# Patient Record
Sex: Female | Born: 1935 | Race: White | Hispanic: No | Smoking: Former smoker
Health system: Southern US, Community
[De-identification: ages and names within clinical notes are randomized; demographics above are authoritative.]

## PROBLEM LIST (undated history)

## (undated) DIAGNOSIS — H905 Unspecified sensorineural hearing loss: Secondary | ICD-10-CM

## (undated) DIAGNOSIS — N183 Chronic kidney disease, stage 3 (moderate): Secondary | ICD-10-CM

## (undated) DIAGNOSIS — Z96641 Presence of right artificial hip joint: Secondary | ICD-10-CM

## (undated) DIAGNOSIS — R634 Abnormal weight loss: Secondary | ICD-10-CM

## (undated) DIAGNOSIS — Z765 Malingerer [conscious simulation]: Secondary | ICD-10-CM

## (undated) DIAGNOSIS — K219 Gastro-esophageal reflux disease without esophagitis: Secondary | ICD-10-CM

## (undated) DIAGNOSIS — E43 Unspecified severe protein-calorie malnutrition: Secondary | ICD-10-CM

## (undated) DIAGNOSIS — M419 Scoliosis, unspecified: Secondary | ICD-10-CM

## (undated) DIAGNOSIS — J449 Chronic obstructive pulmonary disease, unspecified: Secondary | ICD-10-CM

## (undated) DIAGNOSIS — F99 Mental disorder, not otherwise specified: Secondary | ICD-10-CM

## (undated) DIAGNOSIS — Z9181 History of falling: Secondary | ICD-10-CM

## (undated) DIAGNOSIS — J45909 Unspecified asthma, uncomplicated: Secondary | ICD-10-CM

## (undated) DIAGNOSIS — S72001D Fracture of unspecified part of neck of right femur, subsequent encounter for closed fracture with routine healing: Secondary | ICD-10-CM

## (undated) DIAGNOSIS — F32A Depression, unspecified: Secondary | ICD-10-CM

## (undated) DIAGNOSIS — F329 Major depressive disorder, single episode, unspecified: Secondary | ICD-10-CM

## (undated) DIAGNOSIS — R1310 Dysphagia, unspecified: Secondary | ICD-10-CM

## (undated) DIAGNOSIS — H3530 Unspecified macular degeneration: Secondary | ICD-10-CM

## (undated) DIAGNOSIS — N184 Chronic kidney disease, stage 4 (severe): Secondary | ICD-10-CM

## (undated) DIAGNOSIS — E559 Vitamin D deficiency, unspecified: Secondary | ICD-10-CM

## (undated) DIAGNOSIS — Z9889 Other specified postprocedural states: Secondary | ICD-10-CM

## (undated) DIAGNOSIS — R42 Dizziness and giddiness: Secondary | ICD-10-CM

## (undated) DIAGNOSIS — H409 Unspecified glaucoma: Secondary | ICD-10-CM

## (undated) DIAGNOSIS — E539 Vitamin B deficiency, unspecified: Secondary | ICD-10-CM

## (undated) DIAGNOSIS — G4700 Insomnia, unspecified: Secondary | ICD-10-CM

## (undated) DIAGNOSIS — S022XXA Fracture of nasal bones, initial encounter for closed fracture: Secondary | ICD-10-CM

## (undated) DIAGNOSIS — F419 Anxiety disorder, unspecified: Secondary | ICD-10-CM

## (undated) DIAGNOSIS — G43909 Migraine, unspecified, not intractable, without status migrainosus: Secondary | ICD-10-CM

## (undated) DIAGNOSIS — K6289 Other specified diseases of anus and rectum: Secondary | ICD-10-CM

## (undated) DIAGNOSIS — M810 Age-related osteoporosis without current pathological fracture: Secondary | ICD-10-CM

## (undated) DIAGNOSIS — K529 Noninfective gastroenteritis and colitis, unspecified: Secondary | ICD-10-CM

## (undated) DIAGNOSIS — F1910 Other psychoactive substance abuse, uncomplicated: Secondary | ICD-10-CM

## (undated) DIAGNOSIS — Z8781 Personal history of (healed) traumatic fracture: Secondary | ICD-10-CM

## (undated) DIAGNOSIS — K222 Esophageal obstruction: Secondary | ICD-10-CM

## (undated) DIAGNOSIS — I771 Stricture of artery: Secondary | ICD-10-CM

## (undated) HISTORY — DX: Unspecified severe protein-calorie malnutrition: E43

## (undated) HISTORY — DX: Anxiety disorder, unspecified: F41.9

## (undated) HISTORY — DX: Chronic kidney disease, stage 3 (moderate): N18.3

## (undated) HISTORY — DX: Vitamin D deficiency, unspecified: E55.9

## (undated) HISTORY — DX: Age-related osteoporosis without current pathological fracture: M81.0

## (undated) HISTORY — DX: Mental disorder, not otherwise specified: F99

## (undated) HISTORY — DX: Gastro-esophageal reflux disease without esophagitis: K21.9

## (undated) HISTORY — DX: Unspecified glaucoma: H40.9

## (undated) HISTORY — DX: Insomnia, unspecified: G47.00

## (undated) HISTORY — DX: Other specified diseases of anus and rectum: K62.89

## (undated) HISTORY — DX: Depression, unspecified: F32.A

## (undated) HISTORY — DX: History of falling: Z91.81

## (undated) HISTORY — DX: Unspecified sensorineural hearing loss: H90.5

## (undated) HISTORY — DX: Scoliosis, unspecified: M41.9

## (undated) HISTORY — DX: Abnormal weight loss: R63.4

## (undated) HISTORY — DX: Migraine, unspecified, not intractable, without status migrainosus: G43.909

## (undated) HISTORY — DX: Fracture of unspecified part of neck of right femur, subsequent encounter for closed fracture with routine healing: S72.001D

## (undated) HISTORY — PX: CATARACT EXTRACTION: SUR2

## (undated) HISTORY — DX: Vitamin B deficiency, unspecified: E53.9

## (undated) HISTORY — DX: Personal history of (healed) traumatic fracture: Z87.81

## (undated) HISTORY — DX: Malingerer (conscious simulation): Z76.5

## (undated) HISTORY — DX: Other specified postprocedural states: Z98.890

## (undated) HISTORY — PX: ABDOMINAL HYSTERECTOMY: SHX81

## (undated) HISTORY — DX: Unspecified macular degeneration: H35.30

## (undated) HISTORY — DX: Other psychoactive substance abuse, uncomplicated: F19.10

## (undated) HISTORY — DX: Major depressive disorder, single episode, unspecified: F32.9

## (undated) HISTORY — PX: CHOLECYSTECTOMY: SHX55

## (undated) HISTORY — DX: Presence of right artificial hip joint: Z96.641

## (undated) HISTORY — PX: PARTIAL HYSTERECTOMY: SHX80

## (undated) HISTORY — DX: Chronic obstructive pulmonary disease, unspecified: J44.9

---

## 1997-06-11 ENCOUNTER — Other Ambulatory Visit: Admission: RE | Admit: 1997-06-11 | Discharge: 1997-06-11 | Payer: Self-pay | Admitting: Family Medicine

## 1997-06-21 ENCOUNTER — Other Ambulatory Visit: Admission: RE | Admit: 1997-06-21 | Discharge: 1997-06-21 | Payer: Self-pay | Admitting: Family Medicine

## 1998-07-26 ENCOUNTER — Other Ambulatory Visit: Admission: RE | Admit: 1998-07-26 | Discharge: 1998-07-26 | Payer: Self-pay | Admitting: Family Medicine

## 2000-05-03 ENCOUNTER — Encounter: Admission: RE | Admit: 2000-05-03 | Discharge: 2000-05-03 | Payer: Self-pay | Admitting: Family Medicine

## 2000-05-03 ENCOUNTER — Encounter: Payer: Self-pay | Admitting: Family Medicine

## 2000-05-21 ENCOUNTER — Other Ambulatory Visit: Admission: RE | Admit: 2000-05-21 | Discharge: 2000-05-21 | Payer: Self-pay | Admitting: *Deleted

## 2001-09-05 ENCOUNTER — Encounter: Admission: RE | Admit: 2001-09-05 | Discharge: 2001-09-05 | Payer: Self-pay | Admitting: Endocrinology

## 2001-09-05 ENCOUNTER — Encounter: Payer: Self-pay | Admitting: Endocrinology

## 2002-03-25 ENCOUNTER — Encounter: Admission: RE | Admit: 2002-03-25 | Discharge: 2002-06-23 | Payer: Self-pay | Admitting: Dermatology

## 2002-06-09 ENCOUNTER — Encounter: Admission: RE | Admit: 2002-06-09 | Discharge: 2002-06-09 | Payer: Self-pay | Admitting: Sports Medicine

## 2002-06-09 ENCOUNTER — Encounter: Payer: Self-pay | Admitting: Sports Medicine

## 2002-06-23 ENCOUNTER — Encounter: Admission: RE | Admit: 2002-06-23 | Discharge: 2002-06-23 | Payer: Self-pay | Admitting: Sports Medicine

## 2002-06-23 ENCOUNTER — Encounter: Payer: Self-pay | Admitting: Sports Medicine

## 2002-07-29 ENCOUNTER — Encounter: Admission: RE | Admit: 2002-07-29 | Discharge: 2002-07-29 | Payer: Self-pay | Admitting: Sports Medicine

## 2002-07-29 ENCOUNTER — Encounter: Payer: Self-pay | Admitting: Sports Medicine

## 2003-02-28 ENCOUNTER — Emergency Department (HOSPITAL_COMMUNITY): Admission: EM | Admit: 2003-02-28 | Discharge: 2003-02-28 | Payer: Self-pay | Admitting: Emergency Medicine

## 2003-08-04 ENCOUNTER — Encounter: Admission: RE | Admit: 2003-08-04 | Discharge: 2003-08-04 | Payer: Self-pay | Admitting: Internal Medicine

## 2005-03-04 ENCOUNTER — Emergency Department (HOSPITAL_COMMUNITY): Admission: EM | Admit: 2005-03-04 | Discharge: 2005-03-04 | Payer: Self-pay | Admitting: Emergency Medicine

## 2006-04-29 ENCOUNTER — Emergency Department (HOSPITAL_COMMUNITY): Admission: EM | Admit: 2006-04-29 | Discharge: 2006-04-29 | Payer: Self-pay | Admitting: Emergency Medicine

## 2006-11-18 ENCOUNTER — Inpatient Hospital Stay (HOSPITAL_COMMUNITY): Admission: EM | Admit: 2006-11-18 | Discharge: 2006-11-20 | Payer: Self-pay | Admitting: Emergency Medicine

## 2006-11-30 ENCOUNTER — Observation Stay (HOSPITAL_COMMUNITY): Admission: EM | Admit: 2006-11-30 | Discharge: 2006-12-01 | Payer: Self-pay | Admitting: Emergency Medicine

## 2007-03-27 ENCOUNTER — Encounter: Admission: RE | Admit: 2007-03-27 | Discharge: 2007-03-27 | Payer: Self-pay | Admitting: Internal Medicine

## 2007-04-05 ENCOUNTER — Emergency Department (HOSPITAL_COMMUNITY): Admission: EM | Admit: 2007-04-05 | Discharge: 2007-04-05 | Payer: Self-pay | Admitting: Emergency Medicine

## 2008-04-09 ENCOUNTER — Emergency Department (HOSPITAL_COMMUNITY): Admission: EM | Admit: 2008-04-09 | Discharge: 2008-04-09 | Payer: Self-pay | Admitting: Emergency Medicine

## 2009-02-27 ENCOUNTER — Emergency Department (HOSPITAL_COMMUNITY): Admission: EM | Admit: 2009-02-27 | Discharge: 2009-02-27 | Payer: Self-pay | Admitting: Emergency Medicine

## 2009-03-08 ENCOUNTER — Encounter: Admission: RE | Admit: 2009-03-08 | Discharge: 2009-03-08 | Payer: Self-pay | Admitting: Emergency Medicine

## 2009-04-03 ENCOUNTER — Emergency Department (HOSPITAL_COMMUNITY): Admission: EM | Admit: 2009-04-03 | Discharge: 2009-04-03 | Payer: Self-pay | Admitting: Emergency Medicine

## 2009-04-05 ENCOUNTER — Emergency Department (HOSPITAL_COMMUNITY): Admission: EM | Admit: 2009-04-05 | Discharge: 2009-04-05 | Payer: Self-pay | Admitting: Emergency Medicine

## 2009-06-10 ENCOUNTER — Emergency Department (HOSPITAL_COMMUNITY): Admission: EM | Admit: 2009-06-10 | Discharge: 2009-06-10 | Payer: Self-pay | Admitting: Family Medicine

## 2009-06-21 ENCOUNTER — Emergency Department (HOSPITAL_COMMUNITY): Admission: EM | Admit: 2009-06-21 | Discharge: 2009-06-21 | Payer: Self-pay | Admitting: Emergency Medicine

## 2010-03-05 ENCOUNTER — Encounter: Payer: Self-pay | Admitting: Internal Medicine

## 2010-04-30 LAB — URINALYSIS, ROUTINE W REFLEX MICROSCOPIC
Glucose, UA: NEGATIVE mg/dL
Hgb urine dipstick: NEGATIVE
Ketones, ur: NEGATIVE mg/dL
Nitrite: NEGATIVE
Protein, ur: NEGATIVE mg/dL
pH: 6.5 (ref 5.0–8.0)

## 2010-04-30 LAB — DIFFERENTIAL
Basophils Absolute: 0 10*3/uL (ref 0.0–0.1)
Eosinophils Absolute: 0.1 10*3/uL (ref 0.0–0.7)
Eosinophils Relative: 2 % (ref 0–5)
Lymphocytes Relative: 12 % (ref 12–46)
Lymphs Abs: 0.9 10*3/uL (ref 0.7–4.0)
Monocytes Relative: 9 % (ref 3–12)
Neutro Abs: 5.7 10*3/uL (ref 1.7–7.7)
Neutrophils Relative %: 77 % (ref 43–77)

## 2010-04-30 LAB — CBC
Hemoglobin: 14.8 g/dL (ref 12.0–15.0)
RBC: 4.68 MIL/uL (ref 3.87–5.11)
WBC: 7.4 10*3/uL (ref 4.0–10.5)

## 2010-04-30 LAB — BASIC METABOLIC PANEL
GFR calc non Af Amer: >60 mL/min (ref >60)
Glucose, Bld: 92 mg/dL (ref 70–99)
Potassium: 3.9 mEq/L (ref 3.5–5.1)
Sodium: 141 mEq/L (ref 135–145)

## 2010-05-03 LAB — POCT I-STAT, CHEM 8
BUN: 22 mg/dL (ref 6–23)
Creatinine, Ser: 0.8 mg/dL (ref 0.4–1.2)
HCT: 43 % (ref 36.0–46.0)
Hemoglobin: 14.6 g/dL (ref 12.0–15.0)
Potassium: 4.1 mEq/L (ref 3.5–5.1)
Sodium: 139 mEq/L (ref 135–145)

## 2010-05-30 LAB — POCT CARDIAC MARKERS
CKMB, poc: <1 ng/mL — ABNORMAL LOW (ref 1.0–8.0)
Myoglobin, poc: 45.3 ng/mL (ref 12–200)

## 2010-05-30 LAB — DIFFERENTIAL
Basophils Absolute: 0 10*3/uL (ref 0.0–0.1)
Eosinophils Absolute: 0.1 10*3/uL (ref 0.0–0.7)
Eosinophils Relative: 1 % (ref 0–5)
Neutro Abs: 6.9 10*3/uL (ref 1.7–7.7)
Neutrophils Relative %: 75 % (ref 43–77)

## 2010-05-30 LAB — CBC
HCT: 39.5 % (ref 36.0–46.0)
Platelets: 137 10*3/uL — ABNORMAL LOW (ref 150–400)
RDW: 13.8 % (ref 11.5–15.5)

## 2010-05-30 LAB — POCT I-STAT, CHEM 8
BUN: 28 mg/dL — ABNORMAL HIGH (ref 6–23)
Calcium, Ion: 1.08 mmol/L — ABNORMAL LOW (ref 1.12–1.32)
TCO2: 27 mmol/L (ref 0–100)

## 2010-06-27 NOTE — Discharge Summary (Signed)
[name], [name]:  [id number]   MEDICAL RECORD NO.:  [id number]          PATIENT TYPE:  INP   LOCATION:  [id number]                         FACILITY:  [facility]   PHYSICIAN:  Rexene Alberts, M.D.    DATE OF BIRTH:  [birth year]   DATE OF ADMISSION:  11/18/2006  DATE OF DISCHARGE:  11/20/2006                               DISCHARGE SUMMARY   DISCHARGE DIAGNOSES:  1. Acute gastroenteritis.  2. Hypovolemic shock.  3. Fever, resolved during the hospitalization.  4. Reported history of episodic rectal bleeding although mild.  5. Acute renal insufficiency.  6. Polypharmacy.   DISCHARGE MEDICATIONS:  1. Cipro 500 mg b.i.d. for 3 days.  2. Vicoprofen 7.5/200 mg b.i.d. as needed (do not take if you are      taking tramadol and Ascomp with codeine).  3. Tranxene 7.5 mg t.i.d. p.r.n.  4. Lidoderm patch 5% add to affected skin for 12 hours and then off      for 12 hours.  5. Tramadol 50 mg every 6 hours as needed (do not take if you are      Vicoprofen and Ascomp with codeine).  6. Combivent MDI 2 puffs q.i.d. p.r.n.  7. Nitroglycerin 0.4 mg sublingual p.r.n.  8. Lasix 20 mg p.r.n.  9. Triazolam 0.25 mg p.r.n. (do not take if your taking Remeron and      Surmontil).  10.Zovirax 5% topical to the skin as needed.  11.Depo-Estradiol 5 mg monthly injection.  12.Surmontil 50 mg once daily or b.i.d. (decreased from t.i.d.).  13.Allegra 60 mg daily as needed.  14.Flovent inhaler 2 puffs as needed.  15.Remeron 45 mg daily.  16.Zantac 150 mg b.i.d.  17.Ascomp with codeine as needed (do not take if you are taking      Vicoprofen and tramadol).  18.Wellbutrin 150 mg daily.  19.Phenergan 25 mg every 4-6 hours as needed.  20.Vigamox eyedrops 0.5% 1 drop in the left eye t.i.d.  21.Omnipred 0.1% eye 1 drop in the left eye t.i.d.  22.Xibrom eyedrops 0.09% 1 drop in the left eye t.i.d.   DISCHARGE DISPOSITION:  The patient was discharged to home in improved  and stable condition  on November 20, 2006.  She was advised to follow up  with her primary care physician, Dr. Mellody Drown, in 5-7 days.   CONSULTATIONS:  None.   PROCEDURE PERFORMED:  1. Acute abdominal series on November 18, 2006.  The results revealed a      nonspecific bowel gas pattern without evidence for obstruction.      Air-filled loops of small bowel may reflect enteritis.  2. Chest x-ray:  The results revealed no evidence for pneumonia.      COPD/emphysema.   HISTORY OF PRESENT ILLNESS:  The patient is a 77 year old woman with a  past medical history significant for COPD, chronic pain, depression, and  osteoporosis.  She presented to the emergency department on November 18, 2006 with a chief complaint of nausea, vomiting, fever, chills, and  generalized malaise.  She noted that she had eaten at [facility] earlier in the day.  She vomited a large amount of emesis in a  trash can at home.  When the patient arrived to the emergency  department, she was noted to be febrile with a temperature of 102.8.  The patient was therefore admitted for further evaluation and  management.   For additional details please see the dictated history and physical.   HOSPITAL COURSE:  #1.  ACUTE GASTROENTERITIS/HYPOVOLEMIC SHOCK.  At the  time of the initial hospital assessment, the patient's blood pressure  was 100/64.  However, her blood pressure fell into the 123XX123 systolically  overnight.  She was transferred to the ICU and started on dopamine.  Blood cultures were ordered and she was started on Flagyl and Cipro  empirically. In addition, an urinalysis and subsequently an urine  culture were both ordered.  The urinalysis revealed no evidence of  infection.  The urine culture has been negative thus far.  As previously  stated, blood cultures were ordered and as of today, on hospital day #3,  they have been negative.   After approximately 24 hours on dopamine, the patient's blood pressure  stabilized into the  low 100s.  The dopamine was titrated off and the  patient was continued on IV fluids for blood pressure support.  It is  also important to note, that the patient had been started on Flagyl and  Rocephin.  However within 24 hours of starting these antibiotics, they  were discontinued. As stated above, Cipro and then vancomycin were  initiated instead.  Given the patient's hypotension, her TSH, and a  random blood cortisol were both assessed.  Her TSH was within normal  limits at 0.391. Her free T4 was 0.88. Her blood cortisol was 5.5 in the  afternoon repeated later at 16.0, both within normal limits.   On hospital day #2 and #3, the patient felt symptomatically better and  she appeared to be clinically improved.  A clear liquid diet was started  followed by a heart healthy diet. The patient not only tolerated the  clear liquid diet but she tolerated the heart healthy diet as well.  She  requests to go home today.  Currently her blood pressure is stable,  ranging from the upper 90s to low 100s.  She ambulated with the nursing  staff without any complaints of dizziness.  She has had no further  nausea or vomiting during the hospital course, particularly following  intake of solids.  The patient will remain on empiric antibiotic  treatment for an additional 3 days namely Cipro 500 mg b.i.d. In  addition, the patient was advised to take Phenergan 25 mg as needed  every 6 hours.   #2.  POLYPHARMACY. The patient takes many medications, although she  states that most of them she takes as needed.  There is certainly a risk  for drug drug interaction with the number of medications she takes.  For  instance, she takes a medication with codeine as needed as well as  tramadol, and as needed by Vicoprofen. The patient was advised not to  take all of these medications simultaneously or even in the same day.  She was also advised to decrease the Surmontil to daily or b.i.d.  instead of t.i.d.  This  recommendation was given because the patient is  also  currently taking Wellbutrin and Remeron.  She was also advised to  discontinue Triazolam as she is also taking Tranxene.  The patient was  strongly advised to discuss these multiple medications and potential  drug drug interactions with her primary care physician, Dr. Mellody Drown. She  voiced understanding.      Rexene Alberts, M.D.  Electronically Signed     DF/MEDQ  D:  11/20/2006  T:  11/21/2006  Job:  [id number]   cc:   Jilda Panda, M.D.  Fax: [phone]

## 2010-06-27 NOTE — H&P (Signed)
Taylor Roth, [name]:  [id number]   MEDICAL RECORD NO.:  [id number]          PATIENT TYPE:  OBV   LOCATION:  1534                         FACILITY:  [facility]   PHYSICIAN:  Leana Gamer, MDDATE OF BIRTH:  [birth year]   DATE OF ADMISSION:  11/30/2006  DATE OF DISCHARGE:  12/01/2006                              HISTORY & PHYSICAL   ,   CHIEF COMPLAINT:  Nightly sweats and feeling poorly.   HISTORY OF PRESENT ILLNESS:  This is a 77 year old lady who presented to  the emergency room with complaints of sweats at night which precipitated  her coming to the emergency room.  The patient states she has no  dizziness, no fever, no nausea, vomiting or diarrhea.  The patient was  evaluated by the emergency room physician, Dr. Tomi Bamberger, whois  assymptomatic but was found to be orthostatic and advised the patient  that she needed to be admitted.  I am here to see the patient.  The  patient has several medications that can cause orthostasis, which is  likely the cause and probably does not require admission to the  hospital, however, in terms of the patient's sweats, she states that she  has been seen by her primary care physician, Dr. Mellody Drown, who advised  her that it is likely due to the fact that she is due for her Depo-  Estradiol.  However, the patient and her daughter have been advised by  the emergency room physician that she requires admission and the  patient's daughter is insisting that she be admitted, thus per patient's  insistence and family's refusal to take the patient home, she is being  admission on an observation basis.   PAST MEDICAL HISTORY:  Significant for osteoporosis, depression, chronic  obstructive pulmonary disease, chronic pain and allergic rhinitis.   SOCIAL HISTORY:  The patient lives alone.  She denies any tobacco,  alcohol or drug use.   CURRENT MEDICATIONS:  Her current medications include the following:  1. Surmontil 50 mg p.o.  t.i.d.  2. Depo-Estradiol 5 mg as scheduled by primary care physician.  3. Remeron 45 mg p.o. q.h.s.  4. Zantac 150 mg p.o. b.i.d.  5. Ascomp with Codeine p.r.n. for migraines.  6. Lactulose 10 mg p.o. daily.  7. Wellbutrin 150 mg p.o. daily.  8. Vigamox 0.5% one drop to left eye t.i.d.  9. Omnipred 0.1% drops, one drop to each eye t.i.d.  10.Xibrom 0.09% one drop to left eye t.i.d.  11.Vicoprofen 7.5/200 p.o. b.i.d.  12.Tranxene T 7.5 mg p.o. q.i.d.  13.Combivent two puffs daily p.r.n.  14.Nitroglycerin 0.4 mg sublingual q.5 minutes x3 p.r.n.  15.Lasix 20 mg daily p.r.n.  16.Halcion 0.25 mg p.o. q.h.s.  17.Allegra 60 mg p.o. t.i.d.   ALLERGIES:  OXYCODONE, PENICILLIN, SULFONAMIDES.   PRIMARY CARE PHYSICIAN:  Lauraine Rinne, M.D.   REVIEW OF SYSTEMS:  Fourteen systems review:  All systems are negative  except as noted in the HPI.   PHYSICAL EXAMINATION:  GENERAL APPEARANCE:  The patient is lying in bed.  She appears in no acute distress and actually well-appearing.  The  patient has no complaints at this time.  VITAL SIGNS:  Orthostatic's:  In a supine position blood pressure is  104/61, heart rate 67;  in a sitting position the blood pressure is  100/57, heart rate 68;  in standing position the blood pressure is 99/60  and heart rate is 72.  HEENT:  Patient is normocephalic, atraumatic.  Pupils equal, round,  reactive to light and accommodation.  Extraocular movements are intact.  Tympanic membranes are translucent bilaterally with good landmarks.  Oropharynx is moist and no exudate, erythema or lesions are noted.  NECK:  Trachea is midline.  No masses.  No thyromegaly.  No jugular  venous distention.  No carotid bruits.  RESPIRATORY EXAM:  The patient has a normal respiratory effort; equal  excursion bilaterally.  She has no wheezing or rhonchi noted.  CARDIOVASCULAR:  She has normal S1 and S2. No murmurs, rubs or gallops  noted.  PMI is nondisplaced.  No heaves or  thrills on palpation.  ABDOMEN:  On exam the abdomen is obese, soft, nontender,  nondistended.  No masses, no hepatosplenomegaly noted.  LYMPH NODES SURVEY:  She has no cervical, axillary, inguinal  lymphadenopathy noted.  NEUROLOGICAL:  Patient has  no focal neurological deficits.  Cranial  nerves II-XII are grossly intact.  Strength is symmetrical, 4/5 in  bilateral upper and lower extremities.  Deep tendon reflexes are  symmetrical, 2+ bilaterally in upper and lower extremities.  Sensation  is intact to light touch, pinprick and proprioception.  MUSCULOSKELETAL:  She has no warmness, swelling or erythema around the  joints.  No spinal tenderness noted.  PSYCHIATRIC:  She is alert and oriented x3.  Good insight and cognition.  Good recent and remote recall.   ASSESSMENT/PLAN:  This patient presents with orthostasis, likely  medication effect.  It appears that the orthostasis does not effect the  patient functionally.  However, the patient's family is insistent, at  the advice of the emergency room physician, that she be admitted to the  hospital, thus the patient is being brought in on an outpatient basis.  For this patient, at this time, I am going to hold the medications that  predispose the patient to orthostatic hypotension, which include  Tranxene, Surmontil and Remeron.  She will be continued on her usual  medications.      Leana Gamer, MD  Electronically Signed     MAM/MEDQ  D:  11/30/2006  T:  12/02/2006  Job:  [id number]   cc:   Lauraine Rinne, MD

## 2010-06-27 NOTE — H&P (Signed)
Taylor Roth, SHUFFORD NO.:  [id number]   MEDICAL RECORD NO.:  [id number]          PATIENT TYPE:  INP   LOCATION:  1226                         FACILITY:  [facility]   PHYSICIAN:  Jana Hakim, M.D. DATE OF BIRTH:  1928/09/12   DATE OF ADMISSION:  11/18/2006  DATE OF DISCHARGE:                              HISTORY & PHYSICAL   PRIMARY CARE PHYSICIAN:  Dr. Jilda Panda.   CHIEF COMPLAINT:  Fever, nausea, vomiting.   HISTORY OF PRESENT ILLNESS:  This is a 77 year old female with a history  of COPD/emphysema presenting to the emergency department with complaints  of nausea, vomiting and fevers and chills which started at approximately  10:00 p.m. prior to admission.  She reports having an elevated fever and  was found at the time arrival in the emergency department to have a  temperature 102.8.  She denies having any abdominal pain and diarrhea.  Her daughter reports that patient had large volume vomiting into a trash  can.  She reports this material was food like.  The patient reports her  last meal was at noon, and this consisted of [employer],  slaw and mashed potatoes.  She also reports drinking a can of Ensure  Plus later in the afternoon.  The patient reports having an episode of  rectal bleeding in the a.m. and reports this occurs off and on but not  consistently.   PAST MEDICAL HISTORY:  Significant for:  1. Emphysema.  2. Osteoporosis.  3. Peptic ulcer disease.  4. Hiatal hernia.   PAST SURGICAL HISTORY:  1. History of cholecystectomy.  2. Total abdominal hysterectomy.  3. Cataract surgery of the left eye.   MEDICATIONS:  Include:  1. Vicoprofen 1 p.o. q.6h. p.r.n.  2. Tranxene 7.5 mg 1 p.o. q.i.d.  3. Tramadol 1 p.o. t.i.d. p.r.n.  4. Triazolam 0.25 mg 1 p.o. every night.  5. Combivent inhaler b.i.d. p.r.n.  6. Surmontil 50 mg 1 p.o. t.i.d.  7. Allegra 60 mg 1 p.o. t.i.d.  8. Flovent inhaler 110 mcg 1 inhalation b.i.d.  9. Remeron  45 mg 1 p.o. every night.  10.Zantac 150 mg 1 p.o. q.a.m.  11.Ascomp with codeine 1 p.o. q.i.d. p.r.n.  12.Lactulose 15 mL p.o. every night.  13.Wellbutrin 150 mg 1 p.o. daily.  14.Xopenex inhaler 2 puffs q.i.d. p.r.n.  15.Zovirax 5% ointments p.r.n.  16.Nitroglycerin sublingual 0.4 mg every 5 minutes x3 p.r.n. chest      pain.  17.Lidoderm patch 5% p.r.n. pain.  18.B12 shots monthly.  19.Depo-Estradiol injections 5 mg monthly.   ALLERGIES:  TO PENICILLIN AND SULFA WHICH BOTH CAUSE RASHES.   SOCIAL HISTORY:  The patient lives alone.  Daughter is nearby.  Positive  tobacco, smoked 1 pack per day for 40 years.  No history of alcohol.   FAMILY HISTORY:  Is noncontributory.   REVIEW OF SYSTEMS:  Pertinents are mentioned above.  The patient denies  having any melena, any syncope.  She does report having weakness.  Denies dizziness.   PHYSICAL EXAMINATION FINDINGS:  This is an ill, elderly-appearing, 54-  year-old, thin female in  discomfort but no acute distress.  Temperature  is 102.8, blood pressure 100/64; however, this did decrease to systolic  of 123456, heart rate 103 initially, but this ranged from 68-103,  respirations 28-24, O2 saturations 96-98%.  HEENT EXAMINATION:  Normocephalic, atraumatic.  There is no scleral  icterus.  Pupils are equally round, reactive to light.  Extraocular  muscles are intact.  Funduscopic benign.  Oropharynx clear.  NECK:  Is supple.  Full range of motion.  No thyromegaly, adenopathy,  jugular venous distention.  CARDIOVASCULAR:  Regular rate and rhythm.  No murmurs, gallops or rubs.  LUNGS:  Clear to auscultation bilaterally.  ABDOMEN:  Positive bowel sounds, soft, nontender, nondistended.  EXTREMITIES:  Without cyanosis, clubbing or edema.  NEUROLOGIC EXAMINATION:  Mild generalized weakness.  The patient is  alert and oriented x3.  There are no focal deficits.   LABORATORY STUDIES:  White blood cell count 12.4, hemoglobin 14.6,  hematocrit  43.1, MCV 93.5, platelets 189, neutrophils 82%, lymphocytes  11%.  Sodium 140, potassium 3.3, chloride 103, carbon dioxide 29, BUN  24, creatinine 0.81, glucose 97, calcium level 8.4.  Chest x-ray reveals  COPD changes.  Urinalysis negative.   ASSESSMENT:  A 77 year old female being admitted with:  1. Acute gastroenteritis.  2. Hypotension.  3. Dehydration.  4. Febrile illness.  5. Mild hypokalemia.  6. Episodic rectal bleeding.   PLAN:  The patient will be admitted to a telemetry area for cardiac  monitoring.  She will be placed on IV fluids for fluid resuscitation and  rehydration.  Blood cultures have been sent, and antiemetics have been  administered.  They will also be ordered p.r.n.  Potassium will be  replaced.  The patient had been administered Rocephin 1 gram IV in the  emergency department as empiric treatment.  Ciprofloxacin and Flagyl  have been ordered instead.  The patient will have abdominal x-rays  performed, a 3-way abdomen, to look for evidence of an ileus or signs of  a obstruction.  GI prophylaxis has been ordered, and TED hose has been ordered for DVT  prophylaxis.  The patient's hemoglobin and hematocrit levels will be  monitored, and a GI consultation will be considered whether to be  performed as an inpatient or as an outpatient.      Jana Hakim, M.D.  Electronically Signed     HJ/MEDQ  D:  11/18/2006  T:  11/19/2006  Job:  [id number]   cc:   Jilda Panda, M.D.  Fax: [phone]

## 2010-08-10 ENCOUNTER — Other Ambulatory Visit: Payer: Self-pay | Admitting: Emergency Medicine

## 2010-08-24 ENCOUNTER — Other Ambulatory Visit: Payer: Self-pay

## 2010-11-22 LAB — COMPREHENSIVE METABOLIC PANEL
ALT: 11
AST: 24
Alkaline Phosphatase: 58
CO2: 27
Chloride: 104
GFR calc Af Amer: >60
Potassium: 4
Sodium: 140
Total Bilirubin: 0.4

## 2010-11-22 LAB — URINALYSIS, ROUTINE W REFLEX MICROSCOPIC
Bilirubin Urine: NEGATIVE
Glucose, UA: NEGATIVE
Hgb urine dipstick: NEGATIVE
Ketones, ur: NEGATIVE
Nitrite: NEGATIVE

## 2010-11-22 LAB — POCT CARDIAC MARKERS: CKMB, poc: <1 — ABNORMAL LOW

## 2010-11-22 LAB — DIFFERENTIAL
Basophils Absolute: 0.1
Basophils Relative: 2 — ABNORMAL HIGH
Eosinophils Absolute: 0.1
Eosinophils Relative: 2
Lymphocytes Relative: 32

## 2010-11-22 LAB — CBC
HCT: 38.1
Platelets: 247
RBC: 4.1
WBC: 5.5

## 2010-11-22 LAB — URINE MICROSCOPIC-ADD ON

## 2010-11-23 LAB — DIFFERENTIAL
Basophils Absolute: 0
Basophils Absolute: 0
Basophils Absolute: 0.1
Basophils Relative: 0
Basophils Relative: 0
Eosinophils Absolute: 0.1
Eosinophils Absolute: 0.1
Eosinophils Absolute: 0.1
Eosinophils Relative: 1
Eosinophils Relative: 1
Lymphocytes Relative: 11 — ABNORMAL LOW
Lymphocytes Relative: 22
Lymphs Abs: 1.4
Monocytes Absolute: 0.7
Monocytes Absolute: 0.8 — ABNORMAL HIGH
Neutro Abs: 10.2 — ABNORMAL HIGH
Neutrophils Relative %: 73

## 2010-11-23 LAB — BASIC METABOLIC PANEL
CO2: 22
CO2: 29
Calcium: 7.8 — ABNORMAL LOW
Calcium: 8.4
Chloride: 103
Chloride: 115 — ABNORMAL HIGH
Creatinine, Ser: 0.81
GFR calc Af Amer: >60
Glucose, Bld: 97
Potassium: 3.5
Sodium: 140
Sodium: 142

## 2010-11-23 LAB — CBC
HCT: 33.6 — ABNORMAL LOW
HCT: 35.7 — ABNORMAL LOW
HCT: 35.9 — ABNORMAL LOW
Hemoglobin: 11.7 — ABNORMAL LOW
Hemoglobin: 12.2
Hemoglobin: 14.6
MCHC: 33.8
MCHC: 34
MCHC: 34.8
MCV: 92.8
MCV: 93.5
MCV: 93.5
MCV: 93.8
Platelets: 180
Platelets: 193
RBC: 3.59 — ABNORMAL LOW
RBC: 4.61
RDW: 14.7 — ABNORMAL HIGH
RDW: 15.4 — ABNORMAL HIGH
RDW: 15.5 — ABNORMAL HIGH

## 2010-11-23 LAB — HEMOGLOBIN AND HEMATOCRIT, BLOOD
HCT: 32.8 — ABNORMAL LOW
Hemoglobin: 11.3 — ABNORMAL LOW
Hemoglobin: 12.3

## 2010-11-23 LAB — COMPREHENSIVE METABOLIC PANEL
ALT: 16
Alkaline Phosphatase: 53
BUN: 5 — ABNORMAL LOW
CO2: 25
Calcium: 7.4 — ABNORMAL LOW
GFR calc non Af Amer: >60
Glucose, Bld: 127 — ABNORMAL HIGH
Sodium: 140

## 2010-11-23 LAB — CULTURE, BLOOD (ROUTINE X 2): Culture: NO GROWTH

## 2010-11-23 LAB — URINE CULTURE
Colony Count: NO GROWTH
Special Requests: NEGATIVE

## 2010-11-23 LAB — CORTISOL: Cortisol, Plasma: 4.4

## 2010-11-23 LAB — URINALYSIS, ROUTINE W REFLEX MICROSCOPIC
Nitrite: NEGATIVE
Specific Gravity, Urine: 1.019
Urobilinogen, UA: 0.2
pH: 6

## 2010-11-23 LAB — CARDIAC PANEL(CRET KIN+CKTOT+MB+TROPI)
Relative Index: INVALID
Troponin I: 0.03

## 2010-11-23 LAB — MAGNESIUM: Magnesium: 1.7

## 2010-11-23 LAB — T4, FREE: Free T4: 0.88 — ABNORMAL LOW

## 2010-11-23 LAB — TSH: TSH: 0.391

## 2011-02-20 ENCOUNTER — Ambulatory Visit (INDEPENDENT_AMBULATORY_CARE_PROVIDER_SITE_OTHER): Payer: Medicare Other | Admitting: Emergency Medicine

## 2011-02-20 DIAGNOSIS — D234 Other benign neoplasm of skin of scalp and neck: Secondary | ICD-10-CM

## 2011-02-20 DIAGNOSIS — D518 Other vitamin B12 deficiency anemias: Secondary | ICD-10-CM

## 2011-02-26 ENCOUNTER — Encounter: Payer: Self-pay | Admitting: *Deleted

## 2011-02-26 DIAGNOSIS — J449 Chronic obstructive pulmonary disease, unspecified: Secondary | ICD-10-CM

## 2011-02-26 DIAGNOSIS — G43909 Migraine, unspecified, not intractable, without status migrainosus: Secondary | ICD-10-CM

## 2011-02-26 DIAGNOSIS — M810 Age-related osteoporosis without current pathological fracture: Secondary | ICD-10-CM

## 2011-02-26 DIAGNOSIS — F329 Major depressive disorder, single episode, unspecified: Secondary | ICD-10-CM | POA: Insufficient documentation

## 2011-02-26 DIAGNOSIS — Z765 Malingerer [conscious simulation]: Secondary | ICD-10-CM

## 2011-02-26 DIAGNOSIS — F32A Depression, unspecified: Secondary | ICD-10-CM

## 2011-02-26 DIAGNOSIS — F419 Anxiety disorder, unspecified: Secondary | ICD-10-CM | POA: Insufficient documentation

## 2011-02-26 DIAGNOSIS — G4700 Insomnia, unspecified: Secondary | ICD-10-CM

## 2011-02-26 DIAGNOSIS — K219 Gastro-esophageal reflux disease without esophagitis: Secondary | ICD-10-CM

## 2011-02-26 HISTORY — DX: Malingerer (conscious simulation): Z76.5

## 2011-02-26 HISTORY — DX: Age-related osteoporosis without current pathological fracture: M81.0

## 2011-03-20 ENCOUNTER — Ambulatory Visit (INDEPENDENT_AMBULATORY_CARE_PROVIDER_SITE_OTHER): Payer: Medicare Other | Admitting: Emergency Medicine

## 2011-03-20 ENCOUNTER — Encounter: Payer: Self-pay | Admitting: Emergency Medicine

## 2011-03-20 VITALS — BP 130/67 | HR 71 | Temp 98.0°F | Resp 16 | Ht <= 58 in | Wt 80.6 lb

## 2011-03-20 DIAGNOSIS — D518 Other vitamin B12 deficiency anemias: Secondary | ICD-10-CM

## 2011-03-20 DIAGNOSIS — E538 Deficiency of other specified B group vitamins: Secondary | ICD-10-CM

## 2011-03-20 DIAGNOSIS — H5440 Blindness, one eye, unspecified eye: Secondary | ICD-10-CM

## 2011-03-20 DIAGNOSIS — F411 Generalized anxiety disorder: Secondary | ICD-10-CM

## 2011-03-20 DIAGNOSIS — R634 Abnormal weight loss: Secondary | ICD-10-CM

## 2011-03-20 DIAGNOSIS — R5381 Other malaise: Secondary | ICD-10-CM

## 2011-03-20 MED ORDER — CYANOCOBALAMIN 1000 MCG/ML IJ SOLN
1000.0000 ug | Freq: Once | INTRAMUSCULAR | Status: AC
  Administered 2011-03-20: 1000 ug via INTRAMUSCULAR

## 2011-03-21 NOTE — Progress Notes (Signed)
  Subjective:    Patient ID: Taylor Roth, female    DOB: 1928/05/04, 78 y.o.   MRN: [id number]  HPI patient comes in at my request to see about getting a letter regarding her need for extra Services at home she is blind in one eye with decreased visual acuity and the other. She is very high risk to fall. She needs help with essentially all her ADLs except for eating.    Review of Systems no change in her sent     Objective:   Physical Exam  physical exam reveals a debilitated female in no acute distress. Her lungs are clear heart is a regular rate and rhythm she has a severe scoliotic curve and walks with a limp.        Assessment & Plan:  Assessment is that of an extremely debilitated somewhat cachectic white female who is in need of extra services from the Program. [facility] write a letter on her behalf to see she can qualify for extra Services.Marland Kitchen

## 2011-03-22 ENCOUNTER — Telehealth: Payer: Self-pay

## 2011-03-22 NOTE — Telephone Encounter (Signed)
.  [facility] DOROTHY (THE CNA FOR [place]) STATES WE NEED TO CALL [employer] TO ORDER A FORM TO BE FILLED OUT IN ORDER FOR PT TO RECEIVE HER MEDICINE. PLEASE CALL HER AT [phone]  THE NUMBER TO CALL  [employer] IS [phone] HER REF # IS [id number]

## 2011-03-22 NOTE — Telephone Encounter (Signed)
Notified pt that forms have been filled out and faxed back for prior auth on triazolam and tranxene.

## 2011-03-22 NOTE — Telephone Encounter (Signed)
Ins faxing forms over for meds. Chart is at nurses station

## 2011-03-22 NOTE — Telephone Encounter (Signed)
Please pull chart and give to [facility] or TL.  Thanks!

## 2011-03-22 NOTE — Telephone Encounter (Signed)
SPOKE WITH DOROTHY SHE NEEDS PRIOR AUTHORIZATION FOR HER MEDICINE. THIS IS A NEW COMPANY AND THIS HAS TO BE DONE FOR THE TRIAZOLAM FOR INSURANCE TO CONTINUE TO PAY FOR THIS MED

## 2011-03-30 ENCOUNTER — Telehealth: Payer: Self-pay

## 2011-03-30 NOTE — Telephone Encounter (Signed)
Pt states we need to call [employer] to confirm that she needs brand name only for her tranzene (this may be the generic name but pt definitely does not want generic). [phone]

## 2011-04-02 ENCOUNTER — Telehealth: Payer: Self-pay

## 2011-04-02 NOTE — Telephone Encounter (Signed)
Please get a prescription form he blacked and right before [employer] of what she needs and then we will fax it to the pharmaceutical company she needs it fax 2.

## 2011-04-02 NOTE — Telephone Encounter (Signed)
Pt states she is receipt of a letter from dr daub. Pt requested the letter, but states it has incorrect information. Pt is currently receiving home health care from [facility], but would like to try and get care from [facility].  The letter states she is currently getting care from [facility], but she needs for the letter to state currently getting care form [facility]. Please call pt at [phone]

## 2011-04-03 NOTE — Telephone Encounter (Signed)
Asked [facility] to pull pts chart and put in Dr Perfecto Kingdom box

## 2011-04-04 NOTE — Telephone Encounter (Signed)
Called pt to tell her corrected letter is ready. Mailed her letter per pt request.

## 2011-04-04 NOTE — Telephone Encounter (Signed)
Taylor Roth please call patient in n happy to change the forms. If she will bring him back and I will make the correction. Please have a right on the front of the papers exactly what she needs me to change.

## 2011-04-09 ENCOUNTER — Telehealth: Payer: Self-pay

## 2011-04-09 NOTE — Telephone Encounter (Signed)
Pt states she received #270 of generic Tranxene from mail order. She has to have name brand only and didn't want any to be ordered by mail order. She was just checking on the prior auth that we were doing for the Tranxene to be name brand. Told pt I would check w/[employer] to see if prior Josem Kaufmann was approved and ask whether generic can be returned. Called [employer] and was told prior auths look like they were approved, but local pharm will have to call the retail helpdesk for an override at [phone]. Reported that pt can not return generic, but only cost pt $1.15. Called [facility] and  Gave them info to override so that pt can get name brand now bc she can not use the #270 generic they just sent, and aked pharmacist to make sure that all RFs on the Tranxene are name brand only. Pharmacy agreed. Called pt to explain status. Pt verbalized understanding.

## 2011-04-09 NOTE — Telephone Encounter (Signed)
Per patient, she has requested a refill on rx through mail order and states she recd the wrong rx. Per mail order comp, they sent what dr ordered. Patient would like to speak with someone regarding this problem.

## 2011-04-09 NOTE — Telephone Encounter (Signed)
Tried to call pt. Phone rang, no answer or VM, message came on that said "system on enter access code" and then got a fax beep.

## 2011-04-10 NOTE — Telephone Encounter (Signed)
This message was taken care of in 04/09/11 phone message

## 2011-04-17 ENCOUNTER — Ambulatory Visit (INDEPENDENT_AMBULATORY_CARE_PROVIDER_SITE_OTHER): Payer: Medicare Other | Admitting: Emergency Medicine

## 2011-04-17 DIAGNOSIS — D638 Anemia in other chronic diseases classified elsewhere: Secondary | ICD-10-CM | POA: Insufficient documentation

## 2011-04-17 DIAGNOSIS — D649 Anemia, unspecified: Secondary | ICD-10-CM

## 2011-04-17 MED ORDER — CYANOCOBALAMIN 1000 MCG/ML IJ SOLN: 1000.0000 ug | Freq: Once | INTRAMUSCULAR | Status: DC

## 2011-04-17 MED ORDER — CYANOCOBALAMIN 1000 MCG/ML IJ SOLN
1000.0000 ug | Freq: Once | INTRAMUSCULAR | Status: AC
  Administered 2011-04-17: 1000 ug via INTRAMUSCULAR

## 2011-04-17 NOTE — Progress Notes (Signed)
  Subjective:    Patient ID: Taylor Roth, female    DOB: [birth year], 78 y.o.   MRN: [id number]  HPI Patient here for a B12 shot only she was not examined.   Review of Systems     Objective:   Physical Exam        Assessment & Plan:

## 2011-05-07 ENCOUNTER — Telehealth: Payer: Self-pay

## 2011-05-07 NOTE — Telephone Encounter (Signed)
I refaxed PA forms and Rx that had originally been sent, with confirmation, on 03/22/11 and received confirmation that it had gone through again. Called pt to let her know we had gotten confirmation bf, but I have resent forms to [employer].

## 2011-05-07 NOTE — Telephone Encounter (Signed)
Pt called stating that her ins co says we need to send in form for her Tranzene 7.5 to be covered, but the pharmacy filled on 04/09/11 without any trouble. Told pt that we had sent in PA form before that and I would check to see what the problem is and let her know.

## 2011-05-08 ENCOUNTER — Telehealth: Payer: Self-pay

## 2011-05-08 NOTE — Telephone Encounter (Signed)
Pt states she spoke to [place] yesterday about sending a prior auth form to insurance co. States Pamala Hurry told her it was sent. States she was told it was accepted and when she went to fill her rx today, they wouldn't do it stating they didn't have a prior auth.  Please call pt  bf

## 2011-05-10 NOTE — Telephone Encounter (Signed)
Pt states she received her form. Everything is taken care of.

## 2011-05-15 ENCOUNTER — Encounter: Payer: Self-pay | Admitting: Emergency Medicine

## 2011-05-15 ENCOUNTER — Ambulatory Visit (INDEPENDENT_AMBULATORY_CARE_PROVIDER_SITE_OTHER): Payer: Medicare Other | Admitting: Emergency Medicine

## 2011-05-15 VITALS — BP 102/62 | HR 75 | Temp 98.0°F | Resp 16 | Ht 58.5 in | Wt 86.2 lb

## 2011-05-15 DIAGNOSIS — E538 Deficiency of other specified B group vitamins: Secondary | ICD-10-CM

## 2011-05-15 DIAGNOSIS — D649 Anemia, unspecified: Secondary | ICD-10-CM

## 2011-05-15 DIAGNOSIS — L299 Pruritus, unspecified: Secondary | ICD-10-CM

## 2011-05-15 MED ORDER — CYANOCOBALAMIN 1000 MCG/ML IJ SOLN
1000.0000 ug | Freq: Once | INTRAMUSCULAR | Status: AC
  Administered 2011-05-15: 1000 ug via INTRAMUSCULAR

## 2011-05-15 MED ORDER — HYDROXYZINE HCL 25 MG PO TABS: 25.0000 mg | ORAL_TABLET | Freq: Three times a day (TID) | ORAL | Status: DC | PRN

## 2011-05-15 NOTE — Progress Notes (Signed)
  Subjective:    Patient ID: Taylor Roth, female    DOB: Mar 04, 1928, 78 y.o.   MRN: [id number]  HPI patient enters to get her B12 shot. She has not followed up with the dermatologist however the biopsy done of her scalp was benign. The area has  cleared up. she is complaining of some rash on her left arm and complaining of sores on the inside of her mouth and in her nose. She has no cough or cold symptoms.    Review of Systems mainly as related to the present complaints     Objective:   Physical Exam weight is up from her last visit. She had a weight recorded of 86 which is up approximately 4 pounds from previous her chest is clear the abdomen is soft there's some minimal tenderness right upper ab        Assessment & Plan:  Patient stable on current medications. We'll go ahead and refill her Atarax. Go ahead and give B12 1 cc IM.

## 2011-05-16 ENCOUNTER — Telehealth: Payer: Self-pay

## 2011-05-16 NOTE — Telephone Encounter (Signed)
Spoke with Dr. Everlene Farrier, he states that patient had no sign of a cold or sinus infection yesterday.  She is suffering from allergies and just needs to stay indoors and continue using her allergy meds (Allegra).  Patient notified this and is not happy that we cannot rx abx.  She states she will just call elsewhere and try to get someone to rx her something.  MD notified.

## 2011-05-16 NOTE — Telephone Encounter (Signed)
PT STATES SHE HAD SEEN DR DAUB YESTERDAY AND HAD TOLD HIM ABOUT HER SINUSES. HE DIDN'T GIVE HER ANYTHING FOR IT AND SHE HAVE AN APPT TOMORROW AT [facility] BUT ISN'T ABLE TO GO UNTIL SHE CAN GET AN ANTIBIOTIC CALLED IN. PLEASE CALL PT AT [phone]   [address]

## 2011-05-26 ENCOUNTER — Telehealth: Payer: Self-pay

## 2011-05-26 DIAGNOSIS — R05 Cough: Secondary | ICD-10-CM

## 2011-05-26 NOTE — Telephone Encounter (Signed)
Patient is out of her medication and needs refills on avelox and benzonatate 100mg  capsules. Called her pharmacy and states they sent over requests through surescripts yesterday and this morning. Please call patient back at [phone].

## 2011-05-26 NOTE — Telephone Encounter (Signed)
Advised pt to RTC. She states that she cannot.  She says she is coughing and has chest congestion.

## 2011-05-27 NOTE — Telephone Encounter (Signed)
Please call patient we cannot refill her Avelox but if she wants we can refill her Tessalon Perles.. Otherwise she is to return to clinic

## 2011-05-28 NOTE — Telephone Encounter (Signed)
Spoke to patient for 15 minutes about previous message. At end of conversation, patient still wanted benzonatate capsules refilled. Patient saw eye doctor today.Marland Kitchen

## 2011-05-29 MED ORDER — BENZONATATE 100 MG PO CAPS: 100.0000 mg | ORAL_CAPSULE | Freq: Three times a day (TID) | ORAL | Status: AC | PRN

## 2011-05-29 NOTE — Telephone Encounter (Signed)
Done and sent in.  Taylor Roth

## 2011-06-07 ENCOUNTER — Other Ambulatory Visit: Payer: Self-pay | Admitting: Physician Assistant

## 2011-06-20 ENCOUNTER — Other Ambulatory Visit: Payer: Self-pay | Admitting: Physician Assistant

## 2011-06-21 DIAGNOSIS — H31019 Macula scars of posterior pole (postinflammatory) (post-traumatic), unspecified eye: Secondary | ICD-10-CM | POA: Insufficient documentation

## 2011-06-21 DIAGNOSIS — H353190 Nonexudative age-related macular degeneration, unspecified eye, stage unspecified: Secondary | ICD-10-CM | POA: Insufficient documentation

## 2011-06-21 DIAGNOSIS — Z961 Presence of intraocular lens: Secondary | ICD-10-CM | POA: Insufficient documentation

## 2011-06-26 ENCOUNTER — Other Ambulatory Visit: Payer: Self-pay | Admitting: *Deleted

## 2011-06-26 ENCOUNTER — Encounter: Payer: Self-pay | Admitting: Emergency Medicine

## 2011-06-26 ENCOUNTER — Other Ambulatory Visit: Payer: Self-pay | Admitting: Emergency Medicine

## 2011-06-26 ENCOUNTER — Ambulatory Visit (INDEPENDENT_AMBULATORY_CARE_PROVIDER_SITE_OTHER): Payer: Medicare Other | Admitting: Emergency Medicine

## 2011-06-26 ENCOUNTER — Other Ambulatory Visit: Payer: Self-pay | Admitting: Family Medicine

## 2011-06-26 VITALS — BP 112/68 | HR 81 | Temp 98.8°F | Resp 16 | Ht 59.0 in | Wt 81.2 lb

## 2011-06-26 DIAGNOSIS — H409 Unspecified glaucoma: Secondary | ICD-10-CM | POA: Insufficient documentation

## 2011-06-26 DIAGNOSIS — M549 Dorsalgia, unspecified: Secondary | ICD-10-CM

## 2011-06-26 DIAGNOSIS — H3530 Unspecified macular degeneration: Secondary | ICD-10-CM

## 2011-06-26 DIAGNOSIS — H539 Unspecified visual disturbance: Secondary | ICD-10-CM | POA: Insufficient documentation

## 2011-06-26 DIAGNOSIS — R634 Abnormal weight loss: Secondary | ICD-10-CM

## 2011-06-26 DIAGNOSIS — E538 Deficiency of other specified B group vitamins: Secondary | ICD-10-CM

## 2011-06-26 DIAGNOSIS — G7109 Other specified muscular dystrophies: Secondary | ICD-10-CM

## 2011-06-26 DIAGNOSIS — D649 Anemia, unspecified: Secondary | ICD-10-CM

## 2011-06-26 DIAGNOSIS — H3589 Other specified retinal disorders: Secondary | ICD-10-CM | POA: Insufficient documentation

## 2011-06-26 DIAGNOSIS — R51 Headache: Secondary | ICD-10-CM

## 2011-06-26 HISTORY — DX: Unspecified macular degeneration: H35.30

## 2011-06-26 HISTORY — DX: Unspecified glaucoma: H40.9

## 2011-06-26 LAB — CBC WITH DIFFERENTIAL/PLATELET
Basophils Relative: 1 % (ref 0–1)
Eosinophils Absolute: 0.2 10*3/uL (ref 0.0–0.7)
Lymphs Abs: 1.6 10*3/uL (ref 0.7–4.0)
MCHC: 32.7 g/dL (ref 30.0–36.0)
Monocytes Relative: 11 % (ref 3–12)
Neutro Abs: 2.9 10*3/uL (ref 1.7–7.7)
Neutrophils Relative %: 55 % (ref 43–77)
Platelets: 184 10*3/uL (ref 150–400)
WBC: 5.3 10*3/uL (ref 4.0–10.5)

## 2011-06-26 LAB — COMPREHENSIVE METABOLIC PANEL
ALT: 15 U/L (ref 0–35)
AST: 22 U/L (ref 0–37)
CO2: 28 mEq/L (ref 19–32)
Creat: 0.75 mg/dL (ref 0.50–1.10)
Sodium: 143 mEq/L (ref 135–145)
Total Bilirubin: 0.3 mg/dL (ref 0.3–1.2)
Total Protein: 5.6 g/dL — ABNORMAL LOW (ref 6.0–8.3)

## 2011-06-26 LAB — POCT SEDIMENTATION RATE: POCT SED RATE: 9 mm/hr (ref 0–22)

## 2011-06-26 LAB — TSH: TSH: 0.509 u[IU]/mL (ref 0.350–4.500)

## 2011-06-26 MED ORDER — CYANOCOBALAMIN 1000 MCG/ML IJ SOLN
1000.0000 ug | Freq: Once | INTRAMUSCULAR | Status: AC
  Administered 2011-06-26: 1000 ug via INTRAMUSCULAR

## 2011-06-26 MED ORDER — CYANOCOBALAMIN 1000 MCG/ML IJ SOLN
1000.0000 ug | INTRAMUSCULAR | Status: DC
  Administered 2011-08-21 – 2011-09-20 (×2): 1000 ug via INTRAMUSCULAR

## 2011-06-26 NOTE — Progress Notes (Signed)
  Subjective:    Patient ID: Taylor Roth, female    DOB: May 09, 1928, 78 y.o.   MRN: [id number]  HPI    Review of Systems     Objective:   Physical Exam        Assessment & Plan:

## 2011-06-26 NOTE — Progress Notes (Signed)
  Subjective:    Patient ID: Taylor Roth, female    DOB: December 03, 1928, 78 y.o.   MRN: [id number]  HPI    Review of Systems     Objective:   Physical Exam        Assessment & Plan:

## 2011-06-26 NOTE — Progress Notes (Signed)
  Subjective:    Patient ID: Taylor Roth, female    DOB: Nov 27, 1928, 78 y.o.   MRN: [id number]  HPI    Review of Systems     Objective:   Physical Exam        Assessment & Plan:  I called back to talk to talk to Taylor Roth and her daughter. Apparently she has been very depressed recently. The daughter is concerned that she may do something to herself or take an overdose or trying to herself. She states she can get very angry and belligerent in time and difficult to handle. She states she has refused to come live with her. I advised her to get her medications put out until containers and tell her that she needed to lay out her medications because she usually is unable to do that. I agree with her she needs 24 7 care and she should continue to try and do that for her. I told her she chould call next Tuesday when Taylor Roth was not there and I be happy to talk further to her about Taylor Roth.

## 2011-06-26 NOTE — Progress Notes (Signed)
Addended by: Arlyss Queen A on: 06/26/2011 05:29 PM   Modules accepted: Orders

## 2011-06-26 NOTE — Progress Notes (Signed)
  Subjective:    Patient ID: Taylor Roth, female    DOB: 1928-04-14, 78 y.o.   MRN: [id number]  HPI    Review of Systems     Objective:   Physical Exam        Assessment & Plan:

## 2011-06-26 NOTE — Progress Notes (Signed)
  Subjective:    Patient ID: Taylor Roth, female    DOB: [birth year], 78 y.o.   MRN: [id number]  HPI patient enters for followup. The last time she presented here she was ill advised due to 102. Apparently when she got there she was not seen. She states she she was treated the rudely at 102 and went to another facility to be checked. She was seen at that time at the urgent care center [facility]. In the interim she has been seen by her eye doctor. 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 This is secondary to macular degeneration and glaucoma. She continues to live at home. She does not wear her a ADT bracelet. She refuses to use her walker.    Review of Systems patient is a long-standing patient of Dr. Minna Merritts and is on hydrocodone 4 times a day. She also has been to see Dr. Huey Bienenstock and received a prescription for Fiorinal     Objective:   Physical Exam physical exam reveals a debilitated elderly y lady in no acute distress. Her neck is supple. Her chest is clear to auscultation and percussion. Cardiac exam is unremarkable. She has lost weight since her last visit. She has lost 5 pounds since her visit 6 weeks ago.       Assessment & Plan:  Patient still very upset about her last encounter here. She presented here and wanted to be worked in. She was advised to go to 102 evaluation at that facility. She states that he can't staff treated her poorly when she arrived there and  she left. Since that time she has spoken with Dr. Huey Bienenstock and received a prescription for Fiorinal #3. I plan her sheets and she receives Fiorinal #3 from Dr. she also receives Vicoprofen 200/7.5 #120 every 4 months from Dr. Alfonso Ramus. I told the Ms. Gaylean I could not write her any pain medications from office. I advised the daughter that if she had issues with a front staff at the other facility she should take down the names and send her experience to our Engineer, building services. I told Ms. Gaylean she would have to  wear her monitor. I told her she should use a walker at home. I told her she should let go in with her daughter who is agreeable to have her live there. She is becoming blind in both eyes secondary to glaucoma and macular degeneration and will need 24 7 assistance. I refused to write for pain medications this visit.  her B12 was administered. She will followup in 3 months. Advised her to continue with Ensure Plus or muscle Beltran put on some weight. I did go ahead and refer her to pain management.

## 2011-06-28 ENCOUNTER — Telehealth: Payer: Self-pay

## 2011-06-28 NOTE — Telephone Encounter (Signed)
Spoke with daughter 06/27/11 for Dr Everlene Farrier. Requested that she come in and talk with Dr Everlene Farrier either 5/16 or 5/17. Daughter had requested a verbal consult at last appointment on Monday 06/25/2011. She will come in Thursday 06/28/2011 to [facility] walk-in center. Daughter had registered a complaint with Dr Everlene Farrier concerning a visit at the 102 front desk. She stated that two front desk staff had held a chart in front of their face and made comments about her mother and then laughed. She left with her mother that day and did not let her be seen in the office. She did tell the staff members what she thought before she left. On the day in question one of the staff members came to me and explained what had happened. The staff member stated that she and the other employee did not make any comments or laugh. To the patients daughter I apologized if there was a misunderstanding or in anyway that her mother was felt laughed at or degraded.

## 2011-06-29 ENCOUNTER — Telehealth: Payer: Self-pay

## 2011-06-29 NOTE — Telephone Encounter (Signed)
Pt is having an reaction to some eye drops, [name] her daughter is asking for pt that Dr call in a rx of pretnizone she is having a burning sensation in her legs.

## 2011-06-30 NOTE — Telephone Encounter (Signed)
LMOM to CB.  Will likely need OV for eye drop reaction, and eval before Prednisone can be rx'd.

## 2011-06-30 NOTE — Telephone Encounter (Signed)
Spoke with patients daughter [name], who states that patient was having a reaction to her eye drops--burning and tearing up.  Pharmacist told them that her beta blocker could be intensifying pain...  Was advised by her Eye MD Dr. Anastasia Pall to d/c these for now.  Patient is also having burning sensation in her legs-- is taking her "little white pill"--?Hydroxyzine?  And is doing better.  Advised that if symptoms worsen, to RTC for eval.  Daughter understood.  Chart to MD--FYI.

## 2011-07-23 ENCOUNTER — Other Ambulatory Visit: Payer: Self-pay | Admitting: Physician Assistant

## 2011-07-28 ENCOUNTER — Other Ambulatory Visit: Payer: Self-pay | Admitting: Physician Assistant

## 2011-07-31 ENCOUNTER — Ambulatory Visit: Payer: Medicare Other | Admitting: Emergency Medicine

## 2011-08-14 ENCOUNTER — Ambulatory Visit: Payer: Medicare Other | Admitting: Emergency Medicine

## 2011-08-21 ENCOUNTER — Ambulatory Visit (INDEPENDENT_AMBULATORY_CARE_PROVIDER_SITE_OTHER): Payer: Medicare Other | Admitting: Emergency Medicine

## 2011-08-21 ENCOUNTER — Encounter: Payer: Self-pay | Admitting: Emergency Medicine

## 2011-08-21 VITALS — BP 133/74 | HR 69 | Temp 98.1°F | Resp 16 | Ht <= 58 in | Wt 88.0 lb

## 2011-08-21 DIAGNOSIS — D518 Other vitamin B12 deficiency anemias: Secondary | ICD-10-CM

## 2011-08-21 DIAGNOSIS — F411 Generalized anxiety disorder: Secondary | ICD-10-CM

## 2011-08-21 DIAGNOSIS — D649 Anemia, unspecified: Secondary | ICD-10-CM

## 2011-08-21 DIAGNOSIS — H5710 Ocular pain, unspecified eye: Secondary | ICD-10-CM

## 2011-08-21 DIAGNOSIS — H53139 Sudden visual loss, unspecified eye: Secondary | ICD-10-CM

## 2011-08-21 DIAGNOSIS — R51 Headache: Secondary | ICD-10-CM

## 2011-08-21 DIAGNOSIS — R634 Abnormal weight loss: Secondary | ICD-10-CM

## 2011-08-21 MED ORDER — CYANOCOBALAMIN 1000 MCG/ML IJ SOLN: 1000.0000 ug | INTRAMUSCULAR | Status: DC

## 2011-08-21 MED ORDER — KETOROLAC TROMETHAMINE 0.5 % OP SOLN: OPHTHALMIC | Status: DC

## 2011-08-21 NOTE — Addendum Note (Signed)
Addended by: Tommas Olp B on: 08/21/2011 12:33 PM   Modules accepted: Orders

## 2011-08-21 NOTE — Progress Notes (Signed)
  Subjective:    Patient ID: Taylor Roth, female    DOB: 1928/06/17, 78 y.o.   MRN: [id number]  HPI patient enters for followup of her B12 deficiency she's been trying to not put on weight because she says her back hurts more when she gets heavier. She is still seen the eye doctors from [facility] a regular basis because of her vision loss. They have recommended injections of the left eye however she has declined to have that done. She is requesting a prescription for Accular drops which she uses once a day in her eyes.    Review of Systems     Objective:   Physical Exam  Constitutional: She is oriented to person, place, and time.  HENT:       She has very limited vision in both  Neck: No thyromegaly present.  Cardiovascular: Normal rate and regular rhythm.   Pulmonary/Chest: Effort normal and breath sounds normal.  Abdominal: Soft. There is no tenderness.  Neurological: She is alert and oriented to person, place, and time. No cranial nerve deficit. Coordination normal.          Assessment & Plan:  I did give her prescription for Acular drops to use until she can get in to see the ophthalmologist from [facility]. I made a referral to the headache Center to help with her headaches. I refuse to write for the Fiorinal which she has taken before because there is some concern over other people having access to where she keeps her medication. She was given a shot of B12 today

## 2011-08-31 ENCOUNTER — Telehealth: Payer: Self-pay

## 2011-08-31 NOTE — Telephone Encounter (Signed)
The patient is requesting return call from Dr. Everlene Farrier or his nurse Jacquenette Shone.  Please call the patient at [phone].

## 2011-09-01 NOTE — Telephone Encounter (Signed)
Spoke with patient-- she wants MD to know that she is not going to [place] for appt.  She does not believe it is necessary, and feels that you are punishing her because of her daughter.  I let her know that she would require referral for this, since we will not rx med, but patient did not want to go to that facility because they said she would have to wait 3 hours and could not tell her over the phone what pain meds they could give her.  To MD-FYI.

## 2011-09-02 ENCOUNTER — Telehealth: Payer: Self-pay | Admitting: Family Medicine

## 2011-09-02 NOTE — Telephone Encounter (Signed)
Called patient, no answer 

## 2011-09-02 NOTE — Telephone Encounter (Signed)
Patient notified message, and recommended she contact [facility] to reschedule appt.  Patient refused-- stated that MD does not trust her with 30 pills of her medicine and that she has been treated badly here.  Then patient hung up.

## 2011-09-02 NOTE — Telephone Encounter (Signed)
Please call patient. I do not want to prescribe more medicine because of all the meds she is already on.

## 2011-09-02 NOTE — Telephone Encounter (Signed)
If she feels we do not treat her appropriately then she needs to make an appt at 104 to decide if she wants urgent care to continue to be her provider

## 2011-09-02 NOTE — Telephone Encounter (Signed)
Patient states that Taylor Roth told her to return to 104 for her B12 shot and that she will talk to someone then patient also states that she didn't hang up on Taylor Roth that shes 78 yrs old and dont have the time to keep running around to different places i advised her to make an aptt to discuss this matter of a primary doctor

## 2011-09-03 ENCOUNTER — Other Ambulatory Visit: Payer: Self-pay | Admitting: Family Medicine

## 2011-09-10 ENCOUNTER — Telehealth: Payer: Self-pay

## 2011-09-10 NOTE — Telephone Encounter (Signed)
Pt states Taylor Roth had called and left a message for her to come in on Friday 09/21/11 for an injection. Pt states cant come in on that day and wants to know another date and time. Please call pt to advise

## 2011-09-10 NOTE — Telephone Encounter (Signed)
Forwarded to scheduling pool in error, not an appointment

## 2011-09-11 ENCOUNTER — Other Ambulatory Visit: Payer: Self-pay | Admitting: Emergency Medicine

## 2011-09-11 ENCOUNTER — Telehealth: Payer: Self-pay

## 2011-09-11 DIAGNOSIS — D649 Anemia, unspecified: Secondary | ICD-10-CM

## 2011-09-11 MED ORDER — CYANOCOBALAMIN 1000 MCG/ML IJ SOLN
1000.0000 ug | INTRAMUSCULAR | Status: DC
  Administered 2011-10-16: 1000 ug via INTRAMUSCULAR

## 2011-09-11 NOTE — Telephone Encounter (Signed)
Taylor Roth will call patient to make appt for injection that suits her better.

## 2011-09-11 NOTE — Telephone Encounter (Signed)
Spoke with Jacquenette Shone, she will call pt back to reschedule injection

## 2011-09-11 NOTE — Telephone Encounter (Signed)
Pt states she had a message from [place] regarding coming in on 09/21/11 for an injection. Pt cant come in at that time and wants to know another time

## 2011-09-12 ENCOUNTER — Telehealth: Payer: Self-pay | Admitting: Family Medicine

## 2011-09-12 NOTE — Telephone Encounter (Signed)
Spoke with Ms. Broomell about her scheduled B12 injection. Advised her that she could come in around the scheduled date (09/21/11). She was confused and thought she HAD to be here on the exact date.  Pt. informed me that she was in pain from a fall this morning. I advised her to come in and be seen to make sure there were no broken bones. She understood.

## 2011-09-12 NOTE — Telephone Encounter (Signed)
If she fell today and she was injured she needs to return to clinic for evaluation at [facility] or at the hospital.

## 2011-09-20 ENCOUNTER — Ambulatory Visit (INDEPENDENT_AMBULATORY_CARE_PROVIDER_SITE_OTHER): Payer: Medicare Other | Admitting: Family Medicine

## 2011-09-20 ENCOUNTER — Other Ambulatory Visit: Payer: Self-pay | Admitting: Physician Assistant

## 2011-09-20 DIAGNOSIS — D649 Anemia, unspecified: Secondary | ICD-10-CM

## 2011-09-20 NOTE — Telephone Encounter (Signed)
Ok x 2  

## 2011-09-23 ENCOUNTER — Encounter: Payer: Self-pay | Admitting: Emergency Medicine

## 2011-09-23 ENCOUNTER — Ambulatory Visit (INDEPENDENT_AMBULATORY_CARE_PROVIDER_SITE_OTHER): Payer: Medicare Other | Admitting: Emergency Medicine

## 2011-09-23 ENCOUNTER — Ambulatory Visit: Payer: Medicare Other

## 2011-09-23 VITALS — BP 130/82 | HR 84 | Temp 98.2°F | Resp 16 | Ht <= 58 in | Wt 86.2 lb

## 2011-09-23 DIAGNOSIS — M545 Low back pain: Secondary | ICD-10-CM

## 2011-09-23 DIAGNOSIS — M549 Dorsalgia, unspecified: Secondary | ICD-10-CM

## 2011-09-23 DIAGNOSIS — R350 Frequency of micturition: Secondary | ICD-10-CM

## 2011-09-23 DIAGNOSIS — N390 Urinary tract infection, site not specified: Secondary | ICD-10-CM

## 2011-09-23 LAB — POCT URINALYSIS DIPSTICK
Bilirubin, UA: NEGATIVE
Glucose, UA: NEGATIVE
Ketones, UA: NEGATIVE
pH, UA: 6

## 2011-09-23 LAB — POCT UA - MICROSCOPIC ONLY
Casts, Ur, LPF, POC: NEGATIVE
Crystals, Ur, HPF, POC: NEGATIVE

## 2011-09-23 MED ORDER — NITROFURANTOIN MONOHYD MACRO 100 MG PO CAPS: 100.0000 mg | ORAL_CAPSULE | Freq: Two times a day (BID) | ORAL | Status: AC

## 2011-09-23 NOTE — Progress Notes (Signed)
  Subjective:    Patient ID: Taylor Roth, female    DOB: 04-02-1928, 78 y.o.   MRN: [id number]  HPI patient was done well until Wednesday night when she fell in her regimen fell back onto her bed. She is having discomfort in her mid back. She also has had some urinary symptoms with urgency she has a long history of back related problems and has a severe kyphoscoliosis.    Review of Systems patient has severe eye disease. She is under the care of Dr. Gershon Crane patient is nearly blind at the present time. Apparently there is no specific therapy they can do to help with her eye problem     Objective:   Physical Exam patient is alert and cooperative today. She's not in any distress. The neck is supple. There are multiple excoriated areas over the back.  [facility] reading (PRIMARY) by  Dr.Don Giarrusso L3 has a compression deformity but on comparison to old films it appears to be present on films from 2011 she suffers from a severe kyphoscoliosis involves the entire thoracic and lumbar spine  Results for orders placed in visit on 09/23/11  POCT URINALYSIS DIPSTICK      Component Value Range   Color, UA yellow     Clarity, UA cloudy     Glucose, UA neg     Bilirubin, UA neg     Ketones, UA neg     Spec Grav, UA 1.025     Blood, UA mod     pH, UA 6.0     Protein, UA 30     Urobilinogen, UA 0.2     Nitrite, UA neg     Leukocytes, UA large (3+)    POCT UA - MICROSCOPIC ONLY      Component Value Range   WBC, Ur, HPF, POC TNTC     RBC, urine, microscopic 5-8     Bacteria, U Microscopic 3+     Mucus, UA trace     Epithelial cells, urine per micros 2-3     Crystals, Ur, HPF, POC neg     Casts, Ur, LPF, POC neg     Yeast, UA neg         Assessment & Plan:  Patient does appear to have a urinary tract infection. She has multiple drug allergies so we'll use Macrobid 100 twice a day for 3 days. I did not change her pain medications I did not change her psych medicines. I told her I would be happy to  refer her to a second psychiatrist to give an opinion and they did change her medications. They're major issues at home regarding her daughter who has taken her medication in the past. There is another daughter come in today from [place] and I told her she needed to work with her daughter about finding a facility where she can have 36 7 care because of her developing blindness.

## 2011-09-23 NOTE — Patient Instructions (Addendum)

## 2011-09-25 ENCOUNTER — Other Ambulatory Visit: Payer: Self-pay | Admitting: Family Medicine

## 2011-09-25 ENCOUNTER — Other Ambulatory Visit: Payer: Self-pay | Admitting: Emergency Medicine

## 2011-09-25 ENCOUNTER — Telehealth: Payer: Self-pay | Admitting: Emergency Medicine

## 2011-09-25 NOTE — Telephone Encounter (Signed)
Has been taking taking prednisone Dr Alfonso Ramus gave it to her, she states she is not allergic to it, he gave it to her so she will not take so many pain pills. She needs Betamethazone cream and she also needs the Anusol. She said if she is not better with this by Thursday she is going to go see Dr Alfonso Ramus.

## 2011-09-25 NOTE — Telephone Encounter (Signed)
PT STATES THE NEW MEDICINE(SHE DID NOT KNOW NAME OF DRUG) IS NOT WORKING FOR HER,STATES SHE IS ALLERGIC TO IT?   BEST PHONE [phone]

## 2011-09-25 NOTE — Telephone Encounter (Signed)
I have spoken to Dr Everlene Farrier again about this, to advise of conversation with patient, she is not taking atarax, she is taking benadryl 50 mg, she is taking prednisone that was left over from Dr Alfonso Ramus. Dr Everlene Farrier does not feel more prednisone is appropriate for her, I have called her to let her know to stop prednisone and to take the Benadryl. She is instructed to come in if she gets worse.

## 2011-09-25 NOTE — Telephone Encounter (Signed)
Deny both, correct?  Shouldn't take benadryl daily.Marland KitchenMarland Kitchen

## 2011-09-25 NOTE — Telephone Encounter (Signed)
Please call reason let her know to stop her Macrobid since she feels like she is having a reaction. I will call her tomorrow when I get the sensitivities back on her urine culture and we can decide what medicine to use at that time. Ask her the specifics of what type of allergic reaction she feels she is having.

## 2011-09-25 NOTE — Telephone Encounter (Signed)
I have called her back she has taken 3 doses, after her 2nd dose yesterday she has been burning all over. She has only 1 day left on her prednisone 10mg  she wants this sent to [address].

## 2011-09-26 LAB — URINE CULTURE

## 2011-10-04 ENCOUNTER — Other Ambulatory Visit: Payer: Self-pay | Admitting: Family Medicine

## 2011-10-04 MED ORDER — CLORAZEPATE DIPOTASSIUM 7.5 MG PO TABS: 7.5000 mg | ORAL_TABLET | Freq: Two times a day (BID) | ORAL | Status: DC

## 2011-10-16 ENCOUNTER — Encounter: Payer: Self-pay | Admitting: Emergency Medicine

## 2011-10-16 ENCOUNTER — Ambulatory Visit: Payer: Medicare Other | Admitting: Emergency Medicine

## 2011-10-16 ENCOUNTER — Ambulatory Visit (INDEPENDENT_AMBULATORY_CARE_PROVIDER_SITE_OTHER): Payer: Medicaid Other | Admitting: Emergency Medicine

## 2011-10-16 VITALS — BP 106/58 | HR 75 | Temp 98.0°F | Resp 16 | Ht 58.5 in | Wt 84.8 lb

## 2011-10-16 DIAGNOSIS — F419 Anxiety disorder, unspecified: Secondary | ICD-10-CM

## 2011-10-16 DIAGNOSIS — F411 Generalized anxiety disorder: Secondary | ICD-10-CM

## 2011-10-16 MED ORDER — CLORAZEPATE DIPOTASSIUM 7.5 MG PO TABS: 7.5000 mg | ORAL_TABLET | Freq: Two times a day (BID) | ORAL | Status: DC

## 2011-10-16 MED ORDER — CLORAZEPATE DIPOTASSIUM 7.5 MG PO TABS: 7.5000 mg | ORAL_TABLET | Freq: Three times a day (TID) | ORAL | Status: DC

## 2011-10-16 NOTE — Progress Notes (Signed)
  Subjective:    Patient ID: Taylor Roth, female    DOB: May 27, 1928, 78 y.o.   MRN: [id number]  HPI patient enters for her B12 injection. She continues to be bothered with headaches and is requesting Fioricet. She states her Tranxene prescription was written for 60 instead of 90 pills because she normally takes that medication 3 times a day.    Review of Systems     Objective:   Physical Exam patient is alert and cooperative. Her neck is supple. Chest is clear. Cardiac exam is unremarkable. She has a significant kyphoscoliosis .        Assessment & Plan:  I did correct her prescription for Tranxene. I did not write her for any headache medications. I told her all pain medications would have to come through Dr. Lytle Michaels office.

## 2011-10-18 ENCOUNTER — Other Ambulatory Visit: Payer: Self-pay | Admitting: Family Medicine

## 2011-10-18 MED ORDER — ZANTAC 150 MG PO TABS: 150.0000 mg | ORAL_TABLET | Freq: Two times a day (BID) | ORAL | Status: DC

## 2011-10-30 ENCOUNTER — Ambulatory Visit: Payer: Medicare Other | Admitting: Emergency Medicine

## 2011-11-03 ENCOUNTER — Telehealth: Payer: Self-pay

## 2011-11-03 NOTE — Telephone Encounter (Signed)
Pt wanted Dr. Everlene Farrier to know that medicaid has increased her hours from 17 to 37 ( I don't know what this means but Lashyra says this is good news). Also, Medicaid has advised that they would cover her ensure + if we write a RX for it, that way she would not have to use her food stamps to pay for it. [phone]

## 2011-11-05 ENCOUNTER — Telehealth: Payer: Self-pay

## 2011-11-05 MED ORDER — ENSURE COMPLETE SHAKE PO LIQD: 1.0000 | Freq: Two times a day (BID) | ORAL | Status: DC

## 2011-11-05 NOTE — Telephone Encounter (Signed)
Please call Taylor Roth. Ask her how many ensure she needs per day and I will write a prescription.

## 2011-11-05 NOTE — Telephone Encounter (Signed)
I spoke to patient and she drinks 2/day I told her I will get her the Rx so she can get this. I printed the Rx and will have Dr Everlene Farrier sign it.

## 2011-11-05 NOTE — Telephone Encounter (Signed)
Printed Dr Everlene Farrier needs to sign

## 2011-11-05 NOTE — Telephone Encounter (Signed)
Patients agency at [place] called. phone [phone])  Fax [phone]). Needs Rx for Ensure 11 refills. Needs the prescription faxed to health department to [facility] Fax number is [phone]). Call [facility] for questions at [phone]). Thank you!

## 2011-11-05 NOTE — Telephone Encounter (Signed)
Dr Everlene Farrier to sign, then I can fax to # given

## 2011-11-06 ENCOUNTER — Telehealth: Payer: Self-pay

## 2011-11-06 NOTE — Telephone Encounter (Signed)
Pt called and asked [place] to rescind the [facility] yesterday.  Please rewrite the ensure script and fax to [phone]

## 2011-11-06 NOTE — Telephone Encounter (Addendum)
done

## 2011-11-06 NOTE — Telephone Encounter (Signed)
Please rescind the [facility] yesterday with 11 refills for ensures.  FAX to [id number]  Per Aurea Graff.

## 2011-11-06 NOTE — Telephone Encounter (Signed)
Ok done

## 2011-11-06 NOTE — Telephone Encounter (Signed)
done

## 2011-11-07 NOTE — Telephone Encounter (Signed)
See notes under other phone message

## 2011-11-09 ENCOUNTER — Telehealth: Payer: Self-pay | Admitting: Radiology

## 2011-11-09 DIAGNOSIS — H5710 Ocular pain, unspecified eye: Secondary | ICD-10-CM

## 2011-11-09 MED ORDER — KETOROLAC TROMETHAMINE 0.5 % OP SOLN: 1.0000 [drp] | Freq: Every day | OPHTHALMIC | Status: DC

## 2011-11-09 NOTE — Telephone Encounter (Signed)
Pharmacy called, Dr Everlene Farrier wrote the eye drop for one drop eyes, pharmacy needed specific instructions to put the drops in both Eyes, I have corrected this.

## 2011-11-13 ENCOUNTER — Ambulatory Visit (INDEPENDENT_AMBULATORY_CARE_PROVIDER_SITE_OTHER): Payer: Medicare Other | Admitting: Emergency Medicine

## 2011-11-13 DIAGNOSIS — Z23 Encounter for immunization: Secondary | ICD-10-CM

## 2011-11-13 DIAGNOSIS — D649 Anemia, unspecified: Secondary | ICD-10-CM

## 2011-11-13 DIAGNOSIS — D518 Other vitamin B12 deficiency anemias: Secondary | ICD-10-CM

## 2011-11-13 MED ORDER — CYANOCOBALAMIN 1000 MCG/ML IJ SOLN
1000.0000 ug | Freq: Once | INTRAMUSCULAR | Status: AC
  Administered 2011-11-13: 1000 ug via INTRAMUSCULAR

## 2011-11-13 NOTE — Progress Notes (Signed)
  Subjective:    Patient ID: Taylor Roth, female    DOB: [birth year], 78 y.o.   MRN: [id number]  HPI patient here for initiation of B12 and flu shot only    Review of Systems     Objective:   Physical Exam not examined.        Assessment & Plan:  B12 and flu shot given.

## 2011-12-14 ENCOUNTER — Other Ambulatory Visit: Payer: Self-pay | Admitting: Physician Assistant

## 2011-12-16 ENCOUNTER — Other Ambulatory Visit: Payer: Self-pay | Admitting: Physician Assistant

## 2011-12-17 ENCOUNTER — Telehealth: Payer: Self-pay

## 2011-12-17 NOTE — Telephone Encounter (Addendum)
PT WANT [place] TO [facility] [address] AND GET HER REMERON SOLTABS REFILLED FOR HER. STATES IT ISN'T TIME, BUT THEY HAVE TO ORDER PLEASE CALL [phone]   [facility] [address]

## 2011-12-17 NOTE — Telephone Encounter (Signed)
This was done yesterday, called patient to advise.

## 2011-12-25 ENCOUNTER — Encounter: Payer: Self-pay | Admitting: Emergency Medicine

## 2011-12-25 ENCOUNTER — Ambulatory Visit (INDEPENDENT_AMBULATORY_CARE_PROVIDER_SITE_OTHER): Payer: Medicare Other | Admitting: Emergency Medicine

## 2011-12-25 ENCOUNTER — Telehealth: Payer: Self-pay | Admitting: Emergency Medicine

## 2011-12-25 VITALS — BP 116/69 | HR 79 | Temp 97.7°F | Resp 16 | Ht 59.0 in | Wt 86.0 lb

## 2011-12-25 DIAGNOSIS — R21 Rash and other nonspecific skin eruption: Secondary | ICD-10-CM

## 2011-12-25 DIAGNOSIS — R634 Abnormal weight loss: Secondary | ICD-10-CM

## 2011-12-25 DIAGNOSIS — R51 Headache: Secondary | ICD-10-CM

## 2011-12-25 DIAGNOSIS — N390 Urinary tract infection, site not specified: Secondary | ICD-10-CM

## 2011-12-25 DIAGNOSIS — D518 Other vitamin B12 deficiency anemias: Secondary | ICD-10-CM

## 2011-12-25 DIAGNOSIS — E538 Deficiency of other specified B group vitamins: Secondary | ICD-10-CM

## 2011-12-25 LAB — CBC WITH DIFFERENTIAL/PLATELET
Basophils Absolute: 0 K/uL (ref 0.0–0.1)
Basophils Relative: 0 % (ref 0–1)
Eosinophils Absolute: 0.2 K/uL (ref 0.0–0.7)
Eosinophils Relative: 3 % (ref 0–5)
HCT: 39.2 % (ref 36.0–46.0)
Hemoglobin: 12.9 g/dL (ref 12.0–15.0)
Lymphocytes Relative: 33 % (ref 12–46)
Lymphs Abs: 2.6 K/uL (ref 0.7–4.0)
MCH: 29.3 pg (ref 26.0–34.0)
MCHC: 32.9 g/dL (ref 30.0–36.0)
MCV: 88.9 fL (ref 78.0–100.0)
Monocytes Absolute: 0.6 K/uL (ref 0.1–1.0)
Monocytes Relative: 8 % (ref 3–12)
Neutro Abs: 4.3 K/uL (ref 1.7–7.7)
Neutrophils Relative %: 56 % (ref 43–77)
Platelets: 234 K/uL (ref 150–400)
RBC: 4.41 MIL/uL (ref 3.87–5.11)
RDW: 13.3 % (ref 11.5–15.5)
WBC: 7.7 K/uL (ref 4.0–10.5)

## 2011-12-25 LAB — POCT UA - MICROSCOPIC ONLY: Casts, Ur, LPF, POC: NEGATIVE

## 2011-12-25 LAB — POCT URINALYSIS DIPSTICK
Bilirubin, UA: NEGATIVE
Glucose, UA: NEGATIVE
Ketones, UA: NEGATIVE
Nitrite, UA: POSITIVE
pH, UA: 5.5

## 2011-12-25 MED ORDER — BETAMETHASONE DIPROPIONATE AUG 0.05 % EX CREA: TOPICAL_CREAM | Freq: Two times a day (BID) | CUTANEOUS | Status: DC

## 2011-12-25 MED ORDER — CYANOCOBALAMIN 1000 MCG/ML IJ SOLN
1000.0000 ug | Freq: Once | INTRAMUSCULAR | Status: AC
  Administered 2011-12-25: 1000 ug via INTRAMUSCULAR

## 2011-12-25 NOTE — Progress Notes (Signed)
  Subjective:    Patient ID: Taylor Roth, female    DOB: July 19, 1928, 78 y.o.   MRN: [id number]  HPI patient here for her B12 injection. She's also requesting medications for her headaches which she has chronically. She takes Vicoprofen for chronic back pain. She recently had an injection in her left shoulder now has a rash in the left upper shoulder area. She is known to have a urinary tract infection which is sensitive to all antibiotics however she is allergic to all common oral antibiotics.    Review of Systems     Objective:   Physical Exam patient is alert and cooperative in no distress. Her neck is supple. Her chest is clear. She has a significant kyphoscoliosis. She has very limited vision.       Assessment & Plan:  Urine culture was repeated today she was referred to urologist to help with her urinary tract infection since she is not able to take any oral antibiotics. I declined to refill her Fiorinal which she has taken in the past for headaches. She was given a B12 shot

## 2011-12-25 NOTE — Telephone Encounter (Signed)
Pharmacy called and the patient did have her prescription filled this month she will continue to get her prescriptions filled on a monthly basis as previously written.

## 2011-12-26 LAB — COMPREHENSIVE METABOLIC PANEL
ALT: 17 U/L (ref 0–35)
AST: 24 U/L (ref 0–37)
Albumin: 4.5 g/dL (ref 3.5–5.2)
Alkaline Phosphatase: 94 U/L (ref 39–117)
Calcium: 9.4 mg/dL (ref 8.4–10.5)
Chloride: 104 mEq/L (ref 96–112)
Creat: 0.76 mg/dL (ref 0.50–1.10)
Potassium: 4.3 mEq/L (ref 3.5–5.3)

## 2011-12-27 ENCOUNTER — Other Ambulatory Visit: Payer: Self-pay | Admitting: *Deleted

## 2011-12-27 ENCOUNTER — Telehealth: Payer: Self-pay | Admitting: *Deleted

## 2011-12-27 ENCOUNTER — Encounter: Payer: Self-pay | Admitting: *Deleted

## 2011-12-27 MED ORDER — IPRATROPIUM-ALBUTEROL 20-100 MCG/ACT IN AERS: 1.0000 | INHALATION_SPRAY | Freq: Three times a day (TID) | RESPIRATORY_TRACT | Status: DC

## 2011-12-27 MED ORDER — LEVALBUTEROL TARTRATE 45 MCG/ACT IN AERO: INHALATION_SPRAY | RESPIRATORY_TRACT | Status: DC

## 2011-12-27 MED ORDER — FLUTICASONE PROPIONATE HFA 110 MCG/ACT IN AERO: 1.0000 | INHALATION_SPRAY | Freq: Two times a day (BID) | RESPIRATORY_TRACT | Status: DC

## 2011-12-27 NOTE — Telephone Encounter (Signed)
Spoke with Dr. Everlene Farrier and he states that pt will have to get generic since the Brand name Tranxene is hard to get.  She got 50 on 10/19 at [facility]. Advised her we will fill her inhalers but will check with Dr. Everlene Farrier on Tranxene. I tried calling her back to advise her that Dr. Everlene Farrier would like for her to try the generic brand since its available. Please try calling again later  Notes Recorded by Darlyne Russian, MD on 12/26/2011 at 2:45 PM [facility] [address]. She can have refills on her nerve medications as long as she is getting them on schedule as they have been prescribed previously. She apparently took one of the prescriptions to another pharmacy but it was not filled there. She is on Tranxene and that can be refilled as long as it is the correct time. She can aso have 5 refills on her inhalers. Notes Recorded by Ginger Freida Busman, CMA on 12/26/2011 at 10:54 AM Patient states she needs her 3 inhalers refilled and her "nerve pills" Refilled. [facility]

## 2011-12-28 NOTE — Telephone Encounter (Signed)
Left message for her, to call back about this.

## 2011-12-31 MED ORDER — CLORAZEPATE DIPOTASSIUM 7.5 MG PO TABS: 7.5000 mg | ORAL_TABLET | Freq: Three times a day (TID) | ORAL | Status: DC

## 2011-12-31 NOTE — Telephone Encounter (Signed)
Spoke w/pt and she stated she is willing to try the generic Tranxene since she has been unable to get the brand name. [facility] Rx [facility] 3 RFs to cover her through date Dr Everlene Farrier had originally written for her Tranxene.

## 2012-01-15 ENCOUNTER — Telehealth: Payer: Self-pay

## 2012-01-15 NOTE — Telephone Encounter (Signed)
Please call the urology office and see if we can get her worked in today or tomorrow. Be sure the urologist has a copy of her urine culture and urine for when he sees her. Also ask Luis E. she is having any symptoms of fever or chills or vomiting.

## 2012-01-15 NOTE — Telephone Encounter (Signed)
Dr. Everlene Farrier let me know what you would like for me to do regarding this.
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
## 2012-01-15 NOTE — Telephone Encounter (Signed)
Patient would like Dr. Everlene Farrier to call her back regarding a possible stay in the hospital. I'm not sure I understood her correctly, but I think she wants to go to the hospital to take care of a bladder infection? She has an appointment with a urologist on the 10th but she says she thinks the hospital would take care of it before then. I urged her to wait until she saw the urologist or she could come here but she would like Dr. Everlene Farrier to call her back.  Best [phone]

## 2012-01-17 ENCOUNTER — Telehealth: Payer: Self-pay

## 2012-01-17 NOTE — Telephone Encounter (Signed)
Katie from [facility] called and reported that she was out to see pt today w/pt's CAPS worker and stated that pt is totally unable to handle managing her Rxs as is. She stated that pt was dropping them and can't see well. The CAPS worker said that the only way pt can retain her services is if she starts to get her medications delivered to her in bubble packs. Joellen Jersey stated that [facility] will package them this way. I called [facility] to ask how the bubble packs can be organized, and was told that they can package them by day, or by how many times per day she takes her medication. [facility] can also call pt's current pharmacy and transfer [place]. Katie frm [facility] wanted to check w/Dr Everlene Farrier as to how he thinks the Rxs need to be organized. Does anything differently need to be done for the pain meds that are taken prn?

## 2012-01-18 ENCOUNTER — Other Ambulatory Visit: Payer: Self-pay | Admitting: Physician Assistant

## 2012-01-18 NOTE — Telephone Encounter (Signed)
Pt called this morning and said she was worried about getting her Rxs in bubble packs because her mother had done that in the past and she had trouble getting her medications on time. I D/W her that [facility] delivers and that I will speak to the pharmacist when I call him back and tell him that she wants to make sure she gets her medication before she runs out. Pt stated that she guesses that she will have to try getting her medications in bubble packs if her CAPS will be stopped if she does not, and I advised her that I will call [facility] and have him call [facility] [place] and [place] locations to coordinate transferring her medications.  I called and spoke w/pharmacist, Sam, at [facility] and he stated that he will call both [facility] locations and get the Rxs transferred and will CB if any Rxs need to be RFd. I explained that her pain meds are Rxd by Dr Alfonso Ramus and that some of her meds are prn. He stated that he normally leaves prn medications in bottles so that pt's can take them only when they need them. I provided pt's address, phone # and allergies we have in her chart.

## 2012-01-18 NOTE — Telephone Encounter (Signed)
Please call the nurse [facility] and let her know that Dr. Alfonso Ramus is in charge of all of her pain meds. I am not familiar with using bubble packs with medications but if the nurse feels that would be safer forLouise I am in favor of that. It sounds as though the family needs to be making arrangements for patient to go into a care facility which she can have her medications administered appropriately and on schedule. The administration times and her medications is incredibly complicated and any help we can get through the pharmacy will be greatly appreciated

## 2012-01-21 ENCOUNTER — Telehealth: Payer: Self-pay

## 2012-01-21 NOTE — Telephone Encounter (Signed)
PT WOULD LIKE A CALL BACK REGARDING HER MEDICATIONS THAT SHE REQUESTED EARLIER TODAY, INFORMED PT THAT LINE WAS BUSY WHEN SHE WAS CALLED TODAY. BEST# [phone]

## 2012-01-21 NOTE — Telephone Encounter (Signed)
Line busy/ according to chart, she is to get her meds from Dr Alfonso Ramus.

## 2012-01-21 NOTE — Telephone Encounter (Signed)
PT STATES SHE ISN'T FEELING WELL, AND WE WERE SUPPOSE TO CALL HER IN SOME MEDICINE AND THEY DO NOT HAVE IT YET PLEASE CALL [phone]

## 2012-01-22 NOTE — Telephone Encounter (Signed)
I had received a VM from Katie to check status of getting bubble packs for pt. I called her back and D/W her what I had learned from Sam at [facility] and his plan to contact both [facility] locations and how he plans to package meds. Joellen Jersey stated that the CAP worker wants everything in bubble packs bc all of pt's meds had been spilled out of the bottles. Joellen Jersey stated she will call Sam to have him package prn meds in their separate bubble packs. Katie reported that pt has an appt w/her urologist today and also a mental health eval at [facility]. She stated she will keep [place] updated w/pt's status and CB if she needs anything else. Pt's daughter is here from out of stated and they are working on getting a POA set up.

## 2012-01-22 NOTE — Telephone Encounter (Signed)
Tried to call pt. No ans/no VM, recording said "system on enter access code".

## 2012-01-22 NOTE — Telephone Encounter (Signed)
Spoke w/pt and she stated that she did get the Halcion that we sent in on 12/6, and [name] has called [facility] to let them know what she needs. I advised pt that the pharmacist at [facility] had told me he would transfer all of her Rxs and contact us if he needs any Rxs sent to him. Pt voiced understanding.

## 2012-01-24 ENCOUNTER — Telehealth: Payer: Self-pay

## 2012-01-24 DIAGNOSIS — R21 Rash and other nonspecific skin eruption: Secondary | ICD-10-CM

## 2012-01-24 NOTE — Telephone Encounter (Signed)
Sam at [facility] is calling because he has gotten all of pt's Rxs transferred but there were no RFs on some of them and pt is asking for them. He needs new Rxs for her Zantac, Xopenex inhaler, Benedryl and Halcion which are all on her med list. She also is requesting her betamethasone DP 0.05% cream which I don't see on her list. Dr Everlene Farrier, I wasn't sure how many RFs you wanted to give pt on these esp since she has been in several times recently but for more acute issues. Please advise, or send Rxs to [place] if you agree to them.

## 2012-01-24 NOTE — Telephone Encounter (Signed)
She can have 5 refills on all of her medicine. It is also OK to call in the betamethasone cream .

## 2012-01-25 ENCOUNTER — Telehealth: Payer: Self-pay | Admitting: *Deleted

## 2012-01-25 DIAGNOSIS — L299 Pruritus, unspecified: Secondary | ICD-10-CM

## 2012-01-25 NOTE — Telephone Encounter (Signed)
Pharmacy requesting refills on hydroxyzine 25mg ,      remeron 45mg  soltab,      travatan z 0.004% eye drops      [facility] [address]

## 2012-01-25 NOTE — Telephone Encounter (Signed)
She should already have refills of the hydroxyzine and remeron. I called her to advise did not get answer.

## 2012-01-25 NOTE — Telephone Encounter (Signed)
She can have refills on the hydroxyzine and her Remeron however the Travatan needs to be prescribed by her eye specialist

## 2012-02-02 ENCOUNTER — Other Ambulatory Visit: Payer: Self-pay | Admitting: *Deleted

## 2012-02-02 DIAGNOSIS — L299 Pruritus, unspecified: Secondary | ICD-10-CM

## 2012-02-04 MED ORDER — BETAMETHASONE DIPROPIONATE AUG 0.05 % EX CREA: TOPICAL_CREAM | Freq: Two times a day (BID) | CUTANEOUS | Status: DC

## 2012-02-04 MED ORDER — TRIAZOLAM 0.25 MG PO TABS: ORAL_TABLET | ORAL | Status: DC

## 2012-02-04 MED ORDER — DIPHENHYDRAMINE HCL 50 MG PO CAPS: ORAL_CAPSULE | ORAL | Status: DC

## 2012-02-04 MED ORDER — LEVALBUTEROL TARTRATE 45 MCG/ACT IN AERO: INHALATION_SPRAY | RESPIRATORY_TRACT | Status: DC

## 2012-02-04 MED ORDER — ZANTAC 150 MG PO TABS: 150.0000 mg | ORAL_TABLET | Freq: Two times a day (BID) | ORAL | Status: DC

## 2012-02-04 NOTE — Telephone Encounter (Signed)
Sent in the 4 Rxs that could be done electronically and called and gave pharmacist Rx for Halcion. Pharmacist stated that he got the other 4.

## 2012-02-08 ENCOUNTER — Telehealth: Payer: Self-pay

## 2012-02-08 NOTE — Telephone Encounter (Signed)
Called pt and explained that the correct way to take the Benedryl is one tab TID and that Dr Everlene Farrier does not want her taking all of the Benedryl at the same time Qhs , esp w/the other sleeping medication that she is taking. Advised her that we have to leave the directions on the [place] as it is currently written. Pt voiced understanding.

## 2012-02-08 NOTE — Telephone Encounter (Signed)
We cannot change how the prescription for Benadryl is written. She already has a sleeping pill she also takes at night

## 2012-02-08 NOTE — Telephone Encounter (Signed)
Pt had called and LM on nurse VM stating that since the pharmacy is putting her Rxs in bubble packs, they are filling things she doesn't need. I called pt back and explained that while the Rxs are being transitioned over to bubble packs, she may get some Rxs in the packs that she still has in bottles left from previous months, but that is probably necessary until everything gets on the same schedule so that nothing is left out of the pack for each AM or PM doses. Pt stated that the pharmacy filled her inhaler and cream that she already had and I advised pt to call the pharmacist to discuss, or to not pick up the Rxs (that are not in the bubble packs) if she doesn't need them yet. Pt voiced understanding.  Dr Everlene Farrier, pt stated that she takes all three of her Benedryl at bedtime so that she can sleep, NOT 1 tab TID as Rx reads and they are in the bubble packs TID. Can we change the Rx to read "Take 3 tabs Qhs at bedtime" so they can be packaged this way?
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
## 2012-02-11 ENCOUNTER — Telehealth: Payer: Self-pay

## 2012-02-11 NOTE — Telephone Encounter (Signed)
Pt called and stated that she can't see some of her medications well enough in her bubble packs to know if she is taking the right thing and wants her hydroxyzine, triazolam and zantac put in bottles instead. She has called the pharmacy and they stated that her MD office would have to call them and ask them to put these in bottles. I explained that her CAPS worker wants all of her medications in bubble packs in order for her to retain services and that the purpose of bubble packs is so that she doesn't have to be able to see her medications that well in order to make sure she is taking her medications correctly. Pt was adamant that she needs to be able to see that she is taking the right medications.  I called Tammi, pt's CAPS case worker, and [facility] for her to CB to speak w/her about problems w/medications/packaging and see what they require.

## 2012-02-15 ENCOUNTER — Telehealth: Payer: Self-pay | Admitting: Physician Assistant

## 2012-02-15 DIAGNOSIS — H5710 Ocular pain, unspecified eye: Secondary | ICD-10-CM

## 2012-02-15 MED ORDER — KETOROLAC TROMETHAMINE 0.5 % OP SOLN: 1.0000 [drp] | Freq: Every day | OPHTHALMIC | Status: DC

## 2012-02-15 NOTE — Telephone Encounter (Signed)
LMOM for Tammi to CB to discuss call from pt and problems w/her medication.

## 2012-02-15 NOTE — Telephone Encounter (Signed)
Call from pharmacy requesting a refill of Ketorolac eye drops.    I asked that future requests be sent to use electronically.  Meds ordered this encounter  Medications  . ketorolac (ACULAR) 0.5 % ophthalmic solution    Sig: Place 1 drop into both eyes daily. One drop in her eyes once a day    Dispense:  5 mL    Refill:  1    Order Specific Question:  Supervising Provider    Answer:  DOOLITTLE, ROBERT P [id number]

## 2012-02-18 ENCOUNTER — Telehealth: Payer: Self-pay

## 2012-02-18 DIAGNOSIS — H5710 Ocular pain, unspecified eye: Secondary | ICD-10-CM

## 2012-02-18 NOTE — Telephone Encounter (Signed)
Helene Kelp from [facility] [employer] states that they sent over a electronic request for pts eye drops (Ketorlac) but has not received a response from [place] as of yet.  It looks as if [facility] drops [facility] [address] [facility] which a pharmacy that she is no longer using. Best# [id number] Fax: [phone]

## 2012-02-19 MED ORDER — KETOROLAC TROMETHAMINE 0.5 % OP SOLN: 1.0000 [drp] | Freq: Every day | OPHTHALMIC | Status: DC

## 2012-02-19 NOTE — Telephone Encounter (Signed)
Rx changed to [facility] and sent electronically.

## 2012-02-22 NOTE — Telephone Encounter (Signed)
Dr Everlene Farrier,  It doesn't appear that I am going to hear back from pt's CAPS worker to discuss their position on pt's medications having to be in bubble pack. What I have been told by Joellen Jersey from [place] is that CAPS is requiring ALL medications be in bubble packs, even those that are prn (they are put in separate bubble pack for that med only). The pt has not called me back concerning this. I'm sure that it is best for pt to retain her CAPS services, but ultimately I guess it is up to the pt to decide. Do you want me to wait to see if pt calls back and insists that the medications be put in bottles, or go ahead and call pharmacy and have the medications pt requested changed to bottles?

## 2012-02-22 NOTE — Telephone Encounter (Signed)
That she see if she would be agreeable to have a bubble packs. Let's just wait to hear from her.

## 2012-03-11 ENCOUNTER — Ambulatory Visit: Payer: Medicare Other | Admitting: Emergency Medicine

## 2012-03-11 ENCOUNTER — Telehealth: Payer: Self-pay

## 2012-03-11 NOTE — Telephone Encounter (Signed)
Patient needs to have [facility] [address] transfer from previous pharmacy, called patient to advise. No answer.

## 2012-03-11 NOTE — Telephone Encounter (Signed)
Wants dr. Everlene Farrier to transfer all rx medication to be sent to the [facility] [address]. If this is possible please call back.

## 2012-03-13 NOTE — Telephone Encounter (Signed)
Patient advised, she wants to know if she can transfer the cap service. I advised her she will have to do this, we can not.

## 2012-03-19 ENCOUNTER — Telehealth: Payer: Self-pay

## 2012-03-19 NOTE — Telephone Encounter (Signed)
Pt states that she has no more refills on her rx  Remeron. Pt would like for someone to call [facility] to make sure she receives a refill. Best# [phone]

## 2012-03-19 NOTE — Telephone Encounter (Signed)
[facility] also requesting refills on Remeron 45mg  odt.  Last fill 02/20/12

## 2012-03-20 MED ORDER — REMERON SOLTAB 45 MG PO TBDP: ORAL_TABLET | ORAL | Status: DC

## 2012-03-20 NOTE — Telephone Encounter (Signed)
Patient needs to reschedule her appointment at 104. Remeron she is on can be refilled until she didn't get an appointment. Be sure she stays at the same dose she has been on.

## 2012-03-20 NOTE — Telephone Encounter (Signed)
Sending in 1 mos RF of Remeron. Spoke w/pt who reported that she already has an appt w/Dr Everlene Farrier on 04/15/12, but may come to see him this weekend d/t an earache.

## 2012-03-23 ENCOUNTER — Ambulatory Visit (INDEPENDENT_AMBULATORY_CARE_PROVIDER_SITE_OTHER): Payer: Medicare Other | Admitting: Emergency Medicine

## 2012-03-23 ENCOUNTER — Ambulatory Visit: Payer: Medicare Other

## 2012-03-23 VITALS — BP 126/70 | HR 68 | Temp 98.1°F | Resp 18 | Ht <= 58 in | Wt 84.6 lb

## 2012-03-23 DIAGNOSIS — J3489 Other specified disorders of nose and nasal sinuses: Secondary | ICD-10-CM

## 2012-03-23 DIAGNOSIS — R634 Abnormal weight loss: Secondary | ICD-10-CM

## 2012-03-23 DIAGNOSIS — E538 Deficiency of other specified B group vitamins: Secondary | ICD-10-CM

## 2012-03-23 DIAGNOSIS — H9209 Otalgia, unspecified ear: Secondary | ICD-10-CM

## 2012-03-23 DIAGNOSIS — R51 Headache: Secondary | ICD-10-CM

## 2012-03-23 MED ORDER — CYANOCOBALAMIN 1000 MCG/ML IJ SOLN
1000.0000 ug | Freq: Once | INTRAMUSCULAR | Status: AC
Start: 2012-03-23 — End: 2012-03-23
  Administered 2012-03-23: 1000 ug via INTRAMUSCULAR

## 2012-03-23 NOTE — Progress Notes (Signed)
  Subjective:    Patient ID: Taylor Roth, female    DOB: [birth year], 79 y.o.   MRN: [id number]  HPI Patient presents with left ear pain.  Patient saw urologist recently and her bladder infection cleared up. Patient is also having a headache patient also has a nonhealing sore inside of her left nares. She's been using Bactroban cream but it does not help.    Review of Systems     Objective:   Physical Exam the left external auditory canal is filled with wax the right TM and canal are normal. Examination the nose revealed a thickened cracked area lateral portion of the nares. There is no true purulent drainage. Neck is supple chest is clear  [facility] reading (PRIMARY) by  Dr. Everlene Farrier sinus films are normal  .       Assessment & Plan:  We'll irrigate the left ear. Ahead and check films of the sinus. And go ahead and make an appointment to ENT to check the area inside her nose to see if the area should be biopsied. I told the patient she gives her pain medicine from Dr. Alfonso Ramus. I did not feel comfortable writing her for more pain medication for her headaches as he writes her pain medicines. We have had this discussion before.

## 2012-03-24 ENCOUNTER — Telehealth: Payer: Self-pay

## 2012-03-24 NOTE — Telephone Encounter (Signed)
SOMEONE CALLED HER AND SHE MISSED THE CALL... DOES NOT KNOW WHAT IT WAS ABOUT

## 2012-03-25 NOTE — Telephone Encounter (Signed)
Was referrals. I called patient to advise on this, her caretaker was given the number to call for directions and to reschedule if there is bad weather.

## 2012-04-11 ENCOUNTER — Telehealth: Payer: Self-pay

## 2012-04-11 MED ORDER — LEVALBUTEROL TARTRATE 45 MCG/ACT IN AERO: INHALATION_SPRAY | RESPIRATORY_TRACT | Status: DC

## 2012-04-11 NOTE — Telephone Encounter (Signed)
What meds does she need? She is asking for the Xopenex inhaler to [facility] pended for her.

## 2012-04-11 NOTE — Telephone Encounter (Signed)
PT STATES SHE DOESN'T HAVE HOME HEALTH CARE ANYMORE SO SHE NEED HER MEDS TO GO TO A PHARMACY CLOSER TO HER SO HER NEIGHBORS CAN P/U PLEASE CALL [phone] AND SHE IS OUT    [facility] [address]

## 2012-04-11 NOTE — Telephone Encounter (Signed)
Sent to pharmacy 

## 2012-04-15 ENCOUNTER — Encounter: Payer: Self-pay | Admitting: Emergency Medicine

## 2012-04-15 ENCOUNTER — Telehealth: Payer: Self-pay

## 2012-04-15 ENCOUNTER — Ambulatory Visit (INDEPENDENT_AMBULATORY_CARE_PROVIDER_SITE_OTHER): Payer: Medicare Other | Admitting: Emergency Medicine

## 2012-04-15 VITALS — BP 110/55 | HR 81 | Temp 97.8°F | Resp 18 | Ht <= 58 in | Wt 86.2 lb

## 2012-04-15 DIAGNOSIS — E538 Deficiency of other specified B group vitamins: Secondary | ICD-10-CM

## 2012-04-15 DIAGNOSIS — H547 Unspecified visual loss: Secondary | ICD-10-CM

## 2012-04-15 DIAGNOSIS — Z9119 Patient's noncompliance with other medical treatment and regimen: Secondary | ICD-10-CM

## 2012-04-15 DIAGNOSIS — Z91148 Patient's other noncompliance with medication regimen for other reason: Secondary | ICD-10-CM

## 2012-04-15 DIAGNOSIS — Z9114 Patient's other noncompliance with medication regimen: Secondary | ICD-10-CM

## 2012-04-15 DIAGNOSIS — R634 Abnormal weight loss: Secondary | ICD-10-CM

## 2012-04-15 MED ORDER — CYANOCOBALAMIN 1000 MCG/ML IJ SOLN
1000.0000 ug | Freq: Once | INTRAMUSCULAR | Status: AC
  Administered 2012-04-15: 1000 ug via INTRAMUSCULAR

## 2012-04-15 NOTE — Telephone Encounter (Signed)
Call daughter and let her know Dakira needs 24/7 care. Is she willing to have her come live with her ?? If so let me know and I will call and talk to her.
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
## 2012-04-15 NOTE — Telephone Encounter (Signed)
PT WAS SEEN BY DR. DAUB TODAY.  SHE TOLD HER DAUGHTER THAT DR. DAUB WANTED TO SPEAK TO HER.  [name] (THE DAUGHTER) IS GOING TO TRY AND BRING HER MOTHER BACK TO THE WALK IN TO SEE HIM.  SHE SAYS IF DAUB NEEDS TO SPEAK TO HER RIGHT AWAY, PLEASE CALL HER AT [phone]

## 2012-04-16 ENCOUNTER — Telehealth: Payer: Self-pay | Admitting: Emergency Medicine

## 2012-04-16 NOTE — Telephone Encounter (Signed)
Spoke to [place] and she is in [place] currently and can not come in to the office to meet with you she she is available March 22nd and would be available the beginning of the week. Daughter needs to know also what meds she needs. She is not taking the meds that have been prescribed.

## 2012-04-16 NOTE — Telephone Encounter (Signed)
I discussed the situation with the wheeze with her daughter [name] in [place]. I told the daughter I was very concerned that medications were not being taken appropriately. I told her that her situation at the house was acceptable and she needed 24 7 care either in a care facility here in [place] where if she chose to take her back in [place] that would be acceptable . The daughter was upset that she's being prescribed medications and states that there are medications all over the house that she has been hoarding.. I told her that an assessment of the home situation was very difficult and that she needed 24 7 care so we can monitor her medications and hopefully take her off some of the medications she is being prescribed.

## 2012-04-17 ENCOUNTER — Telehealth: Payer: Self-pay

## 2012-04-17 NOTE — Telephone Encounter (Signed)
Pt daughter [name] would like dr Everlene Farrier to call her regarding her mother is possible at [phone]

## 2012-04-17 NOTE — Telephone Encounter (Signed)
I have been on the phone and discussed the situation with the daughter who lives in [place]. I told the daughter Taylor Roth needs 24 7 care. This needs to be a care facility where she wanted to move with her daughter in [place] that would be fine. It is okay to refill the Remeron. Her eye doctor needs to be in charge of her eye drops. If the family wants to come in with [name]  and discuss her situation I will be happy to do so.

## 2012-04-17 NOTE — Telephone Encounter (Signed)
(  see below)Per grandson, he and his wife live with [place] full time. Roselyn Reef works but wife is home all day and an aide comes in daily from [employer]. He is not sure if Brina is getting confused.  She is almost out of her remeron Medical laboratory scientific officer) Would like to talk to Dr. Everlene Farrier if possible to reassure him about her care [phone].

## 2012-04-17 NOTE — Telephone Encounter (Signed)
Pt would like for dr Everlene Farrier to call her regarding one of her medications she has a nurse staying with her and would like to get this done while the nurse is there [phone]

## 2012-04-17 NOTE — Telephone Encounter (Signed)
[facility] sent over 2 med requests please advise. ketoraloc eye drop and remeron

## 2012-04-17 NOTE — Telephone Encounter (Signed)
I spoke with pt grandson Roselyn Reef cell [phone]) today. Glenn has informed him that Dr. Everlene Farrier will not fill any more of her meds and has talked to her about needing to go to a nursing home because she does not have any assistance at home. Per Roselyn Reef, he and his wife li

## 2012-04-21 ENCOUNTER — Other Ambulatory Visit: Payer: Self-pay

## 2012-04-21 MED ORDER — REMERON SOLTAB 45 MG PO TBDP: ORAL_TABLET | ORAL | Status: DC

## 2012-04-23 ENCOUNTER — Telehealth: Payer: Self-pay
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
## 2012-04-23 NOTE — Telephone Encounter (Signed)
Pamala Hurry   Please call patient [phone]

## 2012-04-23 NOTE — Telephone Encounter (Signed)
Pt called back again and I spoke with her. She wants clarification as to whether Dr Everlene Farrier wants her to find a new PCP. She stated that Dr Everlene Farrier said he didn't want to Rx her medications anymore and she thinks that he wants her to find a new doctor. Dr Everlene Farrier, please advise.

## 2012-04-23 NOTE — Telephone Encounter (Signed)
I told Taylor Roth I would continued to be her PCP but they needed to be 24 7 care for her in a nursing facility where her medications can be administered correctly. I also told her if she chose to move up [place] to be with her daughter I would be happy to work with them for that transition. She does need to be on 24 7 care.

## 2012-04-29 ENCOUNTER — Ambulatory Visit: Payer: Medicare Other | Admitting: Emergency Medicine

## 2012-05-08 ENCOUNTER — Ambulatory Visit (INDEPENDENT_AMBULATORY_CARE_PROVIDER_SITE_OTHER): Payer: Medicare Other | Admitting: Emergency Medicine

## 2012-05-08 VITALS — BP 98/62 | HR 58 | Temp 97.8°F | Resp 16 | Ht 59.0 in | Wt 83.0 lb

## 2012-05-08 DIAGNOSIS — Z79899 Other long term (current) drug therapy: Secondary | ICD-10-CM

## 2012-05-08 DIAGNOSIS — F341 Dysthymic disorder: Secondary | ICD-10-CM

## 2012-05-08 DIAGNOSIS — F411 Generalized anxiety disorder: Secondary | ICD-10-CM

## 2012-05-08 NOTE — Progress Notes (Signed)
  Subjective:    Patient ID: Taylor Roth, female    DOB: 12/07/1928, 79 y.o.   MRN: [id number]  HPI patient presents with her daughter to see about her medications. There've been issues at home regarding incorrect medication administration. We tried to have her use bubble packs and this was aided with [facility] hand however there was a disagreement between the 2 parties and angel hands now refuses to go back out. On her last visit the patient stated that she would have her daughter come down from [place] to get a plan to followup or she would move to [place]. She now presents with her daughter for    Review of Systems     Objective:   Physical Exam No examination was performed today       Assessment & Plan:  A final decision after long discussion was for the patient to have her medications laid out by her next-door neighbor on a daily basis. The neighbor would keep the medications locked up. Zyrihanna would have no access to these medications. And they could only be administered one day at a time. I called the pharmacist where she gets her medications and gave him this information that the medications could be released to her daughter from [place] and they would be administered by the next-door neighbor when the medications would be locked up.

## 2012-05-09 ENCOUNTER — Telehealth: Payer: Self-pay

## 2012-05-09 NOTE — Telephone Encounter (Signed)
Please call the caps Education officer, museum. I spoke with the daughter from [place] who was with Barbaraann Share yesterday. The agreement is that the next-door neighbor keep Shalunda's  medication locked up. Sabrea  would not have any access to the medication. The medication will be put out on a daily basis with only one day of medicine available. The only person to have access to the medication in a locked container will be the neighbor. the daughter has a good relationship with  this neighbor and the caps social worker should feel free to go and talk with her and be sure they also are comfortable with this plan. I think this would be a good way to keep the [place] out of a facility. I hope they would be able to continue services.

## 2012-05-09 NOTE — Telephone Encounter (Signed)
Taylor Roth, pt's CAPS SW called to check latest status of pt and her medications. She has a meeting to discuss pt's case today.  Advised her that pt was in yesterday w/her daughter and new plan was made for neighbor to lock up pt's meds and give pt meds needed for each day only. Taylor Roth asked which daughter came with pt to OV and if we have name or more info on which neighbor. CAPS may agree to continue her services temporarily to give plan trial period. Has neighbor agreed and trustworthy? Dr Everlene Farrier, can you provide this information? I'll be glad to call Taylor Roth back, if so.

## 2012-05-09 NOTE — Telephone Encounter (Signed)
LMOM on Tammy's VM w/details from Dr Everlene Farrier. Asked for CB w/further ?s/concerns.

## 2012-05-16 ENCOUNTER — Other Ambulatory Visit: Payer: Self-pay | Admitting: Physician Assistant

## 2012-05-20 ENCOUNTER — Telehealth: Payer: Self-pay

## 2012-05-20 NOTE — Telephone Encounter (Signed)
Taylor Roth called to report that they had a 2 hr mediation yesterday w/pt and family. Conclusion was that in order to keep CAPS services neighbor must actually administer medications to pt on schedule as prescribed and pt will have no access to medications and will not self-administer. Neighbor will have to keep a medication administration record which CAPS will check. Grandson's girlfriend Taylor Roth is supposed to be backup to neighbor for med administration. Taylor's understanding is that neighbor and Taylor Roth are coming to pt's next appt w/Dr Everlene Farrier. Taylor is not sure that neighbor is aware of the extent of what she will be responsible for and that she will be in agreement. Taylor reported that she is retiring on 06/02/12 and that new contact person at [facility] [place] [facility] will be her [place] [phone].

## 2012-06-03 ENCOUNTER — Ambulatory Visit (INDEPENDENT_AMBULATORY_CARE_PROVIDER_SITE_OTHER): Payer: Medicare Other | Admitting: Emergency Medicine

## 2012-06-03 ENCOUNTER — Encounter: Payer: Self-pay | Admitting: Emergency Medicine

## 2012-06-03 VITALS — BP 136/66 | HR 63 | Temp 98.1°F | Resp 16 | Ht 59.0 in | Wt 81.4 lb

## 2012-06-03 DIAGNOSIS — K649 Unspecified hemorrhoids: Secondary | ICD-10-CM

## 2012-06-03 DIAGNOSIS — H539 Unspecified visual disturbance: Secondary | ICD-10-CM

## 2012-06-03 DIAGNOSIS — F489 Nonpsychotic mental disorder, unspecified: Secondary | ICD-10-CM

## 2012-06-03 DIAGNOSIS — F99 Mental disorder, not otherwise specified: Secondary | ICD-10-CM

## 2012-06-03 DIAGNOSIS — F329 Major depressive disorder, single episode, unspecified: Secondary | ICD-10-CM

## 2012-06-03 DIAGNOSIS — E538 Deficiency of other specified B group vitamins: Secondary | ICD-10-CM

## 2012-06-03 MED ORDER — CYANOCOBALAMIN 1000 MCG/ML IJ SOLN
1000.0000 ug | Freq: Once | INTRAMUSCULAR | Status: AC
  Administered 2012-06-03: 1000 ug via INTRAMUSCULAR

## 2012-06-03 MED ORDER — HYDROCORTISONE 2.5 % RE CREA: TOPICAL_CREAM | RECTAL | Status: DC

## 2012-06-03 NOTE — Progress Notes (Signed)
  Subjective:    Patient ID: [name], female    DOB: 1928/05/01, 79 y.o.   MRN: [id number]  HPI 79 yo female with extensive medical history, seen today, accompanied by her neighbor, Carlyon Shadow, who has assumed responsibility for administering her medications. States that her medications are not dispensed correctly by her pharmacist. Wants refill for hemorrhoid medication.     Review of Systems     Objective:   Physical Exam patient is alert cooperative. Chest is clear. Heart regular rate no murmurs       Assessment & Plan:  Spoke with patient at length about plan for administration of medications. Plan is for neighbor to lay out medications one day at a time. Stressed that patient may not ask for any other medications. Informed that if she does not abide by plan, adult protective services will be called. Patient is not happy with plan, stating she can handle her own medications. Spoke with patient's daughter previously who helped devise this plan. Neighbor and daughter agree with plan. Patient then agreed to plan. The daughter who lives in [place] is also aware of this plan her name is [name]. B12 shot given today.

## 2012-06-04 ENCOUNTER — Other Ambulatory Visit: Payer: Self-pay | Admitting: Physician Assistant

## 2012-06-04 ENCOUNTER — Telehealth: Payer: Self-pay

## 2012-06-04 NOTE — Telephone Encounter (Signed)
Patient would like a refill on Clorazepate 7.5.  Please advise

## 2012-06-05 ENCOUNTER — Telehealth: Payer: Self-pay

## 2012-06-05 MED ORDER — CLORAZEPATE DIPOTASSIUM 7.5 MG PO TABS: 7.5000 mg | ORAL_TABLET | Freq: Three times a day (TID) | ORAL | Status: DC

## 2012-06-05 NOTE — Telephone Encounter (Signed)
Okay to refill the clorazepate. That prescription is to be picked up by her next-door neighbor [name] and this is the only person that can be given the prescription. She can have 5 refills. She can get the medication once a month.

## 2012-06-05 NOTE — Telephone Encounter (Signed)
She can have a refill. on her clorazepate if it is time. She gets her clorazepate every month. If her prescription has run out and she is on schedule for it to be refilled she can have a total of 5 refills.

## 2012-06-05 NOTE — Telephone Encounter (Signed)
She has refills on this until Nov. Called pharmacy. [facility] need to know when last filled. Can cancel refills then send to [facility]. [facility] for [facility] to [facility] me back

## 2012-06-05 NOTE — Telephone Encounter (Signed)
[facility] called to request RF of pt's Colonazepam. Dr Everlene Farrier, do you want to RF this?

## 2012-06-05 NOTE — Telephone Encounter (Signed)
November was last Rx. Refills cancelled, reprinted, sign so I can fax to her new pharmacy at [facility], has been faxed. Patients neighbor Carlyon Shadow will pick this up.

## 2012-06-10 NOTE — Telephone Encounter (Signed)
I spoke w/pharmacist because Rx was already sent in. He stated that pt's daughter Taylor Roth p/up the Rx. He was aware that the neighbor is administering medication, but is he only supposed to allow neighbor to p/up? He did make [name] count the medication in his presence so that she can not claim it was short. Dr Everlene Farrier, please advise if only neighbor can p/up so that I can call pharmacist back.

## 2012-06-10 NOTE — Telephone Encounter (Signed)
Called pharmacist back and gave him neighbor Taylor Roth' name and advised that she is the only one that should p/up medication. Then called Taylor Roth, per Dr Perfecto Kingdom request, to let her know that the clorazepate was p/up by Taylor Roth and counted at pharmacy by her. Taylor Roth stated she has not brought it to her yet, but she will check w/Taylor Roth to see where this medication is. Taylor Roth is also going to call [facility] and give them her information and may set up for medication to be delivered to her.

## 2012-06-10 NOTE — Telephone Encounter (Signed)
We'll call the pharmacy and let them know the only person that can be given Taylor Roth's medicine is her neighbor Taylor Roth. The medicine as not to be dispensed directly to Taylor Roth or to her daughter.

## 2012-06-16 ENCOUNTER — Other Ambulatory Visit: Payer: Self-pay

## 2012-06-16 DIAGNOSIS — L299 Pruritus, unspecified: Secondary | ICD-10-CM

## 2012-06-16 MED ORDER — HYDROXYZINE HCL 25 MG PO TABS: 25.0000 mg | ORAL_TABLET | Freq: Three times a day (TID) | ORAL | Status: DC | PRN

## 2012-06-17 ENCOUNTER — Other Ambulatory Visit: Payer: Self-pay | Admitting: Emergency Medicine

## 2012-06-17 NOTE — Telephone Encounter (Signed)
Please check with the pharmacist at [facility] and be sure it is time for the patient to get these prescriptions filled. These  prescriptions can only be picked up by her next-door neighbor [name]. They cannot be picked up by the patient or her daughter.

## 2012-06-18 ENCOUNTER — Telehealth: Payer: Self-pay | Admitting: Radiology

## 2012-06-18 NOTE — Telephone Encounter (Signed)
You can and given okay for refills but the refills are on schedule as the pharmacist already knows.

## 2012-06-18 NOTE — Telephone Encounter (Signed)
I have spoken with the pharmacist at [facility]. I had the neighbor Darleen come in with Leany and explained that one days worth of medication would be put out each day. I told the Keeya that she would not have access to her medication nor would any family member have access to her medication. The pharmacist knows not to fill any prescriptions early . It is my understanding this is the treatment plan we had agreed upon. The daughter who lives up [place] is also aware of this plan.

## 2012-06-18 NOTE — Telephone Encounter (Signed)
Taylor Roth # [phone] [id number].

## 2012-06-18 NOTE — Telephone Encounter (Signed)
Left message for Tammy to call me back she called earlier from health dept regarding patient.

## 2012-06-18 NOTE — Telephone Encounter (Signed)
During mediation the neighbor was to come in to the office visit with the neighbor. Grandsons girlfriend is trying to help with medications this is not working. Patient has very poor vision, and is taking her meds improperly still, at times she takes all 3 of her benadryl at the same time. Tammy states patient not safe to administer medications, patient being discharged from the med cap program. The neighbor not helpful with situation either. If patient not able to find someone who can help with her medications. Tammy needs copies of your notes sent to her with her plan sent [phone]. If there are any other concerns regarding patient she would like to know, do you have anything to add,other than what is in your notes?

## 2012-06-18 NOTE — Telephone Encounter (Signed)
Spoke w/Sam, pharmacist, and he stated that both Rxs were last filled on 06/04/12 and would not be filled until due. He was just sending RF requests to have on hold because there are no more RFs. He will send requests again when they due. Sam reported that when any Rxs are ready for pt, he will call neighbor to see if she wants to p/up or to be delivered only to her. He is being very careful to not fill Rxs even one day early and is having them counted upon receipt because pt continues to call and complain that she is short, etc (but only on the controlled Rxs). Dr Everlene Farrier, Juluis Rainier

## 2012-06-19 NOTE — Telephone Encounter (Signed)
Spoke w/pharmacist and gave him OK to put the Halcion and Benedryl  RFs on file to fill when due. Pharmacist agreed.

## 2012-06-19 NOTE — Telephone Encounter (Signed)
Please have the nurse talk with Taylor Roth and explain to her again that if she does not follow the agreed upon arrangement with administration of medicine from her next-door neighbor and then we will have to call Adult Protective Services as we have already told her

## 2012-06-19 NOTE — Telephone Encounter (Signed)
The nurse Tammy reports the neighbor is not doing her part in this. Patients grand sons girlfriend is trying to help, but unfortunately the nurse reports this is not the agreed upon plan.

## 2012-06-20 NOTE — Telephone Encounter (Signed)
Called pharmacy and asked pharmacy tech to give Sam the message of thanks from Dr Everlene Farrier.

## 2012-06-20 NOTE — Telephone Encounter (Signed)
Thanks the nurse has done this, have faxed your note to her

## 2012-06-26 ENCOUNTER — Telehealth: Payer: Self-pay

## 2012-06-26 NOTE — Telephone Encounter (Signed)
[name] is calling about county services for [employer]. [phone]

## 2012-06-26 NOTE — Telephone Encounter (Signed)
Fenton Malling is calling about services from the county for [employer]. [phone].

## 2012-06-26 NOTE — Telephone Encounter (Signed)
Taylor Roth is calling about services from the county for [employer]. [phone]

## 2012-06-26 NOTE — Telephone Encounter (Signed)
Taylor Roth is calling about services from the county for [employer]. [phone].

## 2012-06-27 NOTE — Telephone Encounter (Signed)
Daughter also states she will sign mom up for meals on wheels. Advise on message about services. The county is recommending 6 hrs/ daughter asking if you will approve her to go back to 3 hrs of care daily.

## 2012-06-27 NOTE — Telephone Encounter (Signed)
I am in support of what is recommended by the home health agency that saw her. We must continue to recommended   number of hours they feel are indicated

## 2012-06-27 NOTE — Telephone Encounter (Signed)
Left messages for both of them to call me back.

## 2012-06-27 NOTE — Telephone Encounter (Signed)
Spoke to Taylor Roth, patients daughter. They had hearing yesterday and have 2 weeks to resolve issues with her care. Daughter is asking for paperwork to be filled out request for her to go back to her old services, for care 3 hours a day. Daughter does not feel like she needs a sitter 6 hours a day, she states 3 hrs a day is "plenty" do you agree? Will you fill out the forms?

## 2012-06-30 ENCOUNTER — Telehealth: Payer: Self-pay

## 2012-06-30 NOTE — Telephone Encounter (Signed)
Amy, do we need to do anything else regarding this encounter?

## 2012-06-30 NOTE — Telephone Encounter (Signed)
Spoke to her daughter about this, unsure what forms they are sending/ she needs more than 3 hours of care daily.

## 2012-06-30 NOTE — Telephone Encounter (Signed)
No this is not done, I need to call both daughters. Will call this afternoon, then close.

## 2012-06-30 NOTE — Telephone Encounter (Signed)
Called Taylor Roth left message for her to call back. Called Taylor Roth (DE) and advised that the county has advised she needs more care than 3 hours. Daughter Taylor Roth advised county nurse is going to call us, have you talked to her?

## 2012-06-30 NOTE — Telephone Encounter (Signed)
If this is done, you can just close encounter.

## 2012-06-30 NOTE — Telephone Encounter (Signed)
PT WOULD LIKE TO KNOW IF DR DAUB IS GOING TO FILL OUT SOME PAPERS FOR HER AND AMY KNOWS ABOUT IT PLEASE CALL [phone]

## 2012-07-01 NOTE — Telephone Encounter (Signed)
I have not talked to the [place] but I would be happy to do so. I will be working at 104 all day if we want to contact her

## 2012-07-01 NOTE — Telephone Encounter (Signed)
Have we gotten any papers on this patient?

## 2012-07-02 NOTE — Telephone Encounter (Signed)
Im not sure. Exactly where are we expecting paperwork from? I can call and check the status on it.

## 2012-07-02 NOTE — Telephone Encounter (Signed)
Papers would be from [facility]. You do not have to call, but I expect they should have sent them by now.

## 2012-07-08 ENCOUNTER — Encounter: Payer: Self-pay | Admitting: Radiology

## 2012-07-08 ENCOUNTER — Telehealth: Payer: Self-pay | Admitting: Radiology

## 2012-07-08 ENCOUNTER — Telehealth: Payer: Self-pay

## 2012-07-08 DIAGNOSIS — M4120 Other idiopathic scoliosis, site unspecified: Secondary | ICD-10-CM | POA: Insufficient documentation

## 2012-07-08 NOTE — Telephone Encounter (Signed)
Pt daughter [name] called back and stated that Pamala Hurry had form and walked it to Dr. Everlene Farrier.  Im thinking they would like to know the status of the form because they do not want pt to have to go without care for to long.  Caps will be terminated at the end of the month. Pamala Hurry can you call [name] about this.  [phone]. You can disregard last message about calling Mrs. Larence Penning back tom since you had form already.

## 2012-07-08 NOTE — Telephone Encounter (Signed)
Mrs. Taylor Roth called from [employer] about receiving care.  They said that she can get about 80 hours a month through them.  She will be terminated from the caps program due to non-compliance of medications. She stated that she will resend a form over but form never came.  If we can call her in the morning (05/28) to get her to refax.  Her number is [phone].  I also spoke with pt daughter [name] and advised her that we were waiting on fax.

## 2012-07-08 NOTE — Telephone Encounter (Signed)
Pt is wanting someone to call her daughter about hours?? im not really sure what that means but that's all she kept saying. Her daughters name is Hassell Done phone is [phone] House [phone]

## 2012-07-08 NOTE — Telephone Encounter (Signed)
[name], patients daughter called again. I have advised her we have not gotten the paperwork. She states she will check on this. She states Caps service ends on 07/11/12. To you FYI

## 2012-07-09 NOTE — Telephone Encounter (Signed)
Called  and [facility] and also spoke w/[name] and reported to both that I gave the form to Amy who had been working on this w/Dr Everlene Farrier, and they completed it and faxed it in yesterday afternoon.

## 2012-07-10 ENCOUNTER — Ambulatory Visit: Payer: Medicare Other

## 2012-07-14 ENCOUNTER — Other Ambulatory Visit: Payer: Self-pay | Admitting: Emergency Medicine

## 2012-07-15 ENCOUNTER — Telehealth: Payer: Self-pay | Admitting: Radiology

## 2012-07-15 NOTE — Telephone Encounter (Signed)
Ilda Mori from [facility] called to check on fax of form to [facility]. Amy advised that she had faxed it twice on 07/08/12 and did not receive a confirmation of either success or failure of fax either time. Tammy asked that I fax form to her at [phone] and she can get it to [facility] if they need it. Faxed to Tammy w/confirmation.

## 2012-07-15 NOTE — Telephone Encounter (Signed)
The patient is nearly blind due to her macular degeneration. She is not capable of self administration of her medications. I do feel she should qualify for 24/ 7 care. She has been released by the caps program because they did not feel they could meet her needs

## 2012-07-15 NOTE — Telephone Encounter (Signed)
Thanks I have advised her

## 2012-07-15 NOTE — Telephone Encounter (Signed)
Is patient considered medically stable?  This means she does not need 24 hour care. I indicated to nurse Judeth Porch at home care agency 24 hour care has been recommended for the patient. Judeth Porch call back # is [phone]

## 2012-07-16 NOTE — Telephone Encounter (Signed)
This PPW was received, completed and faxed.

## 2012-07-23 ENCOUNTER — Telehealth: Payer: Self-pay

## 2012-07-23 NOTE — Telephone Encounter (Signed)
Dr Everlene Farrier, Amy is not here and I dont know anything about this. I know she needs 24 hour care but Im not sure what we would have to do to get her this help. Do you know anything about this? Please advise.

## 2012-07-23 NOTE — Telephone Encounter (Signed)
Patient wants to know why she is not able to get home healthcare through [facility]. [id number]

## 2012-07-24 NOTE — Telephone Encounter (Signed)
She was under the caps program. They felt like if they did not follow instructions she will be turned over to protective services. The case should have been turned over to protective services. The patient requires 24 7 care. Call the caps program and be sure that they notify protective services so that protective services is involved so she can get 24 7 care

## 2012-07-30 ENCOUNTER — Other Ambulatory Visit: Payer: Self-pay | Admitting: Emergency Medicine

## 2012-07-30 NOTE — Telephone Encounter (Signed)
The pharmacist has her on a monthly schedule. If she is on schedule she can have refills for 6 months.Her neighbor picks up her meds

## 2012-08-05 ENCOUNTER — Encounter: Payer: Self-pay | Admitting: Emergency Medicine

## 2012-08-05 ENCOUNTER — Ambulatory Visit (INDEPENDENT_AMBULATORY_CARE_PROVIDER_SITE_OTHER): Payer: Medicare Other | Admitting: Emergency Medicine

## 2012-08-05 VITALS — BP 107/59 | HR 61 | Temp 98.4°F | Resp 20 | Ht <= 58 in | Wt 78.8 lb

## 2012-08-05 DIAGNOSIS — E538 Deficiency of other specified B group vitamins: Secondary | ICD-10-CM

## 2012-08-05 DIAGNOSIS — M542 Cervicalgia: Secondary | ICD-10-CM

## 2012-08-05 DIAGNOSIS — K118 Other diseases of salivary glands: Secondary | ICD-10-CM

## 2012-08-05 DIAGNOSIS — F339 Major depressive disorder, recurrent, unspecified: Secondary | ICD-10-CM

## 2012-08-05 DIAGNOSIS — K649 Unspecified hemorrhoids: Secondary | ICD-10-CM

## 2012-08-05 DIAGNOSIS — Z79899 Other long term (current) drug therapy: Secondary | ICD-10-CM

## 2012-08-05 LAB — POCT CBC
Granulocyte percent: 67.3 %G (ref 37–80)
MCHC: 31.3 g/dL — AB (ref 31.8–35.4)
MID (cbc): 0.4 (ref 0–0.9)
POC Granulocyte: 4.6 (ref 2–6.9)
POC LYMPH PERCENT: 27.1 %L (ref 10–50)
POC MID %: 5.6 %M (ref 0–12)
Platelet Count, POC: 182 10*3/uL (ref 142–424)
RDW, POC: 15.1 %

## 2012-08-05 LAB — COMPREHENSIVE METABOLIC PANEL
AST: 25 U/L (ref 0–37)
Albumin: 4 g/dL (ref 3.5–5.2)
Alkaline Phosphatase: 77 U/L (ref 39–117)
Potassium: 4.4 mEq/L (ref 3.5–5.3)
Sodium: 145 mEq/L (ref 135–145)
Total Bilirubin: 0.3 mg/dL (ref 0.3–1.2)
Total Protein: 6 g/dL (ref 6.0–8.3)

## 2012-08-05 MED ORDER — HYDROCORTISONE 2.5 % RE CREA: TOPICAL_CREAM | RECTAL | Status: DC

## 2012-08-05 MED ORDER — CYANOCOBALAMIN 1000 MCG/ML IJ SOLN
1000.0000 ug | Freq: Once | INTRAMUSCULAR | Status: AC
  Administered 2012-08-05: 1000 ug via INTRAMUSCULAR

## 2012-08-05 NOTE — Progress Notes (Signed)
  Subjective:    Patient ID: Taylor Roth, female    DOB: 1928-05-20, 79 y.o.   MRN: [id number]  HPI patient enters for followup of a problem she has had recently where she all of a sudden gets a feeling in her mouth of fluid. She is not having pain she has not had any difficulty swallowing. She denies a sore throat or cough    Review of Systems     Objective:   Physical Exam patient has a severe kyphoscoliosis. Her TMs are clear her nose is normal the throat is clear chest is clear to auscultation. The salivary glands felt normal to me . She currently gets about 3 hours a day from [place]:  Her symptoms sounded most like salivary gland obstruction with spontaneous drainage today. He did not show any signs of infection. I set her up for ENT for them to evaluate her . She was given a B12 shot

## 2012-08-05 NOTE — Addendum Note (Signed)
Addended by: Yvette Rack on: 08/05/2012 02:40 PM   Modules accepted: Orders

## 2012-08-06 ENCOUNTER — Telehealth: Payer: Self-pay

## 2012-08-06 ENCOUNTER — Other Ambulatory Visit: Payer: Self-pay | Admitting: Emergency Medicine

## 2012-08-06 DIAGNOSIS — R1310 Dysphagia, unspecified: Secondary | ICD-10-CM

## 2012-08-06 NOTE — Telephone Encounter (Signed)
Please make an appointment for patient to see Dr. Earlean Shawl for evaluation of the difficulty she is having with swallowing please put in the order for an evaluation and let the patient know we are  making her an appointment to see Dr. Earlean Shawl. I will go ahead and put in the referral

## 2012-08-06 NOTE — Telephone Encounter (Signed)
Patient would like for Dr. Earlean Shawl to look at the lump on her throat.   [phone]

## 2012-08-07 NOTE — Telephone Encounter (Signed)
Thank you I have called her left message with her care taker to call me back.

## 2012-08-07 NOTE — Telephone Encounter (Signed)
Patients referral made.

## 2012-08-08 NOTE — Telephone Encounter (Signed)
Dr Everlene Farrier, I called and left message for former CAPS SW to call me back and then I was out of the office for over a week. I do not see that she called back and spoke to anyone else.   I called Tammy at [facility] again and was able to speak w/her. She reported that Adult Prot Services had been out to see the pt while CAPS was still involved. Tammy reported that APS did feel that pt could benefit from 24 hr care, the pt appeared competent and she refused 24 hr care, so there was nothing more APS could do. Also reported that pt called the police on Adult Protective Services.

## 2012-08-25 ENCOUNTER — Other Ambulatory Visit: Payer: Self-pay | Admitting: Emergency Medicine

## 2012-08-26 ENCOUNTER — Telehealth: Payer: Self-pay | Admitting: *Deleted

## 2012-08-26 ENCOUNTER — Other Ambulatory Visit: Payer: Self-pay

## 2012-08-26 MED ORDER — LEVALBUTEROL TARTRATE 45 MCG/ACT IN AERO: INHALATION_SPRAY | RESPIRATORY_TRACT | Status: DC

## 2012-08-26 NOTE — Telephone Encounter (Signed)
Faxed Rx (2) to [facility] @ 8:54 a.m.

## 2012-08-26 NOTE — Telephone Encounter (Signed)
She can have refills on all his medications to be refilled on a monthly basis x5. These prescriptions can only be picked up by her neighbor Carlyon Shadow will be in charge of dispensing the medications to [place]. The pharmacist at [facility] is well aware of how these medicines are to be dispensed.

## 2012-09-08 ENCOUNTER — Telehealth: Payer: Self-pay

## 2012-09-08 NOTE — Telephone Encounter (Signed)
Pt's daughter [name] called and wanted to talk with dr Everlene Farrier and also patient neighbor darlene hayes at [phone] because pt has called the police on ms hayes stating she took patient medication and both neighbor and daughter wants to know What to do next should the neighbor stop helping with the medication or what Daughter is about to go out of town but can be reached at [phone].

## 2012-09-09 NOTE — Telephone Encounter (Signed)
Please see the note I wrote regarding Ms. Gaylean.

## 2012-09-09 NOTE — Telephone Encounter (Signed)
Thanks. I have called daughter. [name] will not administer the medications any more, since patient called the police. Neighbor can not continue to take care of her any longer. Do you want me to call protective services?

## 2012-09-09 NOTE — Telephone Encounter (Signed)
Next door neighbor has not taken any of Taylor Roth,s medicines. We need to continue with the same program. Taylor Roth needs to be in charge of her medications. She is the only one that can administer the medications. If that plan is to be changed daily and her sister from [place] need to come into the office and discuss it with me. At the present time the same program will continue.

## 2012-09-09 NOTE — Telephone Encounter (Signed)
Please call protective services. Other than this is an emergency. The patient is not capable of administration of her home medications .

## 2012-09-10 NOTE — Telephone Encounter (Signed)
Taylor Roth indicates police did come to her home and questioned her about why she had patients medications there is apparently no further investigation associated with this. The police are aware patient is confused. I have advised patient if there is any further questions regarding this to have the police call me or Dr Everlene Farrier and we can explain why she has the patients medications. She indicates the police have assured her they do not think she stole them, they are aware she administers them for patient.

## 2012-09-10 NOTE — Telephone Encounter (Signed)
Called Taylor Roth Direct with Adult protective services and placed report for Community visit/ safety eval. Called neighbor Taylor Roth left message for her to call me back.  [phone] (H) [phone] (M)

## 2012-09-16 ENCOUNTER — Ambulatory Visit (INDEPENDENT_AMBULATORY_CARE_PROVIDER_SITE_OTHER): Payer: Medicare Other | Admitting: Emergency Medicine

## 2012-09-16 ENCOUNTER — Other Ambulatory Visit: Payer: Self-pay | Admitting: Emergency Medicine

## 2012-09-16 ENCOUNTER — Encounter: Payer: Self-pay | Admitting: Emergency Medicine

## 2012-09-16 VITALS — BP 120/68 | HR 65 | Temp 97.4°F | Resp 16 | Ht 58.5 in | Wt 78.9 lb

## 2012-09-16 DIAGNOSIS — E538 Deficiency of other specified B group vitamins: Secondary | ICD-10-CM

## 2012-09-16 DIAGNOSIS — F489 Nonpsychotic mental disorder, unspecified: Secondary | ICD-10-CM

## 2012-09-16 DIAGNOSIS — R634 Abnormal weight loss: Secondary | ICD-10-CM

## 2012-09-16 MED ORDER — CYANOCOBALAMIN 1000 MCG/ML IJ SOLN
1000.0000 ug | Freq: Once | INTRAMUSCULAR | Status: AC
  Administered 2012-09-16: 1000 ug via INTRAMUSCULAR

## 2012-09-16 NOTE — Progress Notes (Signed)
  Subjective:    Patient ID: Taylor Roth, female    DOB: [birth year], 79 y.o.   MRN: [id number]  HPI patient here for recheck today. She sees the eye specialist from [facility] in [place] on a regular basis because of her vision loss. She does not feel comfortable at home and does not allow her daughter Taylor Roth in the home. She also now feels that her grandson and his girlfriend also stealing from her. Currently medications are administered by the neighbor next door is also a patient of mine. Police became upset recently about missing money and so called the police. The police went to her neighbor's home and question her also . She states the home health agency people who come out to her home do not help at all .    Review of Systems she is currently seeing Taylor Roth because of problems passing large amounts of mucus out of her rectum. He plans an upcoming endoscopy of her colon to check this out to     Objective:   Physical Exam patient is alert and cooperative today she has a severe kyphoscoliosis. Her chest is clear her heart is regular and without murmurs abdomen is soft nontender        Assessment & Plan:  We'll contact [employer] to see if they can help with her situation. She is going to see Taylor Roth and have further evaluation of the mucus she passes per rectum . At the present time her next-door neighbor will continue to administer her medications. She is going to meet with her daughter from [place] and make a final decision as to whether she would be been better off in a care facility according to records she did have a colonoscopy in 2011

## 2012-09-17 ENCOUNTER — Telehealth: Payer: Self-pay | Admitting: Family Medicine

## 2012-09-17 ENCOUNTER — Encounter: Payer: Self-pay | Admitting: Family Medicine

## 2012-09-17 NOTE — Telephone Encounter (Signed)
Spoke with pharmacist at [facility] and patient requesting triazolam, clorazepate, halcion, and proctosol (4 tubes) she is not due fro refills until 8/17. He will hold until 8/14 then fill.

## 2012-09-17 NOTE — Telephone Encounter (Signed)
There are 2 prescriptions for the same drug on this request she can have a refill on Benadryl a refill on Halcion and a refill on Tranxene. She gets her medications once a month. She can have a total of 5 refills. But readjusts her request because they Halcion is requested twice

## 2012-09-17 NOTE — Telephone Encounter (Signed)
Patient calling to request refills on triazolam, clorazepate, and diphenhydramine. Chauncey please advise

## 2012-09-18 ENCOUNTER — Telehealth: Payer: Self-pay | Admitting: Radiology
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
## 2012-09-19 ENCOUNTER — Telehealth: Payer: Self-pay | Admitting: Radiology

## 2012-09-19 NOTE — Telephone Encounter (Signed)
Taylor Roth is called again/ patient is making multiple calls to the pharmacy again. Patient is asking for another neighbor to take over her meds. The phone number for this neighbor is [phone] his name is Al Klinard. This is not a good solution.

## 2012-09-19 NOTE — Telephone Encounter (Signed)
Dr Everlene Farrier has spoken to daughter [name].

## 2012-09-19 NOTE — Telephone Encounter (Signed)
Please call protective services. Please call her daughter in [place] and advise her of  the situation. Have the daughter call me and I will be happy to discuss with he. I cannot prescribe any medications and have  [place] self administer them . Call the pharmacist [place] and be sure these drugs are not released to Ms. Giorgio.

## 2012-09-19 NOTE — Telephone Encounter (Signed)
Please call this gentleman.  Please thank him for his caring and generosity, and his willingness to help Ms. Henegar in this situation.  However, we advise against his helping with her medications at this time.

## 2012-09-19 NOTE — Telephone Encounter (Signed)
Taylor Roth has called stating that the patient has called them to ask about putting her meds together and he is wanting to know what to do since we was told that dr Everlene Farrier has said this is not a good idea .  Best number [phone]

## 2012-09-19 NOTE — Telephone Encounter (Signed)
Dr Everlene Farrier has spoken to pharmacy regarding the patient. We can not dispense any medications to patient. All her Rx's have been cancelled. She is not able to get any medications at all from Dr. Everlene Farrier. I have advised patient.

## 2012-09-19 NOTE — Telephone Encounter (Signed)
Adult protective services have been advised.

## 2012-09-19 NOTE — Telephone Encounter (Signed)
Patients nurse called for patient she comes out daily. Nurse states she can not dispense any medications for this patient, per her nursing supervisor. The nurse I spoke to is Cyndia Bent. To you FYI Also have gotten call for [facility], orders being faxed to [place] for signature, April Todd will contact the patient within 10 days from [facility] will contact daughter [name] for additional help with this as well.

## 2012-09-19 NOTE — Telephone Encounter (Signed)
Patient called, wants to know if someone from social services can help fix her medication. [phone]

## 2012-09-20 ENCOUNTER — Telehealth: Payer: Self-pay | Admitting: Family Medicine

## 2012-09-20 ENCOUNTER — Telehealth: Payer: Self-pay | Admitting: Emergency Medicine

## 2012-09-20 NOTE — Telephone Encounter (Signed)
Patients daughter came in to speak to Dr. Everlene Farrier.  Spoke with patients daughter and she is concerned about her mother.  Been over there for four days from 8 am to 7pm.  She called the law on her daughter.  Mother is very mean.  Daughter and sister cannot agree on what to do with there mom.

## 2012-09-20 NOTE — Telephone Encounter (Signed)
Spoke to Al- advised we would not be able to discuss Taylor Roth medications with him at this time. He understands.

## 2012-09-20 NOTE — Telephone Encounter (Signed)
I discussed the case with Taylor Roth. I told her she could speak with the magistrate about commitment since she has POA.. I also agreed to call protective services to come back on Monday to do a reassessment.
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
## 2012-09-21 ENCOUNTER — Telehealth: Payer: Self-pay

## 2012-09-21 NOTE — Telephone Encounter (Signed)
Pt daughter [name] needs a call back from dr daub when it is best for him   Best number [phone]

## 2012-09-21 NOTE — Telephone Encounter (Signed)
Pt daughter spoke with Dr. Everlene Farrier.

## 2012-09-22 ENCOUNTER — Telehealth: Payer: Self-pay

## 2012-09-22 NOTE — Telephone Encounter (Signed)
PT WOULD LIKE TO SPEAK WITH SOMEONE REGARDING HER MEDS. PLEASE CALL [phone]

## 2012-09-22 NOTE — Telephone Encounter (Signed)
She can not have any meds until she finds a nurse to dispense them for her. Her neighbor can not do this for her.

## 2012-09-23 NOTE — Telephone Encounter (Signed)
Nelma Rothman called back she can not help patient. Nelma Rothman states she can put meds in the box but can not administer the meds. Advised her nurse needed to administer meds for patient.

## 2012-09-23 NOTE — Telephone Encounter (Signed)
Patient indicates social services in [place] is going to give her the medications. Taylor Roth is her name. Patient does not know her phone number, but will have [name] call me back.

## 2012-09-23 NOTE — Telephone Encounter (Signed)
Dr Everlene Farrier is advised, I must speak to [name].

## 2012-09-30 NOTE — Telephone Encounter (Signed)
Pt is calling again regarding  her medications that she requested that are listed below. She states that she has been out for about 2 weeks now and she talked with a lawyer who stated that we discontinue medications without a 30 day notice. Please advise.  Best# [phone]

## 2012-09-30 NOTE — Telephone Encounter (Signed)
Please advise 

## 2012-10-01 ENCOUNTER — Telehealth: Payer: Self-pay

## 2012-10-01 NOTE — Telephone Encounter (Signed)
PT STATES SHE HAVE BEEN OUT OF HER MEDICINE FOR 2 WEEKS NOW AND REALLY NEED HER MEDS. SHE WAS OUT TO SEE [place] A COUPLE OF WEEKS AGO AND THOUGHT FOR SURE SHE WOULD HAVE BEEN ABLE TO GET IT THEN. PLEASE CALL PT AT [phone]

## 2012-10-01 NOTE — Telephone Encounter (Signed)
Patient is calling again about her medication.

## 2012-10-01 NOTE — Telephone Encounter (Addendum)
Dr. Everlene Farrier will allow her medications once there is a nurse who can administer them to her.

## 2012-10-01 NOTE — Telephone Encounter (Signed)
Dr. Everlene Farrier has advised this patient that he will prescribe her medications as soon as she has a nurse who can administer them.

## 2012-10-02 NOTE — Telephone Encounter (Signed)
Pt wanted to let us know that she cannot afford a nurse to come out.  Best# [phone]

## 2012-10-03 NOTE — Telephone Encounter (Signed)
Pt CB and explained again that we can not Rx her medications unless/until she has a nurse that can administer them because it is not safe otherwise. Pt argued that she wants a 30 day notice that meds are going to be stopped. I advised that we have been working on a plan for her med administration for several months. Pt stated her daughter will be in town tomorrow and will expect her to have her meds. I advised pt that maybe her daughter can arrange for a nurse to administer the meds for her.

## 2012-10-03 NOTE — Telephone Encounter (Signed)
I have spoken to pt. See other phone message.

## 2012-10-06 ENCOUNTER — Telehealth: Payer: Self-pay | Admitting: Radiology

## 2012-10-06 NOTE — Telephone Encounter (Signed)
Noted. Will hold for Dr. Everlene Farrier as Juluis Rainier.
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
## 2012-10-08 ENCOUNTER — Telehealth: Payer: Self-pay | Admitting: Radiology

## 2012-10-08 NOTE — Telephone Encounter (Signed)
Done faxed through epic last 3 years

## 2012-10-08 NOTE — Telephone Encounter (Signed)
To you FYI

## 2012-10-08 NOTE — Telephone Encounter (Signed)
Patient wants her medical records sent to [facility], I advised her she will need to sign a release

## 2012-10-08 NOTE — Telephone Encounter (Signed)
Another phone call from patient. She indicated it is illegal for Dr Everlene Farrier to discontinue her medications, he should have given her a 30 day supply until she can find another Dr. I advised her Dr Everlene Farrier has not indicated he can not see her, he has advised it is not safe for [place] to renew her medication, she can certainly come in to see him. Patient states we are not trying hard enough to find a nurse for her, I have advised her I have called every agency I know, some multiple times, and none of them can help her. She indicates someone from Medicaid is coming out to see her this week. I advised I hope they can find someone to help her, to have them call me if they need anything. To you FYI

## 2012-10-08 NOTE — Telephone Encounter (Signed)
Thank you, I will look out for it and do it as fast as I can.

## 2012-10-10 NOTE — Telephone Encounter (Signed)
Please call the daughter and let her no I would agree that the patient should be seen at [facility] behavioral health . Now would be the best time because she is off of her medications as she can be reevaluated to see what are the appropriate medications for her to be on. She does have a thorough psychiatric evaluation to see what is the most appropriate and safest medications for her to be on. I will not be prescribing any medications until she has this evaluation .

## 2012-10-11 ENCOUNTER — Telehealth: Payer: Self-pay

## 2012-10-11 NOTE — Telephone Encounter (Signed)
PT WANTS TO KNOW WHY HER MEDICATION WAS "STOPPED"

## 2012-10-13 NOTE — Telephone Encounter (Signed)
Spoke to Taylor Roth, she had convinced her mother to go to [place] for psychiatric consult, then she changed her mind when they were on the way out the door for [facility]. [name] indicates her sister does not want to have the magistrate to declare her incompetent. [name] wants to know if you can call patient and let her know this is what you want her to do. I will be out of the office on Tuesday. Pamala Hurry knows situation, and can help

## 2012-10-13 NOTE — Telephone Encounter (Signed)
See previous. I will call her this afternoon

## 2012-10-13 NOTE — Telephone Encounter (Signed)
Thanks. I have called [name] left message for her to call me back.

## 2012-10-14 ENCOUNTER — Telehealth: Payer: Self-pay | Admitting: *Deleted

## 2012-10-14 ENCOUNTER — Telehealth: Payer: Self-pay | Admitting: Emergency Medicine

## 2012-10-14 NOTE — Telephone Encounter (Signed)
I will call Taylor Roth  and see if we can convince the Taylor Roth  To go to the hospital  for an emergent psychiatric evaluation and decision regarding  the appropriate medications for her to take after she has her evaluation. I would not be writing for any medications for Taylor Roth to

## 2012-10-14 NOTE — Telephone Encounter (Signed)
Patient called for Dr. Everlene Farrier regarding her medical records from [facility]. She stated that she spoke with Mariann Laster at [facility] and was told by her that she could not give medical records to her, that she could have Dr. Everlene Farrier request them. Patient also stated that she has not been getting the services from them needed. She stated services were  from  Oct. 1, 2013 to Jul 11, 2012. Patient advised  per Dr. Everlene Farrier to have her new doctor where she will be getting her medicines from to request her records. Patient stated ok.

## 2012-10-14 NOTE — Telephone Encounter (Signed)
I called and spoke with patient directly. I had already spoken to her daughter [name] and her daughter [name]. I advised her to go to call behavioral health and have an independent psychiatric evaluation to see which medications were appropriate for her. She said she was not going to do that. She said she would continue under a different primary care provider. I encouraged her to seek psychiatric help to get an adjustment on her medication. Patient refuses to have this done. I told her I would not be able to right for any medications for her because I did not feel it was a safe home situation for her to be in charge of her medications.

## 2012-10-14 NOTE — Telephone Encounter (Signed)
Dr Everlene Farrier called pt. See notes under his phone message to her.

## 2012-10-24 ENCOUNTER — Telehealth: Payer: Self-pay

## 2012-10-24 NOTE — Telephone Encounter (Signed)
I called Taylor Roth and discuss the situation with her. I told Taylor Roth that [place] would not receive any medications through our office. I advised her again to contact protective services or to consider talking with the magistrate about involuntary commitment for evaluation

## 2012-10-24 NOTE — Telephone Encounter (Signed)
Pt's daughter [name] is needing to talk with dr Everlene Farrier would not go into details  Best number [phone]

## 2012-10-27 ENCOUNTER — Other Ambulatory Visit: Payer: Self-pay | Admitting: Emergency Medicine

## 2012-10-27 ENCOUNTER — Telehealth: Payer: Self-pay

## 2012-10-27 MED ORDER — ENSURE COMPLETE SHAKE PO LIQD: 1.0000 | Freq: Two times a day (BID) | ORAL | Status: DC

## 2012-10-27 NOTE — Telephone Encounter (Signed)
It is okay to fill her Ensure Plus

## 2012-10-27 NOTE — Telephone Encounter (Signed)
PT IS TRYING TO GET HER ENSURE PLUS AND THEY WON'T FILL IT UNTIL THE DR WRITE IT OUT [place] [phone]

## 2012-10-27 NOTE — Telephone Encounter (Signed)
Sent in for her

## 2012-10-27 NOTE — Telephone Encounter (Signed)
Pended Rx for her Ensure. Please advise.

## 2012-10-29 ENCOUNTER — Telehealth: Payer: Self-pay

## 2012-10-29 NOTE — Telephone Encounter (Signed)
Pt requesting call back from Dr Wyonia Hough to know why he wont give her meds???    Best phone [phone]

## 2012-10-30 NOTE — Telephone Encounter (Signed)
Spoke with patient and advised she will need to be evaluated by behavioral health. She asked me to call [name] (daughter) and explain to her. I asked if [name] were to take her if she would go. She said yes. spoke with [name] and explained. She voiced understanding and will take her to [facility] for behavioral eval.

## 2012-10-31 ENCOUNTER — Telehealth: Payer: Self-pay

## 2012-10-31 MED ORDER — ENSURE COMPLETE SHAKE PO LIQD: 1.0000 | Freq: Two times a day (BID) | ORAL | Status: DC

## 2012-10-31 MED ORDER — SLIP-ON UNDERGARMENTS MISC: Status: DC

## 2012-10-31 NOTE — Telephone Encounter (Signed)
Pt is calling because someone called her and said that she would need a prescription to get her pull  Ups and her ensure she would like for Amy to call her back Call back number is [phone]

## 2012-10-31 NOTE — Telephone Encounter (Signed)
I sent in her ensure last week and have now sent in the under garments. She wants the undergarments to [facility] and the Ensure to [facility].   Pt notified by Guerry Bruin

## 2012-11-04 NOTE — Telephone Encounter (Signed)
Refaxed, my other fax did not go through

## 2012-11-04 NOTE — Telephone Encounter (Signed)
Please call the underwear garments RX into [place]  Fax  [phone] Phone  [phone]

## 2012-11-11 DIAGNOSIS — H401133 Primary open-angle glaucoma, bilateral, severe stage: Secondary | ICD-10-CM | POA: Insufficient documentation

## 2012-11-14 ENCOUNTER — Telehealth: Payer: Self-pay

## 2012-11-14 NOTE — Telephone Encounter (Signed)
Once again, she should go to [facility] ER for treatment, and evaluation for placement in behavioral health AND placement in long term care facility. Left message for her to call me back, so we can advise again, I don't know why the family does not understand this. To you FYI

## 2012-11-14 NOTE — Telephone Encounter (Signed)
Pt's daughter [name] [place] would like to have some recommendations for pts care. Best# [phone]

## 2012-11-14 NOTE — Telephone Encounter (Signed)
I did call the daughter and advised she had 2 options 1 to have her sister take out commitment papers for evaluation or to call adult protective services and have them do a reevaluation. I did advise her that she was no longer my patient and I did not write for any medications.

## 2012-11-21 ENCOUNTER — Encounter: Payer: Self-pay | Admitting: Radiology

## 2012-11-21 DIAGNOSIS — K6289 Other specified diseases of anus and rectum: Secondary | ICD-10-CM | POA: Insufficient documentation

## 2012-11-21 HISTORY — DX: Other specified diseases of anus and rectum: K62.89

## 2012-12-04 ENCOUNTER — Telehealth: Payer: Self-pay

## 2012-12-04 NOTE — Telephone Encounter (Signed)
Thank you Dr Everlene Farrier advised.

## 2012-12-04 NOTE — Telephone Encounter (Signed)
Pt called and I couldn't tell if she was upset or what but she wanted Dr Everlene Farrier to know she appreciated him dropping her meds, I'm not really sure what that was suppose to mean she wouldn't say much more Call back number is [phone]

## 2012-12-09 ENCOUNTER — Emergency Department (HOSPITAL_COMMUNITY): Payer: Medicare Other

## 2012-12-09 ENCOUNTER — Telehealth: Payer: Self-pay

## 2012-12-09 ENCOUNTER — Inpatient Hospital Stay (HOSPITAL_COMMUNITY)
Admission: EM | Admit: 2012-12-09 | Discharge: 2012-12-14 | DRG: 391 | Disposition: A | Discharge status: Home Health | Payer: Medicare Other | Attending: Internal Medicine | Admitting: Internal Medicine

## 2012-12-09 ENCOUNTER — Encounter (HOSPITAL_COMMUNITY): Payer: Self-pay | Admitting: Emergency Medicine

## 2012-12-09 DIAGNOSIS — N184 Chronic kidney disease, stage 4 (severe): Secondary | ICD-10-CM | POA: Diagnosis present

## 2012-12-09 DIAGNOSIS — R1032 Left lower quadrant pain: Secondary | ICD-10-CM | POA: Diagnosis not present

## 2012-12-09 DIAGNOSIS — F319 Bipolar disorder, unspecified: Secondary | ICD-10-CM | POA: Diagnosis present

## 2012-12-09 DIAGNOSIS — Q254 Congenital malformation of aorta unspecified: Secondary | ICD-10-CM

## 2012-12-09 DIAGNOSIS — H3530 Unspecified macular degeneration: Secondary | ICD-10-CM | POA: Diagnosis present

## 2012-12-09 DIAGNOSIS — E43 Unspecified severe protein-calorie malnutrition: Secondary | ICD-10-CM | POA: Diagnosis present

## 2012-12-09 DIAGNOSIS — F329 Major depressive disorder, single episode, unspecified: Secondary | ICD-10-CM | POA: Diagnosis present

## 2012-12-09 DIAGNOSIS — Z91199 Patient's noncompliance with other medical treatment and regimen due to unspecified reason: Secondary | ICD-10-CM

## 2012-12-09 DIAGNOSIS — IMO0002 Reserved for concepts with insufficient information to code with codable children: Secondary | ICD-10-CM

## 2012-12-09 DIAGNOSIS — I951 Orthostatic hypotension: Secondary | ICD-10-CM | POA: Diagnosis present

## 2012-12-09 DIAGNOSIS — I771 Stricture of artery: Secondary | ICD-10-CM

## 2012-12-09 DIAGNOSIS — F22 Delusional disorders: Secondary | ICD-10-CM | POA: Diagnosis present

## 2012-12-09 DIAGNOSIS — R7989 Other specified abnormal findings of blood chemistry: Secondary | ICD-10-CM | POA: Diagnosis present

## 2012-12-09 DIAGNOSIS — Z681 Body mass index (BMI) 19 or less, adult: Secondary | ICD-10-CM

## 2012-12-09 DIAGNOSIS — A088 Other specified intestinal infections: Principal | ICD-10-CM | POA: Diagnosis present

## 2012-12-09 DIAGNOSIS — H3589 Other specified retinal disorders: Secondary | ICD-10-CM | POA: Diagnosis present

## 2012-12-09 DIAGNOSIS — I739 Peripheral vascular disease, unspecified: Secondary | ICD-10-CM | POA: Diagnosis present

## 2012-12-09 DIAGNOSIS — Z66 Do not resuscitate: Secondary | ICD-10-CM | POA: Diagnosis present

## 2012-12-09 DIAGNOSIS — Z9089 Acquired absence of other organs: Secondary | ICD-10-CM

## 2012-12-09 DIAGNOSIS — R634 Abnormal weight loss: Secondary | ICD-10-CM | POA: Diagnosis present

## 2012-12-09 DIAGNOSIS — E539 Vitamin B deficiency, unspecified: Secondary | ICD-10-CM | POA: Diagnosis present

## 2012-12-09 DIAGNOSIS — R627 Adult failure to thrive: Secondary | ICD-10-CM | POA: Diagnosis present

## 2012-12-09 DIAGNOSIS — M810 Age-related osteoporosis without current pathological fracture: Secondary | ICD-10-CM | POA: Diagnosis present

## 2012-12-09 DIAGNOSIS — J449 Chronic obstructive pulmonary disease, unspecified: Secondary | ICD-10-CM | POA: Diagnosis present

## 2012-12-09 DIAGNOSIS — J4489 Other specified chronic obstructive pulmonary disease: Secondary | ICD-10-CM | POA: Diagnosis present

## 2012-12-09 DIAGNOSIS — E860 Dehydration: Secondary | ICD-10-CM | POA: Diagnosis present

## 2012-12-09 DIAGNOSIS — E876 Hypokalemia: Secondary | ICD-10-CM | POA: Diagnosis present

## 2012-12-09 DIAGNOSIS — F17200 Nicotine dependence, unspecified, uncomplicated: Secondary | ICD-10-CM | POA: Diagnosis present

## 2012-12-09 DIAGNOSIS — R636 Underweight: Secondary | ICD-10-CM | POA: Diagnosis present

## 2012-12-09 DIAGNOSIS — F32A Depression, unspecified: Secondary | ICD-10-CM | POA: Diagnosis present

## 2012-12-09 DIAGNOSIS — F411 Generalized anxiety disorder: Secondary | ICD-10-CM | POA: Diagnosis present

## 2012-12-09 DIAGNOSIS — Z9119 Patient's noncompliance with other medical treatment and regimen: Secondary | ICD-10-CM

## 2012-12-09 DIAGNOSIS — K5900 Constipation, unspecified: Secondary | ICD-10-CM | POA: Diagnosis not present

## 2012-12-09 DIAGNOSIS — N3289 Other specified disorders of bladder: Secondary | ICD-10-CM | POA: Diagnosis present

## 2012-12-09 DIAGNOSIS — F419 Anxiety disorder, unspecified: Secondary | ICD-10-CM | POA: Diagnosis present

## 2012-12-09 DIAGNOSIS — R112 Nausea with vomiting, unspecified: Secondary | ICD-10-CM | POA: Diagnosis present

## 2012-12-09 DIAGNOSIS — K219 Gastro-esophageal reflux disease without esophagitis: Secondary | ICD-10-CM | POA: Diagnosis present

## 2012-12-09 DIAGNOSIS — Z79899 Other long term (current) drug therapy: Secondary | ICD-10-CM

## 2012-12-09 DIAGNOSIS — I679 Cerebrovascular disease, unspecified: Secondary | ICD-10-CM | POA: Diagnosis present

## 2012-12-09 HISTORY — DX: Chronic kidney disease, stage 4 (severe): N18.4

## 2012-12-09 HISTORY — DX: Stricture of artery: I77.1

## 2012-12-09 LAB — URINALYSIS, ROUTINE W REFLEX MICROSCOPIC
Hgb urine dipstick: NEGATIVE
Ketones, ur: NEGATIVE mg/dL
Leukocytes, UA: NEGATIVE
Nitrite: NEGATIVE
Protein, ur: NEGATIVE mg/dL
Specific Gravity, Urine: 1.021 (ref 1.005–1.030)
Urobilinogen, UA: 0.2 mg/dL (ref 0.0–1.0)
pH: 7 (ref 5.0–8.0)

## 2012-12-09 LAB — TROPONIN I: Troponin I: <0.3 ng/mL (ref <0.30)

## 2012-12-09 LAB — COMPREHENSIVE METABOLIC PANEL
ALT: 23 U/L (ref 0–35)
AST: 29 U/L (ref 0–37)
Alkaline Phosphatase: 76 U/L (ref 39–117)
BUN: 26 mg/dL — ABNORMAL HIGH (ref 6–23)
CO2: 24 mEq/L (ref 19–32)
GFR calc Af Amer: 89 mL/min — ABNORMAL LOW (ref >90)
GFR calc non Af Amer: 77 mL/min — ABNORMAL LOW (ref >90)
Glucose, Bld: 114 mg/dL — ABNORMAL HIGH (ref 70–99)
Potassium: 3.3 mEq/L — ABNORMAL LOW (ref 3.5–5.1)
Sodium: 144 mEq/L (ref 135–145)
Total Bilirubin: 0.3 mg/dL (ref 0.3–1.2)
Total Protein: 5.8 g/dL — ABNORMAL LOW (ref 6.0–8.3)

## 2012-12-09 LAB — CBC WITH DIFFERENTIAL/PLATELET
Eosinophils Absolute: 0 10*3/uL (ref 0.0–0.7)
HCT: 36.8 % (ref 36.0–46.0)
Hemoglobin: 12.4 g/dL (ref 12.0–15.0)
Lymphs Abs: 1.2 10*3/uL (ref 0.7–4.0)
MCH: 30.1 pg (ref 26.0–34.0)
MCHC: 33.7 g/dL (ref 30.0–36.0)
MCV: 89.3 fL (ref 78.0–100.0)
Monocytes Relative: 7 % (ref 3–12)
Neutrophils Relative %: 77 % (ref 43–77)
RBC: 4.12 MIL/uL (ref 3.87–5.11)

## 2012-12-09 MED ORDER — CLORAZEPATE DIPOTASSIUM 3.75 MG PO TABS
7.5000 mg | ORAL_TABLET | Freq: Two times a day (BID) | ORAL | Status: DC
  Administered 2012-12-09 – 2012-12-14 (×9): 7.5 mg via ORAL
  Filled 2012-12-09 (×9): qty 2

## 2012-12-09 MED ORDER — IBUPROFEN 600 MG PO TABS
600.0000 mg | ORAL_TABLET | Freq: Four times a day (QID) | ORAL | Status: DC | PRN
  Filled 2012-12-09 (×2): qty 1

## 2012-12-09 MED ORDER — POTASSIUM CHLORIDE 20 MEQ/15ML (10%) PO LIQD
40.0000 meq | Freq: Once | ORAL | Status: AC
  Administered 2012-12-09: 40 meq via ORAL
  Filled 2012-12-09: qty 30

## 2012-12-09 MED ORDER — ONDANSETRON HCL 4 MG PO TABS: 4.0000 mg | ORAL_TABLET | Freq: Four times a day (QID) | ORAL | Status: DC | PRN

## 2012-12-09 MED ORDER — DEXTROSE-NACL 5-0.45 % IV SOLN
INTRAVENOUS | Status: DC
  Administered 2012-12-09: 17:00:00 via INTRAVENOUS

## 2012-12-09 MED ORDER — TRIAZOLAM 0.125 MG PO TABS
0.1250 mg | ORAL_TABLET | Freq: Every evening | ORAL | Status: DC | PRN
  Administered 2012-12-09 – 2012-12-13 (×4): 0.125 mg via ORAL
  Filled 2012-12-09 (×5): qty 1

## 2012-12-09 MED ORDER — TRAVOPROST (BAK FREE) 0.004 % OP SOLN
1.0000 [drp] | Freq: Every day | OPHTHALMIC | Status: DC
  Administered 2012-12-09 – 2012-12-13 (×5): 1 [drp] via OPHTHALMIC
  Filled 2012-12-09 (×2): qty 2.5

## 2012-12-09 MED ORDER — ENOXAPARIN SODIUM 40 MG/0.4ML ~~LOC~~ SOLN
40.0000 mg | SUBCUTANEOUS | Status: DC
  Administered 2012-12-09: 40 mg via SUBCUTANEOUS
  Filled 2012-12-09 (×2): qty 0.4

## 2012-12-09 MED ORDER — HYDROXYZINE HCL 25 MG PO TABS
25.0000 mg | ORAL_TABLET | Freq: Three times a day (TID) | ORAL | Status: DC | PRN
  Filled 2012-12-09: qty 1

## 2012-12-09 MED ORDER — ONDANSETRON HCL 4 MG/2ML IJ SOLN: 4.0000 mg | Freq: Four times a day (QID) | INTRAMUSCULAR | Status: DC | PRN

## 2012-12-09 MED ORDER — HYDROCODONE-IBUPROFEN 7.5-200 MG PO TABS: 1.0000 | ORAL_TABLET | Freq: Four times a day (QID) | ORAL | Status: DC | PRN

## 2012-12-09 MED ORDER — FAMOTIDINE 20 MG PO TABS
20.0000 mg | ORAL_TABLET | Freq: Two times a day (BID) | ORAL | Status: DC
  Administered 2012-12-10 – 2012-12-11 (×3): 20 mg via ORAL
  Filled 2012-12-09 (×6): qty 1

## 2012-12-09 MED ORDER — IOHEXOL 300 MG/ML  SOLN
50.0000 mL | Freq: Once | INTRAMUSCULAR | Status: AC | PRN
  Administered 2012-12-09: 50 mL via ORAL

## 2012-12-09 MED ORDER — SODIUM CHLORIDE 0.9 % IV BOLUS (SEPSIS)
500.0000 mL | Freq: Once | INTRAVENOUS | Status: AC
  Administered 2012-12-09: 500 mL via INTRAVENOUS

## 2012-12-09 MED ORDER — IPRATROPIUM-ALBUTEROL 20-100 MCG/ACT IN AERS
1.0000 | INHALATION_SPRAY | Freq: Three times a day (TID) | RESPIRATORY_TRACT | Status: DC
  Administered 2012-12-09: 1 via RESPIRATORY_TRACT
  Filled 2012-12-09: qty 4

## 2012-12-09 MED ORDER — ENSURE COMPLETE SHAKE PO LIQD: 1.0000 | Freq: Two times a day (BID) | ORAL | Status: DC

## 2012-12-09 MED ORDER — ENSURE COMPLETE PO LIQD
237.0000 mL | Freq: Two times a day (BID) | ORAL | Status: DC
  Administered 2012-12-09: 237 mL via ORAL

## 2012-12-09 MED ORDER — HYDROCORTISONE 2.5 % RE CREA
TOPICAL_CREAM | Freq: Two times a day (BID) | RECTAL | Status: DC
  Administered 2012-12-10 – 2012-12-11 (×3): via RECTAL
  Filled 2012-12-09 (×2): qty 28.35

## 2012-12-09 MED ORDER — MIRTAZAPINE 30 MG PO TBDP
45.0000 mg | ORAL_TABLET | Freq: Every day | ORAL | Status: DC
  Administered 2012-12-09 – 2012-12-13 (×4): 45 mg via ORAL
  Filled 2012-12-09 (×6): qty 1

## 2012-12-09 MED ORDER — FLUTICASONE PROPIONATE HFA 110 MCG/ACT IN AERO
1.0000 | INHALATION_SPRAY | Freq: Two times a day (BID) | RESPIRATORY_TRACT | Status: DC
  Filled 2012-12-09: qty 12

## 2012-12-09 MED ORDER — IOHEXOL 300 MG/ML  SOLN
80.0000 mL | Freq: Once | INTRAMUSCULAR | Status: AC | PRN
  Administered 2012-12-09: 60 mL via INTRAVENOUS

## 2012-12-09 MED ORDER — LEVALBUTEROL TARTRATE 45 MCG/ACT IN AERO
1.0000 | INHALATION_SPRAY | Freq: Four times a day (QID) | RESPIRATORY_TRACT | Status: DC | PRN
  Filled 2012-12-09: qty 15

## 2012-12-09 MED ORDER — ONDANSETRON HCL 4 MG/2ML IJ SOLN
4.0000 mg | Freq: Once | INTRAMUSCULAR | Status: AC
  Administered 2012-12-09: 4 mg via INTRAVENOUS
  Filled 2012-12-09: qty 2

## 2012-12-09 MED ORDER — TIMOLOL MALEATE 0.5 % OP SOLN
1.0000 [drp] | Freq: Every day | OPHTHALMIC | Status: DC
  Administered 2012-12-09: 1 [drp] via OPHTHALMIC
  Filled 2012-12-09: qty 5

## 2012-12-09 NOTE — Progress Notes (Signed)
   CARE MANAGEMENT ED NOTE 12/09/2012  Patient:  MACALA, PROKOSCH   Account Number:  [id number]  Date Initiated:  12/09/2012  Documentation initiated by:  Jackelyn Poling  Subjective/Objective Assessment:   79 yr old female medicare/medicaid  pt from home with n/v/d CM consult for need for w/c admission to [place] as observation for nausea     Subjective/Objective Assessment Detail:     Action/Plan:   CM spoke with pt and dtr Mrs Ebony Hail at bedside Pt has home services and choice is for [facility]) to provide DME to include w/c and bedside commode CM left a voice message for Jeanella [facility] DME staff at [phone]   Action/Plan Detail:   UR completed   Anticipated DC Date:  12/10/2012     Status Recommendation to Physician:   Result of Recommendation:    Other ED Services  Consult Working [place]  Other  Outpatient Services - Pt will follow up   [place]  Resumption Of Svcs/PTA Provider   Choice offered to / List presented to:    DME arranged  [place]     DME agency  [place].        Status of service:  Completed, signed off  ED Comments:   ED Comments Detail:

## 2012-12-09 NOTE — Telephone Encounter (Signed)
PT'S DAUGHTER [name] WOULD LIKE TO SPEAK WITH DR.DAUB REGARDING HER MOM GETTING A MENTAL HEALTH ASSESMENT. BEST# [phone]

## 2012-12-09 NOTE — Telephone Encounter (Signed)
Called to advise this is the behavioral health clinic. To you FYI she is at [facility].

## 2012-12-09 NOTE — Progress Notes (Signed)
UR completed 

## 2012-12-09 NOTE — ED Notes (Signed)
Bed: WA20 Expected date:  Expected time:  Means of arrival:  Comments: NVD

## 2012-12-09 NOTE — Telephone Encounter (Signed)
[name] STATES WE HAD REFERRED HER MOM TO SOMEONE AT [facility] AND SHE REFUSED, SHE IS NOW AT [place] AND THEY AREN'T SURE HOW LONG SHE IS GOING TO BE THERE BUT SHE WOULD LIKE TO KNOW THE NAME OF THE PERSON WE HAD REFERRED HER SO SHE CAN GET IN TOUCH WITH THEM. PLEASE CALL [phone]

## 2012-12-09 NOTE — ED Notes (Signed)
Per EMS-pt from home with c/o of failure to thrive, nausea, vomiting x2 days. Hx of GERD. CBG 136. Denies fever.

## 2012-12-09 NOTE — ED Provider Notes (Signed)
CSN: [id number]     Arrival date & time 12/09/12  0946 History   First MD Initiated Contact with Patient 12/09/12 [phone]     Chief Complaint  Patient presents with  . Failure To Thrive  . Nausea  . Emesis   (Consider location/radiation/quality/duration/timing/severity/associated sxs/prior Treatment) Patient is a 79 y.o. female presenting with weakness. The history is provided by the patient (pt has been nauseated and weak with vomiitng for 3 days.  she was unable to walk today).  Weakness This is a new problem. The current episode started more than 2 days ago. The problem occurs constantly. The problem has not changed since onset.Pertinent negatives include no chest pain, no abdominal pain and no headaches. Nothing aggravates the symptoms. Nothing relieves the symptoms.    Past Medical History  Diagnosis Date  . Osteoporosis   . Depression   . Anxiety   . COPD (chronic obstructive pulmonary disease)   . GERD (gastroesophageal reflux disease)   . Abuse     benzos/narcotics  . Migraines   . Scoliosis   . Insomnia, unspecified   . Vitamin B deficiency   . Weight loss   . Chronic mental illness    Past Surgical History  Procedure Laterality Date  . Cholecystectomy    . Partial hysterectomy    . Abdominal hysterectomy     Family History  Problem Relation Age of Onset  . Stroke Mother   . Diabetes Mother   . Cancer Father 55    esophageal   History  Substance Use Topics  . Smoking status: Current Every Day Smoker -- 0.50 packs/day    Types: Cigarettes  . Smokeless tobacco: Never Used  . Alcohol Use: No   OB History   Grav Para Term Preterm Abortions TAB SAB Ect Mult Living                 Review of Systems  Constitutional: Negative for appetite change and fatigue.  HENT: Negative for congestion, ear discharge and sinus pressure.   Eyes: Negative for discharge.  Respiratory: Negative for cough.   Cardiovascular: Negative for chest pain.  Gastrointestinal:  Positive for nausea and vomiting. Negative for abdominal pain and diarrhea.  Genitourinary: Negative for frequency and hematuria.  Musculoskeletal: Negative for back pain.  Skin: Negative for rash.  Neurological: Positive for weakness. Negative for seizures and headaches.  Psychiatric/Behavioral: Negative for hallucinations.    Allergies  Macrobid; Azithromycin; Doxycycline; Escitalopram oxalate; Fioricet; Latex; Levofloxacin; Oxycodone; Restasis; Sulfa antibiotics; Tylenol; and Biaxin  Home Medications   Current Outpatient Rx  Name  Route  Sig  Dispense  Refill  . augmented betamethasone dipropionate (DIPROLENE-AF) 0.05 % cream   Topical   Apply topically 2 (two) times daily.   50 g   4   . clorazepate (TRANXENE) 7.5 MG tablet      TAKE 1 TABLET BY MOUTH THREE   TIMES DAILY   90 tablet   0   . COMBIVENT 18-103 MCG/ACT inhaler      inhale 1-2 puffs four times a day (MAXIMUM OF 12 PUFFS PER DAY)   1 Inhaler   2   . diclofenac (FLECTOR) 1.3 % PTCH   Transdermal   Place 1 patch onto the skin 2 (two) times daily.         . diphenhydrAMINE (BENADRYL) 50 MG capsule      TAKE 1 CAPSULE BY MOUTH THREE TIMES DAILY   90 capsule   0   . fexofenadine (  ALLEGRA) 60 MG tablet   Oral   Take 60 mg by mouth daily.         . fluconazole (DIFLUCAN) 150 MG tablet      take 1 tablet by mouth every week if needed for YEAST INFECTION   4 tablet   5   . fluticasone (FLOVENT HFA) 110 MCG/ACT inhaler   Inhalation   Inhale 1 puff into the lungs 2 (two) times daily.   1 Inhaler   5   . Hydrocodone-Ibuprofen (VICOPROFEN PO)   Oral   Take 7.5-200 mg by mouth every 8 (eight) hours as needed.          . hydrocortisone (PROCTOSOL HC) 2.5 % rectal cream      apply twice a day to affected area   56.7 g   5   . hydrOXYzine (ATARAX/VISTARIL) 25 MG tablet   Oral   Take 1 tablet (25 mg total) by mouth 3 (three) times daily as needed.   90 tablet   10   . Incontinence Supply  Disposable (SLIP-ON UNDERGARMENTS) MISC      Use as needed daily   22 each   12   . Ipratropium-Albuterol (COMBIVENT) 20-100 MCG/ACT AERS respimat   Inhalation   Inhale 1 puff into the lungs 3 (three) times daily.   1 Inhaler   5   . ketorolac (ACULAR) 0.5 % ophthalmic solution   Both Eyes   Place 1 drop into both eyes daily. One drop in her eyes once a day   5 mL   1   . levalbuterol (XOPENEX HFA) 45 MCG/ACT inhaler      Inhale 2 puffs every 4-6 hours as needed   1 Inhaler   1   . nitroGLYCERIN (NITROSTAT) 0.4 MG SL tablet   Sublingual   Place 0.4 mg under the tongue every 5 (five) minutes as needed.         . Nutritional Supplements (ENSURE COMPLETE SHAKE) LIQD   Oral   Take 1 Bottle by mouth 2 (two) times daily.   60 Bottle   11   . REMERON SOLTAB 45 MG disintegrating tablet      Dissolve 1 tablet on tongue with or without water at bedtime.   30 tablet   5     Dispense as written.   . timolol (TIMOPTIC) 0.5 % ophthalmic solution   Left Eye   Place 1 drop into the left eye daily.         . travoprost, benzalkonium, (TRAVATAN) 0.004 % ophthalmic solution   Both Eyes   Place 1 drop into both eyes at bedtime.         . triazolam (HALCION) 0.25 MG tablet      TAKE TWO   TABLETS BY MOUTH DAILY AT BEDTIME   60 tablet   0   . valACYclovir (VALTREX) 500 MG tablet      take 1 tablet by mouth once daily   30 tablet   11   . ZANTAC 150 MG tablet      TAKE 1 TABLET (150 MG TOTAL) BY MOUTH TWO   (TWO) TIMES DAILY.   60 tablet   3     Dispense as written.    BP 129/84  Pulse 74  Temp(Src) 97.8 F (36.6 C) (Oral)  Resp 17  SpO2 100% Physical Exam  Constitutional: She is oriented to person, place, and time. She appears well-developed.  HENT:  Head: Normocephalic.  Mucous membranes  dry  Eyes: Conjunctivae and EOM are normal. No scleral icterus.  Neck: Neck supple. No thyromegaly present.  Cardiovascular: Normal rate and regular rhythm.  Exam  reveals no gallop and no friction rub.   No murmur heard. Pulmonary/Chest: No stridor. She has no wheezes. She has no rales. She exhibits no tenderness.  Abdominal: She exhibits no distension. There is no tenderness. There is no rebound.  Musculoskeletal: Normal range of motion. She exhibits no edema.  Moderate weakness to all extremities  Lymphadenopathy:    She has no cervical adenopathy.  Neurological: She is oriented to person, place, and time. She exhibits normal muscle tone. Coordination normal.  Skin: No rash noted. No erythema.  Psychiatric: She has a normal mood and affect. Her behavior is normal.    ED Course  Procedures (including critical care time) Labs Review Labs Reviewed  COMPREHENSIVE METABOLIC PANEL - Abnormal; Notable for the following:    Potassium 3.3 (*)    Glucose, Bld 114 (*)    BUN 26 (*)    Total Protein 5.8 (*)    GFR calc non Af Amer 77 (*)    GFR calc Af Amer 89 (*)    All other components within normal limits  URINALYSIS, ROUTINE W REFLEX MICROSCOPIC - Abnormal; Notable for the following:    APPearance CLOUDY (*)    All other components within normal limits  CBC WITH DIFFERENTIAL  LIPASE, BLOOD  TROPONIN I   Imaging Review Dg Chest 2 View  12/09/2012   CLINICAL DATA:  FAILURE TO THRIVE NAUSEA EMESIS, COPD, smoker  EXAM: CHEST  2 VIEW  COMPARISON:  09/23/2011  FINDINGS: Lungs are hyperinflated. Sigmoid scoliosis noted. Normal cardiac silhouette. No effusion, infiltrate, or pneumothorax.  IMPRESSION: Hyperinflated lungs without acute findings.   Electronically Signed   By: Suzy Bouchard M.D.   On: 12/09/2012 10:36   Ct Head Wo Contrast  12/09/2012   CLINICAL DATA:  Nausea, vomiting, failure to thrive.  EXAM: CT HEAD WITHOUT CONTRAST  TECHNIQUE: Contiguous axial images were obtained from the base of the skull through the vertex without intravenous contrast.  COMPARISON:  MRI 03/08/2009  FINDINGS: There is atrophy and chronic small vessel disease  changes. No acute intracranial abnormality. Specifically, no hemorrhage, hydrocephalus, mass lesion, acute infarction, or significant intracranial injury. No acute calvarial abnormality. Visualized paranasal sinuses and mastoids clear. Orbital soft tissues unremarkable.  IMPRESSION: No acute intracranial abnormality.  Atrophy, chronic microvascular disease.   Electronically Signed   By: Rolm Baptise M.D.   On: 12/09/2012 10:51   Ct Abdomen Pelvis W Contrast  12/09/2012   CLINICAL DATA:  Two days of nausea and vomiting and dysphagia. There is history of COPD and reflux disease.  EXAM: CT ABDOMEN AND PELVIS WITH CONTRAST  TECHNIQUE: Multidetector CT imaging of the abdomen and pelvis was performed using the standard protocol following bolus administration of intravenous contrast. The patient also received oral contrast material.  CONTRAST:  48mL OMNIPAQUE IOHEXOL 300 MG/ML SOLN and oral contrast material  COMPARISON:  None.  FINDINGS: The liver exhibits no focal mass. There is mild intrahepatic ductal dilation consistent with previous cholecystectomy. Surgical clips are present in the gallbladder fossa. The common bile duct and pancreas exhibit no acute abnormalities. The spleen is not enlarged. The stomach is moderately distended with gas and fluid. The duodenum is not abnormally distended. The partially contrast filled loops of small bowel appear normal. There is some gas within proximal small bowel loops. There is fluid and gas  within the colon. There does not appear to be colonic obstruction or acute inflammation.  There are no adrenal masses. The kidneys enhance well with no evidence of stones or obstruction. The caliber of the abdominal aorta is normal but its course is tortuous. There is no evidence of a psoas abscess. The urinary bladder is quite distended despite the presence of the Foley catheter. The rectosigmoid colon appears normal. There is no free fluid in the pelvis. The uterus is surgically absent.  There are no adnexal masses. On delayed images contrast within the renal collecting systems is normal.  There is generalized wasting of the subcutaneous fat consistent with the patient's failure to thrive.  At lung window settings there is basilar atelectasis posteriorly on the left. The patient has known lumbar levoscoliosis with significant disc space narrowing at multiple levels.  IMPRESSION: 1. There is mild intrahepatic ductal dilation consistent with previous cholecystectomy. There is no hepatosplenomegaly nor evidence of masses within these organs. The pancreas is atrophic without evidence of suspicious masses.  2. There is no evidence of the small or large bowel obstruction. There is a moderate amount of gas within the transverse and descending portions of the colon without evidence of obstruction.  3. There is no evidence of urinary tract obstruction. There is distention of the urinary bladder however. Correlation as to the functioning of the foley catheter is needed.   Electronically Signed   By: David  Martinique   On: 12/09/2012 14:09    EKG Interpretation     Ventricular Rate:  77 PR Interval:  124 QRS Duration: 97 QT Interval:  446 QTC Calculation: 505 R Axis:   -64 Text Interpretation:  Sinus rhythm LAD, consider left anterior fascicular block Abnormal R-wave progression, early transition Prolonged QT interval            MDM   1. Dehydration        Maudry Diego, MD 12/09/12 1449

## 2012-12-09 NOTE — Telephone Encounter (Signed)
Please call [facility]. The patient had been dismissed from our practice. She should not have received any medications from our office. Be sure they no longer give any medications to the patient from our officep

## 2012-12-09 NOTE — H&P (Signed)
[name] History and Physical  [name] [id number] DOB: [birth year] DOA: 12/09/2012  Referring physician: Dr. Roderic Palau PCP: Jenny Reichmann, MD  Specialists: none  Chief Complaint: weakness  HPI: Taylor Roth is a 79 y.o. female has a past medical history significant for COPD, Depression, anxiety, prior substance abuse, presents to the ED with a chief complaint of weakness. She has been having severe nausea for the past 3 days and was unable to eat much (per daughter she only had 2 cups of yoghurt in 2 days) and was unable to drink enough water. She has no appetite due to her nausea. She denies any problems swallowing her food and denies pain with eating. She has no chest pain or abdominal pain. She has no diarrhea. Denies fever or chills. She denies dysuria. Today she felt too weak to even walk and was unable to answer the door to her house. Daughter also endorses weight loss, she was 78 lbs in August, 77 in September and just couple of weeks ago she was down to 73. She has a history of tobacco abuse and is still smoking. Patient states that she "quit 3 days ago" however daughter states that this is due to the fact that she is too weak to get to her "smoking room" in her house. In the ED labs remarkable for mild dehydration.  Review of Systems: as per HPI otherwise negative.  Past Medical History  Diagnosis Date  . Osteoporosis   . Depression   . Anxiety   . COPD (chronic obstructive pulmonary disease)   . GERD (gastroesophageal reflux disease)   . Abuse     benzos/narcotics  . Migraines   . Scoliosis   . Insomnia, unspecified   . Vitamin B deficiency   . Weight loss   . Chronic mental illness    Past Surgical History  Procedure Laterality Date  . Cholecystectomy    . Partial hysterectomy    . Abdominal hysterectomy     Social History:  reports that she has been smoking Cigarettes.  She has been smoking about 0.50 packs per day. She has never used smokeless  tobacco. She reports that she does not drink alcohol or use illicit drugs.  Allergies  Allergen Reactions  . Macrobid [Nitrofurantoin Macrocrystal]     Patient developed a rash on the medication  . Azithromycin   . Doxycycline   . Escitalopram Oxalate   . Fioricet [Butalbital-Apap-Caffeine]     Drunk.  . Latex   . Levofloxacin   . Oxycodone     reaction to synthetic codeine  . Restasis [Cyclosporine]   . Sulfa Antibiotics   . Tylenol [Acetaminophen]     jaundice  . Biaxin [Clarithromycin] Rash    With burning sensation    Family History  Problem Relation Age of Onset  . Stroke Mother   . Diabetes Mother   . Cancer Father 68    esophageal   Prior to Admission medications   Medication Sig Start Date End Date Taking? Authorizing Provider  augmented betamethasone dipropionate (DIPROLENE-AF) 0.05 % cream Apply topically 2 (two) times daily. 02/04/12   Darlyne Russian, MD  clorazepate (TRANXENE) 7.5 MG tablet TAKE 1 TABLET BY MOUTH THREE   TIMES DAILY 08/25/12   Darlyne Russian, MD  COMBIVENT 18-103 MCG/ACT inhaler inhale 1-2 puffs four times a day (MAXIMUM OF 12 PUFFS PER DAY) 12/14/11   Ryan Lyn Hollingshead, PA-C  diclofenac (FLECTOR) 1.3 % PTCH Place 1 patch onto the  skin 2 (two) times daily.    Historical Provider, MD  diphenhydrAMINE (BENADRYL) 50 MG capsule TAKE 1 CAPSULE BY MOUTH THREE TIMES DAILY 08/25/12   Darlyne Russian, MD  fexofenadine (ALLEGRA) 60 MG tablet Take 60 mg by mouth daily.    Historical Provider, MD  fluconazole (DIFLUCAN) 150 MG tablet take 1 tablet by mouth every week if needed for YEAST INFECTION 06/20/11   Areta Haber Dunn, PA-C  fluticasone (FLOVENT HFA) 110 MCG/ACT inhaler Inhale 1 puff into the lungs 2 (two) times daily. 12/27/11   Darlyne Russian, MD  Hydrocodone-Ibuprofen (VICOPROFEN PO) Take 7.5-200 mg by mouth every 8 (eight) hours as needed.     Historical Provider, MD  hydrocortisone (PROCTOSOL HC) 2.5 % rectal cream apply twice a day to affected area 08/05/12   Darlyne Russian, MD  hydrOXYzine (ATARAX/VISTARIL) 25 MG tablet Take 1 tablet (25 mg total) by mouth 3 (three) times daily as needed. 06/16/12   Darlyne Russian, MD  Incontinence Supply Disposable (SLIP-ON UNDERGARMENTS) MISC Use as needed daily 10/31/12   Darlyne Russian, MD  Ipratropium-Albuterol (COMBIVENT) 20-100 MCG/ACT AERS respimat Inhale 1 puff into the lungs 3 (three) times daily. 12/27/11   Darlyne Russian, MD  ketorolac (ACULAR) 0.5 % ophthalmic solution Place 1 drop into both eyes daily. One drop in her eyes once a day 02/19/12   Fara Chute, PA-C  levalbuterol [facility] HFA) 45 MCG/ACT inhaler Inhale 2 puffs every 4-6 hours as needed 08/26/12   Darlyne Russian, MD  nitroGLYCERIN (NITROSTAT) 0.4 MG SL tablet Place 0.4 mg under the tongue every 5 (five) minutes as needed.    Historical Provider, MD  Nutritional Supplements (ENSURE COMPLETE SHAKE) LIQD Take 1 Bottle by mouth 2 (two) times daily. 10/31/12   Darlyne Russian, MD  REMERON SOLTAB 45 MG disintegrating tablet Dissolve 1 tablet on tongue with or without water at bedtime. 04/21/12   Darlyne Russian, MD  timolol (TIMOPTIC) 0.5 % ophthalmic solution Place 1 drop into the left eye daily.    Historical Provider, MD  travoprost, benzalkonium, (TRAVATAN) 0.004 % ophthalmic solution Place 1 drop into both eyes at bedtime.    Historical Provider, MD  triazolam (HALCION) 0.25 MG tablet TAKE TWO   TABLETS BY MOUTH DAILY AT BEDTIME 08/25/12   Darlyne Russian, MD  valACYclovir (VALTREX) 500 MG tablet take 1 tablet by mouth once daily 09/25/11   Darlyne Russian, MD  ZANTAC 150 MG tablet TAKE 1 TABLET (150 MG TOTAL) BY MOUTH TWO   (TWO) TIMES DAILY. 07/14/12   Darlyne Russian, MD   Physical Exam: Filed Vitals:   12/09/12 1027 12/09/12 1115 12/09/12 1259 12/09/12 1515  BP: 151/73 180/83 129/84 130/87  Pulse: 71 75 74 86  Temp:      TempSrc:      Resp: 14 12 17 20   SpO2: 100% 100% 100% 100%     General:  No apparent distress, very cachectic and frail CF  Eyes:  no scleral  icterus  ENT: dry oropharynx; edentulous   Neck: supple, no JVD  Cardiovascular: regular rate without MRG; 2+ peripheral pulses  Respiratory: CTA biL, good air movement without wheezing, rhonchi or crackled  Abdomen: soft, non tender to palpation, positive bowel sounds, no guarding, no rebound  Skin: no rashes  Musculoskeletal: no peripheral edema  Psychiatric: normal mood and affect  Neurologic: non focal  Labs on Admission:  Basic Metabolic Panel:  Recent Labs Lab 12/09/12  1105  NA 144  K 3.3*  CL 110  CO2 24  GLUCOSE 114*  BUN 26*  CREATININE 0.74  CALCIUM 8.9   Liver Function Tests:  Recent Labs Lab 12/09/12 1105  AST 29  ALT 23  ALKPHOS 76  BILITOT 0.3  PROT 5.8*  ALBUMIN 3.7    Recent Labs Lab 12/09/12 1105  LIPASE 28   CBC:  Recent Labs Lab 12/09/12 1105  WBC 7.4  NEUTROABS 5.7  HGB 12.4  HCT 36.8  MCV 89.3  PLT 177   Cardiac Enzymes:  Recent Labs Lab 12/09/12 1105  TROPONINI <0.30   Radiological Exams on Admission: Dg Chest 2 View  12/09/2012   CLINICAL DATA:  FAILURE TO THRIVE NAUSEA EMESIS, COPD, smoker  EXAM: CHEST  2 VIEW  COMPARISON:  09/23/2011  FINDINGS: Lungs are hyperinflated. Sigmoid scoliosis noted. Normal cardiac silhouette. No effusion, infiltrate, or pneumothorax.  IMPRESSION: Hyperinflated lungs without acute findings.   Electronically Signed   By: Suzy Bouchard M.D.   On: 12/09/2012 10:36   Ct Head Wo Contrast  12/09/2012   CLINICAL DATA:  Nausea, vomiting, failure to thrive.  EXAM: CT HEAD WITHOUT CONTRAST  TECHNIQUE: Contiguous axial images were obtained from the base of the skull through the vertex without intravenous contrast.  COMPARISON:  MRI 03/08/2009  FINDINGS: There is atrophy and chronic small vessel disease changes. No acute intracranial abnormality. Specifically, no hemorrhage, hydrocephalus, mass lesion, acute infarction, or significant intracranial injury. No acute calvarial abnormality.  Visualized paranasal sinuses and mastoids clear. Orbital soft tissues unremarkable.  IMPRESSION: No acute intracranial abnormality.  Atrophy, chronic microvascular disease.   Electronically Signed   By: Rolm Baptise M.D.   On: 12/09/2012 10:51   Ct Abdomen Pelvis W Contrast  12/09/2012   CLINICAL DATA:  Two days of nausea and vomiting and dysphagia. There is history of COPD and reflux disease.  EXAM: CT ABDOMEN AND PELVIS WITH CONTRAST  TECHNIQUE: Multidetector CT imaging of the abdomen and pelvis was performed using the standard protocol following bolus administration of intravenous contrast. The patient also received oral contrast material.  CONTRAST:  25mL OMNIPAQUE IOHEXOL 300 MG/ML SOLN and oral contrast material  COMPARISON:  None.  FINDINGS: The liver exhibits no focal mass. There is mild intrahepatic ductal dilation consistent with previous cholecystectomy. Surgical clips are present in the gallbladder fossa. The common bile duct and pancreas exhibit no acute abnormalities. The spleen is not enlarged. The stomach is moderately distended with gas and fluid. The duodenum is not abnormally distended. The partially contrast filled loops of small bowel appear normal. There is some gas within proximal small bowel loops. There is fluid and gas within the colon. There does not appear to be colonic obstruction or acute inflammation.  There are no adrenal masses. The kidneys enhance well with no evidence of stones or obstruction. The caliber of the abdominal aorta is normal but its course is tortuous. There is no evidence of a psoas abscess. The urinary bladder is quite distended despite the presence of the Foley catheter. The rectosigmoid colon appears normal. There is no free fluid in the pelvis. The uterus is surgically absent. There are no adnexal masses. On delayed images contrast within the renal collecting systems is normal.  There is generalized wasting of the subcutaneous fat consistent with the patient's  failure to thrive.  At lung window settings there is basilar atelectasis posteriorly on the left. The patient has known lumbar levoscoliosis with significant disc space narrowing at  multiple levels.  IMPRESSION: 1. There is mild intrahepatic ductal dilation consistent with previous cholecystectomy. There is no hepatosplenomegaly nor evidence of masses within these organs. The pancreas is atrophic without evidence of suspicious masses.  2. There is no evidence of the small or large bowel obstruction. There is a moderate amount of gas within the transverse and descending portions of the colon without evidence of obstruction.  3. There is no evidence of urinary tract obstruction. There is distention of the urinary bladder however. Correlation as to the functioning of the foley catheter is needed.   Electronically Signed   By: David  Martinique   On: 12/09/2012 14:09    EKG: Independently reviewed.  Assessment/Plan Principal Problem:   Intractable nausea and vomiting Active Problems:   Depression   Anxiety   COPD (chronic obstructive pulmonary disease)   GERD (gastroesophageal reflux disease)   Macular degeneration   Failure to thrive   Nausea/vomiting  - unclear etiology, ?gastroenteritis. No abdominal pain, no diarrhea.  - supportive tx with antiemetics, IVF Weakness - due to #1, FTT, weight loss and looks like progressive decline.  - PT consult COPD  - continue inhalers. Anxiety  - continue home meds, lower dose Hypokalemia - due to #1, replete and recheck in am Tobacco abuse  - patient states that she "aready quit"   Diet: full liquid, pending SLP Fluids: NS DVT Prophylaxis: Lovenox  Code Status: DNR  Family Communication: daughter in the room  Disposition Plan: observation  Time spent: 74  Costin M. Cruzita Lederer, MD [name] Pager [phone]  If 7PM-7AM, please contact night-coverage [email or web address] Password [facility] 12/09/2012, 3:19 PM

## 2012-12-10 DIAGNOSIS — E43 Unspecified severe protein-calorie malnutrition: Secondary | ICD-10-CM

## 2012-12-10 DIAGNOSIS — J449 Chronic obstructive pulmonary disease, unspecified: Secondary | ICD-10-CM

## 2012-12-10 DIAGNOSIS — E860 Dehydration: Secondary | ICD-10-CM

## 2012-12-10 DIAGNOSIS — I951 Orthostatic hypotension: Secondary | ICD-10-CM

## 2012-12-10 DIAGNOSIS — E876 Hypokalemia: Secondary | ICD-10-CM

## 2012-12-10 HISTORY — DX: Unspecified severe protein-calorie malnutrition: E43

## 2012-12-10 LAB — COMPREHENSIVE METABOLIC PANEL
Albumin: 3.4 g/dL — ABNORMAL LOW (ref 3.5–5.2)
Alkaline Phosphatase: 74 U/L (ref 39–117)
BUN: 16 mg/dL (ref 6–23)
Chloride: 104 mEq/L (ref 96–112)
GFR calc Af Amer: >90 mL/min (ref >90)
Glucose, Bld: 111 mg/dL — ABNORMAL HIGH (ref 70–99)
Potassium: 2.7 mEq/L — CL (ref 3.5–5.1)
Total Bilirubin: 0.4 mg/dL (ref 0.3–1.2)

## 2012-12-10 LAB — CBC
MCH: 30.6 pg (ref 26.0–34.0)
MCHC: 34.5 g/dL (ref 30.0–36.0)
MCV: 88.8 fL (ref 78.0–100.0)
Platelets: 169 10*3/uL (ref 150–400)
RDW: 13.1 % (ref 11.5–15.5)
WBC: 9.1 10*3/uL (ref 4.0–10.5)

## 2012-12-10 MED ORDER — ENOXAPARIN SODIUM 30 MG/0.3ML ~~LOC~~ SOLN
30.0000 mg | SUBCUTANEOUS | Status: DC
  Administered 2012-12-10 – 2012-12-12 (×3): 30 mg via SUBCUTANEOUS
  Filled 2012-12-10 (×5): qty 0.3

## 2012-12-10 MED ORDER — BOOST PLUS PO LIQD
237.0000 mL | Freq: Three times a day (TID) | ORAL | Status: DC
  Administered 2012-12-10: 237 mL via ORAL
  Filled 2012-12-10 (×2): qty 237

## 2012-12-10 MED ORDER — POTASSIUM CHLORIDE IN NACL 20-0.9 MEQ/L-% IV SOLN
INTRAVENOUS | Status: DC
  Administered 2012-12-10 – 2012-12-12 (×3): via INTRAVENOUS
  Filled 2012-12-10 (×5): qty 1000

## 2012-12-10 MED ORDER — POTASSIUM CHLORIDE 10 MEQ/100ML IV SOLN
10.0000 meq | INTRAVENOUS | Status: AC
  Administered 2012-12-10 (×4): 10 meq via INTRAVENOUS
  Filled 2012-12-10 (×4): qty 100

## 2012-12-10 MED ORDER — POLYETHYL GLYCOL-PROPYL GLYCOL 0.4-0.3 % OP SOLN: 1.0000 [drp] | OPHTHALMIC | Status: DC | PRN

## 2012-12-10 MED ORDER — DORZOLAMIDE HCL-TIMOLOL MAL 2-0.5 % OP SOLN
1.0000 [drp] | Freq: Two times a day (BID) | OPHTHALMIC | Status: DC
  Administered 2012-12-10 – 2012-12-14 (×9): 1 [drp] via OPHTHALMIC
  Filled 2012-12-10 (×2): qty 10

## 2012-12-10 NOTE — Telephone Encounter (Signed)
All her prescriptions were cancelled at the pharmacy. Called pharmacy to verify. They open at 9am, left message for call back

## 2012-12-10 NOTE — Care Management Note (Signed)
Per assessment of CM in ED pt has home services provided by [facility] 2 hours daily Mon-Sat. Pt and daughter requesting DME. Awaiting MD orders for wheelchair and bedside commode. No other needs assessed at this time. Awaiting PT eval for further dc planning.   [name],RN,MSN [phone]

## 2012-12-10 NOTE — Progress Notes (Signed)
[name] PROGRESS NOTE  Taylor Roth [id number] DOB: 10-27-1928 DOA: 12/09/2012 PCP: Jenny Reichmann, MD  Assessment/Plan: 1. Failure to thrive/functional decline. Likely secondary to profound dehydration. She is orthostatic, will continue IV fluid hydration with normal saline at 75 ML's per hour. Plan to advance her diet today. Consult physical therapy, change her to inpatient status, as I anticipate discharge to skilled nursing facility for rehabilitation. 2. Nausea/vomiting. Likely secondary to viral gastroenteritis. Reports feeling better in this regard, diet was advanced. 3. Dehydration. As I mentioned above, patient orthostatic with a 20 point drop in her systolic blood pressure from 142 laying to 123 on standing. Continue IV fluid hydration.  4. Protein calorie malnutrition. Consult nutrition, start patient on protein boost by mouth 3 times a day. 5. Hypokalemia. Replaced this morning. Followup on a potassium level. I suspect secondary to GI loss from history of nausea vomiting. 6. Chronic obstructive pulmonary disease. Stable from a respiratory standpoint. Continue as needed inhalers. 7. DVT prophylaxis. Lovenox subcutaneous  Code Status: DO NOT RESUSCITATE Family Communication: I have dated patient's daughter who was present at bedside Disposition Plan: Physical therapy consultation, social work consult for her skilled nursing facility placement. Patient is deconditioned, resides alone and would be unsafe at home.   Consultants:  Physical therapy   HPI/Subjective: Patient is a pleasant 79 year old female with a history of COPD, who was admitted to the medicine service on 12/09/2012, presenting with functional decline, generalized weakness, minimal by mouth intake. She was started on normal saline at 75 ML's per hour, with no acute events overnight. This mornings lab work showed a potassium of 2.7 which was replaced. Patient remained orthostatic this morning with a 20  point drop in systolic blood pressure going from laying to standing. Her diet was advanced, as if she was tolerating clears for me.  Objective: Filed Vitals:   12/10/12 0550  BP: 134/63  Pulse: 58  Temp: 98 F (36.7 C)  Resp: 20    Intake/Output Summary (Last 24 hours) at 12/10/12 1351 Last data filed at 12/10/12 0010  Gross per 24 hour  Intake      0 ml  Output    950 ml  Net   -950 ml   Filed Weights   12/09/12 1547  Weight: 33.113 kg (73 lb)    Exam:   General:  Patient is in no acute distress, she is tolerating clears. Denies further episodes of nausea vomiting  Cardiovascular: Regular rate and rhythm normal S1-S2  Respiratory: Diminished breath sounds bilaterally, few scattered expiratory wheezes  Abdomen: Soft nontender nondistended positive bowel sounds  Musculoskeletal: She has significant muscle atrophy, no edema  Data Reviewed: Basic Metabolic Panel:  Recent Labs Lab 12/09/12 1105 12/10/12 0330  NA 144 139  K 3.3* 2.7*  CL 110 104  CO2 24 25  GLUCOSE 114* 111*  BUN 26* 16  CREATININE 0.74 0.62  CALCIUM 8.9 8.8   Liver Function Tests:  Recent Labs Lab 12/09/12 1105 12/10/12 0330  AST 29 34  ALT 23 27  ALKPHOS 76 74  BILITOT 0.3 0.4  PROT 5.8* 5.5*  ALBUMIN 3.7 3.4*    Recent Labs Lab 12/09/12 1105  LIPASE 28   No results found for this basename: AMMONIA,  in the last 168 hours CBC:  Recent Labs Lab 12/09/12 1105 12/10/12 0330  WBC 7.4 9.1  NEUTROABS 5.7  --   HGB 12.4 12.9  HCT 36.8 37.4  MCV 89.3 88.8  PLT 177 169  Cardiac Enzymes:  Recent Labs Lab 12/09/12 1105  TROPONINI <0.30   BNP (last 3 results) No results found for this basename: PROBNP,  in the last 8760 hours CBG: No results found for this basename: GLUCAP,  in the last 168 hours  No results found for this or any previous visit (from the past 240 hour(s)).   Studies: Dg Chest 2 View  12/09/2012   CLINICAL DATA:  FAILURE TO THRIVE NAUSEA EMESIS,  COPD, smoker  EXAM: CHEST  2 VIEW  COMPARISON:  09/23/2011  FINDINGS: Lungs are hyperinflated. Sigmoid scoliosis noted. Normal cardiac silhouette. No effusion, infiltrate, or pneumothorax.  IMPRESSION: Hyperinflated lungs without acute findings.   Electronically Signed   By: Suzy Bouchard M.D.   On: 12/09/2012 10:36   Ct Head Wo Contrast  12/09/2012   CLINICAL DATA:  Nausea, vomiting, failure to thrive.  EXAM: CT HEAD WITHOUT CONTRAST  TECHNIQUE: Contiguous axial images were obtained from the base of the skull through the vertex without intravenous contrast.  COMPARISON:  MRI 03/08/2009  FINDINGS: There is atrophy and chronic small vessel disease changes. No acute intracranial abnormality. Specifically, no hemorrhage, hydrocephalus, mass lesion, acute infarction, or significant intracranial injury. No acute calvarial abnormality. Visualized paranasal sinuses and mastoids clear. Orbital soft tissues unremarkable.  IMPRESSION: No acute intracranial abnormality.  Atrophy, chronic microvascular disease.   Electronically Signed   By: Rolm Baptise M.D.   On: 12/09/2012 10:51   Ct Abdomen Pelvis W Contrast  12/09/2012   CLINICAL DATA:  Two days of nausea and vomiting and dysphagia. There is history of COPD and reflux disease.  EXAM: CT ABDOMEN AND PELVIS WITH CONTRAST  TECHNIQUE: Multidetector CT imaging of the abdomen and pelvis was performed using the standard protocol following bolus administration of intravenous contrast. The patient also received oral contrast material.  CONTRAST:  35mL OMNIPAQUE IOHEXOL 300 MG/ML SOLN and oral contrast material  COMPARISON:  None.  FINDINGS: The liver exhibits no focal mass. There is mild intrahepatic ductal dilation consistent with previous cholecystectomy. Surgical clips are present in the gallbladder fossa. The common bile duct and pancreas exhibit no acute abnormalities. The spleen is not enlarged. The stomach is moderately distended with gas and fluid. The duodenum is  not abnormally distended. The partially contrast filled loops of small bowel appear normal. There is some gas within proximal small bowel loops. There is fluid and gas within the colon. There does not appear to be colonic obstruction or acute inflammation.  There are no adrenal masses. The kidneys enhance well with no evidence of stones or obstruction. The caliber of the abdominal aorta is normal but its course is tortuous. There is no evidence of a psoas abscess. The urinary bladder is quite distended despite the presence of the Foley catheter. The rectosigmoid colon appears normal. There is no free fluid in the pelvis. The uterus is surgically absent. There are no adnexal masses. On delayed images contrast within the renal collecting systems is normal.  There is generalized wasting of the subcutaneous fat consistent with the patient's failure to thrive.  At lung window settings there is basilar atelectasis posteriorly on the left. The patient has known lumbar levoscoliosis with significant disc space narrowing at multiple levels.  IMPRESSION: 1. There is mild intrahepatic ductal dilation consistent with previous cholecystectomy. There is no hepatosplenomegaly nor evidence of masses within these organs. The pancreas is atrophic without evidence of suspicious masses.  2. There is no evidence of the small or large bowel obstruction. There  is a moderate amount of gas within the transverse and descending portions of the colon without evidence of obstruction.  3. There is no evidence of urinary tract obstruction. There is distention of the urinary bladder however. Correlation as to the functioning of the foley catheter is needed.   Electronically Signed   By: David  Martinique   On: 12/09/2012 14:09    Scheduled Meds: . clorazepate  7.5 mg Oral BID  . dorzolamide-timolol  1 drop Both Eyes BID  . enoxaparin (LOVENOX) injection  30 mg Subcutaneous Q24H  . famotidine  20 mg Oral BID  . fluticasone  1 puff Inhalation  BID  . hydrocortisone   Rectal BID  . Ipratropium-Albuterol  1 puff Inhalation TID  . lactose free nutrition  237 mL Oral TID WC  . mirtazapine  45 mg Oral QHS  . Travoprost (BAK Free)  1 drop Both Eyes QHS   Continuous Infusions: . dextrose 5 % and 0.45% NaCl 75 mL/hr at 12/09/12 1700    Principal Problem:   Intractable nausea and vomiting Active Problems:   Depression   Anxiety   COPD (chronic obstructive pulmonary disease)   GERD (gastroesophageal reflux disease)   Macular degeneration   Failure to thrive    Time spent: 35 minutes    Kelvin Cellar  [name] Pager [phone]. If 7PM-7AM, please contact night-coverage at [email or web address], password [facility] 12/10/2012, 1:51 PM  LOS: 1 day

## 2012-12-10 NOTE — Evaluation (Signed)
Clinical/Bedside Swallow Evaluation Patient Details  Name: KWAME CARRO MRN: [id number] Date of Birth: 01/08/1929  Today's Date: 12/10/2012 Time: [id number] SLP Time Calculation (min): 23 min  Past Medical History:  Past Medical History  Diagnosis Date  . Osteoporosis   . Depression   . Anxiety   . COPD (chronic obstructive pulmonary disease)   . GERD (gastroesophageal reflux disease)   . Abuse     benzos/narcotics  . Migraines   . Scoliosis   . Insomnia, unspecified   . Vitamin B deficiency   . Weight loss   . Chronic mental illness    Past Surgical History:  Past Surgical History  Procedure Laterality Date  . Cholecystectomy    . Partial hysterectomy    . Abdominal hysterectomy     HPI:  79 y.o. female has a past medical history significant for COPD, Depression, anxiety, GERD,chronic mental illness, prior substance abuse, presents to the ED with a chief complaint of weakness. She has been having severe nausea for the past 3 days and was unable to eat much (per daughter she only had 2 cups of yoghurt in 2 days) and was unable to drink enough water. She has no appetite due to her nausea. She denies any problems swallowing her food and denies pain with eating.  Daughter also endorses weight loss, she was 78 lbs in August, 77 in September and just couple of weeks ago she was down to 73. She has a history of tobacco abuse and is still smoking. Patient states that she "quit 3 days ago" however daughter states that this is due to the fact that she is too weak to get to her "smoking room" in her house.  CXR 10/28 revealed Hyperinflated lungs without acute findings.  Pt. and daughter report dentures are ill fitting and she cannot wear them ( x 1 month).  She diet includes soft biscuits and gravy, yogurt and Ensure. plus   Assessment / Plan / Recommendation Clinical Impression  Swallow assessment completed on this significantly underweight patient with daughter at bedside.  Overall  pharyngeal swallow ability appears functional except for an audible swallow that can sometimes represent structural abnormalities leading to incoordinated motor movements (pt. describes a recent cyst-like structure on right neck that pt. reported "has drained").  No s/s aspiration exhibited; chest xray negative for acute findings.  Pt. denies any pharyngeal or esophageal globus sensation, or expectoration of food.  History of GERD present, however pt. denies frequent burning or reflux sensation. Pt. has not worn dentures in one month due to ill fitting and pt. reports she used them prior to eat.  Daughter stated she will bring dentures to hospital to try fitting again now that "cyst-like" structure is decreased.  Discussed results with Dr. Coralyn Pear and SLP recommends Dys 1 diet, thin liquids, pills with water and a consult for dietitian to assess pt.  Brielfy educated pt. and daughter on process of pureeing food at home.  SLP will briefly follow for continued education regarding puree foods, reassess texture if dentures brought to hospital.  She may benefit from further evaluation of esophageal/GI function.     Aspiration Risk  Moderate    Diet Recommendation Dysphagia 1 (Puree);Thin liquid   Liquid Administration via: Cup;Straw Medication Administration: Whole meds with liquid Supervision: Patient able to self feed;Intermittent supervision to cue for compensatory strategies Compensations: Small sips/bites Postural Changes and/or Swallow Maneuvers: Seated upright 90 degrees;Upright 30-60 min after meal    Other  Recommendations Oral Care Recommendations:  Oral care BID   Follow Up Recommendations  Skilled Nursing facility    Frequency and Duration min 1 x/week  2 weeks   Pertinent Vitals/Pain No pain         Swallow Study Prior Functional Status  Type of Home: House Available Help at Discharge: Family;Available PRN/intermittently       Oral/Motor/Sensory Function Overall Oral  Motor/Sensory Function: Appears within functional limits for tasks assessed   Ice Chips Ice chips: Not tested   Thin Liquid Thin Liquid: Impaired Presentation: Cup;Straw Pharyngeal  Phase Impairments:  (audible swallow)    Nectar Thick Nectar Thick Liquid: Not tested   Honey Thick Honey Thick Liquid: Not tested   Puree Puree: Impaired Pharyngeal Phase Impairments:  (audible swallow)   Solid   GO Functional Assessment Tool Used: clinical judgement Functional Limitations: Swallowing Swallow Current Status [id number]): At least 1 percent but less than 20 percent impaired, limited or restricted Swallow Goal Status [id number]): 0 percent impaired, limited or restricted  Solid: Not tested (pt. unable)       Cranford Mon[employer] [phone]  12/10/2012

## 2012-12-10 NOTE — Progress Notes (Signed)
G-Codes for PT evaluation   12/10/12 1221  PT Time Calculation  PT Start Time 1152  PT Stop Time 1206  PT Time Calculation (min) 14 min  PT G-Codes **NOT FOR INPATIENT CLASS**  Functional Assessment Tool Used clinical judgement  Functional Limitation Mobility: Walking and moving around  Mobility: Walking and Moving Around Current Status [id number]) CJ  Mobility: Walking and Moving Around Goal Status [id number]) CI  PT General Charges  $$ ACUTE PT VISIT 1 Procedure  PT Evaluation  $Initial PT Evaluation Tier I 1 Procedure  PT Treatments  $Therapeutic Activity 8-22 mins   Carmelia Bake, PT, DPT 12/10/2012 Pager: [phone]

## 2012-12-10 NOTE — Telephone Encounter (Signed)
Patient has not gotten any further meds from the pharmacy, which were prescribed by you. Pharmacist states he did give her a refill on her hemorrhoid cream, but this is all he has dispensed.

## 2012-12-10 NOTE — Progress Notes (Signed)
INITIAL NUTRITION ASSESSMENT  Pt meets criteria for severe MALNUTRITION in the context of chronic illness as evidenced by <75% estimated energy intake with 6.4% weight loss in the past 2 months in addition to pt with severe muscle wasting and subcutaneous fat loss in clavicles, upper arms, and hands.  DOCUMENTATION CODES Per approved criteria  -Severe malnutrition in the context of chronic illness -Underweight   INTERVENTION: - D/C Boost Plus as pt only prefers Ensure Plus from home - Encouraged increased meal intake, offered to order pt snacks between meals however pt declined - Recommend MD consider appetite stimulant  - Will continue to monitor   NUTRITION DIAGNOSIS: Increased nutrient needs related to underweight as evidenced by BMI of 14.74 kg/(m^2).Marland Kitchen   Goal: Pt to consume >90% of meals/supplements  Monitor:  Weights, labs, intake  Reason for Assessment: Nutrition risk, consult  79 y.o. female  Admitting Dx: Intractable nausea and vomiting  ASSESSMENT: Admitted with failure to thrive, nausea, and vomiting x 2 days. Hx of GERD, COPD, depression, anxiety, and prior substance abuse. Per daughter, she has had only 2 cups of yogurt in the past 2 days. Pt's weight down 5 pounds in the past 2 months. Pt and daughter report pt's dentures don't fit well and she hasn't been able to wear them in the past month. Her diet at home consists of soft biscuits and gravy, yogurt, and Ensure Plus. Had bedside swallow evaluation today and SLP noted pt at moderate aspiration risk and recommended dysphagia 1, thin liquid diet.   Met with daughter and pt who report pt has been eating only 1 meal/day for at least the past 2 months. Pt drinks Ensure Plus at home. Denies any vomiting today but c/o some nausea. Only ate a few bites of puree diet. Does not like Ensure Complete, states it tastes like water, daughter brought in Ensure Plus from home that pt prefers. Daughter states family members have been  trying to get pt to eat more and come over and cook for her, but pt still only wants to eat 1 meal/day. Pt with severe muscle wasting and subcutaneous fat loss in orbital/temporal region, clavicles, upper arms, and hands.   Potassium low, getting oral and IV replacement.   Height: Ht Readings from Last 1 Encounters:  12/09/12 4\' 11"  (1.499 m)    Weight: Wt Readings from Last 1 Encounters:  12/09/12 73 lb (33.113 kg)    Ideal Body Weight: 98 lb   % Ideal Body Weight: 74%  Wt Readings from Last 10 Encounters:  12/09/12 73 lb (33.113 kg)  09/16/12 78 lb 14.4 oz (35.789 kg)  08/05/12 78 lb 12.8 oz (35.743 kg)  06/03/12 81 lb 6.4 oz (36.923 kg)  05/08/12 83 lb (37.649 kg)  04/15/12 86 lb 3.2 oz (39.1 kg)  03/23/12 84 lb 9.6 oz (38.374 kg)  12/25/11 86 lb (39.009 kg)  10/16/11 84 lb 12.8 oz (38.465 kg)  09/23/11 86 lb 3.2 oz (39.1 kg)    Usual Body Weight: 78 lb 2 months ago  % Usual Body Weight: 93%  BMI:  Body mass index is 14.74 kg/(m^2). Underweight  Estimated Nutritional Needs: Kcal: 1150-1350 Protein: 50-60g Fluid: 1.1-1.3L/day  Skin: Intact   Diet Order: Dysphagia 1, thin  EDUCATION NEEDS: -No education needs identified at this time   Intake/Output Summary (Last 24 hours) at 12/10/12 1324 Last data filed at 12/10/12 0010  Gross per 24 hour  Intake      0 ml  Output  950 ml  Net   -950 ml    Last BM: PTA  Labs:   Recent Labs Lab 12/09/12 1105 12/10/12 0330  NA 144 139  K 3.3* 2.7*  CL 110 104  CO2 24 25  BUN 26* 16  CREATININE 0.74 0.62  CALCIUM 8.9 8.8  GLUCOSE 114* 111*    CBG (last 3)  No results found for this basename: GLUCAP,  in the last 72 hours  Scheduled Meds: . clorazepate  7.5 mg Oral BID  . dorzolamide-timolol  1 drop Both Eyes BID  . enoxaparin (LOVENOX) injection  30 mg Subcutaneous Q24H  . famotidine  20 mg Oral BID  . fluticasone  1 puff Inhalation BID  . hydrocortisone   Rectal BID  . Ipratropium-Albuterol  1  puff Inhalation TID  . lactose free nutrition  237 mL Oral TID WC  . mirtazapine  45 mg Oral QHS  . Travoprost (BAK Free)  1 drop Both Eyes QHS    Continuous Infusions: . dextrose 5 % and 0.45% NaCl 75 mL/hr at 12/09/12 1700    Past Medical History  Diagnosis Date  . Osteoporosis   . Depression   . Anxiety   . COPD (chronic obstructive pulmonary disease)   . GERD (gastroesophageal reflux disease)   . Abuse     benzos/narcotics  . Migraines   . Scoliosis   . Insomnia, unspecified   . Vitamin B deficiency   . Weight loss   . Chronic mental illness     Past Surgical History  Procedure Laterality Date  . Cholecystectomy    . Partial hysterectomy    . Abdominal hysterectomy      Mikey College MS, [place], [place] Pager [phone] After Hours Pager

## 2012-12-10 NOTE — Progress Notes (Signed)
12/10/12 1430  Patient refuse to drink ensure complete and Boost Plus due to flavoring. Patient daughter brought Vanilla flavored Ensure Plus from home.  Spoke with Dr Coralyn Pear okay to use home Ensure Plus Vanilla.  Patient has six Ensure Plus in the refrigerator at nurses station .

## 2012-12-10 NOTE — Evaluation (Signed)
Physical Therapy Evaluation Patient Details Name: Taylor Roth MRN: [id number] DOB: 10-24-1928 Today's Date: 12/10/2012 Time: 1152-1206 PT Time Calculation (min): 14 min  PT Assessment / Plan / Recommendation History of Present Illness  Pt is a 79 y.o. female has a past medical history significant for COPD, Depression, anxiety, prior substance abuse, presents to the ED with a chief complaint of weakness. She has been having severe nausea for the past 3 days and was unable to eat much (per daughter she only had 2 cups of yoghurt in 2 days) and was unable to drink enough water. She has no appetite due to her nausea. She denies any problems swallowing her food and denies pain with eating. She has no chest pain or abdominal pain. She has no diarrhea. Denies fever or chills. She denies dysuria. Today she felt too weak to even walk and was unable to answer the door to her house. Daughter also endorses weight loss, she was 78 lbs in August, 77 in September and just couple of weeks ago she was down to 73. She has a history of tobacco abuse and is still smoking. Patient states that she "quit 3 days ago" however daughter states that this is due to the fact that she is too weak to get to her "smoking room" in her house. In the ED labs remarkable for mild dehydration.  Clinical Impression  Pt admitted with the above. Pt currently presenting with functional limitations due to deficits listed below (see PT Problem List). Pt would benefit from skilled PT in order to increase independence and safety during mobility and to allow d/c to venue listed below. Pt and pt's daughter reports pt has an aide that comes to the house 6x/week for 2 hours to assist in cleaning and cooking but pt lives alone.  Pt's participation limited by lightheadedness today, pt able to transfer from bed to chair with +2 assist for safety.  PT recommends ST-SNF to improve strength, endurance and safety.  PT Assessment  Patient needs continued  PT services    Follow Up Recommendations  Supervision for mobility/OOB;SNF    Does the patient have the potential to tolerate intense rehabilitation      Barriers to Discharge Decreased caregiver support      Equipment Recommendations  None recommended by PT    Recommendations for Other Services     Frequency Min 3X/week    Precautions / Restrictions Precautions Precautions: Fall Restrictions Weight Bearing Restrictions: No   Pertinent Vitals/Pain No reports of pain during session. BP in sitting: 149/73 with c/o lightheadedness which remained the same during session, regardless of position. Pt positioned to comfort in chair at end of session.      Mobility  Bed Mobility Bed Mobility: Supine to Sit;Sitting - Scoot to Edge of Bed Supine to Sit: 4: Min assist Sitting - Scoot to [employer] of Bed: 4: Min guard Details for Bed Mobility Assistance: assist to guide trunk into sitting position and to scoot towards EOB. Pt reports feeling lightheaded upon sitting and had to lie back in supine, pt able to sit EOB during second attempt, BP assessed: 149/73 in sitting.  Transfers Transfers: Sit to Stand;Stand to Sit;Stand Pivot Transfers Sit to Stand: 1: +2 Total assist;From bed;With upper extremity assist (+2 for safety, pt required mod A to stand.) Sit to Stand: Patient Percentage: 70% Stand to Sit: 4: Min assist;To chair/3-in-1;With armrests Stand Pivot Transfers: 4: Min assist Details for Transfer Assistance: assist provided due to weakness and pt reporting  feeling lightheaded in sitting position. Ambulation/Gait Ambulation/Gait Assistance: Not tested (comment) Ambulation/Gait Assistance Details: due to weakness and lightheadedness    Exercises     PT Diagnosis: Difficulty walking;Generalized weakness  PT Problem List: Decreased strength;Decreased activity tolerance;Decreased balance;Decreased mobility;Decreased knowledge of use of DME PT Treatment Interventions: DME instruction;Gait  training;Stair training;Functional mobility training;Therapeutic activities;Therapeutic exercise;Balance training;Neuromuscular re-education;Patient/family education     PT Goals(Current goals can be found in the care plan section) Acute Rehab PT Goals Patient Stated Goal: none specified PT Goal Formulation: With patient Time For Goal Achievement: 12/24/12 Potential to Achieve Goals: Good  Visit Information  Last PT Received On: 12/10/12 Assistance Needed: +1 History of Present Illness: Pt is a 79 y.o. female has a past medical history significant for COPD, Depression, anxiety, prior substance abuse, presents to the ED with a chief complaint of weakness. She has been having severe nausea for the past 3 days and was unable to eat much (per daughter she only had 2 cups of yoghurt in 2 days) and was unable to drink enough water. She has no appetite due to her nausea. She denies any problems swallowing her food and denies pain with eating. She has no chest pain or abdominal pain. She has no diarrhea. Denies fever or chills. She denies dysuria. Today she felt too weak to even walk and was unable to answer the door to her house. Daughter also endorses weight loss, she was 78 lbs in August, 77 in September and just couple of weeks ago she was down to 73. She has a history of tobacco abuse and is still smoking. Patient states that she "quit 3 days ago" however daughter states that this is due to the fact that she is too weak to get to her "smoking room" in her house. In the ED labs remarkable for mild dehydration.       Prior [place] expects to be discharged to:: Private residence Living Arrangements: Alone Available Help at Discharge: Family;Available PRN/intermittently Type of Home: House Home Access: Stairs to enter [employer] of Steps: 2 Entrance Stairs-Rails: None Home Layout: One level Home Equipment: Walker - 2 wheels;Cane - single point Prior  Function Level of Independence: Independent Comments: pt reports she did not use AD to ambulate prior to hospitalization Communication Communication: Other (comment) (slightly difficuly to understand due to pt did not have dentures in mouth during session.)    Cognition  Cognition Arousal/Alertness: Awake/alert Behavior During Therapy: WFL for tasks assessed/performed Overall Cognitive Status: Within Functional Limits for tasks assessed    Extremity/Trunk Assessment Lower Extremity Assessment Lower Extremity Assessment: Generalized weakness   Balance Balance Balance Assessed: Yes Static Sitting Balance Static Sitting - Balance Support: Bilateral upper extremity supported;Feet supported Static Sitting - Level of Assistance: 5: Stand by assistance Static Sitting - Comment/# of Minutes: pt able to sit EOB with min guard-supervision during bil LE MMT and to assess BP prior to standing for approx. 5 minutes.  End of Session PT - End of Session Activity Tolerance: Patient limited by fatigue Patient left: in chair;with family/visitor present;with call bell/phone within reach  GP     Audie Clear 12/10/2012, 1:24 PM

## 2012-12-10 NOTE — Evaluation (Signed)
I have reviewed this note and agree with all findings. Kati Eris Breck, PT, DPT Pager: [phone]   

## 2012-12-10 NOTE — Progress Notes (Signed)
CRITICAL VALUE ALERT  Critical value received:  K  2.7  Date of notification:  10/29  Time of notification:  0515  Critical value read back:YES  Nurse who received alert:Emmelyn Schmale ,Amorina Doerr  MD notified (1st page):L. HARDUK    Time of first page:  0515  MD notified (2nd page):  Time of second page:  Responding MD:L. Lancaster  Time MD responded:  [id number]

## 2012-12-11 ENCOUNTER — Encounter (HOSPITAL_COMMUNITY): Payer: Self-pay | Admitting: Internal Medicine

## 2012-12-11 DIAGNOSIS — N184 Chronic kidney disease, stage 4 (severe): Secondary | ICD-10-CM

## 2012-12-11 DIAGNOSIS — E876 Hypokalemia: Secondary | ICD-10-CM | POA: Diagnosis present

## 2012-12-11 DIAGNOSIS — I771 Stricture of artery: Secondary | ICD-10-CM

## 2012-12-11 DIAGNOSIS — I951 Orthostatic hypotension: Secondary | ICD-10-CM | POA: Diagnosis present

## 2012-12-11 HISTORY — DX: Chronic kidney disease, stage 4 (severe): N18.4

## 2012-12-11 HISTORY — DX: Stricture of artery: I77.1

## 2012-12-11 LAB — CBC
Hemoglobin: 12.4 g/dL (ref 12.0–15.0)
MCHC: 33.6 g/dL (ref 30.0–36.0)
Platelets: 173 10*3/uL (ref 150–400)
RDW: 13.2 % (ref 11.5–15.5)
WBC: 7.7 10*3/uL (ref 4.0–10.5)

## 2012-12-11 LAB — BASIC METABOLIC PANEL
GFR calc Af Amer: >90 mL/min (ref >90)
GFR calc non Af Amer: 79 mL/min — ABNORMAL LOW (ref >90)
Potassium: 4.3 mEq/L (ref 3.5–5.1)
Sodium: 142 mEq/L (ref 135–145)

## 2012-12-11 MED ORDER — TRAMADOL HCL 50 MG PO TABS: 50.0000 mg | ORAL_TABLET | Freq: Four times a day (QID) | ORAL | Status: DC | PRN

## 2012-12-11 MED ORDER — FAMOTIDINE 20 MG PO TABS
20.0000 mg | ORAL_TABLET | Freq: Every day | ORAL | Status: DC
  Administered 2012-12-12 – 2012-12-14 (×3): 20 mg via ORAL
  Filled 2012-12-11 (×3): qty 1

## 2012-12-11 MED ORDER — ASPIRIN 81 MG PO CHEW
81.0000 mg | CHEWABLE_TABLET | Freq: Every day | ORAL | Status: DC
  Administered 2012-12-11 – 2012-12-14 (×4): 81 mg via ORAL
  Filled 2012-12-11 (×4): qty 1

## 2012-12-11 NOTE — Progress Notes (Signed)
Clinical Social Work Department CLINICAL SOCIAL WORK PLACEMENT NOTE 12/11/2012  Patient:  Taylor Roth, Taylor Roth  Account Number:  [id number] [place] date:  12/09/2012  Clinical Social Worker:  Renold Genta  Date/time:  12/11/2012 03:27 PM  Clinical Social Work is seeking post-discharge placement for this patient at the following level of care:   SKILLED NURSING   (*CSW will update this form in Epic as items are completed)   12/11/2012  Patient/family provided with [facility] Department of Clinical Social Work's list of facilities offering this level of care within the geographic area requested by the patient (or if unable, by the patient's family).  12/11/2012  Patient/family informed of their freedom to choose among providers that offer the needed level of care, that participate in Medicare, Medicaid or managed care program needed by the patient, have an available bed and are willing to accept the patient.  12/11/2012  Patient/family informed of [facility]' ownership interest in [facility], as well as of the fact that they are under no obligation to receive care at this facility.  PASARR submitted to EDS on 12/11/2012 PASARR number received from EDS on 12/11/2012  FL2 transmitted to all facilities in geographic area requested by pt/family on  12/11/2012 FL2 transmitted to all facilities within larger geographic area on   Patient informed that his/her managed care company has contracts with or will negotiate with  certain facilities, including the following:     Patient/family informed of bed offers received:   Patient chooses bed at  Physician recommends and patient chooses bed at    Patient to be transferred to  on   Patient to be transferred to facility by   The following physician request were entered in Epic:   Additional Comments:   Winfred Leeds, [place] Worker cell #: [phone]

## 2012-12-11 NOTE — Progress Notes (Signed)
Patient's 1800 meds changed to 2000 per patient's request. Larkin Ina in pharmacy aware about the changes.Sandie Ano RN

## 2012-12-11 NOTE — Care Management Note (Signed)
CM spoke with patient at the bedside concerning dc planning. PT recommends supervision with mobility vs SNf. Per pt feels unable to dc home at discharge. Pt provided CM permission to contact CSW concerning SNF placement.   Venita Lick Tod Abrahamsen,RN,MSN [phone]

## 2012-12-11 NOTE — Progress Notes (Signed)
Clinical Social Work Department BRIEF PSYCHOSOCIAL ASSESSMENT 12/11/2012  Patient:  Taylor Roth, Taylor Roth     Account Number:  [id number]     [place] date:  12/09/2012  Clinical Social Worker:  Renold Genta  Date/Time:  12/11/2012 03:22 PM  Referred by:  Physician  Date Referred:  12/11/2012 Referred for  SNF Placement   Other Referral:   Interview type:  Patient Other interview type:   and daughter, [name] at bedside    PSYCHOSOCIAL DATA Living Status:  ALONE Admitted from facility:   Level of care:   Primary support name:  [name] (daughter) h#: [phone] c#: [id number] Primary support relationship to patient:  CHILD, ADULT Degree of support available:   good    CURRENT CONCERNS Current Concerns  Post-Acute Placement   Other Concerns:    SOCIAL WORK ASSESSMENT / PLAN CSW received consult that patient needs SNF at discharge - per PT recommendation.   Assessment/plan status:  Information/Referral to [employer] Other assessment/ plan:   Information/referral to community resources:   CSW completed FL2 and faxed information out to [facility] - provided list of facilities and will follow-up with bed offers when available.    PATIENT'S/FAMILY'S RESPONSE TO PLAN OF CARE: Patient and daughter are agreeable with plan for SNF at discharge. Daughter is requesting [facility] awaiting call back re: bed availability. Anticipating discharge tomorrow.       Winfred Leeds, [facility] Clinical Social Worker cell #: [phone]

## 2012-12-11 NOTE — Progress Notes (Signed)
[name] PROGRESS NOTE  Taylor Roth [id number] DOB: May 08, 1928 DOA: 12/09/2012 PCP: Jenny Reichmann, MD  Brief narrative: Taylor Roth is an 79 y.o. female with a PMH of COPD, major depression, anxiety, prior substance abuse, tobacco abuse who was admitted on 12/09/2012 with intractable nausea and vomiting with weakness and failure to thrive.  Assessment/Plan: Principal Problem:   Probable gastroenteritis intractable nausea and vomiting / dehydration / orthostatic hypotension -Elevated BUN-creatinine ratio noted on admission, improved with IV fluids. -Continue supportive care including IV fluids and anti-emetics. -Lipase not elevated. No elevation of LFTs. No complaints of diarrhea. Active Problems:   Arterial tortuosity of aorta / cerebrovascular disease -Aortic tortuosity noted on admission CT. Head CT reveals small vessel disease. -Troponin negative on admission. -Start aspirin therapy.   Stage IV CKD -GFR 28.3.  Discontinue ibuprofen and avoid nephrotoxins.   Hypokalemia -Likely from GI losses. Supplemented.   History of major depression/anxiety -Continue Remeron and Tranxene.   COPD (chronic obstructive pulmonary disease) -Continue Combivent.   GERD (gastroesophageal reflux disease) -Continue Pepcid.   Macular degeneration -Continue Travatan and Acular.   Failure to thrive -Physical therapy evaluation.   Protein-calorie malnutrition, severe, underweight -Continue Ensure supplements.   Code Status: DNR Family Communication: No family at bedside. [name] [phone]) updated by telephone (daugther). Disposition Plan: SNF.   Medical Consultants:  None.  Other Consultants:  Physical therapy.  Anti-infectives:  None.  HPI/Subjective: Taylor Roth is weak and has vague complaints but nothing specific. Says she "couldn't walk" before she came in. No active nausea, vomiting, or diarrhea. Appetite is poor.  Objective: Filed Vitals:    12/11/12 0605 12/11/12 0725 12/11/12 0728 12/11/12 0730  BP: 128/69 155/59 151/68 136/75  Pulse: 64 59 64 72  Temp: 97.8 F (36.6 C)     TempSrc: Oral     Resp: 20     Height:      Weight:      SpO2: 98%       Intake/Output Summary (Last 24 hours) at 12/11/12 0810 Last data filed at 12/11/12 0700  Gross per 24 hour  Intake   1025 ml  Output    750 ml  Net    275 ml    Exam: Gen:  Frail, cachectic-appearing Cardiovascular:  RRR, No M/R/G Respiratory:  Lungs diminished Gastrointestinal:  Abdomen soft, NT/ND, + BS Extremities:  No C/E/C  Data Reviewed: Basic Metabolic Panel:  Recent Labs Lab 12/09/12 1105 12/10/12 0330 12/11/12 0349  NA 144 139 142  K 3.3* 2.7* 4.3  CL 110 104 111  CO2 24 25 25   GLUCOSE 114* 111* 104*  BUN 26* 16 11  CREATININE 0.74 0.62 0.68  CALCIUM 8.9 8.8 9.0   GFR Estimated Creatinine Clearance: 28.3 ml/min (by C-G formula based on Cr of 0.68). Liver Function Tests:  Recent Labs Lab 12/09/12 1105 12/10/12 0330  AST 29 34  ALT 23 27  ALKPHOS 76 74  BILITOT 0.3 0.4  PROT 5.8* 5.5*  ALBUMIN 3.7 3.4*    Recent Labs Lab 12/09/12 1105  LIPASE 28   CBC:  Recent Labs Lab 12/09/12 1105 12/10/12 0330 12/11/12 0349  WBC 7.4 9.1 7.7  NEUTROABS 5.7  --   --   HGB 12.4 12.9 12.4  HCT 36.8 37.4 36.9  MCV 89.3 88.8 88.5  PLT 177 169 173   Cardiac Enzymes:  Recent Labs Lab 12/09/12 1105  TROPONINI <0.30    Procedures and Diagnostic Studies: Dg Chest 2 View  12/09/2012   CLINICAL DATA:  FAILURE TO THRIVE NAUSEA EMESIS, COPD, smoker  EXAM: CHEST  2 VIEW  COMPARISON:  09/23/2011  FINDINGS: Lungs are hyperinflated. Sigmoid scoliosis noted. Normal cardiac silhouette. No effusion, infiltrate, or pneumothorax.  IMPRESSION: Hyperinflated lungs without acute findings.   Electronically Signed   By: Suzy Bouchard M.D.   On: 12/09/2012 10:36   Ct Head Wo Contrast  12/09/2012   CLINICAL DATA:  Nausea, vomiting, failure to thrive.   EXAM: CT HEAD WITHOUT CONTRAST  TECHNIQUE: Contiguous axial images were obtained from the base of the skull through the vertex without intravenous contrast.  COMPARISON:  MRI 03/08/2009  FINDINGS: There is atrophy and chronic small vessel disease changes. No acute intracranial abnormality. Specifically, no hemorrhage, hydrocephalus, mass lesion, acute infarction, or significant intracranial injury. No acute calvarial abnormality. Visualized paranasal sinuses and mastoids clear. Orbital soft tissues unremarkable.  IMPRESSION: No acute intracranial abnormality.  Atrophy, chronic microvascular disease.   Electronically Signed   By: Rolm Baptise M.D.   On: 12/09/2012 10:51   Ct Abdomen Pelvis W Contrast  12/09/2012   CLINICAL DATA:  Two days of nausea and vomiting and dysphagia. There is history of COPD and reflux disease.  EXAM: CT ABDOMEN AND PELVIS WITH CONTRAST  TECHNIQUE: Multidetector CT imaging of the abdomen and pelvis was performed using the standard protocol following bolus administration of intravenous contrast. The patient also received oral contrast material.  CONTRAST:  40mL OMNIPAQUE IOHEXOL 300 MG/ML SOLN and oral contrast material  COMPARISON:  None.  FINDINGS: The liver exhibits no focal mass. There is mild intrahepatic ductal dilation consistent with previous cholecystectomy. Surgical clips are present in the gallbladder fossa. The common bile duct and pancreas exhibit no acute abnormalities. The spleen is not enlarged. The stomach is moderately distended with gas and fluid. The duodenum is not abnormally distended. The partially contrast filled loops of small bowel appear normal. There is some gas within proximal small bowel loops. There is fluid and gas within the colon. There does not appear to be colonic obstruction or acute inflammation.  There are no adrenal masses. The kidneys enhance well with no evidence of stones or obstruction. The caliber of the abdominal aorta is normal but its course  is tortuous. There is no evidence of a psoas abscess. The urinary bladder is quite distended despite the presence of the Foley catheter. The rectosigmoid colon appears normal. There is no free fluid in the pelvis. The uterus is surgically absent. There are no adnexal masses. On delayed images contrast within the renal collecting systems is normal.  There is generalized wasting of the subcutaneous fat consistent with the patient's failure to thrive.  At lung window settings there is basilar atelectasis posteriorly on the left. The patient has known lumbar levoscoliosis with significant disc space narrowing at multiple levels.  IMPRESSION: 1. There is mild intrahepatic ductal dilation consistent with previous cholecystectomy. There is no hepatosplenomegaly nor evidence of masses within these organs. The pancreas is atrophic without evidence of suspicious masses.  2. There is no evidence of the small or large bowel obstruction. There is a moderate amount of gas within the transverse and descending portions of the colon without evidence of obstruction.  3. There is no evidence of urinary tract obstruction. There is distention of the urinary bladder however. Correlation as to the functioning of the foley catheter is needed.   Electronically Signed   By: David  Martinique   On: 12/09/2012 14:09    Scheduled  Meds: . clorazepate  7.5 mg Oral BID  . dorzolamide-timolol  1 drop Both Eyes BID  . enoxaparin (LOVENOX) injection  30 mg Subcutaneous Q24H  . famotidine  20 mg Oral BID  . fluticasone  1 puff Inhalation BID  . hydrocortisone   Rectal BID  . Ipratropium-Albuterol  1 puff Inhalation TID  . mirtazapine  45 mg Oral QHS  . Travoprost (BAK Free)  1 drop Both Eyes QHS   Continuous Infusions: . 0.9 % NaCl with KCl 20 mEq / L 75 mL/hr at 12/11/12 0655  . dextrose 5 % and 0.45% NaCl 75 mL/hr at 12/10/12 0700    Time spent: 25 minutes.   LOS: 2 days   RAMA,CHRISTINA  [name] Pager [phone].    *Please note that the hospitalists switch teams on Wednesdays. Please call the flow manager at [phone] if you are having difficulty reaching the hospitalist taking care of this patient as she can update you and provide the most up-to-date pager number of provider caring for the patient. If 8PM-8AM, please contact night-coverage at [email or web address], password [facility]  12/11/2012, 8:10 AM

## 2012-12-12 DIAGNOSIS — K5900 Constipation, unspecified: Secondary | ICD-10-CM | POA: Diagnosis not present

## 2012-12-12 MED ORDER — HYDROCODONE-ACETAMINOPHEN 5-325 MG PO TABS
1.0000 | ORAL_TABLET | ORAL | Status: DC | PRN
  Administered 2012-12-12 – 2012-12-13 (×2): 1 via ORAL
  Administered 2012-12-13: 2 via ORAL
  Administered 2012-12-13: 1 via ORAL
  Administered 2012-12-14 (×3): 2 via ORAL
  Filled 2012-12-12: qty 2
  Filled 2012-12-12: qty 1
  Filled 2012-12-12 (×3): qty 2
  Filled 2012-12-12 (×3): qty 1

## 2012-12-12 MED ORDER — BISACODYL 5 MG PO TBEC
5.0000 mg | DELAYED_RELEASE_TABLET | Freq: Every day | ORAL | Status: DC
  Administered 2012-12-13: 5 mg via ORAL
  Filled 2012-12-12 (×2): qty 1

## 2012-12-12 MED ORDER — ASPIRIN 81 MG PO CHEW: 81.0000 mg | CHEWABLE_TABLET | Freq: Every day | ORAL | Status: DC

## 2012-12-12 MED ORDER — TRAMADOL HCL 50 MG PO TABS: 50.0000 mg | ORAL_TABLET | Freq: Four times a day (QID) | ORAL | Status: DC | PRN

## 2012-12-12 MED ORDER — BISACODYL 5 MG PO TBEC: 5.0000 mg | DELAYED_RELEASE_TABLET | Freq: Every day | ORAL | Status: DC

## 2012-12-12 MED ORDER — HYDROCODONE-ACETAMINOPHEN 5-325 MG PO TABS: 1.0000 | ORAL_TABLET | ORAL | Status: DC | PRN

## 2012-12-12 MED ORDER — FLEET ENEMA 7-19 GM/118ML RE ENEM
1.0000 | ENEMA | Freq: Every day | RECTAL | Status: DC | PRN
  Administered 2012-12-12: 15:00:00 via RECTAL
  Administered 2012-12-13: 1 via RECTAL
  Filled 2012-12-12 (×3): qty 1

## 2012-12-12 MED ORDER — ONDANSETRON HCL 4 MG PO TABS: 4.0000 mg | ORAL_TABLET | Freq: Four times a day (QID) | ORAL | Status: DC | PRN

## 2012-12-12 MED ORDER — TRIAZOLAM 0.25 MG PO TABS: ORAL_TABLET | ORAL | Status: DC

## 2012-12-12 NOTE — Progress Notes (Signed)
I have reviewed this note and agree with all findings. Kati Sydnie Sigmund, PT, DPT Pager: [phone]   

## 2012-12-12 NOTE — Progress Notes (Signed)
Speech Language Pathology Treatment: Dysphagia  Patient Details Name: Taylor Roth MRN: [id number] DOB: 1929-01-08 Today's Date: 12/12/2012 Time: 1330-1340 SLP Time Calculation (min): 10 min  Assessment / Plan / Recommendation Clinical Impression  Pt tolerating upgraded diet well -makes adaptations to accommodate ill-fitting dentures, but is not demonstrating s/s of compromised airway protection.  She reports being hungry today, but then declines offers of food beyond limited amounts.  No SLP f/u is warranted - will sign-off.    HPI HPI: 79 y.o. female has a past medical history significant for COPD, Depression, anxiety, GERD,chronic mental illness, prior substance abuse, presents to the ED with a chief complaint of weakness. She has been having severe nausea for the past 3 days and was unable to eat much (per daughter she only had 2 cups of yoghurt in 2 days) and was unable to drink enough water. She has no appetite due to her nausea. She denies any problems swallowing her food and denies pain with eating.  Daughter also endorses weight loss, she was 78 lbs in August, 77 in September and just couple of weeks ago she was down to 73. She has a history of tobacco abuse and is still smoking. Patient states that she "quit 3 days ago" however daughter states that this is due to the fact that she is too weak to get to her "smoking room" in her house.  CXR 10/28 revealed Hyperinflated lungs without acute findings.  Pt. and daughter report dentures are ill fitting and she cannot wear them ( x 1 month).  She diet includes soft biscuits and gravy, yogurt and Ensure. plus      SLP Plan  All goals met    Recommendations Diet recommendations: Regular;Thin liquid Liquids provided via: Cup Medication Administration: Whole meds with liquid Supervision: Patient able to self feed;Intermittent supervision to cue for compensatory strategies Compensations: Small sips/bites              Oral Care  Recommendations: Oral care BID Follow up Recommendations: Skilled Nursing facility Plan: All goals met    GO     Taylor Roth 12/12/2012, 1:40 PM

## 2012-12-12 NOTE — Progress Notes (Addendum)
[name] PROGRESS NOTE  Taylor Roth [id number] DOB: 1929/01/08 DOA: 12/09/2012 PCP: Jenny Reichmann, MD  Brief narrative: Taylor Roth is an 79 y.o. female with a PMH of COPD, major depression, anxiety, prior substance abuse, tobacco abuse who was admitted on 12/09/2012 with intractable nausea and vomiting with weakness and failure to thrive.  Assessment/Plan: Principal Problem:   Probable gastroenteritis intractable nausea and vomiting / dehydration / orthostatic hypotension -Elevated BUN-creatinine ratio noted on admission, improved with IV fluids. -Continue supportive care including IV fluids and anti-emetics. -Lipase not elevated. No elevation of LFTs.  Active Problems:   Constipation -Will give Fleet enema today and start on Dulcolax Q HS.   Arterial tortuosity of aorta / cerebrovascular disease -Aortic tortuosity noted on admission CT. Head CT reveals small vessel disease. -Troponin negative on admission. -Continue aspirin therapy.   Stage IV CKD -GFR 28.3.  Avoid NSAIDs and nephrotoxins.   Hypokalemia -Likely from GI losses. Supplemented.   History of major depression/anxiety -Continue Remeron and Tranxene.   COPD (chronic obstructive pulmonary disease) -Has been refusing Combivent and Flovent. Continue Xopenex as needed.   GERD (gastroesophageal reflux disease) -Continue Pepcid.   Macular degeneration -Continue Travatan and Acular.   Failure to thrive -Physical therapy following.   Protein-calorie malnutrition, severe, underweight -Seen by dietitian 12/10/2012. Continue Ensure supplements.   Code Status: DNR Family Communication: No family at bedside. [name] [phone]) updated by telephone (daugther). Disposition Plan: SNF.   Medical Consultants:  None.  Other Consultants:  Physical therapy.  Anti-infectives:  None.  HPI/Subjective: Taylor Roth complains of abdominal pain, left lower quadrant today. Says her belly feels  hard. Complains of some constipation and tells me that at times, she has to use a Fleet enema at home to evacuate her bowels. No nausea or vomiting. Appetite improving.  Objective: Filed Vitals:   12/12/12 0510 12/12/12 0835 12/12/12 0837 12/12/12 0839  BP: 139/75 143/73 146/74 149/82  Pulse: 72 55 57 59  Temp: 97.6 F (36.4 C)     TempSrc: Oral     Resp: 20     Height:      Weight:      SpO2: 100% 100% 100% 100%    Intake/Output Summary (Last 24 hours) at 12/12/12 1017 Last data filed at 12/12/12 0900  Gross per 24 hour  Intake   1080 ml  Output    700 ml  Net    380 ml    Exam: Gen:  Frail, cachectic-appearing Cardiovascular:  RRR, No M/R/G Respiratory:  Lungs diminished Gastrointestinal:  Abdomen soft, tender left lower quadrant, + BS Extremities:  No C/E/C  Data Reviewed: Basic Metabolic Panel:  Recent Labs Lab 12/09/12 1105 12/10/12 0330 12/11/12 0349  NA 144 139 142  K 3.3* 2.7* 4.3  CL 110 104 111  CO2 24 25 25   GLUCOSE 114* 111* 104*  BUN 26* 16 11  CREATININE 0.74 0.62 0.68  CALCIUM 8.9 8.8 9.0   GFR Estimated Creatinine Clearance: 28.3 ml/min (by C-G formula based on Cr of 0.68). Liver Function Tests:  Recent Labs Lab 12/09/12 1105 12/10/12 0330  AST 29 34  ALT 23 27  ALKPHOS 76 74  BILITOT 0.3 0.4  PROT 5.8* 5.5*  ALBUMIN 3.7 3.4*    Recent Labs Lab 12/09/12 1105  LIPASE 28   CBC:  Recent Labs Lab 12/09/12 1105 12/10/12 0330 12/11/12 0349  WBC 7.4 9.1 7.7  NEUTROABS 5.7  --   --   HGB 12.4  12.9 12.4  HCT 36.8 37.4 36.9  MCV 89.3 88.8 88.5  PLT 177 169 173   Cardiac Enzymes:  Recent Labs Lab 12/09/12 1105  TROPONINI <0.30    Procedures and Diagnostic Studies: Dg Chest 2 View  12/09/2012   CLINICAL DATA:  FAILURE TO THRIVE NAUSEA EMESIS, COPD, smoker  EXAM: CHEST  2 VIEW  COMPARISON:  09/23/2011  FINDINGS: Lungs are hyperinflated. Sigmoid scoliosis noted. Normal cardiac silhouette. No effusion, infiltrate, or  pneumothorax.  IMPRESSION: Hyperinflated lungs without acute findings.   Electronically Signed   By: Suzy Bouchard M.D.   On: 12/09/2012 10:36   Ct Head Wo Contrast  12/09/2012   CLINICAL DATA:  Nausea, vomiting, failure to thrive.  EXAM: CT HEAD WITHOUT CONTRAST  TECHNIQUE: Contiguous axial images were obtained from the base of the skull through the vertex without intravenous contrast.  COMPARISON:  MRI 03/08/2009  FINDINGS: There is atrophy and chronic small vessel disease changes. No acute intracranial abnormality. Specifically, no hemorrhage, hydrocephalus, mass lesion, acute infarction, or significant intracranial injury. No acute calvarial abnormality. Visualized paranasal sinuses and mastoids clear. Orbital soft tissues unremarkable.  IMPRESSION: No acute intracranial abnormality.  Atrophy, chronic microvascular disease.   Electronically Signed   By: Rolm Baptise M.D.   On: 12/09/2012 10:51   Ct Abdomen Pelvis W Contrast  12/09/2012   CLINICAL DATA:  Two days of nausea and vomiting and dysphagia. There is history of COPD and reflux disease.  EXAM: CT ABDOMEN AND PELVIS WITH CONTRAST  TECHNIQUE: Multidetector CT imaging of the abdomen and pelvis was performed using the standard protocol following bolus administration of intravenous contrast. The patient also received oral contrast material.  CONTRAST:  21mL OMNIPAQUE IOHEXOL 300 MG/ML SOLN and oral contrast material  COMPARISON:  None.  FINDINGS: The liver exhibits no focal mass. There is mild intrahepatic ductal dilation consistent with previous cholecystectomy. Surgical clips are present in the gallbladder fossa. The common bile duct and pancreas exhibit no acute abnormalities. The spleen is not enlarged. The stomach is moderately distended with gas and fluid. The duodenum is not abnormally distended. The partially contrast filled loops of small bowel appear normal. There is some gas within proximal small bowel loops. There is fluid and gas within  the colon. There does not appear to be colonic obstruction or acute inflammation.  There are no adrenal masses. The kidneys enhance well with no evidence of stones or obstruction. The caliber of the abdominal aorta is normal but its course is tortuous. There is no evidence of a psoas abscess. The urinary bladder is quite distended despite the presence of the Foley catheter. The rectosigmoid colon appears normal. There is no free fluid in the pelvis. The uterus is surgically absent. There are no adnexal masses. On delayed images contrast within the renal collecting systems is normal.  There is generalized wasting of the subcutaneous fat consistent with the patient's failure to thrive.  At lung window settings there is basilar atelectasis posteriorly on the left. The patient has known lumbar levoscoliosis with significant disc space narrowing at multiple levels.  IMPRESSION: 1. There is mild intrahepatic ductal dilation consistent with previous cholecystectomy. There is no hepatosplenomegaly nor evidence of masses within these organs. The pancreas is atrophic without evidence of suspicious masses.  2. There is no evidence of the small or large bowel obstruction. There is a moderate amount of gas within the transverse and descending portions of the colon without evidence of obstruction.  3. There is no evidence  of urinary tract obstruction. There is distention of the urinary bladder however. Correlation as to the functioning of the foley catheter is needed.   Electronically Signed   By: David  Martinique   On: 12/09/2012 14:09    Scheduled Meds: . aspirin  81 mg Oral Daily  . clorazepate  7.5 mg Oral BID  . dorzolamide-timolol  1 drop Both Eyes BID  . enoxaparin (LOVENOX) injection  30 mg Subcutaneous Q24H  . famotidine  20 mg Oral Daily  . fluticasone  1 puff Inhalation BID  . hydrocortisone   Rectal BID  . Ipratropium-Albuterol  1 puff Inhalation TID  . mirtazapine  45 mg Oral QHS  . Travoprost (BAK Free)  1  drop Both Eyes QHS   Continuous Infusions: . 0.9 % NaCl with KCl 20 mEq / L 75 mL/hr at 12/11/12 0655  . dextrose 5 % and 0.45% NaCl 75 mL/hr at 12/10/12 0700    Time spent: 25 minutes.   LOS: 3 days   [facility] Pager [phone].   *Please note that the hospitalists switch teams on Wednesdays. Please call the flow manager at [phone] if you are having difficulty reaching the hospitalist taking care of this patient as she can update you and provide the most up-to-date pager number of provider caring for the patient. If 8PM-8AM, please contact night-coverage at [email or web address], password [facility]  12/12/2012, 10:17 AM

## 2012-12-12 NOTE — Discharge Summary (Deleted)
Physician Discharge Summary  Taylor Roth [id number] DOB: 02/21/1928 DOA: 12/09/2012  PCP: Jenny Reichmann, MD  Admit date: 12/09/2012 Discharge date: 12/12/2012  Recommendations for Outpatient Follow-up:  1. The patient is being discharged to a SNF. 2. Avoid NSAIDS and nephrotoxins, renally adjust medications given stage IV CKD.  Discharge Diagnoses:  Principal Problem:    Probable gastroenteritis with intractable nausea and vomiting Active Problems:    Depression    Anxiety    COPD (chronic obstructive pulmonary disease)    GERD (gastroesophageal reflux disease)    Macular degeneration    Failure to thrive    Protein-calorie malnutrition, severe    Hypokalemia    Orthostatic hypotension    Chronic kidney disease, stage IV (severe)    Arterial tortuosity (aorta)    Unspecified constipation  Discharge Condition: Improved.  Diet recommendation: Dysphagia III, low sodium, heart healthy.  History of present illness:  Taylor Roth is an 79 y.o. female with a PMH of COPD, major depression, anxiety, prior substance abuse, tobacco abuse who was admitted on 12/09/2012 with intractable nausea and vomiting with weakness and failure to thrive.  Hospital Course by problem:  Principal Problem:  Probable gastroenteritis intractable nausea and vomiting / dehydration / orthostatic hypotension  -Elevated BUN-creatinine ratio noted on admission, improved with IV fluids.  -Improved with supportive care including IV fluids and anti-emetics.  -Lipase not elevated. No elevation of LFTs.  Active Problems:  Constipation  -Continue bowel regimen with Dulcolax Q HS.  Arterial tortuosity of aorta / cerebrovascular disease  -Aortic tortuosity noted on admission CT. Head CT reveals small vessel disease.  -Troponin negative on admission.  -Continue aspirin therapy.  Stage IV CKD  -GFR 28.3. Avoid NSAIDs and nephrotoxins.  Hypokalemia  -Likely from GI losses.  Supplemented.  History of major depression/anxiety  -Continue Remeron and Tranxene.  COPD (chronic obstructive pulmonary disease)  -Has been refusing Combivent and Flovent. Continue Xopenex as needed.  GERD (gastroesophageal reflux disease)  -Continue Pepcid.  Macular degeneration  -Continue Travatan and Acular.  Failure to thrive  -Physical therapy following.  Protein-calorie malnutrition, severe, underweight  -Seen by dietitian 12/10/2012. Continue Ensure supplements.  Procedures:  None.  Consultations:  None.  Discharge Exam: Filed Vitals:   12/12/12 0839  BP: 149/82  Pulse: 59  Temp:   Resp:    Filed Vitals:   12/12/12 0510 12/12/12 0835 12/12/12 0837 12/12/12 0839  BP: 139/75 143/73 146/74 149/82  Pulse: 72 55 57 59  Temp: 97.6 F (36.4 C)     TempSrc: Oral     Resp: 20     Height:      Weight:      SpO2: 100% 100% 100% 100%   Gen: Frail, cachectic-appearing  Cardiovascular: RRR, No M/R/G  Respiratory: Lungs diminished  Gastrointestinal: Abdomen soft, tender left lower quadrant, + BS  Extremities: No C/E/C   Discharge Instructions      Discharge Orders   Future Appointments Provider Department Dept Phone   12/16/2012 9:15 AM Darlyne Russian, MD [facility] [phone]   Future Orders Complete By Expires   Call MD for:  persistant nausea and vomiting  As directed    Call MD for:  severe uncontrolled pain  As directed    Call MD for:  temperature >100.4  As directed    Diet - low sodium heart healthy  As directed    Scheduling Instructions:     Dysphagia III   Discharge instructions  As  directed    Comments:     You were cared for by Dr. Jacquelynn Cree  (a hospitalist) during your hospital stay. If you have any questions about your discharge medications or the care you received while you were in the hospital after you are discharged, you can call the unit and ask to speak with the hospitalist on call if the hospitalist that took care of  you is not available. Once you are discharged, your primary care physician will handle any further medical issues. Please note that NO REFILLS for any discharge medications will be authorized once you are discharged, as it is imperative that you return to your primary care physician (or establish a relationship with a primary care physician if you do not have one) for your aftercare needs so that they can reassess your need for medications and monitor your lab values.  Any outstanding tests can be reviewed by your PCP at your follow up visit.  It is also important to review any medicine changes with your PCP.  Please bring these d/c instructions with you to your next visit so your physician can review these changes with you.  If you do not have a primary care physician, you can call [phone] for a physician referral.  It is highly recommended that you obtain a PCP for hospital follow up.   Increase activity slowly  As directed    Walk with assistance  As directed    Walker   As directed        Medication List    STOP taking these medications       timolol 0.5 % ophthalmic solution  Commonly known as:  TIMOPTIC     VICOPROFEN PO      TAKE these medications       aspirin 81 MG chewable tablet  Chew 1 tablet (81 mg total) by mouth daily.     augmented betamethasone dipropionate 0.05 % cream  Commonly known as:  DIPROLENE-AF  Apply topically 2 (two) times daily.     bisacodyl 5 MG EC tablet  Commonly known as:  DULCOLAX  Take 1 tablet (5 mg total) by mouth at bedtime.     clorazepate 7.5 MG tablet  Commonly known as:  TRANXENE  TAKE 1 TABLET BY MOUTH THREE   TIMES DAILY     diphenhydrAMINE 50 MG capsule  Commonly known as:  BENADRYL  TAKE 1 CAPSULE BY MOUTH THREE TIMES DAILY     dorzolamide-timolol 22.3-6.8 MG/ML ophthalmic solution  Commonly known as:  COSOPT  Place 1 drop into both eyes 2 (two) times daily.     ENSURE COMPLETE SHAKE Liqd  Take 1 Bottle by mouth 2 (two)  times daily.     fluticasone 110 MCG/ACT inhaler  Commonly known as:  FLOVENT HFA  Inhale 1 puff into the lungs 2 (two) times daily.     HYDROcodone-acetaminophen 5-325 MG per tablet  Commonly known as:  NORCO/VICODIN  Take 1 tablet by mouth every 4 (four) hours as needed for pain.     hydrocortisone 2.5 % rectal cream  Commonly known as:  PROCTOSOL HC  apply twice a day to affected area     hydrOXYzine 25 MG tablet  Commonly known as:  ATARAX/VISTARIL  Take 1 tablet (25 mg total) by mouth 3 (three) times daily as needed.     Ipratropium-Albuterol 20-100 MCG/ACT Aers respimat  Commonly known as:  COMBIVENT  Inhale 1 puff into the lungs 3 (three) times daily.  ketorolac 0.5 % ophthalmic solution  Commonly known as:  ACULAR  Place 1 drop into both eyes daily. One drop in her eyes once a day     levalbuterol 45 MCG/ACT inhaler  Commonly known as:  XOPENEX HFA  Inhale 2 puffs every 4-6 hours as needed     nitroGLYCERIN 0.4 MG SL tablet  Commonly known as:  NITROSTAT  Place 0.4 mg under the tongue every 5 (five) minutes as needed.     ondansetron 4 MG tablet  Commonly known as:  ZOFRAN  Take 1 tablet (4 mg total) by mouth every 6 (six) hours as needed for nausea.     REMERON SOLTAB 45 MG disintegrating tablet  Generic drug:  mirtazapine  Dissolve 1 tablet on tongue with or without water at bedtime.     SYSTANE OP  Apply 1 drop to eye as needed (for dry eyes).     travoprost (benzalkonium) 0.004 % ophthalmic solution  Commonly known as:  TRAVATAN  Place 1 drop into both eyes at bedtime.     triazolam 0.25 MG tablet  Commonly known as:  HALCION  TAKE TWO   TABLETS BY MOUTH DAILY AT BEDTIME     ZANTAC 150 MG tablet  Generic drug:  ranitidine  TAKE 1 TABLET (150 MG TOTAL) BY MOUTH TWO   (TWO) TIMES DAILY.       Follow-up Information   Follow up with DAUB, STEVE A, MD. (At your appt time noted below.)    Specialty:  Family Medicine   Contact information:   [place] [id number] [phone]        The results of significant diagnostics from this hospitalization (including imaging, microbiology, ancillary and laboratory) are listed below for reference.    Significant Diagnostic Studies: Dg Chest 2 View  12/09/2012   CLINICAL DATA:  FAILURE TO THRIVE NAUSEA EMESIS, COPD, smoker  EXAM: CHEST  2 VIEW  COMPARISON:  09/23/2011  FINDINGS: Lungs are hyperinflated. Sigmoid scoliosis noted. Normal cardiac silhouette. No effusion, infiltrate, or pneumothorax.  IMPRESSION: Hyperinflated lungs without acute findings.   Electronically Signed   By: Suzy Bouchard M.D.   On: 12/09/2012 10:36   Ct Head Wo Contrast  12/09/2012   CLINICAL DATA:  Nausea, vomiting, failure to thrive.  EXAM: CT HEAD WITHOUT CONTRAST  TECHNIQUE: Contiguous axial images were obtained from the base of the skull through the vertex without intravenous contrast.  COMPARISON:  MRI 03/08/2009  FINDINGS: There is atrophy and chronic small vessel disease changes. No acute intracranial abnormality. Specifically, no hemorrhage, hydrocephalus, mass lesion, acute infarction, or significant intracranial injury. No acute calvarial abnormality. Visualized paranasal sinuses and mastoids clear. Orbital soft tissues unremarkable.  IMPRESSION: No acute intracranial abnormality.  Atrophy, chronic microvascular disease.   Electronically Signed   By: Rolm Baptise M.D.   On: 12/09/2012 10:51   Ct Abdomen Pelvis W Contrast  12/09/2012   CLINICAL DATA:  Two days of nausea and vomiting and dysphagia. There is history of COPD and reflux disease.  EXAM: CT ABDOMEN AND PELVIS WITH CONTRAST  TECHNIQUE: Multidetector CT imaging of the abdomen and pelvis was performed using the standard protocol following bolus administration of intravenous contrast. The patient also received oral contrast material.  CONTRAST:  67mL OMNIPAQUE IOHEXOL 300 MG/ML SOLN and oral contrast material  COMPARISON:  None.  FINDINGS:  The liver exhibits no focal mass. There is mild intrahepatic ductal dilation consistent with previous cholecystectomy. Surgical clips are present in the  gallbladder fossa. The common bile duct and pancreas exhibit no acute abnormalities. The spleen is not enlarged. The stomach is moderately distended with gas and fluid. The duodenum is not abnormally distended. The partially contrast filled loops of small bowel appear normal. There is some gas within proximal small bowel loops. There is fluid and gas within the colon. There does not appear to be colonic obstruction or acute inflammation.  There are no adrenal masses. The kidneys enhance well with no evidence of stones or obstruction. The caliber of the abdominal aorta is normal but its course is tortuous. There is no evidence of a psoas abscess. The urinary bladder is quite distended despite the presence of the Foley catheter. The rectosigmoid colon appears normal. There is no free fluid in the pelvis. The uterus is surgically absent. There are no adnexal masses. On delayed images contrast within the renal collecting systems is normal.  There is generalized wasting of the subcutaneous fat consistent with the patient's failure to thrive.  At lung window settings there is basilar atelectasis posteriorly on the left. The patient has known lumbar levoscoliosis with significant disc space narrowing at multiple levels.  IMPRESSION: 1. There is mild intrahepatic ductal dilation consistent with previous cholecystectomy. There is no hepatosplenomegaly nor evidence of masses within these organs. The pancreas is atrophic without evidence of suspicious masses.  2. There is no evidence of the small or large bowel obstruction. There is a moderate amount of gas within the transverse and descending portions of the colon without evidence of obstruction.  3. There is no evidence of urinary tract obstruction. There is distention of the urinary bladder however. Correlation as to the  functioning of the foley catheter is needed.   Electronically Signed   By: David  Martinique   On: 12/09/2012 14:09    Labs:  Basic Metabolic Panel:  Recent Labs Lab 12/09/12 1105 12/10/12 0330 12/11/12 0349  NA 144 139 142  K 3.3* 2.7* 4.3  CL 110 104 111  CO2 24 25 25   GLUCOSE 114* 111* 104*  BUN 26* 16 11  CREATININE 0.74 0.62 0.68  CALCIUM 8.9 8.8 9.0   GFR Estimated Creatinine Clearance: 28.3 ml/min (by C-G formula based on Cr of 0.68). Liver Function Tests:  Recent Labs Lab 12/09/12 1105 12/10/12 0330  AST 29 34  ALT 23 27  ALKPHOS 76 74  BILITOT 0.3 0.4  PROT 5.8* 5.5*  ALBUMIN 3.7 3.4*    Recent Labs Lab 12/09/12 1105  LIPASE 28   CBC:  Recent Labs Lab 12/09/12 1105 12/10/12 0330 12/11/12 0349  WBC 7.4 9.1 7.7  NEUTROABS 5.7  --   --   HGB 12.4 12.9 12.4  HCT 36.8 37.4 36.9  MCV 89.3 88.8 88.5  PLT 177 169 173   Cardiac Enzymes:  Recent Labs Lab 12/09/12 1105  TROPONINI <0.30    Time coordinating discharge: 35 minutes.  Signed:  Tristian Bouska  Pager [phone] [name] 12/12/2012, 2:54 PM

## 2012-12-12 NOTE — Progress Notes (Signed)
Physical Therapy Treatment Patient Details Name: Taylor Roth MRN: [id number] DOB: [birth year] Today's Date: 12/12/2012 Time: [id number] PT Time Calculation (min): 28 min  PT Assessment / Plan / Recommendation  History of Present Illness Pt is a 79 y.o. female has a past medical history significant for COPD, Depression, anxiety, prior substance abuse, presents to the ED with a chief complaint of weakness. She has been having severe nausea for the past 3 days and was unable to eat much (per daughter she only had 2 cups of yoghurt in 2 days) and was unable to drink enough water. She has no appetite due to her nausea. She denies any problems swallowing her food and denies pain with eating. She has no chest pain or abdominal pain. She has no diarrhea. Denies fever or chills. She denies dysuria. Today she felt too weak to even walk and was unable to answer the door to her house. Daughter also endorses weight loss, she was 78 lbs in August, 77 in September and just couple of weeks ago she was down to 73. She has a history of tobacco abuse and is still smoking. Patient states that she "quit 3 days ago" however daughter states that this is due to the fact that she is too weak to get to her "smoking room" in her house. In the ED labs remarkable for mild dehydration.   PT Comments   Pt demonstrating progress as she was able to ambulate 15' with RW and min assist and perform LE strengthening exercises in supine today. Pt continues to be limited by pain and fatigue. Pt would continue to benefit from skilled PT to improve functional mobility and safety.  Follow Up Recommendations  Supervision for mobility/OOB;SNF     Does the patient have the potential to tolerate intense rehabilitation     Barriers to Discharge        Equipment Recommendations  None recommended by PT    Recommendations for Other Services    Frequency Min 3X/week   Progress towards PT Goals Progress towards PT goals: Progressing  toward goals  Plan Current plan remains appropriate    Precautions / Restrictions Precautions Precautions: Fall Restrictions Weight Bearing Restrictions: No   Pertinent Vitals/Pain No c/o pain at rest with increase in abdominal pain during exercises, unable to rate. Pt took frequent rest breaks during exercises to decrease pain and fatigue. Pt positioned to comfort at end of session.    Mobility  Bed Mobility Bed Mobility: Supine to Sit;Sitting - Scoot to Edge of Bed Supine to Sit: 5: Supervision Sitting - Scoot to Edge of Bed: 5: Supervision Details for Bed Mobility Assistance: supervision to ensure safety.  Transfers Transfers: Sit to Stand;Stand to Sit Sit to Stand: 4: Min guard;From bed;With upper extremity assist;From toilet Stand to Sit: 4: Min guard;With upper extremity assist;With armrests;To chair/3-in-1;To toilet Details for Transfer Assistance: min guard to ensure safety and VC's for hand placement.  Ambulation/Gait Ambulation/Gait Assistance: 4: Min assist Ambulation Distance (Feet): 15 Feet Assistive device: Rolling walker Ambulation/Gait Assistance Details: pt ambulated from bed to bathroom and back to chair with min A to negotiate RW around obstacles and due to weakness. VC's for gait sequence and to negotiate objects. Gait Pattern: Step-through pattern;Decreased stride length;Narrow base of support;Trunk flexed Gait velocity: decreased Stairs: No    Exercises General Exercises - Lower Extremity Ankle Circles/Pumps: AROM;20 reps;Supine;Both (pt required rest breaks during all LE strengthening exercises due to fatigue) Quad Sets: AROM;Supine;Both;10 reps Gluteal Sets: AROM;Supine;Both;20 reps Short Arc  Quad: AROM;Supine;Both;10 reps Heel Slides: AAROM;Supine;5 reps;Both (ceased due to abdominal pain) Hip ABduction/ADduction: AAROM;Supine;Both;10 reps   PT Diagnosis:    PT Problem List:   PT Treatment Interventions:     PT Goals (current goals can now be found  in the care plan section)    Visit Information  Last PT Received On: 12/12/12 Assistance Needed: +1 History of Present Illness: Pt is a 79 y.o. female has a past medical history significant for COPD, Depression, anxiety, prior substance abuse, presents to the ED with a chief complaint of weakness. She has been having severe nausea for the past 3 days and was unable to eat much (per daughter she only had 2 cups of yoghurt in 2 days) and was unable to drink enough water. She has no appetite due to her nausea. She denies any problems swallowing her food and denies pain with eating. She has no chest pain or abdominal pain. She has no diarrhea. Denies fever or chills. She denies dysuria. Today she felt too weak to even walk and was unable to answer the door to her house. Daughter also endorses weight loss, she was 78 lbs in August, 77 in September and just couple of weeks ago she was down to 73. She has a history of tobacco abuse and is still smoking. Patient states that she "quit 3 days ago" however daughter states that this is due to the fact that she is too weak to get to her "smoking room" in her house. In the ED labs remarkable for mild dehydration.    Subjective Data      Cognition  Cognition Arousal/Alertness: Awake/alert Behavior During Therapy: WFL for tasks assessed/performed Overall Cognitive Status: Within Functional Limits for tasks assessed    Balance  Balance Balance Assessed: Yes Dynamic Standing Balance Dynamic Standing - Balance Support: No upper extremity supported;During functional activity Dynamic Standing - Level of Assistance: 5: Stand by assistance Dynamic Standing - Balance Activities: Other (comment) (performing bathroom and hand hygiene.) Dynamic Standing - Comments: pt able to stand for 1 minute with min guard and no UE support while performing hand/bathroom hygiene.  End of Session PT - End of Session Equipment Utilized During Treatment: Gait belt Activity  Tolerance: Patient limited by fatigue;Patient limited by pain Patient left: in chair;with call bell/phone within reach;Other (comment) (social worker who reports she sees pt outside of hospital) Nurse Communication: Other (comment) (pt tried to have BM on toilet but was not able to and reports abdominal pain from not having BM.)   GP     Audie Clear 12/12/2012, 12:32 PM

## 2012-12-12 NOTE — Progress Notes (Signed)
CSW assisting with d/c planning. Pt's daughter is leaving hospital to [place] . She will call pt's nsg to have him make arrangements for P-TAR trans. to facility. [facility] prefers not to accept weekend d/c but is ok with late afternoon / evening d/c today.  Werner Lean LCSW [phone]

## 2012-12-12 NOTE — Progress Notes (Signed)
The patient's daughter refuses transfer to [facility].  She wants her mother evaluated by a psychiatrist and wants to look at other facilities.  D/C cancelled.  Psychiatric consultation requested, per daughter's wishes, to evaluate history of bipolar disorder, non-compliant with medication.  RAMA,CHRISTINA 12/12/2012 5:38 PM

## 2012-12-13 ENCOUNTER — Telehealth: Payer: Self-pay

## 2012-12-13 DIAGNOSIS — F411 Generalized anxiety disorder: Secondary | ICD-10-CM

## 2012-12-13 DIAGNOSIS — F329 Major depressive disorder, single episode, unspecified: Secondary | ICD-10-CM

## 2012-12-13 NOTE — Telephone Encounter (Signed)
Daub - Pt's daughter called wanting to talk to Dr. Everlene Farrier asap.  Her mother is in the hospital and she wants to talk to him about this. [phone]

## 2012-12-13 NOTE — Telephone Encounter (Signed)
I called and spoke with the daughter [name]. I told her I could not order a psychiatric evaluation with the patient in the hospital under the care of Dr. Darrick Meigs. I told her that she would have to get that requested by Dr. Darrick Meigs. I told her I would not be willing to write for any pain medication or mood altering medications. I told her she should be in a care facility and should have a evaluation of her mental health and decision made as to what drugs are appropriate for treatment. I told her it U. she was in a skilled nursing facility I would talk to the manager there and see if a psychiatric evaluation could be obtained.

## 2012-12-13 NOTE — Progress Notes (Signed)
[name] PROGRESS NOTE  Taylor Roth [id number] DOB: [birth year] DOA: 12/09/2012 PCP: Jenny Reichmann, MD  Brief narrative: Taylor Roth is an 79 y.o. female with a PMH of COPD, major depression, anxiety, prior substance abuse, tobacco abuse who was admitted on 12/09/2012 with intractable nausea and vomiting with weakness and failure to thrive.  D/C'd to [facility] 12/12/12 but daughter refused D/C after touring facility.  Assessment/Plan: Principal Problem:   Probable gastroenteritis intractable nausea and vomiting / dehydration / orthostatic hypotension The patient was noted to have an elevated BUN-creatinine ratio noted on admission, secondary to dehydration in the setting of intractable nausea and vomiting. She was also found to be orthostatic. Her symptoms and laboratory values improved with IV fluids, and she is now stable for discharge.  Active Problems:   Constipation Patient complains of chronic constipation for which she normally takes Dulcolax. She was given a Fleet enema 10/31 and put on Dulcolax Q HS. Her bowels moved 12/12/2012.   Arterial tortuosity of aorta / cerebrovascular disease Aortic tortuosity noted on admission CT. Head CT revealed small vessel disease. Troponin negative on admission. Patient was placed on aspirin therapy.   Stage IV CKD The patient was noted to have significant reduction in her GFR. Neither she or her daughter were aware of her chronic kidney disease. It was fully explained to her that in order to preserve her remaining kidney function, she needed to avoid NSAIDs and other nephrotoxins. Vicoprofen was subsequently discontinued after ascertaining that her allergy to Vicodin was related to overuse with subsequent development of jaundice.   Hypokalemia Felt to be from GI losses. Her potassium corrected with supplementation.   History of major depression/anxiety Patient daughter reports that she has a history of bipolar disorder and has been  noncompliant with medication to treat this. She requests a formal psychiatry evaluation.   COPD (chronic obstructive pulmonary disease) The patient has been refusing Combivent and Flovent. Continue Xopenex as needed.   GERD (gastroesophageal reflux disease) Reflux symptoms controlled on Pepcid.   Macular degeneration Continue Travatan and Acular.   Failure to thrive Physical therapy following with plans to discharge to a rehabilitation facility.   Protein-calorie malnutrition, severe, underweight Seen by dietitian 12/10/2012. Appears frail and is underweight. Continue Ensure supplements.   Code Status: DNR Family Communication: Daughter updated at bedside 12/12/12.  [name] [phone]). Disposition Plan: SNF.   Medical Consultants:  Psychiatry  Other Consultants:  Physical therapy.  Anti-infectives:  None.  HPI/Subjective: Taylor Roth says her left lower quadrant abdominal pain has improved since her bowels moved yesterday. She has eaten all of her breakfast. No complaints of dyspnea or cough.  Objective: Filed Vitals:   12/12/12 0835 12/12/12 0837 12/12/12 0839 12/12/12 2203  BP: 143/73 146/74 149/82 131/60  Pulse: 55 57 59 74  Temp:    98.6 F (37 C)  TempSrc:    Oral  Resp:    16  Height:      Weight:      SpO2: 100% 100% 100% 98%    Intake/Output Summary (Last 24 hours) at 12/13/12 [id number] Last data filed at 12/12/12 2204  Gross per 24 hour  Intake    240 ml  Output    100 ml  Net    140 ml    Exam: Gen:  Frail, cachectic-appearing Cardiovascular:  RRR, No M/R/G Respiratory:  Lungs diminished Gastrointestinal:  Abdomen soft, tender left lower quadrant, + BS Extremities:  No C/E/C  Data Reviewed: Basic Metabolic  Panel:  Recent Labs Lab 12/09/12 1105 12/10/12 0330 12/11/12 0349  NA 144 139 142  K 3.3* 2.7* 4.3  CL 110 104 111  CO2 24 25 25   GLUCOSE 114* 111* 104*  BUN 26* 16 11  CREATININE 0.74 0.62 0.68  CALCIUM 8.9 8.8 9.0    GFR Estimated Creatinine Clearance: 28.3 ml/min (by C-G formula based on Cr of 0.68). Liver Function Tests:  Recent Labs Lab 12/09/12 1105 12/10/12 0330  AST 29 34  ALT 23 27  ALKPHOS 76 74  BILITOT 0.3 0.4  PROT 5.8* 5.5*  ALBUMIN 3.7 3.4*    Recent Labs Lab 12/09/12 1105  LIPASE 28   CBC:  Recent Labs Lab 12/09/12 1105 12/10/12 0330 12/11/12 0349  WBC 7.4 9.1 7.7  NEUTROABS 5.7  --   --   HGB 12.4 12.9 12.4  HCT 36.8 37.4 36.9  MCV 89.3 88.8 88.5  PLT 177 169 173   Cardiac Enzymes:  Recent Labs Lab 12/09/12 1105  TROPONINI <0.30    Procedures and Diagnostic Studies: Dg Chest 2 View  12/09/2012   CLINICAL DATA:  FAILURE TO THRIVE NAUSEA EMESIS, COPD, smoker  EXAM: CHEST  2 VIEW  COMPARISON:  09/23/2011  FINDINGS: Lungs are hyperinflated. Sigmoid scoliosis noted. Normal cardiac silhouette. No effusion, infiltrate, or pneumothorax.  IMPRESSION: Hyperinflated lungs without acute findings.   Electronically Signed   By: Suzy Bouchard M.D.   On: 12/09/2012 10:36   Ct Head Wo Contrast  12/09/2012   CLINICAL DATA:  Nausea, vomiting, failure to thrive.  EXAM: CT HEAD WITHOUT CONTRAST  TECHNIQUE: Contiguous axial images were obtained from the base of the skull through the vertex without intravenous contrast.  COMPARISON:  MRI 03/08/2009  FINDINGS: There is atrophy and chronic small vessel disease changes. No acute intracranial abnormality. Specifically, no hemorrhage, hydrocephalus, mass lesion, acute infarction, or significant intracranial injury. No acute calvarial abnormality. Visualized paranasal sinuses and mastoids clear. Orbital soft tissues unremarkable.  IMPRESSION: No acute intracranial abnormality.  Atrophy, chronic microvascular disease.   Electronically Signed   By: Rolm Baptise M.D.   On: 12/09/2012 10:51   Ct Abdomen Pelvis W Contrast  12/09/2012   CLINICAL DATA:  Two days of nausea and vomiting and dysphagia. There is history of COPD and reflux  disease.  EXAM: CT ABDOMEN AND PELVIS WITH CONTRAST  TECHNIQUE: Multidetector CT imaging of the abdomen and pelvis was performed using the standard protocol following bolus administration of intravenous contrast. The patient also received oral contrast material.  CONTRAST:  55mL OMNIPAQUE IOHEXOL 300 MG/ML SOLN and oral contrast material  COMPARISON:  None.  FINDINGS: The liver exhibits no focal mass. There is mild intrahepatic ductal dilation consistent with previous cholecystectomy. Surgical clips are present in the gallbladder fossa. The common bile duct and pancreas exhibit no acute abnormalities. The spleen is not enlarged. The stomach is moderately distended with gas and fluid. The duodenum is not abnormally distended. The partially contrast filled loops of small bowel appear normal. There is some gas within proximal small bowel loops. There is fluid and gas within the colon. There does not appear to be colonic obstruction or acute inflammation.  There are no adrenal masses. The kidneys enhance well with no evidence of stones or obstruction. The caliber of the abdominal aorta is normal but its course is tortuous. There is no evidence of a psoas abscess. The urinary bladder is quite distended despite the presence of the Foley catheter. The rectosigmoid colon appears normal. There  is no free fluid in the pelvis. The uterus is surgically absent. There are no adnexal masses. On delayed images contrast within the renal collecting systems is normal.  There is generalized wasting of the subcutaneous fat consistent with the patient's failure to thrive.  At lung window settings there is basilar atelectasis posteriorly on the left. The patient has known lumbar levoscoliosis with significant disc space narrowing at multiple levels.  IMPRESSION: 1. There is mild intrahepatic ductal dilation consistent with previous cholecystectomy. There is no hepatosplenomegaly nor evidence of masses within these organs. The pancreas is  atrophic without evidence of suspicious masses.  2. There is no evidence of the small or large bowel obstruction. There is a moderate amount of gas within the transverse and descending portions of the colon without evidence of obstruction.  3. There is no evidence of urinary tract obstruction. There is distention of the urinary bladder however. Correlation as to the functioning of the foley catheter is needed.   Electronically Signed   By: David  Martinique   On: 12/09/2012 14:09    Scheduled Meds: . aspirin  81 mg Oral Daily  . bisacodyl  5 mg Oral QHS  . clorazepate  7.5 mg Oral BID  . dorzolamide-timolol  1 drop Both Eyes BID  . enoxaparin (LOVENOX) injection  30 mg Subcutaneous Q24H  . famotidine  20 mg Oral Daily  . hydrocortisone   Rectal BID  . mirtazapine  45 mg Oral QHS  . Travoprost (BAK Free)  1 drop Both Eyes QHS   Continuous Infusions:    Time spent: 25 minutes.   LOS: 4 days   [facility] Pager [phone].   *Please note that the hospitalists switch teams on Wednesdays. Please call the flow manager at [phone] if you are having difficulty reaching the hospitalist taking care of this patient as she can update you and provide the most up-to-date pager number of provider caring for the patient. If 8PM-8AM, please contact night-coverage at [email or web address], password [facility]  12/13/2012, 7:27 AM

## 2012-12-13 NOTE — Progress Notes (Signed)
Spoke with Pt's daughter, Mrs. Ebony Hail, via phone re: Pt's d/c.   Pt's spoke about several different issues surrounding her mother's d/c, none of which involved placement.  Pt's daughter wanting a psych eval for Pt and suggested that CSW contact "Ebony Hail" for further details.  When CSW asked about Ebony Hail, Pt's daughter stated that she didn't know where she worked or how to get up with her but that Ebony Hail instructed her to take Pt to [facility] for a 72 hr eval.  CSW attempted to re-direct Mrs. Ebony Hail and focus on Pt's d/c and placement at [facility].  Mrs. Ebony Hail stated that she has been looking at facilities today and that none of them can take Pt over the weekend.  CSW informed Mrs. Ebony Hail that [facility] has Pt's bed offers and suggested that she focus her efforts on the facilities that have offered Pt a bed and that can accept Pt over the weekend.  Mrs. Ebony Hail agreed to meet with CSW at 1500 in Pt's room.  Update:  Pt's daughter had not arrived by 1530.  LM for Pt's daughter.  Per RN, Pt's other daughter called stating her concerns re: Pt leaving prior to Pt's psych eval.    Updated MD re: situation.  Spoke with psych MD who stated that he'd be by within 30 mins to evaluate Pt.  Bernita Raisin, [place] [employer] [phone]
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
## 2012-12-13 NOTE — Progress Notes (Signed)
Met with Pt, Pt's daughter and psychiatrist.  Psychiatrist feels that Pt needs f/u outpt tx.  Pt's daughter insistent on a 36 hour evaluation.  CSW nor psych MD aware of what this is; CSW discussed 72-hour hold, as it relates to IVC.  All agreed that this wasn't appropriate for Pt.  Pt's daughter reviewing bed offers but upset at the situation.  Pt's daughter feels that she wasn't given adequate opportunity to make SNF decision.  Pt and daughter to discuss situation.  CSW to meet with them again shortly.  Update:  Met with Pt and daughter again.  Both are still in disagreement in d/c plan.    Pt's daughter wanting to take Pt home; Pt not sure what to do.  Pt doesn't want to go to SNF today, if she decides that this is her d/c plan.  CSW to meet with Pt tomorrow, if Pt is still here, to assist with SNF placement.  Bernita Raisin, [place] [employer] [phone]

## 2012-12-14 MED ORDER — HYDROCODONE-ACETAMINOPHEN 5-325 MG PO TABS: 1.0000 | ORAL_TABLET | ORAL | Status: DC | PRN

## 2012-12-14 NOTE — Progress Notes (Signed)
Per Tammie, [facility] liaison, facility can accept Pt today.  Per MD, Pt ready for d/c.  Notified RN, Pt, family (both daughters) and facility.  Sent d/c summary.  Confirmed receipt of d/c summary.  Facility ready to receive Pt.  Arranged for transportation.  Bernita Raisin, [place] [employer] [phone]

## 2012-12-14 NOTE — Progress Notes (Signed)
Report called to Environmental consultant at [facility]. Awaiting transportation.

## 2012-12-14 NOTE — Progress Notes (Signed)
Met with Pt to discuss the rehab facilities that are able to accept Pts over the weekend.  Pt stated that her daughter visited some of the facilities yesterday and ruled out many of them.  CSW explained to Pt that, per MD, Pt is medically ready to go and that the recommendation, again, is for SNF.  However, CSW explained to Pt that if she doesn't want to go to SNF, she can d/c home with 24 hr care.  Pt asked CSW to contact her daughter to discuss the situation with her.  LM for Pt's daughter, Mrs. Ebony Hail.  Bernita Raisin, [place] [employer] [phone]

## 2012-12-14 NOTE — Progress Notes (Signed)
The patient was discharged on 12/12/2012, but her daughter refused transfer after touring the facility. She was given alternative bed offers on 12/13/2012, however the family insisted on a psychiatric evaluation prior to discharge. She was seen by the psychiatrist who recommended outpatient psychiatry followup. Given that the evaluation was performed late in the day, her discharge was held until today. Plans were to discharge her to [facility], but when the transporter arrived, she refused to go. She was made aware of her right to have Medicare evaluate her medical records if she disagreed with the discharge. Family subsequently elected to take her home. Home health physical therapy and a social worker was ordered on discharge.  Jalan Bodi 12/14/2012 6:09 PM

## 2012-12-14 NOTE — Progress Notes (Signed)
Spoke with Pt and let her know that I've not been able to reach her daughter.  Pt gave CSW permission to contact her other daughter, Mrs. Savage.  Spoke with Mrs. Savage and updated her on the situation.  Mrs. Kendall Flack, after speaking with Dr. Rockne Menghini, gave CSW permission to contact facilities that could potentially accept Pt today.  CSW thanked Mrs. Savage for her time.  Spoke with Pt's other daughter, Mrs. Ebony Hail.    Updated Mrs. Ebony Hail on the situation.  Mrs. Ebony Hail stated that she'd like for Pt to go to [facility], if possible.  CSW thanked Mrs. Ebony Hail for her time.  Contact made with [facility].  CSW to continue to follow.  Bernita Raisin, [place] [employer] [phone]

## 2012-12-14 NOTE — Discharge Summary (Signed)
Physician Discharge Summary  Taylor Roth [id number] DOB: Dec 27, 1928 DOA: 12/09/2012  PCP: Jenny Reichmann, MD  Admit date: 12/09/2012 Discharge date: 12/14/2012  Recommendations for Outpatient Follow-up:  1. The patient is being discharged to a SNF. 2. Avoid NSAIDS and nephrotoxins, renally adjust medications given stage IV CKD. 3. Followup at [facility] for further psychiatric medication adjustments at scheduled appointment 12/29/2012.   Discharge Diagnoses:  Principal Problem:    Probable gastroenteritis with intractable nausea and vomiting Active Problems:    Depression    Anxiety    COPD (chronic obstructive pulmonary disease)    GERD (gastroesophageal reflux disease)    Macular degeneration    Failure to thrive    Protein-calorie malnutrition, severe    Hypokalemia    Orthostatic hypotension    Chronic kidney disease, stage IV (severe)    Arterial tortuosity (aorta)    Unspecified constipation   Discharge Condition: Improved.   Diet recommendation: Dysphagia III, low sodium, heart healthy.   History of present illness:  Taylor Roth is an 79 y.o. female with a PMH of COPD, major depression, anxiety, prior substance abuse, tobacco abuse who was admitted on 12/09/2012 with intractable nausea and vomiting with weakness and failure to thrive.  Hospital Course by problem:  Principal Problem:  Probable gastroenteritis intractable nausea and vomiting / dehydration / orthostatic hypotension  The patient was noted to have an elevated BUN-creatinine ratio noted on admission, secondary to dehydration in the setting of intractable nausea and vomiting. She was also found to be orthostatic. Her symptoms and laboratory values improved with IV fluids, and she remained stable for discharge, pending selection of a SNF bed.  Active Problems:  Constipation  Patient complains of chronic constipation for which she normally takes Dulcolax. She was given a Fleet enema  10/31 and put on Dulcolax Q HS. Her bowels moved 12/12/2012.  Arterial tortuosity of aorta / cerebrovascular disease  Aortic tortuosity noted on admission CT. Head CT revealed small vessel disease. Troponin negative on admission.  Patient was placed on aspirin therapy.  Stage IV CKD  The patient was noted to have significant reduction in her GFR. Neither she or her daughter were aware of her chronic kidney disease. It was fully explained to her that in order to preserve her remaining kidney function, she needed to avoid NSAIDs and other nephrotoxins. Vicoprofen was subsequently discontinued after ascertaining that her allergy to Vicodin was related to overuse with subsequent development of jaundice.  Hypokalemia  Felt to be from GI losses. Her potassium corrected with supplementation.  History of major depression/anxiety  Patient daughter reports that she has a history of bipolar disorder and has been noncompliant with medication to treat this. She requested a formal psychiatry evaluation. Dr. Aneta Mins saw her 12/13/12 and recommended outpatient F/U with a psychiatrist for medication management. No medication changes advised. Has scheduled F/U at [facility] on 12/29/12.  COPD (chronic obstructive pulmonary disease)  The patient has been refusing Combivent and Flovent. Continue Xopenex as needed.  GERD (gastroesophageal reflux disease)  Reflux symptoms controlled on Pepcid.  Macular degeneration  Continue Travatan and Acular.  Failure to thrive  Physical therapy following with plans to discharge to a rehabilitation facility.  Protein-calorie malnutrition, severe, underweight  Seen by dietitian 12/10/2012. Appears frail and is underweight. Continue Ensure supplements.   Procedures:  None  Consultations:  Dr. Dereck Leep, Psychiatry.  Discharge Exam: Filed Vitals:   12/14/12 0510  BP: 114/58  Pulse: 62  Temp: 98 F (36.7 C)  Resp: 16   Filed Vitals:   12/13/12 0700 12/13/12 1415  12/13/12 2117 12/14/12 0510  BP: 122/57 126/67 109/83 114/58  Pulse: 66 75 70 62  Temp: 98.2 F (36.8 C) 98 F (36.7 C) 97.5 F (36.4 C) 98 F (36.7 C)  TempSrc: Oral Oral Oral Oral  Resp: 16 16 16 16   Height:      Weight:      SpO2: 97% 97% 100% 100%    Gen:  NAD, frail, cachectic appearing Cardiovascular:  RRR, No M/R/G Respiratory: Lungs CTAB Gastrointestinal: Abdomen soft, NT/ND with normal active bowel sounds. Extremities: No C/E/C   Discharge Instructions  Discharge Orders   Future Appointments Provider Department Dept Phone   12/16/2012 9:15 AM Darlyne Russian, MD [facility] [phone]   Future Orders Complete By Expires   Call MD for:  persistant nausea and vomiting  As directed    Call MD for:  severe uncontrolled pain  As directed    Call MD for:  temperature >100.4  As directed    Diet - low sodium heart healthy  As directed    Scheduling Instructions:     Dysphagia III   Discharge instructions  As directed    Comments:     You were cared for by Dr. Jacquelynn Cree  (a hospitalist) during your hospital stay. If you have any questions about your discharge medications or the care you received while you were in the hospital after you are discharged, you can call the unit and ask to speak with the hospitalist on call if the hospitalist that took care of you is not available. Once you are discharged, your primary care physician will handle any further medical issues. Please note that NO REFILLS for any discharge medications will be authorized once you are discharged, as it is imperative that you return to your primary care physician (or establish a relationship with a primary care physician if you do not have one) for your aftercare needs so that they can reassess your need for medications and monitor your lab values.  Any outstanding tests can be reviewed by your PCP at your follow up visit.  It is also important to review any medicine changes with your PCP.   Please bring these d/c instructions with you to your next visit so your physician can review these changes with you.  If you do not have a primary care physician, you can call [phone] for a physician referral.  It is highly recommended that you obtain a PCP for hospital follow up.   Increase activity slowly  As directed    Increase activity slowly  As directed    Walk with assistance  As directed    Walker   As directed        Medication List    STOP taking these medications       timolol 0.5 % ophthalmic solution  Commonly known as:  TIMOPTIC     VICOPROFEN PO      TAKE these medications       aspirin 81 MG chewable tablet  Chew 1 tablet (81 mg total) by mouth daily.     augmented betamethasone dipropionate 0.05 % cream  Commonly known as:  DIPROLENE-AF  Apply topically 2 (two) times daily.     bisacodyl 5 MG EC tablet  Commonly known as:  DULCOLAX  Take 1 tablet (5 mg total) by mouth at bedtime.     clorazepate 7.5 MG tablet  Commonly  known as:  TRANXENE  TAKE 1 TABLET BY MOUTH THREE   TIMES DAILY     diphenhydrAMINE 50 MG capsule  Commonly known as:  BENADRYL  TAKE 1 CAPSULE BY MOUTH THREE TIMES DAILY     dorzolamide-timolol 22.3-6.8 MG/ML ophthalmic solution  Commonly known as:  COSOPT  Place 1 drop into both eyes 2 (two) times daily.     ENSURE COMPLETE SHAKE Liqd  Take 1 Bottle by mouth 2 (two) times daily.     fluticasone 110 MCG/ACT inhaler  Commonly known as:  FLOVENT HFA  Inhale 1 puff into the lungs 2 (two) times daily.     HYDROcodone-acetaminophen 5-325 MG per tablet  Commonly known as:  NORCO/VICODIN  Take 1 tablet by mouth every 4 (four) hours as needed for pain.     HYDROcodone-acetaminophen 5-325 MG per tablet  Commonly known as:  NORCO/VICODIN  Take 1-2 tablets by mouth every 4 (four) hours as needed.     hydrocortisone 2.5 % rectal cream  Commonly known as:  PROCTOSOL HC  apply twice a day to affected area     hydrOXYzine 25 MG  tablet  Commonly known as:  ATARAX/VISTARIL  Take 1 tablet (25 mg total) by mouth 3 (three) times daily as needed.     Ipratropium-Albuterol 20-100 MCG/ACT Aers respimat  Commonly known as:  COMBIVENT  Inhale 1 puff into the lungs 3 (three) times daily.     ketorolac 0.5 % ophthalmic solution  Commonly known as:  ACULAR  Place 1 drop into both eyes daily. One drop in her eyes once a day     levalbuterol 45 MCG/ACT inhaler  Commonly known as:  XOPENEX HFA  Inhale 2 puffs every 4-6 hours as needed     nitroGLYCERIN 0.4 MG SL tablet  Commonly known as:  NITROSTAT  Place 0.4 mg under the tongue every 5 (five) minutes as needed.     ondansetron 4 MG tablet  Commonly known as:  ZOFRAN  Take 1 tablet (4 mg total) by mouth every 6 (six) hours as needed for nausea.     REMERON SOLTAB 45 MG disintegrating tablet  Generic drug:  mirtazapine  Dissolve 1 tablet on tongue with or without water at bedtime.     SYSTANE OP  Apply 1 drop to eye as needed (for dry eyes).     travoprost (benzalkonium) 0.004 % ophthalmic solution  Commonly known as:  TRAVATAN  Place 1 drop into both eyes at bedtime.     triazolam 0.25 MG tablet  Commonly known as:  HALCION  TAKE TWO   TABLETS BY MOUTH DAILY AT BEDTIME     ZANTAC 150 MG tablet  Generic drug:  ranitidine  TAKE 1 TABLET (150 MG TOTAL) BY MOUTH TWO   (TWO) TIMES DAILY.           Follow-up Information   Follow up with DAUB, STEVE A, MD. (At your appt time noted below.)    Specialty:  Family Medicine   Contact information:   [place] [id number] [phone]        The results of significant diagnostics from this hospitalization (including imaging, microbiology, ancillary and laboratory) are listed below for reference.    Significant Diagnostic Studies: Dg Chest 2 View  12/09/2012   CLINICAL DATA:  FAILURE TO THRIVE NAUSEA EMESIS, COPD, smoker  EXAM: CHEST  2 VIEW  COMPARISON:  09/23/2011  FINDINGS: Lungs are  hyperinflated. Sigmoid scoliosis noted. Normal cardiac silhouette. No  effusion, infiltrate, or pneumothorax.  IMPRESSION: Hyperinflated lungs without acute findings.   Electronically Signed   By: Suzy Bouchard M.D.   On: 12/09/2012 10:36   Ct Head Wo Contrast  12/09/2012   CLINICAL DATA:  Nausea, vomiting, failure to thrive.  EXAM: CT HEAD WITHOUT CONTRAST  TECHNIQUE: Contiguous axial images were obtained from the base of the skull through the vertex without intravenous contrast.  COMPARISON:  MRI 03/08/2009  FINDINGS: There is atrophy and chronic small vessel disease changes. No acute intracranial abnormality. Specifically, no hemorrhage, hydrocephalus, mass lesion, acute infarction, or significant intracranial injury. No acute calvarial abnormality. Visualized paranasal sinuses and mastoids clear. Orbital soft tissues unremarkable.  IMPRESSION: No acute intracranial abnormality.  Atrophy, chronic microvascular disease.   Electronically Signed   By: Rolm Baptise M.D.   On: 12/09/2012 10:51   Ct Abdomen Pelvis W Contrast  12/09/2012   CLINICAL DATA:  Two days of nausea and vomiting and dysphagia. There is history of COPD and reflux disease.  EXAM: CT ABDOMEN AND PELVIS WITH CONTRAST  TECHNIQUE: Multidetector CT imaging of the abdomen and pelvis was performed using the standard protocol following bolus administration of intravenous contrast. The patient also received oral contrast material.  CONTRAST:  20mL OMNIPAQUE IOHEXOL 300 MG/ML SOLN and oral contrast material  COMPARISON:  None.  FINDINGS: The liver exhibits no focal mass. There is mild intrahepatic ductal dilation consistent with previous cholecystectomy. Surgical clips are present in the gallbladder fossa. The common bile duct and pancreas exhibit no acute abnormalities. The spleen is not enlarged. The stomach is moderately distended with gas and fluid. The duodenum is not abnormally distended. The partially contrast filled loops of small bowel  appear normal. There is some gas within proximal small bowel loops. There is fluid and gas within the colon. There does not appear to be colonic obstruction or acute inflammation.  There are no adrenal masses. The kidneys enhance well with no evidence of stones or obstruction. The caliber of the abdominal aorta is normal but its course is tortuous. There is no evidence of a psoas abscess. The urinary bladder is quite distended despite the presence of the Foley catheter. The rectosigmoid colon appears normal. There is no free fluid in the pelvis. The uterus is surgically absent. There are no adnexal masses. On delayed images contrast within the renal collecting systems is normal.  There is generalized wasting of the subcutaneous fat consistent with the patient's failure to thrive.  At lung window settings there is basilar atelectasis posteriorly on the left. The patient has known lumbar levoscoliosis with significant disc space narrowing at multiple levels.  IMPRESSION: 1. There is mild intrahepatic ductal dilation consistent with previous cholecystectomy. There is no hepatosplenomegaly nor evidence of masses within these organs. The pancreas is atrophic without evidence of suspicious masses.  2. There is no evidence of the small or large bowel obstruction. There is a moderate amount of gas within the transverse and descending portions of the colon without evidence of obstruction.  3. There is no evidence of urinary tract obstruction. There is distention of the urinary bladder however. Correlation as to the functioning of the foley catheter is needed.   Electronically Signed   By: David  Martinique   On: 12/09/2012 14:09    Labs:  Basic Metabolic Panel:  Recent Labs Lab 12/09/12 1105 12/10/12 0330 12/11/12 0349  NA 144 139 142  K 3.3* 2.7* 4.3  CL 110 104 111  CO2 24 25 25   GLUCOSE  114* 111* 104*  BUN 26* 16 11  CREATININE 0.74 0.62 0.68  CALCIUM 8.9 8.8 9.0   GFR Estimated Creatinine Clearance:  28.3 ml/min (by C-G formula based on Cr of 0.68). Liver Function Tests:  Recent Labs Lab 12/09/12 1105 12/10/12 0330  AST 29 34  ALT 23 27  ALKPHOS 76 74  BILITOT 0.3 0.4  PROT 5.8* 5.5*  ALBUMIN 3.7 3.4*    Recent Labs Lab 12/09/12 1105  LIPASE 28   CBC:  Recent Labs Lab 12/09/12 1105 12/10/12 0330 12/11/12 0349  WBC 7.4 9.1 7.7  NEUTROABS 5.7  --   --   HGB 12.4 12.9 12.4  HCT 36.8 37.4 36.9  MCV 89.3 88.8 88.5  PLT 177 169 173   Cardiac Enzymes:  Recent Labs Lab 12/09/12 1105  TROPONINI <0.30    Time coordinating discharge: 35 minutes.  Signed:  RAMA,CHRISTINA  Pager [phone] [name] 12/14/2012, 11:30 AM

## 2012-12-14 NOTE — Progress Notes (Signed)
NCM met with pt and pt's dtr, [name]. Pt gave permission to speak to [place]. [name] states her choice was for [facility]. Pt refused SNF-[facility]. Plan to dc home to follow. Dtr states she will contact [facility] in am to arrange placement. Dtr refused [facility] at this time. States she feels HH will not be adequate for her mother needs. Jonnie Finner RN CCM Case Mgmt phone [phone]

## 2012-12-14 NOTE — Progress Notes (Signed)
Per RN, upon PTAR's arrival, Pt refused SNF and wants to go home.  Notified facility.  Bernita Raisin, [facility] [phone]

## 2012-12-14 NOTE — Progress Notes (Signed)
Transportation arrived to take pt. To [facility]. Pt. and daughter refused. Dr. Rockne Menghini notified and SW notified of refusal.

## 2012-12-14 NOTE — Progress Notes (Signed)
Discharge teaching completed with pt. and daughter. Prescription for vicodin given. Pt. Daughter refused [facility].  Pt. Daughter refused [facility] to take pt. Down in wheelchair. Daughter took downstairs herself. Left via WC with Daughter, no respiratory distress noted.

## 2012-12-14 NOTE — Progress Notes (Signed)
[name] PROGRESS NOTE  Taylor Roth [id number] DOB: 1928/02/22 DOA: 12/09/2012 PCP: Taylor Reichmann, MD  Brief narrative: Taylor Roth is an 79 y.o. female with a PMH of COPD, major depression, anxiety, prior substance abuse, tobacco abuse who was admitted on 12/09/2012 with intractable nausea and vomiting with weakness and failure to thrive.  D/C'd to [facility] 12/12/12 but daughter refused D/C after touring facility.  Assessment/Plan: Principal Problem:   Probable gastroenteritis intractable nausea and vomiting / dehydration / orthostatic hypotension The patient was noted to have an elevated BUN-creatinine ratio noted on admission, secondary to dehydration in the setting of intractable nausea and vomiting. She was also found to be orthostatic. Her symptoms and laboratory values improved with IV fluids, and she is now stable for discharge, but her family has not decided on a SNF.  Active Problems:   Constipation Patient complains of chronic constipation for which she normally takes Dulcolax. She was given a Fleet enema 10/31 and put on Dulcolax Q HS. Her bowels moved 12/12/2012.   Arterial tortuosity of aorta / cerebrovascular disease Aortic tortuosity noted on admission CT. Head CT revealed small vessel disease. Troponin negative on admission. Patient was placed on aspirin therapy.   Stage IV CKD The patient was noted to have significant reduction in her GFR. Neither she or her daughter were aware of her chronic kidney disease. It was fully explained to her that in order to preserve her remaining kidney function, she needed to avoid NSAIDs and other nephrotoxins. Vicoprofen was subsequently discontinued after ascertaining that her allergy to Vicodin was related to overuse with subsequent development of jaundice.   Hypokalemia Felt to be from GI losses. Her potassium corrected with supplementation.   History of major depression/anxiety Patient daughter reports that she has a  history of bipolar disorder and has been noncompliant with medication to treat this. She requested a formal psychiatry evaluation.  Dr. Aneta Roth saw her 12/13/12 and recommended outpatient F/U with a psychiatrist for medication management.  No medication changes advised.  Has scheduled F/U at [facility] on 12/29/12.   COPD (chronic obstructive pulmonary disease) The patient has been refusing Combivent and Flovent. Continue Xopenex as needed.   GERD (gastroesophageal reflux disease) Reflux symptoms controlled on Pepcid.   Macular degeneration Continue Travatan and Acular.   Failure to thrive Physical therapy following with plans to discharge to a rehabilitation facility.   Protein-calorie malnutrition, severe, underweight Seen by dietitian 12/10/2012. Appears frail and is underweight. Continue Ensure supplements.  Code Status: DNR Family Communication: Daughter updated at bedside 12/12/12.  Taylor Roth [id number]). Attempted to call second daughter 12/13/2012, but no answer at number provided. Left message with instructions on how to reach me. Disposition Plan: SNF.  Medical Consultants:  Dr. Drue Roth, Psychiatry  Other Consultants:  Physical therapy.  Anti-infectives:  None.  HPI/Subjective: Taylor Roth feels weak this morning. No complaints of nausea or vomiting. Her appetite remains pretty good. Back pain currently being managed well with Vicodin.  Objective: Filed Vitals:   12/13/12 0700 12/13/12 1415 12/13/12 2117 12/14/12 0510  BP: 122/57 126/67 109/83 114/58  Pulse: 66 75 70 62  Temp: 98.2 F (36.8 C) 98 F (36.7 C) 97.5 F (36.4 C) 98 F (36.7 C)  TempSrc: Oral Oral Oral Oral  Resp: 16 16 16 16   Height:      Weight:      SpO2: 97% 97% 100% 100%    Intake/Output Summary (Last 24 hours) at 12/14/12 [id number] Last data filed  at 12/13/12 1500  Gross per 24 hour  Intake    480 ml  Output      0 ml  Net    480 ml    Exam: Gen:  Frail,  cachectic-appearing Cardiovascular:  RRR, No M/R/G Respiratory:  Lungs diminished Gastrointestinal:  Abdomen soft, tender left lower quadrant, + BS Extremities:  No C/E/C  Data Reviewed: Basic Metabolic Panel:  Recent Labs Lab 12/09/12 1105 12/10/12 0330 12/11/12 0349  NA 144 139 142  K 3.3* 2.7* 4.3  CL 110 104 111  CO2 24 25 25   GLUCOSE 114* 111* 104*  BUN 26* 16 11  CREATININE 0.74 0.62 0.68  CALCIUM 8.9 8.8 9.0   GFR Estimated Creatinine Clearance: 28.3 ml/min (by C-G formula based on Cr of 0.68). Liver Function Tests:  Recent Labs Lab 12/09/12 1105 12/10/12 0330  AST 29 34  ALT 23 27  ALKPHOS 76 74  BILITOT 0.3 0.4  PROT 5.8* 5.5*  ALBUMIN 3.7 3.4*    Recent Labs Lab 12/09/12 1105  LIPASE 28   CBC:  Recent Labs Lab 12/09/12 1105 12/10/12 0330 12/11/12 0349  WBC 7.4 9.1 7.7  NEUTROABS 5.7  --   --   HGB 12.4 12.9 12.4  HCT 36.8 37.4 36.9  MCV 89.3 88.8 88.5  PLT 177 169 173   Cardiac Enzymes:  Recent Labs Lab 12/09/12 1105  TROPONINI <0.30    Procedures and Diagnostic Studies: Dg Chest 2 View  12/09/2012   CLINICAL DATA:  FAILURE TO THRIVE NAUSEA EMESIS, COPD, smoker  EXAM: CHEST  2 VIEW  COMPARISON:  09/23/2011  FINDINGS: Lungs are hyperinflated. Sigmoid scoliosis noted. Normal cardiac silhouette. No effusion, infiltrate, or pneumothorax.  IMPRESSION: Hyperinflated lungs without acute findings.   Electronically Signed   By: Suzy Bouchard M.D.   On: 12/09/2012 10:36   Ct Head Wo Contrast  12/09/2012   CLINICAL DATA:  Nausea, vomiting, failure to thrive.  EXAM: CT HEAD WITHOUT CONTRAST  TECHNIQUE: Contiguous axial images were obtained from the base of the skull through the vertex without intravenous contrast.  COMPARISON:  MRI 03/08/2009  FINDINGS: There is atrophy and chronic small vessel disease changes. No acute intracranial abnormality. Specifically, no hemorrhage, hydrocephalus, mass lesion, acute infarction, or significant  intracranial injury. No acute calvarial abnormality. Visualized paranasal sinuses and mastoids clear. Orbital soft tissues unremarkable.  IMPRESSION: No acute intracranial abnormality.  Atrophy, chronic microvascular disease.   Electronically Signed   By: Rolm Baptise M.D.   On: 12/09/2012 10:51   Ct Abdomen Pelvis W Contrast  12/09/2012   CLINICAL DATA:  Two days of nausea and vomiting and dysphagia. There is history of COPD and reflux disease.  EXAM: CT ABDOMEN AND PELVIS WITH CONTRAST  TECHNIQUE: Multidetector CT imaging of the abdomen and pelvis was performed using the standard protocol following bolus administration of intravenous contrast. The patient also received oral contrast material.  CONTRAST:  66mL OMNIPAQUE IOHEXOL 300 MG/ML SOLN and oral contrast material  COMPARISON:  None.  FINDINGS: The liver exhibits no focal mass. There is mild intrahepatic ductal dilation consistent with previous cholecystectomy. Surgical clips are present in the gallbladder fossa. The common bile duct and pancreas exhibit no acute abnormalities. The spleen is not enlarged. The stomach is moderately distended with gas and fluid. The duodenum is not abnormally distended. The partially contrast filled loops of small bowel appear normal. There is some gas within proximal small bowel loops. There is fluid and gas within the colon. There  does not appear to be colonic obstruction or acute inflammation.  There are no adrenal masses. The kidneys enhance well with no evidence of stones or obstruction. The caliber of the abdominal aorta is normal but its course is tortuous. There is no evidence of a psoas abscess. The urinary bladder is quite distended despite the presence of the Foley catheter. The rectosigmoid colon appears normal. There is no free fluid in the pelvis. The uterus is surgically absent. There are no adnexal masses. On delayed images contrast within the renal collecting systems is normal.  There is generalized wasting  of the subcutaneous fat consistent with the patient's failure to thrive.  At lung window settings there is basilar atelectasis posteriorly on the left. The patient has known lumbar levoscoliosis with significant disc space narrowing at multiple levels.  IMPRESSION: 1. There is mild intrahepatic ductal dilation consistent with previous cholecystectomy. There is no hepatosplenomegaly nor evidence of masses within these organs. The pancreas is atrophic without evidence of suspicious masses.  2. There is no evidence of the small or large bowel obstruction. There is a moderate amount of gas within the transverse and descending portions of the colon without evidence of obstruction.  3. There is no evidence of urinary tract obstruction. There is distention of the urinary bladder however. Correlation as to the functioning of the foley catheter is needed.   Electronically Signed   By: David  Martinique   On: 12/09/2012 14:09    Scheduled Meds: . aspirin  81 mg Oral Daily  . bisacodyl  5 mg Oral QHS  . clorazepate  7.5 mg Oral BID  . dorzolamide-timolol  1 drop Both Eyes BID  . enoxaparin (LOVENOX) injection  30 mg Subcutaneous Q24H  . famotidine  20 mg Oral Daily  . hydrocortisone   Rectal BID  . mirtazapine  45 mg Oral QHS  . Travoprost (BAK Free)  1 drop Both Eyes QHS   Continuous Infusions:    Time spent: 25 minutes.   LOS: 5 days   [facility] Pager [phone].   *Please note that the hospitalists switch teams on Wednesdays. Please call the flow manager at [phone] if you are having difficulty reaching the hospitalist taking care of this patient as she can update you and provide the most up-to-date pager number of provider caring for the patient. If 8PM-8AM, please contact night-coverage at [email or web address], password [email or web address]  12/14/2012, 7:22 AM

## 2012-12-15 ENCOUNTER — Other Ambulatory Visit: Payer: Self-pay | Admitting: *Deleted

## 2012-12-15 MED ORDER — CLORAZEPATE DIPOTASSIUM 7.5 MG PO TABS: ORAL_TABLET | ORAL | Status: DC

## 2012-12-15 MED ORDER — HYDROCODONE-ACETAMINOPHEN 5-325 MG PO TABS: 1.0000 | ORAL_TABLET | ORAL | Status: DC | PRN

## 2012-12-15 NOTE — Telephone Encounter (Signed)
Patient and let her now I did advise her daughter that if she was admitted to a care facility I would sign her FiO2 forms. I cannot be her doctor at the facility but I could sign the forms to get her admitted.. I will not be writing any pain medication prescriptions or nerve medications for her.

## 2012-12-15 NOTE — Telephone Encounter (Signed)
Pt called to talk to Dr Everlene Farrier. She reported that she was [facility] from [facility] last night but is trying to get into [facility]. I asked her if this is a rehab/nursing facility and she verified this. I asked her if [name] is trying to get her in there and she stated that she thinks that [name] is. Advised pt that I don't believe Dr Everlene Farrier can do anything to facilitate this, that I think that daughter would have to arrange it and pt voiced understanding. I advised her that I will let Dr Everlene Farrier know her status. She stated that she is home if Dr Everlene Farrier wants to call her.

## 2012-12-16 ENCOUNTER — Ambulatory Visit: Payer: Medicare Other | Admitting: Emergency Medicine

## 2012-12-16 NOTE — Telephone Encounter (Signed)
Tried to call pt, no ans, no VM.

## 2012-12-18 ENCOUNTER — Other Ambulatory Visit: Payer: Self-pay

## 2012-12-19 NOTE — Telephone Encounter (Signed)
Tried to call pt again and no ans/noVM. Since pt's daughter already has information and pt is not expecting a call back unless Dr Everlene Farrier had any new advice/instr's, I will close encounter and let pt CB if she needs anything further.

## 2012-12-23 ENCOUNTER — Other Ambulatory Visit: Payer: Self-pay | Admitting: *Deleted

## 2012-12-23 ENCOUNTER — Encounter: Payer: Self-pay | Admitting: Internal Medicine

## 2012-12-23 ENCOUNTER — Non-Acute Institutional Stay (SKILLED_NURSING_FACILITY): Payer: Medicare Other | Admitting: Internal Medicine

## 2012-12-23 DIAGNOSIS — K219 Gastro-esophageal reflux disease without esophagitis: Secondary | ICD-10-CM

## 2012-12-23 DIAGNOSIS — E43 Unspecified severe protein-calorie malnutrition: Secondary | ICD-10-CM

## 2012-12-23 DIAGNOSIS — R1115 Cyclical vomiting syndrome unrelated to migraine: Secondary | ICD-10-CM

## 2012-12-23 DIAGNOSIS — F329 Major depressive disorder, single episode, unspecified: Secondary | ICD-10-CM

## 2012-12-23 DIAGNOSIS — H3530 Unspecified macular degeneration: Secondary | ICD-10-CM

## 2012-12-23 DIAGNOSIS — G4700 Insomnia, unspecified: Secondary | ICD-10-CM

## 2012-12-23 DIAGNOSIS — R112 Nausea with vomiting, unspecified: Secondary | ICD-10-CM

## 2012-12-23 DIAGNOSIS — N184 Chronic kidney disease, stage 4 (severe): Secondary | ICD-10-CM

## 2012-12-23 DIAGNOSIS — Z765 Malingerer [conscious simulation]: Secondary | ICD-10-CM

## 2012-12-23 DIAGNOSIS — I951 Orthostatic hypotension: Secondary | ICD-10-CM

## 2012-12-23 DIAGNOSIS — J449 Chronic obstructive pulmonary disease, unspecified: Secondary | ICD-10-CM

## 2012-12-23 DIAGNOSIS — F1910 Other psychoactive substance abuse, uncomplicated: Secondary | ICD-10-CM

## 2012-12-23 DIAGNOSIS — R627 Adult failure to thrive: Secondary | ICD-10-CM

## 2012-12-23 DIAGNOSIS — IMO0002 Reserved for concepts with insufficient information to code with codable children: Secondary | ICD-10-CM

## 2012-12-23 MED ORDER — HYDROCODONE-ACETAMINOPHEN 5-325 MG PO TABS: 1.0000 | ORAL_TABLET | ORAL | Status: DC | PRN

## 2012-12-23 MED ORDER — TRIAZOLAM 0.25 MG PO TABS: ORAL_TABLET | ORAL | Status: DC

## 2012-12-23 MED ORDER — CLORAZEPATE DIPOTASSIUM 7.5 MG PO TABS: ORAL_TABLET | ORAL | Status: DC

## 2012-12-23 NOTE — Assessment & Plan Note (Signed)
Pt asking about pain meds and anxiety meds on initial interview; she already has both from hospital d/c;maybe when Bipolar gets controlled this behavoir will change

## 2012-12-23 NOTE — Assessment & Plan Note (Signed)
Pt has been controlled on non PPI;pt on zantac

## 2012-12-23 NOTE — Assessment & Plan Note (Signed)
New diagnosis, unknown to pt and family; avoid NSAIDS and other nephrotoxins Creatinine clearance 28.3 - even though BUN 11/Cr 0.68

## 2012-12-23 NOTE — Assessment & Plan Note (Signed)
Was felt at hospital to be from vomiting but pt's BP here is running low and she has fallen twice-not seen, no injuries-; I don't see pt being able to be compliant with more conservative measures;will start fludrocortisone 0.1 mg qam and 1 gm salt pill with each meal

## 2012-12-23 NOTE — Assessment & Plan Note (Signed)
Pt likes ensure over ice in am and qHS and will offer snacks q 2 hours but pt is picky

## 2012-12-23 NOTE — Assessment & Plan Note (Signed)
One reason pt in SNF and PT/OT

## 2012-12-23 NOTE — Assessment & Plan Note (Signed)
Will continue Halcion

## 2012-12-23 NOTE — Assessment & Plan Note (Signed)
Was admitted and improved;BUN/Cr took a hit but that improved as well prior to d/c

## 2012-12-23 NOTE — Progress Notes (Signed)
MRN: [id number] Name: TAMIR SUSKI  Sex: female Age: 79 y.o. DOB: 1928-07-09  [facility] #: [facility] Facility/Room: 218 Level Of Care: SNF Provider: Inocencio Homes D Emergency Contacts: Extended Emergency Contact Information Primary Emergency Contact: [name],[name] Address: [address] [place], [place] Phone: [phone] Work Phone: [phone] Relation: Daughter Secondary Emergency Contact: [place] of [place] Phone: [phone] Mobile Phone: [phone] Relation: Daughter  Code Status:   Allergies: Macrobid; Azithromycin; Doxycycline; Escitalopram oxalate; Fioricet; Latex; Levofloxacin; Oxycodone; Restasis; Sulfa antibiotics; Tylenol; and Biaxin  Chief Complaint  Patient presents with  . nursing home admission    HPI: Patient is 79 y.o. female who is very frail, malnurished, deconditioned and has untreated Bipolar dx.  Past Medical History  Diagnosis Date  . Osteoporosis   . Depression   . Anxiety   . COPD (chronic obstructive pulmonary disease)   . GERD (gastroesophageal reflux disease)   . Abuse     benzos/narcotics  . Migraines   . Scoliosis   . Insomnia, unspecified   . Vitamin B deficiency   . Weight loss   . Chronic mental illness   . Arterial tortuosity (aorta) 12/11/2012  . Chronic kidney disease, stage IV (severe) 12/11/2012    Past Surgical History  Procedure Laterality Date  . Cholecystectomy    . Partial hysterectomy    . Abdominal hysterectomy        Medication List       This list is accurate as of: 12/23/12  9:56 PM.  Always use your most recent med list.               aspirin 81 MG chewable tablet  Chew 1 tablet (81 mg total) by mouth daily.     augmented betamethasone dipropionate 0.05 % cream  Commonly known as:  DIPROLENE-AF  Apply topically 2 (two) times daily.     bisacodyl 5 MG EC tablet  Commonly known as:  DULCOLAX  Take 1 tablet (5 mg  total) by mouth at bedtime.     clorazepate 7.5 MG tablet  Commonly known as:  TRANXENE  TAKE 1 TABLET BY MOUTH THREE   TIMES DAILY     diphenhydrAMINE 50 MG capsule  Commonly known as:  BENADRYL  TAKE 1 CAPSULE BY MOUTH THREE TIMES DAILY     dorzolamide-timolol 22.3-6.8 MG/ML ophthalmic solution  Commonly known as:  COSOPT  Place 1 drop into both eyes 2 (two) times daily.     ENSURE COMPLETE SHAKE Liqd  Take 1 Bottle by mouth 2 (two) times daily.     fluticasone 110 MCG/ACT inhaler  Commonly known as:  FLOVENT HFA  Inhale 1 puff into the lungs 2 (two) times daily.     HYDROcodone-acetaminophen 5-325 MG per tablet  Commonly known as:  NORCO/VICODIN  Take 1-2 tablets by mouth every 4 (four) hours as needed.     HYDROcodone-acetaminophen 5-325 MG per tablet  Commonly known as:  NORCO/VICODIN  Take 1 tablet by mouth every 4 (four) hours as needed.     hydrocortisone 2.5 % rectal cream  Commonly known as:  PROCTOSOL HC  apply twice a day to affected area     hydrOXYzine 25 MG tablet  Commonly known as:  ATARAX/VISTARIL  Take 1 tablet (25 mg total) by mouth 3 (three) times daily as needed.     Ipratropium-Albuterol 20-100 MCG/ACT Aers respimat  Commonly known as:  COMBIVENT  Inhale 1 puff into the lungs 3 (three) times daily.     ketorolac 0.5 % ophthalmic solution  Commonly known as:  ACULAR  Place 1 drop into both eyes daily. One drop in her eyes once a day     levalbuterol 45 MCG/ACT inhaler  Commonly known as:  XOPENEX HFA  Inhale 2 puffs every 4-6 hours as needed     nitroGLYCERIN 0.4 MG SL tablet  Commonly known as:  NITROSTAT  Place 0.4 mg under the tongue every 5 (five) minutes as needed.     ondansetron 4 MG tablet  Commonly known as:  ZOFRAN  Take 1 tablet (4 mg total) by mouth every 6 (six) hours as needed for nausea.     REMERON SOLTAB 45 MG disintegrating tablet  Generic drug:  mirtazapine  Dissolve 1 tablet on tongue with or without water at  bedtime.     SYSTANE OP  Apply 1 drop to eye as needed (for dry eyes).     travoprost (benzalkonium) 0.004 % ophthalmic solution  Commonly known as:  TRAVATAN  Place 1 drop into both eyes at bedtime.     triazolam 0.25 MG tablet  Commonly known as:  HALCION  TAKE TWO   TABLETS BY MOUTH DAILY AT BEDTIME     ZANTAC 150 MG tablet  Generic drug:  ranitidine  TAKE 1 TABLET (150 MG TOTAL) BY MOUTH TWO   (TWO) TIMES DAILY.        No orders of the defined types were placed in this encounter.    Immunization History  Administered Date(s) Administered  . Influenza Split 11/13/2011  . Influenza-Generic 02/13/2008  . Pneumococcal-Generic 02/13/2008  . Td 02/12/2009    History  Substance Use Topics  . Smoking status: Current Every Day Smoker -- 0.50 packs/day    Types: Cigarettes  . Smokeless tobacco: Never Used  . Alcohol Use: No    Family history is noncontributory    Review of Systems  DATA OBTAINED: from patient, family member GENERAL: Feels well no fevers, fatigue, poor appetite, picky SKIN: No itching, rash or wounds EYES: No eye pain, redness, discharge EARS: No earache, tinnitus, change in hearing NOSE: No congestion, drainage or bleeding  MOUTH/THROAT: No mouth or tooth pain, No sore throat, No difficulty chewing or swallowing  RESPIRATORY: No cough, wheezing, SOB CARDIAC: No chest pain, palpitations, lower extremity edema  GI: No abdominal pain, No N/V/D or constipation, No heartburn or reflux  GU: No dysuria, frequency or urgency, or incontinence  MUSCULOSKELETAL: No unrelieved bone/joint pain NEUROLOGIC: No headache, dizziness or focal weakness PSYCHIATRIC: concerned about what she has to take for anxiety and pain  Filed Vitals:   12/23/12 1233  BP: 89/56  Pulse: 63  Temp: 98.4 F (36.9 C)  Resp: 18    Physical Exam  GENERAL APPEARANCE: Alert, conversant. Appropriately groomed. No acute distress.Very thin  SKIN: No diaphoresis rash, or  wounds HEAD: Normocephalic, atraumatic  EYES: Conjunctiva/lids clear. Pupils round, reactive. EOMs intact.  EARS: External exam WNL, canals clear. Hearing grossly normal.  NOSE: No deformity or discharge.  MOUTH/THROAT: Lips w/o lesions.  RESPIRATORY: Breathing is even, unlabored. Lung sounds are clear   CARDIOVASCULAR: Heart RRR no murmurs, rubs or gallops. No peripheral edema  VENOUS: No varicosities. No venous stasis skin changes  GASTROINTESTINAL: Abdomen is soft, non-tender, not distended w/ normal bowel sounds. No mass, ventral or inguinal hernia. No organomegally GENITOURINARY: Bladder non tender, not distended  MUSCULOSKELETAL: No abnormal joints or musculature  NEUROLOGIC: Oriented X3. Cranial nerves 2-12 grossly intact. Moves all extremities no tremor. PSYCHIATRIC: slt odd affect, no behavioral issues  Patient Active Problem List   Diagnosis Date Noted  . Unspecified constipation 12/12/2012  . Hypokalemia 12/11/2012  . Orthostatic hypotension 12/11/2012  . Chronic kidney disease, stage IV (severe) 12/11/2012  . Arterial tortuosity (aorta) 12/11/2012  . Protein-calorie malnutrition, severe 12/10/2012  . Failure to thrive 12/09/2012  . Probable gastroenteritis with intractable nausea and vomiting 12/09/2012  . Decreased rectal sphincter tone 11/21/2012  . Idiopathic scoliosis 07/08/2012  . Macular degeneration 06/26/2011  . Vision disturbance 06/26/2011  . Glaucoma 06/26/2011  . Anemia 04/17/2011  . Drug-seeking behavior 02/26/2011  . Osteoporosis 02/26/2011  . Depression   . Anxiety   . COPD (chronic obstructive pulmonary disease)   . GERD (gastroesophageal reflux disease)   . Migraines   . Insomnia, unspecified     CBC    Component Value Date/Time   WBC 7.7 12/11/2012 0349   WBC 6.9 08/05/2012 1413   RBC 4.17 12/11/2012 0349   RBC 4.20 08/05/2012 1413   HGB 12.4 12/11/2012 0349   HGB 12.3 08/05/2012 1413   HCT 36.9 12/11/2012 0349   HCT 39.3 08/05/2012 1413    PLT 173 12/11/2012 0349   MCV 88.5 12/11/2012 0349   MCV 93.6 08/05/2012 1413   LYMPHSABS 1.2 12/09/2012 1105   MONOABS 0.5 12/09/2012 1105   EOSABS 0.0 12/09/2012 1105   BASOSABS 0.0 12/09/2012 1105    CMP     Component Value Date/Time   NA 142 12/11/2012 0349   K 4.3 12/11/2012 0349   CL 111 12/11/2012 0349   CO2 25 12/11/2012 0349   GLUCOSE 104* 12/11/2012 0349   BUN 11 12/11/2012 0349   CREATININE 0.68 12/11/2012 0349   CREATININE 0.96 08/05/2012 1403   CALCIUM 9.0 12/11/2012 0349   PROT 5.5* 12/10/2012 0330   ALBUMIN 3.4* 12/10/2012 0330   AST 34 12/10/2012 0330   ALT 27 12/10/2012 0330   ALKPHOS 74 12/10/2012 0330   BILITOT 0.4 12/10/2012 0330   GFRNONAA 79* 12/11/2012 0349   GFRAA >90 12/11/2012 0349    Assessment and Plan  Probable gastroenteritis with intractable nausea and vomiting Was admitted and improved;BUN/Cr took a hit but that improved as well prior to d/c  Chronic kidney disease, stage IV (severe) New diagnosis, unknown to pt and family; avoid NSAIDS and other nephrotoxins Creatinine clearance 28.3 - even though BUN 11/Cr 0.68  Depression H/O Bipolar dx and non compliance with meds;daughter requested Psych eval in hosp;pt has appt Monday 11/17 with [facility] for medication management; no med changes advised pending Psych f/u  COPD (chronic obstructive pulmonary disease) Pt has h/o not using combivent or flovent but will use Xopenex;pt seems to be stable at this time  GERD (gastroesophageal reflux disease) Pt has been controlled on non PPI;pt on zantac  Macular degeneration On 2 drops   Failure to thrive One reason pt in SNF and PT/OT  Protein-calorie malnutrition, severe Pt likes ensure over ice in am and qHS and will offer snacks q 2 hours but pt is picky  Insomnia, unspecified Will continue Halcion  Drug-seeking behavior Pt asking about pain meds and anxiety meds on initial interview; she already has both from hospital d/c;maybe when  Bipolar gets controlled this behavoir will change  Orthostatic hypotension Was felt at hospital to be from vomiting but pt's BP here is running low and she has fallen twice-not seen, no injuries-; I don't see  pt being able to be compliant with more conservative measures;will start fludrocortisone 0.1 mg qam and 1 gm salt pill with each meal    [name], MD

## 2012-12-23 NOTE — Assessment & Plan Note (Signed)
H/O Bipolar dx and non compliance with meds;daughter requested Psych eval in hosp;pt has appt Monday 11/17 with [facility] for medication management; no med changes advised pending Psych f/u

## 2012-12-23 NOTE — Assessment & Plan Note (Signed)
On 2 drops

## 2012-12-23 NOTE — Assessment & Plan Note (Signed)
Pt has h/o not using combivent or flovent but will use Xopenex;pt seems to be stable at this time

## 2013-01-09 ENCOUNTER — Non-Acute Institutional Stay (SKILLED_NURSING_FACILITY): Payer: Medicare Other | Admitting: Internal Medicine

## 2013-01-09 DIAGNOSIS — R21 Rash and other nonspecific skin eruption: Secondary | ICD-10-CM

## 2013-01-09 DIAGNOSIS — I951 Orthostatic hypotension: Secondary | ICD-10-CM

## 2013-01-10 ENCOUNTER — Encounter: Payer: Self-pay | Admitting: Internal Medicine

## 2013-01-10 NOTE — Assessment & Plan Note (Signed)
Follow up to 11/11 - pt's BP is improved, no falls have been brought to my attention; pt is on sodium pills;will check BMP

## 2013-01-10 NOTE — Progress Notes (Signed)
MRN: [id number] Name: Taylor Roth  Sex: female Age: 79 y.o. DOB: 1928-04-07  [place] #: [facility] Facility/Room: 218 Level Of Care: SNF Provider: Inocencio Homes D Emergency Contacts: Extended Emergency Contact Information Primary Emergency Contact: [name],[name] Address: [place], [place] Phone: [phone] Work Phone: [phone] Mobile Phone: [phone] Relation: Daughter Secondary Emergency Contact: [name] Phone: [phone] Mobile Phone: [phone] Relation: Daughter  Code Status:   Allergies: Macrobid; Azithromycin; Doxycycline; Escitalopram oxalate; Fioricet; Latex; Levofloxacin; Oxycodone; Restasis; Sulfa antibiotics; Tylenol; and Biaxin  Chief Complaint  Patient presents with  . Acute Visit    HPI: Patient is 79 y.o. female who the nurses have asked me to see because she is c/o rash and swelling of legs for several days.  Past Medical History  Diagnosis Date  . Osteoporosis   . Depression   . Anxiety   . COPD (chronic obstructive pulmonary disease)   . GERD (gastroesophageal reflux disease)   . Abuse     benzos/narcotics  . Migraines   . Scoliosis   . Insomnia, unspecified   . Vitamin B deficiency   . Weight loss   . Chronic mental illness   . Arterial tortuosity (aorta) 12/11/2012  . Chronic kidney disease, stage IV (severe) 12/11/2012    Past Surgical History  Procedure Laterality Date  . Cholecystectomy    . Partial hysterectomy    . Abdominal hysterectomy        Medication List       This list is accurate as of: 01/09/13 11:59 PM.  Always use your most recent med list.               aspirin 81 MG chewable tablet  Chew 1 tablet (81 mg total) by mouth daily.     augmented betamethasone dipropionate 0.05 % cream  Commonly known as:  DIPROLENE-AF  Apply topically 2 (two) times daily.     bisacodyl 5 MG EC tablet  Commonly known  as:  DULCOLAX  Take 1 tablet (5 mg total) by mouth at bedtime.     clorazepate 7.5 MG tablet  Commonly known as:  TRANXENE  TAKE 1 TABLET BY MOUTH THREE   TIMES DAILY     diphenhydrAMINE 50 MG capsule  Commonly known as:  BENADRYL  TAKE 1 CAPSULE BY MOUTH THREE TIMES DAILY     dorzolamide-timolol 22.3-6.8 MG/ML ophthalmic solution  Commonly known as:  COSOPT  Place 1 drop into both eyes 2 (two) times daily.     ENSURE COMPLETE SHAKE Liqd  Take 1 Bottle by mouth 2 (two) times daily.     fluticasone 110 MCG/ACT inhaler  Commonly known as:  FLOVENT HFA  Inhale 1 puff into the lungs 2 (two) times daily.     HYDROcodone-acetaminophen 5-325 MG per tablet  Commonly known as:  NORCO/VICODIN  Take 1-2 tablets by mouth every 4 (four) hours as needed.     HYDROcodone-acetaminophen 5-325 MG per tablet  Commonly known as:  NORCO/VICODIN  Take 1 tablet by mouth every 4 (four) hours as needed.     hydrocortisone 2.5 % rectal cream  Commonly known as:  PROCTOSOL HC  apply twice a day to affected area     hydrOXYzine 25 MG tablet  Commonly known as:  ATARAX/VISTARIL  Take 1 tablet (25 mg total) by mouth 3 (three) times daily as needed.     Ipratropium-Albuterol  20-100 MCG/ACT Aers respimat  Commonly known as:  COMBIVENT  Inhale 1 puff into the lungs 3 (three) times daily.     ketorolac 0.5 % ophthalmic solution  Commonly known as:  ACULAR  Place 1 drop into both eyes daily. One drop in her eyes once a day     levalbuterol 45 MCG/ACT inhaler  Commonly known as:  XOPENEX HFA  Inhale 2 puffs every 4-6 hours as needed     nitroGLYCERIN 0.4 MG SL tablet  Commonly known as:  NITROSTAT  Place 0.4 mg under the tongue every 5 (five) minutes as needed.     ondansetron 4 MG tablet  Commonly known as:  ZOFRAN  Take 1 tablet (4 mg total) by mouth every 6 (six) hours as needed for nausea.     REMERON SOLTAB 45 MG disintegrating tablet  Generic drug:  mirtazapine  Dissolve 1 tablet on  tongue with or without water at bedtime.     SYSTANE OP  Apply 1 drop to eye as needed (for dry eyes).     travoprost (benzalkonium) 0.004 % ophthalmic solution  Commonly known as:  TRAVATAN  Place 1 drop into both eyes at bedtime.     triazolam 0.25 MG tablet  Commonly known as:  HALCION  TAKE TWO   TABLETS BY MOUTH DAILY AT BEDTIME     ZANTAC 150 MG tablet  Generic drug:  ranitidine  TAKE 1 TABLET (150 MG TOTAL) BY MOUTH TWO   (TWO) TIMES DAILY.        No orders of the defined types were placed in this encounter.    Immunization History  Administered Date(s) Administered  . Influenza Split 11/13/2011  . Influenza-Unspecified 02/13/2008  . Pneumococcal-Unspecified 02/13/2008  . Td 02/12/2009    History  Substance Use Topics  . Smoking status: Current Every Day Smoker -- 0.50 packs/day    Types: Cigarettes  . Smokeless tobacco: Never Used  . Alcohol Use: No    Review of Systems  DATA OBTAINED: from patient, nurse GENERAL: Feels well no fevers, fatigue, appetite changes SKIN: rash on both legs that is itchy;no new exposures HEENT: No complaint RESPIRATORY: No cough, wheezing, SOB CARDIAC: No chest pain, palpitations, lower extremity edema is better today GI: No abdominal pain, No N/V/D or constipation, No heartburn or reflux  GU: No dysuria, frequency or urgency, or incontinence  MUSCULOSKELETAL: No unrelieved bone/joint pain NEUROLOGIC: No headache, dizziness or focal weakness PSYCHIATRIC: No overt anxiety or sadness. Sleeps well.   Filed Vitals:   01/06/13 2116  BP: 111/71  Pulse: 59  Temp: 97.1 F (36.2 C)  Resp: 18    Physical Exam  GENERAL APPEARANCE: Alert, conversant. Appropriately groomed. No acute distress  SKIN: few tiny pink papules scattered over legs; no particular pattern HEENT: Unremarkable RESPIRATORY: Breathing is even, unlabored. Lung sounds are clear   CARDIOVASCULAR: Heart RRR no murmurs, rubs or gallops. No leg edema;trace  pittingedema to feet GASTROINTESTINAL: Abdomen is soft, non-tender, not distended w/ normal bowel sounds.  GENITOURINARY: Bladder non tender, not distended  MUSCULOSKELETAL: No abnormal joints or musculature NEUROLOGIC: Cranial nerves 2-12 grossly intact. Moves all extremities no tremor. PSYCHIATRIC: Mood and affect appropriate to situation, no behavioral issues  Patient Active Problem List   Diagnosis Date Noted  . Unspecified constipation 12/12/2012  . Hypokalemia 12/11/2012  . Orthostatic hypotension 12/11/2012  . Chronic kidney disease, stage IV (severe) 12/11/2012  . Arterial tortuosity (aorta) 12/11/2012  . Protein-calorie malnutrition, severe 12/10/2012  . Failure  to thrive 12/09/2012  . Probable gastroenteritis with intractable nausea and vomiting 12/09/2012  . Decreased rectal sphincter tone 11/21/2012  . Idiopathic scoliosis 07/08/2012  . Macular degeneration 06/26/2011  . Vision disturbance 06/26/2011  . Glaucoma 06/26/2011  . Anemia 04/17/2011  . Drug-seeking behavior 02/26/2011  . Osteoporosis 02/26/2011  . Depression   . Anxiety   . COPD (chronic obstructive pulmonary disease)   . GERD (gastroesophageal reflux disease)   . Migraines   . Insomnia, unspecified     CBC    Component Value Date/Time   WBC 7.7 12/11/2012 0349   WBC 6.9 08/05/2012 1413   RBC 4.17 12/11/2012 0349   RBC 4.20 08/05/2012 1413   HGB 12.4 12/11/2012 0349   HGB 12.3 08/05/2012 1413   HCT 36.9 12/11/2012 0349   HCT 39.3 08/05/2012 1413   PLT 173 12/11/2012 0349   MCV 88.5 12/11/2012 0349   MCV 93.6 08/05/2012 1413   LYMPHSABS 1.2 12/09/2012 1105   MONOABS 0.5 12/09/2012 1105   EOSABS 0.0 12/09/2012 1105   BASOSABS 0.0 12/09/2012 1105    CMP     Component Value Date/Time   NA 142 12/11/2012 0349   K 4.3 12/11/2012 0349   CL 111 12/11/2012 0349   CO2 25 12/11/2012 0349   GLUCOSE 104* 12/11/2012 0349   BUN 11 12/11/2012 0349   CREATININE 0.68 12/11/2012 0349   CREATININE 0.96  08/05/2012 1403   CALCIUM 9.0 12/11/2012 0349   PROT 5.5* 12/10/2012 0330   ALBUMIN 3.4* 12/10/2012 0330   AST 34 12/10/2012 0330   ALT 27 12/10/2012 0330   ALKPHOS 74 12/10/2012 0330   BILITOT 0.4 12/10/2012 0330   GFRNONAA 79* 12/11/2012 0349   GFRAA >90 12/11/2012 0349    Assessment and Plan RASH ON LEGS- non specific without cellulitis or other definative process- Eucerin lotion BID to legs  Orthostatic hypotension Follow up to 11/11 - pt's BP is improved, no falls have been brought to my attention; pt is on sodium pills;will check BMP    [name], MD

## 2013-01-14 ENCOUNTER — Non-Acute Institutional Stay (SKILLED_NURSING_FACILITY): Payer: Medicare Other | Admitting: Internal Medicine

## 2013-01-14 DIAGNOSIS — I951 Orthostatic hypotension: Secondary | ICD-10-CM

## 2013-01-14 DIAGNOSIS — R21 Rash and other nonspecific skin eruption: Secondary | ICD-10-CM

## 2013-01-14 DIAGNOSIS — R112 Nausea with vomiting, unspecified: Secondary | ICD-10-CM

## 2013-01-14 DIAGNOSIS — N184 Chronic kidney disease, stage 4 (severe): Secondary | ICD-10-CM

## 2013-01-14 DIAGNOSIS — R1115 Cyclical vomiting syndrome unrelated to migraine: Secondary | ICD-10-CM

## 2013-01-14 NOTE — Progress Notes (Signed)
Patient ID: Taylor Roth, female   DOB: 04-24-1928, 79 y.o.   MRN: [id number]  Location:  [facility] L. Mariea Clonts, D.O., C.M.D.  PCP: Jenny Reichmann, MD  Code Status: DNR  Allergies  Allergen Reactions  . Macrobid [Nitrofurantoin Macrocrystal]     Patient developed a rash on the medication  . Azithromycin     unknown  . Doxycycline     unknown  . Escitalopram Oxalate     unknown  . Fioricet [Butalbital-Apap-Caffeine]     Drunk.  . Latex     unknown  . Levofloxacin     unknown  . Oxycodone     reaction to synthetic codeine  . Restasis [Cyclosporine]     unknown  . Sulfa Antibiotics   . Tylenol [Acetaminophen]     jaundice  . Biaxin [Clarithromycin] Rash    With burning sensation    Chief Complaint  Patient presents with  . Discharge Note    d/c to Assisted living    HPI:  79 yo female was here for short term rehab s/p hospitalization for intractable nausea and vomiting felt to be due to gastroenteritis.  She also had CKD IV which was new.  She has completed her PT, OT, and will now go to AL for further care.  She has low vision, is fall risk.  She needs setup for grooming.  She can only microwave her meals.  She can get up and down independently.  She also needs setup to bathe and dresses herself slowly.  She eats well.  She communicates easily. She hears well.    Review of Systems:  Review of Systems  Constitutional: Positive for malaise/fatigue. Negative for fever and chills.  HENT: Negative for hearing loss.   Eyes: Positive for blurred vision.       Low vision  Respiratory: Negative for shortness of breath.   Cardiovascular: Negative for chest pain.  Gastrointestinal: Negative for abdominal pain.  Genitourinary: Negative for dysuria.  Musculoskeletal: Negative for falls.  Skin: Negative for rash.  Neurological: Positive for weakness. Negative for dizziness.  Psychiatric/Behavioral: Negative for depression.     Past Medical History   Diagnosis Date  . Osteoporosis   . Depression   . Anxiety   . COPD (chronic obstructive pulmonary disease)   . GERD (gastroesophageal reflux disease)   . Abuse     benzos/narcotics  . Migraines   . Scoliosis   . Insomnia, unspecified   . Vitamin B deficiency   . Weight loss   . Chronic mental illness   . Arterial tortuosity (aorta) 12/11/2012  . Chronic kidney disease, stage IV (severe) 12/11/2012    Past Surgical History  Procedure Laterality Date  . Cholecystectomy    . Partial hysterectomy    . Abdominal hysterectomy      Social History:   reports that she has been smoking Cigarettes.  She has been smoking about 0.50 packs per day. She has never used smokeless tobacco. She reports that she does not drink alcohol or use illicit drugs.  Family History  Problem Relation Age of Onset  . Stroke Mother   . Diabetes Mother   . Cancer Father 72    esophageal    Medications: Patient's Medications  New Prescriptions   No medications on file  Previous Medications   ASPIRIN 81 MG CHEWABLE TABLET    Chew 1 tablet (81 mg total) by mouth daily.   AUGMENTED BETAMETHASONE DIPROPIONATE (DIPROLENE-AF) 0.05 % CREAM  Apply topically 2 (two) times daily.   BISACODYL (DULCOLAX) 5 MG EC TABLET    Take 1 tablet (5 mg total) by mouth at bedtime.   CLORAZEPATE (TRANXENE) 7.5 MG TABLET    TAKE 1 TABLET BY MOUTH THREE   TIMES DAILY   DIPHENHYDRAMINE (BENADRYL) 50 MG CAPSULE    TAKE 1 CAPSULE BY MOUTH THREE TIMES DAILY   DORZOLAMIDE-TIMOLOL (COSOPT) 22.3-6.8 MG/ML OPHTHALMIC SOLUTION    Place 1 drop into both eyes 2 (two) times daily.   FLUTICASONE (FLOVENT HFA) 110 MCG/ACT INHALER    Inhale 1 puff into the lungs 2 (two) times daily.   HYDROCODONE-ACETAMINOPHEN (NORCO/VICODIN) 5-325 MG PER TABLET    Take 1-2 tablets by mouth every 4 (four) hours as needed.   HYDROCODONE-ACETAMINOPHEN (NORCO/VICODIN) 5-325 MG PER TABLET    Take 1 tablet by mouth every 4 (four) hours as needed.    HYDROCORTISONE (PROCTOSOL HC) 2.5 % RECTAL CREAM    apply twice a day to affected area   HYDROXYZINE (ATARAX/VISTARIL) 25 MG TABLET    Take 1 tablet (25 mg total) by mouth 3 (three) times daily as needed.   IPRATROPIUM-ALBUTEROL (COMBIVENT) 20-100 MCG/ACT AERS RESPIMAT    Inhale 1 puff into the lungs 3 (three) times daily.   KETOROLAC (ACULAR) 0.5 % OPHTHALMIC SOLUTION    Place 1 drop into both eyes daily. One drop in her eyes once a day   LEVALBUTEROL (XOPENEX HFA) 45 MCG/ACT INHALER    Inhale 2 puffs every 4-6 hours as needed   NITROGLYCERIN (NITROSTAT) 0.4 MG SL TABLET    Place 0.4 mg under the tongue every 5 (five) minutes as needed.   NUTRITIONAL SUPPLEMENTS (ENSURE COMPLETE SHAKE) LIQD    Take 1 Bottle by mouth 2 (two) times daily.   ONDANSETRON (ZOFRAN) 4 MG TABLET    Take 1 tablet (4 mg total) by mouth every 6 (six) hours as needed for nausea.   POLYETHYL GLYCOL-PROPYL GLYCOL (SYSTANE OP)    Apply 1 drop to eye as needed (for dry eyes).   REMERON SOLTAB 45 MG DISINTEGRATING TABLET    Dissolve 1 tablet on tongue with or without water at bedtime.   TRAVOPROST, BENZALKONIUM, (TRAVATAN) 0.004 % OPHTHALMIC SOLUTION    Place 1 drop into both eyes at bedtime.   TRIAZOLAM (HALCION) 0.25 MG TABLET    TAKE TWO   TABLETS BY MOUTH DAILY AT BEDTIME   ZANTAC 150 MG TABLET    TAKE 1 TABLET (150 MG TOTAL) BY MOUTH TWO   (TWO) TIMES DAILY.  Modified Medications   No medications on file  Discontinued Medications   No medications on file    Physical Exam:  Physical Exam  Constitutional: She is oriented to person, place, and time. She appears well-developed and well-nourished. No distress.  HENT:  Head: Normocephalic and atraumatic.  Eyes:  Low vision  Cardiovascular: Normal rate, regular rhythm, normal heart sounds and intact distal pulses.   Pulmonary/Chest: Effort normal and breath sounds normal. No respiratory distress.  Abdominal: Soft. Bowel sounds are normal. She exhibits no distension and no  mass. There is no tenderness.  Musculoskeletal: Normal range of motion. She exhibits no edema and no tenderness.  Neurological: She is alert and oriented to person, place, and time.  Skin: Skin is warm and dry.  Psychiatric: She has a normal mood and affect.    Labs reviewed: Basic Metabolic Panel:  Recent Labs  12/09/12 1105 12/10/12 0330 12/11/12 0349  NA 144 139 142  K  3.3* 2.7* 4.3  CL 110 104 111  CO2 24 25 25   GLUCOSE 114* 111* 104*  BUN 26* 16 11  CREATININE 0.74 0.62 0.68  CALCIUM 8.9 8.8 9.0   Liver Function Tests:  Recent Labs  08/05/12 1403 12/09/12 1105 12/10/12 0330  AST 25 29 34  ALT 15 23 27   ALKPHOS 77 76 74  BILITOT 0.3 0.3 0.4  PROT 6.0 5.8* 5.5*  ALBUMIN 4.0 3.7 3.4*    Recent Labs  12/09/12 1105  LIPASE 28  CBC:  Recent Labs  12/09/12 1105 12/10/12 0330 12/11/12 0349  WBC 7.4 9.1 7.7  NEUTROABS 5.7  --   --   HGB 12.4 12.9 12.4  HCT 36.8 37.4 36.9  MCV 89.3 88.8 88.5  PLT 177 169 173   Cardiac Enzymes:  Recent Labs  12/09/12 1105  TROPONINI <0.30   Assessment/Plan:   1. Intractable nausea and vomiting -resolved with fluids and resolution of gastroenteritis  2. Chronic kidney disease, stage IV (severe) -had acute on chronic due to #1 -new diagnosis, needs renal f/u outpatient  3. Orthostatic hypotension -during snf stay--bp hold parameters added  4. Rash and nonspecific skin eruption -helped with betamethasone cream   Patient is being discharged with home health services:  None, going to assisted living  Patient is being discharged with the following durable medical equipment:  None, going to AL  Patient has been advised to f/u with their PCP in 1-2 weeks to bring them up to date on their rehab stay.  They were provided with a 30 day supply of scripts for prescription medications and refills must be obtained from their PCP.

## 2013-01-20 ENCOUNTER — Other Ambulatory Visit: Payer: Self-pay | Admitting: *Deleted

## 2013-01-20 MED ORDER — TRIAZOLAM 0.25 MG PO TABS: ORAL_TABLET | ORAL | Status: DC

## 2013-01-24 ENCOUNTER — Telehealth (HOSPITAL_COMMUNITY): Payer: Self-pay | Admitting: *Deleted

## 2013-01-24 NOTE — ED Notes (Signed)
Pt calling to see if I can tell the nursing home tech to give her more pain medication per her prescription.  I told her to get her daughter involved to help her get that straight at the nursing facility.

## 2013-04-10 ENCOUNTER — Encounter: Payer: Self-pay | Admitting: Internal Medicine

## 2013-06-20 ENCOUNTER — Ambulatory Visit (INDEPENDENT_AMBULATORY_CARE_PROVIDER_SITE_OTHER): Payer: Medicare Other | Admitting: Family Medicine

## 2013-06-20 VITALS — BP 140/80 | HR 67 | Temp 98.4°F | Ht <= 58 in | Wt 96.0 lb

## 2013-06-20 DIAGNOSIS — H5710 Ocular pain, unspecified eye: Secondary | ICD-10-CM

## 2013-06-20 DIAGNOSIS — R300 Dysuria: Secondary | ICD-10-CM

## 2013-06-20 DIAGNOSIS — N390 Urinary tract infection, site not specified: Secondary | ICD-10-CM

## 2013-06-20 DIAGNOSIS — N184 Chronic kidney disease, stage 4 (severe): Secondary | ICD-10-CM

## 2013-06-20 LAB — POCT CBC
Granulocyte percent: 53.2 %G (ref 37–80)
HCT, POC: 36 % — AB (ref 37.7–47.9)
Hemoglobin: 11.3 g/dL — AB (ref 12.2–16.2)
Lymph, poc: 2.3 (ref 0.6–3.4)
MCH, POC: 28.9 pg (ref 27–31.2)
MCHC: 31.4 g/dL — AB (ref 31.8–35.4)
MCV: 92.1 fL (ref 80–97)
MID (cbc): 0.6 (ref 0–0.9)
MPV: 9.8 fL (ref 0–99.8)
POC GRANULOCYTE: 3.3 (ref 2–6.9)
POC LYMPH PERCENT: 37.9 %L (ref 10–50)
POC MID %: 8.9 %M (ref 0–12)
Platelet Count, POC: 173 10*3/uL (ref 142–424)
RBC: 3.91 M/uL — AB (ref 4.04–5.48)
RDW, POC: 15.9 %
WBC: 6.2 10*3/uL (ref 4.6–10.2)

## 2013-06-20 LAB — POCT UA - MICROSCOPIC ONLY
Casts, Ur, LPF, POC: NEGATIVE
Crystals, Ur, HPF, POC: NEGATIVE
Mucus, UA: NEGATIVE
Yeast, UA: NEGATIVE

## 2013-06-20 LAB — POCT URINALYSIS DIPSTICK
Bilirubin, UA: NEGATIVE
Blood, UA: NEGATIVE
Glucose, UA: NEGATIVE
Ketones, UA: NEGATIVE
Nitrite, UA: NEGATIVE
Protein, UA: NEGATIVE
Spec Grav, UA: 1.02
Urobilinogen, UA: 0.2
pH, UA: 6.5

## 2013-06-20 MED ORDER — DIPHENHYDRAMINE HCL 50 MG PO CAPS: ORAL_CAPSULE | ORAL | Status: DC

## 2013-06-20 MED ORDER — CEFPODOXIME PROXETIL 100 MG PO TABS: 100.0000 mg | ORAL_TABLET | Freq: Two times a day (BID) | ORAL | Status: DC

## 2013-06-20 MED ORDER — KETOROLAC TROMETHAMINE 0.5 % OP SOLN: 1.0000 [drp] | Freq: Every day | OPHTHALMIC | Status: DC

## 2013-06-20 NOTE — Progress Notes (Addendum)
Subjective:   This chart was scribed for [name], MD, by Neta Ehlers, ED Scribe. This patient was started at 6:29 PM.   Patient ID: Taylor Roth, female    DOB: September 02, 1928, 80 y.o.   MRN: [id number]  No chief complaint on file.   HPI  Taylor Roth is a 80 y.o. female who presents to [facility] complaining of a possible UTI which has worsened in the past three days. She reports symptoms of urinary urgency, frequency, hesitancy, dysuria, and lower pelvic pain. The pt states the urine has also been malodorous and darker in color.   The pt reports chronic back pain due to scoliosis, and she states she is unable to discern increased back pain related to a UTI.   She denies abdominal pain, nausea, or emesis. She also denies appetite changes.   The pt reports this is her third UTI within twelve months with the most recent occurring in December. Her daughter reports the pt may have been prescribed cefpodoximetroxetil 100 mg by a urologist for her previous UTI and tolerated the medication without an allergic reaction.   Past Medical History  Diagnosis Date   Osteoporosis    Depression    Anxiety    COPD (chronic obstructive pulmonary disease)    GERD (gastroesophageal reflux disease)    Abuse     benzos/narcotics   Migraines    Scoliosis    Insomnia, unspecified    Vitamin B deficiency    Weight loss    Chronic mental illness    Arterial tortuosity (aorta) 12/11/2012   Chronic kidney disease, stage IV (severe) 12/11/2012    Current Outpatient Prescriptions on File Prior to Visit  Medication Sig Dispense Refill   aspirin 81 MG chewable tablet Chew 1 tablet (81 mg total) by mouth daily.       bisacodyl (DULCOLAX) 5 MG EC tablet Take 1 tablet (5 mg total) by mouth at bedtime.  30 tablet  0   clorazepate (TRANXENE) 7.5 MG tablet TAKE 1 TABLET BY MOUTH THREE   TIMES DAILY  90 tablet  0   diphenhydrAMINE (BENADRYL) 50 MG capsule TAKE 1 CAPSULE BY MOUTH THREE  TIMES DAILY  90 capsule  0   dorzolamide-timolol (COSOPT) 22.3-6.8 MG/ML ophthalmic solution Place 1 drop into both eyes 2 (two) times daily.       fluticasone (FLOVENT HFA) 110 MCG/ACT inhaler Inhale 1 puff into the lungs 2 (two) times daily.  1 Inhaler  5   hydrocortisone (PROCTOSOL HC) 2.5 % rectal cream apply twice a day to affected area  56.7 g  5   hydrOXYzine (ATARAX/VISTARIL) 25 MG tablet Take 1 tablet (25 mg total) by mouth 3 (three) times daily as needed.  90 tablet  10   Ipratropium-Albuterol (COMBIVENT) 20-100 MCG/ACT AERS respimat Inhale 1 puff into the lungs 3 (three) times daily.  1 Inhaler  5   ketorolac (ACULAR) 0.5 % ophthalmic solution Place 1 drop into both eyes daily. One drop in her eyes once a day  5 mL  1   levalbuterol (XOPENEX HFA) 45 MCG/ACT inhaler Inhale 2 puffs every 4-6 hours as needed  1 Inhaler  1   nitroGLYCERIN (NITROSTAT) 0.4 MG SL tablet Place 0.4 mg under the tongue every 5 (five) minutes as needed.       Nutritional Supplements (ENSURE COMPLETE SHAKE) LIQD Take 1 Bottle by mouth 2 (two) times daily.  60 Bottle  11   ondansetron (ZOFRAN) 4 MG tablet Take  1 tablet (4 mg total) by mouth every 6 (six) hours as needed for nausea.  20 tablet  0   Polyethyl Glycol-Propyl Glycol (SYSTANE OP) Apply 1 drop to eye as needed (for dry eyes).       REMERON SOLTAB 45 MG disintegrating tablet Dissolve 1 tablet on tongue with or without water at bedtime.  30 tablet  5   travoprost, benzalkonium, (TRAVATAN) 0.004 % ophthalmic solution Place 1 drop into both eyes at bedtime.       triazolam (HALCION) 0.25 MG tablet Take two tablets by mouth at bedtime to help rest  60 tablet  5   ZANTAC 150 MG tablet TAKE 1 TABLET (150 MG TOTAL) BY MOUTH TWO   (TWO) TIMES DAILY.  60 tablet  3   No current facility-administered medications on file prior to visit.    Allergies  Allergen Reactions   Macrobid [Nitrofurantoin Macrocrystal]     Patient developed a rash on the  medication   Azithromycin     unknown   Doxycycline     unknown   Escitalopram Oxalate     unknown   Fioricet [Butalbital-Apap-Caffeine]     Drunk.   Latex     unknown   Levofloxacin     unknown   Oxycodone     reaction to synthetic codeine   Restasis [Cyclosporine]     unknown   Sulfa Antibiotics    Tylenol [Acetaminophen]     jaundice   Biaxin [Clarithromycin] Rash    With burning sensation   Review of Systems  Constitutional: Positive for appetite change. Negative for fever.  Gastrointestinal: Negative for nausea, vomiting and abdominal pain.  Genitourinary: Positive for dysuria, urgency, frequency and pelvic pain.  Musculoskeletal: Positive for back pain.    Vitals: BP 140/80   Pulse 67   Temp(Src) 98.4 F (36.9 C) (Oral)   Ht 4\' 10"  (1.473 m)   Wt 96 lb (43.545 kg)   BMI 20.07 kg/m2   SpO2 95%     Objective:   Physical Exam  Nursing note and vitals reviewed. Constitutional: She is oriented to person, place, and time. She appears well-developed and well-nourished. No distress.  HENT:  Head: Normocephalic and atraumatic.  Eyes: EOM are normal.  Neck: Neck supple. No tracheal deviation present.  Cardiovascular: Normal rate.   Pulmonary/Chest: Effort normal. No respiratory distress.  Musculoskeletal: Normal range of motion.  Neurological: She is alert and oriented to person, place, and time.  Skin: Skin is warm and dry.  Psychiatric: She has a normal mood and affect. Her behavior is normal.    Results for orders placed in visit on 06/20/13  POCT URINALYSIS DIPSTICK      Result Value Ref Range   Color, UA yellow     Clarity, UA cloudy     Glucose, UA neg     Bilirubin, UA neg     Ketones, UA neg     Spec Grav, UA 1.020     Blood, UA neg     pH, UA 6.5     Protein, UA neg     Urobilinogen, UA 0.2     Nitrite, UA neg     Leukocytes, UA moderate (2+)    POCT UA - MICROSCOPIC ONLY      Result Value Ref Range   WBC, Ur, HPF, POC 30-35     RBC,  urine, microscopic 1-3     Bacteria, U Microscopic 4+     Mucus, UA neg  Epithelial cells, urine per micros 2-4     Crystals, Ur, HPF, POC neg     Casts, Ur, LPF, POC neg     Yeast, UA neg    POCT CBC      Result Value Ref Range   WBC 6.2  4.6 - 10.2 K/uL   Lymph, poc 2.3  0.6 - 3.4   POC LYMPH PERCENT 37.9  10 - 50 %L   MID (cbc) 0.6  0 - 0.9   POC MID % 8.9  0 - 12 %M   POC Granulocyte 3.3  2 - 6.9   Granulocyte percent 53.2  37 - 80 %G   RBC 3.91 (*) 4.04 - 5.48 M/uL   Hemoglobin 11.3 (*) 12.2 - 16.2 g/dL   HCT, POC 36.0 (*) 37.7 - 47.9 %   MCV 92.1  80 - 97 fL   MCH, POC 28.9  27 - 31.2 pg   MCHC 31.4 (*) 31.8 - 35.4 g/dL   RDW, POC 15.9     Platelet Count, POC 173  142 - 424 K/uL   MPV 9.8  0 - 99.8 fL      Assessment & Plan:   Will prescribe pt cefpodoximetroxetil. Informed pt to return to [facility] or visit the ED if symptoms worsen. Also renew pt's prescriptions for diphenhydramine and Acular.  Dysuria - Plan: POCT urinalysis dipstick, POCT UA - Microscopic Only, Urine culture  Chronic kidney disease, stage IV (severe) - Plan: Basic metabolic panel  UTI (urinary tract infection) - Plan: Urine culture, POCT CBC, Basic metabolic panel  Eye pain - Plan: ketorolac (ACULAR) 0.5 % ophthalmic solution, DISCONTINUED: ketorolac (ACULAR) 0.5 % ophthalmic solution  Meds ordered this encounter  Medications   HYDROcodone-acetaminophen (NORCO) 10-325 MG per tablet    Sig: Take 1 tablet by mouth every 6 (six) hours as needed.   DISCONTD: cefpodoxime (VANTIN) 100 MG tablet    Sig: Take 1 tablet (100 mg total) by mouth 2 (two) times daily.    Dispense:  14 tablet    Refill:  0   DISCONTD: diphenhydrAMINE (BENADRYL) 50 MG capsule    Sig: TAKE 1 CAPSULE BY MOUTH THREE TIMES DAILY    Dispense:  90 capsule    Refill:  0   DISCONTD: ketorolac (ACULAR) 0.5 % ophthalmic solution    Sig: Place 1 drop into both eyes daily. One drop in her eyes once a day    Dispense:  5 mL     Refill:  1    Order Specific Question:  Supervising Provider    Answer:  DOOLITTLE, ROBERT P [[id number]]   cefpodoxime (VANTIN) 100 MG tablet    Sig: Take 1 tablet (100 mg total) by mouth 2 (two) times daily.    Dispense:  14 tablet    Refill:  0   diphenhydrAMINE (BENADRYL) 50 MG capsule    Sig: TAKE 1 CAPSULE BY MOUTH THREE TIMES DAILY    Dispense:  90 capsule    Refill:  0   ketorolac (ACULAR) 0.5 % ophthalmic solution    Sig: Place 1 drop into both eyes daily. One drop in her eyes once a day    Dispense:  5 mL    Refill:  1    Order Specific Question:  Supervising Provider    Answer:  DOOLITTLE, ROBERT P [id number]    I personally performed the services described in this documentation, which was scribed in my presence. The recorded information has been reviewed and  considered, and addended by me as needed.  Delman Cheadle, MD MPH

## 2013-06-21 LAB — BASIC METABOLIC PANEL
BUN: 23 mg/dL (ref 6–23)
CO2: 29 meq/L (ref 19–32)
Calcium: 9 mg/dL (ref 8.4–10.5)
Chloride: 103 mEq/L (ref 96–112)
Creat: 0.8 mg/dL (ref 0.50–1.10)
GLUCOSE: 87 mg/dL (ref 70–99)
Potassium: 4.5 mEq/L (ref 3.5–5.3)
SODIUM: 139 meq/L (ref 135–145)

## 2013-06-22 NOTE — Progress Notes (Signed)
Subjective:   This chart was scribed for [name], MD, by Neta Ehlers, ED Scribe. This patient was started at 6:29 PM.   Patient ID: Taylor Roth, female    DOB: 07-02-1928, 80 y.o.   MRN: [id number]  No chief complaint on file.   HPI  Taylor Roth is a 80 y.o. female who presents to [facility] complaining of a possible UTI which has worsened in the past three days. She reports symptoms of urinary urgency, frequency, hesitancy, dysuria, and lower pelvic pain. The pt states the urine has also been malodorous and darker in color.   The pt reports chronic back pain due to scoliosis, and she states she is unable to discern increased back pain related to a UTI.   She denies abdominal pain, nausea, or emesis. She also denies appetite changes.   The pt reports this is her third UTI within twelve months with the most recent occurring in December. Her daughter reports the pt may have been prescribed cefpodoxime troxetil 100 mg by a urologist for her previous UTI and tolerated the medication without an allergic reaction. She has numerous antibiotic allergies inc penicillin which she reports are all severe  Past Medical History  Diagnosis Date  . Osteoporosis   . Depression   . Anxiety   . COPD (chronic obstructive pulmonary disease)   . GERD (gastroesophageal reflux disease)   . Abuse     benzos/narcotics  . Migraines   . Scoliosis   . Insomnia, unspecified   . Vitamin B deficiency   . Weight loss   . Chronic mental illness   . Arterial tortuosity (aorta) 12/11/2012  . Chronic kidney disease, stage IV (severe) 12/11/2012    Current Outpatient Prescriptions on File Prior to Visit  Medication Sig Dispense Refill  . aspirin 81 MG chewable tablet Chew 1 tablet (81 mg total) by mouth daily.      . bisacodyl (DULCOLAX) 5 MG EC tablet Take 1 tablet (5 mg total) by mouth at bedtime.  30 tablet  0  . clorazepate (TRANXENE) 7.5 MG tablet TAKE 1 TABLET BY MOUTH THREE   TIMES DAILY  90  tablet  0  . diphenhydrAMINE (BENADRYL) 50 MG capsule TAKE 1 CAPSULE BY MOUTH THREE TIMES DAILY  90 capsule  0  . dorzolamide-timolol (COSOPT) 22.3-6.8 MG/ML ophthalmic solution Place 1 drop into both eyes 2 (two) times daily.      . fluticasone (FLOVENT HFA) 110 MCG/ACT inhaler Inhale 1 puff into the lungs 2 (two) times daily.  1 Inhaler  5  . hydrocortisone (PROCTOSOL HC) 2.5 % rectal cream apply twice a day to affected area  56.7 g  5  . hydrOXYzine (ATARAX/VISTARIL) 25 MG tablet Take 1 tablet (25 mg total) by mouth 3 (three) times daily as needed.  90 tablet  10  . Ipratropium-Albuterol (COMBIVENT) 20-100 MCG/ACT AERS respimat Inhale 1 puff into the lungs 3 (three) times daily.  1 Inhaler  5  . ketorolac (ACULAR) 0.5 % ophthalmic solution Place 1 drop into both eyes daily. One drop in her eyes once a day  5 mL  1  . levalbuterol (XOPENEX HFA) 45 MCG/ACT inhaler Inhale 2 puffs every 4-6 hours as needed  1 Inhaler  1  . nitroGLYCERIN (NITROSTAT) 0.4 MG SL tablet Place 0.4 mg under the tongue every 5 (five) minutes as needed.      . Nutritional Supplements (ENSURE COMPLETE SHAKE) LIQD Take 1 Bottle by mouth 2 (two) times daily.  60 Bottle  11  . ondansetron (ZOFRAN) 4 MG tablet Take 1 tablet (4 mg total) by mouth every 6 (six) hours as needed for nausea.  20 tablet  0  . Polyethyl Glycol-Propyl Glycol (SYSTANE OP) Apply 1 drop to eye as needed (for dry eyes).      . REMERON SOLTAB 45 MG disintegrating tablet Dissolve 1 tablet on tongue with or without water at bedtime.  30 tablet  5  . travoprost, benzalkonium, (TRAVATAN) 0.004 % ophthalmic solution Place 1 drop into both eyes at bedtime.      . triazolam (HALCION) 0.25 MG tablet Take two tablets by mouth at bedtime to help rest  60 tablet  5  . ZANTAC 150 MG tablet TAKE 1 TABLET (150 MG TOTAL) BY MOUTH TWO   (TWO) TIMES DAILY.  60 tablet  3   No current facility-administered medications on file prior to visit.    Allergies  Allergen Reactions   . Macrobid [Nitrofurantoin Macrocrystal]     Patient developed a rash on the medication  . Azithromycin     unknown  . Doxycycline     unknown  . Escitalopram Oxalate     unknown  . Fioricet [Butalbital-Apap-Caffeine]     Drunk.  . Latex     unknown  . Levofloxacin     unknown  . Oxycodone     reaction to synthetic codeine  . Restasis [Cyclosporine]     unknown  . Sulfa Antibiotics   . Tylenol [Acetaminophen]     jaundice  . Biaxin [Clarithromycin] Rash    With burning sensation   Review of Systems  Constitutional: Positive for appetite change. Negative for fever, chills, activity change and unexpected weight change.  Gastrointestinal: Negative for nausea, vomiting, abdominal pain and abdominal distention.  Genitourinary: Positive for dysuria, urgency, frequency, flank pain and pelvic pain. Negative for hematuria and difficulty urinating.  Musculoskeletal: Positive for back pain.    Vitals: BP 140/80  Pulse 67  Temp(Src) 98.4 F (36.9 C) (Oral)  Ht 4\' 10"  (1.473 m)  Wt 96 lb (43.545 kg)  BMI 20.07 kg/m2  SpO2 95% Objective:   Physical Exam  Nursing note and vitals reviewed. Constitutional: She is oriented to person, place, and time. She appears well-developed and well-nourished. No distress.  HENT:  Head: Normocephalic and atraumatic.  Eyes: EOM are normal.  Neck: Neck supple. No tracheal deviation present.  Cardiovascular: Normal rate.   Pulmonary/Chest: Effort normal. No respiratory distress.  Musculoskeletal: Normal range of motion.  Neurological: She is alert and oriented to person, place, and time.  Skin: Skin is warm and dry.  Psychiatric: She has a normal mood and affect. Her behavior is normal.    Results for orders placed in visit on 06/20/13  URINE CULTURE      Result Value Ref Range   Colony Count >=100,000 COLONIES/ML     Preliminary Report ESCHERICHIA COLI    BASIC METABOLIC PANEL      Result Value Ref Range   Sodium 139  135 - 145 mEq/L    Potassium 4.5  3.5 - 5.3 mEq/L   Chloride 103  96 - 112 mEq/L   CO2 29  19 - 32 mEq/L   Glucose, Bld 87  70 - 99 mg/dL   BUN 23  6 - 23 mg/dL   Creat 0.80  0.50 - 1.10 mg/dL   Calcium 9.0  8.4 - 10.5 mg/dL  POCT URINALYSIS DIPSTICK      Result Value  Ref Range   Color, UA yellow     Clarity, UA cloudy     Glucose, UA neg     Bilirubin, UA neg     Ketones, UA neg     Spec Grav, UA 1.020     Blood, UA neg     pH, UA 6.5     Protein, UA neg     Urobilinogen, UA 0.2     Nitrite, UA neg     Leukocytes, UA moderate (2+)    POCT UA - MICROSCOPIC ONLY      Result Value Ref Range   WBC, Ur, HPF, POC 30-35     RBC, urine, microscopic 1-3     Bacteria, U Microscopic 4+     Mucus, UA neg     Epithelial cells, urine per micros 2-4     Crystals, Ur, HPF, POC neg     Casts, Ur, LPF, POC neg     Yeast, UA neg    POCT CBC      Result Value Ref Range   WBC 6.2  4.6 - 10.2 K/uL   Lymph, poc 2.3  0.6 - 3.4   POC LYMPH PERCENT 37.9  10 - 50 %L   MID (cbc) 0.6  0 - 0.9   POC MID % 8.9  0 - 12 %M   POC Granulocyte 3.3  2 - 6.9   Granulocyte percent 53.2  37 - 80 %G   RBC 3.91 (*) 4.04 - 5.48 M/uL   Hemoglobin 11.3 (*) 12.2 - 16.2 g/dL   HCT, POC 36.0 (*) 37.7 - 47.9 %   MCV 92.1  80 - 97 fL   MCH, POC 28.9  27 - 31.2 pg   MCHC 31.4 (*) 31.8 - 35.4 g/dL   RDW, POC 15.9     Platelet Count, POC 173  142 - 424 K/uL   MPV 9.8  0 - 99.8 fL      Assessment & Plan:  Dysuria - Plan: POCT urinalysis dipstick, POCT UA - Microscopic Only, Urine culture  Chronic kidney disease, stage IV (severe) - Plan: Basic metabolic panel  UTI (urinary tract infection) - Plan: Urine culture, POCT CBC, Basic metabolic panel - Pt w/ mult antibiotic allergies but reports she was rx'ed cefpodoxime w/ last UTI and responded well so will try this abx again while Clx P.  Informed pt to return to [facility] or visit the ED if symptoms worsen.  Eye pain - Plan: ketorolac (ACULAR) 0.5 % ophthalmic solution, DISCONTINUED:  ketorolac (ACULAR) 0.5 % ophthalmic solution  Refilled pt's prescriptions for diphenhydramine and Acular at her daughter's req.  Meds ordered this encounter  Medications  . HYDROcodone-acetaminophen (NORCO) 10-325 MG per tablet    Sig: Take 1 tablet by mouth every 6 (six) hours as needed.  Marland Kitchen DISCONTD: cefpodoxime (VANTIN) 100 MG tablet    Sig: Take 1 tablet (100 mg total) by mouth 2 (two) times daily.    Dispense:  14 tablet    Refill:  0  . DISCONTD: diphenhydrAMINE (BENADRYL) 50 MG capsule    Sig: TAKE 1 CAPSULE BY MOUTH THREE TIMES DAILY    Dispense:  90 capsule    Refill:  0  . DISCONTD: ketorolac (ACULAR) 0.5 % ophthalmic solution    Sig: Place 1 drop into both eyes daily. One drop in her eyes once a day    Dispense:  5 mL    Refill:  1    Order Specific Question:  Supervising Provider    Answer:  Tami Lin P [id number]  . cefpodoxime (VANTIN) 100 MG tablet    Sig: Take 1 tablet (100 mg total) by mouth 2 (two) times daily.    Dispense:  14 tablet    Refill:  0  . diphenhydrAMINE (BENADRYL) 50 MG capsule    Sig: TAKE 1 CAPSULE BY MOUTH THREE TIMES DAILY    Dispense:  90 capsule    Refill:  0  . ketorolac (ACULAR) 0.5 % ophthalmic solution    Sig: Place 1 drop into both eyes daily. One drop in her eyes once a day    Dispense:  5 mL    Refill:  1    Order Specific Question:  Supervising Provider    Answer:  DOOLITTLE, ROBERT P [id number]    I personally performed the services described in this documentation, which was scribed in my presence. The recorded information has been reviewed and considered, and addended by me as needed.  Delman Cheadle, MD MPH

## 2013-06-23 LAB — URINE CULTURE: Colony Count: >=100000

## 2013-06-25 ENCOUNTER — Telehealth: Payer: Self-pay

## 2013-06-25 NOTE — Telephone Encounter (Signed)
Pt called and said she thinks her UTI medication is effecting her eye sight, please call [phone] or [phone]

## 2013-06-26 NOTE — Telephone Encounter (Signed)
LM for rtn call- need more information to the reaction she is having. This was a medication prescribed in the past without complications per OV

## 2013-06-27 NOTE — Telephone Encounter (Signed)
LMOM of info

## 2013-06-27 NOTE — Telephone Encounter (Signed)
Seems highly unlikely that her antibiotic is causing blurred vision or leg pain and this is certainly not being caused by her UTI. Ok to continue w/ benadryl but if she is still having the extremity and vision sxs, needs to be seen in office or by PCP for eval asap.

## 2013-06-27 NOTE — Telephone Encounter (Signed)
Pt says that her "legs are burning and her eye sight is blurry". She said that the nurse at the home said that she should continue taking them. She says that she takes a benadryl with it and it helps. She wanted me to send a message to you Dr. Brigitte Pulse and get your opinion.

## 2013-10-25 ENCOUNTER — Emergency Department (HOSPITAL_COMMUNITY)
Admission: EM | Admit: 2013-10-25 | Discharge: 2013-10-25 | Disposition: A | Discharge status: Home / Self Care | Payer: Medicare Other | Attending: Emergency Medicine | Admitting: Emergency Medicine

## 2013-10-25 ENCOUNTER — Encounter (HOSPITAL_COMMUNITY): Payer: Self-pay | Admitting: Emergency Medicine

## 2013-10-25 DIAGNOSIS — IMO0002 Reserved for concepts with insufficient information to code with codable children: Secondary | ICD-10-CM | POA: Diagnosis not present

## 2013-10-25 DIAGNOSIS — J449 Chronic obstructive pulmonary disease, unspecified: Secondary | ICD-10-CM | POA: Insufficient documentation

## 2013-10-25 DIAGNOSIS — Z7982 Long term (current) use of aspirin: Secondary | ICD-10-CM | POA: Insufficient documentation

## 2013-10-25 DIAGNOSIS — R300 Dysuria: Secondary | ICD-10-CM | POA: Diagnosis present

## 2013-10-25 DIAGNOSIS — F17200 Nicotine dependence, unspecified, uncomplicated: Secondary | ICD-10-CM | POA: Insufficient documentation

## 2013-10-25 DIAGNOSIS — F3289 Other specified depressive episodes: Secondary | ICD-10-CM | POA: Diagnosis not present

## 2013-10-25 DIAGNOSIS — G4700 Insomnia, unspecified: Secondary | ICD-10-CM | POA: Diagnosis not present

## 2013-10-25 DIAGNOSIS — Z8639 Personal history of other endocrine, nutritional and metabolic disease: Secondary | ICD-10-CM | POA: Insufficient documentation

## 2013-10-25 DIAGNOSIS — G43909 Migraine, unspecified, not intractable, without status migrainosus: Secondary | ICD-10-CM | POA: Insufficient documentation

## 2013-10-25 DIAGNOSIS — N184 Chronic kidney disease, stage 4 (severe): Secondary | ICD-10-CM | POA: Insufficient documentation

## 2013-10-25 DIAGNOSIS — Z90710 Acquired absence of both cervix and uterus: Secondary | ICD-10-CM | POA: Diagnosis not present

## 2013-10-25 DIAGNOSIS — Z9089 Acquired absence of other organs: Secondary | ICD-10-CM | POA: Diagnosis not present

## 2013-10-25 DIAGNOSIS — F329 Major depressive disorder, single episode, unspecified: Secondary | ICD-10-CM | POA: Insufficient documentation

## 2013-10-25 DIAGNOSIS — M4120 Other idiopathic scoliosis, site unspecified: Secondary | ICD-10-CM | POA: Insufficient documentation

## 2013-10-25 DIAGNOSIS — Z91040 Latex allergy status: Secondary | ICD-10-CM | POA: Insufficient documentation

## 2013-10-25 DIAGNOSIS — Z79899 Other long term (current) drug therapy: Secondary | ICD-10-CM | POA: Diagnosis not present

## 2013-10-25 DIAGNOSIS — M810 Age-related osteoporosis without current pathological fracture: Secondary | ICD-10-CM | POA: Diagnosis not present

## 2013-10-25 DIAGNOSIS — T7421XA Adult sexual abuse, confirmed, initial encounter: Secondary | ICD-10-CM | POA: Diagnosis not present

## 2013-10-25 DIAGNOSIS — K219 Gastro-esophageal reflux disease without esophagitis: Secondary | ICD-10-CM | POA: Insufficient documentation

## 2013-10-25 DIAGNOSIS — F411 Generalized anxiety disorder: Secondary | ICD-10-CM | POA: Insufficient documentation

## 2013-10-25 DIAGNOSIS — R1032 Left lower quadrant pain: Secondary | ICD-10-CM | POA: Diagnosis not present

## 2013-10-25 DIAGNOSIS — I120 Hypertensive chronic kidney disease with stage 5 chronic kidney disease or end stage renal disease: Secondary | ICD-10-CM | POA: Insufficient documentation

## 2013-10-25 DIAGNOSIS — Z9189 Other specified personal risk factors, not elsewhere classified: Secondary | ICD-10-CM

## 2013-10-25 DIAGNOSIS — J4489 Other specified chronic obstructive pulmonary disease: Secondary | ICD-10-CM | POA: Insufficient documentation
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
## 2013-10-25 NOTE — ED Notes (Addendum)
Pt refused to wait for d/c papers or instructions home.  Pt left with daughter. Unable to get vitals.

## 2013-11-09 ENCOUNTER — Ambulatory Visit (INDEPENDENT_AMBULATORY_CARE_PROVIDER_SITE_OTHER): Payer: Medicare Other | Admitting: Family Medicine

## 2013-11-09 VITALS — BP 110/62 | HR 87 | Temp 97.9°F | Resp 16 | Ht 59.0 in | Wt 101.0 lb

## 2013-11-09 DIAGNOSIS — H8110 Benign paroxysmal vertigo, unspecified ear: Secondary | ICD-10-CM

## 2013-11-09 DIAGNOSIS — D649 Anemia, unspecified: Secondary | ICD-10-CM

## 2013-11-09 DIAGNOSIS — R42 Dizziness and giddiness: Secondary | ICD-10-CM

## 2013-11-09 LAB — POCT CBC
Granulocyte percent: 66 % (ref 37–80)
HCT, POC: 42.3 % (ref 37.7–47.9)
Hemoglobin: 13.6 g/dL (ref 12.2–16.2)
Lymph, poc: 1.6 (ref 0.6–3.4)
MCH, POC: 29.3 pg (ref 27–31.2)
MCHC: 32.2 g/dL (ref 31.8–35.4)
MCV: 91.1 fL (ref 80–97)
MID (cbc): 0.5 (ref 0–0.9)
MPV: 7.8 fL (ref 0–99.8)
POC Granulocyte: 4.2 (ref 2–6.9)
POC LYMPH PERCENT: 25.7 % (ref 10–50)
POC MID %: 8.3 % (ref 0–12)
Platelet Count, POC: 242 K/uL (ref 142–424)
RBC: 4.64 M/uL (ref 4.04–5.48)
RDW, POC: 14.9 %
WBC: 6.4 K/uL (ref 4.6–10.2)

## 2013-11-09 LAB — COMPLETE METABOLIC PANEL WITHOUT GFR
ALT: 12 U/L (ref 0–35)
AST: 20 U/L (ref 0–37)
Albumin: 4.3 g/dL (ref 3.5–5.2)
Alkaline Phosphatase: 116 U/L (ref 39–117)
BUN: 20 mg/dL (ref 6–23)
CO2: 28 meq/L (ref 19–32)
Calcium: 9.6 mg/dL (ref 8.4–10.5)
Chloride: 102 meq/L (ref 96–112)
Creat: 0.85 mg/dL (ref 0.50–1.10)
GFR, Est African American: 74 mL/min
GFR, Est Non African American: 64 mL/min
Glucose, Bld: 94 mg/dL (ref 70–99)
Potassium: 4.4 meq/L (ref 3.5–5.3)
Sodium: 140 meq/L (ref 135–145)
Total Bilirubin: 0.3 mg/dL (ref 0.2–1.2)
Total Protein: 6.8 g/dL (ref 6.0–8.3)

## 2013-11-09 NOTE — Patient Instructions (Signed)
Vertigo  Vertigo means you feel like you or your surroundings are moving when they are not. Vertigo can be dangerous if it occurs when you are at work, driving, or performing difficult activities.   CAUSES   Vertigo occurs when there is a conflict of signals sent to your brain from the visual and sensory systems in your body. There are many different causes of vertigo, including:   Infections, especially in the inner ear.   A bad reaction to a drug or misuse of alcohol and medicines.   Withdrawal from drugs or alcohol.   Rapidly changing positions, such as lying down or rolling over in bed.   A migraine headache.   Decreased blood flow to the brain.   Increased pressure in the brain from a head injury, infection, tumor, or bleeding.  SYMPTOMS   You may feel as though the world is spinning around or you are falling to the ground. Because your balance is upset, vertigo can cause nausea and vomiting. You may have involuntary eye movements (nystagmus).  DIAGNOSIS   Vertigo is usually diagnosed by physical exam. If the cause of your vertigo is unknown, your caregiver may perform imaging tests, such as an MRI scan (magnetic resonance imaging).  TREATMENT   Most cases of vertigo resolve on their own, without treatment. Depending on the cause, your caregiver may prescribe certain medicines. If your vertigo is related to body position issues, your caregiver may recommend movements or procedures to correct the problem. In rare cases, if your vertigo is caused by certain inner ear problems, you may need surgery.  HOME CARE INSTRUCTIONS    Follow your caregiver's instructions.   Avoid driving.   Avoid operating heavy machinery.   Avoid performing any tasks that would be dangerous to you or others during a vertigo episode.   Tell your caregiver if you notice that certain medicines seem to be causing your vertigo. Some of the medicines used to treat vertigo episodes can actually make them worse in some people.  SEEK  IMMEDIATE MEDICAL CARE IF:    Your medicines do not relieve your vertigo or are making it worse.   You develop problems with talking, walking, weakness, or using your arms, hands, or legs.   You develop severe headaches.   Your nausea or vomiting continues or gets worse.   You develop visual changes.   A family member notices behavioral changes.   Your condition gets worse.  MAKE SURE YOU:   Understand these instructions.   Will watch your condition.   Will get help right away if you are not doing well or get worse.  Document Released: 11/08/2004 Document Revised: 04/23/2011 Document Reviewed: 08/17/2010  ExitCare Patient Information 2015 [employer]. This information is not intended to replace advice given to you by your health care provider. Make sure you discuss any questions you have with your health care provider.

## 2013-11-09 NOTE — Progress Notes (Signed)
 Chief Complaint:  Chief Complaint  Patient presents with  . Dizziness    2 weeks    HPI: Taylor Roth is a 80 y.o. female who is here for  2 week history of dizziness with movement of her head, she actually has taken meclizine in the past for similar sxs but she was told that they could not do that at the [place] [facility] where she is at, lives at [facility].  She is doing well otherwise, no CP, no SOB. She is here with her daughter. She denies any urinary sxs, she is not dizzy with sitting or standing , just with head movement. NO URI ssx no ear pain.  No fvers or chills nause avomiting abd pain  Past Medical History  Diagnosis Date  . Osteoporosis   . Depression   . Anxiety   . COPD (chronic obstructive pulmonary disease)   . GERD (gastroesophageal reflux disease)   . Abuse     benzos/narcotics  . Migraines   . Scoliosis   . Insomnia, unspecified   . Vitamin B deficiency   . Weight loss   . Chronic mental illness   . Arterial tortuosity (aorta) 12/11/2012  . Chronic kidney disease, stage IV (severe) 12/11/2012  . Substance abuse     Benzos and Narcotics, she does not get any of those medicines from [facility], we have empahtically told her that.    Past Surgical History  Procedure Laterality Date  . Cholecystectomy    . Partial hysterectomy    . Abdominal hysterectomy     History   Social History  . Marital Status: Divorced    Spouse Name: N/A    Number of Children: N/A  . Years of Education: N/A   Social History Main Topics  . Smoking status: Former Smoker -- 0.50 packs/day    Types: Cigarettes  . Smokeless tobacco: Never Used  . Alcohol Use: No  . Drug Use: No  . Sexual Activity: No   Other Topics Concern  . None   Social History Narrative  . None   Family History  Problem Relation Age of Onset  . Stroke Mother   . Diabetes Mother   . Cancer Father 61    esophageal   Allergies  Allergen Reactions  . Macrobid [Nitrofurantoin Macrocrystal]      Patient developed a rash on the medication  . Azithromycin     unknown  . Doxycycline     unknown  . Escitalopram Oxalate     unknown  . Fioricet [Butalbital-Apap-Caffeine]     Drunk.  . Latex     unknown  . Levofloxacin     unknown  . Oxycodone     reaction to synthetic codeine  . Restasis [Cyclosporine]     unknown  . Sulfa Antibiotics   . Tylenol [Acetaminophen]     jaundice  . Biaxin [Clarithromycin] Rash    With burning sensation   Prior to Admission medications   Medication Sig Start Date End Date Taking? Authorizing Provider  aspirin 81 MG chewable tablet Chew 1 tablet (81 mg total) by mouth daily. 12/12/12  Yes Christina P Rama, MD  bisacodyl (DULCOLAX) 5 MG EC tablet Take 1 tablet (5 mg total) by mouth at bedtime. 12/12/12  Yes [name], MD  cefpodoxime (VANTIN) 100 MG tablet Take 1 tablet (100 mg total) by mouth 2 (two) times daily. 06/20/13  Yes [name], MD  clorazepate (TRANXENE) 7.5 MG tablet  TAKE 1 TABLET BY MOUTH THREE   TIMES DAILY 12/23/12  Yes Estill Dooms, MD  diphenhydrAMINE (BENADRYL) 50 MG capsule TAKE 1 CAPSULE BY MOUTH THREE TIMES DAILY 06/20/13  Yes [name], MD  dorzolamide-timolol (COSOPT) 22.3-6.8 MG/ML ophthalmic solution Place 1 drop into both eyes 2 (two) times daily.   Yes Historical Provider, MD  fluticasone (FLOVENT HFA) 110 MCG/ACT inhaler Inhale 1 puff into the lungs 2 (two) times daily. 12/27/11  Yes Darlyne Russian, MD  HYDROcodone-acetaminophen (NORCO) 10-325 MG per tablet Take 1 tablet by mouth every 6 (six) hours as needed.   Yes Historical Provider, MD  hydrocortisone (PROCTOSOL HC) 2.5 % rectal cream apply twice a day to affected area 08/05/12  Yes Darlyne Russian, MD  hydrOXYzine (ATARAX/VISTARIL) 25 MG tablet Take 1 tablet (25 mg total) by mouth 3 (three) times daily as needed. 06/16/12  Yes Darlyne Russian, MD  Ipratropium-Albuterol (COMBIVENT) 20-100 MCG/ACT AERS respimat Inhale 1 puff into the lungs 3 (three) times daily.  12/27/11  Yes Darlyne Russian, MD  ketorolac (ACULAR) 0.5 % ophthalmic solution Place 1 drop into both eyes daily. One drop in her eyes once a day 06/20/13  Yes [name], MD  levalbuterol [facility] HFA) 45 MCG/ACT inhaler Inhale 2 puffs every 4-6 hours as needed 08/26/12  Yes Darlyne Russian, MD  nitroGLYCERIN (NITROSTAT) 0.4 MG SL tablet Place 0.4 mg under the tongue every 5 (five) minutes as needed.   Yes Historical Provider, MD  Nutritional Supplements (ENSURE COMPLETE SHAKE) LIQD Take 1 Bottle by mouth 2 (two) times daily. 10/31/12  Yes Darlyne Russian, MD  ondansetron (ZOFRAN) 4 MG tablet Take 1 tablet (4 mg total) by mouth every 6 (six) hours as needed for nausea. 12/12/12  Yes [name], MD  Polyethyl Glycol-Propyl Glycol (SYSTANE OP) Apply 1 drop to eye as needed (for dry eyes).   Yes Historical Provider, MD  REMERON SOLTAB 45 MG disintegrating tablet Dissolve 1 tablet on tongue with or without water at bedtime. 04/21/12  Yes Darlyne Russian, MD  travoprost, benzalkonium, (TRAVATAN) 0.004 % ophthalmic solution Place 1 drop into both eyes at bedtime.   Yes Historical Provider, MD  triazolam (HALCION) 0.25 MG tablet Take two tablets by mouth at bedtime to help rest 01/20/13  Yes Estill Dooms, MD  ZANTAC 150 MG tablet TAKE 1 TABLET (150 MG TOTAL) BY MOUTH TWO   (TWO) TIMES DAILY. 07/14/12  Yes Darlyne Russian, MD     ROS: The patient denies fevers, chills, night sweats, unintentional weight loss, chest pain, palpitations, wheezing, dyspnea on exertion, nausea, vomiting, abdominal pain, dysuria, hematuria, melena, numbness, weakness, or tingling.   All other systems have been reviewed and were otherwise negative with the exception of those mentioned in the HPI and as above.    PHYSICAL EXAM: Filed Vitals:   11/09/13 1044  BP: 110/62  Pulse: 87  Temp: 97.9 F (36.6 C)  Resp: 16   Filed Vitals:   11/09/13 1044  Height: 4\' 11"  (1.499 m)  Weight: 101 lb (45.813 kg)   Body mass index is 20.39  kg/(m^2).  General: Alert, no acute distress HEENT:  Normocephalic, atraumatic, oropharynx patent. EOMI, PERRLA, fundo exam grossly nl, tm nl, no exudates, non tender sinuses Cardiovascular:  Regular rate and rhythm, no rubs murmurs or gallops.  No Carotid bruits, radial pulse intact. No pedal edema.  Respiratory: Clear to auscultation bilaterally.  No wheezes, rales, or rhonchi.  No cyanosis, no use of accessory musculature GI: No organomegaly, abdomen is soft and non-tender, positive bowel sounds.  No masses. Skin: No rashes. Neurologic: Facial musculature symmetric.Cn 2-12 grossly nl, she has dizziness with head movement, dix hallpike neg, no nystagmus.   Psychiatric: Patient is appropriate throughout our interaction. Lymphatic: No cervical lymphadenopathy Musculoskeletal: Gait intact.    LABS: Results for orders placed in visit on 11/09/13  COMPTE METABOLIC PANEL WITH GFR      Result Value Ref Range   Sodium 140  135 - 145 mEq/L   Potassium 4.4  3.5 - 5.3 mEq/L   Chloride 102  96 - 112 mEq/L   CO2 28  19 - 32 mEq/L   Glucose, Bld 94  70 - 99 mg/dL   BUN 20  6 - 23 mg/dL   Creat 0.85  0.50 - 1.10 mg/dL   Total Bilirubin 0.3  0.2 - 1.2 mg/dL   Alkaline Phosphatase 116  39 - 117 U/L   AST 20  0 - 37 U/L   ALT 12  0 - 35 U/L   Total Protein 6.8  6.0 - 8.3 g/dL   Albumin 4.3  3.5 - 5.2 g/dL   Calcium 9.6  8.4 - 10.5 mg/dL   GFR, Est African American 74     GFR, Est Non African American 64    POCT CBC      Result Value Ref Range   WBC 6.4  4.6 - 10.2 K/uL   Lymph, poc 1.6  0.6 - 3.4   POC LYMPH PERCENT 25.7  10 - 50 %L   MID (cbc) 0.5  0 - 0.9   POC MID % 8.3  0 - 12 %M   POC Granulocyte 4.2  2 - 6.9   Granulocyte percent 66.0  37 - 80 %G   RBC 4.64  4.04 - 5.48 M/uL   Hemoglobin 13.6  12.2 - 16.2 g/dL   HCT, POC 42.3  37.7 - 47.9 %   MCV 91.1  80 - 97 fL   MCH, POC 29.3  27 - 31.2 pg   MCHC 32.2  31.8 - 35.4 g/dL   RDW, POC 14.9     Platelet Count, POC 242  142 -  424 K/uL   MPV 7.8  0 - 99.8 fL     EKG/XRAY:   Primary read interpreted by Dr. Marin Comment at [facility].   ASSESSMENT/PLAN: Encounter Diagnoses  Name Primary?  . Dizziness and giddiness Yes  . Anemia, unspecified anemia type   . Benign paroxysmal positional vertigo, unspecified laterality    Neuro exam and orthostatics nl otc Meclizine. Forms filled out with specific instructions , not to take with vistrail closely if she is planning on taking it , both antihistamines Unable to give urine specimen, will drop off or get rechecked at nursing home F/u prn   Gross sideeffects, risk and benefits, and alternatives of medications d/w patient. Patient is aware that all medications have potential sideeffects and we are unable to predict every sideeffect or drug-drug interaction that may occur.  , [place], DO 11/12/2013 11:18 AM

## 2013-11-11 ENCOUNTER — Telehealth: Payer: Self-pay

## 2013-11-11 NOTE — Telephone Encounter (Signed)
Pt states she was feeling so much better and then came in here, someone gave her some kinda disease and she now feels awful Please call [phone]

## 2013-11-12 ENCOUNTER — Emergency Department (HOSPITAL_COMMUNITY)
Admission: EM | Admit: 2013-11-12 | Discharge: 2013-11-12 | Disposition: A | Discharge status: Home / Self Care | Payer: Medicare Other | Attending: Emergency Medicine | Admitting: Emergency Medicine

## 2013-11-12 ENCOUNTER — Encounter: Payer: Self-pay | Admitting: Family Medicine

## 2013-11-12 ENCOUNTER — Encounter (HOSPITAL_COMMUNITY): Payer: Self-pay | Admitting: Emergency Medicine

## 2013-11-12 ENCOUNTER — Emergency Department (HOSPITAL_COMMUNITY): Payer: Medicare Other

## 2013-11-12 ENCOUNTER — Telehealth: Payer: Self-pay | Admitting: Family Medicine

## 2013-11-12 DIAGNOSIS — Z87891 Personal history of nicotine dependence: Secondary | ICD-10-CM | POA: Insufficient documentation

## 2013-11-12 DIAGNOSIS — Z90710 Acquired absence of both cervix and uterus: Secondary | ICD-10-CM | POA: Insufficient documentation

## 2013-11-12 DIAGNOSIS — R42 Dizziness and giddiness: Secondary | ICD-10-CM

## 2013-11-12 DIAGNOSIS — Z8679 Personal history of other diseases of the circulatory system: Secondary | ICD-10-CM | POA: Diagnosis not present

## 2013-11-12 DIAGNOSIS — Z9889 Other specified postprocedural states: Secondary | ICD-10-CM | POA: Diagnosis not present

## 2013-11-12 DIAGNOSIS — M549 Dorsalgia, unspecified: Secondary | ICD-10-CM | POA: Diagnosis not present

## 2013-11-12 DIAGNOSIS — Z9089 Acquired absence of other organs: Secondary | ICD-10-CM | POA: Diagnosis not present

## 2013-11-12 DIAGNOSIS — M419 Scoliosis, unspecified: Secondary | ICD-10-CM | POA: Diagnosis not present

## 2013-11-12 DIAGNOSIS — R509 Fever, unspecified: Secondary | ICD-10-CM | POA: Diagnosis present

## 2013-11-12 DIAGNOSIS — Z792 Long term (current) use of antibiotics: Secondary | ICD-10-CM | POA: Diagnosis not present

## 2013-11-12 DIAGNOSIS — Z91040 Latex allergy status: Secondary | ICD-10-CM | POA: Diagnosis not present

## 2013-11-12 DIAGNOSIS — Z8639 Personal history of other endocrine, nutritional and metabolic disease: Secondary | ICD-10-CM | POA: Insufficient documentation

## 2013-11-12 DIAGNOSIS — Z7952 Long term (current) use of systemic steroids: Secondary | ICD-10-CM | POA: Diagnosis not present

## 2013-11-12 DIAGNOSIS — G4700 Insomnia, unspecified: Secondary | ICD-10-CM | POA: Diagnosis not present

## 2013-11-12 DIAGNOSIS — Z791 Long term (current) use of non-steroidal anti-inflammatories (NSAID): Secondary | ICD-10-CM | POA: Insufficient documentation

## 2013-11-12 DIAGNOSIS — G43909 Migraine, unspecified, not intractable, without status migrainosus: Secondary | ICD-10-CM | POA: Insufficient documentation

## 2013-11-12 DIAGNOSIS — J449 Chronic obstructive pulmonary disease, unspecified: Secondary | ICD-10-CM | POA: Diagnosis not present

## 2013-11-12 DIAGNOSIS — M810 Age-related osteoporosis without current pathological fracture: Secondary | ICD-10-CM | POA: Diagnosis not present

## 2013-11-12 DIAGNOSIS — K219 Gastro-esophageal reflux disease without esophagitis: Secondary | ICD-10-CM | POA: Diagnosis not present

## 2013-11-12 DIAGNOSIS — F329 Major depressive disorder, single episode, unspecified: Secondary | ICD-10-CM | POA: Insufficient documentation

## 2013-11-12 DIAGNOSIS — N390 Urinary tract infection, site not specified: Secondary | ICD-10-CM | POA: Insufficient documentation

## 2013-11-12 DIAGNOSIS — N184 Chronic kidney disease, stage 4 (severe): Secondary | ICD-10-CM | POA: Insufficient documentation

## 2013-11-12 DIAGNOSIS — F419 Anxiety disorder, unspecified: Secondary | ICD-10-CM | POA: Insufficient documentation

## 2013-11-12 DIAGNOSIS — Z79899 Other long term (current) drug therapy: Secondary | ICD-10-CM | POA: Diagnosis not present

## 2013-11-12 DIAGNOSIS — Z7982 Long term (current) use of aspirin: Secondary | ICD-10-CM | POA: Diagnosis not present

## 2013-11-12 LAB — URINE MICROSCOPIC-ADD ON

## 2013-11-12 LAB — URINALYSIS, ROUTINE W REFLEX MICROSCOPIC
Bilirubin Urine: NEGATIVE
Glucose, UA: NEGATIVE mg/dL
Hgb urine dipstick: NEGATIVE
Ketones, ur: NEGATIVE mg/dL
Nitrite: NEGATIVE
Protein, ur: NEGATIVE mg/dL
Specific Gravity, Urine: 1.018 (ref 1.005–1.030)
Urobilinogen, UA: 0.2 mg/dL (ref 0.0–1.0)
pH: 6.5 (ref 5.0–8.0)

## 2013-11-12 LAB — BASIC METABOLIC PANEL
Anion gap: 11 (ref 5–15)
BUN: 17 mg/dL (ref 6–23)
CO2: 27 mEq/L (ref 19–32)
Calcium: 8.8 mg/dL (ref 8.4–10.5)
Chloride: 98 mEq/L (ref 96–112)
Creatinine, Ser: 0.74 mg/dL (ref 0.50–1.10)
GFR calc Af Amer: 89 mL/min — ABNORMAL LOW (ref >90)
GFR calc non Af Amer: 77 mL/min — ABNORMAL LOW (ref >90)
Glucose, Bld: 98 mg/dL (ref 70–99)
Potassium: 3.8 mEq/L (ref 3.7–5.3)
Sodium: 136 mEq/L — ABNORMAL LOW (ref 137–147)

## 2013-11-12 LAB — CBC
HCT: 36.9 % (ref 36.0–46.0)
Hemoglobin: 11.9 g/dL — ABNORMAL LOW (ref 12.0–15.0)
MCH: 29.2 pg (ref 26.0–34.0)
MCHC: 32.2 g/dL (ref 30.0–36.0)
MCV: 90.4 fL (ref 78.0–100.0)
Platelets: 205 10*3/uL (ref 150–400)
RBC: 4.08 MIL/uL (ref 3.87–5.11)
RDW: 13.4 % (ref 11.5–15.5)
WBC: 4 10*3/uL (ref 4.0–10.5)

## 2013-11-12 MED ORDER — MECLIZINE HCL 25 MG PO TABS: 25.0000 mg | ORAL_TABLET | Freq: Three times a day (TID) | ORAL | Status: DC | PRN

## 2013-11-12 MED ORDER — MECLIZINE HCL 25 MG PO TABS: 25.0000 mg | ORAL_TABLET | Freq: Once | ORAL | Status: DC

## 2013-11-12 MED ORDER — CEPHALEXIN 500 MG PO CAPS: 500.0000 mg | ORAL_CAPSULE | Freq: Two times a day (BID) | ORAL | Status: DC

## 2013-11-12 MED ORDER — ACETAMINOPHEN 325 MG PO TABS
650.0000 mg | ORAL_TABLET | Freq: Once | ORAL | Status: AC
  Administered 2013-11-12: 650 mg via ORAL
  Filled 2013-11-12: qty 2

## 2013-11-12 MED ORDER — DEXTROSE 5 % IV SOLN
1.0000 g | Freq: Once | INTRAVENOUS | Status: AC
  Administered 2013-11-12: 1 g via INTRAVENOUS
  Filled 2013-11-12: qty 10

## 2013-11-12 MED ORDER — ACETAMINOPHEN 325 MG PO TABS: 650.0000 mg | ORAL_TABLET | Freq: Once | ORAL | Status: DC

## 2013-11-12 NOTE — ED Provider Notes (Signed)
CSN: [id number]     Arrival date & time 11/12/13  1457 History   First MD Initiated Contact with Patient 11/12/13 1505     Chief Complaint  Patient presents with  . Fever     (Consider location/radiation/quality/duration/timing/severity/associated sxs/prior Treatment) HPI Pt is an 80yo female presenting to ED from [facility] with c/o fever since yesterday, Tmax 100.6, no acetaminophen or ibuprofen given PTA.  Pt also states she has had intermittent vertigo with "room spinning" since this weekend, associated with intermittent nausea. Pt originally stated she was not given her meclizine and was unsure why, however, when offered meclizine in ED, pt stated she was given meclizine "first thing this morning."   Pt states acetaminophen is "bad for my liver" but her PCP prefers she be given acetaminophen over ibuprofen as pt has stage IV CKD.  Pt is not on dialysis.  Pt states she "just doesn't feel well" and c/o mild, dull, gradual in onset frontal headache that started after arriving in the ED.  Denies fever, chills, cough, congestion, chest pain, SOB, urinary or vaginal symptoms. Denies neck pain, tremors, seizures, or falls.    Past Medical History  Diagnosis Date  . Osteoporosis   . Depression   . Anxiety   . COPD (chronic obstructive pulmonary disease)   . GERD (gastroesophageal reflux disease)   . Abuse     benzos/narcotics  . Migraines   . Scoliosis   . Insomnia, unspecified   . Vitamin B deficiency   . Weight loss   . Chronic mental illness   . Arterial tortuosity (aorta) 12/11/2012  . Chronic kidney disease, stage IV (severe) 12/11/2012  . Substance abuse     Benzos and Narcotics, she does not get any of those medicines from [facility], we have empahtically told her that.    Past Surgical History  Procedure Laterality Date  . Cholecystectomy    . Partial hysterectomy    . Abdominal hysterectomy     Family History  Problem Relation Age of Onset  . Stroke Mother    . Diabetes Mother   . Cancer Father 11    esophageal   History  Substance Use Topics  . Smoking status: Former Smoker -- 0.50 packs/day    Types: Cigarettes  . Smokeless tobacco: Never Used  . Alcohol Use: No   OB History   Grav Para Term Preterm Abortions TAB SAB Ect Mult Living                 Review of Systems  Constitutional: Positive for fever. Negative for chills.  HENT: Negative for congestion, sore throat and voice change.   Respiratory: Negative for cough and shortness of breath.   Cardiovascular: Negative for chest pain and palpitations.  Gastrointestinal: Positive for nausea. Negative for vomiting and abdominal pain.  Genitourinary: Negative for dysuria, urgency, frequency, hematuria, flank pain, decreased urine volume, vaginal discharge, vaginal pain and pelvic pain.  Musculoskeletal: Positive for back pain ( chronic). Negative for myalgias, neck pain and neck stiffness.  Neurological: Positive for dizziness and headaches ( frontal, dull). Negative for tremors, seizures, syncope, weakness, light-headedness and numbness.  All other systems reviewed and are negative.     Allergies  Macrobid; Azithromycin; Doxycycline; Escitalopram oxalate; Fioricet; Latex; Levofloxacin; Nsaids; Oxycodone; Restasis; Sulfa antibiotics; Tylenol; and Biaxin  Home Medications   Prior to Admission medications   Medication Sig Start Date End Date Taking? Authorizing Provider  aspirin 81 MG chewable tablet Chew 1 tablet (81 mg  total) by mouth daily. 12/12/12  Yes [name], MD  Cholecalciferol (VITAMIN D) 1000 UNITS capsule Take 1,000 Units by mouth daily.   Yes Historical Provider, MD  clorazepate (TRANXENE) 7.5 MG tablet 7.5 mg 3 (three) times daily. 12/23/12  Yes Estill Dooms, MD  diclofenac (FLECTOR) 1.3 % PTCH Place 1 patch onto the skin every 12 (twelve) hours.   Yes Historical Provider, MD  divalproex (DEPAKOTE ER) 250 MG 24 hr tablet Take 250 mg by mouth daily. for mood  disorder   Yes Historical Provider, MD  docusate sodium (COLACE) 100 MG capsule Take 100 mg by mouth 2 (two) times daily.   Yes Historical Provider, MD  dorzolamide-timolol (COSOPT) 22.3-6.8 MG/ML ophthalmic solution Place 1 drop into both eyes 2 (two) times daily.   Yes Historical Provider, MD  fludrocortisone (FLORINEF) 0.1 MG tablet Take 0.1 mg by mouth every morning.   Yes Historical Provider, MD  Ginkgo Biloba 40 MG TABS Take 120 mg by mouth 2 (two) times daily.   Yes Historical Provider, MD  guaiFENesin (ROBITUSSIN) 100 MG/5ML liquid Take 10 mLs by mouth every 6 (six) hours as needed for cough.   Yes Historical Provider, MD  HYDROcodone-acetaminophen (NORCO) 10-325 MG per tablet Take 1 tablet by mouth every 6 (six) hours as needed.   Yes Historical Provider, MD  hydrocortisone (ANUSOL-HC) 2.5 % rectal cream apply twice a day to affected area for hemorrhoids. 08/05/12  Yes Darlyne Russian, MD  hydrOXYzine (ATARAX/VISTARIL) 25 MG tablet Take 25 mg by mouth every 6 (six) hours as needed for anxiety. 06/16/12  Yes Darlyne Russian, MD  levalbuterol [facility] HFA) 45 MCG/ACT inhaler Inhale 2 puffs into the lungs every 4 (four) hours as needed.  08/26/12  Yes Darlyne Russian, MD  meclizine (ANTIVERT) 25 MG tablet Take 25 mg by mouth 3 (three) times daily as needed for dizziness. Do not combine with Hydroxyzine or any other antihistamine. 11/12/13  Yes Thao P Le, DO  Melatonin 1 MG TABS Take 1 tablet by mouth at bedtime.   Yes Historical Provider, MD  nitroGLYCERIN (NITROSTAT) 0.4 MG SL tablet Place 0.4 mg under the tongue every 5 (five) minutes as needed for chest pain. May use 3 times   Yes Historical Provider, MD  ondansetron (ZOFRAN) 4 MG tablet Take 4 mg by mouth every 4 (four) hours as needed for nausea. 12/12/12  Yes [name], MD  Polyethyl Glycol-Propyl Glycol (SYSTANE OP) Apply 1 drop to eye as needed (for dry eyes).   Yes Historical Provider, MD  polyethylene glycol (MIRALAX / GLYCOLAX) packet  Take 17 g by mouth daily.   Yes Historical Provider, MD  Pseudoephedrine-Acetaminophen (CEPACOL SORE THROAT PO) Take 1 tablet by mouth every 2 (two) hours as needed (sore throat).   Yes Historical Provider, MD  QUEtiapine (SEROQUEL XR) 50 MG TB24 24 hr tablet Take 100 mg by mouth daily. Take at 8pm, hold for sedation.   Yes Historical Provider, MD  ranitidine (ZANTAC) 150 MG tablet Take 150 mg by mouth 2 (two) times daily.   Yes Historical Provider, MD  Sennosides (SENNA LAX PO) Take 2 tablets by mouth 2 (two) times daily. Take 2 tablets in the morning and take 2 tablets at bedtime.   Yes Historical Provider, MD  travoprost, benzalkonium, (TRAVATAN) 0.004 % ophthalmic solution Place 1 drop into both eyes at bedtime.   Yes Historical Provider, MD  triazolam (HALCION) 0.25 MG tablet Take two tablets by mouth at bedtime  to help rest 01/20/13  Yes Estill Dooms, MD  cephALEXin (KEFLEX) 500 MG capsule Take 1 capsule (500 mg total) by mouth 2 (two) times daily. 11/12/13   Noland Fordyce, PA-C   BP 137/80  Pulse 83  Temp(Src) 99.4 F (37.4 C) (Rectal)  Resp 16  SpO2 96% Physical Exam  Nursing note and vitals reviewed. Constitutional: She is oriented to person, place, and time. No distress.  Thin appearing elderly female lying in exam bed, NAD  HENT:  Head: Normocephalic and atraumatic.  Eyes: Conjunctivae and EOM are normal. Pupils are equal, round, and reactive to light. No scleral icterus.  Neck: Normal range of motion. Neck supple.  No nuchal rigidity or meningeal signs.  Cardiovascular: Normal rate, regular rhythm and normal heart sounds.   Regular rate and rhythm  Pulmonary/Chest: Effort normal and breath sounds normal. No respiratory distress. She has no wheezes. She has no rales. She exhibits no tenderness.  No respiratory distress, able to speak in full sentences w/o difficulty. Lungs: CTAB  Abdominal: Soft. Bowel sounds are normal. She exhibits no distension and no mass. There is no  tenderness. There is no rebound and no guarding.  Soft, non-distended, non-tender.   Musculoskeletal: Normal range of motion.  Neurological: She is alert and oriented to person, place, and time.  Alert and oriented to person, place and time. FROM all extremities. 5/5 in upper and lower extremities bilaterally. Speech is fluent.  Skin: Skin is warm and dry. She is not diaphoretic.    ED Course  Procedures (including critical care time) Labs Review Labs Reviewed  CBC - Abnormal; Notable for the following:    Hemoglobin 11.9 (*)    All other components within normal limits  BASIC METABOLIC PANEL - Abnormal; Notable for the following:    Sodium 136 (*)    GFR calc non Af Amer 77 (*)    GFR calc Af Amer 89 (*)    All other components within normal limits  URINALYSIS, ROUTINE W REFLEX MICROSCOPIC - Abnormal; Notable for the following:    APPearance CLOUDY (*)    Leukocytes, UA SMALL (*)    All other components within normal limits  URINE MICROSCOPIC-ADD ON - Abnormal; Notable for the following:    Squamous Epithelial / LPF FEW (*)    Bacteria, UA MANY (*)    All other components within normal limits  URINE CULTURE    Imaging Review Dg Chest 2 View  11/12/2013   CLINICAL DATA:  Fever and nausea.  EXAM: CHEST  2 VIEW  COMPARISON:  PA and lateral chest 12/09/2012 and 04/09/2008.  FINDINGS: The chest is hyperexpanded but the lungs are clear. Heart size is normal. No pneumothorax or pleural effusion. Scoliosis is noted.  IMPRESSION: No acute finding.  Hyperexpansion compatible with emphysema.   Electronically Signed   By: Inge Rise M.D.   On: 11/12/2013 16:11     EKG Interpretation None      MDM   Final diagnoses:  UTI (lower urinary tract infection)  Other specified fever  Vertigo    Pt is an 80yo female sent from assisted living facility for further evaluation and treatment of fever, Tmax 100.6 since yesterday.  In ED, oral temp-99.3, rectal-100.7.  Pt states she can  have acetaminophen for fever. Pt received 25mg  meclizine around 12:30PM today, PTA.  Pt is a thin elderly female but neurologically in tact, no focal neuro deficits. Lungs: CTAB. Will get UA, CBC, BMP and CXR.   UA  concerning for UTI with many bacteria but only few squamous cells. Urine culture sent.  Discussed pt with Dr. Zenia Resides who also examined pt.  Pt given IV rocephin and discharged back to assisted living with keflex.  Advised to f/u with PCP in 2-3 days for recheck of symptoms. Return precautions provided. Pt verbalized understanding and agreement with tx plan.    Noland Fordyce, PA-C 11/13/13 [phone]

## 2013-11-12 NOTE — Telephone Encounter (Signed)
Pt daughter states that pt was running a fever and had body aches. Today she is better. If symptoms return or get worse pt needs to RTC- daughter understands.

## 2013-11-12 NOTE — ED Notes (Signed)
Bed: WA09 Expected date:  Expected time:  Means of arrival:  Comments: Ems, 89 fever

## 2013-11-12 NOTE — ED Notes (Signed)
Per EMS pt coming from [facility] assisted [facility] with c/o fever since yesterday, per EMS staff reports pt had temp of 100.6, she wasn't given any tylenol or motrin, denies pain.

## 2013-11-12 NOTE — Telephone Encounter (Signed)
LM that if she feels terrible, fevers and msk aches then needs to return to office or at least be seen by NP who goes to the nursing home, she also needs to get her urine checked out for UTI. She has a hsitory of it.

## 2013-11-12 NOTE — ED Provider Notes (Signed)
Medical screening examination/treatment/procedure(s) were conducted as a shared visit with non-physician practitioner(s) and myself.  I personally evaluated the patient during the encounter.   EKG Interpretation None     Pt here with fever from NH--no reported uri/urinary sx--has chronic pain unchanged from prior,physical exam without findings, awaiting w/u  Leota Jacobsen, MD 11/12/13 [phone]

## 2013-11-14 LAB — URINE CULTURE: Colony Count: >=100000

## 2013-11-16 NOTE — ED Provider Notes (Signed)
Medical screening examination/treatment/procedure(s) were conducted as a shared visit with non-physician practitioner(s) and myself.  I personally evaluated the patient during the encounter.   EKG Interpretation None       Leota Jacobsen, MD 11/16/13 [phone]

## 2013-11-23 ENCOUNTER — Encounter (HOSPITAL_COMMUNITY): Payer: Self-pay | Admitting: Emergency Medicine

## 2013-11-23 ENCOUNTER — Emergency Department (HOSPITAL_COMMUNITY)
Admission: EM | Admit: 2013-11-23 | Discharge: 2013-11-23 | Discharge status: Against Medical Advice | Payer: Medicare Other | Attending: Emergency Medicine | Admitting: Emergency Medicine

## 2013-11-23 ENCOUNTER — Emergency Department (HOSPITAL_COMMUNITY): Payer: Medicare Other

## 2013-11-23 ENCOUNTER — Other Ambulatory Visit (HOSPITAL_COMMUNITY): Payer: Self-pay

## 2013-11-23 DIAGNOSIS — Z8739 Personal history of other diseases of the musculoskeletal system and connective tissue: Secondary | ICD-10-CM | POA: Diagnosis not present

## 2013-11-23 DIAGNOSIS — Z87891 Personal history of nicotine dependence: Secondary | ICD-10-CM | POA: Diagnosis not present

## 2013-11-23 DIAGNOSIS — K219 Gastro-esophageal reflux disease without esophagitis: Secondary | ICD-10-CM | POA: Insufficient documentation

## 2013-11-23 DIAGNOSIS — Z7952 Long term (current) use of systemic steroids: Secondary | ICD-10-CM | POA: Diagnosis not present

## 2013-11-23 DIAGNOSIS — J449 Chronic obstructive pulmonary disease, unspecified: Secondary | ICD-10-CM | POA: Insufficient documentation

## 2013-11-23 DIAGNOSIS — Z79899 Other long term (current) drug therapy: Secondary | ICD-10-CM | POA: Insufficient documentation

## 2013-11-23 DIAGNOSIS — R42 Dizziness and giddiness: Secondary | ICD-10-CM | POA: Diagnosis not present

## 2013-11-23 DIAGNOSIS — Z7982 Long term (current) use of aspirin: Secondary | ICD-10-CM | POA: Diagnosis not present

## 2013-11-23 DIAGNOSIS — G43909 Migraine, unspecified, not intractable, without status migrainosus: Secondary | ICD-10-CM | POA: Diagnosis not present

## 2013-11-23 DIAGNOSIS — N189 Chronic kidney disease, unspecified: Secondary | ICD-10-CM | POA: Insufficient documentation

## 2013-11-23 LAB — CBC WITH DIFFERENTIAL/PLATELET
BASOS PCT: 0 % (ref 0–1)
Basophils Absolute: 0 10*3/uL (ref 0.0–0.1)
Eosinophils Absolute: 0.5 10*3/uL (ref 0.0–0.7)
Eosinophils Relative: 10 % — ABNORMAL HIGH (ref 0–5)
HEMATOCRIT: 37 % (ref 36.0–46.0)
HEMOGLOBIN: 11.8 g/dL — AB (ref 12.0–15.0)
Lymphocytes Relative: 28 % (ref 12–46)
Lymphs Abs: 1.3 10*3/uL (ref 0.7–4.0)
MCH: 28.4 pg (ref 26.0–34.0)
MCHC: 31.9 g/dL (ref 30.0–36.0)
MCV: 89.2 fL (ref 78.0–100.0)
MONO ABS: 0.6 10*3/uL (ref 0.1–1.0)
MONOS PCT: 14 % — AB (ref 3–12)
NEUTROS ABS: 2.2 10*3/uL (ref 1.7–7.7)
NEUTROS PCT: 48 % (ref 43–77)
Platelets: 292 10*3/uL (ref 150–400)
RBC: 4.15 MIL/uL (ref 3.87–5.11)
RDW: 13.3 % (ref 11.5–15.5)
WBC: 4.6 10*3/uL (ref 4.0–10.5)

## 2013-11-23 LAB — URINALYSIS, ROUTINE W REFLEX MICROSCOPIC
Bilirubin Urine: NEGATIVE
GLUCOSE, UA: NEGATIVE mg/dL
HGB URINE DIPSTICK: NEGATIVE
Ketones, ur: NEGATIVE mg/dL
Leukocytes, UA: NEGATIVE
Nitrite: NEGATIVE
PH: 7 (ref 5.0–8.0)
Protein, ur: NEGATIVE mg/dL
Specific Gravity, Urine: 1.011 (ref 1.005–1.030)
Urobilinogen, UA: 0.2 mg/dL (ref 0.0–1.0)

## 2013-11-23 LAB — COMPREHENSIVE METABOLIC PANEL
ALT: 11 U/L (ref 0–35)
ANION GAP: 12 (ref 5–15)
AST: 20 U/L (ref 0–37)
Albumin: 3.2 g/dL — ABNORMAL LOW (ref 3.5–5.2)
Alkaline Phosphatase: 86 U/L (ref 39–117)
BUN: 17 mg/dL (ref 6–23)
CALCIUM: 9.2 mg/dL (ref 8.4–10.5)
CO2: 26 mEq/L (ref 19–32)
CREATININE: 0.77 mg/dL (ref 0.50–1.10)
Chloride: 102 mEq/L (ref 96–112)
GFR calc non Af Amer: 76 mL/min — ABNORMAL LOW (ref >90)
GFR, EST AFRICAN AMERICAN: 88 mL/min — AB (ref >90)
GLUCOSE: 97 mg/dL (ref 70–99)
Potassium: 4.3 mEq/L (ref 3.7–5.3)
SODIUM: 140 meq/L (ref 137–147)
TOTAL PROTEIN: 6.7 g/dL (ref 6.0–8.3)
Total Bilirubin: <0.2 mg/dL — ABNORMAL LOW (ref 0.3–1.2)

## 2013-11-23 LAB — I-STAT TROPONIN, ED: TROPONIN I, POC: 0 ng/mL (ref 0.00–0.08)

## 2013-11-23 MED ORDER — MECLIZINE HCL 25 MG PO TABS
25.0000 mg | ORAL_TABLET | Freq: Once | ORAL | Status: AC
  Administered 2013-11-23: 25 mg via ORAL
  Filled 2013-11-23: qty 1

## 2013-11-23 MED ORDER — HYDROCODONE-ACETAMINOPHEN 5-325 MG PO TABS
1.0000 | ORAL_TABLET | Freq: Once | ORAL | Status: AC
  Administered 2013-11-23: 1 via ORAL
  Filled 2013-11-23: qty 1

## 2013-11-23 NOTE — ED Notes (Signed)
Patient has dressed herself and is ambulating in the hall.  Staff is unable to reason with her.  She is paranoid and reports that she needs to get out of here to be tested to see what medicines we gave her.

## 2013-11-23 NOTE — ED Notes (Signed)
[facility] called for transport.  

## 2013-11-23 NOTE — Discharge Instructions (Signed)
Today you were evaluated for your dizziness. He did not want to get an MRI of her brain. Unfortunately, this is the only way to determine if her dizziness is due to a stroke. By leaving the ER you are putting herself at risk for permanent disability and death. Please followup with your primary care physician for further evaluation of your dizziness.  Dizziness Dizziness is a common problem. It is a feeling of unsteadiness or light-headedness. You may feel like you are about to faint. Dizziness can lead to injury if you stumble or fall. A person of any age group can suffer from dizziness, but dizziness is more common in older adults. CAUSES  Dizziness can be caused by many different things, including:  Middle ear problems.  Standing for too long.  Infections.  An allergic reaction.  Aging.  An emotional response to something, such as the sight of blood.  Side effects of medicines.  Tiredness.  Problems with circulation or blood pressure.  Excessive use of alcohol or medicines, or illegal drug use.  Breathing too fast (hyperventilation).  An irregular heart rhythm (arrhythmia).  A low red blood cell count (anemia).  Pregnancy.  Vomiting, diarrhea, fever, or other illnesses that cause body fluid loss (dehydration).  Diseases or conditions such as Parkinson's disease, high blood pressure (hypertension), diabetes, and thyroid problems.  Exposure to extreme heat. DIAGNOSIS  Your health care provider will ask about your symptoms, perform a physical exam, and perform an electrocardiogram (ECG) to record the electrical activity of your heart. Your health care provider may also perform other heart or blood tests to determine the cause of your dizziness. These may include:  Transthoracic echocardiogram (TTE). During echocardiography, sound waves are used to evaluate how blood flows through your heart.  Transesophageal echocardiogram (TEE).  Cardiac monitoring. This allows your  health care provider to monitor your heart rate and rhythm in real time.  Holter monitor. This is a portable device that records your heartbeat and can help diagnose heart arrhythmias. It allows your health care provider to track your heart activity for several days if needed.  Stress tests by exercise or by giving medicine that makes the heart beat faster. TREATMENT  Treatment of dizziness depends on the cause of your symptoms and can vary greatly. HOME CARE INSTRUCTIONS   Drink enough fluids to keep your urine clear or pale yellow. This is especially important in very hot weather. In older adults, it is also important in cold weather.  Take your medicine exactly as directed if your dizziness is caused by medicines. When taking blood pressure medicines, it is especially important to get up slowly.  Rise slowly from chairs and steady yourself until you feel okay.  In the morning, first sit up on the side of the bed. When you feel okay, stand slowly while holding onto something until you know your balance is fine.  Move your legs often if you need to stand in one place for a long time. Tighten and relax your muscles in your legs while standing.  Have someone stay with you for 1-2 days if dizziness continues to be a problem. Do this until you feel you are well enough to stay alone. Have the person call your health care provider if he or she notices changes in you that are concerning.  Do not drive or use heavy machinery if you feel dizzy.  Do not drink alcohol. SEEK IMMEDIATE MEDICAL CARE IF:   Your dizziness or light-headedness gets worse.  You  feel nauseous or vomit.  You have problems talking, walking, or using your arms, hands, or legs.  You feel weak.  You are not thinking clearly or you have trouble forming sentences. It may take a friend or family member to notice this.  You have chest pain, abdominal pain, shortness of breath, or sweating.  Your vision changes.  You  notice any bleeding.  You have side effects from medicine that seems to be getting worse rather than better. MAKE SURE YOU:   Understand these instructions.  Will watch your condition.  Will get help right away if you are not doing well or get worse. Document Released: 07/25/2000 Document Revised: 02/03/2013 Document Reviewed: 08/18/2010 [facility] Patient Information 2015 [place], [place]. This information is not intended to replace advice given to you by your health care provider. Make sure you discuss any questions you have with your health care provider.

## 2013-11-23 NOTE — ED Provider Notes (Signed)
Patient presented to the ER with dizziness. Patient reports dizziness with movement over the last 5-6 weeks. She was seen at urgent care and told she had vertigo. She reports that she did not improve with the medication (meclizine). Patient reports that she feels fine and she is lying still, is very dizzy when she moves around.  Face to face Exam: HEENT - PERRLA Lungs - CTAB Heart - RRR, no M/R/G Abd - S/NT/ND Neuro - alert, oriented x3  Plan: Workup for stroke. If negative, will need continued outpatient workup (peripheral vertigo versus vertebral basilar insufficiency).   Orpah Greek, MD 11/23/13 [phone]

## 2013-11-23 NOTE — ED Provider Notes (Signed)
CSN: [id number]     Arrival date & time 11/23/13  1540 History   First MD Initiated Contact with Patient 11/23/13 1546     Chief Complaint  Patient presents with  . Dizziness     (Consider location/radiation/quality/duration/timing/severity/associated sxs/prior Treatment) HPI Comments: Patient is an 80 year old female with history of osteoporosis, depression, anxiety, COPD, GERD, migraines, scoliosis, vitamin B deficiency, arterial tortuosity, CKD who presents to the ED today for evaluation of dizziness. Patient reports her symptoms have been ongoing for the past 6 weeks, but have been gradually worsening. Her dizziness is worse when she moves, but does not prevent her from her daily acitivities. She was seen at [facility] for these symptoms and was given meclizine which has not relieved her symptoms.  Patient denies any falls related to this dizziness. No syncope, chest pain, shortness of breath, weakness, numbness, nausea, vomiting, headache.   The history is provided by the patient. No language interpreter was used.    Past Medical History  Diagnosis Date  . Osteoporosis   . Depression   . Anxiety   . COPD (chronic obstructive pulmonary disease)   . GERD (gastroesophageal reflux disease)   . Abuse     benzos/narcotics  . Migraines   . Scoliosis   . Insomnia, unspecified   . Vitamin B deficiency   . Weight loss   . Chronic mental illness   . Arterial tortuosity (aorta) 12/11/2012  . Chronic kidney disease, stage IV (severe) 12/11/2012  . Substance abuse     Benzos and Narcotics, she does not get any of those medicines from [facility], we have empahtically told her that.    Past Surgical History  Procedure Laterality Date  . Cholecystectomy    . Partial hysterectomy    . Abdominal hysterectomy     Family History  Problem Relation Age of Onset  . Stroke Mother   . Diabetes Mother   . Cancer Father 30    esophageal   History  Substance Use Topics  . Smoking status:  Former Smoker -- 0.50 packs/day    Types: Cigarettes  . Smokeless tobacco: Never Used  . Alcohol Use: No   OB History   Grav Para Term Preterm Abortions TAB SAB Ect Mult Living                 Review of Systems  Constitutional: Negative for fever and chills.  Respiratory: Negative for shortness of breath.   Cardiovascular: Negative for chest pain.  Gastrointestinal: Negative for nausea and vomiting.  Neurological: Positive for dizziness. Negative for weakness, numbness and headaches.  All other systems reviewed and are negative.     Allergies  Macrobid; Azithromycin; Doxycycline; Escitalopram oxalate; Fioricet; Latex; Levofloxacin; Nsaids; Oxycodone; Restasis; Sulfa antibiotics; Tylenol; and Biaxin  Home Medications   Prior to Admission medications   Medication Sig Start Date End Date Taking? Authorizing Provider  aspirin 81 MG chewable tablet Chew 1 tablet (81 mg total) by mouth daily. 12/12/12  Yes [name], MD  Cholecalciferol (VITAMIN D) 1000 UNITS capsule Take 1,000 Units by mouth daily.   Yes Historical Provider, MD  clorazepate (TRANXENE) 7.5 MG tablet Take 7.5 mg by mouth 2 (two) times daily.  12/23/12  Yes Estill Dooms, MD  diclofenac (FLECTOR) 1.3 % PTCH Place 1 patch onto the skin every 12 (twelve) hours as needed (pain).    Yes Historical Provider, MD  divalproex (DEPAKOTE) 250 MG DR tablet Take 250 mg by mouth daily.  Yes Historical Provider, MD  docusate sodium (COLACE) 100 MG capsule Take 100 mg by mouth 2 (two) times daily.   Yes Historical Provider, MD  dorzolamide-timolol (COSOPT) 22.3-6.8 MG/ML ophthalmic solution Place 1 drop into both eyes 2 (two) times daily.   Yes Historical Provider, MD  fludrocortisone (FLORINEF) 0.1 MG tablet Take 0.1 mg by mouth every morning.   Yes Historical Provider, MD  Ginkgo Biloba 120 MG CAPS Take 1 capsule by mouth 2 (two) times daily.   Yes Historical Provider, MD  HYDROcodone-acetaminophen (NORCO) 10-325 MG per  tablet Take 1 tablet by mouth every 6 (six) hours as needed (pain).    Yes Historical Provider, MD  hydrocortisone (ANUSOL-HC) 2.5 % rectal cream Place 1 application rectally 2 (two) times daily. For Hemorrhoids   Yes Historical Provider, MD  hydrOXYzine (ATARAX/VISTARIL) 10 MG tablet Take 10 mg by mouth every 6 (six) hours as needed for anxiety (anxiety).   Yes Historical Provider, MD  meclizine (ANTIVERT) 25 MG tablet Take 25 mg by mouth 3 (three) times daily as needed for dizziness (dizziness). Do not combine with Hydroxyzine or any other antihistamine. 11/12/13  Yes Thao P Le, DO  Melatonin 1 MG TABS Take 1 tablet by mouth at bedtime.   Yes Historical Provider, MD  mirtazapine (REMERON) 45 MG tablet Take 45 mg by mouth at bedtime.   Yes Historical Provider, MD  polyethylene glycol (MIRALAX / GLYCOLAX) packet Take 17 g by mouth daily.   Yes Historical Provider, MD  QUEtiapine (SEROQUEL XR) 50 MG TB24 24 hr tablet Take 100 mg by mouth daily. Take at 8pm, hold for sedation.   Yes Historical Provider, MD  ranitidine (ZANTAC) 150 MG tablet Take 150 mg by mouth 2 (two) times daily.   Yes Historical Provider, MD  Sennosides (SENNA LAX PO) Take 2 tablets by mouth 2 (two) times daily. Take 2 tablets in the morning and take 2 tablets at bedtime.   Yes Historical Provider, MD  travoprost, benzalkonium, (TRAVATAN) 0.004 % ophthalmic solution Place 1 drop into both eyes at bedtime.   Yes Historical Provider, MD  triazolam (HALCION) 0.25 MG tablet Take 0.25 mg by mouth at bedtime as needed for sleep (insomnia).   Yes Historical Provider, MD  guaiFENesin (ROBITUSSIN) 100 MG/5ML liquid Take 10 mLs by mouth every 6 (six) hours as needed for cough.    Historical Provider, MD  levalbuterol [facility] HFA) 45 MCG/ACT inhaler Inhale 2 puffs into the lungs every 4 (four) hours as needed for wheezing or shortness of breath.  08/26/12   Darlyne Russian, MD  nitroGLYCERIN (NITROSTAT) 0.4 MG SL tablet Place 0.4 mg under the  tongue every 5 (five) minutes as needed for chest pain. May use 3 times    Historical Provider, MD  ondansetron (ZOFRAN) 4 MG tablet Take 4 mg by mouth every 4 (four) hours as needed for nausea. 12/12/12   [name], MD  Polyethyl Glycol-Propyl Glycol (SYSTANE OP) Apply 1 drop to eye as needed (for dry eyes).    Historical Provider, MD  Pseudoephedrine-Acetaminophen (CEPACOL SORE THROAT PO) Take 1 tablet by mouth every 2 (two) hours as needed (sore throat).    Historical Provider, MD   BP 152/84  Pulse 72  Temp(Src) 98.4 F (36.9 C) (Oral)  Resp 16  SpO2 96% Physical Exam  Nursing note and vitals reviewed. Constitutional: She is oriented to person, place, and time. She appears well-developed and well-nourished. No distress.  Patient appears comfortable laying in bed. Easily  moves herself around in bed.   HENT:  Head: Normocephalic and atraumatic.  Right Ear: Tympanic membrane, external ear and ear canal normal.  Left Ear: Tympanic membrane, external ear and ear canal normal.  Nose: Nose normal.  Mouth/Throat: Oropharynx is clear and moist.  edentulous   Eyes: Conjunctivae and EOM are normal. Pupils are equal, round, and reactive to light.  Neck: Normal range of motion.  Cardiovascular: Normal rate, regular rhythm, normal heart sounds, intact distal pulses and normal pulses.   Pulses:      Radial pulses are 2+ on the right side, and 2+ on the left side.       Posterior tibial pulses are 2+ on the right side, and 2+ on the left side.  Pulmonary/Chest: Effort normal and breath sounds normal. No stridor. No respiratory distress. She has no wheezes. She has no rales.  Abdominal: Soft. She exhibits no distension. There is no tenderness.  Musculoskeletal: Normal range of motion.  Moves all extremities without guarding or ataxia.  Neurological: She is alert and oriented to person, place, and time. She has normal strength. No sensory deficit. Coordination and gait normal. GCS eye  subscore is 4. GCS verbal subscore is 5. GCS motor subscore is 6.  Finger nose finger normal.  Grip strength 5/5 bilaterally.  Strength 5/5 in all extremities. Oriented x 4.   Skin: Skin is warm and dry. She is not diaphoretic. No erythema.  Psychiatric: She has a normal mood and affect. Her behavior is normal.    ED Course  Procedures (including critical care time) Labs Review Labs Reviewed  CBC WITH DIFFERENTIAL - Abnormal; Notable for the following:    Hemoglobin 11.8 (*)    Monocytes Relative 14 (*)    Eosinophils Relative 10 (*)    All other components within normal limits  COMPREHENSIVE METABOLIC PANEL - Abnormal; Notable for the following:    Albumin 3.2 (*)    Total Bilirubin <0.2 (*)    GFR calc non Af Amer 76 (*)    GFR calc Af Amer 88 (*)    All other components within normal limits  URINE CULTURE  URINALYSIS, ROUTINE W REFLEX MICROSCOPIC  I-STAT TROPOININ, ED    Imaging Review Ct Head Wo Contrast  11/23/2013   CLINICAL DATA:  Dizziness for 6 weeks worsening over the last 2 days. Headache.  EXAM: CT HEAD WITHOUT CONTRAST  TECHNIQUE: Contiguous axial images were obtained from the base of the skull through the vertex without intravenous contrast.  COMPARISON:  12/09/2012  FINDINGS: Remote small lacunar infarcts in the right cerebellum, image 10 series 2. Otherwise, the brainstem, cerebellum, cerebral peduncles, thalamus, basal ganglia, basilar cisterns, and ventricular system appear within normal limits. Periventricular white matter and corona radiata hypodensities favor chronic ischemic microvascular white matter disease. No intracranial hemorrhage, mass lesion, or acute CVA.  IMPRESSION: 1. No acute intracranial findings. 2. Periventricular white matter and corona radiata hypodensities favor chronic ischemic microvascular white matter disease.   Electronically Signed   By: Sherryl Barters M.D.   On: 11/23/2013 17:21     EKG Interpretation   Date/Time:  Monday November 23 2013 17:47:59 EDT Ventricular Rate:  75 PR Interval:  132 QRS Duration: 94 QT Interval:  387 QTC Calculation: 432 R Axis:   -33 Text Interpretation:  Sinus rhythm Left axis deviation Abnormal R-wave  progression, early transition RSR' or QR pattern in V1 suggests right  ventricular conduction delay Compared to previous tracing RSR prime NOW  PRESENT  Confirmed by [facility]  MD, Jenny Reichmann ([id number]) on 11/23/2013 6:08:23 PM      MDM   Final diagnoses:  Dizziness   Patient presents to ED for evaluation of dizziness x 6 weeks. She has taken meclizine without relief of her symptoms. Labs and imaging unremarkable. Patient refused MRI. Discussed with patient MRI was necessary to rule out stroke as the cause of her dizziness. Patient adamant that she will follow up with her PCP and get MRI as an outpatient. Patient does not want to stay. Discussed risks of leaving against medical advice including permanent disability and death. Patient voices understanding. She is competent to make her own decisions. Encouraged patient to return to ED if she changes her mind. Dr. Betsey Holiday evaluated patient and agrees with plan.   Elwyn Lade, PA-C 11/24/13 1309

## 2013-11-23 NOTE — ED Notes (Signed)
Bed: WA21 Expected date:  Expected time:  Means of arrival:  Comments: EMS  

## 2013-11-23 NOTE — ED Notes (Signed)
Patient transported to CT 

## 2013-11-23 NOTE — ED Notes (Signed)
Per [facility]- Pt is a FULL CODE- Pt resides at [facility]. Pt presents with NAD. Pt c/o of dizziness for 6 weeks however states increased over the last 2 days. Denies CP and SOB. Denies N/V/D and fever. Denies any falls. Upon arrival to room pt was walking in room packing her suitcase. HX vertigo. Just treated for it at [facility] urgent care 3 weeks ago and medication did not help.

## 2013-11-23 NOTE — ED Notes (Signed)
Attempted to get urine. Pt unable to go. Pt states may have to have in and out

## 2013-11-24 LAB — URINE CULTURE
Colony Count: NO GROWTH
Culture: NO GROWTH

## 2013-11-26 NOTE — ED Provider Notes (Signed)
Medical screening examination/treatment/procedure(s) were conducted as a shared visit with non-physician practitioner(s) and myself.  I personally evaluated the patient during the encounter.  Please see separate associated note for evaluation and plan.    EKG Interpretation   Date/Time:  Monday November 23 2013 17:47:59 EDT Ventricular Rate:  75 PR Interval:  132 QRS Duration: 94 QT Interval:  387 QTC Calculation: 432 R Axis:   -33 Text Interpretation:  Sinus rhythm Left axis deviation Abnormal R-wave  progression, early transition RSR' or QR pattern in V1 suggests right  ventricular conduction delay Compared to previous tracing RSR prime NOW  PRESENT Confirmed by [facility]  MD, Jenny Reichmann ([id number]) on 11/23/2013 6:08:23 PM  Also confirmed by [facility]  MD, Jenny Reichmann ([id number]), editor [place]  CCT,  BEVERLY ([id number])  on 11/24/2013 8:00:49 AM       Orpah Greek, MD 11/26/13 [phone]

## 2014-03-04 ENCOUNTER — Encounter: Payer: Self-pay | Admitting: Family Medicine

## 2014-03-04 DIAGNOSIS — H905 Unspecified sensorineural hearing loss: Secondary | ICD-10-CM | POA: Insufficient documentation

## 2014-03-04 DIAGNOSIS — J3489 Other specified disorders of nose and nasal sinuses: Secondary | ICD-10-CM | POA: Insufficient documentation

## 2014-03-04 HISTORY — DX: Unspecified sensorineural hearing loss: H90.5

## 2014-03-06 ENCOUNTER — Emergency Department (HOSPITAL_COMMUNITY)
Admission: EM | Admit: 2014-03-06 | Discharge: 2014-03-07 | Disposition: A | Discharge status: Home / Self Care | Payer: Medicare Other | Attending: Emergency Medicine | Admitting: Emergency Medicine

## 2014-03-06 ENCOUNTER — Emergency Department (HOSPITAL_COMMUNITY): Payer: Medicare Other

## 2014-03-06 ENCOUNTER — Encounter (HOSPITAL_COMMUNITY): Payer: Self-pay | Admitting: Emergency Medicine

## 2014-03-06 DIAGNOSIS — Y92009 Unspecified place in unspecified non-institutional (private) residence as the place of occurrence of the external cause: Secondary | ICD-10-CM | POA: Insufficient documentation

## 2014-03-06 DIAGNOSIS — M419 Scoliosis, unspecified: Secondary | ICD-10-CM | POA: Diagnosis not present

## 2014-03-06 DIAGNOSIS — Z7952 Long term (current) use of systemic steroids: Secondary | ICD-10-CM | POA: Diagnosis not present

## 2014-03-06 DIAGNOSIS — J449 Chronic obstructive pulmonary disease, unspecified: Secondary | ICD-10-CM | POA: Diagnosis not present

## 2014-03-06 DIAGNOSIS — Y998 Other external cause status: Secondary | ICD-10-CM | POA: Insufficient documentation

## 2014-03-06 DIAGNOSIS — Z8679 Personal history of other diseases of the circulatory system: Secondary | ICD-10-CM | POA: Diagnosis not present

## 2014-03-06 DIAGNOSIS — N184 Chronic kidney disease, stage 4 (severe): Secondary | ICD-10-CM | POA: Insufficient documentation

## 2014-03-06 DIAGNOSIS — W1839XA Other fall on same level, initial encounter: Secondary | ICD-10-CM | POA: Diagnosis not present

## 2014-03-06 DIAGNOSIS — F419 Anxiety disorder, unspecified: Secondary | ICD-10-CM | POA: Insufficient documentation

## 2014-03-06 DIAGNOSIS — G43909 Migraine, unspecified, not intractable, without status migrainosus: Secondary | ICD-10-CM | POA: Insufficient documentation

## 2014-03-06 DIAGNOSIS — K219 Gastro-esophageal reflux disease without esophagitis: Secondary | ICD-10-CM | POA: Insufficient documentation

## 2014-03-06 DIAGNOSIS — G4700 Insomnia, unspecified: Secondary | ICD-10-CM | POA: Insufficient documentation

## 2014-03-06 DIAGNOSIS — Z7982 Long term (current) use of aspirin: Secondary | ICD-10-CM | POA: Diagnosis not present

## 2014-03-06 DIAGNOSIS — S3992XA Unspecified injury of lower back, initial encounter: Secondary | ICD-10-CM | POA: Diagnosis not present

## 2014-03-06 DIAGNOSIS — G8929 Other chronic pain: Secondary | ICD-10-CM | POA: Diagnosis not present

## 2014-03-06 DIAGNOSIS — Z8639 Personal history of other endocrine, nutritional and metabolic disease: Secondary | ICD-10-CM | POA: Diagnosis not present

## 2014-03-06 DIAGNOSIS — Y9389 Activity, other specified: Secondary | ICD-10-CM | POA: Insufficient documentation

## 2014-03-06 DIAGNOSIS — S3991XA Unspecified injury of abdomen, initial encounter: Secondary | ICD-10-CM | POA: Diagnosis not present

## 2014-03-06 DIAGNOSIS — Z87891 Personal history of nicotine dependence: Secondary | ICD-10-CM | POA: Diagnosis not present

## 2014-03-06 DIAGNOSIS — Z79899 Other long term (current) drug therapy: Secondary | ICD-10-CM | POA: Insufficient documentation

## 2014-03-06 DIAGNOSIS — Z91040 Latex allergy status: Secondary | ICD-10-CM | POA: Insufficient documentation

## 2014-03-06 DIAGNOSIS — F329 Major depressive disorder, single episode, unspecified: Secondary | ICD-10-CM | POA: Insufficient documentation

## 2014-03-06 DIAGNOSIS — W19XXXA Unspecified fall, initial encounter: Secondary | ICD-10-CM

## 2014-03-06 LAB — CBC WITH DIFFERENTIAL/PLATELET
Basophils Absolute: 0 10*3/uL (ref 0.0–0.1)
Basophils Relative: 1 % (ref 0–1)
Eosinophils Absolute: 0.2 10*3/uL (ref 0.0–0.7)
Eosinophils Relative: 5 % (ref 0–5)
HCT: 38 % (ref 36.0–46.0)
Hemoglobin: 12 g/dL (ref 12.0–15.0)
Lymphocytes Relative: 40 % (ref 12–46)
Lymphs Abs: 1.7 10*3/uL (ref 0.7–4.0)
MCH: 28.6 pg (ref 26.0–34.0)
MCHC: 31.6 g/dL (ref 30.0–36.0)
MCV: 90.5 fL (ref 78.0–100.0)
Monocytes Absolute: 0.5 10*3/uL (ref 0.1–1.0)
Monocytes Relative: 12 % (ref 3–12)
Neutro Abs: 1.8 10*3/uL (ref 1.7–7.7)
Neutrophils Relative %: 42 % — ABNORMAL LOW (ref 43–77)
Platelets: 152 10*3/uL (ref 150–400)
RBC: 4.2 MIL/uL (ref 3.87–5.11)
RDW: 15.1 % (ref 11.5–15.5)
WBC: 4.2 10*3/uL (ref 4.0–10.5)

## 2014-03-06 NOTE — ED Notes (Addendum)
Pt arrived via EMS with report of pt ambulating to bathroom and slipped and fell in urine. Reported hitting head but unknown to what she hit it on.  C/O lt hip, lt shoulder and lt flank which are chronic pain. No hematoma/bruising noted to head, lt shoulder and lt flank area. Denies spinal pain. Pt reported she was able to bear weight to lt hip. No inner/outer rotation or leg length discrepancies noted. (+)PMS and CRT brisk.

## 2014-03-06 NOTE — ED Provider Notes (Signed)
CSN: [id number]     Arrival date & time 03/06/14  2300 History   First MD Initiated Contact with Patient 03/06/14 2309     Chief Complaint  Patient presents with  . Fall     (Consider location/radiation/quality/duration/timing/severity/associated sxs/prior Treatment) HPI 81 year old female presents to emergency department via EMS after a fall at home.  Patient reports that she had urgent need to use the bathroom, and did not make it in time and in removing her pull-ups splashed urine on the floor.  She then slipped in the urine, landing on her side.  Patient reports she hit her head but did not pass out.  Patient complaining of back pain which she reports is chronic.  Patient was able to stand after the injury.  She is not on any blood thinners.  Patient denies any urinary symptoms, no nausea, vomiting, chest pain or shortness of breath.  She reports she has not been eating or drinking well and last few days, but she is unclear as to why this is. Past Medical History  Diagnosis Date  . Osteoporosis   . Depression   . Anxiety   . COPD (chronic obstructive pulmonary disease)   . GERD (gastroesophageal reflux disease)   . Abuse     benzos/narcotics  . Migraines   . Scoliosis   . Insomnia, unspecified   . Vitamin B deficiency   . Weight loss   . Chronic mental illness   . Arterial tortuosity (aorta) 12/11/2012  . Chronic kidney disease, stage IV (severe) 12/11/2012  . Substance abuse     Benzos and Narcotics, she does not get any of those medicines from [facility], we have empahtically told her that.    Past Surgical History  Procedure Laterality Date  . Cholecystectomy    . Partial hysterectomy    . Abdominal hysterectomy    . Cataract extraction     Family History  Problem Relation Age of Onset  . Stroke Mother   . Diabetes Mother   . Cancer Father 52    esophageal  . Heart disease Mother   . Hearing loss Mother    History  Substance Use Topics  . Smoking status: Former  Smoker -- 0.50 packs/day    Types: Cigarettes  . Smokeless tobacco: Never Used  . Alcohol Use: No   OB History    No data available     Review of Systems   See History of Present Illness; otherwise all other systems are reviewed and negative  Allergies  Macrobid; Azithromycin; Doxycycline; Escitalopram oxalate; Fioricet; Latex; Levofloxacin; Nsaids; Oxycodone; Restasis; Sulfa antibiotics; Tylenol; and Biaxin  Home Medications   Prior to Admission medications   Medication Sig Start Date End Date Taking? Authorizing Provider  aspirin 81 MG chewable tablet Chew 1 tablet (81 mg total) by mouth daily. 12/12/12   [name], MD  Cholecalciferol (VITAMIN D) 1000 UNITS capsule Take 1,000 Units by mouth daily.    Historical Provider, MD  clorazepate (TRANXENE) 7.5 MG tablet Take 7.5 mg by mouth 2 (two) times daily.  12/23/12   Estill Dooms, MD  diclofenac (FLECTOR) 1.3 % PTCH Place 1 patch onto the skin every 12 (twelve) hours as needed (pain).     Historical Provider, MD  divalproex (DEPAKOTE) 250 MG DR tablet Take 250 mg by mouth daily.    Historical Provider, MD  docusate sodium (COLACE) 100 MG capsule Take 100 mg by mouth 2 (two) times daily.    Historical Provider, MD  dorzolamide-timolol (COSOPT) 22.3-6.8 MG/ML ophthalmic solution Place 1 drop into both eyes 2 (two) times daily.    Historical Provider, MD  fludrocortisone (FLORINEF) 0.1 MG tablet Take 0.1 mg by mouth every morning.    Historical Provider, MD  Ginkgo Biloba 120 MG CAPS Take 1 capsule by mouth 2 (two) times daily.    Historical Provider, MD  guaiFENesin (ROBITUSSIN) 100 MG/5ML liquid Take 10 mLs by mouth every 6 (six) hours as needed for cough.    Historical Provider, MD  HYDROcodone-acetaminophen (NORCO) 10-325 MG per tablet Take 1 tablet by mouth every 6 (six) hours as needed (pain).     Historical Provider, MD  hydrocortisone (ANUSOL-HC) 2.5 % rectal cream Place 1 application rectally 2 (two) times daily. For  Hemorrhoids    Historical Provider, MD  hydrOXYzine (ATARAX/VISTARIL) 10 MG tablet Take 10 mg by mouth every 6 (six) hours as needed for anxiety (anxiety).    Historical Provider, MD  levalbuterol [facility] HFA) 45 MCG/ACT inhaler Inhale 2 puffs into the lungs every 4 (four) hours as needed for wheezing or shortness of breath.  08/26/12   Darlyne Russian, MD  meclizine (ANTIVERT) 25 MG tablet Take 25 mg by mouth 3 (three) times daily as needed for dizziness (dizziness). Do not combine with Hydroxyzine or any other antihistamine. 11/12/13   Thao P Le, DO  Melatonin 1 MG TABS Take 1 tablet by mouth at bedtime.    Historical Provider, MD  mirtazapine (REMERON) 45 MG tablet Take 45 mg by mouth at bedtime.    Historical Provider, MD  nitroGLYCERIN (NITROSTAT) 0.4 MG SL tablet Place 0.4 mg under the tongue every 5 (five) minutes as needed for chest pain. May use 3 times    Historical Provider, MD  ondansetron (ZOFRAN) 4 MG tablet Take 4 mg by mouth every 4 (four) hours as needed for nausea. 12/12/12   [name], MD  Polyethyl Glycol-Propyl Glycol (SYSTANE OP) Apply 1 drop to eye as needed (for dry eyes).    Historical Provider, MD  polyethylene glycol (MIRALAX / GLYCOLAX) packet Take 17 g by mouth daily.    Historical Provider, MD  Pseudoephedrine-Acetaminophen (CEPACOL SORE THROAT PO) Take 1 tablet by mouth every 2 (two) hours as needed (sore throat).    Historical Provider, MD  QUEtiapine (SEROQUEL XR) 50 MG TB24 24 hr tablet Take 100 mg by mouth daily. Take at 8pm, hold for sedation.    Historical Provider, MD  ranitidine (ZANTAC) 150 MG tablet Take 150 mg by mouth 2 (two) times daily.    Historical Provider, MD  Sennosides (SENNA LAX PO) Take 2 tablets by mouth 2 (two) times daily. Take 2 tablets in the morning and take 2 tablets at bedtime.    Historical Provider, MD  travoprost, benzalkonium, (TRAVATAN) 0.004 % ophthalmic solution Place 1 drop into both eyes at bedtime.    Historical Provider, MD   triazolam (HALCION) 0.25 MG tablet Take 0.25 mg by mouth at bedtime as needed for sleep (insomnia).    Historical Provider, MD   BP 152/88 mmHg  Pulse 60  Temp(Src) 97.7 F (36.5 C) (Oral)  Resp 18  Ht 5' (1.524 m)  Wt 111 lb (50.349 kg)  BMI 21.68 kg/m2  SpO2 98% Physical Exam  Constitutional: She is oriented to person, place, and time. She appears well-developed and well-nourished.  Frail, elderly female, no acute distress.  Flat affect  HENT:  Head: Normocephalic and atraumatic.  Right Ear: External ear normal.  Left Ear:  External ear normal.  Nose: Nose normal.  Mouth/Throat: Oropharynx is clear and moist. No oropharyngeal exudate.  Eyes: Conjunctivae and EOM are normal. Pupils are equal, round, and reactive to light.  Neck: Normal range of motion. Neck supple. No JVD present. No tracheal deviation present. No thyromegaly present.  Cardiovascular: Normal rate, regular rhythm, normal heart sounds and intact distal pulses.  Exam reveals no gallop and no friction rub.   No murmur heard. Pulmonary/Chest: Effort normal and breath sounds normal. No stridor. No respiratory distress. She has no wheezes. She has no rales. She exhibits no tenderness.  Abdominal: Soft. Bowel sounds are normal. She exhibits no distension and no mass. There is tenderness (mild diffuse tenderness). There is no rebound and no guarding.  Musculoskeletal: Normal range of motion. She exhibits no edema or tenderness.  Lymphadenopathy:    She has no cervical adenopathy.  Neurological: She is alert and oriented to person, place, and time. She displays normal reflexes. No cranial nerve deficit. She exhibits normal muscle tone. Coordination normal.  Skin: Skin is warm and dry. No rash noted. No erythema. No pallor.  Psychiatric: She has a normal mood and affect. Her behavior is normal. Judgment and thought content normal.  Nursing note and vitals reviewed.   ED Course  Procedures (including critical care  time) Labs Review Labs Reviewed  CBC WITH DIFFERENTIAL/PLATELET - Abnormal; Notable for the following:    Neutrophils Relative % 42 (*)    All other components within normal limits  BASIC METABOLIC PANEL - Abnormal; Notable for the following:    Glucose, Bld 106 (*)    GFR calc non Af Amer 79 (*)    All other components within normal limits  URINE CULTURE  URINALYSIS, ROUTINE W REFLEX MICROSCOPIC    Imaging Review Ct Head Wo Contrast  03/07/2014   CLINICAL DATA:  Fall at home.  Head injury.  Initial encounter.  EXAM: CT HEAD WITHOUT CONTRAST  CT CERVICAL SPINE WITHOUT CONTRAST  TECHNIQUE: Multidetector CT imaging of the head and cervical spine was performed following the standard protocol without intravenous contrast. Multiplanar CT image reconstructions of the cervical spine were also generated.  COMPARISON:  11/23/2013 head CT  FINDINGS: CT HEAD FINDINGS  Skull and Sinuses:Deformity of the scalp at the vertex could be folds, scarring, or less likely acute laceration. No underlying fracture.  Orbits: Bilateral cataract resection.  No traumatic findings.  Brain: No evidence of acute infarction, hemorrhage, hydrocephalus, or mass lesion/mass effect. There is patchy bilateral cerebral white matter low density consistent with chronic small vessel disease. The pattern is stable from 2015. 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  CT CERVICAL SPINE FINDINGS  Status post C3-4, C5-6, and C6-7 discectomy and fusion. Bony fusion is complete. There is 3 mm anterolisthesis at C4-5 which is chronic based on brain MRI imaging from 2011 which shows slip, although less extensive. This has been accelerated by adjacent level effusions and advanced facet osteoarthritis. No gross cervical canal hematoma or prevertebral edema. No acute fracture. Extensive ligament ossification around the dens, without erosion.  IMPRESSION: 1. No acute intracranial injury or cervical spine fracture. 2. Multilevel spinal  fusion with advanced anterolisthesis and facet arthritis at C4-5.   Electronically Signed   By: Jorje Guild M.D.   On: 03/07/2014 00:03   Ct Cervical Spine Wo Contrast  03/07/2014   CLINICAL DATA:  Fall at home.  Head injury.  Initial encounter.  EXAM: CT HEAD WITHOUT CONTRAST  CT CERVICAL SPINE  WITHOUT CONTRAST  TECHNIQUE: Multidetector CT imaging of the head and cervical spine was performed following the standard protocol without intravenous contrast. Multiplanar CT image reconstructions of the cervical spine were also generated.  COMPARISON:  11/23/2013 head CT  FINDINGS: CT HEAD FINDINGS  Skull and Sinuses:Deformity of the scalp at the vertex could be folds, scarring, or less likely acute laceration. No underlying fracture.  Orbits: Bilateral cataract resection.  No traumatic findings.  Brain: No evidence of acute infarction, hemorrhage, hydrocephalus, or mass lesion/mass effect. There is patchy bilateral cerebral white matter low density consistent with chronic small vessel disease. The pattern is stable from 2015. 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  CT CERVICAL SPINE FINDINGS  Status post C3-4, C5-6, and C6-7 discectomy and fusion. Bony fusion is complete. There is 3 mm anterolisthesis at C4-5 which is chronic based on brain MRI imaging from 2011 which shows slip, although less extensive. This has been accelerated by adjacent level effusions and advanced facet osteoarthritis. No gross cervical canal hematoma or prevertebral edema. No acute fracture. Extensive ligament ossification around the dens, without erosion.  IMPRESSION: 1. No acute intracranial injury or cervical spine fracture. 2. Multilevel spinal fusion with advanced anterolisthesis and facet arthritis at C4-5.   Electronically Signed   By: Jorje Guild M.D.   On: 03/07/2014 00:03     EKG Interpretation None      MDM   Final diagnoses:  Fall at home, initial encounter   81 year old female with what appears to  be a mechanical fall.  We'll check some baseline labs in urine.  Given that she had acute urgency to go.  Will CT head and C-spine given fall.  Patient was normal range of motion of extremities, chronic back pain complaints.    Kalman Drape, MD 03/07/14 [phone]

## 2014-03-06 NOTE — ED Notes (Signed)
Bed: WA03 Expected date:  Expected time:  Means of arrival:  Comments: 66F fall slipped in urine

## 2014-03-07 LAB — BASIC METABOLIC PANEL
Anion gap: 5 (ref 5–15)
BUN: 19 mg/dL (ref 6–23)
CO2: 29 mmol/L (ref 19–32)
Calcium: 8.9 mg/dL (ref 8.4–10.5)
Chloride: 107 mmol/L (ref 96–112)
Creatinine, Ser: 0.68 mg/dL (ref 0.50–1.10)
GFR calc Af Amer: >90 mL/min (ref >90)
GFR calc non Af Amer: 79 mL/min — ABNORMAL LOW (ref >90)
GLUCOSE: 106 mg/dL — AB (ref 70–99)
POTASSIUM: 3.8 mmol/L (ref 3.5–5.1)
Sodium: 141 mmol/L (ref 135–145)

## 2014-03-07 LAB — URINALYSIS, ROUTINE W REFLEX MICROSCOPIC
BILIRUBIN URINE: NEGATIVE
GLUCOSE, UA: NEGATIVE mg/dL
Hgb urine dipstick: NEGATIVE
Ketones, ur: NEGATIVE mg/dL
Leukocytes, UA: NEGATIVE
Nitrite: NEGATIVE
Protein, ur: NEGATIVE mg/dL
SPECIFIC GRAVITY, URINE: 1.009 (ref 1.005–1.030)
UROBILINOGEN UA: 0.2 mg/dL (ref 0.0–1.0)
pH: 7 (ref 5.0–8.0)

## 2014-03-07 NOTE — ED Notes (Signed)
Called communication for [facility] transport back to [facility]F.

## 2014-03-07 NOTE — ED Notes (Signed)
Report called to [place] at [facility]

## 2014-03-07 NOTE — ED Notes (Signed)
Pt ambulated to BR with limited assist. Pt has steady gait. Denies dizziness/lightheadedness while walking.

## 2014-03-07 NOTE — ED Notes (Signed)
Resting quietly with eye closed. Easily arousable. Verbally responsive. Resp even and unlabored. ABC's intact. NAD noted.  

## 2014-03-07 NOTE — ED Notes (Signed)
Awake. Verbally responsive. A/O x4. Resp even and unlabored. No audible adventitious breath sounds noted. ABC's intact.  

## 2014-03-07 NOTE — Discharge Instructions (Signed)
Your evaluation in the ER today after your fall did not show any broken bones or serious injury.    Follow up with your doctor for recheck in 2-3 days.  Return to the ER for worsening condition or new concerning symptoms.  FALL PREVENTION (EDU)  FALL PREVENTION (EDU): You have requested information on Fall Prevention.  According to a 2003 study from the Journal of the [employer], more than 1.8 million adults, aged 79 and older, were treated in emergency departments for fall-related injuries. More than 421,000 were hospitalized. The most common injuries from a fall are head injuries that in turn cause a brain injury, and fractures (broken bones). Of all types of broken bones that happen from falls, hip fractures are the most serious and lead to the greatest number of health problems and deaths.  To make your living area safer, older adults should consider the following:      Improve lighting throughout the home. Use night-lights to help you see at night.     Have handrails installed on both sides of stairways.     Have grab bars placed next to the toilet and in the shower. Also consider an elevated toilet seat and a shower chair.     Use non-slip bath mats in the tub or shower.     Remove "throw rugs" to prevent tripping.     Avoid the use of long robes to prevent tripping.     Wear well-fitted shoes or slippers. Wearing loose footwear can cause you to shuffle, and make you more likely to trip and fall. Inexpensive anti-slip socks can also be purchased.     Be sure to keep all electrical cords and small objects out of the pathway.     If a cane, walker or any other assistive device is used, be sure to have them inspected regularly. The devices must be used correctly to prevent injuries.     Remember to move about at a pace that is comfortable for your ability. For example, do not rush to answer the doorbell or the phone. Take your time. In recent studies, a number of  risk factors have been identified that make older adults more likely to have falls. It has also been shown that when these risk factors are modified, it will help to prevent falls.      Exercise: Regular physical activity or exercise increases body strength and improves balance.     Medication Review: Follow up with your doctor and pharmacist as needed to review your medications and any recent changes that may have been made. They can tell you if there are drug interactions and side-effects. If you are taking any sedatives or sleeping pills, it might be possible to have the dosage decreased, or have the number of medications reduced. These kinds of medications can cause drowsiness and dizziness, thus posing a risk of falling.     Vision Checks: Follow up with an eye doctor at least once a year to have your vision checked.  If you develop symptoms of Shortness of Breath, Chest Pain, Swelling of lips, mouth or tongue or if your condition becomes worse with any new symptoms, see your doctor or return to the Emergency Department for immediate care. Emergency services are not intended to be a substitute for comprehensive medical attention.  Please contact your doctor for follow up if not improving as expected.   Call your doctor in 5-7 days or as directed if there is no improvement.  Community Resources: *IF YOU ARE IN IMMEDIATE DANGER CALL 911!  Abuse/Neglect:  Family Services Crisis Hotline [facility]): 463 115 8914 [facility]): (913)549-3919  After hours, holidays and weekends: 623-240-7948 National Domestic Violence Hotline: [place]: [place]: Dannielle Karvonen: (567)372-3693  Health Clinics:  Urgent [place] ([place]): [phone] Monday - Friday 8 AM - 9 PM, Saturday and Sunday 10 AM - 9 PM  [facility] [place]: [phone] Monday - Friday 8 AM - 5 PM  [facility]    [address]: [phone] Monday- Friday 8:30 AM - 5:30 PM, Sat 9 AM - 1 PM  24 HR [facility] [address]: [phone] [facility] [address]: [phone] [facility] [address]: [phone]  24 [facility]: [address] [phone]  Cultures: If culture results are positive, we will notify you if a change in treatment is necessary.  LABORATORY TESTS:         If you had any labs drawn in the ED that have not resulted by the time you are discharged home, we will review these lab results and the treatment given to you.  If there is any further treatment or notification needed, we will contact you by phone, or letter.  "PLEASE ENSURE THAT YOU HAVE GIVEN US YOUR CURRENT WORKING PHONE NUMBER AND YOUR CURRENT ADDRESS, so that we can contact you if needed."  RADIOLOGY TESTS:  If the referred physician wants todays x-rays, please call the hospitals Radiology Department the day before your doctors appointment. [place]     [phone]    [phone] [place]     [phone]  Our doctors and staff appreciate your choosing [place] for your emergency medical care needs. We are here to serve you

## 2014-03-07 NOTE — ED Notes (Signed)
Pt ambulated to bathroom to void. Gait steady. Denies pain.

## 2014-03-08 LAB — URINE CULTURE
COLONY COUNT: NO GROWTH
Culture: NO GROWTH

## 2014-03-29 ENCOUNTER — Inpatient Hospital Stay (HOSPITAL_COMMUNITY)
Admission: EM | Admit: 2014-03-29 | Discharge: 2014-04-03 | DRG: 154 | Disposition: A | Discharge status: Home Health | Payer: Medicare Other | Attending: Internal Medicine | Admitting: Internal Medicine

## 2014-03-29 ENCOUNTER — Encounter (HOSPITAL_COMMUNITY): Payer: Self-pay | Admitting: Emergency Medicine

## 2014-03-29 DIAGNOSIS — Z79891 Long term (current) use of opiate analgesic: Secondary | ICD-10-CM

## 2014-03-29 DIAGNOSIS — I129 Hypertensive chronic kidney disease with stage 1 through stage 4 chronic kidney disease, or unspecified chronic kidney disease: Secondary | ICD-10-CM | POA: Diagnosis present

## 2014-03-29 DIAGNOSIS — S5012XA Contusion of left forearm, initial encounter: Secondary | ICD-10-CM | POA: Diagnosis present

## 2014-03-29 DIAGNOSIS — F419 Anxiety disorder, unspecified: Secondary | ICD-10-CM | POA: Diagnosis present

## 2014-03-29 DIAGNOSIS — R42 Dizziness and giddiness: Secondary | ICD-10-CM | POA: Diagnosis not present

## 2014-03-29 DIAGNOSIS — M810 Age-related osteoporosis without current pathological fracture: Secondary | ICD-10-CM | POA: Diagnosis present

## 2014-03-29 DIAGNOSIS — Z800 Family history of malignant neoplasm of digestive organs: Secondary | ICD-10-CM

## 2014-03-29 DIAGNOSIS — W19XXXA Unspecified fall, initial encounter: Secondary | ICD-10-CM

## 2014-03-29 DIAGNOSIS — Z87891 Personal history of nicotine dependence: Secondary | ICD-10-CM

## 2014-03-29 DIAGNOSIS — Z681 Body mass index (BMI) 19 or less, adult: Secondary | ICD-10-CM

## 2014-03-29 DIAGNOSIS — S022XXA Fracture of nasal bones, initial encounter for closed fracture: Secondary | ICD-10-CM | POA: Diagnosis not present

## 2014-03-29 DIAGNOSIS — Z881 Allergy status to other antibiotic agents status: Secondary | ICD-10-CM

## 2014-03-29 DIAGNOSIS — M419 Scoliosis, unspecified: Secondary | ICD-10-CM | POA: Diagnosis present

## 2014-03-29 DIAGNOSIS — H3530 Unspecified macular degeneration: Secondary | ICD-10-CM | POA: Diagnosis present

## 2014-03-29 DIAGNOSIS — Z822 Family history of deafness and hearing loss: Secondary | ICD-10-CM

## 2014-03-29 DIAGNOSIS — I161 Hypertensive emergency: Secondary | ICD-10-CM | POA: Diagnosis present

## 2014-03-29 DIAGNOSIS — N184 Chronic kidney disease, stage 4 (severe): Secondary | ICD-10-CM | POA: Diagnosis present

## 2014-03-29 DIAGNOSIS — E539 Vitamin B deficiency, unspecified: Secondary | ICD-10-CM | POA: Diagnosis present

## 2014-03-29 DIAGNOSIS — Z91040 Latex allergy status: Secondary | ICD-10-CM

## 2014-03-29 DIAGNOSIS — E43 Unspecified severe protein-calorie malnutrition: Secondary | ICD-10-CM | POA: Diagnosis present

## 2014-03-29 DIAGNOSIS — W1839XA Other fall on same level, initial encounter: Secondary | ICD-10-CM | POA: Diagnosis present

## 2014-03-29 DIAGNOSIS — Z79899 Other long term (current) drug therapy: Secondary | ICD-10-CM

## 2014-03-29 DIAGNOSIS — Y92129 Unspecified place in nursing home as the place of occurrence of the external cause: Secondary | ICD-10-CM

## 2014-03-29 DIAGNOSIS — Z833 Family history of diabetes mellitus: Secondary | ICD-10-CM

## 2014-03-29 DIAGNOSIS — K219 Gastro-esophageal reflux disease without esophagitis: Secondary | ICD-10-CM | POA: Diagnosis present

## 2014-03-29 DIAGNOSIS — Z8249 Family history of ischemic heart disease and other diseases of the circulatory system: Secondary | ICD-10-CM

## 2014-03-29 DIAGNOSIS — J449 Chronic obstructive pulmonary disease, unspecified: Secondary | ICD-10-CM | POA: Diagnosis present

## 2014-03-29 DIAGNOSIS — H547 Unspecified visual loss: Secondary | ICD-10-CM | POA: Diagnosis present

## 2014-03-29 DIAGNOSIS — N390 Urinary tract infection, site not specified: Secondary | ICD-10-CM | POA: Diagnosis present

## 2014-03-29 DIAGNOSIS — Z888 Allergy status to other drugs, medicaments and biological substances status: Secondary | ICD-10-CM

## 2014-03-29 DIAGNOSIS — Z7982 Long term (current) use of aspirin: Secondary | ICD-10-CM

## 2014-03-29 DIAGNOSIS — I951 Orthostatic hypotension: Secondary | ICD-10-CM | POA: Diagnosis present

## 2014-03-29 DIAGNOSIS — F319 Bipolar disorder, unspecified: Secondary | ICD-10-CM | POA: Diagnosis present

## 2014-03-29 DIAGNOSIS — Z823 Family history of stroke: Secondary | ICD-10-CM

## 2014-03-29 HISTORY — DX: Noninfective gastroenteritis and colitis, unspecified: K52.9

## 2014-03-29 HISTORY — DX: Dizziness and giddiness: R42

## 2014-03-29 NOTE — ED Notes (Addendum)
Pt arrived via EMS with report of injury to nose s/p to falling from dizziness while ambulating short distances. Face hit night stand with nose swelling/bruising and dried blood. Denies LOC, visual disturbances and neck pain. Noted hematoma to lt lateral forearm. Noted epitaxis prior to EMS arrival that stopped. No lacerations noted. Pt coming from [facility].

## 2014-03-29 NOTE — ED Notes (Signed)
Bed: [id number] Expected date: 03/29/14 Expected time: 11:39 PM Means of arrival: Ambulance Comments: Fall, face injury

## 2014-03-30 ENCOUNTER — Ambulatory Visit (HOSPITAL_COMMUNITY): Payer: Medicare Other

## 2014-03-30 ENCOUNTER — Encounter (HOSPITAL_COMMUNITY): Payer: Self-pay | Admitting: Internal Medicine

## 2014-03-30 ENCOUNTER — Emergency Department (HOSPITAL_COMMUNITY): Payer: Medicare Other

## 2014-03-30 DIAGNOSIS — Z79891 Long term (current) use of opiate analgesic: Secondary | ICD-10-CM | POA: Diagnosis not present

## 2014-03-30 DIAGNOSIS — I951 Orthostatic hypotension: Secondary | ICD-10-CM | POA: Diagnosis present

## 2014-03-30 DIAGNOSIS — Z91040 Latex allergy status: Secondary | ICD-10-CM | POA: Diagnosis not present

## 2014-03-30 DIAGNOSIS — Z888 Allergy status to other drugs, medicaments and biological substances status: Secondary | ICD-10-CM | POA: Diagnosis not present

## 2014-03-30 DIAGNOSIS — Z881 Allergy status to other antibiotic agents status: Secondary | ICD-10-CM | POA: Diagnosis not present

## 2014-03-30 DIAGNOSIS — N184 Chronic kidney disease, stage 4 (severe): Secondary | ICD-10-CM | POA: Diagnosis present

## 2014-03-30 DIAGNOSIS — Z8249 Family history of ischemic heart disease and other diseases of the circulatory system: Secondary | ICD-10-CM | POA: Diagnosis not present

## 2014-03-30 DIAGNOSIS — M810 Age-related osteoporosis without current pathological fracture: Secondary | ICD-10-CM | POA: Diagnosis present

## 2014-03-30 DIAGNOSIS — E539 Vitamin B deficiency, unspecified: Secondary | ICD-10-CM | POA: Diagnosis present

## 2014-03-30 DIAGNOSIS — J449 Chronic obstructive pulmonary disease, unspecified: Secondary | ICD-10-CM | POA: Diagnosis present

## 2014-03-30 DIAGNOSIS — Z800 Family history of malignant neoplasm of digestive organs: Secondary | ICD-10-CM | POA: Diagnosis not present

## 2014-03-30 DIAGNOSIS — Y92129 Unspecified place in nursing home as the place of occurrence of the external cause: Secondary | ICD-10-CM | POA: Diagnosis not present

## 2014-03-30 DIAGNOSIS — S022XXA Fracture of nasal bones, initial encounter for closed fracture: Secondary | ICD-10-CM

## 2014-03-30 DIAGNOSIS — Z823 Family history of stroke: Secondary | ICD-10-CM | POA: Diagnosis not present

## 2014-03-30 DIAGNOSIS — I10 Essential (primary) hypertension: Secondary | ICD-10-CM

## 2014-03-30 DIAGNOSIS — E43 Unspecified severe protein-calorie malnutrition: Secondary | ICD-10-CM | POA: Diagnosis present

## 2014-03-30 DIAGNOSIS — F419 Anxiety disorder, unspecified: Secondary | ICD-10-CM | POA: Diagnosis present

## 2014-03-30 DIAGNOSIS — H547 Unspecified visual loss: Secondary | ICD-10-CM | POA: Diagnosis present

## 2014-03-30 DIAGNOSIS — R42 Dizziness and giddiness: Secondary | ICD-10-CM | POA: Diagnosis present

## 2014-03-30 DIAGNOSIS — I161 Hypertensive emergency: Secondary | ICD-10-CM | POA: Diagnosis present

## 2014-03-30 DIAGNOSIS — K219 Gastro-esophageal reflux disease without esophagitis: Secondary | ICD-10-CM | POA: Diagnosis present

## 2014-03-30 DIAGNOSIS — Z833 Family history of diabetes mellitus: Secondary | ICD-10-CM | POA: Diagnosis not present

## 2014-03-30 DIAGNOSIS — F319 Bipolar disorder, unspecified: Secondary | ICD-10-CM | POA: Diagnosis present

## 2014-03-30 DIAGNOSIS — Z822 Family history of deafness and hearing loss: Secondary | ICD-10-CM | POA: Diagnosis not present

## 2014-03-30 DIAGNOSIS — S5012XA Contusion of left forearm, initial encounter: Secondary | ICD-10-CM | POA: Diagnosis present

## 2014-03-30 DIAGNOSIS — I129 Hypertensive chronic kidney disease with stage 1 through stage 4 chronic kidney disease, or unspecified chronic kidney disease: Secondary | ICD-10-CM | POA: Diagnosis present

## 2014-03-30 DIAGNOSIS — Z7982 Long term (current) use of aspirin: Secondary | ICD-10-CM | POA: Diagnosis not present

## 2014-03-30 DIAGNOSIS — Z87891 Personal history of nicotine dependence: Secondary | ICD-10-CM | POA: Diagnosis not present

## 2014-03-30 DIAGNOSIS — H3530 Unspecified macular degeneration: Secondary | ICD-10-CM | POA: Diagnosis present

## 2014-03-30 DIAGNOSIS — Z681 Body mass index (BMI) 19 or less, adult: Secondary | ICD-10-CM | POA: Diagnosis not present

## 2014-03-30 DIAGNOSIS — W1839XA Other fall on same level, initial encounter: Secondary | ICD-10-CM | POA: Diagnosis present

## 2014-03-30 DIAGNOSIS — Z79899 Other long term (current) drug therapy: Secondary | ICD-10-CM | POA: Diagnosis not present

## 2014-03-30 DIAGNOSIS — N390 Urinary tract infection, site not specified: Secondary | ICD-10-CM | POA: Diagnosis present

## 2014-03-30 DIAGNOSIS — M419 Scoliosis, unspecified: Secondary | ICD-10-CM | POA: Diagnosis present

## 2014-03-30 HISTORY — DX: Fracture of nasal bones, initial encounter for closed fracture: S02.2XXA

## 2014-03-30 LAB — CBC WITH DIFFERENTIAL/PLATELET
Basophils Absolute: 0 10*3/uL (ref 0.0–0.1)
Basophils Absolute: 0 10*3/uL (ref 0.0–0.1)
Basophils Relative: 0 % (ref 0–1)
Basophils Relative: 0 % (ref 0–1)
Eosinophils Absolute: 0.1 10*3/uL (ref 0.0–0.7)
Eosinophils Absolute: 0.2 10*3/uL (ref 0.0–0.7)
Eosinophils Relative: 2 % (ref 0–5)
Eosinophils Relative: 5 % (ref 0–5)
HCT: 38 % (ref 36.0–46.0)
HEMATOCRIT: 41.4 % (ref 36.0–46.0)
Hemoglobin: 12 g/dL (ref 12.0–15.0)
Hemoglobin: 13.3 g/dL (ref 12.0–15.0)
LYMPHS ABS: 1.4 10*3/uL (ref 0.7–4.0)
Lymphocytes Relative: 18 % (ref 12–46)
Lymphocytes Relative: 37 % (ref 12–46)
Lymphs Abs: 1.8 10*3/uL (ref 0.7–4.0)
MCH: 28.8 pg (ref 26.0–34.0)
MCH: 29 pg (ref 26.0–34.0)
MCHC: 31.6 g/dL (ref 30.0–36.0)
MCHC: 32.1 g/dL (ref 30.0–36.0)
MCV: 89.6 fL (ref 78.0–100.0)
MCV: 91.8 fL (ref 78.0–100.0)
Monocytes Absolute: 0.6 10*3/uL (ref 0.1–1.0)
Monocytes Absolute: 0.6 10*3/uL (ref 0.1–1.0)
Monocytes Relative: 13 % — ABNORMAL HIGH (ref 3–12)
Monocytes Relative: 9 % (ref 3–12)
Neutro Abs: 2.2 10*3/uL (ref 1.7–7.7)
Neutro Abs: 5.3 10*3/uL (ref 1.7–7.7)
Neutrophils Relative %: 45 % (ref 43–77)
Neutrophils Relative %: 71 % (ref 43–77)
PLATELETS: 186 10*3/uL (ref 150–400)
Platelets: 183 10*3/uL (ref 150–400)
RBC: 4.14 MIL/uL (ref 3.87–5.11)
RBC: 4.62 MIL/uL (ref 3.87–5.11)
RDW: 14.5 % (ref 11.5–15.5)
RDW: 14.6 % (ref 11.5–15.5)
WBC: 4.8 10*3/uL (ref 4.0–10.5)
WBC: 7.4 10*3/uL (ref 4.0–10.5)

## 2014-03-30 LAB — COMPREHENSIVE METABOLIC PANEL
ALBUMIN: 4.1 g/dL (ref 3.5–5.2)
ALK PHOS: 88 U/L (ref 39–117)
ALT: 14 U/L (ref 0–35)
AST: 25 U/L (ref 0–37)
Anion gap: 9 (ref 5–15)
BILIRUBIN TOTAL: 0.3 mg/dL (ref 0.3–1.2)
BUN: 15 mg/dL (ref 6–23)
CO2: 27 mmol/L (ref 19–32)
Calcium: 9.3 mg/dL (ref 8.4–10.5)
Chloride: 108 mmol/L (ref 96–112)
Creatinine, Ser: 0.63 mg/dL (ref 0.50–1.10)
GFR calc non Af Amer: 81 mL/min — ABNORMAL LOW (ref >90)
Glucose, Bld: 109 mg/dL — ABNORMAL HIGH (ref 70–99)
POTASSIUM: 3.6 mmol/L (ref 3.5–5.1)
SODIUM: 144 mmol/L (ref 135–145)
Total Protein: 7.1 g/dL (ref 6.0–8.3)

## 2014-03-30 LAB — RAPID URINE DRUG SCREEN, HOSP PERFORMED
Amphetamines: NOT DETECTED
Barbiturates: NOT DETECTED
Benzodiazepines: POSITIVE — AB
Cocaine: NOT DETECTED
Opiates: POSITIVE — AB
Tetrahydrocannabinol: NOT DETECTED

## 2014-03-30 LAB — URINALYSIS, ROUTINE W REFLEX MICROSCOPIC
Bilirubin Urine: NEGATIVE
Glucose, UA: NEGATIVE mg/dL
Hgb urine dipstick: NEGATIVE
Ketones, ur: NEGATIVE mg/dL
Nitrite: POSITIVE — AB
Protein, ur: NEGATIVE mg/dL
Specific Gravity, Urine: 1.01 (ref 1.005–1.030)
Urobilinogen, UA: 0.2 mg/dL (ref 0.0–1.0)
pH: 7 (ref 5.0–8.0)

## 2014-03-30 LAB — I-STAT CHEM 8, ED
BUN: 20 mg/dL (ref 6–23)
CALCIUM ION: 1.17 mmol/L (ref 1.13–1.30)
Chloride: 105 mmol/L (ref 96–112)
Creatinine, Ser: 0.8 mg/dL (ref 0.50–1.10)
Glucose, Bld: 97 mg/dL (ref 70–99)
HEMATOCRIT: 39 % (ref 36.0–46.0)
Hemoglobin: 13.3 g/dL (ref 12.0–15.0)
Potassium: 3.6 mmol/L (ref 3.5–5.1)
Sodium: 143 mmol/L (ref 135–145)
TCO2: 24 mmol/L (ref 0–100)

## 2014-03-30 LAB — TROPONIN I: Troponin I: 0.03 ng/mL (ref <0.031)

## 2014-03-30 LAB — VALPROIC ACID LEVEL: Valproic Acid Lvl: 13.9 ug/mL — ABNORMAL LOW (ref 50.0–100.0)

## 2014-03-30 LAB — I-STAT TROPONIN, ED: TROPONIN I, POC: 0 ng/mL (ref 0.00–0.08)

## 2014-03-30 LAB — PROTIME-INR
INR: 1.01 (ref 0.00–1.49)
PROTHROMBIN TIME: 13.4 s (ref 11.6–15.2)

## 2014-03-30 LAB — MRSA PCR SCREENING: MRSA by PCR: NEGATIVE

## 2014-03-30 LAB — ETHANOL: Alcohol, Ethyl (B): <5 mg/dL (ref 0–9)

## 2014-03-30 LAB — URINE MICROSCOPIC-ADD ON

## 2014-03-30 MED ORDER — DIVALPROEX SODIUM 250 MG PO DR TAB
250.0000 mg | DELAYED_RELEASE_TABLET | Freq: Every day | ORAL | Status: DC
Start: 2014-03-30 — End: 2014-04-03
  Administered 2014-03-30 – 2014-04-03 (×5): 250 mg via ORAL
  Filled 2014-03-30 (×5): qty 1

## 2014-03-30 MED ORDER — QUETIAPINE FUMARATE ER 50 MG PO TB24
150.0000 mg | ORAL_TABLET | Freq: Every day | ORAL | Status: DC
  Administered 2014-03-30 – 2014-04-02 (×5): 150 mg via ORAL
  Filled 2014-03-30 (×5): qty 3

## 2014-03-30 MED ORDER — LEVOCETIRIZINE DIHYDROCHLORIDE 5 MG PO TABS: 5.0000 mg | ORAL_TABLET | Freq: Every day | ORAL | Status: DC | PRN

## 2014-03-30 MED ORDER — HYDROCODONE-ACETAMINOPHEN 10-325 MG PO TABS
1.0000 | ORAL_TABLET | Freq: Four times a day (QID) | ORAL | Status: DC | PRN
  Administered 2014-03-30 – 2014-04-03 (×12): 1 via ORAL
  Filled 2014-03-30 (×12): qty 1

## 2014-03-30 MED ORDER — DICLOFENAC EPOLAMINE 1.3 % TD PTCH
1.0000 | MEDICATED_PATCH | Freq: Two times a day (BID) | TRANSDERMAL | Status: DC | PRN
  Administered 2014-03-30 – 2014-04-03 (×3): 1 via TRANSDERMAL
  Filled 2014-03-30 (×6): qty 1

## 2014-03-30 MED ORDER — LATANOPROST 0.005 % OP SOLN
1.0000 [drp] | Freq: Every day | OPHTHALMIC | Status: DC
  Administered 2014-04-01 – 2014-04-02 (×2): 1 [drp] via OPHTHALMIC
  Filled 2014-03-30: qty 2.5

## 2014-03-30 MED ORDER — ALBUTEROL SULFATE (2.5 MG/3ML) 0.083% IN NEBU: 3.0000 mL | INHALATION_SOLUTION | RESPIRATORY_TRACT | Status: DC | PRN

## 2014-03-30 MED ORDER — HYDRALAZINE HCL 20 MG/ML IJ SOLN: 2.0000 mg | INTRAMUSCULAR | Status: DC | PRN

## 2014-03-30 MED ORDER — HYDROCORTISONE 2.5 % RE CREA
1.0000 "application " | TOPICAL_CREAM | Freq: Two times a day (BID) | RECTAL | Status: DC
  Administered 2014-04-02: 1 via RECTAL
  Filled 2014-03-30: qty 28.35

## 2014-03-30 MED ORDER — NITROGLYCERIN 0.4 MG SL SUBL: 0.4000 mg | SUBLINGUAL_TABLET | SUBLINGUAL | Status: DC | PRN

## 2014-03-30 MED ORDER — VITAMIN D 1000 UNITS PO TABS
1000.0000 [IU] | ORAL_TABLET | Freq: Every day | ORAL | Status: DC
  Administered 2014-03-30 – 2014-04-03 (×5): 1000 [IU] via ORAL
  Filled 2014-03-30 (×5): qty 1

## 2014-03-30 MED ORDER — SODIUM CHLORIDE 0.9 % IV BOLUS (SEPSIS)
1000.0000 mL | Freq: Once | INTRAVENOUS | Status: AC
  Administered 2014-03-30: 1000 mL via INTRAVENOUS

## 2014-03-30 MED ORDER — HYDRALAZINE HCL 20 MG/ML IJ SOLN
2.0000 mg | Freq: Once | INTRAMUSCULAR | Status: AC
  Administered 2014-03-30: 2 mg via INTRAVENOUS
  Filled 2014-03-30: qty 1

## 2014-03-30 MED ORDER — POLYETHYLENE GLYCOL 3350 17 G PO PACK
17.0000 g | PACK | Freq: Every day | ORAL | Status: DC
  Administered 2014-03-30 – 2014-04-03 (×5): 17 g via ORAL
  Filled 2014-03-30 (×5): qty 1

## 2014-03-30 MED ORDER — DORZOLAMIDE HCL-TIMOLOL MAL 2-0.5 % OP SOLN
1.0000 [drp] | Freq: Two times a day (BID) | OPHTHALMIC | Status: DC
  Administered 2014-03-30 – 2014-04-03 (×9): 1 [drp] via OPHTHALMIC
  Filled 2014-03-30: qty 10

## 2014-03-30 MED ORDER — DOCUSATE SODIUM 100 MG PO CAPS
100.0000 mg | ORAL_CAPSULE | Freq: Two times a day (BID) | ORAL | Status: DC
  Administered 2014-03-30 – 2014-04-03 (×9): 100 mg via ORAL
  Filled 2014-03-30 (×9): qty 1

## 2014-03-30 MED ORDER — CEFTRIAXONE SODIUM IN DEXTROSE 20 MG/ML IV SOLN
1.0000 g | Freq: Every day | INTRAVENOUS | Status: DC
  Administered 2014-03-30 – 2014-04-02 (×4): 1 g via INTRAVENOUS
  Filled 2014-03-30 (×4): qty 50

## 2014-03-30 MED ORDER — ACETAMINOPHEN 325 MG PO TABS
650.0000 mg | ORAL_TABLET | Freq: Four times a day (QID) | ORAL | Status: DC | PRN
  Administered 2014-03-31: 650 mg via ORAL
  Filled 2014-03-30: qty 2

## 2014-03-30 MED ORDER — SODIUM CHLORIDE 0.9 % IV SOLN
INTRAVENOUS | Status: DC
  Administered 2014-03-30: 05:00:00 via INTRAVENOUS

## 2014-03-30 MED ORDER — ONDANSETRON HCL 4 MG PO TABS: 4.0000 mg | ORAL_TABLET | Freq: Four times a day (QID) | ORAL | Status: DC | PRN

## 2014-03-30 MED ORDER — TRAVOPROST 0.004 % OP SOLN: 1.0000 [drp] | Freq: Every day | OPHTHALMIC | Status: DC

## 2014-03-30 MED ORDER — LORATADINE 10 MG PO TABS
10.0000 mg | ORAL_TABLET | Freq: Every day | ORAL | Status: DC | PRN
  Filled 2014-03-30: qty 1

## 2014-03-30 MED ORDER — CLORAZEPATE DIPOTASSIUM 7.5 MG PO TABS
7.5000 mg | ORAL_TABLET | Freq: Two times a day (BID) | ORAL | Status: DC
  Administered 2014-03-30 – 2014-04-01 (×5): 7.5 mg via ORAL
  Filled 2014-03-30 (×5): qty 1

## 2014-03-30 MED ORDER — SODIUM CHLORIDE 0.9 % IJ SOLN
3.0000 mL | Freq: Two times a day (BID) | INTRAMUSCULAR | Status: DC
  Administered 2014-03-31 – 2014-04-03 (×6): 3 mL via INTRAVENOUS

## 2014-03-30 MED ORDER — TRIAZOLAM 0.125 MG PO TABS
0.5000 mg | ORAL_TABLET | Freq: Every day | ORAL | Status: DC
  Administered 2014-03-30 – 2014-03-31 (×2): 0.5 mg via ORAL
  Filled 2014-03-30 (×2): qty 4

## 2014-03-30 MED ORDER — MECLIZINE HCL 25 MG PO TABS
25.0000 mg | ORAL_TABLET | Freq: Three times a day (TID) | ORAL | Status: DC | PRN
  Filled 2014-03-30: qty 1

## 2014-03-30 MED ORDER — FLUDROCORTISONE ACETATE 0.1 MG PO TABS
0.1000 mg | ORAL_TABLET | Freq: Every morning | ORAL | Status: DC
  Administered 2014-03-30 – 2014-04-03 (×5): 0.1 mg via ORAL
  Filled 2014-03-30 (×5): qty 1

## 2014-03-30 MED ORDER — MIRTAZAPINE 45 MG PO TABS
45.0000 mg | ORAL_TABLET | Freq: Every day | ORAL | Status: DC
  Administered 2014-03-30: 45 mg via ORAL
  Filled 2014-03-30: qty 1

## 2014-03-30 MED ORDER — ACETAMINOPHEN 650 MG RE SUPP: 650.0000 mg | Freq: Four times a day (QID) | RECTAL | Status: DC | PRN

## 2014-03-30 MED ORDER — ONDANSETRON HCL 4 MG/2ML IJ SOLN: 4.0000 mg | Freq: Four times a day (QID) | INTRAMUSCULAR | Status: DC | PRN

## 2014-03-30 MED ORDER — FAMOTIDINE 20 MG PO TABS
20.0000 mg | ORAL_TABLET | Freq: Two times a day (BID) | ORAL | Status: DC
  Administered 2014-03-30 – 2014-04-03 (×9): 20 mg via ORAL
  Filled 2014-03-30 (×9): qty 1

## 2014-03-30 MED ORDER — DEXTROSE 5 % IV SOLN
1.0000 g | Freq: Once | INTRAVENOUS | Status: AC
  Administered 2014-03-30: 1 g via INTRAVENOUS
  Filled 2014-03-30: qty 10

## 2014-03-30 NOTE — Progress Notes (Signed)
ANTIBIOTIC CONSULT NOTE - INITIAL  Pharmacy Consult for Rocephin Indication: UTI  Allergies  Allergen Reactions  . Macrobid [Nitrofurantoin Macrocrystal] Rash  . Azithromycin Other (See Comments)    unknown  . Doxycycline Other (See Comments)    unknown  . Escitalopram Oxalate Other (See Comments)    unknown  . Fioricet [Butalbital-Apap-Caffeine] Other (See Comments)    Drunk.  . Latex Other (See Comments)    unknown  . Levofloxacin Other (See Comments)    unknown  . Nsaids Other (See Comments)    Allergic reaction not listed on MAR.  Marland Kitchen Oxycodone Other (See Comments)    reaction to synthetic codeine  . Restasis [Cyclosporine] Other (See Comments)    unknown  . Sulfa Antibiotics Other (See Comments)    Reaction unknown  . Tylenol [Acetaminophen] Other (See Comments)    jaundice  . Biaxin [Clarithromycin] Rash    With burning sensation    Patient Measurements: Height: 5\' 2"  (157.5 cm) Weight: 105 lb 13.1 oz (48 kg) IBW/kg (Calculated) : 50.1   Vital Signs: Temp: 97.2 F (36.2 C) (02/16 0428) Temp Source: Oral (02/16 0428) BP: 148/102 mmHg (02/16 0428) Pulse Rate: 74 (02/16 0428) Intake/Output from previous day:   Intake/Output from this shift:    Labs:  Recent Labs  03/30/14 0019 03/30/14 0029  WBC 4.8  --   HGB 12.0 13.3  PLT 183  --   CREATININE  --  0.80   Estimated Creatinine Clearance: 40.4 mL/min (by C-G formula based on Cr of 0.8). No results for input(s): VANCOTROUGH, VANCOPEAK, VANCORANDOM, GENTTROUGH, GENTPEAK, GENTRANDOM, TOBRATROUGH, TOBRAPEAK, TOBRARND, AMIKACINPEAK, AMIKACINTROU, AMIKACIN in the last 72 hours.   Microbiology: Recent Results (from the past 720 hour(s))  Urine culture     Status: None   Collection Time: 03/06/14 11:40 PM  Result Value Ref Range Status   Specimen Description URINE, CLEAN CATCH  Final   Special Requests NONE  Final   Colony Count NO GROWTH Performed at [facility]   Final   Culture NO  GROWTH Performed at [facility]   Final   Report Status 03/08/2014 FINAL  Final    Medical History: Past Medical History  Diagnosis Date  . Osteoporosis   . Depression   . Anxiety   . COPD (chronic obstructive pulmonary disease)   . GERD (gastroesophageal reflux disease)   . Abuse     benzos/narcotics  . Migraines   . Scoliosis   . Insomnia, unspecified   . Vitamin B deficiency   . Weight loss   . Chronic mental illness   . Arterial tortuosity (aorta) 12/11/2012  . Chronic kidney disease, stage IV (severe) 12/11/2012  . Substance abuse     Benzos and Narcotics, she does not get any of those medicines from [facility], we have empahtically told her that.   . Vertigo   . Colitis     Medications:  Scheduled:  . cefTRIAXone (ROCEPHIN)  IV  1 g Intravenous QHS  . cholecalciferol  1,000 Units Oral Daily  . clorazepate  7.5 mg Oral BID  . divalproex  250 mg Oral Daily  . docusate sodium  100 mg Oral BID  . dorzolamide-timolol  1 drop Both Eyes BID  . famotidine  20 mg Oral BID  . fludrocortisone  0.1 mg Oral q morning - 10a  . hydrocortisone  1 application Rectal BID  . latanoprost  1 drop Both Eyes QHS  . mirtazapine  45 mg Oral QHS  .  polyethylene glycol  17 g Oral Daily  . QUEtiapine Fumarate  150 mg Oral Q2000  . sodium chloride  3 mL Intravenous Q12H  . triazolam  0.5 mg Oral QHS   Infusions:  . sodium chloride     Assessment: 56 yoF admitted with UTI. Rocephin per Rx.   Goal of Therapy:  Treat UTI.  Plan:   Rocephin 1gm IV q24h  F/u cultures as needed  Lawana Pai R 03/30/2014,5:00 AM

## 2014-03-30 NOTE — H&P (Addendum)
[name] History and Physical  Taylor Roth [id number] DOB: 06-Jan-1929 DOA: 03/29/2014  Referring physician: ER physician. PCP: Hollace Kinnier, DO   Chief Complaint: Fall and dizziness.  HPI: Taylor Roth is a 81 y.o. female with history of chronic orthostatic hypotension on fludrocortisone, COPD, bipolar disorder, protein calorie malnutrition was brought to the ER after patient had a fall in the nursing home. Patient states that she has been feeling dizzy off and on and yesterday while trying to walk shortly she fell onto her face. Did not lose consciousness. He had a nasal injury. In the ER CT head and maxillofacial done shows nondisplaced nasal bone fracture. In the ER patient was found to have very high blood pressure and patient does not have any history of hypertension. Patient was given 1 dose of hydralazine 2 mg IV for which patient blood pressure improved. On my exam patient states he does not have any headache or any focal deficits. Patient will be admitted for further observation. Patient denies any nausea vomiting abdominal pain diarrhea chest pain palpitations shortness of breath. Patient has poor vision from macular degeneration.   Review of Systems: As presented in the history of presenting illness, rest negative.  Past Medical History  Diagnosis Date  . Osteoporosis   . Depression   . Anxiety   . COPD (chronic obstructive pulmonary disease)   . GERD (gastroesophageal reflux disease)   . Abuse     benzos/narcotics  . Migraines   . Scoliosis   . Insomnia, unspecified   . Vitamin B deficiency   . Weight loss   . Chronic mental illness   . Arterial tortuosity (aorta) 12/11/2012  . Chronic kidney disease, stage IV (severe) 12/11/2012  . Substance abuse     Benzos and Narcotics, she does not get any of those medicines from [facility], we have empahtically told her that.   . Vertigo   . Colitis    Past Surgical History  Procedure Laterality Date  .  Cholecystectomy    . Partial hysterectomy    . Abdominal hysterectomy    . Cataract extraction     Social History:  reports that she has quit smoking. Her smoking use included Cigarettes. She smoked 0.50 packs per day. She has never used smokeless tobacco. She reports that she does not drink alcohol or use illicit drugs. Where does patient live nursing home. Can patient participate in ADLs? Not sure.  Allergies  Allergen Reactions  . Macrobid [Nitrofurantoin Macrocrystal] Rash  . Azithromycin Other (See Comments)    unknown  . Doxycycline Other (See Comments)    unknown  . Escitalopram Oxalate Other (See Comments)    unknown  . Fioricet [Butalbital-Apap-Caffeine] Other (See Comments)    Drunk.  . Latex Other (See Comments)    unknown  . Levofloxacin Other (See Comments)    unknown  . Nsaids Other (See Comments)    Allergic reaction not listed on MAR.  Marland Kitchen Oxycodone Other (See Comments)    reaction to synthetic codeine  . Restasis [Cyclosporine] Other (See Comments)    unknown  . Sulfa Antibiotics Other (See Comments)    Reaction unknown  . Tylenol [Acetaminophen] Other (See Comments)    jaundice  . Biaxin [Clarithromycin] Rash    With burning sensation    Family History:  Family History  Problem Relation Age of Onset  . Stroke Mother   . Diabetes Mother   . Cancer Father 62    esophageal  . Heart disease  Mother   . Hearing loss Mother       Prior to Admission medications   Medication Sig Start Date End Date Taking? Authorizing Provider  acetaminophen (TYLENOL) 325 MG tablet Take 650 mg by mouth every 6 (six) hours as needed for headache.   Yes Historical Provider, MD  aspirin EC 81 MG tablet Take 81 mg by mouth daily.   Yes Historical Provider, MD  Cholecalciferol (VITAMIN D) 1000 UNITS capsule Take 1,000 Units by mouth daily.   Yes Historical Provider, MD  clorazepate (TRANXENE) 7.5 MG tablet Take 7.5 mg by mouth 2 (two) times daily.  12/23/12  Yes Estill Dooms,  MD  diclofenac (FLECTOR) 1.3 % PTCH Place 1 patch onto the skin every 12 (twelve) hours as needed (For localized pain.).    Yes Historical Provider, MD  divalproex (DEPAKOTE) 250 MG DR tablet Take 250 mg by mouth daily.   Yes Historical Provider, MD  docusate sodium (COLACE) 100 MG capsule Take 100 mg by mouth 2 (two) times daily.   Yes Historical Provider, MD  dorzolamide-timolol (COSOPT) 22.3-6.8 MG/ML ophthalmic solution Place 1 drop into both eyes 2 (two) times daily.   Yes Historical Provider, MD  fludrocortisone (FLORINEF) 0.1 MG tablet Take 0.1 mg by mouth every morning.   Yes Historical Provider, MD  Ginkgo Biloba 120 MG CAPS Take 120 mg by mouth 2 (two) times daily.    Yes Historical Provider, MD  guaiFENesin (ROBITUSSIN) 100 MG/5ML liquid Take 200 mg by mouth every 6 (six) hours as needed for cough.   Yes Historical Provider, MD  HYDROcodone-acetaminophen (NORCO) 10-325 MG per tablet Take 1 tablet by mouth every 6 (six) hours as needed (pain).    Yes Historical Provider, MD  hydrocortisone (ANUSOL-HC) 2.5 % rectal cream Place 1 application rectally 2 (two) times daily.   Yes Historical Provider, MD  levalbuterol [facility] HFA) 45 MCG/ACT inhaler Inhale 2 puffs into the lungs every 4 (four) hours as needed for wheezing or shortness of breath.  08/26/12  Yes Darlyne Russian, MD  levocetirizine (XYZAL) 5 MG tablet Take 5 mg by mouth daily as needed for allergies.   Yes Historical Provider, MD  meclizine (ANTIVERT) 25 MG tablet Take 25 mg by mouth 3 (three) times daily as needed for dizziness. Do not combine with Hydroxyzine or any other antihistamine. 11/12/13  Yes Thao P Le, DO  Melatonin 1 MG TABS Take 1 mg by mouth at bedtime.    Yes Historical Provider, MD  menthol-cetylpyridinium (CEPACOL) 3 MG lozenge Take 1 lozenge by mouth every 2 (two) hours as needed for sore throat.   Yes Historical Provider, MD  mirtazapine (REMERON) 45 MG tablet Take 45 mg by mouth at bedtime.   Yes Historical Provider,  MD  nitroGLYCERIN (NITROSTAT) 0.4 MG SL tablet Place 0.4 mg under the tongue every 5 (five) minutes as needed for chest pain. May use 3 times   Yes Historical Provider, MD  ondansetron (ZOFRAN) 4 MG tablet Take 4 mg by mouth every 4 (four) hours as needed for nausea. 12/12/12  Yes [name], MD  Polyethyl Glycol-Propyl Glycol (SYSTANE OP) Apply 1 drop to eye every 12 (twelve) hours as needed (for dry eyes).    Yes Historical Provider, MD  polyethylene glycol (MIRALAX / GLYCOLAX) packet Take 17 g by mouth daily.   Yes Historical Provider, MD  PRESCRIPTION MEDICATION Take 1 Container by mouth 2 (two) times daily between meals.   Yes Historical Provider, MD  QUEtiapine  Fumarate (SEROQUEL XR) 150 MG 24 hr tablet Take 150 mg by mouth daily at 8 pm.   Yes Historical Provider, MD  ranitidine (ZANTAC) 150 MG tablet Take 150 mg by mouth 2 (two) times daily.   Yes Historical Provider, MD  Sennosides (SENNA LAX PO) Take 2 tablets by mouth 2 (two) times daily. Take 2 tablets in the morning and take 2 tablets at bedtime.   Yes Historical Provider, MD  sodium chloride (OCEAN) 0.65 % SOLN nasal spray Place 2 sprays into both nostrils every 4 (four) hours as needed (For nasal congestion.).   Yes Historical Provider, MD  travoprost, benzalkonium, (TRAVATAN) 0.004 % ophthalmic solution Place 1 drop into both eyes at bedtime.   Yes Historical Provider, MD  triazolam (HALCION) 0.25 MG tablet Take 0.5 mg by mouth at bedtime.    Yes Historical Provider, MD  aspirin 81 MG chewable tablet Chew 1 tablet (81 mg total) by mouth daily. Patient not taking: Reported on 03/30/2014 12/12/12   [name], MD  Pseudoephedrine-Acetaminophen (CEPACOL SORE THROAT PO) Take 1 tablet by mouth every 2 (two) hours as needed (sore throat).    Historical Provider, MD    Physical Exam: Filed Vitals:   03/30/14 0230 03/30/14 0231 03/30/14 0300 03/30/14 0330  BP: 187/96 187/96 135/72 159/86  Pulse: 69  80 75  Temp:       TempSrc:      Resp: 11  18 18   SpO2: 99%  100% 100%     General:  Poorly built and nourished.  Eyes: Anicteric no pallor.  ENT: Nasal area is swollen from the fracture.  Neck: No neck rigidity.  Cardiovascular: S1 and S2 heard.  Respiratory: No rhonchi or crepitations.  Abdomen: Soft nontender bowel sounds present.  Skin: Skin around the nose is erythematous from the hematoma.  Musculoskeletal: No edema.  Psychiatric: No suicidal thoughts.  Neurologic: Alert awake oriented to time place and person and moves all extremities 5 x 5.  Labs on Admission:  Basic Metabolic Panel:  Recent Labs Lab 03/30/14 0029  NA 143  K 3.6  CL 105  GLUCOSE 97  BUN 20  CREATININE 0.80   Liver Function Tests: No results for input(s): AST, ALT, ALKPHOS, BILITOT, PROT, ALBUMIN in the last 168 hours. No results for input(s): LIPASE, AMYLASE in the last 168 hours. No results for input(s): AMMONIA in the last 168 hours. CBC:  Recent Labs Lab 03/30/14 0019 03/30/14 0029  WBC 4.8  --   NEUTROABS 2.2  --   HGB 12.0 13.3  HCT 38.0 39.0  MCV 91.8  --   PLT 183  --    Cardiac Enzymes: No results for input(s): CKTOTAL, CKMB, CKMBINDEX, TROPONINI in the last 168 hours.  BNP (last 3 results) No results for input(s): BNP in the last 8760 hours.  ProBNP (last 3 results) No results for input(s): PROBNP in the last 8760 hours.  CBG: No results for input(s): GLUCAP in the last 168 hours.  Radiological Exams on Admission: Dg Chest 2 View  03/30/2014   CLINICAL DATA:  Weakness and confusion.  Status post fall today.  EXAM: CHEST  2 VIEW  COMPARISON:  PA and lateral chest 11/12/2013.  FINDINGS: Lungs are clear. Heart size is normal. No pneumothorax or pleural effusion. No acute bony abnormality is identified with scoliosis is again seen.  IMPRESSION: No acute disease.   Electronically Signed   By: Inge Rise M.D.   On: 03/30/2014 00:46   Ct  Head Wo Contrast  03/30/2014   CLINICAL  DATA:  Lightheadedness.  Fell with injury to nose.  EXAM: CT HEAD WITHOUT CONTRAST  CT MAXILLOFACIAL WITHOUT CONTRAST  TECHNIQUE: Multidetector CT imaging of the head and maxillofacial structures were performed using the standard protocol without intravenous contrast. Multiplanar CT image reconstructions of the maxillofacial structures were also generated.  COMPARISON:  None.  03/06/2014  FINDINGS: CT HEAD FINDINGS  There is no intracranial hemorrhage or extra-axial fluid collection. There is moderate generalized atrophy and chronic microvascular disease, unchanged from 03/06/2014. The calvarium and skullbase are intact.  CT MAXILLOFACIAL FINDINGS  There are nondisplaced nasal fractures. No other fractures are evident. Orbital floors are intact. Zygomatic arches and pterygoid plates are intact. Mandible is intact.  IMPRESSION: No evidence of acute intracranial traumatic injury. Atrophy and chronic microvascular ischemic disease again evident. Nondisplaced nasal bone fractures.   Electronically Signed   By: Andreas Newport M.D.   On: 03/30/2014 00:44   Ct Maxillofacial Wo Cm  03/30/2014   CLINICAL DATA:  Lightheadedness.  Fell with injury to nose.  EXAM: CT HEAD WITHOUT CONTRAST  CT MAXILLOFACIAL WITHOUT CONTRAST  TECHNIQUE: Multidetector CT imaging of the head and maxillofacial structures were performed using the standard protocol without intravenous contrast. Multiplanar CT image reconstructions of the maxillofacial structures were also generated.  COMPARISON:  None.  03/06/2014  FINDINGS: CT HEAD FINDINGS  There is no intracranial hemorrhage or extra-axial fluid collection. There is moderate generalized atrophy and chronic microvascular disease, unchanged from 03/06/2014. The calvarium and skullbase are intact.  CT MAXILLOFACIAL FINDINGS  There are nondisplaced nasal fractures. No other fractures are evident. Orbital floors are intact. Zygomatic arches and pterygoid plates are intact. Mandible is intact.   IMPRESSION: No evidence of acute intracranial traumatic injury. Atrophy and chronic microvascular ischemic disease again evident. Nondisplaced nasal bone fractures.   Electronically Signed   By: Andreas Newport M.D.   On: 03/30/2014 00:44    EKG: Independently reviewed. Normal sinus rhythm with QTC of 424 ms.  Assessment/Plan Active Problems:   Hypertensive emergency   Dizziness   Nasal bone fracture   UTI (lower urinary tract infection)   1. Dizziness with history of chronic postnasal hypotension - patient did receive 1 L normal saline bolus in the ER and we will continue with fludrocortisone. Monitor in telemetry. Check orthostatics. Physical therapy consult. Patient is on multiple antidepressant medications and at this time QTC is within acceptable limit. Rule out any arrhythmias. 2. Hypertensive urgency - patient's blood pressure was found to be significantly elevated probably secondary to stress. Patient does not have a history of hypertension. Patient was given 1 dose of hydralazine 2 mg IV in the ER. I have Patient on when necessary IV hydralazine 2 mg if blood pressure is more than 160. Closely follow blood pressure trends. 3. Nasal bone fracture nondisplaced - may discuss with ENT in a.m. 4. Bipolar disorder - continue present medications. 5. COPD - presently not wheezing. 6. UTI - patient is on ceftriaxone for her urine cultures. 7. Protein calorie malnutrition - dietary consult.  CT C-spine is pending.   DVT Prophylaxis SCDs.  Code Status: Full code.  Family Communication: None.  Disposition Plan: Admit for observation.    Melford Tullier N. [name] Pager [phone].  If 7PM-7AM, please contact night-coverage [email or web address] Password [facility] 03/30/2014, 3:44 AM

## 2014-03-30 NOTE — Progress Notes (Signed)
Patient was admitted earlier today. H&P has been reviewed. Patient seen and examined.  Patient complains of dizziness. Complains of a burning sensation with urination  Vital signs reviewed.  Bruising noted on her nose Lungs are clear to auscultation bilaterally S1, S2 is normal, regular. No S3, S4. No rubs, murmurs, or bruit Abdomen is mildly distended without any tenderness. No masses or organomegaly. Bowel sounds are present No pedal edema. Alert and oriented x3. No focal neurological deficits are noted  Patient presented from SNF after a fall due to dizziness. Likely a near syncopal event. She has a long-standing history of postural hypotension. She is on fludrocortisone for same. UTI could've also contributed. She has a nondisplaced nasal bone fracture. Pain control will be provided. Blood pressure control. Continue with antibiotics for UTI. Follow culture reports. PT and OT to see.  Agree with other plan as per Dr. Hal Hope.  Taylor Roth 03/30/2014 2:37 PM

## 2014-03-30 NOTE — ED Notes (Signed)
Pt yelling out d/t pain with voiding. Pt had incontinent episodes of urine. Noted foul odor urine smell.

## 2014-03-30 NOTE — ED Provider Notes (Signed)
CSN: [id number]     Arrival date & time 03/29/14  2350 History   First MD Initiated Contact with Patient 03/30/14 0000     Chief Complaint  Patient presents with  . Facial Injury  . Fall     (Consider location/radiation/quality/duration/timing/severity/associated sxs/prior Treatment) HPI  Taylor Roth is a 81 y.o. female withpast medical history of vertigo presenting today after a fall. Patient states she has been dizzy whenever she looks up for several months now. EMS patient had dizziness and fell and hit her nightstand. She has swelling to her nose along with epistaxis which has resolved. She denies any LOC or neck pain.  She also has a hematoma to her left forearm. She is currently only admitting to pain along her nasal bridge. She denies any fevers or recent infections. She denies any coughing or dysuria. Patient has no further complaints.  10 Systems reviewed and are negative for acute change except as noted in the HPI.    Past Medical History  Diagnosis Date  . Osteoporosis   . Depression   . Anxiety   . COPD (chronic obstructive pulmonary disease)   . GERD (gastroesophageal reflux disease)   . Abuse     benzos/narcotics  . Migraines   . Scoliosis   . Insomnia, unspecified   . Vitamin B deficiency   . Weight loss   . Chronic mental illness   . Arterial tortuosity (aorta) 12/11/2012  . Chronic kidney disease, stage IV (severe) 12/11/2012  . Substance abuse     Benzos and Narcotics, she does not get any of those medicines from [facility], we have empahtically told her that.   . Vertigo   . Colitis    Past Surgical History  Procedure Laterality Date  . Cholecystectomy    . Partial hysterectomy    . Abdominal hysterectomy    . Cataract extraction     Family History  Problem Relation Age of Onset  . Stroke Mother   . Diabetes Mother   . Cancer Father 21    esophageal  . Heart disease Mother   . Hearing loss Mother    History  Substance Use Topics  . Smoking  status: Former Smoker -- 0.50 packs/day    Types: Cigarettes  . Smokeless tobacco: Never Used  . Alcohol Use: No   OB History    No data available     Review of Systems    Allergies  Macrobid; Azithromycin; Doxycycline; Escitalopram oxalate; Fioricet; Latex; Levofloxacin; Nsaids; Oxycodone; Restasis; Sulfa antibiotics; Tylenol; and Biaxin  Home Medications   Prior to Admission medications   Medication Sig Start Date End Date Taking? Authorizing Provider  aspirin 81 MG chewable tablet Chew 1 tablet (81 mg total) by mouth daily. 12/12/12   [name], MD  Cholecalciferol (VITAMIN D) 1000 UNITS capsule Take 1,000 Units by mouth daily.    Historical Provider, MD  clorazepate (TRANXENE) 7.5 MG tablet Take 7.5 mg by mouth 2 (two) times daily.  12/23/12   Estill Dooms, MD  diclofenac (FLECTOR) 1.3 % PTCH Place 1 patch onto the skin every 12 (twelve) hours as needed (pain).     Historical Provider, MD  divalproex (DEPAKOTE) 250 MG DR tablet Take 250 mg by mouth daily.    Historical Provider, MD  docusate sodium (COLACE) 100 MG capsule Take 100 mg by mouth 2 (two) times daily.    Historical Provider, MD  dorzolamide-timolol (COSOPT) 22.3-6.8 MG/ML ophthalmic solution Place 1 drop into both eyes  2 (two) times daily.    Historical Provider, MD  fludrocortisone (FLORINEF) 0.1 MG tablet Take 0.1 mg by mouth every morning.    Historical Provider, MD  Ginkgo Biloba 120 MG CAPS Take 120 mg by mouth 2 (two) times daily.     Historical Provider, MD  guaiFENesin (ROBITUSSIN) 100 MG/5ML liquid Take 10 mLs by mouth every 6 (six) hours as needed for cough.    Historical Provider, MD  HYDROcodone-acetaminophen (NORCO) 10-325 MG per tablet Take 1 tablet by mouth every 6 (six) hours as needed (pain).     Historical Provider, MD  hydrocortisone (ANUSOL-HC) 2.5 % rectal cream Place 1 application rectally 2 (two) times daily.    Historical Provider, MD  hydrOXYzine (ATARAX/VISTARIL) 10 MG tablet Take 10  mg by mouth every 6 (six) hours as needed for anxiety (anxiety).    Historical Provider, MD  levalbuterol [facility] HFA) 45 MCG/ACT inhaler Inhale 2 puffs into the lungs every 4 (four) hours as needed for wheezing or shortness of breath.  08/26/12   Darlyne Russian, MD  levocetirizine (XYZAL) 5 MG tablet Take 5 mg by mouth daily as needed for allergies.    Historical Provider, MD  meclizine (ANTIVERT) 25 MG tablet Take 25 mg by mouth 3 (three) times daily as needed for dizziness (dizziness). Do not combine with Hydroxyzine or any other antihistamine. 11/12/13   Thao P Le, DO  Melatonin 1 MG TABS Take 1 mg by mouth at bedtime.     Historical Provider, MD  mirtazapine (REMERON) 45 MG tablet Take 45 mg by mouth at bedtime.    Historical Provider, MD  nitroGLYCERIN (NITROSTAT) 0.4 MG SL tablet Place 0.4 mg under the tongue every 5 (five) minutes as needed for chest pain. May use 3 times    Historical Provider, MD  Nutritional Supplements (NUTRITIONAL DRINK PO) Take 237 mLs by mouth 2 (two) times daily between meals. [employer].    Historical Provider, MD  ondansetron (ZOFRAN) 4 MG tablet Take 4 mg by mouth every 4 (four) hours as needed for nausea. 12/12/12   [name], MD  Polyethyl Glycol-Propyl Glycol (SYSTANE OP) Apply 1 drop to eye as needed (for dry eyes).    Historical Provider, MD  polyethylene glycol (MIRALAX / GLYCOLAX) packet Take 17 g by mouth daily.    Historical Provider, MD  Pseudoephedrine-Acetaminophen (CEPACOL SORE THROAT PO) Take 1 tablet by mouth every 2 (two) hours as needed (sore throat).    Historical Provider, MD  QUEtiapine Fumarate (SEROQUEL XR) 150 MG 24 hr tablet Take 150 mg by mouth daily at 8 pm.    Historical Provider, MD  ranitidine (ZANTAC) 150 MG tablet Take 150 mg by mouth 2 (two) times daily.    Historical Provider, MD  Sennosides (SENNA LAX PO) Take 2 tablets by mouth 2 (two) times daily. Take 2 tablets in the morning and take 2 tablets at bedtime.    Historical  Provider, MD  travoprost, benzalkonium, (TRAVATAN) 0.004 % ophthalmic solution Place 1 drop into both eyes at bedtime.    Historical Provider, MD  triazolam (HALCION) 0.25 MG tablet Take 0.5 mg by mouth at bedtime.     Historical Provider, MD   There were no vitals taken for this visit. Physical Exam  Constitutional: She is oriented to person, place, and time. She appears well-developed and well-nourished. No distress.  HENT:  Head: Normocephalic.  Nose: Nose normal.  Mouth/Throat: Oropharynx is clear and moist. No oropharyngeal exudate.  Obvious swelling  to the nasal bridge. There is dried blood seen in bilateral nares, no active hemorrhage. There is tenderness to palpation over the nasal bridge.  Eyes: Conjunctivae and EOM are normal. Pupils are equal, round, and reactive to light. No scleral icterus.  Neck: Normal range of motion. Neck supple. No JVD present. No tracheal deviation present. No thyromegaly present.  Cardiovascular: Normal rate, regular rhythm and normal heart sounds.  Exam reveals no gallop and no friction rub.   No murmur heard. Pulmonary/Chest: Effort normal and breath sounds normal. No respiratory distress. She has no wheezes. She exhibits no tenderness.  Abdominal: Soft. Bowel sounds are normal. She exhibits no distension and no mass. There is no tenderness. There is no rebound and no guarding.  Musculoskeletal: Normal range of motion. She exhibits no edema or tenderness.  Soft tissue hematoma seen in the left lateral forearm. There is no bony step-offs, there is no tenderness to palpation of that area.  Lymphadenopathy:    She has no cervical adenopathy.  Neurological: She is alert and oriented to person, place, and time. No cranial nerve deficit. She exhibits normal muscle tone.  Skin: Skin is warm and dry. No rash noted. She is not diaphoretic. No erythema. No pallor.  Nursing note and vitals reviewed.   ED Course  Procedures (including critical care time) Labs  Review Labs Reviewed  CBC WITH DIFFERENTIAL/PLATELET - Abnormal; Notable for the following:    Monocytes Relative 13 (*)    All other components within normal limits  URINALYSIS, ROUTINE W REFLEX MICROSCOPIC - Abnormal; Notable for the following:    APPearance CLOUDY (*)    Nitrite POSITIVE (*)    Leukocytes, UA MODERATE (*)    All other components within normal limits  URINE MICROSCOPIC-ADD ON - Abnormal; Notable for the following:    Bacteria, UA MANY (*)    All other components within normal limits  URINE RAPID DRUG SCREEN (HOSP PERFORMED) - Abnormal; Notable for the following:    Opiates POSITIVE (*)    Benzodiazepines POSITIVE (*)    All other components within normal limits  VALPROIC ACID LEVEL - Abnormal; Notable for the following:    Valproic Acid Lvl 13.9 (*)    All other components within normal limits  COMPREHENSIVE METABOLIC PANEL - Abnormal; Notable for the following:    Glucose, Bld 109 (*)    GFR calc non Af Amer 81 (*)    All other components within normal limits  MRSA PCR SCREENING  URINE CULTURE  PROTIME-INR  ETHANOL  CBC WITH DIFFERENTIAL/PLATELET  TROPONIN I  I-STAT CHEM 8, ED  I-STAT TROPOININ, ED    Imaging Review Dg Chest 2 View  03/30/2014   CLINICAL DATA:  Weakness and confusion.  Status post fall today.  EXAM: CHEST  2 VIEW  COMPARISON:  PA and lateral chest 11/12/2013.  FINDINGS: Lungs are clear. Heart size is normal. No pneumothorax or pleural effusion. No acute bony abnormality is identified with scoliosis is again seen.  IMPRESSION: No acute disease.   Electronically Signed   By: Inge Rise M.D.   On: 03/30/2014 00:46   Ct Head Wo Contrast  03/30/2014   CLINICAL DATA:  Lightheadedness.  Fell with injury to nose.  EXAM: CT HEAD WITHOUT CONTRAST  CT MAXILLOFACIAL WITHOUT CONTRAST  TECHNIQUE: Multidetector CT imaging of the head and maxillofacial structures were performed using the standard protocol without intravenous contrast. Multiplanar CT  image reconstructions of the maxillofacial structures were also generated.  COMPARISON:  None.  03/06/2014  FINDINGS: CT HEAD FINDINGS  There is no intracranial hemorrhage or extra-axial fluid collection. There is moderate generalized atrophy and chronic microvascular disease, unchanged from 03/06/2014. The calvarium and skullbase are intact.  CT MAXILLOFACIAL FINDINGS  There are nondisplaced nasal fractures. No other fractures are evident. Orbital floors are intact. Zygomatic arches and pterygoid plates are intact. Mandible is intact.  IMPRESSION: No evidence of acute intracranial traumatic injury. Atrophy and chronic microvascular ischemic disease again evident. Nondisplaced nasal bone fractures.   Electronically Signed   By: Andreas Newport M.D.   On: 03/30/2014 00:44   Ct Cervical Spine Wo Contrast  03/30/2014   CLINICAL DATA:  Fall  EXAM: CT CERVICAL SPINE WITHOUT CONTRAST  TECHNIQUE: Multidetector CT imaging of the cervical spine was performed without intravenous contrast. Multiplanar CT image reconstructions were also generated.  COMPARISON:  03/06/2014  FINDINGS: C1 through the cervicothoracic junction is visualized in its entirety. Again noted is apparent fusion of C3-C4 and C5-C6. 3 mm anterolisthesis of C4 on C5 reidentified. Facet osteoarthritic change noted, most prominent at C4-C5. No new fracture or dislocation is identified. No precervical soft tissue widening. No malalignment otherwise. Biapical pleural thickening noted.  IMPRESSION: Stable findings as above without acute osseous abnormality of the cervical spine.   Electronically Signed   By: Conchita Paris M.D.   On: 03/30/2014 15:01   Ct Maxillofacial Wo Cm  03/30/2014   CLINICAL DATA:  Lightheadedness.  Fell with injury to nose.  EXAM: CT HEAD WITHOUT CONTRAST  CT MAXILLOFACIAL WITHOUT CONTRAST  TECHNIQUE: Multidetector CT imaging of the head and maxillofacial structures were performed using the standard protocol without intravenous  contrast. Multiplanar CT image reconstructions of the maxillofacial structures were also generated.  COMPARISON:  None.  03/06/2014  FINDINGS: CT HEAD FINDINGS  There is no intracranial hemorrhage or extra-axial fluid collection. There is moderate generalized atrophy and chronic microvascular disease, unchanged from 03/06/2014. The calvarium and skullbase are intact.  CT MAXILLOFACIAL FINDINGS  There are nondisplaced nasal fractures. No other fractures are evident. Orbital floors are intact. Zygomatic arches and pterygoid plates are intact. Mandible is intact.  IMPRESSION: No evidence of acute intracranial traumatic injury. Atrophy and chronic microvascular ischemic disease again evident. Nondisplaced nasal bone fractures.   Electronically Signed   By: Andreas Newport M.D.   On: 03/30/2014 00:44     EKG Interpretation   Date/Time:  Tuesday March 30 2014 00:47:31 EST Ventricular Rate:  72 PR Interval:  141 QRS Duration: 97 QT Interval:  424 QTC Calculation: 464 R Axis:   -36 Text Interpretation:  Sinus rhythm borderline LAD RSR' in V1 or V2, right  VCD or RVH No significant change since last tracing Confirmed by Glynn Octave [phone]) on 03/30/2014 4:04:08 PM      MDM   Final diagnoses:  Fall    Patient presents emergency department for dizziness and falling. Patient has multiple visits of the same documented in her chart. She also has a history of vertigo. Her neurological exam is currently normal. Will assess with CT head and face.    Workup reveal UTI, nondisplaced nasal bone fracture.  She was given ceftriaxone.  BP is high here compared to previous visits, [id number] systolic.  Along with dizziness, patient will require admission for monitoring.  Discussed cased with hospitalist who recs to give hydralazine 2mg  and admit to tele.    Everlene Balls, MD 03/30/14 1606

## 2014-03-30 NOTE — Care Management Note (Addendum)
    Page 1 of 1   04/02/2014     11:38:12 AM CARE MANAGEMENT NOTE 04/02/2014  Patient:  Taylor Roth, Taylor Roth   Account Number:  [id number]  Date Initiated:  03/30/2014  Documentation initiated by:  Dessa Phi  Subjective/Objective Assessment:   81 y/o f admitted w/fall,htn emergency,uti.     Action/Plan:   From ALF-[facility]   Anticipated DC Date:  04/02/2014   Anticipated DC Plan:  ASSISTED LIVING / [place]  CM consult      Choice offered to / List presented to:  C-1 Patient        [place] arranged  [facility]      [place]   Status of service:  Completed, signed off Medicare Important Message given?  YES (If response is "NO", the following Medicare IM given date fields will be blank) Date Medicare IM given:  04/02/2014 Medicare IM given by:  [facility] Date Additional Medicare IM given:   Additional Medicare IM given by:    Discharge Disposition:  [facility]  Per UR Regulation:  Reviewed for med. necessity/level of care/duration of stay  If discussed at [place] of Stay Meetings, dates discussed:    Comments:  04/02/14 Dessa Phi RN BSN NCM [phone] PT-HHPT.patient declines snf, wants to return to [facility], [place] aware & following.[facility] facility uses,  rep [facility] aware & following for HHPT order,& face to face.  03/30/14 Dessa Phi RN BSN NCM [phone] PT ordered. Await recommendations.

## 2014-03-30 NOTE — ED Notes (Signed)
IV infused ABT without difficulty. Pt had no adverse effects noted.

## 2014-03-30 NOTE — Progress Notes (Signed)
Clinical Social Work Department BRIEF PSYCHOSOCIAL ASSESSMENT 03/30/2014  Patient:  Taylor Roth, Taylor Roth     Account Number:  [id number]     Admit date:  03/29/2014  Clinical Social Worker:  Glorious Peach, CLINICAL SOCIAL WORKER  Date/Time:  03/30/2014 10:41 AM  Referred by:  Physician  Date Referred:  03/30/2014 Referred for  SNF Placement   Other Referral:   Interview type:  Patient Other interview type:   and RN at ALF.    PSYCHOSOCIAL DATA Living Status:  FACILITY Admitted from facility:  [facility] Level of care:  Assisted Living Primary support name:  Taylor Roth Primary support relationship to patient:  CHILD, ADULT Degree of support available:   Adequate    CURRENT CONCERNS Current Concerns  Post-Acute Placement   Other Concerns:    SOCIAL WORK ASSESSMENT / PLAN CSW spoke with patient regarding placement after DC, patient returning to ALF versus SNF.   Assessment/plan status:  Information/Referral to [employer] Other assessment/ plan:   Information/referral to community resources:   CSW to contact ALF to make sure patient will be able to return after DC.    PATIENT'S/FAMILY'S RESPONSE TO PLAN OF CARE: CSW spoke with RN at [facility] via phone and patient at bedside. CSW introduced self and explained role. CSW inquired about patient's baseline at ALF. RN stated that patient was completely independent, got around by foot, fed herself, as well as dressed herself. RN stated that they will accept patient back when medically stable and ready for DC. CSW spoke with patient at bedside, assessing patient's needs. Patient states that she is fine and is ready to go back to ALF. Patient states that she will need more assistance when she returns to ALF, after her injury and will let them know once she arrives. CSW encouraged patient to take it easy when she returns to ALF, and to ask for help when she needs it, so that she will not experience any other injuries.  CSW will continue to follow for DC plans, as we await PT.       Glorious Peach BSW Intern

## 2014-03-30 NOTE — Evaluation (Signed)
Physical Therapy Evaluation Patient Details Name: Taylor Roth MRN: [id number] DOB: 09-20-1928 Today's Date: 03/30/2014   History of Present Illness  81 yo female admitted with hypertensive emergency, fall, nasal bone fx. Hx of chronic orthostatic hypotension, COPD, bipolar, osteoporosis. Pt is from ALF  Clinical Impression  On eval, pt required Min assist for mobility-able to take a few steps in room without walker; walked ~50 feet with RW. Assist to steady/stabilize. Mod encouragement for participation. Discussed d/c plan-recommend ST rehab at SNF if pt will agree. If pt returns to ALF, recommend supervision for OOB/mobility and HHPT. Encouraged pt and daughter to have discussion with facility reps to determine level of care they can provide for pt.     Follow Up Recommendations SNF(if pt will agree).If not agreeable, then HHPT and supervision for OOB/mobility at ALF    Equipment Recommendations  None recommended by PT (pt already has DME per daughter (pt chooses not to use))    Recommendations for Other Services       Precautions / Restrictions Precautions Precautions: Fall Restrictions Weight Bearing Restrictions: No      Mobility  Bed Mobility Overal bed mobility: Needs Assistance Bed Mobility: Supine to Sit;Sit to Supine     Supine to sit: Supervision Sit to supine: Supervision   General bed mobility comments: supervision for lines, safety  Transfers Overall transfer level: Needs assistance   Transfers: Sit to/from Stand Sit to Stand: Min assist         General transfer comment: Assist to rise, stabilize, control descent. VCs for pt to sit EOB for a short while before standing.   Ambulation/Gait Ambulation/Gait assistance: Min assist Ambulation Distance (Feet): 50 Feet Assistive device: Rolling walker (2 wheeled) Gait Pattern/deviations: Trunk flexed;Decreased stride length     General Gait Details: Assist to stabilize and maneuver with RW. VCs safe  use of RW.   Stairs            Wheelchair Mobility    Modified Rankin (Stroke Patients Only)       Balance Overall balance assessment: Needs assistance;History of Falls Sitting-balance support: Bilateral upper extremity supported;Feet supported Sitting balance-Leahy Scale: Good     Standing balance support: No upper extremity supported;During functional activity Standing balance-Leahy Scale: Fair                               Pertinent Vitals/Pain Pain Assessment: Faces Faces Pain Scale: Hurts even more Pain Location: back Pain Descriptors / Indicators: Aching Pain Intervention(s): Monitored during session;Repositioned    Home Living Family/patient expects to be discharged to:: Private residence     Type of Home: Assisted living         Home Equipment: Gilford Rile - 2 wheels;Cane - single point      Prior Function Level of Independence: Independent         Comments: pt has DME but does not use     Hand Dominance        Extremity/Trunk Assessment   Upper Extremity Assessment: Defer to OT evaluation           Lower Extremity Assessment: Generalized weakness      Cervical / Trunk Assessment: Kyphotic  Communication   Communication: No difficulties  Cognition Arousal/Alertness: Awake/alert Behavior During Therapy: WFL for tasks assessed/performed Overall Cognitive Status: Within Functional Limits for tasks assessed  General Comments      Exercises        Assessment/Plan    PT Assessment Patient needs continued PT services  PT Diagnosis Difficulty walking;Generalized weakness   PT Problem List Decreased strength;Decreased activity tolerance;Decreased balance;Decreased mobility;Decreased knowledge of use of DME;Pain  PT Treatment Interventions DME instruction;Gait training;Functional mobility training;Therapeutic activities;Therapeutic exercise;Patient/family education;Balance training   PT  Goals (Current goals can be found in the Care Plan section) Acute Rehab PT Goals Patient Stated Goal: none stated PT Goal Formulation: With patient/family Time For Goal Achievement: 04/13/14 Potential to Achieve Goals: Good    Frequency Min 3X/week   Barriers to discharge        Co-evaluation               End of Session Equipment Utilized During Treatment: Gait belt Activity Tolerance: Patient limited by fatigue Patient left: in bed;with call bell/phone within reach;with family/visitor present;with bed alarm set           Time: 1455-1525 PT Time Calculation (min) (ACUTE ONLY): 30 min   Charges:   PT Evaluation $Initial PT Evaluation Tier I: 1 Procedure PT Treatments $Gait Training: 8-22 mins   PT G Codes:        [place] Anna, MPT Pager: [phone]

## 2014-03-30 NOTE — ED Notes (Signed)
Patient transported to CT/Xray. 

## 2014-03-30 NOTE — ED Notes (Signed)
Admitting MD at bedside.

## 2014-03-30 NOTE — ED Notes (Signed)
In and out not done d/t pt continent of urine and was able to void in bedpan.

## 2014-03-30 NOTE — ED Notes (Signed)
Patient returned from CT/X-ray without distress noted.

## 2014-03-31 DIAGNOSIS — R42 Dizziness and giddiness: Secondary | ICD-10-CM

## 2014-03-31 LAB — CBC
HCT: 39.3 % (ref 36.0–46.0)
Hemoglobin: 12.4 g/dL (ref 12.0–15.0)
MCH: 28.7 pg (ref 26.0–34.0)
MCHC: 31.6 g/dL (ref 30.0–36.0)
MCV: 91 fL (ref 78.0–100.0)
Platelets: 180 10*3/uL (ref 150–400)
RBC: 4.32 MIL/uL (ref 3.87–5.11)
RDW: 14.8 % (ref 11.5–15.5)
WBC: 5.3 10*3/uL (ref 4.0–10.5)

## 2014-03-31 LAB — BASIC METABOLIC PANEL
Anion gap: 9 (ref 5–15)
BUN: 15 mg/dL (ref 6–23)
CALCIUM: 8.8 mg/dL (ref 8.4–10.5)
CO2: 27 mmol/L (ref 19–32)
Chloride: 108 mmol/L (ref 96–112)
Creatinine, Ser: 0.7 mg/dL (ref 0.50–1.10)
GFR calc Af Amer: >90 mL/min (ref >90)
GFR calc non Af Amer: 78 mL/min — ABNORMAL LOW (ref >90)
GLUCOSE: 103 mg/dL — AB (ref 70–99)
Potassium: 3.6 mmol/L (ref 3.5–5.1)
SODIUM: 144 mmol/L (ref 135–145)

## 2014-03-31 MED ORDER — MIRTAZAPINE 15 MG PO TABS
15.0000 mg | ORAL_TABLET | Freq: Every day | ORAL | Status: DC
  Administered 2014-03-31: 15 mg via ORAL
  Filled 2014-03-31 (×2): qty 1

## 2014-03-31 NOTE — Evaluation (Signed)
Occupational Therapy Evaluation Patient Details Name: Taylor Roth MRN: [id number] DOB: Apr 29, 1928 Today's Date: 03/31/2014    History of Present Illness 81 yo female admitted with hypertensive emergency, fall, nasal bone fx. Hx of chronic orthostatic hypotension, COPD, bipolar, osteoporosis. Pt is from ALF   Clinical Impression   Pt admitted with HTN. Pt currently with functional limitations due to the deficits listed below (see OT Problem List).  Pt will benefit from skilled OT to increase their safety and independence with ADL and functional mobility for ADL to facilitate discharge to venue listed below.      Follow Up Recommendations  Home health OT;Other (comment);SNF (at ALF)    Equipment Recommendations  None recommended by OT    Recommendations for Other Services       Precautions / Restrictions Restrictions Weight Bearing Restrictions: No      Mobility Bed Mobility Overal bed mobility: Needs Assistance Bed Mobility: Supine to Sit;Sit to Supine     Supine to sit: Supervision Sit to supine: Supervision   General bed mobility comments: supervision for lines, safety  Transfers Overall transfer level: Needs assistance     Sit to Stand: Min assist         General transfer comment: Assist to rise, stabilize, control descent. VCs for pt to sit EOB for a short while before standing.          ADL Overall ADL's : Needs assistance/impaired Eating/Feeding: Set up;Sitting   Grooming: Set up;Sitting   Upper Body Bathing: Set up;Sitting   Lower Body Bathing: Minimal assistance;Sit to/from stand   Upper Body Dressing : Set up;Sitting   Lower Body Dressing: Minimal assistance;Sit to/from stand   Toilet Transfer: Minimal assistance   Toileting- Clothing Manipulation and Hygiene: Sit to/from stand       Functional mobility during ADLs: Minimal assistance General ADL Comments: overall min A.  STNF or ALF depending on what pts wants to do                   Hand Dominance     Extremity/Trunk Assessment Upper Extremity Assessment Upper Extremity Assessment: Generalized weakness           Communication Communication Communication: No difficulties   Cognition Arousal/Alertness: Awake/alert Behavior During Therapy: WFL for tasks assessed/performed Overall Cognitive Status: Within Functional Limits for tasks assessed                     General Comments       Exercises       Shoulder Instructions      Home Living Family/patient expects to be discharged to:: Private residence     Type of Home: Assisted living                       Home Equipment: Gilford Rile - 2 wheels;Cane - single point          Prior Functioning/Environment Level of Independence: Independent        Comments: pt has DME but does not use    OT Diagnosis: Generalized weakness   OT Problem List: Decreased strength;Decreased activity tolerance   OT Treatment/Interventions: Self-care/ADL training    OT Goals(Current goals can be found in the care plan section) Acute Rehab OT Goals Patient Stated Goal: back to ALF OT Goal Formulation: With patient Time For Goal Achievement: 04/14/14 Potential to Achieve Goals: Good ADL Goals Pt Will Perform Grooming: with supervision;standing Pt Will Perform Lower Body Dressing: with  supervision;sit to/from stand Pt Will Transfer to Toilet: with supervision;ambulating;regular height toilet Pt Will Perform Toileting - Clothing Manipulation and hygiene: with supervision;sit to/from stand  OT Frequency: Min 2X/week   Barriers to D/C:            Co-evaluation              End of Session Nurse Communication: Mobility status  Activity Tolerance: Patient tolerated treatment well Patient left: in bed   Time: 0920-0945 OT Time Calculation (min): 25 min Charges:  OT General Charges $OT Visit: 1 Procedure OT Evaluation $Initial OT Evaluation Tier I: 1 Procedure OT  Treatments $Self Care/Home Management : 8-22 mins G-Codes:    Payton Mccallum D April 05, 2014, 9:46 AM

## 2014-03-31 NOTE — Progress Notes (Addendum)
Patient ID: Taylor Roth, female   DOB: 10-Jan-1929, 81 y.o.   MRN: [id number]  [name] PROGRESS NOTE  Taylor Roth [id number] DOB: 08-Feb-1929 DOA: 03/29/2014 PCP: No primary care provider on file.   Brief narrative:    81 y.o. female with history of chronic orthostatic hypotension on fludrocortisone, COPD, bipolar disorder, protein calorie malnutrition was brought to the ER after patient had a fall in the nursing home. Patient states that she has been feeling dizzy off and on and one day PTA she fell down on her face while walking.   In the ER, pt was hemodynamically stable but with SBP > 160's (responded well to Hydralazine 2 mg IV given in ED), CT head and maxillofacial done showed nondisplaced nasal bone fracture.  Assessment/Plan:    Dizziness with history of orthostatic hypotension - pt reports feeling better this AM, has not gotten up since admission - she is at home on significant meds: Seroquel 150 mg QD, mirtazapine QHS 45 mg, triazolam QHS, clorazepate, divalproex - will lower the dose of QHS med Mirtazapine to 15 mm  - will recheck orthostatic today and will ask PT to evaluate pt - continue IVF, fludrocortisone for now - pt denies chest pain or shortness of breath this AM UTI - placed on Rocephin IV for now - follow up on urine cultures  Hypertensive urgency - given one dose of Hydralazine IV in ED and responded well - SBP is in 140's this AM - continue to monitor  Nasal bone fracture nondisplaced  - no indication for intervention per ENT recommendations - continue conservative management - apply ice as needed  Bipolar disorder  - continue present medications. COPD - stable respiratory status  Severe PCM - nutritionist consulted Underweight  - Body mass index is 19 DVT prophylaxis - SCD's  Code Status: Full.  Family Communication:  plan of care discussed with the patient Disposition Plan: not ready for d/c at this time   IV access:   Peripheral IV  Procedures and diagnostic studies:    Dg Chest 2 View  03/30/2014  No acute disease.    Ct Head Wo Contrast 03/30/2014  No evidence of acute intracranial traumatic injury. Atrophy and chronic microvascular ischemic disease. Nondisplaced nasal bone fractures.     Ct Head Wo Contrast  03/07/2014  No acute intracranial injury or cervical spine fracture. 2. Multilevel spinal fusion with advanced anterolisthesis and facet arthritis at C4-5.     Ct Cervical Spine Wo Contrast  03/30/2014  Stable findings as above without acute osseous abnormality of the cervical spine.     Ct Cervical Spine Wo Contrast  03/07/2014    No acute intracranial injury or cervical spine fracture. Multilevel spinal fusion with advanced anterolisthesis and facet arthritis at C4-5.    Ct Maxillofacial Wo Cm  03/30/2014  No evidence of acute intracranial traumatic injury. Atrophy and chronic microvascular ischemic disease. Nondisplaced nasal bone fractures.     Medical Consultants:  None  Other Consultants:  None   IAnti-Infectives:   Rocephin 2/16 -->  Faye Ramsay, MD  [facility] Pager [phone]  If 7PM-7AM, please contact night-coverage [email or web address] Password [email or web address] 03/31/2014, 3:07 PM   LOS: 1 day   HPI/Subjective: No events overnight.   Objective: Filed Vitals:   03/30/14 1430 03/30/14 2042 03/31/14 0447 03/31/14 1354  BP: 147/81 155/73 125/73 145/92  Pulse: 77 73 78 68  Temp: 98.8 F (37.1 C) 97.7 F (36.5 C) 97.9 F (36.6 C) 98.2 F (36.8 C)  TempSrc: Oral Oral Oral Oral  Resp: 16 16 18 18   Height:      Weight:      SpO2: 98% 99% 97% 99%    Intake/Output Summary (Last 24 hours) at 03/31/14 1507 Last data filed at 03/31/14 1000  Gross per 24 hour  Intake 1664.17 ml  Output    900 ml  Net 764.17 ml    Exam:   General:  Pt is alert, follows commands appropriately, not in acute distress, periorbital ecchymoses, nasal bruising   Cardiovascular: Regular rate and rhythm, no  rubs, no gallops  Respiratory: Clear to auscultation bilaterally, no wheezing, no crackles, no rhonchi  Abdomen: Soft, non tender, non distended, bowel sounds present, no guarding  Extremities:  pulses DP and PT palpable bilaterally  Neuro: Grossly nonfocal  Data Reviewed: Basic Metabolic Panel:  Recent Labs Lab 03/30/14 0029 03/30/14 0600 03/31/14 0544  NA 143 144 144  K 3.6 3.6 3.6  CL 105 108 108  CO2  --  27 27  GLUCOSE 97 109* 103*  BUN 20 15 15   CREATININE 0.80 0.63 0.70  CALCIUM  --  9.3 8.8   Liver Function Tests:  Recent Labs Lab 03/30/14 0600  AST 25  ALT 14  ALKPHOS 88  BILITOT 0.3  PROT 7.1  ALBUMIN 4.1   CBC:  Recent Labs Lab 03/30/14 0019 03/30/14 0029 03/30/14 0600 03/31/14 0544  WBC 4.8  --  7.4 5.3  NEUTROABS 2.2  --  5.3  --   HGB 12.0 13.3 13.3 12.4  HCT 38.0 39.0 41.4 39.3  MCV 91.8  --  89.6 91.0  PLT 183  --  186 180   Cardiac Enzymes:  Recent Labs Lab 03/30/14 0600  TROPONINI 0.03   Recent Results (from the past 240 hour(s))  MRSA PCR Screening     Status: None   Collection Time: 03/30/14  5:25 AM  Result Value Ref Range Status   MRSA by PCR NEGATIVE NEGATIVE Final    Scheduled Meds: . cefTRIAXone  IV  1 g Intravenous QHS  . clorazepate  7.5 mg Oral BID  . divalproex  250 mg Oral Daily  . docusate sodium  100 mg Oral BID  . dorzolamide-timolol  1 drop Both Eyes BID  . famotidine  20 mg Oral BID  . fludrocortisone  0.1 mg Oral q morning - 10a  . hydrocortisone  1 application Rectal BID  . latanoprost  1 drop Both Eyes QHS  . mirtazapine  45 mg Oral QHS  . polyethylene glycol  17 g Oral Daily  . QUEtiapine Fumarate  150 mg Oral Q2000  . triazolam  0.5 mg Oral QHS   Continuous Infusions:

## 2014-04-01 LAB — URINE CULTURE
Colony Count: NO GROWTH
Culture: NO GROWTH

## 2014-04-01 LAB — BASIC METABOLIC PANEL
Anion gap: 8 (ref 5–15)
BUN: 14 mg/dL (ref 6–23)
CALCIUM: 8.7 mg/dL (ref 8.4–10.5)
CHLORIDE: 105 mmol/L (ref 96–112)
CO2: 29 mmol/L (ref 19–32)
CREATININE: 0.67 mg/dL (ref 0.50–1.10)
GFR calc non Af Amer: 79 mL/min — ABNORMAL LOW (ref >90)
Glucose, Bld: 100 mg/dL — ABNORMAL HIGH (ref 70–99)
Potassium: 3.9 mmol/L (ref 3.5–5.1)
Sodium: 142 mmol/L (ref 135–145)

## 2014-04-01 LAB — CBC
HCT: 37.8 % (ref 36.0–46.0)
Hemoglobin: 11.9 g/dL — ABNORMAL LOW (ref 12.0–15.0)
MCH: 28.5 pg (ref 26.0–34.0)
MCHC: 31.5 g/dL (ref 30.0–36.0)
MCV: 90.4 fL (ref 78.0–100.0)
Platelets: 154 10*3/uL (ref 150–400)
RBC: 4.18 MIL/uL (ref 3.87–5.11)
RDW: 14.5 % (ref 11.5–15.5)
WBC: 5.3 10*3/uL (ref 4.0–10.5)

## 2014-04-01 MED ORDER — MIRTAZAPINE 15 MG PO TABS
15.0000 mg | ORAL_TABLET | Freq: Every day | ORAL | Status: DC
  Administered 2014-04-01 – 2014-04-02 (×2): 15 mg via ORAL
  Filled 2014-04-01 (×3): qty 1

## 2014-04-01 MED ORDER — CLORAZEPATE DIPOTASSIUM 7.5 MG PO TABS: 7.5000 mg | ORAL_TABLET | Freq: Two times a day (BID) | ORAL | Status: DC

## 2014-04-01 MED ORDER — CLORAZEPATE DIPOTASSIUM 7.5 MG PO TABS
7.5000 mg | ORAL_TABLET | Freq: Two times a day (BID) | ORAL | Status: DC
Start: 2014-04-01 — End: 2014-04-03
  Administered 2014-04-01 – 2014-04-03 (×4): 7.5 mg via ORAL
  Filled 2014-04-01 (×4): qty 1

## 2014-04-01 MED ORDER — LOPERAMIDE HCL 2 MG PO CAPS
2.0000 mg | ORAL_CAPSULE | ORAL | Status: DC | PRN
  Filled 2014-04-01: qty 1

## 2014-04-01 MED ORDER — TRIAZOLAM 0.125 MG PO TABS
0.5000 mg | ORAL_TABLET | Freq: Every day | ORAL | Status: DC
  Administered 2014-04-01 – 2014-04-02 (×2): 0.5 mg via ORAL
  Filled 2014-04-01 (×2): qty 4

## 2014-04-01 NOTE — Progress Notes (Signed)
Physical Therapy Treatment Patient Details Name: MAEBEL TEGETHOFF MRN: [id number] DOB: November 26, 1928 Today's Date: 04/01/2014    History of Present Illness 81 yo female admitted with hypertensive emergency, fall, nasal bone fx. Hx of chronic orthostatic hypotension, COPD, bipolar, osteoporosis. Pt is from ALF    PT Comments    Progressing well with mobility. Pt walked ~175 feet with RW and ~15' to and from bathroom without RW. Discussed d/c plan-pt is adamant about not going to SNF. Discussed pt using 4 wheeled walker and having HHPT come out to ALF to work with her-pt somewhat agreeable. Continue to recommend for increased supervision when mobilizing, especially when bathing/showering.   Follow Up Recommendations  Home health PT at ALF (Pt refusing placement); Supervision for OOB/mobility     Equipment Recommendations   (pt agreeable to use 4 wheeled rollator)    Recommendations for Other Services       Precautions / Restrictions Precautions Precautions: Fall Restrictions Weight Bearing Restrictions: No    Mobility  Bed Mobility Overal bed mobility: Needs Assistance Bed Mobility: Supine to Sit;Sit to Supine     Supine to sit: Modified independent (Device/Increase time) Sit to supine: Modified independent (Device/Increase time)      Transfers Overall transfer level: Needs assistance Equipment used: None;Rolling walker (2 wheeled) Transfers: Sit to/from Stand           General transfer comment: close guard for safety.   Ambulation/Gait Ambulation/Gait assistance: Min guard Ambulation Distance (Feet): 175 Feet Assistive device: Rolling walker (2 wheeled) Gait Pattern/deviations: Step-through pattern     General Gait Details: close guard for safety. No LOB with use of walker. Pt denied dizziness. Tolerated activity well.    Stairs            Wheelchair Mobility    Modified Rankin (Stroke Patients Only)       Balance                                     Cognition Arousal/Alertness: Awake/alert Behavior During Therapy: WFL for tasks assessed/performed Overall Cognitive Status: Within Functional Limits for tasks assessed                      Exercises      General Comments        Pertinent Vitals/Pain Pain Assessment: Faces Faces Pain Scale: Hurts little more Pain Location: L knee Pain Descriptors / Indicators: Aching;Sore Pain Intervention(s): Monitored during session;Repositioned    Home Living                      Prior Function            PT Goals (current goals can now be found in the care plan section) Progress towards PT goals: Progressing toward goals    Frequency  Min 3X/week    PT Plan Current plan remains appropriate    Co-evaluation             End of Session Equipment Utilized During Treatment: Gait belt Activity Tolerance: Patient tolerated treatment well Patient left: in bed;with call bell/phone within reach;with bed alarm set     Time: [id number] PT Time Calculation (min) (ACUTE ONLY): 24 min  Charges:  $Gait Training: 8-22 mins                    G Codes:  Weston Anna, MPT Pager: [phone]
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
## 2014-04-01 NOTE — Progress Notes (Addendum)
Patient ID: Taylor Roth, female   DOB: August 11, 1928, 81 y.o.   MRN: [id number]  [name] PROGRESS NOTE  Taylor Roth [id number] DOB: 12/02/1928 DOA: 03/29/2014 PCP: No primary care provider on file.   Brief narrative:    81 y.o. female with history of chronic orthostatic hypotension on fludrocortisone, COPD, bipolar disorder, protein calorie malnutrition was brought to the ER after patient had a fall in the nursing home. Patient states that she has been feeling dizzy off and on and one day PTA she fell down on her face while walking.   In the ER, pt was hemodynamically stable but with SBP > 160's (responded well to Hydralazine 2 mg IV given in ED), CT head and maxillofacial done showed nondisplaced nasal bone fracture.  Assessment/Plan:    Dizziness with history of orthostatic hypotension - pt reports feeling better this AM, says she walked yesterday with assistance in the hallway  - she is at home on significant meds: Seroquel 150 mg QD, mirtazapine QHS 45 mg, triazolam QHS, clorazepate, divalproex - lowered the dose of QHS med Mirtazapine to 15 mm  - continue IVF, fludrocortisone for now - pt denies chest pain or shortness of breath this AM UTI - placed on Rocephin IV for now - follow up on urine cultures  Hypertensive urgency - given one dose of Hydralazine IV in ED and responded well - SBP is in 140's this AM - continue to monitor  Nasal bone fracture nondisplaced  - no indication for intervention per ENT recommendations - continue conservative management - apply ice as needed  Bipolar disorder  - continue present medications. COPD - stable respiratory status  Severe PCM - nutritionist consulted Underweight  - Body mass index is 19 DVT prophylaxis - SCD's  Code Status: Full.  Family Communication: plan of care discussed with the patient, called daughter over the phone  Disposition Plan: not ready for d/c at this time    IV access:   Peripheral IV  Procedures and diagnostic studies:    Dg Chest 2 View 03/30/2014 No acute disease.   Ct Head Wo Contrast 03/30/2014 No evidence of acute intracranial traumatic injury. Atrophy and chronic microvascular ischemic disease. Nondisplaced nasal bone fractures.   Ct Head Wo Contrast 03/07/2014 No acute intracranial injury or cervical spine fracture. 2. Multilevel spinal fusion with advanced anterolisthesis and facet arthritis at C4-5.   Ct Cervical Spine Wo Contrast 03/30/2014 Stable findings as above without acute osseous abnormality of the cervical spine.   Ct Cervical Spine Wo Contrast 03/07/2014 No acute intracranial injury or cervical spine fracture. Multilevel spinal fusion with advanced anterolisthesis and facet arthritis at C4-5.   Ct Maxillofacial Wo Cm 03/30/2014 No evidence of acute intracranial traumatic injury. Atrophy and chronic microvascular ischemic disease. Nondisplaced nasal bone fractures.   Medical Consultants:  None  Other Consultants:  None   IAnti-Infectives:   Rocephin 2/16 -->  Faye Ramsay, MD  [facility] Pager [phone]  If 7PM-7AM, please contact night-coverage [email or web address] Password [facility] 04/01/2014, 4:19 PM   LOS: 2 days   HPI/Subjective: No events overnight.   Objective: Filed Vitals:   03/31/14 2108 03/31/14 2111 04/01/14 0535 04/01/14 1339  BP: 145/78 153/88 153/87 142/83  Pulse: 76 82 73 68  Temp:   98 F (36.7 C) 97.4 F (36.3 C)  TempSrc:   Oral Oral  Resp: 18 18 18 18   Height:      Weight:      SpO2: 99% 99% 98% 98%  Intake/Output Summary (Last 24 hours) at 04/01/14 1619 Last data filed at 04/01/14 1339  Gross per 24 hour  Intake    770 ml  Output   1200 ml  Net   -430 ml    Exam:   General:  Pt is alert, follows commands appropriately, not in acute distress  Cardiovascular: Regular rate and rhythm,  no rubs, no gallops  Respiratory: Clear to auscultation bilaterally, no wheezing, no  crackles, no rhonchi  Abdomen: Soft, non tender, non distended, bowel sounds present, no guarding  Extremities:  pulses DP and PT palpable bilaterally  Neuro: Grossly nonfocal  Data Reviewed: Basic Metabolic Panel:  Recent Labs Lab 03/30/14 0029 03/30/14 0600 03/31/14 0544 04/01/14 0520  NA 143 144 144 142  K 3.6 3.6 3.6 3.9  CL 105 108 108 105  CO2  --  27 27 29   GLUCOSE 97 109* 103* 100*  BUN 20 15 15 14   CREATININE 0.80 0.63 0.70 0.67  CALCIUM  --  9.3 8.8 8.7   Liver Function Tests:  Recent Labs Lab 03/30/14 0600  AST 25  ALT 14  ALKPHOS 88  BILITOT 0.3  PROT 7.1  ALBUMIN 4.1   CBC:  Recent Labs Lab 03/30/14 0019 03/30/14 0029 03/30/14 0600 03/31/14 0544 04/01/14 0520  WBC 4.8  --  7.4 5.3 5.3  NEUTROABS 2.2  --  5.3  --   --   HGB 12.0 13.3 13.3 12.4 11.9*  HCT 38.0 39.0 41.4 39.3 37.8  MCV 91.8  --  89.6 91.0 90.4  PLT 183  --  186 180 154   Cardiac Enzymes:  Recent Labs Lab 03/30/14 0600  TROPONINI 0.03    Recent Results (from the past 240 hour(s))  MRSA PCR Screening     Status: None   Collection Time: 03/30/14  5:25 AM  Result Value Ref Range Status   MRSA by PCR NEGATIVE NEGATIVE Final    Comment:        The GeneXpert MRSA Assay (FDA approved for NASAL specimens only), is one component of a comprehensive MRSA colonization surveillance program. It is not intended to diagnose MRSA infection nor to guide or monitor treatment for MRSA infections.   Culture, Urine     Status: None   Collection Time: 03/30/14  2:51 PM  Result Value Ref Range Status   Specimen Description URINE, CLEAN CATCH  Final   Special Requests NONE  Final   Colony Count NO GROWTH Performed at [facility]   Final   Culture NO GROWTH Performed at [facility]   Final   Report Status 04/01/2014 FINAL  Final     Scheduled Meds: . cefTRIAXone (ROCEPHIN)  IV  1 g Intravenous QHS  . cholecalciferol  1,000 Units Oral Daily  . clorazepate   7.5 mg Oral BID  . divalproex  250 mg Oral Daily  . docusate sodium  100 mg Oral BID  . dorzolamide-timolol  1 drop Both Eyes BID  . famotidine  20 mg Oral BID  . fludrocortisone  0.1 mg Oral q morning - 10a  . hydrocortisone  1 application Rectal BID  . latanoprost  1 drop Both Eyes QHS  . mirtazapine  15 mg Oral QHS  . polyethylene glycol  17 g Oral Daily  . QUEtiapine Fumarate  150 mg Oral Q2000  . sodium chloride  3 mL Intravenous Q12H  . triazolam  0.5 mg Oral QHS   Continuous Infusions:

## 2014-04-02 LAB — BASIC METABOLIC PANEL
Anion gap: 6 (ref 5–15)
BUN: 18 mg/dL (ref 6–23)
CO2: 29 mmol/L (ref 19–32)
CREATININE: 0.68 mg/dL (ref 0.50–1.10)
Calcium: 8.9 mg/dL (ref 8.4–10.5)
Chloride: 108 mmol/L (ref 96–112)
GFR, EST NON AFRICAN AMERICAN: 79 mL/min — AB (ref >90)
Glucose, Bld: 98 mg/dL (ref 70–99)
Potassium: 4.2 mmol/L (ref 3.5–5.1)
SODIUM: 143 mmol/L (ref 135–145)

## 2014-04-02 LAB — CBC
HCT: 36.6 % (ref 36.0–46.0)
HEMOGLOBIN: 11.7 g/dL — AB (ref 12.0–15.0)
MCH: 28.7 pg (ref 26.0–34.0)
MCHC: 32 g/dL (ref 30.0–36.0)
MCV: 89.9 fL (ref 78.0–100.0)
Platelets: 145 10*3/uL — ABNORMAL LOW (ref 150–400)
RBC: 4.07 MIL/uL (ref 3.87–5.11)
RDW: 14.3 % (ref 11.5–15.5)
WBC: 5.4 10*3/uL (ref 4.0–10.5)

## 2014-04-02 MED ORDER — HYDROCODONE-ACETAMINOPHEN 10-325 MG PO TABS: 1.0000 | ORAL_TABLET | Freq: Four times a day (QID) | ORAL | Status: DC | PRN

## 2014-04-02 NOTE — Progress Notes (Signed)
Occupational Therapy Treatment Patient Details Name: Taylor Roth MRN: [id number] DOB: 1928-11-08 Today's Date: 04/02/2014    History of present illness 81 yo female admitted with hypertensive emergency, fall, nasal bone fx. Hx of chronic orthostatic hypotension, COPD, bipolar, osteoporosis. Pt is from ALF   OT comments  Pt wants to go back to ALF  Follow Up Recommendations  Home health OT;Supervision - Intermittent    Equipment Recommendations  None recommended by OT       Precautions / Restrictions Precautions Precautions: Fall Restrictions Weight Bearing Restrictions: No       Mobility Bed Mobility               General bed mobility comments: pt in chair  Transfers Overall transfer level: Needs assistance Equipment used: Rolling walker (2 wheeled) Transfers: Sit to/from Stand Sit to Stand: Supervision              Balance                                   ADL                           Toilet Transfer: Min guard;RW   Toileting- Clothing Manipulation and Hygiene: Sit to/from stand;Min guard         General ADL Comments: overall S- min guard.  Pt and daughter prefer going back to ALF and having HH therapy there                Cognition   Behavior During Therapy: WFL for tasks assessed/performed Overall Cognitive Status: Within Functional Limits for tasks assessed                               General Comments      Pertinent Vitals/ Pain       Pain Assessment: No/denies pain         Frequency       Progress Toward Goals  OT Goals(current goals can now be found in the care plan section)  Progress towards OT goals: Progressing toward goals     Plan Discharge plan needs to be updated       End of Session     Activity Tolerance Patient tolerated treatment well   Patient Left in chair;with call bell/phone within reach;with chair alarm set;with family/visitor present   Nurse  Communication          Time: 1130-1144 OT Time Calculation (min): 14 min  Charges: OT General Charges $OT Visit: 1 Procedure OT Treatments $Self Care/Home Management : 8-22 mins  Albin Duckett D 04/02/2014, 11:50 AM

## 2014-04-02 NOTE — Progress Notes (Signed)
[place] for Rocephin Indication: UTI  Allergies  Allergen Reactions  . Macrobid [Nitrofurantoin Macrocrystal] Rash  . Azithromycin Other (See Comments)    unknown  . Doxycycline Other (See Comments)    unknown  . Escitalopram Oxalate Other (See Comments)    unknown  . Fioricet [Butalbital-Apap-Caffeine] Other (See Comments)    Drunk.  . Latex Other (See Comments)    unknown  . Levofloxacin Other (See Comments)    unknown  . Nsaids Other (See Comments)    Allergic reaction not listed on MAR.  Marland Kitchen Oxycodone Other (See Comments)    reaction to synthetic codeine  . Restasis [Cyclosporine] Other (See Comments)    unknown  . Sulfa Antibiotics Other (See Comments)    Reaction unknown  . Tylenol [Acetaminophen] Other (See Comments)    jaundice  . Biaxin [Clarithromycin] Rash    With burning sensation    Patient Measurements: Height: 5\' 2"  (157.5 cm) Weight: 105 lb 13.1 oz (48 kg) IBW/kg (Calculated) : 50.1   Vital Signs: Temp: 97.9 F (36.6 C) (02/19 0609) Temp Source: Oral (02/19 0609) BP: 163/83 mmHg (02/19 0609) Pulse Rate: 69 (02/19 0609) Intake/Output from previous day: 02/18 0701 - 02/19 0700 In: 650 [P.O.:600; IV Piggyback:50] Out: [id number] [Urine:1850] Intake/Output from this shift:    Labs:  Recent Labs  03/31/14 0544 04/01/14 0520 04/02/14 0354  WBC 5.3 5.3 5.4  HGB 12.4 11.9* 11.7*  PLT 180 154 145*  CREATININE 0.70 0.67 0.68   Estimated Creatinine Clearance: 40.4 mL/min (by C-G formula based on Cr of 0.68). No results for input(s): VANCOTROUGH, VANCOPEAK, VANCORANDOM, GENTTROUGH, GENTPEAK, GENTRANDOM, TOBRATROUGH, TOBRAPEAK, TOBRARND, AMIKACINPEAK, AMIKACINTROU, AMIKACIN in the last 72 hours.   Microbiology: Recent Results (from the past 720 hour(s))  Urine culture     Status: None   Collection Time: 03/06/14 11:40 PM  Result Value Ref Range Status   Specimen Description URINE, CLEAN CATCH  Final   Special  Requests NONE  Final   Colony Count NO GROWTH Performed at [facility]   Final   Culture NO GROWTH Performed at [facility]   Final   Report Status 03/08/2014 FINAL  Final  MRSA PCR Screening     Status: None   Collection Time: 03/30/14  5:25 AM  Result Value Ref Range Status   MRSA by PCR NEGATIVE NEGATIVE Final    Comment:        The GeneXpert MRSA Assay (FDA approved for NASAL specimens only), is one component of a comprehensive MRSA colonization surveillance program. It is not intended to diagnose MRSA infection nor to guide or monitor treatment for MRSA infections.   Culture, Urine     Status: None   Collection Time: 03/30/14  2:51 PM  Result Value Ref Range Status   Specimen Description URINE, CLEAN CATCH  Final   Special Requests NONE  Final   Colony Count NO GROWTH Performed at [facility]   Final   Culture NO GROWTH Performed at [facility]   Final   Report Status 04/01/2014 FINAL  Final   Assessment: 57 YOF presented to the [facility] on 03/29/14 for ALF and was admitted with UTI (dysuria and foul smelling urine). S/p fall dt dizziness- noted hematoma on forearm & epitaxis prior to EMS arrival. Hx of chronic orthostatic hypotension on fludrocortisone.  2/16 >>rocephin>>  Tmax: Afebrile WBCs: decreased to WNL  Renal: SCr WNL  2/16 Urine: NG MRSA PCR>>neg  Goal  of Therapy:  Treat UTI.  Plan:  Day #4 ceftriaxone  Continue Rocephin 1gm IV q24h, consider change to PO antibiotic, such as ceftin 500mg  PO BID when appropriate  No further adjustments needed  Doreene Eland, PharmD, BCPS.   Pager: [phone] 04/02/2014,8:47 AM

## 2014-04-02 NOTE — Progress Notes (Signed)
Physical Therapy Treatment Patient Details Name: Taylor Roth MRN: [id number] DOB: Feb 03, 1929 Today's Date: Apr 19, 2014    History of Present Illness 81 yo female admitted with hypertensive emergency, fall, nasal bone fx. Hx of chronic orthostatic hypotension, COPD, bipolar, osteoporosis. Pt is from ALF    PT Comments    Pt progressing well with mobility, used rollator today however if to use in future, would need more training for safety (using brakes).  Follow Up Recommendations  Home health PT (HHPT at ALF as pt refuses SNF)     Equipment Recommendations  Other (comment) (daughter and pt considering acquiring rollator)    Recommendations for Other Services       Precautions / Restrictions Precautions Precautions: Fall Restrictions Weight Bearing Restrictions: No    Mobility  Bed Mobility Overal bed mobility: Needs Assistance Bed Mobility: Sit to Supine       Sit to supine: Supervision   General bed mobility comments: attempted to crawl over bed rail, cue to allow PT to drop bed rail for easier access  Transfers Overall transfer level: Needs assistance Equipment used: Rolling walker (2 wheeled) Transfers: Sit to/from Stand Sit to Stand: Supervision         General transfer comment: close guard for safety.  Ambulation/Gait Ambulation/Gait assistance: Min guard Ambulation Distance (Feet): 260 Feet Assistive device: 4-wheeled walker (rollator) Gait Pattern/deviations: Step-through pattern     General Gait Details: close guard for safety. No LOB with use rollator.  Showed pt how to use brakes however pt did not practice. Pt reports slight dizziness. Tolerated activity well.    Stairs            Wheelchair Mobility    Modified Rankin (Stroke Patients Only)       Balance                                    Cognition Arousal/Alertness: Awake/alert Behavior During Therapy: WFL for tasks assessed/performed Overall Cognitive  Status: Within Functional Limits for tasks assessed                      Exercises      General Comments        Pertinent Vitals/Pain Pain Assessment: No/denies pain    Home Living                      Prior Function            PT Goals (current goals can now be found in the care plan section) Progress towards PT goals: Progressing toward goals    Frequency  Min 3X/week    PT Plan Current plan remains appropriate    Co-evaluation             End of Session   Activity Tolerance: Patient tolerated treatment well Patient left: in bed;with call bell/phone within reach;with bed alarm set     Time: 1404-1420 PT Time Calculation (min) (ACUTE ONLY): 16 min  Charges:  $Gait Training: 8-22 mins                    G Codes:      [name],KATHrine E April 19, 2014, 2:30 PM Carmelia Bake, PT, DPT Apr 19, 2014 Pager: [phone]

## 2014-04-02 NOTE — Discharge Summary (Signed)
Physician Discharge Summary  Taylor Roth [id number] DOB: [birth year] DOA: 03/29/2014  PCP: No primary care provider on file.  Admit date: 03/29/2014 Discharge date: 04/03/2014  Recommendations for Outpatient Follow-up:  1. Pt will need to follow up with PCP in 2-3 weeks post discharge 2. Please obtain BMP to evaluate electrolytes and kidney function 3. Please also check CBC to evaluate Hg and Hct levels 4. Please note that pt has completed ABX regimen for UTI while hospitalized   Discharge Diagnoses:  Active Problems:   Hypertensive emergency   Dizziness   Nasal bone fracture   UTI (lower urinary tract infection)  Discharge Condition: Stable  Diet recommendation: Heart healthy diet discussed in details   History of present illness:  81 y.o. female with history of chronic orthostatic hypotension on fludrocortisone, COPD, bipolar disorder, protein calorie malnutrition was brought to the ER after patient had a fall in the nursing home. Patient states that she has been feeling dizzy off and on and one day PTA she fell down on her face while walking.   In the ER, pt was hemodynamically stable but with SBP > 160's (responded well to Hydralazine 2 mg IV given in ED), CT head and maxillofacial done showed nondisplaced nasal bone fracture.  Hospital Course:  Dizziness with history of orthostatic hypotension - pt reports feeling better this AM, says she walks with assistance in the hallway  - she is at home on significant meds: Seroquel 150 mg QD, mirtazapine QHS 45 mg, triazolam QHS, clorazepate, divalproex - continued IVF, fludrocortisone for now - pt denies chest pain or shortness of breath this AM UTI - placed on Rocephin IV, today is day #4/5 Hypertensive urgency - given one dose of Hydralazine IV in ED and responded well - SBP is in 140's this AM - continue to monitor  Nasal bone fracture nondisplaced  - no indication for intervention per ENT recommendations - continue  conservative management - apply ice as needed  Bipolar disorder  - continue present medications. COPD - stable respiratory status  Severe PCM - nutritionist consulted Underweight  - Body mass index is 19  Code Status: Full.  Family Communication: plan of care discussed with the patient, called daughter over the phone  Disposition Plan: SNF in AM  Procedures/Studies: Dg Chest 2 View 03/30/2014 No acute disease.   Ct Head Wo Contrast 03/30/2014 No evidence of acute intracranial traumatic injury. Atrophy and chronic microvascular ischemic disease. Nondisplaced nasal bone fractures.   Ct Head Wo Contrast 03/07/2014 No acute intracranial injury or cervical spine fracture. 2. Multilevel spinal fusion with advanced anterolisthesis and facet arthritis at C4-5.   Ct Cervical Spine Wo Contrast 03/30/2014 Stable findings as above without acute osseous abnormality of the cervical spine.   Ct Cervical Spine Wo Contrast 03/07/2014 No acute intracranial injury or cervical spine fracture. Multilevel spinal fusion with advanced anterolisthesis and facet arthritis at C4-5.   Ct Maxillofacial Wo Cm 03/30/2014 No evidence of acute intracranial traumatic injury. Atrophy and chronic microvascular ischemic disease. Nondisplaced nasal bone fractures.   Consultations:  None  Antibiotics:  Rocephin 2/16 --> 2/20  Discharge Exam: Filed Vitals:   04/02/14 0609  BP: 163/83  Pulse: 69  Temp: 97.9 F (36.6 C)  Resp: 18   Filed Vitals:   04/01/14 0535 04/01/14 1339 04/01/14 2027 04/02/14 0609  BP: 153/87 142/83 167/79 163/83  Pulse: 73 68 66 69  Temp: 98 F (36.7 C) 97.4 F (36.3 C) 97.6 F (36.4 C) 97.9 F (36.6  C)  TempSrc: Oral Oral Oral Oral  Resp: 18 18 18 18   Height:      Weight:      SpO2: 98% 98% 100% 99%    General: Pt is alert, follows commands appropriately, not in acute distress Cardiovascular: Regular rate and rhythm, S1/S2 +, no murmurs, no rubs, no  gallops Respiratory: Clear to auscultation bilaterally, no wheezing, no crackles, no rhonchi Abdominal: Soft, non tender, non distended, bowel sounds +, no guarding Extremities: no edema, no cyanosis, pulses palpable bilaterally DP and PT Neuro: Grossly nonfocal  Discharge Instructions  Discharge Instructions    Diet - low sodium heart healthy    Complete by:  As directed      Increase activity slowly    Complete by:  As directed             Medication List    TAKE these medications        acetaminophen 325 MG tablet  Commonly known as:  TYLENOL  Take 650 mg by mouth every 6 (six) hours as needed for headache.     aspirin 81 MG chewable tablet  Chew 1 tablet (81 mg total) by mouth daily.     CEPACOL SORE THROAT PO  Take 1 tablet by mouth every 2 (two) hours as needed (sore throat).     clorazepate 7.5 MG tablet  Commonly known as:  TRANXENE  Take 7.5 mg by mouth 2 (two) times daily.     diclofenac 1.3 % Ptch  Commonly known as:  FLECTOR  Place 1 patch onto the skin every 12 (twelve) hours as needed (For localized pain.).     divalproex 250 MG DR tablet  Commonly known as:  DEPAKOTE  Take 250 mg by mouth daily.     docusate sodium 100 MG capsule  Commonly known as:  COLACE  Take 100 mg by mouth 2 (two) times daily.     dorzolamide-timolol 22.3-6.8 MG/ML ophthalmic solution  Commonly known as:  COSOPT  Place 1 drop into both eyes 2 (two) times daily.     fludrocortisone 0.1 MG tablet  Commonly known as:  FLORINEF  Take 0.1 mg by mouth every morning.     Ginkgo Biloba 120 MG Caps  Take 120 mg by mouth 2 (two) times daily.     guaiFENesin 100 MG/5ML liquid  Commonly known as:  ROBITUSSIN  Take 200 mg by mouth every 6 (six) hours as needed for cough.     HYDROcodone-acetaminophen 10-325 MG per tablet  Commonly known as:  NORCO  Take 1 tablet by mouth every 6 (six) hours as needed (pain).     hydrocortisone 2.5 % rectal cream  Commonly known as:  ANUSOL-HC   Place 1 application rectally 2 (two) times daily.     levalbuterol 45 MCG/ACT inhaler  Commonly known as:  XOPENEX HFA  Inhale 2 puffs into the lungs every 4 (four) hours as needed for wheezing or shortness of breath.     levocetirizine 5 MG tablet  Commonly known as:  XYZAL  Take 5 mg by mouth daily as needed for allergies.     meclizine 25 MG tablet  Commonly known as:  ANTIVERT  Take 25 mg by mouth 3 (three) times daily as needed for dizziness. Do not combine with Hydroxyzine or any other antihistamine.     Melatonin 1 MG Tabs  Take 1 mg by mouth at bedtime.     menthol-cetylpyridinium 3 MG lozenge  Commonly known as:  CEPACOL  Take 1 lozenge by mouth every 2 (two) hours as needed for sore throat.     mirtazapine 45 MG tablet  Commonly known as:  REMERON  Take 45 mg by mouth at bedtime.     nitroGLYCERIN 0.4 MG SL tablet  Commonly known as:  NITROSTAT  Place 0.4 mg under the tongue every 5 (five) minutes as needed for chest pain. May use 3 times     ondansetron 4 MG tablet  Commonly known as:  ZOFRAN  Take 4 mg by mouth every 4 (four) hours as needed for nausea.     polyethylene glycol packet  Commonly known as:  MIRALAX / GLYCOLAX  Take 17 g by mouth daily.     PRESCRIPTION MEDICATION  Take 1 Container by mouth 2 (two) times daily between meals.     QUEtiapine Fumarate 150 MG 24 hr tablet  Commonly known as:  SEROQUEL XR  Take 150 mg by mouth daily at 8 pm.     ranitidine 150 MG tablet  Commonly known as:  ZANTAC  Take 150 mg by mouth 2 (two) times daily.     SENNA LAX PO  Take 2 tablets by mouth 2 (two) times daily. Take 2 tablets in the morning and take 2 tablets at bedtime.     sodium chloride 0.65 % Soln nasal spray  Commonly known as:  OCEAN  Place 2 sprays into both nostrils every 4 (four) hours as needed (For nasal congestion.).     SYSTANE OP  Apply 1 drop to eye every 12 (twelve) hours as needed (for dry eyes).     travoprost (benzalkonium)  0.004 % ophthalmic solution  Commonly known as:  TRAVATAN  Place 1 drop into both eyes at bedtime.     triazolam 0.25 MG tablet  Commonly known as:  HALCION  Take 0.5 mg by mouth at bedtime.     Vitamin D 1000 UNITS capsule  Take 1,000 Units by mouth daily.           Follow-up Information    Follow up with Faye Ramsay, MD.   Specialty:  Internal Medicine   Why:  As needed   Contact information:   [address] [place] [address] [phone]        The results of significant diagnostics from this hospitalization (including imaging, microbiology, ancillary and laboratory) are listed below for reference.     Microbiology: Recent Results (from the past 240 hour(s))  MRSA PCR Screening     Status: None   Collection Time: 03/30/14  5:25 AM  Result Value Ref Range Status   MRSA by PCR NEGATIVE NEGATIVE Final    Comment:        The GeneXpert MRSA Assay (FDA approved for NASAL specimens only), is one component of a comprehensive MRSA colonization surveillance program. It is not intended to diagnose MRSA infection nor to guide or monitor treatment for MRSA infections.   Culture, Urine     Status: None   Collection Time: 03/30/14  2:51 PM  Result Value Ref Range Status   Specimen Description URINE, CLEAN CATCH  Final   Special Requests NONE  Final   Colony Count NO GROWTH Performed at [facility]   Final   Culture NO GROWTH Performed at [facility]   Final   Report Status 04/01/2014 FINAL  Final     Labs: Basic Metabolic Panel:  Recent Labs Lab 03/30/14 0029 03/30/14 0600 03/31/14 0544  04/01/14 0520 04/02/14 0354  NA 143 144 144 142 143  K 3.6 3.6 3.6 3.9 4.2  CL 105 108 108 105 108  CO2  --  27 27 29 29   GLUCOSE 97 109* 103* 100* 98  BUN 20 15 15 14 18   CREATININE 0.80 0.63 0.70 0.67 0.68  CALCIUM  --  9.3 8.8 8.7 8.9   Liver Function Tests:  Recent Labs Lab 03/30/14 0600  AST 25  ALT 14   ALKPHOS 88  BILITOT 0.3  PROT 7.1  ALBUMIN 4.1   CBC:  Recent Labs Lab 03/30/14 0019 03/30/14 0029 03/30/14 0600 03/31/14 0544 04/01/14 0520 04/02/14 0354  WBC 4.8  --  7.4 5.3 5.3 5.4  NEUTROABS 2.2  --  5.3  --   --   --   HGB 12.0 13.3 13.3 12.4 11.9* 11.7*  HCT 38.0 39.0 41.4 39.3 37.8 36.6  MCV 91.8  --  89.6 91.0 90.4 89.9  PLT 183  --  186 180 154 145*   Cardiac Enzymes:  Recent Labs Lab 03/30/14 0600  TROPONINI 0.03    SIGNED: Time coordinating discharge: Over 30 minutes  Faye Ramsay, MD  [name] 04/02/2014, 11:33 AM Pager [phone]  If 7PM-7AM, please contact night-coverage [email or web address] Password [email or web address]

## 2014-04-02 NOTE — Discharge Instructions (Signed)

## 2014-04-03 NOTE — Progress Notes (Signed)
Pt seen and examined at bedside, denies concerns, no chest pain or shortness of breath. Stable for d/c today. Please see d/c summary from 04/02/2014.  Faye Ramsay, MD  [name] Pager [phone]  If 7PM-7AM, please contact night-coverage [email or web address] Password [email or web address]

## 2014-04-03 NOTE — Clinical Social Work Note (Signed)
CSW received a call from RN stating that pt was ready for discharge  CSW contacted [facility] (pt's [facility])  to advise of pt discharge back to facility  CSW faxed discharge summary, prepared discharge packet and provided to RN  CSW called and spoke with pt's daughter [name] who stated that she would transport pt back to facility  No further CSW needs  CSW signing off  .Dede Query, LCSW [facility] Clinical Social Worker - Weekend Coverage cell #: [phone]

## 2014-04-03 NOTE — Progress Notes (Signed)
Discharge to [facility], daughter transporting patient. Report given to [facility] PIV removed no s/s of infiltration or swelling noted.

## 2014-05-15 ENCOUNTER — Emergency Department (HOSPITAL_COMMUNITY)
Admission: EM | Admit: 2014-05-15 | Discharge: 2014-05-15 | Disposition: A | Discharge status: Home / Self Care | Payer: Medicare Other | Attending: Emergency Medicine | Admitting: Emergency Medicine

## 2014-05-15 ENCOUNTER — Encounter (HOSPITAL_COMMUNITY): Payer: Self-pay | Admitting: Emergency Medicine

## 2014-05-15 DIAGNOSIS — N184 Chronic kidney disease, stage 4 (severe): Secondary | ICD-10-CM | POA: Diagnosis not present

## 2014-05-15 DIAGNOSIS — F419 Anxiety disorder, unspecified: Secondary | ICD-10-CM | POA: Insufficient documentation

## 2014-05-15 DIAGNOSIS — Z91040 Latex allergy status: Secondary | ICD-10-CM | POA: Insufficient documentation

## 2014-05-15 DIAGNOSIS — M810 Age-related osteoporosis without current pathological fracture: Secondary | ICD-10-CM | POA: Diagnosis not present

## 2014-05-15 DIAGNOSIS — G43909 Migraine, unspecified, not intractable, without status migrainosus: Secondary | ICD-10-CM | POA: Diagnosis not present

## 2014-05-15 DIAGNOSIS — F329 Major depressive disorder, single episode, unspecified: Secondary | ICD-10-CM | POA: Diagnosis not present

## 2014-05-15 DIAGNOSIS — Z791 Long term (current) use of non-steroidal anti-inflammatories (NSAID): Secondary | ICD-10-CM | POA: Diagnosis not present

## 2014-05-15 DIAGNOSIS — J449 Chronic obstructive pulmonary disease, unspecified: Secondary | ICD-10-CM | POA: Insufficient documentation

## 2014-05-15 DIAGNOSIS — Z7982 Long term (current) use of aspirin: Secondary | ICD-10-CM | POA: Diagnosis not present

## 2014-05-15 DIAGNOSIS — N390 Urinary tract infection, site not specified: Secondary | ICD-10-CM | POA: Diagnosis not present

## 2014-05-15 DIAGNOSIS — Z79899 Other long term (current) drug therapy: Secondary | ICD-10-CM | POA: Insufficient documentation

## 2014-05-15 DIAGNOSIS — K219 Gastro-esophageal reflux disease without esophagitis: Secondary | ICD-10-CM | POA: Diagnosis not present

## 2014-05-15 DIAGNOSIS — Z7952 Long term (current) use of systemic steroids: Secondary | ICD-10-CM | POA: Insufficient documentation

## 2014-05-15 DIAGNOSIS — Z87891 Personal history of nicotine dependence: Secondary | ICD-10-CM | POA: Insufficient documentation

## 2014-05-15 DIAGNOSIS — Z8639 Personal history of other endocrine, nutritional and metabolic disease: Secondary | ICD-10-CM | POA: Diagnosis not present

## 2014-05-15 DIAGNOSIS — G4700 Insomnia, unspecified: Secondary | ICD-10-CM

## 2014-05-15 LAB — BASIC METABOLIC PANEL
Anion gap: 9 (ref 5–15)
BUN: 24 mg/dL — ABNORMAL HIGH (ref 6–23)
CALCIUM: 8.9 mg/dL (ref 8.4–10.5)
CO2: 29 mmol/L (ref 19–32)
Chloride: 103 mmol/L (ref 96–112)
Creatinine, Ser: 1 mg/dL (ref 0.50–1.10)
GFR calc Af Amer: 59 mL/min — ABNORMAL LOW (ref >90)
GFR calc non Af Amer: 51 mL/min — ABNORMAL LOW (ref >90)
Glucose, Bld: 116 mg/dL — ABNORMAL HIGH (ref 70–99)
POTASSIUM: 4.1 mmol/L (ref 3.5–5.1)
Sodium: 141 mmol/L (ref 135–145)

## 2014-05-15 LAB — CBC WITH DIFFERENTIAL/PLATELET
Basophils Absolute: 0 10*3/uL (ref 0.0–0.1)
Basophils Relative: 0 % (ref 0–1)
EOS PCT: 3 % (ref 0–5)
Eosinophils Absolute: 0.2 10*3/uL (ref 0.0–0.7)
HCT: 40.3 % (ref 36.0–46.0)
Hemoglobin: 12.6 g/dL (ref 12.0–15.0)
LYMPHS ABS: 2.2 10*3/uL (ref 0.7–4.0)
LYMPHS PCT: 36 % (ref 12–46)
MCH: 29 pg (ref 26.0–34.0)
MCHC: 31.3 g/dL (ref 30.0–36.0)
MCV: 92.9 fL (ref 78.0–100.0)
MONO ABS: 0.5 10*3/uL (ref 0.1–1.0)
Monocytes Relative: 8 % (ref 3–12)
NEUTROS PCT: 53 % (ref 43–77)
Neutro Abs: 3.1 10*3/uL (ref 1.7–7.7)
Platelets: 227 10*3/uL (ref 150–400)
RBC: 4.34 MIL/uL (ref 3.87–5.11)
RDW: 13.7 % (ref 11.5–15.5)
WBC: 6 10*3/uL (ref 4.0–10.5)

## 2014-05-15 LAB — URINE MICROSCOPIC-ADD ON

## 2014-05-15 LAB — URINALYSIS, ROUTINE W REFLEX MICROSCOPIC
Bilirubin Urine: NEGATIVE
Glucose, UA: NEGATIVE mg/dL
Hgb urine dipstick: NEGATIVE
Ketones, ur: NEGATIVE mg/dL
Nitrite: NEGATIVE
Protein, ur: NEGATIVE mg/dL
SPECIFIC GRAVITY, URINE: 1.017 (ref 1.005–1.030)
UROBILINOGEN UA: 0.2 mg/dL (ref 0.0–1.0)
pH: 6.5 (ref 5.0–8.0)

## 2014-05-15 MED ORDER — TRIAZOLAM 0.25 MG PO TABS: 0.2500 mg | ORAL_TABLET | Freq: Every day | ORAL | Status: DC

## 2014-05-15 MED ORDER — DEXTROSE 5 % IV SOLN
1.0000 g | Freq: Once | INTRAVENOUS | Status: AC
  Administered 2014-05-15: 1 g via INTRAVENOUS
  Filled 2014-05-15: qty 10

## 2014-05-15 NOTE — ED Provider Notes (Signed)
CSN: [id number]     Arrival date & time 05/15/14  1450 History   First MD Initiated Contact with Patient 05/15/14 1503     Chief Complaint  Patient presents with  . Urinary Tract Infection      HPI Patient comes from nursing home with burning with urination urinary frequency.  No fever chills.  No flank pain.  No nausea vomiting.  Patient also complained of insomnia and is out of her sleeping medicine.  Asked for refill. Past Medical History  Diagnosis Date  . Osteoporosis   . Depression   . Anxiety   . COPD (chronic obstructive pulmonary disease)   . GERD (gastroesophageal reflux disease)   . Abuse     benzos/narcotics  . Migraines   . Scoliosis   . Insomnia, unspecified   . Vitamin B deficiency   . Weight loss   . Chronic mental illness   . Arterial tortuosity (aorta) 12/11/2012  . Chronic kidney disease, stage IV (severe) 12/11/2012  . Substance abuse     Benzos and Narcotics, she does not get any of those medicines from [facility], we have empahtically told her that.   . Vertigo   . Colitis    Past Surgical History  Procedure Laterality Date  . Cholecystectomy    . Partial hysterectomy    . Abdominal hysterectomy    . Cataract extraction     Family History  Problem Relation Age of Onset  . Stroke Mother   . Diabetes Mother   . Cancer Father 15    esophageal  . Heart disease Mother   . Hearing loss Mother    History  Substance Use Topics  . Smoking status: Former Smoker -- 0.50 packs/day    Types: Cigarettes  . Smokeless tobacco: Never Used  . Alcohol Use: No   OB History    No data available     Review of Systems  All other systems reviewed and are negative  Allergies  Macrobid; Azithromycin; Doxycycline; Escitalopram oxalate; Fioricet; Latex; Levofloxacin; Nsaids; Oxycodone; Restasis; Sulfa antibiotics; Tylenol; and Biaxin  Home Medications   Prior to Admission medications   Medication Sig Start Date End Date Taking? Authorizing Provider   acetaminophen (TYLENOL) 325 MG tablet Take 650 mg by mouth every 6 (six) hours as needed for headache.   Yes Historical Provider, MD  aspirin 81 MG chewable tablet Chew 1 tablet (81 mg total) by mouth daily. 12/12/12  Yes [name], MD  Cholecalciferol (VITAMIN D) 1000 UNITS capsule Take 1,000 Units by mouth daily.   Yes Historical Provider, MD  clorazepate (TRANXENE) 7.5 MG tablet Take 7.5 mg by mouth 2 (two) times daily.  12/23/12  Yes Estill Dooms, MD  Cranberry 425 MG CAPS Take 1 capsule by mouth 2 (two) times daily.   Yes Historical Provider, MD  diclofenac (FLECTOR) 1.3 % PTCH Place 1 patch onto the skin every 12 (twelve) hours as needed (For localized pain.).    Yes Historical Provider, MD  divalproex (DEPAKOTE) 250 MG DR tablet Take 250 mg by mouth daily.   Yes Historical Provider, MD  docusate sodium (COLACE) 100 MG capsule Take 100 mg by mouth 2 (two) times daily.   Yes Historical Provider, MD  dorzolamide-timolol (COSOPT) 22.3-6.8 MG/ML ophthalmic solution Place 1 drop into both eyes 2 (two) times daily.   Yes Historical Provider, MD  fludrocortisone (FLORINEF) 0.1 MG tablet Take 0.1 mg by mouth every morning.   Yes Historical Provider, MD  Ginkgo Biloba  120 MG CAPS Take 120 mg by mouth 2 (two) times daily.    Yes Historical Provider, MD  guaiFENesin (ROBITUSSIN) 100 MG/5ML liquid Take 200 mg by mouth every 6 (six) hours as needed for cough.   Yes Historical Provider, MD  HYDROcodone-acetaminophen (NORCO) 10-325 MG per tablet Take 1 tablet by mouth every 6 (six) hours as needed (pain). Patient taking differently: Take 1 tablet by mouth 3 (three) times daily. Scheduled at 6am, 12 noon, and 6pm. And may take 1 tablet at 12 midnight as needed for pain 04/02/14  Yes Theodis Blaze, MD  hydrocortisone (ANUSOL-HC) 2.5 % rectal cream Place 1 application rectally 2 (two) times daily as needed for hemorrhoids or itching.   Yes Historical Provider, MD  levocetirizine (XYZAL) 5 MG tablet Take  5 mg by mouth daily as needed for allergies.   Yes Historical Provider, MD  LORazepam (ATIVAN) 0.5 MG tablet Take 0.25 mg by mouth at bedtime.   Yes Historical Provider, MD  meclizine (ANTIVERT) 25 MG tablet Take 25 mg by mouth 3 (three) times daily as needed for dizziness. Do not combine with Hydroxyzine or any other antihistamine. 11/12/13  Yes Thao P Le, DO  Melatonin 1 MG TABS Take 1 mg by mouth at bedtime.    Yes Historical Provider, MD  menthol-cetylpyridinium (CEPACOL) 3 MG lozenge Take 1 lozenge by mouth every 2 (two) hours as needed for sore throat.   Yes Historical Provider, MD  mirtazapine (REMERON) 45 MG tablet Take 45 mg by mouth at bedtime.   Yes Historical Provider, MD  nitroGLYCERIN (NITROSTAT) 0.4 MG SL tablet Place 0.4 mg under the tongue every 5 (five) minutes as needed for chest pain. May use 3 times   Yes Historical Provider, MD  ondansetron (ZOFRAN) 4 MG tablet Take 4 mg by mouth every 4 (four) hours as needed for nausea. 12/12/12  Yes [name], MD  OVER THE COUNTER MEDICATION Apply 1 scoop topically once a week. Vinegar Wash: provide and mix 1 tabletspoonful white distilled vinegar to 2 cups of warm water to [employer] weekly to prevent UTI   Yes Historical Provider, MD  Polyethyl Glycol-Propyl Glycol (SYSTANE OP) Apply 1 drop to eye every 12 (twelve) hours as needed (for dry eyes).    Yes Historical Provider, MD  polyethylene glycol (MIRALAX / GLYCOLAX) packet Take 17 g by mouth daily.   Yes Historical Provider, MD  PRESCRIPTION MEDICATION Take 1 Container by mouth 2 (two) times daily between meals. Med Pass   Yes Historical Provider, MD  Pseudoephedrine-Acetaminophen (CEPACOL SORE THROAT PO) Take 1 tablet by mouth every 2 (two) hours as needed (sore throat).   Yes Historical Provider, MD  QUEtiapine Fumarate (SEROQUEL XR) 150 MG 24 hr tablet Take 150 mg by mouth daily at 8 pm.   Yes Historical Provider, MD  ranitidine (ZANTAC) 150 MG tablet Take 150 mg by mouth 2 (two)  times daily.   Yes Historical Provider, MD  Sennosides (SENNA LAX PO) Take 2 tablets by mouth 2 (two) times daily. Take 2 tablets in the morning and take 2 tablets at bedtime.   Yes Historical Provider, MD  sodium chloride (OCEAN) 0.65 % SOLN nasal spray Place 2 sprays into both nostrils every 4 (four) hours as needed (For nasal congestion.).   Yes Historical Provider, MD  travoprost, benzalkonium, (TRAVATAN) 0.004 % ophthalmic solution Place 1 drop into both eyes at bedtime.   Yes Historical Provider, MD  triazolam (HALCION) 0.25 MG tablet Take 1  tablet (0.25 mg total) by mouth at bedtime. 05/15/14   Leonard Schwartz, MD   BP 148/92 mmHg  Pulse 76  Temp(Src) 98.2 F (36.8 C) (Oral)  Resp 16  SpO2 99% Physical Exam Physical Exam  Nursing note and vitals reviewed. Constitutional: She is oriented to person, place, and time. She appears well-developed and well-nourished. No distress.  HENT:  Head: Normocephalic and atraumatic.  Eyes: Pupils are equal, round, and reactive to light.  Neck: Normal range of motion.  Cardiovascular: Normal rate and intact distal pulses.   Pulmonary/Chest: No respiratory distress.  Abdominal: Normal appearance. She exhibits no distension.  Musculoskeletal: Normal range of motion.  Neurological: She is alert and oriented to person, place, and time. No cranial nerve deficit.  Skin: Skin is warm and dry. No rash noted.  Psychiatric: She has a normal mood and affect. Her behavior is normal.   ED Course  Procedures (including critical care time) Medications  cefTRIAXone (ROCEPHIN) 1 g in dextrose 5 % 50 mL IVPB (not administered)    Labs Review Labs Reviewed  BASIC METABOLIC PANEL - Abnormal; Notable for the following:    Glucose, Bld 116 (*)    BUN 24 (*)    GFR calc non Af Amer 51 (*)    GFR calc Af Amer 59 (*)    All other components within normal limits  URINALYSIS, ROUTINE W REFLEX MICROSCOPIC - Abnormal; Notable for the following:    APPearance CLOUDY  (*)    Leukocytes, UA MODERATE (*)    All other components within normal limits  URINE MICROSCOPIC-ADD ON - Abnormal; Notable for the following:    Bacteria, UA MANY (*)    All other components within normal limits  URINE CULTURE  CBC WITH DIFFERENTIAL/PLATELET       MDM   Final diagnoses:  UTI (lower urinary tract infection)  Insomnia        Leonard Schwartz, MD 05/15/14 [id number]

## 2014-05-15 NOTE — ED Notes (Signed)
Daughter notified of patient's discharge, will arrive approximately 17:30 to transport patient.

## 2014-05-15 NOTE — Discharge Instructions (Signed)
Urinary Tract Infection A urinary tract infection (UTI) can occur any place along the urinary tract. The tract includes the kidneys, ureters, bladder, and urethra. A type of germ called bacteria often causes a UTI. UTIs are often helped with antibiotic medicine.  HOME CARE   If given, take antibiotics as told by your doctor. Finish them even if you start to feel better.  Drink enough fluids to keep your pee (urine) clear or pale yellow.  Avoid tea, drinks with caffeine, and bubbly (carbonated) drinks.  Pee often. Avoid holding your pee in for a long time.  Pee before and after having sex (intercourse).  Wipe from front to back after you poop (bowel movement) if you are a woman. Use each tissue only once. GET HELP RIGHT AWAY IF:   You have back pain.  You have lower belly (abdominal) pain.  You have chills.  You feel sick to your stomach (nauseous).  You throw up (vomit).  Your burning or discomfort with peeing does not go away.  You have a fever.  Your symptoms are not better in 3 days. MAKE SURE YOU:   Understand these instructions.  Will watch your condition.  Will get help right away if you are not doing well or get worse. Document Released: 07/18/2007 Document Revised: 10/24/2011 Document Reviewed: 08/30/2011 [facility] Patient Information 2015 [place], [place]. This information is not intended to replace advice given to you by your health care provider. Make sure you discuss any questions you have with your health care provider.  Insomnia Insomnia is frequent trouble falling and/or staying asleep. Insomnia can be a long term problem or a short term problem. Both are common. Insomnia can be a short term problem when the wakefulness is related to a certain stress or worry. Long term insomnia is often related to ongoing stress during waking hours and/or poor sleeping habits. Overtime, sleep deprivation itself can make the problem worse. Every little thing feels more severe  because you are overtired and your ability to cope is decreased. CAUSES   Stress, anxiety, and depression.  Poor sleeping habits.  Distractions such as TV in the bedroom.  Naps close to bedtime.  Engaging in emotionally charged conversations before bed.  Technical reading before sleep.  Alcohol and other sedatives. They may make the problem worse. They can hurt normal sleep patterns and normal dream activity.  Stimulants such as caffeine for several hours prior to bedtime.  Pain syndromes and shortness of breath can cause insomnia.  Exercise late at night.  Changing time zones may cause sleeping problems (jet lag). It is sometimes helpful to have someone observe your sleeping patterns. They should look for periods of not breathing during the night (sleep apnea). They should also look to see how long those periods last. If you live alone or observers are uncertain, you can also be observed at a sleep clinic where your sleep patterns will be professionally monitored. Sleep apnea requires a checkup and treatment. Give your caregivers your medical history. Give your caregivers observations your family has made about your sleep.  SYMPTOMS   Not feeling rested in the morning.  Anxiety and restlessness at bedtime.  Difficulty falling and staying asleep. TREATMENT   Your caregiver may prescribe treatment for an underlying medical disorders. Your caregiver can give advice or help if you are using alcohol or other drugs for self-medication. Treatment of underlying problems will usually eliminate insomnia problems.  Medications can be prescribed for short time use. They are generally not recommended  for lengthy use.  Over-the-counter sleep medicines are not recommended for lengthy use. They can be habit forming.  You can promote easier sleeping by making lifestyle changes such as:  Using relaxation techniques that help with breathing and reduce muscle tension.  Exercising earlier in  the day.  Changing your diet and the time of your last meal. No night time snacks.  Establish a regular time to go to bed.  Counseling can help with stressful problems and worry.  Soothing music and white noise may be helpful if there are background noises you cannot remove.  Stop tedious detailed work at least one hour before bedtime. HOME CARE INSTRUCTIONS   Keep a diary. Inform your caregiver about your progress. This includes any medication side effects. See your caregiver regularly. Take note of:  Times when you are asleep.  Times when you are awake during the night.  The quality of your sleep.  How you feel the next day. This information will help your caregiver care for you.  Get out of bed if you are still awake after 15 minutes. Read or do some quiet activity. Keep the lights down. Wait until you feel sleepy and go back to bed.  Keep regular sleeping and waking hours. Avoid naps.  Exercise regularly.  Avoid distractions at bedtime. Distractions include watching television or engaging in any intense or detailed activity like attempting to balance the household checkbook.  Develop a bedtime ritual. Keep a familiar routine of bathing, brushing your teeth, climbing into bed at the same time each night, listening to soothing music. Routines increase the success of falling to sleep faster.  Use relaxation techniques. This can be using breathing and muscle tension release routines. It can also include visualizing peaceful scenes. You can also help control troubling or intruding thoughts by keeping your mind occupied with boring or repetitive thoughts like the old concept of counting sheep. You can make it more creative like imagining planting one beautiful flower after another in your backyard garden.  During your day, work to eliminate stress. When this is not possible use some of the previous suggestions to help reduce the anxiety that accompanies stressful situations. MAKE  SURE YOU:   Understand these instructions.  Will watch your condition.  Will get help right away if you are not doing well or get worse. Document Released: 01/27/2000 Document Revised: 04/23/2011 Document Reviewed: 02/26/2007 [facility] Patient Information 2015 [place], [place]. This information is not intended to replace advice given to you by your health care provider. Make sure you discuss any questions you have with your health care provider.

## 2014-05-15 NOTE — ED Notes (Signed)
PER PTAR- Pt picked up from [facility] nursing facility c/o burning while urinating.

## 2014-05-15 NOTE — ED Notes (Signed)
Bed: WA07 Expected date:  Expected time:  Means of arrival:  Comments: UTI

## 2014-05-21 LAB — URINE CULTURE
Colony Count: >100000
Special Requests: NORMAL

## 2014-05-22 NOTE — Progress Notes (Signed)
ED Antimicrobial Stewardship Positive Culture Follow Up   Taylor Roth is an 81 y.o. female who presented to [facility] on 05/15/2014 with a chief complaint of  Chief Complaint  Patient presents with  . Urinary Tract Infection    Recent Results (from the past 720 hour(s))  Urine culture     Status: None   Collection Time: 05/15/14  3:01 PM  Result Value Ref Range Status   Specimen Description URINE, CATHETERIZED  Final   Special Requests Normal  Final   Colony Count >100000 COL/ML  Final   Culture   Final    VIRIDANS STREPTOCOCCUS SUSCEPTIBILITY NOT NORMALLY PERFORMED ON THIS ORGANISM Performed at [name]   Report Status 05/21/2014 FINAL  Final    [x]  Patient discharged originally without antimicrobial agent and treatment is now indicated  New antibiotic prescription: Keflex 500 mg po bid x 7 days  ED Provider: Waynetta Pean, PA-C   Lawson Radar 05/22/2014, 5:54 PM Infectious Diseases Pharmacist Phone# [phone]

## 2014-05-23 ENCOUNTER — Telehealth: Payer: Self-pay | Admitting: Emergency Medicine

## 2014-05-23 NOTE — Telephone Encounter (Signed)
Post ED Visit - Positive Culture Follow-up: Successful Patient Follow-Up  Culture assessed and recommendations reviewed by: []  Wes [place], Pharm.D., BCPS []  Heide Guile, Pharm.D., BCPS [x]  Alycia Rossetti, Pharm.D., BCPS []  [place], Pharm.D., BCPS, AAHIVP []  Legrand Como, Pharm.D., BCPS, AAHIVP []  Hassie Bruce, Pharm.D. []  Milus Glazier, Pharm.D.  Positive Urine culture  [x]  Patient discharged without antimicrobial prescription and treatment is now indicated []  Organism is resistant to prescribed ED discharge antimicrobial []  Patient with positive blood cultures  Changes discussed with ED provider: Will Dansie PA New antibiotic prescription Keflex 500 mg PO BID x seven days Result with med order faxed to [place]  pt resident at [facility]. Called facility and notified patient's nurse Vicky. Result with antibiotic order faxed to [facility] [phone].   Ernesta Amble 05/23/2014, 9:16 AM

## 2014-05-25 ENCOUNTER — Telehealth: Payer: Self-pay

## 2014-05-25 NOTE — Telephone Encounter (Signed)
Pt recently in ED. She was dx with UTI, keflex called in. Pt is concerned about her herpes gene. I asked her if she had any outbreaks or sores that have popped up--pt denies this. I told her that all it looked like she had in ED was UTI,  But pt continues to worry about the herpes. Please advise.

## 2014-05-25 NOTE — Telephone Encounter (Signed)
Spoke with pt---she was recently in ED

## 2014-05-25 NOTE — Telephone Encounter (Signed)
PATIENT WOULD LIKE DR. DAUB TO CALL HER BACK TODAY. SHE WOULD NOT SAY WHAT IT WAS REGARDING. BEST PHONE [phone]  [place]

## 2014-05-25 NOTE — Telephone Encounter (Signed)
Call patient and tell her we would be happy to see her and check her genital area to be sure she is not having a genital herpes recurrence.

## 2014-05-26 NOTE — Telephone Encounter (Signed)
Spoke with pt's daughter, she thinks she has a yeast infection. I called pt, LMOM to CB.

## 2014-07-04 ENCOUNTER — Encounter (HOSPITAL_COMMUNITY): Payer: Self-pay | Admitting: Emergency Medicine

## 2014-07-04 ENCOUNTER — Emergency Department (HOSPITAL_COMMUNITY)
Admission: EM | Admit: 2014-07-04 | Discharge: 2014-07-04 | Discharge status: Against Medical Advice | Payer: Medicare Other | Attending: Emergency Medicine | Admitting: Emergency Medicine

## 2014-07-04 DIAGNOSIS — N184 Chronic kidney disease, stage 4 (severe): Secondary | ICD-10-CM | POA: Diagnosis not present

## 2014-07-04 DIAGNOSIS — M419 Scoliosis, unspecified: Secondary | ICD-10-CM | POA: Insufficient documentation

## 2014-07-04 DIAGNOSIS — R093 Abnormal sputum: Secondary | ICD-10-CM | POA: Diagnosis present

## 2014-07-04 DIAGNOSIS — J449 Chronic obstructive pulmonary disease, unspecified: Secondary | ICD-10-CM | POA: Diagnosis not present

## 2014-07-04 NOTE — ED Notes (Signed)
Pt's daughter arrived to pick pt up.  Explained to her that pt refused to be seen when there was a room ready.

## 2014-07-04 NOTE — ED Notes (Addendum)
Asked pt if she still does not want to be seen by a doctor because there is a room ready for her.  Pt declined and states that her daughter is on her way to pick her up.  Pt states " I didn't wanna come to begin with but they kept forcing me to."

## 2014-07-04 NOTE — ED Notes (Signed)
Pt called her daughter to pick her up.  She is now refusing to be seen

## 2014-07-04 NOTE — ED Notes (Signed)
Per EMS-states she has been spitting up phlegm since lunch

## 2014-09-09 ENCOUNTER — Emergency Department (HOSPITAL_COMMUNITY): Payer: Medicare Other

## 2014-09-09 ENCOUNTER — Encounter (HOSPITAL_COMMUNITY): Payer: Self-pay | Admitting: Emergency Medicine

## 2014-09-09 ENCOUNTER — Emergency Department (HOSPITAL_COMMUNITY)
Admission: EM | Admit: 2014-09-09 | Discharge: 2014-09-09 | Disposition: A | Discharge status: Home / Self Care | Payer: Medicare Other | Attending: Emergency Medicine | Admitting: Emergency Medicine

## 2014-09-09 DIAGNOSIS — Y9389 Activity, other specified: Secondary | ICD-10-CM | POA: Insufficient documentation

## 2014-09-09 DIAGNOSIS — S29092A Other injury of muscle and tendon of back wall of thorax, initial encounter: Secondary | ICD-10-CM | POA: Insufficient documentation

## 2014-09-09 DIAGNOSIS — W1839XA Other fall on same level, initial encounter: Secondary | ICD-10-CM | POA: Diagnosis not present

## 2014-09-09 DIAGNOSIS — G4700 Insomnia, unspecified: Secondary | ICD-10-CM | POA: Diagnosis not present

## 2014-09-09 DIAGNOSIS — Z91040 Latex allergy status: Secondary | ICD-10-CM | POA: Insufficient documentation

## 2014-09-09 DIAGNOSIS — S199XXA Unspecified injury of neck, initial encounter: Secondary | ICD-10-CM | POA: Insufficient documentation

## 2014-09-09 DIAGNOSIS — R52 Pain, unspecified: Secondary | ICD-10-CM

## 2014-09-09 DIAGNOSIS — K219 Gastro-esophageal reflux disease without esophagitis: Secondary | ICD-10-CM | POA: Insufficient documentation

## 2014-09-09 DIAGNOSIS — F329 Major depressive disorder, single episode, unspecified: Secondary | ICD-10-CM | POA: Diagnosis not present

## 2014-09-09 DIAGNOSIS — S0990XA Unspecified injury of head, initial encounter: Secondary | ICD-10-CM | POA: Insufficient documentation

## 2014-09-09 DIAGNOSIS — M419 Scoliosis, unspecified: Secondary | ICD-10-CM | POA: Diagnosis not present

## 2014-09-09 DIAGNOSIS — F99 Mental disorder, not otherwise specified: Secondary | ICD-10-CM | POA: Diagnosis not present

## 2014-09-09 DIAGNOSIS — F419 Anxiety disorder, unspecified: Secondary | ICD-10-CM | POA: Insufficient documentation

## 2014-09-09 DIAGNOSIS — W19XXXA Unspecified fall, initial encounter: Secondary | ICD-10-CM

## 2014-09-09 DIAGNOSIS — S4991XA Unspecified injury of right shoulder and upper arm, initial encounter: Secondary | ICD-10-CM | POA: Diagnosis not present

## 2014-09-09 DIAGNOSIS — M810 Age-related osteoporosis without current pathological fracture: Secondary | ICD-10-CM | POA: Insufficient documentation

## 2014-09-09 DIAGNOSIS — J449 Chronic obstructive pulmonary disease, unspecified: Secondary | ICD-10-CM | POA: Insufficient documentation

## 2014-09-09 DIAGNOSIS — N184 Chronic kidney disease, stage 4 (severe): Secondary | ICD-10-CM | POA: Insufficient documentation

## 2014-09-09 DIAGNOSIS — Y998 Other external cause status: Secondary | ICD-10-CM | POA: Insufficient documentation

## 2014-09-09 DIAGNOSIS — Z87891 Personal history of nicotine dependence: Secondary | ICD-10-CM | POA: Insufficient documentation

## 2014-09-09 DIAGNOSIS — Z79899 Other long term (current) drug therapy: Secondary | ICD-10-CM | POA: Diagnosis not present

## 2014-09-09 DIAGNOSIS — Y9289 Other specified places as the place of occurrence of the external cause: Secondary | ICD-10-CM | POA: Insufficient documentation

## 2014-09-09 DIAGNOSIS — Z7982 Long term (current) use of aspirin: Secondary | ICD-10-CM | POA: Diagnosis not present

## 2014-09-09 NOTE — ED Notes (Signed)
Pt complaint of generalized back pain and neck pain post fall in unmarked wet cement. Pt has "ccollar" made of towels placed by EMS.

## 2014-09-09 NOTE — ED Notes (Signed)
All cement removed via wet water on rag; pt denies burning, irritation, or pain a wet cement sites with removal. No burn, redness, or irration noted to affected areas.

## 2014-09-09 NOTE — ED Notes (Signed)
Bed: [facility] Expected date:  Expected time:  Means of arrival:  Comments: fall

## 2014-09-09 NOTE — ED Notes (Signed)
Pt placed in paper scrubs and belongings/clothes with wet cement placed in pt belongings bag and given to daughter.

## 2014-09-09 NOTE — ED Notes (Signed)
Pt fell into wet cement at [facility] place. Complaining of right sided shoulder/neck pain. EMS attempted to place a collar but pt would not tolerate so they splinted area as best as they could.

## 2014-09-09 NOTE — Discharge Instructions (Signed)

## 2014-09-09 NOTE — ED Provider Notes (Signed)
CSN: [id number]     Arrival date & time 09/09/14  1534 History   First MD Initiated Contact with Patient 09/09/14 1549     Chief Complaint  Patient presents with  . Fall  . Shoulder Pain  . Neck Injury     (Consider location/radiation/quality/duration/timing/severity/associated sxs/prior Treatment) HPI Comments: Patient resides in [facility].  Patient's daughter reports they were doing some work to the tile in the hallway and had put wet floor leveler on the floor, but did not have it marked patient was walking down the hall and fell onto the wet floor leveler.  She reports hitting the right side of her head but does not think she got knocked out.  She is complaining of mild headache as well as right-sided and posterior neck pain and mid thoracic/lumbar back pain.   Patient is a 81 y.o. female presenting with fall. The history is provided by the patient. No language interpreter was used.  Fall This is a new problem. The current episode started 1 to 2 hours ago. The problem occurs rarely. The problem has not changed since onset.Associated symptoms include headaches. Pertinent negatives include no chest pain, no abdominal pain and no shortness of breath. Associated symptoms comments: Neck and mid back pain. Nothing aggravates the symptoms. Nothing relieves the symptoms. She has tried nothing for the symptoms. The treatment provided no relief.    Past Medical History  Diagnosis Date  . Osteoporosis   . Depression   . Anxiety   . COPD (chronic obstructive pulmonary disease)   . GERD (gastroesophageal reflux disease)   . Abuse     benzos/narcotics  . Migraines   . Scoliosis   . Insomnia, unspecified   . Vitamin B deficiency   . Weight loss   . Chronic mental illness   . Arterial tortuosity (aorta) 12/11/2012  . Chronic kidney disease, stage IV (severe) 12/11/2012  . Substance abuse     Benzos and Narcotics, she does not get any of those medicines from [facility], we have empahtically  told her that.   . Vertigo   . Colitis    Past Surgical History  Procedure Laterality Date  . Cholecystectomy    . Partial hysterectomy    . Abdominal hysterectomy    . Cataract extraction     Family History  Problem Relation Age of Onset  . Stroke Mother   . Diabetes Mother   . Cancer Father 95    esophageal  . Heart disease Mother   . Hearing loss Mother    History  Substance Use Topics  . Smoking status: Former Smoker -- 0.50 packs/day    Types: Cigarettes  . Smokeless tobacco: Never Used  . Alcohol Use: No   OB History    No data available     Review of Systems  Constitutional: Negative for fever, chills, diaphoresis, activity change, appetite change and fatigue.  HENT: Negative for congestion, facial swelling, rhinorrhea and sore throat.   Eyes: Negative for photophobia and discharge.  Respiratory: Negative for cough, chest tightness and shortness of breath.   Cardiovascular: Negative for chest pain, palpitations and leg swelling.  Gastrointestinal: Negative for nausea, vomiting, abdominal pain and diarrhea.  Endocrine: Negative for polydipsia and polyuria.  Genitourinary: Negative for dysuria, frequency, difficulty urinating and pelvic pain.  Musculoskeletal: Negative for back pain, arthralgias, neck pain and neck stiffness.  Skin: Negative for color change and wound.  Allergic/Immunologic: Negative for immunocompromised state.  Neurological: Positive for headaches. Negative for facial  asymmetry, weakness and numbness.  Hematological: Does not bruise/bleed easily.  Psychiatric/Behavioral: Negative for confusion and agitation.      Allergies  Macrobid; Azithromycin; Doxycycline; Escitalopram oxalate; Fioricet; Latex; Levofloxacin; Nsaids; Oxycodone; Restasis; Sulfa antibiotics; Tylenol; and Biaxin  Home Medications   Prior to Admission medications   Medication Sig Start Date End Date Taking? Authorizing Provider  Sennosides (SENNA LAX PO) Take 2 tablets  by mouth 2 (two) times daily. Take 2 tablets in the morning and take 2 tablets at bedtime.   Yes Historical Provider, MD  acetaminophen (TYLENOL) 325 MG tablet Take 650 mg by mouth every 6 (six) hours as needed for headache.    Historical Provider, MD  aspirin 81 MG chewable tablet Chew 1 tablet (81 mg total) by mouth daily. 12/12/12   [name], MD  Cholecalciferol (VITAMIN D) 1000 UNITS capsule Take 1,000 Units by mouth daily.    Historical Provider, MD  clorazepate (TRANXENE) 7.5 MG tablet Take 7.5 mg by mouth 2 (two) times daily.  12/23/12   Estill Dooms, MD  Cranberry 425 MG CAPS Take 1 capsule by mouth 2 (two) times daily.    Historical Provider, MD  diclofenac (FLECTOR) 1.3 % PTCH Place 1 patch onto the skin every 12 (twelve) hours as needed (For localized pain.).     Historical Provider, MD  divalproex (DEPAKOTE) 250 MG DR tablet Take 250 mg by mouth daily.    Historical Provider, MD  docusate sodium (COLACE) 100 MG capsule Take 100 mg by mouth 2 (two) times daily.    Historical Provider, MD  dorzolamide-timolol (COSOPT) 22.3-6.8 MG/ML ophthalmic solution Place 1 drop into both eyes 2 (two) times daily.    Historical Provider, MD  fludrocortisone (FLORINEF) 0.1 MG tablet Take 0.1 mg by mouth every morning.    Historical Provider, MD  Ginkgo Biloba 120 MG CAPS Take 120 mg by mouth 2 (two) times daily.     Historical Provider, MD  guaiFENesin (ROBITUSSIN) 100 MG/5ML liquid Take 200 mg by mouth every 6 (six) hours as needed for cough.    Historical Provider, MD  HYDROcodone-acetaminophen (NORCO) 10-325 MG per tablet Take 1 tablet by mouth every 6 (six) hours as needed (pain). Patient taking differently: Take 1 tablet by mouth 3 (three) times daily. Scheduled at 6am, 12 noon, and 6pm. And may take 1 tablet at 12 midnight as needed for pain 04/02/14   Theodis Blaze, MD  hydrocortisone (ANUSOL-HC) 2.5 % rectal cream Place 1 application rectally 2 (two) times daily as needed for hemorrhoids  or itching.    Historical Provider, MD  levocetirizine (XYZAL) 5 MG tablet Take 5 mg by mouth daily as needed for allergies.    Historical Provider, MD  LORazepam (ATIVAN) 0.5 MG tablet Take 0.25 mg by mouth at bedtime.    Historical Provider, MD  meclizine (ANTIVERT) 25 MG tablet Take 25 mg by mouth 3 (three) times daily as needed for dizziness. Do not combine with Hydroxyzine or any other antihistamine. 11/12/13   Thao P Le, DO  Melatonin 1 MG TABS Take 1 mg by mouth at bedtime.     Historical Provider, MD  menthol-cetylpyridinium (CEPACOL) 3 MG lozenge Take 1 lozenge by mouth every 2 (two) hours as needed for sore throat.    Historical Provider, MD  mirtazapine (REMERON) 45 MG tablet Take 45 mg by mouth at bedtime.    Historical Provider, MD  nitroGLYCERIN (NITROSTAT) 0.4 MG SL tablet Place 0.4 mg under the tongue every 5 (  five) minutes as needed for chest pain. May use 3 times    Historical Provider, MD  ondansetron (ZOFRAN) 4 MG tablet Take 4 mg by mouth every 4 (four) hours as needed for nausea. 12/12/12   [name], MD  OVER THE COUNTER MEDICATION Apply 1 scoop topically once a week. Vinegar Wash: provide and mix 1 tabletspoonful white distilled vinegar to 2 cups of warm water to [employer] weekly to prevent UTI    Historical Provider, MD  Polyethyl Glycol-Propyl Glycol (SYSTANE OP) Apply 1 drop to eye every 12 (twelve) hours as needed (for dry eyes).     Historical Provider, MD  polyethylene glycol (MIRALAX / GLYCOLAX) packet Take 17 g by mouth daily.    Historical Provider, MD  PRESCRIPTION MEDICATION Take 1 Container by mouth 2 (two) times daily between meals. Med Pass    Historical Provider, MD  Pseudoephedrine-Acetaminophen (CEPACOL SORE THROAT PO) Take 1 tablet by mouth every 2 (two) hours as needed (sore throat).    Historical Provider, MD  QUEtiapine Fumarate (SEROQUEL XR) 150 MG 24 hr tablet Take 150 mg by mouth daily at 8 pm.    Historical Provider, MD  ranitidine (ZANTAC) 150 MG  tablet Take 150 mg by mouth 2 (two) times daily.    Historical Provider, MD  sodium chloride (OCEAN) 0.65 % SOLN nasal spray Place 2 sprays into both nostrils every 4 (four) hours as needed (For nasal congestion.).    Historical Provider, MD  travoprost, benzalkonium, (TRAVATAN) 0.004 % ophthalmic solution Place 1 drop into both eyes at bedtime.    Historical Provider, MD  triazolam (HALCION) 0.25 MG tablet Take 1 tablet (0.25 mg total) by mouth at bedtime. 05/15/14   Leonard Schwartz, MD   BP 180/82 mmHg  Pulse 77  Temp(Src) 97.9 F (36.6 C) (Oral)  Resp 20  SpO2 99% Physical Exam  Constitutional: She is oriented to person, place, and time. She appears well-developed and well-nourished. No distress.  HENT:  Head: Normocephalic and atraumatic.    Mouth/Throat: No oropharyngeal exudate.  Eyes: Pupils are equal, round, and reactive to light.  Neck: Normal range of motion. Neck supple.    Cardiovascular: Normal rate, regular rhythm and normal heart sounds.  Exam reveals no gallop and no friction rub.   No murmur heard. Pulmonary/Chest: Effort normal and breath sounds normal. No respiratory distress. She has no wheezes. She has no rales.  Abdominal: Soft. Bowel sounds are normal. She exhibits no distension and no mass. There is no tenderness. There is no rebound and no guarding.  Musculoskeletal: Normal range of motion. She exhibits no edema or tenderness.       Arms: Neurological: She is alert and oriented to person, place, and time.  Skin: Skin is warm and dry.  Psychiatric: She has a normal mood and affect.    ED Course  Procedures (including critical care time) Labs Review Labs Reviewed - No data to display  Imaging Review Dg Thoracic Spine 2 View  09/09/2014   CLINICAL DATA:  [place] in what cement. Thoracic back pain. Initial encounter.  EXAM: THORACIC SPINE 2 VIEWS  COMPARISON:  09/23/2011  FINDINGS: There is no evidence of acute thoracic spine fracture. Alignment is normal. No  other significant bone abnormalities are identified.  Mild to moderate degenerative disc disease is seen throughout the thoracic spine. No focal lytic or sclerotic bone lesions identified. Mild to moderate S-shaped thoracolumbar scoliosis again demonstrated. Lumbar spine degenerative changes again noted.  IMPRESSION: No acute  findings.  Thoracolumbar spondylosis and S-shaped scoliosis, without significant change.   Electronically Signed   By: Earle Gell M.D.   On: 09/09/2014 17:04   Dg Lumbar Spine Complete  09/09/2014   CLINICAL DATA:  [place] into wet cement  EXAM: LUMBAR SPINE - COMPLETE 4+ VIEW  COMPARISON:  09/23/2011  FINDINGS: There is marked left convex scoliosis at L3. The lumbar vertebrae are normal in height except for mild chronic compression at L3. No bone lesion or bony destruction is evident. There are severe degenerative disc changes from L2 through L5. There is no evidence of acute fracture. No interval change is evident from 09/23/2011.  IMPRESSION: Severe curvature and lumbar degenerative disc disease. No evidence of superimposed acute fracture.   Electronically Signed   By: Andreas Newport M.D.   On: 09/09/2014 17:03   Ct Head Wo Contrast  09/09/2014   CLINICAL DATA:  Right shoulder and neck pain after falling on wet cement at a nursing facility.  EXAM: CT HEAD WITHOUT CONTRAST  CT CERVICAL SPINE WITHOUT CONTRAST  TECHNIQUE: Multidetector CT imaging of the head and cervical spine was performed following the standard protocol without intravenous contrast. Multiplanar CT image reconstructions of the cervical spine were also generated.  COMPARISON:  Previous examinations, the most recent dated 03/30/2014.  FINDINGS: CT HEAD FINDINGS  Diffusely enlarged ventricles and subarachnoid spaces. Patchy white matter low density in both cerebral hemispheres. No skull fracture, intracranial hemorrhage or paranasal sinus air-fluid levels. Midline and left frontal scalp lacerations are noted.  CT CERVICAL  SPINE FINDINGS  Stable solid anterior bone fusion at the C3-4 level and at the C5 through C7 levels. No significant change in anterolisthesis at the C4-5 level, measuring 4 mm by my measurements today and previously. Associated facet degenerative changes at that level and other levels. No prevertebral soft tissue swelling, acute fractures or acute subluxations. Old, corticated fractures of the tips of the right inferior C4 facets are stable. Biapical pleural and parenchymal scarring and minimal bullous changes are again demonstrated. Calcified pannus posterior to the dens is also unchanged.  IMPRESSION: 1. Frontal scalp lacerations without skull fracture or intracranial hemorrhage. 2. No cervical spine fracture or acute subluxation. 3. Stable atrophy and chronic small vessel white matter ischemic changes. 4. Stable cervical spine postsurgical, degenerative and old posttraumatic changes.   Electronically Signed   By: Claudie Revering M.D.   On: 09/09/2014 17:16   Ct Cervical Spine Wo Contrast  09/09/2014   CLINICAL DATA:  Right shoulder and neck pain after falling on wet cement at a nursing facility.  EXAM: CT HEAD WITHOUT CONTRAST  CT CERVICAL SPINE WITHOUT CONTRAST  TECHNIQUE: Multidetector CT imaging of the head and cervical spine was performed following the standard protocol without intravenous contrast. Multiplanar CT image reconstructions of the cervical spine were also generated.  COMPARISON:  Previous examinations, the most recent dated 03/30/2014.  FINDINGS: CT HEAD FINDINGS  Diffusely enlarged ventricles and subarachnoid spaces. Patchy white matter low density in both cerebral hemispheres. No skull fracture, intracranial hemorrhage or paranasal sinus air-fluid levels. Midline and left frontal scalp lacerations are noted.  CT CERVICAL SPINE FINDINGS  Stable solid anterior bone fusion at the C3-4 level and at the C5 through C7 levels. No significant change in anterolisthesis at the C4-5 level, measuring 4 mm  by my measurements today and previously. Associated facet degenerative changes at that level and other levels. No prevertebral soft tissue swelling, acute fractures or acute subluxations. Old, corticated fractures of the  tips of the right inferior C4 facets are stable. Biapical pleural and parenchymal scarring and minimal bullous changes are again demonstrated. Calcified pannus posterior to the dens is also unchanged.  IMPRESSION: 1. Frontal scalp lacerations without skull fracture or intracranial hemorrhage. 2. No cervical spine fracture or acute subluxation. 3. Stable atrophy and chronic small vessel white matter ischemic changes. 4. Stable cervical spine postsurgical, degenerative and old posttraumatic changes.   Electronically Signed   By: Claudie Revering M.D.   On: 09/09/2014 17:16     EKG Interpretation None      MDM   Final diagnoses:  Fall    Pt is a 81 y.o. female with Pmhx as above who presents with generalized back pain, neck pain, R shoulder pain after she fell into unmarked wet floor leveler at [facility] today.  She states she hit the right side of her head but did not lose consciousness.  She is also complaining of neck and back pain.  She is on no anticoagulants.  She is a baseline mental status.  The wet cement material has been removed from skin.  She has no underlying burns.  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  We'll get CT head, C-spine and x-rays of T and L-spine.   No acute imaging findings. Of note, frontal laceration seen on CT is clinically NOT present.   [name] evaluation in the Emergency Department is complete. It has been determined that no acute conditions requiring further emergency intervention are present at this time. The patient/guardian have been advised of the diagnosis and plan. We have discussed signs and symptoms that warrant return to the ED, such as changes or worsening in symptoms, worsening  pain, numbness, weakness, confusion.       Ernestina Patches, MD 09/09/14 [phone]

## 2014-09-09 NOTE — Progress Notes (Signed)
CSW attempted to meet with patient at bedside, However, she was transported to x-ray. Daughter was present.   Daughter confirms that patient presents to [facility] due to fall. She states that the patient fell today due to unmarked concrete. According to daughter, the facility is currently doing Architect. She states that the patient does not have good eyesight.   Daughter states that the patient does not fall often and that she receives assistance with completing ADL's.   Daughter states that the patient does have a good support system. She states that she lives in [place], but the patient has another daughter who lives in [place] and visits Weekly.  Daughter states that she feels the patient is at the proper level of care.  CSW gave daughter ombudsman information.  Mayra Neer Daughter [facility]) [phone] [name] [name]/ Daughter [facility]) [phone]  Willette Brace [id number] ED CSW 09/09/2014 7:14 PM

## 2014-09-15 NOTE — Progress Notes (Signed)
Orthopedic Tech Progress Note Patient Details:  Taylor Roth February 03, 1929 [id number]  Ortho Devices Type of Ortho Device: Soft collar   Irish Elders 09/15/2014, 6:07 PM

## 2014-09-19 ENCOUNTER — Other Ambulatory Visit: Payer: Self-pay | Admitting: Family Medicine

## 2014-12-25 ENCOUNTER — Encounter (HOSPITAL_COMMUNITY): Payer: Self-pay | Admitting: *Deleted

## 2014-12-25 ENCOUNTER — Emergency Department (HOSPITAL_COMMUNITY): Payer: Medicare Other

## 2014-12-25 ENCOUNTER — Emergency Department (HOSPITAL_COMMUNITY)
Admission: EM | Admit: 2014-12-25 | Discharge: 2014-12-25 | Disposition: A | Discharge status: Home / Self Care | Payer: Medicare Other | Attending: Emergency Medicine | Admitting: Emergency Medicine

## 2014-12-25 DIAGNOSIS — F419 Anxiety disorder, unspecified: Secondary | ICD-10-CM | POA: Diagnosis not present

## 2014-12-25 DIAGNOSIS — F329 Major depressive disorder, single episode, unspecified: Secondary | ICD-10-CM | POA: Diagnosis not present

## 2014-12-25 DIAGNOSIS — Z91040 Latex allergy status: Secondary | ICD-10-CM | POA: Insufficient documentation

## 2014-12-25 DIAGNOSIS — F99 Mental disorder, not otherwise specified: Secondary | ICD-10-CM | POA: Diagnosis not present

## 2014-12-25 DIAGNOSIS — Z7982 Long term (current) use of aspirin: Secondary | ICD-10-CM | POA: Diagnosis not present

## 2014-12-25 DIAGNOSIS — Z8639 Personal history of other endocrine, nutritional and metabolic disease: Secondary | ICD-10-CM | POA: Insufficient documentation

## 2014-12-25 DIAGNOSIS — N390 Urinary tract infection, site not specified: Secondary | ICD-10-CM | POA: Diagnosis not present

## 2014-12-25 DIAGNOSIS — J449 Chronic obstructive pulmonary disease, unspecified: Secondary | ICD-10-CM | POA: Diagnosis not present

## 2014-12-25 DIAGNOSIS — G4700 Insomnia, unspecified: Secondary | ICD-10-CM | POA: Insufficient documentation

## 2014-12-25 DIAGNOSIS — Z8739 Personal history of other diseases of the musculoskeletal system and connective tissue: Secondary | ICD-10-CM | POA: Diagnosis not present

## 2014-12-25 DIAGNOSIS — G43909 Migraine, unspecified, not intractable, without status migrainosus: Secondary | ICD-10-CM | POA: Insufficient documentation

## 2014-12-25 DIAGNOSIS — R112 Nausea with vomiting, unspecified: Secondary | ICD-10-CM | POA: Diagnosis present

## 2014-12-25 DIAGNOSIS — Z79899 Other long term (current) drug therapy: Secondary | ICD-10-CM | POA: Diagnosis not present

## 2014-12-25 DIAGNOSIS — K219 Gastro-esophageal reflux disease without esophagitis: Secondary | ICD-10-CM | POA: Insufficient documentation

## 2014-12-25 DIAGNOSIS — N184 Chronic kidney disease, stage 4 (severe): Secondary | ICD-10-CM | POA: Insufficient documentation

## 2014-12-25 DIAGNOSIS — Z87891 Personal history of nicotine dependence: Secondary | ICD-10-CM | POA: Diagnosis not present

## 2014-12-25 DIAGNOSIS — Z8679 Personal history of other diseases of the circulatory system: Secondary | ICD-10-CM | POA: Diagnosis not present

## 2014-12-25 LAB — URINALYSIS, ROUTINE W REFLEX MICROSCOPIC
BILIRUBIN URINE: NEGATIVE
GLUCOSE, UA: NEGATIVE mg/dL
Hgb urine dipstick: NEGATIVE
KETONES UR: NEGATIVE mg/dL
Nitrite: POSITIVE — AB
PH: 7 (ref 5.0–8.0)
Protein, ur: NEGATIVE mg/dL
SPECIFIC GRAVITY, URINE: 1.021 (ref 1.005–1.030)
Urobilinogen, UA: 0.2 mg/dL (ref 0.0–1.0)

## 2014-12-25 LAB — COMPREHENSIVE METABOLIC PANEL
ALT: 22 U/L (ref 14–54)
AST: 25 U/L (ref 15–41)
Albumin: 4.1 g/dL (ref 3.5–5.0)
Alkaline Phosphatase: 111 U/L (ref 38–126)
Anion gap: 7 (ref 5–15)
BUN: 24 mg/dL — ABNORMAL HIGH (ref 6–20)
CO2: 29 mmol/L (ref 22–32)
Calcium: 8.9 mg/dL (ref 8.9–10.3)
Chloride: 105 mmol/L (ref 101–111)
Creatinine, Ser: 0.84 mg/dL (ref 0.44–1.00)
Glucose, Bld: 103 mg/dL — ABNORMAL HIGH (ref 65–99)
POTASSIUM: 4.1 mmol/L (ref 3.5–5.1)
SODIUM: 141 mmol/L (ref 135–145)
Total Bilirubin: 0.4 mg/dL (ref 0.3–1.2)
Total Protein: 6.9 g/dL (ref 6.5–8.1)

## 2014-12-25 LAB — CBC
HCT: 40.7 % (ref 36.0–46.0)
Hemoglobin: 12.7 g/dL (ref 12.0–15.0)
MCH: 29.5 pg (ref 26.0–34.0)
MCHC: 31.2 g/dL (ref 30.0–36.0)
MCV: 94.4 fL (ref 78.0–100.0)
Platelets: 200 10*3/uL (ref 150–400)
RBC: 4.31 MIL/uL (ref 3.87–5.11)
RDW: 14.2 % (ref 11.5–15.5)
WBC: 6 10*3/uL (ref 4.0–10.5)

## 2014-12-25 LAB — URINE MICROSCOPIC-ADD ON

## 2014-12-25 LAB — LIPASE, BLOOD: LIPASE: 31 U/L (ref 11–51)

## 2014-12-25 MED ORDER — DEXTROSE 5 % IV SOLN
1.0000 g | Freq: Once | INTRAVENOUS | Status: AC
  Administered 2014-12-25: 1 g via INTRAVENOUS
  Filled 2014-12-25: qty 10

## 2014-12-25 MED ORDER — CEPHALEXIN 500 MG PO CAPS: 500.0000 mg | ORAL_CAPSULE | Freq: Four times a day (QID) | ORAL | Status: DC

## 2014-12-25 MED ORDER — ONDANSETRON HCL 4 MG/2ML IJ SOLN
4.0000 mg | Freq: Once | INTRAMUSCULAR | Status: AC | PRN
  Administered 2014-12-25: 4 mg via INTRAVENOUS
  Filled 2014-12-25: qty 2

## 2014-12-25 MED ORDER — ONDANSETRON HCL 4 MG PO TABS: 4.0000 mg | ORAL_TABLET | Freq: Four times a day (QID) | ORAL | Status: DC

## 2014-12-25 MED ORDER — ONDANSETRON HCL 4 MG/2ML IJ SOLN
4.0000 mg | Freq: Once | INTRAMUSCULAR | Status: AC
  Administered 2014-12-25: 4 mg via INTRAVENOUS
  Filled 2014-12-25: qty 2

## 2014-12-25 MED ORDER — SODIUM CHLORIDE 0.9 % IV BOLUS (SEPSIS)
1000.0000 mL | Freq: Once | INTRAVENOUS | Status: AC
  Administered 2014-12-25: 1000 mL via INTRAVENOUS

## 2014-12-25 NOTE — ED Notes (Signed)
Awake. Verbally responsive. A/O x4. Resp even and unlabored. No audible adventitious breath sounds noted. ABC's intact. IV infusing NS at 999ml/hr without difficulty. 

## 2014-12-25 NOTE — ED Notes (Signed)
Awake. Verbally responsive. A/O x4. Resp even and unlabored. No audible adventitious breath sounds noted. ABC's intact.  

## 2014-12-25 NOTE — ED Notes (Addendum)
Pt refused xray 

## 2014-12-25 NOTE — Discharge Instructions (Signed)
Nausea and Vomiting Stay well hydrated.  Take zofran for nausea.  Follow up with a primary care physician or use the resource guide below to find one. Nausea is a sick feeling that often comes before throwing up (vomiting). Vomiting is a reflex where stomach contents come out of your mouth. Vomiting can cause severe loss of body fluids (dehydration). Children and elderly adults can become dehydrated quickly, especially if they also have diarrhea. Nausea and vomiting are symptoms of a condition or disease. It is important to find the cause of your symptoms. CAUSES   Direct irritation of the stomach lining. This irritation can result from increased acid production (gastroesophageal reflux disease), infection, food poisoning, taking certain medicines (such as nonsteroidal anti-inflammatory drugs), alcohol use, or tobacco use.  Signals from the brain.These signals could be caused by a headache, heat exposure, an inner ear disturbance, increased pressure in the brain from injury, infection, a tumor, or a concussion, pain, emotional stimulus, or metabolic problems.  An obstruction in the gastrointestinal tract (bowel obstruction).  Illnesses such as diabetes, hepatitis, gallbladder problems, appendicitis, kidney problems, cancer, sepsis, atypical symptoms of a heart attack, or eating disorders.  Medical treatments such as chemotherapy and radiation.  Receiving medicine that makes you sleep (general anesthetic) during surgery. DIAGNOSIS Your caregiver may ask for tests to be done if the problems do not improve after a few days. Tests may also be done if symptoms are severe or if the reason for the nausea and vomiting is not clear. Tests may include:  Urine tests.  Blood tests.  Stool tests.  Cultures (to look for evidence of infection).  X-rays or other imaging studies. Test results can help your caregiver make decisions about treatment or the need for additional tests. TREATMENT You need to  stay well hydrated. Drink frequently but in small amounts.You may wish to drink water, sports drinks, clear broth, or eat frozen ice pops or gelatin dessert to help stay hydrated.When you eat, eating slowly may help prevent nausea.There are also some antinausea medicines that may help prevent nausea. HOME CARE INSTRUCTIONS   Take all medicine as directed by your caregiver.  If you do not have an appetite, do not force yourself to eat. However, you must continue to drink fluids.  If you have an appetite, eat a normal diet unless your caregiver tells you differently.  Eat a variety of complex carbohydrates (rice, wheat, potatoes, bread), lean meats, yogurt, fruits, and vegetables.  Avoid high-fat foods because they are more difficult to digest.  Drink enough water and fluids to keep your urine clear or pale yellow.  If you are dehydrated, ask your caregiver for specific rehydration instructions. Signs of dehydration may include:  Severe thirst.  Dry lips and mouth.  Dizziness.  Dark urine.  Decreasing urine frequency and amount.  Confusion.  Rapid breathing or pulse. SEEK IMMEDIATE MEDICAL CARE IF:   You have blood or brown flecks (like coffee grounds) in your vomit.  You have black or bloody stools.  You have a severe headache or stiff neck.  You are confused.  You have severe abdominal pain.  You have chest pain or trouble breathing.  You do not urinate at least once every 8 hours.  You develop cold or clammy skin.  You continue to vomit for longer than 24 to 48 hours.  You have a fever. MAKE SURE YOU:   Understand these instructions.  Will watch your condition.  Will get help right away if you are not  doing well or get worse.   This information is not intended to replace advice given to you by your health care provider. Make sure you discuss any questions you have with your health care provider.   Document Released: 01/29/2005 Document Revised:  04/23/2011 Document Reviewed: 06/28/2010 Elsevier Interactive Patient Education 2016 [employer].  Emergency Department Resource Guide 1) Find a Doctor and Pay Out of Pocket Although you won't have to find out who is covered by your insurance plan, it is a good idea to ask around and get recommendations. You will then need to call the office and see if the doctor you have chosen will accept you as a new patient and what types of options they offer for patients who are self-pay. Some doctors offer discounts or will set up payment plans for their patients who do not have insurance, but you will need to ask so you aren't surprised when you get to your appointment.  2) Contact Your Local Health Department Not all health departments have doctors that can see patients for sick visits, but many do, so it is worth a call to see if yours does. If you don't know where your local health department is, you can check in your phone book. The CDC also has a tool to help you locate your state's health department, and many state websites also have listings of all of their local health departments.  3) Find a [facility] If your illness is not likely to be very severe or complicated, you may want to try a walk in clinic. These are popping up all over the country in pharmacies, drugstores, and shopping centers. They're usually staffed by nurse practitioners or physician assistants that have been trained to treat common illnesses and complaints. They're usually fairly quick and inexpensive. However, if you have serious medical issues or chronic medical problems, these are probably not your best option.  No Primary Care Doctor: - Call Health Connect at  [phone] - they can help you locate a primary care doctor that  accepts your insurance, provides certain services, etc. - Physician Referral Service- [phone]  Chronic Pain Problems: Organization         Address  Phone   Notes  [facility]  [phone] Patients need to be referred by their primary care doctor.   Medication Assistance: Organization         Address  Phone   Notes  [facility] Medication [facility] [address] [place], [place] [address] [phone] --Must be a resident of [facility] -- Must have NO insurance coverage whatsoever (no Medicaid/ Medicare, etc.) -- The pt. MUST have a primary care doctor that directs their care regularly and follows them in the community   Med[facility]ist  [phone]   [employer]  [phone]    Agencies that provide inexpensive medical care: Organization         Address  Phone   Notes  [place]  [phone]   [facility]    [phone]   [facility] [place], [place] [address] [phone]   [place] [address] [place] [phone]   Planned Parenthood    [phone]   [facility]    [phone]   [place], [place] Phone:  [phone], Fax:  [phone] Hours  of Operation:  9 am - 6 pm, M-F.  Also accepts Medicaid/Medicare and self-pay.  [facility] for [place], [address], [place] Phone: [phone], Fax: [phone]. Hours of Operation:  8:30 am - 5:30 pm, M-F.  Also accepts Medicaid and self-pay.  [facility] [address], [place] Phone: [phone]   [place], [place], [place] [phone], Ext. [phone] Mondays & Thursdays: 7-9 AM.  First 15 patients are seen on a first come, first serve basis.    [place] Providers:  Organization         Address  Phone   Notes  [facility] [address], [place] [phone] Also accepts self-pay patients.  [facility] [id number] [place], [place]  [phone]   [place], [address], [place] [phone]   [facility] [address], [place] [phone]   Lucianne Lei [address], [place]   [phone] Only accepts [place] Access [place] patients after they have their name applied to their card.   Self-Pay (no insurance) in [facility]:  Organization         Address  Phone   Notes  Sickle Cell Patients, [facility] [place] [phone]   [facility] [place] [phone]   [facility] [place]  [place], [place], [place] [phone]   [facility]/Dr. Osei-Bonsu  [address], [place] or [address], [place] [phone] Phone number for both [place] and [place] locations is the same.  [facility] [address], [employer] [phone]   [facility] [address] [place] or [address] [phone]   [facility] [address] [place] [phone], phone; [phone], fax Sees patients 1st and 3rd Saturday of every month.  Must not qualify for public or private insurance (i.e. Medicaid, Medicare, [place] Health Choice, Veterans' Benefits)  Household income should be no more than 200% of the poverty level The clinic cannot treat you if you are pregnant or think you are pregnant  Sexually transmitted diseases are not treated at the clinic.    Dental Care: Organization         Address  Phone  Notes  [facility] Department of [facility] [phone] Accepts children up to age 8 who are enrolled in [place] or [place]; pregnant women with a Medicaid card; and children who have applied for Medicaid or [place] Health  Choice, but were declined, whose parents can pay a reduced fee at time of service.  [facility] Department of [facility]  [address], [place] [phone] Accepts children up to age 51 who are enrolled in [place] or [place]; pregnant women with a Medicaid card; and children who have applied for Medicaid or [place] Health Choice, but were declined, whose parents can pay a reduced fee at time of service.  [facility] Adult Dental Access PROGRAM  [place] [phone] Patients are seen by appointment only. Walk-ins are not accepted. [place] will see  patients 43 years of age and older. Monday - Tuesday (8am-5pm) Most Wednesdays (8:30-5pm) $30 per visit, cash only   [facility] Adult Dental Access PROGRAM  [address], [facility] [phone] Patients are seen by appointment only. Walk-ins are not accepted. [place] will see patients 22 years of age and older. One Wednesday Evening (Monthly: Volunteer Based).  $30 per visit, cash only  [place]  [phone] for adults; Children under age 53, call Graduate Pediatric Dentistry at [phone]. Children aged 36-14, please call [phone] to request a pediatric application.  Dental services are provided in all areas of dental care including fillings, crowns and bridges, complete and partial dentures, implants, gum treatment, root canals, and extractions. Preventive care is also provided. Treatment is provided to both adults and children. Patients are selected via a lottery and there is often a waiting list.   [facility] [address] [place]  [phone] [email or web address]   [facility] [address] [place], [place] [phone], Ext. [phone] Second and Fourth Thursday of each month, opens at 6:30 AM; Clinic ends at 9 AM.  Patients are seen on a first-come first-served basis, and a limited number are seen during each  clinic.   [facility]  [address] [place], [place] [phone]   Eligibility Requirements You must have lived in [place], [place], or [place] counties for at least the last three months.   You cannot be eligible for state or federal sponsored [employer], including [employer], [place], or [employer].   You generally cannot be eligible for healthcare insurance through your employer.    How to apply: Eligibility screenings are held every Tuesday and Wednesday afternoon from 1:00 pm until 4:00 pm. You do not need an appointment for the interview!  [facility] [address] [place], [place]   [place]  [place] Department  [place]  [phone]    Behavioral Health Resources in the Community: Intensive Outpatient Programs Organization         Address  Phone  Notes  Narberth Bolivar. [address] [place], [place] [phone]   [facility]patient [address], [place], [place]   ADS: Alcohol & Drug Svcs [address], [place], [place]   [place] [address]  [place], [place] or [phone]   Substance Abuse Resources Organization         Address  Phone  Notes  [facility] Drug Services  [phone]   [place]  [phone]   [facility] [place]   Chinita Pester  [phone]   Residential & Outpatient Substance Abuse Program  [phone]   Psychological Services Organization         Address  Phone  Notes  [facility]  [place]  [phone]   [place] [address] [place] or [phone]    Mobile Crisis Teams Organization         Address  Phone  Notes  [facility]rapeutic Alternatives, Mobile  Crisis Care Unit  [phone]   [facility]ive Psychotherapeutic Services  [address] [place], [place]   [facility] [address] [place] [phone]    Self-Help/Support Groups Organization  Address  Phone             Notes  [place]. of [place] - variety of support groups  [place] Call for more information  Narcotics Anonymous (NA), [facility] [address], [employer] [place]  2 meetings at this location   Special educational needs teacher         Address  Phone  Notes  ASAP Residential Treatment [place],    [place]  [phone]   [facility]  [address] [place] [phone], [place], [place]   [place], [place] [phone] Admissions: 8am-3pm M-F  Incentives Substance [place] [address],    [place], [place] [phone]   [facility] [address] [place], [place], [place]   [facility] [address]  [place], [place]   [facility]ight Programs - Intensive Outpatient [address] Kristeen [address], [place], [place]   [facility] ([place].) [place].,  [place], [place] [phone] or [phone]   [facility] Services (RTS) [address] [place], [place] Accepts Medicaid  [facility] [place] [address]  [place] [phone] Substance Abuse/Addiction Treatment   [facility] Organization         Address  Phone  Notes  [facility]  [phone]   Domenic Schwab, PhD [address] [place], [place]   [phone] or [phone]   [place], [place] [phone]   [facility] [address], [place], [place] [phone] Insurance/Medicaid/sponsorship through [facility] and Families [address], [address]                                    [place], [place] [phone] [place] [address], [place] [phone]    Dr. Adele Schilder  [phone]   [facility] Dept. 1) [address], [place] 2) [place] 3)  [place] [address], [place] [phone]  [phone]   [place] [phone] or [phone] (After Hours)

## 2014-12-25 NOTE — ED Notes (Signed)
Called communications to arrange for transportation back to facility.

## 2014-12-25 NOTE — ED Notes (Signed)
Awake. Verbally responsive. A/O x4. Resp even and unlabored. No audible adventitious breath sounds noted. ABC's intact. IV saline lock patent and intact. 

## 2014-12-25 NOTE — ED Notes (Signed)
EMS reports pt states she has been vomiting for th 2 hours without reprieve. Witnessed emesis is more of phlegm and salvia. No other reported symptoms

## 2014-12-25 NOTE — ED Notes (Signed)
Pt reported sudden onset of nausea/emesis x1. Denies diarrhea and abd pain. Abd soft/nondistended and nontender to palpate. LBM was yesterday. Pt reported having trouble swallowing.

## 2014-12-25 NOTE — ED Provider Notes (Signed)
CSN: [id number]     Arrival date & time 12/25/14  1336 History   First MD Initiated Contact with Patient 12/25/14 1458     Chief Complaint  Patient presents with  . Emesis     (Consider location/radiation/quality/duration/timing/severity/associated sxs/prior Treatment) Patient is a 81 y.o. female presenting with vomiting. The history is provided by the patient. No language interpreter was used.  Emesis Associated symptoms: no abdominal pain, no chills and no diarrhea   Ms. Forest Gleason is an [name] with a history of COPD, CKD (stage IV), substance abuse, and colitis who presents from a nursing home for sudden onset of nausea and one episode of vomiting without blood that began 3 hours ago. She states she had a few bites of her lunch before feeling nauseated. She has been dry heaving since and spitting up phlegm and saliva. She states this is happened to her several times in the past without a known cause. She denies any fever, chills, chest pain, cough, shortness of breath, abdominal pain, diarrhea, constipation, dysuria, or hematuria. Her last bowel movement was yesterday.   Past Medical History  Diagnosis Date  . Osteoporosis   . Depression   . Anxiety   . COPD (chronic obstructive pulmonary disease) ([place])   . GERD (gastroesophageal reflux disease)   . Abuse     benzos/narcotics  . Migraines   . Scoliosis   . Insomnia, unspecified   . Vitamin B deficiency   . Weight loss   . Chronic mental illness   . Arterial tortuosity (aorta) 12/11/2012  . Chronic kidney disease, stage IV (severe) ([place]) 12/11/2012  . Substance abuse     Benzos and Narcotics, she does not get any of those medicines from [facility], we have empahtically told her that.   . Vertigo   . Colitis    Past Surgical History  Procedure Laterality Date  . Cholecystectomy    . Partial hysterectomy    . Abdominal hysterectomy    . Cataract extraction     Family History  Problem Relation Age of Onset  . Stroke  Mother   . Diabetes Mother   . Cancer Father 54    esophageal  . Heart disease Mother   . Hearing loss Mother    Social History  Substance Use Topics  . Smoking status: Former Smoker -- 0.50 packs/day    Types: Cigarettes  . Smokeless tobacco: Never Used  . Alcohol Use: No   OB History    No data available     Review of Systems  Constitutional: Negative for fever and chills.  Respiratory: Negative for cough and shortness of breath.   Cardiovascular: Negative for chest pain.  Gastrointestinal: Positive for nausea and vomiting. Negative for abdominal pain, diarrhea, constipation and blood in stool.  Genitourinary: Negative for dysuria.  All other systems reviewed and are negative.     Allergies  Macrobid; Azithromycin; Doxycycline; Escitalopram oxalate; Fioricet; Latex; Levofloxacin; Nsaids; Oxycodone; Restasis; Sulfa antibiotics; Tylenol; and Biaxin  Home Medications   Prior to Admission medications   Medication Sig Start Date End Date Taking? Authorizing Provider  acetaminophen (TYLENOL) 325 MG tablet Take 650 mg by mouth every 6 (six) hours as needed for headache.   Yes Historical Provider, MD  Acetic Acid (DOUCHE VINEGAR/WATER VA) Place 1 each around the anus once a week. To prevent UTI   Yes Historical Provider, MD  aspirin 81 MG chewable tablet Chew 1 tablet (81 mg total) by mouth daily. 12/12/12  Yes Christina  P Rama, MD  Cholecalciferol (VITAMIN D) 1000 UNITS capsule Take 1,000 Units by mouth daily.   Yes Historical Provider, MD  clorazepate (TRANXENE) 7.5 MG tablet Take 7.5 mg by mouth 2 (two) times daily.  12/23/12  Yes Estill Dooms, MD  Cranberry 425 MG CAPS Take 1 capsule by mouth 2 (two) times daily.   Yes Historical Provider, MD  diclofenac (FLECTOR) 1.3 % PTCH Place 1 patch onto the skin every 12 (twelve) hours as needed (For localized pain.).    Yes Historical Provider, MD  divalproex (DEPAKOTE) 250 MG DR tablet Take 250 mg by mouth daily.   Yes Historical  Provider, MD  docusate sodium (COLACE) 100 MG capsule Take 100 mg by mouth 2 (two) times daily.   Yes Historical Provider, MD  dorzolamide-timolol (COSOPT) 22.3-6.8 MG/ML ophthalmic solution Place 1 drop into both eyes 2 (two) times daily.   Yes Historical Provider, MD  DULoxetine (CYMBALTA) 30 MG capsule Take 30 mg by mouth daily.   Yes Historical Provider, MD  fludrocortisone (FLORINEF) 0.1 MG tablet Take 0.1 mg by mouth every morning.   Yes Historical Provider, MD  fluticasone (FLOVENT HFA) 110 MCG/ACT inhaler Inhale 1 puff into the lungs 2 (two) times daily as needed (shortness of breath.).   Yes Historical Provider, MD  Ginkgo Biloba 120 MG CAPS Take 120 mg by mouth 2 (two) times daily.    Yes Historical Provider, MD  HYDROcodone-acetaminophen (NORCO) 10-325 MG per tablet Take 1 tablet by mouth every 6 (six) hours as needed (pain). Patient taking differently: Take 1 tablet by mouth every 6 (six) hours as needed. Scheduled at 6am, 12 noon, and 6pm. And may take 1 tablet at 12 midnight as needed for pain 04/02/14  Yes Theodis Blaze, MD  LORazepam (ATIVAN) 0.5 MG tablet Take 0.25-0.5 mg by mouth at bedtime. Take half a tablet nightly at bedtime. Take a full tablet twice daily as needed for anxiety   Yes Historical Provider, MD  meclizine (ANTIVERT) 25 MG tablet Take 25 mg by mouth 3 (three) times daily as needed for dizziness. Do not combine with Hydroxyzine or any other antihistamine. 11/12/13  Yes Thao P Le, DO  Melatonin 1 MG TABS Take 1 mg by mouth at bedtime.    Yes Historical Provider, MD  mineral oil-hydrophilic petrolatum (AQUAPHOR) ointment Apply 1 application topically daily.   Yes Historical Provider, MD  mirtazapine (REMERON) 45 MG tablet Take 45 mg by mouth at bedtime.   Yes Historical Provider, MD  nitroGLYCERIN (NITROSTAT) 0.4 MG SL tablet Place 0.4 mg under the tongue every 5 (five) minutes as needed for chest pain. May use 3 times   Yes Historical Provider, MD  nystatin (MYCOSTATIN)  100000 UNIT/ML suspension Take 5 mLs by mouth 3 (three) times daily as needed.   Yes Historical Provider, MD  OVER THE COUNTER MEDICATION Apply 1 scoop topically once a week. Vinegar Wash: provide and mix 1 tabletspoonful white distilled vinegar to 2 cups of warm water to Peri Area weekly to prevent UTI   Yes Historical Provider, MD  polyethylene glycol (MIRALAX / GLYCOLAX) packet Take 17 g by mouth daily.   Yes Historical Provider, MD  PRESCRIPTION MEDICATION Take 1 Container by mouth 2 (two) times daily between meals. Med Pass   Yes Historical Provider, MD  QUEtiapine Fumarate (SEROQUEL XR) 150 MG 24 hr tablet Take 150 mg by mouth daily at 8 pm.   Yes Historical Provider, MD  ranitidine (ZANTAC) 150 MG tablet Take  150 mg by mouth 2 (two) times daily as needed for heartburn.   Yes Historical Provider, MD  Sennosides (SENNA LAX PO) Take 2 tablets by mouth 2 (two) times daily. Take 2 tablets in the morning and take 2 tablets at bedtime.   Yes Historical Provider, MD  travoprost, benzalkonium, (TRAVATAN) 0.004 % ophthalmic solution Place 1 drop into both eyes at bedtime.   Yes Historical Provider, MD  triamcinolone (NASACORT ALLERGY 24HR) 55 MCG/ACT AERO nasal inhaler Place 2 sprays into the nose daily as needed (allergies).   Yes Historical Provider, MD  triamcinolone cream (KENALOG) 0.1 % Apply 1 application topically 3 (three) times daily as needed (worsening symtoms then resume as needed).   Yes Historical Provider, MD  triazolam (HALCION) 0.125 MG tablet Take 0.125 mg by mouth at bedtime.   Yes Historical Provider, MD  cephALEXin (KEFLEX) 500 MG capsule Take 1 capsule (500 mg total) by mouth 4 (four) times daily. 12/25/14   Lashonda Sonneborn Patel-Mills, PA-C  FLUZONE HIGH-DOSE 0.5 ML SUSY ADM 0.5ML IM UTD 11/15/14   Historical Provider, MD  ondansetron (ZOFRAN) 4 MG tablet Take 1 tablet (4 mg total) by mouth every 6 (six) hours. 12/25/14   Soundra Lampley Patel-Mills, PA-C   BP 174/83 mmHg  Pulse 75  Temp(Src) 98.3 F  (36.8 C) (Oral)  Resp 18  SpO2 96% Physical Exam  Constitutional: She is oriented to person, place, and time. She appears well-developed and well-nourished. No distress.  Dry heaving at bedside.  HENT:  Head: Normocephalic and atraumatic.  Eyes: Conjunctivae are normal.  Neck: Normal range of motion. Neck supple.  Cardiovascular: Normal rate and regular rhythm.   Pulmonary/Chest: Effort normal and breath sounds normal. No respiratory distress. She has no wheezes. She has no rales.  Lungs are clear to auscultation bilaterally. No wheezing or decreased breath sounds.  Abdominal: Soft. She exhibits no distension. There is no tenderness. There is no rebound and no guarding.  No abdominal distention. Abdomen is soft and nontender. No guarding or rebound.  Musculoskeletal: Normal range of motion.  Neurological: She is alert and oriented to person, place, and time.  Skin: Skin is warm and dry.  Nursing note and vitals reviewed.   ED Course  Procedures (including critical care time) Labs Review Labs Reviewed  COMPREHENSIVE METABOLIC PANEL - Abnormal; Notable for the following:    Glucose, Bld 103 (*)    BUN 24 (*)    All other components within normal limits  URINALYSIS, ROUTINE W REFLEX MICROSCOPIC (NOT AT [facility]) - Abnormal; Notable for the following:    APPearance CLOUDY (*)    Nitrite POSITIVE (*)    Leukocytes, UA MODERATE (*)    All other components within normal limits  URINE MICROSCOPIC-ADD ON - Abnormal; Notable for the following:    Bacteria, UA MANY (*)    All other components within normal limits  URINE CULTURE  LIPASE, BLOOD  CBC    Imaging Review No results found. I have personally reviewed and evaluated these lab results as part of my medical decision-making.   EKG Interpretation None      MDM   Final diagnoses:  Non-intractable vomiting with nausea, vomiting of unspecified type  Urinary tract infection, acute  Patient presents for sudden onset nausea and  vomiting that began 3 hours ago. She denies any fever, abdominal pain, diarrhea, or constipation. She states this is happened to her several times in the past and that she has a known hiatal hernia that she was told,  causes her symptoms. Vital signs are stable and she is well appearing after Zofran. She does have a UTI but otherwise her labs are not concerning.   Medications  ondansetron (ZOFRAN) injection 4 mg (4 mg Intravenous Given 12/25/14 1510)  sodium chloride 0.9 % bolus 1,000 mL (0 mLs Intravenous Stopped 12/25/14 1730)  ondansetron (ZOFRAN) injection 4 mg (4 mg Intravenous Given 12/25/14 1731)  cefTRIAXone (ROCEPHIN) 1 g in dextrose 5 % 50 mL IVPB (0 g Intravenous Stopped 12/25/14 1800)  Patient was given Rx: Keflex for UTI.  Urine culture pending Rx: Zofran for nausea Recheck: Patient is doing much better and has not had any additional vomiting. I spoke to Dr. Tyrone Nine who has seen and evaluated the patient himself and agrees with the plan to send the patient back to the nursing facility. I discussed follow-up with the patient as well as return precautions and she is in agreement with the plan.   Ottie Glazier, PA-C 12/25/14 2040  Deno Etienne, DO 12/25/14 2306

## 2014-12-27 LAB — URINE CULTURE: SPECIAL REQUESTS: NORMAL

## 2015-01-11 ENCOUNTER — Emergency Department (HOSPITAL_COMMUNITY): Payer: Medicare Other

## 2015-01-11 ENCOUNTER — Encounter (HOSPITAL_COMMUNITY): Payer: Self-pay

## 2015-01-11 ENCOUNTER — Inpatient Hospital Stay (HOSPITAL_COMMUNITY)
Admission: EM | Admit: 2015-01-11 | Discharge: 2015-01-14 | DRG: 392 | Disposition: A | Discharge status: Nursing Home (Includes ICF) | Payer: Medicare Other | Attending: Internal Medicine | Admitting: Internal Medicine

## 2015-01-11 DIAGNOSIS — Z7982 Long term (current) use of aspirin: Secondary | ICD-10-CM

## 2015-01-11 DIAGNOSIS — R1319 Other dysphagia: Secondary | ICD-10-CM | POA: Diagnosis present

## 2015-01-11 DIAGNOSIS — Z823 Family history of stroke: Secondary | ICD-10-CM

## 2015-01-11 DIAGNOSIS — R4702 Dysphasia: Secondary | ICD-10-CM | POA: Diagnosis present

## 2015-01-11 DIAGNOSIS — J449 Chronic obstructive pulmonary disease, unspecified: Secondary | ICD-10-CM | POA: Diagnosis present

## 2015-01-11 DIAGNOSIS — E876 Hypokalemia: Secondary | ICD-10-CM | POA: Diagnosis present

## 2015-01-11 DIAGNOSIS — R1110 Vomiting, unspecified: Secondary | ICD-10-CM | POA: Diagnosis not present

## 2015-01-11 DIAGNOSIS — K449 Diaphragmatic hernia without obstruction or gangrene: Secondary | ICD-10-CM | POA: Diagnosis present

## 2015-01-11 DIAGNOSIS — Z8249 Family history of ischemic heart disease and other diseases of the circulatory system: Secondary | ICD-10-CM

## 2015-01-11 DIAGNOSIS — R42 Dizziness and giddiness: Secondary | ICD-10-CM

## 2015-01-11 DIAGNOSIS — K219 Gastro-esophageal reflux disease without esophagitis: Secondary | ICD-10-CM | POA: Diagnosis present

## 2015-01-11 DIAGNOSIS — R112 Nausea with vomiting, unspecified: Secondary | ICD-10-CM

## 2015-01-11 DIAGNOSIS — K5900 Constipation, unspecified: Secondary | ICD-10-CM | POA: Diagnosis present

## 2015-01-11 DIAGNOSIS — Z79899 Other long term (current) drug therapy: Secondary | ICD-10-CM

## 2015-01-11 DIAGNOSIS — I129 Hypertensive chronic kidney disease with stage 1 through stage 4 chronic kidney disease, or unspecified chronic kidney disease: Secondary | ICD-10-CM | POA: Diagnosis present

## 2015-01-11 DIAGNOSIS — K229 Disease of esophagus, unspecified: Secondary | ICD-10-CM

## 2015-01-11 DIAGNOSIS — K222 Esophageal obstruction: Secondary | ICD-10-CM | POA: Diagnosis not present

## 2015-01-11 DIAGNOSIS — K220 Achalasia of cardia: Secondary | ICD-10-CM | POA: Diagnosis present

## 2015-01-11 DIAGNOSIS — Z87891 Personal history of nicotine dependence: Secondary | ICD-10-CM

## 2015-01-11 DIAGNOSIS — M810 Age-related osteoporosis without current pathological fracture: Secondary | ICD-10-CM | POA: Diagnosis present

## 2015-01-11 DIAGNOSIS — N183 Chronic kidney disease, stage 3 unspecified: Secondary | ICD-10-CM | POA: Diagnosis present

## 2015-01-11 DIAGNOSIS — F419 Anxiety disorder, unspecified: Secondary | ICD-10-CM | POA: Diagnosis present

## 2015-01-11 DIAGNOSIS — R1310 Dysphagia, unspecified: Secondary | ICD-10-CM

## 2015-01-11 DIAGNOSIS — H409 Unspecified glaucoma: Secondary | ICD-10-CM | POA: Diagnosis present

## 2015-01-11 DIAGNOSIS — R531 Weakness: Secondary | ICD-10-CM

## 2015-01-11 DIAGNOSIS — Z833 Family history of diabetes mellitus: Secondary | ICD-10-CM

## 2015-01-11 DIAGNOSIS — Z6820 Body mass index (BMI) 20.0-20.9, adult: Secondary | ICD-10-CM

## 2015-01-11 DIAGNOSIS — Z9049 Acquired absence of other specified parts of digestive tract: Secondary | ICD-10-CM

## 2015-01-11 DIAGNOSIS — F329 Major depressive disorder, single episode, unspecified: Secondary | ICD-10-CM | POA: Diagnosis present

## 2015-01-11 HISTORY — DX: Fracture of nasal bones, initial encounter for closed fracture: S02.2XXA

## 2015-01-11 HISTORY — DX: Dysphagia, unspecified: R13.10

## 2015-01-11 HISTORY — DX: Esophageal obstruction: K22.2

## 2015-01-11 LAB — CBC WITH DIFFERENTIAL/PLATELET
BASOS ABS: 0 10*3/uL (ref 0.0–0.1)
BASOS PCT: 0 %
Eosinophils Absolute: 0.3 10*3/uL (ref 0.0–0.7)
Eosinophils Relative: 6 %
HEMATOCRIT: 37.7 % (ref 36.0–46.0)
HEMOGLOBIN: 12.1 g/dL (ref 12.0–15.0)
LYMPHS PCT: 32 %
Lymphs Abs: 1.6 10*3/uL (ref 0.7–4.0)
MCH: 29.9 pg (ref 26.0–34.0)
MCHC: 32.1 g/dL (ref 30.0–36.0)
MCV: 93.1 fL (ref 78.0–100.0)
Monocytes Absolute: 0.6 10*3/uL (ref 0.1–1.0)
Monocytes Relative: 12 %
NEUTROS ABS: 2.5 10*3/uL (ref 1.7–7.7)
NEUTROS PCT: 50 %
Platelets: 221 10*3/uL (ref 150–400)
RBC: 4.05 MIL/uL (ref 3.87–5.11)
RDW: 14.1 % (ref 11.5–15.5)
WBC: 4.9 10*3/uL (ref 4.0–10.5)

## 2015-01-11 LAB — COMPREHENSIVE METABOLIC PANEL
ALBUMIN: 3.9 g/dL (ref 3.5–5.0)
ALT: 16 U/L (ref 14–54)
AST: 27 U/L (ref 15–41)
Alkaline Phosphatase: 118 U/L (ref 38–126)
Anion gap: 6 (ref 5–15)
BILIRUBIN TOTAL: 0.1 mg/dL — AB (ref 0.3–1.2)
BUN: 19 mg/dL (ref 6–20)
CHLORIDE: 108 mmol/L (ref 101–111)
CO2: 28 mmol/L (ref 22–32)
CREATININE: 0.89 mg/dL (ref 0.44–1.00)
Calcium: 8.7 mg/dL — ABNORMAL LOW (ref 8.9–10.3)
GFR calc Af Amer: >60 mL/min (ref >60)
GFR, EST NON AFRICAN AMERICAN: 58 mL/min — AB (ref >60)
GLUCOSE: 105 mg/dL — AB (ref 65–99)
Potassium: 3.7 mmol/L (ref 3.5–5.1)
Sodium: 142 mmol/L (ref 135–145)
TOTAL PROTEIN: 6.6 g/dL (ref 6.5–8.1)

## 2015-01-11 LAB — URINALYSIS, ROUTINE W REFLEX MICROSCOPIC
Bilirubin Urine: NEGATIVE
GLUCOSE, UA: NEGATIVE mg/dL
HGB URINE DIPSTICK: NEGATIVE
Ketones, ur: NEGATIVE mg/dL
LEUKOCYTES UA: NEGATIVE
Nitrite: NEGATIVE
PH: 6.5 (ref 5.0–8.0)
PROTEIN: NEGATIVE mg/dL
Specific Gravity, Urine: 1.018 (ref 1.005–1.030)

## 2015-01-11 LAB — LIPASE, BLOOD: LIPASE: 23 U/L (ref 11–51)

## 2015-01-11 LAB — I-STAT CG4 LACTIC ACID, ED
LACTIC ACID, VENOUS: 1.34 mmol/L (ref 0.5–2.0)
LACTIC ACID, VENOUS: 0.45 mmol/L — AB (ref 0.5–2.0)

## 2015-01-11 LAB — I-STAT TROPONIN, ED: Troponin i, poc: 0 ng/mL (ref 0.00–0.08)

## 2015-01-11 MED ORDER — QUETIAPINE FUMARATE ER 50 MG PO TB24
150.0000 mg | ORAL_TABLET | Freq: Every day | ORAL | Status: DC
  Filled 2015-01-11: qty 3

## 2015-01-11 MED ORDER — DORZOLAMIDE HCL-TIMOLOL MAL 2-0.5 % OP SOLN
1.0000 [drp] | Freq: Two times a day (BID) | OPHTHALMIC | Status: DC
  Administered 2015-01-11 – 2015-01-14 (×6): 1 [drp] via OPHTHALMIC
  Filled 2015-01-11: qty 10

## 2015-01-11 MED ORDER — LABETALOL HCL 5 MG/ML IV SOLN
5.0000 mg | Freq: Once | INTRAVENOUS | Status: AC
  Administered 2015-01-11: 5 mg via INTRAVENOUS
  Filled 2015-01-11: qty 4

## 2015-01-11 MED ORDER — ONDANSETRON HCL 4 MG/2ML IJ SOLN
4.0000 mg | Freq: Once | INTRAMUSCULAR | Status: AC
  Administered 2015-01-11: 4 mg via INTRAVENOUS
  Filled 2015-01-11: qty 2

## 2015-01-11 MED ORDER — LORAZEPAM 2 MG/ML IJ SOLN
1.0000 mg | INTRAMUSCULAR | Status: DC | PRN
  Administered 2015-01-12 – 2015-01-13 (×4): 1 mg via INTRAVENOUS
  Filled 2015-01-11 (×4): qty 1

## 2015-01-11 MED ORDER — SODIUM CHLORIDE 0.9 % IV BOLUS (SEPSIS)
500.0000 mL | Freq: Once | INTRAVENOUS | Status: AC
  Administered 2015-01-11: 500 mL via INTRAVENOUS

## 2015-01-11 MED ORDER — IOHEXOL 300 MG/ML  SOLN
50.0000 mL | Freq: Once | INTRAMUSCULAR | Status: AC | PRN
Start: 2015-01-11 — End: 2015-01-11
  Administered 2015-01-11: 50 mL via ORAL

## 2015-01-11 MED ORDER — IOHEXOL 300 MG/ML  SOLN
100.0000 mL | Freq: Once | INTRAMUSCULAR | Status: AC | PRN
  Administered 2015-01-11: 100 mL via INTRAVENOUS

## 2015-01-11 MED ORDER — LATANOPROST 0.005 % OP SOLN
1.0000 [drp] | Freq: Every day | OPHTHALMIC | Status: DC
  Administered 2015-01-11 – 2015-01-13 (×3): 1 [drp] via OPHTHALMIC
  Filled 2015-01-11: qty 2.5

## 2015-01-11 MED ORDER — POTASSIUM CHLORIDE IN NACL 20-0.9 MEQ/L-% IV SOLN
INTRAVENOUS | Status: DC
Start: 2015-01-11 — End: 2015-01-14
  Administered 2015-01-11 – 2015-01-14 (×3): via INTRAVENOUS
  Filled 2015-01-11 (×5): qty 1000

## 2015-01-11 MED ORDER — PANTOPRAZOLE SODIUM 40 MG IV SOLR
40.0000 mg | INTRAVENOUS | Status: DC
  Administered 2015-01-11 – 2015-01-13 (×3): 40 mg via INTRAVENOUS
  Filled 2015-01-11 (×4): qty 40

## 2015-01-11 MED ORDER — FENTANYL CITRATE (PF) 100 MCG/2ML IJ SOLN
50.0000 ug | Freq: Once | INTRAMUSCULAR | Status: AC
  Administered 2015-01-11: 50 ug via INTRAVENOUS
  Filled 2015-01-11: qty 2

## 2015-01-11 MED ORDER — TRAVOPROST 0.004 % OP SOLN: 1.0000 [drp] | Freq: Every day | OPHTHALMIC | Status: DC

## 2015-01-11 MED ORDER — ONDANSETRON HCL 4 MG/2ML IJ SOLN: 4.0000 mg | Freq: Four times a day (QID) | INTRAMUSCULAR | Status: DC | PRN

## 2015-01-11 MED ORDER — HYDROMORPHONE HCL 1 MG/ML IJ SOLN
1.0000 mg | INTRAMUSCULAR | Status: DC | PRN
  Administered 2015-01-12 – 2015-01-14 (×4): 1 mg via INTRAVENOUS
  Filled 2015-01-11 (×4): qty 1

## 2015-01-11 NOTE — ED Provider Notes (Signed)
5:30 PM Assumed care from Dr. Hillard Danker, please see their note for full history, physical and decision making until this point. In brief this is a 81 y.o. year old female who presented to the ED tonight with Emesis     Intermittent n/v. Awaiting esophagram to rule out perforation. Then PO challenge.   While getting esophagram patient had what seemed to be an aspiration event. Was only able to get 10 mL a contrast before this. Radiologist read that she had some retained particles in her distal esophagus and recommended gastroenterology consult. I discussed the case with gastroenterologist on-call with [facility] who recommended admission as the patient likely needed EGD so patient was admitted to hospitalist service for further evaluation and management..  Discharge instructions, including strict return precautions for new or worsening symptoms, given. Patient and/or family verbalized understanding and agreement with the plan as described.   Labs, studies and imaging reviewed by myself and considered in medical decision making if ordered. Imaging interpreted by radiology.  Labs Reviewed  COMPREHENSIVE METABOLIC PANEL - Abnormal; Notable for the following:    Glucose, Bld 105 (*)    Calcium 8.7 (*)    Total Bilirubin 0.1 (*)    GFR calc non Af Amer 58 (*)    All other components within normal limits  I-STAT CG4 LACTIC ACID, ED - Abnormal; Notable for the following:    Lactic Acid, Venous 0.45 (*)    All other components within normal limits  CBC WITH DIFFERENTIAL/PLATELET  LIPASE, BLOOD  URINALYSIS, ROUTINE W REFLEX MICROSCOPIC (NOT AT [facility])  I-STAT TROPOININ, ED  I-STAT CG4 LACTIC ACID, ED  I-STAT TROPOININ, ED    CT Abdomen Pelvis W Contrast  Final Result    DG Esophagus W/Water Sol CM    (Results Pending)    No Follow-up on file.   Merrily Pew, MD 01/11/15 2352

## 2015-01-11 NOTE — H&P (Signed)
[name] History and Physical  Taylor Roth [id number] DOB: 01/04/1929 DOA: 01/11/2015  Referring physician: Merrily Pew, MD PCP: No primary care provider on file.   Chief Complaint: Vomiting History is provided by the patient's daughter.  HPI: Taylor Roth is a 81 y.o. female with a past medical history of osteoporosis, depression, anxiety, COPD, GERD, migraines headaches, chronic kidney disease, chronic mental illness who was brought to the [facility] emergency department from [facility] due to complaints of "gagging and spitting up" mucus. She had a similar regurgitation episode that happened about 2 weeks ago that resolved on its own, but she states that she has been having this problem for about 2 years or so. She denies abdominal pain, nausea, emesis, diarrhea, melena or hematochezia. Per daughter, the patient gets frequent constipation.  Workup in the ER reveals a dilated distal esophagus on CT scan. She is being admitted to the service for medical management and GI consultation.  Review of Systems:  History provided by the patient's daughter.  Past Medical History  Diagnosis Date  . Osteoporosis   . Depression   . Anxiety   . COPD (chronic obstructive pulmonary disease) ([place])   . GERD (gastroesophageal reflux disease)   . Abuse     benzos/narcotics  . Migraines   . Scoliosis   . Insomnia, unspecified   . Vitamin B deficiency   . Weight loss   . Chronic mental illness   . Arterial tortuosity (aorta) 12/11/2012  . Chronic kidney disease, stage IV (severe) ([place]) 12/11/2012  . Substance abuse     Benzos and Narcotics, she does not get any of those medicines from [facility], we have empahtically told her that.   . Vertigo   . Colitis    Past Surgical History  Procedure Laterality Date  . Cholecystectomy    . Partial hysterectomy    . Abdominal hysterectomy    . Cataract extraction     Social History:  reports that she has quit  smoking. Her smoking use included Cigarettes. She smoked 0.50 packs per day. She has never used smokeless tobacco. She reports that she does not drink alcohol or use illicit drugs.  Allergies  Allergen Reactions  . Macrobid [Nitrofurantoin Macrocrystal] Rash  . Azithromycin Other (See Comments)    unknown  . Doxycycline Other (See Comments)    unknown  . Escitalopram Oxalate Other (See Comments)    unknown  . Fioricet [Butalbital-Apap-Caffeine] Other (See Comments)    Drunk.  . Latex Other (See Comments)    unknown  . Levofloxacin Other (See Comments)    unknown  . Nsaids Other (See Comments)    Allergic reaction not listed on MAR.  Marland Kitchen Oxycodone Other (See Comments)    reaction to synthetic codeine  . Restasis [Cyclosporine] Other (See Comments)    unknown  . Sulfa Antibiotics Other (See Comments)    Reaction unknown  . Tylenol [Acetaminophen] Other (See Comments)    jaundice  . Biaxin [Clarithromycin] Rash    With burning sensation    Family History  Problem Relation Age of Onset  . Stroke Mother   . Diabetes Mother   . Cancer Father 44    esophageal  . Heart disease Mother   . Hearing loss Mother     Prior to Admission medications   Medication Sig Start Date End Date Taking? Authorizing Provider  acetaminophen (TYLENOL) 325 MG tablet Take 650 mg by mouth every 6 (six) hours as needed for  headache.   Yes Historical Provider, MD  Acetic Acid (DOUCHE VINEGAR/WATER VA) Place 1 each around the anus once a week. To prevent UTI   Yes Historical Provider, MD  aspirin 81 MG chewable tablet Chew 1 tablet (81 mg total) by mouth daily. 12/12/12  Yes [name], MD  Cholecalciferol (VITAMIN D) 1000 UNITS capsule Take 1,000 Units by mouth daily.   Yes Historical Provider, MD  clorazepate (TRANXENE) 7.5 MG tablet Take 7.5 mg by mouth 2 (two) times daily.  12/23/12  Yes Estill Dooms, MD  Cranberry 425 MG CAPS Take 1 capsule by mouth 2 (two) times daily.   Yes Historical  Provider, MD  diclofenac (FLECTOR) 1.3 % PTCH Place 1 patch onto the skin every 12 (twelve) hours as needed (For localized pain.).    Yes Historical Provider, MD  divalproex (DEPAKOTE) 250 MG DR tablet Take 250 mg by mouth daily.   Yes Historical Provider, MD  docusate sodium (COLACE) 100 MG capsule Take 100 mg by mouth 2 (two) times daily.   Yes Historical Provider, MD  dorzolamide-timolol (COSOPT) 22.3-6.8 MG/ML ophthalmic solution Place 1 drop into both eyes 2 (two) times daily.   Yes Historical Provider, MD  DULoxetine (CYMBALTA) 30 MG capsule Take 30 mg by mouth daily.   Yes Historical Provider, MD  fluconazole (DIFLUCAN) 100 MG tablet Take 100 mg by mouth daily.   Yes Historical Provider, MD  fludrocortisone (FLORINEF) 0.1 MG tablet Take 0.1 mg by mouth every morning.   Yes Historical Provider, MD  fluticasone (FLOVENT HFA) 110 MCG/ACT inhaler Inhale 1 puff into the lungs 2 (two) times daily as needed (shortness of breath.).   Yes Historical Provider, MD  FLUZONE HIGH-DOSE 0.5 ML SUSY ADM 0.5ML IM UTD 11/15/14  Yes Historical Provider, MD  Ginkgo Biloba 120 MG CAPS Take 120 mg by mouth 2 (two) times daily.    Yes Historical Provider, MD  HYDROcodone-acetaminophen (NORCO) 10-325 MG per tablet Take 1 tablet by mouth every 6 (six) hours as needed (pain). Patient taking differently: Take 1 tablet by mouth every 6 (six) hours as needed. Scheduled at 6am, 12 noon, and 6pm. And may take 1 tablet at 12 midnight as needed for pain 04/02/14  Yes Theodis Blaze, MD  hydrocortisone (ANUSOL-HC) 2.5 % rectal cream Place 1 application rectally 2 (two) times daily as needed for hemorrhoids or itching.   Yes Historical Provider, MD  LORazepam (ATIVAN) 0.5 MG tablet Take 0.25-0.5 mg by mouth at bedtime. Take half a tablet nightly at bedtime. Take a full tablet twice daily as needed for anxiety   Yes Historical Provider, MD  meclizine (ANTIVERT) 25 MG tablet Take 25 mg by mouth 3 (three) times daily as needed for  dizziness. Do not combine with Hydroxyzine or any other antihistamine. 11/12/13  Yes Thao P Le, DO  Melatonin 1 MG TABS Take 1 mg by mouth at bedtime.    Yes Historical Provider, MD  mineral oil-hydrophilic petrolatum (AQUAPHOR) ointment Apply 1 application topically daily.   Yes Historical Provider, MD  mirtazapine (REMERON) 45 MG tablet Take 45 mg by mouth at bedtime.   Yes Historical Provider, MD  nitroGLYCERIN (NITROSTAT) 0.4 MG SL tablet Place 0.4 mg under the tongue every 5 (five) minutes as needed for chest pain. May use 3 times   Yes Historical Provider, MD  nystatin (MYCOSTATIN) 100000 UNIT/ML suspension Take 5 mLs by mouth 3 (three) times daily as needed.   Yes Historical Provider, MD  ondansetron [facility])  4 MG tablet Take 1 tablet (4 mg total) by mouth every 6 (six) hours. 12/25/14  Yes Hanna Patel-Mills, PA-C  polyethylene glycol (MIRALAX / GLYCOLAX) packet Take 17 g by mouth daily.   Yes Historical Provider, MD  PRESCRIPTION MEDICATION Take 1 Container by mouth 2 (two) times daily between meals. Med Pass   Yes Historical Provider, MD  QUEtiapine Fumarate (SEROQUEL XR) 150 MG 24 hr tablet Take 150 mg by mouth daily at 8 pm.   Yes Historical Provider, MD  ranitidine (ZANTAC) 150 MG tablet Take 150 mg by mouth 2 (two) times daily as needed for heartburn.   Yes Historical Provider, MD  Sennosides (SENNA LAX PO) Take 2 tablets by mouth 2 (two) times daily. Take 2 tablets in the morning and take 2 tablets at bedtime.   Yes Historical Provider, MD  travoprost, benzalkonium, (TRAVATAN) 0.004 % ophthalmic solution Place 1 drop into both eyes at bedtime.   Yes Historical Provider, MD  triamcinolone (NASACORT ALLERGY 24HR) 55 MCG/ACT AERO nasal inhaler Place 2 sprays into the nose daily as needed (allergies).   Yes Historical Provider, MD  triamcinolone cream (KENALOG) 0.1 % Apply 1 application topically 3 (three) times daily as needed (worsening symtoms then resume as needed).   Yes Historical  Provider, MD  triazolam (HALCION) 0.25 MG tablet Take 0.25 mg by mouth at bedtime.   Yes Historical Provider, MD  cephALEXin (KEFLEX) 500 MG capsule Take 1 capsule (500 mg total) by mouth 4 (four) times daily. 12/25/14   Ottie Glazier, PA-C   Physical Exam: Filed Vitals:   01/11/15 1945 01/11/15 1949 01/11/15 2000 01/11/15 2030  BP: 181/103  178/133 185/91  Pulse:    72  Temp:  97.9 F (36.6 C)  98.5 F (36.9 C)  TempSrc:  Oral  Oral  Resp: 27  24 22   SpO2:    95%    Wt Readings from Last 3 Encounters:  03/30/14 48 kg (105 lb 13.1 oz)  03/06/14 50.349 kg (111 lb)  11/09/13 45.813 kg (101 lb)    General:  Appears malnourished, but calm and comfortable Eyes: PERRL, normal lids, irises & conjunctiva ENT: grossly normal hearing, lips and mucosa are moist. Neck: no LAD, masses or thyromegaly Cardiovascular: RRR, no m/r/g. No LE edema. Telemetry: SR, no arrhythmias  Respiratory: CTA bilaterally, no w/r/r. Normal respiratory effort. Abdomen: Bowel sounds +, soft, ntnd Skin: no rash or induration seen on limited exam Musculoskeletal: grossly normal tone BUE/BLE Psychiatric: grossly normal mood and affect, speech fluent and appropriate Neurologic: grossly non-focal.          Labs on Admission:  Basic Metabolic Panel:  Recent Labs Lab 01/11/15 1430  NA 142  K 3.7  CL 108  CO2 28  GLUCOSE 105*  BUN 19  CREATININE 0.89  CALCIUM 8.7*   Liver Function Tests:  Recent Labs Lab 01/11/15 1430  AST 27  ALT 16  ALKPHOS 118  BILITOT 0.1*  PROT 6.6  ALBUMIN 3.9    Recent Labs Lab 01/11/15 1430  LIPASE 23   CBC:  Recent Labs Lab 01/11/15 1430  WBC 4.9  NEUTROABS 2.5  HGB 12.1  HCT 37.7  MCV 93.1  PLT 221    Radiological Exams on Admission: Ct Abdomen Pelvis W Contrast  01/11/2015  CLINICAL DATA:  Nausea and vomiting.  Low abdominal cramping EXAM: CT ABDOMEN AND PELVIS WITH CONTRAST TECHNIQUE: Multidetector CT imaging of the abdomen and pelvis was  performed using the standard protocol following bolus  administration of intravenous contrast. CONTRAST:  157mL OMNIPAQUE IOHEXOL 300 MG/ML  SOLN COMPARISON:  12/09/2012 FINDINGS: Lower chest and abdominal wall: The distal esophagus is distended with semi-solid material. Best visualized on coronal reformats there is contained appearing outpouching from the left aspect of the distal esophagus extending posterior. No surrounding fat edema or inflammatory enhancement. Small left pleural effusion and lower lobe atelectasis. Hepatobiliary: No focal liver abnormality.Chronic intrahepatic and extrahepatic bile duct enlargement post cholecystectomy. No evidence of calcified choledocholithiasis. Noted liver function tests. Pancreas: Unremarkable. Spleen: Unremarkable. Adrenals/Urinary Tract: Negative adrenals. No hydronephrosis or stone. Unremarkable bladder. Reproductive:Hysterectomy. Stable 2 cm right adnexal cyst, considered benign given long interval and size. Stomach/Bowel: Prominent enhancement at the gastric cardia without focal masslike appearance. There could be stricture given distal esophagus appearance. Large volume colonic stool without small bowel or colonic obstruction. No appendicitis. Vascular/Lymphatic: No acute vascular abnormality. No mass or adenopathy. Peritoneal: No ascites or pneumoperitoneum. Musculoskeletal: Lumbar levoscoliosis with multilevel advanced disc and facet degeneration. These results were called by telephone at the time of interpretation on 01/11/2015 at 5:09 pm to Dr. Brantley Stage , who verbally acknowledged these results. IMPRESSION: 1. Distended lower esophagus which could be from gastroesophageal reflux/vomiting or distal esophageal stricture. Gas around the lower esophagus favors epiphrenic diverticulum over extraluminal gas, but was not seen in 2014. If esophageal rupture is a clinical possibility, esophagram could be obtained. 2. No definitive explanation for lower abdominal pain. 3.  Small left pleural effusion. 4. Large stool volume. Electronically Signed   By: Monte Fantasia M.D.   On: 01/11/2015 17:12   Dg Esophagus W/water Sol Cm  01/11/2015  CLINICAL DATA:  81 year old female with nausea. Question obstruction versus esophageal leak. EXAM: ESOPHOGRAM/BARIUM SWALLOW TECHNIQUE: Single contrast examination was performed using 10 cc water-soluble. FLUOROSCOPY TIME:  If the device does not provide the exposure index: Fluoroscopy Time:  2 minutes and 31 seconds. Number of Acquired Images: 21 images with combination of last image hold and spot films. COMPARISON:  CT earlier in the day. FINDINGS: The patient was only able to ingest 10 cc of contrast. This revealed prominent retained contents within the esophagus with smooth narrowing distal esophagus above what may be a small hiatal hernia. Contrast did not traverse beyond this region with change of patient position and delayed imaging. This suggest obstructing lesion. No leak identified although contrast did not traverse beyond the hiatus. Results were discussed with Dr. Dolly Rias. Patient is to be kept with head elevated 25 degrees for the next few hours. GI consultation may be considered. IMPRESSION: Prominent retained contents within the esophagus with smooth narrowing distal esophagus above what may be a small hiatal hernia. Contrast did not traverse beyond this region suggesting obstructing lesion. No leak identified although contrast did not traverse beyond the hiatus. Results were discussed with Dr. Dolly Rias. Patient is to be kept with head elevated 25 degrees for the next few hours. GI consultation may be considered. Electronically Signed   By: Genia Del M.D.   On: 01/11/2015 18:28    EKG: Independently reviewed. Vent. rate 73 BPM PR interval 147 ms QRS duration 96 ms QT/QTc 437/482 ms P-R-T axes 56 -39 26 Sinus rhythm Probable left atrial enlargement RSR' in V1 or V2, right VCD or RVH Inferior infarct, old No  significant change since last tracing  Assessment/Plan Principal Problem:   Achalasia of esophagus   Dysphagia Admit for observation. Keep the patient nothing by mouth. Substitute essential oral meds for IV formulation or  similar. Antiemetics and analgesics as needed IV. Continue workup per GI.   Active Problems:   Anxiety Ativan IV push as needed while the patient is nothing by mouth.    COPD (chronic obstructive pulmonary disease) ([place]) As symptomatic now. Bronchodilators as needed, if the patient develops symptoms.    GERD (gastroesophageal reflux disease) Continue proton pump inhibitor.    Glaucoma Continue current eyedrops treatment.      Dr. Laurence Spates from the gastroenterology service is following the patient.  Code Status: Full code. DVT Prophylaxis: SCDs. Family Communication:          Taylor Roth Daughter [phone]   Taylor Roth Daughter [phone]  [phone]  Disposition Plan: Place in observation and continue GI workup.  Time spent: Over 70 minutes were spent during the process of this admission.  Reubin [place] [name] Pager [phone].

## 2015-01-11 NOTE — ED Notes (Signed)
Per EMS, pt from [facility].  Pt had same episode x 2 weeks ago.  Pt is "gagging" and spitting up thick mucous.  Denies nausea.  Denies congestion.  Vitals: 156/94, hr 80, resp 16, 97% ra

## 2015-01-11 NOTE — ED Notes (Signed)
Bed: [facility] Expected date:  Expected time:  Means of arrival:  Comments: EMS- 81yo F, "spitting up thick mucus"

## 2015-01-11 NOTE — ED Notes (Signed)
Per X-Ray Tech, Dr. Irish Elders states pt should not lay down more than 25 degrees

## 2015-01-11 NOTE — Consult Note (Signed)
[facility] CONSULT Reason for consult:regurgitation of food  Referring Physician: ER  Unassigned.   Taylor Roth is an 81 y.o. female.  HPI: Brief note the dragon system nonfunctional. 81 yo with 2 year history of regurgitating L+S  Saw [facility] years ago no EGD for 20 +years, has been getting worse the past 2 months. Regurgitates undigested food and liquid but in past has gone down eventually. CT shows dilated esophagus ? Of air near GEJ, water soluble swallow shows dilated esophgus with food material pt regurgitating no pain. No perforation. History very poor.  Past Medical History  Diagnosis Date  . Osteoporosis   . Depression   . Anxiety   . COPD (chronic obstructive pulmonary disease) ([place])   . GERD (gastroesophageal reflux disease)   . Abuse     benzos/narcotics  . Migraines   . Scoliosis   . Insomnia, unspecified   . Vitamin B deficiency   . Weight loss   . Chronic mental illness   . Arterial tortuosity (aorta) 12/11/2012  . Chronic kidney disease, stage IV (severe) ([place]) 12/11/2012  . Substance abuse     Benzos and Narcotics, she does not get any of those medicines from [facility], we have empahtically told her that.   . Vertigo   . Colitis     Past Surgical History  Procedure Laterality Date  . Cholecystectomy    . Partial hysterectomy    . Abdominal hysterectomy    . Cataract extraction      Family History  Problem Relation Age of Onset  . Stroke Mother   . Diabetes Mother   . Cancer Father 28    esophageal  . Heart disease Mother   . Hearing loss Mother     Social History:  reports that she has quit smoking. Her smoking use included Cigarettes. She smoked 0.50 packs per day. She has never used smokeless tobacco. She reports that she does not drink alcohol or use illicit drugs.  Allergies:  Allergies  Allergen Reactions  . Macrobid [Nitrofurantoin Macrocrystal] Rash  . Azithromycin Other (See Comments)    unknown  . Doxycycline Other (See  Comments)    unknown  . Escitalopram Oxalate Other (See Comments)    unknown  . Fioricet [Butalbital-Apap-Caffeine] Other (See Comments)    Drunk.  . Latex Other (See Comments)    unknown  . Levofloxacin Other (See Comments)    unknown  . Nsaids Other (See Comments)    Allergic reaction not listed on MAR.  Marland Kitchen Oxycodone Other (See Comments)    reaction to synthetic codeine  . Restasis [Cyclosporine] Other (See Comments)    unknown  . Sulfa Antibiotics Other (See Comments)    Reaction unknown  . Tylenol [Acetaminophen] Other (See Comments)    jaundice  . Biaxin [Clarithromycin] Rash    With burning sensation    Medications; Prior to Admission medications   Medication Sig Start Date End Date Taking? Authorizing Provider  acetaminophen (TYLENOL) 325 MG tablet Take 650 mg by mouth every 6 (six) hours as needed for headache.   Yes Historical Provider, MD  Acetic Acid (DOUCHE VINEGAR/WATER VA) Place 1 each around the anus once a week. To prevent UTI   Yes Historical Provider, MD  aspirin 81 MG chewable tablet Chew 1 tablet (81 mg total) by mouth daily. 12/12/12  Yes [name], MD  Cholecalciferol (VITAMIN D) 1000 UNITS capsule Take 1,000 Units by mouth daily.   Yes Historical Provider, MD  clorazepate (TRANXENE)  7.5 MG tablet Take 7.5 mg by mouth 2 (two) times daily.  12/23/12  Yes Estill Dooms, MD  Cranberry 425 MG CAPS Take 1 capsule by mouth 2 (two) times daily.   Yes Historical Provider, MD  diclofenac (FLECTOR) 1.3 % PTCH Place 1 patch onto the skin every 12 (twelve) hours as needed (For localized pain.).    Yes Historical Provider, MD  divalproex (DEPAKOTE) 250 MG DR tablet Take 250 mg by mouth daily.   Yes Historical Provider, MD  docusate sodium (COLACE) 100 MG capsule Take 100 mg by mouth 2 (two) times daily.   Yes Historical Provider, MD  dorzolamide-timolol (COSOPT) 22.3-6.8 MG/ML ophthalmic solution Place 1 drop into both eyes 2 (two) times daily.   Yes Historical  Provider, MD  DULoxetine (CYMBALTA) 30 MG capsule Take 30 mg by mouth daily.   Yes Historical Provider, MD  fluconazole (DIFLUCAN) 100 MG tablet Take 100 mg by mouth daily.   Yes Historical Provider, MD  fludrocortisone (FLORINEF) 0.1 MG tablet Take 0.1 mg by mouth every morning.   Yes Historical Provider, MD  fluticasone (FLOVENT HFA) 110 MCG/ACT inhaler Inhale 1 puff into the lungs 2 (two) times daily as needed (shortness of breath.).   Yes Historical Provider, MD  FLUZONE HIGH-DOSE 0.5 ML SUSY ADM 0.5ML IM UTD 11/15/14  Yes Historical Provider, MD  Ginkgo Biloba 120 MG CAPS Take 120 mg by mouth 2 (two) times daily.    Yes Historical Provider, MD  HYDROcodone-acetaminophen (NORCO) 10-325 MG per tablet Take 1 tablet by mouth every 6 (six) hours as needed (pain). Patient taking differently: Take 1 tablet by mouth every 6 (six) hours as needed. Scheduled at 6am, 12 noon, and 6pm. And may take 1 tablet at 12 midnight as needed for pain 04/02/14  Yes Theodis Blaze, MD  hydrocortisone (ANUSOL-HC) 2.5 % rectal cream Place 1 application rectally 2 (two) times daily as needed for hemorrhoids or itching.   Yes Historical Provider, MD  LORazepam (ATIVAN) 0.5 MG tablet Take 0.25-0.5 mg by mouth at bedtime. Take half a tablet nightly at bedtime. Take a full tablet twice daily as needed for anxiety   Yes Historical Provider, MD  meclizine (ANTIVERT) 25 MG tablet Take 25 mg by mouth 3 (three) times daily as needed for dizziness. Do not combine with Hydroxyzine or any other antihistamine. 11/12/13  Yes Thao P Le, DO  Melatonin 1 MG TABS Take 1 mg by mouth at bedtime.    Yes Historical Provider, MD  mineral oil-hydrophilic petrolatum (AQUAPHOR) ointment Apply 1 application topically daily.   Yes Historical Provider, MD  mirtazapine (REMERON) 45 MG tablet Take 45 mg by mouth at bedtime.   Yes Historical Provider, MD  nitroGLYCERIN (NITROSTAT) 0.4 MG SL tablet Place 0.4 mg under the tongue every 5 (five) minutes as  needed for chest pain. May use 3 times   Yes Historical Provider, MD  nystatin (MYCOSTATIN) 100000 UNIT/ML suspension Take 5 mLs by mouth 3 (three) times daily as needed.   Yes Historical Provider, MD  ondansetron (ZOFRAN) 4 MG tablet Take 1 tablet (4 mg total) by mouth every 6 (six) hours. 12/25/14  Yes Hanna Patel-Mills, PA-C  polyethylene glycol (MIRALAX / GLYCOLAX) packet Take 17 g by mouth daily.   Yes Historical Provider, MD  PRESCRIPTION MEDICATION Take 1 Container by mouth 2 (two) times daily between meals. Med Pass   Yes Historical Provider, MD  QUEtiapine Fumarate (SEROQUEL XR) 150 MG 24 hr tablet Take 150 mg  by mouth daily at 8 pm.   Yes Historical Provider, MD  ranitidine (ZANTAC) 150 MG tablet Take 150 mg by mouth 2 (two) times daily as needed for heartburn.   Yes Historical Provider, MD  Sennosides (SENNA LAX PO) Take 2 tablets by mouth 2 (two) times daily. Take 2 tablets in the morning and take 2 tablets at bedtime.   Yes Historical Provider, MD  travoprost, benzalkonium, (TRAVATAN) 0.004 % ophthalmic solution Place 1 drop into both eyes at bedtime.   Yes Historical Provider, MD  triamcinolone (NASACORT ALLERGY 24HR) 55 MCG/ACT AERO nasal inhaler Place 2 sprays into the nose daily as needed (allergies).   Yes Historical Provider, MD  triamcinolone cream (KENALOG) 0.1 % Apply 1 application topically 3 (three) times daily as needed (worsening symtoms then resume as needed).   Yes Historical Provider, MD  triazolam (HALCION) 0.25 MG tablet Take 0.25 mg by mouth at bedtime.   Yes Historical Provider, MD  cephALEXin (KEFLEX) 500 MG capsule Take 1 capsule (500 mg total) by mouth 4 (four) times daily. 12/25/14   Ottie Glazier, PA-C    PRN Meds  Results for orders placed or performed during the hospital encounter of 01/11/15 (from the past 48 hour(s))  CBC with Differential     Status: None   Collection Time: 01/11/15  2:30 PM  Result Value Ref Range   WBC 4.9 4.0 - 10.5 K/uL   RBC  4.05 3.87 - 5.11 MIL/uL   Hemoglobin 12.1 12.0 - 15.0 g/dL   HCT 37.7 36.0 - 46.0 %   MCV 93.1 78.0 - 100.0 fL   MCH 29.9 26.0 - 34.0 pg   MCHC 32.1 30.0 - 36.0 g/dL   RDW 14.1 11.5 - 15.5 %   Platelets 221 150 - 400 K/uL   Neutrophils Relative % 50 %   Neutro Abs 2.5 1.7 - 7.7 K/uL   Lymphocytes Relative 32 %   Lymphs Abs 1.6 0.7 - 4.0 K/uL   Monocytes Relative 12 %   Monocytes Absolute 0.6 0.1 - 1.0 K/uL   Eosinophils Relative 6 %   Eosinophils Absolute 0.3 0.0 - 0.7 K/uL   Basophils Relative 0 %   Basophils Absolute 0.0 0.0 - 0.1 K/uL  Comprehensive metabolic panel     Status: Abnormal   Collection Time: 01/11/15  2:30 PM  Result Value Ref Range   Sodium 142 135 - 145 mmol/L   Potassium 3.7 3.5 - 5.1 mmol/L   Chloride 108 101 - 111 mmol/L   CO2 28 22 - 32 mmol/L   Glucose, Bld 105 (H) 65 - 99 mg/dL   BUN 19 6 - 20 mg/dL   Creatinine, Ser 0.89 0.44 - 1.00 mg/dL   Calcium 8.7 (L) 8.9 - 10.3 mg/dL   Total Protein 6.6 6.5 - 8.1 g/dL   Albumin 3.9 3.5 - 5.0 g/dL   AST 27 15 - 41 U/L   ALT 16 14 - 54 U/L   Alkaline Phosphatase 118 38 - 126 U/L   Total Bilirubin 0.1 (L) 0.3 - 1.2 mg/dL   GFR calc non Af Amer 58 (L) >60 mL/min   GFR calc Af Amer >60 >60 mL/min    Comment: (NOTE) The eGFR has been calculated using the CKD EPI equation. This calculation has not been validated in all clinical situations. eGFR's persistently <60 mL/min signify possible Chronic Kidney Disease.    Anion gap 6 5 - 15  Lipase, blood     Status: None  Collection Time: 01/11/15  2:30 PM  Result Value Ref Range   Lipase 23 11 - 51 U/L  I-Stat Troponin, ED (not at [facility])     Status: None   Collection Time: 01/11/15  2:44 PM  Result Value Ref Range   Troponin i, poc 0.00 0.00 - 0.08 ng/mL   Comment 3            Comment: Due to the release kinetics of cTnI, a negative result within the first hours of the onset of symptoms does not rule out myocardial infarction with certainty. If myocardial  infarction is still suspected, repeat the test at appropriate intervals.   I-Stat CG4 Lactic Acid, ED     Status: None   Collection Time: 01/11/15  2:51 PM  Result Value Ref Range   Lactic Acid, Venous 1.34 0.5 - 2.0 mmol/L  Urinalysis, Routine w reflex microscopic (not at [facility])     Status: None   Collection Time: 01/11/15  4:13 PM  Result Value Ref Range   Color, Urine YELLOW YELLOW   APPearance CLEAR CLEAR   Specific Gravity, Urine 1.018 1.005 - 1.030   pH 6.5 5.0 - 8.0   Glucose, UA NEGATIVE NEGATIVE mg/dL   Hgb urine dipstick NEGATIVE NEGATIVE   Bilirubin Urine NEGATIVE NEGATIVE   Ketones, ur NEGATIVE NEGATIVE mg/dL   Protein, ur NEGATIVE NEGATIVE mg/dL   Nitrite NEGATIVE NEGATIVE   Leukocytes, UA NEGATIVE NEGATIVE    Comment: MICROSCOPIC NOT DONE ON URINES WITH NEGATIVE PROTEIN, BLOOD, LEUKOCYTES, NITRITE, OR GLUCOSE <1000 mg/dL.  I-Stat CG4 Lactic Acid, ED     Status: Abnormal   Collection Time: 01/11/15  5:16 PM  Result Value Ref Range   Lactic Acid, Venous 0.45 (L) 0.5 - 2.0 mmol/L    Ct Abdomen Pelvis W Contrast  01/11/2015  CLINICAL DATA:  Nausea and vomiting.  Low abdominal cramping EXAM: CT ABDOMEN AND PELVIS WITH CONTRAST TECHNIQUE: Multidetector CT imaging of the abdomen and pelvis was performed using the standard protocol following bolus administration of intravenous contrast. CONTRAST:  155m OMNIPAQUE IOHEXOL 300 MG/ML  SOLN COMPARISON:  12/09/2012 FINDINGS: Lower chest and abdominal wall: The distal esophagus is distended with semi-solid material. Best visualized on coronal reformats there is contained appearing outpouching from the left aspect of the distal esophagus extending posterior. No surrounding fat edema or inflammatory enhancement. Small left pleural effusion and lower lobe atelectasis. Hepatobiliary: No focal liver abnormality.Chronic intrahepatic and extrahepatic bile duct enlargement post cholecystectomy. No evidence of calcified choledocholithiasis. Noted  liver function tests. Pancreas: Unremarkable. Spleen: Unremarkable. Adrenals/Urinary Tract: Negative adrenals. No hydronephrosis or stone. Unremarkable bladder. Reproductive:Hysterectomy. Stable 2 cm right adnexal cyst, considered benign given long interval and size. Stomach/Bowel: Prominent enhancement at the gastric cardia without focal masslike appearance. There could be stricture given distal esophagus appearance. Large volume colonic stool without small bowel or colonic obstruction. No appendicitis. Vascular/Lymphatic: No acute vascular abnormality. No mass or adenopathy. Peritoneal: No ascites or pneumoperitoneum. Musculoskeletal: Lumbar levoscoliosis with multilevel advanced disc and facet degeneration. These results were called by telephone at the time of interpretation on 01/11/2015 at 5:09 pm to Dr. DBrantley Stage, who verbally acknowledged these results. IMPRESSION: 1. Distended lower esophagus which could be from gastroesophageal reflux/vomiting or distal esophageal stricture. Gas around the lower esophagus favors epiphrenic diverticulum over extraluminal gas, but was not seen in 2014. If esophageal rupture is a clinical possibility, esophagram could be obtained. 2. No definitive explanation for lower abdominal pain. 3. Small left pleural effusion.  4. Large stool volume. Electronically Signed   By: Monte Fantasia M.D.   On: 01/11/2015 17:12   Dg Esophagus W/water Sol Cm  01/11/2015  CLINICAL DATA:  81 year old female with nausea. Question obstruction versus esophageal leak. EXAM: ESOPHOGRAM/BARIUM SWALLOW TECHNIQUE: Single contrast examination was performed using 10 cc water-soluble. FLUOROSCOPY TIME:  If the device does not provide the exposure index: Fluoroscopy Time:  2 minutes and 31 seconds. Number of Acquired Images: 21 images with combination of last image hold and spot films. COMPARISON:  CT earlier in the day. FINDINGS: The patient was only able to ingest 10 cc of contrast. This  revealed prominent retained contents within the esophagus with smooth narrowing distal esophagus above what may be a small hiatal hernia. Contrast did not traverse beyond this region with change of patient position and delayed imaging. This suggest obstructing lesion. No leak identified although contrast did not traverse beyond the hiatus. Results were discussed with Dr. Dolly Rias. Patient is to be kept with head elevated 25 degrees for the next few hours. GI consultation may be considered. IMPRESSION: Prominent retained contents within the esophagus with smooth narrowing distal esophagus above what may be a small hiatal hernia. Contrast did not traverse beyond this region suggesting obstructing lesion. No leak identified although contrast did not traverse beyond the hiatus. Results were discussed with Dr. Dolly Rias. Patient is to be kept with head elevated 25 degrees for the next few hours. GI consultation may be considered. Electronically Signed   By: Genia Del M.D.   On: 01/11/2015 18:28               Blood pressure 181/103, pulse 79, temperature 97.9 F (36.6 C), temperature source Oral, resp. rate 27, SpO2 94 %.  Physical exam:   General--toothless WF no distress ENT--moist MM Neck--w/o masses Heart--RRR no M/G Lungs--clear Abdomen--nontender Psych--A+O gives poor history.   Assessment: 1. Dilated Esophagus/Retained liquid solid. This is a chronic problem that is getting worse ? Achalasia. Will eventually need EGD but need to clear the esophagus 2. Bipolar 3. COPD  Plan: 1. Would keep NPO and keep the [place] elevated. IV fluids 2. Will check a CXR in AM. If material passed from esophagus probable EGD r/o neoplasm, evaluate for achalasia. If still has material in the esophagus, ? NG in esophagus to suction to clear prior to EGD.   Maha Fischel JR,Clem Wisenbaker L 01/11/2015, 8:23 PM   Pager: [phone] If no answer or after hours call [phone]

## 2015-01-11 NOTE — ED Provider Notes (Signed)
CSN: [id number]     Arrival date & time 01/11/15  1331 History   First MD Initiated Contact with Patient 01/11/15 1345     Chief Complaint  Patient presents with  . Emesis     (Consider location/radiation/quality/duration/timing/severity/associated sxs/prior Treatment) HPI 81 year old female who presents with nausea and vomiting. History of COPD and CKD. Also a prior history of cholecystectomy and abdominal hysterectomy. She was brought in from her nursing facility with episode of nausea and vomiting after lunch today. Patient says that this is happened to her before in the past, once in the setting of a urinary tract infection, but was also told that she has a hiatal hernia which can cause this as well. States that this issue has been going one for one year.  Notes mild lower abdominal cramping. Denies diarrhea, constipation, and had a normal bowel movement this morning. Denies any abdominal distention, chest pain, difficulty breathing, headaches, vision changes, numbness or weakness, speech speech changes. Denies any recent fevers or chills, dysuria, or urinary frequency. Past Medical History  Diagnosis Date  . Osteoporosis   . Depression   . Anxiety   . COPD (chronic obstructive pulmonary disease) ([place])   . GERD (gastroesophageal reflux disease)   . Abuse     benzos/narcotics  . Migraines   . Scoliosis   . Insomnia, unspecified   . Vitamin B deficiency   . Weight loss   . Chronic mental illness   . Arterial tortuosity (aorta) 12/11/2012  . Chronic kidney disease, stage IV (severe) ([place]) 12/11/2012  . Substance abuse     Benzos and Narcotics, she does not get any of those medicines from [facility], we have empahtically told her that.   . Vertigo   . Colitis    Past Surgical History  Procedure Laterality Date  . Cholecystectomy    . Partial hysterectomy    . Abdominal hysterectomy    . Cataract extraction     Family History  Problem Relation Age of Onset  . Stroke Mother   .  Diabetes Mother   . Cancer Father 38    esophageal  . Heart disease Mother   . Hearing loss Mother    Social History  Substance Use Topics  . Smoking status: Former Smoker -- 0.50 packs/day    Types: Cigarettes  . Smokeless tobacco: Never Used  . Alcohol Use: No   OB History    No data available     Review of Systems 10/14 systems reviewed and are negative other than those stated in the HPI    Allergies  Macrobid; Azithromycin; Doxycycline; Escitalopram oxalate; Fioricet; Latex; Levofloxacin; Nsaids; Oxycodone; Restasis; Sulfa antibiotics; Tylenol; and Biaxin  Home Medications   Prior to Admission medications   Medication Sig Start Date End Date Taking? Authorizing Provider  acetaminophen (TYLENOL) 325 MG tablet Take 650 mg by mouth every 6 (six) hours as needed for headache.   Yes Historical Provider, MD  Acetic Acid (DOUCHE VINEGAR/WATER VA) Place 1 each around the anus once a week. To prevent UTI   Yes Historical Provider, MD  aspirin 81 MG chewable tablet Chew 1 tablet (81 mg total) by mouth daily. 12/12/12  Yes [name], MD  Cholecalciferol (VITAMIN D) 1000 UNITS capsule Take 1,000 Units by mouth daily.   Yes Historical Provider, MD  clorazepate (TRANXENE) 7.5 MG tablet Take 7.5 mg by mouth 2 (two) times daily.  12/23/12  Yes Estill Dooms, MD  Cranberry 425 MG CAPS Take 1 capsule  by mouth 2 (two) times daily.   Yes Historical Provider, MD  diclofenac (FLECTOR) 1.3 % PTCH Place 1 patch onto the skin every 12 (twelve) hours as needed (For localized pain.).    Yes Historical Provider, MD  divalproex (DEPAKOTE) 250 MG DR tablet Take 250 mg by mouth daily.   Yes Historical Provider, MD  docusate sodium (COLACE) 100 MG capsule Take 100 mg by mouth 2 (two) times daily.   Yes Historical Provider, MD  dorzolamide-timolol (COSOPT) 22.3-6.8 MG/ML ophthalmic solution Place 1 drop into both eyes 2 (two) times daily.   Yes Historical Provider, MD  DULoxetine (CYMBALTA) 30 MG  capsule Take 30 mg by mouth daily.   Yes Historical Provider, MD  fluconazole (DIFLUCAN) 100 MG tablet Take 100 mg by mouth daily.   Yes Historical Provider, MD  fludrocortisone (FLORINEF) 0.1 MG tablet Take 0.1 mg by mouth every morning.   Yes Historical Provider, MD  fluticasone (FLOVENT HFA) 110 MCG/ACT inhaler Inhale 1 puff into the lungs 2 (two) times daily as needed (shortness of breath.).   Yes Historical Provider, MD  FLUZONE HIGH-DOSE 0.5 ML SUSY ADM 0.5ML IM UTD 11/15/14  Yes Historical Provider, MD  Ginkgo Biloba 120 MG CAPS Take 120 mg by mouth 2 (two) times daily.    Yes Historical Provider, MD  HYDROcodone-acetaminophen (NORCO) 10-325 MG per tablet Take 1 tablet by mouth every 6 (six) hours as needed (pain). Patient taking differently: Take 1 tablet by mouth every 6 (six) hours as needed. Scheduled at 6am, 12 noon, and 6pm. And may take 1 tablet at 12 midnight as needed for pain 04/02/14  Yes Theodis Blaze, MD  hydrocortisone (ANUSOL-HC) 2.5 % rectal cream Place 1 application rectally 2 (two) times daily as needed for hemorrhoids or itching.   Yes Historical Provider, MD  LORazepam (ATIVAN) 0.5 MG tablet Take 0.25-0.5 mg by mouth at bedtime. Take half a tablet nightly at bedtime. Take a full tablet twice daily as needed for anxiety   Yes Historical Provider, MD  meclizine (ANTIVERT) 25 MG tablet Take 25 mg by mouth 3 (three) times daily as needed for dizziness. Do not combine with Hydroxyzine or any other antihistamine. 11/12/13  Yes Thao P Le, DO  Melatonin 1 MG TABS Take 1 mg by mouth at bedtime.    Yes Historical Provider, MD  mineral oil-hydrophilic petrolatum (AQUAPHOR) ointment Apply 1 application topically daily.   Yes Historical Provider, MD  mirtazapine (REMERON) 45 MG tablet Take 45 mg by mouth at bedtime.   Yes Historical Provider, MD  nitroGLYCERIN (NITROSTAT) 0.4 MG SL tablet Place 0.4 mg under the tongue every 5 (five) minutes as needed for chest pain. May use 3 times   Yes  Historical Provider, MD  nystatin (MYCOSTATIN) 100000 UNIT/ML suspension Take 5 mLs by mouth 3 (three) times daily as needed.   Yes Historical Provider, MD  ondansetron (ZOFRAN) 4 MG tablet Take 1 tablet (4 mg total) by mouth every 6 (six) hours. 12/25/14  Yes Hanna Patel-Mills, PA-C  polyethylene glycol (MIRALAX / GLYCOLAX) packet Take 17 g by mouth daily.   Yes Historical Provider, MD  PRESCRIPTION MEDICATION Take 1 Container by mouth 2 (two) times daily between meals. Med Pass   Yes Historical Provider, MD  QUEtiapine Fumarate (SEROQUEL XR) 150 MG 24 hr tablet Take 150 mg by mouth daily at 8 pm.   Yes Historical Provider, MD  ranitidine (ZANTAC) 150 MG tablet Take 150 mg by mouth 2 (two) times daily as  needed for heartburn.   Yes Historical Provider, MD  Sennosides (SENNA LAX PO) Take 2 tablets by mouth 2 (two) times daily. Take 2 tablets in the morning and take 2 tablets at bedtime.   Yes Historical Provider, MD  travoprost, benzalkonium, (TRAVATAN) 0.004 % ophthalmic solution Place 1 drop into both eyes at bedtime.   Yes Historical Provider, MD  triamcinolone (NASACORT ALLERGY 24HR) 55 MCG/ACT AERO nasal inhaler Place 2 sprays into the nose daily as needed (allergies).   Yes Historical Provider, MD  triamcinolone cream (KENALOG) 0.1 % Apply 1 application topically 3 (three) times daily as needed (worsening symtoms then resume as needed).   Yes Historical Provider, MD  triazolam (HALCION) 0.25 MG tablet Take 0.25 mg by mouth at bedtime.   Yes Historical Provider, MD  cephALEXin (KEFLEX) 500 MG capsule Take 1 capsule (500 mg total) by mouth 4 (four) times daily. 12/25/14   Hanna Patel-Mills, PA-C   BP 139/85 mmHg  Pulse 75  Temp(Src) 98.7 F (37.1 C) (Oral)  Resp 22  Ht 5\' 2"  (1.575 m)  Wt 114 lb 6.7 oz (51.9 kg)  BMI 20.92 kg/m2  SpO2 96% Physical Exam Physical Exam  Nursing note and vitals reviewed. Constitutional: Well developed, well nourished, non-toxic, and in no acute  distress Head: Normocephalic and atraumatic.  Mouth/Throat: Oropharynx is clear and moist.  Neck: Normal range of motion. Neck supple.  Cardiovascular: Normal rate and regular rhythm.   Pulmonary/Chest: Effort normal and breath sounds normal.  Abdominal: Soft. There is no tenderness. There is no rebound and no guarding.  Musculoskeletal: Normal range of motion.  Neurological: Alert, no facial droop, fluent speech, moves all extremities symmetrically, no dysmetria with finger to nose, EOMI, PERRL, palate moves symmetrically, sensation to light touch in tact throughout Skin: Skin is warm and dry.  Psychiatric: Cooperative   ED Course  Procedures (including critical care time) Labs Review Labs Reviewed  COMPREHENSIVE METABOLIC PANEL - Abnormal; Notable for the following:    Glucose, Bld 105 (*)    Calcium 8.7 (*)    Total Bilirubin 0.1 (*)    GFR calc non Af Amer 58 (*)    All other components within normal limits  I-STAT CG4 LACTIC ACID, ED - Abnormal; Notable for the following:    Lactic Acid, Venous 0.45 (*)    All other components within normal limits  CBC WITH DIFFERENTIAL/PLATELET  LIPASE, BLOOD  URINALYSIS, ROUTINE W REFLEX MICROSCOPIC (NOT AT [facility])  I-STAT TROPOININ, ED  I-STAT CG4 LACTIC ACID, ED  I-STAT TROPOININ, ED    Imaging Review Ct Abdomen Pelvis W Contrast  01/11/2015  CLINICAL DATA:  Nausea and vomiting.  Low abdominal cramping EXAM: CT ABDOMEN AND PELVIS WITH CONTRAST TECHNIQUE: Multidetector CT imaging of the abdomen and pelvis was performed using the standard protocol following bolus administration of intravenous contrast. CONTRAST:  181mL OMNIPAQUE IOHEXOL 300 MG/ML  SOLN COMPARISON:  12/09/2012 FINDINGS: Lower chest and abdominal wall: The distal esophagus is distended with semi-solid material. Best visualized on coronal reformats there is contained appearing outpouching from the left aspect of the distal esophagus extending posterior. No surrounding fat edema  or inflammatory enhancement. Small left pleural effusion and lower lobe atelectasis. Hepatobiliary: No focal liver abnormality.Chronic intrahepatic and extrahepatic bile duct enlargement post cholecystectomy. No evidence of calcified choledocholithiasis. Noted liver function tests. Pancreas: Unremarkable. Spleen: Unremarkable. Adrenals/Urinary Tract: Negative adrenals. No hydronephrosis or stone. Unremarkable bladder. Reproductive:Hysterectomy. Stable 2 cm right adnexal cyst, considered benign given long interval and size.  Stomach/Bowel: Prominent enhancement at the gastric cardia without focal masslike appearance. There could be stricture given distal esophagus appearance. Large volume colonic stool without small bowel or colonic obstruction. No appendicitis. Vascular/Lymphatic: No acute vascular abnormality. No mass or adenopathy. Peritoneal: No ascites or pneumoperitoneum. Musculoskeletal: Lumbar levoscoliosis with multilevel advanced disc and facet degeneration. These results were called by telephone at the time of interpretation on 01/11/2015 at 5:09 pm to Dr. Brantley Stage , who verbally acknowledged these results. IMPRESSION: 1. Distended lower esophagus which could be from gastroesophageal reflux/vomiting or distal esophageal stricture. Gas around the lower esophagus favors epiphrenic diverticulum over extraluminal gas, but was not seen in 2014. If esophageal rupture is a clinical possibility, esophagram could be obtained. 2. No definitive explanation for lower abdominal pain. 3. Small left pleural effusion. 4. Large stool volume. Electronically Signed   By: Monte Fantasia M.D.   On: 01/11/2015 17:12   Dg Esophagus W/water Sol Cm  01/11/2015  CLINICAL DATA:  81 year old female with nausea. Question obstruction versus esophageal leak. EXAM: ESOPHOGRAM/BARIUM SWALLOW TECHNIQUE: Single contrast examination was performed using 10 cc water-soluble. FLUOROSCOPY TIME:  If the device does not provide the  exposure index: Fluoroscopy Time:  2 minutes and 31 seconds. Number of Acquired Images: 21 images with combination of last image hold and spot films. COMPARISON:  CT earlier in the day. FINDINGS: The patient was only able to ingest 10 cc of contrast. This revealed prominent retained contents within the esophagus with smooth narrowing distal esophagus above what may be a small hiatal hernia. Contrast did not traverse beyond this region with change of patient position and delayed imaging. This suggest obstructing lesion. No leak identified although contrast did not traverse beyond the hiatus. Results were discussed with Dr. Dolly Rias. Patient is to be kept with head elevated 25 degrees for the next few hours. GI consultation may be considered. IMPRESSION: Prominent retained contents within the esophagus with smooth narrowing distal esophagus above what may be a small hiatal hernia. Contrast did not traverse beyond this region suggesting obstructing lesion. No leak identified although contrast did not traverse beyond the hiatus. Results were discussed with Dr. Dolly Rias. Patient is to be kept with head elevated 25 degrees for the next few hours. GI consultation may be considered. Electronically Signed   By: Genia Del M.D.   On: 01/11/2015 18:28   I have personally reviewed and evaluated these images and lab results as part of my medical decision-making.   EKG Interpretation   Date/Time:  Tuesday January 11 2015 14:36:38 EST Ventricular Rate:  73 PR Interval:  147 QRS Duration: 96 QT Interval:  437 QTC Calculation: 482 R Axis:   -39 Text Interpretation:  Sinus rhythm Probable left atrial enlargement RSR'  in V1 or V2, right VCD or RVH Inferior infarct, old No significant change  since last tracing Confirmed by Flynt Breeze MD, Kristina Bertone [phone]) on 01/11/2015 4:36:17  PM      MDM   Final diagnoses:  Non-intractable vomiting with nausea, vomiting of unspecified type  Vomiting    81 year old female who  presents with recurrent vomiting. On arrival, is in no acute distress with non-concerning vital signs. Abdomen soft and benign.  Unremarkable CBC, CMP, UA, and lipase. EKG not acutely ischemic and troponin negative, unlikely to be atypical presentation for ACS. No headache or neuro complaints or findings; vomiting felt less likely to be due to acute intracranial processes. Given reports of some abdominal pain, CT abd performed. Visualized and  discussed with radiology. Concern for esophageal diverticulum versus less likely esophageal perforation. Patient not complaining of severe abdominal pain, chest pain, and is hemodynamically stable. However given her age and recent episode of vomiting, esophagram was performed to rule out perforation. Results pending and signed out to oncoming provider.    Forde Dandy, MD 01/12/15 [phone]

## 2015-01-11 NOTE — Progress Notes (Signed)
Pt arrived from ED at 2030.  Admitting MD at bedside at 2035.  Awaiting further orders at this time.

## 2015-01-11 NOTE — Discharge Instructions (Signed)
Return without fail for worsening symptoms, including fever, worsening pain, vomiting and unable to keep down food/fluids, or any other symptoms concerning to you.   Nausea and Vomiting Nausea means you feel sick to your stomach. Throwing up (vomiting) is a reflex where stomach contents come out of your mouth. HOME CARE   Take medicine as told by your doctor.  Do not force yourself to eat. However, you do need to drink fluids.  If you feel like eating, eat a normal diet as told by your doctor.  Eat rice, wheat, potatoes, bread, lean meats, yogurt, fruits, and vegetables.  Avoid high-fat foods.  Drink enough fluids to keep your pee (urine) clear or pale yellow.  Ask your doctor how to replace body fluid losses (rehydrate). Signs of body fluid loss (dehydration) include:  Feeling very thirsty.  Dry lips and mouth.  Feeling dizzy.  Dark pee.  Peeing less than normal.  Feeling confused.  Fast breathing or heart rate. GET HELP RIGHT AWAY IF:   You have blood in your throw up.  You have black or bloody poop (stool).  You have a bad headache or stiff neck.  You feel confused.  You have bad belly (abdominal) pain.  You have chest pain or trouble breathing.  You do not pee at least once every 8 hours.  You have cold, clammy skin.  You keep throwing up after 24 to 48 hours.  You have a fever. MAKE SURE YOU:   Understand these instructions.  Will watch your condition.  Will get help right away if you are not doing well or get worse.   This information is not intended to replace advice given to you by your health care provider. Make sure you discuss any questions you have with your health care provider.   Document Released: 07/18/2007 Document Revised: 04/23/2011 Document Reviewed: 06/30/2010 Elsevier Interactive Patient Education [employer].

## 2015-01-11 NOTE — Progress Notes (Signed)
CSW met with patient at bedside. Daughter was present. Patient states that she comes from [facility] [place].  Per note, patient presents to [facility] due to spitting up thick mucus. Patient states that she had some peas for dinner. Patient states that she believed her dinner caused her to not feel well.  Patient informed CSW that she has been completing her own ADL's. Patient expressed dissatisfaction with her current facility. Patient states that she does not feel that they help her as much as they should. CSW provided the patient and daughter with [facility] information.  Willette Brace [phone] ED CSW 01/11/2015 6:52 PM

## 2015-01-12 ENCOUNTER — Observation Stay (HOSPITAL_COMMUNITY): Payer: Medicare Other

## 2015-01-12 DIAGNOSIS — R1310 Dysphagia, unspecified: Secondary | ICD-10-CM

## 2015-01-12 DIAGNOSIS — J449 Chronic obstructive pulmonary disease, unspecified: Secondary | ICD-10-CM

## 2015-01-12 DIAGNOSIS — H409 Unspecified glaucoma: Secondary | ICD-10-CM | POA: Diagnosis present

## 2015-01-12 DIAGNOSIS — E876 Hypokalemia: Secondary | ICD-10-CM | POA: Diagnosis present

## 2015-01-12 DIAGNOSIS — K449 Diaphragmatic hernia without obstruction or gangrene: Secondary | ICD-10-CM | POA: Diagnosis present

## 2015-01-12 DIAGNOSIS — R1110 Vomiting, unspecified: Secondary | ICD-10-CM | POA: Diagnosis present

## 2015-01-12 DIAGNOSIS — Z9049 Acquired absence of other specified parts of digestive tract: Secondary | ICD-10-CM | POA: Diagnosis not present

## 2015-01-12 DIAGNOSIS — K219 Gastro-esophageal reflux disease without esophagitis: Secondary | ICD-10-CM

## 2015-01-12 DIAGNOSIS — M810 Age-related osteoporosis without current pathological fracture: Secondary | ICD-10-CM | POA: Diagnosis present

## 2015-01-12 DIAGNOSIS — F419 Anxiety disorder, unspecified: Secondary | ICD-10-CM | POA: Diagnosis present

## 2015-01-12 DIAGNOSIS — Z6820 Body mass index (BMI) 20.0-20.9, adult: Secondary | ICD-10-CM | POA: Diagnosis not present

## 2015-01-12 DIAGNOSIS — Z87891 Personal history of nicotine dependence: Secondary | ICD-10-CM | POA: Diagnosis not present

## 2015-01-12 DIAGNOSIS — K222 Esophageal obstruction: Secondary | ICD-10-CM | POA: Diagnosis present

## 2015-01-12 DIAGNOSIS — R1319 Other dysphagia: Secondary | ICD-10-CM | POA: Diagnosis present

## 2015-01-12 DIAGNOSIS — K5900 Constipation, unspecified: Secondary | ICD-10-CM | POA: Diagnosis present

## 2015-01-12 DIAGNOSIS — Z7982 Long term (current) use of aspirin: Secondary | ICD-10-CM | POA: Diagnosis not present

## 2015-01-12 DIAGNOSIS — I129 Hypertensive chronic kidney disease with stage 1 through stage 4 chronic kidney disease, or unspecified chronic kidney disease: Secondary | ICD-10-CM | POA: Diagnosis present

## 2015-01-12 DIAGNOSIS — F329 Major depressive disorder, single episode, unspecified: Secondary | ICD-10-CM | POA: Diagnosis present

## 2015-01-12 DIAGNOSIS — Z823 Family history of stroke: Secondary | ICD-10-CM | POA: Diagnosis not present

## 2015-01-12 DIAGNOSIS — R4702 Dysphasia: Secondary | ICD-10-CM | POA: Diagnosis present

## 2015-01-12 DIAGNOSIS — N183 Chronic kidney disease, stage 3 (moderate): Secondary | ICD-10-CM | POA: Diagnosis present

## 2015-01-12 DIAGNOSIS — R112 Nausea with vomiting, unspecified: Secondary | ICD-10-CM | POA: Diagnosis not present

## 2015-01-12 DIAGNOSIS — Z8249 Family history of ischemic heart disease and other diseases of the circulatory system: Secondary | ICD-10-CM | POA: Diagnosis not present

## 2015-01-12 DIAGNOSIS — Z833 Family history of diabetes mellitus: Secondary | ICD-10-CM | POA: Diagnosis not present

## 2015-01-12 DIAGNOSIS — Z79899 Other long term (current) drug therapy: Secondary | ICD-10-CM | POA: Diagnosis not present

## 2015-01-12 DIAGNOSIS — K220 Achalasia of cardia: Secondary | ICD-10-CM | POA: Diagnosis present

## 2015-01-12 MED ORDER — QUETIAPINE FUMARATE ER 50 MG PO TB24
150.0000 mg | ORAL_TABLET | Freq: Every day | ORAL | Status: DC
  Administered 2015-01-12 – 2015-01-13 (×2): 150 mg via ORAL
  Filled 2015-01-12 (×3): qty 3

## 2015-01-12 MED ORDER — FLUDROCORTISONE ACETATE 0.1 MG PO TABS
0.1000 mg | ORAL_TABLET | Freq: Every morning | ORAL | Status: DC
  Administered 2015-01-14: 0.1 mg via ORAL
  Filled 2015-01-12 (×2): qty 1

## 2015-01-12 MED ORDER — BUDESONIDE 0.25 MG/2ML IN SUSP
0.2500 mg | Freq: Two times a day (BID) | RESPIRATORY_TRACT | Status: DC
  Administered 2015-01-12 – 2015-01-14 (×4): 0.25 mg via RESPIRATORY_TRACT
  Filled 2015-01-12 (×4): qty 2

## 2015-01-12 MED ORDER — MIRTAZAPINE 45 MG PO TABS
45.0000 mg | ORAL_TABLET | Freq: Every day | ORAL | Status: DC
  Administered 2015-01-12 – 2015-01-13 (×2): 45 mg via ORAL
  Filled 2015-01-12 (×3): qty 1

## 2015-01-12 MED ORDER — TRIAZOLAM 0.25 MG PO TABS
0.2500 mg | ORAL_TABLET | Freq: Every day | ORAL | Status: DC
Start: 2015-01-12 — End: 2015-01-14
  Administered 2015-01-12 – 2015-01-13 (×2): 0.25 mg via ORAL
  Filled 2015-01-12 (×2): qty 1

## 2015-01-12 MED ORDER — CLORAZEPATE DIPOTASSIUM 7.5 MG PO TABS
7.5000 mg | ORAL_TABLET | ORAL | Status: DC
  Administered 2015-01-13 – 2015-01-14 (×2): 7.5 mg via ORAL
  Filled 2015-01-12 (×2): qty 1

## 2015-01-12 MED ORDER — DIVALPROEX SODIUM 250 MG PO DR TAB
250.0000 mg | DELAYED_RELEASE_TABLET | Freq: Every day | ORAL | Status: DC
  Administered 2015-01-12 – 2015-01-14 (×2): 250 mg via ORAL
  Filled 2015-01-12 (×3): qty 1

## 2015-01-12 MED ORDER — FLUCONAZOLE 100 MG PO TABS
100.0000 mg | ORAL_TABLET | Freq: Every day | ORAL | Status: DC
  Administered 2015-01-12 – 2015-01-14 (×2): 100 mg via ORAL
  Filled 2015-01-12 (×3): qty 1

## 2015-01-12 NOTE — Progress Notes (Signed)
[facility] Progress Note  Subjective: The patient feels better today as far as her swallowing is concerned. She's been having difficulty swallowing and regurgitation for a couple of years. Review of her x-rays showed a dilated esophagus and food in the esophagus. The plan was to let it clear spontaneously and then do EGD to see if there might be a stricture. She could also have achalasia.  Objective: Vital signs in last 24 hours: Temp:  [97.9 F (36.6 C)-98.7 F (37.1 C)] 98.7 F (37.1 C) (11/30 0600) Pulse Rate:  [75-85] 75 (11/30 0600) Resp:  [9-27] 22 (11/30 0600) BP: (139-203)/(77-152) 139/85 mmHg (11/30 0600) SpO2:  [91 %-100 %] 96 % (11/30 0600) Weight:  [51.9 kg (114 lb 6.7 oz)] 51.9 kg (114 lb 6.7 oz) (11/29 2030) Weight change:    PE:  No distress  Heart regular rhythm  Lungs clear  Abdomen soft and nontender  Lab Results: Results for orders placed or performed during the hospital encounter of 01/11/15 (from the past 24 hour(s))  CBC with Differential     Status: None   Collection Time: 01/11/15  2:30 PM  Result Value Ref Range   WBC 4.9 4.0 - 10.5 K/uL   RBC 4.05 3.87 - 5.11 MIL/uL   Hemoglobin 12.1 12.0 - 15.0 g/dL   HCT 37.7 36.0 - 46.0 %   MCV 93.1 78.0 - 100.0 fL   MCH 29.9 26.0 - 34.0 pg   MCHC 32.1 30.0 - 36.0 g/dL   RDW 14.1 11.5 - 15.5 %   Platelets 221 150 - 400 K/uL   Neutrophils Relative % 50 %   Neutro Abs 2.5 1.7 - 7.7 K/uL   Lymphocytes Relative 32 %   Lymphs Abs 1.6 0.7 - 4.0 K/uL   Monocytes Relative 12 %   Monocytes Absolute 0.6 0.1 - 1.0 K/uL   Eosinophils Relative 6 %   Eosinophils Absolute 0.3 0.0 - 0.7 K/uL   Basophils Relative 0 %   Basophils Absolute 0.0 0.0 - 0.1 K/uL  Comprehensive metabolic panel     Status: Abnormal   Collection Time: 01/11/15  2:30 PM  Result Value Ref Range   Sodium 142 135 - 145 mmol/L   Potassium 3.7 3.5 - 5.1 mmol/L   Chloride 108 101 - 111 mmol/L   CO2 28 22 - 32 mmol/L   Glucose, Bld  105 (H) 65 - 99 mg/dL   BUN 19 6 - 20 mg/dL   Creatinine, Ser 0.89 0.44 - 1.00 mg/dL   Calcium 8.7 (L) 8.9 - 10.3 mg/dL   Total Protein 6.6 6.5 - 8.1 g/dL   Albumin 3.9 3.5 - 5.0 g/dL   AST 27 15 - 41 U/L   ALT 16 14 - 54 U/L   Alkaline Phosphatase 118 38 - 126 U/L   Total Bilirubin 0.1 (L) 0.3 - 1.2 mg/dL   GFR calc non Af Amer 58 (L) >60 mL/min   GFR calc Af Amer >60 >60 mL/min   Anion gap 6 5 - 15  Lipase, blood     Status: None   Collection Time: 01/11/15  2:30 PM  Result Value Ref Range   Lipase 23 11 - 51 U/L  I-Stat Troponin, ED (not at [facility])     Status: None   Collection Time: 01/11/15  2:44 PM  Result Value Ref Range   Troponin i, poc 0.00 0.00 - 0.08 ng/mL   Comment 3          I-Stat CG4  Lactic Acid, ED     Status: None   Collection Time: 01/11/15  2:51 PM  Result Value Ref Range   Lactic Acid, Venous 1.34 0.5 - 2.0 mmol/L  Urinalysis, Routine w reflex microscopic (not at [facility])     Status: None   Collection Time: 01/11/15  4:13 PM  Result Value Ref Range   Color, Urine YELLOW YELLOW   APPearance CLEAR CLEAR   Specific Gravity, Urine 1.018 1.005 - 1.030   pH 6.5 5.0 - 8.0   Glucose, UA NEGATIVE NEGATIVE mg/dL   Hgb urine dipstick NEGATIVE NEGATIVE   Bilirubin Urine NEGATIVE NEGATIVE   Ketones, ur NEGATIVE NEGATIVE mg/dL   Protein, ur NEGATIVE NEGATIVE mg/dL   Nitrite NEGATIVE NEGATIVE   Leukocytes, UA NEGATIVE NEGATIVE  I-Stat CG4 Lactic Acid, ED     Status: Abnormal   Collection Time: 01/11/15  5:16 PM  Result Value Ref Range   Lactic Acid, Venous 0.45 (L) 0.5 - 2.0 mmol/L    Studies/Results: Dg Chest 2 View  01/12/2015  CLINICAL DATA:  History of pneumonia.  Esophageal narrowing. EXAM: CHEST  2 VIEW COMPARISON:  Barium swallow 01/11/2015.  Chest x-ray 03/30/2014. FINDINGS: Mediastinum and hilar structures normal. Heart size normal. Left base atelectasis and/or mild infiltrate. Stable elevation left hemidiaphragm. Fuse osteopenia degenerative change.  IMPRESSION: 1. Left base subsegmental atelectasis and or mild infiltrate. 2. Stable elevation left hemidiaphragm . Electronically Signed   By: [place]   On: 01/12/2015 08:24   Ct Abdomen Pelvis W Contrast  01/11/2015  CLINICAL DATA:  Nausea and vomiting.  Low abdominal cramping EXAM: CT ABDOMEN AND PELVIS WITH CONTRAST TECHNIQUE: Multidetector CT imaging of the abdomen and pelvis was performed using the standard protocol following bolus administration of intravenous contrast. CONTRAST:  162mL OMNIPAQUE IOHEXOL 300 MG/ML  SOLN COMPARISON:  12/09/2012 FINDINGS: Lower chest and abdominal wall: The distal esophagus is distended with semi-solid material. Best visualized on coronal reformats there is contained appearing outpouching from the left aspect of the distal esophagus extending posterior. No surrounding fat edema or inflammatory enhancement. Small left pleural effusion and lower lobe atelectasis. Hepatobiliary: No focal liver abnormality.Chronic intrahepatic and extrahepatic bile duct enlargement post cholecystectomy. No evidence of calcified choledocholithiasis. Noted liver function tests. Pancreas: Unremarkable. Spleen: Unremarkable. Adrenals/Urinary Tract: Negative adrenals. No hydronephrosis or stone. Unremarkable bladder. Reproductive:Hysterectomy. Stable 2 cm right adnexal cyst, considered benign given long interval and size. Stomach/Bowel: Prominent enhancement at the gastric cardia without focal masslike appearance. There could be stricture given distal esophagus appearance. Large volume colonic stool without small bowel or colonic obstruction. No appendicitis. Vascular/Lymphatic: No acute vascular abnormality. No mass or adenopathy. Peritoneal: No ascites or pneumoperitoneum. Musculoskeletal: Lumbar levoscoliosis with multilevel advanced disc and facet degeneration. These results were called by telephone at the time of interpretation on 01/11/2015 at 5:09 pm to Dr. Brantley Stage , who verbally  acknowledged these results. IMPRESSION: 1. Distended lower esophagus which could be from gastroesophageal reflux/vomiting or distal esophageal stricture. Gas around the lower esophagus favors epiphrenic diverticulum over extraluminal gas, but was not seen in 2014. If esophageal rupture is a clinical possibility, esophagram could be obtained. 2. No definitive explanation for lower abdominal pain. 3. Small left pleural effusion. 4. Large stool volume. Electronically Signed   By: Monte Fantasia M.D.   On: 01/11/2015 17:12   Dg Esophagus W/water Sol Cm  01/11/2015  CLINICAL DATA:  81 year old female with nausea. Question obstruction versus esophageal leak. EXAM: ESOPHOGRAM/BARIUM SWALLOW TECHNIQUE: Single contrast  examination was performed using 10 cc water-soluble. FLUOROSCOPY TIME:  If the device does not provide the exposure index: Fluoroscopy Time:  2 minutes and 31 seconds. Number of Acquired Images: 21 images with combination of last image hold and spot films. COMPARISON:  CT earlier in the day. FINDINGS: The patient was only able to ingest 10 cc of contrast. This revealed prominent retained contents within the esophagus with smooth narrowing distal esophagus above what may be a small hiatal hernia. Contrast did not traverse beyond this region with change of patient position and delayed imaging. This suggest obstructing lesion. No leak identified although contrast did not traverse beyond the hiatus. Results were discussed with Dr. Dolly Rias. Patient is to be kept with head elevated 25 degrees for the next few hours. GI consultation may be considered. IMPRESSION: Prominent retained contents within the esophagus with smooth narrowing distal esophagus above what may be a small hiatal hernia. Contrast did not traverse beyond this region suggesting obstructing lesion. No leak identified although contrast did not traverse beyond the hiatus. Results were discussed with Dr. Dolly Rias. Patient is to be kept with  head elevated 25 degrees for the next few hours. GI consultation may be considered. Electronically Signed   By: Genia Del M.D.   On: 01/11/2015 18:28      Assessment: Dysphagia  Dilated esophagus  Plan:   I will give her the rest of today to try to clear out the esophagus spontaneously and then set her up for EGD tomorrow.    Taylor Roth 01/12/2015, 2:20 PM  Pager: [phone] If no answer or after 5 PM call [phone]

## 2015-01-12 NOTE — Progress Notes (Addendum)
Progress Note   Taylor Roth [id number] DOB: Sep 17, 1928 DOA: 01/11/2015 PCP: Pcp Not In System Dr. Lesia Hausen, [facility]   Brief Narrative:   Taylor Roth is an 81 y.o. female female with a past medical history of osteoporosis, depression, anxiety, COPD, GERD, migraines headaches, chronic kidney disease, chronic mental illness who was brought to the [facility] emergency department from [facility] due to complaints of "gagging and spitting up" mucus. She had a similar regurgitation episode that happened about 2 weeks ago that resolved on its own, but she states that she has been having this problem for about 2 years or so. She denies abdominal pain, nausea, emesis, diarrhea, melena or hematochezia. Per daughter, the patient gets frequent constipation.  Workup in the ER on 01/11/15 revealed a dilated distal esophagus on CT scan. She was admitted to the service for medical management and GI consultation.  Assessment/Plan:   Principal Problem:  Achalasia of esophagus/Dysphagia - Clear liquids started today. Only took sips of water overnight, but no vomiting per patient. - Substitute essential oral meds for IV formulation or similar. - Antiemetics and analgesics as needed IV. - Continue workup per GI. EGD planned for 01/13/15.  Active Problems:  Anxiety - Ativan IV push as needed while the patient is nothing by mouth.   COPD (chronic obstructive pulmonary disease) ([place]) - Stable. Continue bronchodilators as needed.   GERD (gastroesophageal reflux disease) - Continue proton pump inhibitor.   Glaucoma - Continue Xalatan and Cosopt.    DVT Prophylaxis - SCDs ordered.   Family Communication/Anticipated D/C date and plan/Code Status   Family Communication: Daughter, [name] [phone] the bedside. Disposition Plan: ALF [facility]) when stable. Anticipated D/C date:   01/14/15 if EGD clear and no further treatment needed. Code  Status: Full code.   IV Access:    Peripheral IV   Procedures and diagnostic studies:   Ct Abdomen Pelvis W Contrast  01/11/2015  CLINICAL DATA:  Nausea and vomiting.  Low abdominal cramping EXAM: CT ABDOMEN AND PELVIS WITH CONTRAST TECHNIQUE: Multidetector CT imaging of the abdomen and pelvis was performed using the standard protocol following bolus administration of intravenous contrast. CONTRAST:  136mL OMNIPAQUE IOHEXOL 300 MG/ML  SOLN COMPARISON:  12/09/2012 FINDINGS: Lower chest and abdominal wall: The distal esophagus is distended with semi-solid material. Best visualized on coronal reformats there is contained appearing outpouching from the left aspect of the distal esophagus extending posterior. No surrounding fat edema or inflammatory enhancement. Small left pleural effusion and lower lobe atelectasis. Hepatobiliary: No focal liver abnormality.Chronic intrahepatic and extrahepatic bile duct enlargement post cholecystectomy. No evidence of calcified choledocholithiasis. Noted liver function tests. Pancreas: Unremarkable. Spleen: Unremarkable. Adrenals/Urinary Tract: Negative adrenals. No hydronephrosis or stone. Unremarkable bladder. Reproductive:Hysterectomy. Stable 2 cm right adnexal cyst, considered benign given long interval and size. Stomach/Bowel: Prominent enhancement at the gastric cardia without focal masslike appearance. There could be stricture given distal esophagus appearance. Large volume colonic stool without small bowel or colonic obstruction. No appendicitis. Vascular/Lymphatic: No acute vascular abnormality. No mass or adenopathy. Peritoneal: No ascites or pneumoperitoneum. Musculoskeletal: Lumbar levoscoliosis with multilevel advanced disc and facet degeneration. These results were called by telephone at the time of interpretation on 01/11/2015 at 5:09 pm to Dr. Brantley Stage , who verbally acknowledged these results. IMPRESSION: 1. Distended lower esophagus which could be from  gastroesophageal reflux/vomiting or distal esophageal stricture. Gas around the lower esophagus favors epiphrenic diverticulum over extraluminal gas, but was not  seen in 2014. If esophageal rupture is a clinical possibility, esophagram could be obtained. 2. No definitive explanation for lower abdominal pain. 3. Small left pleural effusion. 4. Large stool volume. Electronically Signed   By: Monte Fantasia M.D.   On: 01/11/2015 17:12   Dg Esophagus W/water Sol Cm  01/11/2015  CLINICAL DATA:  81 year old female with nausea. Question obstruction versus esophageal leak. EXAM: ESOPHOGRAM/BARIUM SWALLOW TECHNIQUE: Single contrast examination was performed using 10 cc water-soluble. FLUOROSCOPY TIME:  If the device does not provide the exposure index: Fluoroscopy Time:  2 minutes and 31 seconds. Number of Acquired Images: 21 images with combination of last image hold and spot films. COMPARISON:  CT earlier in the day. FINDINGS: The patient was only able to ingest 10 cc of contrast. This revealed prominent retained contents within the esophagus with smooth narrowing distal esophagus above what may be a small hiatal hernia. Contrast did not traverse beyond this region with change of patient position and delayed imaging. This suggest obstructing lesion. No leak identified although contrast did not traverse beyond the hiatus. Results were discussed with Dr. Dolly Rias. Patient is to be kept with head elevated 25 degrees for the next few hours. GI consultation may be considered. IMPRESSION: Prominent retained contents within the esophagus with smooth narrowing distal esophagus above what may be a small hiatal hernia. Contrast did not traverse beyond this region suggesting obstructing lesion. No leak identified although contrast did not traverse beyond the hiatus. Results were discussed with Dr. Dolly Rias. Patient is to be kept with head elevated 25 degrees for the next few hours. GI consultation may be considered.  Electronically Signed   By: Genia Del M.D.   On: 01/11/2015 18:28     Medical Consultants:    Gastroenterology: Laurence Spates, MD  Anti-Infectives:   Anti-infectives    Start     Dose/Rate Route Frequency Ordered Stop   01/12/15 2000  fluconazole (DIFLUCAN) tablet 100 mg     100 mg Oral Daily 01/12/15 1745        Subjective:   Taylor Roth denies any N/V. Only took sips of water.  Refused to take in any other liquids.  Denies pain or sensation of spasm in the esophagus. Bowels moved yesterday, loose.  Daughter says she was paranoid last night, and "called the police to report fraud".  Objective:    Filed Vitals:   01/12/15 2012 01/12/15 2109 01/13/15 0527 01/13/15 0737  BP:  147/73 139/69   Pulse:  78 62 70  Temp:  98.9 F (37.2 C) 99.3 F (37.4 C)   TempSrc:  Oral Oral   Resp:  20 20 16   Height:      Weight:      SpO2: 90% 92% 95% 94%    Intake/Output Summary (Last 24 hours) at 01/13/15 1030 Last data filed at 01/13/15 0600  Gross per 24 hour  Intake   1630 ml  Output    450 ml  Net   1180 ml   Filed Weights   01/11/15 2030  Weight: 51.9 kg (114 lb 6.7 oz)    Exam: Gen:  NAD Cardiovascular:  RRR, No M/R/G Respiratory:  Lungs CTAB Gastrointestinal:  Abdomen softly distended, NT/ND, + BS Extremities:  No C/E/C   Data Reviewed:    Labs: Basic Metabolic Panel:  Recent Labs Lab 01/11/15 1430  NA 142  K 3.7  CL 108  CO2 28  GLUCOSE 105*  BUN 19  CREATININE 0.89  CALCIUM  8.7*   GFR Estimated Creatinine Clearance: 37.2 mL/min (by C-G formula based on Cr of 0.89). Liver Function Tests:  Recent Labs Lab 01/11/15 1430  AST 27  ALT 16  ALKPHOS 118  BILITOT 0.1*  PROT 6.6  ALBUMIN 3.9    Recent Labs Lab 01/11/15 1430  LIPASE 23   CBC:  Recent Labs Lab 01/11/15 1430  WBC 4.9  NEUTROABS 2.5  HGB 12.1  HCT 37.7  MCV 93.1  PLT 221   Sepsis Labs:  Recent Labs Lab 01/11/15 1430 01/11/15 1451 01/11/15 1716  WBC  4.9  --   --   LATICACIDVEN  --  1.34 0.45*   Microbiology No results found for this or any previous visit (from the past 240 hour(s)).   Medications:   . budesonide  0.25 mg Nebulization BID  . clorazepate  7.5 mg Oral 2 times per day  . divalproex  250 mg Oral Daily  . dorzolamide-timolol  1 drop Both Eyes BID  . fluconazole  100 mg Oral Daily  . fludrocortisone  0.1 mg Oral q morning - 10a  . latanoprost  1 drop Both Eyes QHS  . mirtazapine  45 mg Oral QHS  . pantoprazole (PROTONIX) IV  40 mg Intravenous Q24H  . QUEtiapine Fumarate  150 mg Oral Q2000  . triazolam  0.25 mg Oral QHS   Continuous Infusions: . 0.9 % NaCl with KCl 20 mEq / L 50 mL/hr at 01/12/15 1815    Time spent: 25 minutes.   LOS: 1 day   Ranelle Auker  [name] Pager [phone]. If unable to reach me by pager, please call my cell phone at [phone].  *Please refer to [email or web address], password [email or web address] to get updated schedule on who will round on this patient, as hospitalists switch teams weekly. If 7PM-7AM, please contact night-coverage at [email or web address], password [email or web address] for any overnight needs.  01/13/2015, 10:30 AM

## 2015-01-13 ENCOUNTER — Encounter (HOSPITAL_COMMUNITY): Payer: Self-pay

## 2015-01-13 ENCOUNTER — Inpatient Hospital Stay (HOSPITAL_COMMUNITY): Payer: Medicare Other | Admitting: Anesthesiology

## 2015-01-13 ENCOUNTER — Encounter (HOSPITAL_COMMUNITY)
Admission: EM | Disposition: A | Discharge status: Nursing Home (Includes ICF) | Payer: Self-pay | Source: Home / Self Care | Attending: Internal Medicine

## 2015-01-13 DIAGNOSIS — R112 Nausea with vomiting, unspecified: Secondary | ICD-10-CM

## 2015-01-13 DIAGNOSIS — K229 Disease of esophagus, unspecified: Secondary | ICD-10-CM

## 2015-01-13 DIAGNOSIS — R1110 Vomiting, unspecified: Secondary | ICD-10-CM | POA: Diagnosis present

## 2015-01-13 HISTORY — PX: ESOPHAGOGASTRODUODENOSCOPY (EGD) WITH PROPOFOL: SHX5813

## 2015-01-13 SURGERY — ESOPHAGOGASTRODUODENOSCOPY (EGD) WITH PROPOFOL
Anesthesia: Monitor Anesthesia Care

## 2015-01-13 MED ORDER — PROPOFOL 10 MG/ML IV BOLUS
INTRAVENOUS | Status: DC | PRN
  Administered 2015-01-13: 30 mg via INTRAVENOUS
  Administered 2015-01-13: 20 mg via INTRAVENOUS

## 2015-01-13 MED ORDER — PROPOFOL 10 MG/ML IV BOLUS
INTRAVENOUS | Status: AC
  Filled 2015-01-13: qty 40

## 2015-01-13 MED ORDER — LACTATED RINGERS IV SOLN
INTRAVENOUS | Status: DC | PRN
  Administered 2015-01-13: 14:00:00 via INTRAVENOUS

## 2015-01-13 MED ORDER — PROPOFOL 500 MG/50ML IV EMUL
INTRAVENOUS | Status: DC | PRN
  Administered 2015-01-13: 100 ug/kg/min via INTRAVENOUS

## 2015-01-13 SURGICAL SUPPLY — 14 items

## 2015-01-13 NOTE — Anesthesia Preprocedure Evaluation (Signed)
Anesthesia Evaluation  Patient identified by MRN, date of birth, ID band Patient awake    Reviewed: Allergy & Precautions, NPO status , Patient's Chart, lab work & pertinent test results  Airway Mallampati: II   Neck ROM: full    Dental   Pulmonary COPD, former smoker,    breath sounds clear to auscultation       Cardiovascular hypertension,  Rhythm:regular Rate:Normal     Neuro/Psych  Headaches, Anxiety Depression    GI/Hepatic GERD  ,  Endo/Other    Renal/GU Renal disease     Musculoskeletal   Abdominal   Peds  Hematology   Anesthesia Other Findings   Reproductive/Obstetrics                             Anesthesia Physical Anesthesia Plan  ASA: III  Anesthesia Plan: MAC   Post-op Pain Management:    Induction: Intravenous  Airway Management Planned: Nasal Cannula  Additional Equipment:   Intra-op Plan:   Post-operative Plan:   Informed Consent: I have reviewed the patients History and Physical, chart, labs and discussed the procedure including the risks, benefits and alternatives for the proposed anesthesia with the patient or authorized representative who has indicated his/her understanding and acceptance.     Plan Discussed with: CRNA, Anesthesiologist and Surgeon  Anesthesia Plan Comments:         Anesthesia Quick Evaluation

## 2015-01-13 NOTE — Progress Notes (Signed)
Progress Note   Taylor Roth [id number] DOB: 08-Feb-1929 DOA: 01/11/2015 PCP: No primary care [facility] on file.   Brief Narrative:   Taylor Roth is an 81 y.o. female female with a past medical history of osteoporosis, depression, anxiety, COPD, GERD, migraines headaches, chronic kidney disease, chronic mental illness who was brought to the [facility] emergency department from [facility] due to complaints of "gagging and spitting up" mucus. She had a similar regurgitation episode that happened about 2 weeks ago that resolved on its own, but she states that she has been having this problem for about 2 years or so. She denies abdominal pain, nausea, emesis, diarrhea, melena or hematochezia. Per daughter, the patient gets frequent constipation.  Workup in the ER on 01/11/15 revealed a dilated distal esophagus on CT scan. She was admitted to the service for medical management and GI consultation.  Assessment/Plan:   Principal Problem:  Achalasia of esophagus/Dysphagia - Continue workup per GI. EGD planned for 01/13/15.  Active Problems:  Anxiety - Ativan IV push as needed while the patient is nothing by mouth.   COPD (chronic obstructive pulmonary disease) ([place]) - Stable. Continue bronchodilators as needed.   GERD (gastroesophageal reflux disease) - Continue proton pump inhibitor.   Glaucoma - Continue Xalatan and Cosopt.    DVT Prophylaxis - SCDs ordered.   Family Communication/Anticipated D/C date and plan/Code Status   Family Communication: No family at the bedside. Disposition Plan: ALF [facility]) when stable. Anticipated D/C date:    Code Status: Full code.   IV Access:    Peripheral IV   Procedures and diagnostic studies:   Ct Abdomen Pelvis W Contrast  01/11/2015  CLINICAL DATA:  Nausea and vomiting.  Low abdominal cramping EXAM: CT ABDOMEN AND PELVIS WITH CONTRAST TECHNIQUE: Multidetector CT imaging of the abdomen and  pelvis was performed using the standard protocol following bolus administration of intravenous contrast. CONTRAST:  133mL OMNIPAQUE IOHEXOL 300 MG/ML  SOLN COMPARISON:  12/09/2012 FINDINGS: Lower chest and abdominal wall: The distal esophagus is distended with semi-solid material. Best visualized on coronal reformats there is contained appearing outpouching from the left aspect of the distal esophagus extending posterior. No surrounding fat edema or inflammatory enhancement. Small left pleural effusion and lower lobe atelectasis. Hepatobiliary: No focal liver abnormality.Chronic intrahepatic and extrahepatic bile duct enlargement post cholecystectomy. No evidence of calcified choledocholithiasis. Noted liver function tests. Pancreas: Unremarkable. Spleen: Unremarkable. Adrenals/Urinary Tract: Negative adrenals. No hydronephrosis or stone. Unremarkable bladder. Reproductive:Hysterectomy. Stable 2 cm right adnexal cyst, considered benign given long interval and size. Stomach/Bowel: Prominent enhancement at the gastric cardia without focal masslike appearance. There could be stricture given distal esophagus appearance. Large volume colonic stool without small bowel or colonic obstruction. No appendicitis. Vascular/Lymphatic: No acute vascular abnormality. No mass or adenopathy. Peritoneal: No ascites or pneumoperitoneum. Musculoskeletal: Lumbar levoscoliosis with multilevel advanced disc and facet degeneration. These results were called by telephone at the time of interpretation on 01/11/2015 at 5:09 pm to Dr. Brantley Stage , who verbally acknowledged these results. IMPRESSION: 1. Distended lower esophagus which could be from gastroesophageal reflux/vomiting or distal esophageal stricture. Gas around the lower esophagus favors epiphrenic diverticulum over extraluminal gas, but was not seen in 2014. If esophageal rupture is a clinical possibility, esophagram could be obtained. 2. No definitive explanation for lower abdominal  pain. 3. Small left pleural effusion. 4. Large stool volume. Electronically Signed   By: Monte Fantasia M.D.   On: 01/11/2015 17:12  Dg Esophagus W/water Sol Cm  01/11/2015  CLINICAL DATA:  81 year old female with nausea. Question obstruction versus esophageal leak. EXAM: ESOPHOGRAM/BARIUM SWALLOW TECHNIQUE: Single contrast examination was performed using 10 cc water-soluble. FLUOROSCOPY TIME:  If the device does not provide the exposure index: Fluoroscopy Time:  2 minutes and 31 seconds. Number of Acquired Images: 21 images with combination of last image hold and spot films. COMPARISON:  CT earlier in the day. FINDINGS: The patient was only able to ingest 10 cc of contrast. This revealed prominent retained contents within the esophagus with smooth narrowing distal esophagus above what may be a small hiatal hernia. Contrast did not traverse beyond this region with change of patient position and delayed imaging. This suggest obstructing lesion. No leak identified although contrast did not traverse beyond the hiatus. Results were discussed with Dr. Dolly Rias. Patient is to be kept with head elevated 25 degrees for the next few hours. GI consultation may be considered. IMPRESSION: Prominent retained contents within the esophagus with smooth narrowing distal esophagus above what may be a small hiatal hernia. Contrast did not traverse beyond this region suggesting obstructing lesion. No leak identified although contrast did not traverse beyond the hiatus. Results were discussed with Dr. Dolly Rias. Patient is to be kept with head elevated 25 degrees for the next few hours. GI consultation may be considered. Electronically Signed   By: Genia Del M.D.   On: 01/11/2015 18:28     Medical Consultants:    Gastroenterology: Laurence Spates, MD  Anti-Infectives:   Anti-infectives    Start     Dose/Rate Route Frequency Ordered Stop   01/12/15 2000  fluconazole (DIFLUCAN) tablet 100 mg     100 mg Oral  Daily 01/12/15 1745        Subjective:   Taylor Roth wants to eat and drink. Complains of a headache. Says her BP has been running a little higher than normal the past few days. Bowels moved yesterday. No N/V.  Objective:    Filed Vitals:   01/12/15 2012 01/12/15 2109 01/13/15 0527 01/13/15 0737  BP:  147/73 139/69   Pulse:  78 62 70  Temp:  98.9 F (37.2 C) 99.3 F (37.4 C)   TempSrc:  Oral Oral   Resp:  20 20 16   Height:      Weight:      SpO2: 90% 92% 95% 94%    Intake/Output Summary (Last 24 hours) at 01/13/15 0754 Last data filed at 01/13/15 0600  Gross per 24 hour  Intake   1630 ml  Output    575 ml  Net   1055 ml   Filed Weights   01/11/15 2030  Weight: 51.9 kg (114 lb 6.7 oz)    Exam: Gen:  NAD Cardiovascular:  RRR, No M/R/G Respiratory:  Lungs CTAB Gastrointestinal:  Abdomen softly distended, NT/ND, + BS Extremities:  No C/E/C   Data Reviewed:    Labs: Basic Metabolic Panel:  Recent Labs Lab 01/11/15 1430  NA 142  K 3.7  CL 108  CO2 28  GLUCOSE 105*  BUN 19  CREATININE 0.89  CALCIUM 8.7*   GFR Estimated Creatinine Clearance: 37.2 mL/min (by C-G formula based on Cr of 0.89). Liver Function Tests:  Recent Labs Lab 01/11/15 1430  AST 27  ALT 16  ALKPHOS 118  BILITOT 0.1*  PROT 6.6  ALBUMIN 3.9    Recent Labs Lab 01/11/15 1430  LIPASE 23   CBC:  Recent Labs Lab 01/11/15  1430  WBC 4.9  NEUTROABS 2.5  HGB 12.1  HCT 37.7  MCV 93.1  PLT 221   Sepsis Labs:  Recent Labs Lab 01/11/15 1430 01/11/15 1451 01/11/15 1716  WBC 4.9  --   --   LATICACIDVEN  --  1.34 0.45*   Microbiology No results found for this or any previous visit (from the past 240 hour(s)).   Medications:   . budesonide  0.25 mg Nebulization BID  . clorazepate  7.5 mg Oral 2 times per day  . divalproex  250 mg Oral Daily  . dorzolamide-timolol  1 drop Both Eyes BID  . fluconazole  100 mg Oral Daily  . fludrocortisone  0.1 mg Oral q  morning - 10a  . latanoprost  1 drop Both Eyes QHS  . mirtazapine  45 mg Oral QHS  . pantoprazole (PROTONIX) IV  40 mg Intravenous Q24H  . QUEtiapine Fumarate  150 mg Oral Q2000  . triazolam  0.25 mg Oral QHS   Continuous Infusions: . 0.9 % NaCl with KCl 20 mEq / L 50 mL/hr at 01/12/15 1815    Time spent: 25 minutes.   LOS: 1 day   RAMA,CHRISTINA  [name] Pager [phone]. If unable to reach me by pager, please call my cell phone at [phone].  *Please refer to [email or web address], password [email or web address] to get updated schedule on who will round on this patient, as hospitalists switch teams weekly. If 7PM-7AM, please contact night-coverage at [email or web address], password [email or web address] for any overnight needs.  01/13/2015, 7:54 AM

## 2015-01-13 NOTE — Transfer of Care (Signed)
Immediate Anesthesia Transfer of Care Note  Patient: Taylor Roth  Procedure(s) Performed: Procedure(s): ESOPHAGOGASTRODUODENOSCOPY (EGD) WITH PROPOFOL (N/A)  Patient Location: PACU and Endoscopy Unit  Anesthesia Type:MAC  Level of Consciousness: awake, sedated and patient cooperative  Airway & Oxygen Therapy: Patient Spontanous Breathing and Patient connected to nasal cannula oxygen  Post-op Assessment: Report given to RN and Post -op Vital signs reviewed and stable  Post vital signs: Reviewed and stable  Last Vitals:  Filed Vitals:   01/13/15 0737 01/13/15 1354  BP:  183/88  Pulse: 70 75  Temp:  36.8 C  Resp: 16 14    Complications: No apparent anesthesia complications

## 2015-01-13 NOTE — Op Note (Signed)
[place], [id number]   ENDOSCOPY PROCEDURE REPORT  PATIENT: Taylor Roth, Taylor Roth  MR#: [id number] BIRTHDATE: April 20, 1928 , 86  yrs. old GENDER: female ENDOSCOPIST: Acquanetta Sit, MD REFERRED BY: PROCEDURE DATE:  02/04/2015 PROCEDURE:  EGD with biopsy ASA CLASS:     3 INDICATIONS:  dysphagia MEDICATIONS: propofol per anesthesia TOPICAL ANESTHETIC:  DESCRIPTION OF PROCEDURE: After the risks benefits and alternatives of the procedure were thoroughly explained, informed consent was obtained.  The EG [id number] ) endoscope was introduced through the mouth and advanced to thepyloric channel with visualization into the duodenal bulb but I could not advance it to the second portion of the duodenum ,      as there wassome stenosis of the pyloric channel.  The instrument was slowly withdrawn as the mucosa was fully examined. Estimated blood loss is zero unless otherwise noted in this procedure report.  Findings  Esophagus: The esophagus was very tortuous. There was no food in the esophagus. There was a nonobstructing Schatzki's ring. There was angulation of the distal esophagus just before entering the stomach as seen in image 9creating what looks to be a pocketat the distal esophagus.  Stomach;Diffuse erythema. Linear ulceration and pyloric channel biopsied.  Duodenum: Duodenal bulb looks normal.    The scope was then withdrawn from the patient and the procedure completed.  COMPLICATIONS: There were no immediate complications.  ENDOSCOPIC IMPRESSION:see above. I do not see an obvious stricture to dilate. It could be that food is getting caught in the pocket just beforeit enters into the stomach as it seems to [facility] acute angulation as seen in image 009. I would recommend softmoist foods.I would continue pantoprazole 40 mg daily. Check biopsiesprimarily for H. pylori.   RECOMMENDATIONS:see above,   REPEAT EXAM:  eSigned:  Acquanetta Sit, MD Feb 04, 2015 3:12 PM    CC:  CPT CODES: ICD CODES:  The ICD and CPT codes recommended by this software are interpretations from the data that the clinical staff has captured with the software.  The verification of the translation of this report to the ICD and CPT codes and modifiers is the sole responsibility of the health care institution and practicing physician where this report was generated.  [employer] will not be held responsible for the validity of the ICD and CPT codes included on this report.  AMA assumes no liability for data contained or not contained herein. CPT is a Designer, television/film set of the [employer].  PATIENT NAME:  [name], [name] MR#: [id number]

## 2015-01-14 ENCOUNTER — Encounter (HOSPITAL_COMMUNITY): Payer: Self-pay | Admitting: Gastroenterology

## 2015-01-14 DIAGNOSIS — N183 Chronic kidney disease, stage 3 unspecified: Secondary | ICD-10-CM

## 2015-01-14 DIAGNOSIS — Q394 Esophageal web: Secondary | ICD-10-CM

## 2015-01-14 DIAGNOSIS — R531 Weakness: Secondary | ICD-10-CM

## 2015-01-14 DIAGNOSIS — E876 Hypokalemia: Secondary | ICD-10-CM

## 2015-01-14 DIAGNOSIS — K222 Esophageal obstruction: Secondary | ICD-10-CM

## 2015-01-14 HISTORY — DX: Chronic kidney disease, stage 3 unspecified: N18.30

## 2015-01-14 HISTORY — DX: Esophageal obstruction: K22.2

## 2015-01-14 LAB — CBC
HEMATOCRIT: 34.8 % — AB (ref 36.0–46.0)
HEMOGLOBIN: 11.4 g/dL — AB (ref 12.0–15.0)
MCH: 30.2 pg (ref 26.0–34.0)
MCHC: 32.8 g/dL (ref 30.0–36.0)
MCV: 92.3 fL (ref 78.0–100.0)
Platelets: 180 10*3/uL (ref 150–400)
RBC: 3.77 MIL/uL — ABNORMAL LOW (ref 3.87–5.11)
RDW: 14.1 % (ref 11.5–15.5)
WBC: 6.3 10*3/uL (ref 4.0–10.5)

## 2015-01-14 LAB — BASIC METABOLIC PANEL
ANION GAP: 6 (ref 5–15)
BUN: 13 mg/dL (ref 6–20)
CALCIUM: 8.5 mg/dL — AB (ref 8.9–10.3)
CO2: 26 mmol/L (ref 22–32)
Chloride: 108 mmol/L (ref 101–111)
Creatinine, Ser: 0.75 mg/dL (ref 0.44–1.00)
GFR calc Af Amer: >60 mL/min (ref >60)
GFR calc non Af Amer: >60 mL/min (ref >60)
GLUCOSE: 106 mg/dL — AB (ref 65–99)
POTASSIUM: 3.2 mmol/L — AB (ref 3.5–5.1)
Sodium: 140 mmol/L (ref 135–145)

## 2015-01-14 MED ORDER — DULOXETINE HCL 30 MG PO CPEP
30.0000 mg | ORAL_CAPSULE | Freq: Every day | ORAL | Status: DC
  Administered 2015-01-14: 30 mg via ORAL
  Filled 2015-01-14: qty 1

## 2015-01-14 MED ORDER — PANTOPRAZOLE SODIUM 40 MG PO TBEC: 40.0000 mg | DELAYED_RELEASE_TABLET | Freq: Every day | ORAL | Status: DC

## 2015-01-14 MED ORDER — POTASSIUM CHLORIDE CRYS ER 20 MEQ PO TBCR
40.0000 meq | EXTENDED_RELEASE_TABLET | Freq: Once | ORAL | Status: AC
  Administered 2015-01-14: 40 meq via ORAL
  Filled 2015-01-14: qty 2

## 2015-01-14 MED ORDER — LORAZEPAM 0.5 MG PO TABS: 0.2500 mg | ORAL_TABLET | Freq: Every day | ORAL | Status: DC

## 2015-01-14 MED ORDER — PANTOPRAZOLE SODIUM 40 MG PO TBEC
40.0000 mg | DELAYED_RELEASE_TABLET | Freq: Every day | ORAL | Status: DC
  Administered 2015-01-14: 40 mg via ORAL
  Filled 2015-01-14: qty 1

## 2015-01-14 NOTE — Progress Notes (Signed)
[id number] Aneira Cavitt,RN,BSN,CCM:  Patient has chosen [facility] for P.T. Advanced representative notified.

## 2015-01-14 NOTE — NC FL2 (Signed)
[place] LEVEL OF CARE SCREENING TOOL     IDENTIFICATION  Patient Name: Taylor Roth Birthdate: [birth year] Sex: female Admission Date (Current Location): 01/11/2015  [place] and [place] Number:  Sports coach )   Facility and Address:  [facility],  [place] [address], [place]      Provider Number: [id number]  Attending Physician Name and Address:  No att. providers found  Relative Name and Phone Number:       Current Level of Care: Hospital Recommended Level of Care: [place] Prior Approval Number:    Date Approved/Denied:   PASRR Number:    Discharge Plan: Other (Comment) (ALF with HHPT)    Current Diagnoses: Patient Active Problem List   Diagnosis Date Noted  . Weakness 01/14/2015  . Schatzki's ring 01/14/2015  . CKD (chronic kidney disease), stage III 01/14/2015  . Esophagus disorder   . Dysphagia 01/11/2015  . UTI (lower urinary tract infection) 03/30/2014  . Hearing loss, sensorineural 03/04/2014  . Nasal lesion 03/04/2014  . Unspecified constipation 12/12/2012  . Hypokalemia 12/11/2012  . Orthostatic hypotension 12/11/2012  . Arterial tortuosity (aorta) 12/11/2012  . Protein-calorie malnutrition, severe ([place]) 12/10/2012  . Decreased rectal sphincter tone 11/21/2012  . Idiopathic scoliosis 07/08/2012  . Macular degeneration 06/26/2011  . Vision disturbance 06/26/2011  . Glaucoma 06/26/2011  . Anemia 04/17/2011  . Drug-seeking behavior 02/26/2011  . Osteoporosis 02/26/2011  . Depression   . Anxiety   . COPD (chronic obstructive pulmonary disease) ([place])   . GERD (gastroesophageal reflux disease)   . Migraines   . Insomnia, unspecified     Orientation ACTIVITIES/SOCIAL BLADDER RESPIRATION    Self, Time, Place  Family supportive Continent Normal  BEHAVIORAL SYMPTOMS/MOOD NEUROLOGICAL BOWEL NUTRITION STATUS      Continent Diet (Soft)  PHYSICIAN VISITS COMMUNICATION OF NEEDS Height & Weight Skin     Verbally   114 lbs. Normal          AMBULATORY STATUS RESPIRATION    Supervision limited Normal      Personal Care Assistance Level of Assistance  Bathing, Feeding, Dressing Bathing Assistance: Limited assistance Feeding assistance: Limited assistance Dressing Assistance: Limited assistance      Functional Limitations Info  Sight Sight Info: Impaired           SPECIAL CARE FACTORS FREQUENCY   ([place] order for Pt )                   Additional Factors Info  Code Status, Allergies, Psychotropic   Allergies Info: MACROBID NITROFURANTOIN MACROCRYSTAL, AZITHROMYCIN, doxycycline, escitalipram, fieorcet, latex, levofloxicine, Oxycodone, nsaids, restasis, sulfa antibiotics, Tylenol, Biaxin,  Psychotropic Info: Ativan Cymbalta Remeron Depacote          Current Medications (01/14/2015):  This is the current hospital active medication list Current Facility-Administered Medications  Medication Dose Route Frequency Provider Last Rate Last Dose  . 0.9 % NaCl with KCl 20 mEq/ L  infusion   Intravenous Continuous Reubin Milan, MD 50 mL/hr at 01/14/15 1106    . budesonide (PULMICORT) nebulizer solution 0.25 mg  0.25 mg Nebulization BID [name], MD   0.25 mg at 01/14/15 0802  . clorazepate (TRANXENE) tablet 7.5 mg  7.5 mg Oral 2 times per day [name], MD   7.5 mg at 01/14/15 1134  . divalproex (DEPAKOTE) DR tablet 250 mg  250 mg Oral Daily [name], MD   250 mg at 01/14/15 1134  . dorzolamide-timolol (  COSOPT) 22.3-6.8 MG/ML ophthalmic solution 1 drop  1 drop Both Eyes BID Reubin Milan, MD   1 drop at 01/14/15 1134  . DULoxetine (CYMBALTA) DR capsule 30 mg  30 mg Oral Daily [name], MD   30 mg at 01/14/15 1134  . fluconazole (DIFLUCAN) tablet 100 mg  100 mg Oral Daily [name], MD   100 mg at 01/14/15 1133  . fludrocortisone (FLORINEF) tablet 0.1 mg  0.1 mg Oral q morning - 10a Christina P Rama, MD   0.1 mg at 01/14/15 1134  .  HYDROmorphone (DILAUDID) injection 1 mg  1 mg Intravenous Q4H PRN Reubin Milan, MD   1 mg at 01/14/15 1134  . latanoprost (XALATAN) 0.005 % ophthalmic solution 1 drop  1 drop Both Eyes QHS Reubin Milan, MD   1 drop at 01/13/15 2011  . LORazepam (ATIVAN) tablet 0.25-0.5 mg  0.25-0.5 mg Oral QHS Christina P Rama, MD      . mirtazapine (REMERON) tablet 45 mg  45 mg Oral QHS [name], MD   45 mg at 01/13/15 2005  . ondansetron (ZOFRAN) injection 4 mg  4 mg Intravenous Q6H PRN Reubin Milan, MD      . pantoprazole (PROTONIX) EC tablet 40 mg  40 mg Oral Q0600 [name], MD   40 mg at 01/14/15 1137  . QUEtiapine (SEROQUEL XR) 24 hr tablet 150 mg  150 mg Oral Q2000 [name], MD   150 mg at 01/13/15 2005  . triazolam (HALCION) tablet 0.25 mg  0.25 mg Oral QHS [name], MD   0.25 mg at 01/13/15 2006   Current Outpatient Prescriptions  Medication Sig Dispense Refill  . acetaminophen (TYLENOL) 325 MG tablet Take 650 mg by mouth every 6 (six) hours as needed for headache.    . Acetic Acid (DOUCHE VINEGAR/WATER VA) Place 1 each around the anus once a week. To prevent UTI    . aspirin 81 MG chewable tablet Chew 1 tablet (81 mg total) by mouth daily.    . Cholecalciferol (VITAMIN D) 1000 UNITS capsule Take 1,000 Units by mouth daily.    . clorazepate (TRANXENE) 7.5 MG tablet Take 7.5 mg by mouth 2 (two) times daily.     . Cranberry 425 MG CAPS Take 1 capsule by mouth 2 (two) times daily.    . diclofenac (FLECTOR) 1.3 % PTCH Place 1 patch onto the skin every 12 (twelve) hours as needed (For localized pain.).     Marland Kitchen divalproex (DEPAKOTE) 250 MG DR tablet Take 250 mg by mouth daily.    Marland Kitchen docusate sodium (COLACE) 100 MG capsule Take 100 mg by mouth 2 (two) times daily.    . dorzolamide-timolol (COSOPT) 22.3-6.8 MG/ML ophthalmic solution Place 1 drop into both eyes 2 (two) times daily.    . DULoxetine (CYMBALTA) 30 MG capsule Take 30 mg by mouth daily.    . fluconazole  (DIFLUCAN) 100 MG tablet Take 100 mg by mouth daily.    . fludrocortisone (FLORINEF) 0.1 MG tablet Take 0.1 mg by mouth every morning.    . fluticasone (FLOVENT HFA) 110 MCG/ACT inhaler Inhale 1 puff into the lungs 2 (two) times daily as needed (shortness of breath.).    Marland Kitchen FLUZONE HIGH-DOSE 0.5 ML SUSY ADM 0.5ML IM UTD  0  . Ginkgo Biloba 120 MG CAPS Take 120 mg by mouth 2 (two) times daily.     Marland Kitchen HYDROcodone-acetaminophen (NORCO) 10-325 MG per  tablet Take 1 tablet by mouth every 6 (six) hours as needed (pain). (Patient taking differently: Take 1 tablet by mouth every 6 (six) hours as needed. Scheduled at 6am, 12 noon, and 6pm. And may take 1 tablet at 12 midnight as needed for pain) 30 tablet 0  . hydrocortisone (ANUSOL-HC) 2.5 % rectal cream Place 1 application rectally 2 (two) times daily as needed for hemorrhoids or itching.    Marland Kitchen LORazepam (ATIVAN) 0.5 MG tablet Take 0.25-0.5 mg by mouth at bedtime. Take half a tablet nightly at bedtime. Take a full tablet twice daily as needed for anxiety    . meclizine (ANTIVERT) 25 MG tablet Take 25 mg by mouth 3 (three) times daily as needed for dizziness. Do not combine with Hydroxyzine or any other antihistamine.    . Melatonin 1 MG TABS Take 1 mg by mouth at bedtime.     . mineral oil-hydrophilic petrolatum (AQUAPHOR) ointment Apply 1 application topically daily.    . mirtazapine (REMERON) 45 MG tablet Take 45 mg by mouth at bedtime.    . nitroGLYCERIN (NITROSTAT) 0.4 MG SL tablet Place 0.4 mg under the tongue every 5 (five) minutes as needed for chest pain. May use 3 times    . nystatin (MYCOSTATIN) 100000 UNIT/ML suspension Take 5 mLs by mouth 3 (three) times daily as needed.    . ondansetron (ZOFRAN) 4 MG tablet Take 1 tablet (4 mg total) by mouth every 6 (six) hours. 12 tablet 0  . polyethylene glycol (MIRALAX / GLYCOLAX) packet Take 17 g by mouth daily.    Marland Kitchen PRESCRIPTION MEDICATION Take 1 Container by mouth 2 (two) times daily between meals. Med Pass     . QUEtiapine Fumarate (SEROQUEL XR) 150 MG 24 hr tablet Take 150 mg by mouth daily at 8 pm.    . Sennosides (SENNA LAX PO) Take 2 tablets by mouth 2 (two) times daily. Take 2 tablets in the morning and take 2 tablets at bedtime.    . travoprost, benzalkonium, (TRAVATAN) 0.004 % ophthalmic solution Place 1 drop into both eyes at bedtime.    . triamcinolone (NASACORT ALLERGY 24HR) 55 MCG/ACT AERO nasal inhaler Place 2 sprays into the nose daily as needed (allergies).    . triamcinolone cream (KENALOG) 0.1 % Apply 1 application topically 3 (three) times daily as needed (worsening symtoms then resume as needed).    . triazolam (HALCION) 0.25 MG tablet Take 0.25 mg by mouth at bedtime.    . pantoprazole (PROTONIX) 40 MG tablet Take 1 tablet (40 mg total) by mouth daily at 6 (six) AM. 30 tablet 2     Discharge Medications: Please see discharge summary for a list of discharge medications.  Relevant Imaging Results:  Relevant Lab Results:  Recent Labs    Additional Information SSN # [id number]  Taylor Roth

## 2015-01-14 NOTE — Discharge Summary (Signed)
Physician Discharge Summary  Taylor Roth [id number] DOB: 1929/02/08 DOA: 01/11/2015  PCP: Pcp Not In System Dr. Marjory Lies  Admit date: 01/11/2015 Discharge date: 01/14/2015   Recommendations for Outpatient Follow-Up:   1. PCP: Please follow up on biopsy specimens taken during EGD.   Discharge Diagnosis:   Principal Problem:    Dysphagia secondary to tortuosity of esophagus and Schatzki's ring s/p EGD Active Problems:    Anxiety    COPD (chronic obstructive pulmonary disease) (HCC)    GERD (gastroesophageal reflux disease)    Glaucoma    Esophagus disorder    Emesis    Weakness    Stage III CKD    Hypokalemia   Discharge disposition:  [facility]  Discharge Condition: Improved.  Diet recommendation: Soft, moist foods.   History of Present Illness:   Taylor Roth is an 81 y.o. female female with a past medical history of osteoporosis, depression, anxiety, COPD, GERD, migraines headaches, chronic kidney disease, chronic mental illness who was brought to the [facility] emergency department from [facility] due to complaints of "gagging and spitting up" mucus. She had a similar regurgitation episode that happened about 2 weeks ago that resolved on its own, but she states that she has been having this problem for about 2 years or so. She denies abdominal pain, nausea, emesis, diarrhea, melena or hematochezia. Per daughter, the patient gets frequent constipation.  Workup in the ER on 01/11/15 revealed a dilated distal esophagus on CT scan. She was admitted to the service for medical management and GI consultation.   Hospital Course by Problem:   Principal Problem:  Dysphasia secondary to tortuous esophagus, nonobstructive Schatzki's ring and angulation of the distal esophagus - EGD findings as noted. - Soft diet with moist foods. Continue PPI per GI recommendations. - Follow-up biopsies to rule out H. pylori.  Active Problems:  Hypokalemia - Given 40 mEq of potassium prior to D/C.   Anxiety - Resume Cymbalta and oral Ativan.   COPD (chronic obstructive pulmonary disease) ([place]) - Stable. Continue bronchodilators as needed.   GERD (gastroesophageal reflux disease) - Continue proton pump inhibitor.   Glaucoma - Continue Xalatan and Cosopt.    Medical Consultants:    Gastroenterology: Laurence Spates, MD   Discharge Exam:   Filed Vitals:   01/14/15 0447 01/14/15 0802  BP: 131/64   Pulse: 80 85  Temp: 98.5 F (36.9 C)   Resp: 20 15   Filed Vitals:   01/13/15 2114 01/13/15 2318 01/14/15 0447 01/14/15 0802  BP:  118/75 131/64   Pulse:  73 80 85  Temp:  99.1 F (37.3 C) 98.5 F (36.9 C)   TempSrc:  Oral Oral   Resp:  20 20 15   Height:      Weight:      SpO2: 93% 94% 93% 94%    Gen:  NAD Cardiovascular:  RRR, No M/R/G Respiratory: Lungs CTAB Gastrointestinal: Abdomen soft, NT/ND with normal active bowel sounds. Extremities: No C/E/C   The results of significant diagnostics from this hospitalization (including imaging, microbiology, ancillary and laboratory) are listed below for reference.     Procedures and Diagnostic Studies:   Dg Chest 2 View  01/12/2015  CLINICAL DATA:  History of pneumonia.  Esophageal narrowing. EXAM: CHEST  2 VIEW COMPARISON:  Barium swallow 01/11/2015.  Chest x-ray 03/30/2014. FINDINGS: Mediastinum and hilar structures normal. Heart size normal. Left base atelectasis and/or mild infiltrate. Stable elevation left hemidiaphragm. Fuse osteopenia degenerative change. IMPRESSION:  1. Left base subsegmental atelectasis and or mild infiltrate. 2. Stable elevation left hemidiaphragm . Electronically Signed   By: Teachey   On: 01/12/2015 08:24   Ct Abdomen Pelvis W Contrast  01/11/2015  CLINICAL DATA:  Nausea and vomiting.  Low abdominal cramping EXAM: CT ABDOMEN AND PELVIS WITH CONTRAST TECHNIQUE: Multidetector CT imaging of the abdomen and pelvis was  performed using the standard protocol following bolus administration of intravenous contrast. CONTRAST:  144mL OMNIPAQUE IOHEXOL 300 MG/ML  SOLN COMPARISON:  12/09/2012 FINDINGS: Lower chest and abdominal wall: The distal esophagus is distended with semi-solid material. Best visualized on coronal reformats there is contained appearing outpouching from the left aspect of the distal esophagus extending posterior. No surrounding fat edema or inflammatory enhancement. Small left pleural effusion and lower lobe atelectasis. Hepatobiliary: No focal liver abnormality.Chronic intrahepatic and extrahepatic bile duct enlargement post cholecystectomy. No evidence of calcified choledocholithiasis. Noted liver function tests. Pancreas: Unremarkable. Spleen: Unremarkable. Adrenals/Urinary Tract: Negative adrenals. No hydronephrosis or stone. Unremarkable bladder. Reproductive:Hysterectomy. Stable 2 cm right adnexal cyst, considered benign given long interval and size. Stomach/Bowel: Prominent enhancement at the gastric cardia without focal masslike appearance. There could be stricture given distal esophagus appearance. Large volume colonic stool without small bowel or colonic obstruction. No appendicitis. Vascular/Lymphatic: No acute vascular abnormality. No mass or adenopathy. Peritoneal: No ascites or pneumoperitoneum. Musculoskeletal: Lumbar levoscoliosis with multilevel advanced disc and facet degeneration. These results were called by telephone at the time of interpretation on 01/11/2015 at 5:09 pm to Dr. Brantley Stage , who verbally acknowledged these results. IMPRESSION: 1. Distended lower esophagus which could be from gastroesophageal reflux/vomiting or distal esophageal stricture. Gas around the lower esophagus favors epiphrenic diverticulum over extraluminal gas, but was not seen in 2014. If esophageal rupture is a clinical possibility, esophagram could be obtained. 2. No definitive explanation for lower abdominal pain. 3.  Small left pleural effusion. 4. Large stool volume. Electronically Signed   By: Monte Fantasia M.D.   On: 01/11/2015 17:12   Dg Esophagus W/water Sol Cm  01/11/2015  CLINICAL DATA:  81 year old female with nausea. Question obstruction versus esophageal leak. EXAM: ESOPHOGRAM/BARIUM SWALLOW TECHNIQUE: Single contrast examination was performed using 10 cc water-soluble. FLUOROSCOPY TIME:  If the device does not provide the exposure index: Fluoroscopy Time:  2 minutes and 31 seconds. Number of Acquired Images: 21 images with combination of last image hold and spot films. COMPARISON:  CT earlier in the day. FINDINGS: The patient was only able to ingest 10 cc of contrast. This revealed prominent retained contents within the esophagus with smooth narrowing distal esophagus above what may be a small hiatal hernia. Contrast did not traverse beyond this region with change of patient position and delayed imaging. This suggest obstructing lesion. No leak identified although contrast did not traverse beyond the hiatus. Results were discussed with Dr. Dolly Rias. Patient is to be kept with head elevated 25 degrees for the next few hours. GI consultation may be considered. IMPRESSION: Prominent retained contents within the esophagus with smooth narrowing distal esophagus above what may be a small hiatal hernia. Contrast did not traverse beyond this region suggesting obstructing lesion. No leak identified although contrast did not traverse beyond the hiatus. Results were discussed with Dr. Dolly Rias. Patient is to be kept with head elevated 25 degrees for the next few hours. GI consultation may be considered. Electronically Signed   By: Genia Del M.D.   On: 01/11/2015 18:28   EGD  01/13/15  Findings  Esophagus: The esophagus was very tortuous. There was no food in the esophagus. There was a nonobstructing Schatzki's ring. There was angulation of the distal esophagus just before entering the stomach as seen  in image 9creating what looks to be a pocketat the distal esophagus.  Stomach;Diffuse erythema. Linear ulceration and pyloric channel biopsied.  Duodenum: Duodenal bulb looks normal.   ENDOSCOPIC IMPRESSION:I do not see an obvious stricture to dilate. It could be that food is getting caught in the pocket just beforeit enters into the stomach as it seems to [facility] acute angulation as seen in image 009. I would recommend softmoist foods.I would continue pantoprazole 40 mg daily. Check Biopsies primarily for H. pylori.  Labs:   Basic Metabolic Panel:  Recent Labs Lab 01/11/15 1430 01/14/15 0530  NA 142 140  K 3.7 3.2*  CL 108 108  CO2 28 26  GLUCOSE 105* 106*  BUN 19 13  CREATININE 0.89 0.75  CALCIUM 8.7* 8.5*   GFR Estimated Creatinine Clearance: 41.4 mL/min (by C-G formula based on Cr of 0.75). Liver Function Tests:  Recent Labs Lab 01/11/15 1430  AST 27  ALT 16  ALKPHOS 118  BILITOT 0.1*  PROT 6.6  ALBUMIN 3.9    Recent Labs Lab 01/11/15 1430  LIPASE 23   CBC:  Recent Labs Lab 01/11/15 1430 01/14/15 0530  WBC 4.9 6.3  NEUTROABS 2.5  --   HGB 12.1 11.4*  HCT 37.7 34.8*  MCV 93.1 92.3  PLT 221 180     Discharge Instructions:       Discharge Instructions    Call MD for:  persistant nausea and vomiting    Complete by:  As directed      Call MD for:  severe uncontrolled pain    Complete by:  As directed      Call MD for:    Complete by:  As directed   Persistent trouble swallowing.     Diet general    Complete by:  As directed   Soft, moist foods.     Face-to-face encounter (required for Medicare/Medicaid patients)    Complete by:  As directed   I RAMA,CHRISTINA certify that this patient is under my care and that I, or a nurse practitioner or physician's assistant working with me, had a face-to-face encounter that meets the physician face-to-face encounter requirements with this patient on 01/14/2015. The encounter with the patient was  in whole, or in part for the following medical condition(s) which is the primary reason for home health care (List medical condition): Deconditioning & weakness post hospitalization for evaluation of dysphagia.  The encounter with the patient was in whole, or in part, for the following medical condition, which is the primary reason for home health care:  Deconditioning  I certify that, based on my findings, the following services are medically necessary home health services:  Nursing  Reason for [place]:  Therapy- Therapeutic Exercises to Increase Strength and Endurance  My clinical findings support the need for the above services:  Unable to leave home safely without assistance and/or assistive device  Further, I certify that my clinical findings support that this patient is homebound due to:  Unable to leave home safely without assistance     Home Health    Complete by:  As directed   To provide the following care/treatments:  PT     Increase activity slowly    Complete by:  As directed  Medication List    STOP taking these medications        cephALEXin 500 MG capsule  Commonly known as:  KEFLEX     ranitidine 150 MG tablet  Commonly known as:  ZANTAC      TAKE these medications        acetaminophen 325 MG tablet  Commonly known as:  TYLENOL  Take 650 mg by mouth every 6 (six) hours as needed for headache.     aspirin 81 MG chewable tablet  Chew 1 tablet (81 mg total) by mouth daily.     clorazepate 7.5 MG tablet  Commonly known as:  TRANXENE  Take 7.5 mg by mouth 2 (two) times daily.     Cranberry 425 MG Caps  Take 1 capsule by mouth 2 (two) times daily.     diclofenac 1.3 % Ptch  Commonly known as:  FLECTOR  Place 1 patch onto the skin every 12 (twelve) hours as needed (For localized pain.).     divalproex 250 MG DR tablet  Commonly known as:  DEPAKOTE  Take 250 mg by mouth daily.     docusate sodium 100 MG capsule    Commonly known as:  COLACE  Take 100 mg by mouth 2 (two) times daily.     dorzolamide-timolol 22.3-6.8 MG/ML ophthalmic solution  Commonly known as:  COSOPT  Place 1 drop into both eyes 2 (two) times daily.     DOUCHE VINEGAR/WATER VA  Place 1 each around the anus once a week. To prevent UTI     DULoxetine 30 MG capsule  Commonly known as:  CYMBALTA  Take 30 mg by mouth daily.     fluconazole 100 MG tablet  Commonly known as:  DIFLUCAN  Take 100 mg by mouth daily.     fludrocortisone 0.1 MG tablet  Commonly known as:  FLORINEF  Take 0.1 mg by mouth every morning.     fluticasone 110 MCG/ACT inhaler  Commonly known as:  FLOVENT HFA  Inhale 1 puff into the lungs 2 (two) times daily as needed (shortness of breath.).     FLUZONE HIGH-DOSE 0.5 ML Susy  Generic drug:  Influenza Vac Split High-Dose  ADM 0.5ML IM UTD     Ginkgo Biloba 120 MG Caps  Take 120 mg by mouth 2 (two) times daily.     HYDROcodone-acetaminophen 10-325 MG tablet  Commonly known as:  NORCO  Take 1 tablet by mouth every 6 (six) hours as needed (pain).     hydrocortisone 2.5 % rectal cream  Commonly known as:  ANUSOL-HC  Place 1 application rectally 2 (two) times daily as needed for hemorrhoids or itching.     LORazepam 0.5 MG tablet  Commonly known as:  ATIVAN  Take 0.25-0.5 mg by mouth at bedtime. Take half a tablet nightly at bedtime. Take a full tablet twice daily as needed for anxiety     meclizine 25 MG tablet  Commonly known as:  ANTIVERT  Take 25 mg by mouth 3 (three) times daily as needed for dizziness. Do not combine with Hydroxyzine or any other antihistamine.     Melatonin 1 MG Tabs  Take 1 mg by mouth at bedtime.     mineral oil-hydrophilic petrolatum ointment  Apply 1 application topically daily.     mirtazapine 45 MG tablet  Commonly known as:  REMERON  Take 45 mg by mouth at bedtime.     NASACORT ALLERGY 24HR 55 MCG/ACT Aero nasal inhaler  Generic  drug:  triamcinolone  Place 2  sprays into the nose daily as needed (allergies).     nitroGLYCERIN 0.4 MG SL tablet  Commonly known as:  NITROSTAT  Place 0.4 mg under the tongue every 5 (five) minutes as needed for chest pain. May use 3 times     nystatin 100000 UNIT/ML suspension  Commonly known as:  MYCOSTATIN  Take 5 mLs by mouth 3 (three) times daily as needed.     ondansetron 4 MG tablet  Commonly known as:  ZOFRAN  Take 1 tablet (4 mg total) by mouth every 6 (six) hours.     pantoprazole 40 MG tablet  Commonly known as:  PROTONIX  Take 1 tablet (40 mg total) by mouth daily at 6 (six) AM.     polyethylene glycol packet  Commonly known as:  MIRALAX / GLYCOLAX  Take 17 g by mouth daily.     PRESCRIPTION MEDICATION  Take 1 Container by mouth 2 (two) times daily between meals. Med Pass     QUEtiapine Fumarate 150 MG 24 hr tablet  Commonly known as:  SEROQUEL XR  Take 150 mg by mouth daily at 8 pm.     SENNA LAX PO  Take 2 tablets by mouth 2 (two) times daily. Take 2 tablets in the morning and take 2 tablets at bedtime.     travoprost (benzalkonium) 0.004 % ophthalmic solution  Commonly known as:  TRAVATAN  Place 1 drop into both eyes at bedtime.     triamcinolone cream 0.1 %  Commonly known as:  KENALOG  Apply 1 application topically 3 (three) times daily as needed (worsening symtoms then resume as needed).     triazolam 0.25 MG tablet  Commonly known as:  HALCION  Take 0.25 mg by mouth at bedtime.     Vitamin D 1000 UNITS capsule  Take 1,000 Units by mouth daily.       Follow-up Information    Follow up with [facility].   Specialty:  Emergency Medicine   Why:  If symptoms worsen   Contact information:   [id number] [place] [phone]      Follow up with Dr. Marjory Lies In 1 week.   Why:  Hospital follow up.       Time coordinating discharge: 35 minutes.  Signed:  RAMA,CHRISTINA  Pager  [phone] [name] 01/14/2015, 9:09 AM

## 2015-01-14 NOTE — Progress Notes (Signed)
Pt was d/c'd to facility prior to CSW completing assessment.   CSW contacted [facility] asking if facility would need any additional information.   FL2 needed.    Pete Pelt [facility] [place] [phone]

## 2015-01-14 NOTE — Progress Notes (Signed)
Taylor Roth to be D/C'd Nursing Home per MD order.  Discussed prescriptions and follow up appointments with the patient. Prescriptions given to patient, medication list explained in detail. Pt verbalized understanding.    Medication List    STOP taking these medications        cephALEXin 500 MG capsule  Commonly known as:  KEFLEX     ranitidine 150 MG tablet  Commonly known as:  ZANTAC      TAKE these medications        acetaminophen 325 MG tablet  Commonly known as:  TYLENOL  Take 650 mg by mouth every 6 (six) hours as needed for headache.     aspirin 81 MG chewable tablet  Chew 1 tablet (81 mg total) by mouth daily.     clorazepate 7.5 MG tablet  Commonly known as:  TRANXENE  Take 7.5 mg by mouth 2 (two) times daily.     Cranberry 425 MG Caps  Take 1 capsule by mouth 2 (two) times daily.     diclofenac 1.3 % Ptch  Commonly known as:  FLECTOR  Place 1 patch onto the skin every 12 (twelve) hours as needed (For localized pain.).     divalproex 250 MG DR tablet  Commonly known as:  DEPAKOTE  Take 250 mg by mouth daily.     docusate sodium 100 MG capsule  Commonly known as:  COLACE  Take 100 mg by mouth 2 (two) times daily.     dorzolamide-timolol 22.3-6.8 MG/ML ophthalmic solution  Commonly known as:  COSOPT  Place 1 drop into both eyes 2 (two) times daily.     DOUCHE VINEGAR/WATER VA  Place 1 each around the anus once a week. To prevent UTI     DULoxetine 30 MG capsule  Commonly known as:  CYMBALTA  Take 30 mg by mouth daily.     fluconazole 100 MG tablet  Commonly known as:  DIFLUCAN  Take 100 mg by mouth daily.     fludrocortisone 0.1 MG tablet  Commonly known as:  FLORINEF  Take 0.1 mg by mouth every morning.     fluticasone 110 MCG/ACT inhaler  Commonly known as:  FLOVENT HFA  Inhale 1 puff into the lungs 2 (two) times daily as needed (shortness of breath.).     FLUZONE HIGH-DOSE 0.5 ML Susy  Generic drug:  Influenza Vac Split High-Dose  ADM  0.5ML IM UTD     Ginkgo Biloba 120 MG Caps  Take 120 mg by mouth 2 (two) times daily.     HYDROcodone-acetaminophen 10-325 MG tablet  Commonly known as:  NORCO  Take 1 tablet by mouth every 6 (six) hours as needed (pain).     hydrocortisone 2.5 % rectal cream  Commonly known as:  ANUSOL-HC  Place 1 application rectally 2 (two) times daily as needed for hemorrhoids or itching.     LORazepam 0.5 MG tablet  Commonly known as:  ATIVAN  Take 0.25-0.5 mg by mouth at bedtime. Take half a tablet nightly at bedtime. Take a full tablet twice daily as needed for anxiety     meclizine 25 MG tablet  Commonly known as:  ANTIVERT  Take 25 mg by mouth 3 (three) times daily as needed for dizziness. Do not combine with Hydroxyzine or any other antihistamine.     Melatonin 1 MG Tabs  Take 1 mg by mouth at bedtime.     mineral oil-hydrophilic petrolatum ointment  Apply 1 application topically daily.  mirtazapine 45 MG tablet  Commonly known as:  REMERON  Take 45 mg by mouth at bedtime.     NASACORT ALLERGY 24HR 55 MCG/ACT Aero nasal inhaler  Generic drug:  triamcinolone  Place 2 sprays into the nose daily as needed (allergies).     nitroGLYCERIN 0.4 MG SL tablet  Commonly known as:  NITROSTAT  Place 0.4 mg under the tongue every 5 (five) minutes as needed for chest pain. May use 3 times     nystatin 100000 UNIT/ML suspension  Commonly known as:  MYCOSTATIN  Take 5 mLs by mouth 3 (three) times daily as needed.     ondansetron 4 MG tablet  Commonly known as:  ZOFRAN  Take 1 tablet (4 mg total) by mouth every 6 (six) hours.     pantoprazole 40 MG tablet  Commonly known as:  PROTONIX  Take 1 tablet (40 mg total) by mouth daily at 6 (six) AM.     polyethylene glycol packet  Commonly known as:  MIRALAX / GLYCOLAX  Take 17 g by mouth daily.     PRESCRIPTION MEDICATION  Take 1 Container by mouth 2 (two) times daily between meals. Med Pass     QUEtiapine Fumarate 150 MG 24 hr tablet   Commonly known as:  SEROQUEL XR  Take 150 mg by mouth daily at 8 pm.     SENNA LAX PO  Take 2 tablets by mouth 2 (two) times daily. Take 2 tablets in the morning and take 2 tablets at bedtime.     travoprost (benzalkonium) 0.004 % ophthalmic solution  Commonly known as:  TRAVATAN  Place 1 drop into both eyes at bedtime.     triamcinolone cream 0.1 %  Commonly known as:  KENALOG  Apply 1 application topically 3 (three) times daily as needed (worsening symtoms then resume as needed).     triazolam 0.25 MG tablet  Commonly known as:  HALCION  Take 0.25 mg by mouth at bedtime.     Vitamin D 1000 UNITS capsule  Take 1,000 Units by mouth daily.        Filed Vitals:   01/14/15 0447 01/14/15 0802  BP: 131/64   Pulse: 80 85  Temp: 98.5 F (36.9 C)   Resp: 20 15    Skin clean, dry and intact without evidence of skin break down, no evidence of skin tears noted. IV catheter discontinued intact. Site without signs and symptoms of complications. Dressing and pressure applied. Pt denies pain at this time. No complaints noted.  An After Visit Summary was printed and given to the patient. Patient escorted via [place], and D/C home via private auto.  Lolita Rieger 01/14/2015 12:53 PM

## 2015-01-14 NOTE — Anesthesia Postprocedure Evaluation (Signed)
Anesthesia Post Note  Patient: Taylor Roth  Procedure(s) Performed: Procedure(s) (LRB): ESOPHAGOGASTRODUODENOSCOPY (EGD) WITH PROPOFOL (N/A)  Patient location during evaluation: PACU Anesthesia Type: MAC Level of consciousness: awake and alert Pain management: pain level controlled Vital Signs Assessment: post-procedure vital signs reviewed and stable Respiratory status: spontaneous breathing, nonlabored ventilation, respiratory function stable and patient connected to nasal cannula oxygen Cardiovascular status: stable and blood pressure returned to baseline Anesthetic complications: no    Last Vitals:  Filed Vitals:   01/14/15 0447 01/14/15 0802  BP: 131/64   Pulse: 80 85  Temp: 36.9 C   Resp: 20 15    Last Pain:  Filed Vitals:   01/14/15 0804  PainSc: [place]

## 2015-01-16 ENCOUNTER — Inpatient Hospital Stay (HOSPITAL_COMMUNITY)
Admission: EM | Admit: 2015-01-16 | Discharge: 2015-01-19 | DRG: 177 | Disposition: A | Discharge status: Home Health | Payer: Medicare Other | Attending: Internal Medicine | Admitting: Internal Medicine

## 2015-01-16 ENCOUNTER — Emergency Department (HOSPITAL_COMMUNITY): Payer: Medicare Other

## 2015-01-16 DIAGNOSIS — Z8249 Family history of ischemic heart disease and other diseases of the circulatory system: Secondary | ICD-10-CM | POA: Diagnosis not present

## 2015-01-16 DIAGNOSIS — Z87440 Personal history of urinary (tract) infections: Secondary | ICD-10-CM | POA: Diagnosis not present

## 2015-01-16 DIAGNOSIS — E876 Hypokalemia: Secondary | ICD-10-CM | POA: Diagnosis present

## 2015-01-16 DIAGNOSIS — B9689 Other specified bacterial agents as the cause of diseases classified elsewhere: Secondary | ICD-10-CM | POA: Diagnosis present

## 2015-01-16 DIAGNOSIS — R1310 Dysphagia, unspecified: Secondary | ICD-10-CM

## 2015-01-16 DIAGNOSIS — Z886 Allergy status to analgesic agent status: Secondary | ICD-10-CM | POA: Diagnosis not present

## 2015-01-16 DIAGNOSIS — W19XXXA Unspecified fall, initial encounter: Secondary | ICD-10-CM | POA: Diagnosis present

## 2015-01-16 DIAGNOSIS — Z882 Allergy status to sulfonamides status: Secondary | ICD-10-CM

## 2015-01-16 DIAGNOSIS — F329 Major depressive disorder, single episode, unspecified: Secondary | ICD-10-CM | POA: Diagnosis present

## 2015-01-16 DIAGNOSIS — Z91040 Latex allergy status: Secondary | ICD-10-CM

## 2015-01-16 DIAGNOSIS — J449 Chronic obstructive pulmonary disease, unspecified: Secondary | ICD-10-CM | POA: Diagnosis present

## 2015-01-16 DIAGNOSIS — M810 Age-related osteoporosis without current pathological fracture: Secondary | ICD-10-CM | POA: Diagnosis present

## 2015-01-16 DIAGNOSIS — B952 Enterococcus as the cause of diseases classified elsewhere: Secondary | ICD-10-CM | POA: Diagnosis present

## 2015-01-16 DIAGNOSIS — Z885 Allergy status to narcotic agent status: Secondary | ICD-10-CM | POA: Diagnosis not present

## 2015-01-16 DIAGNOSIS — K224 Dyskinesia of esophagus: Secondary | ICD-10-CM | POA: Insufficient documentation

## 2015-01-16 DIAGNOSIS — H409 Unspecified glaucoma: Secondary | ICD-10-CM | POA: Diagnosis present

## 2015-01-16 DIAGNOSIS — Z87891 Personal history of nicotine dependence: Secondary | ICD-10-CM | POA: Diagnosis not present

## 2015-01-16 DIAGNOSIS — Z888 Allergy status to other drugs, medicaments and biological substances status: Secondary | ICD-10-CM | POA: Diagnosis not present

## 2015-01-16 DIAGNOSIS — M419 Scoliosis, unspecified: Secondary | ICD-10-CM | POA: Diagnosis present

## 2015-01-16 DIAGNOSIS — R509 Fever, unspecified: Secondary | ICD-10-CM | POA: Insufficient documentation

## 2015-01-16 DIAGNOSIS — N184 Chronic kidney disease, stage 4 (severe): Secondary | ICD-10-CM | POA: Diagnosis present

## 2015-01-16 DIAGNOSIS — Z881 Allergy status to other antibiotic agents status: Secondary | ICD-10-CM | POA: Diagnosis not present

## 2015-01-16 DIAGNOSIS — M542 Cervicalgia: Secondary | ICD-10-CM | POA: Diagnosis present

## 2015-01-16 DIAGNOSIS — K220 Achalasia of cardia: Secondary | ICD-10-CM | POA: Diagnosis present

## 2015-01-16 DIAGNOSIS — N183 Chronic kidney disease, stage 3 unspecified: Secondary | ICD-10-CM | POA: Diagnosis present

## 2015-01-16 DIAGNOSIS — R627 Adult failure to thrive: Secondary | ICD-10-CM | POA: Diagnosis present

## 2015-01-16 DIAGNOSIS — J9601 Acute respiratory failure with hypoxia: Secondary | ICD-10-CM | POA: Diagnosis present

## 2015-01-16 DIAGNOSIS — K219 Gastro-esophageal reflux disease without esophagitis: Secondary | ICD-10-CM | POA: Diagnosis present

## 2015-01-16 DIAGNOSIS — Z7982 Long term (current) use of aspirin: Secondary | ICD-10-CM | POA: Diagnosis not present

## 2015-01-16 DIAGNOSIS — K222 Esophageal obstruction: Secondary | ICD-10-CM | POA: Diagnosis present

## 2015-01-16 DIAGNOSIS — N390 Urinary tract infection, site not specified: Secondary | ICD-10-CM | POA: Insufficient documentation

## 2015-01-16 DIAGNOSIS — Z800 Family history of malignant neoplasm of digestive organs: Secondary | ICD-10-CM

## 2015-01-16 DIAGNOSIS — Y92129 Unspecified place in nursing home as the place of occurrence of the external cause: Secondary | ICD-10-CM | POA: Diagnosis not present

## 2015-01-16 DIAGNOSIS — Z822 Family history of deafness and hearing loss: Secondary | ICD-10-CM

## 2015-01-16 DIAGNOSIS — Z833 Family history of diabetes mellitus: Secondary | ICD-10-CM | POA: Diagnosis not present

## 2015-01-16 DIAGNOSIS — Z79899 Other long term (current) drug therapy: Secondary | ICD-10-CM

## 2015-01-16 DIAGNOSIS — I129 Hypertensive chronic kidney disease with stage 1 through stage 4 chronic kidney disease, or unspecified chronic kidney disease: Secondary | ICD-10-CM | POA: Diagnosis present

## 2015-01-16 DIAGNOSIS — F419 Anxiety disorder, unspecified: Secondary | ICD-10-CM | POA: Diagnosis present

## 2015-01-16 DIAGNOSIS — R112 Nausea with vomiting, unspecified: Secondary | ICD-10-CM | POA: Diagnosis present

## 2015-01-16 DIAGNOSIS — J690 Pneumonitis due to inhalation of food and vomit: Principal | ICD-10-CM | POA: Diagnosis present

## 2015-01-16 DIAGNOSIS — G9341 Metabolic encephalopathy: Secondary | ICD-10-CM | POA: Diagnosis present

## 2015-01-16 DIAGNOSIS — Z823 Family history of stroke: Secondary | ICD-10-CM | POA: Diagnosis not present

## 2015-01-16 DIAGNOSIS — M25552 Pain in left hip: Secondary | ICD-10-CM | POA: Diagnosis present

## 2015-01-16 DIAGNOSIS — R1319 Other dysphagia: Secondary | ICD-10-CM

## 2015-01-16 LAB — CBC WITH DIFFERENTIAL/PLATELET
BASOS ABS: 0 10*3/uL (ref 0.0–0.1)
BASOS PCT: 0 %
EOS ABS: 0.2 10*3/uL (ref 0.0–0.7)
Eosinophils Relative: 2 %
HEMATOCRIT: 38.6 % (ref 36.0–46.0)
Hemoglobin: 12.2 g/dL (ref 12.0–15.0)
LYMPHS ABS: 1.1 10*3/uL (ref 0.7–4.0)
LYMPHS PCT: 14 %
MCH: 29.4 pg (ref 26.0–34.0)
MCHC: 31.6 g/dL (ref 30.0–36.0)
MCV: 93 fL (ref 78.0–100.0)
MONOS PCT: 8 %
Monocytes Absolute: 0.6 10*3/uL (ref 0.1–1.0)
NEUTROS ABS: 6 10*3/uL (ref 1.7–7.7)
Neutrophils Relative %: 76 %
Platelets: 206 10*3/uL (ref 150–400)
RBC: 4.15 MIL/uL (ref 3.87–5.11)
RDW: 14.5 % (ref 11.5–15.5)
WBC: 7.9 10*3/uL (ref 4.0–10.5)

## 2015-01-16 LAB — COMPREHENSIVE METABOLIC PANEL
ALBUMIN: 3.2 g/dL — AB (ref 3.5–5.0)
ALT: 16 U/L (ref 14–54)
ANION GAP: 8 (ref 5–15)
AST: 23 U/L (ref 15–41)
Alkaline Phosphatase: 84 U/L (ref 38–126)
BILIRUBIN TOTAL: 0.5 mg/dL (ref 0.3–1.2)
BUN: 17 mg/dL (ref 6–20)
CALCIUM: 8.5 mg/dL — AB (ref 8.9–10.3)
CO2: 27 mmol/L (ref 22–32)
Chloride: 108 mmol/L (ref 101–111)
Creatinine, Ser: 0.66 mg/dL (ref 0.44–1.00)
GFR calc non Af Amer: >60 mL/min (ref >60)
GLUCOSE: 134 mg/dL — AB (ref 65–99)
POTASSIUM: 3 mmol/L — AB (ref 3.5–5.1)
SODIUM: 143 mmol/L (ref 135–145)
TOTAL PROTEIN: 6.4 g/dL — AB (ref 6.5–8.1)

## 2015-01-16 LAB — LIPASE, BLOOD: LIPASE: 23 U/L (ref 11–51)

## 2015-01-16 MED ORDER — PANTOPRAZOLE SODIUM 40 MG IV SOLR
40.0000 mg | INTRAVENOUS | Status: DC
  Administered 2015-01-17 – 2015-01-18 (×3): 40 mg via INTRAVENOUS
  Filled 2015-01-16 (×3): qty 40

## 2015-01-16 MED ORDER — POTASSIUM CHLORIDE CRYS ER 20 MEQ PO TBCR
40.0000 meq | EXTENDED_RELEASE_TABLET | Freq: Once | ORAL | Status: AC
  Administered 2015-01-17: 40 meq via ORAL
  Filled 2015-01-16: qty 2

## 2015-01-16 MED ORDER — SODIUM CHLORIDE 0.9 % IV BOLUS (SEPSIS)
500.0000 mL | Freq: Once | INTRAVENOUS | Status: AC
Start: 2015-01-16 — End: 2015-01-17
  Administered 2015-01-17: 500 mL via INTRAVENOUS

## 2015-01-16 MED ORDER — ONDANSETRON HCL 4 MG/2ML IJ SOLN: 4.0000 mg | Freq: Four times a day (QID) | INTRAMUSCULAR | Status: DC | PRN

## 2015-01-16 MED ORDER — POTASSIUM CHLORIDE 10 MEQ/100ML IV SOLN
10.0000 meq | Freq: Once | INTRAVENOUS | Status: AC
  Administered 2015-01-17: 10 meq via INTRAVENOUS
  Filled 2015-01-16: qty 100

## 2015-01-16 NOTE — ED Notes (Signed)
Pt walked to the restroom with stand by assist, unable to urinate at this time

## 2015-01-16 NOTE — ED Notes (Signed)
Patient is resting comfortably. 

## 2015-01-16 NOTE — ED Notes (Signed)
Bed: WHALD Expected date:  Expected time:  Means of arrival:  Comments: 

## 2015-01-16 NOTE — ED Notes (Signed)
Bed: WA20 Expected date: 01/16/15 Expected time: 6:50 PM Means of arrival: Ambulance Comments: N/V Rm 20

## 2015-01-16 NOTE — ED Notes (Signed)
Below order not completed by EW. 

## 2015-01-16 NOTE — ED Notes (Signed)
Patient transported to X-ray 

## 2015-01-16 NOTE — ED Provider Notes (Signed)
CSN: [id number]     Arrival date & time 01/16/15  1848 History   First MD Initiated Contact with Patient 01/16/15 1907     Chief Complaint  Patient presents with  . Emesis   HPI  Taylor Roth is an 81 year old female with PMHx of COPD, GERD, migraines, CKD and dysphagia presenting with nausea and vomiting. She was evaluated in the emergency room 5 days ago for the same complaints and admitted to the hospital for GI workup. While in the hospital, she underwent and was found have a tortuous esophagus with Schatzki's ring that was nonobstructive. She is discharged home with symptomatic care and instruction to follow up with her gastroenterologist for gastric biopsies for possible H. pylori. She states she has had no change in her symptoms since discharge from the hospital 2 days ago. She states that she is still nauseous and continues to vomit. She states that she vomits after meals and in between meals now. Chart review shows that she has been evaluated for nausea and vomiting multiple times in the past few years. She also notes that she had a fall yesterday at her nursing facility. She states that she was helped up immediately afterwards but is having residual left hip pain. She states that she has been able to ambulate at baseline. Denies hitting her head or loss of consciousness with the fall. Denies fevers, chills, chest pain, shortness of breath, cough, abdominal pain, dysuria, urinary frequency or numbness/weakness in the extremities.  Past Medical History  Diagnosis Date  . Osteoporosis   . Depression   . Anxiety   . COPD (chronic obstructive pulmonary disease) ([place])   . GERD (gastroesophageal reflux disease)   . Abuse     benzos/narcotics  . Migraines   . Scoliosis   . Insomnia, unspecified   . Vitamin B deficiency   . Weight loss   . Chronic mental illness   . Arterial tortuosity (aorta) 12/11/2012  . Chronic kidney disease, stage IV (severe) ([place]) 12/11/2012  . Substance abuse      Benzos and Narcotics, she does not get any of those medicines from [facility], we have empahtically told her that.   . Vertigo   . Colitis   . Schatzki's ring 01/14/2015  . Dysphagia 01/11/2015  . Nasal bone fracture 03/30/2014   Past Surgical History  Procedure Laterality Date  . Cholecystectomy    . Partial hysterectomy    . Abdominal hysterectomy    . Cataract extraction    . Esophagogastroduodenoscopy (egd) with propofol N/A 01/13/2015    Procedure: ESOPHAGOGASTRODUODENOSCOPY (EGD) WITH PROPOFOL;  Surgeon: [name], MD;  Location: [facility];  Service: Endoscopy;  Laterality: N/A;   Family History  Problem Relation Age of Onset  . Stroke Mother   . Diabetes Mother   . Cancer Father 78    esophageal  . Heart disease Mother   . Hearing loss Mother    Social History  Substance Use Topics  . Smoking status: Former Smoker -- 0.50 packs/day    Types: Cigarettes  . Smokeless tobacco: Never Used  . Alcohol Use: No   OB History    No data available     Review of Systems  Gastrointestinal: Positive for nausea and vomiting. Negative for abdominal pain.  Musculoskeletal: Positive for arthralgias.  All other systems reviewed and are negative.     Allergies  Macrobid; Azithromycin; Doxycycline; Escitalopram oxalate; Fioricet; Latex; Levofloxacin; Nsaids; Oxycodone; Restasis; Sulfa antibiotics; Tylenol; and Biaxin  Home  Medications   Prior to Admission medications   Medication Sig Start Date End Date Taking? Authorizing Provider  acetaminophen (TYLENOL) 325 MG tablet Take 650 mg by mouth every 6 (six) hours as needed for headache.   Yes Historical Provider, MD  Acetic Acid (DOUCHE VINEGAR/WATER VA) Place 1 each around the anus once a week. To prevent UTI   Yes Historical Provider, MD  aspirin 81 MG chewable tablet Chew 1 tablet (81 mg total) by mouth daily. 12/12/12  Yes [name], MD  Cholecalciferol (VITAMIN D) 1000 UNITS capsule Take 1,000 Units by mouth daily.    Yes Historical Provider, MD  clorazepate (TRANXENE) 7.5 MG tablet Take 7.5 mg by mouth 2 (two) times daily.  12/23/12  Yes Estill Dooms, MD  Cranberry 425 MG CAPS Take 425 mg by mouth 2 (two) times daily.    Yes Historical Provider, MD  diclofenac (FLECTOR) 1.3 % PTCH Place 1 patch onto the skin every 12 (twelve) hours as needed (For localized pain.).    Yes Historical Provider, MD  divalproex (DEPAKOTE) 250 MG DR tablet Take 250 mg by mouth daily.   Yes Historical Provider, MD  docusate sodium (COLACE) 100 MG capsule Take 100 mg by mouth 2 (two) times daily.   Yes Historical Provider, MD  dorzolamide-timolol (COSOPT) 22.3-6.8 MG/ML ophthalmic solution Place 1 drop into both eyes 2 (two) times daily.   Yes Historical Provider, MD  DULoxetine (CYMBALTA) 30 MG capsule Take 30 mg by mouth daily.   Yes Historical Provider, MD  fluconazole (DIFLUCAN) 100 MG tablet Take 100 mg by mouth daily.   Yes Historical Provider, MD  fludrocortisone (FLORINEF) 0.1 MG tablet Take 0.1 mg by mouth every morning.   Yes Historical Provider, MD  fluticasone (FLOVENT HFA) 110 MCG/ACT inhaler Inhale 1 puff into the lungs 2 (two) times daily as needed (shortness of breath.).   Yes Historical Provider, MD  Ginkgo Biloba 120 MG CAPS Take 120 mg by mouth 2 (two) times daily.    Yes Historical Provider, MD  HYDROcodone-acetaminophen (NORCO) 10-325 MG per tablet Take 1 tablet by mouth every 6 (six) hours as needed (pain). Patient taking differently: Take 1 tablet by mouth every 8 (eight) hours. Scheduled at 6am, 12 noon, and 6pm. And may take 1 tablet at 12 midnight as needed for pain 04/02/14  Yes Theodis Blaze, MD  hydrocortisone (ANUSOL-HC) 2.5 % rectal cream Place 1 application rectally 2 (two) times daily as needed for hemorrhoids or itching.   Yes Historical Provider, MD  LORazepam (ATIVAN) 0.5 MG tablet Take 0.5 mg by mouth daily. May also take a full tablet twice daily as needed for anxiety   Yes Historical Provider, MD   meclizine (ANTIVERT) 25 MG tablet Take 25 mg by mouth 3 (three) times daily as needed for dizziness. Do not combine with Hydroxyzine or any other antihistamine. 11/12/13  Yes Thao P Le, DO  Melatonin 1 MG TABS Take 1 mg by mouth at bedtime.    Yes Historical Provider, MD  mineral oil-hydrophilic petrolatum (AQUAPHOR) ointment Apply 1 application topically daily.   Yes Historical Provider, MD  mirtazapine (REMERON) 45 MG tablet Take 45 mg by mouth at bedtime.   Yes Historical Provider, MD  nitroGLYCERIN (NITROSTAT) 0.4 MG SL tablet Place 0.4 mg under the tongue every 5 (five) minutes as needed for chest pain. May use 3 times   Yes Historical Provider, MD  nystatin (MYCOSTATIN) 100000 UNIT/ML suspension Take 5 mLs by mouth 3 (three)  times daily as needed (mouth pain).    Yes Historical Provider, MD  omeprazole (PRILOSEC) 20 MG capsule Take 20 mg by mouth daily.   Yes Historical Provider, MD  ondansetron (ZOFRAN) 4 MG tablet Take 1 tablet (4 mg total) by mouth every 6 (six) hours. Patient taking differently: Take 4 mg by mouth every 4 (four) hours as needed for nausea or vomiting.  12/25/14  Yes Hanna Patel-Mills, PA-C  polyethylene glycol (MIRALAX / GLYCOLAX) packet Take 17 g by mouth daily as needed for mild constipation or moderate constipation.    Yes Historical Provider, MD  PRESCRIPTION MEDICATION Take 1 Container by mouth 2 (two) times daily between meals. Med Pass   Yes Historical Provider, MD  QUEtiapine Fumarate (SEROQUEL XR) 150 MG 24 hr tablet Take 150 mg by mouth daily at 8 pm.   Yes Historical Provider, MD  Sennosides (SENNA LAX PO) Take 2 tablets by mouth 2 (two) times daily. Take 2 tablets in the morning and take 2 tablets at bedtime.   Yes Historical Provider, MD  travoprost, benzalkonium, (TRAVATAN) 0.004 % ophthalmic solution Place 1 drop into both eyes at bedtime.   Yes Historical Provider, MD  triamcinolone (NASACORT ALLERGY 24HR) 55 MCG/ACT AERO nasal inhaler Place 2 sprays into the  nose daily as needed (allergies).   Yes Historical Provider, MD  triamcinolone cream (KENALOG) 0.1 % Apply 1 application topically 3 (three) times daily as needed (worsening symtoms then resume as needed).   Yes Historical Provider, MD  triazolam (HALCION) 0.25 MG tablet Take 0.25 mg by mouth at bedtime.   Yes Historical Provider, MD  pantoprazole (PROTONIX) 40 MG tablet Take 1 tablet (40 mg total) by mouth daily at 6 (six) AM. Patient not taking: Reported on 01/16/2015 01/14/15   [name], MD   BP 136/79 mmHg  Pulse 89  Temp(Src) 100.9 F (38.3 C) (Rectal)  Resp 16  SpO2 93% Physical Exam  Constitutional: She appears well-developed and well-nourished. No distress.  HENT:  Head: Normocephalic and atraumatic.  Eyes: Conjunctivae are normal. Right eye exhibits no discharge. Left eye exhibits no discharge. No scleral icterus.  Neck: Normal range of motion.  Cardiovascular: Normal rate, regular rhythm, normal heart sounds and intact distal pulses.   Pedal pulse palpable with cap refill < 3  Pulmonary/Chest: Effort normal and breath sounds normal. No respiratory distress. She has no wheezes. She has no rales.  Abdominal: Soft. There is no tenderness. There is no rebound and no guarding.  Musculoskeletal: Normal range of motion.       Left hip: She exhibits tenderness. She exhibits normal range of motion, normal strength, no swelling and no deformity.  Mild TTP over left lateral hip. No obvious edema or deformity. Pt retains full ROM of the left hip.   Neurological: She is alert. Coordination normal.  5/5 strength in all major muscle groups. Sensation to light touch intact throughout.   Skin: Skin is warm and dry.  Psychiatric: She has a normal mood and affect. Her behavior is normal.  Nursing note and vitals reviewed.   ED Course  Procedures (including critical care time) Labs Review Labs Reviewed  COMPREHENSIVE METABOLIC PANEL - Abnormal; Notable for the following:    Potassium  3.0 (*)    Glucose, Bld 134 (*)    Calcium 8.5 (*)    Total Protein 6.4 (*)    Albumin 3.2 (*)    All other components within normal limits  CBC WITH DIFFERENTIAL/PLATELET  LIPASE, BLOOD  MAGNESIUM  PHOSPHORUS  URINALYSIS, ROUTINE W REFLEX MICROSCOPIC (NOT AT [facility])    Imaging Review Dg Chest 2 View  01/16/2015  CLINICAL DATA:  Shortness of breath.  COPD. EXAM: CHEST  2 VIEW COMPARISON:  01/12/2015. FINDINGS: Interval small left pleural effusion and mild increase in patchy density at the left lung base. There is also interval increased density at the posterior right lung base. The lungs remain mildly hyperexpanded with mild diffuse peribronchial thickening and accentuation of the interstitial markings. The heart remains normal in size. Mild scoliosis is unchanged. IMPRESSION: 1. Interval small left pleural effusion. 2. Increased left basilar pneumonia or atelectasis. 3. Interval probable atelectasis at the posterior right lung base. Electronically Signed   By: Claudie Revering M.D.   On: 01/16/2015 20:50   Dg Hip Unilat With Pelvis 2-3 Views Left  01/16/2015  CLINICAL DATA:  [place] tonight.  Left hip pain. EXAM: DG HIP (WITH OR WITHOUT PELVIS) 2-3V LEFT COMPARISON:  CT scan 01/11/2015. FINDINGS: Both hips are normally located. Mild degenerative changes bilaterally. No acute hip fracture is identified. No plain film findings for avascular necrosis. The pubic symphysis and SI joints are intact. No pelvic fractures are identified. IMPRESSION: Mild bilateral hip joint degenerative changes but no acute hip or pelvic fracture. Electronically Signed   By: Marijo Sanes M.D.   On: 01/16/2015 20:47   I have personally reviewed and evaluated these images and lab results as part of my medical decision-making.   EKG Interpretation None      MDM   Final diagnoses:  Nausea and vomiting, vomiting of unspecified type   81 year old female presenting with nausea and vomiting. She was recently hospitalized for  same complaints with a GI work up. She reports no improvement in symptoms since discharge. She also reports a fall yesterday but was assessed by her nursing facility who did not believe she needed transfer to ED. She was noted to be slightly hypoxic to 88% on presentation to ED and has required 2 L oxygen to keep sats above 95%. She did not have a prior oxygen requirement despite having COPD. Abdomen is soft, non-tender. Lungs CTAB. CXR shows increase in a left lung infiltrate that was visible during her last hospitalization but not treated with abx. Potassium 3.0 and repleted in ED. Consulted hospitalist who will admit pt for treatment of aspiration vs. HCAP with new oxygen requirement. Dr. Olevia Bowens is admitting.     Josephina Gip, PA-C 01/17/15 0032  Harvel Quale, MD 01/17/15 [phone]

## 2015-01-16 NOTE — ED Notes (Signed)
Pt from [place], facility reports that they have had a norovirus outbreak. Pt has emesis x2 days. Pt is A&O and in NAD

## 2015-01-17 ENCOUNTER — Encounter (HOSPITAL_COMMUNITY): Payer: Self-pay | Admitting: *Deleted

## 2015-01-17 DIAGNOSIS — R112 Nausea with vomiting, unspecified: Secondary | ICD-10-CM | POA: Diagnosis present

## 2015-01-17 DIAGNOSIS — N390 Urinary tract infection, site not specified: Secondary | ICD-10-CM | POA: Insufficient documentation

## 2015-01-17 DIAGNOSIS — G9341 Metabolic encephalopathy: Secondary | ICD-10-CM | POA: Insufficient documentation

## 2015-01-17 DIAGNOSIS — R509 Fever, unspecified: Secondary | ICD-10-CM | POA: Insufficient documentation

## 2015-01-17 DIAGNOSIS — K224 Dyskinesia of esophagus: Secondary | ICD-10-CM | POA: Insufficient documentation

## 2015-01-17 DIAGNOSIS — Q394 Esophageal web: Secondary | ICD-10-CM

## 2015-01-17 LAB — CBC WITH DIFFERENTIAL/PLATELET
Basophils Absolute: 0 10*3/uL (ref 0.0–0.1)
Basophils Relative: 0 %
EOS ABS: 0.1 10*3/uL (ref 0.0–0.7)
EOS PCT: 2 %
HCT: 36.7 % (ref 36.0–46.0)
Hemoglobin: 11.7 g/dL — ABNORMAL LOW (ref 12.0–15.0)
LYMPHS ABS: 1.4 10*3/uL (ref 0.7–4.0)
LYMPHS PCT: 20 %
MCH: 29.7 pg (ref 26.0–34.0)
MCHC: 31.9 g/dL (ref 30.0–36.0)
MCV: 93.1 fL (ref 78.0–100.0)
MONO ABS: 0.6 10*3/uL (ref 0.1–1.0)
MONOS PCT: 9 %
Neutro Abs: 4.8 10*3/uL (ref 1.7–7.7)
Neutrophils Relative %: 69 %
PLATELETS: 207 10*3/uL (ref 150–400)
RBC: 3.94 MIL/uL (ref 3.87–5.11)
RDW: 14.4 % (ref 11.5–15.5)
WBC: 6.9 10*3/uL (ref 4.0–10.5)

## 2015-01-17 LAB — URINALYSIS, ROUTINE W REFLEX MICROSCOPIC
Glucose, UA: NEGATIVE mg/dL
Hgb urine dipstick: NEGATIVE
KETONES UR: NEGATIVE mg/dL
NITRITE: NEGATIVE
PH: 6 (ref 5.0–8.0)
PROTEIN: NEGATIVE mg/dL
Specific Gravity, Urine: 1.025 (ref 1.005–1.030)

## 2015-01-17 LAB — COMPREHENSIVE METABOLIC PANEL
ALT: 14 U/L (ref 14–54)
ANION GAP: 9 (ref 5–15)
AST: 21 U/L (ref 15–41)
Albumin: 3.2 g/dL — ABNORMAL LOW (ref 3.5–5.0)
Alkaline Phosphatase: 77 U/L (ref 38–126)
BUN: 16 mg/dL (ref 6–20)
CALCIUM: 8.2 mg/dL — AB (ref 8.9–10.3)
CHLORIDE: 109 mmol/L (ref 101–111)
CO2: 27 mmol/L (ref 22–32)
CREATININE: 0.63 mg/dL (ref 0.44–1.00)
Glucose, Bld: 104 mg/dL — ABNORMAL HIGH (ref 65–99)
Potassium: 3 mmol/L — ABNORMAL LOW (ref 3.5–5.1)
SODIUM: 145 mmol/L (ref 135–145)
Total Bilirubin: 0.6 mg/dL (ref 0.3–1.2)
Total Protein: 6.2 g/dL — ABNORMAL LOW (ref 6.5–8.1)

## 2015-01-17 LAB — URINE MICROSCOPIC-ADD ON

## 2015-01-17 LAB — STREP PNEUMONIAE URINARY ANTIGEN: Strep Pneumo Urinary Antigen: NEGATIVE

## 2015-01-17 LAB — MAGNESIUM: Magnesium: 1.8 mg/dL (ref 1.7–2.4)

## 2015-01-17 LAB — PHOSPHORUS: Phosphorus: 3.3 mg/dL (ref 2.5–4.6)

## 2015-01-17 LAB — MRSA PCR SCREENING: MRSA BY PCR: NEGATIVE

## 2015-01-17 MED ORDER — IPRATROPIUM-ALBUTEROL 0.5-2.5 (3) MG/3ML IN SOLN
3.0000 mL | Freq: Three times a day (TID) | RESPIRATORY_TRACT | Status: DC
  Administered 2015-01-17 – 2015-01-19 (×6): 3 mL via RESPIRATORY_TRACT
  Filled 2015-01-17 (×8): qty 3

## 2015-01-17 MED ORDER — LORAZEPAM 2 MG/ML IJ SOLN
0.5000 mg | Freq: Once | INTRAMUSCULAR | Status: AC
  Administered 2015-01-17: 0.5 mg via INTRAVENOUS
  Filled 2015-01-17: qty 1

## 2015-01-17 MED ORDER — POTASSIUM CHLORIDE IN NACL 40-0.9 MEQ/L-% IV SOLN
INTRAVENOUS | Status: AC
  Administered 2015-01-17 (×2): 75 mL/h via INTRAVENOUS
  Filled 2015-01-17 (×3): qty 1000

## 2015-01-17 MED ORDER — HYDROCORTISONE 2.5 % RE CREA: 1.0000 "application " | TOPICAL_CREAM | Freq: Two times a day (BID) | RECTAL | Status: DC | PRN

## 2015-01-17 MED ORDER — SODIUM CHLORIDE 0.9 % IV SOLN
1.5000 g | Freq: Four times a day (QID) | INTRAVENOUS | Status: DC
  Administered 2015-01-17: 1.5 g via INTRAVENOUS
  Filled 2015-01-17 (×2): qty 1.5

## 2015-01-17 MED ORDER — IPRATROPIUM-ALBUTEROL 0.5-2.5 (3) MG/3ML IN SOLN: 3.0000 mL | Freq: Three times a day (TID) | RESPIRATORY_TRACT | Status: DC

## 2015-01-17 MED ORDER — MIRTAZAPINE 15 MG PO TABS
30.0000 mg | ORAL_TABLET | Freq: Every day | ORAL | Status: DC
  Administered 2015-01-17 – 2015-01-18 (×2): 30 mg via ORAL
  Filled 2015-01-17 (×2): qty 2

## 2015-01-17 MED ORDER — SODIUM CHLORIDE 0.9 % IV SOLN: 250.0000 mL | INTRAVENOUS | Status: DC | PRN

## 2015-01-17 MED ORDER — LORAZEPAM 0.5 MG PO TABS
0.5000 mg | ORAL_TABLET | Freq: Every day | ORAL | Status: DC
  Administered 2015-01-17 – 2015-01-19 (×3): 0.5 mg via ORAL
  Filled 2015-01-17 (×3): qty 1

## 2015-01-17 MED ORDER — LATANOPROST 0.005 % OP SOLN
1.0000 [drp] | Freq: Every day | OPHTHALMIC | Status: DC
  Administered 2015-01-17: 1 [drp] via OPHTHALMIC
  Filled 2015-01-17: qty 2.5

## 2015-01-17 MED ORDER — FLUDROCORTISONE ACETATE 0.1 MG PO TABS
0.1000 mg | ORAL_TABLET | Freq: Every morning | ORAL | Status: DC
  Administered 2015-01-17 – 2015-01-18 (×2): 0.1 mg via ORAL
  Filled 2015-01-17 (×2): qty 1

## 2015-01-17 MED ORDER — ENOXAPARIN SODIUM 40 MG/0.4ML ~~LOC~~ SOLN
40.0000 mg | SUBCUTANEOUS | Status: DC
  Administered 2015-01-17 – 2015-01-19 (×3): 40 mg via SUBCUTANEOUS
  Filled 2015-01-17 (×3): qty 0.4

## 2015-01-17 MED ORDER — SODIUM CHLORIDE 0.9 % IJ SOLN
3.0000 mL | Freq: Two times a day (BID) | INTRAMUSCULAR | Status: DC
  Administered 2015-01-17: 3 mL via INTRAVENOUS

## 2015-01-17 MED ORDER — QUETIAPINE FUMARATE ER 50 MG PO TB24
150.0000 mg | ORAL_TABLET | Freq: Every day | ORAL | Status: DC
  Administered 2015-01-17 – 2015-01-18 (×2): 150 mg via ORAL
  Filled 2015-01-17 (×3): qty 3

## 2015-01-17 MED ORDER — SODIUM CHLORIDE 0.9 % IJ SOLN: 3.0000 mL | INTRAMUSCULAR | Status: DC | PRN

## 2015-01-17 MED ORDER — HYDROCODONE-ACETAMINOPHEN 10-325 MG PO TABS
1.0000 | ORAL_TABLET | Freq: Three times a day (TID) | ORAL | Status: DC
  Administered 2015-01-17 – 2015-01-19 (×6): 1 via ORAL
  Filled 2015-01-17 (×7): qty 1

## 2015-01-17 MED ORDER — ONDANSETRON HCL 4 MG/2ML IJ SOLN: 4.0000 mg | Freq: Four times a day (QID) | INTRAMUSCULAR | Status: DC | PRN

## 2015-01-17 MED ORDER — DORZOLAMIDE HCL-TIMOLOL MAL 2-0.5 % OP SOLN
1.0000 [drp] | Freq: Two times a day (BID) | OPHTHALMIC | Status: DC
  Administered 2015-01-17 – 2015-01-18 (×3): 1 [drp] via OPHTHALMIC
  Filled 2015-01-17: qty 10

## 2015-01-17 MED ORDER — SODIUM CHLORIDE 0.9 % IV SOLN
3.0000 g | Freq: Four times a day (QID) | INTRAVENOUS | Status: DC
  Administered 2015-01-17 – 2015-01-19 (×10): 3 g via INTRAVENOUS
  Filled 2015-01-17 (×11): qty 3

## 2015-01-17 MED ORDER — BUDESONIDE 0.25 MG/2ML IN SUSP
0.2500 mg | Freq: Two times a day (BID) | RESPIRATORY_TRACT | Status: DC | PRN
  Administered 2015-01-17: 0.25 mg via RESPIRATORY_TRACT
  Filled 2015-01-17: qty 2

## 2015-01-17 MED ORDER — NITROGLYCERIN 0.4 MG SL SUBL: 0.4000 mg | SUBLINGUAL_TABLET | SUBLINGUAL | Status: DC | PRN

## 2015-01-17 MED ORDER — FLUTICASONE PROPIONATE HFA 110 MCG/ACT IN AERO: 1.0000 | INHALATION_SPRAY | Freq: Two times a day (BID) | RESPIRATORY_TRACT | Status: DC | PRN

## 2015-01-17 MED ORDER — FLUTICASONE PROPIONATE 50 MCG/ACT NA SUSP: 2.0000 | Freq: Every day | NASAL | Status: DC | PRN

## 2015-01-17 MED ORDER — POTASSIUM CHLORIDE 10 MEQ/100ML IV SOLN
10.0000 meq | INTRAVENOUS | Status: AC
  Administered 2015-01-17 (×4): 10 meq via INTRAVENOUS
  Filled 2015-01-17 (×4): qty 100

## 2015-01-17 MED ORDER — METOPROLOL TARTRATE 1 MG/ML IV SOLN
2.5000 mg | INTRAVENOUS | Status: DC | PRN
  Administered 2015-01-18 – 2015-01-19 (×2): 2.5 mg via INTRAVENOUS
  Filled 2015-01-17 (×4): qty 5

## 2015-01-17 MED ORDER — CLORAZEPATE DIPOTASSIUM 3.75 MG PO TABS
7.5000 mg | ORAL_TABLET | Freq: Two times a day (BID) | ORAL | Status: DC
  Administered 2015-01-17 – 2015-01-19 (×6): 7.5 mg via ORAL
  Filled 2015-01-17 (×6): qty 2

## 2015-01-17 MED ORDER — FLUCONAZOLE 100 MG PO TABS
100.0000 mg | ORAL_TABLET | Freq: Every day | ORAL | Status: DC
  Administered 2015-01-17 – 2015-01-19 (×3): 100 mg via ORAL
  Filled 2015-01-17 (×3): qty 1

## 2015-01-17 MED ORDER — NYSTATIN 100000 UNIT/ML MT SUSP: 5.0000 mL | Freq: Three times a day (TID) | OROMUCOSAL | Status: DC | PRN

## 2015-01-17 MED ORDER — ONDANSETRON HCL 4 MG PO TABS: 4.0000 mg | ORAL_TABLET | Freq: Four times a day (QID) | ORAL | Status: DC | PRN

## 2015-01-17 MED ORDER — TRIAMCINOLONE ACETONIDE 55 MCG/ACT NA AERO: 2.0000 | INHALATION_SPRAY | Freq: Every day | NASAL | Status: DC | PRN

## 2015-01-17 MED ORDER — DULOXETINE HCL 30 MG PO CPEP
30.0000 mg | ORAL_CAPSULE | Freq: Every day | ORAL | Status: DC
  Administered 2015-01-17 – 2015-01-19 (×3): 30 mg via ORAL
  Filled 2015-01-17 (×3): qty 1

## 2015-01-17 MED ORDER — SODIUM CHLORIDE 0.9 % IJ SOLN
3.0000 mL | Freq: Two times a day (BID) | INTRAMUSCULAR | Status: DC
Start: 2015-01-17 — End: 2015-01-19
  Administered 2015-01-17: 3 mL via INTRAVENOUS

## 2015-01-17 MED ORDER — ACETAMINOPHEN 325 MG PO TABS
650.0000 mg | ORAL_TABLET | Freq: Four times a day (QID) | ORAL | Status: DC | PRN
  Administered 2015-01-17 (×2): 650 mg via ORAL
  Filled 2015-01-17 (×2): qty 2

## 2015-01-17 MED ORDER — MINERAL OIL RE ENEM
1.0000 | ENEMA | Freq: Once | RECTAL | Status: DC
  Filled 2015-01-17: qty 1

## 2015-01-17 MED ORDER — DIVALPROEX SODIUM 250 MG PO DR TAB
250.0000 mg | DELAYED_RELEASE_TABLET | Freq: Every day | ORAL | Status: DC
  Administered 2015-01-17 – 2015-01-19 (×3): 250 mg via ORAL
  Filled 2015-01-17 (×3): qty 1

## 2015-01-17 MED ORDER — TRAVOPROST 0.004 % OP SOLN: 1.0000 [drp] | Freq: Every day | OPHTHALMIC | Status: DC

## 2015-01-17 NOTE — Progress Notes (Signed)
ANTIBIOTIC CONSULT NOTE - INITIAL  Pharmacy Consult for Unasyn Indication: Aspiration PNA  Allergies  Allergen Reactions  . Macrobid [Nitrofurantoin Macrocrystal] Rash  . Azithromycin Other (See Comments)    unknown  . Doxycycline Other (See Comments)    unknown  . Escitalopram Oxalate Other (See Comments)    unknown  . Fioricet [Butalbital-Apap-Caffeine] Other (See Comments)    Drunk.  . Latex Other (See Comments)    unknown  . Levofloxacin Other (See Comments)    unknown  . Nsaids Other (See Comments)    Allergic reaction not listed on MAR.  Marland Kitchen Oxycodone Other (See Comments)    reaction to synthetic codeine  . Restasis [Cyclosporine] Other (See Comments)    unknown  . Sulfa Antibiotics Other (See Comments)    Reaction unknown  . Tylenol [Acetaminophen] Other (See Comments)    jaundice  . Biaxin [Clarithromycin] Rash    With burning sensation    Patient Measurements: Height: 5' (152.4 cm) Weight: 118 lb 8 oz (53.751 kg) IBW/kg (Calculated) : 45.5   Vital Signs: Temp: 98.4 F (36.9 C) (12/05 0135) Temp Source: Rectal (12/04 2012) BP: 172/75 mmHg (12/05 0135) Pulse Rate: 93 (12/05 0135) Intake/Output from previous day:   Intake/Output from this shift:    Labs:  Recent Labs  01/14/15 0530 01/16/15 2059 01/17/15 0223  WBC 6.3 7.9 6.9  HGB 11.4* 12.2 11.7*  PLT 180 206 207  CREATININE 0.75 0.66  --    Estimated Creatinine Clearance: 37.6 mL/min (by C-G formula based on Cr of 0.66). No results for input(s): VANCOTROUGH, VANCOPEAK, VANCORANDOM, GENTTROUGH, GENTPEAK, GENTRANDOM, TOBRATROUGH, TOBRAPEAK, TOBRARND, AMIKACINPEAK, AMIKACINTROU, AMIKACIN in the last 72 hours.   Microbiology: Recent Results (from the past 720 hour(s))  Urine culture     Status: None   Collection Time: 12/25/14  4:20 PM  Result Value Ref Range Status   Specimen Description URINE, RANDOM  Final   Special Requests Normal  Final   Culture   Final    MULTIPLE SPECIES PRESENT,  SUGGEST RECOLLECTION Performed at [facility]    Report Status 12/27/2014 FINAL  Final    Medical History: Past Medical History  Diagnosis Date  . Osteoporosis   . Depression   . Anxiety   . COPD (chronic obstructive pulmonary disease) ([place])   . GERD (gastroesophageal reflux disease)   . Abuse     benzos/narcotics  . Migraines   . Scoliosis   . Insomnia, unspecified   . Vitamin B deficiency   . Weight loss   . Chronic mental illness   . Arterial tortuosity (aorta) 12/11/2012  . Chronic kidney disease, stage IV (severe) ([place]) 12/11/2012  . Substance abuse     Benzos and Narcotics, she does not get any of those medicines from [facility], we have empahtically told her that.   . Vertigo   . Colitis   . Schatzki's ring 01/14/2015  . Dysphagia 01/11/2015  . Nasal bone fracture 03/30/2014    Medications:  Prescriptions prior to admission  Medication Sig Dispense Refill Last Dose  . acetaminophen (TYLENOL) 325 MG tablet Take 650 mg by mouth every 6 (six) hours as needed for headache.   01/13/2015 at 1410   . Acetic Acid (DOUCHE VINEGAR/WATER VA) Place 1 each around the anus once a week. To prevent UTI   01/15/2015  . aspirin 81 MG chewable tablet Chew 1 tablet (81 mg total) by mouth daily.   01/16/2015 at 0800  . Cholecalciferol (VITAMIN D) 1000 UNITS  capsule Take 1,000 Units by mouth daily.   01/16/2015 at 0800  . clorazepate (TRANXENE) 7.5 MG tablet Take 7.5 mg by mouth 2 (two) times daily.    01/16/2015 at 1900  . Cranberry 425 MG CAPS Take 425 mg by mouth 2 (two) times daily.    01/16/2015 at 1900  . diclofenac (FLECTOR) 1.3 % PTCH Place 1 patch onto the skin every 12 (twelve) hours as needed (For localized pain.).    01/14/2015 at 1410  . divalproex (DEPAKOTE) 250 MG DR tablet Take 250 mg by mouth daily.   01/16/2015 at 0800  . docusate sodium (COLACE) 100 MG capsule Take 100 mg by mouth 2 (two) times daily.   01/16/2015 at 1900  . dorzolamide-timolol (COSOPT) 22.3-6.8 MG/ML  ophthalmic solution Place 1 drop into both eyes 2 (two) times daily.   01/16/2015 at 1900  . DULoxetine (CYMBALTA) 30 MG capsule Take 30 mg by mouth daily.   01/16/2015 at 0800  . fluconazole (DIFLUCAN) 100 MG tablet Take 100 mg by mouth daily.   01/16/2015 at 2000  . fludrocortisone (FLORINEF) 0.1 MG tablet Take 0.1 mg by mouth every morning.   01/16/2015 at 0800  . fluticasone (FLOVENT HFA) 110 MCG/ACT inhaler Inhale 1 puff into the lungs 2 (two) times daily as needed (shortness of breath.).   unknown  . Ginkgo Biloba 120 MG CAPS Take 120 mg by mouth 2 (two) times daily.    01/16/2015 at 1900  . HYDROcodone-acetaminophen (NORCO) 10-325 MG per tablet Take 1 tablet by mouth every 6 (six) hours as needed (pain). (Patient taking differently: Take 1 tablet by mouth every 8 (eight) hours. Scheduled at 6am, 12 noon, and 6pm. And may take 1 tablet at 12 midnight as needed for pain) 30 tablet 0 01/16/2015 at 1200  . hydrocortisone (ANUSOL-HC) 2.5 % rectal cream Place 1 application rectally 2 (two) times daily as needed for hemorrhoids or itching.   unknown  . LORazepam (ATIVAN) 0.5 MG tablet Take 0.5 mg by mouth daily. May also take a full tablet twice daily as needed for anxiety   01/16/2015 at 1200  . meclizine (ANTIVERT) 25 MG tablet Take 25 mg by mouth 3 (three) times daily as needed for dizziness. Do not combine with Hydroxyzine or any other antihistamine.   01/13/2015 at 1100  . Melatonin 1 MG TABS Take 1 mg by mouth at bedtime.    01/16/2015 at 1900  . mineral oil-hydrophilic petrolatum (AQUAPHOR) ointment Apply 1 application topically daily.   01/16/2015 at Unknown time  . mirtazapine (REMERON) 45 MG tablet Take 45 mg by mouth at bedtime.   01/16/2015 at 1900  . nitroGLYCERIN (NITROSTAT) 0.4 MG SL tablet Place 0.4 mg under the tongue every 5 (five) minutes as needed for chest pain. May use 3 times   unknown  . nystatin (MYCOSTATIN) 100000 UNIT/ML suspension Take 5 mLs by mouth 3 (three) times daily as needed  (mouth pain).    unknown  . omeprazole (PRILOSEC) 20 MG capsule Take 20 mg by mouth daily.   01/16/2015 at 0600  . ondansetron (ZOFRAN) 4 MG tablet Take 1 tablet (4 mg total) by mouth every 6 (six) hours. (Patient taking differently: Take 4 mg by mouth every 4 (four) hours as needed for nausea or vomiting. ) 12 tablet 0 unknown  . polyethylene glycol (MIRALAX / GLYCOLAX) packet Take 17 g by mouth daily as needed for mild constipation or moderate constipation.    unknown  . PRESCRIPTION MEDICATION Take 1  Container by mouth 2 (two) times daily between meals. Med Pass   01/16/2015 at 1800  . QUEtiapine Fumarate (SEROQUEL XR) 150 MG 24 hr tablet Take 150 mg by mouth daily at 8 pm.   01/16/2015 at 2000  . Sennosides (SENNA LAX PO) Take 2 tablets by mouth 2 (two) times daily. Take 2 tablets in the morning and take 2 tablets at bedtime.   01/16/2015 at 1900  . travoprost, benzalkonium, (TRAVATAN) 0.004 % ophthalmic solution Place 1 drop into both eyes at bedtime.   01/15/2015 at 1900  . triamcinolone (NASACORT ALLERGY 24HR) 55 MCG/ACT AERO nasal inhaler Place 2 sprays into the nose daily as needed (allergies).   unknown  . triamcinolone cream (KENALOG) 0.1 % Apply 1 application topically 3 (three) times daily as needed (worsening symtoms then resume as needed).   unknown  . triazolam (HALCION) 0.25 MG tablet Take 0.25 mg by mouth at bedtime.   01/16/2015 at 1900  . pantoprazole (PROTONIX) 40 MG tablet Take 1 tablet (40 mg total) by mouth daily at 6 (six) AM. (Patient not taking: Reported on 01/16/2015) 30 tablet 2 Not Taking at Unknown time   Scheduled:  . ampicillin-sulbactam (UNASYN) IV  3 g Intravenous Q6H  . clorazepate  7.5 mg Oral BID  . divalproex  250 mg Oral Daily  . dorzolamide-timolol  1 drop Both Eyes BID  . DULoxetine  30 mg Oral Daily  . fluconazole  100 mg Oral Daily  . fludrocortisone  0.1 mg Oral q morning - 10a  . ipratropium-albuterol  3 mL Nebulization TID  . latanoprost  1 drop Both Eyes  QHS  . mirtazapine  30 mg Oral QHS  . pantoprazole (PROTONIX) IV  40 mg Intravenous Q24H  . QUEtiapine Fumarate  150 mg Oral Q2000  . sodium chloride  3 mL Intravenous Q12H  . sodium chloride  3 mL Intravenous Q12H   Infusions:  . 0.9 % NaCl with KCl 40 mEq / L 75 mL/hr (01/17/15 0155)   Assessment: 72 yoF c/o n/v. Rx can adjust abx for renal function:  Unasyn for aspiration PNA.  Goal of Therapy:  Treat PNA  Plan:   Unasyn 3Gm IV q6h  F/u cultures/SCr  Dorrene German 01/17/2015,2:47 AM

## 2015-01-17 NOTE — H&P (Signed)
[name] History and Physical  Taylor Roth [id number] DOB: 02/12/1929 DOA: 01/16/2015  Referring physician: Josephina Gip, PA-C PCP: Pcp Not In System   Chief Complaint: Nausea and vomiting.  HPI: Taylor Roth is a 81 y.o. female with below past medical history, well known to me from an earlier admission this week who returns to the hospital from [facility] facility due to nausea and emesis yesterday and today 2 days after being discharged for dysphagia secondary to a nonobstructive Schatzki's ring. [facility] reports that they have and norovirus outbreak. However, the patient reports that she has not have a significant change in her symptoms since she was discharged on Friday. She denies fever, chills, dyspnea, sore throat, productive cough, chest pain, palpitations, diaphoresis, pitting edema of the lower extremities, abdominal pain, diarrhea or urinary symptoms.   She also reports a fall yesterday at the facility. She just states that she got up without assistance and has been able to ambulate since then, but complains of left hip pain. Hip x-rays in the emergency department were negative for fracture and positive for DJD changes.  Patient was also noticed to be hypoxic at 88% in the emergency department, which improved with nasal cannula oxygen. Chest radiograph shows left lung infiltrate.   Review of Systems:  As above mentioned The patient was tired and handed me the phone to speak with her daughter.  Past Medical History  Diagnosis Date  . Osteoporosis   . Depression   . Anxiety   . COPD (chronic obstructive pulmonary disease) ([place])   . GERD (gastroesophageal reflux disease)   . Abuse     benzos/narcotics  . Migraines   . Scoliosis   . Insomnia, unspecified   . Vitamin B deficiency   . Weight loss   . Chronic mental illness   . Arterial tortuosity (aorta) 12/11/2012  . Chronic kidney disease, stage IV (severe) ([place]) 12/11/2012  . Substance  abuse     Benzos and Narcotics, she does not get any of those medicines from [facility], we have empahtically told her that.   . Vertigo   . Colitis   . Schatzki's ring 01/14/2015  . Dysphagia 01/11/2015  . Nasal bone fracture 03/30/2014   Past Surgical History  Procedure Laterality Date  . Cholecystectomy    . Partial hysterectomy    . Abdominal hysterectomy    . Cataract extraction    . Esophagogastroduodenoscopy (egd) with propofol N/A 01/13/2015    Procedure: ESOPHAGOGASTRODUODENOSCOPY (EGD) WITH PROPOFOL;  Surgeon: [name], MD;  Location: [facility] ENDOSCOPY;  Service: Endoscopy;  Laterality: N/A;   Social History:  reports that she has quit smoking. Her smoking use included Cigarettes. She smoked 0.50 packs per day. She has never used smokeless tobacco. She reports that she does not drink alcohol or use illicit drugs.  Allergies  Allergen Reactions  . Macrobid [Nitrofurantoin Macrocrystal] Rash  . Azithromycin Other (See Comments)    unknown  . Doxycycline Other (See Comments)    unknown  . Escitalopram Oxalate Other (See Comments)    unknown  . Fioricet [Butalbital-Apap-Caffeine] Other (See Comments)    Drunk.  . Latex Other (See Comments)    unknown  . Levofloxacin Other (See Comments)    unknown  . Nsaids Other (See Comments)    Allergic reaction not listed on MAR.  Marland Kitchen Oxycodone Other (See Comments)    reaction to synthetic codeine  . Restasis [Cyclosporine] Other (See Comments)    unknown  .  Sulfa Antibiotics Other (See Comments)    Reaction unknown  . Tylenol [Acetaminophen] Other (See Comments)    jaundice  . Biaxin [Clarithromycin] Rash    With burning sensation    Family History  Problem Relation Age of Onset  . Stroke Mother   . Diabetes Mother   . Cancer Father 72    esophageal  . Heart disease Mother   . Hearing loss Mother     Prior to Admission medications   Medication Sig Start Date End Date Taking? Authorizing Provider  acetaminophen (TYLENOL) 325  MG tablet Take 650 mg by mouth every 6 (six) hours as needed for headache.   Yes Historical Provider, MD  Acetic Acid (DOUCHE VINEGAR/WATER VA) Place 1 each around the anus once a week. To prevent UTI   Yes Historical Provider, MD  aspirin 81 MG chewable tablet Chew 1 tablet (81 mg total) by mouth daily. 12/12/12  Yes [name], MD  Cholecalciferol (VITAMIN D) 1000 UNITS capsule Take 1,000 Units by mouth daily.   Yes Historical Provider, MD  clorazepate (TRANXENE) 7.5 MG tablet Take 7.5 mg by mouth 2 (two) times daily.  12/23/12  Yes Estill Dooms, MD  Cranberry 425 MG CAPS Take 425 mg by mouth 2 (two) times daily.    Yes Historical Provider, MD  diclofenac (FLECTOR) 1.3 % PTCH Place 1 patch onto the skin every 12 (twelve) hours as needed (For localized pain.).    Yes Historical Provider, MD  divalproex (DEPAKOTE) 250 MG DR tablet Take 250 mg by mouth daily.   Yes Historical Provider, MD  docusate sodium (COLACE) 100 MG capsule Take 100 mg by mouth 2 (two) times daily.   Yes Historical Provider, MD  dorzolamide-timolol (COSOPT) 22.3-6.8 MG/ML ophthalmic solution Place 1 drop into both eyes 2 (two) times daily.   Yes Historical Provider, MD  DULoxetine (CYMBALTA) 30 MG capsule Take 30 mg by mouth daily.   Yes Historical Provider, MD  fluconazole (DIFLUCAN) 100 MG tablet Take 100 mg by mouth daily.   Yes Historical Provider, MD  fludrocortisone (FLORINEF) 0.1 MG tablet Take 0.1 mg by mouth every morning.   Yes Historical Provider, MD  fluticasone (FLOVENT HFA) 110 MCG/ACT inhaler Inhale 1 puff into the lungs 2 (two) times daily as needed (shortness of breath.).   Yes Historical Provider, MD  Ginkgo Biloba 120 MG CAPS Take 120 mg by mouth 2 (two) times daily.    Yes Historical Provider, MD  HYDROcodone-acetaminophen (NORCO) 10-325 MG per tablet Take 1 tablet by mouth every 6 (six) hours as needed (pain). Patient taking differently: Take 1 tablet by mouth every 8 (eight) hours. Scheduled at 6am,  12 noon, and 6pm. And may take 1 tablet at 12 midnight as needed for pain 04/02/14  Yes Theodis Blaze, MD  hydrocortisone (ANUSOL-HC) 2.5 % rectal cream Place 1 application rectally 2 (two) times daily as needed for hemorrhoids or itching.   Yes Historical Provider, MD  LORazepam (ATIVAN) 0.5 MG tablet Take 0.5 mg by mouth daily. May also take a full tablet twice daily as needed for anxiety   Yes Historical Provider, MD  meclizine (ANTIVERT) 25 MG tablet Take 25 mg by mouth 3 (three) times daily as needed for dizziness. Do not combine with Hydroxyzine or any other antihistamine. 11/12/13  Yes Thao P Le, DO  Melatonin 1 MG TABS Take 1 mg by mouth at bedtime.    Yes Historical Provider, MD  mineral oil-hydrophilic petrolatum (AQUAPHOR) ointment Apply  1 application topically daily.   Yes Historical Provider, MD  mirtazapine (REMERON) 45 MG tablet Take 45 mg by mouth at bedtime.   Yes Historical Provider, MD  nitroGLYCERIN (NITROSTAT) 0.4 MG SL tablet Place 0.4 mg under the tongue every 5 (five) minutes as needed for chest pain. May use 3 times   Yes Historical Provider, MD  nystatin (MYCOSTATIN) 100000 UNIT/ML suspension Take 5 mLs by mouth 3 (three) times daily as needed (mouth pain).    Yes Historical Provider, MD  omeprazole (PRILOSEC) 20 MG capsule Take 20 mg by mouth daily.   Yes Historical Provider, MD  ondansetron (ZOFRAN) 4 MG tablet Take 1 tablet (4 mg total) by mouth every 6 (six) hours. Patient taking differently: Take 4 mg by mouth every 4 (four) hours as needed for nausea or vomiting.  12/25/14  Yes Hanna Patel-Mills, PA-C  polyethylene glycol (MIRALAX / GLYCOLAX) packet Take 17 g by mouth daily as needed for mild constipation or moderate constipation.    Yes Historical Provider, MD  PRESCRIPTION MEDICATION Take 1 Container by mouth 2 (two) times daily between meals. Med Pass   Yes Historical Provider, MD  QUEtiapine Fumarate (SEROQUEL XR) 150 MG 24 hr tablet Take 150 mg by mouth daily at 8 pm.    Yes Historical Provider, MD  Sennosides (SENNA LAX PO) Take 2 tablets by mouth 2 (two) times daily. Take 2 tablets in the morning and take 2 tablets at bedtime.   Yes Historical Provider, MD  travoprost, benzalkonium, (TRAVATAN) 0.004 % ophthalmic solution Place 1 drop into both eyes at bedtime.   Yes Historical Provider, MD  triamcinolone (NASACORT ALLERGY 24HR) 55 MCG/ACT AERO nasal inhaler Place 2 sprays into the nose daily as needed (allergies).   Yes Historical Provider, MD  triamcinolone cream (KENALOG) 0.1 % Apply 1 application topically 3 (three) times daily as needed (worsening symtoms then resume as needed).   Yes Historical Provider, MD  triazolam (HALCION) 0.25 MG tablet Take 0.25 mg by mouth at bedtime.   Yes Historical Provider, MD  pantoprazole (PROTONIX) 40 MG tablet Take 1 tablet (40 mg total) by mouth daily at 6 (six) AM. Patient not taking: Reported on 01/16/2015 01/14/15   [name], MD   Physical Exam: Filed Vitals:   01/16/15 1914 01/16/15 2012 01/16/15 2143 01/16/15 2348  BP:   154/78 136/79  Pulse:   92 89  Temp:  100.9 F (38.3 C)    TempSrc:  Rectal    Resp:   16 16  SpO2: 98%  93% 93%    Wt Readings from Last 3 Encounters:  01/11/15 51.9 kg (114 lb 6.7 oz)  03/30/14 48 kg (105 lb 13.1 oz)  03/06/14 50.349 kg (111 lb)    General:  Appears calm and comfortable Eyes: PERRL, normal lids, irises & conjunctiva ENT: grossly normal hearing, lips, tongue and oral mucosa are dry. Neck: no LAD, masses or thyromegaly Cardiovascular: RRR, no m/r/g. No LE edema. Telemetry: SR, no arrhythmias  Respiratory: Decreased breath sounds bilaterally, left more than right, left lower lobe rales, no wheezing, no rhonchi. No accessory muscle use. Normal respiratory effort. Abdomen: Bowel sounds positive, soft, mild epigastric tenderness, no guarding, no rebound tenderness, no organomegaly was palpated. Skin: no rash or induration seen on limited exam Musculoskeletal:  grossly normal tone BUE/BLE Psychiatric: grossly normal mood and affect, speech fluent and appropriate Neurologic: grossly non-focal.          Labs on Admission:  Basic Metabolic Panel:  Recent Labs Lab 01/11/15 1430 01/14/15 0530 01/16/15 2059  NA 142 140 143  K 3.7 3.2* 3.0*  CL 108 108 108  CO2 28 26 27   GLUCOSE 105* 106* 134*  BUN 19 13 17   CREATININE 0.89 0.75 0.66  CALCIUM 8.7* 8.5* 8.5*  MG  --   --  1.8  PHOS  --   --  3.3   Liver Function Tests:  Recent Labs Lab 01/11/15 1430 01/16/15 2059  AST 27 23  ALT 16 16  ALKPHOS 118 84  BILITOT 0.1* 0.5  PROT 6.6 6.4*  ALBUMIN 3.9 3.2*    Recent Labs Lab 01/11/15 1430 01/16/15 2059  LIPASE 23 23   CBC:  Recent Labs Lab 01/11/15 1430 01/14/15 0530 01/16/15 2059  WBC 4.9 6.3 7.9  NEUTROABS 2.5  --  6.0  HGB 12.1 11.4* 12.2  HCT 37.7 34.8* 38.6  MCV 93.1 92.3 93.0  PLT 221 180 206    Radiological Exams on Admission: Dg Chest 2 View  01/16/2015  CLINICAL DATA:  Shortness of breath.  COPD. EXAM: CHEST  2 VIEW COMPARISON:  01/12/2015. FINDINGS: Interval small left pleural effusion and mild increase in patchy density at the left lung base. There is also interval increased density at the posterior right lung base. The lungs remain mildly hyperexpanded with mild diffuse peribronchial thickening and accentuation of the interstitial markings. The heart remains normal in size. Mild scoliosis is unchanged. IMPRESSION: 1. Interval small left pleural effusion. 2. Increased left basilar pneumonia or atelectasis. 3. Interval probable atelectasis at the posterior right lung base. Electronically Signed   By: Claudie Revering M.D.   On: 01/16/2015 20:50   Dg Hip Unilat With Pelvis 2-3 Views Left  01/16/2015  CLINICAL DATA:  [place] tonight.  Left hip pain. EXAM: DG HIP (WITH OR WITHOUT PELVIS) 2-3V LEFT COMPARISON:  CT scan 01/11/2015. FINDINGS: Both hips are normally located. Mild degenerative changes bilaterally. No acute hip  fracture is identified. No plain film findings for avascular necrosis. The pubic symphysis and SI joints are intact. No pelvic fractures are identified. IMPRESSION: Mild bilateral hip joint degenerative changes but no acute hip or pelvic fracture. Electronically Signed   By: Marijo Sanes M.D.   On: 01/16/2015 20:47      Assessment/Plan Principal Problem:   Aspiration pneumonia ([place]) Admit to telemetry. Continue supplemental oxygen. Continue bronchodilators. Start Unasyn for suspected aspiration pneumonia. If possible, check a sputum Gram stain, culture and sensitivity. Check Legionella and strep pneumoniae urine antigens.   Active Problems:    Schatzki's ring   Nausea and vomiting Continue proton pump inhibitor and antiemetics. Patient to follow-up with GI as an outpatient for gastric biopsies and possible H pylori treatment depending on results.    COPD (chronic obstructive pulmonary disease) ([facility]) Continue supplemental oxygen and bronchodilators.    Glaucoma Continue current topical drops.    Hypokalemia Replaced potassium. Monitor level.    CKD (chronic kidney disease), stage III Monitor electrolytes, BUN and creatinine.     History of depression, anxiety Continue duloxetine, mirtazapine, Seroquel and anxiolytics as needed.   Code Status: Full code. DVT Prophylaxis: Mechanical with SCDs. Family Communication:  [name] Daughter [phone]    Maricela Curet Daughter [phone]  [phone]      Disposition Plan: Admit for IV hydration and symptoms treatment.  Time spent: Over 90 minutes were spent during the process of this admission.  Reubin Milan [name] Pager [phone].

## 2015-01-17 NOTE — Plan of Care (Signed)
Problem: Phase I Progression Outcomes Goal: Flu/PneumoVaccines if indicated Outcome: Not Applicable Date Met:  15/17/61 Vaccinations up to date

## 2015-01-17 NOTE — Consult Note (Signed)
[facility] CONSULT Reason for consult: nausea vomiting and emesis Referring Physician: [facility] hospitalist  Taylor Roth is an 81 y.o. female.  HPI: she was seen by our service just last week. At that time she presented with the same symptoms and a question of an obstructed esophagus. A water-soluble contrast study was performed in the emergency room. The patient was only able to swallow a small amount of the contrast but this did show food material in the esophagus as well as a lot of liquid and a dilated esophagus. Dr. Penelope Coop performed EGD just 3 days ago and this showed up significantly tortuous esophagus with dilation but no clear stricture and appeared from the pictures to have a fairly wide open GE junction but marked angulation of the esophagus at that point. Biopsies were obtained and were negative for H. pylori. According to the discharge summary, the patient was tolerating soft moist foods upon discharge. She was readmitted with probable aspiration pneumonia based on chest x-ray and was started on antibiotics. The history at this point is somewhat suspect. The patient states that since her discharge she was not given any food or liquid at all and therefore had no trouble swallowing anything after discharge back to the home. The nurse stated that she swallowed liquids and pills yesterday without any problem and without any emesis or regurgitation.  Past Medical History  Diagnosis Date  . Osteoporosis   . Depression   . Anxiety   . COPD (chronic obstructive pulmonary disease) ([place])   . GERD (gastroesophageal reflux disease)   . Abuse     benzos/narcotics  . Migraines   . Scoliosis   . Insomnia, unspecified   . Vitamin B deficiency   . Weight loss   . Chronic mental illness   . Arterial tortuosity (aorta) 12/11/2012  . Chronic kidney disease, stage IV (severe) ([place]) 12/11/2012  . Substance abuse     Benzos and Narcotics, she does not get any of those medicines from [facility],  we have empahtically told her that.   . Vertigo   . Colitis   . Schatzki's ring 01/14/2015  . Dysphagia 01/11/2015  . Nasal bone fracture 03/30/2014    Past Surgical History  Procedure Laterality Date  . Cholecystectomy    . Partial hysterectomy    . Abdominal hysterectomy    . Cataract extraction    . Esophagogastroduodenoscopy (egd) with propofol N/A 01/13/2015    Procedure: ESOPHAGOGASTRODUODENOSCOPY (EGD) WITH PROPOFOL;  Surgeon: [name], MD;  Location: [facility];  Service: Endoscopy;  Laterality: N/A;    Family History  Problem Relation Age of Onset  . Stroke Mother   . Diabetes Mother   . Cancer Father 80    esophageal  . Heart disease Mother   . Hearing loss Mother     Social History:  reports that she has quit smoking. Her smoking use included Cigarettes. She smoked 0.50 packs per day. She has never used smokeless tobacco. She reports that she does not drink alcohol or use illicit drugs.  Allergies:  Allergies  Allergen Reactions  . Macrobid [Nitrofurantoin Macrocrystal] Rash  . Azithromycin Other (See Comments)    unknown  . Doxycycline Other (See Comments)    unknown  . Escitalopram Oxalate Other (See Comments)    unknown  . Fioricet [Butalbital-Apap-Caffeine] Other (See Comments)    Drunk.  . Latex Other (See Comments)    unknown  . Levofloxacin Other (See Comments)    unknown  . Nsaids Other (  See Comments)    Allergic reaction not listed on MAR.  Marland Kitchen Oxycodone Other (See Comments)    reaction to synthetic codeine  . Restasis [Cyclosporine] Other (See Comments)    unknown  . Sulfa Antibiotics Other (See Comments)    Reaction unknown  . Tylenol [Acetaminophen] Other (See Comments)    jaundice  . Biaxin [Clarithromycin] Rash    With burning sensation    Medications; Prior to Admission medications   Medication Sig Start Date End Date Taking? Authorizing Provider  acetaminophen (TYLENOL) 325 MG tablet Take 650 mg by mouth every 6 (six) hours  as needed for headache.   Yes Historical Provider, MD  Acetic Acid (DOUCHE VINEGAR/WATER VA) Place 1 each around the anus once a week. To prevent UTI   Yes Historical Provider, MD  aspirin 81 MG chewable tablet Chew 1 tablet (81 mg total) by mouth daily. 12/12/12  Yes [name], MD  Cholecalciferol (VITAMIN D) 1000 UNITS capsule Take 1,000 Units by mouth daily.   Yes Historical Provider, MD  clorazepate (TRANXENE) 7.5 MG tablet Take 7.5 mg by mouth 2 (two) times daily.  12/23/12  Yes Estill Dooms, MD  Cranberry 425 MG CAPS Take 425 mg by mouth 2 (two) times daily.    Yes Historical Provider, MD  diclofenac (FLECTOR) 1.3 % PTCH Place 1 patch onto the skin every 12 (twelve) hours as needed (For localized pain.).    Yes Historical Provider, MD  divalproex (DEPAKOTE) 250 MG DR tablet Take 250 mg by mouth daily.   Yes Historical Provider, MD  docusate sodium (COLACE) 100 MG capsule Take 100 mg by mouth 2 (two) times daily.   Yes Historical Provider, MD  dorzolamide-timolol (COSOPT) 22.3-6.8 MG/ML ophthalmic solution Place 1 drop into both eyes 2 (two) times daily.   Yes Historical Provider, MD  DULoxetine (CYMBALTA) 30 MG capsule Take 30 mg by mouth daily.   Yes Historical Provider, MD  fluconazole (DIFLUCAN) 100 MG tablet Take 100 mg by mouth daily.   Yes Historical Provider, MD  fludrocortisone (FLORINEF) 0.1 MG tablet Take 0.1 mg by mouth every morning.   Yes Historical Provider, MD  fluticasone (FLOVENT HFA) 110 MCG/ACT inhaler Inhale 1 puff into the lungs 2 (two) times daily as needed (shortness of breath.).   Yes Historical Provider, MD  Ginkgo Biloba 120 MG CAPS Take 120 mg by mouth 2 (two) times daily.    Yes Historical Provider, MD  HYDROcodone-acetaminophen (NORCO) 10-325 MG per tablet Take 1 tablet by mouth every 6 (six) hours as needed (pain). Patient taking differently: Take 1 tablet by mouth every 8 (eight) hours. Scheduled at 6am, 12 noon, and 6pm. And may take 1 tablet at 12  midnight as needed for pain 04/02/14  Yes Theodis Blaze, MD  hydrocortisone (ANUSOL-HC) 2.5 % rectal cream Place 1 application rectally 2 (two) times daily as needed for hemorrhoids or itching.   Yes Historical Provider, MD  LORazepam (ATIVAN) 0.5 MG tablet Take 0.5 mg by mouth daily. May also take a full tablet twice daily as needed for anxiety   Yes Historical Provider, MD  meclizine (ANTIVERT) 25 MG tablet Take 25 mg by mouth 3 (three) times daily as needed for dizziness. Do not combine with Hydroxyzine or any other antihistamine. 11/12/13  Yes Thao P Le, DO  Melatonin 1 MG TABS Take 1 mg by mouth at bedtime.    Yes Historical Provider, MD  mineral oil-hydrophilic petrolatum (AQUAPHOR) ointment Apply 1 application topically daily.  Yes Historical Provider, MD  mirtazapine (REMERON) 45 MG tablet Take 45 mg by mouth at bedtime.   Yes Historical Provider, MD  nitroGLYCERIN (NITROSTAT) 0.4 MG SL tablet Place 0.4 mg under the tongue every 5 (five) minutes as needed for chest pain. May use 3 times   Yes Historical Provider, MD  nystatin (MYCOSTATIN) 100000 UNIT/ML suspension Take 5 mLs by mouth 3 (three) times daily as needed (mouth pain).    Yes Historical Provider, MD  omeprazole (PRILOSEC) 20 MG capsule Take 20 mg by mouth daily.   Yes Historical Provider, MD  ondansetron (ZOFRAN) 4 MG tablet Take 1 tablet (4 mg total) by mouth every 6 (six) hours. Patient taking differently: Take 4 mg by mouth every 4 (four) hours as needed for nausea or vomiting.  12/25/14  Yes Hanna Patel-Mills, PA-C  polyethylene glycol (MIRALAX / GLYCOLAX) packet Take 17 g by mouth daily as needed for mild constipation or moderate constipation.    Yes Historical Provider, MD  PRESCRIPTION MEDICATION Take 1 Container by mouth 2 (two) times daily between meals. Med Pass   Yes Historical Provider, MD  QUEtiapine Fumarate (SEROQUEL XR) 150 MG 24 hr tablet Take 150 mg by mouth daily at 8 pm.   Yes Historical Provider, MD  Sennosides  (SENNA LAX PO) Take 2 tablets by mouth 2 (two) times daily. Take 2 tablets in the morning and take 2 tablets at bedtime.   Yes Historical Provider, MD  travoprost, benzalkonium, (TRAVATAN) 0.004 % ophthalmic solution Place 1 drop into both eyes at bedtime.   Yes Historical Provider, MD  triamcinolone (NASACORT ALLERGY 24HR) 55 MCG/ACT AERO nasal inhaler Place 2 sprays into the nose daily as needed (allergies).   Yes Historical Provider, MD  triamcinolone cream (KENALOG) 0.1 % Apply 1 application topically 3 (three) times daily as needed (worsening symtoms then resume as needed).   Yes Historical Provider, MD  triazolam (HALCION) 0.25 MG tablet Take 0.25 mg by mouth at bedtime.   Yes Historical Provider, MD  pantoprazole (PROTONIX) 40 MG tablet Take 1 tablet (40 mg total) by mouth daily at 6 (six) AM. Patient not taking: Reported on 01/16/2015 01/14/15   [name], MD   . ampicillin-sulbactam (UNASYN) IV  3 g Intravenous Q6H  . clorazepate  7.5 mg Oral BID  . divalproex  250 mg Oral Daily  . dorzolamide-timolol  1 drop Both Eyes BID  . DULoxetine  30 mg Oral Daily  . fluconazole  100 mg Oral Daily  . fludrocortisone  0.1 mg Oral q morning - 10a  . ipratropium-albuterol  3 mL Nebulization TID  . latanoprost  1 drop Both Eyes QHS  . mirtazapine  30 mg Oral QHS  . pantoprazole (PROTONIX) IV  40 mg Intravenous Q24H  . QUEtiapine Fumarate  150 mg Oral Q2000  . sodium chloride  3 mL Intravenous Q12H  . sodium chloride  3 mL Intravenous Q12H   PRN Meds sodium chloride, acetaminophen, budesonide (PULMICORT) nebulizer solution, fluticasone, hydrocortisone, nitroGLYCERIN, ondansetron **OR** ondansetron (ZOFRAN) IV, ondansetron (ZOFRAN) IV, sodium chloride Results for orders placed or performed during the hospital encounter of 01/16/15 (from the past 48 hour(s))  CBC with Differential     Status: None   Collection Time: 01/16/15  8:59 PM  Result Value Ref Range   WBC 7.9 4.0 - 10.5 K/uL   RBC  4.15 3.87 - 5.11 MIL/uL   Hemoglobin 12.2 12.0 - 15.0 g/dL   HCT 38.6 36.0 - 46.0 %  MCV 93.0 78.0 - 100.0 fL   MCH 29.4 26.0 - 34.0 pg   MCHC 31.6 30.0 - 36.0 g/dL   RDW 14.5 11.5 - 15.5 %   Platelets 206 150 - 400 K/uL   Neutrophils Relative % 76 %   Lymphocytes Relative 14 %   Monocytes Relative 8 %   Eosinophils Relative 2 %   Basophils Relative 0 %   Neutro Abs 6.0 1.7 - 7.7 K/uL   Lymphs Abs 1.1 0.7 - 4.0 K/uL   Monocytes Absolute 0.6 0.1 - 1.0 K/uL   Eosinophils Absolute 0.2 0.0 - 0.7 K/uL   Basophils Absolute 0.0 0.0 - 0.1 K/uL   Smear Review MORPHOLOGY UNREMARKABLE   Comprehensive metabolic panel     Status: Abnormal   Collection Time: 01/16/15  8:59 PM  Result Value Ref Range   Sodium 143 135 - 145 mmol/L   Potassium 3.0 (L) 3.5 - 5.1 mmol/L   Chloride 108 101 - 111 mmol/L   CO2 27 22 - 32 mmol/L   Glucose, Bld 134 (H) 65 - 99 mg/dL   BUN 17 6 - 20 mg/dL   Creatinine, Ser 0.66 0.44 - 1.00 mg/dL   Calcium 8.5 (L) 8.9 - 10.3 mg/dL   Total Protein 6.4 (L) 6.5 - 8.1 g/dL   Albumin 3.2 (L) 3.5 - 5.0 g/dL   AST 23 15 - 41 U/L   ALT 16 14 - 54 U/L   Alkaline Phosphatase 84 38 - 126 U/L   Total Bilirubin 0.5 0.3 - 1.2 mg/dL   GFR calc non Af Amer >60 >60 mL/min   GFR calc Af Amer >60 >60 mL/min    Comment: (NOTE) The eGFR has been calculated using the CKD EPI equation. This calculation has not been validated in all clinical situations. eGFR's persistently <60 mL/min signify possible Chronic Kidney Disease.    Anion gap 8 5 - 15  Lipase, blood     Status: None   Collection Time: 01/16/15  8:59 PM  Result Value Ref Range   Lipase 23 11 - 51 U/L  Magnesium     Status: None   Collection Time: 01/16/15  8:59 PM  Result Value Ref Range   Magnesium 1.8 1.7 - 2.4 mg/dL  Phosphorus     Status: None   Collection Time: 01/16/15  8:59 PM  Result Value Ref Range   Phosphorus 3.3 2.5 - 4.6 mg/dL  Urinalysis, Routine w reflex microscopic (not at [facility])     Status: Abnormal    Collection Time: 01/17/15 12:32 AM  Result Value Ref Range   Color, Urine AMBER (A) YELLOW    Comment: BIOCHEMICALS MAY BE AFFECTED BY COLOR   APPearance CLOUDY (A) CLEAR   Specific Gravity, Urine 1.025 1.005 - 1.030   pH 6.0 5.0 - 8.0   Glucose, UA NEGATIVE NEGATIVE mg/dL   Hgb urine dipstick NEGATIVE NEGATIVE   Bilirubin Urine SMALL (A) NEGATIVE   Ketones, ur NEGATIVE NEGATIVE mg/dL   Protein, ur NEGATIVE NEGATIVE mg/dL   Nitrite NEGATIVE NEGATIVE   Leukocytes, UA LARGE (A) NEGATIVE  Urine microscopic-add on     Status: Abnormal   Collection Time: 01/17/15 12:32 AM  Result Value Ref Range   Squamous Epithelial / LPF 0-5 (A) NONE SEEN   WBC, UA TOO NUMEROUS TO COUNT 0 - 5 WBC/hpf   RBC / HPF 0-5 0 - 5 RBC/hpf   Bacteria, UA MANY (A) NONE SEEN  Strep pneumoniae urinary antigen  Status: None   Collection Time: 01/17/15 12:35 AM  Result Value Ref Range   Strep Pneumo Urinary Antigen NEGATIVE NEGATIVE    Comment:        Infection due to S. pneumoniae cannot be absolutely ruled out since the antigen present may be below the detection limit of the test.   CBC WITH DIFFERENTIAL     Status: Abnormal   Collection Time: 01/17/15  2:23 AM  Result Value Ref Range   WBC 6.9 4.0 - 10.5 K/uL   RBC 3.94 3.87 - 5.11 MIL/uL   Hemoglobin 11.7 (L) 12.0 - 15.0 g/dL   HCT 36.7 36.0 - 46.0 %   MCV 93.1 78.0 - 100.0 fL   MCH 29.7 26.0 - 34.0 pg   MCHC 31.9 30.0 - 36.0 g/dL   RDW 14.4 11.5 - 15.5 %   Platelets 207 150 - 400 K/uL   Neutrophils Relative % 69 %   Neutro Abs 4.8 1.7 - 7.7 K/uL   Lymphocytes Relative 20 %   Lymphs Abs 1.4 0.7 - 4.0 K/uL   Monocytes Relative 9 %   Monocytes Absolute 0.6 0.1 - 1.0 K/uL   Eosinophils Relative 2 %   Eosinophils Absolute 0.1 0.0 - 0.7 K/uL   Basophils Relative 0 %   Basophils Absolute 0.0 0.0 - 0.1 K/uL  Comprehensive metabolic panel     Status: Abnormal   Collection Time: 01/17/15  2:23 AM  Result Value Ref Range   Sodium 145 135 - 145  mmol/L   Potassium 3.0 (L) 3.5 - 5.1 mmol/L   Chloride 109 101 - 111 mmol/L   CO2 27 22 - 32 mmol/L   Glucose, Bld 104 (H) 65 - 99 mg/dL   BUN 16 6 - 20 mg/dL   Creatinine, Ser 0.63 0.44 - 1.00 mg/dL   Calcium 8.2 (L) 8.9 - 10.3 mg/dL   Total Protein 6.2 (L) 6.5 - 8.1 g/dL   Albumin 3.2 (L) 3.5 - 5.0 g/dL   AST 21 15 - 41 U/L   ALT 14 14 - 54 U/L   Alkaline Phosphatase 77 38 - 126 U/L   Total Bilirubin 0.6 0.3 - 1.2 mg/dL   GFR calc non Af Amer >60 >60 mL/min   GFR calc Af Amer >60 >60 mL/min    Comment: (NOTE) The eGFR has been calculated using the CKD EPI equation. This calculation has not been validated in all clinical situations. eGFR's persistently <60 mL/min signify possible Chronic Kidney Disease.    Anion gap 9 5 - 15  MRSA PCR Screening     Status: None   Collection Time: 01/17/15  3:04 AM  Result Value Ref Range   MRSA by PCR NEGATIVE NEGATIVE    Comment:        The GeneXpert MRSA Assay (FDA approved for NASAL specimens only), is one component of a comprehensive MRSA colonization surveillance program. It is not intended to diagnose MRSA infection nor to guide or monitor treatment for MRSA infections.     Dg Chest 2 View  01/16/2015  CLINICAL DATA:  Shortness of breath.  COPD. EXAM: CHEST  2 VIEW COMPARISON:  01/12/2015. FINDINGS: Interval small left pleural effusion and mild increase in patchy density at the left lung base. There is also interval increased density at the posterior right lung base. The lungs remain mildly hyperexpanded with mild diffuse peribronchial thickening and accentuation of the interstitial markings. The heart remains normal in size. Mild scoliosis is unchanged. IMPRESSION: 1. Interval  small left pleural effusion. 2. Increased left basilar pneumonia or atelectasis. 3. Interval probable atelectasis at the posterior right lung base. Electronically Signed   By: Claudie Revering M.D.   On: 01/16/2015 20:50   Dg Hip Unilat With Pelvis 2-3 Views  Left  01/16/2015  CLINICAL DATA:  [place] tonight.  Left hip pain. EXAM: DG HIP (WITH OR WITHOUT PELVIS) 2-3V LEFT COMPARISON:  CT scan 01/11/2015. FINDINGS: Both hips are normally located. Mild degenerative changes bilaterally. No acute hip fracture is identified. No plain film findings for avascular necrosis. The pubic symphysis and SI joints are intact. No pelvic fractures are identified. IMPRESSION: Mild bilateral hip joint degenerative changes but no acute hip or pelvic fracture. Electronically Signed   By: Marijo Sanes M.D.   On: 01/16/2015 20:47              Blood pressure 147/81, pulse 85, temperature 97.9 F (36.6 C), temperature source Axillary, resp. rate 18, height 5' (1.524 m), weight 53.751 kg (118 lb 8 oz), SpO2 96 %.  Physical exam:   General-- patient is alert but appears confused. She does answer questions and appears comfortable ENT-- extraocular movements intact oral mucous membranes appear to be dry  Heart-- regular rate and rhythm without murmurs or gallops Lungs-- clear Abdomen-- mild epigastric tenderness generally soft and nontender. Psych--   Assessment: 1. Nausea and vomiting. It is unclear if she is still having this problem. She clearly has some esophageal motility disorder in my suspicion is that she likely has early achalasia. There was no material in her esophagus at the time of EGD although her esophagus was full of liquid and solid at the time of her initial admission last week. She likely needs manometry at some point 2. Esophageal dysmotility. Possible achalasia still to be determined 3. Left lobe pneumonia. This could've been a result of aspiration during her tissues swallowing and/or endoscopy. 4. History of chronic mental illness  Plan: we will go ahead and follow with you. I would prefer to treat the pneumonia for several days, obtain barium swallow with pill when she is more alert and is not have any solid food or liquid in her esophagus to try  to assess the motility, and she will eventually need esophageal manometry to evaluate for achalasia.   Berdell Nevitt JR,Chace Bisch L 01/17/2015, 10:05 AM   Pager: [phone] If no answer or after hours call [phone]

## 2015-01-17 NOTE — Evaluation (Signed)
Clinical/Bedside Swallow Evaluation Patient Details  Name: Taylor Roth MRN: [id number] Date of Birth: 26-Feb-1928  Today's Date: 01/17/2015 Time: SLP Start Time (ACUTE ONLY): 1440 SLP Stop Time (ACUTE ONLY): 1500 SLP Time Calculation (min) (ACUTE ONLY): 20 min  Past Medical History:  Past Medical History  Diagnosis Date  . Osteoporosis   . Depression   . Anxiety   . COPD (chronic obstructive pulmonary disease) ([place])   . GERD (gastroesophageal reflux disease)   . Abuse     benzos/narcotics  . Migraines   . Scoliosis   . Insomnia, unspecified   . Vitamin B deficiency   . Weight loss   . Chronic mental illness   . Arterial tortuosity (aorta) 12/11/2012  . Chronic kidney disease, stage IV (severe) ([place]) 12/11/2012  . Substance abuse     Benzos and Narcotics, she does not get any of those medicines from [facility], we have empahtically told her that.   . Vertigo   . Colitis   . Schatzki's ring 01/14/2015  . Dysphagia 01/11/2015  . Nasal bone fracture 03/30/2014   Past Surgical History:  Past Surgical History  Procedure Laterality Date  . Cholecystectomy    . Partial hysterectomy    . Abdominal hysterectomy    . Cataract extraction    . Esophagogastroduodenoscopy (egd) with propofol N/A 01/13/2015    Procedure: ESOPHAGOGASTRODUODENOSCOPY (EGD) WITH PROPOFOL;  Surgeon: [name], MD;  Location: [facility];  Service: Endoscopy;  Laterality: N/A;   HPI:  Pt admitted with probable aspiration pna. Pt was seen in ED one week prior with question of an obstructed esophagus. Underwent esophagram showing. residual PO in dilated esophagus. EGD showed a nonobstructive Schatzki's ring, tortuous esophagus but no clear stricture and appeared from the pictures to have a fairly wide open GE junction but marked angulation of the esophagus where food could be getting hung per GI. Biopsies were negative for H. Pylori. Per MD notes, pt was tolerating soft solids at d/c. Pt reports that since d/c  she had not been given PO at home. GI has been consulted for nausea and vomiting and reports suspected achalasia. Is planning to treat pna, and do a barium swallow when mentation has improved, followed by eventual manometry. Pt was seen by SLP on 10/29, found to have normal swallow function, but required a soft diet.    Assessment / Plan / Recommendation Clinical Impression  Pt demonstrates no evidence of oral or oropharyngeal dysphagia. Swallow is slightly audible, as observed with prior SLP visits. Pt would not accept more than two bites of puree and did not have denture present to test solids, so it is difficult to determine if esophageal function would prevent tolerance of puree diet. Given good airway protection, recommend upgrade to dys 1 (puree) with basic esophageal precautions if MD in agreement. Paged GI MD, no response. No further SLP f/u needed. Will sign off.     Aspiration Risk  Mild aspiration risk    Diet Recommendation     Medication Administration: Crushed with puree    Other  Recommendations Oral Care Recommendations: Oral care BID   Follow up Recommendations  None    Frequency and Duration            Prognosis        Swallow Study   General HPI: Pt admitted with probable aspiration pna. Pt was seen in ED one week prior with question of an obstructed esophagus. Underwent esophagram showing. residual PO in dilated esophagus. EGD  showed a nonobstructive Schatzki's ring, tortuous esophagus but no clear stricture and appeared from the pictures to have a fairly wide open GE junction but marked angulation of the esophagus where food could be getting hung per GI. Biopsies were negative for H. Pylori. Per MD notes, pt was tolerating soft solids at d/c. Pt reports that since d/c she had not been given PO at home. GI has been consulted for nausea and vomiting and reports suspected achalasia. Is planning to treat pna, and do a barium swallow when mentation has improved, followed by  eventual manometry. Pt was seen by SLP on 10/29, found to have normal swallow function, but required a soft diet.  Type of Study: Bedside Swallow Evaluation Diet Prior to this Study: Thin liquids Temperature Spikes Noted: Yes Respiratory Status: Room air History of Recent Intubation: No Behavior/Cognition: Alert Oral Cavity Assessment: Within Functional Limits Oral Care Completed by SLP: No Oral Cavity - Dentition: Dentures, not available;Edentulous Vision: Impaired for self-feeding Self-Feeding Abilities: Needs assist Patient Positioning: Upright in bed Baseline Vocal Quality: Normal Volitional Cough: Strong Volitional Swallow: Able to elicit    Oral/Motor/Sensory Function Overall Oral Motor/Sensory Function: Within functional limits   Ice Chips     Thin Liquid Thin Liquid: Within functional limits    Nectar Thick Nectar Thick Liquid: Not tested   Honey Thick Honey Thick Liquid: Not tested   Puree Puree: Within functional limits   Solid Solid: Not tested      Herbie Baltimore, MA CCC-SLP [id number]  Lynann Beaver 01/17/2015,3:05 PM

## 2015-01-17 NOTE — Progress Notes (Signed)
PROGRESS NOTE  MANASVINI DELKER [id number] DOB: 1929/02/10 DOA: 01/16/2015 PCP: Pcp Not In System  HPI/Recap of past 24 hours:  Follow command, only oriented to self, no vomiting or diarrhea observed since being admitted  Assessment/Plan: Principal Problem:   Aspiration pneumonia ([place]) Active Problems:   COPD (chronic obstructive pulmonary disease) (HCC)   Glaucoma   Hypokalemia   Schatzki's ring   CKD (chronic kidney disease), stage III   Nausea and vomiting  Fever , 100.9 on admission, from aspiration pna? Uti? On abx. Culture pending, no leukocytosis, lactic acid wnl.   Aspiration pneumonia ([place]) With hypoxia in ED , 88% on room air per note. Admit to telemetry, supplemental oxygen, bronchodilators. Unasyn  From admission for suspected aspiration pneumonia. Patient does not cough, not able to obtain sputum sample Legionella urine antigen pending and negative strep pneumoniae urine antigens. Swallow eval pending, aspiration precaution.   UTI; per family patient was recently treated for uti a few weeks ago with keflex, patient usually has symptom with odor and dysuria. Patient is not able to provide history since being admitted to the hospital this time. Urine culture pending on abx.  Metabolic encephalopathy: per daughter patient is aaox3 when not sick, today she is oriented to person only and very lethargic,  treat underline illness.  Active Problems:  Schatzki's ring  Nausea and vomiting Continue proton pump inhibitor and antiemetics. Recent egd on 12/1, biopsy negative H pylori. Reported persistent n/v,  GI consulted, input appreciated   COPD (chronic obstructive pulmonary disease) ([place]) Continue supplemental oxygen and bronchodilators. No wheezing, no respiratory distress   Glaucoma Continue current topical drops.   Hypokalemia Replaced potassium. Check mag    CKD (chronic kidney disease), stage III Monitor electrolytes, BUN and creatinine.    History of depression, anxiety Continue duloxetine, mirtazapine, Seroquel and anxiolytics as needed.  FTT: PT, may need home health vs SNF at discharge.  H/o severe malnutrition and underweight, per daughter patient has since gained weight, will check prealbumin.  HTN; patient was previously on florinef, will check orthostatic vital sign and am cortisol level, consider d/c florinef if parameter wnl.  DVT Prophylaxis: Mechanical with SCDs. And lovenox  Code Status: partial: no intubation. Confirmed with daughter   Family Communication: patient and daughter over the phone  Disposition Plan: remain in the hospital , she is from assisted living, may need home health or snf at time of discharge depend on improvment   Consultants:  GI  Procedures:  none  Antibiotics:  unasyn   Objective: BP 147/81 mmHg  Pulse 85  Temp(Src) 97.9 F (36.6 C) (Axillary)  Resp 18  Ht 5' (1.524 m)  Wt 118 lb 8 oz (53.751 kg)  BMI 23.14 kg/m2  SpO2 96%  Intake/Output Summary (Last 24 hours) at 01/17/15 1115 Last data filed at 01/17/15 1100  Gross per 24 hour  Intake 1136.25 ml  Output      0 ml  Net 1136.25 ml   Filed Weights   01/17/15 0135  Weight: 118 lb 8 oz (53.751 kg)    Exam:   General:  Frail, confused, lethargic, NAD, denies pain, following command  Cardiovascular: RRR  Respiratory: CTABL  Abdomen: Soft/ND/NT, positive BS  Musculoskeletal: No Edema  Neuro: confused, no focal deficit  Data Reviewed: Basic Metabolic Panel:  Recent Labs Lab 01/11/15 1430 01/14/15 0530 01/16/15 2059 01/17/15 0223  NA 142 140 143 145  K 3.7 3.2* 3.0* 3.0*  CL 108 108 108 109  CO2 28  26 27 27   GLUCOSE 105* 106* 134* 104*  BUN 19 13 17 16   CREATININE 0.89 0.75 0.66 0.63  CALCIUM 8.7* 8.5* 8.5* 8.2*  MG  --   --  1.8  --   PHOS  --   --  3.3  --    Liver Function Tests:  Recent Labs Lab 01/11/15 1430 01/16/15 2059 01/17/15 0223  AST 27 23 21   ALT 16 16 14   ALKPHOS  118 84 77  BILITOT 0.1* 0.5 0.6  PROT 6.6 6.4* 6.2*  ALBUMIN 3.9 3.2* 3.2*    Recent Labs Lab 01/11/15 1430 01/16/15 2059  LIPASE 23 23   No results for input(s): AMMONIA in the last 168 hours. CBC:  Recent Labs Lab 01/11/15 1430 01/14/15 0530 01/16/15 2059 01/17/15 0223  WBC 4.9 6.3 7.9 6.9  NEUTROABS 2.5  --  6.0 4.8  HGB 12.1 11.4* 12.2 11.7*  HCT 37.7 34.8* 38.6 36.7  MCV 93.1 92.3 93.0 93.1  PLT 221 180 206 207   Cardiac Enzymes:   No results for input(s): CKTOTAL, CKMB, CKMBINDEX, TROPONINI in the last 168 hours. BNP (last 3 results) No results for input(s): BNP in the last 8760 hours.  ProBNP (last 3 results) No results for input(s): PROBNP in the last 8760 hours.  CBG: No results for input(s): GLUCAP in the last 168 hours.  Recent Results (from the past 240 hour(s))  MRSA PCR Screening     Status: None   Collection Time: 01/17/15  3:04 AM  Result Value Ref Range Status   MRSA by PCR NEGATIVE NEGATIVE Final    Comment:        The GeneXpert MRSA Assay (FDA approved for NASAL specimens only), is one component of a comprehensive MRSA colonization surveillance program. It is not intended to diagnose MRSA infection nor to guide or monitor treatment for MRSA infections.      Studies: Dg Chest 2 View  01/16/2015  CLINICAL DATA:  Shortness of breath.  COPD. EXAM: CHEST  2 VIEW COMPARISON:  01/12/2015. FINDINGS: Interval small left pleural effusion and mild increase in patchy density at the left lung base. There is also interval increased density at the posterior right lung base. The lungs remain mildly hyperexpanded with mild diffuse peribronchial thickening and accentuation of the interstitial markings. The heart remains normal in size. Mild scoliosis is unchanged. IMPRESSION: 1. Interval small left pleural effusion. 2. Increased left basilar pneumonia or atelectasis. 3. Interval probable atelectasis at the posterior right lung base. Electronically Signed    By: Claudie Revering M.D.   On: 01/16/2015 20:50   Dg Hip Unilat With Pelvis 2-3 Views Left  01/16/2015  CLINICAL DATA:  [place] tonight.  Left hip pain. EXAM: DG HIP (WITH OR WITHOUT PELVIS) 2-3V LEFT COMPARISON:  CT scan 01/11/2015. FINDINGS: Both hips are normally located. Mild degenerative changes bilaterally. No acute hip fracture is identified. No plain film findings for avascular necrosis. The pubic symphysis and SI joints are intact. No pelvic fractures are identified. IMPRESSION: Mild bilateral hip joint degenerative changes but no acute hip or pelvic fracture. Electronically Signed   By: Marijo Sanes M.D.   On: 01/16/2015 20:47    Scheduled Meds: . ampicillin-sulbactam (UNASYN) IV  3 g Intravenous Q6H  . clorazepate  7.5 mg Oral BID  . divalproex  250 mg Oral Daily  . dorzolamide-timolol  1 drop Both Eyes BID  . DULoxetine  30 mg Oral Daily  . fluconazole  100 mg Oral Daily  .  fludrocortisone  0.1 mg Oral q morning - 10a  . ipratropium-albuterol  3 mL Nebulization TID  . latanoprost  1 drop Both Eyes QHS  . mineral oil  1 enema Rectal Once  . mirtazapine  30 mg Oral QHS  . pantoprazole (PROTONIX) IV  40 mg Intravenous Q24H  . potassium chloride  10 mEq Intravenous Q1 Hr x 4  . QUEtiapine Fumarate  150 mg Oral Q2000  . sodium chloride  3 mL Intravenous Q12H  . sodium chloride  3 mL Intravenous Q12H    Continuous Infusions: . 0.9 % NaCl with KCl 40 mEq / L 75 mL/hr (01/17/15 0155)     Time spent: 35 mins from 10:40 am to 11:15am, more than 50% of time in coordination of care and communicating with subspecilist and talking to family.  Vito Beg MD, PhD  [name] Pager [phone]. If 7PM-7AM, please contact night-coverage at [email or web address], password [facility] 01/17/2015, 11:15 AM  LOS: 1 day

## 2015-01-18 ENCOUNTER — Inpatient Hospital Stay (HOSPITAL_COMMUNITY): Payer: Medicare Other

## 2015-01-18 LAB — COMPREHENSIVE METABOLIC PANEL
ALT: 13 U/L — AB (ref 14–54)
AST: 18 U/L (ref 15–41)
Albumin: 2.9 g/dL — ABNORMAL LOW (ref 3.5–5.0)
Alkaline Phosphatase: 73 U/L (ref 38–126)
Anion gap: 7 (ref 5–15)
BILIRUBIN TOTAL: 0.5 mg/dL (ref 0.3–1.2)
BUN: 10 mg/dL (ref 6–20)
CHLORIDE: 111 mmol/L (ref 101–111)
CO2: 27 mmol/L (ref 22–32)
CREATININE: 0.71 mg/dL (ref 0.44–1.00)
Calcium: 8.5 mg/dL — ABNORMAL LOW (ref 8.9–10.3)
GFR calc Af Amer: >60 mL/min (ref >60)
GLUCOSE: 93 mg/dL (ref 65–99)
Potassium: 4 mmol/L (ref 3.5–5.1)
Sodium: 145 mmol/L (ref 135–145)
TOTAL PROTEIN: 5.7 g/dL — AB (ref 6.5–8.1)

## 2015-01-18 LAB — CBC WITH DIFFERENTIAL/PLATELET
Basophils Absolute: 0 10*3/uL (ref 0.0–0.1)
Basophils Relative: 0 %
EOS ABS: 0.3 10*3/uL (ref 0.0–0.7)
EOS PCT: 6 %
HCT: 37.5 % (ref 36.0–46.0)
Hemoglobin: 11.8 g/dL — ABNORMAL LOW (ref 12.0–15.0)
LYMPHS ABS: 1.6 10*3/uL (ref 0.7–4.0)
LYMPHS PCT: 33 %
MCH: 29.3 pg (ref 26.0–34.0)
MCHC: 31.5 g/dL (ref 30.0–36.0)
MCV: 93.1 fL (ref 78.0–100.0)
MONO ABS: 0.5 10*3/uL (ref 0.1–1.0)
Monocytes Relative: 10 %
Neutro Abs: 2.5 10*3/uL (ref 1.7–7.7)
Neutrophils Relative %: 51 %
PLATELETS: 204 10*3/uL (ref 150–400)
RBC: 4.03 MIL/uL (ref 3.87–5.11)
RDW: 14.4 % (ref 11.5–15.5)
WBC: 4.9 10*3/uL (ref 4.0–10.5)

## 2015-01-18 LAB — PREALBUMIN: Prealbumin: 9.1 mg/dL — ABNORMAL LOW (ref 18–38)

## 2015-01-18 LAB — MAGNESIUM: MAGNESIUM: 1.7 mg/dL (ref 1.7–2.4)

## 2015-01-18 LAB — CORTISOL: Cortisol, Plasma: 11.5 ug/dL

## 2015-01-18 MED ORDER — MAGNESIUM SULFATE 2 GM/50ML IV SOLN
2.0000 g | Freq: Once | INTRAVENOUS | Status: AC
  Administered 2015-01-18: 2 g via INTRAVENOUS
  Filled 2015-01-18: qty 50

## 2015-01-18 MED ORDER — KCL IN DEXTROSE-NACL 40-5-0.9 MEQ/L-%-% IV SOLN
INTRAVENOUS | Status: AC
  Administered 2015-01-18 – 2015-01-19 (×2): via INTRAVENOUS
  Filled 2015-01-18 (×3): qty 1000

## 2015-01-18 NOTE — Progress Notes (Signed)
PROGRESS NOTE  Taylor Roth [id number] DOB: 02-21-1928 DOA: 01/16/2015 PCP: Pcp Not In System  HPI/Recap of past 24 hours:  aaox3 this am, denies pain or n/v, denies cough or sob.  Assessment/Plan: Principal Problem:   Aspiration pneumonia (HCC) Active Problems:   COPD (chronic obstructive pulmonary disease) (HCC)   Glaucoma   Hypokalemia   Schatzki's ring   CKD (chronic kidney disease), stage III   Nausea and vomiting   Esophageal dysmotilities   Fever, unspecified   Metabolic encephalopathy   Urinary tract infectious disease  Fever ,  100.9 on admission, from aspiration pna? UTI? no leukocytosis, lactic acid wnl.   Blood culture no growth, no cough, mrsa screen negative urine culture perlim: + enterococcus,  On unasyn.  Aspiration pneumonia ([place]) With hypoxia in ED , 88% on room air per note. Admit to telemetry, supplemental oxygen, bronchodilators. Unasyn  From admission for suspected aspiration pneumonia. Patient does not cough, not able to obtain sputum sample Legionella urine antigen pending and negative strep pneumoniae urine antigens. Swallow eval recommended puree diet due to no denture, continue  aspiration precaution. Ordered sitting up in chair for an hour with each meal. Barium swallow on 12/6.  UTI;  per family patient was recently treated for uti a few weeks ago with keflex, patient usually has symptom with odor and dysuria.  Patient herself not able to provide reliable history Urine culture perlim: + enterococcus, f/u on final c/s.  on unasyn  Metabolic encephalopathy:  on admission patient was oriented to person only and very lethargic, per daughter patient is aaox3 when not sick Better aaox3  on 12/6 am.  Active Problems:  Schatzki's ring  Nausea and vomiting Continue proton pump inhibitor and antiemetics.  Recent egd on 12/1, biopsy negative H pylori. Reported persistent n/v,   barium swallow on 12/6 per GI, f/u GI rec's.   COPD  (chronic obstructive pulmonary disease) (HCC) Continue supplemental oxygen and bronchodilators. No wheezing, no respiratory distress   Glaucoma Continue current topical drops.   Hypokalemia Replaced potassium. S/p iv mag to keep mag >2. Currently on d5/ns/40k at 75c/hr for another 24hrs.    CKD (chronic kidney disease), stage III Monitor electrolytes, BUN and creatinine.   History of depression, anxiety Continue duloxetine, mirtazapine, Seroquel and anxiolytics as needed.  FTT:  may need home health vs SNF at discharge, pending Pt eval.  H/o severe malnutrition and underweight,  per daughter patient has since gained weight, prealbumin 9.1, nutrition consulted obtained.  HTN;  patient was previously started on florinef when she was underweight and volume depleted in 03/2014 per chart review her orthostatic vital sign wnl this admission and am cortisol level wnl, will d/c florinef. Monitor bp and orthostatic vital signs.  DVT Prophylaxis: Mechanical with SCDs. And lovenox  Code Status: partial: no intubation. Confirmed with daughter   Family Communication: patient and daughter over the phone  Disposition Plan: remain in the hospital , she is from assisted living, may need home health or snf at time of discharge depend on improvment   Consultants:  GI  Procedures:  Barium swallow 12/6  Antibiotics:  unasyn from 12/5   Objective: BP 143/70 mmHg  Pulse 74  Temp(Src) 98.1 F (36.7 C) (Oral)  Resp 16  Ht 5' (1.524 m)  Wt 118 lb 8 oz (53.751 kg)  BMI 23.14 kg/m2  SpO2 93%  Intake/Output Summary (Last 24 hours) at 01/18/15 1205 Last data filed at 01/18/15 0220  Gross per 24 hour  Intake   1895 ml  Output      0 ml  Net   1895 ml   Filed Weights   01/17/15 0135  Weight: 118 lb 8 oz (53.751 kg)    Exam:   General:  Frail, aaox3, NAD, denies pain, following command, no oral thrush,   Cardiovascular: RRR  Respiratory: CTABL  Abdomen: Soft/ND/NT,  positive BS  Musculoskeletal: No Edema  Neuro: confused, no focal deficit  Data Reviewed: Basic Metabolic Panel:  Recent Labs Lab 01/11/15 1430 01/14/15 0530 01/16/15 2059 01/17/15 0223 01/18/15 0431  NA 142 140 143 145 145  K 3.7 3.2* 3.0* 3.0* 4.0  CL 108 108 108 109 111  CO2 28 26 27 27 27   GLUCOSE 105* 106* 134* 104* 93  BUN 19 13 17 16 10   CREATININE 0.89 0.75 0.66 0.63 0.71  CALCIUM 8.7* 8.5* 8.5* 8.2* 8.5*  MG  --   --  1.8  --  1.7  PHOS  --   --  3.3  --   --    Liver Function Tests:  Recent Labs Lab 01/11/15 1430 01/16/15 2059 01/17/15 0223 01/18/15 0431  AST 27 23 21 18   ALT 16 16 14  13*  ALKPHOS 118 84 77 73  BILITOT 0.1* 0.5 0.6 0.5  PROT 6.6 6.4* 6.2* 5.7*  ALBUMIN 3.9 3.2* 3.2* 2.9*    Recent Labs Lab 01/11/15 1430 01/16/15 2059  LIPASE 23 23   No results for input(s): AMMONIA in the last 168 hours. CBC:  Recent Labs Lab 01/11/15 1430 01/14/15 0530 01/16/15 2059 01/17/15 0223 01/18/15 0431  WBC 4.9 6.3 7.9 6.9 4.9  NEUTROABS 2.5  --  6.0 4.8 2.5  HGB 12.1 11.4* 12.2 11.7* 11.8*  HCT 37.7 34.8* 38.6 36.7 37.5  MCV 93.1 92.3 93.0 93.1 93.1  PLT 221 180 206 207 204   Cardiac Enzymes:   No results for input(s): CKTOTAL, CKMB, CKMBINDEX, TROPONINI in the last 168 hours. BNP (last 3 results) No results for input(s): BNP in the last 8760 hours.  ProBNP (last 3 results) No results for input(s): PROBNP in the last 8760 hours.  CBG: No results for input(s): GLUCAP in the last 168 hours.  Recent Results (from the past 240 hour(s))  Culture, Urine     Status: None (Preliminary result)   Collection Time: 01/17/15 12:32 AM  Result Value Ref Range Status   Specimen Description URINE, CLEAN CATCH  Final   Special Requests NONE  Final   Culture   Final    >=100,000 COLONIES/mL ENTEROCOCCUS SPECIES Performed at [facility]    Report Status PENDING  Incomplete  MRSA PCR Screening     Status: None   Collection Time: 01/17/15   3:04 AM  Result Value Ref Range Status   MRSA by PCR NEGATIVE NEGATIVE Final    Comment:        The GeneXpert MRSA Assay (FDA approved for NASAL specimens only), is one component of a comprehensive MRSA colonization surveillance program. It is not intended to diagnose MRSA infection nor to guide or monitor treatment for MRSA infections.      Studies: No results found.  Scheduled Meds: . ampicillin-sulbactam (UNASYN) IV  3 g Intravenous Q6H  . clorazepate  7.5 mg Oral BID  . divalproex  250 mg Oral Daily  . dorzolamide-timolol  1 drop Both Eyes BID  . DULoxetine  30 mg Oral Daily  . enoxaparin (LOVENOX) injection  40 mg Subcutaneous Q24H  .  fluconazole  100 mg Oral Daily  . HYDROcodone-acetaminophen  1 tablet Oral 3 times per day  . ipratropium-albuterol  3 mL Nebulization TID  . latanoprost  1 drop Both Eyes QHS  . LORazepam  0.5 mg Oral Daily  . mineral oil  1 enema Rectal Once  . mirtazapine  30 mg Oral QHS  . pantoprazole (PROTONIX) IV  40 mg Intravenous Q24H  . QUEtiapine Fumarate  150 mg Oral Q2000  . sodium chloride  3 mL Intravenous Q12H  . sodium chloride  3 mL Intravenous Q12H    Continuous Infusions: . dextrose 5 % and 0.9 % NaCl with KCl 40 mEq/L       Time spent: 35 mins   Francena Zender MD, PhD  [name] Pager [phone]. If 7PM-7AM, please contact night-coverage at [email or web address], password [facility] 01/18/2015, 12:05 PM  LOS: 2 days

## 2015-01-18 NOTE — Progress Notes (Signed)
[facility] PROGRESS NOTE Subjective Pt denies trouble swallowing, SP saw her no oropharyngeal dysphagia. Appears to be issue about her teeth  Objective: Vital signs in last 24 hours: Temp:  [97.4 F (36.3 C)-98.1 F (36.7 C)] 98.1 F (36.7 C) (12/06 0510) Pulse Rate:  [71-79] 74 (12/06 0510) Resp:  [16-18] 16 (12/06 0510) BP: (138-185)/(67-86) 143/70 mmHg (12/06 0553) SpO2:  [96 %-99 %] 97 % (12/06 0510) Last BM Date: 01/17/15  Intake/Output from previous day: 12/05 0701 - 12/06 0700 In: 2075 [P.O.:300; I.V.:1275; IV Piggyback:500] Out: -  Intake/Output this shift:      Lab Results:  Recent Labs  01/16/15 2059 01/17/15 0223 01/18/15 0431  WBC 7.9 6.9 4.9  HGB 12.2 11.7* 11.8*  HCT 38.6 36.7 37.5  PLT 206 207 204   BMET  Recent Labs  01/16/15 2059 01/17/15 0223 01/18/15 0431  NA 143 145 145  K 3.0* 3.0* 4.0  CL 108 109 111  CO2 27 27 27   CREATININE 0.66 0.63 0.71   LFT  Recent Labs  01/16/15 2059 01/17/15 0223 01/18/15 0431  PROT 6.4* 6.2* 5.7*  AST 23 21 18   ALT 16 14 13*  ALKPHOS 84 77 73  BILITOT 0.5 0.6 0.5   PT/INR No results for input(s): LABPROT, INR in the last 72 hours. PANCREAS  Recent Labs  01/16/15 2059  LIPASE 23         Studies/Results: Dg Chest 2 View  01/16/2015  CLINICAL DATA:  Shortness of breath.  COPD. EXAM: CHEST  2 VIEW COMPARISON:  01/12/2015. FINDINGS: Interval small left pleural effusion and mild increase in patchy density at the left lung base. There is also interval increased density at the posterior right lung base. The lungs remain mildly hyperexpanded with mild diffuse peribronchial thickening and accentuation of the interstitial markings. The heart remains normal in size. Mild scoliosis is unchanged. IMPRESSION: 1. Interval small left pleural effusion. 2. Increased left basilar pneumonia or atelectasis. 3. Interval probable atelectasis at the posterior right lung base. Electronically Signed   By:  Claudie Revering M.D.   On: 01/16/2015 20:50   Dg Hip Unilat With Pelvis 2-3 Views Left  01/16/2015  CLINICAL DATA:  [place] tonight.  Left hip pain. EXAM: DG HIP (WITH OR WITHOUT PELVIS) 2-3V LEFT COMPARISON:  CT scan 01/11/2015. FINDINGS: Both hips are normally located. Mild degenerative changes bilaterally. No acute hip fracture is identified. No plain film findings for avascular necrosis. The pubic symphysis and SI joints are intact. No pelvic fractures are identified. IMPRESSION: Mild bilateral hip joint degenerative changes but no acute hip or pelvic fracture. Electronically Signed   By: Marijo Sanes M.D.   On: 01/16/2015 20:47    Medications: I have reviewed the patient's current medications.  Assessment/Plan: 1. Aspiration Pneumonia 2. Dysphagia. Probably contributing to #1. Per EGD very tortuous esophagus with prominent "kink" above GEJ but no significant stricture.  I will get a formal barium swallow with pill while she is doing well and would keep her on a pureed diet since she doesn't always have her teeth for some reason.   Atticus Lemberger JR,Magalie Almon L 01/18/2015, 7:53 AM  Pager: [phone] If no answer or after hours call [phone]

## 2015-01-18 NOTE — Evaluation (Signed)
Physical Therapy Evaluation Patient Details Name: Taylor Roth MRN: [id number] DOB: 10/12/1928 Today's Date: 01/18/2015   History of Present Illness  81 y.o. female returns to the hospital from [facility] facility due to nausea and emesis 2 days after being discharged for dysphagia secondary to a nonobstructive Schatzki's ring. PMH significant for depression, COPD, abuse, GERD, migranes, scoliosis, and CKD stage IV   Clinical Impression  Pt admitted with above diagnosis. Pt currently with functional limitations due to the deficits listed below (see PT Problem List). Pt will benefit from skilled PT to increase their independence and safety with mobility to allow discharge to the venue listed below. Pt did well with physical therapy today, however, demonstrated generalized weakness and will benefit from HHPT back at ALF.        Follow Up Recommendations Home health PT;Supervision for mobility/OOB    Equipment Recommendations  None recommended by PT    Recommendations for Other Services       Precautions / Restrictions Precautions Precautions: Fall Precaution Comments: pt is blind Restrictions Weight Bearing Restrictions: No      Mobility  Bed Mobility Overal bed mobility: Needs Assistance Bed Mobility: Supine to Sit     Supine to sit: Min guard     General bed mobility comments: min guard for safety, min cues for correct technique  Transfers Overall transfer level: Needs assistance Equipment used: Rolling walker (2 wheeled) Transfers: Sit to/from Stand Sit to Stand: Min assist         General transfer comment: min assist with RW positioning, cues for hand placement to self-assist with trunk elevation   Ambulation/Gait Ambulation/Gait assistance: Min assist Ambulation Distance (Feet): 180 Feet Assistive device: Rolling walker (2 wheeled) Gait Pattern/deviations: Step-through pattern;Decreased stride length;Drifts right/left;Trunk flexed     General Gait  Details: min assist with RW, pt tends to bump into the wall with RW possibly d/t blindness; cues for staying close to the walker     Stairs            Wheelchair Mobility    Modified Rankin (Stroke Patients Only)       Balance                                             Pertinent Vitals/Pain Pain Assessment: No/denies pain    Home Living Family/patient expects to be discharged to:: Assisted living               Home Equipment: Kasandra Knudsen - single point;Walker - 4 wheels      Prior Function Level of Independence: Independent with assistive device(s)         Comments: pt uses rollator for ambulation      Hand Dominance        Extremity/Trunk Assessment   Upper Extremity Assessment: Generalized weakness           Lower Extremity Assessment: Generalized weakness      Cervical / Trunk Assessment: Other exceptions  Communication   Communication: No difficulties  Cognition Arousal/Alertness: Awake/alert Behavior During Therapy: WFL for tasks assessed/performed Overall Cognitive Status: Within Functional Limits for tasks assessed                      General Comments      Exercises        Assessment/Plan    PT Assessment Patient  needs continued PT services  PT Diagnosis Difficulty walking;Abnormality of gait;Generalized weakness   PT Problem List Decreased activity tolerance;Decreased strength;Decreased mobility;Decreased knowledge of precautions;Decreased knowledge of use of DME  PT Treatment Interventions DME instruction;Gait training;Functional mobility training;Therapeutic activities;Therapeutic exercise;Patient/family education   PT Goals (Current goals can be found in the Care Plan section) Acute Rehab PT Goals Patient Stated Goal: to find new ALF (pt states she is unhappy with her current ALF and wants to find a new facility) PT Goal Formulation: With patient Time For Goal Achievement: 02/08/15 Potential to  Achieve Goals: Good    Frequency Min 3X/week   Barriers to discharge        Co-evaluation               End of Session Equipment Utilized During Treatment: Gait belt Activity Tolerance: Patient tolerated treatment well Patient left: in chair;with call bell/phone within reach;with chair alarm set Nurse Communication: Mobility status         Time: [id number] PT Time Calculation (min) (ACUTE ONLY): 22 min   Charges:   PT Evaluation $Initial PT Evaluation Tier I: 1 Procedure     PT G Codes:        Kaylib Furness 2015-02-03, 4:40 PM

## 2015-01-18 NOTE — NC FL2 (Signed)
[place] LEVEL OF CARE SCREENING TOOL     IDENTIFICATION  Patient Name: Taylor Roth Birthdate: [birth year] Sex: female Admission Date (Current Location): 01/16/2015  [facility] and [place] Number:     Facility and Address:  [facility],  [place] [address], [place]      Provider Number: [id number]  Attending Physician Name and Address:  Florencia Reasons, MD  Relative Name and Phone Number:       Current Level of Care: Hospital Recommended Level of Care: [place] Prior Approval Number:    Date Approved/Denied:   PASRR Number:    Discharge Plan: Other (Comment) (ALF)    Current Diagnoses: Patient Active Problem List   Diagnosis Date Noted  . Nausea and vomiting 01/17/2015  . Esophageal dysmotilities   . Fever, unspecified   . Metabolic encephalopathy   . Urinary tract infectious disease   . Aspiration pneumonia ([place]) 01/16/2015  . Weakness 01/14/2015  . Schatzki's ring 01/14/2015  . CKD (chronic kidney disease), stage III 01/14/2015  . Esophagus disorder   . Dysphagia 01/11/2015  . UTI (lower urinary tract infection) 03/30/2014  . Hearing loss, sensorineural 03/04/2014  . Nasal lesion 03/04/2014  . Unspecified constipation 12/12/2012  . Hypokalemia 12/11/2012  . Orthostatic hypotension 12/11/2012  . Arterial tortuosity (aorta) 12/11/2012  . Protein-calorie malnutrition, severe ([place]) 12/10/2012  . Decreased rectal sphincter tone 11/21/2012  . Idiopathic scoliosis 07/08/2012  . Macular degeneration 06/26/2011  . Vision disturbance 06/26/2011  . Glaucoma 06/26/2011  . Anemia 04/17/2011  . Drug-seeking behavior 02/26/2011  . Osteoporosis 02/26/2011  . Depression   . Anxiety   . COPD (chronic obstructive pulmonary disease) ([place])   . GERD (gastroesophageal reflux disease)   . Migraines   . Insomnia, unspecified     Orientation ACTIVITIES/SOCIAL BLADDER RESPIRATION    Self, Time, Place  Active Continent O2 (As  needed) (2 l)  BEHAVIORAL SYMPTOMS/MOOD NEUROLOGICAL BOWEL NUTRITION STATUS      Continent  (DYS 1 puree diet with think liquids/crushed meds with puree)  PHYSICIAN VISITS COMMUNICATION OF NEEDS Height & Weight Skin    Verbally   118 lbs. Normal          AMBULATORY STATUS RESPIRATION      O2 (As needed) (2 l)      Personal Care Assistance Level of Assistance  Bathing, Feeding, Dressing Bathing Assistance: Limited assistance Feeding assistance: Limited assistance Dressing Assistance: Limited assistance      Functional Limitations Info  Sight             SPECIAL CARE FACTORS FREQUENCY                      Additional Factors Info  Code Status, Allergies, Psychotropic               Current Medications (01/18/2015):  This is the current hospital active medication list Current Facility-Administered Medications  Medication Dose Route Frequency Provider Last Rate Last Dose  . 0.9 %  sodium chloride infusion  250 mL Intravenous PRN Reubin Milan, MD      . acetaminophen (TYLENOL) tablet 650 mg  650 mg Oral Q6H PRN Reubin Milan, MD   650 mg at 01/17/15 0945  . Ampicillin-Sulbactam (UNASYN) 3 g in sodium chloride 0.9 % 100 mL IVPB  3 g Intravenous Q6H Dorrene German, RPH   3 g at 01/18/15 0800  . budesonide (PULMICORT) nebulizer solution 0.25 mg  0.25  mg Nebulization BID PRN Reubin Milan, MD   0.25 mg at 01/17/15 0810  . clorazepate (TRANXENE) tablet 7.5 mg  7.5 mg Oral BID Reubin Milan, MD   7.5 mg at 01/17/15 2129  . divalproex (DEPAKOTE) DR tablet 250 mg  250 mg Oral Daily Reubin Milan, MD   250 mg at 01/17/15 0945  . dorzolamide-timolol (COSOPT) 22.3-6.8 MG/ML ophthalmic solution 1 drop  1 drop Both Eyes BID Reubin Milan, MD   1 drop at 01/17/15 2132  . DULoxetine (CYMBALTA) DR capsule 30 mg  30 mg Oral Daily Reubin Milan, MD   30 mg at 01/17/15 0945  . enoxaparin (LOVENOX) injection 40 mg  40 mg Subcutaneous Q24H Florencia Reasons,  MD   40 mg at 01/17/15 1628  . fluconazole (DIFLUCAN) tablet 100 mg  100 mg Oral Daily Reubin Milan, MD   100 mg at 01/17/15 0945  . fluticasone (FLONASE) 50 MCG/ACT nasal spray 2 spray  2 spray Each Nare Daily PRN Reubin Milan, MD      . HYDROcodone-acetaminophen [facility]) 10-325 MG per tablet 1 tablet  1 tablet Oral 3 times per day Florencia Reasons, MD   1 tablet at 01/18/15 0509  . hydrocortisone (ANUSOL-HC) 2.5 % rectal cream 1 application  1 application Rectal BID PRN Reubin Milan, MD      . ipratropium-albuterol (DUONEB) 0.5-2.5 (3) MG/3ML nebulizer solution 3 mL  3 mL Nebulization TID Reubin Milan, MD   3 mL at 01/18/15 0806  . latanoprost (XALATAN) 0.005 % ophthalmic solution 1 drop  1 drop Both Eyes QHS Reubin Milan, MD   1 drop at 01/17/15 2133  . LORazepam (ATIVAN) tablet 0.5 mg  0.5 mg Oral Daily Florencia Reasons, MD   0.5 mg at 01/17/15 1628  . metoprolol (LOPRESSOR) injection 2.5 mg  2.5 mg Intravenous Q4H PRN Florencia Reasons, MD   2.5 mg at 01/18/15 0518  . mineral oil enema 1 enema  1 enema Rectal Once Florencia Reasons, MD   1 enema at 01/17/15 1231  . mirtazapine (REMERON) tablet 30 mg  30 mg Oral QHS Reubin Milan, MD   30 mg at 01/17/15 2129  . nitroGLYCERIN (NITROSTAT) SL tablet 0.4 mg  0.4 mg Sublingual Q5 min PRN Reubin Milan, MD      . nystatin (MYCOSTATIN) 100000 UNIT/ML suspension 500,000 Units  5 mL Oral TID PRN Florencia Reasons, MD      . ondansetron [facility]) tablet 4 mg  4 mg Oral Q6H PRN Reubin Milan, MD       Or  . ondansetron [facility]) injection 4 mg  4 mg Intravenous Q6H PRN Reubin Milan, MD      . ondansetron [facility]) injection 4 mg  4 mg Intravenous Q6H PRN Reubin Milan, MD      . pantoprazole (PROTONIX) injection 40 mg  40 mg Intravenous Q24H Reubin Milan, MD   40 mg at 01/17/15 2136  . QUEtiapine (SEROQUEL XR) 24 hr tablet 150 mg  150 mg Oral Q2000 Reubin Milan, MD   150 mg at 01/17/15 1951  . sodium chloride 0.9 % injection 3 mL  3 mL  Intravenous Q12H Reubin Milan, MD   3 mL at 01/17/15 2200  . sodium chloride 0.9 % injection 3 mL  3 mL Intravenous Q12H Reubin Milan, MD   3 mL at 01/17/15 0157  . sodium chloride 0.9 % injection 3 mL  3 mL Intravenous PRN Reubin Milan, MD         Discharge Medications: Please see discharge summary for a list of discharge medications.  Relevant Imaging Results:  Relevant Lab Results:  Recent Labs    Additional Information SSN # [id number]  Ludwig Clarks, LCSW

## 2015-01-18 NOTE — Progress Notes (Signed)
PT Cancellation Note  Patient Details Name: Taylor Roth MRN: [id number] DOB: [birth year]   Cancelled Treatment:    Reason Eval/Treat Not Completed: Patient at procedure or test/unavailable   [name],KATHrine E 01/18/2015, 10:25 AM Carmelia Bake, PT, DPT 01/18/2015 Pager: [phone]

## 2015-01-19 ENCOUNTER — Inpatient Hospital Stay (HOSPITAL_COMMUNITY): Payer: Medicare Other

## 2015-01-19 DIAGNOSIS — R509 Fever, unspecified: Secondary | ICD-10-CM

## 2015-01-19 DIAGNOSIS — R112 Nausea with vomiting, unspecified: Secondary | ICD-10-CM

## 2015-01-19 DIAGNOSIS — J690 Pneumonitis due to inhalation of food and vomit: Principal | ICD-10-CM

## 2015-01-19 DIAGNOSIS — K224 Dyskinesia of esophagus: Secondary | ICD-10-CM

## 2015-01-19 DIAGNOSIS — N390 Urinary tract infection, site not specified: Secondary | ICD-10-CM

## 2015-01-19 DIAGNOSIS — G9341 Metabolic encephalopathy: Secondary | ICD-10-CM

## 2015-01-19 DIAGNOSIS — H409 Unspecified glaucoma: Secondary | ICD-10-CM

## 2015-01-19 DIAGNOSIS — J449 Chronic obstructive pulmonary disease, unspecified: Secondary | ICD-10-CM

## 2015-01-19 DIAGNOSIS — E876 Hypokalemia: Secondary | ICD-10-CM

## 2015-01-19 DIAGNOSIS — N183 Chronic kidney disease, stage 3 (moderate): Secondary | ICD-10-CM

## 2015-01-19 LAB — CBC WITH DIFFERENTIAL/PLATELET
Basophils Absolute: 0 10*3/uL (ref 0.0–0.1)
Basophils Relative: 0 %
EOS ABS: 0.3 10*3/uL (ref 0.0–0.7)
Eosinophils Relative: 7 %
HCT: 36.7 % (ref 36.0–46.0)
HEMOGLOBIN: 11.6 g/dL — AB (ref 12.0–15.0)
LYMPHS ABS: 1.5 10*3/uL (ref 0.7–4.0)
LYMPHS PCT: 31 %
MCH: 29 pg (ref 26.0–34.0)
MCHC: 31.6 g/dL (ref 30.0–36.0)
MCV: 91.8 fL (ref 78.0–100.0)
MONOS PCT: 11 %
Monocytes Absolute: 0.5 10*3/uL (ref 0.1–1.0)
NEUTROS PCT: 51 %
Neutro Abs: 2.3 10*3/uL (ref 1.7–7.7)
Platelets: 221 10*3/uL (ref 150–400)
RBC: 4 MIL/uL (ref 3.87–5.11)
RDW: 14.2 % (ref 11.5–15.5)
WBC: 4.7 10*3/uL (ref 4.0–10.5)

## 2015-01-19 LAB — COMPREHENSIVE METABOLIC PANEL
ALK PHOS: 70 U/L (ref 38–126)
ALT: 11 U/L — AB (ref 14–54)
ANION GAP: 6 (ref 5–15)
AST: 18 U/L (ref 15–41)
Albumin: 3 g/dL — ABNORMAL LOW (ref 3.5–5.0)
BILIRUBIN TOTAL: 0.4 mg/dL (ref 0.3–1.2)
BUN: 8 mg/dL (ref 6–20)
CALCIUM: 8.5 mg/dL — AB (ref 8.9–10.3)
CO2: 28 mmol/L (ref 22–32)
CREATININE: 0.58 mg/dL (ref 0.44–1.00)
Chloride: 109 mmol/L (ref 101–111)
Glucose, Bld: 114 mg/dL — ABNORMAL HIGH (ref 65–99)
Potassium: 3.6 mmol/L (ref 3.5–5.1)
SODIUM: 143 mmol/L (ref 135–145)
TOTAL PROTEIN: 6 g/dL — AB (ref 6.5–8.1)

## 2015-01-19 LAB — LEGIONELLA PNEUMOPHILA SEROGP 1 UR AG: L. PNEUMOPHILA SEROGP 1 UR AG: NEGATIVE

## 2015-01-19 LAB — URINE CULTURE

## 2015-01-19 MED ORDER — LORAZEPAM 0.5 MG PO TABS: 0.5000 mg | ORAL_TABLET | Freq: Every day | ORAL | Status: DC

## 2015-01-19 MED ORDER — AMOXICILLIN-POT CLAVULANATE 400-57 MG/5ML PO SUSR: 800.0000 mg | Freq: Two times a day (BID) | ORAL | Status: DC

## 2015-01-19 MED ORDER — ENSURE ENLIVE PO LIQD
237.0000 mL | Freq: Two times a day (BID) | ORAL | Status: DC
  Administered 2015-01-19: 237 mL via ORAL

## 2015-01-19 MED ORDER — HYDROCODONE-ACETAMINOPHEN 10-325 MG PO TABS: 1.0000 | ORAL_TABLET | Freq: Four times a day (QID) | ORAL | Status: DC | PRN

## 2015-01-19 NOTE — NC FL2 (Deleted)
[place] LEVEL OF CARE SCREENING TOOL     IDENTIFICATION  Patient Name: Taylor Roth Birthdate: Aug 24, 1928 Sex: female Admission Date (Current Location): 01/16/2015  [facility] and [place] Number:     Facility and Address:  [facility] [address],  [place] [address], [place]      Provider Number: [id number]  Attending Physician Name and Address:  Verlee Monte, MD  Relative Name and Phone Number:       Current Level of Care: Hospital Recommended Level of Care: [place] Prior Approval Number:    Date Approved/Denied:   PASRR Number:    Discharge Plan: Other (Comment) (ALF)    Current Diagnoses: Patient Active Problem List   Diagnosis Date Noted  . Nausea and vomiting 01/17/2015  . Esophageal dysmotilities   . Fever, unspecified   . Metabolic encephalopathy   . Urinary tract infectious disease   . Aspiration pneumonia ([place]) 01/16/2015  . Weakness 01/14/2015  . Schatzki's ring 01/14/2015  . CKD (chronic kidney disease), stage III 01/14/2015  . Esophagus disorder   . Dysphagia 01/11/2015  . UTI (lower urinary tract infection) 03/30/2014  . Hearing loss, sensorineural 03/04/2014  . Nasal lesion 03/04/2014  . Unspecified constipation 12/12/2012  . Hypokalemia 12/11/2012  . Orthostatic hypotension 12/11/2012  . Arterial tortuosity (aorta) 12/11/2012  . Protein-calorie malnutrition, severe ([place]) 12/10/2012  . Decreased rectal sphincter tone 11/21/2012  . Idiopathic scoliosis 07/08/2012  . Macular degeneration 06/26/2011  . Vision disturbance 06/26/2011  . Glaucoma 06/26/2011  . Anemia 04/17/2011  . Drug-seeking behavior 02/26/2011  . Osteoporosis 02/26/2011  . Depression   . Anxiety   . COPD (chronic obstructive pulmonary disease) ([place])   . GERD (gastroesophageal reflux disease)   . Migraines   . Insomnia, unspecified     Orientation ACTIVITIES/SOCIAL BLADDER RESPIRATION    Self, Time, Place  Active Continent O2  (As needed) (2 l)  BEHAVIORAL SYMPTOMS/MOOD NEUROLOGICAL BOWEL NUTRITION STATUS      Continent  (DYS 1 puree diet with think liquids/crushed meds with puree)  PHYSICIAN VISITS COMMUNICATION OF NEEDS Height & Weight Skin    Verbally   118 lbs. Normal          AMBULATORY STATUS RESPIRATION      O2 (As needed) (2 l)      Personal Care Assistance Level of Assistance  Bathing, Feeding, Dressing Bathing Assistance: Limited assistance Feeding assistance: Limited assistance Dressing Assistance: Limited assistance      Functional Limitations Info  Sight             SPECIAL CARE FACTORS FREQUENCY                      Additional Factors Info  Code Status, Allergies, Psychotropic               Current Medications (01/19/2015):  This is the current hospital active medication list Current Facility-Administered Medications  Medication Dose Route Frequency Provider Last Rate Last Dose  . 0.9 %  sodium chloride infusion  250 mL Intravenous PRN Reubin Milan, MD      . acetaminophen (TYLENOL) tablet 650 mg  650 mg Oral Q6H PRN Reubin Milan, MD   650 mg at 01/17/15 0945  . Ampicillin-Sulbactam (UNASYN) 3 g in sodium chloride 0.9 % 100 mL IVPB  3 g Intravenous Q6H Dorrene German, RPH   3 g at 01/19/15 0901  . budesonide (PULMICORT) nebulizer solution 0.25 mg  0.25  mg Nebulization BID PRN Reubin Milan, MD   0.25 mg at 01/17/15 0810  . clorazepate (TRANXENE) tablet 7.5 mg  7.5 mg Oral BID Reubin Milan, MD   7.5 mg at 01/19/15 1013  . divalproex (DEPAKOTE) DR tablet 250 mg  250 mg Oral Daily Reubin Milan, MD   250 mg at 01/19/15 1015  . dorzolamide-timolol (COSOPT) 22.3-6.8 MG/ML ophthalmic solution 1 drop  1 drop Both Eyes BID Reubin Milan, MD   1 drop at 01/18/15 1000  . DULoxetine (CYMBALTA) DR capsule 30 mg  30 mg Oral Daily Reubin Milan, MD   30 mg at 01/19/15 1013  . enoxaparin (LOVENOX) injection 40 mg  40 mg Subcutaneous Q24H Florencia Reasons, MD   40 mg at 01/18/15 1607  . feeding supplement (ENSURE ENLIVE) (ENSURE ENLIVE) liquid 237 mL  237 mL Oral BID BM Maricela Bo [place], RD   237 mL at 01/19/15 1000  . fluconazole (DIFLUCAN) tablet 100 mg  100 mg Oral Daily Reubin Milan, MD   100 mg at 01/19/15 1013  . fluticasone (FLONASE) 50 MCG/ACT nasal spray 2 spray  2 spray Each Nare Daily PRN Reubin Milan, MD      . HYDROcodone-acetaminophen [facility]) 10-325 MG per tablet 1 tablet  1 tablet Oral 3 times per day Florencia Reasons, MD   1 tablet at 01/19/15 0517  . hydrocortisone (ANUSOL-HC) 2.5 % rectal cream 1 application  1 application Rectal BID PRN Reubin Milan, MD      . ipratropium-albuterol (DUONEB) 0.5-2.5 (3) MG/3ML nebulizer solution 3 mL  3 mL Nebulization TID Reubin Milan, MD   3 mL at 01/18/15 2054  . latanoprost (XALATAN) 0.005 % ophthalmic solution 1 drop  1 drop Both Eyes QHS Reubin Milan, MD   1 drop at 01/17/15 2133  . LORazepam (ATIVAN) tablet 0.5 mg  0.5 mg Oral Daily Florencia Reasons, MD   0.5 mg at 01/19/15 1013  . metoprolol (LOPRESSOR) injection 2.5 mg  2.5 mg Intravenous Q4H PRN Florencia Reasons, MD   2.5 mg at 01/19/15 [id number]  . mineral oil enema 1 enema  1 enema Rectal Once Florencia Reasons, MD   1 enema at 01/17/15 1231  . mirtazapine (REMERON) tablet 30 mg  30 mg Oral QHS Reubin Milan, MD   30 mg at 01/18/15 2112  . nitroGLYCERIN (NITROSTAT) SL tablet 0.4 mg  0.4 mg Sublingual Q5 min PRN Reubin Milan, MD      . nystatin (MYCOSTATIN) 100000 UNIT/ML suspension 500,000 Units  5 mL Oral TID PRN Florencia Reasons, MD      . ondansetron [facility]) tablet 4 mg  4 mg Oral Q6H PRN Reubin Milan, MD       Or  . ondansetron [facility]) injection 4 mg  4 mg Intravenous Q6H PRN Reubin Milan, MD      . ondansetron [facility]) injection 4 mg  4 mg Intravenous Q6H PRN Reubin Milan, MD      . pantoprazole (PROTONIX) injection 40 mg  40 mg Intravenous Q24H Reubin Milan, MD   40 mg at 01/18/15 2334  . QUEtiapine (SEROQUEL  XR) 24 hr tablet 150 mg  150 mg Oral Q2000 Reubin Milan, MD   150 mg at 01/18/15 1951  . sodium chloride 0.9 % injection 3 mL  3 mL Intravenous Q12H Reubin Milan, MD   3 mL at 01/17/15 2200  . sodium chloride 0.9 %  injection 3 mL  3 mL Intravenous Q12H Reubin Milan, MD   3 mL at 01/17/15 0157  . sodium chloride 0.9 % injection 3 mL  3 mL Intravenous PRN Reubin Milan, MD         Discharge Medications:  START taking these medications   Details  amoxicillin-clavulanate (AUGMENTIN) 400-57 MG/5ML suspension Take 10 mLs (800 mg total) by mouth 2 (two) times daily. Qty: 100 mL, Refills: 0      CONTINUE these medications which have CHANGED   Details  HYDROcodone-acetaminophen (NORCO) 10-325 MG tablet Take 1 tablet by mouth every 6 (six) hours as needed for severe pain. Qty: 10 tablet, Refills: 0    LORazepam (ATIVAN) 0.5 MG tablet Take 1 tablet (0.5 mg total) by mouth daily. May also take a full tablet twice daily as needed for anxiety Qty: 10 tablet, Refills: 0      CONTINUE these medications which have NOT CHANGED   Details  acetaminophen (TYLENOL) 325 MG tablet Take 650 mg by mouth every 6 (six) hours as needed for headache.    Acetic Acid (DOUCHE VINEGAR/WATER VA) Place 1 each around the anus once a week. To prevent UTI    aspirin 81 MG chewable tablet Chew 1 tablet (81 mg total) by mouth daily.    Cholecalciferol (VITAMIN D) 1000 UNITS capsule Take 1,000 Units by mouth daily.    clorazepate (TRANXENE) 7.5 MG tablet Take 7.5 mg by mouth 2 (two) times daily.     Cranberry 425 MG CAPS Take 425 mg by mouth 2 (two) times daily.     diclofenac (FLECTOR) 1.3 % PTCH Place 1 patch onto the skin every 12 (twelve) hours as needed (For localized pain.).     divalproex (DEPAKOTE) 250 MG DR tablet Take 250 mg by mouth daily.    docusate sodium (COLACE) 100 MG capsule Take 100 mg by mouth 2 (two) times daily.     dorzolamide-timolol (COSOPT) 22.3-6.8 MG/ML ophthalmic solution Place 1 drop into both eyes 2 (two) times daily.    DULoxetine (CYMBALTA) 30 MG capsule Take 30 mg by mouth daily.    fluconazole (DIFLUCAN) 100 MG tablet Take 100 mg by mouth daily.    fludrocortisone (FLORINEF) 0.1 MG tablet Take 0.1 mg by mouth every morning.    fluticasone (FLOVENT HFA) 110 MCG/ACT inhaler Inhale 1 puff into the lungs 2 (two) times daily as needed (shortness of breath.).    Ginkgo Biloba 120 MG CAPS Take 120 mg by mouth 2 (two) times daily.     hydrocortisone (ANUSOL-HC) 2.5 % rectal cream Place 1 application rectally 2 (two) times daily as needed for hemorrhoids or itching.    meclizine (ANTIVERT) 25 MG tablet Take 25 mg by mouth 3 (three) times daily as needed for dizziness. Do not combine with Hydroxyzine or any other antihistamine.    Melatonin 1 MG TABS Take 1 mg by mouth at bedtime.     mineral oil-hydrophilic petrolatum (AQUAPHOR) ointment Apply 1 application topically daily.    mirtazapine (REMERON) 45 MG tablet Take 45 mg by mouth at bedtime.    nitroGLYCERIN (NITROSTAT) 0.4 MG SL tablet Place 0.4 mg under the tongue every 5 (five) minutes as needed for chest pain. May use 3 times    nystatin (MYCOSTATIN) 100000 UNIT/ML suspension Take 5 mLs by mouth 3 (three) times daily as needed (mouth pain).     omeprazole (PRILOSEC) 20 MG capsule Take 20 mg by mouth daily.    ondansetron (ZOFRAN) 4  MG tablet Take 1 tablet (4 mg total) by mouth every 6 (six) hours. Qty: 12 tablet, Refills: 0    polyethylene glycol (MIRALAX / GLYCOLAX) packet Take 17 g by mouth daily as needed for mild constipation or moderate constipation.     PRESCRIPTION MEDICATION Take 1 Container by mouth 2 (two) times daily between meals. Med Pass    QUEtiapine Fumarate (SEROQUEL XR) 150 MG 24 hr tablet Take 150 mg by mouth daily at 8 pm.    Sennosides (SENNA LAX PO) Take  2 tablets by mouth 2 (two) times daily. Take 2 tablets in the morning and take 2 tablets at bedtime.    travoprost, benzalkonium, (TRAVATAN) 0.004 % ophthalmic solution Place 1 drop into both eyes at bedtime.    triamcinolone (NASACORT ALLERGY 24HR) 55 MCG/ACT AERO nasal inhaler Place 2 sprays into the nose daily as needed (allergies).    triamcinolone cream (KENALOG) 0.1 % Apply 1 application topically 3 (three) times daily as needed (worsening symtoms then resume as needed).    triazolam (HALCION) 0.25 MG tablet Take 0.25 mg by mouth at bedtime.    pantoprazole (PROTONIX) 40 MG tablet Take 1 tablet (40 mg total) by mouth daily at 6 (six) AM. Qty: 30 tablet, Refills: 2          Please see discharge summary for a list of discharge medications.  Relevant Imaging Results:  Relevant Lab Results:  Recent Labs    Additional Information SSN # [id number]  Ludwig Clarks, LCSW

## 2015-01-19 NOTE — Discharge Summary (Signed)
Physician Discharge Summary  [name] [id number] DOB: 1928-02-24 DOA: 01/16/2015  PCP: Pcp Not In System  Admit date: 01/16/2015 Discharge date: 01/19/2015  Time spent: 40 minutes  Recommendations for Outpatient Follow-up:  1. Follow up with the nursing home M.D. 2. Pure diet, keep seated (upright) for at least 60 minutes after meals, no food 3-4 hours before bedtime. 3. Augmentin suspension 800 mg by mouth twice a day for 5 more days.   Discharge Diagnoses:  Principal Problem:   Aspiration pneumonia ([place]) Active Problems:   COPD (chronic obstructive pulmonary disease) (HCC)   Glaucoma   Hypokalemia   Schatzki's ring   CKD (chronic kidney disease), stage III   Nausea and vomiting   Esophageal dysmotilities   Fever, unspecified   Metabolic encephalopathy   Urinary tract infectious disease   Discharge Condition:  Stable  Diet recommendation:  Pure diet, please read above instruction.  Filed Weights   01/17/15 0135  Weight: 53.751 kg (118 lb 8 oz)    History of present illness:  Taylor Roth is a 81 y.o. female with below past medical history, well known to me from an earlier admission this week who returns to the hospital from [facility] facility due to nausea and emesis yesterday and today 2 days after being discharged for dysphagia secondary to a nonobstructive Schatzki's ring. [facility] reports that they have and norovirus outbreak. However, the patient reports that she has not have a significant change in her symptoms since she was discharged on Friday. She denies fever, chills, dyspnea, sore throat, productive cough, chest pain, palpitations, diaphoresis, pitting edema of the lower extremities, abdominal pain, diarrhea or urinary symptoms.   She also reports a fall yesterday at the facility. She just states that she got up without assistance and has been able to ambulate since then, but complains of left hip pain. Hip x-rays in the emergency  department were negative for fracture and positive for DJD changes.  Patient was also noticed to be hypoxic at 88% in the emergency department, which improved with nasal cannula oxygen. Chest radiograph shows left lung infiltrate.   Hospital Course:   Fever 100.9 on admission, from aspiration pna? UTI? no leukocytosis, lactic acid wnl. Blood culture no growth, no cough, mrsa screen negative urine culture perlim: + enterococcus,  Was on Unasyn in the hospital discharged on Augmentin for 5 more days.  Aspiration pneumonia ([place]) Acute hypoxic respiratory failure with  saturation off88% on room air per note. This is suspected to be secondary to aspiration pneumonia. Legionella urine antigen pending and negative strep pneumoniae urine antigens. Swallow eval recommended puree diet due to no denture, continue aspiration precaution. Ordered sitting up in chair for an hour with each meal. GI consulted, barium swallow done on 12/6, showed possible presbyesophagus. Recommended pure diet and sitting after meals  UTI;  per family patient was recently treated for uti a few weeks ago with keflex, patient usually has symptom with odor and dysuria.  Patient herself not able to provide reliable history Urine culture showed amoxicillin susceptible enterococcus. Patient discharged on Augmentin 800 mg twice a day (suspension) for 5 more days.  Metabolic encephalopathy:  on admission patient was oriented to person only and very lethargic, per daughter patient is aaox3 when not sick, this is resolved.  Schatzki's ring  Nausea and vomiting Continue proton pump inhibitor and antiemetics.  Recent egd on 12/1, biopsy negative H pylori. Reported persistent n/v, barium swallow on 12/6 per GI recommended pure diet  and sitting upright after meals.  COPD (chronic obstructive pulmonary disease) (HCC) Continue supplemental oxygen and bronchodilators. No wheezing, no respiratory  distress  Glaucoma Continue current topical drops.  Hypokalemia Repleted with oral supplements.  CKD (chronic kidney disease), stage III ? Creatinine 0.5 on discharge.  History of depression, anxiety Continue duloxetine, mirtazapine, Seroquel and anxiolytics as needed.  FTT: may need home health vs SNF at discharge, pending Pt eval.  H/o severe malnutrition and underweight,  per daughter patient has since gained weight, prealbumin 9.1, nutrition consulted obtained.  HTN;  patient was previously started on florinef when she was underweight and volume depleted in 03/2014 per chart review her orthostatic vital sign wnl this admission and am cortisol level wnl, will d/c florinef. Monitor bp and orthostatic vital signs.  Neck pain Patient severe neck pain secondary to the fall, (not mentioned on admission that she hit her head). Cervical plain x-rays obtained and showed severe degenerative disease without acute changes.   Procedures:  None  Consultations:  None  Discharge Exam: Filed Vitals:   01/19/15 0455 01/19/15 0612  BP: 183/83 183/88  Pulse: 86   Temp: 97.9 F (36.6 C)   Resp: 20    General: Alert and awake, oriented x3, not in any acute distress. HEENT: anicteric sclera, pupils reactive to light and accommodation, EOMI CVS: S1-S2 clear, no murmur rubs or gallops Chest: clear to auscultation bilaterally, no wheezing, rales or rhonchi Abdomen: soft nontender, nondistended, normal bowel sounds, no organomegaly Extremities: no cyanosis, clubbing or edema noted bilaterally Neuro: Cranial nerves II-XII intact, no focal neurological deficits  Discharge Instructions   Discharge Instructions    Diet - low sodium heart healthy    Complete by:  As directed      Increase activity slowly    Complete by:  As directed           Current Discharge Medication List    START taking these medications   Details  amoxicillin-clavulanate (AUGMENTIN) 400-57 MG/5ML  suspension Take 10 mLs (800 mg total) by mouth 2 (two) times daily. Qty: 100 mL, Refills: 0      CONTINUE these medications which have CHANGED   Details  HYDROcodone-acetaminophen (NORCO) 10-325 MG tablet Take 1 tablet by mouth every 6 (six) hours as needed for severe pain. Qty: 10 tablet, Refills: 0    LORazepam (ATIVAN) 0.5 MG tablet Take 1 tablet (0.5 mg total) by mouth daily. May also take a full tablet twice daily as needed for anxiety Qty: 10 tablet, Refills: 0      CONTINUE these medications which have NOT CHANGED   Details  acetaminophen (TYLENOL) 325 MG tablet Take 650 mg by mouth every 6 (six) hours as needed for headache.    Acetic Acid (DOUCHE VINEGAR/WATER VA) Place 1 each around the anus once a week. To prevent UTI    aspirin 81 MG chewable tablet Chew 1 tablet (81 mg total) by mouth daily.    Cholecalciferol (VITAMIN D) 1000 UNITS capsule Take 1,000 Units by mouth daily.    clorazepate (TRANXENE) 7.5 MG tablet Take 7.5 mg by mouth 2 (two) times daily.     Cranberry 425 MG CAPS Take 425 mg by mouth 2 (two) times daily.     diclofenac (FLECTOR) 1.3 % PTCH Place 1 patch onto the skin every 12 (twelve) hours as needed (For localized pain.).     divalproex (DEPAKOTE) 250 MG DR tablet Take 250 mg by mouth daily.    docusate sodium (COLACE) 100  MG capsule Take 100 mg by mouth 2 (two) times daily.    dorzolamide-timolol (COSOPT) 22.3-6.8 MG/ML ophthalmic solution Place 1 drop into both eyes 2 (two) times daily.    DULoxetine (CYMBALTA) 30 MG capsule Take 30 mg by mouth daily.    fluconazole (DIFLUCAN) 100 MG tablet Take 100 mg by mouth daily.    fludrocortisone (FLORINEF) 0.1 MG tablet Take 0.1 mg by mouth every morning.    fluticasone (FLOVENT HFA) 110 MCG/ACT inhaler Inhale 1 puff into the lungs 2 (two) times daily as needed (shortness of breath.).    Ginkgo Biloba 120 MG CAPS Take 120 mg by mouth 2 (two) times daily.     hydrocortisone (ANUSOL-HC) 2.5 % rectal  cream Place 1 application rectally 2 (two) times daily as needed for hemorrhoids or itching.    meclizine (ANTIVERT) 25 MG tablet Take 25 mg by mouth 3 (three) times daily as needed for dizziness. Do not combine with Hydroxyzine or any other antihistamine.    Melatonin 1 MG TABS Take 1 mg by mouth at bedtime.     mineral oil-hydrophilic petrolatum (AQUAPHOR) ointment Apply 1 application topically daily.    mirtazapine (REMERON) 45 MG tablet Take 45 mg by mouth at bedtime.    nitroGLYCERIN (NITROSTAT) 0.4 MG SL tablet Place 0.4 mg under the tongue every 5 (five) minutes as needed for chest pain. May use 3 times    nystatin (MYCOSTATIN) 100000 UNIT/ML suspension Take 5 mLs by mouth 3 (three) times daily as needed (mouth pain).     omeprazole (PRILOSEC) 20 MG capsule Take 20 mg by mouth daily.    ondansetron (ZOFRAN) 4 MG tablet Take 1 tablet (4 mg total) by mouth every 6 (six) hours. Qty: 12 tablet, Refills: 0    polyethylene glycol (MIRALAX / GLYCOLAX) packet Take 17 g by mouth daily as needed for mild constipation or moderate constipation.     PRESCRIPTION MEDICATION Take 1 Container by mouth 2 (two) times daily between meals. Med Pass    QUEtiapine Fumarate (SEROQUEL XR) 150 MG 24 hr tablet Take 150 mg by mouth daily at 8 pm.    Sennosides (SENNA LAX PO) Take 2 tablets by mouth 2 (two) times daily. Take 2 tablets in the morning and take 2 tablets at bedtime.    travoprost, benzalkonium, (TRAVATAN) 0.004 % ophthalmic solution Place 1 drop into both eyes at bedtime.    triamcinolone (NASACORT ALLERGY 24HR) 55 MCG/ACT AERO nasal inhaler Place 2 sprays into the nose daily as needed (allergies).    triamcinolone cream (KENALOG) 0.1 % Apply 1 application topically 3 (three) times daily as needed (worsening symtoms then resume as needed).    triazolam (HALCION) 0.25 MG tablet Take 0.25 mg by mouth at bedtime.    pantoprazole (PROTONIX) 40 MG tablet Take 1 tablet (40 mg total) by mouth  daily at 6 (six) AM. Qty: 30 tablet, Refills: 2       Allergies  Allergen Reactions  . Macrobid [Nitrofurantoin Macrocrystal] Rash  . Azithromycin Other (See Comments)    unknown  . Doxycycline Other (See Comments)    unknown  . Escitalopram Oxalate Other (See Comments)    unknown  . Fioricet [Butalbital-Apap-Caffeine] Other (See Comments)    Drunk.  . Latex Other (See Comments)    unknown  . Levofloxacin Other (See Comments)    unknown  . Nsaids Other (See Comments)    Allergic reaction not listed on MAR.  Marland Kitchen Oxycodone Other (See Comments)  reaction to synthetic codeine  . Restasis [Cyclosporine] Other (See Comments)    unknown  . Sulfa Antibiotics Other (See Comments)    Reaction unknown  . Tylenol [Acetaminophen] Other (See Comments)    jaundice  . Biaxin [Clarithromycin] Rash    With burning sensation      The results of significant diagnostics from this hospitalization (including imaging, microbiology, ancillary and laboratory) are listed below for reference.    Significant Diagnostic Studies: Dg Chest 2 View  01/16/2015  CLINICAL DATA:  Shortness of breath.  COPD. EXAM: CHEST  2 VIEW COMPARISON:  01/12/2015. FINDINGS: Interval small left pleural effusion and mild increase in patchy density at the left lung base. There is also interval increased density at the posterior right lung base. The lungs remain mildly hyperexpanded with mild diffuse peribronchial thickening and accentuation of the interstitial markings. The heart remains normal in size. Mild scoliosis is unchanged. IMPRESSION: 1. Interval small left pleural effusion. 2. Increased left basilar pneumonia or atelectasis. 3. Interval probable atelectasis at the posterior right lung base. Electronically Signed   By: Claudie Revering M.D.   On: 01/16/2015 20:50   Dg Chest 2 View  01/12/2015  CLINICAL DATA:  History of pneumonia.  Esophageal narrowing. EXAM: CHEST  2 VIEW COMPARISON:  Barium swallow 01/11/2015.  Chest  x-ray 03/30/2014. FINDINGS: Mediastinum and hilar structures normal. Heart size normal. Left base atelectasis and/or mild infiltrate. Stable elevation left hemidiaphragm. Fuse osteopenia degenerative change. IMPRESSION: 1. Left base subsegmental atelectasis and or mild infiltrate. 2. Stable elevation left hemidiaphragm . Electronically Signed   By: Marcello Moores  Register   On: 01/12/2015 08:24   Dg Cervical Spine 2 Or 3 Views  01/19/2015  CLINICAL DATA:  81 year old female with right-sided lower neck pain for the past 2-3 days. EXAM: CERVICAL SPINE - 2-3 VIEW COMPARISON:  CT of the cervical spine 09/09/2014. FINDINGS: Three views of the cervical spine again demonstrate solid fusion between C3 and C4, as well as C5-C7. There continues to be 4 mm of anterolisthesis of C4 upon C5. Additionally, there is dextroscoliosis of the thoracic spine convex to the right at the level of C5 which appears to be chronic. Severe multilevel degenerative disc disease and facet arthropathy redemonstrated. No definite acute displaced fractures on today's plain film examination. IMPRESSION: 1. Advanced chronic degenerative changes and chronic postoperative changes redemonstrated in the cervical spine, as above. No acute findings identified. Electronically Signed   By: Vinnie Langton M.D.   On: 01/19/2015 08:52   Ct Abdomen Pelvis W Contrast  01/11/2015  CLINICAL DATA:  Nausea and vomiting.  Low abdominal cramping EXAM: CT ABDOMEN AND PELVIS WITH CONTRAST TECHNIQUE: Multidetector CT imaging of the abdomen and pelvis was performed using the standard protocol following bolus administration of intravenous contrast. CONTRAST:  128mL OMNIPAQUE IOHEXOL 300 MG/ML  SOLN COMPARISON:  12/09/2012 FINDINGS: Lower chest and abdominal wall: The distal esophagus is distended with semi-solid material. Best visualized on coronal reformats there is contained appearing outpouching from the left aspect of the distal esophagus extending posterior. No  surrounding fat edema or inflammatory enhancement. Small left pleural effusion and lower lobe atelectasis. Hepatobiliary: No focal liver abnormality.Chronic intrahepatic and extrahepatic bile duct enlargement post cholecystectomy. No evidence of calcified choledocholithiasis. Noted liver function tests. Pancreas: Unremarkable. Spleen: Unremarkable. Adrenals/Urinary Tract: Negative adrenals. No hydronephrosis or stone. Unremarkable bladder. Reproductive:Hysterectomy. Stable 2 cm right adnexal cyst, considered benign given long interval and size. Stomach/Bowel: Prominent enhancement at the gastric cardia without focal masslike appearance. There  could be stricture given distal esophagus appearance. Large volume colonic stool without small bowel or colonic obstruction. No appendicitis. Vascular/Lymphatic: No acute vascular abnormality. No mass or adenopathy. Peritoneal: No ascites or pneumoperitoneum. Musculoskeletal: Lumbar levoscoliosis with multilevel advanced disc and facet degeneration. These results were called by telephone at the time of interpretation on 01/11/2015 at 5:09 pm to Dr. Brantley Stage , who verbally acknowledged these results. IMPRESSION: 1. Distended lower esophagus which could be from gastroesophageal reflux/vomiting or distal esophageal stricture. Gas around the lower esophagus favors epiphrenic diverticulum over extraluminal gas, but was not seen in 2014. If esophageal rupture is a clinical possibility, esophagram could be obtained. 2. No definitive explanation for lower abdominal pain. 3. Small left pleural effusion. 4. Large stool volume. Electronically Signed   By: Monte Fantasia M.D.   On: 01/11/2015 17:12   Dg Esophagus  01/18/2015  CLINICAL DATA:  81 year old female with history of food impaction post endoscopies showing prominent kink of the distal esophagus. Subsequent encounter. EXAM: ESOPHOGRAM/BARIUM SWALLOW TECHNIQUE: Single contrast examination was performed using  thin barium.  FLUOROSCOPY TIME:  Radiation Exposure Index (as provided by the fluoroscopic device): 11.9 micro Gray COMPARISON: 01/11/2015. FINDINGS: Examination had to be tailored to patient's ability to cooperate with ingestion of contrast. Poor primary esophageal stripping wave with tertiary wave contractions noted. No complete obstructing lesion identified. Prominent kink of the mid and distal thoracic esophagus. When the patient ingested 13 mm barium tablet (as per request), this becomes lodged in the mid to distal esophagus and would not clear with ingestion of warm water (performed over 10 minutes). Dr. Oletta Lamas notified of results. IMPRESSION: Poor primary esophageal stripping wave with tertiary wave contractions noted. No complete obstructing lesion identified. Prominent kink of the mid and distal thoracic esophagus. 13 mm barium tablet becomes lodged in the mid to distal esophagus. Electronically Signed   By: Genia Del M.D.   On: 01/18/2015 12:29   Dg Hip Unilat With Pelvis 2-3 Views Left  01/16/2015  CLINICAL DATA:  [place] tonight.  Left hip pain. EXAM: DG HIP (WITH OR WITHOUT PELVIS) 2-3V LEFT COMPARISON:  CT scan 01/11/2015. FINDINGS: Both hips are normally located. Mild degenerative changes bilaterally. No acute hip fracture is identified. No plain film findings for avascular necrosis. The pubic symphysis and SI joints are intact. No pelvic fractures are identified. IMPRESSION: Mild bilateral hip joint degenerative changes but no acute hip or pelvic fracture. Electronically Signed   By: Marijo Sanes M.D.   On: 01/16/2015 20:47   Dg Esophagus W/water Sol Cm  01/11/2015  CLINICAL DATA:  81 year old female with nausea. Question obstruction versus esophageal leak. EXAM: ESOPHOGRAM/BARIUM SWALLOW TECHNIQUE: Single contrast examination was performed using 10 cc water-soluble. FLUOROSCOPY TIME:  If the device does not provide the exposure index: Fluoroscopy Time:  2 minutes and 31 seconds. Number of  Acquired Images: 21 images with combination of last image hold and spot films. COMPARISON:  CT earlier in the day. FINDINGS: The patient was only able to ingest 10 cc of contrast. This revealed prominent retained contents within the esophagus with smooth narrowing distal esophagus above what may be a small hiatal hernia. Contrast did not traverse beyond this region with change of patient position and delayed imaging. This suggest obstructing lesion. No leak identified although contrast did not traverse beyond the hiatus. Results were discussed with Dr. Dolly Rias. Patient is to be kept with head elevated 25 degrees for the next few hours. GI consultation may be considered. IMPRESSION:  Prominent retained contents within the esophagus with smooth narrowing distal esophagus above what may be a small hiatal hernia. Contrast did not traverse beyond this region suggesting obstructing lesion. No leak identified although contrast did not traverse beyond the hiatus. Results were discussed with Dr. Dolly Rias. Patient is to be kept with head elevated 25 degrees for the next few hours. GI consultation may be considered. Electronically Signed   By: Genia Del M.D.   On: 01/11/2015 18:28    Microbiology: Recent Results (from the past 240 hour(s))  Culture, Urine     Status: None   Collection Time: 01/17/15 12:32 AM  Result Value Ref Range Status   Specimen Description URINE, CLEAN CATCH  Final   Special Requests NONE  Final   Culture   Final    >=100,000 COLONIES/mL ENTEROCOCCUS SPECIES Performed at [facility]    Report Status 01/19/2015 FINAL  Final   Organism ID, Bacteria ENTEROCOCCUS SPECIES  Final      Susceptibility   Enterococcus species - MIC*    AMPICILLIN <=2 SENSITIVE Sensitive     LEVOFLOXACIN 1 SENSITIVE Sensitive     NITROFURANTOIN <=16 SENSITIVE Sensitive     VANCOMYCIN 1 SENSITIVE Sensitive     * >=100,000 COLONIES/mL ENTEROCOCCUS SPECIES  Culture, blood (routine x 2) Call MD if unable  to obtain prior to antibiotics being given     Status: None (Preliminary result)   Collection Time: 01/17/15  2:10 AM  Result Value Ref Range Status   Specimen Description BLOOD RIGHT ARM  Final   Special Requests BOTTLES DRAWN AEROBIC ONLY 10CC  Final   Culture   Final    NO GROWTH 1 DAY Performed at [facility]    Report Status PENDING  Incomplete  Culture, blood (routine x 2) Call MD if unable to obtain prior to antibiotics being given     Status: None (Preliminary result)   Collection Time: 01/17/15  2:23 AM  Result Value Ref Range Status   Specimen Description BLOOD BLOOD RIGHT HAND  Final   Special Requests IN PEDIATRIC BOTTLE 2ML  Final   Culture   Final    NO GROWTH 1 DAY Performed at [facility]    Report Status PENDING  Incomplete  MRSA PCR Screening     Status: None   Collection Time: 01/17/15  3:04 AM  Result Value Ref Range Status   MRSA by PCR NEGATIVE NEGATIVE Final    Comment:        The GeneXpert MRSA Assay (FDA approved for NASAL specimens only), is one component of a comprehensive MRSA colonization surveillance program. It is not intended to diagnose MRSA infection nor to guide or monitor treatment for MRSA infections.      Labs: Basic Metabolic Panel:  Recent Labs Lab 01/14/15 0530 01/16/15 2059 01/17/15 0223 01/18/15 0431 01/19/15 0432  NA 140 143 145 145 143  K 3.2* 3.0* 3.0* 4.0 3.6  CL 108 108 109 111 109  CO2 26 27 27 27 28   GLUCOSE 106* 134* 104* 93 114*  BUN 13 17 16 10 8   CREATININE 0.75 0.66 0.63 0.71 0.58  CALCIUM 8.5* 8.5* 8.2* 8.5* 8.5*  MG  --  1.8  --  1.7  --   PHOS  --  3.3  --   --   --    Liver Function Tests:  Recent Labs Lab 01/16/15 2059 01/17/15 0223 01/18/15 0431 01/19/15 0432  AST 23 21 18 18   ALT  16 14 13* 11*  ALKPHOS 84 77 73 70  BILITOT 0.5 0.6 0.5 0.4  PROT 6.4* 6.2* 5.7* 6.0*  ALBUMIN 3.2* 3.2* 2.9* 3.0*    Recent Labs Lab 01/16/15 2059  LIPASE 23   No results for input(s):  AMMONIA in the last 168 hours. CBC:  Recent Labs Lab 01/14/15 0530 01/16/15 2059 01/17/15 0223 01/18/15 0431 01/19/15 0432  WBC 6.3 7.9 6.9 4.9 4.7  NEUTROABS  --  6.0 4.8 2.5 2.3  HGB 11.4* 12.2 11.7* 11.8* 11.6*  HCT 34.8* 38.6 36.7 37.5 36.7  MCV 92.3 93.0 93.1 93.1 91.8  PLT 180 206 207 204 221   Cardiac Enzymes: No results for input(s): CKTOTAL, CKMB, CKMBINDEX, TROPONINI in the last 168 hours. BNP: BNP (last 3 results) No results for input(s): BNP in the last 8760 hours.  ProBNP (last 3 results) No results for input(s): PROBNP in the last 8760 hours.  CBG: No results for input(s): GLUCAP in the last 168 hours.     Signed:  Ivan Lacher A  [name] 01/19/2015, 12:38 PM

## 2015-01-19 NOTE — NC FL2 (Deleted)
[place] LEVEL OF CARE SCREENING TOOL     IDENTIFICATION  Patient Name: Taylor Roth Birthdate: [birth year] Sex: female Admission Date (Current Location): 01/16/2015  [facility] and [place] Number:     Facility and Address:  [facility],  [place] [address] [place]      Provider Number: [id number]  Attending Physician Name and Address:  Verlee Monte, MD  Relative Name and Phone Number:       Current Level of Care: Hospital Recommended Level of Care: [place] Prior Approval Number:    Date Approved/Denied:   PASRR Number:    Discharge Plan: Other (Comment) (ALF)    Current Diagnoses: Patient Active Problem List   Diagnosis Date Noted  . Nausea and vomiting 01/17/2015  . Esophageal dysmotilities   . Fever, unspecified   . Metabolic encephalopathy   . Urinary tract infectious disease   . Aspiration pneumonia ([place]) 01/16/2015  . Weakness 01/14/2015  . Schatzki's ring 01/14/2015  . CKD (chronic kidney disease), stage III 01/14/2015  . Esophagus disorder   . Dysphagia 01/11/2015  . UTI (lower urinary tract infection) 03/30/2014  . Hearing loss, sensorineural 03/04/2014  . Nasal lesion 03/04/2014  . Unspecified constipation 12/12/2012  . Hypokalemia 12/11/2012  . Orthostatic hypotension 12/11/2012  . Arterial tortuosity (aorta) 12/11/2012  . Protein-calorie malnutrition, severe ([place]) 12/10/2012  . Decreased rectal sphincter tone 11/21/2012  . Idiopathic scoliosis 07/08/2012  . Macular degeneration 06/26/2011  . Vision disturbance 06/26/2011  . Glaucoma 06/26/2011  . Anemia 04/17/2011  . Drug-seeking behavior 02/26/2011  . Osteoporosis 02/26/2011  . Depression   . Anxiety   . COPD (chronic obstructive pulmonary disease) ([place])   . GERD (gastroesophageal reflux disease)   . Migraines   . Insomnia, unspecified     Orientation ACTIVITIES/SOCIAL BLADDER RESPIRATION    Self, Time, Place  Active Continent O2  (As needed) (2 l)  BEHAVIORAL SYMPTOMS/MOOD NEUROLOGICAL BOWEL NUTRITION STATUS      Continent  (DYS 1 puree diet with think liquids/crushed meds with puree)  PHYSICIAN VISITS COMMUNICATION OF NEEDS Height & Weight Skin    Verbally   118 lbs. Normal          AMBULATORY STATUS RESPIRATION      O2 (As needed) (2 l)      Personal Care Assistance Level of Assistance  Bathing, Feeding, Dressing Bathing Assistance: Limited assistance Feeding assistance: Limited assistance Dressing Assistance: Limited assistance      Functional Limitations Info  Sight             SPECIAL CARE FACTORS FREQUENCY                      Additional Factors Info  Code Status, Allergies, Psychotropic               Current Medications (01/19/2015):  This is the current hospital active medication list Current Facility-Administered Medications  Medication Dose Route Frequency Provider Last Rate Last Dose  . 0.9 %  sodium chloride infusion  250 mL Intravenous PRN Reubin Milan, MD      . acetaminophen (TYLENOL) tablet 650 mg  650 mg Oral Q6H PRN Reubin Milan, MD   650 mg at 01/17/15 0945  . Ampicillin-Sulbactam (UNASYN) 3 g in sodium chloride 0.9 % 100 mL IVPB  3 g Intravenous Q6H Dorrene German, RPH   3 g at 01/19/15 0901  . budesonide (PULMICORT) nebulizer solution 0.25 mg  0.25  mg Nebulization BID PRN Reubin Milan, MD   0.25 mg at 01/17/15 0810  . clorazepate (TRANXENE) tablet 7.5 mg  7.5 mg Oral BID Reubin Milan, MD   7.5 mg at 01/19/15 1013  . divalproex (DEPAKOTE) DR tablet 250 mg  250 mg Oral Daily Reubin Milan, MD   250 mg at 01/19/15 1015  . dorzolamide-timolol (COSOPT) 22.3-6.8 MG/ML ophthalmic solution 1 drop  1 drop Both Eyes BID Reubin Milan, MD   1 drop at 01/18/15 1000  . DULoxetine (CYMBALTA) DR capsule 30 mg  30 mg Oral Daily Reubin Milan, MD   30 mg at 01/19/15 1013  . enoxaparin (LOVENOX) injection 40 mg  40 mg Subcutaneous Q24H Florencia Reasons, MD   40 mg at 01/18/15 1607  . feeding supplement (ENSURE ENLIVE) (ENSURE ENLIVE) liquid 237 mL  237 mL Oral BID BM Maricela Bo [place], RD   237 mL at 01/19/15 1000  . fluconazole (DIFLUCAN) tablet 100 mg  100 mg Oral Daily Reubin Milan, MD   100 mg at 01/19/15 1013  . fluticasone (FLONASE) 50 MCG/ACT nasal spray 2 spray  2 spray Each Nare Daily PRN Reubin Milan, MD      . HYDROcodone-acetaminophen [facility]) 10-325 MG per tablet 1 tablet  1 tablet Oral 3 times per day Florencia Reasons, MD   1 tablet at 01/19/15 0517  . hydrocortisone (ANUSOL-HC) 2.5 % rectal cream 1 application  1 application Rectal BID PRN Reubin Milan, MD      . ipratropium-albuterol (DUONEB) 0.5-2.5 (3) MG/3ML nebulizer solution 3 mL  3 mL Nebulization TID Reubin Milan, MD   3 mL at 01/18/15 2054  . latanoprost (XALATAN) 0.005 % ophthalmic solution 1 drop  1 drop Both Eyes QHS Reubin Milan, MD   1 drop at 01/17/15 2133  . LORazepam (ATIVAN) tablet 0.5 mg  0.5 mg Oral Daily Florencia Reasons, MD   0.5 mg at 01/19/15 1013  . metoprolol (LOPRESSOR) injection 2.5 mg  2.5 mg Intravenous Q4H PRN Florencia Reasons, MD   2.5 mg at 01/19/15 [id number]  . mineral oil enema 1 enema  1 enema Rectal Once Florencia Reasons, MD   1 enema at 01/17/15 1231  . mirtazapine (REMERON) tablet 30 mg  30 mg Oral QHS Reubin Milan, MD   30 mg at 01/18/15 2112  . nitroGLYCERIN (NITROSTAT) SL tablet 0.4 mg  0.4 mg Sublingual Q5 min PRN Reubin Milan, MD      . nystatin (MYCOSTATIN) 100000 UNIT/ML suspension 500,000 Units  5 mL Oral TID PRN Florencia Reasons, MD      . ondansetron [facility]) tablet 4 mg  4 mg Oral Q6H PRN Reubin Milan, MD       Or  . ondansetron [facility]) injection 4 mg  4 mg Intravenous Q6H PRN Reubin Milan, MD      . ondansetron [facility]) injection 4 mg  4 mg Intravenous Q6H PRN Reubin Milan, MD      . pantoprazole (PROTONIX) injection 40 mg  40 mg Intravenous Q24H Reubin Milan, MD   40 mg at 01/18/15 2334  . QUEtiapine (SEROQUEL  XR) 24 hr tablet 150 mg  150 mg Oral Q2000 Reubin Milan, MD   150 mg at 01/18/15 1951  . sodium chloride 0.9 % injection 3 mL  3 mL Intravenous Q12H Reubin Milan, MD   3 mL at 01/17/15 2200  . sodium chloride 0.9 %  injection 3 mL  3 mL Intravenous Q12H Reubin Milan, MD   3 mL at 01/17/15 0157  . sodium chloride 0.9 % injection 3 mL  3 mL Intravenous PRN Reubin Milan, MD         Discharge Medications: Please see discharge summary for a list of discharge medications.  Relevant Imaging Results:  Relevant Lab Results:  Recent Labs    Additional Information SSN # [id number]  Ludwig Clarks, LCSW

## 2015-01-19 NOTE — Progress Notes (Signed)
Initial Nutrition Assessment  DOCUMENTATION CODES:   Not applicable  INTERVENTION:  - Will order Ensure Enlive BID, each supplement provides 350 kcal and 20 grams of protein - Will monitor for SLP and MD notes related to swallowing abilities and additional needs related to this - RD will continue to monitor for needs  NUTRITION DIAGNOSIS:   Swallowing difficulty related to other (see comment) (EGD findings) as evidenced by other (see comment) (need for Dysphagia 1 diet order.).  GOAL:   Patient will meet greater than or equal to 90% of their needs  MONITOR:   PO intake, Supplement acceptance, Weight trends, Labs, Skin, I & O's  REASON FOR ASSESSMENT:   Consult Assessment of nutrition requirement/status  ASSESSMENT:   81 y.o. female with below past medical history, well known to me from an earlier admission this week who returns to the hospital from [facility] facility due to nausea and emesis yesterday and today 2 days after being discharged for dysphagia secondary to a nonobstructive Schatzki's ring. [facility] reports that they have and norovirus outbreak. However, the patient reports that she has not have a significant change in her symptoms since she was discharged on Friday.   Pt seen for consult. BMI indicates normal weight. Chart review indicates that pt consumed 50% of dinner 12/5 and 75% of lunch yesterday (12/6). Pt's breakfast tray untouched at time of RD visit and pt states she does not feel hungry and does not want to eat this AM. Pt seemed lethargic during RD visit and it was difficult to obtain relevant information from her.   Dentures not in during RD visit and notes indicate that dentures intermittently present during hospitalization; Dysphagia 1 diet has been recommended and ordered to resolve issues this may present.   GI note 12/5 @ 1005 states:  She was seen by our service just last week. At that time she presented with the same symptoms and a  question of an obstructed esophagus. A water-soluble contrast study was performed in the emergency room. The patient was only able to swallow a small amount of the contrast but this did show food material in the esophagus as well as a lot of liquid and a dilated esophagus. Dr. Penelope Coop performed EGD just 3 days ago and this showed up significantly tortuous esophagus with dilation but no clear stricture and appeared from the pictures to have a fairly wide open GE junction but marked angulation of the esophagus at that point. Biopsies were obtained and were negative for H. pylori. According to the discharge summary, the patient was tolerating soft moist foods upon discharge. She was readmitted with probable aspiration pneumonia based on chest x-ray and was started on antibiotics.  Plan for EGD was made at that time. SLP saw pt 12/5 and recommended pureed diet texture.   GI note 12/6 @ 0752 states:  Per EGD very tortuous esophagus with prominent "kink" above GEJ but no significant stricture. I will get a formal barium swallow with pill while she is doing well and would keep her on a pureed diet since she doesn't always have her teeth for some reason. GI note this AM @ [id number] states: Barium Swallow results noted marked tortous esophagus with poor motility.  Pt refused physical assessment to be done this AM to assess for muscle and fat wasting. Per chart review, pt gained 9 lbs from 03/30/14-01/11/15 and then gained an additional 4 lbs from 01/11/15-01/17/15; will continue to monitor weight trends.  Unable to obtain information from  pt this AM. Will continue to monitor POC, PO intakes, and nutrition-related needs. Medications reviewed. Labs reviewed; Ca: 8.5 mg/dL.   Diet Order:  DIET - DYS 1 Room service appropriate?: Yes; Fluid consistency:: Thin  Skin:  Reviewed, no issues  Last BM:  12/5  Height:   Ht Readings from Last 1 Encounters:  01/17/15 5' (1.524 m)    Weight:   Wt Readings from Last 1  Encounters:  01/17/15 118 lb 8 oz (53.751 kg)    Ideal Body Weight:  45.45 kg (kg)  BMI:  Body mass index is 23.14 kg/(m^2).  Estimated Nutritional Needs:   Kcal:  1180-1400  Protein:  45-52 grams protein  Fluid:  1.8-2.1 L/day  EDUCATION NEEDS:   No education needs identified at this time     Jarome Matin, RD, LDN Inpatient Clinical Dietitian Pager # [phone] After hours/weekend pager # [phone]

## 2015-01-19 NOTE — Progress Notes (Signed)
[facility] PROGRESS NOTE Subjective BS results noted marked tortous esophagus with poor motility   Objective: Vital signs in last 24 hours: Temp:  [97.9 F (36.6 C)-98.3 F (36.8 C)] 97.9 F (36.6 C) (12/07 0455) Pulse Rate:  [71-86] 86 (12/07 0455) Resp:  [20-22] 20 (12/07 0455) BP: (160-183)/(68-91) 183/88 mmHg (12/07 0612) SpO2:  [93 %-100 %] 96 % (12/07 0455) Last BM Date: 01/17/15  Intake/Output from previous day: 12/06 0701 - 12/07 0700 In: 2315 [P.O.:240; I.V.:1675; IV Piggyback:400] Out: 1350 [Urine:1350] Intake/Output this shift:    PE: General--alert  Abdomen--nontender   Lab Results:  Recent Labs  01/16/15 2059 01/17/15 0223 01/18/15 0431 01/19/15 0432  WBC 7.9 6.9 4.9 4.7  HGB 12.2 11.7* 11.8* 11.6*  HCT 38.6 36.7 37.5 36.7  PLT 206 207 204 221   BMET  Recent Labs  01/16/15 2059 01/17/15 0223 01/18/15 0431 01/19/15 0432  NA 143 145 145 143  K 3.0* 3.0* 4.0 3.6  CL 108 109 111 109  CO2 27 27 27 28   CREATININE 0.66 0.63 0.71 0.58   LFT  Recent Labs  01/17/15 0223 01/18/15 0431 01/19/15 0432  PROT 6.2* 5.7* 6.0*  AST 21 18 18   ALT 14 13* 11*  ALKPHOS 77 73 70  BILITOT 0.6 0.5 0.4   PT/INR No results for input(s): LABPROT, INR in the last 72 hours. PANCREAS  Recent Labs  01/16/15 2059  LIPASE 23         Studies/Results: Dg Esophagus  01/18/2015  CLINICAL DATA:  81 year old female with history of food impaction post endoscopies showing prominent kink of the distal esophagus. Subsequent encounter. EXAM: ESOPHOGRAM/BARIUM SWALLOW TECHNIQUE: Single contrast examination was performed using  thin barium. FLUOROSCOPY TIME:  Radiation Exposure Index (as provided by the fluoroscopic device): 11.9 micro Gray COMPARISON: 01/11/2015. FINDINGS: Examination had to be tailored to patient's ability to cooperate with ingestion of contrast. Poor primary esophageal stripping wave with tertiary wave contractions noted. No complete  obstructing lesion identified. Prominent kink of the mid and distal thoracic esophagus. When the patient ingested 13 mm barium tablet (as per request), this becomes lodged in the mid to distal esophagus and would not clear with ingestion of warm water (performed over 10 minutes). Dr. Oletta Lamas notified of results. IMPRESSION: Poor primary esophageal stripping wave with tertiary wave contractions noted. No complete obstructing lesion identified. Prominent kink of the mid and distal thoracic esophagus. 13 mm barium tablet becomes lodged in the mid to distal esophagus. Electronically Signed   By: Genia Del M.D.   On: 01/18/2015 12:29    Medications: I have reviewed the patient's current medications.  Assessment/Plan: 1. Dysphagia. Due to poor motility and tortuous esophagus. Nothing to dilate. I think she will need to remain on pureed food with lots of liquids and sit up while eating or drinking.  Will sign off please call for further problems.  Seleni Meller JR,Delonte Musich L 01/19/2015, 7:43 AM  Pager: [phone] If no answer or after hours call [phone]

## 2015-01-19 NOTE — Progress Notes (Signed)
Pt c/o neck pain and had fall 3 days ago at assisted living facility, right side of head bruised, NP Baltazar Najjar paged, new orders given. Will continue to monitor pt.

## 2015-01-19 NOTE — Clinical Social Work Note (Signed)
Clinical Social Work Assessment  Patient Details  Name: Taylor Roth MRN: [id number] Date of Birth: [birth year]  Date of referral:  01/19/15               Reason for consult:  Facility Placement                Permission sought to share information with:  Family Supports Permission granted to share information::  Yes, Verbal Permission Granted  Name::      (daughter- )  Agency::     Relationship::     Contact Information:     Housing/Transportation Living arrangements for the past 2 months:  [place] of Information:  Patient, Facility, Adult Children Patient Interpreter Needed:  None Criminal Activity/Legal Involvement Pertinent to Current Situation/Hospitalization:  No - Comment as needed Significant Relationships:  Adult Children, Community Support Lives with:  Facility Resident Do you feel safe going back to the place where you live?  Yes Need for family participation in patient care:  Yes (Comment)  Care giving concerns:  For return to ALF today.   Social Worker assessment / plan:  CSW spoke with Daughter- [name] and Opal Sidles at [facility] who agree with plans for return to [facility] ALF with [facility] PT [facility] as pta).   Employment status:  Retired Forensic scientist:  Medicare PT Recommendations:  Home with [place] / Referral to community resources:     Patient/Family's Response to care:  Daughter agrees to plans for return to ALF today- she will transport.   Patient/Family's Understanding of and Emotional Response to Diagnosis, Current Treatment, and Prognosis:  Family and AF agree with plans and understand diagnosis and treatment plans.   Emotional Assessment Appearance:  Developmentally appropriate Attitude/Demeanor/Rapport:    Affect (typically observed):  Appropriate Orientation:    Alcohol / Substance use:  Never Used Psych involvement (Current and /or in the community):  No (Comment)  Discharge Needs  Concerns to be addressed:     Readmission within the last 30 days:    Current discharge risk:  None Barriers to Discharge:      Ludwig Clarks, LCSW 01/19/2015, 2:29 PM

## 2015-01-19 NOTE — Progress Notes (Signed)
Patient's daughter, [name], here to transport pt to facility, given discharge paperwork

## 2015-01-19 NOTE — Care Management Important Message (Signed)
Important Message  Patient Details IM Letter given to Cookie/Case Manager to present to [place] Message  Patient Details  Name: KRYSTALEE SHIBUYA MRN: [id number] Date of Birth: February 02, 1929   Medicare Important Message Given:  Yes    Camillo Flaming 01/19/2015, 12:48 PM Name: CLAIRESSA MADURA MRN: [id number] Date of Birth: 1928/02/28   Medicare Important Message Given:  Yes    Camillo Flaming 01/19/2015, 12:47 PM

## 2015-01-19 NOTE — Progress Notes (Signed)
Patient discharged to facility, Removed telemetry and IV, spoke with Marcie Bal, LSW, and pt's daughter is to pick up pt and transport to ALF at 4pm

## 2015-01-22 LAB — CULTURE, BLOOD (ROUTINE X 2)
CULTURE: NO GROWTH
Culture: NO GROWTH

## 2015-01-26 ENCOUNTER — Emergency Department (HOSPITAL_COMMUNITY): Payer: Medicare Other

## 2015-01-26 ENCOUNTER — Encounter (HOSPITAL_COMMUNITY): Payer: Self-pay | Admitting: Emergency Medicine

## 2015-01-26 ENCOUNTER — Inpatient Hospital Stay (HOSPITAL_COMMUNITY)
Admission: EM | Admit: 2015-01-26 | Discharge: 2015-01-31 | DRG: 152 | Disposition: A | Discharge status: Skilled Nursing Facility | Payer: Medicare Other | Attending: Internal Medicine | Admitting: Internal Medicine

## 2015-01-26 DIAGNOSIS — M419 Scoliosis, unspecified: Secondary | ICD-10-CM | POA: Diagnosis not present

## 2015-01-26 DIAGNOSIS — M25461 Effusion, right knee: Secondary | ICD-10-CM | POA: Diagnosis present

## 2015-01-26 DIAGNOSIS — J069 Acute upper respiratory infection, unspecified: Secondary | ICD-10-CM | POA: Diagnosis not present

## 2015-01-26 DIAGNOSIS — G4700 Insomnia, unspecified: Secondary | ICD-10-CM | POA: Diagnosis not present

## 2015-01-26 DIAGNOSIS — E876 Hypokalemia: Secondary | ICD-10-CM | POA: Diagnosis present

## 2015-01-26 DIAGNOSIS — M25551 Pain in right hip: Secondary | ICD-10-CM

## 2015-01-26 DIAGNOSIS — R509 Fever, unspecified: Secondary | ICD-10-CM | POA: Diagnosis present

## 2015-01-26 DIAGNOSIS — G92 Toxic encephalopathy: Secondary | ICD-10-CM | POA: Diagnosis present

## 2015-01-26 DIAGNOSIS — E440 Moderate protein-calorie malnutrition: Secondary | ICD-10-CM | POA: Insufficient documentation

## 2015-01-26 DIAGNOSIS — G9340 Encephalopathy, unspecified: Secondary | ICD-10-CM

## 2015-01-26 DIAGNOSIS — F319 Bipolar disorder, unspecified: Secondary | ICD-10-CM | POA: Diagnosis not present

## 2015-01-26 DIAGNOSIS — M1711 Unilateral primary osteoarthritis, right knee: Secondary | ICD-10-CM | POA: Diagnosis present

## 2015-01-26 DIAGNOSIS — F419 Anxiety disorder, unspecified: Secondary | ICD-10-CM | POA: Diagnosis not present

## 2015-01-26 DIAGNOSIS — M810 Age-related osteoporosis without current pathological fracture: Secondary | ICD-10-CM | POA: Diagnosis present

## 2015-01-26 DIAGNOSIS — N183 Chronic kidney disease, stage 3 unspecified: Secondary | ICD-10-CM | POA: Diagnosis present

## 2015-01-26 DIAGNOSIS — Z885 Allergy status to narcotic agent status: Secondary | ICD-10-CM

## 2015-01-26 DIAGNOSIS — Z7952 Long term (current) use of systemic steroids: Secondary | ICD-10-CM

## 2015-01-26 DIAGNOSIS — K219 Gastro-esophageal reflux disease without esophagitis: Secondary | ICD-10-CM | POA: Diagnosis not present

## 2015-01-26 DIAGNOSIS — Z7982 Long term (current) use of aspirin: Secondary | ICD-10-CM

## 2015-01-26 DIAGNOSIS — R4182 Altered mental status, unspecified: Secondary | ICD-10-CM | POA: Diagnosis present

## 2015-01-26 DIAGNOSIS — R5381 Other malaise: Secondary | ICD-10-CM | POA: Diagnosis present

## 2015-01-26 DIAGNOSIS — Z79899 Other long term (current) drug therapy: Secondary | ICD-10-CM

## 2015-01-26 DIAGNOSIS — R296 Repeated falls: Secondary | ICD-10-CM | POA: Diagnosis present

## 2015-01-26 DIAGNOSIS — Z882 Allergy status to sulfonamides status: Secondary | ICD-10-CM

## 2015-01-26 DIAGNOSIS — Z881 Allergy status to other antibiotic agents status: Secondary | ICD-10-CM

## 2015-01-26 DIAGNOSIS — X58XXXD Exposure to other specified factors, subsequent encounter: Secondary | ICD-10-CM | POA: Diagnosis not present

## 2015-01-26 DIAGNOSIS — T50995A Adverse effect of other drugs, medicaments and biological substances, initial encounter: Secondary | ICD-10-CM | POA: Diagnosis present

## 2015-01-26 DIAGNOSIS — M160 Bilateral primary osteoarthritis of hip: Secondary | ICD-10-CM | POA: Diagnosis present

## 2015-01-26 DIAGNOSIS — Z91040 Latex allergy status: Secondary | ICD-10-CM

## 2015-01-26 DIAGNOSIS — J449 Chronic obstructive pulmonary disease, unspecified: Secondary | ICD-10-CM | POA: Diagnosis not present

## 2015-01-26 DIAGNOSIS — Z87891 Personal history of nicotine dependence: Secondary | ICD-10-CM

## 2015-01-26 DIAGNOSIS — S96901D Unspecified injury of unspecified muscle and tendon at ankle and foot level, right foot, subsequent encounter: Secondary | ICD-10-CM | POA: Diagnosis not present

## 2015-01-26 DIAGNOSIS — Z886 Allergy status to analgesic agent status: Secondary | ICD-10-CM

## 2015-01-26 DIAGNOSIS — M25561 Pain in right knee: Secondary | ICD-10-CM

## 2015-01-26 DIAGNOSIS — Z7951 Long term (current) use of inhaled steroids: Secondary | ICD-10-CM

## 2015-01-26 DIAGNOSIS — M25579 Pain in unspecified ankle and joints of unspecified foot: Secondary | ICD-10-CM | POA: Insufficient documentation

## 2015-01-26 DIAGNOSIS — Z888 Allergy status to other drugs, medicaments and biological substances status: Secondary | ICD-10-CM

## 2015-01-26 DIAGNOSIS — R531 Weakness: Secondary | ICD-10-CM

## 2015-01-26 LAB — I-STAT TROPONIN, ED: TROPONIN I, POC: 0 ng/mL (ref 0.00–0.08)

## 2015-01-26 LAB — URINALYSIS, ROUTINE W REFLEX MICROSCOPIC
BILIRUBIN URINE: NEGATIVE
Glucose, UA: NEGATIVE mg/dL
Hgb urine dipstick: NEGATIVE
KETONES UR: NEGATIVE mg/dL
NITRITE: NEGATIVE
PH: 6.5 (ref 5.0–8.0)
Protein, ur: NEGATIVE mg/dL
Specific Gravity, Urine: 1.025 (ref 1.005–1.030)

## 2015-01-26 LAB — CBC WITH DIFFERENTIAL/PLATELET
BASOS ABS: 0 10*3/uL (ref 0.0–0.1)
BASOS PCT: 0 %
EOS ABS: 0.1 10*3/uL (ref 0.0–0.7)
EOS PCT: 2 %
HEMATOCRIT: 39.4 % (ref 36.0–46.0)
HEMOGLOBIN: 12.2 g/dL (ref 12.0–15.0)
Lymphocytes Relative: 19 %
Lymphs Abs: 1.6 10*3/uL (ref 0.7–4.0)
MCH: 28.7 pg (ref 26.0–34.0)
MCHC: 31 g/dL (ref 30.0–36.0)
MCV: 92.7 fL (ref 78.0–100.0)
MONO ABS: 0.7 10*3/uL (ref 0.1–1.0)
Monocytes Relative: 9 %
Neutro Abs: 5.8 10*3/uL (ref 1.7–7.7)
Neutrophils Relative %: 70 %
Platelets: 385 10*3/uL (ref 150–400)
RBC: 4.25 MIL/uL (ref 3.87–5.11)
RDW: 14.2 % (ref 11.5–15.5)
WBC: 8.2 10*3/uL (ref 4.0–10.5)

## 2015-01-26 LAB — URINE MICROSCOPIC-ADD ON

## 2015-01-26 LAB — COMPREHENSIVE METABOLIC PANEL
ALBUMIN: 3.2 g/dL — AB (ref 3.5–5.0)
ALK PHOS: 67 U/L (ref 38–126)
ALT: 14 U/L (ref 14–54)
AST: 22 U/L (ref 15–41)
Anion gap: 10 (ref 5–15)
BILIRUBIN TOTAL: 0.5 mg/dL (ref 0.3–1.2)
BUN: 21 mg/dL — AB (ref 6–20)
CALCIUM: 9.3 mg/dL (ref 8.9–10.3)
CO2: 28 mmol/L (ref 22–32)
Chloride: 105 mmol/L (ref 101–111)
Creatinine, Ser: 0.67 mg/dL (ref 0.44–1.00)
GFR calc Af Amer: >60 mL/min (ref >60)
GLUCOSE: 103 mg/dL — AB (ref 65–99)
Potassium: 2.7 mmol/L — CL (ref 3.5–5.1)
Sodium: 143 mmol/L (ref 135–145)
TOTAL PROTEIN: 7.3 g/dL (ref 6.5–8.1)

## 2015-01-26 LAB — LIPASE, BLOOD: LIPASE: 21 U/L (ref 11–51)

## 2015-01-26 LAB — I-STAT CG4 LACTIC ACID, ED: Lactic Acid, Venous: 1.18 mmol/L (ref 0.5–2.0)

## 2015-01-26 LAB — CK: Total CK: 62 U/L (ref 38–234)

## 2015-01-26 MED ORDER — HYDROCODONE-ACETAMINOPHEN 5-325 MG PO TABS
1.0000 | ORAL_TABLET | Freq: Four times a day (QID) | ORAL | Status: DC | PRN
  Administered 2015-01-27 – 2015-01-30 (×4): 1 via ORAL
  Administered 2015-01-30: 2 via ORAL
  Administered 2015-01-31: 1 via ORAL
  Administered 2015-01-31: 2 via ORAL
  Filled 2015-01-26 (×3): qty 1
  Filled 2015-01-26: qty 2
  Filled 2015-01-26: qty 1
  Filled 2015-01-26: qty 2
  Filled 2015-01-26 (×2): qty 1

## 2015-01-26 MED ORDER — PANTOPRAZOLE SODIUM 40 MG PO TBEC
40.0000 mg | DELAYED_RELEASE_TABLET | Freq: Every day | ORAL | Status: DC
  Administered 2015-01-27 – 2015-01-31 (×5): 40 mg via ORAL
  Filled 2015-01-26 (×5): qty 1

## 2015-01-26 MED ORDER — BUDESONIDE 0.25 MG/2ML IN SUSP: 0.2500 mg | Freq: Two times a day (BID) | RESPIRATORY_TRACT | Status: DC | PRN

## 2015-01-26 MED ORDER — DORZOLAMIDE HCL-TIMOLOL MAL 2-0.5 % OP SOLN
1.0000 [drp] | Freq: Two times a day (BID) | OPHTHALMIC | Status: DC
  Administered 2015-01-26 – 2015-01-31 (×10): 1 [drp] via OPHTHALMIC
  Filled 2015-01-26: qty 10

## 2015-01-26 MED ORDER — ASPIRIN 81 MG PO CHEW
81.0000 mg | CHEWABLE_TABLET | Freq: Every day | ORAL | Status: DC
  Administered 2015-01-27 – 2015-01-31 (×5): 81 mg via ORAL
  Filled 2015-01-26 (×5): qty 1

## 2015-01-26 MED ORDER — TRIAMCINOLONE ACETONIDE 55 MCG/ACT NA AERO: 2.0000 | INHALATION_SPRAY | Freq: Every day | NASAL | Status: DC | PRN

## 2015-01-26 MED ORDER — FLUCONAZOLE 100 MG PO TABS
100.0000 mg | ORAL_TABLET | Freq: Every day | ORAL | Status: DC
  Administered 2015-01-26 – 2015-01-31 (×6): 100 mg via ORAL
  Filled 2015-01-26 (×6): qty 1

## 2015-01-26 MED ORDER — POLYETHYLENE GLYCOL 3350 17 G PO PACK: 17.0000 g | PACK | Freq: Every day | ORAL | Status: DC | PRN

## 2015-01-26 MED ORDER — ONDANSETRON HCL 4 MG PO TABS: 4.0000 mg | ORAL_TABLET | Freq: Four times a day (QID) | ORAL | Status: DC | PRN

## 2015-01-26 MED ORDER — DICLOFENAC EPOLAMINE 1.3 % TD PTCH: 1.0000 | MEDICATED_PATCH | Freq: Two times a day (BID) | TRANSDERMAL | Status: DC | PRN

## 2015-01-26 MED ORDER — ONDANSETRON HCL 4 MG/2ML IJ SOLN: 4.0000 mg | Freq: Four times a day (QID) | INTRAMUSCULAR | Status: DC | PRN

## 2015-01-26 MED ORDER — TRAVOPROST (BAK FREE) 0.004 % OP SOLN
1.0000 [drp] | Freq: Every day | OPHTHALMIC | Status: DC
  Filled 2015-01-26: qty 2.5

## 2015-01-26 MED ORDER — DULOXETINE HCL 30 MG PO CPEP
30.0000 mg | ORAL_CAPSULE | Freq: Every day | ORAL | Status: DC
  Administered 2015-01-27 – 2015-01-29 (×3): 30 mg via ORAL
  Filled 2015-01-26 (×3): qty 1

## 2015-01-26 MED ORDER — FLUDROCORTISONE ACETATE 0.1 MG PO TABS
0.1000 mg | ORAL_TABLET | Freq: Every morning | ORAL | Status: DC
  Administered 2015-01-27 – 2015-01-31 (×5): 0.1 mg via ORAL
  Filled 2015-01-26 (×5): qty 1

## 2015-01-26 MED ORDER — SODIUM CHLORIDE 0.9 % IV BOLUS (SEPSIS)
1000.0000 mL | Freq: Once | INTRAVENOUS | Status: AC
  Administered 2015-01-26: 1000 mL via INTRAVENOUS

## 2015-01-26 MED ORDER — PIPERACILLIN-TAZOBACTAM 3.375 G IVPB
3.3750 g | Freq: Once | INTRAVENOUS | Status: AC
  Administered 2015-01-26: 3.375 g via INTRAVENOUS
  Filled 2015-01-26: qty 50

## 2015-01-26 MED ORDER — POTASSIUM CHLORIDE CRYS ER 20 MEQ PO TBCR
40.0000 meq | EXTENDED_RELEASE_TABLET | Freq: Once | ORAL | Status: AC
  Administered 2015-01-28: 40 meq via ORAL
  Filled 2015-01-26: qty 2

## 2015-01-26 MED ORDER — TRIAZOLAM 0.125 MG PO TABS
0.2500 mg | ORAL_TABLET | Freq: Every day | ORAL | Status: DC
  Administered 2015-01-26: 0.25 mg via ORAL
  Filled 2015-01-26: qty 2

## 2015-01-26 MED ORDER — QUETIAPINE FUMARATE ER 50 MG PO TB24
150.0000 mg | ORAL_TABLET | Freq: Every day | ORAL | Status: DC
  Administered 2015-01-26 – 2015-01-30 (×5): 150 mg via ORAL
  Filled 2015-01-26 (×7): qty 3

## 2015-01-26 MED ORDER — HYDRALAZINE HCL 20 MG/ML IJ SOLN
10.0000 mg | Freq: Once | INTRAMUSCULAR | Status: AC
  Administered 2015-01-26: 10 mg via INTRAVENOUS
  Filled 2015-01-26: qty 1

## 2015-01-26 MED ORDER — MELATONIN 1 MG PO TABS: 1.0000 mg | ORAL_TABLET | Freq: Every day | ORAL | Status: DC

## 2015-01-26 MED ORDER — HYDROMORPHONE HCL 1 MG/ML IJ SOLN
1.0000 mg | Freq: Once | INTRAMUSCULAR | Status: AC
  Administered 2015-01-26: 1 mg via INTRAVENOUS
  Filled 2015-01-26: qty 1

## 2015-01-26 MED ORDER — ENOXAPARIN SODIUM 40 MG/0.4ML ~~LOC~~ SOLN
40.0000 mg | SUBCUTANEOUS | Status: DC
  Administered 2015-01-26 – 2015-01-30 (×5): 40 mg via SUBCUTANEOUS
  Filled 2015-01-26 (×5): qty 0.4

## 2015-01-26 MED ORDER — DIVALPROEX SODIUM 250 MG PO DR TAB
250.0000 mg | DELAYED_RELEASE_TABLET | Freq: Every day | ORAL | Status: DC
  Administered 2015-01-27 – 2015-01-31 (×5): 250 mg via ORAL
  Filled 2015-01-26 (×5): qty 1

## 2015-01-26 MED ORDER — FLUTICASONE PROPIONATE HFA 110 MCG/ACT IN AERO: 1.0000 | INHALATION_SPRAY | Freq: Two times a day (BID) | RESPIRATORY_TRACT | Status: DC | PRN

## 2015-01-26 MED ORDER — DOCUSATE SODIUM 100 MG PO CAPS
100.0000 mg | ORAL_CAPSULE | Freq: Two times a day (BID) | ORAL | Status: DC
Start: 2015-01-26 — End: 2015-01-29
  Administered 2015-01-27 – 2015-01-29 (×4): 100 mg via ORAL
  Filled 2015-01-26 (×6): qty 1

## 2015-01-26 MED ORDER — ACETAMINOPHEN 325 MG PO TABS
650.0000 mg | ORAL_TABLET | Freq: Four times a day (QID) | ORAL | Status: DC | PRN
  Administered 2015-01-28 – 2015-01-29 (×2): 650 mg via ORAL
  Filled 2015-01-26 (×2): qty 2

## 2015-01-26 MED ORDER — ALBUTEROL SULFATE (2.5 MG/3ML) 0.083% IN NEBU: 2.5000 mg | INHALATION_SOLUTION | Freq: Two times a day (BID) | RESPIRATORY_TRACT | Status: DC | PRN

## 2015-01-26 MED ORDER — SODIUM CHLORIDE 0.9 % IV SOLN
INTRAVENOUS | Status: DC
  Administered 2015-01-26 – 2015-01-28 (×4): via INTRAVENOUS

## 2015-01-26 MED ORDER — POTASSIUM CHLORIDE 10 MEQ/100ML IV SOLN
10.0000 meq | INTRAVENOUS | Status: AC
  Administered 2015-01-26 (×4): 10 meq via INTRAVENOUS
  Filled 2015-01-26 (×2): qty 100

## 2015-01-26 MED ORDER — CLORAZEPATE DIPOTASSIUM 7.5 MG PO TABS
7.5000 mg | ORAL_TABLET | Freq: Two times a day (BID) | ORAL | Status: DC
  Administered 2015-01-26 – 2015-01-29 (×6): 7.5 mg via ORAL
  Filled 2015-01-26: qty 1
  Filled 2015-01-26 (×3): qty 2
  Filled 2015-01-26 (×3): qty 1

## 2015-01-26 MED ORDER — NITROGLYCERIN 0.4 MG SL SUBL: 0.4000 mg | SUBLINGUAL_TABLET | SUBLINGUAL | Status: DC | PRN

## 2015-01-26 MED ORDER — MIRTAZAPINE 15 MG PO TABS
45.0000 mg | ORAL_TABLET | Freq: Every day | ORAL | Status: DC
  Administered 2015-01-26 – 2015-01-30 (×5): 45 mg via ORAL
  Filled 2015-01-26 (×5): qty 3

## 2015-01-26 MED ORDER — LATANOPROST 0.005 % OP SOLN
1.0000 [drp] | Freq: Every day | OPHTHALMIC | Status: DC
Start: 2015-01-26 — End: 2015-01-31
  Administered 2015-01-26 – 2015-01-30 (×5): 1 [drp] via OPHTHALMIC
  Filled 2015-01-26: qty 2.5

## 2015-01-26 MED ORDER — HYDROCORTISONE 2.5 % RE CREA: 1.0000 "application " | TOPICAL_CREAM | Freq: Two times a day (BID) | RECTAL | Status: DC | PRN

## 2015-01-26 MED ORDER — FAMOTIDINE 20 MG PO TABS
20.0000 mg | ORAL_TABLET | Freq: Every day | ORAL | Status: DC
  Administered 2015-01-27 – 2015-01-31 (×5): 20 mg via ORAL
  Filled 2015-01-26 (×5): qty 1

## 2015-01-26 NOTE — ED Notes (Signed)
Contacted provider to confirm verbal phone order and contraindications

## 2015-01-26 NOTE — ED Notes (Signed)
She is quite alert and oriented to all except day/date.

## 2015-01-26 NOTE — ED Notes (Signed)
Put message in amion - to provider reporting pain and blood pressure.

## 2015-01-26 NOTE — H&P (Signed)
[name] History and Physical  Taylor Roth [id number] DOB: 11-23-1928 DOA: 01/26/2015  Referring physician: EDP PCP: Pcp Not In System   Chief Complaint:fever/weakness  HPI: Taylor Roth is a 81 y.o. female  with a past medical history of osteoporosis, depression, anxiety, COPD, GERD, migraines headaches, chronic kidney disease, chronic mental illness who was brought to the [facility] emergency department from her assisted living facility due to fevers, some confusion, and poor by mouth intake. Patient is a very poor and unreliable historian her daughter at bedside reports that she was discharged from here on Wednesday after hospitalization for suspected aspiration pneumonia and UTI, she went back to her assisted living facility and was supposed to start home health PT which did not start until Friday.. On Friday she walked a little bit with her walker subsequently over the weekend started complaining of aches and pains all over and feeling feverish and started having low-grade fevers. On Sunday she started having some hallucinations actually called the police on her daughter and the director at her assisted living. Her daughter subsequently stopped her antibiotic Sunday night. Due to persistence of low-grade fevers, and diffuse body aches and weakness she was brought to the emergency room today. In the ER her temperature was 100.4 she had diffuse muscle tenderness labs were remarkable for hypokalemia.    Review of Systems:  Constitutional:  No weight loss, night sweats, Fevers, chills, fatigue.  HEENT:  No headaches, Difficulty swallowing,Tooth/dental problems,Sore throat,  No sneezing, itching, ear ache, nasal congestion, post nasal drip,  Cardio-vascular:  No chest pain, Orthopnea, PND, swelling in lower extremities, anasarca, dizziness, palpitations  GI:  No heartburn, indigestion, abdominal pain, nausea, vomiting, diarrhea, change in bowel habits,  loss of appetite  Resp:  No shortness of breath with exertion or at rest. No excess mucus, no productive cough, No non-productive cough, No coughing up of blood.No change in color of mucus.No wheezing.No chest wall deformity  Skin:  no rash or lesions.  GU:  no dysuria, change in color of urine, no urgency or frequency. No flank pain.  Musculoskeletal:  No joint pain or swelling. No decreased range of motion. No back pain.  Psych:  No change in mood or affect. No depression or anxiety. No memory loss.   Past Medical History  Diagnosis Date  . Osteoporosis   . Depression   . Anxiety   . COPD (chronic obstructive pulmonary disease) ([place])   . GERD (gastroesophageal reflux disease)   . Abuse     benzos/narcotics  . Migraines   . Scoliosis   . Insomnia, unspecified   . Vitamin B deficiency   . Weight loss   . Chronic mental illness   . Arterial tortuosity (aorta) 12/11/2012  . Chronic kidney disease, stage IV (severe) ([place]) 12/11/2012  . Substance abuse     Benzos and Narcotics, she does not get any of those medicines from [facility], we have empahtically told her that.   . Vertigo   . Colitis   . Schatzki's ring 01/14/2015  . Dysphagia 01/11/2015  . Nasal bone fracture 03/30/2014   Past Surgical History  Procedure Laterality Date  . Cholecystectomy    . Partial hysterectomy    . Abdominal hysterectomy    . Cataract extraction    . Esophagogastroduodenoscopy (egd) with propofol N/A 01/13/2015    Procedure: ESOPHAGOGASTRODUODENOSCOPY (EGD) WITH PROPOFOL;  Surgeon: [name], MD;  Location: [facility] ENDOSCOPY;  Service: Endoscopy;  Laterality: N/A;   Social History:  reports that she has quit smoking. Her smoking use included Cigarettes. She smoked 0.50 packs per day. She has never used smokeless tobacco. She reports that she does not drink alcohol or use illicit drugs.  Allergies  Allergen Reactions  . Macrobid [Nitrofurantoin Macrocrystal] Rash  . Azithromycin Other (See Comments)      unknown  . Doxycycline Other (See Comments)    unknown  . Escitalopram Oxalate Other (See Comments)    unknown  . Fioricet [Butalbital-Apap-Caffeine] Other (See Comments)    Drunk.  . Latex Other (See Comments)    unknown  . Levofloxacin Other (See Comments)    unknown  . Nsaids Other (See Comments)    Allergic reaction not listed on MAR.  Marland Kitchen Oxycodone Other (See Comments)    reaction to synthetic codeine  . Restasis [Cyclosporine] Other (See Comments)    unknown  . Sulfa Antibiotics Other (See Comments)    Reaction unknown  . Tylenol [Acetaminophen] Other (See Comments)    jaundice  . Biaxin [Clarithromycin] Rash    With burning sensation    Family History  Problem Relation Age of Onset  . Stroke Mother   . Diabetes Mother   . Cancer Father 68    esophageal  . Heart disease Mother   . Hearing loss Mother     Prior to Admission medications   Medication Sig Start Date End Date Taking? Authorizing Provider  acetaminophen (TYLENOL) 325 MG tablet Take 650 mg by mouth every 6 (six) hours as needed for headache.   Yes Historical Provider, MD  albuterol (PROVENTIL HFA;VENTOLIN HFA) 108 (90 BASE) MCG/ACT inhaler Inhale 1 puff into the lungs 2 (two) times daily as needed for wheezing or shortness of breath.   Yes Historical Provider, MD  aspirin 81 MG chewable tablet Chew 1 tablet (81 mg total) by mouth daily. 12/12/12  Yes [name], MD  Cholecalciferol (VITAMIN D) 1000 UNITS capsule Take 1,000 Units by mouth daily.   Yes Historical Provider, MD  clorazepate (TRANXENE) 7.5 MG tablet Take 7.5 mg by mouth 2 (two) times daily.  12/23/12  Yes Estill Dooms, MD  Cranberry 425 MG CAPS Take 425 mg by mouth 2 (two) times daily.    Yes Historical Provider, MD  diclofenac (FLECTOR) 1.3 % PTCH Place 1 patch onto the skin every 12 (twelve) hours as needed (For localized pain.).    Yes Historical Provider, MD  divalproex (DEPAKOTE) 250 MG DR tablet Take 250 mg by mouth daily.   Yes  Historical Provider, MD  docusate sodium (COLACE) 100 MG capsule Take 100 mg by mouth 2 (two) times daily.   Yes Historical Provider, MD  dorzolamide-timolol (COSOPT) 22.3-6.8 MG/ML ophthalmic solution Place 1 drop into both eyes 2 (two) times daily.   Yes Historical Provider, MD  DULoxetine (CYMBALTA) 30 MG capsule Take 30 mg by mouth daily.   Yes Historical Provider, MD  fluconazole (DIFLUCAN) 100 MG tablet Take 100 mg by mouth daily.   Yes Historical Provider, MD  fludrocortisone (FLORINEF) 0.1 MG tablet Take 0.1 mg by mouth every morning.   Yes Historical Provider, MD  Ginkgo Biloba 120 MG CAPS Take 120 mg by mouth 2 (two) times daily.    Yes Historical Provider, MD  HYDROcodone-acetaminophen (NORCO) 10-325 MG tablet Take 1 tablet by mouth every 6 (six) hours as needed for severe pain. 01/19/15  Yes Verlee Monte, MD  LORazepam (ATIVAN) 0.5 MG tablet Take 1 tablet (0.5 mg total) by mouth daily. May also  take a full tablet twice daily as needed for anxiety 01/19/15  Yes Verlee Monte, MD  meclizine (ANTIVERT) 25 MG tablet Take 25 mg by mouth 3 (three) times daily as needed for dizziness. Do not combine with Hydroxyzine or any other antihistamine. 11/12/13  Yes Thao P Le, DO  Melatonin 1 MG TABS Take 1 mg by mouth at bedtime.    Yes Historical Provider, MD  mineral oil-hydrophilic petrolatum (AQUAPHOR) ointment Apply 1 application topically daily.   Yes Historical Provider, MD  mirtazapine (REMERON) 45 MG tablet Take 45 mg by mouth at bedtime.   Yes Historical Provider, MD  omeprazole (PRILOSEC) 20 MG capsule Take 20 mg by mouth daily.   Yes Historical Provider, MD  QUEtiapine Fumarate (SEROQUEL XR) 150 MG 24 hr tablet Take 150 mg by mouth daily at 8 pm. Reported on 01/26/2015   Yes Historical Provider, MD  ranitidine (ZANTAC) 150 MG tablet Take 150 mg by mouth 2 (two) times daily as needed for heartburn.   Yes Historical Provider, MD  Sennosides (SENNA LAX PO) Take 2 tablets by mouth 2 (two) times  daily. Take 2 tablets in the morning and take 2 tablets at bedtime.   Yes Historical Provider, MD  travoprost, benzalkonium, (TRAVATAN) 0.004 % ophthalmic solution Place 1 drop into both eyes at bedtime.   Yes Historical Provider, MD  triazolam (HALCION) 0.25 MG tablet Take 0.25 mg by mouth at bedtime.   Yes Historical Provider, MD  Acetic Acid (DOUCHE VINEGAR/WATER VA) Place 1 each around the anus once a week. To prevent UTI    Historical Provider, MD  amoxicillin-clavulanate (AUGMENTIN) 400-57 MG/5ML suspension Take 10 mLs (800 mg total) by mouth 2 (two) times daily. Patient not taking: Reported on 01/26/2015 01/19/15   Verlee Monte, MD  fluticasone (FLOVENT HFA) 110 MCG/ACT inhaler Inhale 1 puff into the lungs 2 (two) times daily as needed (shortness of breath.).    Historical Provider, MD  hydrocortisone (ANUSOL-HC) 2.5 % rectal cream Place 1 application rectally 2 (two) times daily as needed for hemorrhoids or itching.    Historical Provider, MD  nitroGLYCERIN (NITROSTAT) 0.4 MG SL tablet Place 0.4 mg under the tongue every 5 (five) minutes as needed for chest pain. May use 3 times    Historical Provider, MD  nystatin (MYCOSTATIN) 100000 UNIT/ML suspension Take 5 mLs by mouth 3 (three) times daily as needed (mouth pain).     Historical Provider, MD  ondansetron (ZOFRAN) 4 MG tablet Take 1 tablet (4 mg total) by mouth every 6 (six) hours. Patient taking differently: Take 4 mg by mouth every 4 (four) hours as needed for nausea or vomiting.  12/25/14   Hanna Patel-Mills, PA-C  pantoprazole (PROTONIX) 40 MG tablet Take 1 tablet (40 mg total) by mouth daily at 6 (six) AM. Patient not taking: Reported on 01/16/2015 01/14/15   [name], MD  polyethylene glycol (MIRALAX / GLYCOLAX) packet Take 17 g by mouth daily as needed for mild constipation or moderate constipation.     Historical Provider, MD  PRESCRIPTION MEDICATION Take 1 Container by mouth 2 (two) times daily between meals. Reported on  01/26/2015    Historical Provider, MD  triamcinolone (NASACORT ALLERGY 24HR) 55 MCG/ACT AERO nasal inhaler Place 2 sprays into the nose daily as needed (allergies).    Historical Provider, MD  triamcinolone cream (KENALOG) 0.1 % Apply 1 application topically 3 (three) times daily as needed (worsening symtoms then resume as needed).    Historical Provider, MD  Physical Exam: Filed Vitals:   01/26/15 1533 01/26/15 1559 01/26/15 1633 01/26/15 1734  BP: 148/92  205/89 194/83  Pulse: 93  84 90  Temp: 100.4 F (38 C)     TempSrc: Rectal     Resp: 14  14 14   Weight:  53.524 kg (118 lb)    SpO2: 93%  97% 97%    Wt Readings from Last 3 Encounters:  01/26/15 53.524 kg (118 lb)  01/17/15 53.751 kg (118 lb 8 oz)  01/11/15 51.9 kg (114 lb 6.7 oz)    General:  alert awake anxious, oriented to self and place only  Eyes: PERRL, normal lids, irises & conjunctiva ENT: grossly normal hearing, lips & tongue Neck: no LAD, masses or thyromegaly Cardiovascular: RRR, no m/r/g. No LE edema. Telemetry: SR, no arrhythmias  Respiratory: CTA bilaterally, no w/r/r. Normal respiratory effort. Abdomen: soft, ntnd Skin: no rash or induration seen on limited exam Musculoskeletal: grossly normal tone BUE/BLE diffuse tenderness all over ,  Psychiatric: anxious, flat affect  Neurologic: grossly non-focal.          Labs on Admission:  Basic Metabolic Panel:  Recent Labs Lab 01/26/15 1547  NA 143  K 2.7*  CL 105  CO2 28  GLUCOSE 103*  BUN 21*  CREATININE 0.67  CALCIUM 9.3   Liver Function Tests:  Recent Labs Lab 01/26/15 1547  AST 22  ALT 14  ALKPHOS 67  BILITOT 0.5  PROT 7.3  ALBUMIN 3.2*    Recent Labs Lab 01/26/15 1547  LIPASE 21   No results for input(s): AMMONIA in the last 168 hours. CBC:  Recent Labs Lab 01/26/15 1547  WBC 8.2  NEUTROABS 5.8  HGB 12.2  HCT 39.4  MCV 92.7  PLT 385   Cardiac Enzymes:  Recent Labs Lab 01/26/15 1557  CKTOTAL 62    BNP (last 3  results) No results for input(s): BNP in the last 8760 hours.  ProBNP (last 3 results) No results for input(s): PROBNP in the last 8760 hours.  CBG: No results for input(s): GLUCAP in the last 168 hours.  Radiological Exams on Admission: Ct Head Wo Contrast  01/26/2015  CLINICAL DATA:  Three-day history of fever with confusion EXAM: CT HEAD WITHOUT CONTRAST TECHNIQUE: Contiguous axial images were obtained from the base of the skull through the vertex without intravenous contrast. COMPARISON:  September 09 2014 FINDINGS: Mild diffuse atrophy is stable. There is no intracranial mass, hemorrhage, extra-axial fluid collection, or midline shift. There is small vessel disease throughout much of the centra semiovale bilaterally, stable. No new gray-white compartment lesions are identified. No acute infarct is evident. The bony calvarium appears intact. The mastoid air cells are clear. No intraorbital lesions are identified. Patient has had cataract extractions bilaterally. IMPRESSION: Atrophy with periventricular vessel disease throughout much of the centra semiovale bilaterally. No acute infarct evident. No hemorrhage or mass effect. Electronically Signed   By: Lowella Grip III M.D.   On: 01/26/2015 16:27   Dg Chest Port 1 View  01/26/2015  CLINICAL DATA:  81 year old female with fever confusion and weakness. Initial encounter. EXAM: PORTABLE CHEST 1 VIEW COMPARISON:  01/16/2015 and earlier. FINDINGS: Portable AP semi upright view at 1543 hours. Stable lung volumes. Increased interstitial opacity, but no overt edema or increased effusions. Small left pleural effusion appears stable. No pneumothorax or other new pulmonary opacity. Stable cardiac size and mediastinal contours. Stable visualized osseous structures. IMPRESSION: Increased pulmonary vascular congestion without overt edema. Stable small left pleural  effusion. Electronically Signed   By: Genevie Ann M.D.   On: 01/26/2015 16:23     Assessment/Plan Principal Problem:    Fever, unspecified -Etiology unclear, UA/chest x-ray unremarkable clinically do not suspect meningitis or encephalitis -Suspect viral illness -Check PCR -IV fluids -Supportive care,  -We'll follow-up blood and urine cultures -check CK  Confusion/hallucinations -Suspect some metabolic encephalopathy related to fevers/lateral illness -No neck stiffness no photophobia or meningeal signs -Monitor clinically    Anxiety/depression -She is on large amounts of anti-suppressants, benzos -We will discuss with family about cutting down some of these dosages    COPD (chronic obstructive pulmonary disease -Stable, nebs when necessary    Weakness -Due to #1, PT OT eval been able    CKD (chronic kidney disease), stage III -Stable  DVt proph: lovenox   Code Status:DO NOT RESUSCITATE Family Communication:Done at bedside  sition Plan:admit to telemetry  Time spent:17min  [facility] Pager [phone]

## 2015-01-26 NOTE — ED Notes (Signed)
Pt comes to Ed via EMS, pt c/o is fever for past 3 days and generalized weakness and reported confusion. Pt able to be alert and oriented to place, time and self. Pt comes from [facility] facility. Blood glucose on arrival 125, hr rate 90, bp 148/87, was given 2L O2.  On assessment Temp is 97.4 oral rectal temp is 100.4.

## 2015-01-26 NOTE — ED Notes (Signed)
Pt in CT.

## 2015-01-26 NOTE — Progress Notes (Signed)
PHARMACIST - PHYSICIAN ORDER COMMUNICATION  CONCERNING: P&T Medication Policy on Herbal Medications  DESCRIPTION:  This patient's order for:  melatonin  has been noted.  This product(s) is classified as an "herbal" or natural product. Due to a lack of definitive safety studies or FDA approval, nonstandard manufacturing practices, plus the potential risk of unknown drug-drug interactions while on inpatient medications, the Pharmacy and Therapeutics Committee does not permit the use of "herbal" or natural products of this type within [facility].   ACTION TAKEN: The pharmacy department is unable to verify this order at this time.  Did not suggest an alternative therapy at this time since patient has other bedtime medications ordered that can assist with sleep.   Hershal Coria, PharmD, BCPS Pager: [phone] 01/26/2015 8:01 PM

## 2015-01-26 NOTE — ED Notes (Signed)
Bed: [id number] Expected date:  Expected time:  Means of arrival:  Comments: EMS- 23s F, weakness

## 2015-01-26 NOTE — ED Provider Notes (Signed)
CSN: [id number]     Arrival date & time 01/26/15  51 History   First MD Initiated Contact with Patient 01/26/15 1525     Chief Complaint  Patient presents with  . Fever    102  . Weakness    reported by EMS and facility      (Consider location/radiation/quality/duration/timing/severity/associated sxs/prior Treatment) Patient is a 81 y.o. female presenting with fever and weakness.  Fever Max temp prior to arrival:  102 Temp source:  Tympanic Severity:  Moderate Onset quality:  Gradual Duration:  3 days Timing:  Constant Progression:  Unchanged Chronicity:  New Relieved by:  Nothing Associated symptoms: confusion, cough, dysuria and headaches   Associated symptoms: no chest pain, no diarrhea, no nausea, no rash, no rhinorrhea, no sore throat and no vomiting   Weakness Associated symptoms include headaches. Pertinent negatives include no chest pain, no abdominal pain and no shortness of breath.    Past Medical History  Diagnosis Date  . Osteoporosis   . Depression   . Anxiety   . COPD (chronic obstructive pulmonary disease) ([place])   . GERD (gastroesophageal reflux disease)   . Abuse     benzos/narcotics  . Migraines   . Scoliosis   . Insomnia, unspecified   . Vitamin B deficiency   . Weight loss   . Chronic mental illness   . Arterial tortuosity (aorta) 12/11/2012  . Chronic kidney disease, stage IV (severe) ([place]) 12/11/2012  . Substance abuse     Benzos and Narcotics, she does not get any of those medicines from [facility], we have empahtically told her that.   . Vertigo   . Colitis   . Schatzki's ring 01/14/2015  . Dysphagia 01/11/2015  . Nasal bone fracture 03/30/2014   Past Surgical History  Procedure Laterality Date  . Cholecystectomy    . Partial hysterectomy    . Abdominal hysterectomy    . Cataract extraction    . Esophagogastroduodenoscopy (egd) with propofol N/A 01/13/2015    Procedure: ESOPHAGOGASTRODUODENOSCOPY (EGD) WITH PROPOFOL;  Surgeon: [name], MD;  Location: [facility];  Service: Endoscopy;  Laterality: N/A;   Family History  Problem Relation Age of Onset  . Stroke Mother   . Diabetes Mother   . Cancer Father 46    esophageal  . Heart disease Mother   . Hearing loss Mother    Social History  Substance Use Topics  . Smoking status: Former Smoker -- 0.50 packs/day    Types: Cigarettes  . Smokeless tobacco: Never Used  . Alcohol Use: No   OB History    No data available     Review of Systems  Constitutional: Positive for fever and fatigue.  HENT: Negative for rhinorrhea and sore throat.   Eyes: Negative for visual disturbance.  Respiratory: Positive for cough. Negative for shortness of breath.   Cardiovascular: Negative for chest pain.  Gastrointestinal: Negative for nausea, vomiting, abdominal pain and diarrhea.  Genitourinary: Positive for dysuria. Negative for difficulty urinating.  Musculoskeletal: Negative for back pain and neck pain.  Skin: Negative for rash.  Neurological: Positive for weakness (generalized) and headaches. Negative for syncope.  Psychiatric/Behavioral: Positive for confusion.      Allergies  Macrobid; Azithromycin; Doxycycline; Escitalopram oxalate; Fioricet; Latex; Levofloxacin; Nsaids; Oxycodone; Restasis; Sulfa antibiotics; Tylenol; and Biaxin  Home Medications   Prior to Admission medications   Medication Sig Start Date End Date Taking? Authorizing Provider  acetaminophen (TYLENOL) 325 MG tablet Take 650 mg by mouth every 6 (  six) hours as needed for headache.   Yes Historical Provider, MD  albuterol (PROVENTIL HFA;VENTOLIN HFA) 108 (90 BASE) MCG/ACT inhaler Inhale 1 puff into the lungs 2 (two) times daily as needed for wheezing or shortness of breath.   Yes Historical Provider, MD  aspirin 81 MG chewable tablet Chew 1 tablet (81 mg total) by mouth daily. 12/12/12  Yes [name], MD  Cholecalciferol (VITAMIN D) 1000 UNITS capsule Take 1,000 Units by mouth daily.   Yes  Historical Provider, MD  clorazepate (TRANXENE) 7.5 MG tablet Take 7.5 mg by mouth 2 (two) times daily.  12/23/12  Yes Estill Dooms, MD  Cranberry 425 MG CAPS Take 425 mg by mouth 2 (two) times daily.    Yes Historical Provider, MD  diclofenac (FLECTOR) 1.3 % PTCH Place 1 patch onto the skin every 12 (twelve) hours as needed (For localized pain.).    Yes Historical Provider, MD  divalproex (DEPAKOTE) 250 MG DR tablet Take 250 mg by mouth daily.   Yes Historical Provider, MD  docusate sodium (COLACE) 100 MG capsule Take 100 mg by mouth 2 (two) times daily.   Yes Historical Provider, MD  dorzolamide-timolol (COSOPT) 22.3-6.8 MG/ML ophthalmic solution Place 1 drop into both eyes 2 (two) times daily.   Yes Historical Provider, MD  DULoxetine (CYMBALTA) 30 MG capsule Take 30 mg by mouth daily.   Yes Historical Provider, MD  fluconazole (DIFLUCAN) 100 MG tablet Take 100 mg by mouth daily.   Yes Historical Provider, MD  fludrocortisone (FLORINEF) 0.1 MG tablet Take 0.1 mg by mouth every morning.   Yes Historical Provider, MD  Ginkgo Biloba 120 MG CAPS Take 120 mg by mouth 2 (two) times daily.    Yes Historical Provider, MD  HYDROcodone-acetaminophen (NORCO) 10-325 MG tablet Take 1 tablet by mouth every 6 (six) hours as needed for severe pain. 01/19/15  Yes Verlee Monte, MD  LORazepam (ATIVAN) 0.5 MG tablet Take 1 tablet (0.5 mg total) by mouth daily. May also take a full tablet twice daily as needed for anxiety 01/19/15  Yes Verlee Monte, MD  meclizine (ANTIVERT) 25 MG tablet Take 25 mg by mouth 3 (three) times daily as needed for dizziness. Do not combine with Hydroxyzine or any other antihistamine. 11/12/13  Yes Thao P Le, DO  Melatonin 1 MG TABS Take 1 mg by mouth at bedtime.    Yes Historical Provider, MD  mineral oil-hydrophilic petrolatum (AQUAPHOR) ointment Apply 1 application topically daily.   Yes Historical Provider, MD  mirtazapine (REMERON) 45 MG tablet Take 45 mg by mouth at bedtime.   Yes  Historical Provider, MD  omeprazole (PRILOSEC) 20 MG capsule Take 20 mg by mouth daily.   Yes Historical Provider, MD  QUEtiapine Fumarate (SEROQUEL XR) 150 MG 24 hr tablet Take 150 mg by mouth daily at 8 pm. Reported on 01/26/2015   Yes Historical Provider, MD  ranitidine (ZANTAC) 150 MG tablet Take 150 mg by mouth 2 (two) times daily as needed for heartburn.   Yes Historical Provider, MD  Sennosides (SENNA LAX PO) Take 2 tablets by mouth 2 (two) times daily. Take 2 tablets in the morning and take 2 tablets at bedtime.   Yes Historical Provider, MD  travoprost, benzalkonium, (TRAVATAN) 0.004 % ophthalmic solution Place 1 drop into both eyes at bedtime.   Yes Historical Provider, MD  triazolam (HALCION) 0.25 MG tablet Take 0.25 mg by mouth at bedtime.   Yes Historical Provider, MD  Acetic Acid (DOUCHE VINEGAR/WATER  VA) Place 1 each around the anus once a week. To prevent UTI    Historical Provider, MD  amoxicillin-clavulanate (AUGMENTIN) 400-57 MG/5ML suspension Take 10 mLs (800 mg total) by mouth 2 (two) times daily. Patient not taking: Reported on 01/26/2015 01/19/15   Verlee Monte, MD  fluticasone (FLOVENT HFA) 110 MCG/ACT inhaler Inhale 1 puff into the lungs 2 (two) times daily as needed (shortness of breath.).    Historical Provider, MD  hydrocortisone (ANUSOL-HC) 2.5 % rectal cream Place 1 application rectally 2 (two) times daily as needed for hemorrhoids or itching.    Historical Provider, MD  nitroGLYCERIN (NITROSTAT) 0.4 MG SL tablet Place 0.4 mg under the tongue every 5 (five) minutes as needed for chest pain. May use 3 times    Historical Provider, MD  nystatin (MYCOSTATIN) 100000 UNIT/ML suspension Take 5 mLs by mouth 3 (three) times daily as needed (mouth pain).     Historical Provider, MD  ondansetron (ZOFRAN) 4 MG tablet Take 1 tablet (4 mg total) by mouth every 6 (six) hours. Patient taking differently: Take 4 mg by mouth every 4 (four) hours as needed for nausea or vomiting.  12/25/14    Hanna Patel-Mills, PA-C  pantoprazole (PROTONIX) 40 MG tablet Take 1 tablet (40 mg total) by mouth daily at 6 (six) AM. Patient not taking: Reported on 01/16/2015 01/14/15   [name], MD  polyethylene glycol (MIRALAX / GLYCOLAX) packet Take 17 g by mouth daily as needed for mild constipation or moderate constipation.     Historical Provider, MD  PRESCRIPTION MEDICATION Take 1 Container by mouth 2 (two) times daily between meals. Reported on 01/26/2015    Historical Provider, MD  triamcinolone (NASACORT ALLERGY 24HR) 55 MCG/ACT AERO nasal inhaler Place 2 sprays into the nose daily as needed (allergies).    Historical Provider, MD  triamcinolone cream (KENALOG) 0.1 % Apply 1 application topically 3 (three) times daily as needed (worsening symtoms then resume as needed).    Historical Provider, MD   BP 171/93 mmHg  Pulse 96  Temp(Src) 98.2 F (36.8 C) (Oral)  Resp 18  Ht 5' (1.524 m)  Wt 114 lb 3.2 oz (51.8 kg)  BMI 22.30 kg/m2  SpO2 98% Physical Exam  Constitutional: She appears well-developed and well-nourished. No distress.  HENT:  Head: Normocephalic.  Mouth/Throat: Mucous membranes are dry.  Aged contusion right forehead, yellow in color  Eyes: Conjunctivae and EOM are normal.  Neck: Normal range of motion.  Cardiovascular: Normal rate, regular rhythm, normal heart sounds and intact distal pulses.  Exam reveals no gallop and no friction rub.   No murmur heard. Pulmonary/Chest: Effort normal and breath sounds normal. No respiratory distress. She has no wheezes. She has no rales.  Abdominal: Soft. She exhibits no distension. There is no tenderness. There is no guarding.  Musculoskeletal: She exhibits no edema or tenderness.  Neurological: She is alert.  Reports it is January, Alert and oriented to self and location  Skin: Skin is warm and dry. No rash noted. She is not diaphoretic. No erythema.  Nursing note and vitals reviewed.   ED Course  Procedures (including critical  care time) Labs Review Labs Reviewed  COMPREHENSIVE METABOLIC PANEL - Abnormal; Notable for the following:    Potassium 2.7 (*)    Glucose, Bld 103 (*)    BUN 21 (*)    Albumin 3.2 (*)    All other components within normal limits  URINALYSIS, ROUTINE W REFLEX MICROSCOPIC (NOT AT [facility]) -  Abnormal; Notable for the following:    Color, Urine AMBER (*)    APPearance CLOUDY (*)    Leukocytes, UA TRACE (*)    All other components within normal limits  URINE MICROSCOPIC-ADD ON - Abnormal; Notable for the following:    Squamous Epithelial / LPF 0-5 (*)    Bacteria, UA FEW (*)    Casts GRANULAR CAST (*)    Crystals CA OXALATE CRYSTALS (*)    All other components within normal limits  CULTURE, BLOOD (ROUTINE X 2)  CULTURE, BLOOD (ROUTINE X 2)  URINE CULTURE  CBC WITH DIFFERENTIAL/PLATELET  LIPASE, BLOOD  CK  INFLUENZA PANEL BY PCR (TYPE A & B, H1N1)  INFLUENZA PANEL BY PCR (TYPE A & B, H1N1)  CBC  COMPREHENSIVE METABOLIC PANEL  I-STAT CG4 LACTIC ACID, ED  I-STAT TROPOININ, ED  I-STAT CG4 LACTIC ACID, ED    Imaging Review Ct Head Wo Contrast  01/26/2015  CLINICAL DATA:  Three-day history of fever with confusion EXAM: CT HEAD WITHOUT CONTRAST TECHNIQUE: Contiguous axial images were obtained from the base of the skull through the vertex without intravenous contrast. COMPARISON:  September 09 2014 FINDINGS: Mild diffuse atrophy is stable. There is no intracranial mass, hemorrhage, extra-axial fluid collection, or midline shift. There is small vessel disease throughout much of the centra semiovale bilaterally, stable. No new gray-white compartment lesions are identified. No acute infarct is evident. The bony calvarium appears intact. The mastoid air cells are clear. No intraorbital lesions are identified. Patient has had cataract extractions bilaterally. IMPRESSION: Atrophy with periventricular vessel disease throughout much of the centra semiovale bilaterally. No acute infarct evident. No  hemorrhage or mass effect. Electronically Signed   By: Lowella Grip III M.D.   On: 01/26/2015 16:27   Dg Chest Port 1 View  01/26/2015  CLINICAL DATA:  81 year old female with fever confusion and weakness. Initial encounter. EXAM: PORTABLE CHEST 1 VIEW COMPARISON:  01/16/2015 and earlier. FINDINGS: Portable AP semi upright view at 1543 hours. Stable lung volumes. Increased interstitial opacity, but no overt edema or increased effusions. Small left pleural effusion appears stable. No pneumothorax or other new pulmonary opacity. Stable cardiac size and mediastinal contours. Stable visualized osseous structures. IMPRESSION: Increased pulmonary vascular congestion without overt edema. Stable small left pleural effusion. Electronically Signed   By: Genevie Ann M.D.   On: 01/26/2015 16:23   I have personally reviewed and evaluated these images and lab results as part of my medical decision-making.   EKG Interpretation   Date/Time:  Wednesday January 26 2015 15:26:28 EST Ventricular Rate:  92 PR Interval:  109 QRS Duration: 98 QT Interval:  381 QTC Calculation: 471 R Axis:   -58 Text Interpretation:  Sinus rhythm Left anterior fascicular block Abnormal  R-wave progression, early transition U wave seen in lead II Borderline  repolarization abnormality No significant change since prior tracings in  2005 and most recent Confirmed by [facility] MD, Cameren Odwyer ([id number]) on  01/26/2015 4:59:24 PM      MDM   Final diagnoses:  Fever, unspecified fever cause  Encephalopathy  Hypokalemia   A 81 year old female with a history of COPD, recent admission for dysphagia found to have a Schatzki's ring, chronic mental illness, CK D stage IV presents from holding nursing facility for concern of 3 days of fever. Patient is hemodynamically stable on arrival, alert and oriented. Given patient reporting some cough and dysuria, suspicion for both UTI and age raised by history, and patient was empirically ordered Zosyn  as initial antibiotic.   Patient also reports recent fall, headache and CT head ordered showing no acute abnormality.  CXR shows no clear sign of pneumonia. Urinalysis negative.  CMP shows hypokalemia 2.7 and K ordered.  Influenza ordered given fever and diffuse areas of pain.  Unclear etiology of fever at this time. Doubt meningitis and pt with no meningeal signs. No skin findings. No signs NMS.  Pt with similar presentation prior to last admission.  Patient has not been at baseline over last 3 days per daughter, with hallucinating on Sunday and pt not being oriented to time today.  Will admit for altered mental status and fever of unclear etiology.    Gareth Morgan, MD 01/27/15 [phone]

## 2015-01-26 NOTE — ED Notes (Signed)
Pt can go to floor at 19:00.

## 2015-01-27 ENCOUNTER — Observation Stay (HOSPITAL_COMMUNITY): Payer: Medicare Other

## 2015-01-27 DIAGNOSIS — R531 Weakness: Secondary | ICD-10-CM | POA: Diagnosis not present

## 2015-01-27 DIAGNOSIS — R509 Fever, unspecified: Secondary | ICD-10-CM | POA: Diagnosis not present

## 2015-01-27 DIAGNOSIS — N183 Chronic kidney disease, stage 3 (moderate): Secondary | ICD-10-CM | POA: Diagnosis not present

## 2015-01-27 DIAGNOSIS — J069 Acute upper respiratory infection, unspecified: Secondary | ICD-10-CM | POA: Diagnosis not present

## 2015-01-27 LAB — COMPREHENSIVE METABOLIC PANEL
ALT: 15 U/L (ref 14–54)
ANION GAP: 10 (ref 5–15)
AST: 23 U/L (ref 15–41)
Albumin: 2.6 g/dL — ABNORMAL LOW (ref 3.5–5.0)
Alkaline Phosphatase: 59 U/L (ref 38–126)
BILIRUBIN TOTAL: 0.6 mg/dL (ref 0.3–1.2)
BUN: 13 mg/dL (ref 6–20)
CHLORIDE: 109 mmol/L (ref 101–111)
CO2: 25 mmol/L (ref 22–32)
Calcium: 8.6 mg/dL — ABNORMAL LOW (ref 8.9–10.3)
Creatinine, Ser: 0.52 mg/dL (ref 0.44–1.00)
Glucose, Bld: 97 mg/dL (ref 65–99)
POTASSIUM: 2.6 mmol/L — AB (ref 3.5–5.1)
Sodium: 144 mmol/L (ref 135–145)
TOTAL PROTEIN: 6.3 g/dL — AB (ref 6.5–8.1)

## 2015-01-27 LAB — INFLUENZA PANEL BY PCR (TYPE A & B)
H1N1 flu by pcr: NOT DETECTED
H1N1FLUPCR: NOT DETECTED
INFLAPCR: NEGATIVE
INFLAPCR: NEGATIVE
INFLBPCR: NEGATIVE
Influenza B By PCR: NEGATIVE

## 2015-01-27 LAB — URINE CULTURE: Culture: NO GROWTH

## 2015-01-27 LAB — CBC
HEMATOCRIT: 35.1 % — AB (ref 36.0–46.0)
Hemoglobin: 11 g/dL — ABNORMAL LOW (ref 12.0–15.0)
MCH: 29 pg (ref 26.0–34.0)
MCHC: 31.3 g/dL (ref 30.0–36.0)
MCV: 92.6 fL (ref 78.0–100.0)
PLATELETS: 367 10*3/uL (ref 150–400)
RBC: 3.79 MIL/uL — ABNORMAL LOW (ref 3.87–5.11)
RDW: 14.2 % (ref 11.5–15.5)
WBC: 7.6 10*3/uL (ref 4.0–10.5)

## 2015-01-27 MED ORDER — POTASSIUM CHLORIDE 10 MEQ/100ML IV SOLN
10.0000 meq | INTRAVENOUS | Status: AC
  Administered 2015-01-27 (×5): 10 meq via INTRAVENOUS
  Filled 2015-01-27 (×4): qty 100

## 2015-01-27 MED ORDER — ENSURE ENLIVE PO LIQD
237.0000 mL | Freq: Two times a day (BID) | ORAL | Status: DC
  Administered 2015-01-27 – 2015-01-31 (×8): 237 mL via ORAL

## 2015-01-27 MED ORDER — POTASSIUM CHLORIDE CRYS ER 20 MEQ PO TBCR
40.0000 meq | EXTENDED_RELEASE_TABLET | Freq: Once | ORAL | Status: AC
  Administered 2015-01-27: 40 meq via ORAL
  Filled 2015-01-27: qty 2

## 2015-01-27 NOTE — Progress Notes (Signed)
CRITICAL VALUE ALERT  Critical value received:  Carnella Gu[place] I   Date of notification:  01/27/2015  Time of notification:  [id number]  Critical value read back:Yes.    Nurse who received alert:  Carnella Gu[place]ara I  MD notified (1st page):  Lamar Blinks, NP  Time of first page:  [phone]  NP ordered 5 runs of K+

## 2015-01-27 NOTE — Progress Notes (Signed)
PT Cancellation Note  Patient Details Name: Taylor Roth MRN: [id number] DOB: 1928/06/13   Cancelled Treatment:    Reason Eval/Treat Not Completed: Attempted PT eval-pt declined to participate at this time "Honey.Marland KitchenMarland KitchenIm too sore to move around". Will check back another time/day, likely tomorrow, to perform evaluation.    Weston Anna, MPT Pager: [phone]

## 2015-01-27 NOTE — Progress Notes (Addendum)
[name] PROGRESS NOTE  Taylor Roth [id number] DOB: September 30, 1928 DOA: 01/26/2015 PCP: Pcp Not In System  Assessment/Plan: Fever, unspecified -Etiology unclear, UA/chest x-ray unremarkable clinically do not suspect meningitis or encephalitis -Suspect viral illness, Flu PCR negative -afebrile now, -IV fluids -Supportive care,  -We'll follow-up blood and urine cultures -CK-WNL  Severe Hypokalemia -replace -Bmet this evening and in am  Confusion/hallucinations -Suspect some metabolic encephalopathy related to fevers/lateral illness -No neck stiffness no photophobia or meningeal signs -improved   Anxiety/depression -She is on large amounts of anti-suppressants, benzos -We will discuss with family about cutting down some of these dosages, stopped ativan and Triazolam at night, takes chlorazepate BID   COPD (chronic obstructive pulmonary disease -Stable, nebs when necessary   Weakness -Due to #1, PT OT eval    CKD (chronic kidney disease), stage III -Stable  R hip pain -chronic for years per pt, given increase pain and recent falls -will check Xray  DVt proph: lovenox  Code Status:DNR Family Communication: none at bedside Disposition Plan: back to ALF tomorrow if stable     HPI/Subjective: Feels weak, aching all over, no fevers  Objective: Filed Vitals:   01/27/15 0424 01/27/15 1349  BP: 139/63 157/87  Pulse: 92 91  Temp: 98 F (36.7 C) 97.7 F (36.5 C)  Resp: 18 18    Intake/Output Summary (Last 24 hours) at 01/27/15 1410 Last data filed at 01/27/15 0700  Gross per 24 hour  Intake    890 ml  Output      0 ml  Net    890 ml   Filed Weights   01/26/15 1559 01/26/15 1929  Weight: 53.524 kg (118 lb) 51.8 kg (114 lb 3.2 oz)    Exam:   General:  AAOx3  Cardiovascular: S1S2/RRR  Respiratory: CTAB  Abdomen: soft, NT, BS present  Musculoskeletal: no edema c/c  Neuro: non focal   Data Reviewed: Basic Metabolic  Panel:  Recent Labs Lab 01/26/15 1547 01/27/15 0420  NA 143 144  K 2.7* 2.6*  CL 105 109  CO2 28 25  GLUCOSE 103* 97  BUN 21* 13  CREATININE 0.67 0.52  CALCIUM 9.3 8.6*   Liver Function Tests:  Recent Labs Lab 01/26/15 1547 01/27/15 0420  AST 22 23  ALT 14 15  ALKPHOS 67 59  BILITOT 0.5 0.6  PROT 7.3 6.3*  ALBUMIN 3.2* 2.6*    Recent Labs Lab 01/26/15 1547  LIPASE 21   No results for input(s): AMMONIA in the last 168 hours. CBC:  Recent Labs Lab 01/26/15 1547 01/27/15 0420  WBC 8.2 7.6  NEUTROABS 5.8  --   HGB 12.2 11.0*  HCT 39.4 35.1*  MCV 92.7 92.6  PLT 385 367   Cardiac Enzymes:  Recent Labs Lab 01/26/15 1557  CKTOTAL 62   BNP (last 3 results) No results for input(s): BNP in the last 8760 hours.  ProBNP (last 3 results) No results for input(s): PROBNP in the last 8760 hours.  CBG: No results for input(s): GLUCAP in the last 168 hours.  Recent Results (from the past 240 hour(s))  Urine culture     Status: None (Preliminary result)   Collection Time: 01/26/15  3:59 PM  Result Value Ref Range Status   Specimen Description URINE, CATHETERIZED  Final   Special Requests NONE  Final   Culture   Final    NO GROWTH < 24 HOURS Performed at [facility]    Report Status PENDING  Incomplete  Studies: Ct Head Wo Contrast  01/26/2015  CLINICAL DATA:  Three-day history of fever with confusion EXAM: CT HEAD WITHOUT CONTRAST TECHNIQUE: Contiguous axial images were obtained from the base of the skull through the vertex without intravenous contrast. COMPARISON:  September 09 2014 FINDINGS: Mild diffuse atrophy is stable. There is no intracranial mass, hemorrhage, extra-axial fluid collection, or midline shift. There is small vessel disease throughout much of the centra semiovale bilaterally, stable. No new gray-white compartment lesions are identified. No acute infarct is evident. The bony calvarium appears intact. The mastoid air cells are clear.  No intraorbital lesions are identified. Patient has had cataract extractions bilaterally. IMPRESSION: Atrophy with periventricular vessel disease throughout much of the centra semiovale bilaterally. No acute infarct evident. No hemorrhage or mass effect. Electronically Signed   By: Lowella Grip III M.D.   On: 01/26/2015 16:27   Dg Chest Port 1 View  01/26/2015  CLINICAL DATA:  81 year old female with fever confusion and weakness. Initial encounter. EXAM: PORTABLE CHEST 1 VIEW COMPARISON:  01/16/2015 and earlier. FINDINGS: Portable AP semi upright view at 1543 hours. Stable lung volumes. Increased interstitial opacity, but no overt edema or increased effusions. Small left pleural effusion appears stable. No pneumothorax or other new pulmonary opacity. Stable cardiac size and mediastinal contours. Stable visualized osseous structures. IMPRESSION: Increased pulmonary vascular congestion without overt edema. Stable small left pleural effusion. Electronically Signed   By: Genevie Ann M.D.   On: 01/26/2015 16:23    Scheduled Meds: . aspirin  81 mg Oral Daily  . clorazepate  7.5 mg Oral BID  . divalproex  250 mg Oral Daily  . docusate sodium  100 mg Oral BID  . dorzolamide-timolol  1 drop Both Eyes BID  . DULoxetine  30 mg Oral Daily  . enoxaparin (LOVENOX) injection  40 mg Subcutaneous Q24H  . famotidine  20 mg Oral Daily  . feeding supplement (ENSURE ENLIVE)  237 mL Oral BID BM  . fluconazole  100 mg Oral Daily  . fludrocortisone  0.1 mg Oral q morning - 10a  . latanoprost  1 drop Both Eyes QHS  . mirtazapine  45 mg Oral QHS  . pantoprazole  40 mg Oral Daily  . potassium chloride  40 mEq Oral Once  . QUEtiapine Fumarate  150 mg Oral Q2000  . triazolam  0.25 mg Oral QHS   Continuous Infusions: . sodium chloride 75 mL/hr at 01/27/15 0424   Antibiotics Given (last 72 hours)    None      Principal Problem:   Fever, unspecified Active Problems:   Anxiety   COPD (chronic obstructive  pulmonary disease) ([facility])   Weakness   CKD (chronic kidney disease), stage III   Altered mental status   Fever    Time spent: 51min    Caleel Kiner  [name] Pager [phone]. If 7PM-7AM, please contact night-coverage at [email or web address], password [facility] 01/27/2015, 2:10 PM  LOS: 1 day

## 2015-01-27 NOTE — Progress Notes (Signed)
Initial Nutrition Assessment  DOCUMENTATION CODES:   Non-severe (moderate) malnutrition in context of acute illness/injury  INTERVENTION:  - Will order Ensure Enlive po BID, each supplement provides 350 kcal and 20 grams of protein - Encourage PO intakes at meals and with supplements - Meal assistance as needed - RD will continue to monitor for needs  NUTRITION DIAGNOSIS:   Swallowing difficulty related to dysphagia as evidenced by per patient/family report.  GOAL:   Patient will meet greater than or equal to 90% of their needs  MONITOR:   PO intake, Supplement acceptance, Weight trends, Labs, Skin, I & O's  REASON FOR ASSESSMENT:   Malnutrition Screening Tool  ASSESSMENT:   81 y.o. female with a past medical history of osteoporosis, depression, anxiety, COPD, GERD, migraines headaches, chronic kidney disease, chronic mental illness who was brought to the [facility] emergency department from her assisted living facility due to fevers, some confusion, and poor by mouth intake.  Pt seen for MST. BMI indicates normal weight. No intakes documented. Pt sleeping at time of RD visit and no family/visitors present. Called pt's name x5 during visit, she awoke once for a brief moment and fell back to sleep. Breakfast tray in room, untouched, and pt without any teeth and no dentures in mouth at this time.   Notes indicate pt with hx of dysphagia; will continue to monitor for needs related to diet texture. Physical assessment showed mild muscle wasting to upper body. Per chart review, pt has lost 4 lbs (3.4% body weight) in the past 11 days which is significant for time frame.   Pt likely not meeting needs at this time. Medications reviewed. Labs reviewed; K: 2.6 mg/dL, Ca: 8.6 mg/dL.   Diet Order:  DIET DYS 3 Room service appropriate?: Yes; Fluid consistency:: Thin  Skin:  Reviewed, no issues  Last BM:  12/14  Height:   Ht Readings from Last 1 Encounters:  01/26/15 5'  (1.524 m)    Weight:   Wt Readings from Last 1 Encounters:  01/26/15 114 lb 3.2 oz (51.8 kg)    Ideal Body Weight:  45.45 kg (kg)  BMI:  Body mass index is 22.3 kg/(m^2).  Estimated Nutritional Needs:   Kcal:  1200-1400  Protein:  45-52 grams  Fluid:  1.8-2 L/day  EDUCATION NEEDS:   No education needs identified at this time     Jarome Matin, RD, LDN Inpatient Clinical Dietitian Pager # [phone] After hours/weekend pager # [phone]

## 2015-01-27 NOTE — NC FL2 (Signed)
[place] LEVEL OF CARE SCREENING TOOL     IDENTIFICATION  Patient Name: Taylor Roth Birthdate: [birth year] Sex: female Admission Date (Current Location): 01/26/2015  [facility] and [place] Number: Herbalist and Address:  [facility],  [place] [address] [place]      Provider Number: [phone]  Attending Physician Name and Address:  Domenic Polite, MD  Relative Name and Phone Number:       Current Level of Care: Hospital Recommended Level of Care: [place] Prior Approval Number:    Date Approved/Denied:   PASRR Number:    Discharge Plan: Other (Comment)    Current Diagnoses: Patient Active Problem List   Diagnosis Date Noted  . Altered mental status 01/26/2015  . Fever 01/26/2015  . Nausea and vomiting 01/17/2015  . Esophageal dysmotilities   . Fever, unspecified   . Metabolic encephalopathy   . Urinary tract infectious disease   . Aspiration pneumonia ([place]) 01/16/2015  . Weakness 01/14/2015  . Schatzki's ring 01/14/2015  . CKD (chronic kidney disease), stage III 01/14/2015  . Esophagus disorder   . Dysphagia 01/11/2015  . UTI (lower urinary tract infection) 03/30/2014  . Hearing loss, sensorineural 03/04/2014  . Nasal lesion 03/04/2014  . Unspecified constipation 12/12/2012  . Hypokalemia 12/11/2012  . Orthostatic hypotension 12/11/2012  . Arterial tortuosity (aorta) 12/11/2012  . Protein-calorie malnutrition, severe ([place]) 12/10/2012  . Decreased rectal sphincter tone 11/21/2012  . Idiopathic scoliosis 07/08/2012  . Macular degeneration 06/26/2011  . Vision disturbance 06/26/2011  . Glaucoma 06/26/2011  . Anemia 04/17/2011  . Drug-seeking behavior 02/26/2011  . Osteoporosis 02/26/2011  . Depression   . Anxiety   . COPD (chronic obstructive pulmonary disease) ([place])   . GERD (gastroesophageal reflux disease)   . Migraines   . Insomnia, unspecified     Orientation RESPIRATION BLADDER  Height & Weight    Self, Time, Place  Normal Continent   114 lbs.  BEHAVIORAL SYMPTOMS/MOOD NEUROLOGICAL BOWEL NUTRITION STATUS      Continent    AMBULATORY STATUS COMMUNICATION OF NEEDS Skin   Supervision Verbally Normal                       Personal Care Assistance Level of Assistance    Bathing Assistance: Limited assistance Feeding assistance: Limited assistance Dressing Assistance: Limited assistance     Functional Limitations Info             SPECIAL CARE FACTORS FREQUENCY                       Contractures      Additional Factors Info                  Current Medications (01/27/2015):  This is the current hospital active medication list Current Facility-Administered Medications  Medication Dose Route Frequency Provider Last Rate Last Dose  . 0.9 %  sodium chloride infusion   Intravenous Continuous Domenic Polite, MD 75 mL/hr at 01/27/15 0424    . acetaminophen (TYLENOL) tablet 650 mg  650 mg Oral Q6H PRN Domenic Polite, MD      . albuterol (PROVENTIL) (2.5 MG/3ML) 0.083% nebulizer solution 2.5 mg  2.5 mg Inhalation BID PRN Domenic Polite, MD      . aspirin chewable tablet 81 mg  81 mg Oral Daily Domenic Polite, MD      . budesonide (PULMICORT) nebulizer solution 0.25 mg  0.25 mg  Nebulization BID PRN Domenic Polite, MD      . clorazepate (TRANXENE) tablet 7.5 mg  7.5 mg Oral BID Domenic Polite, MD   7.5 mg at 01/26/15 2205  . diclofenac (FLECTOR) 1.3 % 1 patch  1 patch Transdermal Q12H PRN Domenic Polite, MD      . divalproex (DEPAKOTE) DR tablet 250 mg  250 mg Oral Daily Domenic Polite, MD      . docusate sodium (COLACE) capsule 100 mg  100 mg Oral BID Domenic Polite, MD   100 mg at 01/26/15 2208  . dorzolamide-timolol (COSOPT) 22.3-6.8 MG/ML ophthalmic solution 1 drop  1 drop Both Eyes BID Domenic Polite, MD   1 drop at 01/26/15 2211  . DULoxetine (CYMBALTA) DR capsule 30 mg  30 mg Oral Daily Domenic Polite, MD      . enoxaparin (LOVENOX)  injection 40 mg  40 mg Subcutaneous Q24H Domenic Polite, MD   40 mg at 01/26/15 2212  . famotidine (PEPCID) tablet 20 mg  20 mg Oral Daily Domenic Polite, MD      . fluconazole (DIFLUCAN) tablet 100 mg  100 mg Oral Daily Domenic Polite, MD   100 mg at 01/26/15 2213  . fludrocortisone (FLORINEF) tablet 0.1 mg  0.1 mg Oral q morning - 10a Domenic Polite, MD      . HYDROcodone-acetaminophen (NORCO/VICODIN) 5-325 MG per tablet 1-2 tablet  1-2 tablet Oral Q6H PRN Rhetta Mura Schorr, NP      . hydrocortisone (ANUSOL-HC) 2.5 % rectal cream 1 application  1 application Rectal BID PRN Domenic Polite, MD      . latanoprost (XALATAN) 0.005 % ophthalmic solution 1 drop  1 drop Both Eyes QHS Domenic Polite, MD   1 drop at 01/26/15 2211  . mirtazapine (REMERON) tablet 45 mg  45 mg Oral QHS Domenic Polite, MD   45 mg at 01/26/15 2206  . nitroGLYCERIN (NITROSTAT) SL tablet 0.4 mg  0.4 mg Sublingual Q5 min PRN Domenic Polite, MD      . ondansetron [facility]) tablet 4 mg  4 mg Oral Q6H PRN Domenic Polite, MD       Or  . ondansetron (ZOFRAN) injection 4 mg  4 mg Intravenous Q6H PRN Domenic Polite, MD      . pantoprazole (PROTONIX) EC tablet 40 mg  40 mg Oral Daily Domenic Polite, MD      . polyethylene glycol (MIRALAX / GLYCOLAX) packet 17 g  17 g Oral Daily PRN Domenic Polite, MD      . potassium chloride 10 mEq in 100 mL IVPB  10 mEq Intravenous Q1 Hr x 5 Rhetta Mura Schorr, NP   10 mEq at 01/27/15 0743  . potassium chloride SA (K-DUR,KLOR-CON) CR tablet 40 mEq  40 mEq Oral Once Domenic Polite, MD      . potassium chloride SA (K-DUR,KLOR-CON) CR tablet 40 mEq  40 mEq Oral Once Domenic Polite, MD      . QUEtiapine (SEROQUEL XR) 24 hr tablet 150 mg  150 mg Oral Q2000 Domenic Polite, MD   150 mg at 01/26/15 2209  . triamcinolone (NASACORT) nasal inhaler 2 spray  2 spray Nasal Daily PRN Domenic Polite, MD      . triazolam (HALCION) tablet 0.25 mg  0.25 mg Oral QHS Domenic Polite, MD   0.25 mg at 01/26/15 2207      Discharge Medications: Please see discharge summary for a list of discharge medications.  Relevant Imaging Results:  Relevant Lab Results:   Additional  Information SSN # [id number]  Ludwig Clarks, LCSW

## 2015-01-28 ENCOUNTER — Observation Stay (HOSPITAL_COMMUNITY): Payer: Medicare Other

## 2015-01-28 DIAGNOSIS — J069 Acute upper respiratory infection, unspecified: Secondary | ICD-10-CM | POA: Diagnosis not present

## 2015-01-28 DIAGNOSIS — R509 Fever, unspecified: Secondary | ICD-10-CM | POA: Diagnosis not present

## 2015-01-28 DIAGNOSIS — R531 Weakness: Secondary | ICD-10-CM | POA: Diagnosis not present

## 2015-01-28 LAB — CBC
HCT: 37.2 % (ref 36.0–46.0)
Hemoglobin: 11.8 g/dL — ABNORMAL LOW (ref 12.0–15.0)
MCH: 28.6 pg (ref 26.0–34.0)
MCHC: 31.7 g/dL (ref 30.0–36.0)
MCV: 90.1 fL (ref 78.0–100.0)
PLATELETS: 424 10*3/uL — AB (ref 150–400)
RBC: 4.13 MIL/uL (ref 3.87–5.11)
RDW: 13.9 % (ref 11.5–15.5)
WBC: 7.1 10*3/uL (ref 4.0–10.5)

## 2015-01-28 LAB — BASIC METABOLIC PANEL
Anion gap: 11 (ref 5–15)
BUN: 7 mg/dL (ref 6–20)
CO2: 28 mmol/L (ref 22–32)
CREATININE: 0.59 mg/dL (ref 0.44–1.00)
Calcium: 9 mg/dL (ref 8.9–10.3)
Chloride: 104 mmol/L (ref 101–111)
GFR calc Af Amer: >60 mL/min (ref >60)
GLUCOSE: 101 mg/dL — AB (ref 65–99)
Potassium: 3.2 mmol/L — ABNORMAL LOW (ref 3.5–5.1)
SODIUM: 143 mmol/L (ref 135–145)

## 2015-01-28 MED ORDER — POTASSIUM CHLORIDE CRYS ER 20 MEQ PO TBCR
40.0000 meq | EXTENDED_RELEASE_TABLET | Freq: Two times a day (BID) | ORAL | Status: DC
  Administered 2015-01-28 – 2015-01-29 (×2): 40 meq via ORAL
  Filled 2015-01-28 (×2): qty 2

## 2015-01-28 NOTE — Evaluation (Addendum)
Physical Therapy Evaluation Patient Details Name: Taylor Roth MRN: [id number] DOB: July 26, 1928 Today's Date: 01/28/2015   History of Present Illness  81 y.o. female with h/o depression, COPD, GERD, migraines, scoliosis, CKD with recent admit for PNA/UTI now admitted from ALF with fever and hallucinations. Dx: ? viral infection. Pt also reporting R foot pain.  Clinical Impression  Pt admitted with above diagnosis. Pt currently with functional limitations due to the deficits listed below (see PT Problem List). Mod/max assist for bed to recliner transfer, mobility limited by R foot pain. She was not able to ambulate today.  Pt reports her R foot was run over by a WC at her ALF. Dorsum of R foot is tender to touch and R ankle AROM is significantly limited by pain.  Noted pt walked 160' on 01/18/15 during recent hospital admission. Pt has had a significant decline in mobility. Consider R foot imaging if appropriate. Pt will benefit from skilled PT to increase their independence and safety with mobility to allow discharge to the venue listed below.       Follow Up Recommendations SNF (vs ALF depending on level of assistance available at ALF)    Equipment Recommendations  Wheelchair (measurements PT)    Recommendations for Other Services       Precautions / Restrictions Precautions Precautions: Fall Precaution Comments: pt is blind Restrictions Weight Bearing Restrictions: No      Mobility  Bed Mobility Overal bed mobility: Needs Assistance Bed Mobility: Supine to Sit     Supine to sit: Mod assist;HOB elevated     General bed mobility comments: mod assist for advancing BLEs, min A to raise trunk  Transfers Overall transfer level: Needs assistance Equipment used: Rolling walker (2 wheeled) Transfers: Sit to/from [facility] Sit to Stand: Mod assist;From elevated surface Stand pivot transfers: Max assist       General transfer comment: mod A to rise and for  balance; increased time to pivot  to recliner, mod/max assist for balance, limited by pain in R foot with weightbearing  Ambulation/Gait     Assistive device: Rolling walker (2 wheeled)       General Gait Details: unable due to R foot pain  Stairs            Wheelchair Mobility    Modified Rankin (Stroke Patients Only)       Balance Overall balance assessment: Needs assistance   Sitting balance-Leahy Scale: Fair     Standing balance support: Bilateral upper extremity supported Standing balance-Leahy Scale: Poor                               Pertinent Vitals/Pain Pain Assessment: Faces Pain Score: 6  Pain Location: R foot with touch and with movement Pain Descriptors / Indicators: Sore Pain Intervention(s): Limited activity within patient's tolerance;Monitored during session;Patient requesting pain meds-RN notified;Repositioned    Home Living Family/patient expects to be discharged to:: Assisted living               Home Equipment: Kasandra Knudsen - single point;Walker - 4 wheels      Prior Function Level of Independence: Independent with assistive device(s)         Comments: pt uses rollator for ambulation      Hand Dominance        Extremity/Trunk Assessment   Upper Extremity Assessment: Generalized weakness           Lower Extremity  Assessment: LLE deficits/detail;RLE deficits/detail RLE Deficits / Details: dorsum of R foot tender to touch (pt reports her foot was run over by a WC at her ALF); R knee extension 2/5, knee ext AROM -40*, limited by pain, ankle PF/DF trace also limited by pain in R foot LLE Deficits / Details: knee extension +3/5  Cervical / Trunk Assessment: Other exceptions  Communication   Communication: HOH  Cognition Arousal/Alertness: Awake/alert Behavior During Therapy: WFL for tasks assessed/performed Overall Cognitive Status: Within Functional Limits for tasks assessed                       General Comments      Exercises        Assessment/Plan    PT Assessment Patient needs continued PT services  PT Diagnosis Difficulty walking;Abnormality of gait;Generalized weakness;Acute pain   PT Problem List Decreased activity tolerance;Decreased strength;Decreased mobility;Decreased knowledge of precautions;Decreased knowledge of use of DME;Decreased balance;Pain  PT Treatment Interventions DME instruction;Gait training;Functional mobility training;Therapeutic activities;Therapeutic exercise;Patient/family education   PT Goals (Current goals can be found in the Care Plan section) Acute Rehab PT Goals Patient Stated Goal: to be able to walk PT Goal Formulation: With patient Time For Goal Achievement: 02/11/15 Potential to Achieve Goals: Good    Frequency Min 3X/week   Barriers to discharge        Co-evaluation               End of Session Equipment Utilized During Treatment: Gait belt Activity Tolerance: Patient limited by pain Patient left: in chair;with call bell/phone within reach;with chair alarm set Nurse Communication: Mobility status    Functional Assessment Tool Used: clinical judgement Functional Limitation: Mobility: Walking and moving around Mobility: Walking and Moving Around Current Status [phone]): At least 80 percent but less than 100 percent impaired, limited or restricted Mobility: Walking and Moving Around Goal Status [phone]): At least 40 percent but less than 60 percent impaired, limited or restricted    Time: 0930-1003 PT Time Calculation (min) (ACUTE ONLY): 33 min   Charges:   PT Evaluation $Initial PT Evaluation Tier I: 1 Procedure PT Treatments $Therapeutic Activity: 8-22 mins   PT G Codes:   PT G-Codes **NOT FOR INPATIENT CLASS** Functional Assessment Tool Used: clinical judgement Functional Limitation: Mobility: Walking and moving around Mobility: Walking and Moving Around Current Status [id number]): At least 80 percent but less than  100 percent impaired, limited or restricted Mobility: Walking and Moving Around Goal Status [phone]): At least 40 percent but less than 60 percent impaired, limited or restricted    Taylor Roth 01/28/2015, 10:15 AM [phone]

## 2015-01-28 NOTE — Progress Notes (Signed)
CSW spoke with [facility] ALF to determine if they felt that they can accept back at dc. They are not able to provide max assist and at this time it appears she is needing this due to her foot pain- noted PT recommendations for foot/ankle workup- Patient is an OBS so Medicare would not cover her going to SNF- will discuss with MD.  Full assessment to follow-  Eduard Clos, MSW, [place]

## 2015-01-28 NOTE — Progress Notes (Signed)
[name] PROGRESS NOTE  Taylor Roth [id number] DOB: [birth year] DOA: 01/26/2015 PCP: Pcp Not In System  Assessment/Plan: Fever, unspecified -Etiology unclear, UA/chest x-ray unremarkable clinically do not suspect meningitis or encephalitis -Suspect viral illness, Flu PCR negative -afebrile now, IV fluids -Supportive care,  -Blood and urine cultures negative -CK-WNL  Severe Hypokalemia -replaced -Bmet this evening and in am  Confusion/hallucinations -Suspect some metabolic encephalopathy related to fevers/lateral illness -No neck stiffness no photophobia or meningeal signs -improved   Anxiety/depression -She is on large amounts of anti-suppressants, benzos -We will discuss with family about cutting down some of these dosages, stopped ativan and Triazolam at night, takes chlorazepate BID   COPD (chronic obstructive pulmonary disease -Stable, nebs when necessary   CKD (chronic kidney disease), stage III -Stable  R hip pain/chronic -chronic for years per pt, given increase pain and recent falls -will check Xray  R ankle pain -ongoing for 3-75months, daughter reports that her foot was run over by a wheel chair ? 20months ago -pain off and on but ambulates intermittently since this. -check Xray, was unable to bear weight on this today  Weakness/Debility/Chronic pain -may need SNF , PT following but doesn't qualify for SNF per staff  DVt proph: lovenox  Code Status:DNR Family Communication: none at bedside, called and updated daughter [name] Disposition Plan: back to ALF with HH, to be determined, ? SNF     HPI/Subjective: Feels better today, ate a little, no fevers  Objective: Filed Vitals:   01/27/15 2138 01/28/15 0504  BP: 169/88 143/89  Pulse: 95 91  Temp: 97.8 F (36.6 C) 97.6 F (36.4 C)  Resp: 18 18    Intake/Output Summary (Last 24 hours) at 01/28/15 1408 Last data filed at 01/28/15 0700  Gross per 24 hour  Intake   1800 ml   Output      0 ml  Net   1800 ml   Filed Weights   01/26/15 1559 01/26/15 1929  Weight: 53.524 kg (118 lb) 51.8 kg (114 lb 3.2 oz)    Exam:   General:  AAOx3  Cardiovascular: S1S2/RRR  Respiratory: CTAB  Abdomen: soft, NT, BS present  Musculoskeletal: no edema c/c  Neuro: non focal   Data Reviewed: Basic Metabolic Panel:  Recent Labs Lab 01/26/15 1547 01/27/15 0420 01/28/15 0508  NA 143 144 143  K 2.7* 2.6* 3.2*  CL 105 109 104  CO2 28 25 28   GLUCOSE 103* 97 101*  BUN 21* 13 7  CREATININE 0.67 0.52 0.59  CALCIUM 9.3 8.6* 9.0   Liver Function Tests:  Recent Labs Lab 01/26/15 1547 01/27/15 0420  AST 22 23  ALT 14 15  ALKPHOS 67 59  BILITOT 0.5 0.6  PROT 7.3 6.3*  ALBUMIN 3.2* 2.6*    Recent Labs Lab 01/26/15 1547  LIPASE 21   No results for input(s): AMMONIA in the last 168 hours. CBC:  Recent Labs Lab 01/26/15 1547 01/27/15 0420 01/28/15 0508  WBC 8.2 7.6 7.1  NEUTROABS 5.8  --   --   HGB 12.2 11.0* 11.8*  HCT 39.4 35.1* 37.2  MCV 92.7 92.6 90.1  PLT 385 367 424*   Cardiac Enzymes:  Recent Labs Lab 01/26/15 1557  CKTOTAL 62   BNP (last 3 results) No results for input(s): BNP in the last 8760 hours.  ProBNP (last 3 results) No results for input(s): PROBNP in the last 8760 hours.  CBG: No results for input(s): GLUCAP in the last 168 hours.  Recent Results (  from the past 240 hour(s))  Culture, blood (routine x 2)     Status: None (Preliminary result)   Collection Time: 01/26/15  3:45 PM  Result Value Ref Range Status   Specimen Description BLOOD RIGHT ANTECUBITAL  Final   Special Requests BOTTLES DRAWN AEROBIC AND ANAEROBIC 5 CC EA  Final   Culture   Final    NO GROWTH 2 DAYS Performed at [facility]    Report Status PENDING  Incomplete  Culture, blood (routine x 2)     Status: None (Preliminary result)   Collection Time: 01/26/15  3:47 PM  Result Value Ref Range Status   Specimen Description BLOOD LEFT HAND   Final   Special Requests BOTTLES DRAWN AEROBIC ONLY  5 CC  Final   Culture   Final    NO GROWTH 2 DAYS Performed at [facility]    Report Status PENDING  Incomplete  Urine culture     Status: None   Collection Time: 01/26/15  3:59 PM  Result Value Ref Range Status   Specimen Description URINE, CATHETERIZED  Final   Special Requests NONE  Final   Culture   Final    NO GROWTH 1 DAY Performed at [facility]    Report Status 01/27/2015 FINAL  Final     Studies: Dg Ankle 2 Views Right  February 24, 2015  CLINICAL DATA:  Ankle pain. No reported injury. Initial evaluation. EXAM: RIGHT ANKLE - 2 VIEW COMPARISON:  None. FINDINGS: Diffuse osteopenia. No acute bony or joint abnormality identified. No evidence of fracture or dislocation. IMPRESSION: Diffuse osteopenia.  No acute abnormality . Electronically Signed   By: Marcello Moores  Register   On: 02-24-15 14:01   Ct Head Wo Contrast  01/26/2015  CLINICAL DATA:  Three-day history of fever with confusion EXAM: CT HEAD WITHOUT CONTRAST TECHNIQUE: Contiguous axial images were obtained from the base of the skull through the vertex without intravenous contrast. COMPARISON:  September 09 2014 FINDINGS: Mild diffuse atrophy is stable. There is no intracranial mass, hemorrhage, extra-axial fluid collection, or midline shift. There is small vessel disease throughout much of the centra semiovale bilaterally, stable. No new gray-white compartment lesions are identified. No acute infarct is evident. The bony calvarium appears intact. The mastoid air cells are clear. No intraorbital lesions are identified. Patient has had cataract extractions bilaterally. IMPRESSION: Atrophy with periventricular vessel disease throughout much of the centra semiovale bilaterally. No acute infarct evident. No hemorrhage or mass effect. Electronically Signed   By: Lowella Grip III M.D.   On: 01/26/2015 16:27   Dg Chest Port 1 View  01/26/2015  CLINICAL DATA:  81 year old  female with fever confusion and weakness. Initial encounter. EXAM: PORTABLE CHEST 1 VIEW COMPARISON:  01/16/2015 and earlier. FINDINGS: Portable AP semi upright view at 1543 hours. Stable lung volumes. Increased interstitial opacity, but no overt edema or increased effusions. Small left pleural effusion appears stable. No pneumothorax or other new pulmonary opacity. Stable cardiac size and mediastinal contours. Stable visualized osseous structures. IMPRESSION: Increased pulmonary vascular congestion without overt edema. Stable small left pleural effusion. Electronically Signed   By: Genevie Ann M.D.   On: 01/26/2015 16:23   Dg Hip Unilat With Pelvis 2-3 Views Right  01/27/2015  CLINICAL DATA:  Increase in severity of chronic right hip pain with no known injury EXAM: DG HIP (WITH OR WITHOUT PELVIS) 2-3V RIGHT COMPARISON:  None. FINDINGS: Mild bilateral hip arthritis. Pelvic bones are intact. No evidence of right femur  fracture or dislocation. IMPRESSION: No acute findings Electronically Signed   By: Skipper Cliche M.D.   On: 01/27/2015 16:56    Scheduled Meds: . aspirin  81 mg Oral Daily  . clorazepate  7.5 mg Oral BID  . divalproex  250 mg Oral Daily  . docusate sodium  100 mg Oral BID  . dorzolamide-timolol  1 drop Both Eyes BID  . DULoxetine  30 mg Oral Daily  . enoxaparin (LOVENOX) injection  40 mg Subcutaneous Q24H  . famotidine  20 mg Oral Daily  . feeding supplement (ENSURE ENLIVE)  237 mL Oral BID BM  . fluconazole  100 mg Oral Daily  . fludrocortisone  0.1 mg Oral q morning - 10a  . latanoprost  1 drop Both Eyes QHS  . mirtazapine  45 mg Oral QHS  . pantoprazole  40 mg Oral Daily  . potassium chloride  40 mEq Oral Once  . QUEtiapine Fumarate  150 mg Oral Q2000   Continuous Infusions: . sodium chloride 75 mL/hr at 01/28/15 1306   Antibiotics Given (last 72 hours)    None      Principal Problem:   Fever, unspecified Active Problems:   Anxiety   COPD (chronic obstructive  pulmonary disease) (HCC)   Weakness   CKD (chronic kidney disease), stage III   Altered mental status   Fever    Time spent: 26min    Jernee Murtaugh  [name] Pager [phone]. If 7PM-7AM, please contact night-coverage at [email or web address], password [facility] 01/28/2015, 2:08 PM  LOS: 2 days

## 2015-01-29 ENCOUNTER — Observation Stay (HOSPITAL_COMMUNITY): Payer: Medicare Other

## 2015-01-29 DIAGNOSIS — R509 Fever, unspecified: Secondary | ICD-10-CM | POA: Diagnosis not present

## 2015-01-29 DIAGNOSIS — N183 Chronic kidney disease, stage 3 (moderate): Secondary | ICD-10-CM | POA: Diagnosis not present

## 2015-01-29 DIAGNOSIS — E440 Moderate protein-calorie malnutrition: Secondary | ICD-10-CM | POA: Diagnosis not present

## 2015-01-29 DIAGNOSIS — J069 Acute upper respiratory infection, unspecified: Secondary | ICD-10-CM | POA: Diagnosis not present

## 2015-01-29 DIAGNOSIS — J449 Chronic obstructive pulmonary disease, unspecified: Secondary | ICD-10-CM | POA: Diagnosis not present

## 2015-01-29 LAB — CBC
HEMATOCRIT: 37.2 % (ref 36.0–46.0)
HEMOGLOBIN: 11.4 g/dL — AB (ref 12.0–15.0)
MCH: 28.1 pg (ref 26.0–34.0)
MCHC: 30.6 g/dL (ref 30.0–36.0)
MCV: 91.6 fL (ref 78.0–100.0)
Platelets: 451 10*3/uL — ABNORMAL HIGH (ref 150–400)
RBC: 4.06 MIL/uL (ref 3.87–5.11)
RDW: 14.2 % (ref 11.5–15.5)
WBC: 6.1 10*3/uL (ref 4.0–10.5)

## 2015-01-29 LAB — BASIC METABOLIC PANEL
ANION GAP: 9 (ref 5–15)
BUN: 17 mg/dL (ref 6–20)
CHLORIDE: 109 mmol/L (ref 101–111)
CO2: 26 mmol/L (ref 22–32)
Calcium: 9.1 mg/dL (ref 8.9–10.3)
Creatinine, Ser: 0.72 mg/dL (ref 0.44–1.00)
GFR calc non Af Amer: >60 mL/min (ref >60)
Glucose, Bld: 103 mg/dL — ABNORMAL HIGH (ref 65–99)
Potassium: 4.1 mmol/L (ref 3.5–5.1)
Sodium: 144 mmol/L (ref 135–145)

## 2015-01-29 LAB — C DIFFICILE QUICK SCREEN W PCR REFLEX
C DIFFICILE (CDIFF) INTERP: NEGATIVE
C DIFFICILE (CDIFF) TOXIN: NEGATIVE
C Diff antigen: NEGATIVE

## 2015-01-29 MED ORDER — POTASSIUM CHLORIDE CRYS ER 20 MEQ PO TBCR
40.0000 meq | EXTENDED_RELEASE_TABLET | Freq: Every day | ORAL | Status: DC
  Administered 2015-01-30 – 2015-01-31 (×2): 40 meq via ORAL
  Filled 2015-01-29 (×2): qty 2

## 2015-01-29 MED ORDER — CLORAZEPATE DIPOTASSIUM 3.75 MG PO TABS
3.7500 mg | ORAL_TABLET | Freq: Two times a day (BID) | ORAL | Status: DC
  Administered 2015-01-29 – 2015-01-31 (×4): 3.75 mg via ORAL
  Filled 2015-01-29 (×4): qty 1

## 2015-01-29 NOTE — Progress Notes (Signed)
CSW called daughter to give her SNF bedf offers/updates as well as to advise her of probable dc tomorrow- she wants to ask the ALF if they will allow her mom to come back tomorrow with [facility] PT/OT and hired private duty (she has a friend who has just completed their BSN and is studying for exam/boards and can do private duty care for her mom). Daughter understands that she will need to select SNF bed for Sunday dc if ALF will not allow return.   CSW covering tomorrow will follow up for final dc disposition.   Eduard Clos, MSW, [place]

## 2015-01-29 NOTE — Clinical Social Work Note (Signed)
Clinical Social Work Assessment  Patient Details  Name: Taylor Roth MRN: [id number] Date of Birth: 1928/03/19  Date of referral:  01/29/15               Reason for consult:  Facility Placement                Permission sought to share information with:  Family Supports Permission granted to share information::     Name::        Agency::  [name]- daughter  Relationship::     Contact Information:     Housing/Transportation Living arrangements for the past 2 months:  [place] of Information:  Adult Children Patient Interpreter Needed:    Criminal Activity/Legal Involvement Pertinent to Current Situation/Hospitalization:  No - Comment as needed Significant Relationships:  Adult Children, Community Support Lives with:  Facility Resident Do you feel safe going back to the place where you live?  No Need for family participation in patient care:  Yes (Comment)  Care giving concerns:  CSW spoke with daughter to advise of PT recommendations for SNF- she agrees to this plan.   Social Worker assessment / plan:  Patient admitted to the hospital from [place]- at this time she is unable to ambulate and thus cannot return to ALF- PT is also recommending SNF.  FL2 and PASARR to be completed for SNF search.  Employment status:  Retired Forensic scientist:  Information systems manager, Medicaid In [place] PT Recommendations:  [place] / Referral to community resources:  [place]  Patient/Family's Response to care:  Daughter concerned about her knee being swollen- will advise MD of her concerns. Otherwise, daughter is agreeable to the care and plans.  Patient/Family's Understanding of and Emotional Response to Diagnosis, Current Treatment, and Prognosis:  Daughter understands and is eager to hear if an xray will be done for the knee- She also understands plans for SNF rehab at dc prior to returning to ALF if she is unable to bear weight  and walk.   Emotional Assessment Appearance:  Developmentally appropriate Attitude/Demeanor/Rapport:  Unable to Assess Affect (typically observed):  Unable to Assess Orientation:  Oriented to Self Alcohol / Substance use:    Psych involvement (Current and /or in the community):  No (Comment)  Discharge Needs  Concerns to be addressed:  Discharge Planning Concerns Readmission within the last 30 days:  No Current discharge risk:  None Barriers to Discharge:  No Barriers Identified   Ludwig Clarks, LCSW 01/29/2015, 11:01 AM

## 2015-01-29 NOTE — Progress Notes (Signed)
PT Cancellation Note  Patient Details Name: Taylor Roth MRN: [id number] DOB: 09/01/1928   Cancelled Treatment:     Attempted to work with pt twice today. Pt refused both times with time spent trying  To encourage pt to participate and even sit EOB. Pt stated , " don't you have other people to see".  Nursing stated they have gotten pt to [facility] today.    Clide Dales 01/29/2015, 5:04 PM  Clide Dales, PT Pager: [phone] 01/29/2015

## 2015-01-29 NOTE — Clinical Social Work Placement (Signed)
   CLINICAL SOCIAL WORK PLACEMENT  NOTE  Date:  01/29/2015  Patient Details  Name: CAMREN WAHLERT MRN: [id number] Date of Birth: 10-04-1928  Clinical Social Work is seeking post-discharge placement for this patient at the [place] level of care (*CSW will initial, date and re-position this form in  chart as items are completed):  Yes   Patient/family provided with [facility] Work Department's list of facilities offering this level of care within the geographic area requested by the patient (or if unable, by the patient's family).  Yes   Patient/family informed of their freedom to choose among providers that offer the needed level of care, that participate in Medicare, Medicaid or managed care program needed by the patient, have an available bed and are willing to accept the patient.  Yes   Patient/family informed of [facility]'s ownership interest in [facility] and [facility] as well as of the fact that they are under no obligation to receive care at these facilities.  PASRR submitted to EDS on 01/29/15     PASRR number received on 01/29/15     Existing PASRR number confirmed on       FL2 transmitted to all facilities in geographic area requested by pt/family on 01/29/15     FL2 transmitted to all facilities within larger geographic area on       Patient informed that his/her managed care company has contracts with or will negotiate with certain facilities, including the following:            Patient/family informed of bed offers received.  Patient chooses bed at       Physician recommends and patient chooses bed at      Patient to be transferred to   on  .  Patient to be transferred to facility by       Patient family notified on   of transfer.  Name of family member notified:        PHYSICIAN Please sign FL2, Please prepare priority discharge summary, including medications     Additional Comment:     _______________________________________________ Ludwig Clarks, LCSW 01/29/2015, 11:18 AM

## 2015-01-29 NOTE — NC FL2 (Signed)
[place] LEVEL OF CARE SCREENING TOOL     IDENTIFICATION  Patient Name: Taylor Roth Birthdate: [birth year] Sex: female Admission Date (Current Location): 01/26/2015  [facility] and [place] Number: Herbalist and Address:  [facility],  [place] [address] [place]      Provider Number: [id number]  Attending Physician Name and Address:  Domenic Polite, MD  Relative Name and Phone Number:       Current Level of Care: Hospital Recommended Level of Care: [place] Prior Approval Number:    Date Approved/Denied:   PASRR Number:    Discharge Plan: SNF    Current Diagnoses: Patient Active Problem List   Diagnosis Date Noted  . Altered mental status 01/26/2015  . Fever 01/26/2015  . Nausea and vomiting 01/17/2015  . Esophageal dysmotilities   . Fever, unspecified   . Metabolic encephalopathy   . Urinary tract infectious disease   . Aspiration pneumonia ([place]) 01/16/2015  . Weakness 01/14/2015  . Schatzki's ring 01/14/2015  . CKD (chronic kidney disease), stage III 01/14/2015  . Esophagus disorder   . Dysphagia 01/11/2015  . UTI (lower urinary tract infection) 03/30/2014  . Hearing loss, sensorineural 03/04/2014  . Nasal lesion 03/04/2014  . Unspecified constipation 12/12/2012  . Hypokalemia 12/11/2012  . Orthostatic hypotension 12/11/2012  . Arterial tortuosity (aorta) 12/11/2012  . Protein-calorie malnutrition, severe ([place]) 12/10/2012  . Decreased rectal sphincter tone 11/21/2012  . Idiopathic scoliosis 07/08/2012  . Macular degeneration 06/26/2011  . Vision disturbance 06/26/2011  . Glaucoma 06/26/2011  . Anemia 04/17/2011  . Drug-seeking behavior 02/26/2011  . Osteoporosis 02/26/2011  . Depression   . Anxiety   . COPD (chronic obstructive pulmonary disease) ([place])   . GERD (gastroesophageal reflux disease)   . Migraines   . Insomnia, unspecified     Orientation RESPIRATION BLADDER Height &  Weight    Self, Time, Place  Normal Continent 5\' 1"  (154.9 cm) 114 lbs.  BEHAVIORAL SYMPTOMS/MOOD NEUROLOGICAL BOWEL NUTRITION STATUS      Continent    AMBULATORY STATUS COMMUNICATION OF NEEDS Skin   Limited Assist Verbally Normal                       Personal Care Assistance Level of Assistance  Bathing, Dressing Bathing Assistance: Limited assistance Feeding assistance: Limited assistance Dressing Assistance: Limited assistance     Functional Limitations Info    Sight Info: Impaired        SPECIAL CARE FACTORS FREQUENCY                       Contractures      Additional Factors Info  Code Status Code Status Info: DNR             Current Medications (01/29/2015):  This is the current hospital active medication list Current Facility-Administered Medications  Medication Dose Route Frequency Provider Last Rate Last Dose  . acetaminophen (TYLENOL) tablet 650 mg  650 mg Oral Q6H PRN Domenic Polite, MD   650 mg at 01/28/15 2054  . albuterol (PROVENTIL) (2.5 MG/3ML) 0.083% nebulizer solution 2.5 mg  2.5 mg Inhalation BID PRN Domenic Polite, MD      . aspirin chewable tablet 81 mg  81 mg Oral Daily Domenic Polite, MD   81 mg at 01/28/15 1258  . budesonide (PULMICORT) nebulizer solution 0.25 mg  0.25 mg Nebulization BID PRN Domenic Polite, MD      .  clorazepate (TRANXENE) tablet 7.5 mg  7.5 mg Oral BID Domenic Polite, MD   7.5 mg at 01/28/15 2042  . diclofenac (FLECTOR) 1.3 % 1 patch  1 patch Transdermal Q12H PRN Domenic Polite, MD      . divalproex (DEPAKOTE) DR tablet 250 mg  250 mg Oral Daily Domenic Polite, MD   250 mg at 01/28/15 1258  . docusate sodium (COLACE) capsule 100 mg  100 mg Oral BID Domenic Polite, MD   100 mg at 01/28/15 2040  . dorzolamide-timolol (COSOPT) 22.3-6.8 MG/ML ophthalmic solution 1 drop  1 drop Both Eyes BID Domenic Polite, MD   1 drop at 01/28/15 2042  . DULoxetine (CYMBALTA) DR capsule 30 mg  30 mg Oral Daily Domenic Polite, MD    30 mg at 01/28/15 1258  . enoxaparin (LOVENOX) injection 40 mg  40 mg Subcutaneous Q24H Domenic Polite, MD   40 mg at 01/28/15 2042  . famotidine (PEPCID) tablet 20 mg  20 mg Oral Daily Domenic Polite, MD   20 mg at 01/28/15 1258  . feeding supplement (ENSURE ENLIVE) (ENSURE ENLIVE) liquid 237 mL  237 mL Oral BID BM Maricela Bo [place], RD   237 mL at 01/28/15 1257  . fluconazole (DIFLUCAN) tablet 100 mg  100 mg Oral Daily Domenic Polite, MD   100 mg at 01/28/15 1258  . fludrocortisone (FLORINEF) tablet 0.1 mg  0.1 mg Oral q morning - 10a Domenic Polite, MD   0.1 mg at 01/28/15 1258  . HYDROcodone-acetaminophen (NORCO/VICODIN) 5-325 MG per tablet 1-2 tablet  1-2 tablet Oral Q6H PRN Jeryl Columbia, NP   1 tablet at 01/28/15 1757  . hydrocortisone (ANUSOL-HC) 2.5 % rectal cream 1 application  1 application Rectal BID PRN Domenic Polite, MD      . latanoprost (XALATAN) 0.005 % ophthalmic solution 1 drop  1 drop Both Eyes QHS Domenic Polite, MD   1 drop at 01/28/15 2042  . mirtazapine (REMERON) tablet 45 mg  45 mg Oral QHS Domenic Polite, MD   45 mg at 01/28/15 2041  . nitroGLYCERIN (NITROSTAT) SL tablet 0.4 mg  0.4 mg Sublingual Q5 min PRN Domenic Polite, MD      . ondansetron [facility]) tablet 4 mg  4 mg Oral Q6H PRN Domenic Polite, MD       Or  . ondansetron [facility]) injection 4 mg  4 mg Intravenous Q6H PRN Domenic Polite, MD      . pantoprazole (PROTONIX) EC tablet 40 mg  40 mg Oral Daily Domenic Polite, MD   40 mg at 01/28/15 1257  . polyethylene glycol (MIRALAX / GLYCOLAX) packet 17 g  17 g Oral Daily PRN Domenic Polite, MD      . potassium chloride SA (K-DUR,KLOR-CON) CR tablet 40 mEq  40 mEq Oral BID Domenic Polite, MD   40 mEq at 01/28/15 2041  . QUEtiapine (SEROQUEL XR) 24 hr tablet 150 mg  150 mg Oral Q2000 Domenic Polite, MD   150 mg at 01/28/15 2042  . triamcinolone (NASACORT) nasal inhaler 2 spray  2 spray Nasal Daily PRN Domenic Polite, MD         Discharge Medications: Please see  discharge summary for a list of discharge medications.  Relevant Imaging Results:  Relevant Lab Results:   Additional Information SSN # [id number]  Ludwig Clarks, LCSW
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
## 2015-01-30 DIAGNOSIS — N183 Chronic kidney disease, stage 3 (moderate): Secondary | ICD-10-CM | POA: Diagnosis not present

## 2015-01-30 DIAGNOSIS — J069 Acute upper respiratory infection, unspecified: Secondary | ICD-10-CM | POA: Diagnosis not present

## 2015-01-30 DIAGNOSIS — E440 Moderate protein-calorie malnutrition: Secondary | ICD-10-CM | POA: Diagnosis not present

## 2015-01-30 DIAGNOSIS — R509 Fever, unspecified: Secondary | ICD-10-CM | POA: Diagnosis not present

## 2015-01-30 DIAGNOSIS — J449 Chronic obstructive pulmonary disease, unspecified: Secondary | ICD-10-CM | POA: Diagnosis not present

## 2015-01-30 LAB — BASIC METABOLIC PANEL
Anion gap: 10 (ref 5–15)
BUN: 19 mg/dL (ref 6–20)
CALCIUM: 9 mg/dL (ref 8.9–10.3)
CO2: 27 mmol/L (ref 22–32)
CREATININE: 0.64 mg/dL (ref 0.44–1.00)
Chloride: 105 mmol/L (ref 101–111)
GFR calc Af Amer: >60 mL/min (ref >60)
GLUCOSE: 108 mg/dL — AB (ref 65–99)
POTASSIUM: 3.9 mmol/L (ref 3.5–5.1)
SODIUM: 142 mmol/L (ref 135–145)

## 2015-01-30 LAB — MAGNESIUM: MAGNESIUM: 1.9 mg/dL (ref 1.7–2.4)

## 2015-01-30 MED ORDER — METHYLPREDNISOLONE ACETATE 40 MG/ML IJ SUSP
40.0000 mg | Freq: Once | INTRAMUSCULAR | Status: DC
  Filled 2015-01-30: qty 1

## 2015-01-30 MED ORDER — BUPIVACAINE HCL (PF) 0.25 % IJ SOLN
10.0000 mL | Freq: Once | INTRAMUSCULAR | Status: DC
  Filled 2015-01-30: qty 10

## 2015-01-30 MED ORDER — METOPROLOL TARTRATE 25 MG PO TABS
12.5000 mg | ORAL_TABLET | Freq: Two times a day (BID) | ORAL | Status: DC
  Administered 2015-01-30 – 2015-01-31 (×3): 12.5 mg via ORAL
  Filled 2015-01-30 (×3): qty 1

## 2015-01-30 MED ORDER — BUPIVACAINE HCL 0.25 % IJ SOLN
10.0000 mL | Freq: Once | INTRAMUSCULAR | Status: DC
  Filled 2015-01-30: qty 10

## 2015-01-30 NOTE — Procedures (Signed)
01/30/2015  12:47 PM  PATIENT:  Taylor Roth    PRE-PROCEDURE DIAGNOSIS: R knee DJD  POST-OPERATIVE DIAGNOSIS:  Same  PROCEDURE:  Intra-articular injection right knee  PROCEDURE DETAILS:  After informed verbal consent was obtained the superolateral portal was prepped with alcohol and the knee was aspirated, yielding a total of 35 ml and 4 ml of Marcaine and 1 ml of Depo-Medrol (40mg ) was injected. She tolerated this well and a Band-Aid was placed.
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
## 2015-01-30 NOTE — Progress Notes (Signed)
Pt dtr is agreeable to SNF at [facility]- she will complete paperwork at 2pm.  Per MD anticipate DC tomorrow (12/19)- signed DNR on chart  CSW will continue to follow  Domenica Reamer, [place] Worker [phone]

## 2015-01-30 NOTE — Consult Note (Signed)
ORTHOPAEDIC CONSULTATION  REQUESTING PHYSICIAN: Domenic Polite, MD  Chief Complaint: R knee pain  HPI: Taylor Roth is a 81 y.o. female who complains of chronic R knee pain.  Hx of DJD treated with corticosteroid injections.  Cannot recall time of last injection.  Believes to be "months ago".  Complains of increased swelling and pain in the R knee over the past few days.  Hx of multiple recent falls .  Denies numbness or tingling down the R leg.  Ambulates at baseline with walker.  Imaging of R knee reveal no acute fractures.    Past Medical History  Diagnosis Date  . Osteoporosis   . Depression   . Anxiety   . COPD (chronic obstructive pulmonary disease) ([place])   . GERD (gastroesophageal reflux disease)   . Abuse     benzos/narcotics  . Migraines   . Scoliosis   . Insomnia, unspecified   . Vitamin B deficiency   . Weight loss   . Chronic mental illness   . Arterial tortuosity (aorta) 12/11/2012  . Chronic kidney disease, stage IV (severe) ([place]) 12/11/2012  . Substance abuse     Benzos and Narcotics, she does not get any of those medicines from [facility], we have empahtically told her that.   . Vertigo   . Colitis   . Schatzki's ring 01/14/2015  . Dysphagia 01/11/2015  . Nasal bone fracture 03/30/2014   Past Surgical History  Procedure Laterality Date  . Cholecystectomy    . Partial hysterectomy    . Abdominal hysterectomy    . Cataract extraction    . Esophagogastroduodenoscopy (egd) with propofol N/A 01/13/2015    Procedure: ESOPHAGOGASTRODUODENOSCOPY (EGD) WITH PROPOFOL;  Surgeon: [name], MD;  Location: [facility];  Service: Endoscopy;  Laterality: N/A;   Social History   Social History  . Marital Status: Divorced    Spouse Name: N/A  . Number of Children: N/A  . Years of Education: N/A   Social History Main Topics  . Smoking status: Former Smoker -- 0.50 packs/day    Types: Cigarettes  . Smokeless tobacco: Never Used  . Alcohol Use: No  . Drug  Use: No  . Sexual Activity: No   Other Topics Concern  . None   Social History Narrative   Family History  Problem Relation Age of Onset  . Stroke Mother   . Diabetes Mother   . Cancer Father 77    esophageal  . Heart disease Mother   . Hearing loss Mother    Allergies  Allergen Reactions  . Macrobid [Nitrofurantoin Macrocrystal] Rash  . Azithromycin Other (See Comments)    unknown  . Doxycycline Other (See Comments)    unknown  . Escitalopram Oxalate Other (See Comments)    unknown  . Fioricet [Butalbital-Apap-Caffeine] Other (See Comments)    Drunk.  . Latex Other (See Comments)    unknown  . Levofloxacin Other (See Comments)    unknown  . Nsaids Other (See Comments)    Allergic reaction not listed on MAR.  Marland Kitchen Oxycodone Other (See Comments)    reaction to synthetic codeine  . Restasis [Cyclosporine] Other (See Comments)    unknown  . Sulfa Antibiotics Other (See Comments)    Reaction unknown  . Tylenol [Acetaminophen] Other (See Comments)    jaundice  . Biaxin [Clarithromycin] Rash    With burning sensation   Prior to Admission medications   Medication Sig Start Date End Date Taking? Authorizing Provider  acetaminophen (TYLENOL) 325 MG tablet Take 650 mg by mouth every 6 (six) hours as needed for headache.   Yes Historical Provider, MD  albuterol (PROVENTIL HFA;VENTOLIN HFA) 108 (90 BASE) MCG/ACT inhaler Inhale 1 puff into the lungs 2 (two) times daily as needed for wheezing or shortness of breath.   Yes Historical Provider, MD  aspirin 81 MG chewable tablet Chew 1 tablet (81 mg total) by mouth daily. 12/12/12  Yes [name], MD  Cholecalciferol (VITAMIN D) 1000 UNITS capsule Take 1,000 Units by mouth daily.   Yes Historical Provider, MD  clorazepate (TRANXENE) 7.5 MG tablet Take 7.5 mg by mouth 2 (two) times daily.  12/23/12  Yes Estill Dooms, MD  C[place]berry 425 MG CAPS Take 425 mg by mouth 2 (two) times daily.    Yes Historical Provider, MD  diclofenac  (FLECTOR) 1.3 % PTCH Place 1 patch onto the skin every 12 (twelve) hours as needed (For localized pain.).    Yes Historical Provider, MD  divalproex (DEPAKOTE) 250 MG DR tablet Take 250 mg by mouth daily.   Yes Historical Provider, MD  docusate sodium (COLACE) 100 MG capsule Take 100 mg by mouth 2 (two) times daily.   Yes Historical Provider, MD  dorzolamide-timolol (COSOPT) 22.3-6.8 MG/ML ophthalmic solution Place 1 drop into both eyes 2 (two) times daily.   Yes Historical Provider, MD  DULoxetine (CYMBALTA) 30 MG capsule Take 30 mg by mouth daily.   Yes Historical Provider, MD  fluconazole (DIFLUCAN) 100 MG tablet Take 100 mg by mouth daily.   Yes Historical Provider, MD  fludrocortisone (FLORINEF) 0.1 MG tablet Take 0.1 mg by mouth every morning.   Yes Historical Provider, MD  Ginkgo Biloba 120 MG CAPS Take 120 mg by mouth 2 (two) times daily.    Yes Historical Provider, MD  HYDROcodone-acetaminophen (NORCO) 10-325 MG tablet Take 1 tablet by mouth every 6 (six) hours as needed for severe pain. 01/19/15  Yes Verlee Monte, MD  LORazepam (ATIVAN) 0.5 MG tablet Take 1 tablet (0.5 mg total) by mouth daily. May also take a full tablet twice daily as needed for anxiety 01/19/15  Yes Verlee Monte, MD  meclizine (ANTIVERT) 25 MG tablet Take 25 mg by mouth 3 (three) times daily as needed for dizziness. Do not combine with Hydroxyzine or any other antihistamine. 11/12/13  Yes Thao P Le, DO  Melatonin 1 MG TABS Take 1 mg by mouth at bedtime.    Yes Historical Provider, MD  mineral oil-hydrophilic petrolatum (AQUAPHOR) ointment Apply 1 application topically daily.   Yes Historical Provider, MD  mirtazapine (REMERON) 45 MG tablet Take 45 mg by mouth at bedtime.   Yes Historical Provider, MD  omeprazole (PRILOSEC) 20 MG capsule Take 20 mg by mouth daily.   Yes Historical Provider, MD  QUEtiapine Fumarate (SEROQUEL XR) 150 MG 24 hr tablet Take 150 mg by mouth daily at 8 pm. Reported on 01/26/2015   Yes Historical  Provider, MD  ranitidine (ZANTAC) 150 MG tablet Take 150 mg by mouth 2 (two) times daily as needed for heartburn.   Yes Historical Provider, MD  Sennosides (SENNA LAX PO) Take 2 tablets by mouth 2 (two) times daily. Take 2 tablets in the morning and take 2 tablets at bedtime.   Yes Historical Provider, MD  travoprost, benzalkonium, (TRAVATAN) 0.004 % ophthalmic solution Place 1 drop into both eyes at bedtime.   Yes Historical Provider, MD  triazolam (HALCION) 0.25 MG tablet Take 0.25 mg by mouth at  bedtime.   Yes Historical Provider, MD  Acetic Acid (DOUCHE VINEGAR/WATER VA) Place 1 each around the anus once a week. To prevent UTI    Historical Provider, MD  amoxicillin-clavulanate (AUGMENTIN) 400-57 MG/5ML suspension Take 10 mLs (800 mg total) by mouth 2 (two) times daily. Patient not taking: Reported on 01/26/2015 01/19/15   Verlee Monte, MD  fluticasone (FLOVENT HFA) 110 MCG/ACT inhaler Inhale 1 puff into the lungs 2 (two) times daily as needed (shortness of breath.).    Historical Provider, MD  hydrocortisone (ANUSOL-HC) 2.5 % rectal cream Place 1 application rectally 2 (two) times daily as needed for hemorrhoids or itching.    Historical Provider, MD  nitroGLYCERIN (NITROSTAT) 0.4 MG SL tablet Place 0.4 mg under the tongue every 5 (five) minutes as needed for chest pain. May use 3 times    Historical Provider, MD  nystatin (MYCOSTATIN) 100000 UNIT/ML suspension Take 5 mLs by mouth 3 (three) times daily as needed (mouth pain).     Historical Provider, MD  ondansetron (ZOFRAN) 4 MG tablet Take 1 tablet (4 mg total) by mouth every 6 (six) hours. Patient taking differently: Take 4 mg by mouth every 4 (four) hours as needed for nausea or vomiting.  12/25/14   Hanna Patel-Mills, PA-C  pantoprazole (PROTONIX) 40 MG tablet Take 1 tablet (40 mg total) by mouth daily at 6 (six) AM. Patient not taking: Reported on 01/16/2015 01/14/15   [name], MD  polyethylene glycol (MIRALAX / GLYCOLAX) packet Take  17 g by mouth daily as needed for mild constipation or moderate constipation.     Historical Provider, MD  PRESCRIPTION MEDICATION Take 1 Container by mouth 2 (two) times daily between meals. Reported on 01/26/2015    Historical Provider, MD  triamcinolone (NASACORT ALLERGY 24HR) 55 MCG/ACT AERO nasal inhaler Place 2 sprays into the nose daily as needed (allergies).    Historical Provider, MD  triamcinolone cream (KENALOG) 0.1 % Apply 1 application topically 3 (three) times daily as needed (worsening symtoms then resume as needed).    Historical Provider, MD   Dg Ankle 2 Views Right  01/28/2015  CLINICAL DATA:  Ankle pain. No reported injury. Initial evaluation. EXAM: RIGHT ANKLE - 2 VIEW COMPARISON:  None. FINDINGS: Diffuse osteopenia. No acute bony or joint abnormality identified. No evidence of fracture or dislocation. IMPRESSION: Diffuse osteopenia.  No acute abnormality . Electronically Signed   By: Marcello Moores  Register   On: 01/28/2015 14:01   Dg Knee Right Port  01/29/2015  CLINICAL DATA:  Patient with right knee pain and swelling. Recent fall. Initial encounter. EXAM: PORTABLE RIGHT KNEE - 1-2 VIEW COMPARISON:  None. FINDINGS: Chondrocalcinosis. Normal anatomic alignment. No evidence for acute fracture or dislocation. Mild tricompartmental osteoarthritis. Large suprapatellar joint effusion. IMPRESSION: Large joint effusion. Chondrocalcinosis. No displaced fracture. Electronically Signed   By: Lovey Newcomer M.D.   On: 01/29/2015 11:53    Positive ROS: All other systems have been reviewed and were otherwise negative with the exception of those mentioned in the HPI and as above.  Labs cbc  Recent Labs  01/28/15 0508 01/29/15 0500  WBC 7.1 6.1  HGB 11.8* 11.4*  HCT 37.2 37.2  PLT 424* 451*    Labs inflam No results for input(s): CRP in the last 72 hours.  Invalid input(s): ESR  Labs coag No results for input(s): INR, PTT in the last 72 hours.  Invalid input(s): PT   Recent Labs   01/29/15 0500 01/30/15 1006  NA 144 142  K 4.1 3.9  CL 109 105  CO2 26 27  GLUCOSE 103* 108*  BUN 17 19  CREATININE 0.72 0.64  CALCIUM 9.1 9.0    Physical Exam: Filed Vitals:   01/30/15 0602 01/30/15 0954  BP: 161/78 158/92  Pulse: 83 77  Temp: 97.8 F (36.6 C) 98 F (36.7 C)  Resp: 18 18   General: Alert, no acute distress Cardiovascular: No pedal edema Respiratory: No cyanosis, no use of accessory musculature GI: No organomegaly, abdomen is soft and non-tender Skin: No lesions in the area of chief complaint other than those listed below in MSK exam.  Neurologic: Sensation intact distally Psychiatric: Patient is competent for consent with normal mood and affect Lymphatic: No axillary or cervical lymphadenopathy  MUSCULOSKELETAL:  R knee has moderate swelling.  No erythema or warmth.  Tender over MJL/LJL.  Limited ROM due to pain and swelling.  Sensation intact with 2+ distal pulses.  Other extremities are atraumatic with painless ROM and NVI.  Assessment: R knee pain in the setting of recent falls with hx of DJD.    Plan: Imaging negative for fracture.  Likely a flare in chronic DJD with recent falls.  Will aspirate at beside and inject with corticosteroid.  WBAT in the RLE.  Can follow up as outpatient at clinic if pain persists.   [facility] [phone]   01/30/2015 12:26 PM

## 2015-01-31 DIAGNOSIS — J069 Acute upper respiratory infection, unspecified: Secondary | ICD-10-CM | POA: Diagnosis not present

## 2015-01-31 DIAGNOSIS — M25571 Pain in right ankle and joints of right foot: Secondary | ICD-10-CM

## 2015-01-31 DIAGNOSIS — F419 Anxiety disorder, unspecified: Secondary | ICD-10-CM

## 2015-01-31 DIAGNOSIS — G9340 Encephalopathy, unspecified: Secondary | ICD-10-CM

## 2015-01-31 DIAGNOSIS — M25579 Pain in unspecified ankle and joints of unspecified foot: Secondary | ICD-10-CM | POA: Insufficient documentation

## 2015-01-31 DIAGNOSIS — N183 Chronic kidney disease, stage 3 (moderate): Secondary | ICD-10-CM | POA: Diagnosis not present

## 2015-01-31 DIAGNOSIS — R509 Fever, unspecified: Secondary | ICD-10-CM | POA: Diagnosis not present

## 2015-01-31 LAB — CULTURE, BLOOD (ROUTINE X 2)
CULTURE: NO GROWTH
Culture: NO GROWTH

## 2015-01-31 MED ORDER — HYDROCODONE-ACETAMINOPHEN 10-325 MG PO TABS: 1.0000 | ORAL_TABLET | Freq: Four times a day (QID) | ORAL | Status: DC | PRN

## 2015-01-31 MED ORDER — METOPROLOL TARTRATE 25 MG PO TABS: 12.5000 mg | ORAL_TABLET | Freq: Two times a day (BID) | ORAL | Status: DC

## 2015-01-31 MED ORDER — CLORAZEPATE DIPOTASSIUM 3.75 MG PO TABS: 3.7500 mg | ORAL_TABLET | Freq: Two times a day (BID) | ORAL | Status: DC

## 2015-01-31 MED ORDER — POTASSIUM CHLORIDE CRYS ER 20 MEQ PO TBCR: 40.0000 meq | EXTENDED_RELEASE_TABLET | Freq: Every day | ORAL | Status: DC

## 2015-01-31 NOTE — Progress Notes (Signed)
CSW sent d/c summary and FL2 to [facility] for d/c planning.   Facility is aware of Pt possible d/c. Pt's family will be completing paperwork at the facility today at 2 pm.    [facility]  [phone]

## 2015-01-31 NOTE — Progress Notes (Signed)
Called report to [facility] Dennie Fetters), awaiting transport. Rosine Beat, RN
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
## 2015-01-31 NOTE — Clinical Social Work Placement (Signed)
   CLINICAL SOCIAL WORK PLACEMENT  NOTE  Date:  01/31/2015  Patient Details  Name: Taylor Roth MRN: [id number] Date of Birth: Nov 19, 1928  Clinical Social Work is seeking post-discharge placement for this patient at the [place] level of care (*CSW will initial, date and re-position this form in  chart as items are completed):  Yes   Patient/family provided with [place] Work Department's list of facilities offering this level of care within the geographic area requested by the patient (or if unable, by the patient's family).  Yes   Patient/family informed of their freedom to choose among providers that offer the needed level of care, that participate in Medicare, Medicaid or managed care program needed by the patient, have an available bed and are willing to accept the patient.  Yes   Patient/family informed of 's ownership interest in [facility] and [facility], as well as of the fact that they are under no obligation to receive care at these facilities.  PASRR submitted to EDS on 01/29/15     PASRR number received on 01/29/15     Existing PASRR number confirmed on       FL2 transmitted to all facilities in geographic area requested by pt/family on 01/29/15     FL2 transmitted to all facilities within larger geographic area on       Patient informed that his/her managed care company has contracts with or will negotiate with certain facilities, including the following:            Patient/family informed of bed offers received.  Patient chooses bed at  ([facility])     Physician recommends and patient chooses bed at      Patient to be transferred to  ([facility] ) on 01/31/15.  Patient to be transferred to facility by  [facility]y [facility] )     Patient family notified on 01/31/15 of transfer.  Name of family member notified:  [name]      PHYSICIAN       Additional  Comment:    _______________________________________________ Pete Pelt 01/31/2015, 3:40 PM

## 2015-01-31 NOTE — Progress Notes (Signed)
PT Cancellation Note  Patient Details Name: Taylor Roth MRN: [id number] DOB: [birth year]   Cancelled Treatment:     D/C to SNF today   Nathanial Rancher 01/31/2015, 3:36 PM

## 2015-08-12 ENCOUNTER — Emergency Department (HOSPITAL_BASED_OUTPATIENT_CLINIC_OR_DEPARTMENT_OTHER)
Admission: EM | Admit: 2015-08-12 | Discharge: 2015-08-13 | Disposition: A | Discharge status: Home / Self Care | Payer: Medicare Other | Attending: Emergency Medicine | Admitting: Emergency Medicine

## 2015-08-12 ENCOUNTER — Emergency Department (HOSPITAL_BASED_OUTPATIENT_CLINIC_OR_DEPARTMENT_OTHER): Payer: Medicare Other

## 2015-08-12 ENCOUNTER — Encounter (HOSPITAL_BASED_OUTPATIENT_CLINIC_OR_DEPARTMENT_OTHER): Payer: Self-pay

## 2015-08-12 DIAGNOSIS — F329 Major depressive disorder, single episode, unspecified: Secondary | ICD-10-CM | POA: Insufficient documentation

## 2015-08-12 DIAGNOSIS — R109 Unspecified abdominal pain: Secondary | ICD-10-CM | POA: Insufficient documentation

## 2015-08-12 DIAGNOSIS — Z79899 Other long term (current) drug therapy: Secondary | ICD-10-CM | POA: Diagnosis not present

## 2015-08-12 DIAGNOSIS — Z7982 Long term (current) use of aspirin: Secondary | ICD-10-CM | POA: Insufficient documentation

## 2015-08-12 DIAGNOSIS — K5900 Constipation, unspecified: Secondary | ICD-10-CM | POA: Diagnosis present

## 2015-08-12 DIAGNOSIS — N184 Chronic kidney disease, stage 4 (severe): Secondary | ICD-10-CM | POA: Diagnosis not present

## 2015-08-12 DIAGNOSIS — Z87891 Personal history of nicotine dependence: Secondary | ICD-10-CM | POA: Diagnosis not present

## 2015-08-12 DIAGNOSIS — K5901 Slow transit constipation: Secondary | ICD-10-CM | POA: Diagnosis not present

## 2015-08-12 DIAGNOSIS — J449 Chronic obstructive pulmonary disease, unspecified: Secondary | ICD-10-CM | POA: Insufficient documentation

## 2015-08-12 LAB — URINE MICROSCOPIC-ADD ON: RBC / HPF: NONE SEEN RBC/hpf (ref 0–5)

## 2015-08-12 LAB — URINALYSIS, ROUTINE W REFLEX MICROSCOPIC
Bilirubin Urine: NEGATIVE
Glucose, UA: NEGATIVE mg/dL
Hgb urine dipstick: NEGATIVE
Ketones, ur: NEGATIVE mg/dL
NITRITE: NEGATIVE
PROTEIN: NEGATIVE mg/dL
SPECIFIC GRAVITY, URINE: 1.011 (ref 1.005–1.030)
pH: 6 (ref 5.0–8.0)

## 2015-08-12 MED ORDER — FLEET ENEMA 7-19 GM/118ML RE ENEM
1.0000 | ENEMA | Freq: Once | RECTAL | Status: AC
  Administered 2015-08-12: 1 via RECTAL
  Filled 2015-08-12: qty 1

## 2015-08-12 MED ORDER — LACTULOSE 10 GM/15ML PO SOLN: 10.0000 g | Freq: Every day | ORAL | Status: DC | PRN

## 2015-08-12 NOTE — ED Notes (Signed)
Per PT[facility] pt from [facility], per facility pt hasn't had a BM in 2 days, pt is currently on norco; abdomen non-tender and soft

## 2015-08-12 NOTE — ED Provider Notes (Signed)
CSN: [id number]     Arrival date & time 08/12/15  2144 History  By signing my name below, I, Taylor Roth, attest that this documentation has been prepared under the direction and in the presence of [name], MD . Electronically Signed: Higinio Roth, Scribe. 08/12/2015. 10:15 PM.  Chief Complaint  Patient presents with  . Constipation   The history is provided by the patient. No language interpreter was used.   HPI Comments: Taylor Roth is a 82 y.o. female with PMHx of colitis and COPD, who presents to the Emergency Department via EMS complaining of gradually worsening, constipation that began 08/08/15; she notes she has not had a BM in 4 days. Pt reports associated symptoms of abdominal pain. Pt notes she is currently taking Norco; she states she receives her medication through [facility] living facility. Pt states she has had plenty of enemas before. Pt denies nausea and vomiting.    Past Medical History  Diagnosis Date  . Osteoporosis   . Depression   . Anxiety   . COPD (chronic obstructive pulmonary disease) ([place])   . GERD (gastroesophageal reflux disease)   . Abuse     benzos/narcotics  . Migraines   . Scoliosis   . Insomnia, unspecified   . Vitamin B deficiency   . Weight loss   . Chronic mental illness   . Arterial tortuosity (aorta) 12/11/2012  . Chronic kidney disease, stage IV (severe) ([place]) 12/11/2012  . Substance abuse     Benzos and Narcotics, she does not get any of those medicines from [facility], we have empahtically told her that.   . Vertigo   . Colitis   . Schatzki's ring 01/14/2015  . Dysphagia 01/11/2015  . Nasal bone fracture 03/30/2014   Past Surgical History  Procedure Laterality Date  . Cholecystectomy    . Partial hysterectomy    . Abdominal hysterectomy    . Cataract extraction    . Esophagogastroduodenoscopy (egd) with propofol N/A 01/13/2015    Procedure: ESOPHAGOGASTRODUODENOSCOPY (EGD) WITH PROPOFOL;  Surgeon: [name], MD;   Location: [facility] ENDOSCOPY;  Service: Endoscopy;  Laterality: N/A;   Family History  Problem Relation Age of Onset  . Stroke Mother   . Diabetes Mother   . Cancer Father 26    esophageal  . Heart disease Mother   . Hearing loss Mother    Social History  Substance Use Topics  . Smoking status: Former Smoker -- 0.50 packs/day    Types: Cigarettes  . Smokeless tobacco: Never Used  . Alcohol Use: No   OB History    No data available     Review of Systems  Constitutional: Negative for fever.  Gastrointestinal: Positive for abdominal pain and constipation. Negative for nausea and vomiting.   Allergies  Macrobid; Azithromycin; Doxycycline; Escitalopram oxalate; Fioricet; Latex; Levofloxacin; Nsaids; Oxycodone; Restasis; Sulfa antibiotics; Tylenol; and Biaxin  Home Medications   Prior to Admission medications   Medication Sig Start Date End Date Taking? Authorizing Provider  acetaminophen (TYLENOL) 325 MG tablet Take 650 mg by mouth every 6 (six) hours as needed for headache.    Historical Provider, MD  Acetic Acid (DOUCHE VINEGAR/WATER VA) Place 1 each around the anus once a week. To prevent UTI    Historical Provider, MD  albuterol (PROVENTIL HFA;VENTOLIN HFA) 108 (90 BASE) MCG/ACT inhaler Inhale 1 puff into the lungs 2 (two) times daily as needed for wheezing or shortness of breath.    Historical Provider, MD  aspirin  81 MG chewable tablet Chew 1 tablet (81 mg total) by mouth daily. 12/12/12   [name], MD  Cholecalciferol (VITAMIN D) 1000 UNITS capsule Take 1,000 Units by mouth daily.    Historical Provider, MD  clorazepate (TRANXENE) 3.75 MG tablet Take 1 tablet (3.75 mg total) by mouth 2 (two) times daily. 01/31/15   Domenic Polite, MD  Cranberry 425 MG CAPS Take 425 mg by mouth 2 (two) times daily.     Historical Provider, MD  diclofenac (FLECTOR) 1.3 % PTCH Place 1 patch onto the skin every 12 (twelve) hours as needed (For localized pain.).     Historical Provider, MD   divalproex (DEPAKOTE) 250 MG DR tablet Take 250 mg by mouth daily.    Historical Provider, MD  docusate sodium (COLACE) 100 MG capsule Take 100 mg by mouth 2 (two) times daily.    Historical Provider, MD  dorzolamide-timolol (COSOPT) 22.3-6.8 MG/ML ophthalmic solution Place 1 drop into both eyes 2 (two) times daily.    Historical Provider, MD  fludrocortisone (FLORINEF) 0.1 MG tablet Take 0.1 mg by mouth every morning.    Historical Provider, MD  fluticasone (FLOVENT HFA) 110 MCG/ACT inhaler Inhale 1 puff into the lungs 2 (two) times daily as needed (shortness of breath.).    Historical Provider, MD  Ginkgo Biloba 120 MG CAPS Take 120 mg by mouth 2 (two) times daily.     Historical Provider, MD  HYDROcodone-acetaminophen (NORCO) 10-325 MG tablet Take 1 tablet by mouth every 6 (six) hours as needed for severe pain. 01/31/15   Domenic Polite, MD  hydrocortisone (ANUSOL-HC) 2.5 % rectal cream Place 1 application rectally 2 (two) times daily as needed for hemorrhoids or itching.    Historical Provider, MD  lactulose (CHRONULAC) 10 GM/15ML solution Take 15 mLs (10 g total) by mouth daily as needed for severe constipation. 08/12/15   [name], MD  Melatonin 1 MG TABS Take 1 mg by mouth at bedtime.     Historical Provider, MD  metoprolol tartrate (LOPRESSOR) 25 MG tablet Take 0.5 tablets (12.5 mg total) by mouth 2 (two) times daily. 01/31/15   Domenic Polite, MD  mineral oil-hydrophilic petrolatum (AQUAPHOR) ointment Apply 1 application topically daily.    Historical Provider, MD  mirtazapine (REMERON) 45 MG tablet Take 45 mg by mouth at bedtime.    Historical Provider, MD  nitroGLYCERIN (NITROSTAT) 0.4 MG SL tablet Place 0.4 mg under the tongue every 5 (five) minutes as needed for chest pain. May use 3 times    Historical Provider, MD  nystatin (MYCOSTATIN) 100000 UNIT/ML suspension Take 5 mLs by mouth 3 (three) times daily as needed (mouth pain).     Historical Provider, MD  omeprazole (PRILOSEC) 20  MG capsule Take 20 mg by mouth daily.    Historical Provider, MD  pantoprazole (PROTONIX) 40 MG tablet Take 1 tablet (40 mg total) by mouth daily at 6 (six) AM. Patient not taking: Reported on 01/16/2015 01/14/15   [name], MD  polyethylene glycol (MIRALAX / GLYCOLAX) packet Take 17 g by mouth daily as needed for mild constipation or moderate constipation.     Historical Provider, MD  potassium chloride SA (K-DUR,KLOR-CON) 20 MEQ tablet Take 2 tablets (40 mEq total) by mouth daily. 01/31/15   Domenic Polite, MD  QUEtiapine Fumarate (SEROQUEL XR) 150 MG 24 hr tablet Take 150 mg by mouth daily at 8 pm. Reported on 01/26/2015    Historical Provider, MD  ranitidine (ZANTAC) 150 MG tablet Take  150 mg by mouth 2 (two) times daily as needed for heartburn.    Historical Provider, MD  Sennosides (SENNA LAX PO) Take 2 tablets by mouth 2 (two) times daily. Take 2 tablets in the morning and take 2 tablets at bedtime.    Historical Provider, MD  travoprost, benzalkonium, (TRAVATAN) 0.004 % ophthalmic solution Place 1 drop into both eyes at bedtime.    Historical Provider, MD  triamcinolone (NASACORT ALLERGY 24HR) 55 MCG/ACT AERO nasal inhaler Place 2 sprays into the nose daily as needed (allergies).    Historical Provider, MD  triamcinolone cream (KENALOG) 0.1 % Apply 1 application topically 3 (three) times daily as needed (worsening symtoms then resume as needed).    Historical Provider, MD   BP 153/60 mmHg  Pulse 66  Temp(Src) 97.7 F (36.5 C) (Oral)  Resp 18  SpO2 96% Physical Exam  Constitutional: She is oriented to person, place, and time. She appears well-developed and well-nourished.  HENT:  Head: Normocephalic and atraumatic.  Eyes: Conjunctivae are normal. Right eye exhibits no discharge. Left eye exhibits no discharge.  Neck: Normal range of motion. Neck supple. No tracheal deviation present.  Cardiovascular: Normal rate and regular rhythm.   Pulmonary/Chest: Effort normal and breath  sounds normal.  Abdominal: Soft. She exhibits no distension. There is no tenderness. There is no guarding.  Decreased bowel sounds  Musculoskeletal: She exhibits edema.  1+ pitting edema to bilateral extremities  Neurological: She is alert and oriented to person, place, and time.  Skin: Skin is warm. No rash noted.  Psychiatric: She has a normal mood and affect.  Nursing note and vitals reviewed.   ED Course  Procedures  DIAGNOSTIC STUDIES:  Oxygen Saturation is 96% on RA, normal by my interpretation.    COORDINATION OF CARE:  9:55 PM Discussed treatment Roth, which includes X-ray abdomen with pt at bedside and pt agreed to Roth.  Labs Review Labs Reviewed  URINALYSIS, ROUTINE W REFLEX MICROSCOPIC (NOT AT [facility]) - Abnormal; Notable for the following:    Leukocytes, UA TRACE (*)    All other components within normal limits  URINE MICROSCOPIC-ADD ON - Abnormal; Notable for the following:    Squamous Epithelial / LPF 0-5 (*)    Bacteria, UA FEW (*)    All other components within normal limits    Imaging Review Dg Abd Acute W/chest  08/12/2015  CLINICAL DATA:  Abdominal pain. EXAM: DG ABDOMEN ACUTE W/ 1V CHEST COMPARISON:  01/26/2015 FINDINGS: There is a large volume colonic stool. The abdominal gas pattern is negative for obstruction or perforation. The upright view of the chest demonstrates unchanged marked hyperinflation but no acute cardiopulmonary abnormality. IMPRESSION: Large volume colonic stool. Negative for obstruction or perforation. Unchanged hyperinflation with no acute cardiopulmonary findings. Electronically Signed   By: Andreas Newport M.D.   On: 08/12/2015 22:36   I have personally reviewed and evaluated these images and lab results as part of my medical decision-making.   EKG Interpretation None      MDM  Pt did have a bm here with enema.  I will add lactulose to pt's many laxatives.  She is encouraged to hold norco. Final diagnoses:  Slow transit  constipation   I personally performed the services described in this documentation, which was scribed in my presence. The recorded information has been reviewed and is accurate.    [name], MD 08/12/15 [phone]

## 2015-08-12 NOTE — ED Notes (Signed)
PTAR notified for transport 

## 2015-08-12 NOTE — Discharge Instructions (Signed)
Constipation, Adult Constipation is when a person:  Poops (has a bowel movement) less than 3 times a week.  Has a hard time pooping.  Has poop that is dry, hard, or bigger than normal. HOME CARE   Eat foods with a lot of fiber in them. This includes fruits, vegetables, beans, and whole grains such as brown rice.  Avoid fatty foods and foods with a lot of sugar. This includes french fries, hamburgers, cookies, candy, and soda.  If you are not getting enough fiber from food, take products with added fiber in them (supplements).  Drink enough fluid to keep your pee (urine) clear or pale yellow.  Exercise on a regular basis, or as told by your doctor.  Go to the restroom when you feel like you need to poop. Do not hold it.  Only take medicine as told by your doctor. Do not take medicines that help you poop (laxatives) without talking to your doctor first. GET HELP RIGHT AWAY IF:   You have bright red blood in your poop (stool).  Your constipation lasts more than 4 days or gets worse.  You have belly (abdominal) or butt (rectal) pain.  You have thin poop (as thin as a pencil).  You lose weight, and it cannot be explained. MAKE SURE YOU:   Understand these instructions.  Will watch your condition.  Will get help right away if you are not doing well or get worse.   This information is not intended to replace advice given to you by your health care provider. Make sure you discuss any questions you have with your health care provider.   Document Released: 07/18/2007 Document Revised: 02/19/2014 Document Reviewed: 11/10/2012 Elsevier Interactive Patient Education 2016 [employer]  

## 2015-08-16 DIAGNOSIS — H548 Legal blindness, as defined in USA: Secondary | ICD-10-CM | POA: Insufficient documentation

## 2015-11-19 ENCOUNTER — Emergency Department (HOSPITAL_COMMUNITY)
Admission: EM | Admit: 2015-11-19 | Discharge: 2015-11-20 | Disposition: A | Discharge status: Home / Self Care | Payer: Medicare Other | Attending: Emergency Medicine | Admitting: Emergency Medicine

## 2015-11-19 ENCOUNTER — Encounter (HOSPITAL_COMMUNITY): Payer: Self-pay

## 2015-11-19 ENCOUNTER — Emergency Department (HOSPITAL_COMMUNITY): Payer: Medicare Other

## 2015-11-19 DIAGNOSIS — N184 Chronic kidney disease, stage 4 (severe): Secondary | ICD-10-CM | POA: Diagnosis not present

## 2015-11-19 DIAGNOSIS — Y999 Unspecified external cause status: Secondary | ICD-10-CM | POA: Insufficient documentation

## 2015-11-19 DIAGNOSIS — Z87891 Personal history of nicotine dependence: Secondary | ICD-10-CM | POA: Diagnosis not present

## 2015-11-19 DIAGNOSIS — J449 Chronic obstructive pulmonary disease, unspecified: Secondary | ICD-10-CM | POA: Insufficient documentation

## 2015-11-19 DIAGNOSIS — Z91040 Latex allergy status: Secondary | ICD-10-CM | POA: Insufficient documentation

## 2015-11-19 DIAGNOSIS — M542 Cervicalgia: Secondary | ICD-10-CM | POA: Diagnosis not present

## 2015-11-19 DIAGNOSIS — W1839XA Other fall on same level, initial encounter: Secondary | ICD-10-CM | POA: Diagnosis not present

## 2015-11-19 DIAGNOSIS — Z7982 Long term (current) use of aspirin: Secondary | ICD-10-CM | POA: Insufficient documentation

## 2015-11-19 DIAGNOSIS — Y92129 Unspecified place in nursing home as the place of occurrence of the external cause: Secondary | ICD-10-CM | POA: Diagnosis not present

## 2015-11-19 DIAGNOSIS — Y9301 Activity, walking, marching and hiking: Secondary | ICD-10-CM | POA: Diagnosis not present

## 2015-11-19 DIAGNOSIS — Z79899 Other long term (current) drug therapy: Secondary | ICD-10-CM | POA: Diagnosis not present

## 2015-11-19 DIAGNOSIS — W19XXXA Unspecified fall, initial encounter: Secondary | ICD-10-CM

## 2015-11-19 NOTE — ED Notes (Signed)
Wound care completed on patient right hip.

## 2015-11-19 NOTE — ED Notes (Signed)
[facility] called for transport.  

## 2015-11-19 NOTE — ED Notes (Signed)
Report called to [facility].  Discharge instructions reviewed.

## 2015-11-19 NOTE — ED Triage Notes (Signed)
Patient bib [facility] from [facility] with c/o fall from a standing position.  Per EMS patient was attempting to reach her walker to go to the restroom and fell up against the wall.  Per EMS denies LOC and denies blood thinners.  Patient c/o neck pain when she turns her head.  EMS states that she does not have pain on palpation.

## 2015-11-19 NOTE — ED Notes (Signed)
Bed: WA08 Expected date:  Expected time:  Means of arrival:  Comments: 82 yo F  Fall

## 2015-11-19 NOTE — Discharge Instructions (Signed)
The xray of your neck did not show any fractures.

## 2015-11-19 NOTE — ED Provider Notes (Signed)
[facility] DEPT Provider Note   CSN: [id number] Arrival date & time: 11/19/15  2059     History   Chief Complaint Chief Complaint  Patient presents with  . Fall    HPI Taylor Roth is a 82 y.o. female.  Patient presents today via EMS after a fall that occurred at a [facility] just prior to arrival.  She states that ambulating without her walker and reached into the closet to grab a pillow and then lost her balance and fell to the ground and unto her back.  She denies hitting her head or LOC.  She denies any pain at this time.  She denies headache, nausea, vomiting, or vision changes.  She denies chest pain or SOB.  Denies dizziness. Denies back or extremity pain.  She has not taken any medication for pain prior to arrival.  She is not on any anticoagulants.      Past Medical History:  Diagnosis Date  . Abuse    benzos/narcotics  . Anxiety   . Arterial tortuosity (aorta) 12/11/2012  . Chronic kidney disease, stage IV (severe) ([place]) 12/11/2012  . Chronic mental illness   . Colitis   . COPD (chronic obstructive pulmonary disease) ([place])   . Depression   . Dysphagia 01/11/2015  . GERD (gastroesophageal reflux disease)   . Insomnia, unspecified   . Migraines   . Nasal bone fracture 03/30/2014  . Osteoporosis   . Schatzki's ring 01/14/2015  . Scoliosis   . Substance abuse    Benzos and Narcotics, she does not get any of those medicines from [facility], we have empahtically told her that.   . Vertigo   . Vitamin B deficiency   . Weight loss     Patient Active Problem List   Diagnosis Date Noted  . Ankle pain   . Encephalopathy   . Malnutrition of moderate degree 01/29/2015  . Altered mental status 01/26/2015  . Fever 01/26/2015  . Nausea and vomiting 01/17/2015  . Esophageal dysmotilities   . Fever, unspecified   . Metabolic encephalopathy   . Urinary tract infectious disease   . Aspiration pneumonia ([place]) 01/16/2015  . Weakness 01/14/2015  . Schatzki's ring  01/14/2015  . CKD (chronic kidney disease), stage III 01/14/2015  . Esophagus disorder   . Dysphagia 01/11/2015  . UTI (lower urinary tract infection) 03/30/2014  . Hearing loss, sensorineural 03/04/2014  . Nasal lesion 03/04/2014  . Unspecified constipation 12/12/2012  . Hypokalemia 12/11/2012  . Orthostatic hypotension 12/11/2012  . Arterial tortuosity (aorta) 12/11/2012  . Protein-calorie malnutrition, severe ([place]) 12/10/2012  . Decreased rectal sphincter tone 11/21/2012  . Idiopathic scoliosis 07/08/2012  . Macular degeneration 06/26/2011  . Vision disturbance 06/26/2011  . Glaucoma 06/26/2011  . Anemia 04/17/2011  . Drug-seeking behavior 02/26/2011  . Osteoporosis 02/26/2011  . Depression   . Anxiety   . COPD (chronic obstructive pulmonary disease) ([place])   . GERD (gastroesophageal reflux disease)   . Migraines   . Insomnia, unspecified     Past Surgical History:  Procedure Laterality Date  . ABDOMINAL HYSTERECTOMY    . CATARACT EXTRACTION    . CHOLECYSTECTOMY    . ESOPHAGOGASTRODUODENOSCOPY (EGD) WITH PROPOFOL N/A 01/13/2015   Procedure: ESOPHAGOGASTRODUODENOSCOPY (EGD) WITH PROPOFOL;  Surgeon: [name], MD;  Location: [facility] ENDOSCOPY;  Service: Endoscopy;  Laterality: N/A;  . PARTIAL HYSTERECTOMY      OB History    No data available       Home Medications  Prior to Admission medications   Medication Sig Start Date End Date Taking? Authorizing Provider  acetaminophen (TYLENOL) 325 MG tablet Take 650 mg by mouth every 6 (six) hours as needed for headache.    Historical Provider, MD  Acetic Acid (DOUCHE VINEGAR/WATER VA) Place 1 each around the anus once a week. To prevent UTI    Historical Provider, MD  albuterol (PROVENTIL HFA;VENTOLIN HFA) 108 (90 BASE) MCG/ACT inhaler Inhale 1 puff into the lungs 2 (two) times daily as needed for wheezing or shortness of breath.    Historical Provider, MD  aspirin 81 MG chewable tablet Chew 1 tablet (81 mg total) by mouth  daily. 12/12/12   [name], MD  Cholecalciferol (VITAMIN D) 1000 UNITS capsule Take 1,000 Units by mouth daily.    Historical Provider, MD  clorazepate (TRANXENE) 3.75 MG tablet Take 1 tablet (3.75 mg total) by mouth 2 (two) times daily. 01/31/15   Domenic Polite, MD  Cranberry 425 MG CAPS Take 425 mg by mouth 2 (two) times daily.     Historical Provider, MD  diclofenac (FLECTOR) 1.3 % PTCH Place 1 patch onto the skin every 12 (twelve) hours as needed (For localized pain.).     Historical Provider, MD  divalproex (DEPAKOTE) 250 MG DR tablet Take 250 mg by mouth daily.    Historical Provider, MD  docusate sodium (COLACE) 100 MG capsule Take 100 mg by mouth 2 (two) times daily.    Historical Provider, MD  dorzolamide-timolol (COSOPT) 22.3-6.8 MG/ML ophthalmic solution Place 1 drop into both eyes 2 (two) times daily.    Historical Provider, MD  fludrocortisone (FLORINEF) 0.1 MG tablet Take 0.1 mg by mouth every morning.    Historical Provider, MD  fluticasone (FLOVENT HFA) 110 MCG/ACT inhaler Inhale 1 puff into the lungs 2 (two) times daily as needed (shortness of breath.).    Historical Provider, MD  Ginkgo Biloba 120 MG CAPS Take 120 mg by mouth 2 (two) times daily.     Historical Provider, MD  HYDROcodone-acetaminophen (NORCO) 10-325 MG tablet Take 1 tablet by mouth every 6 (six) hours as needed for severe pain. 01/31/15   Domenic Polite, MD  hydrocortisone (ANUSOL-HC) 2.5 % rectal cream Place 1 application rectally 2 (two) times daily as needed for hemorrhoids or itching.    Historical Provider, MD  lactulose (CHRONULAC) 10 GM/15ML solution Take 15 mLs (10 g total) by mouth daily as needed for severe constipation. 08/12/15   [name], MD  Melatonin 1 MG TABS Take 1 mg by mouth at bedtime.     Historical Provider, MD  metoprolol tartrate (LOPRESSOR) 25 MG tablet Take 0.5 tablets (12.5 mg total) by mouth 2 (two) times daily. 01/31/15   Domenic Polite, MD  mineral oil-hydrophilic  petrolatum (AQUAPHOR) ointment Apply 1 application topically daily.    Historical Provider, MD  mirtazapine (REMERON) 45 MG tablet Take 45 mg by mouth at bedtime.    Historical Provider, MD  nitroGLYCERIN (NITROSTAT) 0.4 MG SL tablet Place 0.4 mg under the tongue every 5 (five) minutes as needed for chest pain. May use 3 times    Historical Provider, MD  nystatin (MYCOSTATIN) 100000 UNIT/ML suspension Take 5 mLs by mouth 3 (three) times daily as needed (mouth pain).     Historical Provider, MD  omeprazole (PRILOSEC) 20 MG capsule Take 20 mg by mouth daily.    Historical Provider, MD  pantoprazole (PROTONIX) 40 MG tablet Take 1 tablet (40 mg total) by mouth daily at 6 (six)  AM. Patient not taking: Reported on 01/16/2015 01/14/15   [name], MD  polyethylene glycol (MIRALAX / GLYCOLAX) packet Take 17 g by mouth daily as needed for mild constipation or moderate constipation.     Historical Provider, MD  potassium chloride SA (K-DUR,KLOR-CON) 20 MEQ tablet Take 2 tablets (40 mEq total) by mouth daily. 01/31/15   Domenic Polite, MD  QUEtiapine Fumarate (SEROQUEL XR) 150 MG 24 hr tablet Take 150 mg by mouth daily at 8 pm. Reported on 01/26/2015    Historical Provider, MD  ranitidine (ZANTAC) 150 MG tablet Take 150 mg by mouth 2 (two) times daily as needed for heartburn.    Historical Provider, MD  Sennosides (SENNA LAX PO) Take 2 tablets by mouth 2 (two) times daily. Take 2 tablets in the morning and take 2 tablets at bedtime.    Historical Provider, MD  travoprost, benzalkonium, (TRAVATAN) 0.004 % ophthalmic solution Place 1 drop into both eyes at bedtime.    Historical Provider, MD  triamcinolone (NASACORT ALLERGY 24HR) 55 MCG/ACT AERO nasal inhaler Place 2 sprays into the nose daily as needed (allergies).    Historical Provider, MD  triamcinolone cream (KENALOG) 0.1 % Apply 1 application topically 3 (three) times daily as needed (worsening symtoms then resume as needed).    Historical Provider, MD      Family History Family History  Problem Relation Age of Onset  . Stroke Mother   . Diabetes Mother   . Heart disease Mother   . Hearing loss Mother   . Cancer Father 28    esophageal    Social History Social History  Substance Use Topics  . Smoking status: Former Smoker    Packs/day: 0.50    Types: Cigarettes  . Smokeless tobacco: Never Used  . Alcohol use No     Allergies   Macrobid [nitrofurantoin macrocrystal]; Azithromycin; Doxycycline; Escitalopram oxalate; Fioricet [butalbital-apap-caffeine]; Latex; Levofloxacin; Nsaids; Oxycodone; Restasis [cyclosporine]; Sulfa antibiotics; Tylenol [acetaminophen]; and Biaxin [clarithromycin]   Review of Systems Review of Systems  All other systems reviewed and are negative.    Physical Exam Updated Vital Signs BP 181/90 (BP Location: Left Arm)   Pulse 69   Temp 98.1 F (36.7 C) (Oral)   Resp 18   SpO2 100%   Physical Exam  Constitutional: She appears well-developed and well-nourished.  HENT:  Head: Normocephalic and atraumatic.  No signs of head trauma  Eyes: EOM are normal. Pupils are equal, round, and reactive to light.  Neck: Normal range of motion. Neck supple.  Cardiovascular: Normal rate, regular rhythm and normal heart sounds.   Pulmonary/Chest: Effort normal and breath sounds normal.  Musculoskeletal: Normal range of motion.       Cervical back: She exhibits tenderness and bony tenderness. She exhibits normal range of motion, no swelling, no edema and no deformity.       Thoracic back: She exhibits normal range of motion, no tenderness, no bony tenderness, no swelling, no edema and no deformity.       Lumbar back: She exhibits normal range of motion, no tenderness, no bony tenderness, no swelling, no edema and no deformity.  Full ROM of upper and lower extremities without pain  Neurological: She is alert. She has normal strength. No cranial nerve deficit or sensory deficit.  Grip strength 5/5  bilaterally Distal sensation of both hands intact  Skin: Skin is warm and dry.  Psychiatric: She has a normal mood and affect.  Nursing note and vitals reviewed.  ED Treatments / Results  Labs (all labs ordered are listed, but only abnormal results are displayed) Labs Reviewed - No data to display  EKG  EKG Interpretation None       Radiology No results found.  Procedures Procedures (including critical care time)  Medications Ordered in ED Medications - No data to display   Initial Impression / Assessment and Plan / ED Course  I have reviewed the triage vital signs and the nursing notes.  Pertinent labs & imaging results that were available during my care of the patient were reviewed by me and considered in my medical decision making (see chart for details).  Clinical Course   Patient presents today after a fall that [facility] the [facility] just prior to arrival.  She reports that she was ambulating without her walker and fell when she went to reach for a pillow in the closet.  She denies hitting her head or LOC.  On exam, no signs of head injury. She is not on anticoagulants. She does have some tenderness to palpation of the cervical spine.  Xray of the cervical spine negative for acute findings.  Full ROM of all extremities.  Feel that the patient is stable for discharge.  Return precautions given.  Patient also discussed with Dr Venora Maples who also evaluated the patient and is in agreement with the plan.  Final Clinical Impressions(s) / ED Diagnoses   Final diagnoses:  None    New Prescriptions New Prescriptions   No medications on file     Hyman Bible, PA-C 11/19/15 [place], MD 11/20/15 [phone]

## 2015-11-20 NOTE — ED Notes (Signed)
Patient d/c'd back to facility.  Report given to [facility].  Caregiver verbalized understanding.

## 2015-11-23 ENCOUNTER — Emergency Department (HOSPITAL_COMMUNITY): Payer: Medicare Other

## 2015-11-23 ENCOUNTER — Emergency Department (HOSPITAL_COMMUNITY)
Admission: EM | Admit: 2015-11-23 | Discharge: 2015-11-23 | Disposition: A | Discharge status: Home / Self Care | Payer: Medicare Other | Attending: Emergency Medicine | Admitting: Emergency Medicine

## 2015-11-23 DIAGNOSIS — Y9301 Activity, walking, marching and hiking: Secondary | ICD-10-CM | POA: Diagnosis not present

## 2015-11-23 DIAGNOSIS — N184 Chronic kidney disease, stage 4 (severe): Secondary | ICD-10-CM | POA: Insufficient documentation

## 2015-11-23 DIAGNOSIS — W19XXXA Unspecified fall, initial encounter: Secondary | ICD-10-CM | POA: Insufficient documentation

## 2015-11-23 DIAGNOSIS — Y92129 Unspecified place in nursing home as the place of occurrence of the external cause: Secondary | ICD-10-CM | POA: Insufficient documentation

## 2015-11-23 DIAGNOSIS — G43909 Migraine, unspecified, not intractable, without status migrainosus: Secondary | ICD-10-CM | POA: Insufficient documentation

## 2015-11-23 DIAGNOSIS — Z91040 Latex allergy status: Secondary | ICD-10-CM | POA: Diagnosis not present

## 2015-11-23 DIAGNOSIS — Z87891 Personal history of nicotine dependence: Secondary | ICD-10-CM | POA: Diagnosis not present

## 2015-11-23 DIAGNOSIS — Z79899 Other long term (current) drug therapy: Secondary | ICD-10-CM | POA: Insufficient documentation

## 2015-11-23 DIAGNOSIS — Z7982 Long term (current) use of aspirin: Secondary | ICD-10-CM | POA: Insufficient documentation

## 2015-11-23 DIAGNOSIS — J449 Chronic obstructive pulmonary disease, unspecified: Secondary | ICD-10-CM | POA: Insufficient documentation

## 2015-11-23 DIAGNOSIS — Y999 Unspecified external cause status: Secondary | ICD-10-CM | POA: Diagnosis not present

## 2015-11-23 DIAGNOSIS — M542 Cervicalgia: Secondary | ICD-10-CM | POA: Diagnosis not present

## 2015-11-23 LAB — URINALYSIS, ROUTINE W REFLEX MICROSCOPIC
GLUCOSE, UA: NEGATIVE mg/dL
HGB URINE DIPSTICK: NEGATIVE
Ketones, ur: NEGATIVE mg/dL
Nitrite: NEGATIVE
PH: 6 (ref 5.0–8.0)
Protein, ur: NEGATIVE mg/dL
SPECIFIC GRAVITY, URINE: 1.026 (ref 1.005–1.030)

## 2015-11-23 LAB — CBC WITH DIFFERENTIAL/PLATELET
Basophils Absolute: 0 10*3/uL (ref 0.0–0.1)
Basophils Relative: 0 %
Eosinophils Absolute: 0.1 10*3/uL (ref 0.0–0.7)
Eosinophils Relative: 2 %
HEMATOCRIT: 37.9 % (ref 36.0–46.0)
HEMOGLOBIN: 12.2 g/dL (ref 12.0–15.0)
Lymphocytes Relative: 27 %
Lymphs Abs: 1.7 10*3/uL (ref 0.7–4.0)
MCH: 30 pg (ref 26.0–34.0)
MCHC: 32.2 g/dL (ref 30.0–36.0)
MCV: 93.1 fL (ref 78.0–100.0)
MONO ABS: 0.9 10*3/uL (ref 0.1–1.0)
Monocytes Relative: 14 %
NEUTROS ABS: 3.6 10*3/uL (ref 1.7–7.7)
NEUTROS PCT: 57 %
Platelets: 246 10*3/uL (ref 150–400)
RBC: 4.07 MIL/uL (ref 3.87–5.11)
RDW: 14.1 % (ref 11.5–15.5)
WBC: 6.3 10*3/uL (ref 4.0–10.5)

## 2015-11-23 LAB — BASIC METABOLIC PANEL
Anion gap: 7 (ref 5–15)
BUN: 28 mg/dL — AB (ref 6–20)
CALCIUM: 9 mg/dL (ref 8.9–10.3)
CHLORIDE: 110 mmol/L (ref 101–111)
CO2: 27 mmol/L (ref 22–32)
CREATININE: 0.93 mg/dL (ref 0.44–1.00)
GFR calc Af Amer: >60 mL/min (ref >60)
GFR calc non Af Amer: 54 mL/min — ABNORMAL LOW (ref >60)
GLUCOSE: 126 mg/dL — AB (ref 65–99)
Potassium: 4.6 mmol/L (ref 3.5–5.1)
Sodium: 144 mmol/L (ref 135–145)

## 2015-11-23 LAB — URINE MICROSCOPIC-ADD ON: RBC / HPF: NONE SEEN RBC/hpf (ref 0–5)

## 2015-11-23 LAB — MAGNESIUM: Magnesium: 2.1 mg/dL (ref 1.7–2.4)

## 2015-11-23 MED ORDER — FLUCONAZOLE 150 MG PO TABS: 150.0000 mg | ORAL_TABLET | Freq: Once | ORAL | 0 refills | Status: AC

## 2015-11-23 MED ORDER — FOSFOMYCIN TROMETHAMINE 3 G PO PACK
3.0000 g | PACK | Freq: Once | ORAL | Status: AC
  Administered 2015-11-23: 3 g via ORAL
  Filled 2015-11-23: qty 3

## 2015-11-23 NOTE — ED Notes (Signed)
[facility] called for transport.  

## 2015-11-23 NOTE — ED Notes (Signed)
Patient transported to X-ray 

## 2015-11-23 NOTE — ED Triage Notes (Signed)
Pt presents from [facility] after having a fall 4 days ago and a fall 2 days ago as well. C/O neck and back pain with tingling in her hands and fingers since those fall. Seen previously for the fall 4 days ago. Alert and oriented.

## 2015-11-23 NOTE — Discharge Instructions (Signed)
Follow up with neuro surgery for your neck pain for evaluation.  Return for sudden worsening of symptoms.

## 2015-11-23 NOTE — ED Provider Notes (Signed)
[facility] DEPT Provider Note   CSN: [id number] Arrival date & time: 11/23/15  1558     History   Chief Complaint Chief Complaint  Patient presents with  . Fall    HPI Taylor Roth is a 82 y.o. female.  82 yo F with a cc of multiple falls.  This happened about 4 days ago when she slipped on a hardwood floor.  Patient then fell out of bed a couple days ago.  Per nursing home complaining of paresthesias to bilateral upper extremities.  The patient currently denies any such symptom. She is having lower neck pain. She denies weakness to upper or lower extremities. She is concerned because the nursing home on the longer let her walk because they're concerned that she will fall down.    The history is provided by the patient.  Fall  This is a new problem. The current episode started less than 1 hour ago. The problem occurs constantly. The problem has not changed since onset.Pertinent negatives include no chest pain, no headaches and no shortness of breath. Nothing aggravates the symptoms. Nothing relieves the symptoms. She has tried nothing for the symptoms. The treatment provided no relief.    Past Medical History:  Diagnosis Date  . Abuse    benzos/narcotics  . Anxiety   . Arterial tortuosity (aorta) 12/11/2012  . Chronic kidney disease, stage IV (severe) ([place]) 12/11/2012  . Chronic mental illness   . Colitis   . COPD (chronic obstructive pulmonary disease) ([place])   . Depression   . Dysphagia 01/11/2015  . GERD (gastroesophageal reflux disease)   . Insomnia, unspecified   . Migraines   . Nasal bone fracture 03/30/2014  . Osteoporosis   . Schatzki's ring 01/14/2015  . Scoliosis   . Substance abuse    Benzos and Narcotics, she does not get any of those medicines from [facility], we have empahtically told her that.   . Vertigo   . Vitamin B deficiency   . Weight loss     Patient Active Problem List   Diagnosis Date Noted  . Ankle pain   . Encephalopathy   .  Malnutrition of moderate degree 01/29/2015  . Altered mental status 01/26/2015  . Fever 01/26/2015  . Nausea and vomiting 01/17/2015  . Esophageal dysmotilities   . Fever, unspecified   . Metabolic encephalopathy   . Urinary tract infectious disease   . Aspiration pneumonia ([place]) 01/16/2015  . Weakness 01/14/2015  . Schatzki's ring 01/14/2015  . CKD (chronic kidney disease), stage III 01/14/2015  . Esophagus disorder   . Dysphagia 01/11/2015  . UTI (lower urinary tract infection) 03/30/2014  . Hearing loss, sensorineural 03/04/2014  . Nasal lesion 03/04/2014  . Unspecified constipation 12/12/2012  . Hypokalemia 12/11/2012  . Orthostatic hypotension 12/11/2012  . Arterial tortuosity (aorta) 12/11/2012  . Protein-calorie malnutrition, severe ([place]) 12/10/2012  . Decreased rectal sphincter tone 11/21/2012  . Idiopathic scoliosis 07/08/2012  . Macular degeneration 06/26/2011  . Vision disturbance 06/26/2011  . Glaucoma 06/26/2011  . Anemia 04/17/2011  . Drug-seeking behavior 02/26/2011  . Osteoporosis 02/26/2011  . Depression   . Anxiety   . COPD (chronic obstructive pulmonary disease) ([place])   . GERD (gastroesophageal reflux disease)   . Migraines   . Insomnia, unspecified     Past Surgical History:  Procedure Laterality Date  . ABDOMINAL HYSTERECTOMY    . CATARACT EXTRACTION    . CHOLECYSTECTOMY    . ESOPHAGOGASTRODUODENOSCOPY (EGD) WITH PROPOFOL N/A 01/13/2015   Procedure:  ESOPHAGOGASTRODUODENOSCOPY (EGD) WITH PROPOFOL;  Surgeon: [name], MD;  Location: [facility] END[facility]COPY;  Service: Endoscopy;  Laterality: N/A;  . PARTIAL HYSTERECTOMY      OB History    No data available       Home Medications    Prior to Admission medications   Medication Sig Start Date End Date Taking? Authorizing Provider  acetaminophen (TYLENOL) 325 MG tablet Take 650 mg by mouth every 6 (six) hours as needed for headache.   Yes Historical Provider, MD  albuterol (PROVENTIL HFA;VENTOLIN HFA)  108 (90 BASE) MCG/ACT inhaler Inhale 1 puff into the lungs every 6 (six) hours as needed for shortness of breath.    Yes Historical Provider, MD  aspirin EC 81 MG tablet Take 81 mg by mouth daily with breakfast.   Yes Historical Provider, MD  Biotin 5000 MCG CAPS Take 1 capsule by mouth daily with breakfast.   Yes Historical Provider, MD  Cholecalciferol (VITAMIN D) 1000 UNITS capsule Take 1,000 Units by mouth daily with breakfast.    Yes Historical Provider, MD  clorazepate (TRANXENE) 7.5 MG tablet Take 7.5 mg by mouth 3 (three) times daily.   Yes Historical Provider, MD  Cranberry 425 MG CAPS Take 425 mg by mouth 2 (two) times daily.    Yes Historical Provider, MD  divalproex (DEPAKOTE ER) 500 MG 24 hr tablet Take 500 mg by mouth daily with breakfast.   Yes Historical Provider, MD  dorzolamide-timolol (COSOPT) 22.3-6.8 MG/ML ophthalmic solution Place 1 drop into both eyes 2 (two) times daily.   Yes Historical Provider, MD  fludrocortisone (FLORINEF) 0.1 MG tablet Take 0.1 mg by mouth every morning.   Yes Historical Provider, MD  HYDROcodone-acetaminophen (NORCO) 10-325 MG tablet Take 1 tablet by mouth every 6 (six) hours as needed for severe pain. Patient taking differently: Take 1 tablet by mouth 4 (four) times daily.  01/31/15  Yes Domenic Polite, MD  hydrocortisone (ANUSOL-HC) 2.5 % rectal cream Place 1 application rectally 2 (two) times daily as needed for hemorrhoids or itching.   Yes Historical Provider, MD  lactulose (CHRONULAC) 10 GM/15ML solution Take 15 mLs (10 g total) by mouth daily as needed for severe constipation. 08/12/15  Yes [name], MD  lidocaine (LIDODERM) 5 % Place 1 patch onto the skin every 12 (twelve) hours as needed (for pain). Remove & Discard patch within 12 hours or as directed by MD   Yes Historical Provider, MD  metoprolol tartrate (LOPRESSOR) 25 MG tablet Take 0.5 tablets (12.5 mg total) by mouth 2 (two) times daily. 01/31/15  Yes Domenic Polite, MD  mineral  oil-hydrophilic petrolatum (AQUAPHOR) ointment Apply 1 application topically daily.   Yes Historical Provider, MD  mirtazapine (REMERON) 45 MG tablet Take 45 mg by mouth at bedtime.   Yes Historical Provider, MD  nitroGLYCERIN (NITROSTAT) 0.4 MG SL tablet Place 0.4 mg under the tongue every 5 (five) minutes as needed for chest pain. May use 3 times   Yes Historical Provider, MD  nystatin (MYCOSTATIN) 100000 UNIT/ML suspension Take 5 mLs by mouth 3 (three) times daily as needed (mouth pain).    Yes Historical Provider, MD  pantoprazole (PROTONIX) 40 MG tablet Take 1 tablet (40 mg total) by mouth daily at 6 (six) AM. 01/14/15  Yes [name], MD  polyethylene glycol (MIRALAX / GLYCOLAX) packet Take 17 g by mouth daily.    Yes Historical Provider, MD  polyethylene glycol (MIRALAX / GLYCOLAX) packet Take 17 g by mouth 3 (three) times  daily as needed for mild constipation.   Yes Historical Provider, MD  potassium chloride SA (K-DUR,KLOR-CON) 20 MEQ tablet Take 2 tablets (40 mEq total) by mouth daily. 01/31/15  Yes Domenic Polite, MD  QUEtiapine Fumarate (SEROQUEL XR) 150 MG 24 hr tablet Take 150 mg by mouth at bedtime. Reported on 01/26/2015   Yes Historical Provider, MD  ranitidine (ZANTAC) 150 MG tablet Take 150 mg by mouth 2 (two) times daily as needed for heartburn.   Yes Historical Provider, MD  senna (SENOKOT) 8.6 MG tablet Take 2 tablets by mouth 2 (two) times daily as needed for constipation.   Yes Historical Provider, MD  travoprost, benzalkonium, (TRAVATAN) 0.004 % ophthalmic solution Place 1 drop into both eyes at bedtime.   Yes Historical Provider, MD  triamcinolone cream (KENALOG) 0.1 % Apply 1 application topically 3 (three) times daily as needed (worsening symtoms then resume as needed).   Yes Historical Provider, MD  Trolamine Salicylate (ASPERCREME) 10 % LOTN Apply 1 application topically 2 (two) times daily as needed (for painful areas).   Yes Historical Provider, MD  Acetic Acid  (DOUCHE VINEGAR/WATER VA) Place 1 each around the anus once a week. To prevent UTI    Historical Provider, MD  aspirin 81 MG chewable tablet Chew 1 tablet (81 mg total) by mouth daily. Patient not taking: Reported on 11/23/2015 12/12/12   [name], MD  clorazepate (TRANXENE) 3.75 MG tablet Take 1 tablet (3.75 mg total) by mouth 2 (two) times daily. Patient not taking: Reported on 11/23/2015 01/31/15   Domenic Polite, MD  diclofenac (FLECTOR) 1.3 % PTCH Place 1 patch onto the skin every 12 (twelve) hours as needed (For localized pain.).     Historical Provider, MD  docusate sodium (COLACE) 100 MG capsule Take 100 mg by mouth 2 (two) times daily.    Historical Provider, MD  fluticasone (FLOVENT HFA) 110 MCG/ACT inhaler Inhale 1 puff into the lungs 2 (two) times daily as needed (shortness of breath.).    Historical Provider, MD  Ginkgo Biloba 120 MG CAPS Take 120 mg by mouth 2 (two) times daily.     Historical Provider, MD  Melatonin 1 MG TABS Take 1 mg by mouth at bedtime.     Historical Provider, MD  omeprazole (PRILOSEC) 20 MG capsule Take 20 mg by mouth daily.    Historical Provider, MD    Family History Family History  Problem Relation Age of Onset  . Stroke Mother   . Diabetes Mother   . Heart disease Mother   . Hearing loss Mother   . Cancer Father 55    esophageal    Social History Social History  Substance Use Topics  . Smoking status: Former Smoker    Packs/day: 0.50    Types: Cigarettes  . Smokeless tobacco: Never Used  . Alcohol use No     Allergies   Macrobid [nitrofurantoin macrocrystal]; Azithromycin; Doxycycline; Escitalopram oxalate; Fioricet [butalbital-apap-caffeine]; Latex; Levofloxacin; Nsaids; Oxycodone; Restasis [cyclosporine]; Sulfa antibiotics; Tylenol [acetaminophen]; and Biaxin [clarithromycin]   Review of Systems Review of Systems  Constitutional: Negative for chills and fever.  HENT: Negative for congestion and rhinorrhea.   Eyes: Negative  for redness and visual disturbance.  Respiratory: Negative for shortness of breath and wheezing.   Cardiovascular: Negative for chest pain and palpitations.  Gastrointestinal: Negative for nausea and vomiting.  Genitourinary: Negative for dysuria and urgency.  Musculoskeletal: Positive for neck pain. Negative for arthralgias and myalgias.  Skin: Negative for pallor and wound.  Neurological: Negative for dizziness and headaches.     Physical Exam Updated Vital Signs BP 99/56 (BP Location: Left Arm)   Pulse 71   Temp 98.7 F (37.1 C) (Oral)   Resp 18   SpO2 93%   Physical Exam  Constitutional: She is oriented to person, place, and time. She appears well-developed and well-nourished. No distress.  HENT:  Head: Normocephalic and atraumatic.  Eyes: EOM are normal. Pupils are equal, round, and reactive to light.  Neck: Normal range of motion. Neck supple.  Cardiovascular: Normal rate and regular rhythm.  Exam reveals no gallop and no friction rub.   No murmur heard. Pulmonary/Chest: Effort normal. She has no wheezes. She has no rales.  Abdominal: Soft. She exhibits no distension and no mass. There is no tenderness. There is no guarding.  Musculoskeletal: She exhibits tenderness (ttp about the lower C spine, worst to paraspinal musculature). She exhibits no edema.  Neurological: She is alert and oriented to person, place, and time. GCS eye subscore is 4. GCS verbal subscore is 5. GCS motor subscore is 6.  Reflex Scores:      Tricep reflexes are 2+ on the right side and 2+ on the left side.      Bicep reflexes are 2+ on the right side and 2+ on the left side.      Brachioradialis reflexes are 2+ on the right side and 2+ on the left side.      Patellar reflexes are 2+ on the right side and 2+ on the left side.      Achilles reflexes are 2+ on the right side and 2+ on the left side. No noted clonus, patient with 5/5 muscle strength bilaterally upper and lower extremities.   Skin: Skin is  warm and dry. She is not diaphoretic.  Psychiatric: She has a normal mood and affect. Her behavior is normal.  Nursing note and vitals reviewed.    ED Treatments / Results  Labs (all labs ordered are listed, but only abnormal results are displayed) Labs Reviewed  BASIC METABOLIC PANEL - Abnormal; Notable for the following:       Result Value   Glucose, Bld 126 (*)    BUN 28 (*)    GFR calc non Af Amer 54 (*)    All other components within normal limits  URINALYSIS, ROUTINE W REFLEX MICROSCOPIC (NOT AT [facility]) - Abnormal; Notable for the following:    APPearance CLOUDY (*)    Bilirubin Urine SMALL (*)    Leukocytes, UA SMALL (*)    All other components within normal limits  URINE MICROSCOPIC-ADD ON - Abnormal; Notable for the following:    Squamous Epithelial / LPF 0-5 (*)    Bacteria, UA MANY (*)    All other components within normal limits  CBC WITH DIFFERENTIAL/PLATELET  MAGNESIUM    EKG  EKG Interpretation None       Radiology Ct Head Wo Contrast  Result Date: 11/23/2015 CLINICAL DATA:  Fall. Initial encounter. EXAM: CT HEAD WITHOUT CONTRAST CT CERVICAL SPINE WITHOUT CONTRAST TECHNIQUE: Multidetector CT imaging of the head and cervical spine was performed following the standard protocol without intravenous contrast. Multiplanar CT image reconstructions of the cervical spine were also generated. COMPARISON:  01/26/2015 FINDINGS: CT HEAD FINDINGS Brain: No evidence of acute infarction, hemorrhage, hydrocephalus, extra-axial collection or mass lesion/mass effect. Generalized atrophy. Mild for age chronic microvascular ischemic gliosis in the cerebral white matter. Small remote right cerebellar infarct Vascular: Atherosclerotic calcification. Skull: Scalp lacerations  or indentations without underlying fracture or opaque foreign body. Sinuses/Orbits: Bilateral cataract resection.  No acute finding. CT CERVICAL SPINE FINDINGS Alignment: Chronic C4-5 anterolisthesis, measuring 3 mm.  There is new kyphotic deformity at C5-6 as described below. Skull base and vertebrae: C3-4 and C5-6 anterior cervical discectomy with solid bony fusion. C6-7 non segmentation. Interval fracture through the fused C5-6 vertebrae with kyphotic deformity. Fracture is still seen across the posterior elements at the level of the facets and lamina. The fracture appears chronic, with healed appearance to the vertebral body fracture. No acute fracture is detected. Soft tissues and spinal canal: No prevertebral fluid or swelling. No visible canal hematoma. Disc levels: Diffuse disc and facet degeneration. Bulky transverse ligamentous thickening without foramen magnum stenosis. Upper chest: Pleural based scarring seen at the left apex. IMPRESSION: 1. No evidence of acute intracranial or cervical spine injury. 2. Interval but remote fracture across the C5-6 fused vertebral bodies and posterior elements with healed kyphotic deformity. Incomplete posterior element fracture lines are still visible. 3. Previous C3-4 and C5-6 ACDF. C6-7 non segmentation. Severe adjacent segment facet degeneration at C4-5 with chronic 3 mm anterolisthesis. Electronically Signed   By: Monte Fantasia M.D.   On: 11/23/2015 17:29   Ct Cervical Spine Wo Contrast  Result Date: 11/23/2015 CLINICAL DATA:  Fall. Initial encounter. EXAM: CT HEAD WITHOUT CONTRAST CT CERVICAL SPINE WITHOUT CONTRAST TECHNIQUE: Multidetector CT imaging of the head and cervical spine was performed following the standard protocol without intravenous contrast. Multiplanar CT image reconstructions of the cervical spine were also generated. COMPARISON:  01/26/2015 FINDINGS: CT HEAD FINDINGS Brain: No evidence of acute infarction, hemorrhage, hydrocephalus, extra-axial collection or mass lesion/mass effect. Generalized atrophy. Mild for age chronic microvascular ischemic gliosis in the cerebral white matter. Small remote right cerebellar infarct Vascular: Atherosclerotic  calcification. Skull: Scalp lacerations or indentations without underlying fracture or opaque foreign body. Sinuses/Orbits: Bilateral cataract resection.  No acute finding. CT CERVICAL SPINE FINDINGS Alignment: Chronic C4-5 anterolisthesis, measuring 3 mm. There is new kyphotic deformity at C5-6 as described below. Skull base and vertebrae: C3-4 and C5-6 anterior cervical discectomy with solid bony fusion. C6-7 non segmentation. Interval fracture through the fused C5-6 vertebrae with kyphotic deformity. Fracture is still seen across the posterior elements at the level of the facets and lamina. The fracture appears chronic, with healed appearance to the vertebral body fracture. No acute fracture is detected. Soft tissues and spinal canal: No prevertebral fluid or swelling. No visible canal hematoma. Disc levels: Diffuse disc and facet degeneration. Bulky transverse ligamentous thickening without foramen magnum stenosis. Upper chest: Pleural based scarring seen at the left apex. IMPRESSION: 1. No evidence of acute intracranial or cervical spine injury. 2. Interval but remote fracture across the C5-6 fused vertebral bodies and posterior elements with healed kyphotic deformity. Incomplete posterior element fracture lines are still visible. 3. Previous C3-4 and C5-6 ACDF. C6-7 non segmentation. Severe adjacent segment facet degeneration at C4-5 with chronic 3 mm anterolisthesis. Electronically Signed   By: Monte Fantasia M.D.   On: 11/23/2015 17:29    Procedures Procedures (including critical care time)  Medications Ordered in ED Medications  fosfomycin (MONUROL) packet 3 g (3 g Oral Given 11/23/15 1924)     Initial Impression / Assessment and Plan / ED Course  I have reviewed the triage vital signs and the nursing notes.  Pertinent labs & imaging results that were available during my care of the patient were reviewed by me and considered in my medical decision  making (see chart for details).  Clinical  Course    82 yo F With a chief complaint of neck pain. Per the nursing home they're concerned of paresthesias to bilateral upper extremity. Patient has intact pulse motor and sensation to bilateral upper and lower extremities. As patient is continuing to complain of C-spine tenderness will obtain a CT head and C-spine. Basic lab eval with multiple falls.   CT C spine with likely chronic findings.  Patient continues to deny symptoms, given follow up for neurosurgery as patient is likely not a good historian.   12:56 AM:  I have discussed the diagnosis/risks/treatment options with the patient and believe the pt to be eligible for discharge home to follow-up with Neurosurgery. We also discussed returning to the ED immediately if new or worsening sx occur. We discussed the sx which are most concerning (e.g., sudden worsening pain, fever, unable to move arms and legs) that necessitate immediate return. Medications administered to the patient during their visit and any new prescriptions provided to the patient are listed below.  Medications given during this visit Medications  fosfomycin (MONUROL) packet 3 g (3 g Oral Given 11/23/15 1924)     The patient appears reasonably screen and/or stabilized for discharge and I doubt any other medical condition or other [facility] requiring further screening, evaluation, or treatment in the ED at this time prior to discharge.      Final Clinical Impressions(s) / ED Diagnoses   Final diagnoses:  Neck pain    New Prescriptions Discharge Medication List as of 11/23/2015  7:06 PM       Deno Etienne, DO 11/24/15 1388

## 2015-11-23 NOTE — ED Notes (Signed)
Patient was alert, oriented and stable upon discharge. RN went over AVS and patient had no further questions.  

## 2015-11-23 NOTE — ED Notes (Signed)
Bed: [facility] Expected date:  Expected time:  Means of arrival:  Comments: EMS 82 yo, fall

## 2015-12-01 ENCOUNTER — Emergency Department (HOSPITAL_COMMUNITY)
Admission: EM | Admit: 2015-12-01 | Discharge: 2015-12-01 | Disposition: A | Discharge status: Home / Self Care | Payer: Medicare Other | Attending: Emergency Medicine | Admitting: Emergency Medicine

## 2015-12-01 ENCOUNTER — Encounter (HOSPITAL_COMMUNITY): Payer: Self-pay

## 2015-12-01 DIAGNOSIS — J449 Chronic obstructive pulmonary disease, unspecified: Secondary | ICD-10-CM | POA: Insufficient documentation

## 2015-12-01 DIAGNOSIS — Z7982 Long term (current) use of aspirin: Secondary | ICD-10-CM | POA: Insufficient documentation

## 2015-12-01 DIAGNOSIS — N184 Chronic kidney disease, stage 4 (severe): Secondary | ICD-10-CM | POA: Diagnosis not present

## 2015-12-01 DIAGNOSIS — Z91040 Latex allergy status: Secondary | ICD-10-CM | POA: Diagnosis not present

## 2015-12-01 DIAGNOSIS — L89223 Pressure ulcer of left hip, stage 3: Secondary | ICD-10-CM | POA: Diagnosis not present

## 2015-12-01 DIAGNOSIS — Z87891 Personal history of nicotine dependence: Secondary | ICD-10-CM | POA: Insufficient documentation

## 2015-12-01 LAB — CBC WITH DIFFERENTIAL/PLATELET
Basophils Absolute: 0 10*3/uL (ref 0.0–0.1)
Basophils Relative: 0 %
EOS ABS: 0.1 10*3/uL (ref 0.0–0.7)
Eosinophils Relative: 1 %
HEMATOCRIT: 40.2 % (ref 36.0–46.0)
HEMOGLOBIN: 13.1 g/dL (ref 12.0–15.0)
LYMPHS ABS: 1.4 10*3/uL (ref 0.7–4.0)
LYMPHS PCT: 19 %
MCH: 30.6 pg (ref 26.0–34.0)
MCHC: 32.6 g/dL (ref 30.0–36.0)
MCV: 93.9 fL (ref 78.0–100.0)
MONOS PCT: 10 %
Monocytes Absolute: 0.7 10*3/uL (ref 0.1–1.0)
NEUTROS PCT: 70 %
Neutro Abs: 5.1 10*3/uL (ref 1.7–7.7)
Platelets: 245 10*3/uL (ref 150–400)
RBC: 4.28 MIL/uL (ref 3.87–5.11)
RDW: 13.3 % (ref 11.5–15.5)
WBC: 7.3 10*3/uL (ref 4.0–10.5)

## 2015-12-01 LAB — BASIC METABOLIC PANEL
Anion gap: 9 (ref 5–15)
BUN: 25 mg/dL — AB (ref 6–20)
CHLORIDE: 104 mmol/L (ref 101–111)
CO2: 27 mmol/L (ref 22–32)
CREATININE: 0.79 mg/dL (ref 0.44–1.00)
Calcium: 9.1 mg/dL (ref 8.9–10.3)
GFR calc Af Amer: >60 mL/min (ref >60)
GFR calc non Af Amer: >60 mL/min (ref >60)
GLUCOSE: 98 mg/dL (ref 65–99)
POTASSIUM: 5.1 mmol/L (ref 3.5–5.1)
SODIUM: 140 mmol/L (ref 135–145)

## 2015-12-01 NOTE — ED Notes (Signed)
Spoke with Trilby Drummer, RN at [facility].  Gave update on patient condition and possible plan for dispo.  As reiterated by SW facility must take patient back and seek alternative placement if they are unable to care for the pressure ulcer.

## 2015-12-01 NOTE — ED Notes (Signed)
Daughter's contact information (call with updates):  [name] [phone]

## 2015-12-01 NOTE — ED Notes (Signed)
PA at bedside.

## 2015-12-01 NOTE — ED Notes (Signed)
Bed: [facility] Expected date:  Expected time:  Means of arrival:  Comments: 

## 2015-12-01 NOTE — Progress Notes (Signed)
Cm assisted pt to call her daughter [name] using another ED mobile phone  [name] spoke with CM she is in [place] and is returning today She informed pt of this also [name] stated the "assisted living facility called me this morning about eight o'clock" [name] reports she has two aunts in [place] and they are coming to visit pt also [name] informed pt of this

## 2015-12-01 NOTE — Progress Notes (Signed)
CM spoke with pt who was attempting to call her daughter unsuccessfully Pt able to correctly tell Cm her daughter's number [name] (Daughter) Maricela Curet (Daughter)[phone] Pt able to tell CM she also has a daughter in [place], no son no spouse and she is at holden height able to tell Cm she is at [facility] health hospital and told them to bring her to [place] long Pt unable to tell cm the date, year Oriented to person, place but not time or situation unable to tell cm why she is at [employer] use of walker States she has no w/c

## 2015-12-01 NOTE — ED Triage Notes (Signed)
Pt presents via EMS from [facility] with c/o pressure ulcer that the facility would like evaluated on the left hip. Facility reports that the ulcer is a stage 3 and that she also has one developing on the right hip as well. Pt is non-ambulatory at baseline. Pt has no other complaints aside from pain at the areas of the pressure ulcers. Pt was 92% on RA for EMS and was placed on 2L of O2 and is now 99% on 2L.

## 2015-12-01 NOTE — ED Notes (Signed)
Patient eating independently.

## 2015-12-01 NOTE — ED Notes (Signed)
Bed: [facility] Expected date:  Expected time:  Means of arrival:  Comments: EMS Nursing Home, ulcer

## 2015-12-01 NOTE — ED Notes (Signed)
Care management at bedside.

## 2015-12-01 NOTE — ED Provider Notes (Signed)
Patient is 82 yo F presenting from [facility] for evaluation of stage 3 decub ulcer. Assumed care for patient at shift change from [facility], who determined no evidence of infection. SW and CM consulted to assist with placement, who determined patient is already receiving home health services for her wound at [facility], but no PT. Home health orders placed for PT, and patient is stable for d/c back to [facility]. Attending physician, Dr. Brantley Stage, agreed with assessment and plan. Patient and family updated and agreeable to plan.   Rosilyn Mings II, PA 12/01/15 [place]ide Liu, MD 12/01/15 [phone]

## 2015-12-01 NOTE — Progress Notes (Addendum)
EDCM spoke to patient and her sisters at bedside.  Patient is from [place].  Patient's sister Arbie Cookey reports patient is already receiving home health services for her wound.  Unsure about physical therapy.  [facility] called [facility] [phone] and spoke to Levada Dy who reports patient's nurse Trilby Drummer is out "doing an assessment." Per Levada Dy, patient already has home health services for wound care, no physical therapy.  EDCM asked Levada Dy if it is acceptable to send home health orders to facility for the facility to arrange?  Levada Dy reports this is acceptable.  Discussed patient with Berline Lopes PA and unit RN.  No further EDCM needs at this time.  12/01/2015 A. Laurie Penado RNCM  [facility] called and spoke to patient's daughter [name].  EDCM informed patient's daughter that patient will be going back to [facility].  Informed [name] that home health orders have been placed for home health RN for wound care and PT.  EDCM made suggestion to add private duty nursing services at nursing facility.  Also had contact information for Ombudsman to provide patient's daughter.  EDCM placed on home health orders to please speak to Dr. Marjory Lies (patient's pcp at facility) to increase wound care at facility and suggested sending patient to out patient wound care clinic.  [name] reports the facility told her she would have to pay out of pocket for private nursing services.  She reports she has reported the facility twice.  [name] is aware that nursing facility social worker is working on getting patient into [facility].  EDCM suggested calling [facility] and having a care meeting with all staff involved with patient care so that everyone is on the same page and patient receives the best care possible.  Patient's daughter agreeable.  [name] was thankful for phone call.    [name] would like to be contacted when patient is being picked up in ED and transferred to [facility].  EDCM informed EDRN.  No further EDCM needs at this time.

## 2015-12-01 NOTE — ED Provider Notes (Signed)
[facility] DEPT Provider Note   CSN: [id number] Arrival date & time: 12/01/15  1043     History   Chief Complaint Chief Complaint  Patient presents with  . Pressure Ulcer    HPI Taylor Roth is a 82 y.o. female.  HPI Taylor Roth is a 82 y.o. female presenting from [facility] with [place] significant for anxiety, CKD, COPD, and colitis who presents for evaluation of skin ulcer on the left hip.  Patient is a poor historian and not able to tell me how long it has been there.  Non-ambulatory at baseline.  She denies pain elsewhere or any other complaints.  Spoke with facility supervisor, facility is not capable of stage 3 decub ulcer wound care and patient needs placement at higher care facility.  They were concerned for infection.   Past Medical History:  Diagnosis Date  . Abuse    benzos/narcotics  . Anxiety   . Arterial tortuosity (aorta) 12/11/2012  . Chronic kidney disease, stage IV (severe) ([place]) 12/11/2012  . Chronic mental illness   . Colitis   . COPD (chronic obstructive pulmonary disease) ([place])   . Depression   . Dysphagia 01/11/2015  . GERD (gastroesophageal reflux disease)   . Insomnia, unspecified   . Migraines   . Nasal bone fracture 03/30/2014  . Osteoporosis   . Schatzki's ring 01/14/2015  . Scoliosis   . Substance abuse    Benzos and Narcotics, she does not get any of those medicines from [facility], we have empahtically told her that.   . Vertigo   . Vitamin B deficiency   . Weight loss     Patient Active Problem List   Diagnosis Date Noted  . Ankle pain   . Encephalopathy   . Malnutrition of moderate degree 01/29/2015  . Altered mental status 01/26/2015  . Fever 01/26/2015  . Nausea and vomiting 01/17/2015  . Esophageal dysmotilities   . Fever, unspecified   . Metabolic encephalopathy   . Urinary tract infectious disease   . Aspiration pneumonia ([place]) 01/16/2015  . Weakness 01/14/2015  . Schatzki's ring 01/14/2015  . CKD (chronic  kidney disease), stage III 01/14/2015  . Esophagus disorder   . Dysphagia 01/11/2015  . UTI (lower urinary tract infection) 03/30/2014  . Hearing loss, sensorineural 03/04/2014  . Nasal lesion 03/04/2014  . Unspecified constipation 12/12/2012  . Hypokalemia 12/11/2012  . Orthostatic hypotension 12/11/2012  . Arterial tortuosity (aorta) 12/11/2012  . Protein-calorie malnutrition, severe ([place]) 12/10/2012  . Decreased rectal sphincter tone 11/21/2012  . Idiopathic scoliosis 07/08/2012  . Macular degeneration 06/26/2011  . Vision disturbance 06/26/2011  . Glaucoma 06/26/2011  . Anemia 04/17/2011  . Drug-seeking behavior 02/26/2011  . Osteoporosis 02/26/2011  . Depression   . Anxiety   . COPD (chronic obstructive pulmonary disease) ([place])   . GERD (gastroesophageal reflux disease)   . Migraines   . Insomnia, unspecified     Past Surgical History:  Procedure Laterality Date  . ABDOMINAL HYSTERECTOMY    . CATARACT EXTRACTION    . CHOLECYSTECTOMY    . ESOPHAGOGASTRODUODENOSCOPY (EGD) WITH PROPOFOL N/A 01/13/2015   Procedure: ESOPHAGOGASTRODUODENOSCOPY (EGD) WITH PROPOFOL;  Surgeon: [name], MD;  Location: [facility] ENDOSCOPY;  Service: Endoscopy;  Laterality: N/A;  . PARTIAL HYSTERECTOMY      OB History    No data available       Home Medications    Prior to Admission medications   Medication Sig Start Date End Date Taking? Authorizing Provider  acetaminophen (TYLENOL) 325 MG tablet Take 650 mg by mouth every 6 (six) hours as needed for headache.   Yes Historical Provider, MD  albuterol (PROVENTIL HFA;VENTOLIN HFA) 108 (90 BASE) MCG/ACT inhaler Inhale 1 puff into the lungs every 6 (six) hours as needed for shortness of breath.    Yes Historical Provider, MD  aspirin EC 81 MG tablet Take 81 mg by mouth daily.   Yes Historical Provider, MD  Biotin 5000 MCG CAPS Take 1 capsule by mouth daily with breakfast.   Yes Historical Provider, MD  cephALEXin (KEFLEX) 500 MG capsule Take  500 mg by mouth 3 (three) times daily.   Yes Historical Provider, MD  Cholecalciferol (VITAMIN D) 1000 UNITS capsule Take 1,000 Units by mouth daily with breakfast.    Yes Historical Provider, MD  clorazepate (TRANXENE) 7.5 MG tablet Take 7.5 mg by mouth 3 (three) times daily.   Yes Historical Provider, MD  Cranberry 425 MG CAPS Take 425 mg by mouth 2 (two) times daily.    Yes Historical Provider, MD  divalproex (DEPAKOTE ER) 500 MG 24 hr tablet Take 500 mg by mouth daily with breakfast.   Yes Historical Provider, MD  dorzolamide-timolol (COSOPT) 22.3-6.8 MG/ML ophthalmic solution Place 1 drop into both eyes 2 (two) times daily.   Yes Historical Provider, MD  fludrocortisone (FLORINEF) 0.1 MG tablet Take 0.1 mg by mouth every morning.   Yes Historical Provider, MD  HYDROcodone-acetaminophen (NORCO) 10-325 MG tablet Take 1 tablet by mouth every 6 (six) hours as needed for severe pain. Patient taking differently: Take 1 tablet by mouth 4 (four) times daily.  01/31/15  Yes Domenic Polite, MD  hydrocortisone (ANUSOL-HC) 2.5 % rectal cream Place 1 application rectally 2 (two) times daily as needed for hemorrhoids or itching.   Yes Historical Provider, MD  lactulose (CHRONULAC) 10 GM/15ML solution Take 15 mLs (10 g total) by mouth daily as needed for severe constipation. 08/12/15  Yes [name], MD  lidocaine (LIDODERM) 5 % Place 1 patch onto the skin every 12 (twelve) hours as needed (for pain). Remove & Discard patch within 12 hours or as directed by MD   Yes Historical Provider, MD  metoprolol tartrate (LOPRESSOR) 25 MG tablet Take 0.5 tablets (12.5 mg total) by mouth 2 (two) times daily. 01/31/15  Yes Domenic Polite, MD  mineral oil-hydrophilic petrolatum (AQUAPHOR) ointment Apply 1 application topically daily.   Yes Historical Provider, MD  mirtazapine (REMERON) 45 MG tablet Take 45 mg by mouth at bedtime.   Yes Historical Provider, MD  nitroGLYCERIN (NITROSTAT) 0.4 MG SL tablet Place 0.4 mg under  the tongue every 5 (five) minutes as needed for chest pain. May use 3 times   Yes Historical Provider, MD  nystatin (MYCOSTATIN) 100000 UNIT/ML suspension Take 5 mLs by mouth 3 (three) times daily as needed (mouth pain).    Yes Historical Provider, MD  pantoprazole (PROTONIX) 40 MG tablet Take 1 tablet (40 mg total) by mouth daily at 6 (six) AM. 01/14/15  Yes [name], MD  polyethylene glycol (MIRALAX / GLYCOLAX) packet Take 17 g by mouth daily.    Yes Historical Provider, MD  polyethylene glycol (MIRALAX / GLYCOLAX) packet Take 17 g by mouth 3 (three) times daily as needed for mild constipation.   Yes Historical Provider, MD  potassium chloride SA (K-DUR,KLOR-CON) 20 MEQ tablet Take 2 tablets (40 mEq total) by mouth daily. 01/31/15  Yes Domenic Polite, MD  QUEtiapine Fumarate (SEROQUEL XR) 150 MG 24 hr tablet  Take 150 mg by mouth at bedtime. Reported on 01/26/2015   Yes Historical Provider, MD  ranitidine (ZANTAC) 150 MG tablet Take 150 mg by mouth 2 (two) times daily as needed for heartburn.   Yes Historical Provider, MD  senna (SENOKOT) 8.6 MG tablet Take 2 tablets by mouth 2 (two) times daily as needed for constipation.   Yes Historical Provider, MD  travoprost, benzalkonium, (TRAVATAN) 0.004 % ophthalmic solution Place 1 drop into both eyes at bedtime.   Yes Historical Provider, MD  triamcinolone cream (KENALOG) 0.1 % Apply 1 application topically 3 (three) times daily as needed (worsening symtoms then resume as needed).   Yes Historical Provider, MD  Trolamine Salicylate (ASPERCREME) 10 % LOTN Apply 1 application topically 2 (two) times daily as needed (for painful areas).   Yes Historical Provider, MD  docusate sodium (COLACE) 100 MG capsule Take 100 mg by mouth 2 (two) times daily.    Historical Provider, MD    Family History Family History  Problem Relation Age of Onset  . Stroke Mother   . Diabetes Mother   . Heart disease Mother   . Hearing loss Mother   . Cancer Father 61     esophageal    Social History Social History  Substance Use Topics  . Smoking status: Former Smoker    Packs/day: 0.50    Types: Cigarettes  . Smokeless tobacco: Never Used  . Alcohol use No     Allergies   Macrobid [nitrofurantoin macrocrystal]; Azithromycin; Doxycycline; Escitalopram oxalate; Fioricet [butalbital-apap-caffeine]; Latex; Levofloxacin; Nsaids; Oxycodone; Restasis [cyclosporine]; Sulfa antibiotics; Tylenol [acetaminophen]; and Biaxin [clarithromycin]   Review of Systems Review of Systems  Constitutional: Negative for fever.  Skin: Positive for wound.  All other systems reviewed and are negative.    Physical Exam Updated Vital Signs BP (!) 135/54 (BP Location: Right Arm)   Pulse 77   Temp 97.7 F (36.5 C) (Oral)   Resp 18   SpO2 92%   Physical Exam  Constitutional: She is oriented to person, place, and time. She appears well-developed and well-nourished.  Elderly frail appearing female.   HENT:  Head: Normocephalic and atraumatic.  Mouth/Throat: Oropharynx is clear and moist.  Eyes: Conjunctivae are normal. Pupils are equal, round, and reactive to light.  Neck: Normal range of motion. Neck supple.  Cardiovascular: Normal rate and regular rhythm.   Pulmonary/Chest: Effort normal and breath sounds normal. No accessory muscle usage or stridor. No respiratory distress. She has no wheezes. She has no rhonchi. She has no rales.  Abdominal: Soft. Bowel sounds are normal. She exhibits no distension. There is no tenderness.  Musculoskeletal: Normal range of motion.  Lymphadenopathy:    She has no cervical adenopathy.  Neurological: She is alert and oriented to person, place, and time.  Speech clear without dysarthria.  Skin: Skin is warm and dry.  Approximately 5x3 cm stage 3 decubitus ulcer on the lateral left hip.  No obvious necrosis.  No sacral or lumbar lesions. Blanchable area of erythema approximately 3x1 cm on the lateral right hip.   Psychiatric: She  has a normal mood and affect. Her behavior is normal.     ED Treatments / Results  Labs (all labs ordered are listed, but only abnormal results are displayed) Labs Reviewed  BASIC METABOLIC PANEL - Abnormal; Notable for the following:       Result Value   BUN 25 (*)    All other components within normal limits  CBC WITH DIFFERENTIAL/PLATELET  EKG  EKG Interpretation None       Radiology No results found.  Procedures Procedures (including critical care time)  Medications Ordered in ED Medications - No data to display   Initial Impression / Assessment and Plan / ED Course  I have reviewed the triage vital signs and the nursing notes.  Pertinent labs & imaging results that were available during my care of the patient were reviewed by me and considered in my medical decision making (see chart for details).  Clinical Course   Patient sent from [facility] for concern for infection of left hip decubitus ulcer and placement for higher level of care.  VSS, NAD.  Afebrile.  Labs without acute abnormalities.  On exam, appears to be stage 3 decubitus ulcer to lateral left hip without obvious necrotic tissue.  Blanchable erythematous area to right lateral hip without skin breakdown.  No evidence of systemic infection.  No indication for emergent debridement at this time.  SW consulted to assist with placement for discharge.   Final Clinical Impressions(s) / ED Diagnoses   Final diagnoses:  Decubitus ulcer of left hip, stage 3 [facility])    New Prescriptions New Prescriptions   No medications on file         Gloriann Loan, PA-C 12/01/15 [place] Liu, MD 12/01/15 [phone]

## 2015-12-01 NOTE — Progress Notes (Signed)
Call received from [facility] regarding patient. She stated they are not allowed to care for patient with that type of wound by state regulations. CSW informed them that patient would need to return to [facility] and they would need to facilitate patient's higher level of care to another facility if patient is discharged. She stated she had made attempts to other places and she also wanted to know patients disposition. CSW informed her to call back and speak with the EDP or Nurse.     Staffed case with Nurse.   Genice Rouge [phone] ED CSW 12/01/2015 3:37 PM

## 2015-12-01 NOTE — Progress Notes (Addendum)
ED CM noted CM consult for Currently living at [facility], facility is not able to care for stage 3 decub ulcers and needs new placement. CM noted also consult for SW in EPIC Medicare/medicaid Pt with [facility] ED visits x 4 (08/12/15-constipation, 11/19/15 fall, 10/11/7 fall and today- ulcer) in last 6 months and no admissions Last 2 ED visits for falls and on 11/23/15 pt voiced concern that at baseline she used a walker but facility was not wanting her to walk to prevent further falls  Pending further evaluation

## 2015-12-01 NOTE — ED Notes (Signed)
Report called to facility

## 2015-12-01 NOTE — Progress Notes (Signed)
Staffed case with EDP and ED CM.  CSW spoke with patient at bedside with two sisters present, Billie Ruddy and Levin Bacon. Sisters stated they are from [name], [place] and [place], [place]. Patient reports in December, she will have been at [facility] for three years. CSW asked patient if she knew why she was in the hospital and she stated "got a lil cracking right there and they said they couldn't heal it". Patient reports "none of them worth a durn" (referring to staff at [facility]). Patient reports "sending nurses in and out surely someone could have done something". CSW asked patient if she wanted to provide anymore information and she stated "I'll stop right there".   Patient sister Arbie Cookey stated [facility] had arranged for patient to go to another facility when patient is discharged. She stated [facility] wanted patient to be seen in hospital before being sent to the other facility. The sister was unsure of the name of the facility. CSW left a message for someone in admissions at [facility] for return call to obtain clarity on this information from sister.   Genice Rouge [phone] ED CSW 12/01/2015 2:55 PM

## 2015-12-11 ENCOUNTER — Emergency Department (HOSPITAL_COMMUNITY): Payer: Medicare Other

## 2015-12-11 ENCOUNTER — Inpatient Hospital Stay (HOSPITAL_COMMUNITY)
Admission: EM | Admit: 2015-12-11 | Discharge: 2015-12-15 | DRG: 193 | Disposition: A | Discharge status: Skilled Nursing Facility | Payer: Medicare Other | Attending: Family Medicine | Admitting: Family Medicine

## 2015-12-11 ENCOUNTER — Encounter (HOSPITAL_COMMUNITY): Payer: Self-pay | Admitting: Emergency Medicine

## 2015-12-11 DIAGNOSIS — Z9849 Cataract extraction status, unspecified eye: Secondary | ICD-10-CM

## 2015-12-11 DIAGNOSIS — Z886 Allergy status to analgesic agent status: Secondary | ICD-10-CM

## 2015-12-11 DIAGNOSIS — K222 Esophageal obstruction: Secondary | ICD-10-CM | POA: Diagnosis present

## 2015-12-11 DIAGNOSIS — F419 Anxiety disorder, unspecified: Secondary | ICD-10-CM | POA: Diagnosis not present

## 2015-12-11 DIAGNOSIS — H409 Unspecified glaucoma: Secondary | ICD-10-CM | POA: Diagnosis present

## 2015-12-11 DIAGNOSIS — J159 Unspecified bacterial pneumonia: Principal | ICD-10-CM | POA: Diagnosis present

## 2015-12-11 DIAGNOSIS — I959 Hypotension, unspecified: Secondary | ICD-10-CM | POA: Diagnosis present

## 2015-12-11 DIAGNOSIS — Z91040 Latex allergy status: Secondary | ICD-10-CM

## 2015-12-11 DIAGNOSIS — Z87891 Personal history of nicotine dependence: Secondary | ICD-10-CM

## 2015-12-11 DIAGNOSIS — E43 Unspecified severe protein-calorie malnutrition: Secondary | ICD-10-CM | POA: Diagnosis present

## 2015-12-11 DIAGNOSIS — I2510 Atherosclerotic heart disease of native coronary artery without angina pectoris: Secondary | ICD-10-CM | POA: Diagnosis present

## 2015-12-11 DIAGNOSIS — L89223 Pressure ulcer of left hip, stage 3: Secondary | ICD-10-CM | POA: Diagnosis present

## 2015-12-11 DIAGNOSIS — J449 Chronic obstructive pulmonary disease, unspecified: Secondary | ICD-10-CM | POA: Diagnosis not present

## 2015-12-11 DIAGNOSIS — Z79899 Other long term (current) drug therapy: Secondary | ICD-10-CM

## 2015-12-11 DIAGNOSIS — Z90710 Acquired absence of both cervix and uterus: Secondary | ICD-10-CM | POA: Diagnosis not present

## 2015-12-11 DIAGNOSIS — Y95 Nosocomial condition: Secondary | ICD-10-CM | POA: Diagnosis present

## 2015-12-11 DIAGNOSIS — Z66 Do not resuscitate: Secondary | ICD-10-CM | POA: Diagnosis present

## 2015-12-11 DIAGNOSIS — L89203 Pressure ulcer of unspecified hip, stage 3: Secondary | ICD-10-CM | POA: Diagnosis present

## 2015-12-11 DIAGNOSIS — D638 Anemia in other chronic diseases classified elsewhere: Secondary | ICD-10-CM | POA: Diagnosis present

## 2015-12-11 DIAGNOSIS — Z7982 Long term (current) use of aspirin: Secondary | ICD-10-CM | POA: Diagnosis not present

## 2015-12-11 DIAGNOSIS — F0390 Unspecified dementia without behavioral disturbance: Secondary | ICD-10-CM | POA: Diagnosis present

## 2015-12-11 DIAGNOSIS — Z881 Allergy status to other antibiotic agents status: Secondary | ICD-10-CM

## 2015-12-11 DIAGNOSIS — N184 Chronic kidney disease, stage 4 (severe): Secondary | ICD-10-CM | POA: Diagnosis present

## 2015-12-11 DIAGNOSIS — Z6820 Body mass index (BMI) 20.0-20.9, adult: Secondary | ICD-10-CM

## 2015-12-11 DIAGNOSIS — Z681 Body mass index (BMI) 19 or less, adult: Secondary | ICD-10-CM

## 2015-12-11 DIAGNOSIS — K219 Gastro-esophageal reflux disease without esophagitis: Secondary | ICD-10-CM | POA: Diagnosis present

## 2015-12-11 DIAGNOSIS — R509 Fever, unspecified: Secondary | ICD-10-CM

## 2015-12-11 DIAGNOSIS — R4182 Altered mental status, unspecified: Secondary | ICD-10-CM | POA: Diagnosis present

## 2015-12-11 DIAGNOSIS — L89213 Pressure ulcer of right hip, stage 3: Secondary | ICD-10-CM | POA: Diagnosis not present

## 2015-12-11 DIAGNOSIS — Z885 Allergy status to narcotic agent status: Secondary | ICD-10-CM | POA: Diagnosis not present

## 2015-12-11 DIAGNOSIS — F329 Major depressive disorder, single episode, unspecified: Secondary | ICD-10-CM | POA: Diagnosis present

## 2015-12-11 DIAGNOSIS — Z882 Allergy status to sulfonamides status: Secondary | ICD-10-CM | POA: Diagnosis not present

## 2015-12-11 DIAGNOSIS — M810 Age-related osteoporosis without current pathological fracture: Secondary | ICD-10-CM | POA: Diagnosis present

## 2015-12-11 DIAGNOSIS — J440 Chronic obstructive pulmonary disease with acute lower respiratory infection: Secondary | ICD-10-CM | POA: Diagnosis present

## 2015-12-11 DIAGNOSIS — M419 Scoliosis, unspecified: Secondary | ICD-10-CM | POA: Diagnosis present

## 2015-12-11 DIAGNOSIS — J189 Pneumonia, unspecified organism: Secondary | ICD-10-CM | POA: Diagnosis present

## 2015-12-11 DIAGNOSIS — F32A Depression, unspecified: Secondary | ICD-10-CM | POA: Diagnosis present

## 2015-12-11 DIAGNOSIS — Z888 Allergy status to other drugs, medicaments and biological substances status: Secondary | ICD-10-CM

## 2015-12-11 LAB — URINALYSIS, ROUTINE W REFLEX MICROSCOPIC
Bilirubin Urine: NEGATIVE
Glucose, UA: NEGATIVE mg/dL
HGB URINE DIPSTICK: NEGATIVE
Ketones, ur: NEGATIVE mg/dL
LEUKOCYTES UA: NEGATIVE
NITRITE: NEGATIVE
PROTEIN: NEGATIVE mg/dL
Specific Gravity, Urine: 1.012 (ref 1.005–1.030)
pH: 7.5 (ref 5.0–8.0)

## 2015-12-11 LAB — COMPREHENSIVE METABOLIC PANEL
ALBUMIN: 3.4 g/dL — AB (ref 3.5–5.0)
ALT: 12 U/L — ABNORMAL LOW (ref 14–54)
ANION GAP: 9 (ref 5–15)
AST: 23 U/L (ref 15–41)
Alkaline Phosphatase: 79 U/L (ref 38–126)
BILIRUBIN TOTAL: 0.5 mg/dL (ref 0.3–1.2)
BUN: 21 mg/dL — ABNORMAL HIGH (ref 6–20)
CHLORIDE: 105 mmol/L (ref 101–111)
CO2: 26 mmol/L (ref 22–32)
Calcium: 8.9 mg/dL (ref 8.9–10.3)
Creatinine, Ser: 0.74 mg/dL (ref 0.44–1.00)
GFR calc Af Amer: >60 mL/min (ref >60)
Glucose, Bld: 99 mg/dL (ref 65–99)
POTASSIUM: 3.8 mmol/L (ref 3.5–5.1)
Sodium: 140 mmol/L (ref 135–145)
TOTAL PROTEIN: 6.7 g/dL (ref 6.5–8.1)

## 2015-12-11 LAB — CBC WITH DIFFERENTIAL/PLATELET
BASOS ABS: 0 10*3/uL (ref 0.0–0.1)
Basophils Relative: 0 %
Eosinophils Absolute: 0 10*3/uL (ref 0.0–0.7)
Eosinophils Relative: 0 %
HEMATOCRIT: 41.1 % (ref 36.0–46.0)
HEMOGLOBIN: 13.2 g/dL (ref 12.0–15.0)
LYMPHS PCT: 14 %
Lymphs Abs: 1.3 10*3/uL (ref 0.7–4.0)
MCH: 30.1 pg (ref 26.0–34.0)
MCHC: 32.1 g/dL (ref 30.0–36.0)
MCV: 93.8 fL (ref 78.0–100.0)
Monocytes Absolute: 0.8 10*3/uL (ref 0.1–1.0)
Monocytes Relative: 8 %
NEUTROS ABS: 7.1 10*3/uL (ref 1.7–7.7)
Neutrophils Relative %: 78 %
Platelets: 261 10*3/uL (ref 150–400)
RBC: 4.38 MIL/uL (ref 3.87–5.11)
RDW: 13.3 % (ref 11.5–15.5)
WBC: 9.2 10*3/uL (ref 4.0–10.5)

## 2015-12-11 LAB — CBC AND DIFFERENTIAL
HEMATOCRIT: 41 % (ref 36–46)
Hemoglobin: 13.2 g/dL (ref 12.0–16.0)
PLATELETS: 261 10*3/uL (ref 150–399)
WBC: 9.2 10*3/mL

## 2015-12-11 LAB — I-STAT CG4 LACTIC ACID, ED: LACTIC ACID, VENOUS: 1.36 mmol/L (ref 0.5–1.9)

## 2015-12-11 LAB — HEPATIC FUNCTION PANEL
ALK PHOS: 79 U/L (ref 25–125)
ALT: 12 U/L (ref 7–35)
AST: 23 U/L (ref 13–35)
BILIRUBIN, TOTAL: 0.5 mg/dL

## 2015-12-11 LAB — BASIC METABOLIC PANEL
BUN: 21 mg/dL (ref 4–21)
CREATININE: 0.7 mg/dL (ref 0.5–1.1)
Glucose: 99 mg/dL
Potassium: 3.8 mmol/L (ref 3.4–5.3)
Sodium: 140 mmol/L (ref 137–147)

## 2015-12-11 LAB — I-STAT TROPONIN, ED: Troponin i, poc: 0 ng/mL (ref 0.00–0.08)

## 2015-12-11 MED ORDER — ONDANSETRON HCL 4 MG PO TABS: 4.0000 mg | ORAL_TABLET | Freq: Four times a day (QID) | ORAL | Status: DC | PRN

## 2015-12-11 MED ORDER — LATANOPROST 0.005 % OP SOLN
1.0000 [drp] | Freq: Every day | OPHTHALMIC | Status: DC
  Administered 2015-12-11 – 2015-12-15 (×3): 1 [drp] via OPHTHALMIC
  Filled 2015-12-11: qty 2.5

## 2015-12-11 MED ORDER — QUETIAPINE FUMARATE ER 50 MG PO TB24
150.0000 mg | ORAL_TABLET | Freq: Every day | ORAL | Status: DC
  Administered 2015-12-11 – 2015-12-12 (×2): 150 mg via ORAL
  Filled 2015-12-11 (×6): qty 3

## 2015-12-11 MED ORDER — MIRTAZAPINE 15 MG PO TABS
45.0000 mg | ORAL_TABLET | Freq: Every day | ORAL | Status: DC
  Administered 2015-12-11 – 2015-12-12 (×2): 45 mg via ORAL
  Filled 2015-12-11 (×4): qty 3

## 2015-12-11 MED ORDER — POLYETHYLENE GLYCOL 3350 17 G PO PACK
17.0000 g | PACK | Freq: Three times a day (TID) | ORAL | Status: DC | PRN
  Administered 2015-12-14: 17 g via ORAL

## 2015-12-11 MED ORDER — PANTOPRAZOLE SODIUM 40 MG PO TBEC
40.0000 mg | DELAYED_RELEASE_TABLET | Freq: Every day | ORAL | Status: DC
  Administered 2015-12-12 – 2015-12-14 (×3): 40 mg via ORAL
  Filled 2015-12-11 (×3): qty 1

## 2015-12-11 MED ORDER — CLORAZEPATE DIPOTASSIUM 7.5 MG PO TABS
7.5000 mg | ORAL_TABLET | Freq: Three times a day (TID) | ORAL | Status: DC
  Administered 2015-12-11 – 2015-12-14 (×8): 7.5 mg via ORAL
  Filled 2015-12-11 (×10): qty 1

## 2015-12-11 MED ORDER — ACETAMINOPHEN 325 MG PO TABS: 650.0000 mg | ORAL_TABLET | Freq: Four times a day (QID) | ORAL | Status: DC | PRN

## 2015-12-11 MED ORDER — NITROGLYCERIN 0.4 MG SL SUBL: 0.4000 mg | SUBLINGUAL_TABLET | SUBLINGUAL | Status: DC | PRN

## 2015-12-11 MED ORDER — VANCOMYCIN HCL IN DEXTROSE 1-5 GM/200ML-% IV SOLN
1000.0000 mg | INTRAVENOUS | Status: AC
  Administered 2015-12-11: 1000 mg via INTRAVENOUS
  Filled 2015-12-11: qty 200

## 2015-12-11 MED ORDER — ORAL CARE MOUTH RINSE
15.0000 mL | Freq: Two times a day (BID) | OROMUCOSAL | Status: DC
  Administered 2015-12-11 – 2015-12-15 (×6): 15 mL via OROMUCOSAL

## 2015-12-11 MED ORDER — VITAMIN D 1000 UNITS PO TABS
1000.0000 [IU] | ORAL_TABLET | Freq: Every day | ORAL | Status: DC
  Administered 2015-12-12 – 2015-12-14 (×3): 1000 [IU] via ORAL
  Filled 2015-12-11 (×3): qty 1

## 2015-12-11 MED ORDER — GRX ANALGESIC BALM EX OINT: 1.0000 "application " | TOPICAL_OINTMENT | Freq: Two times a day (BID) | CUTANEOUS | Status: DC | PRN

## 2015-12-11 MED ORDER — ASPIRIN EC 81 MG PO TBEC
81.0000 mg | DELAYED_RELEASE_TABLET | Freq: Every day | ORAL | Status: DC
  Administered 2015-12-12 – 2015-12-14 (×3): 81 mg via ORAL
  Filled 2015-12-11 (×3): qty 1

## 2015-12-11 MED ORDER — METOPROLOL TARTRATE 25 MG PO TABS
12.5000 mg | ORAL_TABLET | Freq: Two times a day (BID) | ORAL | Status: DC
  Administered 2015-12-11 – 2015-12-14 (×5): 12.5 mg via ORAL
  Filled 2015-12-11 (×7): qty 1

## 2015-12-11 MED ORDER — NYSTATIN 100000 UNIT/ML MT SUSP: 5.0000 mL | Freq: Three times a day (TID) | OROMUCOSAL | Status: DC | PRN

## 2015-12-11 MED ORDER — DOCUSATE SODIUM 100 MG PO CAPS
100.0000 mg | ORAL_CAPSULE | Freq: Two times a day (BID) | ORAL | Status: DC
  Administered 2015-12-11 – 2015-12-14 (×5): 100 mg via ORAL
  Filled 2015-12-11 (×7): qty 1

## 2015-12-11 MED ORDER — SODIUM CHLORIDE 0.9 % IV BOLUS (SEPSIS)
1000.0000 mL | Freq: Once | INTRAVENOUS | Status: AC
Start: 2015-12-11 — End: 2015-12-11
  Administered 2015-12-11: 1000 mL via INTRAVENOUS

## 2015-12-11 MED ORDER — LACTULOSE 10 GM/15ML PO SOLN: 10.0000 g | Freq: Every day | ORAL | Status: DC | PRN

## 2015-12-11 MED ORDER — ENOXAPARIN SODIUM 40 MG/0.4ML ~~LOC~~ SOLN
40.0000 mg | Freq: Every day | SUBCUTANEOUS | Status: DC
  Administered 2015-12-11 – 2015-12-15 (×4): 40 mg via SUBCUTANEOUS
  Filled 2015-12-11 (×4): qty 0.4

## 2015-12-11 MED ORDER — SODIUM CHLORIDE 0.9 % IV BOLUS (SEPSIS): 1000.0000 mL | Freq: Once | INTRAVENOUS | Status: DC

## 2015-12-11 MED ORDER — SODIUM CHLORIDE 0.9% FLUSH
3.0000 mL | Freq: Two times a day (BID) | INTRAVENOUS | Status: DC
  Administered 2015-12-11 – 2015-12-14 (×4): 3 mL via INTRAVENOUS

## 2015-12-11 MED ORDER — ONDANSETRON HCL 4 MG/2ML IJ SOLN: 4.0000 mg | Freq: Four times a day (QID) | INTRAMUSCULAR | Status: DC | PRN

## 2015-12-11 MED ORDER — FLUDROCORTISONE ACETATE 0.1 MG PO TABS
0.1000 mg | ORAL_TABLET | Freq: Every morning | ORAL | Status: DC
  Filled 2015-12-11: qty 1

## 2015-12-11 MED ORDER — PIPERACILLIN-TAZOBACTAM 3.375 G IVPB
3.3750 g | Freq: Once | INTRAVENOUS | Status: AC
  Administered 2015-12-11: 3.375 g via INTRAVENOUS
  Filled 2015-12-11: qty 50

## 2015-12-11 MED ORDER — DORZOLAMIDE HCL-TIMOLOL MAL 2-0.5 % OP SOLN
1.0000 [drp] | Freq: Two times a day (BID) | OPHTHALMIC | Status: DC
  Administered 2015-12-11 – 2015-12-15 (×6): 1 [drp] via OPHTHALMIC
  Filled 2015-12-11: qty 10

## 2015-12-11 MED ORDER — SODIUM CHLORIDE 0.9 % IV SOLN
INTRAVENOUS | Status: DC
  Administered 2015-12-11 – 2015-12-14 (×4): via INTRAVENOUS

## 2015-12-11 MED ORDER — DIVALPROEX SODIUM ER 500 MG PO TB24
500.0000 mg | ORAL_TABLET | Freq: Every day | ORAL | Status: DC
  Administered 2015-12-12 – 2015-12-14 (×3): 500 mg via ORAL
  Filled 2015-12-11 (×4): qty 1

## 2015-12-11 MED ORDER — VANCOMYCIN HCL IN DEXTROSE 1-5 GM/200ML-% IV SOLN: 1000.0000 mg | INTRAVENOUS | Status: DC

## 2015-12-11 MED ORDER — VANCOMYCIN HCL IN DEXTROSE 750-5 MG/150ML-% IV SOLN
750.0000 mg | INTRAVENOUS | Status: DC
  Administered 2015-12-12 – 2015-12-13 (×2): 750 mg via INTRAVENOUS
  Filled 2015-12-11 (×2): qty 150

## 2015-12-11 MED ORDER — HYDROMORPHONE HCL 1 MG/ML IJ SOLN: 0.5000 mg | INTRAMUSCULAR | Status: DC | PRN

## 2015-12-11 MED ORDER — SODIUM CHLORIDE 0.9 % IV BOLUS (SEPSIS)
500.0000 mL | Freq: Once | INTRAVENOUS | Status: AC
  Administered 2015-12-11: 500 mL via INTRAVENOUS

## 2015-12-11 MED ORDER — POLYETHYLENE GLYCOL 3350 17 G PO PACK
17.0000 g | PACK | Freq: Every day | ORAL | Status: DC
  Administered 2015-12-13 – 2015-12-14 (×2): 17 g via ORAL
  Filled 2015-12-11 (×3): qty 1

## 2015-12-11 MED ORDER — HYDROCORTISONE 2.5 % RE CREA
1.0000 "application " | TOPICAL_CREAM | Freq: Two times a day (BID) | RECTAL | Status: DC | PRN
  Administered 2015-12-11: 1 via RECTAL
  Filled 2015-12-11: qty 28.35

## 2015-12-11 MED ORDER — FAMOTIDINE 20 MG PO TABS
20.0000 mg | ORAL_TABLET | Freq: Two times a day (BID) | ORAL | Status: DC
  Administered 2015-12-11: 20 mg via ORAL
  Filled 2015-12-11: qty 1

## 2015-12-11 MED ORDER — FLUCONAZOLE 100 MG PO TABS
150.0000 mg | ORAL_TABLET | Freq: Every day | ORAL | Status: DC
  Administered 2015-12-11: 150 mg via ORAL
  Filled 2015-12-11: qty 2

## 2015-12-11 MED ORDER — SENNA 8.6 MG PO TABS: 2.0000 | ORAL_TABLET | Freq: Two times a day (BID) | ORAL | Status: DC | PRN

## 2015-12-11 MED ORDER — KETOROLAC TROMETHAMINE 15 MG/ML IJ SOLN
15.0000 mg | Freq: Once | INTRAMUSCULAR | Status: AC
  Administered 2015-12-11: 15 mg via INTRAVENOUS
  Filled 2015-12-11: qty 1

## 2015-12-11 MED ORDER — ACETAMINOPHEN 650 MG RE SUPP
650.0000 mg | RECTAL | Status: DC | PRN
  Administered 2015-12-11: 650 mg via RECTAL
  Filled 2015-12-11: qty 1

## 2015-12-11 MED ORDER — DEXTROSE 5 % IV SOLN
1.0000 g | Freq: Two times a day (BID) | INTRAVENOUS | Status: DC
  Administered 2015-12-11 – 2015-12-15 (×8): 1 g via INTRAVENOUS
  Filled 2015-12-11 (×9): qty 1

## 2015-12-11 MED ORDER — POTASSIUM CHLORIDE CRYS ER 20 MEQ PO TBCR
40.0000 meq | EXTENDED_RELEASE_TABLET | Freq: Every day | ORAL | Status: DC
  Administered 2015-12-11 – 2015-12-14 (×4): 40 meq via ORAL
  Filled 2015-12-11 (×4): qty 2

## 2015-12-11 NOTE — H&P (Signed)
History and Physical    [place] UYQ:[id number] DOB: 05/14/1928 DOA: 12/11/2015  PCP: Pcp Not In System   Patient coming from: Home.  Chief Complaint: AMS.  HPI: Taylor Roth is a 82 y.o. female with medical history significant of benzo/narcotics abuse, anxiety, depression, CKD, COPD, GERD, migraines, osteoporosis, scoliosis, substance abuse, vertigo who was brought to the ED via EMS due to altered mental status and fever.   The patient was unable to provide further history, but her daughter says that her mental status has been decreasing rapidly over the past three weeks along with her appetite and nutrition. Today the patient was noticed to be febrile and hypotensive (80/59 mmHg) at [facility], so the facility referred her to the ED via EMS. She was hypoxic at 85 % on arrival to the ED. Her respiratory symptoms are similar to December last year, when I admitted her for aspiration pneumonia.   ED Course:  In the ER, she was found to be febrile at 102.2 degrees Fahrenheit. She was supplemental oxygen. Work up is significant for right lung interstitial prominence. She is being admitted to telemetry for further evaluation and treatment.  Although her daughter stated that she is PCN allergic, she was given IV Zosyn in the ED without any reactions and was taking keflex at the nursing home for her left hip wound as well. On chart review, the patient has multiple allergies that are not significant or just side effects.  Review of Systems: As per HPI otherwise 10 point review of systems negative.    Past Medical History:  Diagnosis Date  . Abuse    benzos/narcotics  . Anxiety   . Arterial tortuosity (aorta) 12/11/2012  . Chronic kidney disease, stage IV (severe) ([place]) 12/11/2012  . Chronic mental illness   . Colitis   . COPD (chronic obstructive pulmonary disease) ([place])   . Depression   . Dysphagia 01/11/2015  . GERD (gastroesophageal reflux disease)   . Insomnia,  unspecified   . Migraines   . Nasal bone fracture 03/30/2014  . Osteoporosis   . Schatzki's ring 01/14/2015  . Scoliosis   . Substance abuse    Benzos and Narcotics, she does not get any of those medicines from [facility], we have empahtically told her that.   . Vertigo   . Vitamin B deficiency   . Weight loss     Past Surgical History:  Procedure Laterality Date  . ABDOMINAL HYSTERECTOMY    . CATARACT EXTRACTION    . CHOLECYSTECTOMY    . ESOPHAGOGASTRODUODENOSCOPY (EGD) WITH PROPOFOL N/A 01/13/2015   Procedure: ESOPHAGOGASTRODUODENOSCOPY (EGD) WITH PROPOFOL;  Surgeon: [name], MD;  Location: [facility] ENDOSCOPY;  Service: Endoscopy;  Laterality: N/A;  . PARTIAL HYSTERECTOMY       reports that she has quit smoking. Her smoking use included Cigarettes. She smoked 0.50 packs per day. She has never used smokeless tobacco. She reports that she does not drink alcohol or use drugs.  Allergies  Allergen Reactions  . Macrobid [Nitrofurantoin Macrocrystal] Rash  . Azithromycin Other (See Comments)    unknown  . Doxycycline Other (See Comments)    unknown  . Escitalopram Oxalate Other (See Comments)    unknown  . Fioricet [Butalbital-Apap-Caffeine] Other (See Comments)    Drunk.  . Latex Other (See Comments)    unknown  . Levofloxacin Other (See Comments)    unknown  . Nsaids Other (See Comments)    Allergic reaction not listed on MAR.  Marland Kitchen Oxycodone  Other (See Comments)    reaction to synthetic codeine  . Restasis [Cyclosporine] Other (See Comments)    unknown  . Sulfa Antibiotics Other (See Comments)    Reaction unknown  . Tylenol [Acetaminophen] Other (See Comments)    jaundice  . Biaxin [Clarithromycin] Rash    With burning sensation    Family History  Problem Relation Age of Onset  . Stroke Mother   . Diabetes Mother   . Heart disease Mother   . Hearing loss Mother   . Cancer Father 84    esophageal    Prior to Admission medications   Medication Sig Start Date End Date  Taking? Authorizing Provider  acetaminophen (TYLENOL) 325 MG tablet Take 650 mg by mouth every 6 (six) hours as needed for headache.   Yes Historical Provider, MD  albuterol (PROVENTIL HFA;VENTOLIN HFA) 108 (90 BASE) MCG/ACT inhaler Inhale 1 puff into the lungs every 6 (six) hours as needed for shortness of breath.    Yes Historical Provider, MD  aspirin EC 81 MG tablet Take 81 mg by mouth daily.   Yes Historical Provider, MD  Biotin 5000 MCG CAPS Take 1 capsule by mouth daily with breakfast.   Yes Historical Provider, MD  Cholecalciferol (VITAMIN D) 1000 UNITS capsule Take 1,000 Units by mouth daily with breakfast.    Yes Historical Provider, MD  clorazepate (TRANXENE) 7.5 MG tablet Take 7.5 mg by mouth 3 (three) times daily.   Yes Historical Provider, MD  Cranberry 425 MG CAPS Take 425 mg by mouth 2 (two) times daily.    Yes Historical Provider, MD  divalproex (DEPAKOTE ER) 500 MG 24 hr tablet Take 500 mg by mouth daily with breakfast.   Yes Historical Provider, MD  dorzolamide-timolol (COSOPT) 22.3-6.8 MG/ML ophthalmic solution Place 1 drop into both eyes 2 (two) times daily.   Yes Historical Provider, MD  fluconazole (DIFLUCAN) 150 MG tablet Take 150 mg by mouth daily.   Yes Historical Provider, MD  fludrocortisone (FLORINEF) 0.1 MG tablet Take 0.1 mg by mouth every morning.   Yes Historical Provider, MD  HYDROcodone-acetaminophen (NORCO) 10-325 MG tablet Take 1 tablet by mouth every 6 (six) hours as needed for severe pain. Patient taking differently: Take 1 tablet by mouth 4 (four) times daily.  01/31/15  Yes Domenic Polite, MD  hydrocortisone (ANUSOL-HC) 2.5 % rectal cream Place 1 application rectally 2 (two) times daily as needed for hemorrhoids or itching.   Yes Historical Provider, MD  lactulose (CHRONULAC) 10 GM/15ML solution Take 15 mLs (10 g total) by mouth daily as needed for severe constipation. 08/12/15  Yes [name], MD  lidocaine (LIDODERM) 5 % Place 1 patch onto the skin every  12 (twelve) hours as needed (for pain). Remove & Discard patch within 12 hours or as directed by MD   Yes Historical Provider, MD  metoprolol tartrate (LOPRESSOR) 25 MG tablet Take 0.5 tablets (12.5 mg total) by mouth 2 (two) times daily. 01/31/15  Yes Domenic Polite, MD  mineral oil-hydrophilic petrolatum (AQUAPHOR) ointment Apply 1 application topically daily.   Yes Historical Provider, MD  mirtazapine (REMERON) 45 MG tablet Take 45 mg by mouth at bedtime.   Yes Historical Provider, MD  nitroGLYCERIN (NITROSTAT) 0.4 MG SL tablet Place 0.4 mg under the tongue every 5 (five) minutes as needed for chest pain. May use 3 times   Yes Historical Provider, MD  nystatin (MYCOSTATIN) 100000 UNIT/ML suspension Take 5 mLs by mouth 3 (three) times daily as needed (mouth pain).  Yes Historical Provider, MD  pantoprazole (PROTONIX) 40 MG tablet Take 1 tablet (40 mg total) by mouth daily at 6 (six) AM. 01/14/15  Yes [name], MD  polyethylene glycol (MIRALAX / GLYCOLAX) packet Take 17 g by mouth daily.    Yes Historical Provider, MD  polyethylene glycol (MIRALAX / GLYCOLAX) packet Take 17 g by mouth 3 (three) times daily as needed for mild constipation.   Yes Historical Provider, MD  potassium chloride SA (K-DUR,KLOR-CON) 20 MEQ tablet Take 2 tablets (40 mEq total) by mouth daily. 01/31/15  Yes Domenic Polite, MD  QUEtiapine Fumarate (SEROQUEL XR) 150 MG 24 hr tablet Take 150 mg by mouth at bedtime. Reported on 01/26/2015   Yes Historical Provider, MD  ranitidine (ZANTAC) 150 MG tablet Take 150 mg by mouth 2 (two) times daily as needed for heartburn.   Yes Historical Provider, MD  senna (SENOKOT) 8.6 MG tablet Take 2 tablets by mouth 2 (two) times daily as needed for constipation.   Yes Historical Provider, MD  travoprost, benzalkonium, (TRAVATAN) 0.004 % ophthalmic solution Place 1 drop into both eyes at bedtime.   Yes Historical Provider, MD  triamcinolone cream (KENALOG) 0.1 % Apply 1 application  topically 3 (three) times daily as needed (worsening symtoms then resume as needed).   Yes Historical Provider, MD  Trolamine Salicylate (ASPERCREME) 10 % LOTN Apply 1 application topically 2 (two) times daily as needed (for painful areas).   Yes Historical Provider, MD  cephALEXin (KEFLEX) 500 MG capsule Take 500 mg by mouth 3 (three) times daily.    Historical Provider, MD  docusate sodium (COLACE) 100 MG capsule Take 100 mg by mouth 2 (two) times daily.    Historical Provider, MD    Physical Exam:  Constitutional: Frail, malnourished, elderly female. Vitals:   12/11/15 1900 12/11/15 1907 12/11/15 1930 12/11/15 2000  BP: 130/63 (!) 130/52 113/63 134/70  Pulse: 96 96 96   Resp: 21 17 16 26   Temp:      TempSrc:      SpO2: 93% 93% 100%   Weight:       Eyes: PERRL, lids and conjunctivae normal ENMT: Mucous membranes are dry. Posterior pharynx clear of any exudate or lesions. Absent dentition.  Neck: normal, supple, no masses, no thyromegaly Respiratory: Decreased breath sounds on bases, but otherwise clear to auscultation bilaterally, no wheezing, no crackles. No accessory muscle use.  Cardiovascular: Regular rate and rhythm, + SEM, no rubs / gallops. No extremity edema. 2+ pedal pulses. No carotid bruits.  Abdomen: Soft, no tenderness, no masses palpated. No hepatosplenomegaly. Bowel sounds positive.  Musculoskeletal: no clubbing / cyanosis. Good ROM, no contractures. Mildly decreased muscle tone.  Skin: Stage 3 left hip decubiti ulcer with mild surrounding erythema and mild serous sanguinous discharge. Neurologic: Lethargic.Does not follow commands. Psychiatric: Lethargic. Does not answer simple questions.    Labs on Admission: I have personally reviewed following labs and imaging studies  CBC:  Recent Labs Lab 12/11/15 1549  WBC 9.2  NEUTROABS 7.1  HGB 13.2  HCT 41.1  MCV 93.8  PLT 099   Basic Metabolic Panel:  Recent Labs Lab 12/11/15 1549  NA 140  K 3.8  CL 105   CO2 26  GLUCOSE 99  BUN 21*  CREATININE 0.74  CALCIUM 8.9   GFR: CrCl cannot be calculated (Unknown ideal weight.). Liver Function Tests:  Recent Labs Lab 12/11/15 1549  AST 23  ALT 12*  ALKPHOS 79  BILITOT 0.5  PROT 6.7  ALBUMIN 3.4*   No results for input(s): LIPASE, AMYLASE in the last 168 hours. No results for input(s): AMMONIA in the last 168 hours. Coagulation Profile: No results for input(s): INR, PROTIME in the last 168 hours. Cardiac Enzymes: No results for input(s): CKTOTAL, CKMB, CKMBINDEX, TROPONINI in the last 168 hours. BNP (last 3 results) No results for input(s): PROBNP in the last 8760 hours. HbA1C: No results for input(s): HGBA1C in the last 72 hours. CBG: No results for input(s): GLUCAP in the last 168 hours. Lipid Profile: No results for input(s): CHOL, HDL, LDLCALC, TRIG, CHOLHDL, LDLDIRECT in the last 72 hours. Thyroid Function Tests: No results for input(s): TSH, T4TOTAL, FREET4, T3FREE, THYROIDAB in the last 72 hours. Anemia Panel: No results for input(s): VITAMINB12, FOLATE, FERRITIN, TIBC, IRON, RETICCTPCT in the last 72 hours. Urine analysis:    Component Value Date/Time   COLORURINE YELLOW 12/11/2015 1810   APPEARANCEUR CLEAR 12/11/2015 1810   LABSPEC 1.012 12/11/2015 1810   PHURINE 7.5 12/11/2015 1810   GLUCOSEU NEGATIVE 12/11/2015 1810   HGBUR NEGATIVE 12/11/2015 1810   BILIRUBINUR NEGATIVE 12/11/2015 1810   BILIRUBINUR neg 06/20/2013 1736   KETONESUR NEGATIVE 12/11/2015 1810   PROTEINUR NEGATIVE 12/11/2015 1810   UROBILINOGEN 0.2 12/25/2014 1620   NITRITE NEGATIVE 12/11/2015 1810   LEUKOCYTESUR NEGATIVE 12/11/2015 1810    Radiological Exams on Admission: Dg Chest Port 1 View  Result Date: 12/11/2015 CLINICAL DATA:  Back pain. EXAM: PORTABLE CHEST 1 VIEW COMPARISON:  August 12, 2015 FINDINGS: The heart size borderline. The hila and mediastinum are unchanged. No pulmonary nodules or masses. Mild interstitial prominence in the  right chest, new in the interval. No other acute abnormalities. IMPRESSION: Mild interstitial prominence in the right lung, asymmetric to the left could represent asymmetric edema or an atypical infection. Recommend clinical correlation and follow-up to resolution. Electronically Signed   By: Dorise Bullion III M.D   On: 12/11/2015 16:16    EKG: Independently reviewed. Vent. rate 83 BPM PR interval * ms QRS duration 91 ms QT/QTc 359/422 ms P-R-T axes * -69 43 Sinus rhythm Left anterior fascicular block Borderline low voltage, extremity leads Abnormal R-wave progression, early transition Positive motion artifact.  Assessment/Plan Principal Problem:   HCAP (healthcare-associated pneumonia) Admit to stepdown/Inpatient. Continue supplemental oxygen. Scheduled and as needed nebs. Vancomycin and Cefepime per pharmacy.  Follow up blood cultures and sensitivity. Check sputum Gram stain, culture and sensitivity. Check strep pneumoniae urinary antigen.  Active Problems:   Decubitus ulcer of hip, stage 3 (HCC) Does not look grossly infected, but has mild surrounding erythema and discharge. Continue IV antibiotics and consult wound care.    Altered mental status Continue treatment of HCAP. Gentle IV hydration. CT scan of the brain was negative for acute intracranial pathology.    Depression Continue Seroquel XR 150 mg by mouth at bedtime. Continue Remeron 45 mg by mouth at bedtime.    Anxiety Continue Tranxene 7.5 mg by mouth 3 times a day.    COPD (chronic obstructive pulmonary disease) (HCC) Continue supplemental oxygen. Duoneb every 6 hours. Albuterol nebs every 4 hrs PRN    GERD (gastroesophageal reflux disease)  Protonix 40 mg po daily. Famotidine 20 mg by mouth BID.   Glaucoma Continue Cosopt I drop in each eye twice a day. Continue Travatan 1 drop in each eye at bedtime.    CAD? Not on past medical history. No echo, stress testing or Cath No mention on previous  six H&P/DC summaries on Epic. However, taking  ASA, metoprolol and NTG.     DVT prophylaxis: Lovenox SQ. Code Status: Full code. Family Communication: Her daughter [name] was present in the room. Disposition Plan: Admit for Iv antibiotic therapy and further evaluation. Consults called:  Admission status: Inpatient/Telemetry.   Reubin Milan MD [name] Pager [phone].  If 7PM-7AM, please contact night-coverage [email or web address] Password [email or web address]  12/11/2015, 8:04 PM

## 2015-12-11 NOTE — ED Notes (Signed)
Bed: YD28 Expected date:  Expected time:  Means of arrival:  Comments: EMS-FTT

## 2015-12-11 NOTE — ED Triage Notes (Signed)
Per EMS patient comes from [facility] for back pain, decrease intake/loss of appetite and hypotensive 80/59.  Ems reports that their initial BP 126/78 on scene and has maintained 133/86.  Staff at SNF reports to EMS that patient has pressure ulcer on left pelvic area and getting treatment for it.

## 2015-12-11 NOTE — ED Provider Notes (Signed)
[facility] DEPT Provider Note   CSN: [id number] Arrival date & time: 12/11/15  1452     History   Chief Complaint Chief Complaint  Patient presents with  . Hypotension  . Anorexia    HPI Taylor Roth is a 82 y.o. female.  The history is provided by the patient. No language interpreter was used.   Taylor Roth is a 82 y.o. female who presents to the Emergency Department complaining of weakness.  Level V caveat due to confusion. History is provided by the patient's daughter. Her daughter reports over the last 10 days she's had a gradual decline. She has decrease activity and decreased oral intake. 10 days ago she was able to bathe and dress herself and ambulate with a walker. Now she cannot sit up without assistance or feed herself. She is sleeping more. She has temperature to 99.5 at assisted living. She has a chronic wound to her left hip that has a small amount of drainage. No cough or vomiting.  She recently completed a course of Keflex 2 days ago. Past Medical History:  Diagnosis Date  . Abuse    benzos/narcotics  . Anxiety   . Arterial tortuosity (aorta) 12/11/2012  . Chronic kidney disease, stage IV (severe) ([place]) 12/11/2012  . Chronic mental illness   . Colitis   . COPD (chronic obstructive pulmonary disease) ([place])   . Depression   . Dysphagia 01/11/2015  . GERD (gastroesophageal reflux disease)   . Insomnia, unspecified   . Migraines   . Nasal bone fracture 03/30/2014  . Osteoporosis   . Schatzki's ring 01/14/2015  . Scoliosis   . Substance abuse    Benzos and Narcotics, she does not get any of those medicines from [facility], we have empahtically told her that.   . Vertigo   . Vitamin B deficiency   . Weight loss     Patient Active Problem List   Diagnosis Date Noted  . HCAP (healthcare-associated pneumonia) 12/11/2015  . Decubitus ulcer of hip, stage 3 ([place]) 12/11/2015  . Ankle pain   . Encephalopathy   . Malnutrition of moderate degree 01/29/2015    . Altered mental status 01/26/2015  . Fever 01/26/2015  . Nausea and vomiting 01/17/2015  . Esophageal dysmotilities   . Fever, unspecified   . Metabolic encephalopathy   . Urinary tract infectious disease   . Aspiration pneumonia ([place]) 01/16/2015  . Weakness 01/14/2015  . Schatzki's ring 01/14/2015  . CKD (chronic kidney disease), stage III 01/14/2015  . Esophagus disorder   . Dysphagia 01/11/2015  . UTI (lower urinary tract infection) 03/30/2014  . Hearing loss, sensorineural 03/04/2014  . Nasal lesion 03/04/2014  . Unspecified constipation 12/12/2012  . Hypokalemia 12/11/2012  . Orthostatic hypotension 12/11/2012  . Arterial tortuosity (aorta) 12/11/2012  . Protein-calorie malnutrition, severe ([place]) 12/10/2012  . Decreased rectal sphincter tone 11/21/2012  . Idiopathic scoliosis 07/08/2012  . Macular degeneration 06/26/2011  . Vision disturbance 06/26/2011  . Glaucoma 06/26/2011  . Anemia 04/17/2011  . Drug-seeking behavior 02/26/2011  . Osteoporosis 02/26/2011  . Depression   . Anxiety   . COPD (chronic obstructive pulmonary disease) ([place])   . GERD (gastroesophageal reflux disease)   . Migraines   . Insomnia, unspecified     Past Surgical History:  Procedure Laterality Date  . ABDOMINAL HYSTERECTOMY    . CATARACT EXTRACTION    . CHOLECYSTECTOMY    . ESOPHAGOGASTRODUODENOSCOPY (EGD) WITH PROPOFOL N/A 01/13/2015   Procedure: ESOPHAGOGASTRODUODENOSCOPY (EGD) WITH PROPOFOL;  Surgeon:  [name], MD;  Location: [facility];  Service: Endoscopy;  Laterality: N/A;  . PARTIAL HYSTERECTOMY      OB History    No data available       Home Medications    Prior to Admission medications   Medication Sig Start Date End Date Taking? Authorizing Provider  acetaminophen (TYLENOL) 325 MG tablet Take 650 mg by mouth every 6 (six) hours as needed for headache.   Yes Historical Provider, MD  albuterol (PROVENTIL HFA;VENTOLIN HFA) 108 (90 BASE) MCG/ACT inhaler Inhale 1  puff into the lungs every 6 (six) hours as needed for shortness of breath.    Yes Historical Provider, MD  aspirin EC 81 MG tablet Take 81 mg by mouth daily.   Yes Historical Provider, MD  Biotin 5000 MCG CAPS Take 1 capsule by mouth daily with breakfast.   Yes Historical Provider, MD  Cholecalciferol (VITAMIN D) 1000 UNITS capsule Take 1,000 Units by mouth daily with breakfast.    Yes Historical Provider, MD  clorazepate (TRANXENE) 7.5 MG tablet Take 7.5 mg by mouth 3 (three) times daily.   Yes Historical Provider, MD  Cranberry 425 MG CAPS Take 425 mg by mouth 2 (two) times daily.    Yes Historical Provider, MD  divalproex (DEPAKOTE ER) 500 MG 24 hr tablet Take 500 mg by mouth daily with breakfast.   Yes Historical Provider, MD  dorzolamide-timolol (COSOPT) 22.3-6.8 MG/ML ophthalmic solution Place 1 drop into both eyes 2 (two) times daily.   Yes Historical Provider, MD  fluconazole (DIFLUCAN) 150 MG tablet Take 150 mg by mouth daily.   Yes Historical Provider, MD  fludrocortisone (FLORINEF) 0.1 MG tablet Take 0.1 mg by mouth every morning.   Yes Historical Provider, MD  HYDROcodone-acetaminophen (NORCO) 10-325 MG tablet Take 1 tablet by mouth every 6 (six) hours as needed for severe pain. Patient taking differently: Take 1 tablet by mouth 4 (four) times daily.  01/31/15  Yes Domenic Polite, MD  hydrocortisone (ANUSOL-HC) 2.5 % rectal cream Place 1 application rectally 2 (two) times daily as needed for hemorrhoids or itching.   Yes Historical Provider, MD  lactulose (CHRONULAC) 10 GM/15ML solution Take 15 mLs (10 g total) by mouth daily as needed for severe constipation. 08/12/15  Yes [name], MD  lidocaine (LIDODERM) 5 % Place 1 patch onto the skin every 12 (twelve) hours as needed (for pain). Remove & Discard patch within 12 hours or as directed by MD   Yes Historical Provider, MD  metoprolol tartrate (LOPRESSOR) 25 MG tablet Take 0.5 tablets (12.5 mg total) by mouth 2 (two) times daily.  01/31/15  Yes Domenic Polite, MD  mineral oil-hydrophilic petrolatum (AQUAPHOR) ointment Apply 1 application topically daily.   Yes Historical Provider, MD  mirtazapine (REMERON) 45 MG tablet Take 45 mg by mouth at bedtime.   Yes Historical Provider, MD  nitroGLYCERIN (NITROSTAT) 0.4 MG SL tablet Place 0.4 mg under the tongue every 5 (five) minutes as needed for chest pain. May use 3 times   Yes Historical Provider, MD  nystatin (MYCOSTATIN) 100000 UNIT/ML suspension Take 5 mLs by mouth 3 (three) times daily as needed (mouth pain).    Yes Historical Provider, MD  pantoprazole (PROTONIX) 40 MG tablet Take 1 tablet (40 mg total) by mouth daily at 6 (six) AM. 01/14/15  Yes [name], MD  polyethylene glycol (MIRALAX / GLYCOLAX) packet Take 17 g by mouth daily.    Yes Historical Provider, MD  polyethylene glycol (MIRALAX /  GLYCOLAX) packet Take 17 g by mouth 3 (three) times daily as needed for mild constipation.   Yes Historical Provider, MD  potassium chloride SA (K-DUR,KLOR-CON) 20 MEQ tablet Take 2 tablets (40 mEq total) by mouth daily. 01/31/15  Yes Domenic Polite, MD  QUEtiapine Fumarate (SEROQUEL XR) 150 MG 24 hr tablet Take 150 mg by mouth at bedtime. Reported on 01/26/2015   Yes Historical Provider, MD  ranitidine (ZANTAC) 150 MG tablet Take 150 mg by mouth 2 (two) times daily as needed for heartburn.   Yes Historical Provider, MD  senna (SENOKOT) 8.6 MG tablet Take 2 tablets by mouth 2 (two) times daily as needed for constipation.   Yes Historical Provider, MD  travoprost, benzalkonium, (TRAVATAN) 0.004 % ophthalmic solution Place 1 drop into both eyes at bedtime.   Yes Historical Provider, MD  triamcinolone cream (KENALOG) 0.1 % Apply 1 application topically 3 (three) times daily as needed (worsening symtoms then resume as needed).   Yes Historical Provider, MD  Trolamine Salicylate (ASPERCREME) 10 % LOTN Apply 1 application topically 2 (two) times daily as needed (for painful areas).   Yes  Historical Provider, MD  cephALEXin (KEFLEX) 500 MG capsule Take 500 mg by mouth 3 (three) times daily.    Historical Provider, MD  docusate sodium (COLACE) 100 MG capsule Take 100 mg by mouth 2 (two) times daily.    Historical Provider, MD    Family History Family History  Problem Relation Age of Onset  . Stroke Mother   . Diabetes Mother   . Heart disease Mother   . Hearing loss Mother   . Cancer Father 27    esophageal    Social History Social History  Substance Use Topics  . Smoking status: Former Smoker    Packs/day: 0.50    Types: Cigarettes  . Smokeless tobacco: Never Used  . Alcohol use No     Allergies   Macrobid [nitrofurantoin macrocrystal]; Azithromycin; Doxycycline; Escitalopram oxalate; Fioricet [butalbital-apap-caffeine]; Latex; Levofloxacin; Nsaids; Oxycodone; Restasis [cyclosporine]; Sulfa antibiotics; Tylenol [acetaminophen]; and Biaxin [clarithromycin]   Review of Systems Review of Systems  All other systems reviewed and are negative.    Physical Exam Updated Vital Signs BP 134/70   Pulse 96   Temp 102.2 F (39 C) (Rectal)   Resp 26   Wt 95 lb (43.1 kg)   SpO2 100%   BMI 18.55 kg/m   Physical Exam  Constitutional: She appears well-developed and well-nourished.  Frail  HENT:  Head: Normocephalic and atraumatic.  Dry mucous membranes  Cardiovascular: Normal rate and regular rhythm.   Systolic ejection murmur  Pulmonary/Chest: Effort normal and breath sounds normal. No respiratory distress.  Abdominal: Soft. There is no tenderness. There is no rebound and no guarding.  Musculoskeletal: She exhibits no edema or tenderness.  There is a decubitus ulcer to the left hip with mild surrounding erythema. There is a small amount of exudate in the base of the wound.  Neurological:  Drowsy and confused with profound generalized weakness  Skin: Skin is warm and dry.  Psychiatric:  Unable to assess  Nursing note and vitals reviewed.    ED  Treatments / Results  Labs (all labs ordered are listed, but only abnormal results are displayed) Labs Reviewed  COMPREHENSIVE METABOLIC PANEL - Abnormal; Notable for the following:       Result Value   BUN 21 (*)    Albumin 3.4 (*)    ALT 12 (*)    All other components within  normal limits  URINE CULTURE  CULTURE, BLOOD (ROUTINE X 2)  CULTURE, BLOOD (ROUTINE X 2)  AEROBIC CULTURE (SUPERFICIAL SPECIMEN)  CBC WITH DIFFERENTIAL/PLATELET  URINALYSIS, ROUTINE W REFLEX MICROSCOPIC (NOT AT [facility])  I-STAT TROPOININ, ED  I-STAT CG4 LACTIC ACID, ED  I-STAT CG4 LACTIC ACID, ED    EKG  EKG Interpretation  Date/Time:  Sunday December 11 2015 15:36:23 EDT Ventricular Rate:  83 PR Interval:    QRS Duration: 91 QT Interval:  359 QTC Calculation: 422 R Axis:   -69 Text Interpretation:  Sinus rhythm Left anterior fascicular block Borderline low voltage, extremity leads Abnormal R-wave progression, early transition Interpretation limited secondary to artifact Confirmed by Hazle Coca [phone]) on 12/11/2015 3:58:08 PM       Radiology Ct Head Wo Contrast  Result Date: 12/11/2015 CLINICAL DATA:  Altered mental status. EXAM: CT HEAD WITHOUT CONTRAST TECHNIQUE: Contiguous axial images were obtained from the base of the skull through the vertex without intravenous contrast. COMPARISON:  CT scan of November 23, 2015. FINDINGS: Brain: Mild chronic ischemic white matter disease is noted. No mass effect or midline shift is noted. Ventricular size is within normal limits. There is no evidence of mass lesion, hemorrhage or acute infarction. Vascular: Atherosclerosis of carotid siphons is noted. Skull: Bony calvarium appears intact. Sinuses/Orbits: Paranasal sinuses appear normal. Other: None. IMPRESSION: Mild chronic ischemic white matter disease. No acute intracranial abnormality seen. Electronically Signed   By: Marijo Conception, M.D.   On: 12/11/2015 20:09   Dg Chest Port 1 View  Result Date:  12/11/2015 CLINICAL DATA:  Back pain. EXAM: PORTABLE CHEST 1 VIEW COMPARISON:  August 12, 2015 FINDINGS: The heart size borderline. The hila and mediastinum are unchanged. No pulmonary nodules or masses. Mild interstitial prominence in the right chest, new in the interval. No other acute abnormalities. IMPRESSION: Mild interstitial prominence in the right lung, asymmetric to the left could represent asymmetric edema or an atypical infection. Recommend clinical correlation and follow-up to resolution. Electronically Signed   By: Dorise Bullion III M.D   On: 12/11/2015 16:16    Procedures Procedures (including critical care time)  Medications Ordered in ED Medications  piperacillin-tazobactam (ZOSYN) IVPB 3.375 g (0 g Intravenous Stopped 12/11/15 2004)  sodium chloride 0.9 % bolus 1,000 mL (0 mLs Intravenous Stopped 12/11/15 2004)     Initial Impression / Assessment and Plan / ED Course  I have reviewed the triage vital signs and the nursing notes.  Pertinent labs & imaging results that were available during my care of the patient were reviewed by me and considered in my medical decision making (see chart for details).  Clinical Course    Patient here for evaluation of decreased responsiveness and progressive weakness. She is drowsy on examination with no focal neurologic deficits. She is noted to be febrile to 102.2. She has a wound to her left hip that is questionable for early infection. UA is not consistent with urinary tract infection. Given Zosyn for possible wound infection and admitted to the hospitalist service for further treatment.  Final Clinical Impressions(s) / ED Diagnoses   Final diagnoses:  None    New Prescriptions Current Discharge Medication List       Quintella Reichert, MD 12/11/15 2045

## 2015-12-11 NOTE — Progress Notes (Signed)
Pharmacy Antibiotic Note  Taylor Roth is a 82 y.o. female admitted on 12/11/2015 with pneumonia.  Pharmacy has been consulted for cefepime and vancomycin dosing.  Plan: Cefepime 1gm IV q12h Vancomycin 1Gm x1 then 750mg  IV q24h  (VT=15-20 mg/L)  Height: 4' 11.84" (152 cm) Weight: 102 lb 1.2 oz (46.3 kg) IBW/kg (Calculated) : 45.14  Temp (24hrs), Avg:101.2 F (38.4 C), Min:97.9 F (36.6 C), Max:103.5 F (39.7 C)   Recent Labs Lab 12/11/15 1549 12/11/15 1600  WBC 9.2  --   CREATININE 0.74  --   LATICACIDVEN  --  1.36    Estimated Creatinine Clearance: 36.6 mL/min (by C-G formula based on SCr of 0.74 mg/dL).    Allergies  Allergen Reactions  . Macrobid [Nitrofurantoin Macrocrystal] Rash  . Azithromycin Other (See Comments)    unknown  . Doxycycline Other (See Comments)    unknown  . Escitalopram Oxalate Other (See Comments)    unknown  . Fioricet [Butalbital-Apap-Caffeine] Other (See Comments)    Drunk.  . Latex Other (See Comments)    unknown  . Levofloxacin Other (See Comments)    unknown  . Nsaids Other (See Comments)    Allergic reaction not listed on MAR.  Marland Kitchen Oxycodone Other (See Comments)    reaction to synthetic codeine  . Restasis [Cyclosporine] Other (See Comments)    unknown  . Sulfa Antibiotics Other (See Comments)    Reaction unknown  . Tylenol [Acetaminophen] Other (See Comments)    jaundice  . Biaxin [Clarithromycin] Rash    With burning sensation    Antimicrobials this admission: 10/29 zosyn >> x1 ED 10/29 cefepime >>  10/20 vancomycin >>  Dose adjustments this admission:   Microbiology results:  BCx:   UCx:    Sputum:    MRSA PCR:   Thank you for allowing pharmacy to be a part of this patient's care.  Dorrene German 12/11/2015 9:52 PM

## 2015-12-11 NOTE — ED Triage Notes (Signed)
Patient c/o pain in back but unable to answer where pain specifically in back is.

## 2015-12-12 DIAGNOSIS — J449 Chronic obstructive pulmonary disease, unspecified: Secondary | ICD-10-CM

## 2015-12-12 DIAGNOSIS — L89213 Pressure ulcer of right hip, stage 3: Secondary | ICD-10-CM

## 2015-12-12 DIAGNOSIS — K219 Gastro-esophageal reflux disease without esophagitis: Secondary | ICD-10-CM

## 2015-12-12 DIAGNOSIS — H409 Unspecified glaucoma: Secondary | ICD-10-CM

## 2015-12-12 DIAGNOSIS — R4182 Altered mental status, unspecified: Secondary | ICD-10-CM

## 2015-12-12 LAB — COMPREHENSIVE METABOLIC PANEL
ALBUMIN: 2.5 g/dL — AB (ref 3.5–5.0)
ALK PHOS: 58 U/L (ref 38–126)
ALT: 9 U/L — ABNORMAL LOW (ref 14–54)
ANION GAP: 5 (ref 5–15)
AST: 20 U/L (ref 15–41)
BUN: 20 mg/dL (ref 6–20)
CALCIUM: 7.9 mg/dL — AB (ref 8.9–10.3)
CHLORIDE: 109 mmol/L (ref 101–111)
CO2: 24 mmol/L (ref 22–32)
Creatinine, Ser: 0.89 mg/dL (ref 0.44–1.00)
GFR calc non Af Amer: 57 mL/min — ABNORMAL LOW (ref >60)
GLUCOSE: 109 mg/dL — AB (ref 65–99)
Potassium: 4.2 mmol/L (ref 3.5–5.1)
SODIUM: 138 mmol/L (ref 135–145)
Total Bilirubin: 0.6 mg/dL (ref 0.3–1.2)
Total Protein: 5.3 g/dL — ABNORMAL LOW (ref 6.5–8.1)

## 2015-12-12 LAB — CBC WITH DIFFERENTIAL/PLATELET
BASOS ABS: 0 10*3/uL (ref 0.0–0.1)
BASOS PCT: 0 %
EOS ABS: 0 10*3/uL (ref 0.0–0.7)
Eosinophils Relative: 0 %
HCT: 32.6 % — ABNORMAL LOW (ref 36.0–46.0)
HEMOGLOBIN: 10.4 g/dL — AB (ref 12.0–15.0)
Lymphocytes Relative: 15 %
Lymphs Abs: 1.6 10*3/uL (ref 0.7–4.0)
MCH: 29.9 pg (ref 26.0–34.0)
MCHC: 31.9 g/dL (ref 30.0–36.0)
MCV: 93.7 fL (ref 78.0–100.0)
MONO ABS: 0.9 10*3/uL (ref 0.1–1.0)
Monocytes Relative: 8 %
NEUTROS ABS: 7.9 10*3/uL — AB (ref 1.7–7.7)
NEUTROS PCT: 77 %
PLATELETS: 204 10*3/uL (ref 150–400)
RBC: 3.48 MIL/uL — ABNORMAL LOW (ref 3.87–5.11)
RDW: 13.6 % (ref 11.5–15.5)
WBC: 10.3 10*3/uL (ref 4.0–10.5)

## 2015-12-12 LAB — BASIC METABOLIC PANEL
BUN: 20 mg/dL (ref 4–21)
CREATININE: 0.9 mg/dL (ref 0.5–1.1)
Glucose: 109 mg/dL
POTASSIUM: 4.2 mmol/L (ref 3.4–5.3)
SODIUM: 138 mmol/L (ref 137–147)

## 2015-12-12 LAB — HEPATIC FUNCTION PANEL
ALK PHOS: 58 U/L (ref 25–125)
ALT: 9 U/L (ref 7–35)
AST: 20 U/L (ref 13–35)
Alkaline Phosphatase: 58 U/L (ref 25–125)
Bilirubin, Total: 0.6 mg/dL

## 2015-12-12 LAB — CBC AND DIFFERENTIAL
HEMATOCRIT: 33 % — AB (ref 36–46)
HEMOGLOBIN: 10.4 g/dL — AB (ref 12.0–16.0)
Neutrophils Absolute: 8 /uL
Platelets: 204 10*3/uL (ref 150–399)
WBC: 10.3 10*3/mL

## 2015-12-12 LAB — STREP PNEUMONIAE URINARY ANTIGEN: STREP PNEUMO URINARY ANTIGEN: NEGATIVE

## 2015-12-12 LAB — MRSA PCR SCREENING: MRSA BY PCR: NEGATIVE

## 2015-12-12 MED ORDER — HYDROCODONE-ACETAMINOPHEN 5-325 MG PO TABS
1.0000 | ORAL_TABLET | ORAL | Status: DC | PRN
  Administered 2015-12-13 – 2015-12-14 (×6): 1 via ORAL
  Filled 2015-12-12 (×6): qty 1

## 2015-12-12 MED ORDER — HYDROCODONE-ACETAMINOPHEN 5-325 MG PO TABS: 1.0000 | ORAL_TABLET | ORAL | Status: DC | PRN

## 2015-12-12 MED ORDER — COLLAGENASE 250 UNIT/GM EX OINT
TOPICAL_OINTMENT | Freq: Every day | CUTANEOUS | Status: DC
  Administered 2015-12-12 – 2015-12-13 (×2): via TOPICAL
  Filled 2015-12-12: qty 30

## 2015-12-12 MED ORDER — ENSURE ENLIVE PO LIQD
237.0000 mL | Freq: Two times a day (BID) | ORAL | Status: DC
  Administered 2015-12-12 – 2015-12-14 (×4): 237 mL via ORAL

## 2015-12-12 NOTE — Evaluation (Signed)
SLP Cancellation Note  Patient Details Name: Taylor Roth MRN: [id number] DOB: [birth year]   Cancelled treatment:       Reason Eval/Treat Not Completed: Other (comment) (pt disoriented and combative, will continue efforts)  Luanna Salk, [facility] SLP [phone]

## 2015-12-12 NOTE — Progress Notes (Signed)
Patient remains disoriented and is combative. Will continue to monitor

## 2015-12-12 NOTE — Progress Notes (Addendum)
Initial Nutrition Assessment  DOCUMENTATION CODES:   Severe malnutrition in context of acute illness/injury  INTERVENTION:  - Will order Ensure Enlive BID, each supplement provides 350 kcal and 20 grams of protein. - Will order Magic Cup TID with meals, each supplement provides 290 kcal and 9 grams of protein. - Will order whole milk on on each meal tray, per daughter's request. - Tech/RN to assist with meals when family is not present.  - RD will continue to monitor for additional nutrition-related needs.  NUTRITION DIAGNOSIS:   Inadequate oral intake related to acute illness, lethargy/confusion, poor appetite as evidenced by per patient/family report.  GOAL:   Patient will meet greater than or equal to 90% of their needs  MONITOR:   PO intake, Supplement acceptance, Weight trends, Labs, Skin, I & O's  REASON FOR ASSESSMENT:   Malnutrition Screening Tool, Consult Assessment of nutrition requirement/status  ASSESSMENT:   82 y.o. female with medical history significant of benzo/narcotics abuse, anxiety, depression, CKD, COPD, GERD, migraines, osteoporosis, scoliosis, substance abuse, vertigo who was brought to the ED via EMS due to altered mental status and fever. The patient was unable to provide further history, but her daughter says that her mental status has been decreasing rapidly over the past three weeks along with her appetite and nutrition. Today the patient was noticed to be febrile and hypotensive (80/59 mmHg) at [facility], so the facility referred her to the ED via EMS. She was hypoxic at 85 % on arrival to the ED. Her respiratory symptoms are similar to December last year, when I admitted her for aspiration pneumonia.   Pt seen for MST and consult. BMI indicates normal weight. No intakes documented since admission. Pt sleeping at time of visit, but daughter is present at bedside and provides all information. Per daughter, pt was at her baseline (completing all ADLs  and had a good appetite) on [birth year] of [birth year]. On 10/11, daughter left for the beach and returned on 10/19. Daughter states that she was informed that while she was at the beach, pt "went on a starvation diet." She states that pt was not eating during that time and became progressively weaker. She states that at breakfast she fed pt because pt is too weak to feed herself.   Talked with daughter about favorite foods or items that can be provided on all meal trays. She states that she has a hard time getting patient to drink water but that pt likes whole milk. She also reports that pt likes orange sherbet and that this was used to give medications this AM. Will order orange cream flavor of Magic Cup to provide additional kcal and protein. Daughter states that since she returned from the beach she has been able to get pt to take a few bites at meals TID and that she would supplement with Ensure, which was recommended by wound care personnel at the ALF. Daughter reports that pt will only drink the vanilla flavor and will only drink it over ice.   Daughter reports that pt typically eats dinner at 5 PM and that she goes to bed between 6 PM and 7 PM. Pt does often nap around 1 PM after eating lunch each day.  Unable to perform physical assessment at this time. Daughter reports that pt weighed 102 lbs on 10/10 and that she is currently down to 95 lbs. This indicates 7 lb weight loss (7% body weight loss) in ~3 weeks which is significant for time frame. Will  continue to monitor weight trends during hospitalization. Pt meets criteria for malnutrition based on weight loss and <50% needed intakes for >/= 5 days.   Medications reviewed; 1000 units vitamin D/day, 100 mg Colace BID, PRN lactulose, 45 mg Remeron at bedtime, PRN Zofran, 17 g Miralax/day, 40 mEq oral KCl/day, 2 tablets Senokot BID. Labs reviewed; Ca: 7.9 mg/dL, GFR: 57 mL/min. IVF: NS @ 75 mL/hr.    Diet Order:  DIET SOFT Room service appropriate?  Yes; Fluid consistency: Thin  Skin:  Wound (see comment) (Stage 3 L hip pressure injury)  Last BM:  PTA  Height:   Ht Readings from Last 1 Encounters:  12/11/15 4' 11.84" (1.52 m)    Weight:   Wt Readings from Last 1 Encounters:  12/11/15 102 lb 1.2 oz (46.3 kg)    Ideal Body Weight:  45.45 kg  BMI:  Body mass index is 20.04 kg/m.  Estimated Nutritional Needs:   Kcal:  1390-1620 (30-35 kcal/kg)  Protein:  56-69 grams (1.2-1.5 grams/kg)  Fluid:  >/= 1.5 L/day  EDUCATION NEEDS:   No education needs identified at this time    Jarome Matin, MS, RD, LDN Inpatient Clinical Dietitian Pager # [phone] After hours/weekend pager # [phone]

## 2015-12-12 NOTE — Plan of Care (Signed)
Problem: Education: Goal: Knowledge of [facility] Education information/materials will improve Outcome: Completed/Met Date Met: 12/12/15 Discussed plan of care with patients daughter [name] this am. Will continue to update daily  Problem: Safety: Goal: Ability to remain free from injury will improve Outcome: Progressing Bed alarm is set

## 2015-12-12 NOTE — Progress Notes (Signed)
PROGRESS NOTE    Taylor Roth  ZOX:[id number]  DOB: April 28, 1928  DOA: 12/11/2015 PCP: Pcp Not In System Outpatient Specialists:   Hospital course:  Taylor Roth is a 82 y.o. female with medical history significant of benzo/narcotics abuse, anxiety, depression, CKD, COPD, GERD, migraines, osteoporosis, scoliosis, substance abuse, vertigo who was brought to the ED via EMS due to altered mental status and fever.  The patient was unable to provide further history, but her daughter says that her mental status has been decreasing rapidly over the past three weeks along with her appetite and nutrition. Today the patient was noticed to be febrile and hypotensive (80/59 mmHg) at [facility], so the facility referred her to the ED via EMS. She was hypoxic at 85 % on arrival to the ED. Her respiratory symptoms are similar to December last year, when I admitted her for aspiration pneumonia.   ED Course:  In the ER, she was found to be febrile at 102.2 degrees Fahrenheit. She was supplemental oxygen. Work up is significant for right lung interstitial prominence. She is being admitted to telemetry for further evaluation and treatment.  Assessment & Plan:   HCAP (healthcare-associated pneumonia) Pneumonia appears atypical on CXR, could be mycoplasma infection given nursing home living conditions Continue supplemental oxygen. Scheduled and as needed nebs. Vancomycin and Cefepime per pharmacy.  Await MRSA swab.  Follow up blood cultures and sensitivity. Check sputum Gram stain, culture and sensitivity. strep pneumoniae urinary antigen pending.  Decubitus ulcer of hip, stage 3  Does not look grossly infected, but has mild surrounding erythema and discharge. Continue IV antibiotics and consult wound care and wound care protocol.    Altered mental status Continue treatment of HCAP. Gentle IV hydration. CT scan of the brain was negative for acute intracranial pathology. Monitor  psychoactive medication usage.    Question of dysphagia  Pt noted to have large amount of thick yellow secretion in corner of mouth at back of throat High risk for aspiration as patient was sleeping at the time, the nurse was able to remove it. Requesting swallow evaluation and SLP consultation.  Aspiration precautions.    Depression Continue Seroquel XR 150 mg by mouth at bedtime. Continue Remeron 45 mg by mouth at bedtime.    Anxiety Pt takesTranxene 7.5 mg by mouth 3 times a day.  COPD (chronic obstructive pulmonary disease)  Continue supplemental oxygen. Duoneb every 6 hours. Albuterol nebs every 4 hrs PRN  GERD (gastroesophageal reflux disease) Protonix 40 mg po daily.   Glaucoma Continue Cosopt I drop in each eye twice a day. Continue Travatan 1 drop in each eye at bedtime.  CAD ASA, metoprolol and NTG.  DVT prophylaxis: Lovenox SQ. Code Status: Full code. Family Communication:  daughter Disposition Plan: Admit for Iv antibiotic therapy and further evaluation.  Subjective: Pt still lethargic but arousable.  Objective: Vitals:   12/11/15 2139 12/12/15 0118 12/12/15 0517 12/12/15 0619  BP:   (!) 92/48 108/60  Pulse:   68 70  Resp:   (!) 21   Temp:  (!) 100.4 F (38 C) 99 F (37.2 C)   TempSrc:  Rectal Rectal   SpO2:   95%   Weight:      Height: 4' 11.84" (1.52 m)       Intake/Output Summary (Last 24 hours) at 12/12/15 0816 Last data filed at 12/12/15 0200  Gross per 24 hour  Intake          1316.25 ml  Output  450 ml  Net           866.25 ml   Filed Weights   12/11/15 1530 12/11/15 2050  Weight: 43.1 kg (95 lb) 46.3 kg (102 lb 1.2 oz)   Exam:  General exam: frail, blind, lethargic but arousable Respiratory system: BBS shallow.  No increased work of breathing. Cardiovascular system: S1 & S2 heard, No JVD. Gastrointestinal system: Abdomen is nondistended, soft. Normal bowel sounds heard. Central nervous system: No focal  neurological deficits. Extremities: no cyanosis or clubbing.   Data Reviewed: Basic Metabolic Panel:  Recent Labs Lab 12/11/15 1549 12/12/15 0442  NA 140 138  K 3.8 4.2  CL 105 109  CO2 26 24  GLUCOSE 99 109*  BUN 21* 20  CREATININE 0.74 0.89  CALCIUM 8.9 7.9*   Liver Function Tests:  Recent Labs Lab 12/11/15 1549 12/12/15 0442  AST 23 20  ALT 12* 9*  ALKPHOS 79 58  BILITOT 0.5 0.6  PROT 6.7 5.3*  ALBUMIN 3.4* 2.5*   No results for input(s): LIPASE, AMYLASE in the last 168 hours. No results for input(s): AMMONIA in the last 168 hours. CBC:  Recent Labs Lab 12/11/15 1549 12/12/15 0442  WBC 9.2 10.3  NEUTROABS 7.1 7.9*  HGB 13.2 10.4*  HCT 41.1 32.6*  MCV 93.8 93.7  PLT 261 204   Cardiac Enzymes: No results for input(s): CKTOTAL, CKMB, CKMBINDEX, TROPONINI in the last 168 hours. CBG (last 3)  No results for input(s): GLUCAP in the last 72 hours. Recent Results (from the past 240 hour(s))  Wound or Superficial Culture     Status: None (Preliminary result)   Collection Time: 12/11/15  7:08 PM  Result Value Ref Range Status   Specimen Description ULCER  Final   Special Requests NONE  Final   Gram Stain   Final    FEW WBC PRESENT,BOTH PMN AND MONONUCLEAR FEW GRAM VARIABLE ROD RARE GRAM POSITIVE COCCI Performed at [facility]    Culture PENDING  Incomplete   Report Status PENDING  Incomplete  MRSA PCR Screening     Status: None   Collection Time: 12/11/15  9:22 PM  Result Value Ref Range Status   MRSA by PCR NEGATIVE NEGATIVE Final    Comment:        The GeneXpert MRSA Assay (FDA approved for NASAL specimens only), is one component of a comprehensive MRSA colonization surveillance program. It is not intended to diagnose MRSA infection nor to guide or monitor treatment for MRSA infections.      Studies: Ct Head Wo Contrast  Result Date: 12/11/2015 CLINICAL DATA:  Altered mental status. EXAM: CT HEAD WITHOUT CONTRAST TECHNIQUE:  Contiguous axial images were obtained from the base of the skull through the vertex without intravenous contrast. COMPARISON:  CT scan of November 23, 2015. FINDINGS: Brain: Mild chronic ischemic white matter disease is noted. No mass effect or midline shift is noted. Ventricular size is within normal limits. There is no evidence of mass lesion, hemorrhage or acute infarction. Vascular: Atherosclerosis of carotid siphons is noted. Skull: Bony calvarium appears intact. Sinuses/Orbits: Paranasal sinuses appear normal. Other: None. IMPRESSION: Mild chronic ischemic white matter disease. No acute intracranial abnormality seen. Electronically Signed   By: Marijo Conception, M.D.   On: 12/11/2015 20:09   Dg Chest Port 1 View  Result Date: 12/11/2015 CLINICAL DATA:  Back pain. EXAM: PORTABLE CHEST 1 VIEW COMPARISON:  August 12, 2015 FINDINGS: The heart size borderline. The hila and mediastinum  are unchanged. No pulmonary nodules or masses. Mild interstitial prominence in the right chest, new in the interval. No other acute abnormalities. IMPRESSION: Mild interstitial prominence in the right lung, asymmetric to the left could represent asymmetric edema or an atypical infection. Recommend clinical correlation and follow-up to resolution. Electronically Signed   By: Dorise Bullion III M.D   On: 12/11/2015 16:16    Scheduled Meds: . aspirin EC  81 mg Oral Daily  . ceFEPime (MAXIPIME) IV  1 g Intravenous Q12H  . cholecalciferol  1,000 Units Oral Q breakfast  . clorazepate  7.5 mg Oral TID  . divalproex  500 mg Oral Q breakfast  . docusate sodium  100 mg Oral BID  . dorzolamide-timolol  1 drop Both Eyes BID  . enoxaparin (LOVENOX) injection  40 mg Subcutaneous QHS  . fluconazole  150 mg Oral Daily  . fludrocortisone  0.1 mg Oral q morning - 10a  . latanoprost  1 drop Both Eyes QHS  . mouth rinse  15 mL Mouth Rinse BID  . metoprolol tartrate  12.5 mg Oral BID  . mirtazapine  45 mg Oral QHS  . pantoprazole  40  mg Oral Q0600  . polyethylene glycol  17 g Oral Daily  . potassium chloride SA  40 mEq Oral Daily  . QUEtiapine Fumarate  150 mg Oral QHS  . sodium chloride flush  3 mL Intravenous Q12H  . vancomycin  750 mg Intravenous Q24H   Continuous Infusions: . sodium chloride 75 mL/hr at 12/11/15 2227    Principal Problem:   HCAP (healthcare-associated pneumonia) Active Problems:   Depression   Anxiety   COPD (chronic obstructive pulmonary disease) (HCC)   GERD (gastroesophageal reflux disease)   Glaucoma   Altered mental status   Decubitus ulcer of hip, stage 3 ([place])   Time spent:   Irwin Brakeman, MD, FAAFP [name] Pager [phone]  If 7PM-7AM, please contact night-coverage [email or web address] Password [email or web address] 12/12/2015, 8:16 AM    LOS: 1 day

## 2015-12-12 NOTE — Care Management Note (Signed)
Case Management Note  Patient Details  Name: Taylor Roth MRN: [id number] Date of Birth: [birth year]  Subjective/Objective:  82 y/o f admitted w/HCAP. From ALF-[facility]-receives HH RN/PT/OT-[facility] per rep Hoyle Sauer.PT cons-await recc.CSW following.                  Action/Plan:d/c plan return back to ALF   Expected Discharge Date:                  Expected Discharge Plan:  Assisted Living / Rest Home  In-House Referral:  Clinical Social Work  Discharge planning Services  CM Consult  Post Acute Care Choice:  Home Health (Active w/[facility] RN PT OT) Choice offered to:     DME Arranged:    DME Agency:     HH Arranged:    HH Agency:     Status of Service:  In process, will continue to follow  If discussed at Long Length of Stay Meetings, dates discussed:    Additional Comments:  Dessa Phi, RN 12/12/2015, 3:47 PM

## 2015-12-12 NOTE — Consult Note (Signed)
[place] Nurse wound consult note Reason for Consult:Unstageable pressure injury to left hip Wound type:Pressure Pressure Ulcer POA: Yes Measurement:3cm x 2cm with depth unable to be determined due to the presence of necrotic slough.  Full thickness Wound bed: See above Drainage (amount, consistency, odor) moderate amount of light yellow drainage consistent with autolysis of necrotic tissue Periwound:intact with a mild erythema measuring 0.4cm circumferentially Dressing procedure/placement/frequency:I will provide a mattress replacement for pressure redistribution and correction of microclimate due to incontinence, Prevalon Boots for heel pressure injury protection and begin wound care with santyl for enzymatic debridement.  An RD consult is recommended. [place] nursing team will not follow, but will remain available to this patient, the nursing and medical teams.  Please re-consult if needed. Thanks, Maudie Flakes, MSN, RN, [place], Arther Abbott  Pager# [phone]

## 2015-12-13 DIAGNOSIS — H548 Legal blindness, as defined in USA: Secondary | ICD-10-CM

## 2015-12-13 LAB — CBC WITH DIFFERENTIAL/PLATELET
BASOS ABS: 0 10*3/uL (ref 0.0–0.1)
Basophils Relative: 0 %
EOS ABS: 0.1 10*3/uL (ref 0.0–0.7)
EOS PCT: 1 %
HCT: 33.6 % — ABNORMAL LOW (ref 36.0–46.0)
Hemoglobin: 10.8 g/dL — ABNORMAL LOW (ref 12.0–15.0)
Lymphocytes Relative: 11 %
Lymphs Abs: 1 10*3/uL (ref 0.7–4.0)
MCH: 30 pg (ref 26.0–34.0)
MCHC: 32.1 g/dL (ref 30.0–36.0)
MCV: 93.3 fL (ref 78.0–100.0)
MONO ABS: 1.1 10*3/uL — AB (ref 0.1–1.0)
Monocytes Relative: 12 %
Neutro Abs: 6.9 10*3/uL (ref 1.7–7.7)
Neutrophils Relative %: 76 %
PLATELETS: 215 10*3/uL (ref 150–400)
RBC: 3.6 MIL/uL — AB (ref 3.87–5.11)
RDW: 13.6 % (ref 11.5–15.5)
WBC: 9 10*3/uL (ref 4.0–10.5)

## 2015-12-13 LAB — COMPREHENSIVE METABOLIC PANEL
ALT: 10 U/L — AB (ref 14–54)
AST: 21 U/L (ref 15–41)
Albumin: 2.7 g/dL — ABNORMAL LOW (ref 3.5–5.0)
Alkaline Phosphatase: 61 U/L (ref 38–126)
Anion gap: 7 (ref 5–15)
BUN: 16 mg/dL (ref 6–20)
CHLORIDE: 114 mmol/L — AB (ref 101–111)
CO2: 22 mmol/L (ref 22–32)
CREATININE: 0.84 mg/dL (ref 0.44–1.00)
Calcium: 8.5 mg/dL — ABNORMAL LOW (ref 8.9–10.3)
Glucose, Bld: 104 mg/dL — ABNORMAL HIGH (ref 65–99)
POTASSIUM: 3.3 mmol/L — AB (ref 3.5–5.1)
SODIUM: 143 mmol/L (ref 135–145)
Total Bilirubin: 0.9 mg/dL (ref 0.3–1.2)
Total Protein: 5.5 g/dL — ABNORMAL LOW (ref 6.5–8.1)

## 2015-12-13 LAB — BASIC METABOLIC PANEL
BUN: 16 mg/dL (ref 4–21)
CREATININE: 0.8 mg/dL (ref 0.5–1.1)
GLUCOSE: 104 mg/dL
POTASSIUM: 3.3 mmol/L — AB (ref 3.4–5.3)
Sodium: 143 mmol/L (ref 137–147)

## 2015-12-13 LAB — CBC AND DIFFERENTIAL
HCT: 34 % — AB (ref 36–46)
Hemoglobin: 10.8 g/dL — AB (ref 12.0–16.0)
Neutrophils Absolute: 7 /uL
PLATELETS: 215 10*3/uL (ref 150–399)
WBC: 9 10*3/mL

## 2015-12-13 LAB — HEPATIC FUNCTION PANEL: Bilirubin, Total: 0.9 mg/dL

## 2015-12-13 NOTE — Clinical Social Work Note (Signed)
Clinical Social Work Assessment  Patient Details  Name: Taylor Roth MRN: [id number] Date of Birth: [birth year]  Date of referral:  12/13/15               Reason for consult:  Facility Placement                Permission sought to share information with:  Facility Art therapist granted to share information::  Yes, Verbal Permission Granted  Name::        Agency::     Relationship::     Contact Information:     Housing/Transportation Living arrangements for the past 2 months:  Taylor Roth of Information:  Patient, Adult Children Patient Interpreter Needed:  None Criminal Activity/Legal Involvement Pertinent to Current Situation/Hospitalization:  No - Comment as needed Significant Relationships:  Adult Children Lives with:  Facility Resident Do you feel safe going back to the place where you live?  No Need for family participation in patient care:  Yes (Comment)  Care giving concerns:  CSW received consult that patient was admitted from [place].    Social Worker assessment / plan:  CSW reviewed PT evaluation recommending SNF at discharge. CSW spoke with patient's daughter, [name] at bedside who is agreeable with plan for SNF.   Employment status:  Retired Forensic scientist:  Information systems manager, Medicaid In [place] PT Recommendations:  [place] / Referral to community resources:  [place]  Patient/Family's Response to care:  Patient's daughter informed CSW that she is thinking about Data processing manager or [employer] - daughter plans to tour this afternoon & get back to [place] with SNF decision.   Patient/Family's Understanding of and Emotional Response to Diagnosis, Current Treatment, and Prognosis:  Patient's daughter states that she just wants her to go somewhere that can take care of her wound.   Emotional Assessment Appearance:  Appears stated age Attitude/Demeanor/Rapport:    Affect (typically  observed):    Orientation:  Oriented to Self Alcohol / Substance use:    Psych involvement (Current and /or in the community):     Discharge Needs  Concerns to be addressed:    Readmission within the last 30 days:    Current discharge risk:    Barriers to Discharge:      Standley Brooking, LCSW 12/13/2015, 1:07 PM

## 2015-12-13 NOTE — Evaluation (Signed)
Physical Therapy Evaluation Patient Details Name: Taylor Roth MRN: [id number] DOB: 11-May-1928 Today's Date: 12/13/2015   History of Present Illness  82 y.o. female with medical history significant of benzo/narcotics abuse, anxiety, depression, CKD, COPD, GERD, migraines, osteoporosis, scoliosis, substance abuse, vertigo who was brought from ALF to ED due to altered mental status and fever, admitted for HCAP (healthcare-associated pneumonia) / bacterial pneumonia unknown organism  Clinical Impression  Pt admitted with above diagnosis. Pt currently with functional limitations due to the deficits listed below (see PT Problem List).  Pt will benefit from skilled PT to increase their independence and safety with mobility to allow discharge to the venue listed below.   Pt assisted with mobility and only able to tolerate standing a couple minutes requiring mod-max assist.  May attempt ambulation next visit if more +2 assist available.  Recommend pt d/c to SNF at this time.     Follow Up Recommendations SNF;Supervision/Assistance - 24 hour    Equipment Recommendations  None recommended by PT    Recommendations for Other Services       Precautions / Restrictions Precautions Precautions: Fall      Mobility  Bed Mobility Overal bed mobility: Needs Assistance Bed Mobility: Supine to Sit;Sit to Supine     Supine to sit: Mod assist Sit to supine: Min assist   General bed mobility comments: multimodal cues for technique, assist for trunk upright and LEs onto bed  Transfers Overall transfer level: Needs assistance Equipment used: Rolling walker (2 wheeled) Transfers: Sit to/from Stand Sit to Stand: Mod assist;Max assist         General transfer comment: verbal cues for technique, assist to rise and steady as well as control descent, pt slightly able to march in place however fatigued quickly  Ambulation/Gait                Stairs            Wheelchair Mobility     Modified Rankin (Stroke Patients Only)       Balance Overall balance assessment: Needs assistance         Standing balance support: Bilateral upper extremity supported;During functional activity Standing balance-Leahy Scale: Zero                               Pertinent Vitals/Pain Pain Assessment: No/denies pain (states she had her pain meds, so no pain)    Home Living Family/patient expects to be discharged to:: Assisted living               Home Equipment: Kasandra Knudsen - single point;Walker - 4 wheels      Prior Function Level of Independence: Independent with assistive device(s)         Comments: pt uses rollator for ambulation, per daughter pt was ambulating around her apt Oct 10 however has since declined with mobility     Hand Dominance        Extremity/Trunk Assessment               Lower Extremity Assessment: Generalized weakness         Communication   Communication: HOH (legally blind)  Cognition Arousal/Alertness: Awake/alert Behavior During Therapy: WFL for tasks assessed/performed Overall Cognitive Status: Impaired/Different from baseline Area of Impairment: Safety/judgement       Following Commands: Follows one step commands consistently Safety/Judgement: Decreased awareness of safety;Decreased awareness of deficits     General Comments: daughter  reports pt's cognition much better today compared to admission however not at baseline    General Comments General comments (skin integrity, edema, etc.): per chart review, stage III pressure injury L hip    Exercises     Assessment/Plan    PT Assessment Patient needs continued PT services  PT Problem List Decreased strength;Decreased activity tolerance;Decreased mobility;Decreased cognition;Decreased balance;Decreased knowledge of use of DME;Decreased skin integrity;Decreased safety awareness          PT Treatment Interventions DME instruction;Gait  training;Functional mobility training;Therapeutic exercise;Therapeutic activities;Patient/family education;Balance training    PT Goals (Current goals can be found in the Care Plan section)  Acute Rehab PT Goals PT Goal Formulation: With patient/family Time For Goal Achievement: 12/20/15 Potential to Achieve Goals: Good    Frequency Min 3X/week   Barriers to discharge        Co-evaluation               End of Session Equipment Utilized During Treatment: Gait belt Activity Tolerance: Patient limited by fatigue Patient left: in bed;with call bell/phone within reach;with family/visitor present           Time: 1139-1205 PT Time Calculation (min) (ACUTE ONLY): 26 min   Charges:   PT Evaluation $PT Eval Moderate Complexity: 1 Procedure     PT G Codes:        Shariff Lasky,KATHrine E 12/13/2015, 12:36 PM Carmelia Bake, PT, DPT 12/13/2015 Pager: [phone]

## 2015-12-13 NOTE — NC FL2 (Addendum)
[place] LEVEL OF CARE SCREENING TOOL     IDENTIFICATION  Patient Name: Taylor Roth Birthdate: 1928-11-21 Sex: female Admission Date (Current Location): 12/11/2015  [facility] and [place] Number:  Herbalist and Address:  [facility],  [place] [address], [place]      Provider Number: [id number]  Attending Physician Name and Address:  Murlean Iba, MD  Relative Name and Phone Number:       Current Level of Care: Hospital Recommended Level of Care: [place] Prior Approval Number:    Date Approved/Denied:   PASRR Number: [id number]  Discharge Plan: SNF    Current Diagnoses: Patient Active Problem List   Diagnosis Date Noted  . HCAP (healthcare-associated pneumonia) 12/11/2015  . Decubitus ulcer of hip, stage 3 ([place]) 12/11/2015  . Ankle pain   . Encephalopathy   . Malnutrition of moderate degree 01/29/2015  . Altered mental status 01/26/2015  . Fever 01/26/2015  . Nausea and vomiting 01/17/2015  . Esophageal dysmotilities   . Fever, unspecified   . Metabolic encephalopathy   . Urinary tract infectious disease   . Aspiration pneumonia ([place]) 01/16/2015  . Weakness 01/14/2015  . Schatzki's ring 01/14/2015  . CKD (chronic kidney disease), stage III 01/14/2015  . Esophagus disorder   . Dysphagia 01/11/2015  . UTI (lower urinary tract infection) 03/30/2014  . Hearing loss, sensorineural 03/04/2014  . Nasal lesion 03/04/2014  . Unspecified constipation 12/12/2012  . Hypokalemia 12/11/2012  . Orthostatic hypotension 12/11/2012  . Arterial tortuosity (aorta) 12/11/2012  . Protein-calorie malnutrition, severe ([place]) 12/10/2012  . Decreased rectal sphincter tone 11/21/2012  . Idiopathic scoliosis 07/08/2012  . Macular degeneration 06/26/2011  . Vision disturbance 06/26/2011  . Glaucoma 06/26/2011  . Anemia 04/17/2011  . Drug-seeking behavior 02/26/2011  . Osteoporosis 02/26/2011  . Depression    . Anxiety   . COPD (chronic obstructive pulmonary disease) ([place])   . GERD (gastroesophageal reflux disease)   . Migraines   . Insomnia, unspecified     Orientation RESPIRATION BLADDER Height & Weight     Self, Time, Situation, Place  Normal Continent Weight: 102 lb 1.2 oz (46.3 kg) Height:  4' 11.84" (152 cm)  BEHAVIORAL SYMPTOMS/MOOD NEUROLOGICAL BOWEL NUTRITION STATUS      Continent Diet (soft)  AMBULATORY STATUS COMMUNICATION OF NEEDS Skin   Extensive Assist Verbally PU Stage and Appropriate Care (Pressure Injury 12/11/15 Unstageable - Full thickness tissue loss in which the base of the ulcer is covered by slough (yellow, tan, gray, green or brown) and/or eschar (tan, brown or black) in the wound bed. (left hip))                       Personal Care Assistance Level of Assistance  Bathing, Dressing Bathing Assistance: Limited assistance   Dressing Assistance: Limited assistance     Functional Limitations Info             SPECIAL CARE FACTORS FREQUENCY  PT (By licensed PT), OT (By licensed OT)     PT Frequency: 5 OT Frequency: 5            Contractures      Additional Factors Info  Code Status, Allergies Code Status Info: DNR Allergies Info:  Macrobid Nitrofurantoin Macrocrystal, Azithromycin, Doxycycline, Escitalopram Oxalate, Fioricet Butalbital-apap-caffeine, Latex, Levofloxacin, Nsaids, Oxycodone, Restasis Cyclosporine, Sulfa Antibiotics, Biaxin Clarithromycin           Current Medications (12/13/2015):  This is  the current hospital active medication list Current Facility-Administered Medications  Medication Dose Route Frequency Provider Last Rate Last Dose  . 0.9 %  sodium chloride infusion   Intravenous Continuous Reubin Milan, MD 75 mL/hr at 12/13/15 0120    . acetaminophen (TYLENOL) suppository 650 mg  650 mg Rectal Q4H PRN Reubin Milan, MD   650 mg at 12/11/15 2306  . acetaminophen (TYLENOL) tablet 650 mg  650 mg Oral Q6H PRN  Reubin Milan, MD      . aspirin EC tablet 81 mg  81 mg Oral Daily Reubin Milan, MD   81 mg at 12/13/15 1004  . ceFEPIme (MAXIPIME) 1 g in dextrose 5 % 50 mL IVPB  1 g Intravenous Q12H Reubin Milan, MD   1 g at 12/13/15 1001  . cholecalciferol (VITAMIN D) tablet 1,000 Units  1,000 Units Oral Q breakfast Reubin Milan, MD   1,000 Units at 12/13/15 1003  . clorazepate (TRANXENE) tablet 7.5 mg  7.5 mg Oral TID Reubin Milan, MD   7.5 mg at 12/13/15 1003  . collagenase (SANTYL) ointment   Topical Daily [place] Marisa Hua, MD      . divalproex (DEPAKOTE ER) 24 hr tablet 500 mg  500 mg Oral Q breakfast Reubin Milan, MD   500 mg at 12/13/15 1004  . docusate sodium (COLACE) capsule 100 mg  100 mg Oral BID Reubin Milan, MD   100 mg at 12/13/15 1003  . dorzolamide-timolol (COSOPT) 22.3-6.8 MG/ML ophthalmic solution 1 drop  1 drop Both Eyes BID Reubin Milan, MD   1 drop at 12/13/15 1005  . enoxaparin (LOVENOX) injection 40 mg  40 mg Subcutaneous QHS Reubin Milan, MD   40 mg at 12/12/15 2210  . feeding supplement (ENSURE ENLIVE) (ENSURE ENLIVE) liquid 237 mL  237 mL Oral BID BM Clanford L Johnson, MD   237 mL at 12/13/15 1004  . fludrocortisone (FLORINEF) tablet 0.1 mg  0.1 mg Oral q morning - 10a Reubin Milan, MD      . [place] 1 application  1 application Topical BID PRN Reubin Milan, MD      . HYDROcodone-acetaminophen (NORCO/VICODIN) 5-325 MG per tablet 1 tablet  1 tablet Oral Q4H PRN Clanford Marisa Hua, MD   1 tablet at 12/13/15 1002  . hydrocortisone (ANUSOL-HC) 2.5 % rectal cream 1 application  1 application Rectal BID PRN Reubin Milan, MD   1 application at 11/94/17 2320  . HYDROmorphone (DILAUDID) injection 0.5 mg  0.5 mg Intravenous Q3H PRN Reubin Milan, MD      . lactulose [facility]) 10 GM/15ML solution 10 g  10 g Oral Daily PRN Reubin Milan, MD      . latanoprost (XALATAN) 0.005 % ophthalmic solution 1  drop  1 drop Both Eyes QHS Reubin Milan, MD   1 drop at 12/12/15 2217  . MEDLINE mouth rinse  15 mL Mouth Rinse BID Reubin Milan, MD   15 mL at 12/13/15 1000  . metoprolol tartrate (LOPRESSOR) tablet 12.5 mg  12.5 mg Oral BID Reubin Milan, MD   12.5 mg at 12/13/15 1004  . mirtazapine (REMERON) tablet 45 mg  45 mg Oral QHS Reubin Milan, MD   45 mg at 12/12/15 2207  . nitroGLYCERIN (NITROSTAT) SL tablet 0.4 mg  0.4 mg Sublingual Q5 min PRN Reubin Milan, MD      . nystatin (  MYCOSTATIN) 100000 UNIT/ML suspension 500,000 Units  5 mL Oral TID PRN Reubin Milan, MD      . ondansetron [facility]) tablet 4 mg  4 mg Oral Q6H PRN Reubin Milan, MD       Or  . ondansetron [facility]) injection 4 mg  4 mg Intravenous Q6H PRN Reubin Milan, MD      . pantoprazole (PROTONIX) EC tablet 40 mg  40 mg Oral Q0600 Reubin Milan, MD   40 mg at 12/13/15 0559  . polyethylene glycol (MIRALAX / GLYCOLAX) packet 17 g  17 g Oral Daily Reubin Milan, MD   17 g at 12/13/15 1002  . polyethylene glycol (MIRALAX / GLYCOLAX) packet 17 g  17 g Oral TID PRN Reubin Milan, MD      . potassium chloride SA (K-DUR,KLOR-CON) CR tablet 40 mEq  40 mEq Oral Daily Reubin Milan, MD   40 mEq at 12/13/15 1003  . QUEtiapine (SEROQUEL XR) 24 hr tablet 150 mg  150 mg Oral QHS Reubin Milan, MD   150 mg at 12/12/15 2202  . senna (SENOKOT) tablet 17.2 mg  2 tablet Oral BID PRN Reubin Milan, MD      . sodium chloride flush (NS) 0.9 % injection 3 mL  3 mL Intravenous Q12H Reubin Milan, MD   3 mL at 12/12/15 2200  . vancomycin (VANCOCIN) IVPB 750 mg/150 ml premix  750 mg Intravenous Q24H Dorrene German, RPH   750 mg at 12/12/15 2220     Discharge Medications: Please see discharge summary for a list of discharge medications.  Relevant Imaging Results:  Relevant Lab Results:   Additional Information SSN # [id number]  Standley Brooking, LCSW

## 2015-12-13 NOTE — Progress Notes (Signed)
Patient had 1.73 second pause. Asymptomatic, eating dinner. MD paged to make aware. No new orders.   Callie Fielding RN

## 2015-12-13 NOTE — Evaluation (Signed)
Clinical/Bedside Swallow Evaluation Patient Details  Name: Taylor Roth MRN: [id number] Date of Birth: 1928-07-25  Today's Date: 12/13/2015 Time: SLP Start Time (ACUTE ONLY): 1337 SLP Stop Time (ACUTE ONLY): 1357 SLP Time Calculation (min) (ACUTE ONLY): 20 min  Past Medical History:  Past Medical History:  Diagnosis Date  . Abuse    benzos/narcotics  . Anxiety   . Arterial tortuosity (aorta) 12/11/2012  . Chronic kidney disease, stage IV (severe) ([place]) 12/11/2012  . Chronic mental illness   . Colitis   . COPD (chronic obstructive pulmonary disease) ([place])   . Depression   . Dysphagia 01/11/2015  . GERD (gastroesophageal reflux disease)   . Insomnia, unspecified   . Migraines   . Nasal bone fracture 03/30/2014  . Osteoporosis   . Schatzki's ring 01/14/2015  . Scoliosis   . Substance abuse    Benzos and Narcotics, she does not get any of those medicines from [facility], we have empahtically told her that.   . Vertigo   . Vitamin B deficiency   . Weight loss    Past Surgical History:  Past Surgical History:  Procedure Laterality Date  . ABDOMINAL HYSTERECTOMY    . CATARACT EXTRACTION    . CHOLECYSTECTOMY    . ESOPHAGOGASTRODUODENOSCOPY (EGD) WITH PROPOFOL N/A 01/13/2015   Procedure: ESOPHAGOGASTRODUODENOSCOPY (EGD) WITH PROPOFOL;  Surgeon: [name], MD;  Location: [facility] ENDOSCOPY;  Service: Endoscopy;  Laterality: N/A;  . PARTIAL HYSTERECTOMY     HPI:  Taylor Roth is a 82 y.o. female with medical history significant of benzo/narcotics abuse, anxiety, depression, CKD, COPD, GERD, migraines, osteoporosis, scoliosis, substance abuse, vertigo who was brought to the ED via EMS due to altered mental status and fever.  The patient was unable to provide further history, but her daughter says that her mental status has been decreasing rapidly over the past three weeks along with her appetite and nutrition. Today the patient was noticed to be febrile and hypotensive (80/59 mmHg) at  [facility], so the facility referred her to the ED via EMS. She was hypoxic at 85 % on arrival to the ED. Her respiratory symptoms are similar to December last year, when I admitted her for aspiration pneumonia.  Mild chronic ischemic white matter disease. No acute intracranial.    Assessment / Plan / Recommendation Clinical Impression  Pt doesn't present with any oropharyngeal deficits this day. Pt consumed moist soft solids with thin liquids without overt s/s of oropharyngeal dysphagia. Given pt's history of esophageal dysfunction, pt would be at increased risk of aspiration and should follow general precuation, remain upright after meals for at least 30 minutes, small frequent meals, small bites/sips and alternate solids with liquids. Pt's oropharyngeal abilities appear functional at this time. Education provided to nursing. ST signing off.     Aspiration Risk  Mild aspiration risk    Diet Recommendation Regular;Thin liquid (Soft)   Liquid Administration via: Straw Medication Administration: Whole meds with puree Supervision: Staff to assist with self feeding Compensations: Minimize environmental distractions;Slow rate;Small sips/bites;Follow solids with liquid Postural Changes: Seated upright at 90 degrees;Remain upright for at least 30 minutes after po intake    Other  Recommendations Oral Care Recommendations: Oral care BID   Follow up Recommendations None        Swallow Study   General Date of Onset: 12/11/15 HPI: Taylor Roth is a 82 y.o. female with medical history significant of benzo/narcotics abuse, anxiety, depression, CKD, COPD, GERD, migraines, osteoporosis, scoliosis, substance abuse, vertigo  who was brought to the ED via EMS due to altered mental status and fever.  The patient was unable to provide further history, but her daughter says that her mental status has been decreasing rapidly over the past three weeks along with her appetite and nutrition. Today the  patient was noticed to be febrile and hypotensive (80/59 mmHg) at [facility], so the facility referred her to the ED via EMS. She was hypoxic at 85 % on arrival to the ED. Her respiratory symptoms are similar to December last year, when I admitted her for aspiration pneumonia.  Mild chronic ischemic white matter disease. No acute intracranial.  Type of Study: Bedside Swallow Evaluation Previous Swallow Assessment: BSE - 01/2015 Recommend continued moist soft diet d/t Pt was seen in ED one week prior with question of an obstructed esophagus. Underwent esophagram showing. residual PO in dilated esophagus. EGD showed a nonobstructive Schatzki's ring, tortuous esophagus but no clear stricture and appeared from the pictures to have a fairly wide open GE junction but marked angulation of the esophagus where food could be getting hung per GI.  Diet Prior to this Study: Dysphagia 3 (soft);Thin liquids Temperature Spikes Noted: No Respiratory Status: Room air History of Recent Intubation: No Behavior/Cognition: Alert;Pleasant mood;Cooperative Oral Cavity Assessment: Within Functional Limits Oral Care Completed by SLP: No Oral Cavity - Dentition: Edentulous Vision: Impaired for self-feeding Self-Feeding Abilities: Needs assist Patient Positioning: Upright in bed Baseline Vocal Quality: Normal Volitional Cough: Strong Volitional Swallow: Able to elicit    Oral/Motor/Sensory Function Overall Oral Motor/Sensory Function: Within functional limits   Ice Chips Ice chips: Within functional limits Presentation: Spoon   Thin Liquid Thin Liquid: Within functional limits Presentation: Straw    Nectar Thick Nectar Thick Liquid: Not tested   Honey Thick Honey Thick Liquid: Not tested   Puree Puree: Within functional limits Presentation: Spoon   Solid   GO   Solid: Within functional limits Presentation: Spoon Other Comments: Moisten soft solids       Taylor Roth B. Rutherford Nail M.S., [place] 12/13/2015,1:58 PM

## 2015-12-13 NOTE — Clinical Social Work Placement (Signed)
   CLINICAL SOCIAL WORK PLACEMENT  NOTE  Date:  12/13/2015  Patient Details  Name: Taylor Roth MRN: [id number] Date of Birth: 21-Aug-1928  Clinical Social Work is seeking post-discharge placement for this patient at the [place] level of care (*CSW will initial, date and re-position this form in  chart as items are completed):  Yes   Patient/family provided with [facility] Work Department's list of facilities offering this level of care within the geographic area requested by the patient (or if unable, by the patient's family).  Yes   Patient/family informed of their freedom to choose among providers that offer the needed level of care, that participate in Medicare, Medicaid or managed care program needed by the patient, have an available bed and are willing to accept the patient.  Yes   Patient/family informed of [facility]'s ownership interest in [facility] and [facility], as well as of the fact that they are under no obligation to receive care at these facilities.  PASRR submitted to EDS on       PASRR number received on       Existing PASRR number confirmed on 12/13/15     FL2 transmitted to all facilities in geographic area requested by pt/family on 12/13/15     FL2 transmitted to all facilities within larger geographic area on       Patient informed that his/her managed care company has contracts with or will negotiate with certain facilities, including the following:        Yes   Patient/family informed of bed offers received.  Patient chooses bed at       Physician recommends and patient chooses bed at      Patient to be transferred to   on  .  Patient to be transferred to facility by       Patient family notified on   of transfer.  Name of family member notified:        PHYSICIAN       Additional Comment:    _______________________________________________ Standley Brooking, LCSW 12/13/2015, 1:10 PM

## 2015-12-13 NOTE — Progress Notes (Signed)
  Progress Note   Date: 12/13/2015  Patient Name: Taylor Roth        MRN#: [id number]   Clarification of the diagnosis of pneumonia:   bacterial pneumonia due to unknown organism HCAP    Irwin Brakeman, MD

## 2015-12-13 NOTE — Progress Notes (Signed)
PROGRESS NOTE    Taylor Roth  GEX:[id number]  DOB: 04-Aug-1928  DOA: 12/11/2015 PCP: Pcp Not In System Outpatient Specialists:   Hospital course:  Taylor Roth is a 82 y.o. female with medical history significant of benzo/narcotics abuse, anxiety, depression, CKD, COPD, GERD, migraines, osteoporosis, scoliosis, substance abuse, vertigo who was brought to the ED via EMS due to altered mental status and fever.  The patient was unable to provide further history, but her daughter says that her mental status has been decreasing rapidly over the past three weeks along with her appetite and nutrition. Today the patient was noticed to be febrile and hypotensive (80/59 mmHg) at [facility], so the facility referred her to the ED via EMS. She was hypoxic at 85 % on arrival to the ED. Her respiratory symptoms are similar to December last year, when I admitted her for aspiration pneumonia.   ED Course:  In the ER, she was found to be febrile at 102.2 degrees Fahrenheit. She was supplemental oxygen. Work up is significant for right lung interstitial prominence. She is being admitted to telemetry for further evaluation and treatment.  Assessment & Plan:   HCAP (healthcare-associated pneumonia) / bacterial pneumonia  Unknown organism Pneumonia appears atypical on CXR, could be mycoplasma infection given nursing home living conditions Continue supplemental oxygen. Scheduled and as needed nebs. Vancomycin and Cefepime per pharmacy.  MRSA swab negative. Plan to de-escalate 11/1 blood cultures NGTD Check sputum Gram stain, culture and sensitivity. strep pneumoniae urinary antigen negative.  Decubitus ulcer of hip, stage 3  Does not look grossly infected, but has mild surrounding erythema and discharge. Continue IV antibiotics and consult wound care and wound care protocol.  Appreciate WOC RN recommendations.   Altered mental status Continue treatment of HCAP. Gentle IV hydration. CT  scan of the brain was negative for acute intracranial pathology. Monitor psychoactive medication usage.    Question of dysphagia  Pt noted to have large amount of thick yellow secretion in corner of mouth at back of throat High risk for aspiration as patient was sleeping at the time, the nurse was able to remove it. Requesting swallow evaluation and SLP consultation.  Aspiration precautions.    Depression Continue Seroquel XR 150 mg by mouth at bedtime. Continue Remeron 45 mg by mouth at bedtime.    Anxiety Pt takesTranxene 7.5 mg by mouth 3 times a day.  COPD (chronic obstructive pulmonary disease)  Continue supplemental oxygen. Duoneb every 6 hours. Albuterol nebs every 4 hrs PRN  GERD (gastroesophageal reflux disease) Protonix 40 mg po daily.   Glaucoma Continue Cosopt I drop in each eye twice a day. Continue Travatan 1 drop in each eye at bedtime.  CAD ASA, metoprolol and NTG.  DVT prophylaxis: Lovenox SQ. Code Status: Full code. Family Communication:  daughter at bedside Disposition Plan: Continue Iv antibiotic therapy   Subjective: Pt more alert today.    Objective: Vitals:   12/12/15 1419 12/12/15 2107 12/13/15 0559 12/13/15 1014  BP: (!) 120/52 (!) 134/59 138/71   Pulse: 73 86 79   Resp: 20 18 16    Temp: 99 F (37.2 C) 97.7 F (36.5 C) 98.9 F (37.2 C)   TempSrc: Axillary Oral Axillary   SpO2: 100% 100% 96% 95%  Weight:      Height:        Intake/Output Summary (Last 24 hours) at 12/13/15 1159 Last data filed at 12/13/15 0600  Gross per 24 hour  Intake  2058.75 ml  Output              750 ml  Net          1308.75 ml   Filed Weights   12/11/15 1530 12/11/15 2050  Weight: 43.1 kg (95 lb) 46.3 kg (102 lb 1.2 oz)   Exam:  General exam: frail, blind, lethargic but arousable Respiratory system: BBS shallow.  No increased work of breathing. Cardiovascular system: S1 & S2 heard, No JVD. Gastrointestinal system: Abdomen is  nondistended, soft. Normal bowel sounds heard. Central nervous system: No focal neurological deficits. Extremities: no cyanosis or clubbing.   Data Reviewed: Basic Metabolic Panel:  Recent Labs Lab 12/11/15 1549 12/12/15 0442 12/13/15 0439  NA 140 138 143  K 3.8 4.2 3.3*  CL 105 109 114*  CO2 26 24 22   GLUCOSE 99 109* 104*  BUN 21* 20 16  CREATININE 0.74 0.89 0.84  CALCIUM 8.9 7.9* 8.5*   Liver Function Tests:  Recent Labs Lab 12/11/15 1549 12/12/15 0442 12/13/15 0439  AST 23 20 21   ALT 12* 9* 10*  ALKPHOS 79 58 61  BILITOT 0.5 0.6 0.9  PROT 6.7 5.3* 5.5*  ALBUMIN 3.4* 2.5* 2.7*   No results for input(s): LIPASE, AMYLASE in the last 168 hours. No results for input(s): AMMONIA in the last 168 hours. CBC:  Recent Labs Lab 12/11/15 1549 12/12/15 0442 12/13/15 0439  WBC 9.2 10.3 9.0  NEUTROABS 7.1 7.9* 6.9  HGB 13.2 10.4* 10.8*  HCT 41.1 32.6* 33.6*  MCV 93.8 93.7 93.3  PLT 261 204 215   Cardiac Enzymes: No results for input(s): CKTOTAL, CKMB, CKMBINDEX, TROPONINI in the last 168 hours. CBG (last 3)  No results for input(s): GLUCAP in the last 72 hours. Recent Results (from the past 240 hour(s))  Blood Culture (routine x 2)     Status: None (Preliminary result)   Collection Time: 12/11/15  3:40 PM  Result Value Ref Range Status   Specimen Description BLOOD BLOOD RIGHT FOREARM  Final   Special Requests IN PEDIATRIC BOTTLE [place]  Final   Culture   Final    NO GROWTH < 24 HOURS Performed at [facility]    Report Status PENDING  Incomplete  Blood Culture (routine x 2)     Status: None (Preliminary result)   Collection Time: 12/11/15  3:49 PM  Result Value Ref Range Status   Specimen Description BLOOD RIGHT ANTECUBITAL  Final   Special Requests BOTTLES DRAWN AEROBIC AND ANAEROBIC 5CC  Final   Culture   Final    NO GROWTH < 24 HOURS Performed at [facility]    Report Status PENDING  Incomplete  Wound or Superficial Culture     Status:  None (Preliminary result)   Collection Time: 12/11/15  7:08 PM  Result Value Ref Range Status   Specimen Description ULCER  Final   Special Requests NONE  Final   Gram Stain   Final    FEW WBC PRESENT,BOTH PMN AND MONONUCLEAR FEW GRAM VARIABLE ROD RARE GRAM POSITIVE COCCI Performed at [facility]    Culture PENDING  Incomplete   Report Status PENDING  Incomplete  MRSA PCR Screening     Status: None   Collection Time: 12/11/15  9:22 PM  Result Value Ref Range Status   MRSA by PCR NEGATIVE NEGATIVE Final    Comment:        The GeneXpert MRSA Assay (FDA approved for NASAL specimens only), is  one component of a comprehensive MRSA colonization surveillance program. It is not intended to diagnose MRSA infection nor to guide or monitor treatment for MRSA infections.      Studies: Ct Head Wo Contrast  Result Date: 12/11/2015 CLINICAL DATA:  Altered mental status. EXAM: CT HEAD WITHOUT CONTRAST TECHNIQUE: Contiguous axial images were obtained from the base of the skull through the vertex without intravenous contrast. COMPARISON:  CT scan of November 23, 2015. FINDINGS: Brain: Mild chronic ischemic white matter disease is noted. No mass effect or midline shift is noted. Ventricular size is within normal limits. There is no evidence of mass lesion, hemorrhage or acute infarction. Vascular: Atherosclerosis of carotid siphons is noted. Skull: Bony calvarium appears intact. Sinuses/Orbits: Paranasal sinuses appear normal. Other: None. IMPRESSION: Mild chronic ischemic white matter disease. No acute intracranial abnormality seen. Electronically Signed   By: Marijo Conception, M.D.   On: 12/11/2015 20:09   Dg Chest Port 1 View  Result Date: 12/11/2015 CLINICAL DATA:  Back pain. EXAM: PORTABLE CHEST 1 VIEW COMPARISON:  August 12, 2015 FINDINGS: The heart size borderline. The hila and mediastinum are unchanged. No pulmonary nodules or masses. Mild interstitial prominence in the right chest,  new in the interval. No other acute abnormalities. IMPRESSION: Mild interstitial prominence in the right lung, asymmetric to the left could represent asymmetric edema or an atypical infection. Recommend clinical correlation and follow-up to resolution. Electronically Signed   By: Dorise Bullion III M.D   On: 12/11/2015 16:16    Scheduled Meds: . aspirin EC  81 mg Oral Daily  . ceFEPime (MAXIPIME) IV  1 g Intravenous Q12H  . cholecalciferol  1,000 Units Oral Q breakfast  . clorazepate  7.5 mg Oral TID  . collagenase   Topical Daily  . divalproex  500 mg Oral Q breakfast  . docusate sodium  100 mg Oral BID  . dorzolamide-timolol  1 drop Both Eyes BID  . enoxaparin (LOVENOX) injection  40 mg Subcutaneous QHS  . feeding supplement (ENSURE ENLIVE)  237 mL Oral BID BM  . fludrocortisone  0.1 mg Oral q morning - 10a  . latanoprost  1 drop Both Eyes QHS  . mouth rinse  15 mL Mouth Rinse BID  . metoprolol tartrate  12.5 mg Oral BID  . mirtazapine  45 mg Oral QHS  . pantoprazole  40 mg Oral Q0600  . polyethylene glycol  17 g Oral Daily  . potassium chloride SA  40 mEq Oral Daily  . QUEtiapine Fumarate  150 mg Oral QHS  . sodium chloride flush  3 mL Intravenous Q12H  . vancomycin  750 mg Intravenous Q24H   Continuous Infusions: . sodium chloride 75 mL/hr at 12/13/15 0120    Principal Problem:   HCAP (healthcare-associated pneumonia) Active Problems:   Depression   Anxiety   COPD (chronic obstructive pulmonary disease) (HCC)   GERD (gastroesophageal reflux disease)   Glaucoma   Altered mental status   Decubitus ulcer of hip, stage 3 ([place])   Time spent:   Irwin Brakeman, MD, FAAFP [name] Pager [phone]  If 7PM-7AM, please contact night-coverage [email or web address] Password [email or web address] 12/13/2015, 11:59 AM    LOS: 2 days

## 2015-12-14 DIAGNOSIS — F419 Anxiety disorder, unspecified: Secondary | ICD-10-CM

## 2015-12-14 LAB — AEROBIC CULTURE  (SUPERFICIAL SPECIMEN)

## 2015-12-14 LAB — LEGIONELLA PNEUMOPHILA SEROGP 1 UR AG: L. pneumophila Serogp 1 Ur Ag: NEGATIVE

## 2015-12-14 MED ORDER — IPRATROPIUM-ALBUTEROL 0.5-2.5 (3) MG/3ML IN SOLN: 3.0000 mL | Freq: Four times a day (QID) | RESPIRATORY_TRACT | Status: DC | PRN

## 2015-12-14 NOTE — Care Management Note (Signed)
Case Management Note  Patient Details  Name: ANEESA ROMEY MRN: [id number] Date of Birth: 1928-11-27  Subjective/Objective: tr states she will tour [employer] today, she likes [facility] also. Noted d/c SNF in am-will need sitter d/c 24hrs prior for safe d/c-MD/Nsg notified. CSW following for d/c SNF.                Action/Plan:d/c SNF.   Expected Discharge Date:                  Expected Discharge Plan:  Skilled Nursing Facility  In-House Referral:  Clinical Social Work  Discharge planning Services  CM Consult  Post Acute Care Choice:  Home Health (Active w/[facility] RN PT OT) Choice offered to:     DME Arranged:    DME Agency:     HH Arranged:    HH Agency:     Status of Service:  In process, will continue to follow  If discussed at Long Length of Stay Meetings, dates discussed:    Additional Comments:  Dessa Phi, RN 12/14/2015, 12:15 PM

## 2015-12-14 NOTE — Plan of Care (Signed)
Problem: Activity: Goal: Risk for activity intolerance will decrease Outcome: Completed/Met Date Met: 12/14/15 Patient is out of bed 1 assist. Gait is slightly unsteady, will continue to progress

## 2015-12-14 NOTE — Progress Notes (Signed)
PROGRESS NOTE [facility]   Taylor Roth   JKK:[id number] DOB: 1928-11-27  DOA: 12/11/2015 PCP: Pcp Not In System   Brief Narrative:  Taylor Roth a 82 y.o.femalewith medical history significant of benzo/narcotics abuse, anxiety, depression, CKD, COPD, GERD, migraines, osteoporosis, scoliosis, substance abuse, vertigo who was brought to the ED via EMS due to altered mental status and fever.  The patient was unable to provide further history, but her daughter says that her mental statushas been decreasing rapidly over the past three weeks along with her appetite and nutrition. Patient was noticed to be febrile and hypotensive (80/59 mmHg) at [facility], so the facility referred her to the ED via EMS. She was hypoxic at 85 % on arrival tothe ED. She was admitted for treatment of HCAP.   Subjective: Patient seen and examined with daughter at bedside. Per daughter she seem to be doing better, patient more interactive, although mentally she does not feels that she close to baseline. Patient is confuse and can't recognize her daughter. Only complaints is lack of sleep.   Assessment & Plan:  HCAP (healthcare-associated pneumonia) / bacterial pneumonia  Unknown organism -Pneumonia appears atypical on CXR, could be mycoplasma infection given nursing home living conditions -Continue supplemental oxygen. -Scheduled and as needed nebs. -D/c Vancomycin  MRSA swab negative. -Continue Cefepime - can switch to oral abx in the AM  -blood cultures NGTD -Check sputum Gram stain, culture and sensitivity. -strep pneumoniae urinary antigen negative.  Decubitus ulcer of hip, stage 3  -Appreciate WOC RN recommendations.  -Wound culture + Morganella Morganii - sensitive to cefepime  -Can D/c vanco   Altered mental status - ? How much this can be attributed to dementia, vs metabolic encephalopathy -Continue treatment of HCAP. -D/c IVF patient tolerating well diet. -CT scan of  the brain was negative for acute intracranial pathology. -Medications reviewed - d/c dilaudid, needs to taper down Tranxene  Depression -Continue Seroquel XR 150 mg by mouth at bedtime. -Continue Remeron 45 mg by mouth at bedtime.  Anxiety -Pt takesTranxene 7.5 mg by mouth 3 times a day, at home - patient would benefit from decreasing amount of this medication, taper down on outpatient setting   COPD (chronic obstructive pulmonary disease)  -Continue supplemental oxygen. -Duoneb every 6 hours PRN   GERD (gastroesophageal reflux disease) Protonix 40 mg po daily.   DVT prophylaxis:Lovenox SQ. Code Status:Full code. Family Communication: daughter at bedside Disposition Plan:Descalate antibiotic therapy, possible d/c tomorrow for SNF    Consultants:   None   Procedures:   None   Antimicrobials:  Vanco 10/29-11/1  Cefepime 10/29 -   Objective: Vitals:   12/13/15 1402 12/13/15 2118 12/14/15 0445 12/14/15 0940  BP:  (!) 148/76 (!) 165/82 (!) 144/70  Pulse:  80 84 76  Resp:  18 20   Temp: 99.5 F (37.5 C) 97.8 F (36.6 C) 98.2 F (36.8 C)   TempSrc: Rectal Oral Oral   SpO2:  95% 96%   Weight:      Height:        Intake/Output Summary (Last 24 hours) at 12/14/15 1147 Last data filed at 12/14/15 0912  Gross per 24 hour  Intake             1700 ml  Output                0 ml  Net             1700 ml   [employer]  12/11/15 1530 12/11/15 2050  Weight: 43.1 kg (95 lb) 46.3 kg (102 lb 1.2 oz)    Examination:  General exam: Appears calm and comfortable - confuse  Respiratory system: Good air entry, mild crackles at the left lower base Cardiovascular system: S1 & S2 heard, RRR. No JVD, murmurs, rubs or gallops Gastrointestinal system: Abdomen is nondistended, soft and nontender. Central nervous system: Confuse, Alert but not oriented to place or time. No focal neurological deficits. Extremities: No pedal edema Skin: Stage 3 decubiti    Psychiatry: Unable to eval    Data Reviewed: I have personally reviewed following labs and imaging studies  CBC:  Recent Labs Lab 12/11/15 1549 12/12/15 0442 12/13/15 0439  WBC 9.2 10.3 9.0  NEUTROABS 7.1 7.9* 6.9  HGB 13.2 10.4* 10.8*  HCT 41.1 32.6* 33.6*  MCV 93.8 93.7 93.3  PLT 261 204 109   Basic Metabolic Panel:  Recent Labs Lab 12/11/15 1549 12/12/15 0442 12/13/15 0439  NA 140 138 143  K 3.8 4.2 3.3*  CL 105 109 114*  CO2 26 24 22   GLUCOSE 99 109* 104*  BUN 21* 20 16  CREATININE 0.74 0.89 0.84  CALCIUM 8.9 7.9* 8.5*   GFR: Estimated Creatinine Clearance: 34.9 mL/min (by C-G formula based on SCr of 0.84 mg/dL). Liver Function Tests:  Recent Labs Lab 12/11/15 1549 12/12/15 0442 12/13/15 0439  AST 23 20 21   ALT 12* 9* 10*  ALKPHOS 79 58 61  BILITOT 0.5 0.6 0.9  PROT 6.7 5.3* 5.5*  ALBUMIN 3.4* 2.5* 2.7*   No results for input(s): LIPASE, AMYLASE in the last 168 hours. No results for input(s): AMMONIA in the last 168 hours. Coagulation Profile: No results for input(s): INR, PROTIME in the last 168 hours. Cardiac Enzymes: No results for input(s): CKTOTAL, CKMB, CKMBINDEX, TROPONINI in the last 168 hours. BNP (last 3 results) No results for input(s): PROBNP in the last 8760 hours. HbA1C: No results for input(s): HGBA1C in the last 72 hours. CBG: No results for input(s): GLUCAP in the last 168 hours. Lipid Profile: No results for input(s): CHOL, HDL, LDLCALC, TRIG, CHOLHDL, LDLDIRECT in the last 72 hours. Thyroid Function Tests: No results for input(s): TSH, T4TOTAL, FREET4, T3FREE, THYROIDAB in the last 72 hours. Anemia Panel: No results for input(s): VITAMINB12, FOLATE, FERRITIN, TIBC, IRON, RETICCTPCT in the last 72 hours. Sepsis Labs:  Recent Labs Lab 12/11/15 1600  LATICACIDVEN 1.36    Recent Results (from the past 240 hour(s))  Blood Culture (routine x 2)     Status: None (Preliminary result)   Collection Time: 12/11/15  3:40 PM   Result Value Ref Range Status   Specimen Description BLOOD BLOOD RIGHT FOREARM  Final   Special Requests IN PEDIATRIC BOTTLE Morgan  Final   Culture   Final    NO GROWTH 2 DAYS Performed at [facility]    Report Status PENDING  Incomplete  Blood Culture (routine x 2)     Status: None (Preliminary result)   Collection Time: 12/11/15  3:49 PM  Result Value Ref Range Status   Specimen Description BLOOD RIGHT ANTECUBITAL  Final   Special Requests BOTTLES DRAWN AEROBIC AND ANAEROBIC 5CC  Final   Culture   Final    NO GROWTH 2 DAYS Performed at [facility]    Report Status PENDING  Incomplete  Wound or Superficial Culture     Status: None (Preliminary result)   Collection Time: 12/11/15  7:08 PM  Result Value Ref Range  Status   Specimen Description ULCER  Final   Special Requests NONE  Final   Gram Stain   Final    FEW WBC PRESENT,BOTH PMN AND MONONUCLEAR FEW GRAM VARIABLE ROD RARE GRAM POSITIVE COCCI Performed at [place]  Final   Report Status PENDING  Incomplete  MRSA PCR Screening     Status: None   Collection Time: 12/11/15  9:22 PM  Result Value Ref Range Status   MRSA by PCR NEGATIVE NEGATIVE Final    Comment:        The GeneXpert MRSA Assay (FDA approved for NASAL specimens only), is one component of a comprehensive MRSA colonization surveillance program. It is not intended to diagnose MRSA infection nor to guide or monitor treatment for MRSA infections.       Radiology Studies: No results found.    Scheduled Meds: . aspirin EC  81 mg Oral Daily  . ceFEPime (MAXIPIME) IV  1 g Intravenous Q12H  . cholecalciferol  1,000 Units Oral Q breakfast  . clorazepate  7.5 mg Oral TID  . collagenase   Topical Daily  . divalproex  500 mg Oral Q breakfast  . docusate sodium  100 mg Oral BID  . dorzolamide-timolol  1 drop Both Eyes BID  . enoxaparin (LOVENOX) injection  40 mg Subcutaneous QHS  . feeding  supplement (ENSURE ENLIVE)  237 mL Oral BID BM  . fludrocortisone  0.1 mg Oral q morning - 10a  . latanoprost  1 drop Both Eyes QHS  . mouth rinse  15 mL Mouth Rinse BID  . metoprolol tartrate  12.5 mg Oral BID  . mirtazapine  45 mg Oral QHS  . pantoprazole  40 mg Oral Q0600  . polyethylene glycol  17 g Oral Daily  . potassium chloride SA  40 mEq Oral Daily  . QUEtiapine Fumarate  150 mg Oral QHS  . sodium chloride flush  3 mL Intravenous Q12H   Continuous Infusions: . sodium chloride 75 mL/hr at 12/14/15 0423     LOS: 3 days    Chipper Oman, MD [name] Pager [phone]  If 7PM-7AM, please contact night-coverage [email or web address] Password [email or web address] 12/14/2015, 11:47 AM

## 2015-12-14 NOTE — Clinical Social Work Note (Signed)
MSW attempted to contact patient's dtr, [name] and left a message in regards to bed decision.   SNF: [facility] vs [facility].   MSW remains available as needed.   Glendon Axe, MSW [phone] 12/14/2015 4:06 PM

## 2015-12-14 NOTE — Plan of Care (Signed)
Problem: Safety: Goal: Ability to remain free from injury will improve Outcome: Completed/Met Date Met: 12/14/15 Sitter at bedside to maintain safety. Pt is also in low bed with alarm set. Discussed safety prevention plan with daughter

## 2015-12-14 NOTE — Progress Notes (Signed)
Patient's behavior turned to being combative again. She is resistant to care,  refused to take her oral medications and eye drops.

## 2015-12-15 ENCOUNTER — Inpatient Hospital Stay (HOSPITAL_COMMUNITY): Payer: Medicare Other

## 2015-12-15 DIAGNOSIS — F329 Major depressive disorder, single episode, unspecified: Secondary | ICD-10-CM

## 2015-12-15 LAB — BASIC METABOLIC PANEL
Anion gap: 8 (ref 5–15)
BUN: 15 mg/dL (ref 4–21)
BUN: 15 mg/dL (ref 6–20)
CALCIUM: 8.9 mg/dL (ref 8.9–10.3)
CO2: 23 mmol/L (ref 22–32)
CREATININE: 0.7 mg/dL (ref 0.5–1.1)
CREATININE: 0.74 mg/dL (ref 0.44–1.00)
Chloride: 111 mmol/L (ref 101–111)
GFR calc non Af Amer: >60 mL/min (ref >60)
Glucose, Bld: 95 mg/dL (ref 65–99)
Glucose: 95 mg/dL
Potassium: 3.9 mmol/L (ref 3.5–5.1)
SODIUM: 142 mmol/L (ref 137–147)
SODIUM: 142 mmol/L (ref 135–145)

## 2015-12-15 LAB — CBC
HCT: 34.1 % — ABNORMAL LOW (ref 36.0–46.0)
Hemoglobin: 10.9 g/dL — ABNORMAL LOW (ref 12.0–15.0)
MCH: 29.4 pg (ref 26.0–34.0)
MCHC: 32 g/dL (ref 30.0–36.0)
MCV: 91.9 fL (ref 78.0–100.0)
PLATELETS: 259 10*3/uL (ref 150–400)
RBC: 3.71 MIL/uL — ABNORMAL LOW (ref 3.87–5.11)
RDW: 13.5 % (ref 11.5–15.5)
WBC: 6.1 10*3/uL (ref 4.0–10.5)

## 2015-12-15 LAB — MAGNESIUM: MAGNESIUM: 1.5 mg/dL — AB (ref 1.7–2.4)

## 2015-12-15 LAB — CBC AND DIFFERENTIAL: WBC: 6.1 10*3/mL

## 2015-12-15 MED ORDER — HYDROCODONE-ACETAMINOPHEN 10-325 MG PO TABS: 1.0000 | ORAL_TABLET | Freq: Four times a day (QID) | ORAL | 0 refills | Status: DC | PRN

## 2015-12-15 MED ORDER — AMOXICILLIN-POT CLAVULANATE 875-125 MG PO TABS: 1.0000 | ORAL_TABLET | Freq: Two times a day (BID) | ORAL | 0 refills | Status: DC

## 2015-12-15 MED ORDER — DEXTROSE 5 % IV SOLN
500.0000 mg | Freq: Once | INTRAVENOUS | Status: AC
  Administered 2015-12-15: 500 mg via INTRAVENOUS
  Filled 2015-12-15: qty 5

## 2015-12-15 MED ORDER — ENSURE ENLIVE PO LIQD: 237.0000 mL | Freq: Two times a day (BID) | ORAL | 12 refills | Status: DC

## 2015-12-15 MED ORDER — COLLAGENASE 250 UNIT/GM EX OINT: TOPICAL_OINTMENT | Freq: Every day | CUTANEOUS | 0 refills | Status: DC

## 2015-12-15 MED ORDER — CEFPROZIL 500 MG PO TABS: 500.0000 mg | ORAL_TABLET | Freq: Two times a day (BID) | ORAL | 0 refills | Status: DC

## 2015-12-15 MED ORDER — MAGNESIUM CHLORIDE 64 MG PO TBEC
2.0000 | DELAYED_RELEASE_TABLET | Freq: Once | ORAL | Status: DC
  Filled 2015-12-15: qty 2

## 2015-12-15 MED ORDER — CLORAZEPATE DIPOTASSIUM 3.75 MG PO TABS: 3.5000 mg | ORAL_TABLET | Freq: Three times a day (TID) | ORAL | 0 refills | Status: DC

## 2015-12-15 MED ORDER — MAGNESIUM SULFATE 2 GM/50ML IV SOLN
2.0000 g | Freq: Once | INTRAVENOUS | Status: AC
  Administered 2015-12-15: 2 g via INTRAVENOUS
  Filled 2015-12-15: qty 50

## 2015-12-15 MED ORDER — QUETIAPINE FUMARATE ER 150 MG PO TB24: 150.0000 mg | ORAL_TABLET | Freq: Every day | ORAL | 0 refills | Status: DC

## 2015-12-15 NOTE — Progress Notes (Signed)
The patient was transferred to room 1427 after the nursing decision was made that the patient would receive the best care possible in that location. The  RN did call the family member to let them know that the patient had been moved to another room for improved supervision of this high fall risk patient.

## 2015-12-15 NOTE — Clinical Social Work Placement (Addendum)
CSW received call from patient's daughter, [name] that they have accepted bed offer at [place] at [facility] made aware. Awaiting PASRR - 30 day note faxed in, anticipating should receive PASRR today. CSW has completed FL2 & will continue to follow and assist with discharge when ready.   Update: PASRR received - anticipating discharge today.    Raynaldo Opitz, [facility] Clinical Social Worker cell #: [phone]     CLINICAL SOCIAL WORK PLACEMENT  NOTE  Date:  12/15/2015  Patient Details  Name: LEATHIA FARNELL MRN: [id number] Date of Birth: 10-Oct-1928  Clinical Social Work is seeking post-discharge placement for this patient at the [place] level of care (*CSW will initial, date and re-position this form in  chart as items are completed):  Yes   Patient/family provided with [facility] Work Department's list of facilities offering this level of care within the geographic area requested by the patient (or if unable, by the patient's family).  Yes   Patient/family informed of their freedom to choose among providers that offer the needed level of care, that participate in Medicare, Medicaid or managed care program needed by the patient, have an available bed and are willing to accept the patient.  Yes   Patient/family informed of [facility]'s ownership interest in [facility] and [facility], as well as of the fact that they are under no obligation to receive care at these facilities.  PASRR submitted to EDS on       PASRR number received on       Existing PASRR number confirmed on 12/15/15     FL2 transmitted to all facilities in geographic area requested by pt/family on       FL2 transmitted to all facilities within larger geographic area on       Patient informed that his/her managed care company has contracts with or will negotiate with certain facilities, including the following:        Yes    Patient/family informed of bed offers received.  Patient chooses bed at [facility] and [facility] recommends and patient chooses bed at      Patient to be transferred to [facility] on  .  Patient to be transferred to facility by       Patient family notified on   of transfer.  Name of family member notified:        PHYSICIAN       Additional Comment:    _______________________________________________ Standley Brooking, LCSW 12/15/2015, 11:55 AM

## 2015-12-15 NOTE — Discharge Summary (Addendum)
Physician Discharge Summary  Taylor Roth  JIR:[id number]  DOB: March 20, 1928  DOA: 12/11/2015 PCP: Pcp Not In System  Admit date: 12/11/2015 Discharge date: 12/15/2015  Admitted From: ALF Disposition:  SNF   Recommendations for Outpatient Follow-up:  1. Follow up with PCP in 1-2 weeks 2. Please obtain BMP/CBC in one week  Home Health: PT  Equipment/Devices: None   Discharge Condition: Stable  CODE STATUS: DNR Diet recommendation: Heart Healthy / Carb Modified   Brief/Interim Summary: Taylor Roth a 82 y.o.femalewith medical history significant of benzo/narcotics abuse, anxiety, depression, CKD, COPD, GERD, migraines, osteoporosis, scoliosis, substance abuse, vertigo who was brought to the ED via EMS due to altered mental status and fever. The patient was unable to provide further history, but her daughter says that her mental statushas been decreasing rapidly over the past three weeks along with her appetite and nutrition. Patient was noticed to be febrile and hypotensive (80/59 mmHg) at [facility], so the facility referred her to the ED via EMS. She was hypoxic at 85 % on arrival tothe ED. She was admitted for treatment of HCAP. IVF and antibiotics were given, patient BP improved. Patient mental status close to baseline, although remained disoriented due to dementia. Patient will go to SNF for STR and finish therapeutic treatment   Subjective: Patient seen and examined today, patient reports improvement in her respiratory status. Stated that she has not been resting well causing her to be uncomfortable. Patient is alert but disoriented to time and place. No acute events overnight remains afebrile and blood pressure is stable.  Discharge Diagnoses:   HCAP (healthcare-associated pneumonia) / bacterial pneumonia Unknown organism -Pneumonia appears atypical on CXR - Blood Cx NG up to date  -MRSA swab negative. -strep pneumoniae urinary antigen negative. -Cefzil to  complete 10 day - it will help also with decuti  Decubitus ulcer of hip, stage 3  -Appreciate WOC RN recommendations.  -Wound culture + Morganella Morganii - continue Ceftilz for 10 days -Santyl for enzymatic debridement  -Frequent turning, and dressing changes   Altered mental status - ? How much this can be attributed to dementia, vs metabolic encephalopathy. Improving close to baseline - Dementia playing at big roll, but also patient with big polypharmacy.  -CT scan of the brain was negative for acute intracranial pathology. -Medications reviewed -Tranxene dose decrease, recommend to start discontinuing opiates as well -Follow up with PMD   Depression -Continue Seroquel XR 150 mg by mouth at bedtime. -Continue Remeron 45 mg by mouth at bedtime.  Anxiety -Pt takesTranxene 7.5 mg by mouth 3 times a day, at home - patient would benefit from decreasing amount of this medication, taper down on outpatient setting - Dose changed to 3.5mg  TID monitor, can add SSRI if anxiety continues    COPD (chronic obstructive pulmonary disease) - stable -Continue home medications   GERD (gastroesophageal reflux disease) -Continue Protonix 40 mg po daily.  Anemia of chronic diseases - Hb stable  -Follow up as outpatient   Discharge Instructions  Discharge Instructions    Call MD for:  difficulty breathing, headache or visual disturbances    Complete by:  As directed    Call MD for:  extreme fatigue    Complete by:  As directed    Call MD for:  hives    Complete by:  As directed    Call MD for:  persistant dizziness or light-headedness    Complete by:  As directed    Call MD for:  persistant nausea and vomiting    Complete by:  As directed    Call MD for:  redness, tenderness, or signs of infection (pain, swelling, redness, odor or green/yellow discharge around incision site)    Complete by:  As directed    Call MD for:  severe uncontrolled pain    Complete by:  As directed    Call MD  for:  temperature >100.4    Complete by:  As directed    Diet - low sodium heart healthy    Complete by:  As directed    Discharge instructions    Complete by:  As directed    Increase activity slowly    Complete by:  As directed        Medication List    STOP taking these medications   cephALEXin 500 MG capsule Commonly known as:  KEFLEX   fluconazole 150 MG tablet Commonly known as:  DIFLUCAN   triamcinolone cream 0.1 % Commonly known as:  KENALOG     TAKE these medications   acetaminophen 325 MG tablet Commonly known as:  TYLENOL Take 650 mg by mouth every 6 (six) hours as needed for headache.   albuterol 108 (90 Base) MCG/ACT inhaler Commonly known as:  PROVENTIL HFA;VENTOLIN HFA Inhale 1 puff into the lungs every 6 (six) hours as needed for shortness of breath.   ASPERCREME 10 % Lotn Generic drug:  Trolamine Salicylate Apply 1 application topically 2 (two) times daily as needed (for painful areas).   aspirin EC 81 MG tablet Take 81 mg by mouth daily.   Biotin 5000 MCG Caps Take 1 capsule by mouth daily with breakfast.   cefPROZIL 500 MG tablet Commonly known as:  CEFZIL Take 1 tablet (500 mg total) by mouth 2 (two) times daily.   clorazepate 3.75 MG tablet Commonly known as:  TRANXENE Take 1 tablet (3.75 mg total) by mouth 3 (three) times daily. What changed:  medication strength  how much to take   collagenase ointment Commonly known as:  SANTYL Apply topically daily.   Cranberry 425 MG Caps Take 425 mg by mouth 2 (two) times daily.   divalproex 500 MG 24 hr tablet Commonly known as:  DEPAKOTE ER Take 500 mg by mouth daily with breakfast.   docusate sodium 100 MG capsule Commonly known as:  COLACE Take 100 mg by mouth 2 (two) times daily.   dorzolamide-timolol 22.3-6.8 MG/ML ophthalmic solution Commonly known as:  COSOPT Place 1 drop into both eyes 2 (two) times daily.   feeding supplement (ENSURE ENLIVE) Liqd Take 237 mLs by mouth 2  (two) times daily between meals.   fludrocortisone 0.1 MG tablet Commonly known as:  FLORINEF Take 0.1 mg by mouth every morning.   HYDROcodone-acetaminophen 10-325 MG tablet Commonly known as:  NORCO Take 1 tablet by mouth every 6 (six) hours as needed for severe pain. What changed:  when to take this   hydrocortisone 2.5 % rectal cream Commonly known as:  ANUSOL-HC Place 1 application rectally 2 (two) times daily as needed for hemorrhoids or itching.   lactulose 10 GM/15ML solution Commonly known as:  CHRONULAC Take 15 mLs (10 g total) by mouth daily as needed for severe constipation.   lidocaine 5 % Commonly known as:  LIDODERM Place 1 patch onto the skin every 12 (twelve) hours as needed (for pain). Remove & Discard patch within 12 hours or as directed by MD   metoprolol tartrate 25 MG tablet Commonly known as:  LOPRESSOR Take 0.5 tablets (12.5 mg total) by mouth 2 (two) times daily.   mineral oil-hydrophilic petrolatum ointment Apply 1 application topically daily.   mirtazapine 45 MG tablet Commonly known as:  REMERON Take 45 mg by mouth at bedtime.   nitroGLYCERIN 0.4 MG SL tablet Commonly known as:  NITROSTAT Place 0.4 mg under the tongue every 5 (five) minutes as needed for chest pain. May use 3 times   nystatin 100000 UNIT/ML suspension Commonly known as:  MYCOSTATIN Take 5 mLs by mouth 3 (three) times daily as needed (mouth pain).   pantoprazole 40 MG tablet Commonly known as:  PROTONIX Take 1 tablet (40 mg total) by mouth daily at 6 (six) AM.   polyethylene glycol packet Commonly known as:  MIRALAX / GLYCOLAX Take 17 g by mouth daily. What changed:  Another medication with the same name was removed. Continue taking this medication, and follow the directions you see here.   potassium chloride SA 20 MEQ tablet Commonly known as:  K-DUR,KLOR-CON Take 2 tablets (40 mEq total) by mouth daily.   QUEtiapine Fumarate 150 MG 24 hr tablet Commonly known as:   SEROQUEL XR Take 150 mg by mouth at bedtime. Reported on 01/26/2015   ranitidine 150 MG tablet Commonly known as:  ZANTAC Take 150 mg by mouth 2 (two) times daily as needed for heartburn.   senna 8.6 MG tablet Commonly known as:  SENOKOT Take 2 tablets by mouth 2 (two) times daily as needed for constipation.   travoprost (benzalkonium) 0.004 % ophthalmic solution Commonly known as:  TRAVATAN Place 1 drop into both eyes at bedtime.   Vitamin D 1000 units capsule Take 1,000 Units by mouth daily with breakfast.       Allergies  Allergen Reactions  . Macrobid [Nitrofurantoin Macrocrystal] Rash  . Azithromycin Other (See Comments)    unknown  . Doxycycline Other (See Comments)    unknown  . Escitalopram Oxalate Other (See Comments)    unknown  . Fioricet [Butalbital-Apap-Caffeine] Other (See Comments)    Drunk.  . Latex Other (See Comments)    unknown  . Levofloxacin Other (See Comments)    unknown  . Nsaids Other (See Comments)    This is not an allergy. The patient was told by Dr. Rockne Menghini to avoid NSAIDs and stop Vicoprofen in to 2014 to avoid nephrotoxicity.   Allergic reaction not listed on MAR.  Marland Kitchen Oxycodone Other (See Comments)    reaction to synthetic codeine  . Restasis [Cyclosporine] Other (See Comments)    unknown  . Sulfa Antibiotics Other (See Comments)    Reaction unknown  . Biaxin [Clarithromycin] Rash    With burning sensation    Consultations:  Wound care    Procedures/Studies: Dg Cervical Spine Complete  Result Date: 11/19/2015 CLINICAL DATA:  Posterior cervical spine pain after a fall today. History of scoliosis and osteoporosis. 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 Anterior subluxation of C4 on C5 and C7 on T1 similar prior study. Cervical spine is convex towards the right. This is also unchanged since prior study. Degenerative  changes throughout the cervical facet joints and at the open disc spaces. C1-2 articulation appears intact. No acute vertebral compression deformities. IMPRESSION: No acute displaced fractures are identified. Chronic degenerative changes, vertebral collections, and alignment are unchanged since previous study. Electronically Signed   By: Lucienne Capers M.D.   On:  11/19/2015 22:35   Ct Head Wo Contrast  Result Date: 12/11/2015 CLINICAL DATA:  Altered mental status. EXAM: CT HEAD WITHOUT CONTRAST TECHNIQUE: Contiguous axial images were obtained from the base of the skull through the vertex without intravenous contrast. COMPARISON:  CT scan of November 23, 2015. FINDINGS: Brain: Mild chronic ischemic white matter disease is noted. No mass effect or midline shift is noted. Ventricular size is within normal limits. There is no evidence of mass lesion, hemorrhage or acute infarction. Vascular: Atherosclerosis of carotid siphons is noted. Skull: Bony calvarium appears intact. Sinuses/Orbits: Paranasal sinuses appear normal. Other: None. IMPRESSION: Mild chronic ischemic white matter disease. No acute intracranial abnormality seen. Electronically Signed   By: Marijo Conception, M.D.   On: 12/11/2015 20:09   Ct Head Wo Contrast  Result Date: 11/23/2015 CLINICAL DATA:  Fall. Initial encounter. EXAM: CT HEAD WITHOUT CONTRAST CT CERVICAL SPINE WITHOUT CONTRAST TECHNIQUE: Multidetector CT imaging of the head and cervical spine was performed following the standard protocol without intravenous contrast. Multiplanar CT image reconstructions of the cervical spine were also generated. COMPARISON:  01/26/2015 FINDINGS: CT HEAD FINDINGS Brain: No evidence of acute infarction, hemorrhage, hydrocephalus, extra-axial collection or mass lesion/mass effect. Generalized atrophy. Mild for age chronic microvascular ischemic gliosis in the cerebral white matter. Small remote right cerebellar infarct Vascular: Atherosclerotic  calcification. Skull: Scalp lacerations or indentations without underlying fracture or opaque foreign body. Sinuses/Orbits: Bilateral cataract resection.  No acute finding. CT CERVICAL SPINE FINDINGS Alignment: Chronic C4-5 anterolisthesis, measuring 3 mm. There is new kyphotic deformity at C5-6 as described below. Skull base and vertebrae: C3-4 and C5-6 anterior cervical discectomy with solid bony fusion. C6-7 non segmentation. Interval fracture through the fused C5-6 vertebrae with kyphotic deformity. Fracture is still seen across the posterior elements at the level of the facets and lamina. The fracture appears chronic, with healed appearance to the vertebral body fracture. No acute fracture is detected. Soft tissues and spinal canal: No prevertebral fluid or swelling. No visible canal hematoma. Disc levels: Diffuse disc and facet degeneration. Bulky transverse ligamentous thickening without foramen magnum stenosis. Upper chest: Pleural based scarring seen at the left apex. IMPRESSION: 1. No evidence of acute intracranial or cervical spine injury. 2. Interval but remote fracture across the C5-6 fused vertebral bodies and posterior elements with healed kyphotic deformity. Incomplete posterior element fracture lines are still visible. 3. Previous C3-4 and C5-6 ACDF. C6-7 non segmentation. Severe adjacent segment facet degeneration at C4-5 with chronic 3 mm anterolisthesis. Electronically Signed   By: Monte Fantasia M.D.   On: 11/23/2015 17:29   Ct Cervical Spine Wo Contrast  Result Date: 11/23/2015 CLINICAL DATA:  Fall. Initial encounter. EXAM: CT HEAD WITHOUT CONTRAST CT CERVICAL SPINE WITHOUT CONTRAST TECHNIQUE: Multidetector CT imaging of the head and cervical spine was performed following the standard protocol without intravenous contrast. Multiplanar CT image reconstructions of the cervical spine were also generated. COMPARISON:  01/26/2015 FINDINGS: CT HEAD FINDINGS Brain: No evidence of acute  infarction, hemorrhage, hydrocephalus, extra-axial collection or mass lesion/mass effect. Generalized atrophy. Mild for age chronic microvascular ischemic gliosis in the cerebral white matter. Small remote right cerebellar infarct Vascular: Atherosclerotic calcification. Skull: Scalp lacerations or indentations without underlying fracture or opaque foreign body. Sinuses/Orbits: Bilateral cataract resection.  No acute finding. CT CERVICAL SPINE FINDINGS Alignment: Chronic C4-5 anterolisthesis, measuring 3 mm. There is new kyphotic deformity at C5-6 as described below. Skull base and vertebrae: C3-4 and C5-6 anterior cervical discectomy with solid bony fusion. C6-7 non  segmentation. Interval fracture through the fused C5-6 vertebrae with kyphotic deformity. Fracture is still seen across the posterior elements at the level of the facets and lamina. The fracture appears chronic, with healed appearance to the vertebral body fracture. No acute fracture is detected. Soft tissues and spinal canal: No prevertebral fluid or swelling. No visible canal hematoma. Disc levels: Diffuse disc and facet degeneration. Bulky transverse ligamentous thickening without foramen magnum stenosis. Upper chest: Pleural based scarring seen at the left apex. IMPRESSION: 1. No evidence of acute intracranial or cervical spine injury. 2. Interval but remote fracture across the C5-6 fused vertebral bodies and posterior elements with healed kyphotic deformity. Incomplete posterior element fracture lines are still visible. 3. Previous C3-4 and C5-6 ACDF. C6-7 non segmentation. Severe adjacent segment facet degeneration at C4-5 with chronic 3 mm anterolisthesis. Electronically Signed   By: Monte Fantasia M.D.   On: 11/23/2015 17:29   Dg Chest Port 1 View  Result Date: 12/11/2015 CLINICAL DATA:  Back pain. EXAM: PORTABLE CHEST 1 VIEW COMPARISON:  August 12, 2015 FINDINGS: The heart size borderline. The hila and mediastinum are unchanged. No  pulmonary nodules or masses. Mild interstitial prominence in the right chest, new in the interval. No other acute abnormalities. IMPRESSION: Mild interstitial prominence in the right lung, asymmetric to the left could represent asymmetric edema or an atypical infection. Recommend clinical correlation and follow-up to resolution. Electronically Signed   By: Dorise Bullion III M.D   On: 12/11/2015 16:16     Discharge Exam: Vitals:   12/14/15 2308 12/15/15 0606  BP: (!) 146/105 (!) 146/83  Pulse: 73 71  Resp:  17  Temp:  99.1 F (37.3 C)   Vitals:   12/14/15 1445 12/14/15 1936 12/14/15 2308 12/15/15 0606  BP: 122/69  (!) 146/105 (!) 146/83  Pulse: 66  73 71  Resp: 19   17  Temp: 98.7 F (37.1 C) 99.4 F (37.4 C)  99.1 F (37.3 C)  TempSrc: Oral Rectal  Rectal  SpO2: 98%  96% 98%  Weight:      Height:        General: Pt is alert, awake but disoriented, not in acute distress, cachectic  Cardiovascular: RRR, S1/S2 +, no rubs, no gallops Respiratory: CTA bilaterally, no wheezing, no rhonchi Abdominal: Soft, NT, ND, bowel sounds + Extremities: no edema, no cyanosis Skin: Stage 3 R decubiti ulcer   The results of significant diagnostics from this hospitalization (including imaging, microbiology, ancillary and laboratory) are listed below for reference.     Microbiology: Recent Results (from the past 240 hour(s))  Blood Culture (routine x 2)     Status: None (Preliminary result)   Collection Time: 12/11/15  3:40 PM  Result Value Ref Range Status   Specimen Description BLOOD BLOOD RIGHT FOREARM  Final   Special Requests IN PEDIATRIC BOTTLE [place]  Final   Culture   Final    NO GROWTH 4 DAYS Performed at [facility]    Report Status PENDING  Incomplete  Blood Culture (routine x 2)     Status: None (Preliminary result)   Collection Time: 12/11/15  3:49 PM  Result Value Ref Range Status   Specimen Description BLOOD RIGHT ANTECUBITAL  Final   Special Requests BOTTLES  DRAWN AEROBIC AND ANAEROBIC 5CC  Final   Culture   Final    NO GROWTH 4 DAYS Performed at [facility]    Report Status PENDING  Incomplete  Wound or Superficial Culture  Status: None   Collection Time: 12/11/15  7:08 PM  Result Value Ref Range Status   Specimen Description ULCER  Final   Special Requests NONE  Final   Gram Stain   Final    FEW WBC PRESENT,BOTH PMN AND MONONUCLEAR FEW GRAM VARIABLE ROD RARE GRAM POSITIVE COCCI    Culture   Final    ABUNDANT MORGANELLA MORGANII MODERATE STAPHYLOCOCCUS SPECIES (COAGULASE NEGATIVE) CALL MICROBIOLOGY LAB IF SENSITIVITIES ARE REQUIRED. Performed at [facility]    Report Status 12/14/2015 FINAL  Final   Organism ID, Bacteria MORGANELLA MORGANII  Final      Susceptibility   Morganella morganii - MIC*    AMPICILLIN >=32 RESISTANT Resistant     CEFAZOLIN >=64 RESISTANT Resistant     CEFEPIME <=1 SENSITIVE Sensitive     CEFTAZIDIME <=1 SENSITIVE Sensitive     CEFTRIAXONE <=1 SENSITIVE Sensitive     CIPROFLOXACIN 1 SENSITIVE Sensitive     GENTAMICIN <=1 SENSITIVE Sensitive     IMIPENEM 4 SENSITIVE Sensitive     TRIMETH/SULFA >=320 RESISTANT Resistant     AMPICILLIN/SULBACTAM 16 INTERMEDIATE Intermediate     PIP/TAZO <=4 SENSITIVE Sensitive     * ABUNDANT MORGANELLA MORGANII  MRSA PCR Screening     Status: None   Collection Time: 12/11/15  9:22 PM  Result Value Ref Range Status   MRSA by PCR NEGATIVE NEGATIVE Final    Comment:        The GeneXpert MRSA Assay (FDA approved for NASAL specimens only), is one component of a comprehensive MRSA colonization surveillance program. It is not intended to diagnose MRSA infection nor to guide or monitor treatment for MRSA infections.      Labs: BNP (last 3 results) No results for input(s): BNP in the last 8760 hours. Basic Metabolic Panel:  Recent Labs Lab 12/11/15 1549 12/12/15 0442 12/13/15 0439 12/15/15 0344  NA 140 138 143 142  K 3.8 4.2 3.3* 3.9  CL  105 109 114* 111  CO2 26 24 22 23   GLUCOSE 99 109* 104* 95  BUN 21* 20 16 15   CREATININE 0.74 0.89 0.84 0.74  CALCIUM 8.9 7.9* 8.5* 8.9  MG  --   --   --  1.5*   Liver Function Tests:  Recent Labs Lab 12/11/15 1549 12/12/15 0442 12/13/15 0439  AST 23 20 21   ALT 12* 9* 10*  ALKPHOS 79 58 61  BILITOT 0.5 0.6 0.9  PROT 6.7 5.3* 5.5*  ALBUMIN 3.4* 2.5* 2.7*   No results for input(s): LIPASE, AMYLASE in the last 168 hours. No results for input(s): AMMONIA in the last 168 hours. CBC:  Recent Labs Lab 12/11/15 1549 12/12/15 0442 12/13/15 0439 12/15/15 0344  WBC 9.2 10.3 9.0 6.1  NEUTROABS 7.1 7.9* 6.9  --   HGB 13.2 10.4* 10.8* 10.9*  HCT 41.1 32.6* 33.6* 34.1*  MCV 93.8 93.7 93.3 91.9  PLT 261 204 215 259   Cardiac Enzymes: No results for input(s): CKTOTAL, CKMB, CKMBINDEX, TROPONINI in the last 168 hours. BNP: Invalid input(s): POCBNP CBG: No results for input(s): GLUCAP in the last 168 hours. D-Dimer No results for input(s): DDIMER in the last 72 hours. Hgb A1c No results for input(s): HGBA1C in the last 72 hours. Lipid Profile No results for input(s): CHOL, HDL, LDLCALC, TRIG, CHOLHDL, LDLDIRECT in the last 72 hours. Thyroid function studies No results for input(s): TSH, T4TOTAL, T3FREE, THYROIDAB in the last 72 hours.  Invalid input(s): FREET3 Anemia work up No  results for input(s): VITAMINB12, FOLATE, FERRITIN, TIBC, IRON, RETICCTPCT in the last 72 hours. Urinalysis    Component Value Date/Time   COLORURINE YELLOW 12/11/2015 1810   APPEARANCEUR CLEAR 12/11/2015 1810   LABSPEC 1.012 12/11/2015 1810   PHURINE 7.5 12/11/2015 1810   GLUCOSEU NEGATIVE 12/11/2015 1810   HGBUR NEGATIVE 12/11/2015 1810   BILIRUBINUR NEGATIVE 12/11/2015 1810   BILIRUBINUR neg 06/20/2013 1736   KETONESUR NEGATIVE 12/11/2015 1810   PROTEINUR NEGATIVE 12/11/2015 1810   UROBILINOGEN 0.2 12/25/2014 1620   NITRITE NEGATIVE 12/11/2015 1810   LEUKOCYTESUR NEGATIVE 12/11/2015 1810    Sepsis Labs Invalid input(s): PROCALCITONIN,  WBC,  LACTICIDVEN Microbiology Recent Results (from the past 240 hour(s))  Blood Culture (routine x 2)     Status: None (Preliminary result)   Collection Time: 12/11/15  3:40 PM  Result Value Ref Range Status   Specimen Description BLOOD BLOOD RIGHT FOREARM  Final   Special Requests IN PEDIATRIC BOTTLE [place]  Final   Culture   Final    NO GROWTH 4 DAYS Performed at [facility]    Report Status PENDING  Incomplete  Blood Culture (routine x 2)     Status: None (Preliminary result)   Collection Time: 12/11/15  3:49 PM  Result Value Ref Range Status   Specimen Description BLOOD RIGHT ANTECUBITAL  Final   Special Requests BOTTLES DRAWN AEROBIC AND ANAEROBIC 5CC  Final   Culture   Final    NO GROWTH 4 DAYS Performed at [facility]    Report Status PENDING  Incomplete  Wound or Superficial Culture     Status: None   Collection Time: 12/11/15  7:08 PM  Result Value Ref Range Status   Specimen Description ULCER  Final   Special Requests NONE  Final   Gram Stain   Final    FEW WBC PRESENT,BOTH PMN AND MONONUCLEAR FEW GRAM VARIABLE ROD RARE GRAM POSITIVE COCCI    Culture   Final    ABUNDANT MORGANELLA MORGANII MODERATE STAPHYLOCOCCUS SPECIES (COAGULASE NEGATIVE) CALL MICROBIOLOGY LAB IF SENSITIVITIES ARE REQUIRED. Performed at [facility]    Report Status 12/14/2015 FINAL  Final   Organism ID, Bacteria MORGANELLA MORGANII  Final      Susceptibility   Morganella morganii - MIC*    AMPICILLIN >=32 RESISTANT Resistant     CEFAZOLIN >=64 RESISTANT Resistant     CEFEPIME <=1 SENSITIVE Sensitive     CEFTAZIDIME <=1 SENSITIVE Sensitive     CEFTRIAXONE <=1 SENSITIVE Sensitive     CIPROFLOXACIN 1 SENSITIVE Sensitive     GENTAMICIN <=1 SENSITIVE Sensitive     IMIPENEM 4 SENSITIVE Sensitive     TRIMETH/SULFA >=320 RESISTANT Resistant     AMPICILLIN/SULBACTAM 16 INTERMEDIATE Intermediate     PIP/TAZO <=4 SENSITIVE  Sensitive     * ABUNDANT MORGANELLA MORGANII  MRSA PCR Screening     Status: None   Collection Time: 12/11/15  9:22 PM  Result Value Ref Range Status   MRSA by PCR NEGATIVE NEGATIVE Final    Comment:        The GeneXpert MRSA Assay (FDA approved for NASAL specimens only), is one component of a comprehensive MRSA colonization surveillance program. It is not intended to diagnose MRSA infection nor to guide or monitor treatment for MRSA infections.     Time coordinating discharge: Over 30 minutes  SIGNED:  Chipper Oman, MD  [name] 12/15/2015, 12:55 PM Pager   If 7PM-7AM, please contact night-coverage [email or web address] Password [email or web address]

## 2015-12-15 NOTE — Care Management Important Message (Signed)
Important Message  Patient Details  Name: Taylor Roth MRN: [id number] Date of Birth: 07-Dec-1928   Medicare Important Message Given:  Yes    Camillo Flaming 12/15/2015, 11:02 AMImportant Message  Patient Details  Name: Taylor Roth MRN: [id number] Date of Birth: [birth year]   Medicare Important Message Given:  Yes    Camillo Flaming 12/15/2015, 11:02 AM
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
## 2015-12-15 NOTE — Progress Notes (Signed)
PT Cancellation Note  Patient Details Name: Taylor Roth MRN: [id number] DOB: [birth year]   Cancelled Treatment:    Reason Eval/Treat Not Completed: Other (comment) Per nursing staff, pt more confused and agitated today.   Grethel Zenk,KATHrine E 12/15/2015, 11:03 AM Carmelia Bake, PT, DPT 12/15/2015 Pager: [phone]

## 2015-12-15 NOTE — Clinical Social Work Placement (Signed)
Patient is set to discharge to [facility] today. Patient & daughter, Taylor Roth made aware. Discharge packet given to RN, Stanton Kidney. [facility] called for transport to pickup at 1:30pm.     Raynaldo Opitz, [place] Social Worker cell #: [phone]    CLINICAL SOCIAL WORK PLACEMENT  NOTE  Date:  12/15/2015  Patient Details  Name: Taylor Roth MRN: [id number] Date of Birth: [birth year]  Clinical Social Work is seeking post-discharge placement for this patient at the [place] level of care (*CSW will initial, date and re-position this form in  chart as items are completed):  Yes   Patient/family provided with [place] Work Department's list of facilities offering this level of care within the geographic area requested by the patient (or if unable, by the patient's family).  Yes   Patient/family informed of their freedom to choose among providers that offer the needed level of care, that participate in Medicare, Medicaid or managed care program needed by the patient, have an available bed and are willing to accept the patient.  Yes   Patient/family informed of [facility]'s ownership interest in [facility], as well as of the fact that they are under no obligation to receive care at these facilities.  PASRR submitted to EDS on       PASRR number received on       Existing PASRR number confirmed on 12/15/15     FL2 transmitted to all facilities in geographic area requested by pt/family on       FL2 transmitted to all facilities within larger geographic area on       Patient informed that his/her managed care company has contracts with or will negotiate with certain facilities, including the following:        Yes   Patient/family informed of bed offers received.  Patient chooses bed at [facility]     Physician recommends and patient chooses bed at      Patient to be transferred  to [facility] on 12/15/15.  Patient to be transferred to facility by [facility]     Patient family notified on 12/15/15 of transfer.  Name of family member notified:  patient's daughter, Taylor Roth via phone     PHYSICIAN       Additional Comment:    _______________________________________________ Standley Brooking, LCSW 12/15/2015, 1:38 PM

## 2015-12-16 ENCOUNTER — Non-Acute Institutional Stay (SKILLED_NURSING_FACILITY): Payer: Medicare Other | Admitting: Internal Medicine

## 2015-12-16 ENCOUNTER — Encounter: Payer: Self-pay | Admitting: Internal Medicine

## 2015-12-16 DIAGNOSIS — F329 Major depressive disorder, single episode, unspecified: Secondary | ICD-10-CM

## 2015-12-16 DIAGNOSIS — K219 Gastro-esophageal reflux disease without esophagitis: Secondary | ICD-10-CM | POA: Diagnosis not present

## 2015-12-16 DIAGNOSIS — F32A Depression, unspecified: Secondary | ICD-10-CM

## 2015-12-16 DIAGNOSIS — G9340 Encephalopathy, unspecified: Secondary | ICD-10-CM | POA: Diagnosis not present

## 2015-12-16 DIAGNOSIS — L89213 Pressure ulcer of right hip, stage 3: Secondary | ICD-10-CM | POA: Diagnosis not present

## 2015-12-16 DIAGNOSIS — D638 Anemia in other chronic diseases classified elsewhere: Secondary | ICD-10-CM | POA: Diagnosis not present

## 2015-12-16 DIAGNOSIS — J189 Pneumonia, unspecified organism: Secondary | ICD-10-CM

## 2015-12-16 DIAGNOSIS — F419 Anxiety disorder, unspecified: Secondary | ICD-10-CM

## 2015-12-16 DIAGNOSIS — F39 Unspecified mood [affective] disorder: Secondary | ICD-10-CM | POA: Diagnosis not present

## 2015-12-16 LAB — CULTURE, BLOOD (ROUTINE X 2)
CULTURE: NO GROWTH
Culture: NO GROWTH

## 2015-12-16 NOTE — Progress Notes (Signed)
: Provider:  Noah Roth. Taylor Coil, MD Location:  [place] Room Number: 100P Place of Service:  SNF (31)  PCP: Pcp Not In System Patient Care Team: Pcp Not In System as PCP - General Taylor Ada, MD (Gastroenterology) [name], NP (Obstetrics and Gynecology)  Extended Emergency Contact Information Primary Emergency Contact: Taylor,Roth Address: [place], [place] Phone: [phone] Mobile Phone: [phone] Relation: Daughter Secondary Emergency Contact: Taylor Roth [place] Phone: [phone] Mobile Phone: [phone] Relation: Daughter     Allergies: Macrobid [nitrofurantoin macrocrystal]; Azithromycin; Doxycycline; Escitalopram oxalate; Fioricet [butalbital-apap-caffeine]; Latex; Levofloxacin; Nsaids; Oxycodone; Restasis [cyclosporine]; Sulfa antibiotics; and Biaxin [clarithromycin]  Chief Complaint  Patient presents with  . New Admit To SNF    Admit to Facility    HPI: Patient is 82 y.o. female with  "benzo/narcotics abuse, anxiety, depression, CKD, COPD, GERD, migraines, osteoporosis, scoliosis, substance abuse, vertigo who was brought to the ED via EMS due to altered mental status and fever. The patient was unable to provide further history, but her daughter says that her mental statushas been decreasing rapidly over the past three weeks along with her appetite and nutrition. Patient was noticed to be febrile and hypotensive (80/59 mmHg) at [facility], so the facility referred her to the ED via EMS. She was hypoxic at 85 % on arrival tothe ED. She was admitted to [facility] from 10/29-11/2 for treatment of HCAP. IVF and antibiotics were given, patient BP improved. Patient mental status close to baseline, although remained disoriented due to dementia." Pt is admitted to SNF for generalized weakness for OT/PT. While at SNF pt wil be followed for  depression, tx with remeron, anxiety, tx with tranxene and mood disoreder, tx with seroquel and depakote.  Past Medical History:  Diagnosis Date  . Abuse    benzos/narcotics  . Anxiety   . Arterial tortuosity (aorta) 12/11/2012  . Chronic kidney disease, stage IV (severe) ([place]) 12/11/2012  . Chronic mental illness   . Colitis   . COPD (chronic obstructive pulmonary disease) ([place])   . Depression   . Dysphagia 01/11/2015  . GERD (gastroesophageal reflux disease)   . Insomnia, unspecified   . Migraines   . Nasal bone fracture 03/30/2014  . Osteoporosis   . Schatzki's ring 01/14/2015  . Scoliosis   . Substance abuse    Benzos and Narcotics, she does not get any of those medicines from [facility], we have empahtically told her that.   . Vertigo   . Vitamin B deficiency   . Weight loss     Past Surgical History:  Procedure Laterality Date  . ABDOMINAL HYSTERECTOMY    . CATARACT EXTRACTION    . CHOLECYSTECTOMY    . ESOPHAGOGASTRODUODENOSCOPY (EGD) WITH PROPOFOL N/A 01/13/2015   Procedure: ESOPHAGOGASTRODUODENOSCOPY (EGD) WITH PROPOFOL;  Surgeon: [name], MD;  Location: [facility] END[facility];  Service: Endoscopy;  Laterality: N/A;  . PARTIAL HYSTERECTOMY        Medication List       Accurate as of 12/16/15 10:58 AM. Always use your most recent med list.          acetaminophen 325 MG tablet Commonly known as:  TYLENOL Take 650 mg by mouth every 6 (six) hours as needed for headache.   albuterol 108 (90 Base) MCG/ACT inhaler Commonly known as:  PROVENTIL HFA;VENTOLIN HFA Inhale 1 puff into the lungs every  6 (six) hours as needed for shortness of breath.   ASPERCREME 10 % Lotn Generic drug:  Trolamine Salicylate Apply 1 application topically 2 (two) times daily as needed (for painful areas).   aspirin EC 81 MG tablet Take 81 mg by mouth daily.   Biotin 5000 MCG Caps Take 1 capsule by mouth daily with breakfast.   cefPROZIL 500 MG tablet Commonly known as:  CEFZIL Take 1  tablet (500 mg total) by mouth 2 (two) times daily.   clorazepate 3.75 MG tablet Commonly known as:  TRANXENE Take 1 tablet (3.75 mg total) by mouth 3 (three) times daily.   collagenase ointment Commonly known as:  SANTYL Apply topically daily.   Cranberry 425 MG Caps Take 425 mg by mouth 2 (two) times daily.   divalproex 500 MG 24 hr tablet Commonly known as:  DEPAKOTE ER Take 500 mg by mouth daily with breakfast.   docusate sodium 100 MG capsule Commonly known as:  COLACE Take 100 mg by mouth 2 (two) times daily.   dorzolamide-timolol 22.3-6.8 MG/ML ophthalmic solution Commonly known as:  COSOPT Place 1 drop into both eyes 2 (two) times daily.   feeding supplement (ENSURE ENLIVE) Liqd Take 237 mLs by mouth 2 (two) times daily between meals.   fludrocortisone 0.1 MG tablet Commonly known as:  FLORINEF Take 0.1 mg by mouth every morning.   HYDROcodone-acetaminophen 7.5-325 MG tablet Commonly known as:  NORCO Take 1 tablet by mouth every 6 (six) hours as needed for severe pain.   hydrocortisone 2.5 % rectal cream Commonly known as:  ANUSOL-HC Place 1 application rectally 2 (two) times daily as needed for hemorrhoids or itching.   lactulose 10 GM/15ML solution Commonly known as:  CHRONULAC Take 15 mLs (10 g total) by mouth daily as needed for severe constipation.   lidocaine 5 % Commonly known as:  LIDODERM Place 1 patch onto the skin every 12 (twelve) hours as needed (for pain). Remove & Discard patch within 12 hours or as directed by MD   metoprolol tartrate 25 MG tablet Commonly known as:  LOPRESSOR Take 25 mg by mouth 2 (two) times daily. 0800 and 1800   mineral oil-hydrophilic petrolatum ointment Apply 1 application topically daily.   mirtazapine 45 MG tablet Commonly known as:  REMERON Take 45 mg by mouth at bedtime.   nitroGLYCERIN 0.4 MG SL tablet Commonly known as:  NITROSTAT Place 0.4 mg under the tongue every 5 (five) minutes as needed for chest  pain. May use 3 times   nystatin 100000 UNIT/ML suspension Commonly known as:  MYCOSTATIN Take 5 mLs by mouth 3 (three) times daily as needed (mouth pain).   pantoprazole 40 MG tablet Commonly known as:  PROTONIX Take 1 tablet (40 mg total) by mouth daily at 6 (six) AM.   polyethylene glycol packet Commonly known as:  MIRALAX / GLYCOLAX Take 17 g by mouth daily.   potassium chloride SA 20 MEQ tablet Commonly known as:  K-DUR,KLOR-CON Take 2 tablets (40 mEq total) by mouth daily.   QUEtiapine Fumarate 150 MG 24 hr tablet Commonly known as:  SEROQUEL XR Take 1 tablet (150 mg total) by mouth at bedtime.   ranitidine 150 MG tablet Commonly known as:  ZANTAC Take 150 mg by mouth 2 (two) times daily as needed for heartburn.   senna 8.6 MG tablet Commonly known as:  SENOKOT Take 2 tablets by mouth 2 (two) times daily as needed for constipation.   travoprost (benzalkonium) 0.004 % ophthalmic  solution Commonly known as:  TRAVATAN Place 1 drop into both eyes at bedtime.   Vitamin D 1000 units capsule Take 1,000 Units by mouth daily with breakfast.       Meds ordered this encounter  Medications  . HYDROcodone-acetaminophen (NORCO) 7.5-325 MG tablet    Sig: Take 1 tablet by mouth every 6 (six) hours as needed for severe pain.  . metoprolol tartrate (LOPRESSOR) 25 MG tablet    Sig: Take 25 mg by mouth 2 (two) times daily. 0800 and 1800    Immunization History  Administered Date(s) Administered  . Influenza Split 11/13/2011  . Influenza-Unspecified 02/13/2008, 11/30/2013  . PPD Test 12/15/2015  . Pneumococcal-Unspecified 02/13/2008  . Td 02/12/2009    Social History  Substance Use Topics  . Smoking status: Former Smoker    Packs/day: 0.50    Types: Cigarettes  . Smokeless tobacco: Never Used  . Alcohol use No    Family history is   Family History  Problem Relation Age of Onset  . Stroke Mother   . Diabetes Mother   . Heart disease Mother   . Hearing loss  Mother   . Cancer Father 49    esophageal      Review of Systems  unav ble to fully participate 2/2 dementia; nursing without concerns     Vitals:   12/16/15 0833  BP: (!) 149/56  Pulse: 60  Resp: 20  Temp: 98.4 F (36.9 C)    SpO2 Readings from Last 1 Encounters:  12/15/15 98%   Body mass index is 20.04 kg/m.     Physical Exam  GENERAL APPEARANCE: Alert, min conversant,  No acute distress.  SKIN: No diaphoresis rash HEAD: Normocephalic, atraumatic  EYES: Conjunctiva/lids clear. Pupils round, reactive. EOMs intact.  EARS: External exam WNL, canals clear. Hearing grossly normal.  NOSE: No deformity or discharge.  MOUTH/THROAT: Lips w/o lesions  RESPIRATORY: Breathing is even, unlabored. Lung sounds are clear   CARDIOVASCULAR: Heart RRR no murmurs, rubs or gallops. No peripheral edema.   GASTROINTESTINAL: Abdomen is soft, non-tender, not distended w/ normal bowel sounds. GENITOURINARY: Bladder non tender, not distended  MUSCULOSKELETAL: No abnormal joints or musculature NEUROLOGIC:  Cranial nerves 2-12 grossly intact. Moves all extremities  PSYCHIATRIC: dementia, no behavioral issues  Patient Active Problem List   Diagnosis Date Noted  . HCAP (healthcare-associated pneumonia) 12/11/2015  . Decubitus ulcer of hip, stage 3 ([place]) 12/11/2015  . Ankle pain   . Encephalopathy   . Malnutrition of moderate degree 01/29/2015  . Altered mental status 01/26/2015  . Fever 01/26/2015  . Nausea and vomiting 01/17/2015  . Esophageal dysmotilities   . Fever, unspecified   . Metabolic encephalopathy   . Urinary tract infectious disease   . Aspiration pneumonia ([place]) 01/16/2015  . Weakness 01/14/2015  . Schatzki's ring 01/14/2015  . CKD (chronic kidney disease), stage III 01/14/2015  . Esophagus disorder   . Dysphagia 01/11/2015  . UTI (lower urinary tract infection) 03/30/2014  . Hearing loss, sensorineural 03/04/2014  . Nasal lesion 03/04/2014  . Unspecified  constipation 12/12/2012  . Hypokalemia 12/11/2012  . Orthostatic hypotension 12/11/2012  . Arterial tortuosity (aorta) 12/11/2012  . Protein-calorie malnutrition, severe ([place]) 12/10/2012  . Decreased rectal sphincter tone 11/21/2012  . Idiopathic scoliosis 07/08/2012  . Macular degeneration 06/26/2011  . Vision disturbance 06/26/2011  . Glaucoma 06/26/2011  . Anemia 04/17/2011  . Drug-seeking behavior 02/26/2011  . Osteoporosis 02/26/2011  . Depression   . Anxiety   . COPD (chronic  obstructive pulmonary disease) ([place])   . GERD (gastroesophageal reflux disease)   . Migraines   . Insomnia, unspecified       Labs reviewed: Basic Metabolic Panel:    Component Value Date/Time   NA 142 12/15/2015 0344   NA 142 12/15/2015   K 3.9 12/15/2015 0344   CL 111 12/15/2015 0344   CO2 23 12/15/2015 0344   GLUCOSE 95 12/15/2015 0344   BUN 15 12/15/2015 0344   BUN 15 12/15/2015   CREATININE 0.74 12/15/2015 0344   CREATININE 0.85 11/09/2013 1135   CALCIUM 8.9 12/15/2015 0344   PROT 5.5 (L) 12/13/2015 0439   ALBUMIN 2.7 (L) 12/13/2015 0439   AST 21 12/13/2015 0439   ALT 10 (L) 12/13/2015 0439   ALKPHOS 61 12/13/2015 0439   BILITOT 0.9 12/13/2015 0439   GFRNONAA >60 12/15/2015 0344   GFRNONAA 64 11/09/2013 1135   GFRAA >60 12/15/2015 0344   GFRAA 74 11/09/2013 1135     Recent Labs  01/16/15 2059  01/30/15 1006 11/23/15 1631  12/12/15 0442 12/13/15 12/13/15 0439 12/15/15 12/15/15 0344  NA 143  < > 142 144  < > 138 143 143 142 142  K 3.0*  < > 3.9 4.6  < > 4.2 3.3* 3.3*  --  3.9  CL 108  < > 105 110  < > 109  --  114*  --  111  CO2 27  < > 27 27  < > 24  --  22  --  23  GLUCOSE 134*  < > 108* 126*  < > 109*  --  104*  --  95  BUN 17  < > 19 28*  < > 20 16 16 15 15   CREATININE 0.66  < > 0.64 0.93  < > 0.89 0.8 0.84 0.7 0.74  CALCIUM 8.5*  < > 9.0 9.0  < > 7.9*  --  8.5*  --  8.9  MG 1.8  < > 1.9 2.1  --   --   --   --   --  1.5*  PHOS 3.3  --   --   --   --   --   --   --    --   --   < > = values in this interval not displayed. Liver Function Tests:  Recent Labs  12/11/15 1549 12/12/15 12/12/15 0442 12/13/15 0439  AST 23 20 20 21   ALT 12* 9 9* 10*  ALKPHOS 79 58  58 58 61  BILITOT 0.5  --  0.6 0.9  PROT 6.7  --  5.3* 5.5*  ALBUMIN 3.4*  --  2.5* 2.7*    Recent Labs  01/11/15 1430 01/16/15 2059 01/26/15 1547  LIPASE 23 23 21    No results for input(s): AMMONIA in the last 8760 hours. CBC:  Recent Labs  12/12/15 0442 12/13/15 12/13/15 0439 12/15/15 12/15/15 0344  WBC 10.3 9.0 9.0 6.1 6.1  NEUTROABS 7.9* 7 6.9  --   --   HGB 10.4* 10.8* 10.8*  --  10.9*  HCT 32.6* 34* 33.6*  --  34.1*  MCV 93.7  --  93.3  --  91.9  PLT 204 215 215  --  259   Lipid No results for input(s): CHOL, HDL, LDLCALC, TRIG in the last 8760 hours.  Cardiac Enzymes:  Recent Labs  01/26/15 1557  CKTOTAL 62   BNP: No results for input(s): BNP in the last 8760 hours. No results found for: [facility]  No results found for: HGBA1C Lab Results  Component Value Date   TSH 0.509 06/26/2011   No results found for: VITAMINB12 No results found for: FOLATE No results found for: IRON, TIBC, FERRITIN  Imaging and Procedures obtained prior to SNF admission: Ct Head Wo Contrast  Result Date: 12/11/2015 CLINICAL DATA:  Altered mental status. EXAM: CT HEAD WITHOUT CONTRAST TECHNIQUE: Contiguous axial images were obtained from the base of the skull through the vertex without intravenous contrast. COMPARISON:  CT scan of November 23, 2015. FINDINGS: Brain: Mild chronic ischemic white matter disease is noted. No mass effect or midline shift is noted. Ventricular size is within normal limits. There is no evidence of mass lesion, hemorrhage or acute infarction. Vascular: Atherosclerosis of carotid siphons is noted. Skull: Bony calvarium appears intact. Sinuses/Orbits: Paranasal sinuses appear normal. Other: None. IMPRESSION: Mild chronic ischemic white matter disease. No acute  intracranial abnormality seen. Electronically Signed   By: Marijo Conception, M.D.   On: 12/11/2015 20:09   Dg Chest Port 1 View  Result Date: 12/11/2015 CLINICAL DATA:  Back pain. EXAM: PORTABLE CHEST 1 VIEW COMPARISON:  August 12, 2015 FINDINGS: The heart size borderline. The hila and mediastinum are unchanged. No pulmonary nodules or masses. Mild interstitial prominence in the right chest, new in the interval. No other acute abnormalities. IMPRESSION: Mild interstitial prominence in the right lung, asymmetric to the left could represent asymmetric edema or an atypical infection. Recommend clinical correlation and follow-up to resolution. Electronically Signed   By: Dorise Bullion III M.D   On: 12/11/2015 16:16     Not all labs, radiology exams or other studies done during hospitalization come through on my EPIC note; however they are reviewed by me.    Assessment and Plan  HCAP - Pneumonia appears atypical on CXR - Blood Cx NG up to date; MRSA swab negative;strep pneumoniae urinary antigen negative. SNF - cont cefzil for 10 day course  DECUBITUS ULCER OF HIP, STAGE 3- grew out morganella morgani;tx with cefzil SNF - cont cefzil for 10 days;wound care nurse to follow  ENCEPHALOPATHY-CT head neg; felt 2/2 dementia and  polypharmacy  DEPRESSION SNF - cnt remeron 45 mg qHS  ANXIETY SNF - tranxene dose changed to to 3.5 mg TID; plan to cont and monitor  MOOD DISORDER SNF - not reported as uncontrolled;cont seroquel XR 150 mg and keppra 500 mg q am  ANEMIA SNF - Hb  Stable, last 10.9; will f/u CBC  GERD SNF -cont protonix 40 mg  daily and zantac prn     Time spent > 45 min;> 50% of time with patient was spent reviewing records, labs, tests and studies, counseling and developing plan of care  Webb Silversmith D. Taylor Coil, MD

## 2015-12-17 ENCOUNTER — Encounter: Payer: Self-pay | Admitting: Internal Medicine

## 2015-12-17 DIAGNOSIS — F319 Bipolar disorder, unspecified: Secondary | ICD-10-CM | POA: Insufficient documentation

## 2015-12-19 ENCOUNTER — Inpatient Hospital Stay (HOSPITAL_COMMUNITY): Payer: Medicare Other

## 2015-12-19 ENCOUNTER — Inpatient Hospital Stay (HOSPITAL_COMMUNITY)
Admission: AD | Admit: 2015-12-19 | Discharge: 2015-12-31 | DRG: 469 | Disposition: A | Discharge status: Skilled Nursing Facility | Payer: Medicare Other | Source: Ambulatory Visit | Attending: Family Medicine | Admitting: Family Medicine

## 2015-12-19 ENCOUNTER — Non-Acute Institutional Stay (SKILLED_NURSING_FACILITY): Payer: Medicare Other | Admitting: Internal Medicine

## 2015-12-19 ENCOUNTER — Encounter (HOSPITAL_COMMUNITY): Payer: Self-pay

## 2015-12-19 ENCOUNTER — Encounter: Payer: Self-pay | Admitting: Internal Medicine

## 2015-12-19 DIAGNOSIS — N184 Chronic kidney disease, stage 4 (severe): Secondary | ICD-10-CM | POA: Diagnosis present

## 2015-12-19 DIAGNOSIS — F319 Bipolar disorder, unspecified: Secondary | ICD-10-CM | POA: Diagnosis present

## 2015-12-19 DIAGNOSIS — E870 Hyperosmolality and hypernatremia: Secondary | ICD-10-CM | POA: Diagnosis present

## 2015-12-19 DIAGNOSIS — I129 Hypertensive chronic kidney disease with stage 1 through stage 4 chronic kidney disease, or unspecified chronic kidney disease: Secondary | ICD-10-CM | POA: Diagnosis present

## 2015-12-19 DIAGNOSIS — Z882 Allergy status to sulfonamides status: Secondary | ICD-10-CM | POA: Diagnosis not present

## 2015-12-19 DIAGNOSIS — M810 Age-related osteoporosis without current pathological fracture: Secondary | ICD-10-CM | POA: Diagnosis present

## 2015-12-19 DIAGNOSIS — E43 Unspecified severe protein-calorie malnutrition: Secondary | ICD-10-CM | POA: Diagnosis present

## 2015-12-19 DIAGNOSIS — M8000XA Age-related osteoporosis with current pathological fracture, unspecified site, initial encounter for fracture: Secondary | ICD-10-CM | POA: Diagnosis not present

## 2015-12-19 DIAGNOSIS — Z888 Allergy status to other drugs, medicaments and biological substances status: Secondary | ICD-10-CM

## 2015-12-19 DIAGNOSIS — H409 Unspecified glaucoma: Secondary | ICD-10-CM | POA: Diagnosis present

## 2015-12-19 DIAGNOSIS — J441 Chronic obstructive pulmonary disease with (acute) exacerbation: Secondary | ICD-10-CM | POA: Diagnosis present

## 2015-12-19 DIAGNOSIS — N183 Chronic kidney disease, stage 3 unspecified: Secondary | ICD-10-CM | POA: Diagnosis present

## 2015-12-19 DIAGNOSIS — E876 Hypokalemia: Secondary | ICD-10-CM | POA: Diagnosis present

## 2015-12-19 DIAGNOSIS — Z885 Allergy status to narcotic agent status: Secondary | ICD-10-CM

## 2015-12-19 DIAGNOSIS — Z6820 Body mass index (BMI) 20.0-20.9, adult: Secondary | ICD-10-CM

## 2015-12-19 DIAGNOSIS — F0390 Unspecified dementia without behavioral disturbance: Secondary | ICD-10-CM | POA: Diagnosis present

## 2015-12-19 DIAGNOSIS — J449 Chronic obstructive pulmonary disease, unspecified: Secondary | ICD-10-CM | POA: Diagnosis not present

## 2015-12-19 DIAGNOSIS — S72001A Fracture of unspecified part of neck of right femur, initial encounter for closed fracture: Principal | ICD-10-CM | POA: Diagnosis present

## 2015-12-19 DIAGNOSIS — Z66 Do not resuscitate: Secondary | ICD-10-CM | POA: Diagnosis present

## 2015-12-19 DIAGNOSIS — Z8249 Family history of ischemic heart disease and other diseases of the circulatory system: Secondary | ICD-10-CM

## 2015-12-19 DIAGNOSIS — L89223 Pressure ulcer of left hip, stage 3: Secondary | ICD-10-CM | POA: Diagnosis present

## 2015-12-19 DIAGNOSIS — G9341 Metabolic encephalopathy: Secondary | ICD-10-CM | POA: Diagnosis present

## 2015-12-19 DIAGNOSIS — Z91040 Latex allergy status: Secondary | ICD-10-CM

## 2015-12-19 DIAGNOSIS — S72001D Fracture of unspecified part of neck of right femur, subsequent encounter for closed fracture with routine healing: Secondary | ICD-10-CM

## 2015-12-19 DIAGNOSIS — Y92129 Unspecified place in nursing home as the place of occurrence of the external cause: Secondary | ICD-10-CM

## 2015-12-19 DIAGNOSIS — S72009A Fracture of unspecified part of neck of unspecified femur, initial encounter for closed fracture: Secondary | ICD-10-CM

## 2015-12-19 DIAGNOSIS — J690 Pneumonitis due to inhalation of food and vomit: Secondary | ICD-10-CM | POA: Diagnosis not present

## 2015-12-19 DIAGNOSIS — Z881 Allergy status to other antibiotic agents status: Secondary | ICD-10-CM | POA: Diagnosis not present

## 2015-12-19 DIAGNOSIS — R5082 Postprocedural fever: Secondary | ICD-10-CM | POA: Diagnosis not present

## 2015-12-19 DIAGNOSIS — Z01818 Encounter for other preprocedural examination: Secondary | ICD-10-CM

## 2015-12-19 DIAGNOSIS — W1830XA Fall on same level, unspecified, initial encounter: Secondary | ICD-10-CM | POA: Diagnosis present

## 2015-12-19 DIAGNOSIS — R509 Fever, unspecified: Secondary | ICD-10-CM

## 2015-12-19 DIAGNOSIS — K219 Gastro-esophageal reflux disease without esophagitis: Secondary | ICD-10-CM | POA: Diagnosis present

## 2015-12-19 DIAGNOSIS — Z87891 Personal history of nicotine dependence: Secondary | ICD-10-CM | POA: Diagnosis not present

## 2015-12-19 DIAGNOSIS — G9340 Encephalopathy, unspecified: Secondary | ICD-10-CM | POA: Diagnosis not present

## 2015-12-19 DIAGNOSIS — M6281 Muscle weakness (generalized): Secondary | ICD-10-CM

## 2015-12-19 DIAGNOSIS — L89203 Pressure ulcer of unspecified hip, stage 3: Secondary | ICD-10-CM | POA: Diagnosis present

## 2015-12-19 DIAGNOSIS — B370 Candidal stomatitis: Secondary | ICD-10-CM | POA: Diagnosis present

## 2015-12-19 HISTORY — DX: Unspecified asthma, uncomplicated: J45.909

## 2015-12-19 HISTORY — DX: Fracture of unspecified part of neck of right femur, subsequent encounter for closed fracture with routine healing: S72.001D

## 2015-12-19 LAB — CBC WITH DIFFERENTIAL/PLATELET
BASOS ABS: 0 10*3/uL (ref 0.0–0.1)
BASOS PCT: 0 %
EOS PCT: 1 %
Eosinophils Absolute: 0.1 10*3/uL (ref 0.0–0.7)
HCT: 35.2 % — ABNORMAL LOW (ref 36.0–46.0)
Hemoglobin: 10.9 g/dL — ABNORMAL LOW (ref 12.0–15.0)
Lymphocytes Relative: 17 %
Lymphs Abs: 1.4 10*3/uL (ref 0.7–4.0)
MCH: 29.1 pg (ref 26.0–34.0)
MCHC: 31 g/dL (ref 30.0–36.0)
MCV: 93.9 fL (ref 78.0–100.0)
MONO ABS: 1 10*3/uL (ref 0.1–1.0)
Monocytes Relative: 13 %
NEUTROS ABS: 5.4 10*3/uL (ref 1.7–7.7)
Neutrophils Relative %: 69 %
PLATELETS: 285 10*3/uL (ref 150–400)
RBC: 3.75 MIL/uL — AB (ref 3.87–5.11)
RDW: 13.9 % (ref 11.5–15.5)
WBC: 7.8 10*3/uL (ref 4.0–10.5)

## 2015-12-19 LAB — URINALYSIS, ROUTINE W REFLEX MICROSCOPIC
Bilirubin Urine: NEGATIVE
GLUCOSE, UA: NEGATIVE mg/dL
HGB URINE DIPSTICK: NEGATIVE
KETONES UR: NEGATIVE mg/dL
LEUKOCYTES UA: NEGATIVE
Nitrite: NEGATIVE
PROTEIN: NEGATIVE mg/dL
Specific Gravity, Urine: 1.023 (ref 1.005–1.030)
pH: 6.5 (ref 5.0–8.0)

## 2015-12-19 LAB — COMPREHENSIVE METABOLIC PANEL
ALBUMIN: 2.9 g/dL — AB (ref 3.5–5.0)
ALT: 46 U/L (ref 14–54)
AST: 61 U/L — AB (ref 15–41)
Alkaline Phosphatase: 52 U/L (ref 38–126)
Anion gap: 7 (ref 5–15)
BUN: 29 mg/dL — AB (ref 6–20)
CHLORIDE: 108 mmol/L (ref 101–111)
CO2: 29 mmol/L (ref 22–32)
Calcium: 8.9 mg/dL (ref 8.9–10.3)
Creatinine, Ser: 0.73 mg/dL (ref 0.44–1.00)
GFR calc Af Amer: >60 mL/min (ref >60)
GFR calc non Af Amer: >60 mL/min (ref >60)
GLUCOSE: 109 mg/dL — AB (ref 65–99)
POTASSIUM: 3.9 mmol/L (ref 3.5–5.1)
SODIUM: 144 mmol/L (ref 135–145)
Total Bilirubin: 0.6 mg/dL (ref 0.3–1.2)
Total Protein: 6.4 g/dL — ABNORMAL LOW (ref 6.5–8.1)

## 2015-12-19 LAB — CBC AND DIFFERENTIAL
HCT: 35 % — AB (ref 36–46)
Hemoglobin: 10.9 g/dL — AB (ref 12.0–16.0)
Platelets: 285 10*3/uL (ref 150–399)
WBC: 7.8 10*3/mL

## 2015-12-19 LAB — TYPE AND SCREEN
ABO/RH(D): O POS
Antibody Screen: NEGATIVE

## 2015-12-19 LAB — PROTIME-INR
INR: 1.16
PROTHROMBIN TIME: 14.9 s (ref 11.4–15.2)

## 2015-12-19 LAB — BASIC METABOLIC PANEL
BUN: 29 mg/dL — AB (ref 4–21)
CREATININE: 0.7 mg/dL (ref 0.5–1.1)
Glucose: 109 mg/dL
POTASSIUM: 3.9 mmol/L (ref 3.4–5.3)
Sodium: 144 mmol/L (ref 137–147)

## 2015-12-19 LAB — POCT INR: INR: 1.2 — AB (ref 0.9–1.1)

## 2015-12-19 LAB — APTT: APTT: 24 s (ref 24–36)

## 2015-12-19 LAB — ABO/RH: ABO/RH(D): O POS

## 2015-12-19 MED ORDER — LATANOPROST 0.005 % OP SOLN
1.0000 [drp] | Freq: Every day | OPHTHALMIC | Status: DC
  Administered 2015-12-20 – 2015-12-30 (×10): 1 [drp] via OPHTHALMIC
  Filled 2015-12-19: qty 2.5

## 2015-12-19 MED ORDER — QUETIAPINE FUMARATE ER 50 MG PO TB24
150.0000 mg | ORAL_TABLET | Freq: Every day | ORAL | Status: DC
  Administered 2015-12-20: 150 mg via ORAL
  Filled 2015-12-19 (×4): qty 3

## 2015-12-19 MED ORDER — DORZOLAMIDE HCL-TIMOLOL MAL 2-0.5 % OP SOLN
1.0000 [drp] | Freq: Two times a day (BID) | OPHTHALMIC | Status: DC
  Administered 2015-12-20 – 2015-12-31 (×19): 1 [drp] via OPHTHALMIC
  Filled 2015-12-19 (×2): qty 10

## 2015-12-19 MED ORDER — DIVALPROEX SODIUM ER 500 MG PO TB24
500.0000 mg | ORAL_TABLET | Freq: Every day | ORAL | Status: DC
  Administered 2015-12-22 – 2015-12-23 (×2): 500 mg via ORAL
  Filled 2015-12-19 (×5): qty 1

## 2015-12-19 MED ORDER — POLYETHYLENE GLYCOL 3350 17 G PO PACK
17.0000 g | PACK | Freq: Every day | ORAL | Status: DC
  Administered 2015-12-22 – 2015-12-31 (×8): 17 g via ORAL
  Filled 2015-12-19 (×8): qty 1

## 2015-12-19 MED ORDER — HYDROCODONE-ACETAMINOPHEN 5-325 MG PO TABS
1.0000 | ORAL_TABLET | Freq: Four times a day (QID) | ORAL | Status: DC | PRN
  Administered 2015-12-20: 2 via ORAL
  Administered 2015-12-20 – 2015-12-22 (×2): 1 via ORAL
  Filled 2015-12-19: qty 1
  Filled 2015-12-19 (×2): qty 2
  Filled 2015-12-19 (×2): qty 1

## 2015-12-19 MED ORDER — TRAVOPROST 0.004 % OP SOLN: 1.0000 [drp] | Freq: Every day | OPHTHALMIC | Status: DC

## 2015-12-19 MED ORDER — FLUDROCORTISONE ACETATE 0.1 MG PO TABS
0.1000 mg | ORAL_TABLET | Freq: Every morning | ORAL | Status: DC
Start: 2015-12-20 — End: 2015-12-31
  Administered 2015-12-22 – 2015-12-31 (×9): 0.1 mg via ORAL
  Filled 2015-12-19 (×12): qty 1

## 2015-12-19 MED ORDER — ALBUTEROL SULFATE HFA 108 (90 BASE) MCG/ACT IN AERS: 1.0000 | INHALATION_SPRAY | Freq: Four times a day (QID) | RESPIRATORY_TRACT | Status: DC | PRN

## 2015-12-19 MED ORDER — ACETAMINOPHEN 325 MG PO TABS
650.0000 mg | ORAL_TABLET | Freq: Four times a day (QID) | ORAL | Status: DC | PRN
  Administered 2015-12-24 – 2015-12-29 (×2): 650 mg via ORAL
  Filled 2015-12-19 (×9): qty 2

## 2015-12-19 MED ORDER — ALBUTEROL SULFATE (2.5 MG/3ML) 0.083% IN NEBU
2.5000 mg | INHALATION_SOLUTION | Freq: Four times a day (QID) | RESPIRATORY_TRACT | Status: DC | PRN
  Administered 2015-12-27: 2.5 mg via RESPIRATORY_TRACT
  Filled 2015-12-19: qty 3

## 2015-12-19 NOTE — Progress Notes (Addendum)
Location:  [facility] Room Number: 100P Place of Service:  SNF (31)  Taylor Roth. Taylor Coil, MD  Patient Care Team: Pcp Not In System as PCP - General [name], MD (Gastroenterology) [name], NP (Obstetrics and Gynecology)  Extended Emergency Contact Information Primary Emergency Contact: [name],[name] Address: [place], [place] Phone: [phone] Mobile Phone: [phone] Relation: Daughter Secondary Emergency Contact: [name] Phone: [phone] Mobile Phone: [phone] Relation: Daughter    Allergies: Macrobid [nitrofurantoin macrocrystal]; Azithromycin; Doxycycline; Escitalopram oxalate; Fioricet [butalbital-apap-caffeine]; Latex; Levofloxacin; Nsaids; Oxycodone; Restasis [cyclosporine]; Sulfa antibiotics; and Biaxin [clarithromycin]  Chief Complaint  Patient presents with  . Acute Visit    Acute    HPI: Patient is 82 y.o. female who I am seeing today for a reported hip fracture. After speaking to nursing and PT it appears that pt fell shortly after admission on Thursday 11/2. That Friday pt was up with PT and worked on transfers. No one knows about Saturday and pt worked with PT on Sunday but is reported to c/o R hip pain. Pt was unable to get up to work with PT today 2/2 hip pain and a R hip film was ordered which informal reading while film was being taken was R femoral neck fx of unknown age.   Past Medical History:  Diagnosis Date  . Abuse    benzos/narcotics  . Anxiety   . Arterial tortuosity (aorta) 12/11/2012  . Chronic kidney disease, stage IV (severe) ([place]) 12/11/2012  . Chronic mental illness   . Colitis   . COPD (chronic obstructive pulmonary disease) ([place])   . Depression   . Dysphagia 01/11/2015  . GERD (gastroesophageal reflux disease)   . Insomnia, unspecified   . Migraines   . Nasal bone fracture 03/30/2014    . Osteoporosis   . Schatzki's ring 01/14/2015  . Scoliosis   . Substance abuse    Benzos and Narcotics, she does not get any of those medicines from [facility], we have empahtically told her that.   . Vertigo   . Vitamin B deficiency   . Weight loss     Past Surgical History:  Procedure Laterality Date  . ABDOMINAL HYSTERECTOMY    . CATARACT EXTRACTION    . CHOLECYSTECTOMY    . ESOPHAGOGASTRODUODENOSCOPY (EGD) WITH PROPOFOL N/A 01/13/2015   Procedure: ESOPHAGOGASTRODUODENOSCOPY (EGD) WITH PROPOFOL;  Surgeon: [name], MD;  Location: [facility] ENDOSCOPY;  Service: Endoscopy;  Laterality: N/A;  . PARTIAL HYSTERECTOMY        Medication List       Accurate as of 12/19/15 11:54 AM. Always use your most recent med list.          acetaminophen 325 MG tablet Commonly known as:  TYLENOL Take 650 mg by mouth every 6 (six) hours as needed for headache.   albuterol 108 (90 Base) MCG/ACT inhaler Commonly known as:  PROVENTIL HFA;VENTOLIN HFA Inhale 1 puff into the lungs every 6 (six) hours as needed for shortness of breath.   ASPERCREME 10 % Lotn Generic drug:  Trolamine Salicylate Apply 1 application topically 2 (two) times daily as needed (for painful areas).   aspirin EC 81 MG tablet Take 81 mg by mouth daily.   Biotin 5000 MCG Caps Take 1 capsule by mouth daily with breakfast.   cefPROZIL 500 MG tablet Commonly known as:  CEFZIL Take 1  tablet (500 mg total) by mouth 2 (two) times daily.   clorazepate 3.75 MG tablet Commonly known as:  TRANXENE Take 1 tablet (3.75 mg total) by mouth 3 (three) times daily.   collagenase ointment Commonly known as:  SANTYL Apply topically daily.   Cranberry 425 MG Caps Take 425 mg by mouth 2 (two) times daily.   divalproex 500 MG 24 hr tablet Commonly known as:  DEPAKOTE ER Take 500 mg by mouth daily with breakfast.   docusate sodium 100 MG capsule Commonly known as:  COLACE Take 100 mg by mouth 2 (two) times daily.    dorzolamide-timolol 22.3-6.8 MG/ML ophthalmic solution Commonly known as:  COSOPT Place 1 drop into both eyes 2 (two) times daily.   NUTRITIONAL SUPPLEMENTS PO Give Magic Cup by mouth three times daily with meals   feeding supplement (ENSURE ENLIVE) Liqd Take 237 mLs by mouth 2 (two) times daily between meals.   fludrocortisone 0.1 MG tablet Commonly known as:  FLORINEF Take 0.1 mg by mouth every morning.   HYDROcodone-acetaminophen 7.5-325 MG tablet Commonly known as:  NORCO Take 1 tablet by mouth every 6 (six) hours as needed for severe pain.   hydrocortisone 2.5 % rectal cream Commonly known as:  ANUSOL-HC Place 1 application rectally 2 (two) times daily as needed for hemorrhoids or itching.   lactulose 10 GM/15ML solution Commonly known as:  CHRONULAC Take 15 mLs (10 g total) by mouth daily as needed for severe constipation.   lidocaine 5 % Commonly known as:  LIDODERM Place 1 patch onto the skin every 12 (twelve) hours as needed (for pain). Remove & Discard patch within 12 hours or as directed by MD   metoprolol tartrate 25 MG tablet Commonly known as:  LOPRESSOR Take 25 mg by mouth 2 (two) times daily. 0800 and 1800   mineral oil-hydrophilic petrolatum ointment Apply 1 application topically daily.   mirtazapine 45 MG tablet Commonly known as:  REMERON Take 45 mg by mouth at bedtime.   nitroGLYCERIN 0.4 MG SL tablet Commonly known as:  NITROSTAT Place 0.4 mg under the tongue every 5 (five) minutes as needed for chest pain. May use 3 times   nystatin 100000 UNIT/ML suspension Commonly known as:  MYCOSTATIN Take 5 mLs by mouth 3 (three) times daily as needed (mouth pain).   pantoprazole 40 MG tablet Commonly known as:  PROTONIX Take 1 tablet (40 mg total) by mouth daily at 6 (six) AM.   polyethylene glycol packet Commonly known as:  MIRALAX / GLYCOLAX Take 17 g by mouth daily.   potassium chloride SA 20 MEQ tablet Commonly known as:  K-DUR,KLOR-CON Take  2 tablets (40 mEq total) by mouth daily.   QUEtiapine Fumarate 150 MG 24 hr tablet Commonly known as:  SEROQUEL XR Take 1 tablet (150 mg total) by mouth at bedtime.   ranitidine 150 MG tablet Commonly known as:  ZANTAC Take 150 mg by mouth 2 (two) times daily as needed for heartburn.   senna 8.6 MG tablet Commonly known as:  SENOKOT Take 2 tablets by mouth 2 (two) times daily as needed for constipation.   travoprost (benzalkonium) 0.004 % ophthalmic solution Commonly known as:  TRAVATAN Place 1 drop into both eyes at bedtime.   Vitamin D 1000 units capsule Take 1,000 Units by mouth daily with breakfast.       Meds ordered this encounter  Medications  . NUTRITIONAL SUPPLEMENTS PO    Sig: Give Magic Cup by mouth three times daily  with meals    Immunization History  Administered Date(s) Administered  . Influenza Split 11/13/2011  . Influenza-Unspecified 02/13/2008, 11/30/2013  . PPD Test 12/15/2015  . Pneumococcal-Unspecified 02/13/2008  . Td 02/12/2009    Social History  Substance Use Topics  . Smoking status: Former Smoker    Packs/day: 0.50    Types: Cigarettes  . Smokeless tobacco: Never Used  . Alcohol use No    Review of Systems  UTO 2/2 pt somnalence    Vitals:   12/19/15 1150  BP: 125/78  Pulse: 75  Resp: 16  Temp: 97 F (36.1 C)   Body mass index is 20.2 kg/m. Physical Exam  GENERAL APPEARANCE: sleepy No acute distress  SKIN: No diaphoresis rash HEENT: Unremarkable RESPIRATORY: Breathing is even, unlabored. Lung sounds are clear   CARDIOVASCULAR: Heart RRR no murmurs, rubs or gallops. No peripheral edema ; + DP pulse R foot GASTROINTESTINAL: Abdomen is soft, non-tender, not distended w/ normal bowel sounds.  GENITOURINARY: Bladder non tender, not distended  MUSCULOSKELETAL: pt wakes up and grimances with palpation of groin and and movement of R leg NEUROLOGIC: Cranial nerves 2-12 grossly intact. Moves all extremities PSYCHIATRIC:  somnalent, no behavioral issues  Patient Active Problem List   Diagnosis Date Noted  . Mood disorder ([place]) 12/17/2015  . HCAP (healthcare-associated pneumonia) 12/11/2015  . Decubitus ulcer of hip, stage 3 ([place]) 12/11/2015  . Ankle pain   . Encephalopathy   . Malnutrition of moderate degree 01/29/2015  . Altered mental status 01/26/2015  . Fever 01/26/2015  . Nausea and vomiting 01/17/2015  . Esophageal dysmotilities   . Fever, unspecified   . Metabolic encephalopathy   . Urinary tract infectious disease   . Aspiration pneumonia ([place]) 01/16/2015  . Weakness 01/14/2015  . Schatzki's ring 01/14/2015  . CKD (chronic kidney disease), stage III 01/14/2015  . Esophagus disorder   . Dysphagia 01/11/2015  . UTI (lower urinary tract infection) 03/30/2014  . Hearing loss, sensorineural 03/04/2014  . Nasal lesion 03/04/2014  . Unspecified constipation 12/12/2012  . Hypokalemia 12/11/2012  . Orthostatic hypotension 12/11/2012  . Arterial tortuosity (aorta) 12/11/2012  . Protein-calorie malnutrition, severe ([place]) 12/10/2012  . Decreased rectal sphincter tone 11/21/2012  . Idiopathic scoliosis 07/08/2012  . Macular degeneration 06/26/2011  . Vision disturbance 06/26/2011  . Glaucoma 06/26/2011  . Anemia of chronic disease 04/17/2011  . Drug-seeking behavior 02/26/2011  . Osteoporosis 02/26/2011  . Depression   . Anxiety   . COPD (chronic obstructive pulmonary disease) ([place])   . GERD (gastroesophageal reflux disease)   . Migraines   . Insomnia, unspecified     CMP     Component Value Date/Time   NA 142 12/15/2015 0344   NA 142 12/15/2015   K 3.9 12/15/2015 0344   CL 111 12/15/2015 0344   CO2 23 12/15/2015 0344   GLUCOSE 95 12/15/2015 0344   BUN 15 12/15/2015 0344   BUN 15 12/15/2015   CREATININE 0.74 12/15/2015 0344   CREATININE 0.85 11/09/2013 1135   CALCIUM 8.9 12/15/2015 0344   PROT 5.5 (L) 12/13/2015 0439   ALBUMIN 2.7 (L) 12/13/2015 0439   AST 21 12/13/2015 0439     ALT 10 (L) 12/13/2015 0439   ALKPHOS 61 12/13/2015 0439   BILITOT 0.9 12/13/2015 0439   GFRNONAA >60 12/15/2015 0344   GFRNONAA 64 11/09/2013 1135   GFRAA >60 12/15/2015 0344   GFRAA 74 11/09/2013 1135    Recent Labs  01/16/15 2059  01/30/15 1006 11/23/15 1631  12/12/15 0442 12/13/15 12/13/15 0439 12/15/15 12/15/15 0344  NA 143  < > 142 144  < > 138 143 143 142 142  K 3.0*  < > 3.9 4.6  < > 4.2 3.3* 3.3*  --  3.9  CL 108  < > 105 110  < > 109  --  114*  --  111  CO2 27  < > 27 27  < > 24  --  22  --  23  GLUCOSE 134*  < > 108* 126*  < > 109*  --  104*  --  95  BUN 17  < > 19 28*  < > 20 16 16 15 15   CREATININE 0.66  < > 0.64 0.93  < > 0.89 0.8 0.84 0.7 0.74  CALCIUM 8.5*  < > 9.0 9.0  < > 7.9*  --  8.5*  --  8.9  MG 1.8  < > 1.9 2.1  --   --   --   --   --  1.5*  PHOS 3.3  --   --   --   --   --   --   --   --   --   < > = values in this interval not displayed.  Recent Labs  12/11/15 1549 12/12/15 12/12/15 0442 12/13/15 0439  AST 23 20 20 21   ALT 12* 9 9* 10*  ALKPHOS 79 58  58 58 61  BILITOT 0.5  --  0.6 0.9  PROT 6.7  --  5.3* 5.5*  ALBUMIN 3.4*  --  2.5* 2.7*    Recent Labs  12/12/15 0442 12/13/15 12/13/15 0439 12/15/15 12/15/15 0344  WBC 10.3 9.0 9.0 6.1 6.1  NEUTROABS 7.9* 7 6.9  --   --   HGB 10.4* 10.8* 10.8*  --  10.9*  HCT 32.6* 34* 33.6*  --  34.1*  MCV 93.7  --  93.3  --  91.9  PLT 204 215 215  --  259   No results for input(s): CHOL, LDLCALC, TRIG in the last 8760 hours.  Invalid input(s): HCL No results found for: MICROALBUR Lab Results  Component Value Date   TSH 0.509 06/26/2011   No results found for: HGBA1C No results found for: CHOL, HDL, LDLCALC, LDLDIRECT, TRIG, CHOLHDL  Significant Diagnostic Results in last 30 days:  Dg Cervical Spine Complete  Result Date: 11/19/2015 CLINICAL DATA:  Posterior cervical spine pain after a fall today. History of scoliosis and osteoporosis. EXAM: CERVICAL SPINE - COMPLETE 4+ VIEW COMPARISON:   CT cervical spine 09/09/2014 FINDINGS: There is coalition of C3-C4 and C5 through C7, likely congenital. Anterior subluxation of C4 on C5 and C7 on T1 similar prior study. Cervical spine is convex towards the right. This is also unchanged since prior study. Degenerative changes throughout the cervical facet joints and at the open disc spaces. C1-2 articulation appears intact. No acute vertebral compression deformities. IMPRESSION: No acute displaced fractures are identified. Chronic degenerative changes, vertebral collections, and alignment are unchanged since previous study. Electronically Signed   By: Lucienne Capers M.D.   On: 11/19/2015 22:35   Ct Head Wo Contrast  Result Date: 12/11/2015 CLINICAL DATA:  Altered mental status. EXAM: CT HEAD WITHOUT CONTRAST TECHNIQUE: Contiguous axial images were obtained from the base of the skull through the vertex without intravenous contrast. COMPARISON:  CT scan of November 23, 2015. FINDINGS: Brain: Mild chronic ischemic white matter disease is noted. No mass effect or midline shift is noted.  Ventricular size is within normal limits. There is no evidence of mass lesion, hemorrhage or acute infarction. Vascular: Atherosclerosis of carotid siphons is noted. Skull: Bony calvarium appears intact. Sinuses/Orbits: Paranasal sinuses appear normal. Other: None. IMPRESSION: Mild chronic ischemic white matter disease. No acute intracranial abnormality seen. Electronically Signed   By: Marijo Conception, M.D.   On: 12/11/2015 20:09   Ct Head Wo Contrast  Result Date: 11/23/2015 CLINICAL DATA:  Fall. Initial encounter. EXAM: CT HEAD WITHOUT CONTRAST CT CERVICAL SPINE WITHOUT CONTRAST TECHNIQUE: Multidetector CT imaging of the head and cervical spine was performed following the standard protocol without intravenous contrast. Multiplanar CT image reconstructions of the cervical spine were also generated. COMPARISON:  01/26/2015 FINDINGS: CT HEAD FINDINGS Brain: No evidence of  acute infarction, hemorrhage, hydrocephalus, extra-axial collection or mass lesion/mass effect. Generalized atrophy. Mild for age chronic microvascular ischemic gliosis in the cerebral white matter. Small remote right cerebellar infarct Vascular: Atherosclerotic calcification. Skull: Scalp lacerations or indentations without underlying fracture or opaque foreign body. Sinuses/Orbits: Bilateral cataract resection.  No acute finding. CT CERVICAL SPINE FINDINGS Alignment: Chronic C4-5 anterolisthesis, measuring 3 mm. There is new kyphotic deformity at C5-6 as described below. Skull base and vertebrae: C3-4 and C5-6 anterior cervical discectomy with solid bony fusion. C6-7 non segmentation. Interval fracture through the fused C5-6 vertebrae with kyphotic deformity. Fracture is still seen across the posterior elements at the level of the facets and lamina. The fracture appears chronic, with healed appearance to the vertebral body fracture. No acute fracture is detected. Soft tissues and spinal canal: No prevertebral fluid or swelling. No visible canal hematoma. Disc levels: Diffuse disc and facet degeneration. Bulky transverse ligamentous thickening without foramen magnum stenosis. Upper chest: Pleural based scarring seen at the left apex. IMPRESSION: 1. No evidence of acute intracranial or cervical spine injury. 2. Interval but remote fracture across the C5-6 fused vertebral bodies and posterior elements with healed kyphotic deformity. Incomplete posterior element fracture lines are still visible. 3. Previous C3-4 and C5-6 ACDF. C6-7 non segmentation. Severe adjacent segment facet degeneration at C4-5 with chronic 3 mm anterolisthesis. Electronically Signed   By: Monte Fantasia M.D.   On: 11/23/2015 17:29   Ct Cervical Spine Wo Contrast  Result Date: 11/23/2015 CLINICAL DATA:  Fall. Initial encounter. EXAM: CT HEAD WITHOUT CONTRAST CT CERVICAL SPINE WITHOUT CONTRAST TECHNIQUE: Multidetector CT imaging of the head  and cervical spine was performed following the standard protocol without intravenous contrast. Multiplanar CT image reconstructions of the cervical spine were also generated. COMPARISON:  01/26/2015 FINDINGS: CT HEAD FINDINGS Brain: No evidence of acute infarction, hemorrhage, hydrocephalus, extra-axial collection or mass lesion/mass effect. Generalized atrophy. Mild for age chronic microvascular ischemic gliosis in the cerebral white matter. Small remote right cerebellar infarct Vascular: Atherosclerotic calcification. Skull: Scalp lacerations or indentations without underlying fracture or opaque foreign body. Sinuses/Orbits: Bilateral cataract resection.  No acute finding. CT CERVICAL SPINE FINDINGS Alignment: Chronic C4-5 anterolisthesis, measuring 3 mm. There is new kyphotic deformity at C5-6 as described below. Skull base and vertebrae: C3-4 and C5-6 anterior cervical discectomy with solid bony fusion. C6-7 non segmentation. Interval fracture through the fused C5-6 vertebrae with kyphotic deformity. Fracture is still seen across the posterior elements at the level of the facets and lamina. The fracture appears chronic, with healed appearance to the vertebral body fracture. No acute fracture is detected. Soft tissues and spinal canal: No prevertebral fluid or swelling. No visible canal hematoma. Disc levels: Diffuse disc and facet degeneration. Bulky transverse  ligamentous thickening without foramen magnum stenosis. Upper chest: Pleural based scarring seen at the left apex. IMPRESSION: 1. No evidence of acute intracranial or cervical spine injury. 2. Interval but remote fracture across the C5-6 fused vertebral bodies and posterior elements with healed kyphotic deformity. Incomplete posterior element fracture lines are still visible. 3. Previous C3-4 and C5-6 ACDF. C6-7 non segmentation. Severe adjacent segment facet degeneration at C4-5 with chronic 3 mm anterolisthesis. Electronically Signed   By: Monte Fantasia M.D.   On: 11/23/2015 17:29   Dg Chest Port 1 View  Result Date: 12/15/2015 CLINICAL DATA:  82 year old female with pneumonia. Subsequent encounter. EXAM: PORTABLE CHEST 1 VIEW COMPARISON:  12/11/2015, 08/12/2015 and 01/26/2015. FINDINGS: Slightly asymmetric airspace disease greater on the right which may represent a mild pulmonary edema. No segmental consolidation. Prominent skin fold on left without pneumothorax. Calcified tortuous aorta. Heart size within normal limits. No plain film evidence of pulmonary malignancy. Shoulder joint degenerative changes and scoliosis thoracic spine. IMPRESSION: Findings suggestive of mild pulmonary edema. Electronically Signed   By: Genia Del M.D.   On: 12/15/2015 13:08   Dg Chest Port 1 View  Result Date: 12/11/2015 CLINICAL DATA:  Back pain. EXAM: PORTABLE CHEST 1 VIEW COMPARISON:  August 12, 2015 FINDINGS: The heart size borderline. The hila and mediastinum are unchanged. No pulmonary nodules or masses. Mild interstitial prominence in the right chest, new in the interval. No other acute abnormalities. IMPRESSION: Mild interstitial prominence in the right lung, asymmetric to the left could represent asymmetric edema or an atypical infection. Recommend clinical correlation and follow-up to resolution. Electronically Signed   By: Dorise Bullion III M.D   On: 12/11/2015 16:16    Assessment and Plan  [facility] [place] - there is no question this is an acute fracture based on exam. I have spoken to daughter and she would like the [facility]. I have been able to make pt a direct admit with multiple phone call, etc. Pt will be going to [facility] as requested by daughter. We are waiting for bed assignment.    Time spent > 35 min;> 50% of time with patient was spent reviewing records, labs, tests and studies, counseling and developing plan of care  Taylor Roth. Taylor Coil, MD

## 2015-12-19 NOTE — H&P (Signed)
History and Physical  [name] XTK:[id number] DOB: Jan 05, 1929 DOA: 12/19/2015  Referring physician:  Serita Sheller, physician at skilled nursing facility PCP: Pcp Not In System  Outpatient Specialists: None Patient coming from: Short-term skilled nursing & was just starting to re--ambulate with use of a walker  Chief Complaint: Confusion and reported hip fracture   HPI: Taylor Roth is a 82 y.o. female with medical history significant of bipolar disorder,? Dementia and hypertension who was recently discharged from the hospitalist practice for pneumonia and COPD exacerbation and was discharged to a skilled nursing facility. (Patient previously had been in an assisted living facility for several years).  Following admission to the skilled nursing facility last week, patient reportedly was starting to get up with physical therapy and using a walker. She sustained an unwitnessed fall on the night of 11/3 or morning of 11/4. Since then, she spent more time in bed. Patient's daughter today noted that she was much more lethargic and slightly confused. Her skilled nursing facility ordered right hip x-rays which reportedly showed a hip fracture. Hospitalists were contacted for direct admission. After arrival to floor, patient herself was somewhat lethargic although she could easily awakened. She was more confused which according to the patient's daughter is not her baseline, but her baseline being established back when she was at her assisted living facility several weeks ago. The patient's daughter states that she's not been the same since.   Review of Systems: Patient seen after arrival to floor .   Pt denies any complaints, in part likely from her confusion. She denies any headaches, trouble swallowing, chest pain or trouble breathing, hip pain, abdominal pain .  Review of systems are otherwise negative   Past Medical History:  Diagnosis Date  . Abuse    benzos/narcotics  . Anxiety     . Arterial tortuosity (aorta) 12/11/2012  . Chronic kidney disease, stage IV (severe) ([place]) 12/11/2012  . Chronic mental illness   . Colitis   . COPD (chronic obstructive pulmonary disease) ([place])   . Depression   . Dysphagia 01/11/2015  . GERD (gastroesophageal reflux disease)   . Insomnia, unspecified   . Migraines   . Nasal bone fracture 03/30/2014  . Osteoporosis   . Schatzki's ring 01/14/2015  . Scoliosis   . Substance abuse    Benzos and Narcotics, she does not get any of those medicines from [facility], we have empahtically told her that.   . Vertigo   . Vitamin B deficiency   . Weight loss    Past Surgical History:  Procedure Laterality Date  . ABDOMINAL HYSTERECTOMY    . CATARACT EXTRACTION    . CHOLECYSTECTOMY    . ESOPHAGOGASTRODUODENOSCOPY (EGD) WITH PROPOFOL N/A 01/13/2015   Procedure: ESOPHAGOGASTRODUODENOSCOPY (EGD) WITH PROPOFOL;  Surgeon: [name], MD;  Location: [facility];  Service: Endoscopy;  Laterality: N/A;  . PARTIAL HYSTERECTOMY      Social History:  reports that she has quit smoking. Her smoking use included Cigarettes. She smoked 0.50 packs per day. She has never used smokeless tobacco. She reports that she does not drink alcohol or use drugs.  Prior to her previous hospitalization, she was able to ambulate some on her own or with a walker, however according to her daughter in between meals, should spend a lot of time in bed. Since her hospitalization, she is able to ambulate only with assistance using a walker and PT   Allergies  Allergen Reactions  . Macrobid [Nitrofurantoin Macrocrystal] Rash  .  Azithromycin Other (See Comments)    unknown  . Doxycycline Other (See Comments)    unknown  . Escitalopram Oxalate Other (See Comments)    unknown  . Fioricet [Butalbital-Apap-Caffeine] Other (See Comments)    Drunk.  . Latex Other (See Comments)    unknown  . Levofloxacin Other (See Comments)    unknown  . Nsaids Other (See Comments)    This is  not an allergy. The patient was told by Dr. Rockne Menghini to avoid NSAIDs and stop Vicoprofen in to 2014 to avoid nephrotoxicity.   Allergic reaction not listed on MAR.  Marland Kitchen Oxycodone Other (See Comments)    reaction to synthetic codeine  . Restasis [Cyclosporine] Other (See Comments)    unknown  . Sulfa Antibiotics Other (See Comments)    Reaction unknown  . Biaxin [Clarithromycin] Rash    With burning sensation    Family History  Problem Relation Age of Onset  . Stroke Mother   . Diabetes Mother   . Heart disease Mother   . Hearing loss Mother   . Cancer Father 2    esophageal     Prior to Admission medications   Medication Sig Start Date End Date Taking? Authorizing Provider  acetaminophen (TYLENOL) 325 MG tablet Take 650 mg by mouth every 6 (six) hours as needed for headache.   Yes Historical Provider, MD  albuterol (PROVENTIL HFA;VENTOLIN HFA) 108 (90 BASE) MCG/ACT inhaler Inhale 1 puff into the lungs every 6 (six) hours as needed for shortness of breath.    Yes Historical Provider, MD  aspirin EC 81 MG tablet Take 81 mg by mouth daily with breakfast.    Yes Historical Provider, MD  Biotin 5000 MCG CAPS Take 1 capsule by mouth daily with breakfast.   Yes Historical Provider, MD  cefPROZIL (CEFZIL) 500 MG tablet Take 1 tablet (500 mg total) by mouth 2 (two) times daily. 12/15/15 12/25/15 Yes Doreatha Lew, MD  Cholecalciferol (VITAMIN D) 1000 UNITS capsule Take 1,000 Units by mouth daily with breakfast.    Yes Historical Provider, MD  clorazepate (TRANXENE) 3.75 MG tablet Take 1 tablet (3.75 mg total) by mouth 3 (three) times daily. 12/15/15  Yes Doreatha Lew, MD  Cranberry 425 MG CAPS Take 425 mg by mouth 2 (two) times daily.    Yes Historical Provider, MD  divalproex (DEPAKOTE ER) 500 MG 24 hr tablet Take 500 mg by mouth daily with breakfast.   Yes Historical Provider, MD  docusate sodium (COLACE) 100 MG capsule Take 100 mg by mouth 2 (two) times daily.   Yes Historical  Provider, MD  dorzolamide-timolol (COSOPT) 22.3-6.8 MG/ML ophthalmic solution Place 1 drop into both eyes 2 (two) times daily.   Yes Historical Provider, MD  feeding supplement, ENSURE ENLIVE, (ENSURE ENLIVE) LIQD Take 237 mLs by mouth 2 (two) times daily between meals. 12/15/15  Yes Doreatha Lew, MD  fludrocortisone (FLORINEF) 0.1 MG tablet Take 0.1 mg by mouth every morning.   Yes Historical Provider, MD  HYDROcodone-acetaminophen (NORCO) 7.5-325 MG tablet Take 1 tablet by mouth every 6 (six) hours as needed for severe pain.   Yes Historical Provider, MD  hydrocortisone (ANUSOL-HC) 2.5 % rectal cream Place 1 application rectally 2 (two) times daily as needed for hemorrhoids or itching.   Yes Historical Provider, MD  lactulose (CHRONULAC) 10 GM/15ML solution Take 15 mLs (10 g total) by mouth daily as needed for severe constipation. 08/12/15  Yes [name], MD  lidocaine (LIDODERM) 5 % Place  1 patch onto the skin every 12 (twelve) hours as needed (for pain). Remove & Discard patch within 12 hours or as directed by MD   Yes Historical Provider, MD  metoprolol tartrate (LOPRESSOR) 25 MG tablet Take 25 mg by mouth 2 (two) times daily.    Yes Historical Provider, MD  mirtazapine (REMERON) 45 MG tablet Take 45 mg by mouth at bedtime.   Yes Historical Provider, MD  nitroGLYCERIN (NITROSTAT) 0.4 MG SL tablet Place 0.4 mg under the tongue every 5 (five) minutes as needed for chest pain. May use 3 times   Yes Historical Provider, MD  nystatin (MYCOSTATIN) 100000 UNIT/ML suspension Take 5 mLs by mouth 3 (three) times daily as needed (mouth pain).    Yes Historical Provider, MD  pantoprazole (PROTONIX) 40 MG tablet Take 1 tablet (40 mg total) by mouth daily at 6 (six) AM. 01/14/15  Yes [name], MD  polyethylene glycol (MIRALAX / GLYCOLAX) packet Take 17 g by mouth daily.    Yes Historical Provider, MD  potassium chloride SA (K-DUR,KLOR-CON) 20 MEQ tablet Take 2 tablets (40 mEq total) by mouth  daily. 01/31/15  Yes Domenic Polite, MD  QUEtiapine Fumarate (SEROQUEL XR) 150 MG 24 hr tablet Take 1 tablet (150 mg total) by mouth at bedtime. 12/15/15  Yes Doreatha Lew, MD  ranitidine (ZANTAC) 150 MG tablet Take 150 mg by mouth 2 (two) times daily as needed for heartburn.   Yes Historical Provider, MD  senna (SENOKOT) 8.6 MG tablet Take 2 tablets by mouth 2 (two) times daily as needed for constipation.   Yes Historical Provider, MD  travoprost, benzalkonium, (TRAVATAN) 0.004 % ophthalmic solution Place 1 drop into both eyes at bedtime.   Yes Historical Provider, MD  Trolamine Salicylate (ASPERCREME) 10 % LOTN Apply 1 application topically 2 (two) times daily as needed (for painful areas).   Yes Historical Provider, MD  collagenase (SANTYL) ointment Apply topically daily. Patient not taking: Reported on 12/19/2015 12/15/15   Doreatha Lew, MD    Physical Exam: BP (!) 163/81 (BP Location: Right Arm)   Pulse 67   Temp 98.9 F (37.2 C) (Axillary)   Resp 18   Ht 4\' 11"  (1.499 m)   Wt 45.2 kg (99 lb 10.4 oz)   SpO2 98%   BMI 20.13 kg/m   General:  Oriented 1, no acute distress  Eyes: Sclera appear nonicteric, extraocular movements are intact  ENT: Normocephalic, atraumatic, mucous members are dry, false teeth which are not currently in  Neck: No JVD  Cardiovascular: Regular rate and rhythm, S1-S2  Respiratory: Decreased breath sounds throughout  Abdomen: Soft, nontender, nondistended, positive bowel sounds  Skin: Healing left decubitus ulcer on her hip  Musculoskeletal: No clubbing or cyanosis or edema  Psychiatric: Patient with some confusion, no evidence of psychoses  Neurologic: No focal deficit           Labs on Admission:  Basic Metabolic Panel:  Recent Labs Lab 12/13/15 12/13/15 0439 12/15/15 12/15/15 0344  NA 143 143 142 142  K 3.3* 3.3*  --  3.9  CL  --  114*  --  111  CO2  --  22  --  23  GLUCOSE  --  104*  --  95  BUN 16 16 15 15   CREATININE 0.8 0.84  0.7 0.74  CALCIUM  --  8.5*  --  8.9  MG  --   --   --  1.5*   Liver Function Tests:  Recent Labs Lab 12/13/15 0439  AST 21  ALT 10*  ALKPHOS 61  BILITOT 0.9  PROT 5.5*  ALBUMIN 2.7*   No results for input(s): LIPASE, AMYLASE in the last 168 hours. No results for input(s): AMMONIA in the last 168 hours. CBC:  Recent Labs Lab 12/13/15 12/13/15 0439 12/15/15 12/15/15 0344  WBC 9.0 9.0 6.1 6.1  NEUTROABS 7 6.9  --   --   HGB 10.8* 10.8*  --  10.9*  HCT 34* 33.6*  --  34.1*  MCV  --  93.3  --  91.9  PLT 215 215  --  259   Cardiac Enzymes: No results for input(s): CKTOTAL, CKMB, CKMBINDEX, TROPONINI in the last 168 hours.  BNP (last 3 results) No results for input(s): BNP in the last 8760 hours.  ProBNP (last 3 results) No results for input(s): PROBNP in the last 8760 hours.  CBG: No results for input(s): GLUCAP in the last 168 hours.  Radiological Exams on Admission: No results found.  EKG: Independently reviewed. Normal sinus rhythm   Assessment/Plan Present on Admission: . Protein-calorie malnutrition, severe ([place]): Nutrition to see . Osteoporosis: Continue calcium and vitamin D supplementation. A strong controlling factor to patient's hip fracture . Glaucoma: Continue eyedrops . COPD (chronic obstructive pulmonary disease) ([place]): Stable at this point.  . Decubitus ulcer of hip, stage 3 (H[facility]) left sided: Continue same dressing care and coverage as before. Looks to be nicely healing.  Suspected underlying dementia  with acute behavioral disturbance encephalopathy/: Patient's daughter states that she is normally not confused, but it sounds like she does get confused when she is in the hospital. I suspect that when she is at her assisted living facility surrounded by her normal environment she is fine and so she gets ill and then is out of her surroundings. Explained this to her daughter. Given these concerns, will screen for dysphagia.  I suspect some of her other  confusion and somnolence may be from pain medication given   [place] her fracture sed right hip fracture, initial encounter: Have spoken to Dr. Ronnie Derby, who is covering for Dr. Weston Anna, who reportedly are the orthopedists who have seen the patient in the past. I'm not able to see the x-rays that were done at the skilled nursing facility, so ordered stat repeat of those as well as started all initial blood work and screening for surgery. Principal Problem:   Closed right hip fracture, initial encounter ([place]) Active Problems:   COPD (chronic obstructive pulmonary disease) (H[facility])   Osteoporosis   Glaucoma   Protein-calorie malnutrition, severe (H[facility])   CKD (chronic kidney disease), stage III   Encephalopathy   Decubitus ulcer of hip, stage 3 ([facility])   DVT prophylaxis: SCDs   Code Status: DO NOT RESUSCITATE as confirmed by daughter   Family Communication: Daughter at the bedside   Disposition Plan:  Following x-rays and/or the evaluation, patient likely will need hip fracture repair. She then will need several days of recovery before being able to return back to her short-term skilled nursing facility  Consults called: Dr. Ronnie Derby, orthopedic surgery   Admission status: Patient will require surgery, followed by a few days of postop recovery, she will certainly be here beyond to midnight and therefore, is being placed in inpatient status    Annita Brod MD [name] Pager [phone]  If 7PM-7AM, please contact night-coverage [email or web address] Password [email or web address]  12/19/2015, 5:40 PM

## 2015-12-19 NOTE — Progress Notes (Signed)
Pt arrived from [facility] by EMS. Pt responsive to voice only. Pt sleeping in NAD. Pt transferred over to bed and grimaced upon movement to hospital bed. Pt noted to have external rotation to RLE. Daughter arrived at bedside and states this is not normal per patient. Dr. Maryland Pink paged and notified that patient has arrived to unit.

## 2015-12-20 ENCOUNTER — Encounter (HOSPITAL_COMMUNITY): Payer: Self-pay | Admitting: *Deleted

## 2015-12-20 ENCOUNTER — Encounter (HOSPITAL_BASED_OUTPATIENT_CLINIC_OR_DEPARTMENT_OTHER): Payer: Medicare Other

## 2015-12-20 LAB — CBC
HEMATOCRIT: 34.1 % — AB (ref 36.0–46.0)
HEMOGLOBIN: 10.5 g/dL — AB (ref 12.0–15.0)
MCH: 28.9 pg (ref 26.0–34.0)
MCHC: 30.8 g/dL (ref 30.0–36.0)
MCV: 93.9 fL (ref 78.0–100.0)
Platelets: 272 10*3/uL (ref 150–400)
RBC: 3.63 MIL/uL — ABNORMAL LOW (ref 3.87–5.11)
RDW: 13.7 % (ref 11.5–15.5)
WBC: 7.1 10*3/uL (ref 4.0–10.5)

## 2015-12-20 LAB — BASIC METABOLIC PANEL
ANION GAP: 9 (ref 5–15)
BUN: 28 mg/dL — AB (ref 4–21)
BUN: 28 mg/dL — AB (ref 6–20)
CO2: 28 mmol/L (ref 22–32)
CREATININE: 0.6 mg/dL (ref 0.5–1.1)
Calcium: 9 mg/dL (ref 8.9–10.3)
Chloride: 109 mmol/L (ref 101–111)
Creatinine, Ser: 0.63 mg/dL (ref 0.44–1.00)
GFR calc Af Amer: >60 mL/min (ref >60)
GLUCOSE: 103 mg/dL — AB (ref 65–99)
Glucose: 103 mg/dL
POTASSIUM: 3.8 mmol/L (ref 3.5–5.1)
Potassium: 3.8 mmol/L (ref 3.4–5.3)
SODIUM: 146 mmol/L (ref 137–147)
Sodium: 146 mmol/L — ABNORMAL HIGH (ref 135–145)

## 2015-12-20 LAB — CBC AND DIFFERENTIAL
HCT: 34 % — AB (ref 36–46)
Hemoglobin: 10.5 g/dL — AB (ref 12.0–16.0)
Platelets: 272 10*3/uL (ref 150–399)
WBC: 7.1 10^3/mL

## 2015-12-20 MED ORDER — SENNA 8.6 MG PO TABS: 2.0000 | ORAL_TABLET | Freq: Two times a day (BID) | ORAL | Status: DC | PRN

## 2015-12-20 MED ORDER — POVIDONE-IODINE 10 % EX SWAB: 2.0000 "application " | Freq: Once | CUTANEOUS | Status: DC

## 2015-12-20 MED ORDER — FAMOTIDINE 20 MG PO TABS
10.0000 mg | ORAL_TABLET | Freq: Two times a day (BID) | ORAL | Status: DC
  Administered 2015-12-20 – 2015-12-30 (×16): 10 mg via ORAL
  Filled 2015-12-20 (×21): qty 1

## 2015-12-20 MED ORDER — PANTOPRAZOLE SODIUM 40 MG PO TBEC
40.0000 mg | DELAYED_RELEASE_TABLET | Freq: Every day | ORAL | Status: DC
Start: 2015-12-20 — End: 2015-12-31
  Administered 2015-12-20 – 2015-12-31 (×13): 40 mg via ORAL
  Filled 2015-12-20 (×13): qty 1

## 2015-12-20 MED ORDER — CHLORHEXIDINE GLUCONATE 4 % EX LIQD: 60.0000 mL | Freq: Once | CUTANEOUS | Status: DC

## 2015-12-20 MED ORDER — CRANBERRY 425 MG PO CAPS: 425.0000 mg | ORAL_CAPSULE | Freq: Two times a day (BID) | ORAL | Status: DC

## 2015-12-20 MED ORDER — METOPROLOL TARTRATE 25 MG PO TABS
25.0000 mg | ORAL_TABLET | Freq: Two times a day (BID) | ORAL | Status: DC
  Administered 2015-12-20 – 2015-12-31 (×22): 25 mg via ORAL
  Filled 2015-12-20 (×22): qty 1

## 2015-12-20 MED ORDER — ENSURE ENLIVE PO LIQD: 237.0000 mL | Freq: Two times a day (BID) | ORAL | Status: DC

## 2015-12-20 MED ORDER — HALOPERIDOL LACTATE 5 MG/ML IJ SOLN
0.5000 mg | Freq: Once | INTRAMUSCULAR | Status: AC
  Administered 2015-12-21: 5 mg via INTRAVENOUS
  Filled 2015-12-20: qty 1

## 2015-12-20 MED ORDER — COLLAGENASE 250 UNIT/GM EX OINT
TOPICAL_OINTMENT | Freq: Every day | CUTANEOUS | Status: DC
  Administered 2015-12-22 – 2015-12-23 (×2): via TOPICAL
  Administered 2015-12-24 – 2015-12-25 (×2): 1 via TOPICAL
  Administered 2015-12-26: 10:00:00 via TOPICAL
  Administered 2015-12-27: 1 via TOPICAL
  Administered 2015-12-27 – 2015-12-31 (×5): via TOPICAL
  Filled 2015-12-20 (×2): qty 30

## 2015-12-20 MED ORDER — CEFAZOLIN SODIUM-DEXTROSE 2-4 GM/100ML-% IV SOLN
2.0000 g | INTRAVENOUS | Status: AC
  Administered 2015-12-21: 2 g via INTRAVENOUS
  Filled 2015-12-20 (×2): qty 100

## 2015-12-20 MED ORDER — ENSURE ENLIVE PO LIQD
237.0000 mL | Freq: Two times a day (BID) | ORAL | Status: DC
  Administered 2015-12-22 – 2015-12-27 (×9): 237 mL via ORAL

## 2015-12-20 MED ORDER — POTASSIUM CHLORIDE CRYS ER 20 MEQ PO TBCR
40.0000 meq | EXTENDED_RELEASE_TABLET | Freq: Every day | ORAL | Status: DC
  Administered 2015-12-20 – 2015-12-22 (×2): 40 meq via ORAL
  Filled 2015-12-20 (×2): qty 2

## 2015-12-20 NOTE — Progress Notes (Signed)
Initial Nutrition Assessment  DOCUMENTATION CODES:   Severe malnutrition in context of acute illness/injury  INTERVENTION:  Provide Ensure Enlive po BID with meals, each supplement provides 350 kcal and 20 grams of protein.  Patient will require assistance with meals when family not present.  Will continue to monitor for nutrition-related needs. On last admission patient had whole milk with all trays and Magic Cup TID with meals.   NUTRITION DIAGNOSIS:   Inadequate oral intake related to acute illness, poor appetite, lethargy/confusion as evidenced by per patient/family report, 7 percent weight loss over 3 weeks.  GOAL:   Patient will meet greater than or equal to 90% of their needs  MONITOR:   PO intake, Supplement acceptance, Labs, Weight trends, Skin, I & O's  REASON FOR ASSESSMENT:   Consult, Malnutrition Screening Tool, Low [place] Assessment of nutrition requirement/status  ASSESSMENT:   82 year old female with bipolar disorder, question mild dementia, hypertension, benzos and narcotics use, CKD stage IV, COPD, GERD, depression who was recently hospitalized for pneumonia and COPD exacerbation and discharged to skilled nursing facility. Patient was participating with physical therapy at the facility but reportedly had an unwitnessed fall 2 days prior to admission. Since then she was not participating with PT and remained in bed. When daughter went to visit her on the day of admission she was found to be very confused and lethargic.   -Patient from [facility] to transfer to [facility] for right hip hemiarthroplasty.  Attempted to speak with patient at bedside this morning. She was lethargic and unable to answer questions. Upon returning to room later, found patient's daughter at bedside. Daughter reports patient's appetite remains poor. She is only able to finish 50-66% of 2 meals daily. Denies N/V or abdominal pain. Endorses ongoing constipation.  Reports UBW was 102 lbs on 10/10, since then patient lost weight to 95 lbs on 10/30. Per daughter, the weights in chart since then are reported and not measured, so they are inaccurate. Plan is to try to obtain weight prior to patient's surgery. Patient lost 7 lbs (7% body weight) over 3 weeks, which is significant for time frame.  Daughter reports that patient likes Vanilla Ensure over ice, but that it cannot be taken between meals. If she takes between meals she will not eat the next meal, so recommended providing with meals.  Medications reviewed and include: famotidine, pantoprazole, Miralax, potassium chloride 40 mEq daily.  Labs reviewed: Sodium 146, BUN 28, Glucose 103.   Nutrition-Focused physical exam completed. Findings are severe fat depletion, severe muscle depletion. Did not assess lower body out of respect for patient comfort - she is confused and did not want to exacerbate hip pain.   Patient meets criteria for severe acute malnutrition in setting of 7% weight loss over 3 weeks, intake </= 50% for >/= 5 days. Likely also a chronic component in setting of severe loss of subcutaneous fat and muscle.  Discussed with RN.   Diet Order:  DIET DYS 3 Room service appropriate? Yes; Fluid consistency: Thin  Skin:  Wound (see comment) (Unstageable to L hip)  Last BM:  Unknown  Height:   Ht Readings from Last 1 Encounters:  12/19/15 4\' 11"  (1.499 m)    Weight:   Wt Readings from Last 1 Encounters:  12/19/15 99 lb 10.4 oz (45.2 kg)    Ideal Body Weight:  45.45 kg  BMI:  Body mass index is 20.13 kg/m.  Estimated Nutritional Needs:   Kcal:  1350-1580 (30-35 kcal/kg)  Protein:  68-81 grams (1.5-1.8 grams/kg)  Fluid:  >/= 1.5 L/day  EDUCATION NEEDS:   No education needs identified at this time  Willey Blade, MS, RD, LDN Pager: [phone] After Hours Pager: [phone]

## 2015-12-20 NOTE — Progress Notes (Addendum)
[facility] came to pick pt up. Scheduled 22:00 meds given. Report called and given to receiving nurse . Pt pended to [facility]  5N ortho room 30-C-01. Daughter [name] signed consent form,for surgery schd. Tomorrow with Dr. Stann Mainland at 02:15p

## 2015-12-20 NOTE — Progress Notes (Signed)
Noted Pt with L hip decub. Pt from [facility] facility. Wound care consulted. Wound placed with alevyn dressing and made day shift aware.   Awaiting ortho consult for possible surgery.

## 2015-12-20 NOTE — Evaluation (Signed)
Clinical/Bedside Swallow Evaluation Patient Details  Name: Taylor Roth MRN: [id number] Date of Birth: 10/14/1928  Today's Date: 12/20/2015 Time: SLP Start Time (ACUTE ONLY): 1250 SLP Stop Time (ACUTE ONLY): 1317 SLP Time Calculation (min) (ACUTE ONLY): 27 min  Past Medical History:  Past Medical History:  Diagnosis Date  . Abuse    benzos/narcotics  . Anxiety   . Arterial tortuosity (aorta) 12/11/2012  . Chronic kidney disease, stage IV (severe) ([place]) 12/11/2012  . Chronic mental illness   . Colitis   . COPD (chronic obstructive pulmonary disease) ([place])   . Depression   . Dysphagia 01/11/2015  . GERD (gastroesophageal reflux disease)   . Insomnia, unspecified   . Migraines   . Nasal bone fracture 03/30/2014  . Osteoporosis   . Schatzki's ring 01/14/2015  . Scoliosis   . Substance abuse    Benzos and Narcotics, she does not get any of those medicines from [facility], we have empahtically told her that.   . Vertigo   . Vitamin B deficiency   . Weight loss    Past Surgical History:  Past Surgical History:  Procedure Laterality Date  . ABDOMINAL HYSTERECTOMY    . CATARACT EXTRACTION    . CHOLECYSTECTOMY    . ESOPHAGOGASTRODUODENOSCOPY (EGD) WITH PROPOFOL N/A 01/13/2015   Procedure: ESOPHAGOGASTRODUODENOSCOPY (EGD) WITH PROPOFOL;  Surgeon: [name], MD;  Location: [facility] END[facility];  Service: Endoscopy;  Laterality: N/A;  . PARTIAL HYSTERECTOMY     HPI:  Taylor Roth is a 82 y.o. female with medical history significant of benzo/narcotics abuse, anxiety, depression, CKD, COPD, GERD, migraines, osteoporosis, scoliosis, substance abuse, vertigo admitted after fall.  Found to have a hip fx and plans are for surgery.  Pt has been admitted recently with pna per her daughter and has baseline esophageal dysphagia with prior food impaction per chart review.  Esophagram 01/18/2015 done showed poor primary esophageal stripping wave and tertiary contractions and a prominent kink of mid and  distal thoracic esophagus resulting in lodged barium tablet that did not clear despite warm water ingestion over 10 minutes.  Per family within the last weeks, pt has needed someone to feed her at her facility.  Pt is npo currently and plan is for surgery the next date.    Assessment / Plan / Recommendation Clinical Impression  Pt sleepy with open mouth breathing posture that contributes to xerostomia.  She has generalized weakness but no overt focal CN deficit.  Family present and helped to place pt's dentures.  Pt observed to self feed applesauce, moistened graham cracker and water via cup/straw.   Slow mastications with solids with mild oral residuals noted that cleared with applesauce consumption.  Subtle cough s/p sequential swallows of thin via cup and straw- prevented with small single boluses.  Suspect pt's oral deficits are due to generalized weakness from recent medical issues.  Pt has a history of esophageal dysfunction with dysmotility and "kink" in esophagus and this increases her risk of aspiration.  Recommend remain upright after meals for at least 30 minutes, small frequent meals, small bites/sips and alternate solids with liquids. Education provided to patient and family - and swallow precaution signs sent to the floor for this pt's room.  SlP will follow up x1 to assure tolerance due to pt's pending surgery, current mild oral and known esophageal deficits.  Educated family to dysphagia mitigation strategies and increased pna risk given compromised mobility from hip fx.     Aspiration Risk  Mild aspiration risk  Diet Recommendation Regular;Thin liquid (Soft)   Medication Administration: Whole meds with puree Supervision: Staff to assist with self feeding Compensations: Minimize environmental distractions;Slow rate;Small sips/bites;Follow solids with liquid    Other  Recommendations Oral Care Recommendations: Oral care BID   Follow up Recommendations  (TBD)      Frequency and  Duration min 1 x/week  1 week       Prognosis Prognosis for Safe Diet Advancement: Fair Barriers to Reach Goals: Medication      Swallow Study   General Date of Onset: 12/20/15 HPI: Taylor Roth is a 82 y.o. female with medical history significant of benzo/narcotics abuse, anxiety, depression, CKD, COPD, GERD, migraines, osteoporosis, scoliosis, substance abuse, vertigo admitted after fall.  Found to have a hip fx and plans are for surgery.  Pt has been admitted recently with pna per her daughter and has baseline esophageal dysphagia with prior food impaction per chart review.  Esophagram 01/18/2015 done showed poor primary esophageal stripping wave and tertiary contractions and a prominent kink of mid and distal thoracic esophagus resulting in lodged barium tablet that did not clear despite warm water ingestion over 10 minutes.  Per family within the last weeks, pt has needed someone to feed her at her facility.  Pt is npo currently and plan is for surgery the next date.  Type of Study: Bedside Swallow Evaluation Diet Prior to this Study: NPO Temperature Spikes Noted: Yes Respiratory Status: Nasal cannula History of Recent Intubation: No Behavior/Cognition: Alert;Cooperative Oral Cavity Assessment: Dry Oral Care Completed by SLP: No Oral Cavity - Dentition: Dentures, top;Dentures, bottom Self-Feeding Abilities: Needs assist Patient Positioning: Partially reclined (partially reclined due to hip fracture) Baseline Vocal Quality: Normal Volitional Cough: Other (Comment) (reflexive cough was weak, pt did not cough volitionally due to concern for pain from fx) Volitional Swallow: Unable to elicit    Oral/Motor/Sensory Function Overall Oral Motor/Sensory Function: Generalized oral weakness (? minimal facial asymmetry on left)   Ice Chips Ice chips: Not tested   Thin Liquid Thin Liquid: Impaired Presentation: Cup;Self Fed;Straw Pharyngeal  Phase Impairments: Cough - Immediate Other  Comments: subtle cough s/p sequential swallows, small single boluses tolerated well without indication of aspiration    Nectar Thick Nectar Thick Liquid: Not tested   Honey Thick Honey Thick Liquid: Not tested   Puree Puree: Within functional limits Presentation: Spoon   Solid   GO   Solid: Impaired Presentation: Spoon Oral Phase Impairments: Reduced lingual movement/coordination;Impaired mastication Oral Phase Functional Implications: Impaired mastication;Prolonged oral transit Other Comments: Moisten soft solids        Luanna Salk, [facility] SLP [phone]

## 2015-12-20 NOTE — Progress Notes (Signed)
CSW assisting with d/c planning. Pt hospitalized on 12/19/15 from [place] with a closed right hip fx. Pt had been recently hospitalized at [facility] ( 10/29 ) and d/c to SNF for rehab ( 11/2 ). Please see psychosocial assessment dated 12/13/15. Pt was sleeping soundly when CSW attempt to see her this am. CSW spoke with pt's daughter, [name], [phone], to assist with d/c planning. Daughter would like pt to return to [facility] when stable for d/c. SNF contacted and will readmit once stable.    CSW informed by nsg that pt will transfer to [facility] for hip surgery. [place] has been updated.  CSW at [facility] will follow to assist with d/c planning back to SNF at d/c.  Werner Lean LCSW [phone]

## 2015-12-20 NOTE — Consult Note (Signed)
[place] Nurse wound consult note Reason for Consult: Stage 3 pressure injury to left hip. Seen by me on 12/12/15 at which time the ulcer was Unstageable.  Eschar has debrided with Santyl use and 50% of wound bed is now visible. Wound type:Pressure Pressure Ulcer POA: Yes Measurement:3cm x 1.8cm x 0.5cm Wound bed:50% yellow soft slough, 50% pink granulating tissue Drainage (amount, consistency, odor) small amount serous to light yellow exudate consistent with autolytically debriding tissue Periwound:intact Dressing procedure/placement/frequency: I have placed orders and provided Nursing with guidance via the Orders for use of Santyl.  Patient is to be transferred today to [facility] for OR tomorrow. [facility] nursing team will not follow, but will remain available to this patient, the nursing and medical teams.  Please re-consult if needed. Thanks, Maudie Flakes, MSN, RN, [place], Arther Abbott  Pager# [phone]

## 2015-12-20 NOTE — Consult Note (Signed)
NAME: Taylor Roth MRN:   [id number] DOB:   02-Apr-1928   CHIEF COMPLAINT:  Right hip pain  HISTORY:   Taylor Roth a 82 y.o. female  with right  Hip Pain Patient complains of right hip pain. Onset of the symptoms was yesterday. The patient reports the hip pain is worse with weight bearing. Associated symptoms: none, injury. Aggravating symptoms include: any weight bearing. Patient has had no prior hip problems. Previous visits for this problem: none. Evaluation to date: plain films, which were abnormal  femoral neck fracture. Treatment to date: none.   PAST MEDICAL HISTORY:  Past Medical History:  Diagnosis Date  . Abuse    benzos/narcotics  . Anxiety   . Arterial tortuosity (aorta) 12/11/2012  . Chronic kidney disease, stage IV (severe) ([place]) 12/11/2012  . Chronic mental illness   . Colitis   . COPD (chronic obstructive pulmonary disease) ([place])   . Depression   . Dysphagia 01/11/2015  . GERD (gastroesophageal reflux disease)   . Insomnia, unspecified   . Migraines   . Nasal bone fracture 03/30/2014  . Osteoporosis   . Schatzki's ring 01/14/2015  . Scoliosis   . Substance abuse    Benzos and Narcotics, she does not get any of those medicines from [facility], we have empahtically told her that.   . Vertigo   . Vitamin B deficiency   . Weight loss     PAST SURGICAL HISTORY:   Past Surgical History:  Procedure Laterality Date  . ABDOMINAL HYSTERECTOMY    . CATARACT EXTRACTION    . CHOLECYSTECTOMY    . ESOPHAGOGASTRODUODENOSCOPY (EGD) WITH PROPOFOL N/A 01/13/2015   Procedure: ESOPHAGOGASTRODUODENOSCOPY (EGD) WITH PROPOFOL;  Surgeon: [name], MD;  Location: [facility];  Service: Endoscopy;  Laterality: N/A;  . PARTIAL HYSTERECTOMY      MEDICATIONS:   Medications Prior to Admission  Medication Sig Dispense Refill  . acetaminophen (TYLENOL) 325 MG tablet Take 650 mg by mouth every 6 (six) hours as needed for headache.    . albuterol (PROVENTIL HFA;VENTOLIN HFA)  108 (90 BASE) MCG/ACT inhaler Inhale 1 puff into the lungs every 6 (six) hours as needed for shortness of breath.     Marland Kitchen aspirin EC 81 MG tablet Take 81 mg by mouth daily with breakfast.     . Biotin 5000 MCG CAPS Take 1 capsule by mouth daily with breakfast.    . cefPROZIL (CEFZIL) 500 MG tablet Take 1 tablet (500 mg total) by mouth 2 (two) times daily. 20 tablet 0  . Cholecalciferol (VITAMIN D) 1000 UNITS capsule Take 1,000 Units by mouth daily with breakfast.     . clorazepate (TRANXENE) 3.75 MG tablet Take 1 tablet (3.75 mg total) by mouth 3 (three) times daily. 90 tablet 0  . Cranberry 425 MG CAPS Take 425 mg by mouth 2 (two) times daily.     . divalproex (DEPAKOTE ER) 500 MG 24 hr tablet Take 500 mg by mouth daily with breakfast.    . docusate sodium (COLACE) 100 MG capsule Take 100 mg by mouth 2 (two) times daily.    . dorzolamide-timolol (COSOPT) 22.3-6.8 MG/ML ophthalmic solution Place 1 drop into both eyes 2 (two) times daily.    . feeding supplement, ENSURE ENLIVE, (ENSURE ENLIVE) LIQD Take 237 mLs by mouth 2 (two) times daily between meals. 237 mL 12  . fludrocortisone (FLORINEF) 0.1 MG tablet Take 0.1 mg by mouth every morning.    Marland Kitchen HYDROcodone-acetaminophen (NORCO) 7.5-325 MG tablet Take  1 tablet by mouth every 6 (six) hours as needed for severe pain.    . hydrocortisone (ANUSOL-HC) 2.5 % rectal cream Place 1 application rectally 2 (two) times daily as needed for hemorrhoids or itching.    . lactulose (CHRONULAC) 10 GM/15ML solution Take 15 mLs (10 g total) by mouth daily as needed for severe constipation. 240 mL 0  . lidocaine (LIDODERM) 5 % Place 1 patch onto the skin every 12 (twelve) hours as needed (for pain). Remove & Discard patch within 12 hours or as directed by MD    . metoprolol tartrate (LOPRESSOR) 25 MG tablet Take 25 mg by mouth 2 (two) times daily.     . mirtazapine (REMERON) 45 MG tablet Take 45 mg by mouth at bedtime.    . nitroGLYCERIN (NITROSTAT) 0.4 MG SL tablet  Place 0.4 mg under the tongue every 5 (five) minutes as needed for chest pain. May use 3 times    . nystatin (MYCOSTATIN) 100000 UNIT/ML suspension Take 5 mLs by mouth 3 (three) times daily as needed (mouth pain).     . pantoprazole (PROTONIX) 40 MG tablet Take 1 tablet (40 mg total) by mouth daily at 6 (six) AM. 30 tablet 2  . polyethylene glycol (MIRALAX / GLYCOLAX) packet Take 17 g by mouth daily.     . potassium chloride SA (K-DUR,KLOR-CON) 20 MEQ tablet Take 2 tablets (40 mEq total) by mouth daily.    . QUEtiapine Fumarate (SEROQUEL XR) 150 MG 24 hr tablet Take 1 tablet (150 mg total) by mouth at bedtime. 30 tablet 0  . ranitidine (ZANTAC) 150 MG tablet Take 150 mg by mouth 2 (two) times daily as needed for heartburn.    . senna (SENOKOT) 8.6 MG tablet Take 2 tablets by mouth 2 (two) times daily as needed for constipation.    . travoprost, benzalkonium, (TRAVATAN) 0.004 % ophthalmic solution Place 1 drop into both eyes at bedtime.    [name] Salicylate (ASPERCREME) 10 % LOTN Apply 1 application topically 2 (two) times daily as needed (for painful areas).    . collagenase (SANTYL) ointment Apply topically daily. (Patient not taking: Reported on 12/19/2015) 15 g 0    ALLERGIES:   Allergies  Allergen Reactions  . Macrobid [Nitrofurantoin Macrocrystal] Rash  . Azithromycin Other (See Comments)    unknown  . Doxycycline Other (See Comments)    unknown  . Escitalopram Oxalate Other (See Comments)    unknown  . Fioricet [Butalbital-Apap-Caffeine] Other (See Comments)    Drunk.  . Latex Other (See Comments)    unknown  . Levofloxacin Other (See Comments)    unknown  . Nsaids Other (See Comments)    This is not an allergy. The patient was told by Dr. Rockne Menghini to avoid NSAIDs and stop Vicoprofen in to 2014 to avoid nephrotoxicity.   Allergic reaction not listed on MAR.  Marland Kitchen Oxycodone Other (See Comments)    reaction to synthetic codeine  . Restasis [Cyclosporine] Other (See Comments)     unknown  . Sulfa Antibiotics Other (See Comments)    Reaction unknown  . Biaxin [Clarithromycin] Rash    With burning sensation    REVIEW OF SYSTEMS:   Negative except right hip pain  FAMILY HISTORY:   Family History  Problem Relation Age of Onset  . Stroke Mother   . Diabetes Mother   . Heart disease Mother   . Hearing loss Mother   . Cancer Father 6    esophageal  SOCIAL HISTORY:   reports that she has quit smoking. Her smoking use included Cigarettes. She smoked 0.50 packs per day. She has never used smokeless tobacco. She reports that she does not drink alcohol or use drugs.  PHYSICAL EXAM:  General appearance: alert, cooperative and mild distress Extremities: edema right hip deformity Neurologic: Alert and oriented X 3, normal strength and tone. Normal symmetric reflexes. Normal coordination and gait    LABORATORY STUDIES:  Recent Labs  12/19/15 1717 12/20/15 0418  WBC 7.8 7.1  HGB 10.9* 10.5*  HCT 35.2* 34.1*  PLT 285 272     Recent Labs  12/19/15 1717 12/20/15 0418  NA 144 146*  K 3.9 3.8  CL 108 109  CO2 29 28  GLUCOSE 109* 103*  BUN 29* 28*  CREATININE 0.73 0.63  CALCIUM 8.9 9.0    STUDIES/RESULTS:  Dg Chest 1 View  Result Date: 12/19/2015 CLINICAL DATA:  Hip fracture, fall EXAM: CHEST 1 VIEW COMPARISON:  12/15/2015 FINDINGS: Semi-erect view of the chest demonstrates mild hazy bibasilar opacity, probable atelectasis. No large effusion. Cardiomediastinal silhouette stable with atherosclerosis. No pneumothorax. Skin fold artifact over the left chest. IMPRESSION: Hazy bibasilar atelectasis. Atherosclerosis of the aorta Electronically Signed   By: Donavan Foil M.D.   On: 12/19/2015 18:11   Ct Head Wo Contrast  Result Date: 12/11/2015 CLINICAL DATA:  Altered mental status. EXAM: CT HEAD WITHOUT CONTRAST TECHNIQUE: Contiguous axial images were obtained from the base of the skull through the vertex without intravenous contrast. COMPARISON:  CT scan  of November 23, 2015. FINDINGS: Brain: Mild chronic ischemic white matter disease is noted. No mass effect or midline shift is noted. Ventricular size is within normal limits. There is no evidence of mass lesion, hemorrhage or acute infarction. Vascular: Atherosclerosis of carotid siphons is noted. Skull: Bony calvarium appears intact. Sinuses/Orbits: Paranasal sinuses appear normal. Other: None. IMPRESSION: Mild chronic ischemic white matter disease. No acute intracranial abnormality seen. Electronically Signed   By: Marijo Conception, M.D.   On: 12/11/2015 20:09   Ct Head Wo Contrast  Result Date: 11/23/2015 CLINICAL DATA:  Fall. Initial encounter. EXAM: CT HEAD WITHOUT CONTRAST CT CERVICAL SPINE WITHOUT CONTRAST TECHNIQUE: Multidetector CT imaging of the head and cervical spine was performed following the standard protocol without intravenous contrast. Multiplanar CT image reconstructions of the cervical spine were also generated. COMPARISON:  01/26/2015 FINDINGS: CT HEAD FINDINGS Brain: No evidence of acute infarction, hemorrhage, hydrocephalus, extra-axial collection or mass lesion/mass effect. Generalized atrophy. Mild for age chronic microvascular ischemic gliosis in the cerebral white matter. Small remote right cerebellar infarct Vascular: Atherosclerotic calcification. Skull: Scalp lacerations or indentations without underlying fracture or opaque foreign body. Sinuses/Orbits: Bilateral cataract resection.  No acute finding. CT CERVICAL SPINE FINDINGS Alignment: Chronic C4-5 anterolisthesis, measuring 3 mm. There is new kyphotic deformity at C5-6 as described below. Skull base and vertebrae: C3-4 and C5-6 anterior cervical discectomy with solid bony fusion. C6-7 non segmentation. Interval fracture through the fused C5-6 vertebrae with kyphotic deformity. Fracture is still seen across the posterior elements at the level of the facets and lamina. The fracture appears chronic, with healed appearance to the  vertebral body fracture. No acute fracture is detected. Soft tissues and spinal canal: No prevertebral fluid or swelling. No visible canal hematoma. Disc levels: Diffuse disc and facet degeneration. Bulky transverse ligamentous thickening without foramen magnum stenosis. Upper chest: Pleural based scarring seen at the left apex. IMPRESSION: 1. No evidence of acute intracranial or cervical spine  injury. 2. Interval but remote fracture across the C5-6 fused vertebral bodies and posterior elements with healed kyphotic deformity. Incomplete posterior element fracture lines are still visible. 3. Previous C3-4 and C5-6 ACDF. C6-7 non segmentation. Severe adjacent segment facet degeneration at C4-5 with chronic 3 mm anterolisthesis. Electronically Signed   By: Monte Fantasia M.D.   On: 11/23/2015 17:29   Ct Cervical Spine Wo Contrast  Result Date: 11/23/2015 CLINICAL DATA:  Fall. Initial encounter. EXAM: CT HEAD WITHOUT CONTRAST CT CERVICAL SPINE WITHOUT CONTRAST TECHNIQUE: Multidetector CT imaging of the head and cervical spine was performed following the standard protocol without intravenous contrast. Multiplanar CT image reconstructions of the cervical spine were also generated. COMPARISON:  01/26/2015 FINDINGS: CT HEAD FINDINGS Brain: No evidence of acute infarction, hemorrhage, hydrocephalus, extra-axial collection or mass lesion/mass effect. Generalized atrophy. Mild for age chronic microvascular ischemic gliosis in the cerebral white matter. Small remote right cerebellar infarct Vascular: Atherosclerotic calcification. Skull: Scalp lacerations or indentations without underlying fracture or opaque foreign body. Sinuses/Orbits: Bilateral cataract resection.  No acute finding. CT CERVICAL SPINE FINDINGS Alignment: Chronic C4-5 anterolisthesis, measuring 3 mm. There is new kyphotic deformity at C5-6 as described below. Skull base and vertebrae: C3-4 and C5-6 anterior cervical discectomy with solid bony fusion.  C6-7 non segmentation. Interval fracture through the fused C5-6 vertebrae with kyphotic deformity. Fracture is still seen across the posterior elements at the level of the facets and lamina. The fracture appears chronic, with healed appearance to the vertebral body fracture. No acute fracture is detected. Soft tissues and spinal canal: No prevertebral fluid or swelling. No visible canal hematoma. Disc levels: Diffuse disc and facet degeneration. Bulky transverse ligamentous thickening without foramen magnum stenosis. Upper chest: Pleural based scarring seen at the left apex. IMPRESSION: 1. No evidence of acute intracranial or cervical spine injury. 2. Interval but remote fracture across the C5-6 fused vertebral bodies and posterior elements with healed kyphotic deformity. Incomplete posterior element fracture lines are still visible. 3. Previous C3-4 and C5-6 ACDF. C6-7 non segmentation. Severe adjacent segment facet degeneration at C4-5 with chronic 3 mm anterolisthesis. Electronically Signed   By: Monte Fantasia M.D.   On: 11/23/2015 17:29   Dg Chest Port 1 View  Result Date: 12/15/2015 CLINICAL DATA:  82 year old female with pneumonia. Subsequent encounter. EXAM: PORTABLE CHEST 1 VIEW COMPARISON:  12/11/2015, 08/12/2015 and 01/26/2015. FINDINGS: Slightly asymmetric airspace disease greater on the right which may represent a mild pulmonary edema. No segmental consolidation. Prominent skin fold on left without pneumothorax. Calcified tortuous aorta. Heart size within normal limits. No plain film evidence of pulmonary malignancy. Shoulder joint degenerative changes and scoliosis thoracic spine. IMPRESSION: Findings suggestive of mild pulmonary edema. Electronically Signed   By: Genia Del M.D.   On: 12/15/2015 13:08   Dg Chest Port 1 View  Result Date: 12/11/2015 CLINICAL DATA:  Back pain. EXAM: PORTABLE CHEST 1 VIEW COMPARISON:  August 12, 2015 FINDINGS: The heart size borderline. The hila and  mediastinum are unchanged. No pulmonary nodules or masses. Mild interstitial prominence in the right chest, new in the interval. No other acute abnormalities. IMPRESSION: Mild interstitial prominence in the right lung, asymmetric to the left could represent asymmetric edema or an atypical infection. Recommend clinical correlation and follow-up to resolution. Electronically Signed   By: Dorise Bullion III M.D   On: 12/11/2015 16:16   Dg Hip Unilat With Pelvis 2-3 Views Right  Result Date: 12/19/2015 CLINICAL DATA:  Fall at nursing facility EXAM: [place] (  WITH OR WITHOUT PELVIS) 2-3V RIGHT COMPARISON:  01/27/2015 FINDINGS: The left hip demonstrates normal positioning. The pubic symphysis is intact. There is a mildly comminuted right femoral neck fracture with mild superior migration of the distal femur. The right femoral head remains seated within the acetabulum. There is possible underlying lucency within the superolateral femoral neck. There is mild varus angulation. IMPRESSION: 1. Mildly comminuted, displaced and angulated right femoral neck fracture. Possible lucency within the underlying superolateral femoral neck, pathologic fracture cannot be excluded. 2. The left hip is within normal limits Electronically Signed   By: Donavan Foil M.D.   On: 12/19/2015 18:09    ASSESSMENT: right femoral neck fracture  PLAN: she needs a right hip hemiarthroplasty once cleared by medicine    Marites Nath,STEPHEN D 12/20/2015. 8:19 AM

## 2015-12-20 NOTE — Evaluation (Signed)
SLP Cancellation Note  Patient Details Name: CALYSSA ZOBRIST MRN: [id number] DOB: 01-13-1929   Cancelled treatment:       Reason Eval/Treat Not Completed: Other (comment);Medical issues which prohibited therapy (pt for potential surgery, RN advised she will page if pt is not to have surgery)  Luanna Salk, [name] SLP [phone]

## 2015-12-20 NOTE — Progress Notes (Addendum)
PROGRESS NOTE                                                                                                                                                                                                             Patient Demographics:    Taylor Roth, is a 82 y.o. female, DOB - 12-Dec-1928, CBJ:[id number]  Admit date - 12/19/2015   Admitting Physician Annita Brod, MD  Outpatient Primary MD for the patient is Pcp Not In System  LOS - 1    No chief complaint on file.      Brief Narrative   82 year old female with bipolar disorder, question mild dementia, hypertension, benzos and narcotics use, CK D stage IV, COPD, GERD, depression who was recently hospitalized for pneumonia and COPD exacerbation and discharged to skilled nursing facility. Patient was participating with physical therapy at the facility but reportedly had an unwitnessed fall 2 days prior to admission. Since then she was not participating with PT and remained in bed. When daughter went to visit her on the day of admission she was found to be very confused and lethargic. X-ray done at [facility] showed right hip fracture and was sent to the hospital.  Vitals on admission was stable. Blood work showed a normal WBC, hemoglobin of 10.9, normal electrolytes except for a BUN of 29. Chest x-ray showed bibasilar atelectasis, no infiltrate and UA was unremarkable. Patient admitted to hospitalist service and orthopedics consulted.   Subjective:   Patient is awake and alert this morning but remains very confused.   Assessment  & Plan :    Principal Problem:   Closed right hip fracture, initial encounter ([place]) Suspect secondary to mechanical fall. Vicodin when necessary for pain control. Foley placed on admission. Added bowel regimen. Patient does not have any cardiac history and her COPD seems to be well under control. Patient is on metoprolol which I will  continue. EKG is unremarkable. Patient is at mild-to-moderate perioperative cardiac risk for surgery. At this time she does not need any further preoperative cardiac testing.  Active Problems: Acute encephalopathy I suspect this is multifactorial including recent pneumonia, hospitalization and use of narcotics. (Was on Norco as needed since discharge). Avoid benzodiazepines and minimize narcotics. Per daughter patient at baseline is oriented x 2 and well communicative. She has noticed her mentation to decline since her  last hospitalization.    COPD (chronic obstructive pulmonary disease) (HCC) Appears stable. Continue home inhalers.    Protein-calorie malnutrition, severe ([place]) Nutritionist consulted.    CKD (chronic kidney disease), stage III Renal function stable. Continue to monitor.     Decubitus ulcer of hip, stage 3 (HCC) Seen by wound care during recent hospitalization. Recommend mattress replacement for pressure redistribution. prevalon boots for heel pressure injury protection.  Depression/bipolar disorder Resume home medications.     Code Status : DO NOT RESUSCITATE  Family Communication  : Daughter at bedside  Disposition Plan  : Needs SNF  Barriers For Discharge : Active symptoms  Consults  : Dr. Lorre Nick (orthopedics)  Procedures  : Hip surgery today  DVT Prophylaxis  : Lovenox  Lab Results  Component Value Date   PLT 272 12/20/2015    Antibiotics  :    Anti-infectives    None        Objective:   Vitals:   12/19/15 1817 12/19/15 2109 12/20/15 0500 12/20/15 1000  BP: (!) 159/82 (!) 142/82 (!) 131/55 (!) 142/64  Pulse: 70 (!) 102 60 80  Resp: 16 16 16 15   Temp: 98.7 F (37.1 C) 97 F (36.1 C) 97 F (36.1 C) 98.1 F (36.7 C)  TempSrc: Axillary Axillary Axillary Oral  SpO2: 100% 100% 100% 100%  Weight:      Height:        Wt Readings from Last 3 Encounters:  12/19/15 45.2 kg (99 lb 10.4 oz)  12/19/15 45.4 kg (100 lb)  12/16/15 46.3 kg  (102 lb 1.2 oz)     Intake/Output Summary (Last 24 hours) at 12/20/15 1151 Last data filed at 12/20/15 1000  Gross per 24 hour  Intake                0 ml  Output              950 ml  Net             -950 ml     Physical Exam  Gen: not in distress, confused HEENT: no pallor, moist mucosa, supple neck, temporal wasting Chest: clear b/l, no added sounds CVS: N S1&S2, no murmurs, rubs or gallop GI: soft, NT, ND, BS+, Foley placed on admission Musculoskeletal: warm, no edema, limited mobility of the Right hip CNS: AAOx0, confused,    Data Review:    CBC  Recent Labs Lab 12/15/15 12/15/15 0344 12/19/15 1717 12/20/15 0418  WBC 6.1 6.1 7.8 7.1  HGB  --  10.9* 10.9* 10.5*  HCT  --  34.1* 35.2* 34.1*  PLT  --  259 285 272  MCV  --  91.9 93.9 93.9  MCH  --  29.4 29.1 28.9  MCHC  --  32.0 31.0 30.8  RDW  --  13.5 13.9 13.7  LYMPHSABS  --   --  1.4  --   MONOABS  --   --  1.0  --   EOSABS  --   --  0.1  --   BASOSABS  --   --  0.0  --     Chemistries   Recent Labs Lab 12/15/15 12/15/15 0344 12/19/15 1717 12/20/15 0418  NA 142 142 144 146*  K  --  3.9 3.9 3.8  CL  --  111 108 109  CO2  --  23 29 28   GLUCOSE  --  95 109* 103*  BUN 15 15 29* 28*  CREATININE 0.7 0.74 0.73  0.63  CALCIUM  --  8.9 8.9 9.0  MG  --  1.5*  --   --   AST  --   --  61*  --   ALT  --   --  46  --   ALKPHOS  --   --  52  --   BILITOT  --   --  0.6  --    ------------------------------------------------------------------------------------------------------------------ No results for input(s): CHOL, HDL, LDLCALC, TRIG, CHOLHDL, LDLDIRECT in the last 72 hours.  No results found for: HGBA1C ------------------------------------------------------------------------------------------------------------------ No results for input(s): TSH, T4TOTAL, T3FREE, THYROIDAB in the last 72 hours.  Invalid input(s):  FREET3 ------------------------------------------------------------------------------------------------------------------ No results for input(s): VITAMINB12, FOLATE, FERRITIN, TIBC, IRON, RETICCTPCT in the last 72 hours.  Coagulation profile  Recent Labs Lab 12/19/15 1717  INR 1.16    No results for input(s): DDIMER in the last 72 hours.  Cardiac Enzymes No results for input(s): CKMB, TROPONINI, MYOGLOBIN in the last 168 hours.  Invalid input(s): CK ------------------------------------------------------------------------------------------------------------------ No results found for: BNP  Inpatient Medications  Scheduled Meds: . divalproex  500 mg Oral Q breakfast  . dorzolamide-timolol  1 drop Both Eyes BID  . fludrocortisone  0.1 mg Oral q morning - 10a  . latanoprost  1 drop Both Eyes QHS  . polyethylene glycol  17 g Oral Daily  . QUEtiapine Fumarate  150 mg Oral QHS   Continuous Infusions: PRN Meds:.acetaminophen, albuterol, HYDROcodone-acetaminophen  Micro Results Recent Results (from the past 240 hour(s))  Blood Culture (routine x 2)     Status: None   Collection Time: 12/11/15  3:40 PM  Result Value Ref Range Status   Specimen Description BLOOD BLOOD RIGHT FOREARM  Final   Special Requests IN PEDIATRIC BOTTLE [place]  Final   Culture   Final    NO GROWTH 5 DAYS Performed at [facility]    Report Status 12/16/2015 FINAL  Final  Blood Culture (routine x 2)     Status: None   Collection Time: 12/11/15  3:49 PM  Result Value Ref Range Status   Specimen Description BLOOD RIGHT ANTECUBITAL  Final   Special Requests BOTTLES DRAWN AEROBIC AND ANAEROBIC 5CC  Final   Culture   Final    NO GROWTH 5 DAYS Performed at [facility]    Report Status 12/16/2015 FINAL  Final  Wound or Superficial Culture     Status: None   Collection Time: 12/11/15  7:08 PM  Result Value Ref Range Status   Specimen Description ULCER  Final   Special Requests NONE  Final    Gram Stain   Final    FEW WBC PRESENT,BOTH PMN AND MONONUCLEAR FEW GRAM VARIABLE ROD RARE GRAM POSITIVE COCCI    Culture   Final    ABUNDANT MORGANELLA MORGANII MODERATE STAPHYLOCOCCUS SPECIES (COAGULASE NEGATIVE) CALL MICROBIOLOGY LAB IF SENSITIVITIES ARE REQUIRED. Performed at [facility]    Report Status 12/14/2015 FINAL  Final   Organism ID, Bacteria MORGANELLA MORGANII  Final      Susceptibility   Morganella morganii - MIC*    AMPICILLIN >=32 RESISTANT Resistant     CEFAZOLIN >=64 RESISTANT Resistant     CEFEPIME <=1 SENSITIVE Sensitive     CEFTAZIDIME <=1 SENSITIVE Sensitive     CEFTRIAXONE <=1 SENSITIVE Sensitive     CIPROFLOXACIN 1 SENSITIVE Sensitive     GENTAMICIN <=1 SENSITIVE Sensitive     IMIPENEM 4 SENSITIVE Sensitive     TRIMETH/SULFA >=320 RESISTANT Resistant  AMPICILLIN/SULBACTAM 16 INTERMEDIATE Intermediate     PIP/TAZO <=4 SENSITIVE Sensitive     * ABUNDANT MORGANELLA MORGANII  MRSA PCR Screening     Status: None   Collection Time: 12/11/15  9:22 PM  Result Value Ref Range Status   MRSA by PCR NEGATIVE NEGATIVE Final    Comment:        The GeneXpert MRSA Assay (FDA approved for NASAL specimens only), is one component of a comprehensive MRSA colonization surveillance program. It is not intended to diagnose MRSA infection nor to guide or monitor treatment for MRSA infections.     Radiology Reports Dg Chest 1 View  Result Date: 12/19/2015 CLINICAL DATA:  Hip fracture, fall EXAM: CHEST 1 VIEW COMPARISON:  12/15/2015 FINDINGS: Semi-erect view of the chest demonstrates mild hazy bibasilar opacity, probable atelectasis. No large effusion. Cardiomediastinal silhouette stable with atherosclerosis. No pneumothorax. Skin fold artifact over the left chest. IMPRESSION: Hazy bibasilar atelectasis. Atherosclerosis of the aorta Electronically Signed   By: Donavan Foil M.D.   On: 12/19/2015 18:11   Ct Head Wo Contrast  Result Date:  12/11/2015 CLINICAL DATA:  Altered mental status. EXAM: CT HEAD WITHOUT CONTRAST TECHNIQUE: Contiguous axial images were obtained from the base of the skull through the vertex without intravenous contrast. COMPARISON:  CT scan of November 23, 2015. FINDINGS: Brain: Mild chronic ischemic white matter disease is noted. No mass effect or midline shift is noted. Ventricular size is within normal limits. There is no evidence of mass lesion, hemorrhage or acute infarction. Vascular: Atherosclerosis of carotid siphons is noted. Skull: Bony calvarium appears intact. Sinuses/Orbits: Paranasal sinuses appear normal. Other: None. IMPRESSION: Mild chronic ischemic white matter disease. No acute intracranial abnormality seen. Electronically Signed   By: Marijo Conception, M.D.   On: 12/11/2015 20:09   Ct Head Wo Contrast  Result Date: 11/23/2015 CLINICAL DATA:  Fall. Initial encounter. EXAM: CT HEAD WITHOUT CONTRAST CT CERVICAL SPINE WITHOUT CONTRAST TECHNIQUE: Multidetector CT imaging of the head and cervical spine was performed following the standard protocol without intravenous contrast. Multiplanar CT image reconstructions of the cervical spine were also generated. COMPARISON:  01/26/2015 FINDINGS: CT HEAD FINDINGS Brain: No evidence of acute infarction, hemorrhage, hydrocephalus, extra-axial collection or mass lesion/mass effect. Generalized atrophy. Mild for age chronic microvascular ischemic gliosis in the cerebral white matter. Small remote right cerebellar infarct Vascular: Atherosclerotic calcification. Skull: Scalp lacerations or indentations without underlying fracture or opaque foreign body. Sinuses/Orbits: Bilateral cataract resection.  No acute finding. CT CERVICAL SPINE FINDINGS Alignment: Chronic C4-5 anterolisthesis, measuring 3 mm. There is new kyphotic deformity at C5-6 as described below. Skull base and vertebrae: C3-4 and C5-6 anterior cervical discectomy with solid bony fusion. C6-7 non segmentation.  Interval fracture through the fused C5-6 vertebrae with kyphotic deformity. Fracture is still seen across the posterior elements at the level of the facets and lamina. The fracture appears chronic, with healed appearance to the vertebral body fracture. No acute fracture is detected. Soft tissues and spinal canal: No prevertebral fluid or swelling. No visible canal hematoma. Disc levels: Diffuse disc and facet degeneration. Bulky transverse ligamentous thickening without foramen magnum stenosis. Upper chest: Pleural based scarring seen at the left apex. IMPRESSION: 1. No evidence of acute intracranial or cervical spine injury. 2. Interval but remote fracture across the C5-6 fused vertebral bodies and posterior elements with healed kyphotic deformity. Incomplete posterior element fracture lines are still visible. 3. Previous C3-4 and C5-6 ACDF. C6-7 non segmentation. Severe adjacent segment facet degeneration at  C4-5 with chronic 3 mm anterolisthesis. Electronically Signed   By: Monte Fantasia M.D.   On: 11/23/2015 17:29   Ct Cervical Spine Wo Contrast  Result Date: 11/23/2015 CLINICAL DATA:  Fall. Initial encounter. EXAM: CT HEAD WITHOUT CONTRAST CT CERVICAL SPINE WITHOUT CONTRAST TECHNIQUE: Multidetector CT imaging of the head and cervical spine was performed following the standard protocol without intravenous contrast. Multiplanar CT image reconstructions of the cervical spine were also generated. COMPARISON:  01/26/2015 FINDINGS: CT HEAD FINDINGS Brain: No evidence of acute infarction, hemorrhage, hydrocephalus, extra-axial collection or mass lesion/mass effect. Generalized atrophy. Mild for age chronic microvascular ischemic gliosis in the cerebral white matter. Small remote right cerebellar infarct Vascular: Atherosclerotic calcification. Skull: Scalp lacerations or indentations without underlying fracture or opaque foreign body. Sinuses/Orbits: Bilateral cataract resection.  No acute finding. CT CERVICAL  SPINE FINDINGS Alignment: Chronic C4-5 anterolisthesis, measuring 3 mm. There is new kyphotic deformity at C5-6 as described below. Skull base and vertebrae: C3-4 and C5-6 anterior cervical discectomy with solid bony fusion. C6-7 non segmentation. Interval fracture through the fused C5-6 vertebrae with kyphotic deformity. Fracture is still seen across the posterior elements at the level of the facets and lamina. The fracture appears chronic, with healed appearance to the vertebral body fracture. No acute fracture is detected. Soft tissues and spinal canal: No prevertebral fluid or swelling. No visible canal hematoma. Disc levels: Diffuse disc and facet degeneration. Bulky transverse ligamentous thickening without foramen magnum stenosis. Upper chest: Pleural based scarring seen at the left apex. IMPRESSION: 1. No evidence of acute intracranial or cervical spine injury. 2. Interval but remote fracture across the C5-6 fused vertebral bodies and posterior elements with healed kyphotic deformity. Incomplete posterior element fracture lines are still visible. 3. Previous C3-4 and C5-6 ACDF. C6-7 non segmentation. Severe adjacent segment facet degeneration at C4-5 with chronic 3 mm anterolisthesis. Electronically Signed   By: Monte Fantasia M.D.   On: 11/23/2015 17:29   Dg Chest Port 1 View  Result Date: 12/15/2015 CLINICAL DATA:  82 year old female with pneumonia. Subsequent encounter. EXAM: PORTABLE CHEST 1 VIEW COMPARISON:  12/11/2015, 08/12/2015 and 01/26/2015. FINDINGS: Slightly asymmetric airspace disease greater on the right which may represent a mild pulmonary edema. No segmental consolidation. Prominent skin fold on left without pneumothorax. Calcified tortuous aorta. Heart size within normal limits. No plain film evidence of pulmonary malignancy. Shoulder joint degenerative changes and scoliosis thoracic spine. IMPRESSION: Findings suggestive of mild pulmonary edema. Electronically Signed   By: Genia Del M.D.   On: 12/15/2015 13:08   Dg Chest Port 1 View  Result Date: 12/11/2015 CLINICAL DATA:  Back pain. EXAM: PORTABLE CHEST 1 VIEW COMPARISON:  August 12, 2015 FINDINGS: The heart size borderline. The hila and mediastinum are unchanged. No pulmonary nodules or masses. Mild interstitial prominence in the right chest, new in the interval. No other acute abnormalities. IMPRESSION: Mild interstitial prominence in the right lung, asymmetric to the left could represent asymmetric edema or an atypical infection. Recommend clinical correlation and follow-up to resolution. Electronically Signed   By: Dorise Bullion III M.D   On: 12/11/2015 16:16   Dg Hip Unilat With Pelvis 2-3 Views Right  Result Date: 12/19/2015 CLINICAL DATA:  Fall at nursing facility EXAM: DG HIP (WITH OR WITHOUT PELVIS) 2-3V RIGHT COMPARISON:  01/27/2015 FINDINGS: The left hip demonstrates normal positioning. The pubic symphysis is intact. There is a mildly comminuted right femoral neck fracture with mild superior migration of the distal femur. The right femoral head  remains seated within the acetabulum. There is possible underlying lucency within the superolateral femoral neck. There is mild varus angulation. IMPRESSION: 1. Mildly comminuted, displaced and angulated right femoral neck fracture. Possible lucency within the underlying superolateral femoral neck, pathologic fracture cannot be excluded. 2. The left hip is within normal limits Electronically Signed   By: Donavan Foil M.D.   On: 12/19/2015 18:09    Time Spent in minutes  25   Louellen Molder M.D on 12/20/2015 at 11:51 AM  Between 7am to 7pm - Pager - [phone]  After 7pm go to [email or web address] - password [email or web address]  [name] -  Office  [phone]

## 2015-12-21 ENCOUNTER — Inpatient Hospital Stay (HOSPITAL_COMMUNITY): Payer: Medicare Other | Admitting: Anesthesiology

## 2015-12-21 ENCOUNTER — Inpatient Hospital Stay (HOSPITAL_COMMUNITY): Payer: Medicare Other

## 2015-12-21 ENCOUNTER — Encounter (HOSPITAL_BASED_OUTPATIENT_CLINIC_OR_DEPARTMENT_OTHER): Payer: Medicare Other

## 2015-12-21 ENCOUNTER — Encounter (HOSPITAL_COMMUNITY)
Admission: AD | Disposition: A | Discharge status: Skilled Nursing Facility | Payer: Self-pay | Source: Ambulatory Visit | Attending: Internal Medicine

## 2015-12-21 DIAGNOSIS — S72001A Fracture of unspecified part of neck of right femur, initial encounter for closed fracture: Principal | ICD-10-CM

## 2015-12-21 HISTORY — PX: HIP ARTHROPLASTY: SHX981

## 2015-12-21 LAB — CBC
HEMATOCRIT: 35.1 % — AB (ref 36.0–46.0)
HEMOGLOBIN: 10.8 g/dL — AB (ref 12.0–15.0)
MCH: 28 pg (ref 26.0–34.0)
MCHC: 30.8 g/dL (ref 30.0–36.0)
MCV: 90.9 fL (ref 78.0–100.0)
Platelets: 287 10*3/uL (ref 150–400)
RBC: 3.86 MIL/uL — AB (ref 3.87–5.11)
RDW: 13.3 % (ref 11.5–15.5)
WBC: 10.4 10*3/uL (ref 4.0–10.5)

## 2015-12-21 LAB — CBC AND DIFFERENTIAL
HCT: 35 % — AB (ref 36–46)
HEMOGLOBIN: 10.8 g/dL — AB (ref 12.0–16.0)
Platelets: 287 10*3/uL (ref 150–399)
WBC: 10.4 10^3/mL

## 2015-12-21 LAB — URINE CULTURE: CULTURE: NO GROWTH

## 2015-12-21 LAB — CREATININE, SERUM: Creatinine, Ser: 0.64 mg/dL (ref 0.44–1.00)

## 2015-12-21 LAB — VITAMIN D 25 HYDROXY (VIT D DEFICIENCY, FRACTURES): Vit D, 25-Hydroxy: 44.9 ng/mL (ref 30.0–100.0)

## 2015-12-21 LAB — SURGICAL PCR SCREEN
MRSA, PCR: NEGATIVE
Staphylococcus aureus: NEGATIVE

## 2015-12-21 LAB — BASIC METABOLIC PANEL: CREATININE: 0.6 mg/dL (ref 0.5–1.1)

## 2015-12-21 SURGERY — HEMIARTHROPLASTY, HIP, DIRECT ANTERIOR APPROACH, FOR FRACTURE
Anesthesia: Spinal | Site: Hip | Laterality: Right

## 2015-12-21 MED ORDER — METOCLOPRAMIDE HCL 5 MG/ML IJ SOLN: 5.0000 mg | Freq: Three times a day (TID) | INTRAMUSCULAR | Status: DC | PRN

## 2015-12-21 MED ORDER — MORPHINE SULFATE (PF) 2 MG/ML IV SOLN: 1.0000 mg | INTRAVENOUS | Status: DC | PRN

## 2015-12-21 MED ORDER — BUPIVACAINE HCL (PF) 0.5 % IJ SOLN
INTRAMUSCULAR | Status: DC | PRN
  Administered 2015-12-21: 10 mg via INTRATHECAL

## 2015-12-21 MED ORDER — LACTATED RINGERS IV SOLN
INTRAVENOUS | Status: DC
  Administered 2015-12-21: 14:00:00 via INTRAVENOUS

## 2015-12-21 MED ORDER — ENOXAPARIN SODIUM 40 MG/0.4ML ~~LOC~~ SOLN: 40.0000 mg | SUBCUTANEOUS | 0 refills | Status: DC

## 2015-12-21 MED ORDER — ENOXAPARIN SODIUM 30 MG/0.3ML ~~LOC~~ SOLN
30.0000 mg | SUBCUTANEOUS | Status: DC
  Administered 2015-12-22 – 2015-12-31 (×10): 30 mg via SUBCUTANEOUS
  Filled 2015-12-21 (×8): qty 0.3

## 2015-12-21 MED ORDER — OXYCODONE HCL 5 MG PO TABS: 5.0000 mg | ORAL_TABLET | Freq: Four times a day (QID) | ORAL | 0 refills | Status: DC | PRN

## 2015-12-21 MED ORDER — PROPOFOL 500 MG/50ML IV EMUL
INTRAVENOUS | Status: DC | PRN
  Administered 2015-12-21: 25 ug/kg/min via INTRAVENOUS

## 2015-12-21 MED ORDER — ACETAMINOPHEN 325 MG PO TABS
650.0000 mg | ORAL_TABLET | Freq: Four times a day (QID) | ORAL | Status: DC | PRN
  Administered 2015-12-23 – 2015-12-30 (×9): 650 mg via ORAL
  Filled 2015-12-21 (×4): qty 2

## 2015-12-21 MED ORDER — DOCUSATE SODIUM 100 MG PO CAPS
100.0000 mg | ORAL_CAPSULE | Freq: Two times a day (BID) | ORAL | Status: DC
  Administered 2015-12-22 – 2015-12-30 (×10): 100 mg via ORAL
  Filled 2015-12-21 (×15): qty 1

## 2015-12-21 MED ORDER — ACETAMINOPHEN 500 MG PO TABS
1000.0000 mg | ORAL_TABLET | Freq: Four times a day (QID) | ORAL | Status: AC
  Administered 2015-12-22 (×3): 1000 mg via ORAL
  Filled 2015-12-21 (×3): qty 2

## 2015-12-21 MED ORDER — FENTANYL CITRATE (PF) 100 MCG/2ML IJ SOLN: 25.0000 ug | INTRAMUSCULAR | Status: DC | PRN

## 2015-12-21 MED ORDER — ONDANSETRON HCL 4 MG PO TABS: 4.0000 mg | ORAL_TABLET | Freq: Four times a day (QID) | ORAL | Status: DC | PRN

## 2015-12-21 MED ORDER — FENTANYL CITRATE (PF) 100 MCG/2ML IJ SOLN
INTRAMUSCULAR | Status: AC
  Filled 2015-12-21: qty 2

## 2015-12-21 MED ORDER — LIDOCAINE HCL (CARDIAC) 20 MG/ML IV SOLN
INTRAVENOUS | Status: DC | PRN
  Administered 2015-12-21: 40 mg via INTRATRACHEAL

## 2015-12-21 MED ORDER — OXYCODONE HCL 5 MG PO TABS: 5.0000 mg | ORAL_TABLET | ORAL | Status: DC | PRN

## 2015-12-21 MED ORDER — FERROUS SULFATE 325 (65 FE) MG PO TABS
325.0000 mg | ORAL_TABLET | Freq: Three times a day (TID) | ORAL | Status: DC
  Administered 2015-12-22 (×3): 325 mg via ORAL
  Filled 2015-12-21 (×4): qty 1

## 2015-12-21 MED ORDER — SODIUM CHLORIDE 0.9 % IR SOLN
Status: DC | PRN
  Administered 2015-12-21: 1000 mL

## 2015-12-21 MED ORDER — POLYETHYLENE GLYCOL 3350 17 G PO PACK: 17.0000 g | PACK | Freq: Every day | ORAL | Status: DC | PRN

## 2015-12-21 MED ORDER — ONDANSETRON HCL 4 MG/2ML IJ SOLN: 4.0000 mg | Freq: Four times a day (QID) | INTRAMUSCULAR | Status: DC | PRN

## 2015-12-21 MED ORDER — METOCLOPRAMIDE HCL 5 MG PO TABS: 5.0000 mg | ORAL_TABLET | Freq: Three times a day (TID) | ORAL | Status: DC | PRN

## 2015-12-21 MED ORDER — ACETAMINOPHEN 650 MG RE SUPP
650.0000 mg | Freq: Four times a day (QID) | RECTAL | Status: DC | PRN
  Administered 2015-12-22 – 2015-12-24 (×2): 650 mg via RECTAL
  Filled 2015-12-21 (×2): qty 1

## 2015-12-21 MED ORDER — PROPOFOL 10 MG/ML IV BOLUS
INTRAVENOUS | Status: DC | PRN
  Administered 2015-12-21: 20 mg via INTRAVENOUS
  Administered 2015-12-21 (×6): 10 mg via INTRAVENOUS

## 2015-12-21 MED ORDER — SODIUM CHLORIDE 0.9 % IV SOLN
INTRAVENOUS | Status: DC
  Administered 2015-12-22: 50 mL/h via INTRAVENOUS

## 2015-12-21 MED ORDER — CEFAZOLIN IN D5W 1 GM/50ML IV SOLN
1.0000 g | Freq: Four times a day (QID) | INTRAVENOUS | Status: AC
  Administered 2015-12-21 – 2015-12-22 (×3): 1 g via INTRAVENOUS
  Filled 2015-12-21 (×4): qty 50

## 2015-12-21 MED ORDER — BUPIVACAINE HCL (PF) 0.25 % IJ SOLN
INTRAMUSCULAR | Status: AC
  Filled 2015-12-21: qty 30

## 2015-12-21 SURGICAL SUPPLY — 47 items
BLADE SAGITTAL (BLADE) ×2
BLADE SAW THK.89X75X18XSGTL (BLADE) ×2 IMPLANT
CAPT HIP HEMI 2 ×4 IMPLANT
COVER SURGICAL LIGHT HANDLE (MISCELLANEOUS) ×4 IMPLANT
DRAPE HIP W/POCKET STRL (DRAPE) ×4 IMPLANT
DRAPE IMP U-DRAPE 54X76 (DRAPES) ×4 IMPLANT
DRAPE INCISE IOBAN 85X60 (DRAPES) ×4 IMPLANT
DRAPE U-SHAPE 47X51 STRL (DRAPES) ×4 IMPLANT
DRSG AQUACEL AG ADV 3.5X10 (GAUZE/BANDAGES/DRESSINGS) ×4 IMPLANT
DURAPREP 26ML APPLICATOR (WOUND CARE) ×8 IMPLANT
ELECT BLADE 4.0 EZ CLEAN MEGAD (MISCELLANEOUS)
ELECT CAUTERY BLADE 6.4 (BLADE) ×4 IMPLANT
ELECT REM PT RETURN 9FT ADLT (ELECTROSURGICAL) ×4
ELECTRODE BLDE 4.0 EZ CLN MEGD (MISCELLANEOUS) IMPLANT
ELECTRODE REM PT RTRN 9FT ADLT (ELECTROSURGICAL) ×2 IMPLANT
FACESHIELD WRAPAROUND (MASK) ×8 IMPLANT
GLOVE BIO SURGEON STRL SZ7.5 (GLOVE) ×4 IMPLANT
GLOVE BIOGEL PI IND STRL 6.5 (GLOVE) ×2 IMPLANT
GLOVE BIOGEL PI IND STRL 8 (GLOVE) ×2 IMPLANT
GLOVE BIOGEL PI INDICATOR 6.5 (GLOVE) ×2
GLOVE BIOGEL PI INDICATOR 8 (GLOVE) ×2
GLOVE INDICATOR 7.5 STRL GRN (GLOVE) ×4 IMPLANT
GLOVE SURG SS PI 6.5 STRL IVOR (GLOVE) ×4 IMPLANT
GOWN STRL REUS W/ TWL LRG LVL3 (GOWN DISPOSABLE) ×10 IMPLANT
GOWN STRL REUS W/ TWL XL LVL3 (GOWN DISPOSABLE) ×2 IMPLANT
GOWN STRL REUS W/TWL LRG LVL3 (GOWN DISPOSABLE) ×10
GOWN STRL REUS W/TWL XL LVL3 (GOWN DISPOSABLE) ×2
KIT BASIN OR (CUSTOM PROCEDURE TRAY) ×4 IMPLANT
KIT ROOM TURNOVER OR (KITS) ×4 IMPLANT
MANIFOLD NEPTUNE II (INSTRUMENTS) ×4 IMPLANT
NS IRRIG 1000ML POUR BTL (IV SOLUTION) ×4 IMPLANT
PACK TOTAL JOINT (CUSTOM PROCEDURE TRAY) ×4 IMPLANT
PAD ARMBOARD 7.5X6 YLW CONV (MISCELLANEOUS) ×8 IMPLANT
PENCIL BUTTON HOLSTER BLD 10FT (ELECTRODE) ×4 IMPLANT
PILLOW ABDUCTION HIP (SOFTGOODS) ×4 IMPLANT
STAPLER VISISTAT 35W (STAPLE) ×8 IMPLANT
SUT ETHIBOND 2 V 37 (SUTURE) ×4 IMPLANT
SUT MON AB 2-0 CT1 36 (SUTURE) ×8 IMPLANT
SUT PDS AB 1 CT  36 (SUTURE) ×2
SUT PDS AB 1 CT 36 (SUTURE) ×2 IMPLANT
SUT VIC AB 0 CT1 27 (SUTURE)
SUT VIC AB 0 CT1 27XBRD ANBCTR (SUTURE) IMPLANT
SUT VLOC 180 0 24IN GS25 (SUTURE) ×4 IMPLANT
SYR CONTROL 10ML LL (SYRINGE) ×4 IMPLANT
TOWEL OR 17X24 6PK STRL BLUE (TOWEL DISPOSABLE) ×4 IMPLANT
TOWEL OR 17X26 10 PK STRL BLUE (TOWEL DISPOSABLE) ×4 IMPLANT
YANKAUER SUCT BULB TIP NO VENT (SUCTIONS) ×8 IMPLANT

## 2015-12-21 NOTE — Progress Notes (Signed)
Pt arrived on floor with EMS from [facility]. Pt in stable condition. Altered mental status as baseline. VS stable. Pt calm,bed lowered and call bell in reach.

## 2015-12-21 NOTE — Anesthesia Procedure Notes (Signed)
Spinal  Patient location during procedure: OR Start time: 12/21/2015 2:18 PM End time: 12/21/2015 2:21 PM Staffing Anesthesiologist: Roderic Palau Performed: anesthesiologist  Preanesthetic Checklist Completed: patient identified, surgical consent, pre-op evaluation, timeout performed, IV checked, risks and benefits discussed and monitors and equipment checked Spinal Block Patient position: left lateral decubitus Prep: DuraPrep Patient monitoring: cardiac monitor, continuous pulse ox and blood pressure Approach: midline Location: L3-4 Injection technique: single-shot Needle Needle type: Quincke  Needle gauge: 22 G Needle length: 9 cm Assessment Sensory level: T8 Additional Notes Functioning IV was confirmed and monitors were applied. Sterile prep and drape, including hand hygiene and sterile gloves were used. The patient was positioned and the spine was prepped. The skin was anesthetized with lidocaine.  Free flow of clear CSF was obtained prior to injecting local anesthetic into the CSF.  The spinal needle aspirated freely following injection.  The needle was carefully withdrawn.  The patient tolerated the procedure well.

## 2015-12-21 NOTE — Discharge Instructions (Signed)
Posterior hip precautions Maintain aquacel dressing to right hip until follow up appointment [place] for full weight bearing on right leg Ok to shower but do not submerge wound under water

## 2015-12-21 NOTE — Anesthesia Preprocedure Evaluation (Addendum)
Anesthesia Evaluation  Patient identified by MRN, date of birth, ID band Patient confused    Reviewed: Allergy & Precautions, H&P , NPO status , Patient's Chart, lab work & pertinent test results, reviewed documented beta blocker date and time   Airway Mallampati: II  TM Distance: >3 FB Neck ROM: Full    Dental no notable dental hx. (+) Edentulous Upper, Edentulous Lower, Dental Advisory Given   Pulmonary COPD,  COPD inhaler, former smoker,    Pulmonary exam normal breath sounds clear to auscultation       Cardiovascular Pt. on home beta blockers negative cardio ROS   Rhythm:Regular Rate:Normal     Neuro/Psych  Headaches, Anxiety Depression    GI/Hepatic Neg liver ROS, GERD  Medicated and Controlled,  Endo/Other  negative endocrine ROS  Renal/GU Renal disease  negative genitourinary   Musculoskeletal   Abdominal   Peds  Hematology negative hematology ROS (+) anemia ,   Anesthesia Other Findings   Reproductive/Obstetrics negative OB ROS                          Anesthesia Physical Anesthesia Plan  ASA: III  Anesthesia Plan: Spinal   Post-op Pain Management:    Induction: Intravenous  Airway Management Planned: Simple Face Mask  Additional Equipment:   Intra-op Plan:   Post-operative Plan:   Informed Consent: I have reviewed the patients History and Physical, chart, labs and discussed the procedure including the risks, benefits and alternatives for the proposed anesthesia with the patient or authorized representative who has indicated his/her understanding and acceptance.   Dental advisory given  Plan Discussed with: CRNA and Surgeon  Anesthesia Plan Comments:        Anesthesia Quick Evaluation

## 2015-12-21 NOTE — Progress Notes (Signed)
SLP Cancellation Note  Patient Details Name: Taylor Roth MRN: [id number] DOB: March 18, 1928   Cancelled treatment:       Reason Eval/Treat Not Completed: Medical issues which prohibited therapy;Patient at procedure or test/unavailable. Pt will have surgery today, NPO. Will f/u for diet tolerance tomorrow, after sx.    Tivis Wherry, Katherene Ponto 12/21/2015, 9:34 AM

## 2015-12-21 NOTE — Progress Notes (Addendum)
PROGRESS NOTE                                                                                                                                                      Patient Demographics:    Taylor Roth, is a 82 y.o. female, DOB - January 05, 1929, IRJ:[id number]  Admit date - 12/19/2015   Admitting Physician MAGICK-Jafet Wissing  LOS - 2  Brief Narrative   82 year old female with bipolar disorder, mild dementia, hypertension, benzos and narcotics use, CKD stage IV, COPD, GERD, depression who was recently hospitalized for pneumonia and COPD exacerbation and discharged to skilled nursing facility. Patient was participating with physical therapy at the facility but reportedly had an unwitnessed fall 2 days prior to admission. Since then she was not participating with PT and remained in bed. When daughter went to visit her on the day of admission she was found to be very confused and lethargic.   Subjective:   Patient is more lethargic this AM, able to open eyes but quickly    Assessment  & Plan :   Principal Problem: Closed right hip fracture, initial encounter ([place]) - Suspect secondary to mechanical fall. - pt rather lethargic this AM, would hold off on analgesia for now until pt more awake  - ortho team following, appreciate assistance  - Patient does not have any cardiac history and her COPD seems to be well under control - Patient determined to be mild-to-moderate perioperative cardiac risk for surgery, plan to take to OR today   Active Problems: Acute encephalopathy - recent pneumonia, hospitalization and use of narcotics. - avoid narcotics for now until pt more alert   Hypernatremia - mild, suspect from pre renal etiology,  continue with IVF - BMP in AM  COPD (chronic obstructive pulmonary disease) ([place]) - maintaining oxygen saturations at target range  Protein-calorie malnutrition, severe ([place]) - Nutritionist  consulted.  CKD (chronic kidney disease), stage III - Renal function stable. Continue to monitor.  Decubitus ulcer of hip, stage 3 (HCC) - Seen by wound care  - Recommend mattress replacement for pressure redistribution, prevalon boots for heel pressure injury protection.  Depression/bipolar disorder - continue home medications  Code Status : DO NOT RESUSCITATE  Family Communication  : No family at bedside   Disposition Plan  : Needs SNF  Consults  : Dr. Lorre Nick (orthopedics)  Procedures  : Hip surgery   DVT Prophylaxis  : Lovenox SQ  Lab Results  Component Value Date   PLT 272 12/20/2015    Antibiotics  :  Pre op  Anti-infectives    Start     Dose/Rate Route Frequency Ordered  Stop   12/21/15 1330  ceFAZolin (ANCEF) IVPB 2g/100 mL premix     2 g 200 mL/hr over 30 Minutes Intravenous To [facility]Stay Surgical 12/20/15 2217 12/22/15 1330        Objective:   Vitals:   12/20/15 2136 12/20/15 2220 12/21/15 0622 12/21/15 1201  BP: (!) 103/59 (!) 151/89 (!) 158/95 (!) 145/80  Pulse: 71 68 94 81  Resp: 16 16 17 16   Temp: 98.4 F (36.9 C) 98.2 F (36.8 C) 98.7 F (37.1 C) 99.5 F (37.5 C)  TempSrc:  Oral Oral Oral  SpO2: 98% 100% 94% 98%  Weight:      Height:        Wt Readings from Last 3 Encounters:  12/19/15 45.2 kg (99 lb 10.4 oz)  12/19/15 45.4 kg (100 lb)  12/16/15 46.3 kg (102 lb 1.2 oz)     Intake/Output Summary (Last 24 hours) at 12/21/15 1248 Last data filed at 12/21/15 0900  Gross per 24 hour  Intake               80 ml  Output              500 ml  Net             -420 ml   Physical Exam Gen: lethargic, able to open eyes with sternal rub and, doses off very quickly  HEENT: no pallor, dry MM, supple neck, temporal wasting Chest: diminished breath sounds at bases  CVS: N S1&S2, no murmurs, rubs or gallop GI: soft, NT, ND, BS+, Foley placed on admission   Data Review:   CBC  Recent Labs Lab 12/15/15 12/15/15 0344 12/19/15 1717  12/20/15 0418  WBC 6.1 6.1 7.8 7.1  HGB  --  10.9* 10.9* 10.5*  HCT  --  34.1* 35.2* 34.1*  PLT  --  259 285 272  MCV  --  91.9 93.9 93.9  MCH  --  29.4 29.1 28.9  MCHC  --  32.0 31.0 30.8  RDW  --  13.5 13.9 13.7  LYMPHSABS  --   --  1.4  --   MONOABS  --   --  1.0  --   EOSABS  --   --  0.1  --   BASOSABS  --   --  0.0  --     Chemistries   Recent Labs Lab 12/15/15 12/15/15 0344 12/19/15 1717 12/20/15 0418  NA 142 142 144 146*  K  --  3.9 3.9 3.8  CL  --  111 108 109  CO2  --  23 29 28   GLUCOSE  --  95 109* 103*  BUN 15 15 29* 28*  CREATININE 0.7 0.74 0.73 0.63  CALCIUM  --  8.9 8.9 9.0  MG  --  1.5*  --   --   AST  --   --  61*  --   ALT  --   --  46  --   ALKPHOS  --   --  52  --   BILITOT  --   --  0.6  --    Coagulation profile  Recent Labs Lab 12/19/15 1717  INR 1.16   Inpatient Medications  Scheduled Meds: .  ceFAZolin (ANCEF) IV  2 g Intravenous To SS-Surg  . chlorhexidine  60 mL Topical Once  . collagenase   Topical Daily  . divalproex  500 mg Oral Q breakfast  . dorzolamide-timolol  1 drop Both Eyes BID  .  famotidine  10 mg Oral BID  . feeding supplement (ENSURE ENLIVE)  237 mL Oral BID WC  . fludrocortisone  0.1 mg Oral q morning - 10a  . latanoprost  1 drop Both Eyes QHS  . metoprolol tartrate  25 mg Oral BID  . pantoprazole  40 mg Oral Q0600  . polyethylene glycol  17 g Oral Daily  . potassium chloride SA  40 mEq Oral Daily  . povidone-iodine  2 application Topical Once  . QUEtiapine Fumarate  150 mg Oral QHS   Continuous Infusions: PRN Meds:.acetaminophen, albuterol, HYDROcodone-acetaminophen, senna  Micro Results Recent Results (from the past 240 hour(s))  Blood Culture (routine x 2)     Status: None   Collection Time: 12/11/15  3:40 PM  Result Value Ref Range Status   Specimen Description BLOOD BLOOD RIGHT FOREARM  Final   Special Requests IN PEDIATRIC BOTTLE [place]  Final   Culture   Final    NO GROWTH 5 DAYS Performed at [facility]    Report Status 12/16/2015 FINAL  Final  Blood Culture (routine x 2)     Status: None   Collection Time: 12/11/15  3:49 PM  Result Value Ref Range Status   Specimen Description BLOOD RIGHT ANTECUBITAL  Final   Special Requests BOTTLES DRAWN AEROBIC AND ANAEROBIC 5CC  Final   Culture   Final    NO GROWTH 5 DAYS Performed at [facility]    Report Status 12/16/2015 FINAL  Final  Wound or Superficial Culture     Status: None   Collection Time: 12/11/15  7:08 PM  Result Value Ref Range Status   Specimen Description ULCER  Final   Special Requests NONE  Final   Gram Stain   Final    FEW WBC PRESENT,BOTH PMN AND MONONUCLEAR FEW GRAM VARIABLE ROD RARE GRAM POSITIVE COCCI    Culture   Final    ABUNDANT MORGANELLA MORGANII MODERATE STAPHYLOCOCCUS SPECIES (COAGULASE NEGATIVE) CALL MICROBIOLOGY LAB IF SENSITIVITIES ARE REQUIRED. Performed at [facility]    Report Status 12/14/2015 FINAL  Final   Organism ID, Bacteria MORGANELLA MORGANII  Final      Susceptibility   Morganella morganii - MIC*    AMPICILLIN >=32 RESISTANT Resistant     CEFAZOLIN >=64 RESISTANT Resistant     CEFEPIME <=1 SENSITIVE Sensitive     CEFTAZIDIME <=1 SENSITIVE Sensitive     CEFTRIAXONE <=1 SENSITIVE Sensitive     CIPROFLOXACIN 1 SENSITIVE Sensitive     GENTAMICIN <=1 SENSITIVE Sensitive     IMIPENEM 4 SENSITIVE Sensitive     TRIMETH/SULFA >=320 RESISTANT Resistant     AMPICILLIN/SULBACTAM 16 INTERMEDIATE Intermediate     PIP/TAZO <=4 SENSITIVE Sensitive     * ABUNDANT MORGANELLA MORGANII  MRSA PCR Screening     Status: None   Collection Time: 12/11/15  9:22 PM  Result Value Ref Range Status   MRSA by PCR NEGATIVE NEGATIVE Final    Comment:        The GeneXpert MRSA Assay (FDA approved for NASAL specimens only), is one component of a comprehensive MRSA colonization surveillance program. It is not intended to diagnose MRSA infection nor to guide or monitor treatment  for MRSA infections.   Urine culture     Status: None   Collection Time: 12/19/15 11:11 PM  Result Value Ref Range Status   Specimen Description URINE, RANDOM  Final   Special Requests NONE  Final   Culture NO GROWTH  Performed at [facility]   Final   Report Status 12/21/2015 FINAL  Final  Surgical pcr screen     Status: None   Collection Time: 12/20/15 11:32 PM  Result Value Ref Range Status   MRSA, PCR NEGATIVE NEGATIVE Final   Staphylococcus aureus NEGATIVE NEGATIVE Final    Comment:        The Xpert SA Assay (FDA approved for NASAL specimens in patients over 66 years of age), is one component of a comprehensive surveillance program.  Test performance has been validated by [facility] for patients greater than or equal to 36 year old. It is not intended to diagnose infection nor to guide or monitor treatment.     Radiology Reports Dg Chest 1 View  Result Date: 12/19/2015 CLINICAL DATA:  Hip fracture, fall EXAM: CHEST 1 VIEW COMPARISON:  12/15/2015 FINDINGS: Semi-erect view of the chest demonstrates mild hazy bibasilar opacity, probable atelectasis. No large effusion. Cardiomediastinal silhouette stable with atherosclerosis. No pneumothorax. Skin fold artifact over the left chest. IMPRESSION: Hazy bibasilar atelectasis. Atherosclerosis of the aorta Electronically Signed   By: Donavan Foil M.D.   On: 12/19/2015 18:11   Ct Head Wo Contrast  Result Date: 12/11/2015 CLINICAL DATA:  Altered mental status. EXAM: CT HEAD WITHOUT CONTRAST TECHNIQUE: Contiguous axial images were obtained from the base of the skull through the vertex without intravenous contrast. COMPARISON:  CT scan of November 23, 2015. FINDINGS: Brain: Mild chronic ischemic white matter disease is noted. No mass effect or midline shift is noted. Ventricular size is within normal limits. There is no evidence of mass lesion, hemorrhage or acute infarction. Vascular: Atherosclerosis of carotid siphons is  noted. Skull: Bony calvarium appears intact. Sinuses/Orbits: Paranasal sinuses appear normal. Other: None. IMPRESSION: Mild chronic ischemic white matter disease. No acute intracranial abnormality seen. Electronically Signed   By: Marijo Conception, M.D.   On: 12/11/2015 20:09   Ct Head Wo Contrast  Result Date: 11/23/2015 CLINICAL DATA:  Fall. Initial encounter. EXAM: CT HEAD WITHOUT CONTRAST CT CERVICAL SPINE WITHOUT CONTRAST TECHNIQUE: Multidetector CT imaging of the head and cervical spine was performed following the standard protocol without intravenous contrast. Multiplanar CT image reconstructions of the cervical spine were also generated. COMPARISON:  01/26/2015 FINDINGS: CT HEAD FINDINGS Brain: No evidence of acute infarction, hemorrhage, hydrocephalus, extra-axial collection or mass lesion/mass effect. Generalized atrophy. Mild for age chronic microvascular ischemic gliosis in the cerebral white matter. Small remote right cerebellar infarct Vascular: Atherosclerotic calcification. Skull: Scalp lacerations or indentations without underlying fracture or opaque foreign body. Sinuses/Orbits: Bilateral cataract resection.  No acute finding. CT CERVICAL SPINE FINDINGS Alignment: Chronic C4-5 anterolisthesis, measuring 3 mm. There is new kyphotic deformity at C5-6 as described below. Skull base and vertebrae: C3-4 and C5-6 anterior cervical discectomy with solid bony fusion. C6-7 non segmentation. Interval fracture through the fused C5-6 vertebrae with kyphotic deformity. Fracture is still seen across the posterior elements at the level of the facets and lamina. The fracture appears chronic, with healed appearance to the vertebral body fracture. No acute fracture is detected. Soft tissues and spinal canal: No prevertebral fluid or swelling. No visible canal hematoma. Disc levels: Diffuse disc and facet degeneration. Bulky transverse ligamentous thickening without foramen magnum stenosis. Upper chest: Pleural  based scarring seen at the left apex. IMPRESSION: 1. No evidence of acute intracranial or cervical spine injury. 2. Interval but remote fracture across the C5-6 fused vertebral bodies and posterior elements with healed kyphotic deformity. Incomplete posterior element  fracture lines are still visible. 3. Previous C3-4 and C5-6 ACDF. C6-7 non segmentation. Severe adjacent segment facet degeneration at C4-5 with chronic 3 mm anterolisthesis. Electronically Signed   By: Monte Fantasia M.D.   On: 11/23/2015 17:29   Ct Cervical Spine Wo Contrast  Result Date: 11/23/2015 CLINICAL DATA:  Fall. Initial encounter. EXAM: CT HEAD WITHOUT CONTRAST CT CERVICAL SPINE WITHOUT CONTRAST TECHNIQUE: Multidetector CT imaging of the head and cervical spine was performed following the standard protocol without intravenous contrast. Multiplanar CT image reconstructions of the cervical spine were also generated. COMPARISON:  01/26/2015 FINDINGS: CT HEAD FINDINGS Brain: No evidence of acute infarction, hemorrhage, hydrocephalus, extra-axial collection or mass lesion/mass effect. Generalized atrophy. Mild for age chronic microvascular ischemic gliosis in the cerebral white matter. Small remote right cerebellar infarct Vascular: Atherosclerotic calcification. Skull: Scalp lacerations or indentations without underlying fracture or opaque foreign body. Sinuses/Orbits: Bilateral cataract resection.  No acute finding. CT CERVICAL SPINE FINDINGS Alignment: Chronic C4-5 anterolisthesis, measuring 3 mm. There is new kyphotic deformity at C5-6 as described below. Skull base and vertebrae: C3-4 and C5-6 anterior cervical discectomy with solid bony fusion. C6-7 non segmentation. Interval fracture through the fused C5-6 vertebrae with kyphotic deformity. Fracture is still seen across the posterior elements at the level of the facets and lamina. The fracture appears chronic, with healed appearance to the vertebral body fracture. No acute fracture is  detected. Soft tissues and spinal canal: No prevertebral fluid or swelling. No visible canal hematoma. Disc levels: Diffuse disc and facet degeneration. Bulky transverse ligamentous thickening without foramen magnum stenosis. Upper chest: Pleural based scarring seen at the left apex. IMPRESSION: 1. No evidence of acute intracranial or cervical spine injury. 2. Interval but remote fracture across the C5-6 fused vertebral bodies and posterior elements with healed kyphotic deformity. Incomplete posterior element fracture lines are still visible. 3. Previous C3-4 and C5-6 ACDF. C6-7 non segmentation. Severe adjacent segment facet degeneration at C4-5 with chronic 3 mm anterolisthesis. Electronically Signed   By: Monte Fantasia M.D.   On: 11/23/2015 17:29   Dg Chest Port 1 View  Result Date: 12/15/2015 CLINICAL DATA:  82 year old female with pneumonia. Subsequent encounter. EXAM: PORTABLE CHEST 1 VIEW COMPARISON:  12/11/2015, 08/12/2015 and 01/26/2015. FINDINGS: Slightly asymmetric airspace disease greater on the right which may represent a mild pulmonary edema. No segmental consolidation. Prominent skin fold on left without pneumothorax. Calcified tortuous aorta. Heart size within normal limits. No plain film evidence of pulmonary malignancy. Shoulder joint degenerative changes and scoliosis thoracic spine. IMPRESSION: Findings suggestive of mild pulmonary edema. Electronically Signed   By: Genia Del M.D.   On: 12/15/2015 13:08   Dg Chest Port 1 View  Result Date: 12/11/2015 CLINICAL DATA:  Back pain. EXAM: PORTABLE CHEST 1 VIEW COMPARISON:  August 12, 2015 FINDINGS: The heart size borderline. The hila and mediastinum are unchanged. No pulmonary nodules or masses. Mild interstitial prominence in the right chest, new in the interval. No other acute abnormalities. IMPRESSION: Mild interstitial prominence in the right lung, asymmetric to the left could represent asymmetric edema or an atypical infection.  Recommend clinical correlation and follow-up to resolution. Electronically Signed   By: Dorise Bullion III M.D   On: 12/11/2015 16:16   Dg Hip Unilat With Pelvis 2-3 Views Right  Result Date: 12/19/2015 CLINICAL DATA:  Fall at nursing facility EXAM: DG HIP (WITH OR WITHOUT PELVIS) 2-3V RIGHT COMPARISON:  01/27/2015 FINDINGS: The left hip demonstrates normal positioning. The pubic symphysis is intact. There  is a mildly comminuted right femoral neck fracture with mild superior migration of the distal femur. The right femoral head remains seated within the acetabulum. There is possible underlying lucency within the superolateral femoral neck. There is mild varus angulation. IMPRESSION: 1. Mildly comminuted, displaced and angulated right femoral neck fracture. Possible lucency within the underlying superolateral femoral neck, pathologic fracture cannot be excluded. 2. The left hip is within normal limits Electronically Signed   By: Donavan Foil M.D.   On: 12/19/2015 18:09    Time Spent in minutes  25  Faye Ramsay M.D on 12/21/2015 at 12:48 PM  Between 7am to 7pm - Pager - [phone]  After 7pm go to [email or web address] - password [facility]  [name] -  Office  [phone]

## 2015-12-21 NOTE — Anesthesia Procedure Notes (Signed)
Procedure Name: MAC Date/Time: 12/21/2015 2:20 PM Performed by: Mariea Clonts Pre-anesthesia Checklist: Emergency Drugs available, Suction available, Patient being monitored, Timeout performed and Patient identified Patient Re-evaluated:Patient Re-evaluated prior to inductionOxygen Delivery Method: Nasal cannula Intubation Type: IV induction

## 2015-12-21 NOTE — Op Note (Signed)
ARTHROPLASTY BIPOLAR HIP (HEMIARTHROPLASTY)  Taylor Roth   [id number]  Pre-op Diagnosis: Right Hip Fracture     Post-op Diagnosis: same   Operative Procedures  1. Open treatment of femoral fracture, proximal end, neck, prosthetic replacement CPT 27236  Personnel  Surgeon(s): [name], MD   Anesthesia: spinal  Prosthesis: Depuy Corail Femur: 10 mm Head: 46 mm Bearing Type: bipolar  Date of Service: 12/19/2015 - 12/21/2015  Indication: 82 y.o.. year old female who suffered a ground level fall and was found to have sustained a Right hip fracture. The patient was found to have a hip fracture that was appropriate for operative management. We reviewed the risk and benefits with the patient and family and they elected to proceed.  Procedure: After informed consent was obtained and understanding of the risk were voiced including but not limited to bleeding, infection, damage to surrounding structures including nerves and vessels, blood clots, leg length inequality, dislocation and the failure to achieve desired results, including death, the operative extremity was marked with verbal confirmation of the patient in the holding area.   The patient was then brought to the operating room and transported to the operating room table and placed in the lateral decubitus position. The operative limb was then prepped and draped in the usual sterile fashion and preoperative antibiotics were administered.  A time out was performed prior to the start of surgery confirming the correct extremity, preoperative antibiotic administration, as well as team members, and that implants and instruments available for the case. Correct surgical site was also confirmed with preoperative radiographs. A standard posterior approach to the hip was performed. A capsulotomy was performed and the capsule tagged for later repair. The labrum was preserved. The hip was dislocated and the femoral neck cut was made  at approximately 1.5 cm above the level of the lesser trochanter using the femoral neck cutting guide. We then used a corkscrew and cobb to remove the femoral head from the acetabulum. We measured the head and proceeded to trial head sizes and found 46 mm to be the appropriate size. We then turned our attention to femoral preparation. We began sequential broaching to a size 10 stem. This size produced good fit and rotational stability. We placed the trial neck and a 46 mm +1.55mm head.  Leg lengths were evaluated on the table. The hip was stable in extension and external rotation without impingement. The hip was stable in deep flexion. In 90 degrees of flexion and neutral abduction the hip was stable to 80 degrees of internal rotation. In a position of sleep with hip adducted across the body the hip was stable to 60 degrees of rotation.  We then turned back to the femur and tested the broach for stability and fit, and removed the trial broach. The final femoral implant was placed, and the trial head was again trialed for stability. We then irrigated and dried the trunion, and the final head was placed. We placed an 46 mm bipolar head. The hip was again reduced, and taken through a range of motion and found to be stable as above. The hip was thoroughly irrigated, and a posterior capsular repair was performed with #2 Ethibond. The wound was irrigated with normal saline. The deep fascia was closed with 1 PDS,  And #1 locking running suture, and 2.0 Monocryl for the subcutaneous tissue. The skin was closed with staples. A sterile dressing was applied. The patient was awakened and transported to the recovery room in  stable condition. All sponge, needle, and instrument counts were correct at the end of the case.  Position: lateral decubitus   Complications: none.  Time Out: performed   Drains/Packing: none  Estimated blood loss: 150 mm  Returned to Recovery Room: in good condition.   Antibiotics: yes    Mechanical VTE (DVT) Prophylaxis: sequential compression devices, TED thigh-high  Chemical VTE (DVT) Prophylaxis: lovenox x 3 weeks  Fluid Replacement  Crystalloid: see anesthesia record Blood: none  FFP: none   Specimens Removed: 1 to pathology   Sponge and Instrument Count Correct? yes   PACU: portable radiograph - low AP pelvis  Admission: inpatient status, start PT & OT POD#1  Plan/RTC: Return in 2 weeks for wound check.  Weight Bearing/Load Lower Extremity: full  Posterior hip precautions   Geralynn Rile, [place] 4:16 PM

## 2015-12-21 NOTE — H&P (Signed)
H&P update  The surgical history has been reviewed and remains accurate without interval change.  The patient was re-examined and patient's physiologic condition has not changed significantly in the last 30 days. The condition still exists that makes this procedure necessary. The treatment plan remains the same, without new options for care.  No new pharmacological allergies or types of therapy has been initiated that would change the plan or the appropriateness of the plan.  The patient and/or family understand the potential benefits and risks.  Jason P. Stann Mainland, MD 12/21/2015 1:19 PM

## 2015-12-21 NOTE — Anesthesia Postprocedure Evaluation (Signed)
Anesthesia Post Note  Patient: Taylor Roth  Procedure(s) Performed: Procedure(s) (LRB): ARTHROPLASTY BIPOLAR HIP (HEMIARTHROPLASTY) (Right)  Patient location during evaluation: PACU Anesthesia Type: Spinal and MAC Level of consciousness: awake and alert Pain management: pain level controlled Vital Signs Assessment: post-procedure vital signs reviewed and stable Respiratory status: spontaneous breathing and respiratory function stable Cardiovascular status: blood pressure returned to baseline and stable Postop Assessment: spinal receding Anesthetic complications: no    Last Vitals:  Vitals:   12/21/15 1820 12/21/15 1850  BP: 137/82 (!) 144/78  Pulse: 86 90  Resp: 20 16  Temp:      Last Pain:  Vitals:   12/21/15 1201  TempSrc: Oral  PainSc:                  Khambrel Amsden,W. EDMOND

## 2015-12-21 NOTE — Transfer of Care (Signed)
Immediate Anesthesia Transfer of Care Note  Patient: Taylor Roth  Procedure(s) Performed: Procedure(s): ARTHROPLASTY BIPOLAR HIP (HEMIARTHROPLASTY) (Right)  Patient Location: PACU  Anesthesia Type:Spinal  Level of Consciousness: awake, alert , pateint uncooperative and confused  Airway & Oxygen Therapy: Patient Spontanous Breathing and Patient connected to nasal cannula oxygen  Post-op Assessment: Report given to RN, Post -op Vital signs reviewed and stable and Patient moving all extremities  Post vital signs: Reviewed and stable  Last Vitals:  Vitals:   12/21/15 0622 12/21/15 1201  BP: (!) 158/95 (!) 145/80  Pulse: 94 81  Resp: 17 16  Temp: 37.1 C 37.5 C    Last Pain:  Vitals:   12/21/15 1201  TempSrc: Oral  PainSc:       Patients Stated Pain Goal: 3 (22/56/72 0919)  Complications: No apparent anesthesia complications

## 2015-12-21 NOTE — Progress Notes (Signed)
Called gave report, transport with O2 at 2 liters

## 2015-12-22 ENCOUNTER — Inpatient Hospital Stay (HOSPITAL_COMMUNITY): Payer: Medicare Other

## 2015-12-22 ENCOUNTER — Encounter (HOSPITAL_COMMUNITY): Payer: Self-pay | Admitting: Orthopedic Surgery

## 2015-12-22 LAB — URINALYSIS, ROUTINE W REFLEX MICROSCOPIC
Glucose, UA: NEGATIVE mg/dL
Hgb urine dipstick: NEGATIVE
KETONES UR: 15 mg/dL — AB
LEUKOCYTES UA: NEGATIVE
NITRITE: NEGATIVE
PROTEIN: NEGATIVE mg/dL
Specific Gravity, Urine: 1.036 — ABNORMAL HIGH (ref 1.005–1.030)
pH: 6 (ref 5.0–8.0)

## 2015-12-22 LAB — BASIC METABOLIC PANEL
ANION GAP: 11 (ref 5–15)
BUN: 19 mg/dL (ref 6–20)
BUN: 19 mg/dL (ref 4–21)
CHLORIDE: 104 mmol/L (ref 101–111)
CO2: 25 mmol/L (ref 22–32)
CREATININE: 0.7 mg/dL (ref 0.5–1.1)
CREATININE: 0.71 mg/dL (ref 0.44–1.00)
Calcium: 8.6 mg/dL — ABNORMAL LOW (ref 8.9–10.3)
GFR calc non Af Amer: >60 mL/min (ref >60)
GLUCOSE: 126 mg/dL — AB (ref 65–99)
Glucose: 126 mg/dL
Potassium: 3.4 mmol/L (ref 3.4–5.3)
Potassium: 3.4 mmol/L — ABNORMAL LOW (ref 3.5–5.1)
Sodium: 140 mmol/L (ref 135–145)
Sodium: 140 mmol/L (ref 137–147)

## 2015-12-22 LAB — LACTIC ACID, PLASMA
LACTIC ACID, VENOUS: 2.4 mmol/L — AB (ref 0.5–1.9)
Lactic Acid, Venous: 1.4 mmol/L (ref 0.5–1.9)

## 2015-12-22 LAB — CBC AND DIFFERENTIAL
HCT: 33 % — AB (ref 36–46)
Hemoglobin: 10.2 g/dL — AB (ref 12.0–16.0)
PLATELETS: 254 10*3/uL (ref 150–399)
WBC: 11.7 10*3/mL

## 2015-12-22 LAB — CBC
HCT: 33.1 % — ABNORMAL LOW (ref 36.0–46.0)
Hemoglobin: 10.2 g/dL — ABNORMAL LOW (ref 12.0–15.0)
MCH: 27.9 pg (ref 26.0–34.0)
MCHC: 30.8 g/dL (ref 30.0–36.0)
MCV: 90.7 fL (ref 78.0–100.0)
Platelets: 254 10*3/uL (ref 150–400)
RBC: 3.65 MIL/uL — ABNORMAL LOW (ref 3.87–5.11)
RDW: 13.3 % (ref 11.5–15.5)
WBC: 11.7 10*3/uL — ABNORMAL HIGH (ref 4.0–10.5)

## 2015-12-22 LAB — PROCALCITONIN: Procalcitonin: 0.16 ng/mL

## 2015-12-22 MED ORDER — ACETAMINOPHEN 325 MG PO TABS
325.0000 mg | ORAL_TABLET | Freq: Once | ORAL | Status: AC
  Administered 2015-12-23: 325 mg via ORAL
  Filled 2015-12-22: qty 1

## 2015-12-22 MED ORDER — POTASSIUM CHLORIDE IN NACL 40-0.9 MEQ/L-% IV SOLN
INTRAVENOUS | Status: DC
  Administered 2015-12-22 – 2015-12-25 (×2): 50 mL/h via INTRAVENOUS
  Administered 2015-12-27: 10 mL/h via INTRAVENOUS
  Filled 2015-12-22 (×6): qty 1000

## 2015-12-22 MED ORDER — SODIUM CHLORIDE 0.9 % IV BOLUS (SEPSIS)
500.0000 mL | Freq: Once | INTRAVENOUS | Status: AC
  Administered 2015-12-23: 500 mL via INTRAVENOUS

## 2015-12-22 NOTE — Evaluation (Signed)
Occupational Therapy Evaluation Patient Details Name: Taylor Roth MRN: [id number] DOB: 06-Aug-1928 Today's Date: 12/22/2015    History of Present Illness Pt is a 82 y.o. female with medical history significant of benzo/narcotics abuse, anxiety, depression, CKD, COPD, GERD, migraines, osteoporosis, scoliosis, substance abuse, vertigo admitted after fall.  Pt s/p R THA with posterior precautions   Clinical Impression   Pt admitted after fall while at SNF, and presents with the deficits listed below. Pt very limited today during evaluation due to lethargy suspect of medication. Pt very lethargic during evaluation and required MAX to TOT A of 2 people.  Pt will benefit from skilled OT in the acute care setting to maximize independence in ADL.  Pt's daughter present for entire session and educated on hip precautions and reviewed illustrated handout.  Recommend SNF for rehab.    Follow Up Recommendations  SNF;Supervision/Assistance - 24 hour    Equipment Recommendations  None recommended by OT (TBD by next venue of care)    Recommendations for Other Services       Precautions / Restrictions Precautions Precautions: Posterior Hip;Fall Precaution Booklet Issued: Yes (comment) Precaution Comments: reviewed with daughter Required Braces or Orthoses: Other Brace/Splint Other Brace/Splint: Abduction wedge Restrictions Weight Bearing Restrictions: Yes RLE Weight Bearing: Weight bearing as tolerated      Mobility Bed Mobility Overal bed mobility: Needs Assistance;+2 for physical assistance Bed Mobility: Supine to Sit     Supine to sit: Max assist;+2 for physical assistance     General bed mobility comments: MAX A of 2 with use of bed pads to come to EOB  Transfers Overall transfer level: Needs assistance Equipment used:  (gait belt and bed pads) Transfers: Stand Pivot Transfers   Stand pivot transfers: Total assist;+2 physical assistance       General transfer comment:  +2 with use of bed pad underneath pt and transferred to the R.  Pt with no grimacing or vocalizations regarding any pain.  Daughter reports that is unusual for pt.    Balance Overall balance assessment: History of Falls   Sitting balance-Leahy Scale: Zero       Standing balance-Leahy Scale: Zero                              ADL Overall ADL's : Needs assistance/impaired                                       General ADL Comments: Pt would not arouse enough to participate in ADL, Pt transferred from bed to chair +2 total assist     Vision     Perception     Praxis      Pertinent Vitals/Pain Pain Assessment: Faces Faces Pain Scale: Hurts little more Pain Descriptors / Indicators: Moaning Pain Intervention(s): Repositioned;Limited activity within patient's tolerance;Ice applied;Other (comment) (Pt did not communicate pain, cried out once during session)     Hand Dominance     Extremity/Trunk Assessment Upper Extremity Assessment Upper Extremity Assessment: Difficult to assess due to impaired cognition   Lower Extremity Assessment Lower Extremity Assessment: Difficult to assess due to impaired cognition       Communication Communication Communication: Other (comment) (Legally Blind, per Daughter hears fine)   Cognition Arousal/Alertness: Lethargic;Suspect due to medications Behavior During Therapy: Flat affect Overall Cognitive Status: Difficult to assess  General Comments       Exercises       Shoulder Instructions      Home Living Family/patient expects to be discharged to:: [place]: [facility] Knudsen - single point;[place] - 4 wheels          Prior Functioning/Environment Level of Independence: Needs assistance  Gait / Transfers Assistance Needed: Pt was recently d/c from hospital to SNF for rehab.  Had been evaluated by therapy at SNF,  but daughter states nothing beyond that. ADL's / Homemaking Assistance Needed: Pt was recently d/c from hospital to SNF for rehab.  Had been evaluated by therapy at SNF, but daughter states nothing beyond that.   Comments: pt uses rollator for ambulation, per daughter pt was ambulating around her apt Oct 10 however has since declined with mobility        OT Problem List: Decreased strength;Decreased range of motion;Decreased activity tolerance;Impaired balance (sitting and/or standing);Decreased coordination;Decreased cognition;Decreased safety awareness;Decreased knowledge of use of DME or AE;Decreased knowledge of precautions;Pain   OT Treatment/Interventions: Self-care/ADL training;Therapeutic exercise;DME and/or AE instruction;Therapeutic activities;Patient/family education;Balance training    OT Goals(Current goals can be found in the care plan section) Acute Rehab OT Goals Patient Stated Goal: for her mother to wake up more (from daughter) OT Goal Formulation: With family Time For Goal Achievement: 01/05/16 Potential to Achieve Goals: Good ADL Goals Pt Will Perform Lower Body Bathing: with supervision;with adaptive equipment;sit to/from stand Pt Will Perform Lower Body Dressing: with min guard assist;with adaptive equipment;with caregiver independent in assisting;sit to/from stand Pt Will Transfer to Toilet: with supervision;ambulating;bedside commode (RW) Pt Will Perform Toileting - Clothing Manipulation and hygiene: with modified independence;sit to/from stand Additional ADL Goal #1: Pt will recall 3/3 posterior hip precautions  OT Frequency: Min 2X/week   Barriers to D/C:    came from [facility]       Co-evaluation PT/OT/SLP Co-Evaluation/Treatment: Yes Reason for Co-Treatment: Necessary to address cognition/behavior during functional activity;For patient/therapist safety PT goals addressed during session: Mobility/safety with mobility OT goals addressed during session:  ADL's and self-care      End of Session Equipment Utilized During Treatment: Gait belt;Oxygen Nurse Communication: Mobility status;Other (comment) (no chair alarm due to family presence)  Activity Tolerance: Patient limited by lethargy Patient left: in chair;with call bell/phone within reach;with family/visitor present;Other (comment) (no chair alarm due to famliy presence, RN aware)   Time: 2549-8264 OT Time Calculation (min): 34 min Charges:  OT General Charges $OT Visit: 1 Procedure OT Evaluation $OT Eval Moderate Complexity: 1 Procedure G-Codes:    Merri Ray Orvilla Truett 2016/01/15, 12:26 PM  Hulda Humphrey OTR/L [phone]

## 2015-12-22 NOTE — Progress Notes (Signed)
   Subjective:  Patient reports pain as mild.  Sleepy this am.  Objective:   VITALS:   Vitals:   12/21/15 2015 12/22/15 0100 12/22/15 0255 12/22/15 0523  BP: (!) 150/99 (!) 156/81  (!) 123/58  Pulse: 96 100  79  Resp: 17 17  16   Temp: 98.7 F (37.1 C) (!) 102.2 F (39 C) 100.2 F (37.9 C) 97.4 F (36.3 C)  TempSrc: Oral Oral Oral Oral  SpO2: 97% 99%  96%  Weight:      Height:        Neurologically intact responds to stimulation in the right foot Not following commands Dressing is c/d/i, rolled up some distally, will change before dc home  Lab Results  Component Value Date   WBC 11.7 (H) 12/22/2015   HGB 10.2 (L) 12/22/2015   HCT 33.1 (L) 12/22/2015   MCV 90.7 12/22/2015   PLT 254 12/22/2015   BMET    Component Value Date/Time   NA 140 12/22/2015 0528   NA 142 12/15/2015   K 3.4 (L) 12/22/2015 0528   CL 104 12/22/2015 0528   CO2 25 12/22/2015 0528   GLUCOSE 126 (H) 12/22/2015 0528   BUN 19 12/22/2015 0528   BUN 15 12/15/2015   CREATININE 0.71 12/22/2015 0528   CREATININE 0.85 11/09/2013 1135   CALCIUM 8.6 (L) 12/22/2015 0528   GFRNONAA >60 12/22/2015 0528   GFRNONAA 64 11/09/2013 1135   GFRAA >60 12/22/2015 0528   GFRAA 74 11/09/2013 1135     Assessment/Plan: 1 Day Post-Op   Principal Problem:   Closed right hip fracture, initial encounter ([place]) Active Problems:   COPD (chronic obstructive pulmonary disease) (HCC)   Osteoporosis   Glaucoma   Protein-calorie malnutrition, severe ([place])   CKD (chronic kidney disease), stage III   Encephalopathy   Decubitus ulcer of hip, stage 3 ([place])    Up with therapy PT/OT WBAT RLE Posterior hip precautions Will follow   [name] 12/22/2015, 8:32 AM   Geralynn Rile, MD [phone]

## 2015-12-22 NOTE — Progress Notes (Signed)
Speech Language Pathology Treatment: Dysphagia  Patient Details Name: Taylor Roth MRN: [id number] DOB: [birth year] Today's Date: 12/22/2015 Time: 4888-9169 SLP Time Calculation (min) (ACUTE ONLY): 19 min  Assessment / Plan / Recommendation Clinical Impression  Pt demonstrates lethargy impacting safety with PO in setting of mild baseline esophageal dysphagia. Pt with dry oral mucosa, sleeping with mouth open; SLP completed oral care to moisten mucosa for PO intake and discussed with with RN and family. Repositioned pt and provided support to the pts head for neutral positioning. With tactile cues pt accepted meds crushed in puree from RN and sips of thin liquids without signs of aspiration. Brief oral hold observed and audible swallow. Discussed precautions with family. Pt to continue current diet (regualr) though puree textures are more realistic. Pts daughter plans to be present for meals and will select and mash foods appropriately. SLP will f/u for tolerance.    HPI HPI: Taylor Roth is a 82 y.o. female with medical history significant of benzo/narcotics abuse, anxiety, depression, CKD, COPD, GERD, migraines, osteoporosis, scoliosis, substance abuse, vertigo admitted after fall.  Found to have a hip fx and plans are for surgery.  Pt has been admitted recently with pna per her daughter and has baseline esophageal dysphagia with prior food impaction per chart review.  Esophagram 01/18/2015 done showed poor primary esophageal stripping wave and tertiary contractions and a prominent kink of mid and distal thoracic esophagus resulting in lodged barium tablet that did not clear despite warm water ingestion over 10 minutes.  Per family within the last weeks, pt has needed someone to feed her at her facility.  Pt is npo currently and plan is for surgery the next date.       SLP Plan  Continue with current plan of care     Recommendations  Diet recommendations: Thin liquid;Dysphagia 1  (puree) Liquids provided via: Straw Medication Administration: Crushed with puree Supervision: Full supervision/cueing for compensatory strategies Compensations: Minimize environmental distractions;Slow rate;Small sips/bites;Follow solids with liquid Postural Changes and/or Swallow Maneuvers: Seated upright 90 degrees                Plan: Continue with current plan of care       GO               [facility], [place] CCC-SLP [phone]  Lynann Beaver 12/22/2015, 10:51 AM

## 2015-12-22 NOTE — Evaluation (Signed)
Physical Therapy Evaluation Patient Details Name: Taylor Roth MRN: [id number] DOB: Jul 06, 1928 Today's Date: 12/22/2015   History of Present Illness  Taylor Roth is a 82 y.o. female with medical history significant of benzo/narcotics abuse, anxiety, depression, CKD, COPD, GERD, migraines, osteoporosis, scoliosis, substance abuse, vertigo admitted after fall.  Pt s/p R THA with posterior precautions  Clinical Impression  Pt admitted with above diagnosis. Pt currently with functional limitations due to the deficits listed below (see PT Problem List). Pt will benefit from skilled PT to increase their independence and safety with mobility to allow discharge to the venue listed below.  Pt very lethargic during evaluation and required MAX to TOT A of 2 people.  Pt's daughter present and educated on hip precautions and reviewed illustrated handout.  Recommend SNF for rehab.     Follow Up Recommendations SNF;Supervision/Assistance - 24 hour    Equipment Recommendations  None recommended by PT    Recommendations for Other Services       Precautions / Restrictions Precautions Precautions: Posterior Hip;Fall Precaution Booklet Issued: Yes (comment) Precaution Comments: reviewed with daughter Restrictions Weight Bearing Restrictions: Yes RLE Weight Bearing: Weight bearing as tolerated      Mobility  Bed Mobility Overal bed mobility: Needs Assistance;+2 for physical assistance Bed Mobility: Supine to Sit     Supine to sit: Max assist;+2 for physical assistance     General bed mobility comments: MAX A of 2 with use of bed pads to come to EOB  Transfers Overall transfer level: Needs assistance   Transfers: Stand Pivot Transfers   Stand pivot transfers: Total assist;+2 physical assistance       General transfer comment: +2 with use of bed pad underneath pt and transferred to the R.  Pt with no grimacing or vocalizations regarding any pain.  Daughter reports that is unusual  for pt.  Ambulation/Gait             General Gait Details: unable  Stairs            Wheelchair Mobility    Modified Rankin (Stroke Patients Only)       Balance Overall balance assessment: History of Falls   Sitting balance-Leahy Scale: Zero       Standing balance-Leahy Scale: Zero                               Pertinent Vitals/Pain Pain Assessment: Faces Faces Pain Scale: Hurts little more Pain Descriptors / Indicators: Grimacing Pain Intervention(s): Ice applied;Repositioned;Premedicated before session    Home Living Family/patient expects to be discharged to:: Skilled nursing facility                      Prior Function Level of Independence: Needs assistance   Gait / Transfers Assistance Needed: Pt was recently d/c from hospital to SNF for rehab.  Had been evaluated by PT at SNF, but daughter states nothing beyond that.     Comments: pt uses rollator for ambulation, per daughter pt was ambulating around her apt Oct 10 however has since declined with mobility     Hand Dominance        Extremity/Trunk Assessment   Upper Extremity Assessment: Defer to OT evaluation           Lower Extremity Assessment: Generalized weakness         Communication   Communication: Other (comment) (legally blind)  Cognition Arousal/Alertness: Lethargic;Suspect  due to medications   Overall Cognitive Status: Difficult to assess                      General Comments General comments (skin integrity, edema, etc.): per chart, stage III pressure injury L hip    Exercises     Assessment/Plan    PT Assessment Patient needs continued PT services  PT Problem List Decreased strength;Decreased range of motion;Decreased activity tolerance;Decreased balance;Decreased mobility;Decreased cognition;Decreased knowledge of use of DME;Decreased knowledge of precautions;Decreased safety awareness          PT Treatment Interventions DME  instruction;Gait training;Functional mobility training;Therapeutic activities;Therapeutic exercise;Patient/family education    PT Goals (Current goals can be found in the Care Plan section)  Acute Rehab PT Goals Patient Stated Goal: for her mother to wake up more PT Goal Formulation: With family Time For Goal Achievement: 12/29/15 Potential to Achieve Goals: Fair    Frequency Min 3X/week   Barriers to discharge        Co-evaluation PT/OT/SLP Co-Evaluation/Treatment: Yes Reason for Co-Treatment: For patient/therapist safety           End of Session Equipment Utilized During Treatment: Gait belt Activity Tolerance: Patient limited by lethargy Patient left: in chair;with call bell/phone within reach;with family/visitor present Nurse Communication: Mobility status (nurse tech)         Time: 3291-9166 PT Time Calculation (min) (ACUTE ONLY): 34 min   Charges:   PT Evaluation $PT Eval Moderate Complexity: 1 Procedure     PT G Codes:        Atwood Adcock LUBECK 12/22/2015, 12:03 PM

## 2015-12-22 NOTE — Progress Notes (Signed)
CRITICAL VALUE ALERT  Critical value received:  Lactic Acid: 2.4  Date of notification:  12/22/15  Time of notification:  4628  Critical value read back:Yes.    Nurse who received alert:  Egbert Garibaldi  MD notified (1st page):  Dr. Doyle Askew  Time of first page:  1525

## 2015-12-22 NOTE — Progress Notes (Signed)
Spoke to Dr. Doyle Askew about leaving pt's foley in and received verbal confirmation to leave in place. Will continue to monitor

## 2015-12-22 NOTE — Progress Notes (Signed)
PROGRESS NOTE                                                                                                                                                      Patient Demographics:    Taylor Roth, is a 82 y.o. female, DOB - 1928-12-04, ZOX:[id number]  Admit date - 12/19/2015   Admitting Physician MAGICK-Leyan Branden  LOS - 3  Brief Narrative   82 year old female with bipolar disorder, mild dementia, hypertension, benzos and narcotics use, CKD stage IV, COPD, GERD, depression who was recently hospitalized for pneumonia and COPD exacerbation and discharged to skilled nursing facility. Patient was participating with physical therapy at the facility but reportedly had an unwitnessed fall 2 days prior to admission. Since then she was not participating with PT and remained in bed. When daughter went to visit her on the day of admission she was found to be very confused and lethargic.   Subjective:   Patient is still rather sleepy this AM, able to open eyes but quickly doses off.    Assessment  & Plan :   Principal Problem: Closed right hip fracture, initial encounter ([place]) - Suspect secondary to mechanical fall. - s/p open treatment of femoral fracture, proximal end, neck, prosthetic replacement, postop day #1 - ortho team following, appreciate assistance  - pt still sleepy this AM, hold off on narcotics or muscle relaxants until pt more alert  - PT eval requested   Active Problems: Acute encephalopathy - recent pneumonia, hospitalization and use of narcotics. - avoid narcotics for now until pt more alert   Post op fever up to 102.2 F - with WBC just slightly up to 11.7 - pt unable to verbalize symptoms - I suspect this to be reactive post op process  - will ask for UA, urine culture, lactic acid, procalcitonin - repeat CXR as well to ensure no developing worsening PNA  Hypernatremia, hypokalemia  - mild, suspect  from pre renal etiology - resolved with IVF but oral intake is minimal  - supplement K via IV as pt not eating much yet - BMP in AM  COPD (chronic obstructive pulmonary disease) (HCC) - maintaining oxygen saturations at target range  Protein-calorie malnutrition, severe ([place]) - Nutritionist consulted.  CKD (chronic kidney disease), stage III - Renal function stable. Continue to monitor.  Decubitus ulcer of hip, stage 3 (HCC) - Seen by wound care  - Recommend mattress replacement for pressure redistribution, prevalon boots for heel pressure injury protection.  Depression/bipolar disorder - continue home medications  Code Status : DO NOT RESUSCITATE  Family Communication  : Husband at bedside   Disposition Plan  :  Needs SNF  Consults  : Dr. Lorre Nick (orthopedics)  Procedures  : Hip surgery   DVT Prophylaxis  : Lovenox SQ  Lab Results  Component Value Date   PLT 254 12/22/2015    Antibiotics  :  Pre op  Anti-infectives    Start     Dose/Rate Route Frequency Ordered Stop   12/21/15 2015  ceFAZolin (ANCEF) IVPB 1 g/50 mL premix     1 g 100 mL/hr over 30 Minutes Intravenous Every 6 hours 12/21/15 2003 12/22/15 1559   12/21/15 1330  ceFAZolin (ANCEF) IVPB 2g/100 mL premix     2 g 200 mL/hr over 30 Minutes Intravenous To [facility] 12/20/15 2217 12/21/15 1414        Objective:   Vitals:   12/21/15 2015 12/22/15 0100 12/22/15 0255 12/22/15 0523  BP: (!) 150/99 (!) 156/81  (!) 123/58  Pulse: 96 100  79  Resp: 17 17  16   Temp: 98.7 F (37.1 C) (!) 102.2 F (39 C) 100.2 F (37.9 C) 97.4 F (36.3 C)  TempSrc: Oral Oral Oral Oral  SpO2: 97% 99%  96%  Weight:      Height:        Wt Readings from Last 3 Encounters:  12/19/15 45.2 kg (99 lb 10.4 oz)  12/19/15 45.4 kg (100 lb)  12/16/15 46.3 kg (102 lb 1.2 oz)     Intake/Output Summary (Last 24 hours) at 12/22/15 1036 Last data filed at 12/22/15 0600  Gross per 24 hour  Intake           829.17 ml    Output              425 ml  Net           404.17 ml   Physical Exam Gen: lethargic, able to open eyes with sternal rub and, doses off very quickly  HEENT: no pallor, dry MM, supple neck, temporal wasting Chest: diminished breath sounds at bases  CVS: N S1&S2, no murmurs, rubs or gallop GI: soft, NT, ND, BS+, Foley placed on admission   Data Review:   CBC  Recent Labs Lab 12/19/15 1717 12/20/15 0418 12/21/15 2054 12/22/15 0528  WBC 7.8 7.1 10.4 11.7*  HGB 10.9* 10.5* 10.8* 10.2*  HCT 35.2* 34.1* 35.1* 33.1*  PLT 285 272 287 254  MCV 93.9 93.9 90.9 90.7  MCH 29.1 28.9 28.0 27.9  MCHC 31.0 30.8 30.8 30.8  RDW 13.9 13.7 13.3 13.3  LYMPHSABS 1.4  --   --   --   MONOABS 1.0  --   --   --   EOSABS 0.1  --   --   --   BASOSABS 0.0  --   --   --     Chemistries   Recent Labs Lab 12/19/15 1717 12/20/15 0418 12/21/15 2054 12/22/15 0528  NA 144 146*  --  140  K 3.9 3.8  --  3.4*  CL 108 109  --  104  CO2 29 28  --  25  GLUCOSE 109* 103*  --  126*  BUN 29* 28*  --  19  CREATININE 0.73 0.63 0.64 0.71  CALCIUM 8.9 9.0  --  8.6*  AST 61*  --   --   --   ALT 46  --   --   --   ALKPHOS 52  --   --   --   BILITOT 0.6  --   --   --  Coagulation profile  Recent Labs Lab 12/19/15 1717  INR 1.16   Inpatient Medications  Scheduled Meds: . acetaminophen  1,000 mg Oral Q6H  .  ceFAZolin (ANCEF) IV  1 g Intravenous Q6H  . collagenase   Topical Daily  . divalproex  500 mg Oral Q breakfast  . docusate sodium  100 mg Oral BID  . dorzolamide-timolol  1 drop Both Eyes BID  . enoxaparin (LOVENOX) injection  30 mg Subcutaneous Q24H  . famotidine  10 mg Oral BID  . feeding supplement (ENSURE ENLIVE)  237 mL Oral BID WC  . ferrous sulfate  325 mg Oral TID PC  . fludrocortisone  0.1 mg Oral q morning - 10a  . latanoprost  1 drop Both Eyes QHS  . metoprolol tartrate  25 mg Oral BID  . pantoprazole  40 mg Oral Q0600  . polyethylene glycol  17 g Oral Daily  . potassium  chloride SA  40 mEq Oral Daily  . QUEtiapine Fumarate  150 mg Oral QHS   Continuous Infusions: . sodium chloride 50 mL/hr (12/22/15 0125)  . lactated ringers 10 mL/hr at 12/21/15 1400   PRN Meds:.acetaminophen **OR** acetaminophen, acetaminophen, albuterol, HYDROcodone-acetaminophen, metoCLOPramide **OR** metoCLOPramide (REGLAN) injection, morphine injection, ondansetron **OR** ondansetron (ZOFRAN) IV, oxyCODONE, polyethylene glycol, senna  Micro Results Recent Results (from the past 240 hour(s))  Urine culture     Status: None   Collection Time: 12/19/15 11:11 PM  Result Value Ref Range Status   Specimen Description URINE, RANDOM  Final   Special Requests NONE  Final   Culture NO GROWTH Performed at [facility]   Final   Report Status 12/21/2015 FINAL  Final  Surgical pcr screen     Status: None   Collection Time: 12/20/15 11:32 PM  Result Value Ref Range Status   MRSA, PCR NEGATIVE NEGATIVE Final   Staphylococcus aureus NEGATIVE NEGATIVE Final    Comment:        The Xpert SA Assay (FDA approved for NASAL specimens in patients over 72 years of age), is one component of a comprehensive surveillance program.  Test performance has been validated by [facility] for patients greater than or equal to 13 year old. It is not intended to diagnose infection nor to guide or monitor treatment.     Radiology Reports Dg Chest 1 View  Result Date: 12/19/2015 CLINICAL DATA:  Hip fracture, fall EXAM: CHEST 1 VIEW COMPARISON:  12/15/2015 FINDINGS: Semi-erect view of the chest demonstrates mild hazy bibasilar opacity, probable atelectasis. No large effusion. Cardiomediastinal silhouette stable with atherosclerosis. No pneumothorax. Skin fold artifact over the left chest. IMPRESSION: Hazy bibasilar atelectasis. Atherosclerosis of the aorta Electronically Signed   By: Donavan Foil M.D.   On: 12/19/2015 18:11   Ct Head Wo Contrast  Result Date: 12/11/2015 CLINICAL DATA:  Altered  mental status. EXAM: CT HEAD WITHOUT CONTRAST TECHNIQUE: Contiguous axial images were obtained from the base of the skull through the vertex without intravenous contrast. COMPARISON:  CT scan of November 23, 2015. FINDINGS: Brain: Mild chronic ischemic white matter disease is noted. No mass effect or midline shift is noted. Ventricular size is within normal limits. There is no evidence of mass lesion, hemorrhage or acute infarction. Vascular: Atherosclerosis of carotid siphons is noted. Skull: Bony calvarium appears intact. Sinuses/Orbits: Paranasal sinuses appear normal. Other: None. IMPRESSION: Mild chronic ischemic white matter disease. No acute intracranial abnormality seen. Electronically Signed   By: Marijo Conception, M.D.   On: 12/11/2015  20:09   Ct Head Wo Contrast  Result Date: 11/23/2015 CLINICAL DATA:  Fall. Initial encounter. EXAM: CT HEAD WITHOUT CONTRAST CT CERVICAL SPINE WITHOUT CONTRAST TECHNIQUE: Multidetector CT imaging of the head and cervical spine was performed following the standard protocol without intravenous contrast. Multiplanar CT image reconstructions of the cervical spine were also generated. COMPARISON:  01/26/2015 FINDINGS: CT HEAD FINDINGS Brain: No evidence of acute infarction, hemorrhage, hydrocephalus, extra-axial collection or mass lesion/mass effect. Generalized atrophy. Mild for age chronic microvascular ischemic gliosis in the cerebral white matter. Small remote right cerebellar infarct Vascular: Atherosclerotic calcification. Skull: Scalp lacerations or indentations without underlying fracture or opaque foreign body. Sinuses/Orbits: Bilateral cataract resection.  No acute finding. CT CERVICAL SPINE FINDINGS Alignment: Chronic C4-5 anterolisthesis, measuring 3 mm. There is new kyphotic deformity at C5-6 as described below. Skull base and vertebrae: C3-4 and C5-6 anterior cervical discectomy with solid bony fusion. C6-7 non segmentation. Interval fracture through the fused  C5-6 vertebrae with kyphotic deformity. Fracture is still seen across the posterior elements at the level of the facets and lamina. The fracture appears chronic, with healed appearance to the vertebral body fracture. No acute fracture is detected. Soft tissues and spinal canal: No prevertebral fluid or swelling. No visible canal hematoma. Disc levels: Diffuse disc and facet degeneration. Bulky transverse ligamentous thickening without foramen magnum stenosis. Upper chest: Pleural based scarring seen at the left apex. IMPRESSION: 1. No evidence of acute intracranial or cervical spine injury. 2. Interval but remote fracture across the C5-6 fused vertebral bodies and posterior elements with healed kyphotic deformity. Incomplete posterior element fracture lines are still visible. 3. Previous C3-4 and C5-6 ACDF. C6-7 non segmentation. Severe adjacent segment facet degeneration at C4-5 with chronic 3 mm anterolisthesis. Electronically Signed   By: Monte Fantasia M.D.   On: 11/23/2015 17:29   Ct Cervical Spine Wo Contrast  Result Date: 11/23/2015 CLINICAL DATA:  Fall. Initial encounter. EXAM: CT HEAD WITHOUT CONTRAST CT CERVICAL SPINE WITHOUT CONTRAST TECHNIQUE: Multidetector CT imaging of the head and cervical spine was performed following the standard protocol without intravenous contrast. Multiplanar CT image reconstructions of the cervical spine were also generated. COMPARISON:  01/26/2015 FINDINGS: CT HEAD FINDINGS Brain: No evidence of acute infarction, hemorrhage, hydrocephalus, extra-axial collection or mass lesion/mass effect. Generalized atrophy. Mild for age chronic microvascular ischemic gliosis in the cerebral white matter. Small remote right cerebellar infarct Vascular: Atherosclerotic calcification. Skull: Scalp lacerations or indentations without underlying fracture or opaque foreign body. Sinuses/Orbits: Bilateral cataract resection.  No acute finding. CT CERVICAL SPINE FINDINGS Alignment: Chronic  C4-5 anterolisthesis, measuring 3 mm. There is new kyphotic deformity at C5-6 as described below. Skull base and vertebrae: C3-4 and C5-6 anterior cervical discectomy with solid bony fusion. C6-7 non segmentation. Interval fracture through the fused C5-6 vertebrae with kyphotic deformity. Fracture is still seen across the posterior elements at the level of the facets and lamina. The fracture appears chronic, with healed appearance to the vertebral body fracture. No acute fracture is detected. Soft tissues and spinal canal: No prevertebral fluid or swelling. No visible canal hematoma. Disc levels: Diffuse disc and facet degeneration. Bulky transverse ligamentous thickening without foramen magnum stenosis. Upper chest: Pleural based scarring seen at the left apex. IMPRESSION: 1. No evidence of acute intracranial or cervical spine injury. 2. Interval but remote fracture across the C5-6 fused vertebral bodies and posterior elements with healed kyphotic deformity. Incomplete posterior element fracture lines are still visible. 3. Previous C3-4 and C5-6 ACDF. C6-7 non segmentation.  Severe adjacent segment facet degeneration at C4-5 with chronic 3 mm anterolisthesis. Electronically Signed   By: Monte Fantasia M.D.   On: 11/23/2015 17:29   Dg Pelvis Portable  Result Date: 12/21/2015 CLINICAL DATA:  Right hip replacement EXAM: PORTABLE PELVIS 1-2 VIEWS COMPARISON:  12/19/2015 FINDINGS: AP portable supine view of the pelvis. Only the lower pelvis is included. The patient is status post right hip replacement with satisfactory alignment. Soft tissue staples laterally. Small amount of soft tissue gas, postoperative. IMPRESSION: A interim right hip replacement with satisfactory alignment. Electronically Signed   By: Donavan Foil M.D.   On: 12/21/2015 20:11   Dg Chest Port 1 View  Result Date: 12/15/2015 CLINICAL DATA:  82 year old female with pneumonia. Subsequent encounter. EXAM: PORTABLE CHEST 1 VIEW COMPARISON:   12/11/2015, 08/12/2015 and 01/26/2015. FINDINGS: Slightly asymmetric airspace disease greater on the right which may represent a mild pulmonary edema. No segmental consolidation. Prominent skin fold on left without pneumothorax. Calcified tortuous aorta. Heart size within normal limits. No plain film evidence of pulmonary malignancy. Shoulder joint degenerative changes and scoliosis thoracic spine. IMPRESSION: Findings suggestive of mild pulmonary edema. Electronically Signed   By: Genia Del M.D.   On: 12/15/2015 13:08   Dg Chest Port 1 View  Result Date: 12/11/2015 CLINICAL DATA:  Back pain. EXAM: PORTABLE CHEST 1 VIEW COMPARISON:  August 12, 2015 FINDINGS: The heart size borderline. The hila and mediastinum are unchanged. No pulmonary nodules or masses. Mild interstitial prominence in the right chest, new in the interval. No other acute abnormalities. IMPRESSION: Mild interstitial prominence in the right lung, asymmetric to the left could represent asymmetric edema or an atypical infection. Recommend clinical correlation and follow-up to resolution. Electronically Signed   By: Dorise Bullion III M.D   On: 12/11/2015 16:16   Dg Hip Unilat With Pelvis 2-3 Views Right  Result Date: 12/19/2015 CLINICAL DATA:  Fall at nursing facility EXAM: DG HIP (WITH OR WITHOUT PELVIS) 2-3V RIGHT COMPARISON:  01/27/2015 FINDINGS: The left hip demonstrates normal positioning. The pubic symphysis is intact. There is a mildly comminuted right femoral neck fracture with mild superior migration of the distal femur. The right femoral head remains seated within the acetabulum. There is possible underlying lucency within the superolateral femoral neck. There is mild varus angulation. IMPRESSION: 1. Mildly comminuted, displaced and angulated right femoral neck fracture. Possible lucency within the underlying superolateral femoral neck, pathologic fracture cannot be excluded. 2. The left hip is within normal limits Electronically  Signed   By: Donavan Foil M.D.   On: 12/19/2015 18:09    Time Spent in minutes  25  Faye Ramsay M.D on 12/22/2015 at 10:36 AM  Between 7am to 7pm - Pager - [phone]  After 7pm go to [email or web address] - password [facility]  [name] -  Office  [phone]

## 2015-12-22 NOTE — Progress Notes (Signed)
Pt with rectal temp of 101.97f, tylenol suppository was given. Temp remain 101.10f rectal post tylenol administration, all other vitals are stable. Provider on call Baltazar Najjar, NP) was notified.

## 2015-12-23 ENCOUNTER — Encounter (HOSPITAL_COMMUNITY): Payer: Self-pay | Admitting: Radiology

## 2015-12-23 ENCOUNTER — Inpatient Hospital Stay (HOSPITAL_COMMUNITY): Payer: Medicare Other

## 2015-12-23 LAB — CBC
HCT: 28.2 % — ABNORMAL LOW (ref 36.0–46.0)
Hemoglobin: 8.8 g/dL — ABNORMAL LOW (ref 12.0–15.0)
MCH: 28.6 pg (ref 26.0–34.0)
MCHC: 31.2 g/dL (ref 30.0–36.0)
MCV: 91.6 fL (ref 78.0–100.0)
PLATELETS: 284 10*3/uL (ref 150–400)
RBC: 3.08 MIL/uL — AB (ref 3.87–5.11)
RDW: 13.6 % (ref 11.5–15.5)
WBC: 13.6 10*3/uL — ABNORMAL HIGH (ref 4.0–10.5)

## 2015-12-23 LAB — BASIC METABOLIC PANEL WITH GFR
Anion gap: 10 (ref 5–15)
BUN: 24 mg/dL — ABNORMAL HIGH (ref 6–20)
CO2: 21 mmol/L — ABNORMAL LOW (ref 22–32)
Calcium: 8.4 mg/dL — ABNORMAL LOW (ref 8.9–10.3)
Chloride: 111 mmol/L (ref 101–111)
Creatinine, Ser: 0.63 mg/dL (ref 0.44–1.00)
GFR calc Af Amer: >60 mL/min (ref >60)
GFR calc non Af Amer: >60 mL/min (ref >60)
Glucose, Bld: 93 mg/dL (ref 65–99)
Potassium: 4 mmol/L (ref 3.5–5.1)
Sodium: 142 mmol/L (ref 135–145)

## 2015-12-23 LAB — LACTIC ACID, PLASMA
LACTIC ACID, VENOUS: 1.2 mmol/L (ref 0.5–1.9)
LACTIC ACID, VENOUS: 2.6 mmol/L — AB (ref 0.5–1.9)

## 2015-12-23 LAB — CBC AND DIFFERENTIAL
HEMATOCRIT: 28 % — AB (ref 36–46)
Hemoglobin: 8.8 g/dL — AB (ref 12.0–16.0)
Platelets: 284 10*3/uL (ref 150–399)
WBC: 13.6 10^3/mL

## 2015-12-23 LAB — PROTIME-INR
INR: 1.75
PROTHROMBIN TIME: 20.6 s — AB (ref 11.4–15.2)

## 2015-12-23 LAB — BASIC METABOLIC PANEL
BUN: 24 mg/dL — AB (ref 4–21)
Creatinine: 0.6 mg/dL (ref 0.5–1.1)
GLUCOSE: 93 mg/dL
Potassium: 4 mmol/L (ref 3.4–5.3)
SODIUM: 142 mmol/L (ref 137–147)

## 2015-12-23 LAB — URINE CULTURE: CULTURE: NO GROWTH

## 2015-12-23 LAB — CORTISOL: CORTISOL PLASMA: 21.7 ug/dL

## 2015-12-23 LAB — APTT: APTT: 38 s — AB (ref 24–36)

## 2015-12-23 MED ORDER — PIPERACILLIN-TAZOBACTAM 3.375 G IVPB 30 MIN
3.3750 g | Freq: Once | INTRAVENOUS | Status: AC
  Administered 2015-12-23: 3.375 g via INTRAVENOUS
  Filled 2015-12-23: qty 50

## 2015-12-23 MED ORDER — VANCOMYCIN HCL IN DEXTROSE 1-5 GM/200ML-% IV SOLN
1000.0000 mg | Freq: Once | INTRAVENOUS | Status: AC
  Administered 2015-12-23: 1000 mg via INTRAVENOUS
  Filled 2015-12-23: qty 200

## 2015-12-23 MED ORDER — VANCOMYCIN HCL IN DEXTROSE 750-5 MG/150ML-% IV SOLN
750.0000 mg | INTRAVENOUS | Status: DC
  Administered 2015-12-24: 750 mg via INTRAVENOUS
  Filled 2015-12-23 (×2): qty 150

## 2015-12-23 MED ORDER — PIPERACILLIN-TAZOBACTAM 3.375 G IVPB
3.3750 g | Freq: Three times a day (TID) | INTRAVENOUS | Status: DC
  Administered 2015-12-23 – 2015-12-25 (×5): 3.375 g via INTRAVENOUS
  Filled 2015-12-23 (×8): qty 50

## 2015-12-23 MED ORDER — IOPAMIDOL (ISOVUE-370) INJECTION 76%
INTRAVENOUS | Status: AC
Start: 2015-12-23 — End: 2015-12-23
  Administered 2015-12-23: 100 mL
  Filled 2015-12-23: qty 100

## 2015-12-23 MED FILL — Fentanyl Citrate Preservative Free (PF) Inj 100 MCG/2ML: INTRAMUSCULAR | Qty: 2 | Status: AC

## 2015-12-23 NOTE — Progress Notes (Signed)
On assessment this am, pt is still lethargic but will arouse to touch and voice usually with one or two word responses. She will fall quickly back to sleep. Pt does not appear to be in any distress, respirations unlabored, oxygen saturations 98-100% on 2L [place]. Dr. Doyle Askew aware of pt's status, will keep foley in.  [place], Jerry Caras

## 2015-12-23 NOTE — Progress Notes (Signed)
Pharmacy Antibiotic Note  Taylor Roth is a 82 y.o. female admitted on 12/19/2015 with confusion and hip fracture.  She is s/p hip fracture repair on 11/8. Pharmacy has been consulted for vancomycin and Zosyn dosing for sepsis/post-op fever. Vancomycin 1000 mg IV and  Zosyn 3.375 g given at 13:41. Recent hospitalization for PNA and COPD exacerbation.  Plan: Vancomycin 750 mg IV every 24h hours.  Goal trough 15-20 mcg/mL. Zosyn 3.375g IV q8h (4 hour infusion).  Monitor renal function, clinical progress Vancomycin trough as clinically indicated  Height: 4\' 11"  (149.9 cm) Weight: 99 lb 10.4 oz (45.2 kg) IBW/kg (Calculated) : 43.2  Temp (24hrs), Avg:99.9 F (37.7 C), Min:98.1 F (36.7 C), Max:101.9 F (38.8 C)   Recent Labs Lab 12/19/15 1717 12/20/15 0418 12/21/15 2054 12/22/15 0528 12/22/15 1048 12/22/15 1427 12/23/15 0310  WBC 7.8 7.1 10.4 11.7*  --   --  13.6*  CREATININE 0.73 0.63 0.64 0.71  --   --  0.63  LATICACIDVEN  --   --   --   --  1.4 2.4*  --     Estimated Creatinine Clearance: 35.1 mL/min (by C-G formula based on SCr of 0.63 mg/dL).    Allergies  Allergen Reactions  . Macrobid [Nitrofurantoin Macrocrystal] Rash  . Azithromycin Other (See Comments)    unknown  . Doxycycline Other (See Comments)    unknown  . Escitalopram Oxalate Other (See Comments)    unknown  . Fioricet [Butalbital-Apap-Caffeine] Other (See Comments)    Drunk.  . Latex Other (See Comments)    unknown  . Levofloxacin Other (See Comments)    unknown  . Nsaids Other (See Comments)    This is not an allergy. The patient was told by Dr. Rockne Menghini to avoid NSAIDs and stop Vicoprofen in to 2014 to avoid nephrotoxicity.   Allergic reaction not listed on MAR.  Marland Kitchen Oxycodone Other (See Comments)    reaction to synthetic codeine  . Restasis [Cyclosporine] Other (See Comments)    unknown  . Sulfa Antibiotics Other (See Comments)    Reaction unknown  . Biaxin [Clarithromycin] Rash    With  burning sensation    Antimicrobials this admission: Vancomycin 11/10 >>  Zosyn 11/10 >>   Dose adjustments this admission:   Microbiology results: 11/10 BCx: collected 11/9 UCx: NEG  11/7 MRSA PCR: NEG 10/29 Wound: Morganella morganii (R-amp, cefazolin, Bactrim; I-Unasyn)   Thank you for allowing pharmacy to be a part of this patient's care.  Renold Genta, PharmD, BCPS Clinical Pharmacist Phone for today - [place] - [phone] 12/23/2015 1:51 PM

## 2015-12-23 NOTE — Care Management Important Message (Signed)
Important Message  Patient Details  Name: Taylor Roth MRN: [id number] Date of Birth: 1928/06/13   Medicare Important Message Given:  Yes    Devaeh Amadi 12/23/2015, 11:45 AM

## 2015-12-23 NOTE — Progress Notes (Signed)
PROGRESS NOTE                                                                                                                                                      Patient Demographics:    Taylor Roth, is a 82 y.o. female, DOB - August 28, 1928, JFH:[id number]  Admit date - 12/19/2015   Admitting Physician MAGICK-Arsema Tusing  LOS - 4  Brief Narrative   82 year old female with bipolar disorder, mild dementia, hypertension, benzos and narcotics use, CKD stage IV, COPD, GERD, depression who was recently hospitalized for pneumonia and COPD exacerbation and discharged to skilled nursing facility. Patient was participating with physical therapy at the facility but reportedly had an unwitnessed fall 2 days prior to admission. Since then she was not participating with PT and remained in bed. When daughter went to visit her on the day of admission she was found to be very confused and lethargic.   Subjective:   Patient is still rather sleepy this AM, able to open eyes but quickly doses off.    Assessment  & Plan :   Principal Problem: Closed right hip fracture, initial encounter ([place]) - Suspect secondary to mechanical fall. - s/p open treatment of femoral fracture, proximal end, neck, prosthetic replacement, postop day #2 - ortho team following, appreciate assistance  - pt still sleepy this AM, hold off on narcotics or muscle relaxants until pt more alert  - PT eval requested but currently not able to d/c as pt still too lethargic   Active Problems: Acute encephalopathy - recent pneumonia, hospitalization and use of narcotics. - avoid narcotics for now until pt more alert   Post op fever up to 102.2 F - with WBC up again this AM 11.7 --> 13.6 and pt still with fevers up to 101 F - with elevated lactic acid, pt does meet criteria for SIRS and even possible sepsis  - I will initiate sepsis protocol at this time as this could be  potential source of persistent pt's lethargy   Hypernatremia, hypokalemia  - mild, suspect from pre renal etiology - resolved with IVF but oral intake is minimal  - supplement K via IV as pt not eating much yet - BMP in AM  COPD (chronic obstructive pulmonary disease) (HCC) - maintaining oxygen saturations at target range  Protein-calorie malnutrition, severe ([place]) - Nutritionist consulted.  CKD (chronic kidney disease), stage III - Renal function stable. Continue to monitor.  Decubitus ulcer of hip, stage 3 (HCC) - Seen by wound care  - Recommend mattress replacement for pressure redistribution, prevalon boots for heel pressure injury protection.  Depression/bipolar disorder - continue home  medications  Code Status : DO NOT RESUSCITATE  Family Communication  : Husband at bedside   Disposition Plan  : Needs SNF  Consults  : Dr. Lorre Nick (orthopedics)  Procedures  : Hip surgery   DVT Prophylaxis  : Lovenox SQ  Lab Results  Component Value Date   PLT 284 12/23/2015    Antibiotics  :  Pre op  Anti-infectives    Start     Dose/Rate Route Frequency Ordered Stop   12/21/15 2015  ceFAZolin (ANCEF) IVPB 1 g/50 mL premix     1 g 100 mL/hr over 30 Minutes Intravenous Every 6 hours 12/21/15 2003 12/22/15 1059   12/21/15 1330  ceFAZolin (ANCEF) IVPB 2g/100 mL premix     2 g 200 mL/hr over 30 Minutes Intravenous To [facility] 12/20/15 2217 12/21/15 1414        Objective:   Vitals:   12/22/15 2230 12/23/15 0253 12/23/15 0634 12/23/15 0703  BP: 127/60 140/67 137/85   Pulse: 75 76 81   Resp: 20     Temp: (!) 101.9 F (38.8 C) (!) 100.6 F (38.1 C) 99.6 F (37.6 C) 99.1 F (37.3 C)  TempSrc: Rectal Rectal Rectal Rectal  SpO2: 91% 95% 95%   Weight:      Height:        Wt Readings from Last 3 Encounters:  12/19/15 45.2 kg (99 lb 10.4 oz)  12/19/15 45.4 kg (100 lb)  12/16/15 46.3 kg (102 lb 1.2 oz)     Intake/Output Summary (Last 24 hours) at 12/23/15  1218 Last data filed at 12/23/15 1051  Gross per 24 hour  Intake           954.17 ml  Output              475 ml  Net           479.17 ml   Physical Exam Gen: lethargic,able to open eyes with sternal rub and, doses off very quickly  HEENT: no pallor, dry MM, supple neck, temporal wasting Chest: diminished breath sounds at bases  CVS: N S1&S2, no murmurs, rubs or gallop GI: soft, NT, ND, BS+, Foley placed on admission   Data Review:   CBC  Recent Labs Lab 12/19/15 1717 12/20/15 0418 12/21/15 2054 12/22/15 0528 12/23/15 0310  WBC 7.8 7.1 10.4 11.7* 13.6*  HGB 10.9* 10.5* 10.8* 10.2* 8.8*  HCT 35.2* 34.1* 35.1* 33.1* 28.2*  PLT 285 272 287 254 284  MCV 93.9 93.9 90.9 90.7 91.6  MCH 29.1 28.9 28.0 27.9 28.6  MCHC 31.0 30.8 30.8 30.8 31.2  RDW 13.9 13.7 13.3 13.3 13.6  LYMPHSABS 1.4  --   --   --   --   MONOABS 1.0  --   --   --   --   EOSABS 0.1  --   --   --   --   BASOSABS 0.0  --   --   --   --     Chemistries   Recent Labs Lab 12/19/15 1717 12/20/15 0418 12/21/15 2054 12/22/15 0528 12/23/15 0310  NA 144 146*  --  140 142  K 3.9 3.8  --  3.4* 4.0  CL 108 109  --  104 111  CO2 29 28  --  25 21*  GLUCOSE 109* 103*  --  126* 93  BUN 29* 28*  --  19 24*  CREATININE 0.73 0.63 0.64 0.71 0.63  CALCIUM 8.9 9.0  --  8.6* 8.4*  AST 61*  --   --   --   --   ALT 46  --   --   --   --   ALKPHOS 52  --   --   --   --   BILITOT 0.6  --   --   --   --    Coagulation profile  Recent Labs Lab 12/19/15 1717  INR 1.16   Inpatient Medications  Scheduled Meds: . collagenase   Topical Daily  . divalproex  500 mg Oral Q breakfast  . docusate sodium  100 mg Oral BID  . dorzolamide-timolol  1 drop Both Eyes BID  . enoxaparin (LOVENOX) injection  30 mg Subcutaneous Q24H  . famotidine  10 mg Oral BID  . feeding supplement (ENSURE ENLIVE)  237 mL Oral BID WC  . ferrous sulfate  325 mg Oral TID PC  . fludrocortisone  0.1 mg Oral q morning - 10a  . latanoprost  1 drop  Both Eyes QHS  . metoprolol tartrate  25 mg Oral BID  . pantoprazole  40 mg Oral Q0600  . polyethylene glycol  17 g Oral Daily  . potassium chloride SA  40 mEq Oral Daily  . QUEtiapine Fumarate  150 mg Oral QHS   Continuous Infusions: . 0.9 % NaCl with KCl 40 mEq / L 50 mL/hr (12/22/15 1546)  . lactated ringers 10 mL/hr at 12/21/15 1400   PRN Meds:.acetaminophen **OR** acetaminophen, acetaminophen, albuterol, HYDROcodone-acetaminophen, metoCLOPramide **OR** metoCLOPramide (REGLAN) injection, morphine injection, ondansetron **OR** ondansetron (ZOFRAN) IV, oxyCODONE, polyethylene glycol, senna  Micro Results Recent Results (from the past 240 hour(s))  Urine culture     Status: None   Collection Time: 12/19/15 11:11 PM  Result Value Ref Range Status   Specimen Description URINE, RANDOM  Final   Special Requests NONE  Final   Culture NO GROWTH Performed at [facility]   Final   Report Status 12/21/2015 FINAL  Final  Surgical pcr screen     Status: None   Collection Time: 12/20/15 11:32 PM  Result Value Ref Range Status   MRSA, PCR NEGATIVE NEGATIVE Final   Staphylococcus aureus NEGATIVE NEGATIVE Final    Comment:        The Xpert SA Assay (FDA approved for NASAL specimens in patients over 23 years of age), is one component of a comprehensive surveillance program.  Test performance has been validated by [facility] for patients greater than or equal to 27 year old. It is not intended to diagnose infection nor to guide or monitor treatment.   Culture, Urine     Status: None   Collection Time: 12/22/15  3:55 PM  Result Value Ref Range Status   Specimen Description URINE, CLEAN CATCH  Final   Special Requests NONE  Final   Culture NO GROWTH  Final   Report Status 12/23/2015 FINAL  Final    Radiology Reports Dg Chest 1 View  Result Date: 12/19/2015 CLINICAL DATA:  Hip fracture, fall EXAM: CHEST 1 VIEW COMPARISON:  12/15/2015 FINDINGS: Semi-erect view of the chest  demonstrates mild hazy bibasilar opacity, probable atelectasis. No large effusion. Cardiomediastinal silhouette stable with atherosclerosis. No pneumothorax. Skin fold artifact over the left chest. IMPRESSION: Hazy bibasilar atelectasis. Atherosclerosis of the aorta Electronically Signed   By: Donavan Foil M.D.   On: 12/19/2015 18:11   Ct Head Wo Contrast  Result Date: 12/11/2015 CLINICAL DATA:  Altered mental status. EXAM: CT HEAD WITHOUT CONTRAST  TECHNIQUE: Contiguous axial images were obtained from the base of the skull through the vertex without intravenous contrast. COMPARISON:  CT scan of November 23, 2015. FINDINGS: Brain: Mild chronic ischemic white matter disease is noted. No mass effect or midline shift is noted. Ventricular size is within normal limits. There is no evidence of mass lesion, hemorrhage or acute infarction. Vascular: Atherosclerosis of carotid siphons is noted. Skull: Bony calvarium appears intact. Sinuses/Orbits: Paranasal sinuses appear normal. Other: None. IMPRESSION: Mild chronic ischemic white matter disease. No acute intracranial abnormality seen. Electronically Signed   By: Marijo Conception, M.D.   On: 12/11/2015 20:09   Ct Head Wo Contrast  Result Date: 11/23/2015 CLINICAL DATA:  Fall. Initial encounter. EXAM: CT HEAD WITHOUT CONTRAST CT CERVICAL SPINE WITHOUT CONTRAST TECHNIQUE: Multidetector CT imaging of the head and cervical spine was performed following the standard protocol without intravenous contrast. Multiplanar CT image reconstructions of the cervical spine were also generated. COMPARISON:  01/26/2015 FINDINGS: CT HEAD FINDINGS Brain: No evidence of acute infarction, hemorrhage, hydrocephalus, extra-axial collection or mass lesion/mass effect. Generalized atrophy. Mild for age chronic microvascular ischemic gliosis in the cerebral white matter. Small remote right cerebellar infarct Vascular: Atherosclerotic calcification. Skull: Scalp lacerations or indentations  without underlying fracture or opaque foreign body. Sinuses/Orbits: Bilateral cataract resection.  No acute finding. CT CERVICAL SPINE FINDINGS Alignment: Chronic C4-5 anterolisthesis, measuring 3 mm. There is new kyphotic deformity at C5-6 as described below. Skull base and vertebrae: C3-4 and C5-6 anterior cervical discectomy with solid bony fusion. C6-7 non segmentation. Interval fracture through the fused C5-6 vertebrae with kyphotic deformity. Fracture is still seen across the posterior elements at the level of the facets and lamina. The fracture appears chronic, with healed appearance to the vertebral body fracture. No acute fracture is detected. Soft tissues and spinal canal: No prevertebral fluid or swelling. No visible canal hematoma. Disc levels: Diffuse disc and facet degeneration. Bulky transverse ligamentous thickening without foramen magnum stenosis. Upper chest: Pleural based scarring seen at the left apex. IMPRESSION: 1. No evidence of acute intracranial or cervical spine injury. 2. Interval but remote fracture across the C5-6 fused vertebral bodies and posterior elements with healed kyphotic deformity. Incomplete posterior element fracture lines are still visible. 3. Previous C3-4 and C5-6 ACDF. C6-7 non segmentation. Severe adjacent segment facet degeneration at C4-5 with chronic 3 mm anterolisthesis. Electronically Signed   By: Monte Fantasia M.D.   On: 11/23/2015 17:29   Ct Cervical Spine Wo Contrast  Result Date: 11/23/2015 CLINICAL DATA:  Fall. Initial encounter. EXAM: CT HEAD WITHOUT CONTRAST CT CERVICAL SPINE WITHOUT CONTRAST TECHNIQUE: Multidetector CT imaging of the head and cervical spine was performed following the standard protocol without intravenous contrast. Multiplanar CT image reconstructions of the cervical spine were also generated. COMPARISON:  01/26/2015 FINDINGS: CT HEAD FINDINGS Brain: No evidence of acute infarction, hemorrhage, hydrocephalus, extra-axial collection or  mass lesion/mass effect. Generalized atrophy. Mild for age chronic microvascular ischemic gliosis in the cerebral white matter. Small remote right cerebellar infarct Vascular: Atherosclerotic calcification. Skull: Scalp lacerations or indentations without underlying fracture or opaque foreign body. Sinuses/Orbits: Bilateral cataract resection.  No acute finding. CT CERVICAL SPINE FINDINGS Alignment: Chronic C4-5 anterolisthesis, measuring 3 mm. There is new kyphotic deformity at C5-6 as described below. Skull base and vertebrae: C3-4 and C5-6 anterior cervical discectomy with solid bony fusion. C6-7 non segmentation. Interval fracture through the fused C5-6 vertebrae with kyphotic deformity. Fracture is still seen across the posterior elements at the level of  the facets and lamina. The fracture appears chronic, with healed appearance to the vertebral body fracture. No acute fracture is detected. Soft tissues and spinal canal: No prevertebral fluid or swelling. No visible canal hematoma. Disc levels: Diffuse disc and facet degeneration. Bulky transverse ligamentous thickening without foramen magnum stenosis. Upper chest: Pleural based scarring seen at the left apex. IMPRESSION: 1. No evidence of acute intracranial or cervical spine injury. 2. Interval but remote fracture across the C5-6 fused vertebral bodies and posterior elements with healed kyphotic deformity. Incomplete posterior element fracture lines are still visible. 3. Previous C3-4 and C5-6 ACDF. C6-7 non segmentation. Severe adjacent segment facet degeneration at C4-5 with chronic 3 mm anterolisthesis. Electronically Signed   By: Monte Fantasia M.D.   On: 11/23/2015 17:29   Dg Pelvis Portable  Result Date: 12/21/2015 CLINICAL DATA:  Right hip replacement EXAM: PORTABLE PELVIS 1-2 VIEWS COMPARISON:  12/19/2015 FINDINGS: AP portable supine view of the pelvis. Only the lower pelvis is included. The patient is status post right hip replacement with  satisfactory alignment. Soft tissue staples laterally. Small amount of soft tissue gas, postoperative. IMPRESSION: A interim right hip replacement with satisfactory alignment. Electronically Signed   By: Donavan Foil M.D.   On: 12/21/2015 20:11   Dg Chest Port 1 View  Result Date: 12/22/2015 CLINICAL DATA:  Fevers EXAM: PORTABLE CHEST 1 VIEW COMPARISON:  12/19/2015 FINDINGS: Increasing right-sided pleural effusion is noted. The cardiac shadow is stable. No focal infiltrate is seen. No bony abnormality is noted. IMPRESSION: Slight increase in right-sided pleural effusion. No other focal abnormality is noted. Electronically Signed   By: Inez Catalina M.D.   On: 12/22/2015 14:01   Dg Chest Port 1 View  Result Date: 12/15/2015 CLINICAL DATA:  82 year old female with pneumonia. Subsequent encounter. EXAM: PORTABLE CHEST 1 VIEW COMPARISON:  12/11/2015, 08/12/2015 and 01/26/2015. FINDINGS: Slightly asymmetric airspace disease greater on the right which may represent a mild pulmonary edema. No segmental consolidation. Prominent skin fold on left without pneumothorax. Calcified tortuous aorta. Heart size within normal limits. No plain film evidence of pulmonary malignancy. Shoulder joint degenerative changes and scoliosis thoracic spine. IMPRESSION: Findings suggestive of mild pulmonary edema. Electronically Signed   By: Genia Del M.D.   On: 12/15/2015 13:08   Dg Chest Port 1 View  Result Date: 12/11/2015 CLINICAL DATA:  Back pain. EXAM: PORTABLE CHEST 1 VIEW COMPARISON:  August 12, 2015 FINDINGS: The heart size borderline. The hila and mediastinum are unchanged. No pulmonary nodules or masses. Mild interstitial prominence in the right chest, new in the interval. No other acute abnormalities. IMPRESSION: Mild interstitial prominence in the right lung, asymmetric to the left could represent asymmetric edema or an atypical infection. Recommend clinical correlation and follow-up to resolution. Electronically  Signed   By: Dorise Bullion III M.D   On: 12/11/2015 16:16   Dg Hip Unilat With Pelvis 2-3 Views Right  Result Date: 12/19/2015 CLINICAL DATA:  Fall at nursing facility EXAM: DG HIP (WITH OR WITHOUT PELVIS) 2-3V RIGHT COMPARISON:  01/27/2015 FINDINGS: The left hip demonstrates normal positioning. The pubic symphysis is intact. There is a mildly comminuted right femoral neck fracture with mild superior migration of the distal femur. The right femoral head remains seated within the acetabulum. There is possible underlying lucency within the superolateral femoral neck. There is mild varus angulation. IMPRESSION: 1. Mildly comminuted, displaced and angulated right femoral neck fracture. Possible lucency within the underlying superolateral femoral neck, pathologic fracture cannot be excluded. 2. The  left hip is within normal limits Electronically Signed   By: Donavan Foil M.D.   On: 12/19/2015 18:09    Time Spent in minutes  25  MAGICK-Aniela Caniglia M.D on 12/23/2015 at 12:18 PM  Between 7am to 7pm - Pager - [phone]  After 7pm go to [email or web address] - password [facility]  [name] -  Office  [phone]

## 2015-12-23 NOTE — Progress Notes (Signed)
   Subjective:  Patient reports pain as mild.  Sleeping.  Objective:   VITALS:   Vitals:   12/22/15 2230 12/23/15 0253 12/23/15 0634 12/23/15 0703  BP: 127/60 140/67 137/85   Pulse: 75 76 81   Resp: 20     Temp: (!) 101.9 F (38.8 C) (!) 100.6 F (38.1 C) 99.6 F (37.6 C) 99.1 F (37.3 C)  TempSrc: Rectal Rectal Rectal Rectal  SpO2: 91% 95% 95%   Weight:      Height:        Neurologically intact responds to stimulation in the right foot Not following commands Dressing is c/d/i, rolled up some distally, will change before dc home  Lab Results  Component Value Date   WBC 13.6 (H) 12/23/2015   HGB 8.8 (L) 12/23/2015   HCT 28.2 (L) 12/23/2015   MCV 91.6 12/23/2015   PLT 284 12/23/2015   BMET    Component Value Date/Time   NA 142 12/23/2015 0310   NA 142 12/15/2015   K 4.0 12/23/2015 0310   CL 111 12/23/2015 0310   CO2 21 (L) 12/23/2015 0310   GLUCOSE 93 12/23/2015 0310   BUN 24 (H) 12/23/2015 0310   BUN 15 12/15/2015   CREATININE 0.63 12/23/2015 0310   CREATININE 0.85 11/09/2013 1135   CALCIUM 8.4 (L) 12/23/2015 0310   GFRNONAA >60 12/23/2015 0310   GFRNONAA 64 11/09/2013 1135   GFRAA >60 12/23/2015 0310   GFRAA 74 11/09/2013 1135     Assessment/Plan: 2 Days Post-Op   Principal Problem:   Closed right hip fracture, initial encounter ([place]) Active Problems:   COPD (chronic obstructive pulmonary disease) (HCC)   Osteoporosis   Glaucoma   Protein-calorie malnutrition, severe ([place])   CKD (chronic kidney disease), stage III   Encephalopathy   Decubitus ulcer of hip, stage 3 ([place])    Up with therapy PT/OT WBAT RLE Posterior hip precautions Follow up instructions left on chart   [name] 12/23/2015, 7:59 AM   Geralynn Rile, MD [phone]

## 2015-12-23 NOTE — Progress Notes (Signed)
CRITICAL VALUE ALERT  Critical value received:  Lactic acid 2.6  Date of notification:  11/10  Time of notification:  1621  Critical value read back:Yes.    Nurse who received alert:  Ronnald Nian  MD notified (1st page):  Dr. Doyle Askew  Time of first page:  1622  Responding MD:  Dr. Doyle Askew  Time MD responded:  [phone]

## 2015-12-23 NOTE — Progress Notes (Signed)
Notified by RN elevated lactic acid, pt already on empiric ABX as an infection suspected but source unknown at this time,   Faye Ramsay, MD  [name] Pager [phone]  If 7PM-7AM, please contact night-coverage [email or web address] Password [email or web address]

## 2015-12-24 ENCOUNTER — Encounter: Payer: Self-pay | Admitting: Internal Medicine

## 2015-12-24 LAB — BASIC METABOLIC PANEL
Anion gap: 8 (ref 5–15)
BUN: 19 mg/dL (ref 4–21)
BUN: 19 mg/dL (ref 6–20)
CALCIUM: 8.4 mg/dL — AB (ref 8.9–10.3)
CO2: 22 mmol/L (ref 22–32)
CREATININE: 0.6 mg/dL (ref 0.5–1.1)
CREATININE: 0.6 mg/dL (ref 0.44–1.00)
Chloride: 114 mmol/L — ABNORMAL HIGH (ref 101–111)
GFR calc Af Amer: >60 mL/min (ref >60)
GLUCOSE: 94 mg/dL (ref 65–99)
Glucose: 94 mg/dL
POTASSIUM: 3.5 mmol/L (ref 3.4–5.3)
Potassium: 3.5 mmol/L (ref 3.5–5.1)
Sodium: 144 mmol/L (ref 135–145)
Sodium: 144 mmol/L (ref 137–147)

## 2015-12-24 LAB — CBC
HCT: 25.8 % — ABNORMAL LOW (ref 36.0–46.0)
Hemoglobin: 8.1 g/dL — ABNORMAL LOW (ref 12.0–15.0)
MCH: 28.5 pg (ref 26.0–34.0)
MCHC: 31.4 g/dL (ref 30.0–36.0)
MCV: 90.8 fL (ref 78.0–100.0)
PLATELETS: 269 10*3/uL (ref 150–400)
RBC: 2.84 MIL/uL — ABNORMAL LOW (ref 3.87–5.11)
RDW: 13.7 % (ref 11.5–15.5)
WBC: 9.2 10*3/uL (ref 4.0–10.5)

## 2015-12-24 LAB — PROCALCITONIN: Procalcitonin: 1.98 ng/mL

## 2015-12-24 LAB — CBC AND DIFFERENTIAL
HCT: 26 % — AB (ref 36–46)
Hemoglobin: 8.1 g/dL — AB (ref 12.0–16.0)
Platelets: 269 10*3/uL (ref 150–399)
WBC: 9.2 10*3/mL

## 2015-12-24 MED ORDER — VALPROATE SODIUM 500 MG/5ML IV SOLN
500.0000 mg | Freq: Every day | INTRAVENOUS | Status: DC
  Administered 2015-12-24 – 2015-12-28 (×5): 500 mg via INTRAVENOUS
  Filled 2015-12-24 (×6): qty 5

## 2015-12-24 MED ORDER — TRAMADOL HCL 50 MG PO TABS
50.0000 mg | ORAL_TABLET | Freq: Four times a day (QID) | ORAL | Status: DC | PRN
  Administered 2015-12-24 – 2015-12-28 (×7): 50 mg via ORAL
  Filled 2015-12-24 (×7): qty 1

## 2015-12-24 NOTE — Progress Notes (Signed)
PROGRESS NOTE                                                                                                                                                      Patient Demographics:    Taylor Roth, is a 82 y.o. female, DOB - 1928-05-03, MVE:[id number]  Admit date - 12/19/2015   Admitting Physician MAGICK-MYERS, ISKRA  LOS - 5  Brief Narrative   82 year old female with bipolar disorder, mild dementia, hypertension, benzos and narcotics use, CKD stage IV, COPD, GERD, depression who was recently hospitalized for pneumonia and COPD exacerbation and discharged to skilled nursing facility. Patient was participating with physical therapy at the facility but reportedly had an unwitnessed fall 2 days prior to admission. Since then she was not participating with PT and remained in bed. When daughter went to visit her on the day of admission she was found to be very confused and lethargic.   Subjective:   Patient is still rather sleepy this AM, able to open eyes but quickly doses off.    Assessment  & Plan :   Principal Problem: Closed right hip fracture, initial encounter ([place]) - Suspect secondary to mechanical fall. - s/p open treatment of femoral fracture, proximal end, neck, prosthetic replacement, postop day #3 - ortho team following, appreciate assistance  - up with therapy  Active Problems: Acute encephalopathy - recent pneumonia, hospitalization and use of narcotics. - more alert this AM  Post op fever up to 102.2 F 11/9 - 11/10 - pt met criteria for sepsis, source RLL PNA, aspiration PNA, unknown pathogen  - aspiration PNA confirmed with CT chest - no pulmonary emboli noted  - fever curve trending down - keep on ABX day #2 until we get more esults back   Hypernatremia, hypokalemia  - mild, suspect from pre renal etiology - resolved with IVF but oral intake is minimal  - supplement K via IV as pt not eating  much yet - BMP in AM  COPD (chronic obstructive pulmonary disease) (HCC) - maintaining oxygen saturations at target range  Protein-calorie malnutrition, severe ([place]) - Nutritionist consulted. - advanced diet  - SLP eval as pt at risk for aspiration   CKD (chronic kidney disease), stage III - Renal function stable. Continue to monitor. - BMP in AM  Decubitus ulcer of hip, stage 3 ([place]) - Seen by wound care  - Recommend mattress replacement for pressure redistribution, prevalon boots for heel pressure injury protection.  Depression/bipolar disorder - continue home medications  Code Status : DO NOT RESUSCITATE  Family Communication  : daughter at bedside   Disposition Plan  : Needs SNF  Consults  : Dr. Lorre Nick (orthopedics)  Procedures  : Hip surgery   DVT Prophylaxis  : Lovenox SQ  Lab Results  Component Value Date   PLT 269 12/24/2015    Antibiotics  :  Pre op  Anti-infectives    Start     Dose/Rate Route Frequency Ordered Stop   12/24/15 1330  vancomycin (VANCOCIN) IVPB 750 mg/150 ml premix     750 mg 150 mL/hr over 60 Minutes Intravenous Every 24 hours 12/23/15 1403     12/23/15 2000  piperacillin-tazobactam (ZOSYN) IVPB 3.375 g     3.375 g 12.5 mL/hr over 240 Minutes Intravenous Every 8 hours 12/23/15 1403     12/23/15 1245  piperacillin-tazobactam (ZOSYN) IVPB 3.375 g     3.375 g 100 mL/hr over 30 Minutes Intravenous  Once 12/23/15 1234 12/23/15 1411   12/23/15 1245  vancomycin (VANCOCIN) IVPB 1000 mg/200 mL premix     1,000 mg 200 mL/hr over 60 Minutes Intravenous  Once 12/23/15 1234 12/23/15 1441   12/21/15 2015  ceFAZolin (ANCEF) IVPB 1 g/50 mL premix     1 g 100 mL/hr over 30 Minutes Intravenous Every 6 hours 12/21/15 2003 12/22/15 1059   12/21/15 1330  ceFAZolin (ANCEF) IVPB 2g/100 mL premix     2 g 200 mL/hr over 30 Minutes Intravenous To [facility] 12/20/15 2217 12/21/15 1414        Objective:   Vitals:   12/24/15 0610 12/24/15 0920  12/24/15 1030 12/24/15 1331  BP: (!) 129/92 120/68 117/67 116/62  Pulse: 79  82 76  Resp: 17 17  17   Temp: 100.2 F (37.9 C) 99.9 F (37.7 C)  98.2 F (36.8 C)  TempSrc: Rectal Oral  Oral  SpO2: 96% 100%  99%  Weight:      Height:        Wt Readings from Last 3 Encounters:  12/19/15 45.2 kg (99 lb 10.4 oz)  12/19/15 45.4 kg (100 lb)  12/16/15 46.3 kg (102 lb 1.2 oz)     Intake/Output Summary (Last 24 hours) at 12/24/15 1422 Last data filed at 12/24/15 0610  Gross per 24 hour  Intake           745.83 ml  Output              500 ml  Net           245.83 ml   Physical Exam Gen: more alert but rather slow, able to answer questions and follow most of the commands  HEENT: no pallor, dry MM, supple neck, temporal wasting Chest: diminished breath sounds at bases  CVS: N S1&S2, no murmurs, rubs or gallop GI: soft, NT, ND, BS+, Foley in place    Data Review:   CBC  Recent Labs Lab 12/19/15 1717 12/20/15 0418 12/21/15 2054 12/22/15 0528 12/23/15 0310 12/24/15 0534  WBC 7.8 7.1 10.4 11.7* 13.6* 9.2  HGB 10.9* 10.5* 10.8* 10.2* 8.8* 8.1*  HCT 35.2* 34.1* 35.1* 33.1* 28.2* 25.8*  PLT 285 272 287 254 284 269  MCV 93.9 93.9 90.9 90.7 91.6 90.8  MCH 29.1 28.9 28.0 27.9 28.6 28.5  MCHC 31.0 30.8 30.8 30.8 31.2 31.4  RDW 13.9 13.7 13.3 13.3 13.6 13.7  LYMPHSABS 1.4  --   --   --   --   --   MONOABS 1.0  --   --   --   --   --   EOSABS 0.1  --   --   --   --   --  BASOSABS 0.0  --   --   --   --   --     Chemistries   Recent Labs Lab 12/19/15 1717 12/20/15 0418 12/21/15 2054 12/22/15 0528 12/23/15 0310 12/24/15 0534  NA 144 146*  --  140 142 144  K 3.9 3.8  --  3.4* 4.0 3.5  CL 108 109  --  104 111 114*  CO2 29 28  --  25 21* 22  GLUCOSE 109* 103*  --  126* 93 94  BUN 29* 28*  --  19 24* 19  CREATININE 0.73 0.63 0.64 0.71 0.63 0.60  CALCIUM 8.9 9.0  --  8.6* 8.4* 8.4*  AST 61*  --   --   --   --   --   ALT 46  --   --   --   --   --   ALKPHOS 52  --   --    --   --   --   BILITOT 0.6  --   --   --   --   --    Coagulation profile  Recent Labs Lab 12/19/15 1717 12/23/15 1312  INR 1.16 1.75   Inpatient Medications  Scheduled Meds: . collagenase   Topical Daily  . divalproex  500 mg Oral Q breakfast  . docusate sodium  100 mg Oral BID  . dorzolamide-timolol  1 drop Both Eyes BID  . enoxaparin (LOVENOX) injection  30 mg Subcutaneous Q24H  . famotidine  10 mg Oral BID  . feeding supplement (ENSURE ENLIVE)  237 mL Oral BID WC  . fludrocortisone  0.1 mg Oral q morning - 10a  . latanoprost  1 drop Both Eyes QHS  . metoprolol tartrate  25 mg Oral BID  . pantoprazole  40 mg Oral Q0600  . piperacillin-tazobactam (ZOSYN)  IV  3.375 g Intravenous Q8H  . polyethylene glycol  17 g Oral Daily  . vancomycin  750 mg Intravenous Q24H   Continuous Infusions: . 0.9 % NaCl with KCl 40 mEq / L 50 mL/hr at 12/23/15 1341   PRN Meds:.acetaminophen **OR** acetaminophen, acetaminophen, albuterol, ondansetron **OR** ondansetron (ZOFRAN) IV, polyethylene glycol, senna  Micro Results Recent Results (from the past 240 hour(s))  Urine culture     Status: None   Collection Time: 12/19/15 11:11 PM  Result Value Ref Range Status   Specimen Description URINE, RANDOM  Final   Special Requests NONE  Final   Culture NO GROWTH Performed at [facility]   Final   Report Status 12/21/2015 FINAL  Final  Surgical pcr screen     Status: None   Collection Time: 12/20/15 11:32 PM  Result Value Ref Range Status   MRSA, PCR NEGATIVE NEGATIVE Final   Staphylococcus aureus NEGATIVE NEGATIVE Final    Comment:        The Xpert SA Assay (FDA approved for NASAL specimens in patients over 53 years of age), is one component of a comprehensive surveillance program.  Test performance has been validated by [facility] for patients greater than or equal to 57 year old. It is not intended to diagnose infection nor to guide or monitor treatment.   Culture, Urine      Status: None   Collection Time: 12/22/15  3:55 PM  Result Value Ref Range Status   Specimen Description URINE, CLEAN CATCH  Final   Special Requests NONE  Final   Culture NO GROWTH  Final   Report Status 12/23/2015  FINAL  Final  Culture, blood (x 2)     Status: None (Preliminary result)   Collection Time: 12/23/15  1:12 PM  Result Value Ref Range Status   Specimen Description BLOOD BLOOD RIGHT ARM  Final   Special Requests BOTTLES DRAWN AEROBIC ONLY 6CC  Final   Culture NO GROWTH < 24 HOURS  Final   Report Status PENDING  Incomplete  Culture, blood (x 2)     Status: None (Preliminary result)   Collection Time: 12/23/15  1:17 PM  Result Value Ref Range Status   Specimen Description BLOOD BLOOD RIGHT HAND  Final   Special Requests IN PEDIATRIC BOTTLE 2CC  Final   Culture NO GROWTH < 24 HOURS  Final   Report Status PENDING  Incomplete    Radiology Reports Dg Chest 1 View  Result Date: 12/19/2015 CLINICAL DATA:  Hip fracture, fall EXAM: CHEST 1 VIEW COMPARISON:  12/15/2015 FINDINGS: Semi-erect view of the chest demonstrates mild hazy bibasilar opacity, probable atelectasis. No large effusion. Cardiomediastinal silhouette stable with atherosclerosis. No pneumothorax. Skin fold artifact over the left chest. IMPRESSION: Hazy bibasilar atelectasis. Atherosclerosis of the aorta Electronically Signed   By: Donavan Foil M.D.   On: 12/19/2015 18:11   Ct Head Wo Contrast  Result Date: 12/11/2015 CLINICAL DATA:  Altered mental status. EXAM: CT HEAD WITHOUT CONTRAST TECHNIQUE: Contiguous axial images were obtained from the base of the skull through the vertex without intravenous contrast. COMPARISON:  CT scan of November 23, 2015. FINDINGS: Brain: Mild chronic ischemic white matter disease is noted. No mass effect or midline shift is noted. Ventricular size is within normal limits. There is no evidence of mass lesion, hemorrhage or acute infarction. Vascular: Atherosclerosis of carotid siphons is  noted. Skull: Bony calvarium appears intact. Sinuses/Orbits: Paranasal sinuses appear normal. Other: None. IMPRESSION: Mild chronic ischemic white matter disease. No acute intracranial abnormality seen. Electronically Signed   By: Marijo Conception, M.D.   On: 12/11/2015 20:09   Ct Angio Chest Pe W Or Wo Contrast  Result Date: 12/23/2015 CLINICAL DATA:  F/U on fever. H/O dysphagia, COPD, pneumonia. Pt was very feisty, kinked up for scan. Pt tried to pull iv in the middle of scan causing extravasation with the kinky posture. Scan repeated and with new iv. 70 ML ISOVUE 370 and 60 .*comment was truncated* EXAM: CT ANGIOGRAPHY CHEST WITH CONTRAST TECHNIQUE: Multidetector CT imaging of the chest was performed using the standard protocol during bolus administration of intravenous contrast. Multiplanar CT image reconstructions and MIPs were obtained to evaluate the vascular anatomy. CONTRAST:  130 cc Isovue COMPARISON:  None FINDINGS: Cardiovascular: No filling defects within pulmonary to suggest acute pulmonary embolism. No pericardial fluid. No acute findings aorta great vessels. Mediastinum/Nodes: No axillary or supraclavicular lymphadenopathy. No mediastinal hilar lymphadenopathy. No pericardial fluid. Lungs/Pleura: Bilateral small effusions. There is mild airspace disease in the RIGHT lower lobe. Centrilobular emphysema in the upper lobes. Upper Abdomen: Limited view of the liver, kidneys, pancreas are unremarkable. Normal adrenal glands. Musculoskeletal: Kyphoscoliosis.  No acute findings Review of the MIP images confirms the above findings. IMPRESSION: 1. No evidence acute pulmonary embolism. 2. Small bilateral pleural effusions and mild interstitial edema. 3. Mild airspace disease in the RIGHT lower lobe. Differential includes aspiration pneumonitis, pneumonia, or asymmetric edema. Electronically Signed   By: Suzy Bouchard M.D.   On: 12/23/2015 20:33   Dg Pelvis Portable  Result Date: 12/21/2015 CLINICAL  DATA:  Right hip replacement EXAM: PORTABLE PELVIS 1-2 VIEWS COMPARISON:  12/19/2015 FINDINGS: AP portable supine view of the pelvis. Only the lower pelvis is included. The patient is status post right hip replacement with satisfactory alignment. Soft tissue staples laterally. Small amount of soft tissue gas, postoperative. IMPRESSION: A interim right hip replacement with satisfactory alignment. Electronically Signed   By: Donavan Foil M.D.   On: 12/21/2015 20:11   Dg Chest Port 1 View  Result Date: 12/22/2015 CLINICAL DATA:  Fevers EXAM: PORTABLE CHEST 1 VIEW COMPARISON:  12/19/2015 FINDINGS: Increasing right-sided pleural effusion is noted. The cardiac shadow is stable. No focal infiltrate is seen. No bony abnormality is noted. IMPRESSION: Slight increase in right-sided pleural effusion. No other focal abnormality is noted. Electronically Signed   By: Inez Catalina M.D.   On: 12/22/2015 14:01   Dg Chest Port 1 View  Result Date: 12/15/2015 CLINICAL DATA:  82 year old female with pneumonia. Subsequent encounter. EXAM: PORTABLE CHEST 1 VIEW COMPARISON:  12/11/2015, 08/12/2015 and 01/26/2015. FINDINGS: Slightly asymmetric airspace disease greater on the right which may represent a mild pulmonary edema. No segmental consolidation. Prominent skin fold on left without pneumothorax. Calcified tortuous aorta. Heart size within normal limits. No plain film evidence of pulmonary malignancy. Shoulder joint degenerative changes and scoliosis thoracic spine. IMPRESSION: Findings suggestive of mild pulmonary edema. Electronically Signed   By: Genia Del M.D.   On: 12/15/2015 13:08   Dg Chest Port 1 View  Result Date: 12/11/2015 CLINICAL DATA:  Back pain. EXAM: PORTABLE CHEST 1 VIEW COMPARISON:  August 12, 2015 FINDINGS: The heart size borderline. The hila and mediastinum are unchanged. No pulmonary nodules or masses. Mild interstitial prominence in the right chest, new in the interval. No other acute  abnormalities. IMPRESSION: Mild interstitial prominence in the right lung, asymmetric to the left could represent asymmetric edema or an atypical infection. Recommend clinical correlation and follow-up to resolution. Electronically Signed   By: Dorise Bullion III M.D   On: 12/11/2015 16:16   Dg Hip Unilat With Pelvis 2-3 Views Right  Result Date: 12/19/2015 CLINICAL DATA:  Fall at nursing facility EXAM: DG HIP (WITH OR WITHOUT PELVIS) 2-3V RIGHT COMPARISON:  01/27/2015 FINDINGS: The left hip demonstrates normal positioning. The pubic symphysis is intact. There is a mildly comminuted right femoral neck fracture with mild superior migration of the distal femur. The right femoral head remains seated within the acetabulum. There is possible underlying lucency within the superolateral femoral neck. There is mild varus angulation. IMPRESSION: 1. Mildly comminuted, displaced and angulated right femoral neck fracture. Possible lucency within the underlying superolateral femoral neck, pathologic fracture cannot be excluded. 2. The left hip is within normal limits Electronically Signed   By: Donavan Foil M.D.   On: 12/19/2015 18:09    Time Spent in minutes  25  Faye Ramsay M.D on 12/24/2015 at 2:22 PM  Between 7am to 7pm - Pager - [phone]  After 7pm go to [email or web address] - password [facility]  [name] -  Office  [phone]

## 2015-12-24 NOTE — Progress Notes (Signed)
Subjective: 3 Days Post-Op Procedure(s) (LRB): ARTHROPLASTY BIPOLAR HIP (HEMIARTHROPLASTY) (Right) Patient reports pain as 1 on 0-10 scale. Doing well. Will change dressing.. Case discussed with her daughter. Need to follow hher HBG. HBg today is 8.1.  Objective: Vital signs in last 24 hours: Temp:  [100 F (37.8 C)-101 F (38.3 C)] 100.2 F (37.9 C) (11/11 0610) Pulse Rate:  [79-88] 79 (11/11 0610) Resp:  [17-18] 17 (11/11 0610) BP: (117-129)/(64-92) 129/92 (11/11 0610) SpO2:  [96 %-99 %] 96 % (11/11 0610)  Intake/Output from previous day: 11/10 0701 - 11/11 0700 In: 2091.7 [I.V.:1791.7; IV Piggyback:300] Out: 925 [Urine:925] Intake/Output this shift: No intake/output data recorded.   Recent Labs  12/21/15 2054 12/22/15 0528 12/23/15 0310 12/24/15 0534  HGB 10.8* 10.2* 8.8* 8.1*    Recent Labs  12/23/15 0310 12/24/15 0534  WBC 13.6* 9.2  RBC 3.08* 2.84*  HCT 28.2* 25.8*  PLT 284 269    Recent Labs  12/23/15 0310 12/24/15 0534  NA 142 144  K 4.0 3.5  CL 111 114*  CO2 21* 22  BUN 24* 19  CREATININE 0.63 0.60  GLUCOSE 93 94  CALCIUM 8.4* 8.4*    Recent Labs  12/23/15 1312  INR 1.75    Dorsiflexion/Plantar flexion intact  Assessment/Plan: 3 Days Post-Op Procedure(s) (LRB): ARTHROPLASTY BIPOLAR HIP (HEMIARTHROPLASTY) (Right) Up with therapy  Adriene Padula A 12/24/2015, 8:04 AM

## 2015-12-25 LAB — BASIC METABOLIC PANEL
ANION GAP: 11 (ref 5–15)
BUN: 15 mg/dL (ref 6–20)
BUN: 15 mg/dL (ref 4–21)
CALCIUM: 8.2 mg/dL — AB (ref 8.9–10.3)
CO2: 19 mmol/L — AB (ref 22–32)
CREATININE: 0.64 mg/dL (ref 0.44–1.00)
Chloride: 110 mmol/L (ref 101–111)
Creatinine: 0.6 mg/dL (ref 0.5–1.1)
GLUCOSE: 86 mg/dL
GLUCOSE: 86 mg/dL (ref 65–99)
Potassium: 3.1 mmol/L — AB (ref 3.4–5.3)
Potassium: 3.1 mmol/L — ABNORMAL LOW (ref 3.5–5.1)
SODIUM: 140 mmol/L (ref 137–147)
Sodium: 140 mmol/L (ref 135–145)

## 2015-12-25 LAB — CBC
HEMATOCRIT: 27.3 % — AB (ref 36.0–46.0)
Hemoglobin: 8.4 g/dL — ABNORMAL LOW (ref 12.0–15.0)
MCH: 28 pg (ref 26.0–34.0)
MCHC: 30.8 g/dL (ref 30.0–36.0)
MCV: 91 fL (ref 78.0–100.0)
PLATELETS: 331 10*3/uL (ref 150–400)
RBC: 3 MIL/uL — ABNORMAL LOW (ref 3.87–5.11)
RDW: 14 % (ref 11.5–15.5)
WBC: 8.2 10*3/uL (ref 4.0–10.5)

## 2015-12-25 LAB — CBC AND DIFFERENTIAL
HCT: 27 % — AB (ref 36–46)
HEMOGLOBIN: 8.4 g/dL — AB (ref 12.0–16.0)
Platelets: 331 10*3/uL (ref 150–399)
WBC: 8.2 10^3/mL

## 2015-12-25 MED ORDER — MAGIC MOUTHWASH: 10.0000 mL | Freq: Three times a day (TID) | ORAL | Status: DC | PRN

## 2015-12-25 MED ORDER — LEVOFLOXACIN IN D5W 750 MG/150ML IV SOLN
750.0000 mg | INTRAVENOUS | Status: AC
  Administered 2015-12-25 – 2015-12-29 (×3): 750 mg via INTRAVENOUS
  Filled 2015-12-25 (×3): qty 150

## 2015-12-25 MED ORDER — FLUCONAZOLE IN SODIUM CHLORIDE 100-0.9 MG/50ML-% IV SOLN
100.0000 mg | INTRAVENOUS | Status: DC
  Administered 2015-12-25 – 2015-12-28 (×4): 100 mg via INTRAVENOUS
  Filled 2015-12-25 (×8): qty 50

## 2015-12-25 MED ORDER — PHENOL 1.4 % MT LIQD: 1.0000 | OROMUCOSAL | Status: DC | PRN

## 2015-12-25 NOTE — Progress Notes (Addendum)
PROGRESS NOTE                                                                                                                                                      Patient Demographics:    Taylor Roth, is a 82 y.o. female, DOB - [birth year], UXN:[id number]  Admit date - 12/19/2015   Admitting Physician MAGICK-Cyncere Sontag  LOS - 6  Brief Narrative   82 year old female with bipolar disorder, mild dementia, hypertension, benzos and narcotics use, CKD stage IV, COPD, GERD, depression who was recently hospitalized for pneumonia and COPD exacerbation and discharged to skilled nursing facility. Patient was participating with physical therapy at the facility but reportedly had an unwitnessed fall 2 days prior to admission. Since then she was not participating with PT and remained in bed. When daughter went to visit her on the day of admission she was found to be very confused and lethargic.   Subjective:   Patient is more alert this AM.   Assessment  & Plan :   Principal Problem: Closed right hip fracture, initial encounter ([place]) - Suspect secondary to mechanical fall. - s/p open treatment of femoral fracture, proximal end, neck, prosthetic replacement, postop day #3 - ortho team following, appreciate assistance  - up with therapy  Active Problems: Acute metabolic encephalopathy - recent pneumonia, hospitalization and use of narcotics. - more alert this AM but still with poor oral intake   Post op fever up to 102.2 F 11/9 - 11/10 - pt met criteria for sepsis, source RLL PNA, aspiration PNA, unknown pathogen  - aspiration PNA confirmed with CT chest - no pulmonary emboli noted  - fever curve trending down - keep on ABX day #3, will change to Levaquin today   Oral Thrush - start Diflucan and mouth spray   Hypernatremia, hypokalemia  - mild, suspect from pre renal etiology - resolved with IVF but oral intake is minimal    - supplement K v - BMP in AM  COPD (chronic obstructive pulmonary disease) (HCC) - maintaining oxygen saturations at target range  Protein-calorie malnutrition, severe ([place]) - Nutritionist consulted. - advanced diet  - SLP eval as pt at risk for aspiration   CKD (chronic kidney disease), stage III - Renal function stable. Continue to monitor. - BMP in AM  Decubitus ulcer of hip, stage 3 ([place]) - Seen by wound care  - Recommend mattress replacement for pressure redistribution, prevalon boots for heel pressure injury protection.  Depression/bipolar disorder - continue home medications  Code Status : DO NOT RESUSCITATE  Family Communication  : daughter at bedside   Disposition Plan  : Needs  SNF  Consults  : Dr. Lorre Nick (orthopedics)  Procedures  : Hip surgery   DVT Prophylaxis  : Lovenox SQ  Lab Results  Component Value Date   PLT 331 12/25/2015    Antibiotics  :  Pre op  Anti-infectives    Start     Dose/Rate Route Frequency Ordered Stop   12/25/15 1200  levofloxacin (LEVAQUIN) IVPB 750 mg     750 mg 100 mL/hr over 90 Minutes Intravenous Every 48 hours 12/25/15 1018     12/25/15 1000  fluconazole (DIFLUCAN) IVPB 100 mg     100 mg 50 mL/hr over 60 Minutes Intravenous Every 24 hours 12/25/15 0904     12/24/15 1330  vancomycin (VANCOCIN) IVPB 750 mg/150 ml premix  Status:  Discontinued     750 mg 150 mL/hr over 60 Minutes Intravenous Every 24 hours 12/23/15 1403 12/25/15 0904   12/23/15 2000  piperacillin-tazobactam (ZOSYN) IVPB 3.375 g  Status:  Discontinued     3.375 g 12.5 mL/hr over 240 Minutes Intravenous Every 8 hours 12/23/15 1403 12/25/15 0904   12/23/15 1245  piperacillin-tazobactam (ZOSYN) IVPB 3.375 g     3.375 g 100 mL/hr over 30 Minutes Intravenous  Once 12/23/15 1234 12/23/15 1411   12/23/15 1245  vancomycin (VANCOCIN) IVPB 1000 mg/200 mL premix     1,000 mg 200 mL/hr over 60 Minutes Intravenous  Once 12/23/15 1234 12/23/15 1441   12/21/15 2015   ceFAZolin (ANCEF) IVPB 1 g/50 mL premix     1 g 100 mL/hr over 30 Minutes Intravenous Every 6 hours 12/21/15 2003 12/22/15 1059   12/21/15 1330  ceFAZolin (ANCEF) IVPB 2g/100 mL premix     2 g 200 mL/hr over 30 Minutes Intravenous To [facility] 12/20/15 2217 12/21/15 1414        Objective:   Vitals:   12/24/15 2035 12/25/15 0437 12/25/15 1109 12/25/15 1326  BP: 137/70 (!) 151/68 130/61 131/68  Pulse: 84 78 75 74  Resp: 17   16  Temp: 98.6 F (37 C) 99.6 F (37.6 C) 98.2 F (36.8 C) 97.9 F (36.6 C)  TempSrc: Axillary Oral Axillary Axillary  SpO2: 97% 94%  98%  Weight:      Height:        Wt Readings from Last 3 Encounters:  12/19/15 45.2 kg (99 lb 10.4 oz)  12/19/15 45.4 kg (100 lb)  12/16/15 46.3 kg (102 lb 1.2 oz)     Intake/Output Summary (Last 24 hours) at 12/25/15 1650 Last data filed at 12/25/15 1421  Gross per 24 hour  Intake             1110 ml  Output                2 ml  Net             1108 ml   Physical Exam Gen: more alert but rather slow, able to answer questions and follow most of the commands  HEENT: no pallor, dry MM, supple neck, temporal wasting Chest: diminished breath sounds at bases  CVS: N S1&S2, no murmurs, rubs or gallop GI: soft, NT, ND, BS+, Foley in place    Data Review:   CBC  Recent Labs Lab 12/19/15 1717  12/21/15 2054 12/22/15 0528 12/23/15 0310 12/24/15 0534 12/25/15 0358  WBC 7.8  < > 10.4 11.7* 13.6* 9.2 8.2  HGB 10.9*  < > 10.8* 10.2* 8.8* 8.1* 8.4*  HCT 35.2*  < > 35.1* 33.1* 28.2*  25.8* 27.3*  PLT 285  < > 287 254 284 269 331  MCV 93.9  < > 90.9 90.7 91.6 90.8 91.0  MCH 29.1  < > 28.0 27.9 28.6 28.5 28.0  MCHC 31.0  < > 30.8 30.8 31.2 31.4 30.8  RDW 13.9  < > 13.3 13.3 13.6 13.7 14.0  LYMPHSABS 1.4  --   --   --   --   --   --   MONOABS 1.0  --   --   --   --   --   --   EOSABS 0.1  --   --   --   --   --   --   BASOSABS 0.0  --   --   --   --   --   --   < > = values in this interval not  displayed.  Chemistries   Recent Labs Lab 12/19/15 1717 12/20/15 0418 12/21/15 2054 12/22/15 0528 12/23/15 0310 12/24/15 0534 12/25/15 0358  NA 144 146*  --  140 142 144 140  K 3.9 3.8  --  3.4* 4.0 3.5 3.1*  CL 108 109  --  104 111 114* 110  CO2 29 28  --  25 21* 22 19*  GLUCOSE 109* 103*  --  126* 93 94 86  BUN 29* 28*  --  19 24* 19 15  CREATININE 0.73 0.63 0.64 0.71 0.63 0.60 0.64  CALCIUM 8.9 9.0  --  8.6* 8.4* 8.4* 8.2*  AST 61*  --   --   --   --   --   --   ALT 46  --   --   --   --   --   --   ALKPHOS 52  --   --   --   --   --   --   BILITOT 0.6  --   --   --   --   --   --    Coagulation profile  Recent Labs Lab 12/19/15 1717 12/23/15 1312  INR 1.16 1.75   Inpatient Medications  Scheduled Meds: . collagenase   Topical Daily  . docusate sodium  100 mg Oral BID  . dorzolamide-timolol  1 drop Both Eyes BID  . enoxaparin (LOVENOX) injection  30 mg Subcutaneous Q24H  . famotidine  10 mg Oral BID  . feeding supplement (ENSURE ENLIVE)  237 mL Oral BID WC  . fluconazole (DIFLUCAN) IV  100 mg Intravenous Q24H  . fludrocortisone  0.1 mg Oral q morning - 10a  . latanoprost  1 drop Both Eyes QHS  . levofloxacin (LEVAQUIN) IV  750 mg Intravenous Q48H  . metoprolol tartrate  25 mg Oral BID  . pantoprazole  40 mg Oral Q0600  . polyethylene glycol  17 g Oral Daily  . valproate sodium  500 mg Intravenous Daily   Continuous Infusions: . 0.9 % NaCl with KCl 40 mEq / L 50 mL/hr (12/25/15 0830)   PRN Meds:.acetaminophen **OR** acetaminophen, acetaminophen, albuterol, magic mouthwash, ondansetron **OR** ondansetron (ZOFRAN) IV, phenol, polyethylene glycol, senna, traMADol  Micro Results Recent Results (from the past 240 hour(s))  Urine culture     Status: None   Collection Time: 12/19/15 11:11 PM  Result Value Ref Range Status   Specimen Description URINE, RANDOM  Final   Special Requests NONE  Final   Culture NO GROWTH Performed at [facility]   Final    Report Status 12/21/2015 FINAL  Final  Surgical pcr screen     Status: None   Collection Time: 12/20/15 11:32 PM  Result Value Ref Range Status   MRSA, PCR NEGATIVE NEGATIVE Final   Staphylococcus aureus NEGATIVE NEGATIVE Final    Comment:        The Xpert SA Assay (FDA approved for NASAL specimens in patients over 57 years of age), is one component of a comprehensive surveillance program.  Test performance has been validated by [facility] for patients greater than or equal to 67 year old. It is not intended to diagnose infection nor to guide or monitor treatment.   Culture, Urine     Status: None   Collection Time: 12/22/15  3:55 PM  Result Value Ref Range Status   Specimen Description URINE, CLEAN CATCH  Final   Special Requests NONE  Final   Culture NO GROWTH  Final   Report Status 12/23/2015 FINAL  Final  Culture, blood (x 2)     Status: None (Preliminary result)   Collection Time: 12/23/15  1:12 PM  Result Value Ref Range Status   Specimen Description BLOOD BLOOD RIGHT ARM  Final   Special Requests BOTTLES DRAWN AEROBIC ONLY 6CC  Final   Culture NO GROWTH 2 DAYS  Final   Report Status PENDING  Incomplete  Culture, blood (x 2)     Status: None (Preliminary result)   Collection Time: 12/23/15  1:17 PM  Result Value Ref Range Status   Specimen Description BLOOD BLOOD RIGHT HAND  Final   Special Requests IN PEDIATRIC BOTTLE 2CC  Final   Culture NO GROWTH 2 DAYS  Final   Report Status PENDING  Incomplete    Radiology Reports Dg Chest 1 View  Result Date: 12/19/2015 CLINICAL DATA:  Hip fracture, fall EXAM: CHEST 1 VIEW COMPARISON:  12/15/2015 FINDINGS: Semi-erect view of the chest demonstrates mild hazy bibasilar opacity, probable atelectasis. No large effusion. Cardiomediastinal silhouette stable with atherosclerosis. No pneumothorax. Skin fold artifact over the left chest. IMPRESSION: Hazy bibasilar atelectasis. Atherosclerosis of the aorta Electronically Signed    By: Donavan Foil M.D.   On: 12/19/2015 18:11   Ct Head Wo Contrast  Result Date: 12/11/2015 CLINICAL DATA:  Altered mental status. EXAM: CT HEAD WITHOUT CONTRAST TECHNIQUE: Contiguous axial images were obtained from the base of the skull through the vertex without intravenous contrast. COMPARISON:  CT scan of November 23, 2015. FINDINGS: Brain: Mild chronic ischemic white matter disease is noted. No mass effect or midline shift is noted. Ventricular size is within normal limits. There is no evidence of mass lesion, hemorrhage or acute infarction. Vascular: Atherosclerosis of carotid siphons is noted. Skull: Bony calvarium appears intact. Sinuses/Orbits: Paranasal sinuses appear normal. Other: None. IMPRESSION: Mild chronic ischemic white matter disease. No acute intracranial abnormality seen. Electronically Signed   By: Marijo Conception, M.D.   On: 12/11/2015 20:09   Ct Angio Chest Pe W Or Wo Contrast  Result Date: 12/23/2015 CLINICAL DATA:  F/U on fever. H/O dysphagia, COPD, pneumonia. Pt was very feisty, kinked up for scan. Pt tried to pull iv in the middle of scan causing extravasation with the kinky posture. Scan repeated and with new iv. 70 ML ISOVUE 370 and 60 .*comment was truncated* EXAM: CT ANGIOGRAPHY CHEST WITH CONTRAST TECHNIQUE: Multidetector CT imaging of the chest was performed using the standard protocol during bolus administration of intravenous contrast. Multiplanar CT image reconstructions and MIPs were obtained to evaluate the vascular anatomy. CONTRAST:  130 cc Isovue  COMPARISON:  None FINDINGS: Cardiovascular: No filling defects within pulmonary to suggest acute pulmonary embolism. No pericardial fluid. No acute findings aorta great vessels. Mediastinum/Nodes: No axillary or supraclavicular lymphadenopathy. No mediastinal hilar lymphadenopathy. No pericardial fluid. Lungs/Pleura: Bilateral small effusions. There is mild airspace disease in the RIGHT lower lobe. Centrilobular emphysema  in the upper lobes. Upper Abdomen: Limited view of the liver, kidneys, pancreas are unremarkable. Normal adrenal glands. Musculoskeletal: Kyphoscoliosis.  No acute findings Review of the MIP images confirms the above findings. IMPRESSION: 1. No evidence acute pulmonary embolism. 2. Small bilateral pleural effusions and mild interstitial edema. 3. Mild airspace disease in the RIGHT lower lobe. Differential includes aspiration pneumonitis, pneumonia, or asymmetric edema. Electronically Signed   By: Suzy Bouchard M.D.   On: 12/23/2015 20:33   Dg Pelvis Portable  Result Date: 12/21/2015 CLINICAL DATA:  Right hip replacement EXAM: PORTABLE PELVIS 1-2 VIEWS COMPARISON:  12/19/2015 FINDINGS: AP portable supine view of the pelvis. Only the lower pelvis is included. The patient is status post right hip replacement with satisfactory alignment. Soft tissue staples laterally. Small amount of soft tissue gas, postoperative. IMPRESSION: A interim right hip replacement with satisfactory alignment. Electronically Signed   By: Donavan Foil M.D.   On: 12/21/2015 20:11   Dg Chest Port 1 View  Result Date: 12/22/2015 CLINICAL DATA:  Fevers EXAM: PORTABLE CHEST 1 VIEW COMPARISON:  12/19/2015 FINDINGS: Increasing right-sided pleural effusion is noted. The cardiac shadow is stable. No focal infiltrate is seen. No bony abnormality is noted. IMPRESSION: Slight increase in right-sided pleural effusion. No other focal abnormality is noted. Electronically Signed   By: Inez Catalina M.D.   On: 12/22/2015 14:01   Dg Chest Port 1 View  Result Date: 12/15/2015 CLINICAL DATA:  82 year old female with pneumonia. Subsequent encounter. EXAM: PORTABLE CHEST 1 VIEW COMPARISON:  12/11/2015, 08/12/2015 and 01/26/2015. FINDINGS: Slightly asymmetric airspace disease greater on the right which may represent a mild pulmonary edema. No segmental consolidation. Prominent skin fold on left without pneumothorax. Calcified tortuous aorta. Heart size  within normal limits. No plain film evidence of pulmonary malignancy. Shoulder joint degenerative changes and scoliosis thoracic spine. IMPRESSION: Findings suggestive of mild pulmonary edema. Electronically Signed   By: Genia Del M.D.   On: 12/15/2015 13:08   Dg Chest Port 1 View  Result Date: 12/11/2015 CLINICAL DATA:  Back pain. EXAM: PORTABLE CHEST 1 VIEW COMPARISON:  August 12, 2015 FINDINGS: The heart size borderline. The hila and mediastinum are unchanged. No pulmonary nodules or masses. Mild interstitial prominence in the right chest, new in the interval. No other acute abnormalities. IMPRESSION: Mild interstitial prominence in the right lung, asymmetric to the left could represent asymmetric edema or an atypical infection. Recommend clinical correlation and follow-up to resolution. Electronically Signed   By: Dorise Bullion III M.D   On: 12/11/2015 16:16   Dg Hip Unilat With Pelvis 2-3 Views Right  Result Date: 12/19/2015 CLINICAL DATA:  Fall at nursing facility EXAM: DG HIP (WITH OR WITHOUT PELVIS) 2-3V RIGHT COMPARISON:  01/27/2015 FINDINGS: The left hip demonstrates normal positioning. The pubic symphysis is intact. There is a mildly comminuted right femoral neck fracture with mild superior migration of the distal femur. The right femoral head remains seated within the acetabulum. There is possible underlying lucency within the superolateral femoral neck. There is mild varus angulation. IMPRESSION: 1. Mildly comminuted, displaced and angulated right femoral neck fracture. Possible lucency within the underlying superolateral femoral neck, pathologic fracture cannot be excluded. 2.  The left hip is within normal limits Electronically Signed   By: Donavan Foil M.D.   On: 12/19/2015 18:09    Time Spent in minutes  25  Faye Ramsay M.D on 12/25/2015 at 4:50 PM  Between 7am to 7pm - Pager - [phone]  After 7pm go to [email or web address] - password [facility]  [name] -  Office   [phone]

## 2015-12-25 NOTE — Progress Notes (Signed)
Foley catheter has been in since 11/6. Nursing notes indicate that MD was notified and elected to keep catheter in.  Need order to continue foley catheter. Will remove catheter today after consulting with MD.

## 2015-12-25 NOTE — Progress Notes (Addendum)
Pharmacy Antibiotic Note  Taylor Roth is a 82 y.o. female admitted on 12/19/2015 with confusion and hip fracture.  She is s/p hip fracture repair on 11/8. Pharmacy was originally consulted for vancomycin and Zosyn dosing for sepsis/post-op fever.  Now consulted for levofloxacin dosing for CAP. Patient has has recent hospitalization for PNA and COPD exacerbation. MD requesting IV only Abx at this time.   Plan:  **spoke with patients family- they are unsure about patient's listed allergy to levofloxacin. They said she has a PCN allergy listed but received a PCN last week with no issues. They do not think this is a true allergy, but more likely an intolerance.**   Will start the patient on Levofloxacin 750mg  IV every 48 hours based on CrCl <31ml/min  Height: 4\' 11"  (149.9 cm) Weight: 99 lb 10.4 oz (45.2 kg) IBW/kg (Calculated) : 43.2  Temp (24hrs), Avg:98.8 F (37.1 C), Min:98.2 F (36.8 C), Max:99.6 F (37.6 C)   Recent Labs Lab 12/21/15 2054 12/22/15 0528 12/22/15 1048 12/22/15 1427 12/23/15 0310 12/23/15 1312 12/23/15 1528 12/24/15 0534 12/25/15 0358  WBC 10.4 11.7*  --   --  13.6*  --   --  9.2 8.2  CREATININE 0.64 0.71  --   --  0.63  --   --  0.60 0.64  LATICACIDVEN  --   --  1.4 2.4*  --  1.2 2.6*  --   --     Estimated Creatinine Clearance: 35.1 mL/min (by C-G formula based on SCr of 0.64 mg/dL).    Allergies  Allergen Reactions  . Macrobid [Nitrofurantoin Macrocrystal] Rash  . Azithromycin Other (See Comments)    unknown  . Doxycycline Other (See Comments)    unknown  . Escitalopram Oxalate Other (See Comments)    unknown  . Fioricet [Butalbital-Apap-Caffeine] Other (See Comments)    Drunk.  . Latex Other (See Comments)    unknown  . Levofloxacin Other (See Comments)    unknown  . Nsaids Other (See Comments)    This is not an allergy. The patient was told by Dr. Rockne Menghini to avoid NSAIDs and stop Vicoprofen in to 2014 to avoid nephrotoxicity.   Allergic  reaction not listed on MAR.  Marland Kitchen Oxycodone Other (See Comments)    reaction to synthetic codeine  . Restasis [Cyclosporine] Other (See Comments)    unknown  . Sulfa Antibiotics Other (See Comments)    Reaction unknown  . Biaxin [Clarithromycin] Rash    With burning sensation    Antimicrobials this admission: Vancomycin 11/10 >> 11/12 Zosyn 11/10 >> 11/12 Fluconazole 11/12>> Levofloxacin 11/12 >>  Dose adjustments this admission:   Microbiology results: 11/10 BCx: collected 11/9 UCx: NEG  11/7 MRSA PCR: NEG 10/29 Wound: Morganella morganii (R-amp, cefazolin, Bactrim; I-Unasyn)   Thank you for allowing pharmacy to be a part of this patient's care.  Nancy Fetter, PharmD Clinical Pharmacist Main pharmacy [phone] 12/25/2015 10:10 AM

## 2015-12-25 NOTE — Progress Notes (Signed)
Patient ID: Taylor Roth, female   DOB: 1929-01-01, 82 y.o.   MRN: [id number]   Subjective: 4 Days Post-Op Procedure(s) (LRB): ARTHROPLASTY BIPOLAR HIP (HEMIARTHROPLASTY) (Right)    Patient demented, no verbalization of any problems or concerns  Objective:   VITALS:   Vitals:   12/24/15 2035 12/25/15 0437  BP: 137/70 (!) 151/68  Pulse: 84 78  Resp: 17   Temp: 98.6 F (37 C) 99.6 F (37.6 C)    Incision: dressing C/D/I  LABS  Recent Labs  12/23/15 0310 12/24/15 0534 12/25/15 0358  HGB 8.8* 8.1* 8.4*  HCT 28.2* 25.8* 27.3*  WBC 13.6* 9.2 8.2  PLT 284 269 331     Recent Labs  12/23/15 0310 12/24/15 0534 12/25/15 0358  NA 142 144 140  K 4.0 3.5 3.1*  BUN 24* 19 15  CREATININE 0.63 0.60 0.64  GLUCOSE 93 94 86     Recent Labs  12/23/15 1312  INR 1.75     Assessment/Plan: 4 Days Post-Op Procedure(s) (LRB): ARTHROPLASTY BIPOLAR HIP (HEMIARTHROPLASTY) (Right)   Up with therapy Discharge to SNF  Plan per Stann Mainland, Orthopaedically discharge when medically appropriate

## 2015-12-26 LAB — CBC AND DIFFERENTIAL
HCT: 26 % — AB (ref 36–46)
Hemoglobin: 8.3 g/dL — AB (ref 12.0–16.0)
PLATELETS: 357 10*3/uL (ref 150–399)
WBC: 5.7 10^3/mL

## 2015-12-26 LAB — BASIC METABOLIC PANEL
ANION GAP: 10 (ref 5–15)
BUN: 8 mg/dL (ref 4–21)
BUN: 8 mg/dL (ref 6–20)
CALCIUM: 8.1 mg/dL — AB (ref 8.9–10.3)
CHLORIDE: 109 mmol/L (ref 101–111)
CO2: 22 mmol/L (ref 22–32)
CREATININE: 0.5 mg/dL (ref 0.5–1.1)
Creatinine, Ser: 0.49 mg/dL (ref 0.44–1.00)
GFR calc non Af Amer: >60 mL/min (ref >60)
Glucose, Bld: 98 mg/dL (ref 65–99)
Glucose: 98 mg/dL
POTASSIUM: 2.7 mmol/L — AB (ref 3.4–5.3)
POTASSIUM: 2.7 mmol/L — AB (ref 3.5–5.1)
SODIUM: 141 mmol/L (ref 137–147)
Sodium: 141 mmol/L (ref 135–145)

## 2015-12-26 LAB — CBC
HEMATOCRIT: 25.9 % — AB (ref 36.0–46.0)
HEMOGLOBIN: 8.3 g/dL — AB (ref 12.0–15.0)
MCH: 28.5 pg (ref 26.0–34.0)
MCHC: 32 g/dL (ref 30.0–36.0)
MCV: 89 fL (ref 78.0–100.0)
Platelets: 357 10*3/uL (ref 150–400)
RBC: 2.91 MIL/uL — ABNORMAL LOW (ref 3.87–5.11)
RDW: 13.7 % (ref 11.5–15.5)
WBC: 5.7 10*3/uL (ref 4.0–10.5)

## 2015-12-26 LAB — PROCALCITONIN: PROCALCITONIN: 0.51 ng/mL

## 2015-12-26 MED ORDER — POTASSIUM CHLORIDE CRYS ER 20 MEQ PO TBCR
30.0000 meq | EXTENDED_RELEASE_TABLET | Freq: Once | ORAL | Status: DC
  Filled 2015-12-26: qty 1

## 2015-12-26 MED ORDER — POTASSIUM CHLORIDE 20 MEQ/15ML (10%) PO SOLN
40.0000 meq | Freq: Once | ORAL | Status: AC
  Administered 2015-12-26: 40 meq via ORAL
  Filled 2015-12-26: qty 30

## 2015-12-26 MED ORDER — SODIUM CHLORIDE 0.9 % IV SOLN
Freq: Once | INTRAVENOUS | Status: AC
  Administered 2015-12-26: 10:00:00 via INTRAVENOUS
  Filled 2015-12-26: qty 1000

## 2015-12-26 NOTE — Progress Notes (Signed)
Speech Language Pathology Treatment: Dysphagia  Patient Details Name: Taylor Roth MRN: [id number] DOB: [birth year] Today's Date: 12/26/2015 Time: 8828-0034 SLP Time Calculation (min) (ACUTE ONLY): 34 min  Assessment / Plan / Recommendation Clinical Impression  Pt demonstrates ongoing lethargy, keeping eyes closed while daughter feeds her, though she does respond appropriately to tactile cues; closes mouth on spoon, sipping from straw. Intermittently pt is observed to cough immediately after sips, or also several minutes after sips. Hesitate to modify diet given that pt is likely to refuse foods and drinks if they are unpleasant. Will complete objective assessment tomorrow to determine need to diet modifications. Daughter in agreement.    HPI HPI: Taylor Roth is a 82 y.o. female with medical history significant of benzo/narcotics abuse, anxiety, depression, CKD, COPD, GERD, migraines, osteoporosis, scoliosis, substance abuse, vertigo admitted after fall.  Found to have a hip fx and plans are for surgery.  Pt has been admitted recently with pna per her daughter and has baseline esophageal dysphagia with prior food impaction per chart review.  Esophagram 01/18/2015 done showed poor primary esophageal stripping wave and tertiary contractions and a prominent kink of mid and distal thoracic esophagus resulting in lodged barium tablet that did not clear despite warm water ingestion over 10 minutes.  Per family within the last weeks, pt has needed someone to feed her at her facility.  Pt is npo currently and plan is for surgery the next date.       SLP Plan  Continue with current plan of care     Recommendations  Diet recommendations: Thin liquid;Regular (daughter chopping food) Liquids provided via: Straw Medication Administration: Crushed with puree Supervision: Full supervision/cueing for compensatory strategies Compensations: Minimize environmental distractions;Slow rate;Small  sips/bites;Follow solids with liquid Postural Changes and/or Swallow Maneuvers: Seated upright 90 degrees                Oral Care Recommendations: Oral care BID Follow up Recommendations: Skilled Nursing facility Plan: Continue with current plan of care       [place] Betsy Rosello, MA CCC-SLP [phone]  Lynann Beaver 12/26/2015, 2:02 PM

## 2015-12-26 NOTE — Progress Notes (Signed)
PROGRESS NOTE                                                                                                                                                      Patient Demographics:    Taylor Roth, is a 82 y.o. female, DOB - [birth year], EKC:[id number]  Admit date - 12/19/2015   Admitting Physician MAGICK-MYERS, ISKRA  LOS - 7  Brief Narrative   82 year old female with bipolar disorder, mild dementia, hypertension, benzos and narcotics use, CKD stage IV, COPD, GERD, depression who was recently hospitalized for pneumonia and COPD exacerbation and discharged to skilled nursing facility. Patient was participating with physical therapy at the facility but reportedly had an unwitnessed fall 2 days prior to admission. Since then she was not participating with PT and remained in bed. When daughter went to visit her on the day of admission she was found to be very confused and lethargic.   Subjective:   Patient is more alert this AM but still rather sleepy. She is able to answer questions and follow commands.    Assessment  & Plan :   Principal Problem: Closed right hip fracture, initial encounter ([place]) - Suspect secondary to mechanical fall. - s/p open treatment of femoral fracture, proximal end, neck, prosthetic replacement, postop day #4 - ortho team following, appreciate assistance  - up with therapy but this has been challenging as pt still sleepy for most of the day  Active Problems: Acute metabolic encephalopathy - recent pneumonia, hospitalization and use of narcotics. - more alert this AM but still with poor oral intake   Post op fever up to 102.2 F 11/9 - 11/10 - pt met criteria for sepsis, source RLL PNA, aspiration PNA, unknown pathogen  - aspiration PNA confirmed with CT chest - no pulmonary emboli noted  - fever curve trending down - keep on ABX day #4/7, on Levaquin   Oral Thrush - continue Diflucan day  #2 and mouth spray   Hypernatremia, hypokalemia  - low NA suspect from pre renal etiology - resolved with IVF but oral intake is minimal  - supplement K via IV - BMP in AM  COPD (chronic obstructive pulmonary disease) (HCC) - maintaining oxygen saturations at target range  Protein-calorie malnutrition, severe ([place]) - Nutritionist consulted. - advanced diet  - SLP eval as pt at risk for aspiration   CKD (chronic kidney disease), stage III - Renal function stable. Continue to monitor. - BMP in AM  Decubitus ulcer of hip, stage 3 ([place]) - Seen by wound care  - Recommend mattress replacement for pressure redistribution, prevalon boots for heel pressure injury protection.  Depression/bipolar disorder - continue home medications  Code Status : DO NOT RESUSCITATE  Family Communication  : daughter at bedside   Disposition Plan  : Needs SNF  Consults  : Dr. Lorre Nick (orthopedics)  Procedures  : Hip surgery   DVT Prophylaxis  : Lovenox SQ  Lab Results  Component Value Date   PLT 357 12/26/2015    Antibiotics  :  Pre op  Anti-infectives    Start     Dose/Rate Route Frequency Ordered Stop   12/25/15 1200  levofloxacin (LEVAQUIN) IVPB 750 mg     750 mg 100 mL/hr over 90 Minutes Intravenous Every 48 hours 12/25/15 1018     12/25/15 1000  fluconazole (DIFLUCAN) IVPB 100 mg     100 mg 50 mL/hr over 60 Minutes Intravenous Every 24 hours 12/25/15 0904     12/24/15 1330  vancomycin (VANCOCIN) IVPB 750 mg/150 ml premix  Status:  Discontinued     750 mg 150 mL/hr over 60 Minutes Intravenous Every 24 hours 12/23/15 1403 12/25/15 0904   12/23/15 2000  piperacillin-tazobactam (ZOSYN) IVPB 3.375 g  Status:  Discontinued     3.375 g 12.5 mL/hr over 240 Minutes Intravenous Every 8 hours 12/23/15 1403 12/25/15 0904   12/23/15 1245  piperacillin-tazobactam (ZOSYN) IVPB 3.375 g     3.375 g 100 mL/hr over 30 Minutes Intravenous  Once 12/23/15 1234 12/23/15 1411   12/23/15 1245  vancomycin  (VANCOCIN) IVPB 1000 mg/200 mL premix     1,000 mg 200 mL/hr over 60 Minutes Intravenous  Once 12/23/15 1234 12/23/15 1441   12/21/15 2015  ceFAZolin (ANCEF) IVPB 1 g/50 mL premix     1 g 100 mL/hr over 30 Minutes Intravenous Every 6 hours 12/21/15 2003 12/22/15 1059   12/21/15 1330  ceFAZolin (ANCEF) IVPB 2g/100 mL premix     2 g 200 mL/hr over 30 Minutes Intravenous To [facility] 12/20/15 2217 12/21/15 1414        Objective:   Vitals:   12/25/15 1326 12/25/15 2052 12/26/15 0548 12/26/15 0832  BP: 131/68 (!) 149/65 (!) 155/71 (!) 157/75  Pulse: 74 81 80 80  Resp: _0 Temp: 97.9 F (36.6 C) 98.1 F (36.7 C) 98.2 F (36.8 C)   TempSrc: Axillary Oral Oral   SpO2: 98% 95% 95% 96%  Weight:      Height:        Wt Readings from Last 3 Encounters:  12/19/15 45.2 kg (99 lb 10.4 oz)  12/19/15 45.4 kg (100 lb)  12/16/15 46.3 kg (102 lb 1.2 oz)     Intake/Output Summary (Last 24 hours) at 12/26/15 1006 Last data filed at 12/26/15 0556  Gross per 24 hour  Intake              670 ml  Output             2002 ml  Net            -1332 ml   Physical Exam Gen: more alert but rather slow, able to answer questions and follow most of the commands  HEENT: no pallor, dry MM, supple neck, temporal wasting Chest: diminished breath sounds at bases  CVS: N S1&S2, no murmurs, rubs or gallop GI: soft, NT, ND, BS+, Foley in place    Data Review:   CBC  Recent Labs Lab 12/19/15 1717  12/22/15 0528 12/23/15 0310 12/24/15 0534 12/25/15 0358 12/26/15 0416  WBC 7.8  < > 11.7*  13.6* 9.2 8.2 5.7  HGB 10.9*  < > 10.2* 8.8* 8.1* 8.4* 8.3*  HCT 35.2*  < > 33.1* 28.2* 25.8* 27.3* 25.9*  PLT 285  < > 254 284 269 331 357  MCV 93.9  < > 90.7 91.6 90.8 91.0 89.0  MCH 29.1  < > 27.9 28.6 28.5 28.0 28.5  MCHC 31.0  < > 30.8 31.2 31.4 30.8 32.0  RDW 13.9  < > 13.3 13.6 13.7 14.0 13.7  LYMPHSABS 1.4  --   --   --   --   --   --   MONOABS 1.0  --   --   --   --   --   --   EOSABS  0.1  --   --   --   --   --   --   BASOSABS 0.0  --   --   --   --   --   --   < > = values in this interval not displayed.  Chemistries   Recent Labs Lab 12/19/15 1717  12/22/15 0528 12/23/15 0310 12/24/15 0534 12/25/15 0358 12/26/15 0416  NA 144  < > 140 142 144 140 141  K 3.9  < > 3.4* 4.0 3.5 3.1* 2.7*  CL 108  < > 104 111 114* 110 109  CO2 29  < > 25 21* 22 19* 22  GLUCOSE 109*  < > 126* 93 94 86 98  BUN 29*  < > 19 24* _0 CREATININE 0.73  < > 0.71 0.63 0.60 0.64 0.49  CALCIUM 8.9  < > 8.6* 8.4* 8.4* 8.2* 8.1*  AST 61*  --   --   --   --   --   --   ALT 46  --   --   --   --   --   --   ALKPHOS 52  --   --   --   --   --   --   BILITOT 0.6  --   --   --   --   --   --   < > = values in this interval not displayed. Coagulation profile  Recent Labs Lab 12/19/15 1717 12/23/15 1312  INR 1.16 1.75   Inpatient Medications  Scheduled Meds: . collagenase   Topical Daily  . docusate sodium  100 mg Oral BID  . dorzolamide-timolol  1 drop Both Eyes BID  . enoxaparin (LOVENOX) injection  30 mg Subcutaneous Q24H  . famotidine  10 mg Oral BID  . feeding supplement (ENSURE ENLIVE)  237 mL Oral BID WC  . fluconazole (DIFLUCAN) IV  100 mg Intravenous Q24H  . fludrocortisone  0.1 mg Oral q morning - 10a  . latanoprost  1 drop Both Eyes QHS  . levofloxacin (LEVAQUIN) IV  750 mg Intravenous Q48H  . metoprolol tartrate  25 mg Oral BID  . pantoprazole  40 mg Oral Q0600  . polyethylene glycol  17 g Oral Daily  . 0.9 % sodium chloride with kcl   Intravenous Once  . valproate sodium  500 mg Intravenous Daily   Continuous Infusions: . 0.9 % NaCl with KCl 40 mEq / L 50 mL/hr (12/25/15 0830)   PRN Meds:.acetaminophen **OR** acetaminophen, acetaminophen, albuterol, magic mouthwash, ondansetron **OR** ondansetron (ZOFRAN) IV, phenol, polyethylene glycol, senna, traMADol  Micro Results Recent Results (from the past 240 hour(s))  Urine culture     Status: None   Collection  Time: 12/19/15 11:11 PM  Result Value Ref Range Status   Specimen Description URINE, RANDOM  Final   Special Requests NONE  Final   Culture NO GROWTH Performed at [facility]   Final   Report Status 12/21/2015 FINAL  Final  Surgical pcr screen     Status: None   Collection Time: 12/20/15 11:32 PM  Result Value Ref Range Status   MRSA, PCR NEGATIVE NEGATIVE Final   Staphylococcus aureus NEGATIVE NEGATIVE Final    Comment:        The Xpert SA Assay (FDA approved for NASAL specimens in patients over 7 years of age), is one component of a comprehensive surveillance program.  Test performance has been validated by [facility] for patients greater than or equal to 12 year old. It is not intended to diagnose infection nor to guide or monitor treatment.   Culture, Urine     Status: None   Collection Time: 12/22/15  3:55 PM  Result Value Ref Range Status   Specimen Description URINE, CLEAN CATCH  Final   Special Requests NONE  Final   Culture NO GROWTH  Final   Report Status 12/23/2015 FINAL  Final  Culture, blood (x 2)     Status: None (Preliminary result)   Collection Time: 12/23/15  1:12 PM  Result Value Ref Range Status   Specimen Description BLOOD BLOOD RIGHT ARM  Final   Special Requests BOTTLES DRAWN AEROBIC ONLY 6CC  Final   Culture NO GROWTH 2 DAYS  Final   Report Status PENDING  Incomplete  Culture, blood (x 2)     Status: None (Preliminary result)   Collection Time: 12/23/15  1:17 PM  Result Value Ref Range Status   Specimen Description BLOOD BLOOD RIGHT HAND  Final   Special Requests IN PEDIATRIC BOTTLE 2CC  Final   Culture NO GROWTH 2 DAYS  Final   Report Status PENDING  Incomplete    Radiology Reports Dg Chest 1 View  Result Date: 12/19/2015 CLINICAL DATA:  Hip fracture, fall EXAM: CHEST 1 VIEW COMPARISON:  12/15/2015 FINDINGS: Semi-erect view of the chest demonstrates mild hazy bibasilar opacity, probable atelectasis. No large effusion.  Cardiomediastinal silhouette stable with atherosclerosis. No pneumothorax. Skin fold artifact over the left chest. IMPRESSION: Hazy bibasilar atelectasis. Atherosclerosis of the aorta Electronically Signed   By: Donavan Foil M.D.   On: 12/19/2015 18:11   Ct Head Wo Contrast  Result Date: 12/11/2015 CLINICAL DATA:  Altered mental status. EXAM: CT HEAD WITHOUT CONTRAST TECHNIQUE: Contiguous axial images were obtained from the base of the skull through the vertex without intravenous contrast. COMPARISON:  CT scan of November 23, 2015. FINDINGS: Brain: Mild chronic ischemic white matter disease is noted. No mass effect or midline shift is noted. Ventricular size is within normal limits. There is no evidence of mass lesion, hemorrhage or acute infarction. Vascular: Atherosclerosis of carotid siphons is noted. Skull: Bony calvarium appears intact. Sinuses/Orbits: Paranasal sinuses appear normal. Other: None. IMPRESSION: Mild chronic ischemic white matter disease. No acute intracranial abnormality seen. Electronically Signed   By: Marijo Conception, M.D.   On: 12/11/2015 20:09   Ct Angio Chest Pe W Or Wo Contrast  Result Date: 12/23/2015 CLINICAL DATA:  F/U on fever. H/O dysphagia, COPD, pneumonia. Pt was very feisty, kinked up for scan. Pt tried to pull iv in the middle of scan causing extravasation with the kinky posture. Scan repeated and with new iv. 70 ML ISOVUE 370 and 60 .*comment  was truncated* EXAM: CT ANGIOGRAPHY CHEST WITH CONTRAST TECHNIQUE: Multidetector CT imaging of the chest was performed using the standard protocol during bolus administration of intravenous contrast. Multiplanar CT image reconstructions and MIPs were obtained to evaluate the vascular anatomy. CONTRAST:  130 cc Isovue COMPARISON:  None FINDINGS: Cardiovascular: No filling defects within pulmonary to suggest acute pulmonary embolism. No pericardial fluid. No acute findings aorta great vessels. Mediastinum/Nodes: No axillary or  supraclavicular lymphadenopathy. No mediastinal hilar lymphadenopathy. No pericardial fluid. Lungs/Pleura: Bilateral small effusions. There is mild airspace disease in the RIGHT lower lobe. Centrilobular emphysema in the upper lobes. Upper Abdomen: Limited view of the liver, kidneys, pancreas are unremarkable. Normal adrenal glands. Musculoskeletal: Kyphoscoliosis.  No acute findings Review of the MIP images confirms the above findings. IMPRESSION: 1. No evidence acute pulmonary embolism. 2. Small bilateral pleural effusions and mild interstitial edema. 3. Mild airspace disease in the RIGHT lower lobe. Differential includes aspiration pneumonitis, pneumonia, or asymmetric edema. Electronically Signed   By: Suzy Bouchard M.D.   On: 12/23/2015 20:33   Dg Pelvis Portable  Result Date: 12/21/2015 CLINICAL DATA:  Right hip replacement EXAM: PORTABLE PELVIS 1-2 VIEWS COMPARISON:  12/19/2015 FINDINGS: AP portable supine view of the pelvis. Only the lower pelvis is included. The patient is status post right hip replacement with satisfactory alignment. Soft tissue staples laterally. Small amount of soft tissue gas, postoperative. IMPRESSION: A interim right hip replacement with satisfactory alignment. Electronically Signed   By: Donavan Foil M.D.   On: 12/21/2015 20:11   Dg Chest Port 1 View  Result Date: 12/22/2015 CLINICAL DATA:  Fevers EXAM: PORTABLE CHEST 1 VIEW COMPARISON:  12/19/2015 FINDINGS: Increasing right-sided pleural effusion is noted. The cardiac shadow is stable. No focal infiltrate is seen. No bony abnormality is noted. IMPRESSION: Slight increase in right-sided pleural effusion. No other focal abnormality is noted. Electronically Signed   By: Inez Catalina M.D.   On: 12/22/2015 14:01   Dg Chest Port 1 View  Result Date: 12/15/2015 CLINICAL DATA:  82 year old female with pneumonia. Subsequent encounter. EXAM: PORTABLE CHEST 1 VIEW COMPARISON:  12/11/2015, 08/12/2015 and 01/26/2015. FINDINGS:  Slightly asymmetric airspace disease greater on the right which may represent a mild pulmonary edema. No segmental consolidation. Prominent skin fold on left without pneumothorax. Calcified tortuous aorta. Heart size within normal limits. No plain film evidence of pulmonary malignancy. Shoulder joint degenerative changes and scoliosis thoracic spine. IMPRESSION: Findings suggestive of mild pulmonary edema. Electronically Signed   By: Genia Del M.D.   On: 12/15/2015 13:08   Dg Chest Port 1 View  Result Date: 12/11/2015 CLINICAL DATA:  Back pain. EXAM: PORTABLE CHEST 1 VIEW COMPARISON:  August 12, 2015 FINDINGS: The heart size borderline. The hila and mediastinum are unchanged. No pulmonary nodules or masses. Mild interstitial prominence in the right chest, new in the interval. No other acute abnormalities. IMPRESSION: Mild interstitial prominence in the right lung, asymmetric to the left could represent asymmetric edema or an atypical infection. Recommend clinical correlation and follow-up to resolution. Electronically Signed   By: Dorise Bullion III M.D   On: 12/11/2015 16:16   Dg Hip Unilat With Pelvis 2-3 Views Right  Result Date: 12/19/2015 CLINICAL DATA:  Fall at nursing facility EXAM: DG HIP (WITH OR WITHOUT PELVIS) 2-3V RIGHT COMPARISON:  01/27/2015 FINDINGS: The left hip demonstrates normal positioning. The pubic symphysis is intact. There is a mildly comminuted right femoral neck fracture with mild superior migration of the distal femur. The right femoral head  remains seated within the acetabulum. There is possible underlying lucency within the superolateral femoral neck. There is mild varus angulation. IMPRESSION: 1. Mildly comminuted, displaced and angulated right femoral neck fracture. Possible lucency within the underlying superolateral femoral neck, pathologic fracture cannot be excluded. 2. The left hip is within normal limits Electronically Signed   By: Donavan Foil M.D.   On: 12/19/2015  18:09    Time Spent in minutes  25  Faye Ramsay M.D on 12/26/2015 at 10:06 AM  Between 7am to 7pm - Pager - [phone]  After 7pm go to [email or web address] - password [facility]  [name] -  Office  [phone]

## 2015-12-26 NOTE — Progress Notes (Signed)
Stopped by to visit with pt and family in rm. Latter concentrating on feeding pt, however, so will return another time.   12/26/15 1600  Clinical Encounter Type  Visited With Patient and family together  Visit Type Initial  Referral From Chaplain  Spiritual Encounters  Spiritual Needs Emotional  Stress Factors  Patient Stress Factors Health changes;Loss of control  Family Stress Factors Family relationships;Health changes

## 2015-12-26 NOTE — Clinical Social Work Note (Signed)
Per MD patient to remain inpatient.  Patient continues to be lethargic and is currently not at baseline.  CSW updated SNF.  Disposition: [facility] (return)  Nonnie Done, MSW, LCSW  [phone]  Licensed Clinical Social Worker

## 2015-12-26 NOTE — Progress Notes (Signed)
Physical Therapy Treatment Patient Details Name: Taylor Roth MRN: [id number] DOB: 04-03-1928 Today's Date: 12/26/2015    History of Present Illness Pt is a 82 y.o. female with medical history significant of benzo/narcotics abuse, anxiety, depression, CKD, COPD, GERD, migraines, osteoporosis, scoliosis, substance abuse, vertigo admitted after fall.  Pt s/p R THA with posterior precautions    PT Comments    Max/total A +2 for all mobility. Patient continues to be lethargic and follows commands inconsistently. Current plan remains appropriate.   Follow Up Recommendations  SNF;Supervision/Assistance - 24 hour     Equipment Recommendations  None recommended by PT    Recommendations for Other Services       Precautions / Restrictions Precautions Precautions: Posterior Hip;Fall Restrictions Weight Bearing Restrictions: Yes RLE Weight Bearing: Weight bearing as tolerated    Mobility  Bed Mobility Overal bed mobility: Needs Assistance;+2 for physical assistance Bed Mobility: Supine to Sit     Supine to sit: Max assist;+2 for physical assistance     General bed mobility comments: assist for all aspects of bed mobility; use of rails with max multimodal cues for sequencing/technique  Transfers Overall transfer level: Needs assistance   Transfers: Sit to/from Stand;Stand Pivot Transfers Sit to Stand: Max assist;+2 physical assistance Stand pivot transfers: Total assist;+2 physical assistance       General transfer comment: max A +2 to stand and total A +2 to stadn pivot with use of bed pad; max multimodal cues for safe hand placement and technique with hand over hand needed for hand placement and use of RW as pt grasped onto objects and would not let go with vc  Ambulation/Gait                 Stairs            Wheelchair Mobility    Modified Rankin (Stroke Patients Only)       Balance Overall balance assessment: Needs assistance   Sitting  balance-Leahy Scale: Poor       Standing balance-Leahy Scale: Zero                      Cognition Arousal/Alertness: Lethargic;Suspect due to medications Behavior During Therapy: Flat affect Overall Cognitive Status: Difficult to assess Area of Impairment: Following commands       Following Commands: Follows one step commands inconsistently;Follows one step commands with increased time            Exercises      General Comments        Pertinent Vitals/Pain Pain Assessment: No/denies pain    Home Living                      Prior Function            PT Goals (current goals can now be found in the care plan section) Acute Rehab PT Goals Patient Stated Goal: none stated Progress towards PT goals: Not progressing toward goals - comment    Frequency    Min 3X/week      PT Plan Current plan remains appropriate    Co-evaluation             End of Session Equipment Utilized During Treatment: Gait belt Activity Tolerance: Patient limited by lethargy Patient left: in chair;with call bell/phone within reach;with chair alarm set     Time: 1138-1200 PT Time Calculation (min) (ACUTE ONLY): 22 min  Charges:  $Therapeutic Activity: 8-22 mins  G Codes:      Salina April, PTA Pager: [phone]   12/26/2015, 3:17 PM

## 2015-12-26 NOTE — Progress Notes (Signed)
   Subjective:  Patient is very somnolent.  Objective:   VITALS:   Vitals:   12/25/15 2052 12/26/15 0548 12/26/15 0832 12/26/15 1500  BP: (!) 149/65 (!) 155/71 (!) 157/75 (!) 160/76  Pulse: 81 80 80 89  Resp: 16  17   Temp: 98.1 F (36.7 C) 98.2 F (36.8 C)  98.7 F (37.1 C)  TempSrc: Oral Oral  Oral  SpO2: 95% 95% 96% 97%  Weight:      Height:        Neurologically intact responds to stimulation in the right foot Not following commands Dressing is c/d/i,   Lab Results  Component Value Date   WBC 5.7 12/26/2015   HGB 8.3 (L) 12/26/2015   HCT 25.9 (L) 12/26/2015   MCV 89.0 12/26/2015   PLT 357 12/26/2015   BMET    Component Value Date/Time   NA 141 12/26/2015 0416   NA 142 12/15/2015   K 2.7 (LL) 12/26/2015 0416   CL 109 12/26/2015 0416   CO2 22 12/26/2015 0416   GLUCOSE 98 12/26/2015 0416   BUN 8 12/26/2015 0416   BUN 15 12/15/2015   CREATININE 0.49 12/26/2015 0416   CREATININE 0.85 11/09/2013 1135   CALCIUM 8.1 (L) 12/26/2015 0416   GFRNONAA >60 12/26/2015 0416   GFRNONAA 64 11/09/2013 1135   GFRAA >60 12/26/2015 0416   GFRAA 74 11/09/2013 1135     Assessment/Plan: 5 Days Post-Op   Principal Problem:   Closed right hip fracture, initial encounter ([place]) Active Problems:   COPD (chronic obstructive pulmonary disease) ([place])   Osteoporosis   Glaucoma   Protein-calorie malnutrition, severe ([place])   CKD (chronic kidney disease), stage III   Encephalopathy   Decubitus ulcer of hip, stage 3 ([place])    Up with therapy PT/OT WBAT RLE Posterior hip precautions Will need follow up with Dr. Stann Mainland around 2 weeks from surgery for wound check and staple removal.   [name] 12/26/2015, 4:04 PM   Geralynn Rile, MD [phone]

## 2015-12-26 NOTE — Progress Notes (Signed)
CRITICAL VALUE ALERT  Critical value received:  K 2.7  Date of notification:  11/13  Time of notification:  6:22 am  Critical value read back: YES  Nurse who received alert:  Y. Ranisha Allaire  MD notified (1st page):  K. Schorr  Time of first page:  6:23 am  Responding MD: Lamar Blinks  Time MD responded: 6:32 am

## 2015-12-27 ENCOUNTER — Inpatient Hospital Stay (HOSPITAL_COMMUNITY): Payer: Medicare Other

## 2015-12-27 LAB — BASIC METABOLIC PANEL
ANION GAP: 9 (ref 5–15)
BUN: 6 mg/dL (ref 4–21)
BUN: 6 mg/dL (ref 6–20)
CO2: 23 mmol/L (ref 22–32)
Calcium: 8.4 mg/dL — ABNORMAL LOW (ref 8.9–10.3)
Chloride: 110 mmol/L (ref 101–111)
Creatinine, Ser: 0.5 mg/dL (ref 0.44–1.00)
Creatinine: 0.5 mg/dL (ref 0.5–1.1)
GFR calc Af Amer: >60 mL/min (ref >60)
GLUCOSE: 106 mg/dL
GLUCOSE: 106 mg/dL — AB (ref 65–99)
POTASSIUM: 3.5 mmol/L (ref 3.5–5.1)
Potassium: 3.5 mmol/L (ref 3.4–5.3)
Sodium: 142 mmol/L (ref 135–145)
Sodium: 142 mmol/L (ref 137–147)

## 2015-12-27 LAB — CBC
HEMATOCRIT: 27.1 % — AB (ref 36.0–46.0)
HEMOGLOBIN: 8.4 g/dL — AB (ref 12.0–15.0)
MCH: 27.5 pg (ref 26.0–34.0)
MCHC: 31 g/dL (ref 30.0–36.0)
MCV: 88.6 fL (ref 78.0–100.0)
Platelets: 406 10*3/uL — ABNORMAL HIGH (ref 150–400)
RBC: 3.06 MIL/uL — ABNORMAL LOW (ref 3.87–5.11)
RDW: 13.8 % (ref 11.5–15.5)
WBC: 5.7 10*3/uL (ref 4.0–10.5)

## 2015-12-27 LAB — CBC AND DIFFERENTIAL: WBC: 5.7 10*3/mL

## 2015-12-27 MED ORDER — ENSURE ENLIVE PO LIQD
237.0000 mL | Freq: Three times a day (TID) | ORAL | Status: DC
  Administered 2015-12-27 – 2015-12-31 (×12): 237 mL via ORAL

## 2015-12-27 NOTE — Progress Notes (Signed)
Nutrition Follow-up  DOCUMENTATION CODES:   Severe malnutrition in context of acute illness/injury  INTERVENTION:  Provide Ensure Enlive po TID, each supplement provides 350 kcal and 20 grams of protein.  Encourage adequate PO intake.   NUTRITION DIAGNOSIS:   Inadequate oral intake related to acute illness, poor appetite, lethargy/confusion as evidenced by per patient/family report, percent weight loss; ongoing  GOAL:   Patient will meet greater than or equal to 90% of their needs; progressing  MONITOR:   PO intake, Supplement acceptance, Labs, Weight trends, Skin, I & O's  REASON FOR ASSESSMENT:   Consult, Malnutrition Screening Tool, Low [place] Assessment of nutrition requirement/status  ASSESSMENT:   82 year old female with bipolar disorder, question mild dementia, hypertension, benzos and narcotics use, CKD stage IV, COPD, GERD, depression who was recently hospitalized for pneumonia and COPD exacerbation and discharged to skilled nursing facility. Patient was participating with physical therapy at the facility but reportedly had an unwitnessed fall 2 days prior to admission. Since then she was not participating with PT and remained in bed. When daughter went to visit her on the day of admission she was found to be very confused and lethargic.  Operative Procedures (11/8): Open treatment of femoral fracture, proximal end, neck, prosthetic replacement  Meal completion has been varied from 25-50%. Family at bedside and reports she has been mostly feeding patient at meals and has been encouraging pt to eat more at meals. Pt currently has Ensure ordered and has been consuming them. RD to increase orders to TID to aid in caloric and protein needs.   Labs and medications reviewed.   Diet Order:  Diet regular Room service appropriate? Yes; Fluid consistency: Thin  Skin:  Wound (see comment) (Unstageable L hip, incision R hip)  Last BM:  11/14  Height:   Ht Readings from Last  1 Encounters:  12/19/15 4\' 11"  (1.499 m)    Weight:   Wt Readings from Last 1 Encounters:  12/19/15 99 lb 10.4 oz (45.2 kg)    Ideal Body Weight:  45.45 kg  BMI:  Body mass index is 20.13 kg/m.  Estimated Nutritional Needs:   Kcal:  1300-1500  Protein:  55-75 grams  Fluid:  >/= 1.5 L/day  EDUCATION NEEDS:   No education needs identified at this time  Corrin Parker, MS, RD, LDN Pager # [phone] After hours/ weekend pager # [phone]

## 2015-12-27 NOTE — Progress Notes (Signed)
On assessment this am, pt awake and stating she feels "smothered." Her O2 sats on RA are 94%, she does not appear to be dyspneic and respirations are 20. 2L [place] was applied for comfort, and albuterol tx given. During tx, pt reports she feels better now. Her daughter said she complained of the same thing yesterday. Dr. Doyle Askew made aware, no orders received.  Jet, Jerry Caras

## 2015-12-27 NOTE — Care Management Important Message (Signed)
Important Message  Patient Details  Name: Taylor Roth MRN: [id number] Date of Birth: 12-05-1928   Medicare Important Message Given:  Yes    Terita Hejl Montine [address] 12/27/2015, 10:43 AM

## 2015-12-27 NOTE — Progress Notes (Addendum)
PROGRESS NOTE                                                                                                                                                      Patient Demographics:    Taylor Roth, is a 82 y.o. female, DOB - Jun 22, 1928, VXY:[id number]  Admit date - 12/19/2015   Admitting Physician MAGICK-Chad Donoghue  LOS - 8  Brief Narrative   82 year old female with bipolar disorder, mild dementia, hypertension, benzos and narcotics use, CKD stage IV, COPD, GERD, depression who was recently hospitalized for pneumonia and COPD exacerbation and discharged to skilled nursing facility. Patient was participating with physical therapy at the facility but reportedly had an unwitnessed fall 2 days prior to admission. Since then she was not participating with PT and remained in bed. When daughter went to visit her on the day of admission she was found to be very confused and lethargic.   Subjective:   Patient is more alert this AM but still rather sleepy. She is able to answer some questions but confusion quite significant.    Assessment  & Plan :   Principal Problem: Closed right hip fracture, initial encounter ([place]) - Suspect secondary to mechanical fall. - s/p open treatment of femoral fracture, proximal end, neck, prosthetic replacement, postop day #5 - ortho team following, appreciate assistance  - up with therapy but this has been challenging as pt still sleepy for most of the day - there is significant confusion ongoing and limiting better progress, daughter at bedside explains that pt has had hard time recovering in the past from anesthesia and in the past took her over 4 weeks to get back to her baseline  - will continue to encourage PT and minimize narcotic medications - pt is ready for discharge to SNF when her oral intake improves and when she is more alert   Active Problems: Acute metabolic encephalopathy -  recent pneumonia, hospitalization and use of narcotics. - more alert this AM but still with significant confusion as noted above - per RN, minimal use of pain medications - as noted above, this has been a challenge and we may not see significant improvement in her mentation any time soon - for now encourage PT, ambulation, OOB to chair - encouraged oral intake  Post op fever up to 102.2 F 11/9 - 11/10 - pt met criteria for sepsis, source RLL PNA, aspiration PNA, unknown pathogen  - aspiration PNA confirmed with CT chest - no pulmonary emboli noted  - fever curve trending down - keep on ABX day #5/7, on Levaquin  - suspect we can change to PO ABX  in AM if pt eating and tolerating PO  Oral Thrush - continue Diflucan day #3/7 and mouth spray   Hypernatremia, hypokalemia  - low Na suspect from pre renal etiology - resolved with IVF but oral intake is minimal  - supplemented K via IV and currently WNL this AM   COPD (chronic obstructive pulmonary disease) (HCC) - maintaining oxygen saturations at target range  Protein-calorie malnutrition, severe ([name]) - Nutritionist consulted. - advanced diet  - SLP eval as pt at risk for aspiration, Full supervision/cueing for compensatory strategies encouraged   CKD (chronic kidney disease), stage III - Renal function stable.   Decubitus ulcer of hip, stage 3 (HCC) - Seen by wound care  - Recommend mattress replacement for pressure redistribution, prevalon boots for heel pressure injury protection.  Depression/bipolar disorder - continue home medications, Depakote changed to IV as pt was not able to swallow the pill - SLP done and I think if pt alert enough, she could take PO, reassess in AM  Code Status : DO NOT RESUSCITATE  Family Communication  : daughter at bedside   Disposition Plan  : Needs SNF  Consults  : Dr. Lorre Nick (orthopedics)  Procedures  : Hip surgery   DVT Prophylaxis  : Lovenox SQ  Lab Results  Component Value Date    PLT 406 (H) 12/27/2015    Antibiotics  :   Anti-infectives    Start     Dose/Rate Route Frequency Ordered Stop   12/25/15 1200  levofloxacin (LEVAQUIN) IVPB 750 mg     750 mg 100 mL/hr over 90 Minutes Intravenous Every 48 hours 12/25/15 1018     12/25/15 1000  fluconazole (DIFLUCAN) IVPB 100 mg     100 mg 50 mL/hr over 60 Minutes Intravenous Every 24 hours 12/25/15 0904     12/24/15 1330  vancomycin (VANCOCIN) IVPB 750 mg/150 ml premix  Status:  Discontinued     750 mg 150 mL/hr over 60 Minutes Intravenous Every 24 hours 12/23/15 1403 12/25/15 0904   12/23/15 2000  piperacillin-tazobactam (ZOSYN) IVPB 3.375 g  Status:  Discontinued     3.375 g 12.5 mL/hr over 240 Minutes Intravenous Every 8 hours 12/23/15 1403 12/25/15 0904   12/23/15 1245  piperacillin-tazobactam (ZOSYN) IVPB 3.375 g     3.375 g 100 mL/hr over 30 Minutes Intravenous  Once 12/23/15 1234 12/23/15 1411   12/23/15 1245  vancomycin (VANCOCIN) IVPB 1000 mg/200 mL premix     1,000 mg 200 mL/hr over 60 Minutes Intravenous  Once 12/23/15 1234 12/23/15 1441   12/21/15 2015  ceFAZolin (ANCEF) IVPB 1 g/50 mL premix     1 g 100 mL/hr over 30 Minutes Intravenous Every 6 hours 12/21/15 2003 12/22/15 1059   12/21/15 1330  ceFAZolin (ANCEF) IVPB 2g/100 mL premix     2 g 200 mL/hr over 30 Minutes Intravenous To [facility] 12/20/15 2217 12/21/15 1414        Objective:   Vitals:   12/26/15 1500 12/26/15 2106 12/27/15 0601 12/27/15 0817  BP: (!) 160/76 (!) 158/97 (!) 156/70 (!) 151/65  Pulse: 89 94 78 74  Resp:  _0 Temp: 98.7 F (37.1 C) 100.2 F (37.9 C) 98.5 F (36.9 C)   TempSrc: Oral Oral Oral   SpO2: 97% 93% 94% 94%  Weight:      Height:        Wt Readings from Last 3 Encounters:  12/19/15 45.2 kg (99 lb 10.4 oz)  12/19/15 45.4 kg (  100 lb)  12/16/15 46.3 kg (102 lb 1.2 oz)     Intake/Output Summary (Last 24 hours) at 12/27/15 1009 Last data filed at 12/27/15 0900  Gross per 24 hour  Intake           3291.67 ml  Output             2175 ml  Net          1116.67 ml   Physical Exam Gen: more alert but rather slow, able to answer questions but rather confused, pulling sheets off of her  HEENT: no pallor, dry MM, supple neck, temporal wasting Chest: diminished breath sounds at bases  CVS: N S1&S2, no murmurs, rubs or gallop GI: soft, NT, ND, BS+, Foley in place    Data Review:   CBC  Recent Labs Lab 12/23/15 0310 12/24/15 0534 12/25/15 0358 12/26/15 0416 12/27/15 0346  WBC 13.6* 9.2 8.2 5.7 5.7  HGB 8.8* 8.1* 8.4* 8.3* 8.4*  HCT 28.2* 25.8* 27.3* 25.9* 27.1*  PLT 284 269 331 357 406*  MCV 91.6 90.8 91.0 89.0 88.6  MCH 28.6 28.5 28.0 28.5 27.5  MCHC 31.2 31.4 30.8 32.0 31.0  RDW 13.6 13.7 14.0 13.7 13.8    Chemistries   Recent Labs Lab 12/23/15 0310 12/24/15 0534 12/25/15 0358 12/26/15 0416 12/27/15 0346  NA 142 144 140 141 142  K 4.0 3.5 3.1* 2.7* 3.5  CL 111 114* 110 109 110  CO2 21* 22 19* 22 23  GLUCOSE 93 94 86 98 106*  BUN 24* _0 CREATININE 0.63 0.60 0.64 0.49 0.50  CALCIUM 8.4* 8.4* 8.2* 8.1* 8.4*   Coagulation profile  Recent Labs Lab 12/23/15 1312  INR 1.75   Inpatient Medications  Scheduled Meds: . collagenase   Topical Daily  . docusate sodium  100 mg Oral BID  . dorzolamide-timolol  1 drop Both Eyes BID  . enoxaparin (LOVENOX) injection  30 mg Subcutaneous Q24H  . famotidine  10 mg Oral BID  . feeding supplement (ENSURE ENLIVE)  237 mL Oral BID WC  . fluconazole (DIFLUCAN) IV  100 mg Intravenous Q24H  . fludrocortisone  0.1 mg Oral q morning - 10a  . latanoprost  1 drop Both Eyes QHS  . levofloxacin (LEVAQUIN) IV  750 mg Intravenous Q48H  . metoprolol tartrate  25 mg Oral BID  . pantoprazole  40 mg Oral Q0600  . polyethylene glycol  17 g Oral Daily  . valproate sodium  500 mg Intravenous Daily   Continuous Infusions: . 0.9 % NaCl with KCl 40 mEq / L 10 mL/hr (12/27/15 0058)   PRN Meds:.acetaminophen **OR**  acetaminophen, acetaminophen, albuterol, magic mouthwash, ondansetron **OR** ondansetron (ZOFRAN) IV, phenol, polyethylene glycol, senna, traMADol  Micro Results Recent Results (from the past 240 hour(s))  Urine culture     Status: None   Collection Time: 12/19/15 11:11 PM  Result Value Ref Range Status   Specimen Description URINE, RANDOM  Final   Special Requests NONE  Final   Culture NO GROWTH Performed at [facility]   Final   Report Status 12/21/2015 FINAL  Final  Surgical pcr screen     Status: None   Collection Time: 12/20/15 11:32 PM  Result Value Ref Range Status   MRSA, PCR NEGATIVE NEGATIVE Final   Staphylococcus aureus NEGATIVE NEGATIVE Final    Comment:        The Xpert SA Assay (FDA approved for NASAL specimens in patients over  54 years of age), is one component of a comprehensive surveillance program.  Test performance has been validated by [facility] for patients greater than or equal to 55 year old. It is not intended to diagnose infection nor to guide or monitor treatment.   Culture, Urine     Status: None   Collection Time: 12/22/15  3:55 PM  Result Value Ref Range Status   Specimen Description URINE, CLEAN CATCH  Final   Special Requests NONE  Final   Culture NO GROWTH  Final   Report Status 12/23/2015 FINAL  Final  Culture, blood (x 2)     Status: None (Preliminary result)   Collection Time: 12/23/15  1:12 PM  Result Value Ref Range Status   Specimen Description BLOOD BLOOD RIGHT ARM  Final   Special Requests BOTTLES DRAWN AEROBIC ONLY 6CC  Final   Culture NO GROWTH 3 DAYS  Final   Report Status PENDING  Incomplete  Culture, blood (x 2)     Status: None (Preliminary result)   Collection Time: 12/23/15  1:17 PM  Result Value Ref Range Status   Specimen Description BLOOD BLOOD RIGHT HAND  Final   Special Requests IN PEDIATRIC BOTTLE 2CC  Final   Culture NO GROWTH 3 DAYS  Final   Report Status PENDING  Incomplete    Radiology  Reports Dg Chest 1 View  Result Date: 12/19/2015 CLINICAL DATA:  Hip fracture, fall EXAM: CHEST 1 VIEW COMPARISON:  12/15/2015 FINDINGS: Semi-erect view of the chest demonstrates mild hazy bibasilar opacity, probable atelectasis. No large effusion. Cardiomediastinal silhouette stable with atherosclerosis. No pneumothorax. Skin fold artifact over the left chest. IMPRESSION: Hazy bibasilar atelectasis. Atherosclerosis of the aorta Electronically Signed   By: Donavan Foil M.D.   On: 12/19/2015 18:11   Ct Head Wo Contrast  Result Date: 12/11/2015 CLINICAL DATA:  Altered mental status. EXAM: CT HEAD WITHOUT CONTRAST TECHNIQUE: Contiguous axial images were obtained from the base of the skull through the vertex without intravenous contrast. COMPARISON:  CT scan of November 23, 2015. FINDINGS: Brain: Mild chronic ischemic white matter disease is noted. No mass effect or midline shift is noted. Ventricular size is within normal limits. There is no evidence of mass lesion, hemorrhage or acute infarction. Vascular: Atherosclerosis of carotid siphons is noted. Skull: Bony calvarium appears intact. Sinuses/Orbits: Paranasal sinuses appear normal. Other: None. IMPRESSION: Mild chronic ischemic white matter disease. No acute intracranial abnormality seen. Electronically Signed   By: Marijo Conception, M.D.   On: 12/11/2015 20:09   Ct Angio Chest Pe W Or Wo Contrast  Result Date: 12/23/2015 CLINICAL DATA:  F/U on fever. H/O dysphagia, COPD, pneumonia. Pt was very feisty, kinked up for scan. Pt tried to pull iv in the middle of scan causing extravasation with the kinky posture. Scan repeated and with new iv. 70 ML ISOVUE 370 and 60 .*comment was truncated* EXAM: CT ANGIOGRAPHY CHEST WITH CONTRAST TECHNIQUE: Multidetector CT imaging of the chest was performed using the standard protocol during bolus administration of intravenous contrast. Multiplanar CT image reconstructions and MIPs were obtained to evaluate the vascular  anatomy. CONTRAST:  130 cc Isovue COMPARISON:  None FINDINGS: Cardiovascular: No filling defects within pulmonary to suggest acute pulmonary embolism. No pericardial fluid. No acute findings aorta great vessels. Mediastinum/Nodes: No axillary or supraclavicular lymphadenopathy. No mediastinal hilar lymphadenopathy. No pericardial fluid. Lungs/Pleura: Bilateral small effusions. There is mild airspace disease in the RIGHT lower lobe. Centrilobular emphysema in the upper lobes. Upper Abdomen: Limited  view of the liver, kidneys, pancreas are unremarkable. Normal adrenal glands. Musculoskeletal: Kyphoscoliosis.  No acute findings Review of the MIP images confirms the above findings. IMPRESSION: 1. No evidence acute pulmonary embolism. 2. Small bilateral pleural effusions and mild interstitial edema. 3. Mild airspace disease in the RIGHT lower lobe. Differential includes aspiration pneumonitis, pneumonia, or asymmetric edema. Electronically Signed   By: Suzy Bouchard M.D.   On: 12/23/2015 20:33   Dg Pelvis Portable  Result Date: 12/21/2015 CLINICAL DATA:  Right hip replacement EXAM: PORTABLE PELVIS 1-2 VIEWS COMPARISON:  12/19/2015 FINDINGS: AP portable supine view of the pelvis. Only the lower pelvis is included. The patient is status post right hip replacement with satisfactory alignment. Soft tissue staples laterally. Small amount of soft tissue gas, postoperative. IMPRESSION: A interim right hip replacement with satisfactory alignment. Electronically Signed   By: Donavan Foil M.D.   On: 12/21/2015 20:11   Dg Chest Port 1 View  Result Date: 12/22/2015 CLINICAL DATA:  Fevers EXAM: PORTABLE CHEST 1 VIEW COMPARISON:  12/19/2015 FINDINGS: Increasing right-sided pleural effusion is noted. The cardiac shadow is stable. No focal infiltrate is seen. No bony abnormality is noted. IMPRESSION: Slight increase in right-sided pleural effusion. No other focal abnormality is noted. Electronically Signed   By: Inez Catalina  M.D.   On: 12/22/2015 14:01   Dg Chest Port 1 View  Result Date: 12/15/2015 CLINICAL DATA:  82 year old female with pneumonia. Subsequent encounter. EXAM: PORTABLE CHEST 1 VIEW COMPARISON:  12/11/2015, 08/12/2015 and 01/26/2015. FINDINGS: Slightly asymmetric airspace disease greater on the right which may represent a mild pulmonary edema. No segmental consolidation. Prominent skin fold on left without pneumothorax. Calcified tortuous aorta. Heart size within normal limits. No plain film evidence of pulmonary malignancy. Shoulder joint degenerative changes and scoliosis thoracic spine. IMPRESSION: Findings suggestive of mild pulmonary edema. Electronically Signed   By: Genia Del M.D.   On: 12/15/2015 13:08   Dg Chest Port 1 View  Result Date: 12/11/2015 CLINICAL DATA:  Back pain. EXAM: PORTABLE CHEST 1 VIEW COMPARISON:  August 12, 2015 FINDINGS: The heart size borderline. The hila and mediastinum are unchanged. No pulmonary nodules or masses. Mild interstitial prominence in the right chest, new in the interval. No other acute abnormalities. IMPRESSION: Mild interstitial prominence in the right lung, asymmetric to the left could represent asymmetric edema or an atypical infection. Recommend clinical correlation and follow-up to resolution. Electronically Signed   By: Dorise Bullion III M.D   On: 12/11/2015 16:16   Dg Hip Unilat With Pelvis 2-3 Views Right  Result Date: 12/19/2015 CLINICAL DATA:  Fall at nursing facility EXAM: DG HIP (WITH OR WITHOUT PELVIS) 2-3V RIGHT COMPARISON:  01/27/2015 FINDINGS: The left hip demonstrates normal positioning. The pubic symphysis is intact. There is a mildly comminuted right femoral neck fracture with mild superior migration of the distal femur. The right femoral head remains seated within the acetabulum. There is possible underlying lucency within the superolateral femoral neck. There is mild varus angulation. IMPRESSION: 1. Mildly comminuted, displaced and  angulated right femoral neck fracture. Possible lucency within the underlying superolateral femoral neck, pathologic fracture cannot be excluded. 2. The left hip is within normal limits Electronically Signed   By: Donavan Foil M.D.   On: 12/19/2015 18:09    Time Spent in minutes  25  Faye Ramsay M.D on 12/27/2015 at 10:09 AM  Between 7am to 7pm - Pager - [phone]  After 7pm go to [email or web address] - password [email or web address]  [name] -  Office  [phone]

## 2015-12-27 NOTE — Progress Notes (Signed)
Modified Barium Swallow Progress Note  Patient Details  Name: Taylor Roth MRN: [id number] Date of Birth: 12-18-1928  Today's Date: 12/27/2015  Modified Barium Swallow completed.  Full report located under Chart Review in the Imaging Section.  Brief recommendations include the following:  Clinical Impression  Pt presents with severe oropharyngeal dysphagia further complicated by esophageal dysphagia. Silent aspiration of thin and puree noted. No other consistencies were trialed secondary to worsening pharyngeal residue with items with increased viscosity. Oral holding and pharyngeal pooling without pt awareness for multiple minutes prior to swallow response despite frequent verbal and tactile cueing to initiate swallow. No safe PO established. Discussed with physician. Will sign off at this time. Please reconsult if pt's status improves and reassessment is appropriate.    Swallow Evaluation Recommendations       SLP Diet Recommendations:  (per physician/family conversation)       Medication Administration: Crushed with puree       Compensations: Minimize environmental distractions;Slow rate;Small sips/bites;Follow solids with liquid   Postural Changes: Remain semi-upright after after feeds/meals (Comment);Seated upright at 90 degrees   Oral Care Recommendations: Oral care BID        [place], [place] Pager [phone] 12/27/2015,4:14 PM

## 2015-12-28 DIAGNOSIS — H409 Unspecified glaucoma: Secondary | ICD-10-CM

## 2015-12-28 DIAGNOSIS — N183 Chronic kidney disease, stage 3 (moderate): Secondary | ICD-10-CM

## 2015-12-28 DIAGNOSIS — M8000XA Age-related osteoporosis with current pathological fracture, unspecified site, initial encounter for fracture: Secondary | ICD-10-CM

## 2015-12-28 LAB — CULTURE, BLOOD (ROUTINE X 2)
CULTURE: NO GROWTH
CULTURE: NO GROWTH

## 2015-12-28 LAB — BASIC METABOLIC PANEL
Anion gap: 10 (ref 5–15)
BUN: 12 mg/dL (ref 4–21)
BUN: 12 mg/dL (ref 6–20)
CALCIUM: 8.3 mg/dL — AB (ref 8.9–10.3)
CHLORIDE: 106 mmol/L (ref 101–111)
CO2: 23 mmol/L (ref 22–32)
CREATININE: 0.6 mg/dL (ref 0.5–1.1)
CREATININE: 0.6 mg/dL (ref 0.44–1.00)
GFR calc Af Amer: >60 mL/min (ref >60)
GFR calc non Af Amer: >60 mL/min (ref >60)
Glucose, Bld: 110 mg/dL — ABNORMAL HIGH (ref 65–99)
Glucose: 110 mg/dL
POTASSIUM: 3.2 mmol/L — AB (ref 3.4–5.3)
Potassium: 3.2 mmol/L — ABNORMAL LOW (ref 3.5–5.1)
SODIUM: 139 mmol/L (ref 137–147)
SODIUM: 139 mmol/L (ref 135–145)

## 2015-12-28 LAB — VALPROIC ACID LEVEL: Valproic Acid Lvl: 38 ug/mL — ABNORMAL LOW (ref 50.0–100.0)

## 2015-12-28 MED ORDER — KCL IN DEXTROSE-NACL 40-5-0.9 MEQ/L-%-% IV SOLN
INTRAVENOUS | Status: DC
  Administered 2015-12-28 – 2015-12-30 (×4): via INTRAVENOUS
  Filled 2015-12-28 (×8): qty 1000

## 2015-12-28 NOTE — Progress Notes (Signed)
PROGRESS NOTE    Taylor Roth  BZJ:[id number] DOB: [birth year] DOA: 12/19/2015 PCP: Pcp Not In System   Brief Narrative:  Taylor Roth is a 82 y.o. female with bipolar disorder, mild dementia, hypertension, benzos and narcotics use, CKD stage IV, COPD, GERD, depression who was recently hospitalized for pneumonia and COPD exacerbation and discharged to skilled nursing facility. Patient was participating with physical therapy at the facility but reportedly had an unwitnessed fall 2 days prior to admission. Since then she was not participating with PT and remained in bed. When daughter went to visit her on the day of admission she was found to be very confused and lethargic.   Assessment & Plan:   Principal Problem:   Closed right hip fracture, initial encounter ([place]) Active Problems:   COPD (chronic obstructive pulmonary disease) (HCC)   Osteoporosis   Glaucoma   Protein-calorie malnutrition, severe (HCC)   CKD (chronic kidney disease), stage III   Encephalopathy   Decubitus ulcer of hip, stage 3 (HCC)   Closed right hip fracture S/p open treatment of femoral fracture, proximal end, neck, prosthetic replacement -orthopedic surgery recommendations -PT: SNF  Acute metabolic encephalopathy Recent pneumonia and evidence of possible aspiration pneumonia on recent CT chest. Appears there has not been much improvement in mental status up until now. CT from end of October (symptoms include AMS) unremarkable for cause of encephalopathy. -EEG -valproic acid level  Aspiration pneumonia, RLL -continue Levaquin  Oral thrush -continue diflucan  Hypernatremia Resolved  Hypokalemia -add potassium to fluids overnight  Protein-calorie malnutrition, severe Severe aspiration risk -nutrition recommendations  CKD, stage III Stable  Decubitus ulcer of hip, stage 3 -wound care recommendations  Depression Bipolar disorder On Depakote   DVT prophylaxis: Lovenox Code Status:  DNR Family Communication: Daughter at bedside Disposition Plan: Discharge to SNF once plan for encephalopathy   Consultants:   Orthopedic surgery  Procedures:   Open treatment of femoral fracture, proximal end, neck, prosthetic replacement  Antimicrobials:  Levaquin  (11/12>>  Diflucan (11/9>>   Subjective: Patient able to answer questions appropriately. She has some right hip pain. Otherwise, no complaints.  Objective: Vitals:   12/27/15 0817 12/27/15 2209 12/28/15 0752 12/28/15 1300  BP: (!) 151/65 (!) 161/64 (!) 148/63 126/71  Pulse: 74 88 75 79  Resp: 20 16 16 16   Temp:  99 F (37.2 C) 98.6 F (37 C) 98.5 F (36.9 C)  TempSrc:  Axillary Axillary Oral  SpO2: 94% 96% 96% 98%  Weight:      Height:        Intake/Output Summary (Last 24 hours) at 12/28/15 1600 Last data filed at 12/28/15 1500  Gross per 24 hour  Intake              580 ml  Output             1200 ml  Net             -620 ml   Filed Weights   12/19/15 1532  Weight: 45.2 kg (99 lb 10.4 oz)    Examination:  General exam: Appears calm and comfortable Respiratory system: Clear to auscultation. Respiratory effort normal. Cardiovascular system: S1 & S2 heard, RRR. No murmurs, rubs, gallops or clicks. Gastrointestinal system: Abdomen is nondistended, soft and nontender. Normal bowel sounds heard. Central nervous system: Alert. Extremities: No edema. No calf tenderness. Right hip tenderness Skin: No cyanosis. No rashes Psychiatry: seems confused. Oriented to person and place (although she thinks she is  still at [facility])    Data Reviewed: I have personally reviewed following labs and imaging studies  CBC:  Recent Labs Lab 12/23/15 0310 12/24/15 0534 12/25/15 0358 12/26/15 0416 12/27/15 0346  WBC 13.6* 9.2 8.2 5.7 5.7  HGB 8.8* 8.1* 8.4* 8.3* 8.4*  HCT 28.2* 25.8* 27.3* 25.9* 27.1*  MCV 91.6 90.8 91.0 89.0 88.6  PLT 284 269 331 357 616*   Basic Metabolic Panel:  Recent  Labs Lab 12/24/15 0534 12/25/15 0358 12/26/15 0416 12/27/15 0346 12/28/15 0431  NA 144 140 141 142 139  K 3.5 3.1* 2.7* 3.5 3.2*  CL 114* 110 109 110 106  CO2 22 19* 22 23 23   GLUCOSE 94 86 98 106* 110*  BUN 19 15 8 6 12   CREATININE 0.60 0.64 0.49 0.50 0.60  CALCIUM 8.4* 8.2* 8.1* 8.4* 8.3*   GFR: Estimated Creatinine Clearance: 35.1 mL/min (by C-G formula based on SCr of 0.6 mg/dL). Liver Function Tests: No results for input(s): AST, ALT, ALKPHOS, BILITOT, PROT, ALBUMIN in the last 168 hours. No results for input(s): LIPASE, AMYLASE in the last 168 hours. No results for input(s): AMMONIA in the last 168 hours. Coagulation Profile:  Recent Labs Lab 12/23/15 1312  INR 1.75   Cardiac Enzymes: No results for input(s): CKTOTAL, CKMB, CKMBINDEX, TROPONINI in the last 168 hours. BNP (last 3 results) No results for input(s): PROBNP in the last 8760 hours. HbA1C: No results for input(s): HGBA1C in the last 72 hours. CBG: No results for input(s): GLUCAP in the last 168 hours. Lipid Profile: No results for input(s): CHOL, HDL, LDLCALC, TRIG, CHOLHDL, LDLDIRECT in the last 72 hours. Thyroid Function Tests: No results for input(s): TSH, T4TOTAL, FREET4, T3FREE, THYROIDAB in the last 72 hours. Anemia Panel: No results for input(s): VITAMINB12, FOLATE, FERRITIN, TIBC, IRON, RETICCTPCT in the last 72 hours. Sepsis Labs:  Recent Labs Lab 12/22/15 1048 12/22/15 1427 12/23/15 1312 12/23/15 1528 12/24/15 0534 12/26/15 0416  PROCALCITON 0.16  --   --   --  1.98 0.51  LATICACIDVEN 1.4 2.4* 1.2 2.6*  --   --     Recent Results (from the past 240 hour(s))  Urine culture     Status: None   Collection Time: 12/19/15 11:11 PM  Result Value Ref Range Status   Specimen Description URINE, RANDOM  Final   Special Requests NONE  Final   Culture NO GROWTH Performed at [facility]   Final   Report Status 12/21/2015 FINAL  Final  Surgical pcr screen     Status: None    Collection Time: 12/20/15 11:32 PM  Result Value Ref Range Status   MRSA, PCR NEGATIVE NEGATIVE Final   Staphylococcus aureus NEGATIVE NEGATIVE Final    Comment:        The Xpert SA Assay (FDA approved for NASAL specimens in patients over 38 years of age), is one component of a comprehensive surveillance program.  Test performance has been validated by [facility] for patients greater than or equal to 61 year old. It is not intended to diagnose infection nor to guide or monitor treatment.   Culture, Urine     Status: None   Collection Time: 12/22/15  3:55 PM  Result Value Ref Range Status   Specimen Description URINE, CLEAN CATCH  Final   Special Requests NONE  Final   Culture NO GROWTH  Final   Report Status 12/23/2015 FINAL  Final  Culture, blood (x 2)     Status: None (Preliminary  result)   Collection Time: 12/23/15  1:12 PM  Result Value Ref Range Status   Specimen Description BLOOD BLOOD RIGHT ARM  Final   Special Requests BOTTLES DRAWN AEROBIC ONLY 6CC  Final   Culture NO GROWTH 4 DAYS  Final   Report Status PENDING  Incomplete  Culture, blood (x 2)     Status: None (Preliminary result)   Collection Time: 12/23/15  1:17 PM  Result Value Ref Range Status   Specimen Description BLOOD BLOOD RIGHT HAND  Final   Special Requests IN PEDIATRIC BOTTLE 2CC  Final   Culture NO GROWTH 4 DAYS  Final   Report Status PENDING  Incomplete         Radiology Studies: Dg Swallowing Func-speech Pathology  Result Date: 12/27/2015 Objective Swallowing Evaluation: Type of Study: MBS-Modified Barium Swallow Study Patient Details Name: Taylor Roth MRN: [id number] Date of Birth: 1928/06/05 Today's Date: 12/27/2015 Time: SLP Start Time (ACUTE ONLY): 1330-SLP Stop Time (ACUTE ONLY): 1400 SLP Time Calculation (min) (ACUTE ONLY): 30 min Past Medical History: Past Medical History: Diagnosis Date . Abuse   benzos/narcotics . Anxiety  . Arterial tortuosity (aorta) 12/11/2012 . Asthma  .  Chronic kidney disease, stage IV (severe) ([place]) 12/11/2012 . Chronic mental illness  . Colitis  . COPD (chronic obstructive pulmonary disease) ([place])  . Depression  . Dysphagia 01/11/2015 . GERD (gastroesophageal reflux disease)  . Insomnia, unspecified  . Migraines  . Nasal bone fracture 03/30/2014 . Osteoporosis  . Schatzki's ring 01/14/2015 . Scoliosis  . Substance abuse   Benzos and Narcotics, she does not get any of those medicines from [facility], we have empahtically told her that.  . Vertigo  . Vitamin B deficiency  . Weight loss  Past Surgical History: Past Surgical History: Procedure Laterality Date . ABDOMINAL HYSTERECTOMY   . CATARACT EXTRACTION   . CHOLECYSTECTOMY   . ESOPHAGOGASTRODUODENOSCOPY (EGD) WITH PROPOFOL N/A 01/13/2015  Procedure: ESOPHAGOGASTRODUODENOSCOPY (EGD) WITH PROPOFOL;  Surgeon: [name], MD;  Location: [facility];  Service: Endoscopy;  Laterality: N/A; . HIP ARTHROPLASTY Right 12/21/2015  Procedure: ARTHROPLASTY BIPOLAR HIP (HEMIARTHROPLASTY);  Surgeon: [name], MD;  Location: [facility];  Service: Orthopedics;  Laterality: Right; . PARTIAL HYSTERECTOMY   HPI: Taylor Roth is a 82 y.o. female with medical history significant of benzo/narcotics abuse, anxiety, depression, CKD, COPD, GERD, migraines, osteoporosis, scoliosis, substance abuse, vertigo admitted after fall.  Found to have a hip fx and plans are for surgery.  Pt has been admitted recently with pna per her daughter and has baseline esophageal dysphagia with prior food impaction per chart review.  Esophagram 01/18/2015 done showed poor primary esophageal stripping wave and tertiary contractions and a prominent kink of mid and distal thoracic esophagus resulting in lodged barium tablet that did not clear despite warm water ingestion over 10 minutes.  Per family within the last weeks, pt has needed someone to feed her at her facility.  Subjective: Pt lethargic, but alerted with verbal/tactile stim. Assessment / Plan /  Recommendation CHL IP CLINICAL IMPRESSIONS 12/27/2015 Therapy Diagnosis Severe pharyngeal phase dysphagia;Suspected primary esophageal dysphagia;Moderate oral phase dysphagia Clinical Impression Pt presents with severe oropharyngeal dysphagia further complicated by esophageal dysphagia. Silent aspiration of thin and puree noted. No other consistencies were trialed secondary to worsening pharyngeal residue with items with increased viscosity. Oral holding and pharyngeal pooling without pt awareness for multiple minutes prior to swallow response despite frequent verbal and tactile cueing to initiate swallow. No safe PO established. Discussed  with physician. Will sign off at this time. Please reconsult if pt's status improves and reassessment is appropriate.  Impact on safety and function Severe aspiration risk   CHL IP TREATMENT RECOMMENDATION 12/27/2015 Treatment Recommendations Patient unable to participate in swallow therapy at this time   Prognosis 12/27/2015 Prognosis for Safe Diet Advancement Guarded Barriers to Reach Goals -- Barriers/Prognosis Comment -- CHL IP DIET RECOMMENDATION 12/27/2015 SLP Diet Recommendations (No Data) Liquid Administration via -- Medication Administration Crushed with puree Compensations Minimize environmental distractions;Slow rate;Small sips/bites;Follow solids with liquid Postural Changes Remain semi-upright after after feeds/meals (Comment);Seated upright at 90 degrees   CHL IP OTHER RECOMMENDATIONS 12/27/2015 Recommended Consults -- Oral Care Recommendations Oral care BID Other Recommendations --   CHL IP FOLLOW UP RECOMMENDATIONS 12/27/2015 Follow up Recommendations Skilled Nursing facility   [facility] IP FREQUENCY AND DURATION 12/20/2015 Speech Therapy Frequency (ACUTE ONLY) min 1 x/week Treatment Duration 1 week      CHL IP ORAL PHASE 12/27/2015 Oral Phase Impaired Oral - Pudding Teaspoon -- Oral - Pudding Cup -- Oral - Honey Teaspoon -- Oral - Honey Cup -- Oral - Nectar Teaspoon --  Oral - Nectar Cup -- Oral - Nectar Straw -- Oral - Thin Teaspoon -- Oral - Thin Cup -- Oral - Thin Straw -- Oral - Puree -- Oral - Mech Soft -- Oral - Regular -- Oral - Multi-Consistency -- Oral - Pill -- Oral Phase - Comment no attempts to masticate solids  CHL IP PHARYNGEAL PHASE 12/27/2015 Pharyngeal Phase Impaired Pharyngeal- Pudding Teaspoon -- Pharyngeal -- Pharyngeal- Pudding Cup -- Pharyngeal -- Pharyngeal- Honey Teaspoon -- Pharyngeal -- Pharyngeal- Honey Cup -- Pharyngeal -- Pharyngeal- Nectar Teaspoon -- Pharyngeal -- Pharyngeal- Nectar Cup -- Pharyngeal -- Pharyngeal- Nectar Straw -- Pharyngeal -- Pharyngeal- Thin Teaspoon Delayed swallow initiation-pyriform sinuses;Reduced epiglottic inversion;Reduced airway/laryngeal closure;Reduced pharyngeal peristalsis Pharyngeal -- Pharyngeal- Thin Cup Delayed swallow initiation-pyriform sinuses;Reduced airway/laryngeal closure;Reduced pharyngeal peristalsis;Reduced epiglottic inversion;Pharyngeal residue - valleculae;Pharyngeal residue - pyriform;Lateral channel residue Pharyngeal -- Pharyngeal- Thin Straw Delayed swallow initiation-pyriform sinuses;Reduced pharyngeal peristalsis;Reduced epiglottic inversion;Reduced airway/laryngeal closure;Penetration/Apiration after swallow;Moderate aspiration;Lateral channel residue;Pharyngeal residue - valleculae;Pharyngeal residue - pyriform;Pharyngeal residue - posterior pharnyx Pharyngeal Material enters airway, passes BELOW cords without attempt by patient to eject out (silent aspiration) Pharyngeal- Puree Delayed swallow initiation-pyriform sinuses;Pharyngeal residue - pyriform;Pharyngeal residue - posterior pharnyx;Moderate aspiration;Penetration/Apiration after swallow;Reduced airway/laryngeal closure;Lateral channel residue Pharyngeal Material enters airway, passes BELOW cords without attempt by patient to eject out (silent aspiration) Pharyngeal- Mechanical Soft -- Pharyngeal -- Pharyngeal- Regular -- Pharyngeal --  Pharyngeal- Multi-consistency -- Pharyngeal -- Pharyngeal- Pill Pharyngeal residue - pyriform Pharyngeal -- Pharyngeal Comment --  CHL IP CERVICAL ESOPHAGEAL PHASE 12/27/2015 Cervical Esophageal Phase Impaired Pudding Teaspoon -- Pudding Cup -- Honey Teaspoon -- Honey Cup -- Nectar Teaspoon -- Nectar Cup -- Nectar Straw -- Thin Teaspoon -- Thin Cup -- Thin Straw -- Puree Esophageal backflow into cervical esophagus Mechanical Soft -- Regular -- Multi-consistency -- Pill (No Data) Cervical Esophageal Comment -- CHL IP GO 12/10/2012 Functional Assessment Tool Used clinical judgement Functional Limitations Swallowing Swallow Current Status (J6283) CI Swallow Goal Status (M6294) [place] Swallow Discharge Status (T6546) (None) Motor Speech Current Status (T0354) (None) Motor Speech Goal Status (S5681) (None) Motor Speech Goal Status (E7517) (None) Spoken Language Comprehension Current Status (G0174) (None) Spoken Language Comprehension Goal Status (B4496) (None) Spoken Language Comprehension Discharge Status (P5916) (None) Spoken Language Expression Current Status (B8466) (None) Spoken Language Expression Goal Status (Z9935) (None) Spoken Language Expression Discharge Status (T0177) (None) Attention Current Status (  G9165) (None) Attention Goal Status [phone]) (None) Attention Discharge Status [phone]) (None) Memory Current Status (L7373) (None) Memory Goal Status (G6815) (None) Memory Discharge Status (T4707) (None) Voice Current Status (A1518) (None) Voice Goal Status (D4373) (None) Voice Discharge Status (H7897) (None) Other Speech-Language Pathology Functional Limitation [phone]) (None) Other Speech-Language Pathology Functional Limitation Goal Status (X2820) (None) Other Speech-Language Pathology Functional Limitation Discharge Status [phone]) (None) Vinetta Bergamo MA, [place] Pager [phone] 12/27/2015, 4:13 PM                   Scheduled Meds: . collagenase   Topical Daily  . docusate sodium  100 mg Oral BID  .  dorzolamide-timolol  1 drop Both Eyes BID  . enoxaparin (LOVENOX) injection  30 mg Subcutaneous Q24H  . famotidine  10 mg Oral BID  . feeding supplement (ENSURE ENLIVE)  237 mL Oral TID WC  . fluconazole (DIFLUCAN) IV  100 mg Intravenous Q24H  . fludrocortisone  0.1 mg Oral q morning - 10a  . latanoprost  1 drop Both Eyes QHS  . levofloxacin (LEVAQUIN) IV  750 mg Intravenous Q48H  . metoprolol tartrate  25 mg Oral BID  . pantoprazole  40 mg Oral Q0600  . polyethylene glycol  17 g Oral Daily  . valproate sodium  500 mg Intravenous Daily   Continuous Infusions: . 0.9 % NaCl with KCl 40 mEq / L 10 mL/hr (12/27/15 0058)     LOS: 9 days     Cordelia Poche [name] 12/28/2015, 4:00 PM Pager: [phone]  If 7PM-7AM, please contact night-coverage [email or web address] Password [email or web address] 12/28/2015, 4:00 PM

## 2015-12-28 NOTE — Care Management (Signed)
Case manager spoke with attending concerning discharge plan. Patient is experiencing some dysphagia, MD will assess and make determination concerning discharge disposition. Plan is for patient to return to [facility].

## 2015-12-28 NOTE — Progress Notes (Signed)
Physical Therapy Treatment Patient Details Name: Taylor Roth MRN: [id number] DOB: April 12, 1928 Today's Date: 12/28/2015    History of Present Illness Pt is a 82 y.o. female with medical history significant of benzo/narcotics abuse, anxiety, depression, CKD, COPD, GERD, migraines, osteoporosis, scoliosis, substance abuse, vertigo admitted after fall.  Pt s/p R THA with posterior precautions    PT Comments    Patient continues to be lethargic and maintain eyes closed most of session. +2 for mobility. Continue to progress as tolerated with anticipated d/c to SNF for further skilled PT services.    Follow Up Recommendations  SNF;Supervision/Assistance - 24 hour     Equipment Recommendations  None recommended by PT    Recommendations for Other Services       Precautions / Restrictions Precautions Precautions: Posterior Hip;Fall Restrictions Weight Bearing Restrictions: Yes RLE Weight Bearing: Weight bearing as tolerated    Mobility  Bed Mobility Overal bed mobility: Needs Assistance;+2 for physical assistance Bed Mobility: Supine to Sit     Supine to sit: Max assist;+2 for physical assistance     General bed mobility comments: assist for all aspects of bed mobility; cues for sequencing and technique however pt not following commands  Transfers Overall transfer level: Needs assistance   Transfers: Stand Pivot Transfers;Sit to/from Stand Sit to Stand: Max assist;+2 physical assistance Stand pivot transfers: +2 physical assistance;Max assist       General transfer comment: +2 to power up into standing and to pivot with pt turning bilat feet during pivot; pt kept eyes closed during transfer  Ambulation/Gait                 Stairs            Wheelchair Mobility    Modified Rankin (Stroke Patients Only)       Balance     Sitting balance-Leahy Scale: Poor       Standing balance-Leahy Scale: Zero                      Cognition  Arousal/Alertness: Lethargic;Suspect due to medications Behavior During Therapy: Flat affect Overall Cognitive Status: Difficult to assess Area of Impairment: Following commands       Following Commands: Follows one step commands inconsistently;Follows one step commands with increased time            Exercises      General Comments        Pertinent Vitals/Pain Pain Assessment: Faces Faces Pain Scale: Hurts a little bit Pain Location: R hip Pain Descriptors / Indicators: Discomfort;Guarding;Sore Pain Intervention(s): Limited activity within patient's tolerance;Monitored during session;Premedicated before session;Repositioned    Home Living                      Prior Function            PT Goals (current goals can now be found in the care plan section) Acute Rehab PT Goals Patient Stated Goal: none stated Progress towards PT goals: Not progressing toward goals - comment    Frequency    Min 3X/week      PT Plan Current plan remains appropriate    Co-evaluation             End of Session Equipment Utilized During Treatment: Gait belt Activity Tolerance: Patient limited by lethargy Patient left: in chair;with call bell/phone within reach;with chair alarm set;with family/visitor present     Time: 1601-0932 PT Time Calculation (min) (ACUTE ONLY):  22 min  Charges:  $Therapeutic Activity: 8-22 mins                    G Codes:      [place] April, PTA Pager: [phone]   12/28/2015, 11:35 AM

## 2015-12-29 ENCOUNTER — Inpatient Hospital Stay (HOSPITAL_COMMUNITY): Payer: Medicare Other

## 2015-12-29 LAB — BASIC METABOLIC PANEL
ANION GAP: 11 (ref 5–15)
BUN: 10 mg/dL (ref 6–20)
BUN: 10 mg/dL (ref 4–21)
CALCIUM: 8.5 mg/dL — AB (ref 8.9–10.3)
CO2: 21 mmol/L — ABNORMAL LOW (ref 22–32)
Chloride: 107 mmol/L (ref 101–111)
Creatinine, Ser: 0.55 mg/dL (ref 0.44–1.00)
Creatinine: 0.6 mg/dL (ref 0.5–1.1)
GFR calc Af Amer: >60 mL/min (ref >60)
GLUCOSE: 114 mg/dL
GLUCOSE: 114 mg/dL — AB (ref 65–99)
Potassium: 3.8 mmol/L (ref 3.5–5.1)
SODIUM: 139 mmol/L (ref 135–145)
Sodium: 139 mmol/L (ref 137–147)

## 2015-12-29 MED ORDER — FLUCONAZOLE 100 MG PO TABS
100.0000 mg | ORAL_TABLET | Freq: Every day | ORAL | Status: DC
  Administered 2015-12-29 – 2015-12-31 (×3): 100 mg via ORAL
  Filled 2015-12-29 (×3): qty 1

## 2015-12-29 MED ORDER — DIVALPROEX SODIUM ER 500 MG PO TB24
500.0000 mg | ORAL_TABLET | Freq: Every day | ORAL | Status: DC
  Administered 2015-12-29 – 2015-12-30 (×2): 500 mg via ORAL
  Filled 2015-12-29 (×3): qty 1

## 2015-12-29 NOTE — Progress Notes (Signed)
EEG completed, results pending. 

## 2015-12-29 NOTE — Progress Notes (Signed)
Notified social worker gina regarding [facility], explained that Taylor Roth daughter would like to speak with her

## 2015-12-29 NOTE — Progress Notes (Signed)
PROGRESS NOTE    VITA CURRIN  HWE:[id number] DOB: 27-Feb-1928 DOA: 12/19/2015 PCP: Pcp Not In System   Brief Narrative:  Taylor Roth is a 82 y.o. female with bipolar disorder, mild dementia, hypertension, benzos and narcotics use, CKD stage IV, COPD, GERD, depression who was recently hospitalized for pneumonia and COPD exacerbation and discharged to skilled nursing facility. Patient was participating with physical therapy at the facility but reportedly had an unwitnessed fall 2 days prior to admission. Since then she was not participating with PT and remained in bed. When daughter went to visit her on the day of admission she was found to be very confused and lethargic.   Assessment & Plan:   Principal Problem:   Closed right hip fracture, initial encounter ([place]) Active Problems:   COPD (chronic obstructive pulmonary disease) (HCC)   Osteoporosis   Glaucoma   Protein-calorie malnutrition, severe (HCC)   CKD (chronic kidney disease), stage III   Encephalopathy   Decubitus ulcer of hip, stage 3 (HCC)   Closed right hip fracture S/p open treatment of femoral fracture, proximal end, neck, prosthetic replacement -orthopedic surgery recommendations -PT: SNF  Acute metabolic encephalopathy Recent pneumonia and evidence of possible aspiration pneumonia on recent CT chest. Appears there has not been much improvement in mental status up until now. CT from end of October (symptoms include AMS) unremarkable for cause of encephalopathy. This may be patient's new baseline from frequent pneumonias -EEG  Aspiration pneumonia, RLL Patient and daughter declining NG or G-tube. Explained patient's high risk for aspiration and recurrent pneumonia. -continue Levaquin  Oral thrush -continue diflucan  Hypernatremia Resolved  Hypokalemia Improved with repletion  Protein-calorie malnutrition, severe Severe aspiration risk -nutrition recommendations  CKD, stage  III Stable  Decubitus ulcer of hip, stage 3 -wound care recommendations  Depression Bipolar disorder On Depakote.   DVT prophylaxis: Lovenox Code Status: DNR Family Communication: Daughter at bedside Disposition Plan: Discharge to SNF once plan for encephalopathy   Consultants:   Orthopedic surgery  Procedures:   Open treatment of femoral fracture, proximal end, neck, prosthetic replacement  Antimicrobials:  Levaquin  (11/12>>11/16)  Diflucan (11/12>>   Subjective: Patient opens eyes to voice. Sleepy this AM.   Objective: Vitals:   12/28/15 1300 12/28/15 2145 12/29/15 0622 12/29/15 1230  BP: 126/71 (!) 147/80 (!) 164/74 (!) 164/74  Pulse: 79 89  89  Resp: 16 16    Temp: 98.5 F (36.9 C) 98.9 F (37.2 C) 98.1 F (36.7 C)   TempSrc: Oral Oral Axillary   SpO2: 98% 100%    Weight:      Height:        Intake/Output Summary (Last 24 hours) at 12/29/15 1703 Last data filed at 12/29/15 0830  Gross per 24 hour  Intake             1250 ml  Output                0 ml  Net             1250 ml   Filed Weights   12/19/15 1532  Weight: 45.2 kg (99 lb 10.4 oz)    Examination:  General exam: Appears calm and comfortable. Respiratory system: Clear to auscultation. Respiratory effort normal. Cardiovascular system: S1 & S2 heard, RRR. No murmurs, rubs, gallops or clicks. Gastrointestinal system: Abdomen is nondistended, soft and nontender. Normal bowel sounds heard. Central nervous system: Alert. Extremities: No edema. No calf tenderness. Right hip tenderness Skin: No  cyanosis. No rashes Psychiatry: seems confused.    Data Reviewed: I have personally reviewed following labs and imaging studies  CBC:  Recent Labs Lab 12/23/15 0310 12/24/15 0534 12/25/15 0358 12/26/15 0416 12/27/15 0346  WBC 13.6* 9.2 8.2 5.7 5.7  HGB 8.8* 8.1* 8.4* 8.3* 8.4*  HCT 28.2* 25.8* 27.3* 25.9* 27.1*  MCV 91.6 90.8 91.0 89.0 88.6  PLT 284 269 331 357 406*   Basic  Metabolic Panel:  Recent Labs Lab 12/25/15 0358 12/26/15 0416 12/27/15 0346 12/28/15 0431 12/29/15 0515  NA 140 141 142 139 139  K 3.1* 2.7* 3.5 3.2* 3.8  CL 110 109 110 106 107  CO2 19* 22 23 23  21*  GLUCOSE 86 98 106* 110* 114*  BUN 15 8 6 12 10   CREATININE 0.64 0.49 0.50 0.60 0.55  CALCIUM 8.2* 8.1* 8.4* 8.3* 8.5*   GFR: Estimated Creatinine Clearance: 35.1 mL/min (by C-G formula based on SCr of 0.55 mg/dL). Liver Function Tests: No results for input(s): AST, ALT, ALKPHOS, BILITOT, PROT, ALBUMIN in the last 168 hours. No results for input(s): LIPASE, AMYLASE in the last 168 hours. No results for input(s): AMMONIA in the last 168 hours. Coagulation Profile:  Recent Labs Lab 12/23/15 1312  INR 1.75   Cardiac Enzymes: No results for input(s): CKTOTAL, CKMB, CKMBINDEX, TROPONINI in the last 168 hours. BNP (last 3 results) No results for input(s): PROBNP in the last 8760 hours. HbA1C: No results for input(s): HGBA1C in the last 72 hours. CBG: No results for input(s): GLUCAP in the last 168 hours. Lipid Profile: No results for input(s): CHOL, HDL, LDLCALC, TRIG, CHOLHDL, LDLDIRECT in the last 72 hours. Thyroid Function Tests: No results for input(s): TSH, T4TOTAL, FREET4, T3FREE, THYROIDAB in the last 72 hours. Anemia Panel: No results for input(s): VITAMINB12, FOLATE, FERRITIN, TIBC, IRON, RETICCTPCT in the last 72 hours. Sepsis Labs:  Recent Labs Lab 12/23/15 1312 12/23/15 1528 12/24/15 0534 12/26/15 0416  PROCALCITON  --   --  1.98 0.51  LATICACIDVEN 1.2 2.6*  --   --     Recent Results (from the past 240 hour(s))  Urine culture     Status: None   Collection Time: 12/19/15 11:11 PM  Result Value Ref Range Status   Specimen Description URINE, RANDOM  Final   Special Requests NONE  Final   Culture NO GROWTH Performed at [facility]   Final   Report Status 12/21/2015 FINAL  Final  Surgical pcr screen     Status: None   Collection Time: 12/20/15  11:32 PM  Result Value Ref Range Status   MRSA, PCR NEGATIVE NEGATIVE Final   Staphylococcus aureus NEGATIVE NEGATIVE Final    Comment:        The Xpert SA Assay (FDA approved for NASAL specimens in patients over 54 years of age), is one component of a comprehensive surveillance program.  Test performance has been validated by [facility] for patients greater than or equal to 50 year old. It is not intended to diagnose infection nor to guide or monitor treatment.   Culture, Urine     Status: None   Collection Time: 12/22/15  3:55 PM  Result Value Ref Range Status   Specimen Description URINE, CLEAN CATCH  Final   Special Requests NONE  Final   Culture NO GROWTH  Final   Report Status 12/23/2015 FINAL  Final  Culture, blood (x 2)     Status: None   Collection Time: 12/23/15  1:12 PM  Result Value Ref Range Status   Specimen Description BLOOD BLOOD RIGHT ARM  Final   Special Requests BOTTLES DRAWN AEROBIC ONLY [place]  Final   Culture NO GROWTH 5 DAYS  Final   Report Status 12/28/2015 FINAL  Final  Culture, blood (x 2)     Status: None   Collection Time: 12/23/15  1:17 PM  Result Value Ref Range Status   Specimen Description BLOOD BLOOD RIGHT HAND  Final   Special Requests IN PEDIATRIC BOTTLE 2CC  Final   Culture NO GROWTH 5 DAYS  Final   Report Status 12/28/2015 FINAL  Final         Radiology Studies: No results found.      Scheduled Meds: . collagenase   Topical Daily  . divalproex  500 mg Oral Q breakfast  . docusate sodium  100 mg Oral BID  . dorzolamide-timolol  1 drop Both Eyes BID  . enoxaparin (LOVENOX) injection  30 mg Subcutaneous Q24H  . famotidine  10 mg Oral BID  . feeding supplement (ENSURE ENLIVE)  237 mL Oral TID WC  . fluconazole  100 mg Oral Daily  . fludrocortisone  0.1 mg Oral q morning - 10a  . latanoprost  1 drop Both Eyes QHS  . metoprolol tartrate  25 mg Oral BID  . pantoprazole  40 mg Oral Q0600  . polyethylene glycol  17 g Oral Daily    Continuous Infusions: . dextrose 5 % and 0.9 % NaCl with KCl 40 mEq/L 100 mL/hr at 12/29/15 0839     LOS: 10 days     Cordelia Poche [name] 12/29/2015, 5:03 PM Pager: [phone]  If 7PM-7AM, please contact night-coverage [email or web address] Password [email or web address] 12/29/2015, 5:03 PM

## 2015-12-29 NOTE — Progress Notes (Signed)
Pt went for EEG

## 2015-12-29 NOTE — Procedures (Signed)
Electroencephalogram (EEG) Report  Date of study: 12/29/15  Requesting clinician: Cordelia Poche, MD  Reason for study: evaluate for seizure  Brief clinical history: this is an 82 year old woman with history of bipolar disorder and mild dementia who has had persistent lethargy and confusion following a fall. EEG is being performed for further evaluation.  Medications:  Current Facility-Administered Medications:  .  acetaminophen (TYLENOL) tablet 650 mg, 650 mg, Oral, Q6H PRN, 650 mg at 12/28/15 0902 **OR** acetaminophen (TYLENOL) suppository 650 mg, 650 mg, Rectal, Q6H PRN, [name], MD, 650 mg at 12/24/15 0138 .  acetaminophen (TYLENOL) tablet 650 mg, 650 mg, Oral, Q6H PRN, Annita Brod, MD, 650 mg at 12/24/15 2128 .  albuterol (PROVENTIL) (2.5 MG/3ML) 0.083% nebulizer solution 2.5 mg, 2.5 mg, Nebulization, Q6H PRN, Annita Brod, MD, 2.5 mg at 12/27/15 0830 .  collagenase (SANTYL) ointment, , Topical, Daily, Nishant Dhungel, MD .  dextrose 5 % and 0.9 % NaCl with KCl 40 mEq/L infusion, , Intravenous, Continuous, Mariel Aloe, MD, Last Rate: 100 mL/hr at 12/29/15 0839 .  divalproex (DEPAKOTE ER) 24 hr tablet 500 mg, 500 mg, Oral, Q breakfast, Mariel Aloe, MD, 500 mg at 12/29/15 1231 .  docusate sodium (COLACE) capsule 100 mg, 100 mg, Oral, BID, [name], MD, 100 mg at 12/29/15 1000 .  dorzolamide-timolol (COSOPT) 22.3-6.8 MG/ML ophthalmic solution 1 drop, 1 drop, Both Eyes, BID, Annita Brod, MD, 1 drop at 12/29/15 1000 .  enoxaparin (LOVENOX) injection 30 mg, 30 mg, Subcutaneous, Q24H, [name], MD, 30 mg at 12/29/15 0800 .  famotidine (PEPCID) tablet 10 mg, 10 mg, Oral, BID, Nishant Dhungel, MD, 10 mg at 12/29/15 1234 .  feeding supplement (ENSURE ENLIVE) (ENSURE ENLIVE) liquid 237 mL, 237 mL, Oral, TID WC, Theodis Blaze, MD, 237 mL at 12/29/15 1700 .  fluconazole (DIFLUCAN) tablet 100 mg, 100 mg, Oral, Daily, Mariel Aloe, MD, 100 mg  at 12/29/15 1230 .  fludrocortisone (FLORINEF) tablet 0.1 mg, 0.1 mg, Oral, q morning - 10a, Annita Brod, MD, 0.1 mg at 12/29/15 1000 .  latanoprost (XALATAN) 0.005 % ophthalmic solution 1 drop, 1 drop, Both Eyes, QHS, Annita Brod, MD, 1 drop at 12/28/15 2315 .  magic mouthwash, 10 mL, Oral, TID PRN, Theodis Blaze, MD .  metoprolol tartrate (LOPRESSOR) tablet 25 mg, 25 mg, Oral, BID, Nishant Dhungel, MD, 25 mg at 12/29/15 1231 .  ondansetron (ZOFRAN) tablet 4 mg, 4 mg, Oral, Q6H PRN **OR** ondansetron (ZOFRAN) injection 4 mg, 4 mg, Intravenous, Q6H PRN, [name], MD .  pantoprazole (PROTONIX) EC tablet 40 mg, 40 mg, Oral, Q0600, Nishant Dhungel, MD, 40 mg at 12/29/15 0622 .  phenol (CHLORASEPTIC) mouth spray 1 spray, 1 spray, Mouth/Throat, PRN, Theodis Blaze, MD .  polyethylene glycol (MIRALAX / GLYCOLAX) packet 17 g, 17 g, Oral, Daily, Annita Brod, MD, 17 g at 12/29/15 1000 .  polyethylene glycol (MIRALAX / GLYCOLAX) packet 17 g, 17 g, Oral, Daily PRN, [name], MD .  senna [facility]) tablet 17.2 mg, 2 tablet, Oral, BID PRN, Nishant Dhungel, MD .  traMADol (ULTRAM) tablet 50 mg, 50 mg, Oral, Q6H PRN, Theodis Blaze, MD, 50 mg at 12/28/15 1158  Description: This is a routine EEG performed using standard international 10-20 electrode placement. A total of 18 channels are recorded, including one for the EKG.  Activating Maneuvers: none  Findings:  The EKG channel demonstrates a regular rhythm with  a rate of 90 beats per minute.   She is asleep throughout the majority of the recording. Her sleep consists predominantly of theta activity with average frequency is 5-6 hertz. There is no significant spindle formation with rare vertex sharp waves recorded. This is most consistent with early stages of sleep, predominantly stage I.   Ear the end of the recording, she does open her eyes but was noted by the tech not to be fixating or following commands. The  transition from sleep to wakefulness is unremarkable. With wakefulness, there is a slight increase in her background frequencies. At best, these increased to about 7 Hz but this is poorly sustained. Average background continues to be around 6 Hz. Her background is mildly disorganized. No normal alpha is recorded.  There are no focal asymmetries. No epileptiform discharges are present. No seizures are recorded.     Impression: This is an abnormal awake and asleep EEG. Sleep is limited to stage I with essentially normal architecture, though it is slightly disorganized.   Wakefulness is more abnormal with mild diffuse generalized slowing. This is consistent with a global encephalopathic process, nonspecific as to etiology. This pattern may be seen with metabolic derangements, medication effect, and neurodegenerative disorders including dementia.   There is no evidence of seizure or any propensity for seizure on this study.   Melba Coon, MD [facility]

## 2015-12-30 DIAGNOSIS — Z9189 Other specified personal risk factors, not elsewhere classified: Secondary | ICD-10-CM

## 2015-12-30 DIAGNOSIS — B370 Candidal stomatitis: Secondary | ICD-10-CM

## 2015-12-30 MED ORDER — FLUCONAZOLE 100 MG PO TABS: 100.0000 mg | ORAL_TABLET | Freq: Every day | ORAL | Status: DC

## 2015-12-30 MED ORDER — ENOXAPARIN SODIUM 40 MG/0.4ML ~~LOC~~ SOLN: 30.0000 mg | SUBCUTANEOUS | Status: DC

## 2015-12-30 MED ORDER — ENOXAPARIN SODIUM 40 MG/0.4ML ~~LOC~~ SOLN: 30.0000 mg | SUBCUTANEOUS | 0 refills | Status: DC

## 2015-12-30 MED ORDER — TRAMADOL HCL 50 MG PO TABS: 50.0000 mg | ORAL_TABLET | Freq: Four times a day (QID) | ORAL | 0 refills | Status: DC | PRN

## 2015-12-30 MED ORDER — DOCUSATE SODIUM 50 MG/5ML PO LIQD
100.0000 mg | Freq: Two times a day (BID) | ORAL | Status: DC
  Administered 2015-12-31: 100 mg via ORAL
  Filled 2015-12-30 (×2): qty 10

## 2015-12-30 NOTE — Progress Notes (Signed)
Occupational Therapy Treatment Patient Details Name: Taylor Roth MRN: [id number] DOB: May 31, 1928 Today's Date: 12/30/2015    History of present illness Pt is a 82 y.o. female with medical history significant of benzo/narcotics abuse, anxiety, depression, CKD, COPD, GERD, migraines, osteoporosis, scoliosis, substance abuse, vertigo admitted after fall.  Pt s/p R THA with posterior precautions   OT comments  Pt making progress towards OT goals this session. More alert and verbal, and willing to participate in seated grooming tasks as well as transfer to [facility]. Pt enjoyed listening to [employer] during session as motivator. Pt continues to benefit from skilled OT in the acute care setting prior to d/c to SNF.  Follow Up Recommendations  SNF;Supervision/Assistance - 24 hour    Equipment Recommendations  None recommended by OT (defer to next venue of care)    Recommendations for Other Services      Precautions / Restrictions Precautions Precautions: Posterior Hip;Fall Precaution Booklet Issued: Yes (comment) Precaution Comments: previously issued, reviewed with daughter Restrictions Weight Bearing Restrictions: Yes RLE Weight Bearing: Weight bearing as tolerated       Mobility Bed Mobility Overal bed mobility: Needs Assistance;+2 for physical assistance Bed Mobility: Supine to Sit     Supine to sit: Max assist;+2 for physical assistance     General bed mobility comments: assist for all aspects of bed mobility; cues for sequencing and technique  Transfers Overall transfer level: Needs assistance   Transfers: Stand Pivot Transfers;Sit to/from Stand Sit to Stand: Max assist;Total assist;+2 physical assistance Stand pivot transfers: Max assist;Total assist;+2 physical assistance       General transfer comment: max to total A +2 for all OOB transfers; pt required more assist with fatigue; max cues for hand placement and technique; pt followed commands inconsistently      Balance Overall balance assessment: Needs assistance Sitting-balance support: Bilateral upper extremity supported;Feet supported Sitting balance-Leahy Scale: Poor Sitting balance - Comments: Able to sit unassisted at EOB after support for trunk   Standing balance support: During functional activity Standing balance-Leahy Scale: Zero                     ADL Overall ADL's : Needs assistance/impaired     Grooming: Wash/dry face;Set up;Sitting Grooming Details (indicate cue type and reason): while sitting on [facility]                 Toilet Transfer: +2 for physical assistance;BSC;Stand-pivot;Maximal assistance;Total assistance Toilet Transfer Details (indicate cue type and reason): Pt grabbed onto seated surface and will hold on verbal cues requires and physical assist for her to let go to allow transfer [place] and Hygiene: Total assistance Toileting - Clothing Manipulation Details (indicate cue type and reason): total assist for peri care after bowel movement       General ADL Comments: Pt more awake today and participated in seated ADL while on BSC, likes [employer] radio      Environmental education officer      Cognition   Behavior During Therapy: Flat affect Overall Cognitive Status: Difficult to assess Area of Impairment: Following commands        Following Commands: Follows one step commands inconsistently;Follows one step commands with increased time            Extremity/Trunk Assessment  Exercises     Shoulder Instructions       General Comments      Pertinent Vitals/ Pain       Pain Assessment: Faces Faces Pain Scale: Hurts little more Pain Location: R hip Pain Descriptors / Indicators: Grimacing;Guarding;Sore Pain Intervention(s): Limited activity within patient's tolerance;Repositioned;Premedicated before session;Monitored during session  Home Living                                           Prior Functioning/Environment              Frequency  Min 2X/week        Progress Toward Goals  OT Goals(current goals can now be found in the care plan section)  Progress towards OT goals: Progressing toward goals  Acute Rehab OT Goals Patient Stated Goal: none stated  Plan Discharge plan remains appropriate    Co-evaluation    PT/OT/SLP Co-Evaluation/Treatment: Yes Reason for Co-Treatment: For patient/therapist safety PT goals addressed during session: Mobility/safety with mobility OT goals addressed during session: ADL's and self-care      End of Session Equipment Utilized During Treatment: Gait belt   Activity Tolerance Patient limited by lethargy   Patient Left in chair;with call bell/phone within reach;with family/visitor present;Other (comment)   Nurse Communication Mobility status;Other (comment)        Time: 1121-1200 OT Time Calculation (min): 39 min  Charges: OT General Charges $OT Visit: 1 Procedure OT Treatments $Self Care/Home Management : 8-22 mins  Merri Ray Cordaryl Decelles 12/30/2015, 3:25 PM  Hulda Humphrey OTR/L [phone]

## 2015-12-30 NOTE — Care Management Important Message (Signed)
Important Message  Patient Details  Name: Taylor Roth MRN: [id number] Date of Birth: 10/10/1928   Medicare Important Message Given:  Yes    Lakendrick Paradis [address] 12/30/2015, 3:14 PM

## 2015-12-30 NOTE — Progress Notes (Signed)
Physical Therapy Treatment Patient Details Name: Taylor Roth MRN: [id number] DOB: 1928/06/10 Today's Date: 12/30/2015    History of Present Illness Pt is a 82 y.o. female with medical history significant of benzo/narcotics abuse, anxiety, depression, CKD, COPD, GERD, migraines, osteoporosis, scoliosis, substance abuse, vertigo admitted after fall.  Pt s/p R THA with posterior precautions    PT Comments    Patient more alert today than previous PT sessions. Continues to require +2 assist for all mobility. Current plan remains appropriate.   Follow Up Recommendations  SNF;Supervision/Assistance - 24 hour     Equipment Recommendations  None recommended by PT    Recommendations for Other Services       Precautions / Restrictions Precautions Precautions: Posterior Hip;Fall Restrictions Weight Bearing Restrictions: Yes RLE Weight Bearing: Weight bearing as tolerated    Mobility  Bed Mobility Overal bed mobility: Needs Assistance;+2 for physical assistance Bed Mobility: Supine to Sit     Supine to sit: Max assist;+2 for physical assistance     General bed mobility comments: assist for all aspects of bed mobility; cues for sequencing and technique  Transfers Overall transfer level: Needs assistance   Transfers: Sit to/from Stand;Stand Pivot Transfers Sit to Stand: Max assist;Total assist;+2 physical assistance Stand pivot transfers: Max assist;Total assist;+2 physical assistance       General transfer comment: max to total A +2 for all OOB transfers; pt required more assist with fatigue; max cues for hand placement and technique; pt followed commands inconsistently   Ambulation/Gait                 Stairs            Wheelchair Mobility    Modified Rankin (Stroke Patients Only)       Balance     Sitting balance-Leahy Scale: Poor       Standing balance-Leahy Scale: Zero                      Cognition Arousal/Alertness:  Awake/alert (still a little lethargic at times but much more alert today) Behavior During Therapy: Flat affect Overall Cognitive Status: Difficult to assess Area of Impairment: Following commands       Following Commands: Follows one step commands inconsistently;Follows one step commands with increased time            Exercises      General Comments General comments (skin integrity, edema, etc.): daughter present for session      Pertinent Vitals/Pain Pain Assessment: Faces Faces Pain Scale: Hurts little more Pain Location: R hip Pain Descriptors / Indicators: Grimacing;Guarding;Sore Pain Intervention(s): Limited activity within patient's tolerance;Monitored during session;Premedicated before session;Repositioned    Home Living                      Prior Function            PT Goals (current goals can now be found in the care plan section) Acute Rehab PT Goals Patient Stated Goal: none stated Progress towards PT goals: Progressing toward goals    Frequency    Min 3X/week      PT Plan Current plan remains appropriate    Co-evaluation PT/OT/SLP Co-Evaluation/Treatment: Yes Reason for Co-Treatment: For patient/therapist safety PT goals addressed during session: Mobility/safety with mobility       End of Session Equipment Utilized During Treatment: Gait belt Activity Tolerance: Patient limited by fatigue Patient left: in chair;with call bell/phone within reach;with chair alarm set;with  family/visitor present     Time: 1121-1200 PT Time Calculation (min) (ACUTE ONLY): 39 min  Charges:  $Therapeutic Activity: 23-37 mins                    G Codes:      Salina April, PTA Pager: [phone]   12/30/2015, 1:03 PM

## 2015-12-30 NOTE — Discharge Summary (Signed)
Physician Discharge Summary  Taylor Roth EHU:[id number] DOB: 20-May-1928 DOA: 12/19/2015  PCP: Pcp Not In System  Admit date: 12/19/2015 Discharge date: 12/30/2015  Admitted From: SNF Disposition:  SNF  Recommendations for Outpatient Follow-up:  1. Reconsider parenteral feeding. Patient and daughter declined before discharge 2. Recommend patient to follow-up with a neurologist if no mental status improvement in 3-4 weeks 3. Aspiration precautions as patient is high risk for aspiration 4. Follow-up with orthopedic surgery   Discharge Condition: Guarded CODE STATUS: DNR Diet recommendation: No safe diet recommendation as patient is high risk. Recommend not to thicken since high risk for both thin and thickened liquid.   Brief/Interim Summary:  HPI written by Dr. Gevena Barre on 12/19/2015  HPI: Taylor Roth is a 82 y.o. female with medical history significant of bipolar disorder,? Dementia and hypertension who was recently discharged from the hospitalist practice for pneumonia and COPD exacerbation and was discharged to a skilled nursing facility. (Patient previously had been in an assisted living facility for several years).  Following admission to the skilled nursing facility last week, patient reportedly was starting to get up with physical therapy and using a walker. She sustained an unwitnessed fall on the night of 11/3 or morning of 11/4. Since then, she spent more time in bed. Patient's daughter today noted that she was much more lethargic and slightly confused. Her skilled nursing facility ordered right hip x-rays which reportedly showed a hip fracture. Hospitalists were contacted for direct admission. After arrival to floor, patient herself was somewhat lethargic although she could easily awakened. She was more confused which according to the patient's daughter is not her baseline, but her baseline being established back when she was at her assisted living facility several  weeks ago. The patient's daughter states that she's not been the same since.    Hospital course:  Closed right hip fracture S/p open treatment of femoral fracture, proximal end, neck, prosthetic replacement. Analgesics. Follow-up outpatient in two weeks.  Acute metabolic encephalopathy Patient presented with acute encephalopathy, however, this was present even three weeks prior. Workup was performed on previous admission and was thought to be secondary to pneumonia. On this admission, patient's mental status waxed and waned but generally improved slightly. EEG was significant for no seizure activity with evidence of global encephalopathy. Discussed with Dr. Shon Hale, Neurology, who agreed that this is likely secondary to prolonged delirium from recent infections and anesthesia. Recommendations are to monitor for improvement and follow-up with neurology outpatient. Discussed with daughter that patient's poor PO intake places her at high risk for death secondary to inanition, however, they have declined parenteral nutrition options.  Aspiration pneumonia, RLL Recurrent. Patient started on Levaquin. Speech therapy evaluated and patient high risk for aspiration. No safe oral regimen could be recommended. Discussed this with patient and daughter and they declined initiation of NG or G-tube after risks and benefits were explained. No oxygen requirement. Completed antibiotics.  Oral thrush Started on Diflucan for 14 days  Hypernatremia Occurred on only one lab draw. No intervention.  Hypokalemia Improved with repletion  Protein-calorie malnutrition, severe Severe aspiration risk.  CKD, stage III Stable. Creatinine normal, however, GFR calculation of 53 mL/min  Decubitus ulcer of hip, stage 3 Wound care recommendations: Santyl  Depression Bipolar disorder Continued Depakote   Discharge Diagnoses:  Principal Problem:   Closed right hip fracture, initial encounter ([place]) Active  Problems:   COPD (chronic obstructive pulmonary disease) (HCC)   Osteoporosis   Glaucoma   Protein-calorie malnutrition,  severe ([place])   CKD (chronic kidney disease), stage III   Encephalopathy   Decubitus ulcer of hip, stage 3 (HCC)    Discharge Instructions  Discharge Instructions    Weight bearing as tolerated    Complete by:  As directed        Medication List    STOP taking these medications   aspirin EC 81 MG tablet   Biotin 5000 MCG Caps   cefPROZIL 500 MG tablet Commonly known as:  CEFZIL   clorazepate 3.75 MG tablet Commonly known as:  TRANXENE   HYDROcodone-acetaminophen 7.5-325 MG tablet Commonly known as:  NORCO   hydrocortisone 2.5 % rectal cream Commonly known as:  ANUSOL-HC   mirtazapine 45 MG tablet Commonly known as:  REMERON   nystatin 100000 UNIT/ML suspension Commonly known as:  MYCOSTATIN   potassium chloride SA 20 MEQ tablet Commonly known as:  K-DUR,KLOR-CON   QUEtiapine Fumarate 150 MG 24 hr tablet Commonly known as:  SEROQUEL XR     TAKE these medications   acetaminophen 325 MG tablet Commonly known as:  TYLENOL Take 650 mg by mouth every 6 (six) hours as needed for headache.   albuterol 108 (90 Base) MCG/ACT inhaler Commonly known as:  PROVENTIL HFA;VENTOLIN HFA Inhale 1 puff into the lungs every 6 (six) hours as needed for shortness of breath.   ASPERCREME 10 % Lotn Generic drug:  Trolamine Salicylate Apply 1 application topically 2 (two) times daily as needed (for painful areas).   collagenase ointment Commonly known as:  SANTYL Apply topically daily.   Cranberry 425 MG Caps Take 425 mg by mouth 2 (two) times daily.   divalproex 500 MG 24 hr tablet Commonly known as:  DEPAKOTE ER Take 500 mg by mouth daily with breakfast.   docusate sodium 100 MG capsule Commonly known as:  COLACE Take 100 mg by mouth 2 (two) times daily.   dorzolamide-timolol 22.3-6.8 MG/ML ophthalmic solution Commonly known as:  COSOPT Place  1 drop into both eyes 2 (two) times daily.   enoxaparin 40 MG/0.4ML injection Commonly known as:  LOVENOX Inject 0.3 mLs (30 mg total) into the skin daily. Last dose 01/11/2016 Start taking on:  12/31/2015   feeding supplement (ENSURE ENLIVE) Liqd Take 237 mLs by mouth 2 (two) times daily between meals.   fluconazole 100 MG tablet Commonly known as:  DIFLUCAN Take 1 tablet (100 mg total) by mouth daily. Last dose 01/07/2016 Start taking on:  12/31/2015   fludrocortisone 0.1 MG tablet Commonly known as:  FLORINEF Take 0.1 mg by mouth every morning.   lactulose 10 GM/15ML solution Commonly known as:  CHRONULAC Take 15 mLs (10 g total) by mouth daily as needed for severe constipation.   lidocaine 5 % Commonly known as:  LIDODERM Place 1 patch onto the skin every 12 (twelve) hours as needed (for pain). Remove & Discard patch within 12 hours or as directed by MD   metoprolol tartrate 25 MG tablet Commonly known as:  LOPRESSOR Take 25 mg by mouth 2 (two) times daily.   nitroGLYCERIN 0.4 MG SL tablet Commonly known as:  NITROSTAT Place 0.4 mg under the tongue every 5 (five) minutes as needed for chest pain. May use 3 times   pantoprazole 40 MG tablet Commonly known as:  PROTONIX Take 1 tablet (40 mg total) by mouth daily at 6 (six) AM.   polyethylene glycol packet Commonly known as:  MIRALAX / GLYCOLAX Take 17 g by mouth daily.   ranitidine 150  MG tablet Commonly known as:  ZANTAC Take 150 mg by mouth 2 (two) times daily as needed for heartburn.   senna 8.6 MG tablet Commonly known as:  SENOKOT Take 2 tablets by mouth 2 (two) times daily as needed for constipation.   traMADol 50 MG tablet Commonly known as:  ULTRAM Take 1 tablet (50 mg total) by mouth every 6 (six) hours as needed for moderate pain or severe pain.   travoprost (benzalkonium) 0.004 % ophthalmic solution Commonly known as:  TRAVATAN Place 1 drop into both eyes at bedtime.   Vitamin D 1000 units  capsule Take 1,000 Units by mouth daily with breakfast.       Contact information for follow-up providers    [name], MD. Schedule an appointment as soon as possible for a visit in 2 week(s).   Specialty:  Orthopedic Surgery Why:  For suture removal, For wound re-check Contact information: [address] [place] [address] [phone]            Contact information for after-discharge care    Destination    [facility] Follow up.   Specialty:  [place] information: [place] [phone]                 Allergies  Allergen Reactions  . Macrobid [Nitrofurantoin Macrocrystal] Rash  . Azithromycin Other (See Comments)    unknown  . Doxycycline Other (See Comments)    unknown  . Escitalopram Oxalate Other (See Comments)    unknown  . Fioricet [Butalbital-Apap-Caffeine] Other (See Comments)    Drunk.  . Latex Other (See Comments)    unknown  . Levofloxacin Other (See Comments)    unknown  . Nsaids Other (See Comments)    This is not an allergy. The patient was told by Dr. Rockne Menghini to avoid NSAIDs and stop Vicoprofen in to 2014 to avoid nephrotoxicity.   Allergic reaction not listed on MAR.  Marland Kitchen Oxycodone Other (See Comments)    reaction to synthetic codeine  . Restasis [Cyclosporine] Other (See Comments)    unknown  . Sulfa Antibiotics Other (See Comments)    Reaction unknown  . Biaxin [Clarithromycin] Rash    With burning sensation    Consultations:  Orthopedic surgery   Procedures/Studies: Dg Chest 1 View  Result Date: 12/19/2015 CLINICAL DATA:  Hip fracture, fall EXAM: CHEST 1 VIEW COMPARISON:  12/15/2015 FINDINGS: Semi-erect view of the chest demonstrates mild hazy bibasilar opacity, probable atelectasis. No large effusion. Cardiomediastinal silhouette stable with atherosclerosis. No pneumothorax. Skin fold artifact over the left chest.  IMPRESSION: Hazy bibasilar atelectasis. Atherosclerosis of the aorta Electronically Signed   By: Donavan Foil M.D.   On: 12/19/2015 18:11   Ct Head Wo Contrast  Result Date: 12/11/2015 CLINICAL DATA:  Altered mental status. EXAM: CT HEAD WITHOUT CONTRAST TECHNIQUE: Contiguous axial images were obtained from the base of the skull through the vertex without intravenous contrast. COMPARISON:  CT scan of November 23, 2015. FINDINGS: Brain: Mild chronic ischemic white matter disease is noted. No mass effect or midline shift is noted. Ventricular size is within normal limits. There is no evidence of mass lesion, hemorrhage or acute infarction. Vascular: Atherosclerosis of carotid siphons is noted. Skull: Bony calvarium appears intact. Sinuses/Orbits: Paranasal sinuses appear normal. Other: None. IMPRESSION: Mild chronic ischemic white matter disease. No acute intracranial abnormality seen. Electronically Signed   By: Marijo Conception, M.D.  On: 12/11/2015 20:09   Ct Angio Chest Pe W Or Wo Contrast  Result Date: 12/23/2015 CLINICAL DATA:  F/U on fever. H/O dysphagia, COPD, pneumonia. Pt was very feisty, kinked up for scan. Pt tried to pull iv in the middle of scan causing extravasation with the kinky posture. Scan repeated and with new iv. 70 ML ISOVUE 370 and 60 .*comment was truncated* EXAM: CT ANGIOGRAPHY CHEST WITH CONTRAST TECHNIQUE: Multidetector CT imaging of the chest was performed using the standard protocol during bolus administration of intravenous contrast. Multiplanar CT image reconstructions and MIPs were obtained to evaluate the vascular anatomy. CONTRAST:  130 cc Isovue COMPARISON:  None FINDINGS: Cardiovascular: No filling defects within pulmonary to suggest acute pulmonary embolism. No pericardial fluid. No acute findings aorta great vessels. Mediastinum/Nodes: No axillary or supraclavicular lymphadenopathy. No mediastinal hilar lymphadenopathy. No pericardial fluid. Lungs/Pleura: Bilateral  small effusions. There is mild airspace disease in the RIGHT lower lobe. Centrilobular emphysema in the upper lobes. Upper Abdomen: Limited view of the liver, kidneys, pancreas are unremarkable. Normal adrenal glands. Musculoskeletal: Kyphoscoliosis.  No acute findings Review of the MIP images confirms the above findings. IMPRESSION: 1. No evidence acute pulmonary embolism. 2. Small bilateral pleural effusions and mild interstitial edema. 3. Mild airspace disease in the RIGHT lower lobe. Differential includes aspiration pneumonitis, pneumonia, or asymmetric edema. Electronically Signed   By: Suzy Bouchard M.D.   On: 12/23/2015 20:33   Dg Pelvis Portable  Result Date: 12/21/2015 CLINICAL DATA:  Right hip replacement EXAM: PORTABLE PELVIS 1-2 VIEWS COMPARISON:  12/19/2015 FINDINGS: AP portable supine view of the pelvis. Only the lower pelvis is included. The patient is status post right hip replacement with satisfactory alignment. Soft tissue staples laterally. Small amount of soft tissue gas, postoperative. IMPRESSION: A interim right hip replacement with satisfactory alignment. Electronically Signed   By: Donavan Foil M.D.   On: 12/21/2015 20:11   Dg Chest Port 1 View  Result Date: 12/22/2015 CLINICAL DATA:  Fevers EXAM: PORTABLE CHEST 1 VIEW COMPARISON:  12/19/2015 FINDINGS: Increasing right-sided pleural effusion is noted. The cardiac shadow is stable. No focal infiltrate is seen. No bony abnormality is noted. IMPRESSION: Slight increase in right-sided pleural effusion. No other focal abnormality is noted. Electronically Signed   By: Inez Catalina M.D.   On: 12/22/2015 14:01   Dg Chest Port 1 View  Result Date: 12/15/2015 CLINICAL DATA:  82 year old female with pneumonia. Subsequent encounter. EXAM: PORTABLE CHEST 1 VIEW COMPARISON:  12/11/2015, 08/12/2015 and 01/26/2015. FINDINGS: Slightly asymmetric airspace disease greater on the right which may represent a mild pulmonary edema. No segmental  consolidation. Prominent skin fold on left without pneumothorax. Calcified tortuous aorta. Heart size within normal limits. No plain film evidence of pulmonary malignancy. Shoulder joint degenerative changes and scoliosis thoracic spine. IMPRESSION: Findings suggestive of mild pulmonary edema. Electronically Signed   By: Genia Del M.D.   On: 12/15/2015 13:08   Dg Chest Port 1 View  Result Date: 12/11/2015 CLINICAL DATA:  Back pain. EXAM: PORTABLE CHEST 1 VIEW COMPARISON:  August 12, 2015 FINDINGS: The heart size borderline. The hila and mediastinum are unchanged. No pulmonary nodules or masses. Mild interstitial prominence in the right chest, new in the interval. No other acute abnormalities. IMPRESSION: Mild interstitial prominence in the right lung, asymmetric to the left could represent asymmetric edema or an atypical infection. Recommend clinical correlation and follow-up to resolution. Electronically Signed   By: Dorise Bullion III M.D   On: 12/11/2015 16:16  Dg Swallowing Func-speech Pathology  Result Date: 12/27/2015 Objective Swallowing Evaluation: Type of Study: MBS-Modified Barium Swallow Study Patient Details Name: Taylor Roth MRN: [id number] Date of Birth: 10-21-1928 Today's Date: 12/27/2015 Time: SLP Start Time (ACUTE ONLY): 1330-SLP Stop Time (ACUTE ONLY): 1400 SLP Time Calculation (min) (ACUTE ONLY): 30 min Past Medical History: Past Medical History: Diagnosis Date . Abuse   benzos/narcotics . Anxiety  . Arterial tortuosity (aorta) 12/11/2012 . Asthma  . Chronic kidney disease, stage IV (severe) ([place]) 12/11/2012 . Chronic mental illness  . Colitis  . COPD (chronic obstructive pulmonary disease) ([place])  . Depression  . Dysphagia 01/11/2015 . GERD (gastroesophageal reflux disease)  . Insomnia, unspecified  . Migraines  . Nasal bone fracture 03/30/2014 . Osteoporosis  . Schatzki's ring 01/14/2015 . Scoliosis  . Substance abuse   Benzos and Narcotics, she does not get any of those medicines  from [facility], we have empahtically told her that.  . Vertigo  . Vitamin B deficiency  . Weight loss  Past Surgical History: Past Surgical History: Procedure Laterality Date . ABDOMINAL HYSTERECTOMY   . CATARACT EXTRACTION   . CHOLECYSTECTOMY   . ESOPHAGOGASTRODUODENOSCOPY (EGD) WITH PROPOFOL N/A 01/13/2015  Procedure: ESOPHAGOGASTRODUODENOSCOPY (EGD) WITH PROPOFOL;  Surgeon: [name], MD;  Location: [facility];  Service: Endoscopy;  Laterality: N/A; . HIP ARTHROPLASTY Right 12/21/2015  Procedure: ARTHROPLASTY BIPOLAR HIP (HEMIARTHROPLASTY);  Surgeon: [name], MD;  Location: [place];  Service: Orthopedics;  Laterality: Right; . PARTIAL HYSTERECTOMY   HPI: Taylor Roth is a 82 y.o. female with medical history significant of benzo/narcotics abuse, anxiety, depression, CKD, COPD, GERD, migraines, osteoporosis, scoliosis, substance abuse, vertigo admitted after fall.  Found to have a hip fx and plans are for surgery.  Pt has been admitted recently with pna per her daughter and has baseline esophageal dysphagia with prior food impaction per chart review.  Esophagram 01/18/2015 done showed poor primary esophageal stripping wave and tertiary contractions and a prominent kink of mid and distal thoracic esophagus resulting in lodged barium tablet that did not clear despite warm water ingestion over 10 minutes.  Per family within the last weeks, pt has needed someone to feed her at her facility.  Subjective: Pt lethargic, but alerted with verbal/tactile stim. Assessment / Plan / Recommendation CHL IP CLINICAL IMPRESSIONS 12/27/2015 Therapy Diagnosis Severe pharyngeal phase dysphagia;Suspected primary esophageal dysphagia;Moderate oral phase dysphagia Clinical Impression Pt presents with severe oropharyngeal dysphagia further complicated by esophageal dysphagia. Silent aspiration of thin and puree noted. No other consistencies were trialed secondary to worsening pharyngeal residue with items with increased  viscosity. Oral holding and pharyngeal pooling without pt awareness for multiple minutes prior to swallow response despite frequent verbal and tactile cueing to initiate swallow. No safe PO established. Discussed with physician. Will sign off at this time. Please reconsult if pt's status improves and reassessment is appropriate.  Impact on safety and function Severe aspiration risk   CHL IP TREATMENT RECOMMENDATION 12/27/2015 Treatment Recommendations Patient unable to participate in swallow therapy at this time   Prognosis 12/27/2015 Prognosis for Safe Diet Advancement Guarded Barriers to Reach Goals -- Barriers/Prognosis Comment -- CHL IP DIET RECOMMENDATION 12/27/2015 SLP Diet Recommendations (No Data) Liquid Administration via -- Medication Administration Crushed with puree Compensations Minimize environmental distractions;Slow rate;Small sips/bites;Follow solids with liquid Postural Changes Remain semi-upright after after feeds/meals (Comment);Seated upright at 90 degrees   CHL IP OTHER RECOMMENDATIONS 12/27/2015 Recommended Consults -- Oral Care Recommendations Oral care BID Other Recommendations --  CHL IP FOLLOW UP RECOMMENDATIONS 12/27/2015 Follow up Recommendations Skilled Nursing facility   [facility] IP FREQUENCY AND DURATION 12/20/2015 Speech Therapy Frequency (ACUTE ONLY) min 1 x/week Treatment Duration 1 week      CHL IP ORAL PHASE 12/27/2015 Oral Phase Impaired Oral - Pudding Teaspoon -- Oral - Pudding Cup -- Oral - Honey Teaspoon -- Oral - Honey Cup -- Oral - Nectar Teaspoon -- Oral - Nectar Cup -- Oral - Nectar Straw -- Oral - Thin Teaspoon -- Oral - Thin Cup -- Oral - Thin Straw -- Oral - Puree -- Oral - Mech Soft -- Oral - Regular -- Oral - Multi-Consistency -- Oral - Pill -- Oral Phase - Comment no attempts to masticate solids  CHL IP PHARYNGEAL PHASE 12/27/2015 Pharyngeal Phase Impaired Pharyngeal- Pudding Teaspoon -- Pharyngeal -- Pharyngeal- Pudding Cup -- Pharyngeal -- Pharyngeal- Honey Teaspoon --  Pharyngeal -- Pharyngeal- Honey Cup -- Pharyngeal -- Pharyngeal- Nectar Teaspoon -- Pharyngeal -- Pharyngeal- Nectar Cup -- Pharyngeal -- Pharyngeal- Nectar Straw -- Pharyngeal -- Pharyngeal- Thin Teaspoon Delayed swallow initiation-pyriform sinuses;Reduced epiglottic inversion;Reduced airway/laryngeal closure;Reduced pharyngeal peristalsis Pharyngeal -- Pharyngeal- Thin Cup Delayed swallow initiation-pyriform sinuses;Reduced airway/laryngeal closure;Reduced pharyngeal peristalsis;Reduced epiglottic inversion;Pharyngeal residue - valleculae;Pharyngeal residue - pyriform;Lateral channel residue Pharyngeal -- Pharyngeal- Thin Straw Delayed swallow initiation-pyriform sinuses;Reduced pharyngeal peristalsis;Reduced epiglottic inversion;Reduced airway/laryngeal closure;Penetration/Apiration after swallow;Moderate aspiration;Lateral channel residue;Pharyngeal residue - valleculae;Pharyngeal residue - pyriform;Pharyngeal residue - posterior pharnyx Pharyngeal Material enters airway, passes BELOW cords without attempt by patient to eject out (silent aspiration) Pharyngeal- Puree Delayed swallow initiation-pyriform sinuses;Pharyngeal residue - pyriform;Pharyngeal residue - posterior pharnyx;Moderate aspiration;Penetration/Apiration after swallow;Reduced airway/laryngeal closure;Lateral channel residue Pharyngeal Material enters airway, passes BELOW cords without attempt by patient to eject out (silent aspiration) Pharyngeal- Mechanical Soft -- Pharyngeal -- Pharyngeal- Regular -- Pharyngeal -- Pharyngeal- Multi-consistency -- Pharyngeal -- Pharyngeal- Pill Pharyngeal residue - pyriform Pharyngeal -- Pharyngeal Comment --  CHL IP CERVICAL ESOPHAGEAL PHASE 12/27/2015 Cervical Esophageal Phase Impaired Pudding Teaspoon -- Pudding Cup -- Honey Teaspoon -- Honey Cup -- Nectar Teaspoon -- Nectar Cup -- Nectar Straw -- Thin Teaspoon -- Thin Cup -- Thin Straw -- Puree Esophageal backflow into cervical esophagus Mechanical Soft --  Regular -- Multi-consistency -- Pill (No Data) Cervical Esophageal Comment -- CHL IP GO 12/10/2012 Functional Assessment Tool Used clinical judgement Functional Limitations Swallowing Swallow Current Status (U2353) CI Swallow Goal Status (I1443) [place] Swallow Discharge Status (X5400) (None) Motor Speech Current Status (Q6761) (None) Motor Speech Goal Status (P5093) (None) Motor Speech Goal Status (O6712) (None) Spoken Language Comprehension Current Status (W5809) (None) Spoken Language Comprehension Goal Status (X8338) (None) Spoken Language Comprehension Discharge Status (S5053) (None) Spoken Language Expression Current Status (Z7673) (None) Spoken Language Expression Goal Status (A1937) (None) Spoken Language Expression Discharge Status [phone]) (None) Attention Current Status (X7353) (None) Attention Goal Status (G9924) (None) Attention Discharge Status (Q6834) (None) Memory Current Status (H9622) (None) Memory Goal Status (W9798) (None) Memory Discharge Status (X2119) (None) Voice Current Status (E1740) (None) Voice Goal Status (C1448) (None) Voice Discharge Status (J8563) (None) Other Speech-Language Pathology Functional Limitation (J4970) (None) Other Speech-Language Pathology Functional Limitation Goal Status (Y6378) (None) Other Speech-Language Pathology Functional Limitation Discharge Status [phone]) (None) Vinetta Bergamo MA, [place] Pager [phone] 12/27/2015, 4:13 PM              Dg Hip Unilat With Pelvis 2-3 Views Right  Result Date: 12/19/2015 CLINICAL DATA:  Fall at nursing facility EXAM: DG HIP (WITH OR WITHOUT PELVIS) 2-3V RIGHT COMPARISON:  01/27/2015 FINDINGS: The left hip  demonstrates normal positioning. The pubic symphysis is intact. There is a mildly comminuted right femoral neck fracture with mild superior migration of the distal femur. The right femoral head remains seated within the acetabulum. There is possible underlying lucency within the superolateral femoral neck. There is mild varus  angulation. IMPRESSION: 1. Mildly comminuted, displaced and angulated right femoral neck fracture. Possible lucency within the underlying superolateral femoral neck, pathologic fracture cannot be excluded. 2. The left hip is within normal limits Electronically Signed   By: Donavan Foil M.D.   On: 12/19/2015 18:09      Subjective: Patient responds to voices and answers questions. Somnolent.  Discharge Exam: Vitals:   12/30/15 0618 12/30/15 1351  BP: (!) 143/59 (!) 157/86  Pulse: 77 88  Resp:    Temp: 97.8 F (36.6 C) 98 F (36.7 C)   Vitals:   12/29/15 1230 12/29/15 2038 12/30/15 0618 12/30/15 1351  BP: (!) 164/74 (!) 154/65 (!) 143/59 (!) 157/86  Pulse: 89 88 77 88  Resp:      Temp:  97.3 F (36.3 C) 97.8 F (36.6 C) 98 F (36.7 C)  TempSrc:  Axillary Axillary   SpO2:  96% 98% 91%  Weight:      Height:        General exam: Appears calm and comfortable. Respiratory system: Clear to auscultation. Respiratory effort normal. Cardiovascular system: S1 & S2 heard, RRR. No murmurs, rubs, gallops or clicks. Gastrointestinal system: Abdomen is nondistended, soft and nontender. Normal bowel sounds heard. Central nervous system: Alert. Extremities: No edema. No calf tenderness. Right hip tenderness Skin: No cyanosis. No rashes  The results of significant diagnostics from this hospitalization (including imaging, microbiology, ancillary and laboratory) are listed below for reference.     Microbiology: Recent Results (from the past 240 hour(s))  Surgical pcr screen     Status: None   Collection Time: 12/20/15 11:32 PM  Result Value Ref Range Status   MRSA, PCR NEGATIVE NEGATIVE Final   Staphylococcus aureus NEGATIVE NEGATIVE Final    Comment:        The Xpert SA Assay (FDA approved for NASAL specimens in patients over 66 years of age), is one component of a comprehensive surveillance program.  Test performance has been validated by [facility] for patients greater than  or equal to 33 year old. It is not intended to diagnose infection nor to guide or monitor treatment.   Culture, Urine     Status: None   Collection Time: 12/22/15  3:55 PM  Result Value Ref Range Status   Specimen Description URINE, CLEAN CATCH  Final   Special Requests NONE  Final   Culture NO GROWTH  Final   Report Status 12/23/2015 FINAL  Final  Culture, blood (x 2)     Status: None   Collection Time: 12/23/15  1:12 PM  Result Value Ref Range Status   Specimen Description BLOOD BLOOD RIGHT ARM  Final   Special Requests BOTTLES DRAWN AEROBIC ONLY [place]  Final   Culture NO GROWTH 5 DAYS  Final   Report Status 12/28/2015 FINAL  Final  Culture, blood (x 2)     Status: None   Collection Time: 12/23/15  1:17 PM  Result Value Ref Range Status   Specimen Description BLOOD BLOOD RIGHT HAND  Final   Special Requests IN PEDIATRIC BOTTLE [facility]  Final   Culture NO GROWTH 5 DAYS  Final   Report Status 12/28/2015 FINAL  Final     Labs:  BNP (last 3 results) No results for input(s): BNP in the last 8760 hours. Basic Metabolic Panel:  Recent Labs Lab 12/25/15 0358 12/26/15 0416 12/27/15 0346 12/28/15 0431 12/29/15 0515  NA 140 141 142 139 139  K 3.1* 2.7* 3.5 3.2* 3.8  CL 110 109 110 106 107  CO2 19* 22 23 23  21*  GLUCOSE 86 98 106* 110* 114*  BUN 15 8 6 12 10   CREATININE 0.64 0.49 0.50 0.60 0.55  CALCIUM 8.2* 8.1* 8.4* 8.3* 8.5*   Liver Function Tests: No results for input(s): AST, ALT, ALKPHOS, BILITOT, PROT, ALBUMIN in the last 168 hours. No results for input(s): LIPASE, AMYLASE in the last 168 hours. No results for input(s): AMMONIA in the last 168 hours. CBC:  Recent Labs Lab 12/24/15 0534 12/25/15 0358 12/26/15 0416 12/27/15 0346  WBC 9.2 8.2 5.7 5.7  HGB 8.1* 8.4* 8.3* 8.4*  HCT 25.8* 27.3* 25.9* 27.1*  MCV 90.8 91.0 89.0 88.6  PLT 269 331 357 406*   Cardiac Enzymes: No results for input(s): CKTOTAL, CKMB, CKMBINDEX, TROPONINI in the last 168  hours. BNP: Invalid input(s): POCBNP CBG: No results for input(s): GLUCAP in the last 168 hours. D-Dimer No results for input(s): DDIMER in the last 72 hours. Hgb A1c No results for input(s): HGBA1C in the last 72 hours. Lipid Profile No results for input(s): CHOL, HDL, LDLCALC, TRIG, CHOLHDL, LDLDIRECT in the last 72 hours. Thyroid function studies No results for input(s): TSH, T4TOTAL, T3FREE, THYROIDAB in the last 72 hours.  Invalid input(s): FREET3 Anemia work up No results for input(s): VITAMINB12, FOLATE, FERRITIN, TIBC, IRON, RETICCTPCT in the last 72 hours. Urinalysis    Component Value Date/Time   COLORURINE AMBER (A) 12/22/2015 1555   APPEARANCEUR CLEAR 12/22/2015 1555   LABSPEC 1.036 (H) 12/22/2015 1555   PHURINE 6.0 12/22/2015 1555   GLUCOSEU NEGATIVE 12/22/2015 1555   HGBUR NEGATIVE 12/22/2015 1555   BILIRUBINUR SMALL (A) 12/22/2015 1555   BILIRUBINUR neg 06/20/2013 1736   KETONESUR 15 (A) 12/22/2015 1555   PROTEINUR NEGATIVE 12/22/2015 1555   UROBILINOGEN 0.2 12/25/2014 1620   NITRITE NEGATIVE 12/22/2015 1555   LEUKOCYTESUR NEGATIVE 12/22/2015 1555   Sepsis Labs Invalid input(s): PROCALCITONIN,  WBC,  LACTICIDVEN Microbiology Recent Results (from the past 240 hour(s))  Surgical pcr screen     Status: None   Collection Time: 12/20/15 11:32 PM  Result Value Ref Range Status   MRSA, PCR NEGATIVE NEGATIVE Final   Staphylococcus aureus NEGATIVE NEGATIVE Final    Comment:        The Xpert SA Assay (FDA approved for NASAL specimens in patients over 21 years of age), is one component of a comprehensive surveillance program.  Test performance has been validated by [facility] for patients greater than or equal to 82 year old. It is not intended to diagnose infection nor to guide or monitor treatment.   Culture, Urine     Status: None   Collection Time: 12/22/15  3:55 PM  Result Value Ref Range Status   Specimen Description URINE, CLEAN CATCH  Final    Special Requests NONE  Final   Culture NO GROWTH  Final   Report Status 12/23/2015 FINAL  Final  Culture, blood (x 2)     Status: None   Collection Time: 12/23/15  1:12 PM  Result Value Ref Range Status   Specimen Description BLOOD BLOOD RIGHT ARM  Final   Special Requests BOTTLES DRAWN AEROBIC ONLY [place]  Final   Culture NO  GROWTH 5 DAYS  Final   Report Status 12/28/2015 FINAL  Final  Culture, blood (x 2)     Status: None   Collection Time: 12/23/15  1:17 PM  Result Value Ref Range Status   Specimen Description BLOOD BLOOD RIGHT HAND  Final   Special Requests IN PEDIATRIC BOTTLE [facility]  Final   Culture NO GROWTH 5 DAYS  Final   Report Status 12/28/2015 FINAL  Final     Time coordinating discharge: Over 30 minutes  SIGNED:   Cordelia Poche, MD [name] 12/30/2015, 2:36 PM Pager [phone]  If 7PM-7AM, please contact night-coverage [email or web address] Password [email or web address]

## 2015-12-31 MED ORDER — DEXTROSE-NACL 5-0.45 % IV SOLN
INTRAVENOUS | Status: DC
  Administered 2015-12-31: 10:00:00 via INTRAVENOUS

## 2015-12-31 NOTE — NC FL2 (Signed)
[place] MEDICAID FL2 LEVEL OF CARE SCREENING TOOL     IDENTIFICATION  Patient Name: Taylor Roth Birthdate: 1928-09-18 Sex: female Admission Date (Current Location): 12/19/2015  [facility] and [place] Number:  Herbalist and Address:  [facility] [place], [place] [address], [place], [place] [address]      Provider Number: [id number]  Attending Physician Name and Address:  Mariel Aloe, MD  Relative Name and Phone Number:       Current Level of Care: Hospital Recommended Level of Care: [place] Prior Approval Number:    Date Approved/Denied:   PASRR Number: [id number]  Discharge Plan: SNF    Current Diagnoses: Patient Active Problem List   Diagnosis Date Noted  . Closed right hip fracture, initial encounter ([place]) 12/19/2015  . Mood disorder ([place]) 12/17/2015  . Decubitus ulcer of hip, stage 3 ([place]) 12/11/2015  . Ankle pain   . Encephalopathy   . Malnutrition of moderate degree 01/29/2015  . Altered mental status 01/26/2015  . Nausea and vomiting 01/17/2015  . Esophageal dysmotilities   . Urinary tract infectious disease   . Weakness 01/14/2015  . Schatzki's ring 01/14/2015  . CKD (chronic kidney disease), stage III 01/14/2015  . Esophagus disorder   . Dysphagia 01/11/2015  . UTI (lower urinary tract infection) 03/30/2014  . Hearing loss, sensorineural 03/04/2014  . Nasal lesion 03/04/2014  . Unspecified constipation 12/12/2012  . Orthostatic hypotension 12/11/2012  . Arterial tortuosity (aorta) 12/11/2012  . Protein-calorie malnutrition, severe ([place]) 12/10/2012  . Decreased rectal sphincter tone 11/21/2012  . Idiopathic scoliosis 07/08/2012  . Macular degeneration 06/26/2011  . Vision disturbance 06/26/2011  . Glaucoma 06/26/2011  . Anemia of chronic disease 04/17/2011  . Drug-seeking behavior 02/26/2011  . Osteoporosis 02/26/2011  . Depression   . Anxiety   . COPD (chronic obstructive pulmonary disease)  ([place])   . GERD (gastroesophageal reflux disease)   . Migraines   . Insomnia, unspecified     Orientation RESPIRATION BLADDER Height & Weight     Self  Normal Incontinent Weight: 45.2 kg (99 lb 10.4 oz) Height:  4\' 11"  (149.9 cm)  BEHAVIORAL SYMPTOMS/MOOD NEUROLOGICAL BOWEL NUTRITION STATUS   (NONE)  (NONE) Incontinent Diet (Regular)  AMBULATORY STATUS COMMUNICATION OF NEEDS Skin   Extensive Assist Verbally PU Stage and Appropriate Care, Surgical wounds (Unstageable PU to left hip. Incision of right hip.)                       Personal Care Assistance Level of Assistance  Bathing, Feeding, Dressing Bathing Assistance: Limited assistance Feeding assistance: Independent Dressing Assistance: Limited assistance     Functional Limitations Info  Sight, Hearing, Speech Sight Info: Adequate Hearing Info: Adequate Speech Info: Adequate    SPECIAL CARE FACTORS FREQUENCY  PT (By licensed PT), OT (By licensed OT)     PT Frequency: 5/week OT Frequency: 5/week            Contractures Contractures Info: Not present    Additional Factors Info  Code Status, Allergies, Psychotropic Code Status Info: DNR Allergies Info: Macrobid Nitrofurantoin Macrocrystal, Azithromycin, Doxycycline, Escitalopram Oxalate, Fioricet Butalbital-apap-caffeine, Latex, Levofloxacin, Nsaids, Oxycodone, Restasis Cyclosporine, Sulfa Antibiotics, Biaxin Clarithromycin Psychotropic Info: Depakote XR         Current Medications (12/31/2015):  This is the current hospital active medication list Current Facility-Administered Medications  Medication Dose Route Frequency Provider Last Rate Last Dose  . acetaminophen (TYLENOL) tablet 650 mg  650  mg Oral Q6H PRN [name], MD   650 mg at 12/30/15 2305   Or  . acetaminophen (TYLENOL) suppository 650 mg  650 mg Rectal Q6H PRN [name], MD   650 mg at 12/24/15 0138  . acetaminophen (TYLENOL) tablet 650 mg  650 mg Oral Q6H PRN Annita Brod, MD   650 mg at 12/29/15 2118  . albuterol (PROVENTIL) (2.5 MG/3ML) 0.083% nebulizer solution 2.5 mg  2.5 mg Nebulization Q6H PRN Annita Brod, MD   2.5 mg at 12/27/15 0830  . collagenase (SANTYL) ointment   Topical Daily Nishant Dhungel, MD      . dextrose 5 %-0.45 % sodium chloride infusion   Intravenous Continuous Mariel Aloe, MD 100 mL/hr at 12/31/15 0949    . divalproex (DEPAKOTE ER) 24 hr tablet 500 mg  500 mg Oral Q breakfast Mariel Aloe, MD   500 mg at 12/30/15 0800  . docusate (COLACE) 50 MG/5ML liquid 100 mg  100 mg Oral BID Mariel Aloe, MD      . dorzolamide-timolol (COSOPT) 22.3-6.8 MG/ML ophthalmic solution 1 drop  1 drop Both Eyes BID Annita Brod, MD   1 drop at 12/30/15 2307  . enoxaparin (LOVENOX) injection 30 mg  30 mg Subcutaneous Q24H [name], MD   30 mg at 12/30/15 0800  . famotidine (PEPCID) tablet 10 mg  10 mg Oral BID Nishant Dhungel, MD   10 mg at 12/30/15 2308  . feeding supplement (ENSURE ENLIVE) (ENSURE ENLIVE) liquid 237 mL  237 mL Oral TID WC Theodis Blaze, MD   237 mL at 12/30/15 1700  . fluconazole (DIFLUCAN) tablet 100 mg  100 mg Oral Daily Mariel Aloe, MD   100 mg at 12/30/15 1000  . fludrocortisone (FLORINEF) tablet 0.1 mg  0.1 mg Oral q morning - 10a Annita Brod, MD   0.1 mg at 12/30/15 1000  . latanoprost (XALATAN) 0.005 % ophthalmic solution 1 drop  1 drop Both Eyes QHS Annita Brod, MD   1 drop at 12/30/15 2307  . magic mouthwash  10 mL Oral TID PRN Theodis Blaze, MD      . metoprolol tartrate (LOPRESSOR) tablet 25 mg  25 mg Oral BID Nishant Dhungel, MD   25 mg at 12/30/15 2307  . ondansetron (ZOFRAN) tablet 4 mg  4 mg Oral Q6H PRN [name], MD       Or  . ondansetron [facility]) injection 4 mg  4 mg Intravenous Q6H PRN [name], MD      . pantoprazole (PROTONIX) EC tablet 40 mg  40 mg Oral Q0600 Nishant Dhungel, MD   40 mg at 12/31/15 0659  . phenol (CHLORASEPTIC) mouth spray 1 spray   1 spray Mouth/Throat PRN Theodis Blaze, MD      . polyethylene glycol (MIRALAX / GLYCOLAX) packet 17 g  17 g Oral Daily Annita Brod, MD   17 g at 12/30/15 1000  . polyethylene glycol (MIRALAX / GLYCOLAX) packet 17 g  17 g Oral Daily PRN [name], MD      . senna [facility]) tablet 17.2 mg  2 tablet Oral BID PRN Nishant Dhungel, MD      . traMADol (ULTRAM) tablet 50 mg  50 mg Oral Q6H PRN Theodis Blaze, MD   50 mg at 12/28/15 1158     Discharge Medications: Please see discharge summary for a list of discharge  medications.  Relevant Imaging Results:  Relevant Lab Results:   Additional Information SSN # [id number]  Rigoberto Noel, LCSW

## 2015-12-31 NOTE — Progress Notes (Signed)
Patient somnolent but responds. She has some hip pain. No other complaints.  BP (!) 148/71 (BP Location: Right Arm)   Pulse 78   Temp 98 F (36.7 C) (Axillary)   Resp 16   Ht 4\' 11"  (1.499 m)   Wt 45.2 kg (99 lb 10.4 oz)   SpO2 98%   BMI 20.13 kg/m   General: appears frail. Easily arousable.  Patient ready for discharge to SNF. Mental status appears baseline. I feel some of this is likely secondary to patient's nutrition status. Again, parenteral nutrition was declined by patient and daughter. At this rate, do not suspect she will be able to sustain herself too much longer.  Cordelia Poche, MD [name] 12/31/2015, 11:49 AM Pager: [phone]

## 2015-12-31 NOTE — Clinical Social Work Note (Signed)
Patient returning back to SNF, [facility], facility prepared for admission.   DC packet on chart with number for report.   Medical Social Worker facilitated patient discharge including contacting patient family and facility to confirm patient discharge plans.  Clinical information faxed to facility and family agreeable with plan.  MSW arranged ambulance transport via [place] to [facility].  RN to call report prior to discharge.  Medical Social Worker will sign off for now as social work intervention is no longer needed. Please consult [place] again if new need arises.  Glendon Axe, MSW [phone] 12/31/2015 12:07 PM

## 2015-12-31 NOTE — Progress Notes (Signed)
Report called to Ander Purpura, LPN at [facility]

## 2016-01-02 ENCOUNTER — Non-Acute Institutional Stay (SKILLED_NURSING_FACILITY): Payer: Medicare Other | Admitting: Internal Medicine

## 2016-01-02 ENCOUNTER — Encounter: Payer: Self-pay | Admitting: Internal Medicine

## 2016-01-02 DIAGNOSIS — Z8781 Personal history of (healed) traumatic fracture: Secondary | ICD-10-CM

## 2016-01-02 DIAGNOSIS — E876 Hypokalemia: Secondary | ICD-10-CM

## 2016-01-02 DIAGNOSIS — J690 Pneumonitis due to inhalation of food and vomit: Secondary | ICD-10-CM | POA: Diagnosis not present

## 2016-01-02 DIAGNOSIS — S72001D Fracture of unspecified part of neck of right femur, subsequent encounter for closed fracture with routine healing: Secondary | ICD-10-CM | POA: Diagnosis not present

## 2016-01-02 DIAGNOSIS — L89223 Pressure ulcer of left hip, stage 3: Secondary | ICD-10-CM

## 2016-01-02 DIAGNOSIS — R1310 Dysphagia, unspecified: Secondary | ICD-10-CM | POA: Diagnosis not present

## 2016-01-02 DIAGNOSIS — N183 Chronic kidney disease, stage 3 unspecified: Secondary | ICD-10-CM

## 2016-01-02 DIAGNOSIS — Z9889 Other specified postprocedural states: Secondary | ICD-10-CM

## 2016-01-02 DIAGNOSIS — F319 Bipolar disorder, unspecified: Secondary | ICD-10-CM

## 2016-01-02 DIAGNOSIS — Z967 Presence of other bone and tendon implants: Secondary | ICD-10-CM | POA: Diagnosis not present

## 2016-01-02 DIAGNOSIS — E43 Unspecified severe protein-calorie malnutrition: Secondary | ICD-10-CM

## 2016-01-02 DIAGNOSIS — I10 Essential (primary) hypertension: Secondary | ICD-10-CM

## 2016-01-02 DIAGNOSIS — G9340 Encephalopathy, unspecified: Secondary | ICD-10-CM | POA: Diagnosis not present

## 2016-01-02 DIAGNOSIS — B370 Candidal stomatitis: Secondary | ICD-10-CM | POA: Diagnosis not present

## 2016-01-02 DIAGNOSIS — K219 Gastro-esophageal reflux disease without esophagitis: Secondary | ICD-10-CM | POA: Diagnosis not present

## 2016-01-02 NOTE — Progress Notes (Signed)
: Provider:  [name], MD Location:  [facility] Room Number: [phone] Place of Service:  SNF (31)  PCP: Pcp Not In System Patient Care Team: Pcp Not In System as PCP - General [name], MD (Gastroenterology) [name], NP (Obstetrics and Gynecology)  Extended Emergency Contact Information Primary Emergency Contact: [name],[name] Address: [place], [place] Phone: [phone] Mobile Phone: [phone] Relation: Daughter Secondary Emergency Contact: [name] Phone: [phone] Mobile Phone: [phone] Relation: Daughter     Allergies: Macrobid [nitrofurantoin macrocrystal]; Azithromycin; Doxycycline; Escitalopram oxalate; Fioricet [butalbital-apap-caffeine]; Latex; Levofloxacin; Nsaids; Oxycodone; Restasis [cyclosporine]; Sulfa antibiotics; and Biaxin [clarithromycin]  Chief Complaint  Patient presents with  . Readmit To SNF    Readmit to Facility    HPI: Patient is 82 y.o. female with bipolar disorder, Dementia and hypertension who was recently discharged from the hospitalist practice for pneumonia and COPD exacerbation and was discharged to a skilled nursing facility. Following admission to the skilled nursing facility last week, patient reportedly was starting to get up with physical therapy and using a walker. She sustained an unwitnessed fall on the night of 11/3 or morning of 11/4. Since then, she spent more time in bed. Patient's daughter noted that she was much more lethargic and slightly confused. Her skilled nursing facility ordered right hip x-rays which reportedly showed a hip fracture. Pt was admitted to [facility] from 11/6-17 where she underwent an ORIF. Hospital course was complicated by acute encephalopathy, a RLL aspiration PNA and oral thrush. Pt is admitted to SNF for OT/PT. While at SNF pt will be followed for HTN, tx with  lopressor, GERD, tx with protonix and bipolar dx, tx with depakote.   Past Medical History:  Diagnosis Date  . Anxiety   . Arterial tortuosity (aorta) 12/11/2012  . Asthma   . Chronic kidney disease, stage IV (severe) ([place]) 12/11/2012  . Chronic mental illness   . Colitis   . COPD (chronic obstructive pulmonary disease) ([place])   . Depression   . Dysphagia 01/11/2015  . GERD (gastroesophageal reflux disease)   . Insomnia, unspecified   . Migraines   . Nasal bone fracture 03/30/2014  . Osteoporosis   . Schatzki's ring 01/14/2015  . Scoliosis   . Substance abuse    Benbzos and Narcotics, she does not get any of those medicines from [facility], we have empathetically told her that.  . Vertigo   . Vitamin B deficiency   . Weight loss     Past Surgical History:  Procedure Laterality Date  . ABDOMINAL HYSTERECTOMY    . CATARACT EXTRACTION    . CHOLECYSTECTOMY    . ESOPHAGOGASTRODUODENOSCOPY (EGD) WITH PROPOFOL N/A 01/13/2015   Procedure: ESOPHAGOGASTRODUODENOSCOPY (EGD) WITH PROPOFOL;  Surgeon: [name], MD;  Location: [facility];  Service: Endoscopy;  Laterality: N/A;  . HIP ARTHROPLASTY Right 12/21/2015   Procedure: ARTHROPLASTY BIPOLAR HIP (HEMIARTHROPLASTY);  Surgeon: [name], MD;  Location: [facility];  Service: Orthopedics;  Laterality: Right;  . PARTIAL HYSTERECTOMY        Medication List       Accurate as of 01/02/16 10:29 AM. Always use your most recent med list.          acetaminophen 325 MG tablet Commonly known as:  TYLENOL Take 650 mg by mouth every 6 (six) hours as needed for headache.  albuterol 108 (90 Base) MCG/ACT inhaler Commonly known as:  PROVENTIL HFA;VENTOLIN HFA Inhale 1 puff into the lungs every 6 (six) hours as needed for shortness of breath.   ASPERCREME 10 % Lotn Generic drug:  Trolamine Salicylate Apply 1 application topically 2 (two) times daily as needed (for painful areas).   collagenase ointment Commonly known as:   SANTYL Apply topically daily.   Cranberry 425 MG Caps Take 425 mg by mouth 2 (two) times daily.   divalproex 500 MG 24 hr tablet Commonly known as:  DEPAKOTE ER Take 500 mg by mouth daily with breakfast.   docusate sodium 100 MG capsule Commonly known as:  COLACE Take 100 mg by mouth 2 (two) times daily.   dorzolamide-timolol 22.3-6.8 MG/ML ophthalmic solution Commonly known as:  COSOPT Place 1 drop into both eyes 2 (two) times daily.   enoxaparin 40 MG/0.4ML injection Commonly known as:  LOVENOX Inject 0.3 mLs (30 mg total) into the skin daily. Last dose 01/11/2016   feeding supplement (ENSURE ENLIVE) Liqd Take 237 mLs by mouth 2 (two) times daily between meals.   fluconazole 100 MG tablet Commonly known as:  DIFLUCAN Take 1 tablet (100 mg total) by mouth daily. Last dose 01/07/2016   fludrocortisone 0.1 MG tablet Commonly known as:  FLORINEF Take 0.1 mg by mouth every morning.   lactulose 10 GM/15ML solution Commonly known as:  CHRONULAC Take 15 mLs (10 g total) by mouth daily as needed for severe constipation.   lidocaine 5 % Commonly known as:  LIDODERM Place 1 patch onto the skin every 12 (twelve) hours as needed (for pain). Remove & Discard patch within 12 hours or as directed by MD   metoprolol tartrate 25 MG tablet Commonly known as:  LOPRESSOR Take 25 mg by mouth 2 (two) times daily.   nitroGLYCERIN 0.4 MG SL tablet Commonly known as:  NITROSTAT Place 0.4 mg under the tongue every 5 (five) minutes as needed for chest pain. May use 3 times   pantoprazole 40 MG tablet Commonly known as:  PROTONIX Take 1 tablet (40 mg total) by mouth daily at 6 (six) AM.   polyethylene glycol packet Commonly known as:  MIRALAX / GLYCOLAX Take 17 g by mouth daily.   ranitidine 150 MG tablet Commonly known as:  ZANTAC Take 150 mg by mouth 2 (two) times daily as needed for heartburn.   senna 8.6 MG tablet Commonly known as:  SENOKOT Take 2 tablets by mouth 2 (two)  times daily as needed for constipation.   traMADol 50 MG tablet Commonly known as:  ULTRAM Take 1 tablet (50 mg total) by mouth every 6 (six) hours as needed for moderate pain or severe pain.   travoprost (benzalkonium) 0.004 % ophthalmic solution Commonly known as:  TRAVATAN Place 1 drop into both eyes at bedtime.   Vitamin D 1000 units capsule Take 1,000 Units by mouth daily with breakfast.       No orders of the defined types were placed in this encounter.   Immunization History  Administered Date(s) Administered  . Influenza Split 11/13/2011  . Influenza-Unspecified 02/13/2008, 11/30/2013  . PPD Test 12/15/2015  . Pneumococcal-Unspecified 02/13/2008  . Td 02/12/2009    Social History  Substance Use Topics  . Smoking status: Former Smoker    Packs/day: 0.50    Types: Cigarettes  . Smokeless tobacco: Never Used  . Alcohol use No    Family history is   Family History  Problem Relation Age of Onset  .  Stroke Mother   . Diabetes Mother   . Heart disease Mother   . Hearing loss Mother   . Cancer Father 51    esophageal      Review of Systems  UTO 2/2 confusion      Vitals:   01/02/16 0951  BP: 131/76  Pulse: 71  Resp: 18  Temp: 97 F (36.1 C)    SpO2 Readings from Last 1 Encounters:  12/31/15 96%   Body mass index is 20.13 kg/m.  PHYSICAL EXAM  GENERAL APPEARANCE: Alert, min conversant,  No acute distress.  SKIN:mild heat as expected over R hip incision HEAD: Normocephalic, atraumatic  EYES: Conjunctiva/lids clear. Pupils round, reactive. EOMs intact.  EARS: External exam WNL, canals clear. Hearing grossly normal.  NOSE: No deformity or discharge.  MOUTH/THROAT: Lips w/o lesions  RESPIRATORY: Breathing is even, unlabored. Lung sounds are clear   CARDIOVASCULAR: Heart RRR no murmurs, rubs or gallops. No peripheral edema.   GASTROINTESTINAL: Abdomen is soft, non-tender, not distended w/ normal bowel sounds. GENITOURINARY: Bladder non tender,  not distended  MUSCULOSKELETAL: No abnormal joints or musculature NEUROLOGIC:  Cranial nerves 2-12 grossly intact. Moves all extremities  PSYCHIATRIC: some confusion, no behavioral issues      Patient Active Problem List   Diagnosis Date Noted  . Closed right hip fracture, initial encounter ([place]) 12/19/2015  . Mood disorder ([place]) 12/17/2015  . Decubitus ulcer of hip, stage 3 ([place]) 12/11/2015  . Ankle pain   . Encephalopathy   . Malnutrition of moderate degree 01/29/2015  . Altered mental status 01/26/2015  . Nausea and vomiting 01/17/2015  . Esophageal dysmotilities   . Urinary tract infectious disease   . Weakness 01/14/2015  . Schatzki's ring 01/14/2015  . CKD (chronic kidney disease), stage III 01/14/2015  . Esophagus disorder   . Dysphagia 01/11/2015  . UTI (lower urinary tract infection) 03/30/2014  . Hearing loss, sensorineural 03/04/2014  . Nasal lesion 03/04/2014  . Unspecified constipation 12/12/2012  . Orthostatic hypotension 12/11/2012  . Arterial tortuosity (aorta) 12/11/2012  . Protein-calorie malnutrition, severe ([place]) 12/10/2012  . Decreased rectal sphincter tone 11/21/2012  . Idiopathic scoliosis 07/08/2012  . Macular degeneration 06/26/2011  . Vision disturbance 06/26/2011  . Glaucoma 06/26/2011  . Anemia of chronic disease 04/17/2011  . Drug-seeking behavior 02/26/2011  . Osteoporosis 02/26/2011  . Depression   . Anxiety   . COPD (chronic obstructive pulmonary disease) ([place])   . GERD (gastroesophageal reflux disease)   . Migraines   . Insomnia, unspecified       Labs reviewed: Basic Metabolic Panel:    Component Value Date/Time   NA 139 12/29/2015 0515   NA 139 12/29/2015   K 3.8 12/29/2015 0515   CL 107 12/29/2015 0515   CO2 21 (L) 12/29/2015 0515   GLUCOSE 114 (H) 12/29/2015 0515   BUN 10 12/29/2015 0515   BUN 10 12/29/2015   CREATININE 0.55 12/29/2015 0515   CREATININE 0.85 11/09/2013 1135   CALCIUM 8.5 (L) 12/29/2015 0515   PROT  6.4 (L) 12/19/2015 1717   ALBUMIN 2.9 (L) 12/19/2015 1717   AST 61 (H) 12/19/2015 1717   ALT 46 12/19/2015 1717   ALKPHOS 52 12/19/2015 1717   BILITOT 0.6 12/19/2015 1717   GFRNONAA >60 12/29/2015 0515   GFRNONAA 64 11/09/2013 1135   GFRAA >60 12/29/2015 0515   GFRAA 74 11/09/2013 1135     Recent Labs  01/16/15 2059  01/30/15 1006 11/23/15 1631  12/15/15 0344  12/27/15 0346 12/28/15  12/28/15 0431 12/29/15 12/29/15 0515  NA 143  < > 142 144  < > 142  < > 142 139 139 139 139  K 3.0*  < > 3.9 4.6  < > 3.9  < > 3.5 3.2* 3.2*  --  3.8  CL 108  < > 105 110  < > 111  < > 110  --  106  --  107  CO2 27  < > 27 27  < > 23  < > 23  --  23  --  21*  GLUCOSE 134*  < > 108* 126*  < > 95  < > 106*  --  110*  --  114*  BUN 17  < > 19 28*  < > 15  < > 6 12 12 10 10   CREATININE 0.66  < > 0.64 0.93  < > 0.74  < > 0.50 0.6 0.60 0.6 0.55  CALCIUM 8.5*  < > 9.0 9.0  < > 8.9  < > 8.4*  --  8.3*  --  8.5*  MG 1.8  < > 1.9 2.1  --  1.5*  --   --   --   --   --   --   PHOS 3.3  --   --   --   --   --   --   --   --   --   --   --   < > = values in this interval not displayed. Liver Function Tests:  Recent Labs  12/12/15 0442 12/13/15 0439 12/19/15 1717  AST 20 21 61*  ALT 9* 10* 46  ALKPHOS 58 61 52  BILITOT 0.6 0.9 0.6  PROT 5.3* 5.5* 6.4*  ALBUMIN 2.5* 2.7* 2.9*    Recent Labs  01/11/15 1430 01/16/15 2059 01/26/15 1547  LIPASE 23 23 21    No results for input(s): AMMONIA in the last 8760 hours. CBC:  Recent Labs  12/13/15 12/13/15 0439  12/19/15 1717  12/25/15 0358 12/26/15 12/26/15 0416 12/27/15 12/27/15 0346  WBC 9.0 9.0  < > 7.8  < > 8.2 5.7 5.7 5.7 5.7  NEUTROABS 7 6.9  --  5.4  --   --   --   --   --   --   HGB 10.8* 10.8*  < > 10.9*  < > 8.4* 8.3* 8.3*  --  8.4*  HCT 34* 33.6*  < > 35.2*  < > 27.3* 26* 25.9*  --  27.1*  MCV  --  93.3  < > 93.9  < > 91.0  --  89.0  --  88.6  PLT 215 215  < > 285  < > 331 357 357  --  406*  < > = values in this interval not  displayed. Lipid No results for input(s): CHOL, HDL, LDLCALC, TRIG in the last 8760 hours.  Cardiac Enzymes:  Recent Labs  01/26/15 1557  CKTOTAL 62   BNP: No results for input(s): BNP in the last 8760 hours. No results found for: MICROALBUR No results found for: HGBA1C Lab Results  Component Value Date   TSH 0.509 06/26/2011   No results found for: VITAMINB12 No results found for: FOLATE No results found for: IRON, TIBC, FERRITIN  Imaging and Procedures obtained prior to SNF admission: Dg Chest 1 View  Result Date: 12/19/2015 CLINICAL DATA:  Hip fracture, fall EXAM: CHEST 1 VIEW COMPARISON:  12/15/2015 FINDINGS: Semi-erect view of the chest demonstrates mild hazy bibasilar opacity,  probable atelectasis. No large effusion. Cardiomediastinal silhouette stable with atherosclerosis. No pneumothorax. Skin fold artifact over the left chest. IMPRESSION: Hazy bibasilar atelectasis. Atherosclerosis of the aorta Electronically Signed   By: Donavan Foil M.D.   On: 12/19/2015 18:11   Dg Hip Unilat With Pelvis 2-3 Views Right  Result Date: 12/19/2015 CLINICAL DATA:  Fall at nursing facility EXAM: DG HIP (WITH OR WITHOUT PELVIS) 2-3V RIGHT COMPARISON:  01/27/2015 FINDINGS: The left hip demonstrates normal positioning. The pubic symphysis is intact. There is a mildly comminuted right femoral neck fracture with mild superior migration of the distal femur. The right femoral head remains seated within the acetabulum. There is possible underlying lucency within the superolateral femoral neck. There is mild varus angulation. IMPRESSION: 1. Mildly comminuted, displaced and angulated right femoral neck fracture. Possible lucency within the underlying superolateral femoral neck, pathologic fracture cannot be excluded. 2. The left hip is within normal limits Electronically Signed   By: Donavan Foil M.D.   On: 12/19/2015 18:09     Not all labs, radiology exams or other studies done during hospitalization  come through on my EPIC note; however they are reviewed by me.    Assessment and Plan  R HIP FRACTURE - S/P ORIF, prophylaxed with lovenox SNF - admitted for OT/PT; cont lovenox as prophylaxis  ACUTE ENCEPHALOPATHY- patient's mental status waxed and waned but generally improved slightly. EEG was significant for no seizure activity with evidence of global encephalopathy. Discussed with Dr. Shon Hale, Neurology, who agreed that this is likely secondary to prolonged delirium from recent infections and anesthesia. Recommendations are to monitor for improvement and follow-up with neurology outpatient SNF - supportive care  ASPIRATION PNA, RLL/ DYSPHAGIA  -Recurrent. Patient started on Levaquin. Speech therapy evaluated and patient high risk for aspiration. No safe oral regimen could be recommended. Discussed this with patient and daughter and they declined initiation of NG or G-tube after risks and benefits were explained. No oxygen requirement. Completed antibiotics. SNF - will need to have a dysphagia waver signed  ORAL THRUSH SNF - cont diflucan for 14 days  HYPOKALEMIA- repleted SNF - will f/u BMP  PROTEIN CALORIE MALNUTRITION - Speech therapy evaluated and patient high risk for aspiration. No safe oral regimen could be recommended. Discussed this with patient and daughter and they declined initiation of NG or G-tube after risks and benefits were explained. SNF - pt will need to sign dysphagia waiver  CKD3 - GFR calc 53, Cr normal SNF - stable; will f/u BMP  DECUBITUS ULCER OF HIP, STAGE 3 SNF - wound care will follow  HTN SNF - stable, cont metoprolol 25 mg BID  GERD SNF - controlled ; cont protonix 40 mg daily  BIPOLAR DX SNF = cont depakote ER 500 mg daily     Time spent > 45 min;> 50% of time with patient was spent reviewing records, labs, tests and studies, counseling and developing plan of care  Ann D. [name], MD

## 2016-01-08 ENCOUNTER — Encounter: Payer: Self-pay | Admitting: Internal Medicine

## 2016-01-08 DIAGNOSIS — I10 Essential (primary) hypertension: Secondary | ICD-10-CM | POA: Insufficient documentation

## 2016-01-08 DIAGNOSIS — Z8781 Personal history of (healed) traumatic fracture: Secondary | ICD-10-CM

## 2016-01-08 DIAGNOSIS — Z9889 Other specified postprocedural states: Secondary | ICD-10-CM

## 2016-01-08 DIAGNOSIS — B370 Candidal stomatitis: Secondary | ICD-10-CM | POA: Insufficient documentation

## 2016-01-08 HISTORY — DX: Other specified postprocedural states: Z98.890

## 2016-01-08 HISTORY — DX: Personal history of (healed) traumatic fracture: Z87.81

## 2016-01-25 ENCOUNTER — Encounter: Payer: Self-pay | Admitting: Internal Medicine

## 2016-01-25 ENCOUNTER — Non-Acute Institutional Stay (SKILLED_NURSING_FACILITY): Payer: Medicare Other | Admitting: Internal Medicine

## 2016-01-25 DIAGNOSIS — R627 Adult failure to thrive: Secondary | ICD-10-CM | POA: Diagnosis not present

## 2016-01-25 DIAGNOSIS — L03114 Cellulitis of left upper limb: Secondary | ICD-10-CM

## 2016-01-25 NOTE — Progress Notes (Signed)
Location:  [place] Room Number: 161W Place of Service:  SNF (31)  [name], MD  Patient Care Team: Pcp Not In System as PCP - General [name], MD (Gastroenterology) [name], NP (Obstetrics and Gynecology)  Extended Emergency Contact Information Primary Emergency Contact: [name],[name] Address: [name], [place] Phone: [phone] Mobile Phone: [phone] Relation: Daughter Secondary Emergency Contact: [name] Phone: [phone] Mobile Phone: [phone] Relation: Daughter    Allergies: Macrobid [nitrofurantoin macrocrystal]; Azithromycin; Doxycycline; Escitalopram oxalate; Fioricet [butalbital-apap-caffeine]; Latex; Levofloxacin; Nsaids; Oxycodone; Restasis [cyclosporine]; Sulfa antibiotics; and Biaxin [clarithromycin]  Chief Complaint  Patient presents with  . Acute Visit    Acute    HPI: Patient is 82 y.o. female who I am seeing in f/u.. pT HAS HAS CONFUSION SINCE AT [facility], with a slow and steady decline. Nurses warned me that pt may swing at me.  Past Medical History:  Diagnosis Date  . Anxiety   . Arterial tortuosity (aorta) 12/11/2012  . Asthma   . Chronic kidney disease, stage IV (severe) ([place]) 12/11/2012  . Chronic mental illness   . Colitis   . COPD (chronic obstructive pulmonary disease) ([place])   . Depression   . Dysphagia 01/11/2015  . GERD (gastroesophageal reflux disease)   . Insomnia, unspecified   . Migraines   . Nasal bone fracture 03/30/2014  . Osteoporosis   . Schatzki's ring 01/14/2015  . Scoliosis   . Substance abuse    Benbzos and Narcotics, she does not get any of those medicines from [facility], we have empathetically told her that.  . Vertigo   . Vitamin B deficiency   . Weight loss     Past Surgical History:  Procedure Laterality Date  . ABDOMINAL HYSTERECTOMY    . CATARACT  EXTRACTION    . CHOLECYSTECTOMY    . ESOPHAGOGASTRODUODENOSCOPY (EGD) WITH PROPOFOL N/A 01/13/2015   Procedure: ESOPHAGOGASTRODUODENOSCOPY (EGD) WITH PROPOFOL;  Surgeon: [name], MD;  [facility];  Service: Endoscopy;  Laterality: N/A;  . HIP ARTHROPLASTY Right 12/21/2015   Procedure: ARTHROPLASTY BIPOLAR HIP (HEMIARTHROPLASTY);  Surgeon: [name], MD;  Location: [place];  Service: Orthopedics;  Laterality: Right;  . PARTIAL HYSTERECTOMY        Medication List       Accurate as of 01/25/16 11:59 PM. Always use your most recent med list.          acetaminophen 325 MG tablet Commonly known as:  TYLENOL Take 650 mg by mouth every 6 (six) hours as needed for headache.   albuterol 108 (90 Base) MCG/ACT inhaler Commonly known as:  PROVENTIL HFA;VENTOLIN HFA Inhale 1 puff into the lungs every 6 (six) hours as needed for shortness of breath.   ASPERCREME 10 % Lotn Generic drug:  Trolamine Salicylate Apply 1 application topically 2 (two) times daily as needed (for painful areas).   collagenase ointment Commonly known as:  SANTYL Apply topically daily.   Cranberry 425 MG Caps Take 425 mg by mouth 2 (two) times daily.   divalproex 500 MG 24 hr tablet Commonly known as:  DEPAKOTE ER Take 500 mg by mouth daily with breakfast.   docusate sodium 100 MG capsule Commonly known as:  COLACE Take 100 mg by mouth 2 (two) times daily.   dorzolamide-timolol 22.3-6.8 MG/ML ophthalmic solution Commonly known as:  COSOPT Place 1  drop into both eyes 2 (two) times daily.   enoxaparin 40 MG/0.4ML injection Commonly known as:  LOVENOX Inject 0.3 mLs (30 mg total) into the skin daily. Last dose 01/11/2016   feeding supplement (ENSURE ENLIVE) Liqd Take 237 mLs by mouth 2 (two) times daily between meals.   fluconazole 100 MG tablet Commonly known as:  DIFLUCAN Take 1 tablet (100 mg total) by mouth daily. Last dose 01/07/2016   fludrocortisone 0.1 MG  tablet Commonly known as:  FLORINEF Take 0.1 mg by mouth every morning.   lactulose 10 GM/15ML solution Commonly known as:  CHRONULAC Take 15 mLs (10 g total) by mouth daily as needed for severe constipation.   lidocaine 5 % Commonly known as:  LIDODERM Place 1 patch onto the skin every 12 (twelve) hours as needed (for pain). Remove & Discard patch within 12 hours or as directed by MD   metoprolol tartrate 25 MG tablet Commonly known as:  LOPRESSOR Take 25 mg by mouth 2 (two) times daily.   nitroGLYCERIN 0.4 MG SL tablet Commonly known as:  NITROSTAT Place 0.4 mg under the tongue every 5 (five) minutes as needed for chest pain. May use 3 times   pantoprazole 40 MG tablet Commonly known as:  PROTONIX Take 1 tablet (40 mg total) by mouth daily at 6 (six) AM.   polyethylene glycol packet Commonly known as:  MIRALAX / GLYCOLAX Take 17 g by mouth daily.   ranitidine 150 MG tablet Commonly known as:  ZANTAC Take 150 mg by mouth 2 (two) times daily as needed for heartburn.   saccharomyces boulardii 250 MG capsule Commonly known as:  FLORASTOR Take 250 mg by mouth 2 (two) times daily.   senna 8.6 MG tablet Commonly known as:  SENOKOT Take 2 tablets by mouth 2 (two) times daily as needed for constipation.   traMADol 50 MG tablet Commonly known as:  ULTRAM Take 1 tablet (50 mg total) by mouth every 6 (six) hours as needed for moderate pain or severe pain.   travoprost (benzalkonium) 0.004 % ophthalmic solution Commonly known as:  TRAVATAN Place 1 drop into both eyes at bedtime.   Vitamin D 1000 units capsule Take 1,000 Units by mouth daily with breakfast.       Meds ordered this encounter  Medications  . saccharomyces boulardii (FLORASTOR) 250 MG capsule    Sig: Take 250 mg by mouth 2 (two) times daily.    Immunization History  Administered Date(s) Administered  . Influenza Split 11/13/2011  . Influenza-Unspecified 02/13/2008, 11/30/2013  . PPD Test 12/15/2015   . Pneumococcal-Unspecified 02/13/2008  . Td 02/12/2009    Social History  Substance Use Topics  . Smoking status: Former Smoker    Packs/day: 0.50    Types: Cigarettes  . Smokeless tobacco: Never Used  . Alcohol use No    Review of Systems UTO 2/2 confusion; nursing as per HPI    Vitals:   01/25/16 1312  BP: (!) 178/88  Pulse: 78  Resp: 18  Temp: 97.2 F (36.2 C)   Body mass index is 19.47 kg/m. Physical Exam  GENERAL APPEARANCE: Alert, minconversant, No acute distress  SKIN: L hand - mild swelling dorsum with redness and heat HEENT: Unremarkable RESPIRATORY: Breathing is even, unlabored. Lung sounds are clear   CARDIOVASCULAR: Heart RRR no murmurs, rubs or gallops. No peripheral edema  GASTROINTESTINAL: Abdomen is soft, non-tender, not distended w/ normal bowel sounds.  GENITOURINARY: Bladder non tender, not distended  MUSCULOSKELETAL: No abnormal joints  or musculature NEUROLOGIC: Cranial nerves 2-12 grossly intact. Moves all extremities PSYCHIATRIC: dementia, no behavioral issues  Patient Active Problem List   Diagnosis Date Noted  . S/P ORIF (open reduction internal fixation) fracture 01/08/2016  . Oral thrush 01/08/2016  . HTN (hypertension) 01/08/2016  . Closed hip fracture requiring operative repair, right, with routine healing, subsequent encounter 12/19/2015  . Bipolar disease, chronic ([place]) 12/17/2015  . Decubitus ulcer of hip, stage 3 ([place]) 12/11/2015  . Ankle pain   . Encephalopathy   . Malnutrition of moderate degree 01/29/2015  . Altered mental status 01/26/2015  . Nausea and vomiting 01/17/2015  . Esophageal dysmotilities   . Urinary tract infectious disease   . Weakness 01/14/2015  . Schatzki's ring 01/14/2015  . CKD (chronic kidney disease), stage III 01/14/2015  . Esophagus disorder   . Dysphagia 01/11/2015  . UTI (lower urinary tract infection) 03/30/2014  . Hearing loss, sensorineural 03/04/2014  . Nasal lesion 03/04/2014  .  Unspecified constipation 12/12/2012  . Hypokalemia 12/11/2012  . Orthostatic hypotension 12/11/2012  . Arterial tortuosity (aorta) 12/11/2012  . Protein-calorie malnutrition, severe ([place]) 12/10/2012  . Decreased rectal sphincter tone 11/21/2012  . Idiopathic scoliosis 07/08/2012  . Macular degeneration 06/26/2011  . Vision disturbance 06/26/2011  . Glaucoma 06/26/2011  . Anemia of chronic disease 04/17/2011  . Drug-seeking behavior 02/26/2011  . Osteoporosis 02/26/2011  . Depression   . Anxiety   . COPD (chronic obstructive pulmonary disease) ([place])   . GERD (gastroesophageal reflux disease)   . Migraines   . Insomnia, unspecified     CMP     Component Value Date/Time   NA 139 12/29/2015 0515   NA 139 12/29/2015   K 3.8 12/29/2015 0515   CL 107 12/29/2015 0515   CO2 21 (L) 12/29/2015 0515   GLUCOSE 114 (H) 12/29/2015 0515   BUN 10 12/29/2015 0515   BUN 10 12/29/2015   CREATININE 0.55 12/29/2015 0515   CREATININE 0.85 11/09/2013 1135   CALCIUM 8.5 (L) 12/29/2015 0515   PROT 6.4 (L) 12/19/2015 1717   ALBUMIN 2.9 (L) 12/19/2015 1717   AST 61 (H) 12/19/2015 1717   ALT 46 12/19/2015 1717   ALKPHOS 52 12/19/2015 1717   BILITOT 0.6 12/19/2015 1717   GFRNONAA >60 12/29/2015 0515   GFRNONAA 64 11/09/2013 1135   GFRAA >60 12/29/2015 0515   GFRAA 74 11/09/2013 1135    Recent Labs  01/30/15 1006 11/23/15 1631  12/15/15 0344  12/27/15 0346 12/28/15 12/28/15 0431 12/29/15 12/29/15 0515  NA 142 144  < > 142  < > 142 139 139 139 139  K 3.9 4.6  < > 3.9  < > 3.5 3.2* 3.2*  --  3.8  CL 105 110  < > 111  < > 110  --  106  --  107  CO2 27 27  < > 23  < > 23  --  23  --  21*  GLUCOSE 108* 126*  < > 95  < > 106*  --  110*  --  114*  BUN 19 28*  < > 15  < > 6 12 12 10 10   CREATININE 0.64 0.93  < > 0.74  < > 0.50 0.6 0.60 0.6 0.55  CALCIUM 9.0 9.0  < > 8.9  < > 8.4*  --  8.3*  --  8.5*  MG 1.9 2.1  --  1.5*  --   --   --   --   --   --   < > =  values in this interval not  displayed.  Recent Labs  12/12/15 0442 12/13/15 0439 12/19/15 1717  AST 20 21 61*  ALT 9* 10* 46  ALKPHOS 58 61 52  BILITOT 0.6 0.9 0.6  PROT 5.3* 5.5* 6.4*  ALBUMIN 2.5* 2.7* 2.9*    Recent Labs  12/13/15 12/13/15 0439  12/19/15 1717  12/25/15 0358 12/26/15 12/26/15 0416 12/27/15 12/27/15 0346  WBC 9.0 9.0  < > 7.8  < > 8.2 5.7 5.7 5.7 5.7  NEUTROABS 7 6.9  --  5.4  --   --   --   --   --   --   HGB 10.8* 10.8*  < > 10.9*  < > 8.4* 8.3* 8.3*  --  8.4*  HCT 34* 33.6*  < > 35.2*  < > 27.3* 26* 25.9*  --  27.1*  MCV  --  93.3  < > 93.9  < > 91.0  --  89.0  --  88.6  PLT 215 215  < > 285  < > 331 357 357  --  406*  < > = values in this interval not displayed. No results for input(s): CHOL, LDLCALC, TRIG in the last 8760 hours.  Invalid input(s): HCL No results found for: MICROALBUR Lab Results  Component Value Date   TSH 0.509 06/26/2011   No results found for: HGBA1C No results found for: CHOL, HDL, LDLCALC, LDLDIRECT, TRIG, CHOLHDL  Significant Diagnostic Results in last 30 days:  Dg Swallowing Func-speech Pathology  Result Date: 12/27/2015 Objective Swallowing Evaluation: Type of Study: MBS-Modified Barium Swallow Study Patient Details Name: KANDEE ESCALANTE MRN: [id number] Date of Birth: 1928-03-19 Today's Date: 12/27/2015 Time: SLP Start Time (ACUTE ONLY): 1330-SLP Stop Time (ACUTE ONLY): 1400 SLP Time Calculation (min) (ACUTE ONLY): 30 min Past Medical History: Past Medical History: Diagnosis Date . Abuse   benzos/narcotics . Anxiety  . Arterial tortuosity (aorta) 12/11/2012 . Asthma  . Chronic kidney disease, stage IV (severe) ([place]) 12/11/2012 . Chronic mental illness  . Colitis  . COPD (chronic obstructive pulmonary disease) ([place])  . Depression  . Dysphagia 01/11/2015 . GERD (gastroesophageal reflux disease)  . Insomnia, unspecified  . Migraines  . Nasal bone fracture 03/30/2014 . Osteoporosis  . Schatzki's ring 01/14/2015 . Scoliosis  . Substance abuse   Benzos and  Narcotics, she does not get any of those medicines from [facility], we have empahtically told her that.  . Vertigo  . Vitamin B deficiency  . Weight loss  Past Surgical History: Past Surgical History: Procedure Laterality Date . ABDOMINAL HYSTERECTOMY   . CATARACT EXTRACTION   . CHOLECYSTECTOMY   . ESOPHAGOGASTRODUODENOSCOPY (EGD) WITH PROPOFOL N/A 01/13/2015  Procedure: ESOPHAGOGASTRODUODENOSCOPY (EGD) WITH PROPOFOL;  Surgeon: [name], MD;  [facility];  Service: Endoscopy;  Laterality: N/A; . HIP ARTHROPLASTY Right 12/21/2015  Procedure: ARTHROPLASTY BIPOLAR HIP (HEMIARTHROPLASTY);  Surgeon: [name], MD;  Location: [place];  Service: Orthopedics;  Laterality: Right; . PARTIAL HYSTERECTOMY   HPI: JAICE LAGUE is a 82 y.o. female with medical history significant of benzo/narcotics abuse, anxiety, depression, CKD, COPD, GERD, migraines, osteoporosis, scoliosis, substance abuse, vertigo admitted after fall.  Found to have a hip fx and plans are for surgery.  Pt has been admitted recently with pna per her daughter and has baseline esophageal dysphagia with prior food impaction per chart review.  Esophagram 01/18/2015 done showed poor primary esophageal stripping wave and tertiary contractions and a prominent kink of mid and distal thoracic esophagus  resulting in lodged barium tablet that did not clear despite warm water ingestion over 10 minutes.  Per family within the last weeks, pt has needed someone to feed her at her facility.  Subjective: Pt lethargic, but alerted with verbal/tactile stim. Assessment / Plan / Recommendation CHL IP CLINICAL IMPRESSIONS 12/27/2015 Therapy Diagnosis Severe pharyngeal phase dysphagia;Suspected primary esophageal dysphagia;Moderate oral phase dysphagia Clinical Impression Pt presents with severe oropharyngeal dysphagia further complicated by esophageal dysphagia. Silent aspiration of thin and puree noted. No other consistencies were trialed secondary to worsening  pharyngeal residue with items with increased viscosity. Oral holding and pharyngeal pooling without pt awareness for multiple minutes prior to swallow response despite frequent verbal and tactile cueing to initiate swallow. No safe PO established. Discussed with physician. Will sign off at this time. Please reconsult if pt's status improves and reassessment is appropriate.  Impact on safety and function Severe aspiration risk   CHL IP TREATMENT RECOMMENDATION 12/27/2015 Treatment Recommendations Patient unable to participate in swallow therapy at this time   Prognosis 12/27/2015 Prognosis for Safe Diet Advancement Guarded Barriers to Reach Goals -- Barriers/Prognosis Comment -- CHL IP DIET RECOMMENDATION 12/27/2015 SLP Diet Recommendations (No Data) Liquid Administration via -- Medication Administration Crushed with puree Compensations Minimize environmental distractions;Slow rate;Small sips/bites;Follow solids with liquid Postural Changes Remain semi-upright after after feeds/meals (Comment);Seated upright at 90 degrees   CHL IP OTHER RECOMMENDATIONS 12/27/2015 Recommended Consults -- Oral Care Recommendations Oral care BID Other Recommendations --   CHL IP FOLLOW UP RECOMMENDATIONS 12/27/2015 Follow up Recommendations Skilled Nursing facility   [facility] IP FREQUENCY AND DURATION 12/20/2015 Speech Therapy Frequency (ACUTE ONLY) min 1 x/week Treatment Duration 1 week      CHL IP ORAL PHASE 12/27/2015 Oral Phase Impaired Oral - Pudding Teaspoon -- Oral - Pudding Cup -- Oral - Honey Teaspoon -- Oral - Honey Cup -- Oral - Nectar Teaspoon -- Oral - Nectar Cup -- Oral - Nectar Straw -- Oral - Thin Teaspoon -- Oral - Thin Cup -- Oral - Thin Straw -- Oral - Puree -- Oral - Mech Soft -- Oral - Regular -- Oral - Multi-Consistency -- Oral - Pill -- Oral Phase - Comment no attempts to masticate solids  CHL IP PHARYNGEAL PHASE 12/27/2015 Pharyngeal Phase Impaired Pharyngeal- Pudding Teaspoon -- Pharyngeal -- Pharyngeal- Pudding Cup  -- Pharyngeal -- Pharyngeal- Honey Teaspoon -- Pharyngeal -- Pharyngeal- Honey Cup -- Pharyngeal -- Pharyngeal- Nectar Teaspoon -- Pharyngeal -- Pharyngeal- Nectar Cup -- Pharyngeal -- Pharyngeal- Nectar Straw -- Pharyngeal -- Pharyngeal- Thin Teaspoon Delayed swallow initiation-pyriform sinuses;Reduced epiglottic inversion;Reduced airway/laryngeal closure;Reduced pharyngeal peristalsis Pharyngeal -- Pharyngeal- Thin Cup Delayed swallow initiation-pyriform sinuses;Reduced airway/laryngeal closure;Reduced pharyngeal peristalsis;Reduced epiglottic inversion;Pharyngeal residue - valleculae;Pharyngeal residue - pyriform;Lateral channel residue Pharyngeal -- Pharyngeal- Thin Straw Delayed swallow initiation-pyriform sinuses;Reduced pharyngeal peristalsis;Reduced epiglottic inversion;Reduced airway/laryngeal closure;Penetration/Apiration after swallow;Moderate aspiration;Lateral channel residue;Pharyngeal residue - valleculae;Pharyngeal residue - pyriform;Pharyngeal residue - posterior pharnyx Pharyngeal Material enters airway, passes BELOW cords without attempt by patient to eject out (silent aspiration) Pharyngeal- Puree Delayed swallow initiation-pyriform sinuses;Pharyngeal residue - pyriform;Pharyngeal residue - posterior pharnyx;Moderate aspiration;Penetration/Apiration after swallow;Reduced airway/laryngeal closure;Lateral channel residue Pharyngeal Material enters airway, passes BELOW cords without attempt by patient to eject out (silent aspiration) Pharyngeal- Mechanical Soft -- Pharyngeal -- Pharyngeal- Regular -- Pharyngeal -- Pharyngeal- Multi-consistency -- Pharyngeal -- Pharyngeal- Pill Pharyngeal residue - pyriform Pharyngeal -- Pharyngeal Comment --  CHL IP CERVICAL ESOPHAGEAL PHASE 12/27/2015 Cervical Esophageal Phase Impaired Pudding Teaspoon -- Pudding Cup -- Honey Teaspoon -- Honey Cup -- [employer]  Teaspoon -- Nectar Cup -- Nectar Straw -- Thin Teaspoon -- Thin Cup -- Thin Straw -- Puree Esophageal  backflow into cervical esophagus Mechanical Soft -- Regular -- Multi-consistency -- Pill (No Data) Cervical Esophageal Comment -- CHL IP GO 12/10/2012 Functional Assessment Tool Used clinical judgement Functional Limitations Swallowing Swallow Current Status (F7902) CI Swallow Goal Status (I0973) [place] Swallow Discharge Status (Z3299) (None) Motor Speech Current Status (M4268) (None) Motor Speech Goal Status (T4196) (None) Motor Speech Goal Status (Q2297) (None) Spoken Language Comprehension Current Status (L8921) (None) Spoken Language Comprehension Goal Status (J9417) (None) Spoken Language Comprehension Discharge Status [phone]) (None) Spoken Language Expression Current Status [phone]) (None) Spoken Language Expression Goal Status [phone]) (None) Spoken Language Expression Discharge Status [phone]) (None) Attention Current Status (Z8588) (None) Attention Goal Status (F0277) (None) Attention Discharge Status [phone]) (None) Memory Current Status (O6767) (None) Memory Goal Status (M0947) (None) Memory Discharge Status (S9628) (None) Voice Current Status (Z6629) (None) Voice Goal Status (U7654) (None) Voice Discharge Status [phone]) (None) Other Speech-Language Pathology Functional Limitation [phone]) (None) Other Speech-Language Pathology Functional Limitation Goal Status (L2751) (None) Other Speech-Language Pathology Functional Limitation Discharge Status [phone]) (None) Vinetta Bergamo MA, [place] Pager [phone] 12/27/2015, 4:13 PM               Assessment and Plan  L HAND CELLULITIS - WAS STARTED ON KEFLEX 500 MG Q 8 FOR 7 DAYS ON 12/10; improving  FTT - pt is very inconsistent with eating and drinking, is having a slow decline; will cont to encourage eating and drinking and monitor   Time spent > 25 min;> 50% of time with patient was spent reviewing records, labs, tests and studies, counseling and developing plan of care  Webb Silversmith D. [name], MD

## 2016-02-10 ENCOUNTER — Non-Acute Institutional Stay (SKILLED_NURSING_FACILITY): Payer: Medicare Other | Admitting: Internal Medicine

## 2016-02-10 ENCOUNTER — Encounter: Payer: Self-pay | Admitting: Internal Medicine

## 2016-02-10 DIAGNOSIS — K219 Gastro-esophageal reflux disease without esophagitis: Secondary | ICD-10-CM | POA: Diagnosis not present

## 2016-02-10 DIAGNOSIS — J449 Chronic obstructive pulmonary disease, unspecified: Secondary | ICD-10-CM | POA: Diagnosis not present

## 2016-02-10 DIAGNOSIS — H409 Unspecified glaucoma: Secondary | ICD-10-CM | POA: Diagnosis not present

## 2016-02-10 NOTE — Progress Notes (Signed)
Location:  [facility] Room Number: 109N Place of Service:  SNF [phone])  Taylor Delaine. [name], MD  Patient Care Team: Pcp Not In System as PCP - General [name], MD (Gastroenterology) [name], NP (Obstetrics and Gynecology)  Extended Emergency Contact Information Primary Emergency Contact: [name],[name] Address: [place], [place] Phone: [phone] Mobile Phone: [phone] Relation: Daughter Secondary Emergency Contact: [name] Phone: [phone] Mobile Phone: [phone] Relation: Daughter    Allergies: Macrobid [nitrofurantoin macrocrystal]; Azithromycin; Doxycycline; Escitalopram oxalate; Fioricet [butalbital-apap-caffeine]; Latex; Levofloxacin; Nsaids; Oxycodone; Restasis [cyclosporine]; Sulfa antibiotics; and Biaxin [clarithromycin]  Chief Complaint  Patient presents with  . Medical Management of Chronic Issues    Routine Visit    HPI: Patient is 82 y.o. female who is being seen for routine issues of glaucoma, GERD and COPD.  Past Medical History:  Diagnosis Date  . Anxiety   . Arterial tortuosity (aorta) 12/11/2012  . Asthma   . Chronic kidney disease, stage IV (severe) ([place]) 12/11/2012  . Chronic mental illness   . Colitis   . COPD (chronic obstructive pulmonary disease) ([place])   . Depression   . Dysphagia 01/11/2015  . GERD (gastroesophageal reflux disease)   . Insomnia, unspecified   . Migraines   . Nasal bone fracture 03/30/2014  . Osteoporosis   . Schatzki's ring 01/14/2015  . Scoliosis   . Substance abuse    Benbzos and Narcotics, she does not get any of those medicines from [facility], we have empathetically told her that.  . Vertigo   . Vitamin B deficiency   . Weight loss     Past Surgical History:  Procedure Laterality Date  . ABDOMINAL HYSTERECTOMY    . CATARACT EXTRACTION    . CHOLECYSTECTOMY    .  ESOPHAGOGASTRODUODENOSCOPY (EGD) WITH PROPOFOL N/A 01/13/2015   Procedure: ESOPHAGOGASTRODUODENOSCOPY (EGD) WITH PROPOFOL;  Surgeon: [name], MD;  Location: [facility];  Service: Endoscopy;  Laterality: N/A;  . HIP ARTHROPLASTY Right 12/21/2015   Procedure: ARTHROPLASTY BIPOLAR HIP (HEMIARTHROPLASTY);  Surgeon: [name], MD;  Location: [facility];  Service: Orthopedics;  Laterality: Right;  . PARTIAL HYSTERECTOMY      Allergies as of 02/10/2016      Reactions   Macrobid [nitrofurantoin Macrocrystal] Rash   Azithromycin Other (See Comments)   unknown   Doxycycline Other (See Comments)   unknown   Escitalopram Oxalate Other (See Comments)   unknown   Fioricet [butalbital-apap-caffeine] Other (See Comments)   Drunk.   Latex Other (See Comments)   unknown   Levofloxacin Other (See Comments)   unknown   Nsaids Other (See Comments)   This is not an allergy. The patient was told by Dr. Rockne Menghini to avoid NSAIDs and stop Vicoprofen in to 2014 to avoid nephrotoxicity.  Allergic reaction not listed on MAR.   Oxycodone Other (See Comments)   reaction to synthetic codeine   Restasis [cyclosporine] Other (See Comments)   unknown   Sulfa Antibiotics Other (See Comments)   Reaction unknown   Biaxin [clarithromycin] Rash   With burning sensation      Medication List       Accurate as of 02/10/16 11:59 PM. Always use your most recent med list.          acetaminophen 325 MG tablet Commonly known as:  TYLENOL Take 650 mg by mouth every 6 (  six) hours as needed for headache.   albuterol 108 (90 Base) MCG/ACT inhaler Commonly known as:  PROVENTIL HFA;VENTOLIN HFA Inhale 1 puff into the lungs every 6 (six) hours as needed for shortness of breath.   ASPERCREME 10 % Lotn Generic drug:  Trolamine Salicylate Apply 1 application topically 2 (two) times daily as needed (for painful areas).   collagenase ointment Commonly known as:  SANTYL Apply topically daily.   Cranberry 425  MG Caps Take 425 mg by mouth 2 (two) times daily.   divalproex 500 MG 24 hr tablet Commonly known as:  DEPAKOTE ER Take 500 mg by mouth daily with breakfast.   docusate sodium 100 MG capsule Commonly known as:  COLACE Take 100 mg by mouth 2 (two) times daily.   dorzolamide-timolol 22.3-6.8 MG/ML ophthalmic solution Commonly known as:  COSOPT Place 1 drop into both eyes 2 (two) times daily.   feeding supplement Liqd Take 1 Container by mouth 2 (two) times daily between meals.   NUTRITIONAL SUPPLEMENT Liqd Initiate 8 oz of Med Pass by mouth 2 times daily to support weight status   fludrocortisone 0.1 MG tablet Commonly known as:  FLORINEF Take 0.1 mg by mouth every morning.   lactulose 10 GM/15ML solution Commonly known as:  CHRONULAC Take 15 mLs (10 g total) by mouth daily as needed for severe constipation.   lidocaine 5 % Commonly known as:  LIDODERM Place 1 patch onto the skin every 12 (twelve) hours as needed (for pain). Remove & Discard patch within 12 hours or as directed by MD   metoprolol tartrate 25 MG tablet Commonly known as:  LOPRESSOR Take 25 mg by mouth 2 (two) times daily.   nitroGLYCERIN 0.4 MG SL tablet Commonly known as:  NITROSTAT Place 0.4 mg under the tongue every 5 (five) minutes as needed for chest pain. May use 3 times   pantoprazole 40 MG tablet Commonly known as:  PROTONIX Take 1 tablet (40 mg total) by mouth daily at 6 (six) AM.   polyethylene glycol packet Commonly known as:  MIRALAX / GLYCOLAX Take 17 g by mouth daily.   ranitidine 150 MG tablet Commonly known as:  ZANTAC Take 150 mg by mouth 2 (two) times daily as needed for heartburn.   senna 8.6 MG tablet Commonly known as:  SENOKOT Take 2 tablets by mouth 2 (two) times daily as needed for constipation.   traMADol 50 MG tablet Commonly known as:  ULTRAM Take 1 tablet (50 mg total) by mouth every 6 (six) hours as needed for moderate pain or severe pain.   travoprost  (benzalkonium) 0.004 % ophthalmic solution Commonly known as:  TRAVATAN Place 1 drop into both eyes at bedtime.   Vitamin D 1000 units capsule Take 1,000 Units by mouth daily with breakfast.       Meds ordered this encounter  Medications  . feeding supplement (BOOST / RESOURCE BREEZE) LIQD    Sig: Take 1 Container by mouth 2 (two) times daily between meals.  . NUTRITIONAL SUPPLEMENT LIQD    Sig: Initiate 8 oz of Med Pass by mouth 2 times daily to support weight status    Immunization History  Administered Date(s) Administered  . Influenza Split 11/13/2011  . Influenza-Unspecified 02/13/2008, 11/30/2013  . PPD Test 12/15/2015  . Pneumococcal-Unspecified 02/13/2008  . Td 02/12/2009    Social History  Substance Use Topics  . Smoking status: Former Smoker    Packs/day: 0.50    Types: Cigarettes  . Smokeless tobacco:  Never Used  . Alcohol use No    Review of Systems  UTO 2/2 dementia   Vitals:   02/10/16 1038  BP: (!) 178/88  Pulse: 68  Resp: 20  Temp: 97 F (36.1 C)   Body mass index is 19.19 kg/m. Physical Exam  GENERAL APPEARANCE: Alert, min conversant, No acute distress  SKIN: No diaphoresis rash HEENT: Unremarkable RESPIRATORY: Breathing is even, unlabored. Lung sounds are clear   CARDIOVASCULAR: Heart RRR no murmurs, rubs or gallops. No peripheral edema  GASTROINTESTINAL: Abdomen is soft, non-tender, not distended w/ normal bowel sounds.  GENITOURINARY: Bladder non tender, not distended  MUSCULOSKELETAL: No abnormal joints or musculature NEUROLOGIC: Cranial nerves 2-12 grossly intact. Moves all extremities PSYCHIATRIC: Mood and affect appropriate od, no behavioral issues  Patient Active Problem List   Diagnosis Date Noted  . S/P ORIF (open reduction internal fixation) fracture 01/08/2016  . Oral thrush 01/08/2016  . HTN (hypertension) 01/08/2016  . Closed hip fracture requiring operative repair, right, with routine healing, subsequent encounter  12/19/2015  . Bipolar disease, chronic ([place]) 12/17/2015  . Decubitus ulcer of hip, stage 3 ([place]) 12/11/2015  . Ankle pain   . Encephalopathy   . Malnutrition of moderate degree 01/29/2015  . Altered mental status 01/26/2015  . Nausea and vomiting 01/17/2015  . Esophageal dysmotilities   . Urinary tract infectious disease   . Weakness 01/14/2015  . Schatzki's ring 01/14/2015  . CKD (chronic kidney disease), stage III 01/14/2015  . Esophagus disorder   . Dysphagia 01/11/2015  . UTI (lower urinary tract infection) 03/30/2014  . Hearing loss, sensorineural 03/04/2014  . Nasal lesion 03/04/2014  . Unspecified constipation 12/12/2012  . Hypokalemia 12/11/2012  . Orthostatic hypotension 12/11/2012  . Arterial tortuosity (aorta) 12/11/2012  . Protein-calorie malnutrition, severe ([place]) 12/10/2012  . Decreased rectal sphincter tone 11/21/2012  . Idiopathic scoliosis 07/08/2012  . Macular degeneration 06/26/2011  . Vision disturbance 06/26/2011  . Glaucoma 06/26/2011  . Anemia of chronic disease 04/17/2011  . Drug-seeking behavior 02/26/2011  . Osteoporosis 02/26/2011  . Depression   . Anxiety   . COPD (chronic obstructive pulmonary disease) ([place])   . GERD (gastroesophageal reflux disease)   . Migraines   . Insomnia, unspecified     CMP     Component Value Date/Time   NA 139 12/29/2015 0515   NA 139 12/29/2015   K 3.8 12/29/2015 0515   CL 107 12/29/2015 0515   CO2 21 (L) 12/29/2015 0515   GLUCOSE 114 (H) 12/29/2015 0515   BUN 10 12/29/2015 0515   BUN 10 12/29/2015   CREATININE 0.55 12/29/2015 0515   CREATININE 0.85 11/09/2013 1135   CALCIUM 8.5 (L) 12/29/2015 0515   PROT 6.4 (L) 12/19/2015 1717   ALBUMIN 2.9 (L) 12/19/2015 1717   AST 61 (H) 12/19/2015 1717   ALT 46 12/19/2015 1717   ALKPHOS 52 12/19/2015 1717   BILITOT 0.6 12/19/2015 1717   GFRNONAA >60 12/29/2015 0515   GFRNONAA 64 11/09/2013 1135   GFRAA >60 12/29/2015 0515   GFRAA 74 11/09/2013 1135    Recent  Labs  11/23/15 1631  12/15/15 0344  12/27/15 0346 12/28/15 12/28/15 0431 12/29/15 12/29/15 0515  NA 144  < > 142  < > 142 139 139 139 139  K 4.6  < > 3.9  < > 3.5 3.2* 3.2*  --  3.8  CL 110  < > 111  < > 110  --  106  --  107  CO2 27  < >  23  < > 23  --  23  --  21*  GLUCOSE 126*  < > 95  < > 106*  --  110*  --  114*  BUN 28*  < > 15  < > 6 12 12 10 10   CREATININE 0.93  < > 0.74  < > 0.50 0.6 0.60 0.6 0.55  CALCIUM 9.0  < > 8.9  < > 8.4*  --  8.3*  --  8.5*  MG 2.1  --  1.5*  --   --   --   --   --   --   < > = values in this interval not displayed.  Recent Labs  12/12/15 0442 12/13/15 0439 12/19/15 1717  AST 20 21 61*  ALT 9* 10* 46  ALKPHOS 58 61 52  BILITOT 0.6 0.9 0.6  PROT 5.3* 5.5* 6.4*  ALBUMIN 2.5* 2.7* 2.9*    Recent Labs  12/13/15 12/13/15 0439  12/19/15 1717  12/25/15 0358 12/26/15 12/26/15 0416 12/27/15 12/27/15 0346  WBC 9.0 9.0  < > 7.8  < > 8.2 5.7 5.7 5.7 5.7  NEUTROABS 7 6.9  --  5.4  --   --   --   --   --   --   HGB 10.8* 10.8*  < > 10.9*  < > 8.4* 8.3* 8.3*  --  8.4*  HCT 34* 33.6*  < > 35.2*  < > 27.3* 26* 25.9*  --  27.1*  MCV  --  93.3  < > 93.9  < > 91.0  --  89.0  --  88.6  PLT 215 215  < > 285  < > 331 357 357  --  406*  < > = values in this interval not displayed. No results for input(s): CHOL, LDLCALC, TRIG in the last 8760 hours.  Invalid input(s): HCL No results found for: MICROALBUR Lab Results  Component Value Date   TSH 0.509 06/26/2011   No results found for: HGBA1C No results found for: CHOL, HDL, LDLCALC, LDLDIRECT, TRIG, CHOLHDL  Significant Diagnostic Results in last 30 days:  No results found.  Assessment and Plan  Glaucoma Chronic and stable; plan to cont cosopt and travatan drops daily  GERD (gastroesophageal reflux disease) Pt is controlled on protonix 40 mg daily; pt has zantac BID prn, but doesn't use it much  COPD (chronic obstructive pulmonary disease) (HCC) No recent exacerbations;pt stable on prn  alnbutrol     Taylor Delaine. [name], MD

## 2016-02-13 ENCOUNTER — Encounter: Payer: Self-pay | Admitting: Internal Medicine

## 2016-02-19 ENCOUNTER — Encounter: Payer: Self-pay | Admitting: Internal Medicine

## 2016-02-19 NOTE — Assessment & Plan Note (Signed)
Pt is controlled on protonix 40 mg daily; pt has zantac BID prn, but doesn't use it much

## 2016-02-19 NOTE — Assessment & Plan Note (Signed)
No recent exacerbations;pt stable on prn alnbutrol

## 2016-02-19 NOTE — Assessment & Plan Note (Signed)
Chronic and stable; plan to cont cosopt and travatan drops daily

## 2016-03-06 ENCOUNTER — Non-Acute Institutional Stay (SKILLED_NURSING_FACILITY): Payer: Medicare Other | Admitting: Internal Medicine

## 2016-03-06 ENCOUNTER — Encounter: Payer: Self-pay | Admitting: Internal Medicine

## 2016-03-06 DIAGNOSIS — F319 Bipolar disorder, unspecified: Secondary | ICD-10-CM

## 2016-03-06 DIAGNOSIS — L659 Nonscarring hair loss, unspecified: Secondary | ICD-10-CM

## 2016-03-06 NOTE — Progress Notes (Signed)
Location:  [facility] Room Number: 099I Place of Service:  SNF [phone])  [name], MD  Patient Care Team: Pcp Not In System as PCP - General [name], MD (Gastroenterology) [name], NP (Obstetrics and Gynecology)  Extended Emergency Contact Information Primary Emergency Contact: [name],[name] Address: [place], [place] Phone: [phone] Mobile Phone: [phone] Relation: Daughter Secondary Emergency Contact: [name] Phone: [phone] Mobile Phone: [phone] Relation: Daughter    Allergies: Macrobid [nitrofurantoin macrocrystal]; Azithromycin; Doxycycline; Escitalopram oxalate; Fioricet [butalbital-apap-caffeine]; Latex; Levofloxacin; Nsaids; Oxycodone; Restasis [cyclosporine]; Sulfa antibiotics; and Biaxin [clarithromycin]  Chief Complaint  Patient presents with  . Acute Visit    Acute    HPI: Patient is 83 y.o. female who is being seen because she reports that pt's hair is falling out because of her medications that she came home from hospital with. She is quite insistent.  Past Medical History:  Diagnosis Date  . Anxiety   . Arterial tortuosity (aorta) 12/11/2012  . Asthma   . Chronic kidney disease, stage IV (severe) ([place]) 12/11/2012  . Chronic mental illness   . Colitis   . COPD (chronic obstructive pulmonary disease) ([place])   . Depression   . Dysphagia 01/11/2015  . GERD (gastroesophageal reflux disease)   . Insomnia, unspecified   . Migraines   . Nasal bone fracture 03/30/2014  . Osteoporosis   . Schatzki's ring 01/14/2015  . Scoliosis   . Substance abuse    Benbzos and Narcotics, she does not get any of those medicines from [facility], we have empathetically told her that.  . Vertigo   . Vitamin B deficiency   . Weight loss     Past Surgical History:  Procedure Laterality Date  . ABDOMINAL HYSTERECTOMY     . CATARACT EXTRACTION    . CHOLECYSTECTOMY    . ESOPHAGOGASTRODUODENOSCOPY (EGD) WITH PROPOFOL N/A 01/13/2015   Procedure: ESOPHAGOGASTRODUODENOSCOPY (EGD) WITH PROPOFOL;  Surgeon: [name], MD;  [facility];  Service: Endoscopy;  Laterality: N/A;  . HIP ARTHROPLASTY Right 12/21/2015   Procedure: ARTHROPLASTY BIPOLAR HIP (HEMIARTHROPLASTY);  Surgeon: [name], MD;  Location: [facility];  Service: Orthopedics;  Laterality: Right;  . PARTIAL HYSTERECTOMY      Allergies as of 03/06/2016      Reactions   Macrobid [nitrofurantoin Macrocrystal] Rash   Azithromycin Other (See Comments)   unknown   Doxycycline Other (See Comments)   unknown   Escitalopram Oxalate Other (See Comments)   unknown   Fioricet [butalbital-apap-caffeine] Other (See Comments)   Drunk.   Latex Other (See Comments)   unknown   Levofloxacin Other (See Comments)   unknown   Nsaids Other (See Comments)   This is not an allergy. The patient was told by Dr. Rockne Menghini to avoid NSAIDs and stop Vicoprofen in to 2014 to avoid nephrotoxicity.  Allergic reaction not listed on MAR.   Oxycodone Other (See Comments)   reaction to synthetic codeine   Restasis [cyclosporine] Other (See Comments)   unknown   Sulfa Antibiotics Other (See Comments)   Reaction unknown   Biaxin [clarithromycin] Rash   With burning sensation      Medication List       Accurate as of 03/06/16  2:05 PM. Always use your most recent med list.          acetaminophen 325 MG  tablet Commonly known as:  TYLENOL Take 650 mg by mouth every 6 (six) hours as needed for headache.   albuterol 108 (90 Base) MCG/ACT inhaler Commonly known as:  PROVENTIL HFA;VENTOLIN HFA Inhale 1 puff into the lungs every 6 (six) hours as needed for shortness of breath.   ASPERCREME 10 % Lotn Generic drug:  Trolamine Salicylate Apply 1 application topically 2 (two) times daily as needed (for painful areas).   Cranberry 425 MG Caps Take 425 mg by  mouth 2 (two) times daily.   divalproex 500 MG 24 hr tablet Commonly known as:  DEPAKOTE ER Take 500 mg by mouth daily with breakfast.   divalproex 250 MG 24 hr tablet Commonly known as:  DEPAKOTE ER Take 250 mg by mouth every evening.   docusate sodium 100 MG capsule Commonly known as:  COLACE Take 100 mg by mouth 2 (two) times daily.   dorzolamide-timolol 22.3-6.8 MG/ML ophthalmic solution Commonly known as:  COSOPT Place 1 drop into both eyes 2 (two) times daily.   feeding supplement Liqd Take 1 Container by mouth 2 (two) times daily between meals.   NUTRITIONAL SUPPLEMENT Liqd Initiate 8 oz of Med Pass by mouth 2 times daily to support weight status   ENSURE Take 237 mLs by mouth 2 (two) times daily between meals.   fludrocortisone 0.1 MG tablet Commonly known as:  FLORINEF Take 0.1 mg by mouth every morning.   lactulose 10 GM/15ML solution Commonly known as:  CHRONULAC Take 15 mLs (10 g total) by mouth daily as needed for severe constipation.   lidocaine 5 % Commonly known as:  LIDODERM Place 1 patch onto the skin every 12 (twelve) hours as needed (for pain). Remove & Discard patch within 12 hours or as directed by MD   metoprolol tartrate 25 MG tablet Commonly known as:  LOPRESSOR Take 25 mg by mouth 2 (two) times daily.   nitroGLYCERIN 0.4 MG SL tablet Commonly known as:  NITROSTAT Place 0.4 mg under the tongue every 5 (five) minutes as needed for chest pain. May use 3 times   pantoprazole 40 MG tablet Commonly known as:  PROTONIX Take 1 tablet (40 mg total) by mouth daily at 6 (six) AM.   polyethylene glycol packet Commonly known as:  MIRALAX / GLYCOLAX Take 17 g by mouth daily.   ranitidine 150 MG tablet Commonly known as:  ZANTAC Take 150 mg by mouth 2 (two) times daily as needed for heartburn.   senna 8.6 MG tablet Commonly known as:  SENOKOT Take 2 tablets by mouth 2 (two) times daily as needed for constipation.   traMADol 50 MG  tablet Commonly known as:  ULTRAM Take 1 tablet (50 mg total) by mouth every 6 (six) hours as needed for moderate pain or severe pain.   travoprost (benzalkonium) 0.004 % ophthalmic solution Commonly known as:  TRAVATAN Place 1 drop into both eyes at bedtime.   Vitamin D 1000 units capsule Take 1,000 Units by mouth daily with breakfast.       No orders of the defined types were placed in this encounter.   Immunization History  Administered Date(s) Administered  . Influenza Split 11/13/2011  . Influenza-Unspecified 02/13/2008, 11/30/2013  . PPD Test 12/15/2015  . Pneumococcal-Unspecified 02/13/2008  . Td 02/12/2009    Social History  Substance Use Topics  . Smoking status: Former Smoker    Packs/day: 0.50    Types: Cigarettes  . Smokeless tobacco: Never Used  . Alcohol use No  Review of Systems  UTO 2/2 dementia; nursing as per HPI    Vitals:   03/06/16 1400  BP: (!) 178/88  Pulse: 68  Resp: 20  Temp: 97 F (36.1 C)   Body mass index is 17.61 kg/m. Physical Exam  GENERAL APPEARANCE: Alert, min  conversant, No acute distress  SKIN: No diaphoresis rash HEENT: Unremarkable RESPIRATORY: Breathing is even, unlabored. Lung sounds are clear   CARDIOVASCULAR: Heart RRR no murmurs, rubs or gallops. No peripheral edema  GASTROINTESTINAL: Abdomen is soft, non-tender, not distended w/ normal bowel sounds.  GENITOURINARY: Bladder non tender, not distended  MUSCULOSKELETAL: No abnormal joints or musculature NEUROLOGIC: Cranial nerves 2-12 grossly intact. Moves all extremities PSYCHIATRIC: Mood and affect odd, with dementia, no behavioral issues  Patient Active Problem List   Diagnosis Date Noted  . S/P ORIF (open reduction internal fixation) fracture 01/08/2016  . Oral thrush 01/08/2016  . HTN (hypertension) 01/08/2016  . Closed hip fracture requiring operative repair, right, with routine healing, subsequent encounter 12/19/2015  . Bipolar disease, chronic ([place])  12/17/2015  . Decubitus ulcer of hip, stage 3 ([place]) 12/11/2015  . Ankle pain   . Encephalopathy   . Malnutrition of moderate degree 01/29/2015  . Altered mental status 01/26/2015  . Nausea and vomiting 01/17/2015  . Esophageal dysmotilities   . Urinary tract infectious disease   . Weakness 01/14/2015  . Schatzki's ring 01/14/2015  . CKD (chronic kidney disease), stage III 01/14/2015  . Esophagus disorder   . Dysphagia 01/11/2015  . UTI (lower urinary tract infection) 03/30/2014  . Hearing loss, sensorineural 03/04/2014  . Nasal lesion 03/04/2014  . Unspecified constipation 12/12/2012  . Hypokalemia 12/11/2012  . Orthostatic hypotension 12/11/2012  . Arterial tortuosity (aorta) 12/11/2012  . Protein-calorie malnutrition, severe ([place]) 12/10/2012  . Decreased rectal sphincter tone 11/21/2012  . Idiopathic scoliosis 07/08/2012  . Macular degeneration 06/26/2011  . Vision disturbance 06/26/2011  . Glaucoma 06/26/2011  . Anemia of chronic disease 04/17/2011  . Drug-seeking behavior 02/26/2011  . Osteoporosis 02/26/2011  . Depression   . Anxiety   . COPD (chronic obstructive pulmonary disease) ([place])   . GERD (gastroesophageal reflux disease)   . Migraines   . Insomnia, unspecified     CMP     Component Value Date/Time   NA 139 12/29/2015 0515   NA 139 12/29/2015   K 3.8 12/29/2015 0515   CL 107 12/29/2015 0515   CO2 21 (L) 12/29/2015 0515   GLUCOSE 114 (H) 12/29/2015 0515   BUN 10 12/29/2015 0515   BUN 10 12/29/2015   CREATININE 0.55 12/29/2015 0515   CREATININE 0.85 11/09/2013 1135   CALCIUM 8.5 (L) 12/29/2015 0515   PROT 6.4 (L) 12/19/2015 1717   ALBUMIN 2.9 (L) 12/19/2015 1717   AST 61 (H) 12/19/2015 1717   ALT 46 12/19/2015 1717   ALKPHOS 52 12/19/2015 1717   BILITOT 0.6 12/19/2015 1717   GFRNONAA >60 12/29/2015 0515   GFRNONAA 64 11/09/2013 1135   GFRAA >60 12/29/2015 0515   GFRAA 74 11/09/2013 1135    Recent Labs  11/23/15 1631  12/15/15 0344   12/27/15 0346 12/28/15 12/28/15 0431 12/29/15 12/29/15 0515  NA 144  < > 142  < > 142 139 139 139 139  K 4.6  < > 3.9  < > 3.5 3.2* 3.2*  --  3.8  CL 110  < > 111  < > 110  --  106  --  107  CO2 27  < > 23  < >  23  --  23  --  21*  GLUCOSE 126*  < > 95  < > 106*  --  110*  --  114*  BUN 28*  < > 15  < > 6 12 12 10 10   CREATININE 0.93  < > 0.74  < > 0.50 0.6 0.60 0.6 0.55  CALCIUM 9.0  < > 8.9  < > 8.4*  --  8.3*  --  8.5*  MG 2.1  --  1.5*  --   --   --   --   --   --   < > = values in this interval not displayed.  Recent Labs  12/12/15 0442 12/13/15 0439 12/19/15 1717  AST 20 21 61*  ALT 9* 10* 46  ALKPHOS 58 61 52  BILITOT 0.6 0.9 0.6  PROT 5.3* 5.5* 6.4*  ALBUMIN 2.5* 2.7* 2.9*    Recent Labs  12/13/15 12/13/15 0439  12/19/15 1717  12/25/15 0358 12/26/15 12/26/15 0416 12/27/15 12/27/15 0346  WBC 9.0 9.0  < > 7.8  < > 8.2 5.7 5.7 5.7 5.7  NEUTROABS 7 6.9  --  5.4  --   --   --   --   --   --   HGB 10.8* 10.8*  < > 10.9*  < > 8.4* 8.3* 8.3*  --  8.4*  HCT 34* 33.6*  < > 35.2*  < > 27.3* 26* 25.9*  --  27.1*  MCV  --  93.3  < > 93.9  < > 91.0  --  89.0  --  88.6  PLT 215 215  < > 285  < > 331 357 357  --  406*  < > = values in this interval not displayed. No results for input(s): CHOL, LDLCALC, TRIG in the last 8760 hours.  Invalid input(s): HCL No results found for: MICROALBUR Lab Results  Component Value Date   TSH 0.509 06/26/2011   No results found for: HGBA1C No results found for: CHOL, HDL, LDLCALC, LDLDIRECT, TRIG, CHOLHDL  Significant Diagnostic Results in last 30 days:  No results found.  Assessment and Plan  ALOPECIA/BIPOLAR DISORDER - last admit pt was taken off seroquel and placed on depakote. One of the SE of depakote is alopecia. Will be tapering depakote 250 mg ER for 7 days, then 250 mg , not ER, BID for 7 days, then 250 mg daily for 7 days then off. Will start seroquel 50 mg daily for 3 days, 100 mg daily for 3 days then 150 mg daily, which  was pt's dose prior; will monitor    Anne D. [name], MD

## 2016-03-09 ENCOUNTER — Non-Acute Institutional Stay (SKILLED_NURSING_FACILITY): Payer: Medicare Other | Admitting: Internal Medicine

## 2016-03-09 ENCOUNTER — Encounter: Payer: Self-pay | Admitting: Internal Medicine

## 2016-03-09 DIAGNOSIS — M6281 Muscle weakness (generalized): Secondary | ICD-10-CM | POA: Diagnosis not present

## 2016-03-09 DIAGNOSIS — S72091D Other fracture of head and neck of right femur, subsequent encounter for closed fracture with routine healing: Secondary | ICD-10-CM | POA: Diagnosis not present

## 2016-03-09 DIAGNOSIS — F0280 Dementia in other diseases classified elsewhere without behavioral disturbance: Secondary | ICD-10-CM | POA: Diagnosis not present

## 2016-03-09 DIAGNOSIS — Z96641 Presence of right artificial hip joint: Secondary | ICD-10-CM | POA: Diagnosis not present

## 2016-03-09 DIAGNOSIS — G301 Alzheimer's disease with late onset: Secondary | ICD-10-CM | POA: Diagnosis not present

## 2016-03-09 DIAGNOSIS — R488 Other symbolic dysfunctions: Secondary | ICD-10-CM | POA: Diagnosis not present

## 2016-03-09 DIAGNOSIS — R1312 Dysphagia, oropharyngeal phase: Secondary | ICD-10-CM | POA: Diagnosis not present

## 2016-03-09 DIAGNOSIS — R2681 Unsteadiness on feet: Secondary | ICD-10-CM | POA: Diagnosis not present

## 2016-03-09 NOTE — Progress Notes (Signed)
Location:  [facility] Room Number: 527P Place of Service:  SNF [phone])  [name], MD  Patient Care Team: Pcp Not In System as PCP - General [name], MD (Gastroenterology) [name], NP (Obstetrics and Gynecology)  Extended Emergency Contact Information Primary Emergency Contact: [name],[name] Address: [place], [place] Phone: [phone] Mobile Phone: [phone] Relation: Daughter Secondary Emergency Contact: [name] Phone: [phone] Mobile Phone: [phone] Relation: Daughter    Allergies: Macrobid [nitrofurantoin macrocrystal]; Azithromycin; Doxycycline; Escitalopram oxalate; Fioricet [butalbital-apap-caffeine]; Latex; Levofloxacin; Nsaids; Oxycodone; Restasis [cyclosporine]; Sulfa antibiotics; and Biaxin [clarithromycin]  Chief Complaint  Patient presents with  . Acute Visit    Acute    HPI: Patient is 83 y.o. female who is being seen today in preparation for a family meeting with pt's daughter. Daughter will be meeting with rehab as well. There is a large discrepancy between what pt can actually do and what daughter thinks she should be doing. I have asked ST to do a MOCA on pt in preparation fot this meeting.  Past Medical History:  Diagnosis Date  . Anxiety   . Arterial tortuosity (aorta) 12/11/2012  . Asthma   . Chronic kidney disease, stage IV (severe) ([place]) 12/11/2012  . Chronic mental illness   . Colitis   . COPD (chronic obstructive pulmonary disease) ([place])   . Depression   . Dysphagia 01/11/2015  . GERD (gastroesophageal reflux disease)   . Insomnia, unspecified   . Migraines   . Nasal bone fracture 03/30/2014  . Osteoporosis   . Schatzki's ring 01/14/2015  . Scoliosis   . Substance abuse    Benbzos and Narcotics, she does not get any of those medicines from [facility], we have empathetically told her  that.  . Vertigo   . Vitamin B deficiency   . Weight loss     Past Surgical History:  Procedure Laterality Date  . ABDOMINAL HYSTERECTOMY    . CATARACT EXTRACTION    . CHOLECYSTECTOMY    . ESOPHAGOGASTRODUODENOSCOPY (EGD) WITH PROPOFOL N/A 01/13/2015   Procedure: ESOPHAGOGASTRODUODENOSCOPY (EGD) WITH PROPOFOL;  Surgeon: [name], MD;  Location: [facility];  Service: Endoscopy;  Laterality: N/A;  . HIP ARTHROPLASTY Right 12/21/2015   Procedure: ARTHROPLASTY BIPOLAR HIP (HEMIARTHROPLASTY);  Surgeon: [name], MD;  Location: [facility];  Service: Orthopedics;  Laterality: Right;  . PARTIAL HYSTERECTOMY      Allergies as of 03/09/2016      Reactions   Macrobid [nitrofurantoin Macrocrystal] Rash   Azithromycin Other (See Comments)   unknown   Doxycycline Other (See Comments)   unknown   Escitalopram Oxalate Other (See Comments)   unknown   Fioricet [butalbital-apap-caffeine] Other (See Comments)   Drunk.   Latex Other (See Comments)   unknown   Levofloxacin Other (See Comments)   unknown   Nsaids Other (See Comments)   This is not an allergy. The patient was told by Dr. Rockne Menghini to avoid NSAIDs and stop Vicoprofen in to 2014 to avoid nephrotoxicity.  Allergic reaction not listed on MAR.   Oxycodone Other (See Comments)   reaction to synthetic codeine   Restasis [cyclosporine] Other (See Comments)   unknown   Sulfa Antibiotics Other (See Comments)   Reaction unknown   Biaxin [clarithromycin] Rash   With burning sensation      Medication List  Accurate as of 03/09/16 11:59 PM. Always use your most recent med list.          acetaminophen 325 MG tablet Commonly known as:  TYLENOL Take 650 mg by mouth every 6 (six) hours as needed for headache.   albuterol 108 (90 Base) MCG/ACT inhaler Commonly known as:  PROVENTIL HFA;VENTOLIN HFA Inhale 1 puff into the lungs every 6 (six) hours as needed for shortness of breath.   ASPERCREME 10 % Lotn Generic drug:   Trolamine Salicylate Apply 1 application topically 2 (two) times daily as needed (for painful areas).   Cranberry 425 MG Caps Take 425 mg by mouth 2 (two) times daily.   divalproex 500 MG 24 hr tablet Commonly known as:  DEPAKOTE ER Take 500 mg by mouth daily with breakfast.   divalproex 250 MG 24 hr tablet Commonly known as:  DEPAKOTE ER Take 250 mg by mouth every evening.   docusate sodium 100 MG capsule Commonly known as:  COLACE Take 100 mg by mouth 2 (two) times daily.   dorzolamide-timolol 22.3-6.8 MG/ML ophthalmic solution Commonly known as:  COSOPT Place 1 drop into both eyes 2 (two) times daily.   feeding supplement Liqd Take 1 Container by mouth 2 (two) times daily between meals.   NUTRITIONAL SUPPLEMENT Liqd Initiate 8 oz of Med Pass by mouth 2 times daily to support weight status   ENSURE Take 237 mLs by mouth 2 (two) times daily between meals.   fludrocortisone 0.1 MG tablet Commonly known as:  FLORINEF Take 0.1 mg by mouth every morning.   lactulose 10 GM/15ML solution Commonly known as:  CHRONULAC Take 15 mLs (10 g total) by mouth daily as needed for severe constipation.   lidocaine 5 % Commonly known as:  LIDODERM Place 1 patch onto the skin every 12 (twelve) hours as needed (for pain). Remove & Discard patch within 12 hours or as directed by MD   metoprolol tartrate 25 MG tablet Commonly known as:  LOPRESSOR Take 25 mg by mouth 2 (two) times daily.   nitroGLYCERIN 0.4 MG SL tablet Commonly known as:  NITROSTAT Place 0.4 mg under the tongue every 5 (five) minutes as needed for chest pain. May use 3 times   pantoprazole 40 MG tablet Commonly known as:  PROTONIX Take 1 tablet (40 mg total) by mouth daily at 6 (six) AM.   polyethylene glycol packet Commonly known as:  MIRALAX / GLYCOLAX Take 17 g by mouth daily.   QUEtiapine 100 MG tablet Commonly known as:  SEROQUEL Take 100 mg by mouth at bedtime.   ranitidine 150 MG tablet Commonly known  as:  ZANTAC Take 150 mg by mouth 2 (two) times daily as needed for heartburn.   senna 8.6 MG tablet Commonly known as:  SENOKOT Take 2 tablets by mouth 2 (two) times daily as needed for constipation.   traMADol 50 MG tablet Commonly known as:  ULTRAM Take 1 tablet (50 mg total) by mouth every 6 (six) hours as needed for moderate pain or severe pain.   travoprost (benzalkonium) 0.004 % ophthalmic solution Commonly known as:  TRAVATAN Place 1 drop into both eyes at bedtime.   Vitamin D 1000 units capsule Take 1,000 Units by mouth daily with breakfast.       Meds ordered this encounter  Medications  . DISCONTD: QUEtiapine (SEROQUEL) 100 MG tablet    Sig: Take 100 mg by mouth at bedtime.    Immunization History  Administered Date(s) Administered  .  Influenza Split 11/13/2011  . Influenza-Unspecified 02/13/2008, 11/30/2013  . PPD Test 12/15/2015  . Pneumococcal-Unspecified 02/13/2008  . Td 02/12/2009    Social History  Substance Use Topics  . Smoking status: Former Smoker    Packs/day: 0.50    Types: Cigarettes  . Smokeless tobacco: Never Used  . Alcohol use No    Review of Systems  DATA OBTAINED: from patient- contributes in very limited way; nursing without concerns GENERAL:  no fevers, fatigue, appetite changes SKIN: No itching, rash HEENT: No complaint RESPIRATORY: No cough, wheezing, SOB CARDIAC: No chest pain, palpitations, lower extremity edema  GI: No abdominal pain, No N/V/D or constipation, No heartburn or reflux  GU: No dysuria, frequency or urgency, or incontinence  MUSCULOSKELETAL: No unrelieved bone/joint pain NEUROLOGIC: No headache, dizziness  PSYCHIATRIC: No overt anxiety or sadness  Vitals:   03/09/16 1450  BP: (!) 162/89  Pulse: 73  Resp: 20  Temp: 97.7 F (36.5 C)   Body mass index is 17.61 kg/m. Physical Exam  GENERAL APPEARANCE: Alert, min conversant, No acute distress  SKIN: No diaphoresis rash HEENT:  Unremarkable RESPIRATORY: Breathing is even, unlabored. Lung sounds are clear   CARDIOVASCULAR: Heart RRR no murmurs, rubs or gallops. No peripheral edema  GASTROINTESTINAL: Abdomen is soft, non-tender, not distended w/ normal bowel sounds.  GENITOURINARY: Bladder non tender, not distended  MUSCULOSKELETAL: No abnormal joints; very thin NEUROLOGIC: Cranial nerves 2-12 grossly intact. Moves all extremities PSYCHIATRIC: dementia, no behavioral issues  Patient Active Problem List   Diagnosis Date Noted  . S/P ORIF (open reduction internal fixation) fracture 01/08/2016  . Oral thrush 01/08/2016  . HTN (hypertension) 01/08/2016  . Closed hip fracture requiring operative repair, right, with routine healing, subsequent encounter 12/19/2015  . Bipolar disease, chronic ([place]) 12/17/2015  . Decubitus ulcer of hip, stage 3 ([place]) 12/11/2015  . Ankle pain   . Encephalopathy   . Malnutrition of moderate degree 01/29/2015  . Altered mental status 01/26/2015  . Nausea and vomiting 01/17/2015  . Esophageal dysmotilities   . Urinary tract infectious disease   . Weakness 01/14/2015  . Schatzki's ring 01/14/2015  . CKD (chronic kidney disease), stage III 01/14/2015  . Esophagus disorder   . Dysphagia 01/11/2015  . UTI (lower urinary tract infection) 03/30/2014  . Hearing loss, sensorineural 03/04/2014  . Nasal lesion 03/04/2014  . Unspecified constipation 12/12/2012  . Hypokalemia 12/11/2012  . Orthostatic hypotension 12/11/2012  . Arterial tortuosity (aorta) 12/11/2012  . Protein-calorie malnutrition, severe ([place]) 12/10/2012  . Decreased rectal sphincter tone 11/21/2012  . Idiopathic scoliosis 07/08/2012  . Macular degeneration 06/26/2011  . Vision disturbance 06/26/2011  . Glaucoma 06/26/2011  . Anemia of chronic disease 04/17/2011  . Drug-seeking behavior 02/26/2011  . Osteoporosis 02/26/2011  . Depression   . Anxiety   . COPD (chronic obstructive pulmonary disease) ([place])   . GERD  (gastroesophageal reflux disease)   . Migraines   . Insomnia, unspecified     CMP     Component Value Date/Time   NA 139 12/29/2015 0515   NA 139 12/29/2015   K 3.8 12/29/2015 0515   CL 107 12/29/2015 0515   CO2 21 (L) 12/29/2015 0515   GLUCOSE 114 (H) 12/29/2015 0515   BUN 10 12/29/2015 0515   BUN 10 12/29/2015   CREATININE 0.55 12/29/2015 0515   CREATININE 0.85 11/09/2013 1135   CALCIUM 8.5 (L) 12/29/2015 0515   PROT 6.4 (L) 12/19/2015 1717   ALBUMIN 2.9 (L) 12/19/2015 1717   AST 61 (  H) 12/19/2015 1717   ALT 46 12/19/2015 1717   ALKPHOS 52 12/19/2015 1717   BILITOT 0.6 12/19/2015 1717   GFRNONAA >60 12/29/2015 0515   GFRNONAA 64 11/09/2013 1135   GFRAA >60 12/29/2015 0515   GFRAA 74 11/09/2013 1135    Recent Labs  11/23/15 1631  12/15/15 0344  12/27/15 0346 12/28/15 12/28/15 0431 12/29/15 12/29/15 0515  NA 144  < > 142  < > 142 139 139 139 139  K 4.6  < > 3.9  < > 3.5 3.2* 3.2*  --  3.8  CL 110  < > 111  < > 110  --  106  --  107  CO2 27  < > 23  < > 23  --  23  --  21*  GLUCOSE 126*  < > 95  < > 106*  --  110*  --  114*  BUN 28*  < > 15  < > 6 12 12 10 10   CREATININE 0.93  < > 0.74  < > 0.50 0.6 0.60 0.6 0.55  CALCIUM 9.0  < > 8.9  < > 8.4*  --  8.3*  --  8.5*  MG 2.1  --  1.5*  --   --   --   --   --   --   < > = values in this interval not displayed.  Recent Labs  12/12/15 0442 12/13/15 0439 12/19/15 1717  AST 20 21 61*  ALT 9* 10* 46  ALKPHOS 58 61 52  BILITOT 0.6 0.9 0.6  PROT 5.3* 5.5* 6.4*  ALBUMIN 2.5* 2.7* 2.9*    Recent Labs  12/13/15 12/13/15 0439  12/19/15 1717  12/25/15 0358 12/26/15 12/26/15 0416 12/27/15 12/27/15 0346  WBC 9.0 9.0  < > 7.8  < > 8.2 5.7 5.7 5.7 5.7  NEUTROABS 7 6.9  --  5.4  --   --   --   --   --   --   HGB 10.8* 10.8*  < > 10.9*  < > 8.4* 8.3* 8.3*  --  8.4*  HCT 34* 33.6*  < > 35.2*  < > 27.3* 26* 25.9*  --  27.1*  MCV  --  93.3  < > 93.9  < > 91.0  --  89.0  --  88.6  PLT 215 215  < > 285  < > 331 357 357   --  406*  < > = values in this interval not displayed. No results for input(s): CHOL, LDLCALC, TRIG in the last 8760 hours.  Invalid input(s): HCL No results found for: MICROALBUR Lab Results  Component Value Date   TSH 0.509 06/26/2011   No results found for: HGBA1C No results found for: CHOL, HDL, LDLCALC, LDLDIRECT, TRIG, CHOLHDL  Significant Diagnostic Results in last 30 days:  No results found.  Assessment and Plan  DEMENTIA WITHOUT BEHAVOIRS - pt scored a 9/30 on MOCA, rehab reports that pt is not participation well in rehab because of the mental component of therapy; DON, Archie Endo and I spoke with the daughter at length about pt's undeniable dementia; all questions and concerns were answered/discussed; daughter seemed to be more accepting of problem.    Time spent > 35 min;> 50% of time with patient was spent reviewing records, labs, tests and studies, counseling and developing plan of care  [name], MD

## 2016-03-10 DIAGNOSIS — S72091D Other fracture of head and neck of right femur, subsequent encounter for closed fracture with routine healing: Secondary | ICD-10-CM | POA: Diagnosis not present

## 2016-03-10 DIAGNOSIS — Z96641 Presence of right artificial hip joint: Secondary | ICD-10-CM | POA: Diagnosis not present

## 2016-03-10 DIAGNOSIS — M6281 Muscle weakness (generalized): Secondary | ICD-10-CM | POA: Diagnosis not present

## 2016-03-10 DIAGNOSIS — R488 Other symbolic dysfunctions: Secondary | ICD-10-CM | POA: Diagnosis not present

## 2016-03-10 DIAGNOSIS — R1312 Dysphagia, oropharyngeal phase: Secondary | ICD-10-CM | POA: Diagnosis not present

## 2016-03-10 DIAGNOSIS — R2681 Unsteadiness on feet: Secondary | ICD-10-CM | POA: Diagnosis not present

## 2016-03-11 DIAGNOSIS — M6281 Muscle weakness (generalized): Secondary | ICD-10-CM | POA: Diagnosis not present

## 2016-03-11 DIAGNOSIS — S72091D Other fracture of head and neck of right femur, subsequent encounter for closed fracture with routine healing: Secondary | ICD-10-CM | POA: Diagnosis not present

## 2016-03-11 DIAGNOSIS — R2681 Unsteadiness on feet: Secondary | ICD-10-CM | POA: Diagnosis not present

## 2016-03-11 DIAGNOSIS — Z96641 Presence of right artificial hip joint: Secondary | ICD-10-CM | POA: Diagnosis not present

## 2016-03-11 DIAGNOSIS — R488 Other symbolic dysfunctions: Secondary | ICD-10-CM | POA: Diagnosis not present

## 2016-03-11 DIAGNOSIS — R1312 Dysphagia, oropharyngeal phase: Secondary | ICD-10-CM | POA: Diagnosis not present

## 2016-03-12 DIAGNOSIS — R1312 Dysphagia, oropharyngeal phase: Secondary | ICD-10-CM | POA: Diagnosis not present

## 2016-03-12 DIAGNOSIS — S72091D Other fracture of head and neck of right femur, subsequent encounter for closed fracture with routine healing: Secondary | ICD-10-CM | POA: Diagnosis not present

## 2016-03-12 DIAGNOSIS — R488 Other symbolic dysfunctions: Secondary | ICD-10-CM | POA: Diagnosis not present

## 2016-03-12 DIAGNOSIS — M6281 Muscle weakness (generalized): Secondary | ICD-10-CM | POA: Diagnosis not present

## 2016-03-12 DIAGNOSIS — R2681 Unsteadiness on feet: Secondary | ICD-10-CM | POA: Diagnosis not present

## 2016-03-12 DIAGNOSIS — Z96641 Presence of right artificial hip joint: Secondary | ICD-10-CM | POA: Diagnosis not present

## 2016-03-13 ENCOUNTER — Encounter: Payer: Self-pay | Admitting: Internal Medicine

## 2016-03-13 ENCOUNTER — Non-Acute Institutional Stay (SKILLED_NURSING_FACILITY): Payer: Medicare Other | Admitting: Internal Medicine

## 2016-03-13 DIAGNOSIS — S72091D Other fracture of head and neck of right femur, subsequent encounter for closed fracture with routine healing: Secondary | ICD-10-CM | POA: Diagnosis not present

## 2016-03-13 DIAGNOSIS — R2681 Unsteadiness on feet: Secondary | ICD-10-CM | POA: Diagnosis not present

## 2016-03-13 DIAGNOSIS — I10 Essential (primary) hypertension: Secondary | ICD-10-CM

## 2016-03-13 DIAGNOSIS — F319 Bipolar disorder, unspecified: Secondary | ICD-10-CM | POA: Diagnosis not present

## 2016-03-13 DIAGNOSIS — R488 Other symbolic dysfunctions: Secondary | ICD-10-CM | POA: Diagnosis not present

## 2016-03-13 DIAGNOSIS — E876 Hypokalemia: Secondary | ICD-10-CM | POA: Diagnosis not present

## 2016-03-13 DIAGNOSIS — M6281 Muscle weakness (generalized): Secondary | ICD-10-CM | POA: Diagnosis not present

## 2016-03-13 DIAGNOSIS — R1312 Dysphagia, oropharyngeal phase: Secondary | ICD-10-CM | POA: Diagnosis not present

## 2016-03-13 DIAGNOSIS — Z96641 Presence of right artificial hip joint: Secondary | ICD-10-CM | POA: Diagnosis not present

## 2016-03-13 NOTE — Progress Notes (Signed)
Location:  [facility] Room Number: 209O Place of Service:  SNF [id number])  Taylor Roth. Taylor Coil, MD  Patient Care Team: Pcp Not In System as PCP - General [name], MD (Gastroenterology) [name], NP (Obstetrics and Gynecology)  Extended Emergency Contact Information Primary Emergency Contact: [name],[name] Address: [place], [place] Phone: [phone] Mobile Phone: [phone] Relation: Daughter Secondary Emergency Contact: [name] Phone: [phone] Mobile Phone: [phone] Relation: Daughter    Allergies: Macrobid [nitrofurantoin macrocrystal]; Azithromycin; Doxycycline; Escitalopram oxalate; Fioricet [butalbital-apap-caffeine]; Latex; Levofloxacin; Nsaids; Oxycodone; Restasis [cyclosporine]; Sulfa antibiotics; and Biaxin [clarithromycin]  Chief Complaint  Patient presents with  . Medical Management of Chronic Issues    Routine Visit    HPI: Patient is 83 y.o. female who is being seen for routine issues of bipolar dx, HTN and hypokalemia.  Past Medical History:  Diagnosis Date  . Anxiety   . Arterial tortuosity (aorta) 12/11/2012  . Asthma   . Chronic kidney disease, stage IV (severe) ([place]) 12/11/2012  . Chronic mental illness   . Colitis   . COPD (chronic obstructive pulmonary disease) ([place])   . Depression   . Dysphagia 01/11/2015  . GERD (gastroesophageal reflux disease)   . Insomnia, unspecified   . Migraines   . Nasal bone fracture 03/30/2014  . Osteoporosis   . Schatzki's ring 01/14/2015  . Scoliosis   . Substance abuse    Benbzos and Narcotics, she does not get any of those medicines from [facility], we have empathetically told her that.  . Vertigo   . Vitamin B deficiency   . Weight loss     Past Surgical History:  Procedure Laterality Date  . ABDOMINAL HYSTERECTOMY    . CATARACT EXTRACTION    . CHOLECYSTECTOMY      . ESOPHAGOGASTRODUODENOSCOPY (EGD) WITH PROPOFOL N/A 01/13/2015   Procedure: ESOPHAGOGASTRODUODENOSCOPY (EGD) WITH PROPOFOL;  Surgeon: [name], MD;  Location: [facility];  Service: Endoscopy;  Laterality: N/A;  . HIP ARTHROPLASTY Right 12/21/2015   Procedure: ARTHROPLASTY BIPOLAR HIP (HEMIARTHROPLASTY);  Surgeon: [name], MD;  Location: [facility];  Service: Orthopedics;  Laterality: Right;  . PARTIAL HYSTERECTOMY      Allergies as of 03/13/2016      Reactions   Macrobid [nitrofurantoin Macrocrystal] Rash   Azithromycin Other (See Comments)   unknown   Doxycycline Other (See Comments)   unknown   Escitalopram Oxalate Other (See Comments)   unknown   Fioricet [butalbital-apap-caffeine] Other (See Comments)   Drunk.   Latex Other (See Comments)   unknown   Levofloxacin Other (See Comments)   unknown   Nsaids Other (See Comments)   This is not an allergy. The patient was told by Dr. Rockne Menghini to avoid NSAIDs and stop Vicoprofen in to 2014 to avoid nephrotoxicity.  Allergic reaction not listed on MAR.   Oxycodone Other (See Comments)   reaction to synthetic codeine   Restasis [cyclosporine] Other (See Comments)   unknown   Sulfa Antibiotics Other (See Comments)   Reaction unknown   Biaxin [clarithromycin] Rash   With burning sensation      Medication List       Accurate as of 03/13/16 11:59 PM. Always use your most recent med list.          acetaminophen 325 MG tablet Commonly known as:  TYLENOL Take 650 mg by mouth  every 6 (six) hours as needed for headache.   albuterol 108 (90 Base) MCG/ACT inhaler Commonly known as:  PROVENTIL HFA;VENTOLIN HFA Inhale 1 puff into the lungs every 6 (six) hours as needed for shortness of breath.   ASPERCREME 10 % Lotn Generic drug:  Trolamine Salicylate Apply 1 application topically 2 (two) times daily as needed (for painful areas).   Cranberry 425 MG Caps Take 425 mg by mouth 2 (two) times daily.   divalproex 500 MG 24  hr tablet Commonly known as:  DEPAKOTE ER Take 500 mg by mouth daily with breakfast.   divalproex 250 MG 24 hr tablet Commonly known as:  DEPAKOTE ER Take 250 mg by mouth every evening.   docusate sodium 100 MG capsule Commonly known as:  COLACE Take 100 mg by mouth 2 (two) times daily.   dorzolamide-timolol 22.3-6.8 MG/ML ophthalmic solution Commonly known as:  COSOPT Place 1 drop into both eyes 2 (two) times daily.   feeding supplement Liqd Take 1 Container by mouth 2 (two) times daily between meals.   NUTRITIONAL SUPPLEMENT Liqd Initiate 8 oz of Med Pass by mouth 2 times daily to support weight status   ENSURE Take 237 mLs by mouth 2 (two) times daily between meals.   fludrocortisone 0.1 MG tablet Commonly known as:  FLORINEF Take 0.1 mg by mouth every morning.   lactulose 10 GM/15ML solution Commonly known as:  CHRONULAC Take 15 mLs (10 g total) by mouth daily as needed for severe constipation.   lidocaine 5 % Commonly known as:  LIDODERM Place 1 patch onto the skin every 12 (twelve) hours as needed (for pain). Remove & Discard patch within 12 hours or as directed by MD   metoprolol tartrate 25 MG tablet Commonly known as:  LOPRESSOR Take 25 mg by mouth 2 (two) times daily.   nitroGLYCERIN 0.4 MG SL tablet Commonly known as:  NITROSTAT Place 0.4 mg under the tongue every 5 (five) minutes as needed for chest pain. May use 3 times   pantoprazole 40 MG tablet Commonly known as:  PROTONIX Take 1 tablet (40 mg total) by mouth daily at 6 (six) AM.   polyethylene glycol packet Commonly known as:  MIRALAX / GLYCOLAX Take 17 g by mouth daily.   QUEtiapine 100 MG tablet Commonly known as:  SEROQUEL Take 100 mg by mouth at bedtime.   ranitidine 150 MG tablet Commonly known as:  ZANTAC Take 150 mg by mouth 2 (two) times daily as needed for heartburn.   senna 8.6 MG tablet Commonly known as:  SENOKOT Take 2 tablets by mouth 2 (two) times daily as needed for  constipation.   traMADol 50 MG tablet Commonly known as:  ULTRAM Take 1 tablet (50 mg total) by mouth every 6 (six) hours as needed for moderate pain or severe pain.   travoprost (benzalkonium) 0.004 % ophthalmic solution Commonly known as:  TRAVATAN Place 1 drop into both eyes at bedtime.   Vitamin D 1000 units capsule Take 1,000 Units by mouth daily with breakfast.       No orders of the defined types were placed in this encounter.   Immunization History  Administered Date(s) Administered  . Influenza Split 11/13/2011  . Influenza-Unspecified 02/13/2008, 11/30/2013  . PPD Test 12/15/2015  . Pneumococcal-Unspecified 02/13/2008  . Td 02/12/2009    Social History  Substance Use Topics  . Smoking status: Former Smoker    Packs/day: 0.50    Types: Cigarettes  . Smokeless tobacco: Never  Used  . Alcohol use No    Review of Systems  DATA OBTAINED:  Nurse - no concerns GENERAL:  no fevers, fatigue, appetite changes SKIN: No itching, rash HEENT: No complaint RESPIRATORY: No cough, wheezing, SOB CARDIAC: No chest pain, palpitations, lower extremity edema  GI: No abdominal pain, No N/V/D or constipation, No heartburn or reflux  GU: No dysuria, frequency or urgency, or incontinence  MUSCULOSKELETAL: No unrelieved bone/joint pain NEUROLOGIC: No headache, dizziness  PSYCHIATRIC: No overt anxiety or sadness  Vitals:   03/13/16 0824  BP: (!) 162/89  Pulse: 68  Resp: 20  Temp: 97 F (36.1 C)   Body mass index is 17.61 kg/m. Physical Exam  GENERAL APPEARANCE: Alert, non conversant, No acute distress  SKIN: No diaphoresis rash HEENT: Unremarkable RESPIRATORY: Breathing is even, unlabored. Lung sounds are clear   CARDIOVASCULAR: Heart RRR no murmurs, rubs or gallops. No peripheral edema  GASTROINTESTINAL: Abdomen is soft, non-tender, not distended w/ normal bowel sounds.  GENITOURINARY: Bladder non tender, not distended  MUSCULOSKELETAL: No abnormal joints or  musculature NEUROLOGIC: Cranial nerves 2-12 grossly intact. Moves all extremities PSYCHIATRIC: Mood and affect odd, no behavioral issues  Patient Active Problem List   Diagnosis Date Noted  . Dementia without behavioral disturbance 03/25/2016  . S/P ORIF (open reduction internal fixation) fracture 01/08/2016  . Oral thrush 01/08/2016  . HTN (hypertension) 01/08/2016  . Closed hip fracture requiring operative repair, right, with routine healing, subsequent encounter 12/19/2015  . Bipolar disease, chronic ([place]) 12/17/2015  . Decubitus ulcer of hip, stage 3 ([place]) 12/11/2015  . Ankle pain   . Encephalopathy   . Malnutrition of moderate degree 01/29/2015  . Altered mental status 01/26/2015  . Nausea and vomiting 01/17/2015  . Esophageal dysmotilities   . Urinary tract infectious disease   . Weakness 01/14/2015  . Schatzki's ring 01/14/2015  . CKD (chronic kidney disease), stage III 01/14/2015  . Esophagus disorder   . Dysphagia 01/11/2015  . UTI (lower urinary tract infection) 03/30/2014  . Hearing loss, sensorineural 03/04/2014  . Nasal lesion 03/04/2014  . Unspecified constipation 12/12/2012  . Hypokalemia 12/11/2012  . Orthostatic hypotension 12/11/2012  . Arterial tortuosity (aorta) 12/11/2012  . Protein-calorie malnutrition, severe ([place]) 12/10/2012  . Decreased rectal sphincter tone 11/21/2012  . Idiopathic scoliosis 07/08/2012  . Macular degeneration 06/26/2011  . Vision disturbance 06/26/2011  . Glaucoma 06/26/2011  . Anemia of chronic disease 04/17/2011  . Drug-seeking behavior 02/26/2011  . Osteoporosis 02/26/2011  . Depression   . Anxiety   . COPD (chronic obstructive pulmonary disease) ([place])   . GERD (gastroesophageal reflux disease)   . Migraines   . Insomnia, unspecified     CMP     Component Value Date/Time   NA 139 12/29/2015 0515   NA 139 12/29/2015   K 3.8 12/29/2015 0515   CL 107 12/29/2015 0515   CO2 21 (L) 12/29/2015 0515   GLUCOSE 114 (H)  12/29/2015 0515   BUN 10 12/29/2015 0515   BUN 10 12/29/2015   CREATININE 0.55 12/29/2015 0515   CREATININE 0.85 11/09/2013 1135   CALCIUM 8.5 (L) 12/29/2015 0515   PROT 6.4 (L) 12/19/2015 1717   ALBUMIN 2.9 (L) 12/19/2015 1717   AST 61 (H) 12/19/2015 1717   ALT 46 12/19/2015 1717   ALKPHOS 52 12/19/2015 1717   BILITOT 0.6 12/19/2015 1717   GFRNONAA >60 12/29/2015 0515   GFRNONAA 64 11/09/2013 1135   GFRAA >60 12/29/2015 0515   GFRAA 74 11/09/2013 1135  Recent Labs  11/23/15 1631  12/15/15 0344  12/27/15 0346 12/28/15 12/28/15 0431 12/29/15 12/29/15 0515  NA 144  < > 142  < > 142 139 139 139 139  K 4.6  < > 3.9  < > 3.5 3.2* 3.2*  --  3.8  CL 110  < > 111  < > 110  --  106  --  107  CO2 27  < > 23  < > 23  --  23  --  21*  GLUCOSE 126*  < > 95  < > 106*  --  110*  --  114*  BUN 28*  < > 15  < > 6 12 12 10 10   CREATININE 0.93  < > 0.74  < > 0.50 0.6 0.60 0.6 0.55  CALCIUM 9.0  < > 8.9  < > 8.4*  --  8.3*  --  8.5*  MG 2.1  --  1.5*  --   --   --   --   --   --   < > = values in this interval not displayed.  Recent Labs  12/12/15 0442 12/13/15 0439 12/19/15 1717  AST 20 21 61*  ALT 9* 10* 46  ALKPHOS 58 61 52  BILITOT 0.6 0.9 0.6  PROT 5.3* 5.5* 6.4*  ALBUMIN 2.5* 2.7* 2.9*    Recent Labs  12/13/15 12/13/15 0439  12/19/15 1717  12/25/15 0358 12/26/15 12/26/15 0416 12/27/15 12/27/15 0346  WBC 9.0 9.0  < > 7.8  < > 8.2 5.7 5.7 5.7 5.7  NEUTROABS 7 6.9  --  5.4  --   --   --   --   --   --   HGB 10.8* 10.8*  < > 10.9*  < > 8.4* 8.3* 8.3*  --  8.4*  HCT 34* 33.6*  < > 35.2*  < > 27.3* 26* 25.9*  --  27.1*  MCV  --  93.3  < > 93.9  < > 91.0  --  89.0  --  88.6  PLT 215 215  < > 285  < > 331 357 357  --  406*  < > = values in this interval not displayed. No results for input(s): CHOL, LDLCALC, TRIG in the last 8760 hours.  Invalid input(s): HCL No results found for: MICROALBUR Lab Results  Component Value Date   TSH 0.509 06/26/2011   No results found  for: HGBA1C No results found for: CHOL, HDL, LDLCALC, LDLDIRECT, TRIG, CHOLHDL  Significant Diagnostic Results in last 30 days:  No results found.  Assessment and Plan  Bipolar disease, chronic ([place]) Appears controlled on seroquel 150 mg daily;plan to cont current med.  HTN (hypertension) Not well controled on metoprolol 25 mg BID; will add norvasc 10 mg daily  Exposure to the flu There is a flu outbreak in SNF                               Hypokalemia Pt's  recent K+ is 3.8 without daily supplement;pt is on fliorinef 0.1 mg daily; will monitor at intervals    Exposure to flu is an error; outbreak conditions were not med until 1/31 Webb Silversmith D. Taylor Coil, MD

## 2016-03-14 DIAGNOSIS — R1312 Dysphagia, oropharyngeal phase: Secondary | ICD-10-CM | POA: Diagnosis not present

## 2016-03-14 DIAGNOSIS — Z96641 Presence of right artificial hip joint: Secondary | ICD-10-CM | POA: Diagnosis not present

## 2016-03-14 DIAGNOSIS — R488 Other symbolic dysfunctions: Secondary | ICD-10-CM | POA: Diagnosis not present

## 2016-03-14 DIAGNOSIS — S72091D Other fracture of head and neck of right femur, subsequent encounter for closed fracture with routine healing: Secondary | ICD-10-CM | POA: Diagnosis not present

## 2016-03-14 DIAGNOSIS — R2681 Unsteadiness on feet: Secondary | ICD-10-CM | POA: Diagnosis not present

## 2016-03-14 DIAGNOSIS — M6281 Muscle weakness (generalized): Secondary | ICD-10-CM | POA: Diagnosis not present

## 2016-03-15 ENCOUNTER — Non-Acute Institutional Stay (SKILLED_NURSING_FACILITY): Payer: Medicare Other | Admitting: Internal Medicine

## 2016-03-15 DIAGNOSIS — Z20828 Contact with and (suspected) exposure to other viral communicable diseases: Secondary | ICD-10-CM | POA: Diagnosis not present

## 2016-03-16 ENCOUNTER — Non-Acute Institutional Stay (SKILLED_NURSING_FACILITY): Payer: Medicare Other | Admitting: Internal Medicine

## 2016-03-16 ENCOUNTER — Encounter: Payer: Self-pay | Admitting: Internal Medicine

## 2016-03-16 DIAGNOSIS — L630 Alopecia (capitis) totalis: Secondary | ICD-10-CM

## 2016-03-16 DIAGNOSIS — G252 Other specified forms of tremor: Secondary | ICD-10-CM

## 2016-03-16 DIAGNOSIS — M6281 Muscle weakness (generalized): Secondary | ICD-10-CM | POA: Diagnosis not present

## 2016-03-16 DIAGNOSIS — R2681 Unsteadiness on feet: Secondary | ICD-10-CM | POA: Diagnosis not present

## 2016-03-16 DIAGNOSIS — Z96641 Presence of right artificial hip joint: Secondary | ICD-10-CM | POA: Diagnosis not present

## 2016-03-16 DIAGNOSIS — S72091D Other fracture of head and neck of right femur, subsequent encounter for closed fracture with routine healing: Secondary | ICD-10-CM | POA: Diagnosis not present

## 2016-03-16 NOTE — Progress Notes (Signed)
Location:  [facility] Room Number: 371G Place of Service:  SNF [phone])  [name], MD  Patient Care Team: Pcp Not In System as PCP - General [name], MD (Gastroenterology) [name], NP (Obstetrics and Gynecology)  Extended Emergency Contact Information Primary Emergency Contact: [name],[name] Address: [place], [place] Phone: [phone] Mobile Phone: [phone] Relation: Daughter Secondary Emergency Contact: [name] Phone: [phone] Mobile Phone: [phone] Relation: Daughter    Allergies: Macrobid [nitrofurantoin macrocrystal]; Azithromycin; Doxycycline; Escitalopram oxalate; Fioricet [butalbital-apap-caffeine]; Latex; Levofloxacin; Nsaids; Oxycodone; Restasis [cyclosporine]; Sulfa antibiotics; and Biaxin [clarithromycin]  Chief Complaint  Patient presents with  . Acute Visit    Acute    HPI: Patient is 83 y.o. female who I am seeing because her RP, her daughter has concern for her hair falling out and for a tremor that she has that she says is new. Per nursing tremor is not interfering with activities. Pt can not fully participate in ROS..   Past Medical History:  Diagnosis Date  . Anxiety   . Arterial tortuosity (aorta) 12/11/2012  . Asthma   . Chronic kidney disease, stage IV (severe) ([place]) 12/11/2012  . Chronic mental illness   . Colitis   . COPD (chronic obstructive pulmonary disease) ([place])   . Depression   . Dysphagia 01/11/2015  . GERD (gastroesophageal reflux disease)   . Insomnia, unspecified   . Migraines   . Nasal bone fracture 03/30/2014  . Osteoporosis   . Schatzki's ring 01/14/2015  . Scoliosis   . Substance abuse    Benbzos and Narcotics, she does not get any of those medicines from [facility], we have empathetically told her that.  . Vertigo   . Vitamin B deficiency   . Weight loss     Past  Surgical History:  Procedure Laterality Date  . ABDOMINAL HYSTERECTOMY    . CATARACT EXTRACTION    . CHOLECYSTECTOMY    . ESOPHAGOGASTRODUODENOSCOPY (EGD) WITH PROPOFOL N/A 01/13/2015   Procedure: ESOPHAGOGASTRODUODENOSCOPY (EGD) WITH PROPOFOL;  Surgeon: [name], MD;  Location: [facility];  Service: Endoscopy;  Laterality: N/A;  . HIP ARTHROPLASTY Right 12/21/2015   Procedure: ARTHROPLASTY BIPOLAR HIP (HEMIARTHROPLASTY);  Surgeon: [name], MD;  Location: [facility];  Service: Orthopedics;  Laterality: Right;  . PARTIAL HYSTERECTOMY      Allergies as of 03/16/2016      Reactions   Macrobid [nitrofurantoin Macrocrystal] Rash   Azithromycin Other (See Comments)   unknown   Doxycycline Other (See Comments)   unknown   Escitalopram Oxalate Other (See Comments)   unknown   Fioricet [butalbital-apap-caffeine] Other (See Comments)   Drunk.   Latex Other (See Comments)   unknown   Levofloxacin Other (See Comments)   unknown   Nsaids Other (See Comments)   This is not an allergy. The patient was told by Dr. Rockne Menghini to avoid NSAIDs and stop Vicoprofen in to 2014 to avoid nephrotoxicity.  Allergic reaction not listed on MAR.   Oxycodone Other (See Comments)   reaction to synthetic codeine   Restasis [cyclosporine] Other (See Comments)   unknown   Sulfa Antibiotics Other (See Comments)   Reaction unknown   Biaxin [clarithromycin] Rash   With burning sensation      Medication List       Accurate as of 03/16/16  1:57 PM. Always use your  most recent med list.          acetaminophen 325 MG tablet Commonly known as:  TYLENOL Take 650 mg by mouth every 6 (six) hours as needed for headache.   albuterol 108 (90 Base) MCG/ACT inhaler Commonly known as:  PROVENTIL HFA;VENTOLIN HFA Inhale 1 puff into the lungs every 6 (six) hours as needed for shortness of breath.   ASPERCREME 10 % Lotn Generic drug:  Trolamine Salicylate Apply 1 application topically 2 (two) times daily as  needed (for painful areas).   Cranberry 425 MG Caps Take 425 mg by mouth 2 (two) times daily.   divalproex 125 MG capsule Commonly known as:  DEPAKOTE SPRINKLE Take 250 mg by mouth 2 (two) times daily.   docusate sodium 100 MG capsule Commonly known as:  COLACE Take 100 mg by mouth 2 (two) times daily.   dorzolamide-timolol 22.3-6.8 MG/ML ophthalmic solution Commonly known as:  COSOPT Place 1 drop into both eyes 2 (two) times daily.   feeding supplement (PRO-STAT SUGAR FREE 64) Liqd Take 30 mLs by mouth 2 (two) times daily.   feeding supplement Liqd Take 1 Container by mouth 2 (two) times daily between meals.   NUTRITIONAL SUPPLEMENT Liqd Initiate 8 oz of Med Pass by mouth 2 times daily to support weight status   fludrocortisone 0.1 MG tablet Commonly known as:  FLORINEF Take 0.1 mg by mouth every morning.   HYDROcodone-acetaminophen 5-325 MG tablet Commonly known as:  NORCO/VICODIN Take 1 tablet by mouth 2 (two) times daily. Scheduled at 9 am and 5 pm for pain   hydrOXYzine 25 MG capsule Commonly known as:  VISTARIL Take 25 mg by mouth every 6 (six) hours as needed for itching.   lactulose 10 GM/15ML solution Commonly known as:  CHRONULAC Take 15 mLs (10 g total) by mouth daily as needed for severe constipation.   lidocaine 5 % Commonly known as:  LIDODERM Place 1 patch onto the skin every 12 (twelve) hours as needed (for pain). Remove & Discard patch within 12 hours or as directed by MD   metoprolol tartrate 25 MG tablet Commonly known as:  LOPRESSOR Take 25 mg by mouth 2 (two) times daily.   nitroGLYCERIN 0.4 MG SL tablet Commonly known as:  NITROSTAT Place 0.4 mg under the tongue every 5 (five) minutes as needed for chest pain. May use 3 times   pantoprazole 40 MG tablet Commonly known as:  PROTONIX Take 1 tablet (40 mg total) by mouth daily at 6 (six) AM.   polyethylene glycol packet Commonly known as:  MIRALAX / GLYCOLAX Take 17 g by mouth daily.     QUEtiapine Fumarate 150 MG 24 hr tablet Commonly known as:  SEROQUEL XR Take 150 mg by mouth daily.   QUEtiapine 100 MG tablet Commonly known as:  SEROQUEL Take 100 mg by mouth daily.   ranitidine 150 MG tablet Commonly known as:  ZANTAC Take 150 mg by mouth 2 (two) times daily as needed for heartburn.   senna 8.6 MG tablet Commonly known as:  SENOKOT Take 2 tablets by mouth 2 (two) times daily as needed for constipation.   travoprost (benzalkonium) 0.004 % ophthalmic solution Commonly known as:  TRAVATAN Place 1 drop into both eyes at bedtime.   Vitamin D 1000 units capsule Take 1,000 Units by mouth daily with breakfast.       Meds ordered this encounter  Medications  . HYDROcodone-acetaminophen (NORCO/VICODIN) 5-325 MG tablet    Sig: Take 1 tablet  by mouth 2 (two) times daily. Scheduled at 9 am and 5 pm for pain  . hydrOXYzine (VISTARIL) 25 MG capsule    Sig: Take 25 mg by mouth every 6 (six) hours as needed for itching.  Marland Kitchen QUEtiapine (SEROQUEL) 100 MG tablet    Sig: Take 100 mg by mouth daily.  . QUEtiapine Fumarate (SEROQUEL XR) 150 MG 24 hr tablet    Sig: Take 150 mg by mouth daily.  . divalproex (DEPAKOTE SPRINKLE) 125 MG capsule    Sig: Take 250 mg by mouth 2 (two) times daily.  . Amino Acids-Protein Hydrolys (FEEDING SUPPLEMENT, PRO-STAT SUGAR FREE 64,) LIQD    Sig: Take 30 mLs by mouth 2 (two) times daily.    Immunization History  Administered Date(s) Administered  . Influenza Split 11/13/2011  . Influenza-Unspecified 02/13/2008, 11/30/2013  . PPD Test 12/15/2015  . Pneumococcal-Unspecified 02/13/2008  . Td 02/12/2009    Social History  Substance Use Topics  . Smoking status: Former Smoker    Packs/day: 0.50    Types: Cigarettes  . Smokeless tobacco: Never Used  . Alcohol use No    Review of Systems  DATA OBTAINED: from patient, nurse, reports from daughter GENERAL:  no fevers, fatigue, appetite changes, hair loss SKIN: No itching,  rash HEENT: No complaint RESPIRATORY: No cough, wheezing, SOB CARDIAC: No chest pain, palpitations, lower extremity edema  GI: No abdominal pain, No N/V/D or constipation, No heartburn or reflux  GU: No dysuria, frequency or urgency, or incontinence  MUSCULOSKELETAL: No unrelieved bone/joint pain NEUROLOGIC: No headache, dizziness ; hand tremor PSYCHIATRIC: No overt anxiety or sadness  Vitals:   03/16/16 1323  BP: (!) 162/89  Pulse: 68  Resp: 20  Temp: 97 F (36.1 C)   Body mass index is 17.61 kg/m. Physical Exam  GENERAL APPEARANCE: Alert,min  conversant, No acute distress  SKIN: No diaphoresis rash HEENT: Unremarkable RESPIRATORY: Breathing is even, unlabored. Lung sounds are clear   CARDIOVASCULAR: Heart RRR no murmurs, rubs or gallops. No peripheral edema  GASTROINTESTINAL: Abdomen is soft, non-tender, not distended w/ normal bowel sounds.  GENITOURINARY: Bladder non tender, not distended  MUSCULOSKELETAL: No abnormal joints or musculature NEUROLOGIC: Cranial nerves 2-12 grossly intact. Moves all extremities; has a fine intention tremor PSYCHIATRIC: Mood and affect with dementia, no behavioral issues  Patient Active Problem List   Diagnosis Date Noted  . S/P ORIF (open reduction internal fixation) fracture 01/08/2016  . Oral thrush 01/08/2016  . HTN (hypertension) 01/08/2016  . Closed hip fracture requiring operative repair, right, with routine healing, subsequent encounter 12/19/2015  . Bipolar disease, chronic ([place]) 12/17/2015  . Decubitus ulcer of hip, stage 3 ([place]) 12/11/2015  . Ankle pain   . Encephalopathy   . Malnutrition of moderate degree 01/29/2015  . Altered mental status 01/26/2015  . Nausea and vomiting 01/17/2015  . Esophageal dysmotilities   . Urinary tract infectious disease   . Weakness 01/14/2015  . Schatzki's ring 01/14/2015  . CKD (chronic kidney disease), stage III 01/14/2015  . Esophagus disorder   . Dysphagia 01/11/2015  . UTI (lower  urinary tract infection) 03/30/2014  . Hearing loss, sensorineural 03/04/2014  . Nasal lesion 03/04/2014  . Unspecified constipation 12/12/2012  . Hypokalemia 12/11/2012  . Orthostatic hypotension 12/11/2012  . Arterial tortuosity (aorta) 12/11/2012  . Protein-calorie malnutrition, severe ([place]) 12/10/2012  . Decreased rectal sphincter tone 11/21/2012  . Idiopathic scoliosis 07/08/2012  . Macular degeneration 06/26/2011  . Vision disturbance 06/26/2011  . Glaucoma 06/26/2011  .  Anemia of chronic disease 04/17/2011  . Drug-seeking behavior 02/26/2011  . Osteoporosis 02/26/2011  . Depression   . Anxiety   . COPD (chronic obstructive pulmonary disease) ([place])   . GERD (gastroesophageal reflux disease)   . Migraines   . Insomnia, unspecified     CMP     Component Value Date/Time   NA 139 12/29/2015 0515   NA 139 12/29/2015   K 3.8 12/29/2015 0515   CL 107 12/29/2015 0515   CO2 21 (L) 12/29/2015 0515   GLUCOSE 114 (H) 12/29/2015 0515   BUN 10 12/29/2015 0515   BUN 10 12/29/2015   CREATININE 0.55 12/29/2015 0515   CREATININE 0.85 11/09/2013 1135   CALCIUM 8.5 (L) 12/29/2015 0515   PROT 6.4 (L) 12/19/2015 1717   ALBUMIN 2.9 (L) 12/19/2015 1717   AST 61 (H) 12/19/2015 1717   ALT 46 12/19/2015 1717   ALKPHOS 52 12/19/2015 1717   BILITOT 0.6 12/19/2015 1717   GFRNONAA >60 12/29/2015 0515   GFRNONAA 64 11/09/2013 1135   GFRAA >60 12/29/2015 0515   GFRAA 74 11/09/2013 1135    Recent Labs  11/23/15 1631  12/15/15 0344  12/27/15 0346 12/28/15 12/28/15 0431 12/29/15 12/29/15 0515  NA 144  < > 142  < > 142 139 139 139 139  K 4.6  < > 3.9  < > 3.5 3.2* 3.2*  --  3.8  CL 110  < > 111  < > 110  --  106  --  107  CO2 27  < > 23  < > 23  --  23  --  21*  GLUCOSE 126*  < > 95  < > 106*  --  110*  --  114*  BUN 28*  < > 15  < > 6 12 12 10 10   CREATININE 0.93  < > 0.74  < > 0.50 0.6 0.60 0.6 0.55  CALCIUM 9.0  < > 8.9  < > 8.4*  --  8.3*  --  8.5*  MG 2.1  --  1.5*  --   --    --   --   --   --   < > = values in this interval not displayed.  Recent Labs  12/12/15 0442 12/13/15 0439 12/19/15 1717  AST 20 21 61*  ALT 9* 10* 46  ALKPHOS 58 61 52  BILITOT 0.6 0.9 0.6  PROT 5.3* 5.5* 6.4*  ALBUMIN 2.5* 2.7* 2.9*    Recent Labs  12/13/15 12/13/15 0439  12/19/15 1717  12/25/15 0358 12/26/15 12/26/15 0416 12/27/15 12/27/15 0346  WBC 9.0 9.0  < > 7.8  < > 8.2 5.7 5.7 5.7 5.7  NEUTROABS 7 6.9  --  5.4  --   --   --   --   --   --   HGB 10.8* 10.8*  < > 10.9*  < > 8.4* 8.3* 8.3*  --  8.4*  HCT 34* 33.6*  < > 35.2*  < > 27.3* 26* 25.9*  --  27.1*  MCV  --  93.3  < > 93.9  < > 91.0  --  89.0  --  88.6  PLT 215 215  < > 285  < > 331 357 357  --  406*  < > = values in this interval not displayed. No results for input(s): CHOL, LDLCALC, TRIG in the last 8760 hours.  Invalid input(s): HCL No results found for: [facility] Lab Results  Component Value Date  TSH 0.509 06/26/2011   No results found for: HGBA1C No results found for: CHOL, HDL, LDLCALC, LDLDIRECT, TRIG, CHOLHDL  Significant Diagnostic Results in last 30 days:  No results found.  Assessment and Plan  ALOPETIA- have written to start patient on biotin 5000 mg daily  INTENTION TREMOR - do not feel is is severe enough to warrent medications; will monitor    Time spent > 25 min;> 50% of time with patient was spent reviewing records, labs, tests and studies, counseling and developing plan of care  Webb Silversmith D. [name], MD

## 2016-03-17 DIAGNOSIS — S72091D Other fracture of head and neck of right femur, subsequent encounter for closed fracture with routine healing: Secondary | ICD-10-CM | POA: Diagnosis not present

## 2016-03-17 DIAGNOSIS — M6281 Muscle weakness (generalized): Secondary | ICD-10-CM | POA: Diagnosis not present

## 2016-03-17 DIAGNOSIS — Z96641 Presence of right artificial hip joint: Secondary | ICD-10-CM | POA: Diagnosis not present

## 2016-03-17 DIAGNOSIS — R2681 Unsteadiness on feet: Secondary | ICD-10-CM | POA: Diagnosis not present

## 2016-03-18 ENCOUNTER — Encounter: Payer: Self-pay | Admitting: Internal Medicine

## 2016-03-19 DIAGNOSIS — S72091D Other fracture of head and neck of right femur, subsequent encounter for closed fracture with routine healing: Secondary | ICD-10-CM | POA: Diagnosis not present

## 2016-03-19 DIAGNOSIS — R2681 Unsteadiness on feet: Secondary | ICD-10-CM | POA: Diagnosis not present

## 2016-03-19 DIAGNOSIS — M6281 Muscle weakness (generalized): Secondary | ICD-10-CM | POA: Diagnosis not present

## 2016-03-19 DIAGNOSIS — Z96641 Presence of right artificial hip joint: Secondary | ICD-10-CM | POA: Diagnosis not present

## 2016-03-20 DIAGNOSIS — S72091D Other fracture of head and neck of right femur, subsequent encounter for closed fracture with routine healing: Secondary | ICD-10-CM | POA: Diagnosis not present

## 2016-03-20 DIAGNOSIS — M6281 Muscle weakness (generalized): Secondary | ICD-10-CM | POA: Diagnosis not present

## 2016-03-20 DIAGNOSIS — Z96641 Presence of right artificial hip joint: Secondary | ICD-10-CM | POA: Diagnosis not present

## 2016-03-20 DIAGNOSIS — R2681 Unsteadiness on feet: Secondary | ICD-10-CM | POA: Diagnosis not present

## 2016-03-21 DIAGNOSIS — S72091D Other fracture of head and neck of right femur, subsequent encounter for closed fracture with routine healing: Secondary | ICD-10-CM | POA: Diagnosis not present

## 2016-03-21 DIAGNOSIS — M6281 Muscle weakness (generalized): Secondary | ICD-10-CM | POA: Diagnosis not present

## 2016-03-21 DIAGNOSIS — Z96641 Presence of right artificial hip joint: Secondary | ICD-10-CM | POA: Diagnosis not present

## 2016-03-21 DIAGNOSIS — R2681 Unsteadiness on feet: Secondary | ICD-10-CM | POA: Diagnosis not present

## 2016-03-22 DIAGNOSIS — M6281 Muscle weakness (generalized): Secondary | ICD-10-CM | POA: Diagnosis not present

## 2016-03-22 DIAGNOSIS — Z96641 Presence of right artificial hip joint: Secondary | ICD-10-CM | POA: Diagnosis not present

## 2016-03-22 DIAGNOSIS — S72091D Other fracture of head and neck of right femur, subsequent encounter for closed fracture with routine healing: Secondary | ICD-10-CM | POA: Diagnosis not present

## 2016-03-22 DIAGNOSIS — R2681 Unsteadiness on feet: Secondary | ICD-10-CM | POA: Diagnosis not present

## 2016-03-23 DIAGNOSIS — S72091D Other fracture of head and neck of right femur, subsequent encounter for closed fracture with routine healing: Secondary | ICD-10-CM | POA: Diagnosis not present

## 2016-03-23 DIAGNOSIS — M6281 Muscle weakness (generalized): Secondary | ICD-10-CM | POA: Diagnosis not present

## 2016-03-23 DIAGNOSIS — Z96641 Presence of right artificial hip joint: Secondary | ICD-10-CM | POA: Diagnosis not present

## 2016-03-23 DIAGNOSIS — R2681 Unsteadiness on feet: Secondary | ICD-10-CM | POA: Diagnosis not present

## 2016-03-24 DIAGNOSIS — S72091D Other fracture of head and neck of right femur, subsequent encounter for closed fracture with routine healing: Secondary | ICD-10-CM | POA: Diagnosis not present

## 2016-03-24 DIAGNOSIS — Z96641 Presence of right artificial hip joint: Secondary | ICD-10-CM | POA: Diagnosis not present

## 2016-03-24 DIAGNOSIS — M6281 Muscle weakness (generalized): Secondary | ICD-10-CM | POA: Diagnosis not present

## 2016-03-24 DIAGNOSIS — R2681 Unsteadiness on feet: Secondary | ICD-10-CM | POA: Diagnosis not present

## 2016-03-25 ENCOUNTER — Encounter: Payer: Self-pay | Admitting: Internal Medicine

## 2016-03-25 DIAGNOSIS — F03C0 Unspecified dementia, severe, without behavioral disturbance, psychotic disturbance, mood disturbance, and anxiety: Secondary | ICD-10-CM | POA: Insufficient documentation

## 2016-03-25 DIAGNOSIS — F0391 Unspecified dementia with behavioral disturbance: Secondary | ICD-10-CM | POA: Insufficient documentation

## 2016-03-25 NOTE — Assessment & Plan Note (Signed)
There is a flu outbreak in [place]

## 2016-03-25 NOTE — Assessment & Plan Note (Signed)
Pt's  recent K+ is 3.8 without daily supplement;pt is on fliorinef 0.1 mg daily; will monitor at intervals

## 2016-03-25 NOTE — Assessment & Plan Note (Signed)
Not well controled on metoprolol 25 mg BID; will add norvasc 10 mg daily

## 2016-03-25 NOTE — Assessment & Plan Note (Signed)
Appears controlled on seroquel 150 mg daily;plan to cont current med.

## 2016-03-26 DIAGNOSIS — S72091D Other fracture of head and neck of right femur, subsequent encounter for closed fracture with routine healing: Secondary | ICD-10-CM | POA: Diagnosis not present

## 2016-03-26 DIAGNOSIS — M6281 Muscle weakness (generalized): Secondary | ICD-10-CM | POA: Diagnosis not present

## 2016-03-26 DIAGNOSIS — R2681 Unsteadiness on feet: Secondary | ICD-10-CM | POA: Diagnosis not present

## 2016-03-26 DIAGNOSIS — Z96641 Presence of right artificial hip joint: Secondary | ICD-10-CM | POA: Diagnosis not present

## 2016-03-27 DIAGNOSIS — S72091D Other fracture of head and neck of right femur, subsequent encounter for closed fracture with routine healing: Secondary | ICD-10-CM | POA: Diagnosis not present

## 2016-03-27 DIAGNOSIS — M6281 Muscle weakness (generalized): Secondary | ICD-10-CM | POA: Diagnosis not present

## 2016-03-27 DIAGNOSIS — R2681 Unsteadiness on feet: Secondary | ICD-10-CM | POA: Diagnosis not present

## 2016-03-27 DIAGNOSIS — Z96641 Presence of right artificial hip joint: Secondary | ICD-10-CM | POA: Diagnosis not present

## 2016-03-28 DIAGNOSIS — Z96641 Presence of right artificial hip joint: Secondary | ICD-10-CM | POA: Diagnosis not present

## 2016-03-28 DIAGNOSIS — S72091D Other fracture of head and neck of right femur, subsequent encounter for closed fracture with routine healing: Secondary | ICD-10-CM | POA: Diagnosis not present

## 2016-03-28 DIAGNOSIS — R2681 Unsteadiness on feet: Secondary | ICD-10-CM | POA: Diagnosis not present

## 2016-03-28 DIAGNOSIS — M6281 Muscle weakness (generalized): Secondary | ICD-10-CM | POA: Diagnosis not present

## 2016-03-29 ENCOUNTER — Encounter: Payer: Self-pay | Admitting: Internal Medicine

## 2016-03-29 ENCOUNTER — Non-Acute Institutional Stay (SKILLED_NURSING_FACILITY): Payer: Medicare Other | Admitting: Internal Medicine

## 2016-03-29 DIAGNOSIS — Z96641 Presence of right artificial hip joint: Secondary | ICD-10-CM | POA: Diagnosis not present

## 2016-03-29 DIAGNOSIS — R2681 Unsteadiness on feet: Secondary | ICD-10-CM | POA: Diagnosis not present

## 2016-03-29 DIAGNOSIS — M6281 Muscle weakness (generalized): Secondary | ICD-10-CM | POA: Diagnosis not present

## 2016-03-29 DIAGNOSIS — F1920 Other psychoactive substance dependence, uncomplicated: Secondary | ICD-10-CM | POA: Diagnosis not present

## 2016-03-29 DIAGNOSIS — S72091D Other fracture of head and neck of right femur, subsequent encounter for closed fracture with routine healing: Secondary | ICD-10-CM | POA: Diagnosis not present

## 2016-03-29 DIAGNOSIS — Z471 Aftercare following joint replacement surgery: Secondary | ICD-10-CM | POA: Diagnosis not present

## 2016-03-29 NOTE — Progress Notes (Signed)
Location:  [place] Room Number: 989Q Place of Service:  SNF [phone])  [name], MD  Patient Care Team: Pcp Not In System as PCP - General [name], MD (Gastroenterology) [name], NP (Obstetrics and Gynecology)  Extended Emergency Contact Information Primary Emergency Contact: [name],[name] Address: [place], [place] Phone: [phone] Mobile Phone: [phone] Relation: Daughter Secondary Emergency Contact: [name] Phone: [phone] Mobile Phone: [phone] Relation: Daughter    Allergies: Macrobid [nitrofurantoin macrocrystal]; Azithromycin; Doxycycline; Escitalopram oxalate; Fioricet [butalbital-apap-caffeine]; Latex; Levofloxacin; Nsaids; Oxycodone; Restasis [cyclosporine]; Sulfa antibiotics; and Biaxin [clarithromycin]  Chief Complaint  Patient presents with  . Acute Visit    Acute    HPI: Patient is 83 y.o. female who is being seen because her daughter thinks she needs a third dose of pain medication. The daughter is known to want Mom out of bed and pt is known to want to be in bed and when in bed has no c/o. Pt does not leave her room or chair when she is out of bed. Pt cannot full participate in HPI or ROS.  Past Medical History:  Diagnosis Date  . Anxiety   . Arterial tortuosity (aorta) 12/11/2012  . Asthma   . Chronic kidney disease, stage IV (severe) ([place]) 12/11/2012  . Chronic mental illness   . Colitis   . COPD (chronic obstructive pulmonary disease) ([place])   . Depression   . Dysphagia 01/11/2015  . GERD (gastroesophageal reflux disease)   . Insomnia, unspecified   . Migraines   . Nasal bone fracture 03/30/2014  . Osteoporosis   . Schatzki's ring 01/14/2015  . Scoliosis   . Substance abuse    Benbzos and Narcotics, she does not get any of those medicines from [facility], we have empathetically told her  that.  . Vertigo   . Vitamin B deficiency   . Weight loss     Past Surgical History:  Procedure Laterality Date  . ABDOMINAL HYSTERECTOMY    . CATARACT EXTRACTION    . CHOLECYSTECTOMY    . ESOPHAGOGASTRODUODENOSCOPY (EGD) WITH PROPOFOL N/A 01/13/2015   Procedure: ESOPHAGOGASTRODUODENOSCOPY (EGD) WITH PROPOFOL;  Surgeon: [name], MD;  [facility];  Service: Endoscopy;  Laterality: N/A;  . HIP ARTHROPLASTY Right 12/21/2015   Procedure: ARTHROPLASTY BIPOLAR HIP (HEMIARTHROPLASTY);  Surgeon: [name], MD;  Location: [place];  Service: Orthopedics;  Laterality: Right;  . PARTIAL HYSTERECTOMY      Allergies as of 03/29/2016      Reactions   Macrobid [nitrofurantoin Macrocrystal] Rash   Azithromycin Other (See Comments)   unknown   Doxycycline Other (See Comments)   unknown   Escitalopram Oxalate Other (See Comments)   unknown   Fioricet [butalbital-apap-caffeine] Other (See Comments)   Drunk.   Latex Other (See Comments)   unknown   Levofloxacin Other (See Comments)   unknown   Nsaids Other (See Comments)   This is not an allergy. The patient was told by Dr. Rockne Menghini to avoid NSAIDs and stop Vicoprofen in to 2014 to avoid nephrotoxicity.  Allergic reaction not listed on MAR.   Oxycodone Other (See Comments)   reaction to synthetic codeine   Restasis [cyclosporine] Other (See Comments)   unknown   Sulfa Antibiotics Other (See Comments)   Reaction unknown   Biaxin [clarithromycin] Rash   With burning sensation  Medication List       Accurate as of 03/29/16 11:59 PM. Always use your most recent med list.          acetaminophen 325 MG tablet Commonly known as:  TYLENOL Take 650 mg by mouth every 6 (six) hours as needed for headache.   albuterol 108 (90 Base) MCG/ACT inhaler Commonly known as:  PROVENTIL HFA;VENTOLIN HFA Inhale 1 puff into the lungs every 6 (six) hours as needed for shortness of breath.   ASPERCREME 10 % Lotn Generic drug:   Trolamine Salicylate Apply 1 application topically 2 (two) times daily as needed (for painful areas).   Cranberry 425 MG Caps Take 425 mg by mouth 2 (two) times daily.   docusate sodium 100 MG capsule Commonly known as:  COLACE Take 100 mg by mouth 2 (two) times daily.   dorzolamide-timolol 22.3-6.8 MG/ML ophthalmic solution Commonly known as:  COSOPT Place 1 drop into both eyes 2 (two) times daily.   feeding supplement (PRO-STAT SUGAR FREE 64) Liqd Take 30 mLs by mouth 2 (two) times daily.   feeding supplement Liqd Take 1 Container by mouth 2 (two) times daily between meals.   NUTRITIONAL SUPPLEMENT Liqd Initiate 8 oz of Med Pass by mouth 2 times daily to support weight status   fludrocortisone 0.1 MG tablet Commonly known as:  FLORINEF Take 0.1 mg by mouth every morning.   HYDROcodone-acetaminophen 5-325 MG tablet Commonly known as:  NORCO/VICODIN Take 1 tablet by mouth 2 (two) times daily. Scheduled at 9 am and 5 pm for pain   hydrOXYzine 25 MG capsule Commonly known as:  VISTARIL Take 25 mg by mouth every 6 (six) hours as needed for itching.   lactulose 10 GM/15ML solution Commonly known as:  CHRONULAC Take 15 mLs (10 g total) by mouth daily as needed for severe constipation.   lidocaine 5 % Commonly known as:  LIDODERM Place 1 patch onto the skin every 12 (twelve) hours as needed (for pain). Remove & Discard patch within 12 hours or as directed by MD   metoprolol tartrate 25 MG tablet Commonly known as:  LOPRESSOR Take 25 mg by mouth 2 (two) times daily.   nitroGLYCERIN 0.4 MG SL tablet Commonly known as:  NITROSTAT Place 0.4 mg under the tongue every 5 (five) minutes as needed for chest pain. May use 3 times   pantoprazole 40 MG tablet Commonly known as:  PROTONIX Take 1 tablet (40 mg total) by mouth daily at 6 (six) AM.   polyethylene glycol packet Commonly known as:  MIRALAX / GLYCOLAX Take 17 g by mouth daily.   QUEtiapine Fumarate 150 MG 24 hr  tablet Commonly known as:  SEROQUEL XR Take 150 mg by mouth daily.   ranitidine 150 MG tablet Commonly known as:  ZANTAC Take 150 mg by mouth 2 (two) times daily as needed for heartburn.   senna 8.6 MG tablet Commonly known as:  SENOKOT Take 2 tablets by mouth 2 (two) times daily as needed for constipation.   travoprost (benzalkonium) 0.004 % ophthalmic solution Commonly known as:  TRAVATAN Place 1 drop into both eyes at bedtime.   Vitamin D 1000 units capsule Take 1,000 Units by mouth daily with breakfast.       No orders of the defined types were placed in this encounter.   Immunization History  Administered Date(s) Administered  . Influenza Split 11/13/2011  . Influenza-Unspecified 02/13/2008, 11/30/2013  . PPD Test 12/15/2015  . Pneumococcal-Unspecified 02/13/2008  . Td 02/12/2009  Social History  Substance Use Topics  . Smoking status: Former Smoker    Packs/day: 0.50    Types: Cigarettes  . Smokeless tobacco: Never Used  . Alcohol use No    Review of Systems  UTO 2/2 dementia; nursing does not feel pt appears in pain    Vitals:   03/29/16 1543  BP: (!) 162/89  Pulse: 60  Resp: 18  Temp: 98.4 F (36.9 C)   Body mass index is 18.74 kg/m. Physical Exam  GENERAL APPEARANCE: Alert, min conversant, No acute distress  SKIN: No diaphoresis rash HEENT: Unremarkable RESPIRATORY: Breathing is even, unlabored. Lung sounds are clear   CARDIOVASCULAR: Heart RRR no murmurs, rubs or gallops. No peripheral edema  GASTROINTESTINAL: Abdomen is soft, non-tender, not distended w/ normal bowel sounds.  GENITOURINARY: Bladder non tender, not distended  MUSCULOSKELETAL: No abnormal joints or musculature NEUROLOGIC: Cranial nerves 2-12 grossly intact. Moves all extremities PSYCHIATRIC: Mood and affect odd, with dementia, no behavioral issues  Patient Active Problem List   Diagnosis Date Noted  . Dementia without behavioral disturbance 03/25/2016  . S/P ORIF  (open reduction internal fixation) fracture 01/08/2016  . Oral thrush 01/08/2016  . HTN (hypertension) 01/08/2016  . Closed hip fracture requiring operative repair, right, with routine healing, subsequent encounter 12/19/2015  . Bipolar disease, chronic ([place]) 12/17/2015  . Decubitus ulcer of hip, stage 3 ([place]) 12/11/2015  . Ankle pain   . Encephalopathy   . Malnutrition of moderate degree 01/29/2015  . Altered mental status 01/26/2015  . Nausea and vomiting 01/17/2015  . Esophageal dysmotilities   . Urinary tract infectious disease   . Weakness 01/14/2015  . Schatzki's ring 01/14/2015  . CKD (chronic kidney disease), stage III 01/14/2015  . Esophagus disorder   . Dysphagia 01/11/2015  . UTI (lower urinary tract infection) 03/30/2014  . Hearing loss, sensorineural 03/04/2014  . Nasal lesion 03/04/2014  . Unspecified constipation 12/12/2012  . Hypokalemia 12/11/2012  . Orthostatic hypotension 12/11/2012  . Arterial tortuosity (aorta) 12/11/2012  . Protein-calorie malnutrition, severe ([place]) 12/10/2012  . Decreased rectal sphincter tone 11/21/2012  . Idiopathic scoliosis 07/08/2012  . Macular degeneration 06/26/2011  . Vision disturbance 06/26/2011  . Glaucoma 06/26/2011  . Anemia of chronic disease 04/17/2011  . Drug-seeking behavior 02/26/2011  . Osteoporosis 02/26/2011  . Depression   . Anxiety   . COPD (chronic obstructive pulmonary disease) ([place])   . GERD (gastroesophageal reflux disease)   . Migraines   . Insomnia, unspecified     CMP     Component Value Date/Time   NA 139 12/29/2015 0515   NA 139 12/29/2015   K 3.8 12/29/2015 0515   CL 107 12/29/2015 0515   CO2 21 (L) 12/29/2015 0515   GLUCOSE 114 (H) 12/29/2015 0515   BUN 10 12/29/2015 0515   BUN 10 12/29/2015   CREATININE 0.55 12/29/2015 0515   CREATININE 0.85 11/09/2013 1135   CALCIUM 8.5 (L) 12/29/2015 0515   PROT 6.4 (L) 12/19/2015 1717   ALBUMIN 2.9 (L) 12/19/2015 1717   AST 61 (H) 12/19/2015 1717    ALT 46 12/19/2015 1717   ALKPHOS 52 12/19/2015 1717   BILITOT 0.6 12/19/2015 1717   GFRNONAA >60 12/29/2015 0515   GFRNONAA 64 11/09/2013 1135   GFRAA >60 12/29/2015 0515   GFRAA 74 11/09/2013 1135    Recent Labs  11/23/15 1631  12/15/15 0344  12/27/15 0346 12/28/15 12/28/15 0431 12/29/15 12/29/15 0515  NA 144  < > 142  < > 142 139  139 139 139  K 4.6  < > 3.9  < > 3.5 3.2* 3.2*  --  3.8  CL 110  < > 111  < > 110  --  106  --  107  CO2 27  < > 23  < > 23  --  23  --  21*  GLUCOSE 126*  < > 95  < > 106*  --  110*  --  114*  BUN 28*  < > 15  < > 6 12 12 10 10   CREATININE 0.93  < > 0.74  < > 0.50 0.6 0.60 0.6 0.55  CALCIUM 9.0  < > 8.9  < > 8.4*  --  8.3*  --  8.5*  MG 2.1  --  1.5*  --   --   --   --   --   --   < > = values in this interval not displayed.  Recent Labs  12/12/15 0442 12/13/15 0439 12/19/15 1717  AST 20 21 61*  ALT 9* 10* 46  ALKPHOS 58 61 52  BILITOT 0.6 0.9 0.6  PROT 5.3* 5.5* 6.4*  ALBUMIN 2.5* 2.7* 2.9*    Recent Labs  12/13/15 12/13/15 0439  12/19/15 1717  12/25/15 0358 12/26/15 12/26/15 0416 12/27/15 12/27/15 0346  WBC 9.0 9.0  < > 7.8  < > 8.2 5.7 5.7 5.7 5.7  NEUTROABS 7 6.9  --  5.4  --   --   --   --   --   --   HGB 10.8* 10.8*  < > 10.9*  < > 8.4* 8.3* 8.3*  --  8.4*  HCT 34* 33.6*  < > 35.2*  < > 27.3* 26* 25.9*  --  27.1*  MCV  --  93.3  < > 93.9  < > 91.0  --  89.0  --  88.6  PLT 215 215  < > 285  < > 331 357 357  --  406*  < > = values in this interval not displayed. No results for input(s): CHOL, LDLCALC, TRIG in the last 8760 hours.  Invalid input(s): HCL No results found for: MICROALBUR Lab Results  Component Value Date   TSH 0.509 06/26/2011   No results found for: HGBA1C No results found for: CHOL, HDL, LDLCALC, LDLDIRECT, TRIG, CHOLHDL  Significant Diagnostic Results in last 30 days:  No results found.  Assessment and Plan  DEPENDENCY ON PAIN MEDICATIONS - pt's daughter makes the point that her mother in the past  has been on much much more pain medications than she is now and that she needs more; I disagree. Recently at ortho appt pt c/o knee pain (injurt to hip, not knee) ortho rec norco 10/325 q 6 but did not write an RX for this; pt has already proved she is a fall risk, she has not been using that leg, I see no reason for that level of narcotic; rec inc norco 5/325 TID instead of BID should be more than plenty. I spoke with daughter about my changes   Time spent > 25 min;> 50% of time with patient was spent reviewing records, labs, tests and studies, counseling and developing plan of care  [name], MD

## 2016-03-30 ENCOUNTER — Encounter: Payer: Self-pay | Admitting: Internal Medicine

## 2016-03-30 DIAGNOSIS — M6281 Muscle weakness (generalized): Secondary | ICD-10-CM | POA: Diagnosis not present

## 2016-03-30 DIAGNOSIS — Z96641 Presence of right artificial hip joint: Secondary | ICD-10-CM | POA: Diagnosis not present

## 2016-03-30 DIAGNOSIS — R2681 Unsteadiness on feet: Secondary | ICD-10-CM | POA: Diagnosis not present

## 2016-03-30 DIAGNOSIS — S72091D Other fracture of head and neck of right femur, subsequent encounter for closed fracture with routine healing: Secondary | ICD-10-CM | POA: Diagnosis not present

## 2016-03-31 DIAGNOSIS — S72091D Other fracture of head and neck of right femur, subsequent encounter for closed fracture with routine healing: Secondary | ICD-10-CM | POA: Diagnosis not present

## 2016-03-31 DIAGNOSIS — R2681 Unsteadiness on feet: Secondary | ICD-10-CM | POA: Diagnosis not present

## 2016-03-31 DIAGNOSIS — Z96641 Presence of right artificial hip joint: Secondary | ICD-10-CM | POA: Diagnosis not present

## 2016-03-31 DIAGNOSIS — M6281 Muscle weakness (generalized): Secondary | ICD-10-CM | POA: Diagnosis not present

## 2016-04-01 ENCOUNTER — Encounter: Payer: Self-pay | Admitting: Internal Medicine

## 2016-04-01 DIAGNOSIS — Z96641 Presence of right artificial hip joint: Secondary | ICD-10-CM | POA: Diagnosis not present

## 2016-04-01 DIAGNOSIS — S72091D Other fracture of head and neck of right femur, subsequent encounter for closed fracture with routine healing: Secondary | ICD-10-CM | POA: Diagnosis not present

## 2016-04-01 DIAGNOSIS — M6281 Muscle weakness (generalized): Secondary | ICD-10-CM | POA: Diagnosis not present

## 2016-04-01 DIAGNOSIS — R2681 Unsteadiness on feet: Secondary | ICD-10-CM | POA: Diagnosis not present

## 2016-04-02 DIAGNOSIS — R2681 Unsteadiness on feet: Secondary | ICD-10-CM | POA: Diagnosis not present

## 2016-04-02 DIAGNOSIS — Z96641 Presence of right artificial hip joint: Secondary | ICD-10-CM | POA: Diagnosis not present

## 2016-04-02 DIAGNOSIS — S72091D Other fracture of head and neck of right femur, subsequent encounter for closed fracture with routine healing: Secondary | ICD-10-CM | POA: Diagnosis not present

## 2016-04-02 DIAGNOSIS — M6281 Muscle weakness (generalized): Secondary | ICD-10-CM | POA: Diagnosis not present

## 2016-04-03 DIAGNOSIS — R2681 Unsteadiness on feet: Secondary | ICD-10-CM | POA: Diagnosis not present

## 2016-04-03 DIAGNOSIS — Z96641 Presence of right artificial hip joint: Secondary | ICD-10-CM | POA: Diagnosis not present

## 2016-04-03 DIAGNOSIS — M6281 Muscle weakness (generalized): Secondary | ICD-10-CM | POA: Diagnosis not present

## 2016-04-03 DIAGNOSIS — S72091D Other fracture of head and neck of right femur, subsequent encounter for closed fracture with routine healing: Secondary | ICD-10-CM | POA: Diagnosis not present

## 2016-04-08 ENCOUNTER — Encounter: Payer: Self-pay | Admitting: Internal Medicine

## 2016-04-09 ENCOUNTER — Encounter: Payer: Self-pay | Admitting: Internal Medicine

## 2016-04-09 ENCOUNTER — Non-Acute Institutional Stay (SKILLED_NURSING_FACILITY): Payer: Medicare Other | Admitting: Internal Medicine

## 2016-04-09 DIAGNOSIS — N183 Chronic kidney disease, stage 3 unspecified: Secondary | ICD-10-CM

## 2016-04-09 DIAGNOSIS — D638 Anemia in other chronic diseases classified elsewhere: Secondary | ICD-10-CM

## 2016-04-09 DIAGNOSIS — F419 Anxiety disorder, unspecified: Secondary | ICD-10-CM

## 2016-04-09 NOTE — Progress Notes (Signed)
Location:  [place] Room Number: 202R Place of Service:  SNF [phone])  Taylor Roth. Taylor Coil, MD  Patient Care Team: Pcp Not In System as PCP - General [name], MD (Gastroenterology) [name], NP (Obstetrics and Gynecology)  Extended Emergency Contact Information Primary Emergency Contact: [name],[name] Address: [place], [place] of [place] Phone: [phone] Mobile Phone: [phone] Relation: Daughter Secondary Emergency Contact: [name] of [place] Phone: [phone] Mobile Phone: [phone] Relation: Daughter    Allergies: Macrobid [nitrofurantoin macrocrystal]; Azithromycin; Doxycycline; Escitalopram oxalate; Fioricet [butalbital-apap-caffeine]; Latex; Levofloxacin; Nsaids; Oxycodone; Restasis [cyclosporine]; Sulfa antibiotics; and Biaxin [clarithromycin]  Chief Complaint  Patient presents with  . Medical Management of Chronic Issues    Routine Visit    HPI: Patient is 83 y.o. female who is being seen for routine issues of anemia, anxiety and CKD.  Past Medical History:  Diagnosis Date  . Anxiety   . Arterial tortuosity (aorta) 12/11/2012  . Asthma   . Chronic kidney disease, stage IV (severe) ([place]) 12/11/2012  . Chronic mental illness   . Colitis   . COPD (chronic obstructive pulmonary disease) ([place])   . Depression   . Dysphagia 01/11/2015  . GERD (gastroesophageal reflux disease)   . Insomnia, unspecified   . Migraines   . Nasal bone fracture 03/30/2014  . Osteoporosis   . Schatzki's ring 01/14/2015  . Scoliosis   . Substance abuse    Benbzos and Narcotics, she does not get any of those medicines from [facility], we have empathetically told her that.  . Vertigo   . Vitamin B deficiency   . Weight loss     Past Surgical History:  Procedure Laterality Date  . ABDOMINAL HYSTERECTOMY    . CATARACT EXTRACTION    . CHOLECYSTECTOMY    .  ESOPHAGOGASTRODUODENOSCOPY (EGD) WITH PROPOFOL N/A 01/13/2015   Procedure: ESOPHAGOGASTRODUODENOSCOPY (EGD) WITH PROPOFOL;  Surgeon: [name], MD;  [facility];  Service: Endoscopy;  Laterality: N/A;  . HIP ARTHROPLASTY Right 12/21/2015   Procedure: ARTHROPLASTY BIPOLAR HIP (HEMIARTHROPLASTY);  Surgeon: [name], MD;  Location: [place];  Service: Orthopedics;  Laterality: Right;  . PARTIAL HYSTERECTOMY      Allergies as of 04/09/2016      Reactions   Macrobid [nitrofurantoin Macrocrystal] Rash   Azithromycin Other (See Comments)   unknown   Doxycycline Other (See Comments)   unknown   Escitalopram Oxalate Other (See Comments)   unknown   Fioricet [butalbital-apap-caffeine] Other (See Comments)   Drunk.   Latex Other (See Comments)   unknown   Levofloxacin Other (See Comments)   unknown   Nsaids Other (See Comments)   This is not an allergy. The patient was told by Dr. Rockne Menghini to avoid NSAIDs and stop Vicoprofen in to 2014 to avoid nephrotoxicity.  Allergic reaction not listed on MAR.   Oxycodone Other (See Comments)   reaction to synthetic codeine   Restasis [cyclosporine] Other (See Comments)   unknown   Sulfa Antibiotics Other (See Comments)   Reaction unknown   Biaxin [clarithromycin] Rash   With burning sensation      Medication List       Accurate as of 04/09/16 11:59 PM. Always use your most recent med list.          acetaminophen 325 MG tablet Commonly known as:  TYLENOL Take 650 mg by mouth every 6 (  six) hours as needed for headache.   albuterol 108 (90 Base) MCG/ACT inhaler Commonly known as:  PROVENTIL HFA;VENTOLIN HFA Inhale 1 puff into the lungs every 6 (six) hours as needed for shortness of breath.   ASPERCREME 10 % Lotn Generic drug:  Trolamine Salicylate Apply 1 application topically 2 (two) times daily as needed (for painful areas).   Cranberry 425 MG Caps Take 425 mg by mouth 2 (two) times daily.   docusate sodium 100 MG  capsule Commonly known as:  COLACE Take 100 mg by mouth 2 (two) times daily.   dorzolamide-timolol 22.3-6.8 MG/ML ophthalmic solution Commonly known as:  COSOPT Place 1 drop into both eyes 2 (two) times daily.   feeding supplement (PRO-STAT SUGAR FREE 64) Liqd Take 30 mLs by mouth 2 (two) times daily.   feeding supplement Liqd Take 1 Container by mouth 2 (two) times daily between meals.   NUTRITIONAL SUPPLEMENT Liqd Initiate 8 oz of Med Pass by mouth 2 times daily to support weight status   fludrocortisone 0.1 MG tablet Commonly known as:  FLORINEF Take 0.1 mg by mouth every morning.   HYDROcodone-acetaminophen 5-325 MG tablet Commonly known as:  NORCO/VICODIN Take 1 tablet by mouth 3 (three) times daily. At 9 am , 1 pm , and 9 pm for pain   hydrOXYzine 25 MG capsule Commonly known as:  VISTARIL Take 25 mg by mouth every 6 (six) hours as needed for itching.   lactulose 10 GM/15ML solution Commonly known as:  CHRONULAC Take 15 mLs (10 g total) by mouth daily as needed for severe constipation.   lidocaine 5 % Commonly known as:  LIDODERM Place 1 patch onto the skin every 12 (twelve) hours as needed (for pain). Remove & Discard patch within 12 hours or as directed by MD   metoprolol tartrate 25 MG tablet Commonly known as:  LOPRESSOR Take 25 mg by mouth 2 (two) times daily.   nitroGLYCERIN 0.4 MG SL tablet Commonly known as:  NITROSTAT Place 0.4 mg under the tongue every 5 (five) minutes as needed for chest pain. May use 3 times   pantoprazole 40 MG tablet Commonly known as:  PROTONIX Take 1 tablet (40 mg total) by mouth daily at 6 (six) AM.   polyethylene glycol packet Commonly known as:  MIRALAX / GLYCOLAX Take 17 g by mouth daily.   QUEtiapine Fumarate 150 MG 24 hr tablet Commonly known as:  SEROQUEL XR Take 150 mg by mouth daily.   ranitidine 150 MG tablet Commonly known as:  ZANTAC Take 150 mg by mouth 2 (two) times daily as needed for heartburn.   senna  8.6 MG tablet Commonly known as:  SENOKOT Take 2 tablets by mouth 2 (two) times daily as needed for constipation.   travoprost (benzalkonium) 0.004 % ophthalmic solution Commonly known as:  TRAVATAN Place 1 drop into both eyes at bedtime.   Vitamin D 1000 units capsule Take 1,000 Units by mouth daily with breakfast.       No orders of the defined types were placed in this encounter.   Immunization History  Administered Date(s) Administered  . Influenza Split 11/13/2011  . Influenza-Unspecified 02/13/2008, 11/30/2013  . PPD Test 12/15/2015  . Pneumococcal-Unspecified 02/13/2008  . Td 02/12/2009    Social History  Substance Use Topics  . Smoking status: Former Smoker    Packs/day: 0.50    Types: Cigarettes  . Smokeless tobacco: Never Used  . Alcohol use No    Review of Systems  DATA OBTAINED: from patient, nurse GENERAL:  no fevers, fatigue, appetite changes SKIN: No itching, rash HEENT: No complaint RESPIRATORY: No cough, wheezing, SOB CARDIAC: No chest pain, palpitations, lower extremity edema  GI: No abdominal pain, No N/V/D or constipation, No heartburn or reflux  GU: No dysuria, frequency or urgency, or incontinence  MUSCULOSKELETAL: No unrelieved bone/joint pain NEUROLOGIC: No headache, dizziness  PSYCHIATRIC: No overt anxiety or sadness  Vitals:   04/09/16 1220  BP: (!) 162/89  Pulse: 73  Resp: 16  Temp: 97.5 F (36.4 C)   Body mass index is 18.54 kg/m. Physical Exam  GENERAL APPEARANCE: Alert, mod conversant, No acute distress  SKIN: No diaphoresis rash HEENT: Unremarkable RESPIRATORY: Breathing is even, unlabored. Lung sounds are clear   CARDIOVASCULAR: Heart RRR no murmurs, rubs or gallops. No peripheral edema  GASTROINTESTINAL: Abdomen is soft, non-tender, not distended w/ normal bowel sounds.  GENITOURINARY: Bladder non tender, not distended  MUSCULOSKELETAL: No abnormal joints or musculature NEUROLOGIC: Cranial nerves 2-12 grossly  intact. Moves all extremities PSYCHIATRIC: Mood and affect with dementia, no behavioral issues  Patient Active Problem List   Diagnosis Date Noted  . Dementia without behavioral disturbance 03/25/2016  . S/P ORIF (open reduction internal fixation) fracture 01/08/2016  . Oral thrush 01/08/2016  . HTN (hypertension) 01/08/2016  . Closed hip fracture requiring operative repair, right, with routine healing, subsequent encounter 12/19/2015  . Bipolar disease, chronic ([place]) 12/17/2015  . Decubitus ulcer of hip, stage 3 ([place]) 12/11/2015  . Ankle pain   . Encephalopathy   . Malnutrition of moderate degree 01/29/2015  . Altered mental status 01/26/2015  . Nausea and vomiting 01/17/2015  . Esophageal dysmotilities   . Urinary tract infectious disease   . Weakness 01/14/2015  . Schatzki's ring 01/14/2015  . CKD (chronic kidney disease), stage III 01/14/2015  . Esophagus disorder   . Dysphagia 01/11/2015  . UTI (lower urinary tract infection) 03/30/2014  . Hearing loss, sensorineural 03/04/2014  . Nasal lesion 03/04/2014  . Unspecified constipation 12/12/2012  . Hypokalemia 12/11/2012  . Orthostatic hypotension 12/11/2012  . Arterial tortuosity (aorta) 12/11/2012  . Protein-calorie malnutrition, severe ([place]) 12/10/2012  . Decreased rectal sphincter tone 11/21/2012  . Idiopathic scoliosis 07/08/2012  . Macular degeneration 06/26/2011  . Vision disturbance 06/26/2011  . Glaucoma 06/26/2011  . Anemia of chronic disease 04/17/2011  . Drug-seeking behavior 02/26/2011  . Osteoporosis 02/26/2011  . Depression   . Anxiety   . COPD (chronic obstructive pulmonary disease) ([place])   . GERD (gastroesophageal reflux disease)   . Migraines   . Insomnia, unspecified     CMP     Component Value Date/Time   NA 139 12/29/2015 0515   NA 139 12/29/2015   K 3.8 12/29/2015 0515   CL 107 12/29/2015 0515   CO2 21 (L) 12/29/2015 0515   GLUCOSE 114 (H) 12/29/2015 0515   BUN 10 12/29/2015 0515   BUN  10 12/29/2015   CREATININE 0.55 12/29/2015 0515   CREATININE 0.85 11/09/2013 1135   CALCIUM 8.5 (L) 12/29/2015 0515   PROT 6.4 (L) 12/19/2015 1717   ALBUMIN 2.9 (L) 12/19/2015 1717   AST 61 (H) 12/19/2015 1717   ALT 46 12/19/2015 1717   ALKPHOS 52 12/19/2015 1717   BILITOT 0.6 12/19/2015 1717   GFRNONAA >60 12/29/2015 0515   GFRNONAA 64 11/09/2013 1135   GFRAA >60 12/29/2015 0515   GFRAA 74 11/09/2013 1135    Recent Labs  11/23/15 1631  12/15/15 0344  12/27/15 0346 12/28/15  12/28/15 0431 12/29/15 12/29/15 0515  NA 144  < > 142  < > 142 139 139 139 139  K 4.6  < > 3.9  < > 3.5 3.2* 3.2*  --  3.8  CL 110  < > 111  < > 110  --  106  --  107  CO2 27  < > 23  < > 23  --  23  --  21*  GLUCOSE 126*  < > 95  < > 106*  --  110*  --  114*  BUN 28*  < > 15  < > 6 12 12 10 10   CREATININE 0.93  < > 0.74  < > 0.50 0.6 0.60 0.6 0.55  CALCIUM 9.0  < > 8.9  < > 8.4*  --  8.3*  --  8.5*  MG 2.1  --  1.5*  --   --   --   --   --   --   < > = values in this interval not displayed.  Recent Labs  12/12/15 0442 12/13/15 0439 12/19/15 1717  AST 20 21 61*  ALT 9* 10* 46  ALKPHOS 58 61 52  BILITOT 0.6 0.9 0.6  PROT 5.3* 5.5* 6.4*  ALBUMIN 2.5* 2.7* 2.9*    Recent Labs  12/13/15 12/13/15 0439  12/19/15 1717  12/25/15 0358 12/26/15 12/26/15 0416 12/27/15 12/27/15 0346  WBC 9.0 9.0  < > 7.8  < > 8.2 5.7 5.7 5.7 5.7  NEUTROABS 7 6.9  --  5.4  --   --   --   --   --   --   HGB 10.8* 10.8*  < > 10.9*  < > 8.4* 8.3* 8.3*  --  8.4*  HCT 34* 33.6*  < > 35.2*  < > 27.3* 26* 25.9*  --  27.1*  MCV  --  93.3  < > 93.9  < > 91.0  --  89.0  --  88.6  PLT 215 215  < > 285  < > 331 357 357  --  406*  < > = values in this interval not displayed. No results for input(s): CHOL, LDLCALC, TRIG in the last 8760 hours.  Invalid input(s): HCL No results found for: MICROALBUR Lab Results  Component Value Date   TSH 0.509 06/26/2011   No results found for: HGBA1C No results found for: CHOL, HDL,  LDLCALC, LDLDIRECT, TRIG, CHOLHDL  Significant Diagnostic Results in last 30 days:  No results found.  Assessment and Plan  Anemia of chronic disease Hb 8.4 , stable from prior; have ordered anemia profile  Anxiety Stable ; cont klonopin 0.5 mg qHs helps with sundowning  CKD (chronic kidney disease), stage III GFR > 60; Cr is 0.55, improved; prob stage 2 now ; will monitor at [employer] D. Taylor Coil, MD

## 2016-04-21 ENCOUNTER — Encounter: Payer: Self-pay | Admitting: Internal Medicine

## 2016-04-21 NOTE — Assessment & Plan Note (Signed)
GFR > 60; Cr is 0.55, improved; prob stage 2 now ; will monitor at intervals0

## 2016-04-21 NOTE — Assessment & Plan Note (Signed)
Stable ; cont klonopin 0.5 mg qHs helps with sundowning

## 2016-04-21 NOTE — Assessment & Plan Note (Signed)
Hb 8.4 , stable from prior; have ordered anemia profile

## 2016-04-23 DIAGNOSIS — F411 Generalized anxiety disorder: Secondary | ICD-10-CM | POA: Diagnosis not present

## 2016-04-23 DIAGNOSIS — F0390 Unspecified dementia without behavioral disturbance: Secondary | ICD-10-CM | POA: Diagnosis not present

## 2016-04-23 DIAGNOSIS — F29 Unspecified psychosis not due to a substance or known physiological condition: Secondary | ICD-10-CM | POA: Diagnosis not present

## 2016-04-23 DIAGNOSIS — F39 Unspecified mood [affective] disorder: Secondary | ICD-10-CM | POA: Diagnosis not present

## 2016-04-25 ENCOUNTER — Encounter: Payer: Self-pay | Admitting: Internal Medicine

## 2016-04-25 NOTE — Progress Notes (Signed)
Location:  [place] Room Number: 341P Place of Service:  SNF [phone])  [facility]. [name], MD  Patient Care Team: Pcp Not In System as PCP - General [name], MD (Gastroenterology) [name], NP (Obstetrics and Gynecology)  Extended Emergency Contact Information Primary Emergency Contact: [name],[name] Address: [place], [place] Phone: [phone] Mobile Phone: [phone] Relation: Daughter Secondary Emergency Contact: [name] Phone: [phone] Mobile Phone: [phone] Relation: Daughter    Allergies: Macrobid [nitrofurantoin macrocrystal]; Azithromycin; Doxycycline; Escitalopram oxalate; Fioricet [butalbital-apap-caffeine]; Latex; Levofloxacin; Nsaids; Oxycodone; Restasis [cyclosporine]; Sulfa antibiotics; and Biaxin [clarithromycin]  Chief Complaint  Patient presents with  . Acute Visit    HPI: Patient is 83 y.o. female who is being seen acutely because an outbreak of Influenza A per CDC guidelines was recognized on 03/14/2016. Pt has no c/o flu like symptoms;therefore pt will need to be prophylaxed with Tamiflu for a minimum of 14 days per CDC protocol.  Past Medical History:  Diagnosis Date  . Anxiety   . Arterial tortuosity (aorta) 12/11/2012  . Asthma   . Chronic kidney disease, stage IV (severe) ([place]) 12/11/2012  . Chronic mental illness   . Colitis   . COPD (chronic obstructive pulmonary disease) ([place])   . Depression   . Dysphagia 01/11/2015  . GERD (gastroesophageal reflux disease)   . Insomnia, unspecified   . Migraines   . Nasal bone fracture 03/30/2014  . Osteoporosis   . Schatzki's ring 01/14/2015  . Scoliosis   . Substance abuse    Benbzos and Narcotics, she does not get any of those medicines from [facility], we have empathetically told her that.  . Vertigo   . Vitamin B deficiency   . Weight loss     Past  Surgical History:  Procedure Laterality Date  . ABDOMINAL HYSTERECTOMY    . CATARACT EXTRACTION    . CHOLECYSTECTOMY    . ESOPHAGOGASTRODUODENOSCOPY (EGD) WITH PROPOFOL N/A 01/13/2015   Procedure: ESOPHAGOGASTRODUODENOSCOPY (EGD) WITH PROPOFOL;  Surgeon: [name], MD;  Location: [facility];  Service: Endoscopy;  Laterality: N/A;  . HIP ARTHROPLASTY Right 12/21/2015   Procedure: ARTHROPLASTY BIPOLAR HIP (HEMIARTHROPLASTY);  Surgeon: [name], MD;  Location: [place];  Service: Orthopedics;  Laterality: Right;  . PARTIAL HYSTERECTOMY      Allergies as of 03/15/2016      Reactions   Macrobid [nitrofurantoin Macrocrystal] Rash   Azithromycin Other (See Comments)   unknown   Doxycycline Other (See Comments)   unknown   Escitalopram Oxalate Other (See Comments)   unknown   Fioricet [butalbital-apap-caffeine] Other (See Comments)   Drunk.   Latex Other (See Comments)   unknown   Levofloxacin Other (See Comments)   unknown   Nsaids Other (See Comments)   This is not an allergy. The patient was told by Dr. Rockne Menghini to avoid NSAIDs and stop Vicoprofen in to 2014 to avoid nephrotoxicity.  Allergic reaction not listed on MAR.   Oxycodone Other (See Comments)   reaction to synthetic codeine   Restasis [cyclosporine] Other (See Comments)   unknown   Sulfa Antibiotics Other (See Comments)   Reaction unknown   Biaxin [clarithromycin] Rash   With burning sensation      Medication List       Accurate as of 03/15/16 11:59 PM. Always use your most recent med list.  acetaminophen 325 MG tablet Commonly known as:  TYLENOL Take 650 mg by mouth every 6 (six) hours as needed for headache.   albuterol 108 (90 Base) MCG/ACT inhaler Commonly known as:  PROVENTIL HFA;VENTOLIN HFA Inhale 1 puff into the lungs every 6 (six) hours as needed for shortness of breath.   amLODipine 10 MG tablet Commonly known as:  NORVASC Take 10 mg by mouth daily.   ASPERCREME 10 %  Lotn Generic drug:  Trolamine Salicylate Apply 1 application topically 2 (two) times daily as needed (for painful areas).   Biotin 5000 MCG Caps Take 1 capsule by mouth daily.   clonazePAM 0.5 MG tablet Commonly known as:  KLONOPIN Take 0.25 mg by mouth at bedtime.   Cranberry 425 MG Caps Take 425 mg by mouth 2 (two) times daily.   docusate sodium 100 MG capsule Commonly known as:  COLACE Take 100 mg by mouth 2 (two) times daily.   dorzolamide-timolol 22.3-6.8 MG/ML ophthalmic solution Commonly known as:  COSOPT Place 1 drop into both eyes 2 (two) times daily.   feeding supplement Liqd Take 1 Container by mouth 2 (two) times daily between meals.   NUTRITIONAL SUPPLEMENT Liqd Initiate 8 oz of Med Pass by mouth 2 times daily to support weight status   fludrocortisone 0.1 MG tablet Commonly known as:  FLORINEF Take 0.1 mg by mouth every morning.   HYDROcodone-acetaminophen 5-325 MG tablet Commonly known as:  NORCO/VICODIN Take 1 tablet by mouth 3 (three) times daily. At 9 am , 1 pm , and 9 pm for pain   hydrOXYzine 25 MG capsule Commonly known as:  VISTARIL Take 25 mg by mouth every 6 (six) hours as needed for itching.   lactulose 10 GM/15ML solution Commonly known as:  CHRONULAC Take 15 mLs (10 g total) by mouth daily as needed for severe constipation.   lidocaine 5 % Commonly known as:  LIDODERM Place 1 patch onto the skin every 12 (twelve) hours as needed (for pain). Remove & Discard patch within 12 hours or as directed by MD   metoprolol tartrate 25 MG tablet Commonly known as:  LOPRESSOR Take 25 mg by mouth 2 (two) times daily.   nitroGLYCERIN 0.4 MG SL tablet Commonly known as:  NITROSTAT Place 0.4 mg under the tongue every 5 (five) minutes as needed for chest pain. May use 3 times   pantoprazole 40 MG tablet Commonly known as:  PROTONIX Take 1 tablet (40 mg total) by mouth daily at 6 (six) AM.   polyethylene glycol packet Commonly known as:  MIRALAX /  GLYCOLAX Take 17 g by mouth daily.   QUEtiapine Fumarate 150 MG 24 hr tablet Commonly known as:  SEROQUEL XR Take 150 mg by mouth daily.   ranitidine 150 MG tablet Commonly known as:  ZANTAC Take 150 mg by mouth 2 (two) times daily as needed for heartburn.   senna 8.6 MG tablet Commonly known as:  SENOKOT Take 2 tablets by mouth 2 (two) times daily as needed for constipation.   travoprost (benzalkonium) 0.004 % ophthalmic solution Commonly known as:  TRAVATAN Place 1 drop into both eyes at bedtime.   Vitamin D 1000 units capsule Take 1,000 Units by mouth daily with breakfast.       No orders of the defined types were placed in this encounter.   Immunization History  Administered Date(s) Administered  . Influenza Split 11/13/2011  . Influenza-Unspecified 02/13/2008, 11/30/2013  . PPD Test 12/15/2015  . Pneumococcal-Unspecified 02/13/2008  .  Td 02/12/2009    Social History  Substance Use Topics  . Smoking status: Former Smoker    Packs/day: 0.50    Types: Cigarettes  . Smokeless tobacco: Never Used  . Alcohol use No    Review of Systems  DATA OBTAINED: from patient, nurse GENERAL:  no fevers SKIN: No itching, rash HEENT: no rhinorrhea, congestion, ST or ear pain RESPIRATORY: No cough, wheezing, SOB CARDIAC: No chest pain, palpitations, lower extremity edema  GI: No abdominal pain, No N/V/D or constipation, No heartburn or reflux  MUSCULOSKELETAL: No muscle aches NEUROLOGIC: No headache, dizziness   Vitals:   03/15/16 1532  BP: (!) 162/89  Pulse: 73  Resp: 16  Temp: 97.5 F (36.4 C)   Body mass index is 18.9 kg/m. Physical Exam  GENERAL APPEARANCE: Alert, conversant, No acute distress  SKIN: No diaphoresis rash HEENT: Unremarkable RESPIRATORY: Breathing is even, unlabored. Lung sounds are clear   CARDIOVASCULAR: Heart RRR no murmurs, rubs or gallops. No peripheral edema  GASTROINTESTINAL: Abdomen is soft, non-tender, not distended w/ normal  bowel sounds.   NEUROLOGIC: Cranial nerves 2-12 grossly intact PSYCHIATRIC: baseline, no mental status changes  Patient Active Problem List   Diagnosis Date Noted  . Dementia without behavioral disturbance 03/25/2016  . S/P ORIF (open reduction internal fixation) fracture 01/08/2016  . Oral thrush 01/08/2016  . HTN (hypertension) 01/08/2016  . Closed hip fracture requiring operative repair, right, with routine healing, subsequent encounter 12/19/2015  . Bipolar disease, chronic ([place]) 12/17/2015  . Decubitus ulcer of hip, stage 3 ([name]) 12/11/2015  . Ankle pain   . Encephalopathy   . Malnutrition of moderate degree 01/29/2015  . Altered mental status 01/26/2015  . Nausea and vomiting 01/17/2015  . Esophageal dysmotilities   . Urinary tract infectious disease   . Weakness 01/14/2015  . Schatzki's ring 01/14/2015  . CKD (chronic kidney disease), stage III 01/14/2015  . Esophagus disorder   . Dysphagia 01/11/2015  . UTI (lower urinary tract infection) 03/30/2014  . Hearing loss, sensorineural 03/04/2014  . Nasal lesion 03/04/2014  . Unspecified constipation 12/12/2012  . Hypokalemia 12/11/2012  . Orthostatic hypotension 12/11/2012  . Arterial tortuosity (aorta) 12/11/2012  . Protein-calorie malnutrition, severe ([place]) 12/10/2012  . Decreased rectal sphincter tone 11/21/2012  . Idiopathic scoliosis 07/08/2012  . Macular degeneration 06/26/2011  . Vision disturbance 06/26/2011  . Glaucoma 06/26/2011  . Anemia of chronic disease 04/17/2011  . Drug-seeking behavior 02/26/2011  . Osteoporosis 02/26/2011  . Depression   . Anxiety   . COPD (chronic obstructive pulmonary disease) ([place])   . GERD (gastroesophageal reflux disease)   . Migraines   . Insomnia, unspecified     CMP     Component Value Date/Time   NA 139 12/29/2015 0515   NA 139 12/29/2015   K 3.8 12/29/2015 0515   CL 107 12/29/2015 0515   CO2 21 (L) 12/29/2015 0515   GLUCOSE 114 (H) 12/29/2015 0515   BUN 10  12/29/2015 0515   BUN 10 12/29/2015   CREATININE 0.55 12/29/2015 0515   CREATININE 0.85 11/09/2013 1135   CALCIUM 8.5 (L) 12/29/2015 0515   PROT 6.4 (L) 12/19/2015 1717   ALBUMIN 2.9 (L) 12/19/2015 1717   AST 61 (H) 12/19/2015 1717   ALT 46 12/19/2015 1717   ALKPHOS 52 12/19/2015 1717   BILITOT 0.6 12/19/2015 1717   GFRNONAA >60 12/29/2015 0515   GFRNONAA 64 11/09/2013 1135   GFRAA >60 12/29/2015 0515   GFRAA 74 11/09/2013 1135  Recent Labs  11/23/15 1631  12/15/15 0344  12/27/15 0346 12/28/15 12/28/15 0431 12/29/15 12/29/15 0515  NA 144  < > 142  < > 142 139 139 139 139  K 4.6  < > 3.9  < > 3.5 3.2* 3.2*  --  3.8  CL 110  < > 111  < > 110  --  106  --  107  CO2 27  < > 23  < > 23  --  23  --  21*  GLUCOSE 126*  < > 95  < > 106*  --  110*  --  114*  BUN 28*  < > 15  < > 6 12 12 10 10   CREATININE 0.93  < > 0.74  < > 0.50 0.6 0.60 0.6 0.55  CALCIUM 9.0  < > 8.9  < > 8.4*  --  8.3*  --  8.5*  MG 2.1  --  1.5*  --   --   --   --   --   --   < > = values in this interval not displayed.  Recent Labs  12/12/15 0442 12/13/15 0439 12/19/15 1717  AST 20 21 61*  ALT 9* 10* 46  ALKPHOS 58 61 52  BILITOT 0.6 0.9 0.6  PROT 5.3* 5.5* 6.4*  ALBUMIN 2.5* 2.7* 2.9*    Recent Labs  12/13/15 12/13/15 0439  12/19/15 1717  12/25/15 0358 12/26/15 12/26/15 0416 12/27/15 12/27/15 0346  WBC 9.0 9.0  < > 7.8  < > 8.2 5.7 5.7 5.7 5.7  NEUTROABS 7 6.9  --  5.4  --   --   --   --   --   --   HGB 10.8* 10.8*  < > 10.9*  < > 8.4* 8.3* 8.3*  --  8.4*  HCT 34* 33.6*  < > 35.2*  < > 27.3* 26* 25.9*  --  27.1*  MCV  --  93.3  < > 93.9  < > 91.0  --  89.0  --  88.6  PLT 215 215  < > 285  < > 331 357 357  --  406*  < > = values in this interval not displayed. No results for input(s): CHOL, LDLCALC, TRIG in the last 8760 hours.  Invalid input(s): HCL No results found for: MICROALBUR Lab Results  Component Value Date   TSH 0.509 06/26/2011   No results found for: HGBA1C No results  found for: CHOL, HDL, LDLCALC, LDLDIRECT, TRIG, CHOLHDL  Significant Diagnostic Results in last 30 days:  No results found.  Assessment and Plan  EXPOSURE TO FLU/ INFLUENZA OUTBREAK AT SNF-   CrCl calculated by me-   59     Dose for 14 days-  30 mg daily Pt will be monitored daily for flu like symptoms                                                                                 Webb Silversmith D. [name], MD

## 2016-05-06 ENCOUNTER — Encounter: Payer: Self-pay | Admitting: Internal Medicine

## 2016-05-08 ENCOUNTER — Encounter: Payer: Self-pay | Admitting: Internal Medicine

## 2016-05-08 NOTE — Progress Notes (Signed)
Location:  [facility] Room Number: 782N Place of Service:  SNF [phone])  [facility]. Taylor Coil, MD  Patient Care Team: Pcp Not In System as PCP - General [name], MD (Gastroenterology) [name], NP (Obstetrics and Gynecology)  Extended Emergency Contact Information Primary Emergency Contact: [name],[name] Address: [place], [place] Phone: [phone] Mobile Phone: [phone] Relation: Daughter Secondary Emergency Contact: [name] Phone: [phone] Mobile Phone: [phone] Relation: Daughter    Allergies: Macrobid [nitrofurantoin macrocrystal]; Azithromycin; Doxycycline; Escitalopram oxalate; Fioricet [butalbital-apap-caffeine]; Latex; Levofloxacin; Nsaids; Oxycodone; Restasis [cyclosporine]; Sulfa antibiotics; and Biaxin [clarithromycin]  Chief Complaint  Patient presents with  . Medical Management of Chronic Issues    Routine Visit    HPI: Patient is 83 y.o. female who   Past Medical History:  Diagnosis Date  . Anxiety   . Arterial tortuosity (aorta) 12/11/2012  . Asthma   . Chronic kidney disease, stage IV (severe) ([facility]) 12/11/2012  . Chronic mental illness   . Colitis   . COPD (chronic obstructive pulmonary disease) ([place])   . Depression   . Dysphagia 01/11/2015  . GERD (gastroesophageal reflux disease)   . Insomnia, unspecified   . Migraines   . Nasal bone fracture 03/30/2014  . Osteoporosis   . Schatzki's ring 01/14/2015  . Scoliosis   . Substance abuse    Benbzos and Narcotics, she does not get any of those medicines from [facility], we have empathetically told her that.  . Vertigo   . Vitamin B deficiency   . Weight loss     Past Surgical History:  Procedure Laterality Date  . ABDOMINAL HYSTERECTOMY    . CATARACT EXTRACTION    . CHOLECYSTECTOMY    . ESOPHAGOGASTRODUODENOSCOPY (EGD) WITH PROPOFOL N/A 01/13/2015   Procedure: ESOPHAGOGASTRODUODENOSCOPY (EGD) WITH PROPOFOL;  Surgeon: [name], MD;  [facility];  Service: Endoscopy;  Laterality: N/A;  . HIP ARTHROPLASTY Right 12/21/2015   Procedure: ARTHROPLASTY BIPOLAR HIP (HEMIARTHROPLASTY);  Surgeon: [name], MD;  Location: [place];  Service: Orthopedics;  Laterality: Right;  . PARTIAL HYSTERECTOMY      Allergies as of 05/08/2016      Reactions   Macrobid [nitrofurantoin Macrocrystal] Rash   Azithromycin Other (See Comments)   unknown   Doxycycline Other (See Comments)   unknown   Escitalopram Oxalate Other (See Comments)   unknown   Fioricet [butalbital-apap-caffeine] Other (See Comments)   Drunk.   Latex Other (See Comments)   unknown   Levofloxacin Other (See Comments)   unknown   Nsaids Other (See Comments)   This is not an allergy. The patient was told by Dr. Rockne Menghini to avoid NSAIDs and stop Vicoprofen in to 2014 to avoid nephrotoxicity.  Allergic reaction not listed on MAR.   Oxycodone Other (See Comments)   reaction to synthetic codeine   Restasis [cyclosporine] Other (See Comments)   unknown   Sulfa Antibiotics Other (See Comments)   Reaction unknown   Biaxin [clarithromycin] Rash   With burning sensation      Medication List       Accurate as of 05/08/16 11:34 AM. Always use your most recent med list.          acetaminophen 325 MG tablet Commonly known as:  TYLENOL Take 650 mg by mouth every 6 (six) hours as needed for headache.   albuterol 108 (90  Base) MCG/ACT inhaler Commonly known as:  PROVENTIL HFA;VENTOLIN HFA Inhale 1 puff into the lungs every 6 (six) hours as needed for shortness of breath.   amLODipine 10 MG tablet Commonly known as:  NORVASC Take 10 mg by mouth daily.   ASPERCREME 10 % Lotn Generic drug:  Trolamine Salicylate Apply 1 application topically 2 (two) times daily as needed (for painful areas).   Biotin 5000 MCG Caps Take 1 capsule by mouth daily.   clonazePAM 0.5  MG tablet Commonly known as:  KLONOPIN Take 0.25 mg by mouth at bedtime.   Cranberry 425 MG Caps Take 425 mg by mouth 2 (two) times daily.   docusate sodium 100 MG capsule Commonly known as:  COLACE Take 100 mg by mouth 2 (two) times daily.   dorzolamide-timolol 22.3-6.8 MG/ML ophthalmic solution Commonly known as:  COSOPT Place 1 drop into both eyes 2 (two) times daily.   feeding supplement Liqd Take 1 Container by mouth 2 (two) times daily between meals.   NUTRITIONAL SUPPLEMENT Liqd Initiate 8 oz of Med Pass by mouth 2 times daily to support weight status   fludrocortisone 0.1 MG tablet Commonly known as:  FLORINEF Take 0.1 mg by mouth every morning.   HYDROcodone-acetaminophen 5-325 MG tablet Commonly known as:  NORCO/VICODIN Take 1 tablet by mouth 3 (three) times daily. At 9 am , 1 pm , and 9 pm for pain   hydrOXYzine 25 MG capsule Commonly known as:  VISTARIL Take 25 mg by mouth every 6 (six) hours as needed for itching.   lactulose 10 GM/15ML solution Commonly known as:  CHRONULAC Take 15 mLs (10 g total) by mouth daily as needed for severe constipation.   lidocaine 5 % Commonly known as:  LIDODERM Place 1 patch onto the skin every 12 (twelve) hours as needed (for pain). Remove & Discard patch within 12 hours or as directed by MD   metoprolol tartrate 25 MG tablet Commonly known as:  LOPRESSOR Take 25 mg by mouth 2 (two) times daily.   nitroGLYCERIN 0.4 MG SL tablet Commonly known as:  NITROSTAT Place 0.4 mg under the tongue every 5 (five) minutes as needed for chest pain. May use 3 times   pantoprazole 40 MG tablet Commonly known as:  PROTONIX Take 1 tablet (40 mg total) by mouth daily at 6 (six) AM.   polyethylene glycol packet Commonly known as:  MIRALAX / GLYCOLAX Take 17 g by mouth daily.   QUEtiapine Fumarate 150 MG 24 hr tablet Commonly known as:  SEROQUEL XR Take 150 mg by mouth daily.   ranitidine 150 MG tablet Commonly known as:   ZANTAC Take 150 mg by mouth 2 (two) times daily as needed for heartburn.   senna 8.6 MG tablet Commonly known as:  SENOKOT Take 2 tablets by mouth 2 (two) times daily as needed for constipation.   travoprost (benzalkonium) 0.004 % ophthalmic solution Commonly known as:  TRAVATAN Place 1 drop into both eyes at bedtime.   Vitamin D 1000 units capsule Take 1,000 Units by mouth daily with breakfast.       No orders of the defined types were placed in this encounter.   Immunization History  Administered Date(s) Administered  . Influenza Split 11/13/2011  . Influenza-Unspecified 02/13/2008, 11/30/2013  . PPD Test 12/15/2015  . Pneumococcal-Unspecified 02/13/2008  . Td 02/12/2009    Social History  Substance Use Topics  . Smoking status: Former Smoker    Packs/day: 0.50    Types: Cigarettes  .  Smokeless tobacco: Never Used  . Alcohol use No    Review of Systems  DATA OBTAINED: from patient, nurse, medical record, family member GENERAL:  no fevers, fatigue, appetite changes SKIN: No itching, rash HEENT: No complaint RESPIRATORY: No cough, wheezing, SOB CARDIAC: No chest pain, palpitations, lower extremity edema  GI: No abdominal pain, No N/V/D or constipation, No heartburn or reflux  GU: No dysuria, frequency or urgency, or incontinence  MUSCULOSKELETAL: No unrelieved bone/joint pain NEUROLOGIC: No headache, dizziness  PSYCHIATRIC: No overt anxiety or sadness  Vitals:   05/08/16 1132  BP: (!) 144/81  Pulse: 65  Resp: 20  Temp: 97.5 F (36.4 C)   Body mass index is 18.9 kg/m. Physical Exam  GENERAL APPEARANCE: Alert, conversant, No acute distress  SKIN: No diaphoresis rash HEENT: Unremarkable RESPIRATORY: Breathing is even, unlabored. Lung sounds are clear   CARDIOVASCULAR: Heart RRR no murmurs, rubs or gallops. No peripheral edema  GASTROINTESTINAL: Abdomen is soft, non-tender, not distended w/ normal bowel sounds.  GENITOURINARY: Bladder non tender, not  distended  MUSCULOSKELETAL: No abnormal joints or musculature NEUROLOGIC: Cranial nerves 2-12 grossly intact. Moves all extremities PSYCHIATRIC: Mood and affect appropriate to situation, no behavioral issues  Patient Active Problem List   Diagnosis Date Noted  . Dementia without behavioral disturbance 03/25/2016  . S/P ORIF (open reduction internal fixation) fracture 01/08/2016  . Oral thrush 01/08/2016  . HTN (hypertension) 01/08/2016  . Closed hip fracture requiring operative repair, right, with routine healing, subsequent encounter 12/19/2015  . Bipolar disease, chronic ([place]) 12/17/2015  . Decubitus ulcer of hip, stage 3 ([place]) 12/11/2015  . Ankle pain   . Encephalopathy   . Malnutrition of moderate degree 01/29/2015  . Altered mental status 01/26/2015  . Nausea and vomiting 01/17/2015  . Esophageal dysmotilities   . Urinary tract infectious disease   . Weakness 01/14/2015  . Schatzki's ring 01/14/2015  . CKD (chronic kidney disease), stage III 01/14/2015  . Esophagus disorder   . Dysphagia 01/11/2015  . UTI (lower urinary tract infection) 03/30/2014  . Hearing loss, sensorineural 03/04/2014  . Nasal lesion 03/04/2014  . Unspecified constipation 12/12/2012  . Hypokalemia 12/11/2012  . Orthostatic hypotension 12/11/2012  . Arterial tortuosity (aorta) 12/11/2012  . Protein-calorie malnutrition, severe ([place]) 12/10/2012  . Decreased rectal sphincter tone 11/21/2012  . Idiopathic scoliosis 07/08/2012  . Macular degeneration 06/26/2011  . Vision disturbance 06/26/2011  . Glaucoma 06/26/2011  . Anemia of chronic disease 04/17/2011  . Drug-seeking behavior 02/26/2011  . Osteoporosis 02/26/2011  . Depression   . Anxiety   . COPD (chronic obstructive pulmonary disease) ([place])   . GERD (gastroesophageal reflux disease)   . Migraines   . Insomnia, unspecified     CMP     Component Value Date/Time   NA 139 12/29/2015 0515   NA 139 12/29/2015   K 3.8 12/29/2015 0515   CL  107 12/29/2015 0515   CO2 21 (L) 12/29/2015 0515   GLUCOSE 114 (H) 12/29/2015 0515   BUN 10 12/29/2015 0515   BUN 10 12/29/2015   CREATININE 0.55 12/29/2015 0515   CREATININE 0.85 11/09/2013 1135   CALCIUM 8.5 (L) 12/29/2015 0515   PROT 6.4 (L) 12/19/2015 1717   ALBUMIN 2.9 (L) 12/19/2015 1717   AST 61 (H) 12/19/2015 1717   ALT 46 12/19/2015 1717   ALKPHOS 52 12/19/2015 1717   BILITOT 0.6 12/19/2015 1717   GFRNONAA >60 12/29/2015 0515   GFRNONAA 64 11/09/2013 1135   GFRAA >60 12/29/2015 0515  GFRAA 74 11/09/2013 1135    Recent Labs  11/23/15 1631  12/15/15 0344  12/27/15 0346 12/28/15 12/28/15 0431 12/29/15 12/29/15 0515  NA 144  < > 142  < > 142 139 139 139 139  K 4.6  < > 3.9  < > 3.5 3.2* 3.2*  --  3.8  CL 110  < > 111  < > 110  --  106  --  107  CO2 27  < > 23  < > 23  --  23  --  21*  GLUCOSE 126*  < > 95  < > 106*  --  110*  --  114*  BUN 28*  < > 15  < > 6 12 12 10 10   CREATININE 0.93  < > 0.74  < > 0.50 0.6 0.60 0.6 0.55  CALCIUM 9.0  < > 8.9  < > 8.4*  --  8.3*  --  8.5*  MG 2.1  --  1.5*  --   --   --   --   --   --   < > = values in this interval not displayed.  Recent Labs  12/12/15 0442 12/13/15 0439 12/19/15 1717  AST 20 21 61*  ALT 9* 10* 46  ALKPHOS 58 61 52  BILITOT 0.6 0.9 0.6  PROT 5.3* 5.5* 6.4*  ALBUMIN 2.5* 2.7* 2.9*    Recent Labs  12/13/15 12/13/15 0439  12/19/15 1717  12/25/15 0358 12/26/15 12/26/15 0416 12/27/15 12/27/15 0346  WBC 9.0 9.0  < > 7.8  < > 8.2 5.7 5.7 5.7 5.7  NEUTROABS 7 6.9  --  5.4  --   --   --   --   --   --   HGB 10.8* 10.8*  < > 10.9*  < > 8.4* 8.3* 8.3*  --  8.4*  HCT 34* 33.6*  < > 35.2*  < > 27.3* 26* 25.9*  --  27.1*  MCV  --  93.3  < > 93.9  < > 91.0  --  89.0  --  88.6  PLT 215 215  < > 285  < > 331 357 357  --  406*  < > = values in this interval not displayed. No results for input(s): CHOL, LDLCALC, TRIG in the last 8760 hours.  Invalid input(s): HCL No results found for: MICROALBUR Lab Results   Component Value Date   TSH 0.509 06/26/2011   No results found for: HGBA1C No results found for: CHOL, HDL, LDLCALC, LDLDIRECT, TRIG, CHOLHDL  Significant Diagnostic Results in last 30 days:  No results found.  Assessment and Plan  No problem-specific Assessment & Plan notes found for this encounter.   Labs/tests ordered:    [facility]. [facility], MD

## 2016-05-09 ENCOUNTER — Non-Acute Institutional Stay (SKILLED_NURSING_FACILITY): Payer: Medicare Other | Admitting: Internal Medicine

## 2016-05-09 ENCOUNTER — Encounter: Payer: Self-pay | Admitting: Internal Medicine

## 2016-05-09 DIAGNOSIS — K219 Gastro-esophageal reflux disease without esophagitis: Secondary | ICD-10-CM | POA: Diagnosis not present

## 2016-05-09 DIAGNOSIS — J449 Chronic obstructive pulmonary disease, unspecified: Secondary | ICD-10-CM

## 2016-05-09 DIAGNOSIS — H409 Unspecified glaucoma: Secondary | ICD-10-CM

## 2016-05-09 NOTE — Progress Notes (Signed)
Location:  [facility] Room Number: 387F Place of Service:  SNF [phone])  Taylor Roth. Taylor Coil, MD  Patient Care Team: Pcp Not In System as PCP - General [name], MD (Gastroenterology) [name], NP (Obstetrics and Gynecology)  Extended Emergency Contact Information Primary Emergency Contact: [name],[name] Address: [place], [place] Phone: [phone] Mobile Phone: [phone] Relation: Daughter Secondary Emergency Contact: [name] Phone: [phone] Mobile Phone: [phone] Relation: Daughter    Allergies: Macrobid [nitrofurantoin macrocrystal]; Azithromycin; Doxycycline; Escitalopram oxalate; Fioricet [butalbital-apap-caffeine]; Latex; Levofloxacin; Nsaids; Oxycodone; Restasis [cyclosporine]; Sulfa antibiotics; and Biaxin [clarithromycin]  Chief Complaint  Patient presents with  . Medical Management of Chronic Issues    Routine Visit    HPI: Patient is 83 y.o. female who is being seen for routine issues of COPD, GERD, and glaucoma.  Past Medical History:  Diagnosis Date  . Anxiety   . Arterial tortuosity (aorta) 12/11/2012  . Asthma   . Chronic kidney disease, stage IV (severe) ([place]) 12/11/2012  . Chronic mental illness   . Colitis   . COPD (chronic obstructive pulmonary disease) ([place])   . Depression   . Dysphagia 01/11/2015  . GERD (gastroesophageal reflux disease)   . Insomnia, unspecified   . Migraines   . Nasal bone fracture 03/30/2014  . Osteoporosis   . Schatzki's ring 01/14/2015  . Scoliosis   . Substance abuse    Benbzos and Narcotics, she does not get any of those medicines from [facility], we have empathetically told her that.  . Vertigo   . Vitamin B deficiency   . Weight loss     Past Surgical History:  Procedure Laterality Date  . ABDOMINAL HYSTERECTOMY    . CATARACT EXTRACTION    . CHOLECYSTECTOMY    .  ESOPHAGOGASTRODUODENOSCOPY (EGD) WITH PROPOFOL N/A 01/13/2015   Procedure: ESOPHAGOGASTRODUODENOSCOPY (EGD) WITH PROPOFOL;  Surgeon: [name], MD;  Location: [facility];  Service: Endoscopy;  Laterality: N/A;  . HIP ARTHROPLASTY Right 12/21/2015   Procedure: ARTHROPLASTY BIPOLAR HIP (HEMIARTHROPLASTY);  Surgeon: [name], MD;  Location: [facility];  Service: Orthopedics;  Laterality: Right;  . PARTIAL HYSTERECTOMY      Allergies as of 05/09/2016      Reactions   Macrobid [nitrofurantoin Macrocrystal] Rash   Azithromycin Other (See Comments)   unknown   Doxycycline Other (See Comments)   unknown   Escitalopram Oxalate Other (See Comments)   unknown   Fioricet [butalbital-apap-caffeine] Other (See Comments)   Drunk.   Latex Other (See Comments)   unknown   Levofloxacin Other (See Comments)   unknown   Nsaids Other (See Comments)   This is not an allergy. The patient was told by Dr. Rockne Menghini to avoid NSAIDs and stop Vicoprofen in to 2014 to avoid nephrotoxicity.  Allergic reaction not listed on MAR.   Oxycodone Other (See Comments)   reaction to synthetic codeine   Restasis [cyclosporine] Other (See Comments)   unknown   Sulfa Antibiotics Other (See Comments)   Reaction unknown   Biaxin [clarithromycin] Rash   With burning sensation      Medication List       Accurate as of 05/09/16 11:59 PM. Always use your most recent med list.          acetaminophen 325 MG tablet Commonly known as:  TYLENOL Take 650 mg by mouth every 6 (  six) hours as needed for headache.   albuterol 108 (90 Base) MCG/ACT inhaler Commonly known as:  PROVENTIL HFA;VENTOLIN HFA Inhale 1 puff into the lungs every 6 (six) hours as needed for shortness of breath.   amLODipine 10 MG tablet Commonly known as:  NORVASC Take 10 mg by mouth daily.   ASPERCREME 10 % Lotn Generic drug:  Trolamine Salicylate Apply 1 application topically 2 (two) times daily as needed (for painful areas).   Biotin  5000 MCG Caps Take 1 capsule by mouth daily.   clonazePAM 0.5 MG tablet Commonly known as:  KLONOPIN Take 0.25 mg by mouth at bedtime.   Cranberry 425 MG Caps Take 425 mg by mouth 2 (two) times daily.   docusate sodium 100 MG capsule Commonly known as:  COLACE Take 100 mg by mouth 2 (two) times daily.   dorzolamide-timolol 22.3-6.8 MG/ML ophthalmic solution Commonly known as:  COSOPT Place 1 drop into both eyes 2 (two) times daily.   feeding supplement Liqd Take 1 Container by mouth 2 (two) times daily between meals.   NUTRITIONAL SUPPLEMENT Liqd Initiate 8 oz of Med Pass by mouth 2 times daily to support weight status   fludrocortisone 0.1 MG tablet Commonly known as:  FLORINEF Take 0.1 mg by mouth every morning.   HYDROcodone-acetaminophen 5-325 MG tablet Commonly known as:  NORCO/VICODIN Take 1 tablet by mouth 3 (three) times daily. At 9 am , 1 pm , and 9 pm for pain   hydrOXYzine 25 MG capsule Commonly known as:  VISTARIL Take 25 mg by mouth every 6 (six) hours as needed for itching.   lactulose 10 GM/15ML solution Commonly known as:  CHRONULAC Take 15 mLs (10 g total) by mouth daily as needed for severe constipation.   lidocaine 5 % Commonly known as:  LIDODERM Place 1 patch onto the skin every 12 (twelve) hours as needed (for pain). Remove & Discard patch within 12 hours or as directed by MD   metoprolol tartrate 25 MG tablet Commonly known as:  LOPRESSOR Take 25 mg by mouth 2 (two) times daily.   nitroGLYCERIN 0.4 MG SL tablet Commonly known as:  NITROSTAT Place 0.4 mg under the tongue every 5 (five) minutes as needed for chest pain. May use 3 times   pantoprazole 40 MG tablet Commonly known as:  PROTONIX Take 1 tablet (40 mg total) by mouth daily at 6 (six) AM.   polyethylene glycol packet Commonly known as:  MIRALAX / GLYCOLAX Take 17 g by mouth daily.   QUEtiapine Fumarate 150 MG 24 hr tablet Commonly known as:  SEROQUEL XR Take 150 mg by mouth  daily.   ranitidine 150 MG tablet Commonly known as:  ZANTAC Take 150 mg by mouth 2 (two) times daily as needed for heartburn.   senna 8.6 MG tablet Commonly known as:  SENOKOT Take 2 tablets by mouth 2 (two) times daily as needed for constipation.   travoprost (benzalkonium) 0.004 % ophthalmic solution Commonly known as:  TRAVATAN Place 1 drop into both eyes at bedtime.   Vitamin D 1000 units capsule Take 1,000 Units by mouth daily with breakfast.       No orders of the defined types were placed in this encounter.   Immunization History  Administered Date(s) Administered  . Influenza Split 11/13/2011  . Influenza-Unspecified 02/13/2008, 11/30/2013  . PPD Test 12/15/2015  . Pneumococcal-Unspecified 02/13/2008  . Td 02/12/2009    Social History  Substance Use Topics  . Smoking status: Former  Smoker    Packs/day: 0.50    Types: Cigarettes  . Smokeless tobacco: Never Used  . Alcohol use No    Review of Systems  DATA OBTAINED: from patient- can participate in limited capacity; nursing without concerns GENERAL:  no fevers, fatigue, appetite changes SKIN: No itching, rash HEENT: No complaint RESPIRATORY: No cough, wheezing, SOB CARDIAC: No chest pain, palpitations, lower extremity edema  GI: No abdominal pain, No N/V/D or constipation, No heartburn or reflux  GU: No dysuria, frequency or urgency, or incontinence  MUSCULOSKELETAL: No unrelieved bone/joint pain NEUROLOGIC: No headache, dizziness  PSYCHIATRIC: No overt anxiety or sadness  Vitals:   05/09/16 0856  BP: (!) 144/81  Pulse: 65  Resp: 20  Temp: 97.5 F (36.4 C)   Body mass index is 18.91 kg/m. Physical Exam  GENERAL APPEARANCE: Alert, mod conversant, No acute distress  SKIN: No diaphoresis rash HEENT: Unremarkable RESPIRATORY: Breathing is even, unlabored. Lung sounds are clear   CARDIOVASCULAR: Heart RRR no murmurs, rubs or gallops. No peripheral edema  GASTROINTESTINAL: Abdomen is soft,  non-tender, not distended w/ normal bowel sounds.  GENITOURINARY: Bladder non tender, not distended  MUSCULOSKELETAL: No abnormal joints or musculature NEUROLOGIC: Cranial nerves 2-12 grossly intact. Moves all extremities PSYCHIATRIC: Mood and affect with dementia, no behavioral issues  Patient Active Problem List   Diagnosis Date Noted  . Dementia without behavioral disturbance 03/25/2016  . S/P ORIF (open reduction internal fixation) fracture 01/08/2016  . Oral thrush 01/08/2016  . HTN (hypertension) 01/08/2016  . Closed hip fracture requiring operative repair, right, with routine healing, subsequent encounter 12/19/2015  . Bipolar disease, chronic ([place]) 12/17/2015  . Decubitus ulcer of hip, stage 3 ([place]) 12/11/2015  . Ankle pain   . Encephalopathy   . Malnutrition of moderate degree 01/29/2015  . Altered mental status 01/26/2015  . Nausea and vomiting 01/17/2015  . Esophageal dysmotilities   . Urinary tract infectious disease   . Weakness 01/14/2015  . Schatzki's ring 01/14/2015  . CKD (chronic kidney disease), stage III 01/14/2015  . Esophagus disorder   . Dysphagia 01/11/2015  . UTI (lower urinary tract infection) 03/30/2014  . Hearing loss, sensorineural 03/04/2014  . Nasal lesion 03/04/2014  . Unspecified constipation 12/12/2012  . Hypokalemia 12/11/2012  . Orthostatic hypotension 12/11/2012  . Arterial tortuosity (aorta) 12/11/2012  . Protein-calorie malnutrition, severe ([place]) 12/10/2012  . Decreased rectal sphincter tone 11/21/2012  . Idiopathic scoliosis 07/08/2012  . Macular degeneration 06/26/2011  . Vision disturbance 06/26/2011  . Glaucoma 06/26/2011  . Anemia of chronic disease 04/17/2011  . Drug-seeking behavior 02/26/2011  . Osteoporosis 02/26/2011  . Depression   . Anxiety   . COPD (chronic obstructive pulmonary disease) ([place])   . GERD (gastroesophageal reflux disease)   . Migraines   . Insomnia, unspecified     CMP     Component Value Date/Time     NA 139 12/29/2015 0515   NA 139 12/29/2015   K 3.8 12/29/2015 0515   CL 107 12/29/2015 0515   CO2 21 (L) 12/29/2015 0515   GLUCOSE 114 (H) 12/29/2015 0515   BUN 10 12/29/2015 0515   BUN 10 12/29/2015   CREATININE 0.55 12/29/2015 0515   CREATININE 0.85 11/09/2013 1135   CALCIUM 8.5 (L) 12/29/2015 0515   PROT 6.4 (L) 12/19/2015 1717   ALBUMIN 2.9 (L) 12/19/2015 1717   AST 61 (H) 12/19/2015 1717   ALT 46 12/19/2015 1717   ALKPHOS 52 12/19/2015 1717   BILITOT 0.6 12/19/2015 1717  GFRNONAA >60 12/29/2015 0515   GFRNONAA 64 11/09/2013 1135   GFRAA >60 12/29/2015 0515   GFRAA 74 11/09/2013 1135    Recent Labs  11/23/15 1631  12/15/15 0344  12/27/15 0346 12/28/15 12/28/15 0431 12/29/15 12/29/15 0515  NA 144  < > 142  < > 142 139 139 139 139  K 4.6  < > 3.9  < > 3.5 3.2* 3.2*  --  3.8  CL 110  < > 111  < > 110  --  106  --  107  CO2 27  < > 23  < > 23  --  23  --  21*  GLUCOSE 126*  < > 95  < > 106*  --  110*  --  114*  BUN 28*  < > 15  < > 6 12 12 10 10   CREATININE 0.93  < > 0.74  < > 0.50 0.6 0.60 0.6 0.55  CALCIUM 9.0  < > 8.9  < > 8.4*  --  8.3*  --  8.5*  MG 2.1  --  1.5*  --   --   --   --   --   --   < > = values in this interval not displayed.  Recent Labs  12/12/15 0442 12/13/15 0439 12/19/15 1717  AST 20 21 61*  ALT 9* 10* 46  ALKPHOS 58 61 52  BILITOT 0.6 0.9 0.6  PROT 5.3* 5.5* 6.4*  ALBUMIN 2.5* 2.7* 2.9*    Recent Labs  12/13/15 12/13/15 0439  12/19/15 1717  12/25/15 0358  12/26/15 0416 12/27/15 12/27/15 0346 05/22/16  WBC 9.0 9.0  < > 7.8  < > 8.2  < > 5.7 5.7 5.7 9.6  NEUTROABS 7 6.9  --  5.4  --   --   --   --   --   --   --   HGB 10.8* 10.8*  < > 10.9*  < > 8.4*  < > 8.3*  --  8.4* 12.0  HCT 34* 33.6*  < > 35.2*  < > 27.3*  < > 25.9*  --  27.1* 40  MCV  --  93.3  < > 93.9  < > 91.0  --  89.0  --  88.6  --   PLT 215 215  < > 285  < > 331  < > 357  --  406* 235  < > = values in this interval not displayed. No results for input(s): CHOL,  LDLCALC, TRIG in the last 8760 hours.  Invalid input(s): HCL No results found for: MICROALBUR Lab Results  Component Value Date   TSH 0.509 06/26/2011   No results found for: HGBA1C No results found for: CHOL, HDL, LDLCALC, LDLDIRECT, TRIG, CHOLHDL  Significant Diagnostic Results in last 30 days:  No results found.  Assessment and Plan  COPD (chronic obstructive pulmonary disease) (HCC) No known recent exacerbations; plan to cont prn albuterol  GERD (gastroesophageal reflux disease) No reported reflux or aspiration; plan to cont protonix 40 mg daily and prn zantac, rarely used  Glaucoma Stable and chronic; cont cosopt and travatan drops    Anne D. Taylor Coil, MD

## 2016-05-22 DIAGNOSIS — M6281 Muscle weakness (generalized): Secondary | ICD-10-CM | POA: Diagnosis not present

## 2016-05-22 DIAGNOSIS — D519 Vitamin B12 deficiency anemia, unspecified: Secondary | ICD-10-CM | POA: Diagnosis not present

## 2016-05-22 DIAGNOSIS — D649 Anemia, unspecified: Secondary | ICD-10-CM | POA: Diagnosis not present

## 2016-05-22 LAB — CBC AND DIFFERENTIAL
HEMATOCRIT: 40 % (ref 36–46)
HEMOGLOBIN: 12 g/dL (ref 12.0–16.0)
PLATELETS: 235 10*3/uL (ref 150–399)
WBC: 9.6 10^3/mL

## 2016-06-04 DIAGNOSIS — F0390 Unspecified dementia without behavioral disturbance: Secondary | ICD-10-CM | POA: Diagnosis not present

## 2016-06-04 DIAGNOSIS — F39 Unspecified mood [affective] disorder: Secondary | ICD-10-CM | POA: Diagnosis not present

## 2016-06-04 DIAGNOSIS — F411 Generalized anxiety disorder: Secondary | ICD-10-CM | POA: Diagnosis not present

## 2016-06-04 DIAGNOSIS — F29 Unspecified psychosis not due to a substance or known physiological condition: Secondary | ICD-10-CM | POA: Diagnosis not present

## 2016-06-05 DIAGNOSIS — R319 Hematuria, unspecified: Secondary | ICD-10-CM | POA: Diagnosis not present

## 2016-06-05 DIAGNOSIS — N390 Urinary tract infection, site not specified: Secondary | ICD-10-CM | POA: Diagnosis not present

## 2016-06-06 ENCOUNTER — Encounter: Payer: Self-pay | Admitting: Internal Medicine

## 2016-06-06 NOTE — Assessment & Plan Note (Signed)
Stable and chronic; cont cosopt and travatan drops

## 2016-06-06 NOTE — Progress Notes (Signed)
This encounter was created in error - please disregard.

## 2016-06-06 NOTE — Assessment & Plan Note (Signed)
No known recent exacerbations; plan to cont prn albuterol

## 2016-06-06 NOTE — Assessment & Plan Note (Addendum)
No reported reflux or aspiration; plan to cont protonix 40 mg daily and prn zantac, rarely used

## 2016-06-08 ENCOUNTER — Non-Acute Institutional Stay (SKILLED_NURSING_FACILITY): Payer: Medicare Other | Admitting: Internal Medicine

## 2016-06-08 ENCOUNTER — Encounter: Payer: Self-pay | Admitting: Internal Medicine

## 2016-06-08 DIAGNOSIS — G301 Alzheimer's disease with late onset: Secondary | ICD-10-CM

## 2016-06-08 DIAGNOSIS — I951 Orthostatic hypotension: Secondary | ICD-10-CM

## 2016-06-08 DIAGNOSIS — F0280 Dementia in other diseases classified elsewhere without behavioral disturbance: Secondary | ICD-10-CM

## 2016-06-08 DIAGNOSIS — F319 Bipolar disorder, unspecified: Secondary | ICD-10-CM

## 2016-06-08 NOTE — Progress Notes (Signed)
Location:  [facility] Room Number: 778E Place of Service:  SNF [phone])  Taylor Delaine. Roth Coil, MD  Patient Care Team: System, [place] Not In as PCP - General [name], MD (Gastroenterology) [name], NP (Obstetrics and Gynecology)  Extended Emergency Contact Information Primary Emergency Contact: [name],[name] Address: [address], [place] Phone: [phone] Mobile Phone: [phone] Relation: Daughter Secondary Emergency Contact: [name] Phone: [phone] Mobile Phone: [phone] Relation: Daughter    Allergies: Macrobid [nitrofurantoin macrocrystal]; Azithromycin; Doxycycline; Escitalopram oxalate; Fioricet [butalbital-apap-caffeine]; Latex; Levofloxacin; Nsaids; Oxycodone; Restasis [cyclosporine]; Sulfa antibiotics; and Biaxin [clarithromycin]  Chief Complaint  Patient presents with  . Medical Management of Chronic Issues    Routine Visit    HPI: Patient is 83 y.o. female who is being seen for routine issues of orthostatic hypotension, dementia and bipolar dx.  Past Medical History:  Diagnosis Date  . Anxiety   . Arterial tortuosity (aorta) 12/11/2012  . Asthma   . Chronic kidney disease, stage IV (severe) ([place]) 12/11/2012  . Chronic mental illness   . Colitis   . COPD (chronic obstructive pulmonary disease) ([place])   . Depression   . Dysphagia 01/11/2015  . GERD (gastroesophageal reflux disease)   . Insomnia, unspecified   . Migraines   . Nasal bone fracture 03/30/2014  . Osteoporosis   . Schatzki's ring 01/14/2015  . Scoliosis   . Substance abuse    Benbzos and Narcotics, she does not get any of those medicines from [facility], we have empathetically told her that.  . Vertigo   . Vitamin B deficiency   . Weight loss     Past Surgical History:  Procedure Laterality Date  . ABDOMINAL HYSTERECTOMY    . CATARACT EXTRACTION    .  CHOLECYSTECTOMY    . ESOPHAGOGASTRODUODENOSCOPY (EGD) WITH PROPOFOL N/A 01/13/2015   Procedure: ESOPHAGOGASTRODUODENOSCOPY (EGD) WITH PROPOFOL;  Surgeon: [name], MD;  Location: [facility];  Service: Endoscopy;  Laterality: N/A;  . HIP ARTHROPLASTY Right 12/21/2015   Procedure: ARTHROPLASTY BIPOLAR HIP (HEMIARTHROPLASTY);  Surgeon: [name], MD;  Location: [facility];  Service: Orthopedics;  Laterality: Right;  . PARTIAL HYSTERECTOMY      Allergies as of 06/08/2016      Reactions   Macrobid [nitrofurantoin Macrocrystal] Rash   Azithromycin Other (See Comments)   unknown   Doxycycline Other (See Comments)   unknown   Escitalopram Oxalate Other (See Comments)   unknown   Fioricet [butalbital-apap-caffeine] Other (See Comments)   Drunk.   Latex Other (See Comments)   unknown   Levofloxacin Other (See Comments)   unknown   Nsaids Other (See Comments)   This is not an allergy. The patient was told by Dr. Rockne Menghini to avoid NSAIDs and stop Vicoprofen in to 2014 to avoid nephrotoxicity.  Allergic reaction not listed on MAR.   Oxycodone Other (See Comments)   reaction to synthetic codeine   Restasis [cyclosporine] Other (See Comments)   unknown   Sulfa Antibiotics Other (See Comments)   Reaction unknown   Biaxin [clarithromycin] Rash   With burning sensation      Medication List       Accurate as of 06/08/16 11:59 PM. Always use your most recent med list.          acetaminophen 325 MG tablet Commonly known as:  TYLENOL Take 650 mg by mouth  every 6 (six) hours as needed for headache.   albuterol 108 (90 Base) MCG/ACT inhaler Commonly known as:  PROVENTIL HFA;VENTOLIN HFA Inhale 1 puff into the lungs every 6 (six) hours as needed for shortness of breath.   amLODipine 10 MG tablet Commonly known as:  NORVASC Take 10 mg by mouth daily.   ASPERCREME 10 % Lotn Generic drug:  Trolamine Salicylate Apply 1 application topically 2 (two) times daily as needed (for painful  areas).   Biotin 5000 MCG Caps Take 1 capsule by mouth daily.   clonazePAM 0.5 MG tablet Commonly known as:  KLONOPIN Take 0.25 mg by mouth at bedtime.   Cranberry 425 MG Caps Take 425 mg by mouth 2 (two) times daily.   docusate sodium 100 MG capsule Commonly known as:  COLACE Take 100 mg by mouth 2 (two) times daily.   dorzolamide-timolol 22.3-6.8 MG/ML ophthalmic solution Commonly known as:  COSOPT Place 1 drop into both eyes 2 (two) times daily.   feeding supplement Liqd Take 1 Container by mouth 2 (two) times daily between meals.   NUTRITIONAL SUPPLEMENT Liqd Initiate 8 oz of Med Pass by mouth 2 times daily to support weight status   fludrocortisone 0.1 MG tablet Commonly known as:  FLORINEF Take 0.1 mg by mouth every morning.   HYDROcodone-acetaminophen 5-325 MG tablet Commonly known as:  NORCO/VICODIN Take 1 tablet by mouth 3 (three) times daily. At 9 am , 1 pm , and 9 pm for pain   hydrOXYzine 25 MG capsule Commonly known as:  VISTARIL Take 25 mg by mouth every 6 (six) hours as needed for itching.   lactulose 10 GM/15ML solution Commonly known as:  CHRONULAC Take 15 mLs (10 g total) by mouth daily as needed for severe constipation.   lidocaine 5 % Commonly known as:  LIDODERM Place 1 patch onto the skin every 12 (twelve) hours as needed (for pain). Remove & Discard patch within 12 hours or as directed by MD   metoprolol tartrate 25 MG tablet Commonly known as:  LOPRESSOR Take 25 mg by mouth 2 (two) times daily.   nitroGLYCERIN 0.4 MG SL tablet Commonly known as:  NITROSTAT Place 0.4 mg under the tongue every 5 (five) minutes as needed for chest pain. May use 3 times   pantoprazole 40 MG tablet Commonly known as:  PROTONIX Take 1 tablet (40 mg total) by mouth daily at 6 (six) AM.   polyethylene glycol packet Commonly known as:  MIRALAX / GLYCOLAX Take 17 g by mouth daily.   QUEtiapine Fumarate 150 MG 24 hr tablet Commonly known as:  SEROQUEL  XR Take 150 mg by mouth daily.   ranitidine 150 MG tablet Commonly known as:  ZANTAC Take 150 mg by mouth 2 (two) times daily as needed for heartburn.   senna 8.6 MG tablet Commonly known as:  SENOKOT Take 2 tablets by mouth 2 (two) times daily as needed for constipation.   travoprost (benzalkonium) 0.004 % ophthalmic solution Commonly known as:  TRAVATAN Place 1 drop into both eyes at bedtime.   Vitamin D 1000 units capsule Take 1,000 Units by mouth daily with breakfast.       No orders of the defined types were placed in this encounter.   Immunization History  Administered Date(s) Administered  . Influenza Split 11/13/2011  . Influenza-Unspecified 02/13/2008, 11/30/2013  . PPD Test 12/15/2015  . Pneumococcal-Unspecified 02/13/2008  . Td 02/12/2009    Social History  Substance Use Topics  . Smoking  status: Former Smoker    Packs/day: 0.50    Types: Cigarettes  . Smokeless tobacco: Never Used  . Alcohol use No    Review of Systems  DATA OBTAINED: from patient- can not participate fully; nursing with no concerns GENERAL:  no fevers, fatigue, appetite changes SKIN: No itching, rash HEENT: No complaint RESPIRATORY: No cough, wheezing, SOB CARDIAC: No chest pain, palpitations, lower extremity edema  GI: No abdominal pain, No N/V/D or constipation, No heartburn or reflux  GU: No dysuria, frequency or urgency, or incontinence  MUSCULOSKELETAL: No unrelieved bone/joint pain NEUROLOGIC: No headache, dizziness  PSYCHIATRIC: No overt anxiety or sadness  Vitals:   06/08/16 0830  BP: (!) 144/85  Pulse: 73  Resp: 16  Temp: 97.5 F (36.4 C)   Body mass index is 19.39 kg/m. Physical Exam  GENERAL APPEARANCE: Alert,  No acute distress  SKIN: No diaphoresis rash HEENT: Unremarkable RESPIRATORY: Breathing is even, unlabored. Lung sounds are clear   CARDIOVASCULAR: Heart RRR no murmurs, rubs or gallops. No peripheral edema  GASTROINTESTINAL: Abdomen is soft,  non-tender, not distended w/ normal bowel sounds.  GENITOURINARY: Bladder non tender, not distended  MUSCULOSKELETAL: No abnormal joints or musculature NEUROLOGIC: Cranial nerves 2-12 grossly intact. Moves all extremities PSYCHIATRIC: Mood and affect with dementia, no behavioral issues  Patient Active Problem List   Diagnosis Date Noted  . Dementia without behavioral disturbance 03/25/2016  . S/P ORIF (open reduction internal fixation) fracture 01/08/2016  . Oral thrush 01/08/2016  . HTN (hypertension) 01/08/2016  . Closed hip fracture requiring operative repair, right, with routine healing, subsequent encounter 12/19/2015  . Bipolar disease, chronic ([place]) 12/17/2015  . Decubitus ulcer of hip, stage 3 ([place]) 12/11/2015  . Ankle pain   . Encephalopathy   . Malnutrition of moderate degree 01/29/2015  . Altered mental status 01/26/2015  . Nausea and vomiting 01/17/2015  . Esophageal dysmotilities   . Urinary tract infectious disease   . Weakness 01/14/2015  . Schatzki's ring 01/14/2015  . CKD (chronic kidney disease), stage III 01/14/2015  . Esophagus disorder   . Dysphagia 01/11/2015  . UTI (lower urinary tract infection) 03/30/2014  . Hearing loss, sensorineural 03/04/2014  . Nasal lesion 03/04/2014  . Unspecified constipation 12/12/2012  . Hypokalemia 12/11/2012  . Orthostatic hypotension 12/11/2012  . Arterial tortuosity (aorta) 12/11/2012  . Protein-calorie malnutrition, severe ([place]) 12/10/2012  . Decreased rectal sphincter tone 11/21/2012  . Idiopathic scoliosis 07/08/2012  . Macular degeneration 06/26/2011  . Vision disturbance 06/26/2011  . Glaucoma 06/26/2011  . Anemia of chronic disease 04/17/2011  . Drug-seeking behavior 02/26/2011  . Osteoporosis 02/26/2011  . Depression   . Anxiety   . COPD (chronic obstructive pulmonary disease) ([place])   . GERD (gastroesophageal reflux disease)   . Migraines   . Insomnia, unspecified     CMP     Component Value Date/Time     NA 145 06/11/2016   K 4.6 06/11/2016   CL 107 12/29/2015 0515   CO2 21 (L) 12/29/2015 0515   GLUCOSE 114 (H) 12/29/2015 0515   BUN 20 06/11/2016   CREATININE 0.6 06/11/2016   CREATININE 0.55 12/29/2015 0515   CREATININE 0.85 11/09/2013 1135   CALCIUM 8.5 (L) 12/29/2015 0515   PROT 6.4 (L) 12/19/2015 1717   ALBUMIN 2.9 (L) 12/19/2015 1717   AST 16 06/11/2016   ALT 9 06/11/2016   ALKPHOS 89 06/11/2016   BILITOT 0.6 12/19/2015 1717   GFRNONAA >60 12/29/2015 0515   GFRNONAA 64 11/09/2013 1135  GFRAA >60 12/29/2015 0515   GFRAA 74 11/09/2013 1135    Recent Labs  11/23/15 1631  12/15/15 0344  12/27/15 0346  12/28/15 0431 12/29/15 12/29/15 0515 06/11/16  NA 144  < > 142  < > 142  < > 139 139 139 145  K 4.6  < > 3.9  < > 3.5  < > 3.2*  --  3.8 4.6  CL 110  < > 111  < > 110  --  106  --  107  --   CO2 27  < > 23  < > 23  --  23  --  21*  --   GLUCOSE 126*  < > 95  < > 106*  --  110*  --  114*  --   BUN 28*  < > 15  < > 6  < > 12 10 10 20   CREATININE 0.93  < > 0.74  < > 0.50  < > 0.60 0.6 0.55 0.6  CALCIUM 9.0  < > 8.9  < > 8.4*  --  8.3*  --  8.5*  --   MG 2.1  --  1.5*  --   --   --   --   --   --   --   < > = values in this interval not displayed.  Recent Labs  12/12/15 0442 12/13/15 0439 12/19/15 1717 06/11/16  AST 20 21 61* 16  ALT 9* 10* 46 9  ALKPHOS 58 61 52 89  BILITOT 0.6 0.9 0.6  --   PROT 5.3* 5.5* 6.4*  --   ALBUMIN 2.5* 2.7* 2.9*  --     Recent Labs  12/13/15 12/13/15 0439  12/19/15 1717  12/25/15 0358  12/26/15 0416 12/27/15 12/27/15 0346 05/22/16  WBC 9.0 9.0  < > 7.8  < > 8.2  < > 5.7 5.7 5.7 9.6  NEUTROABS 7 6.9  --  5.4  --   --   --   --   --   --   --   HGB 10.8* 10.8*  < > 10.9*  < > 8.4*  < > 8.3*  --  8.4* 12.0  HCT 34* 33.6*  < > 35.2*  < > 27.3*  < > 25.9*  --  27.1* 40  MCV  --  93.3  < > 93.9  < > 91.0  --  89.0  --  88.6  --   PLT 215 215  < > 285  < > 331  < > 357  --  406* 235  < > = values in this interval not  displayed.  Recent Labs  06/11/16  CHOL 190  LDLCALC 116  TRIG 101   No results found for: [facility] Lab Results  Component Value Date   TSH 2.61 06/11/2016   Lab Results  Component Value Date   HGBA1C 5.4 06/11/2016   Lab Results  Component Value Date   CHOL 190 06/11/2016   HDL 54 06/11/2016   LDLCALC 116 06/11/2016   TRIG 101 06/11/2016    Significant Diagnostic Results in last 30 days:  No results found.  Assessment and Plan  Orthostatic hypotension Bp is very good now; can d/c florinef- there is no hx of adrenal insuff       Dementia without behavioral disturbance Pt in no dementia specific meds; will cont supportive care  Bipolar disease, chronic (HCC) Controlled ; cont seroquel 150 mg q HS    Anne D.  Roth Coil, MD

## 2016-06-11 DIAGNOSIS — J449 Chronic obstructive pulmonary disease, unspecified: Secondary | ICD-10-CM | POA: Diagnosis not present

## 2016-06-11 DIAGNOSIS — E119 Type 2 diabetes mellitus without complications: Secondary | ICD-10-CM | POA: Diagnosis not present

## 2016-06-11 DIAGNOSIS — E785 Hyperlipidemia, unspecified: Secondary | ICD-10-CM | POA: Diagnosis not present

## 2016-06-11 DIAGNOSIS — E559 Vitamin D deficiency, unspecified: Secondary | ICD-10-CM | POA: Diagnosis not present

## 2016-06-11 DIAGNOSIS — E039 Hypothyroidism, unspecified: Secondary | ICD-10-CM | POA: Diagnosis not present

## 2016-06-11 LAB — BASIC METABOLIC PANEL
BUN: 20 mg/dL (ref 4–21)
Creatinine: 0.6 mg/dL (ref 0.5–1.1)
Glucose: 78 mg/dL
POTASSIUM: 4.6 mmol/L (ref 3.4–5.3)
SODIUM: 145 mmol/L (ref 137–147)

## 2016-06-11 LAB — HEPATIC FUNCTION PANEL
ALT: 9 U/L (ref 7–35)
AST: 16 U/L (ref 13–35)
Alkaline Phosphatase: 89 U/L (ref 25–125)
BILIRUBIN, TOTAL: 0.2 mg/dL

## 2016-06-11 LAB — VITAMIN D 25 HYDROXY (VIT D DEFICIENCY, FRACTURES): Vit D, 25-Hydroxy: 52.76

## 2016-06-11 LAB — LIPID PANEL
Cholesterol: 190 mg/dL (ref 0–200)
HDL: 54 mg/dL (ref 35–70)
LDL CALC: 116 mg/dL
TRIGLYCERIDES: 101 mg/dL (ref 40–160)

## 2016-06-11 LAB — HEMOGLOBIN A1C: HEMOGLOBIN A1C: 5.4

## 2016-06-11 LAB — TSH: TSH: 2.61 u[IU]/mL (ref 0.41–5.90)

## 2016-06-14 DIAGNOSIS — R1312 Dysphagia, oropharyngeal phase: Secondary | ICD-10-CM | POA: Diagnosis not present

## 2016-06-14 DIAGNOSIS — J449 Chronic obstructive pulmonary disease, unspecified: Secondary | ICD-10-CM | POA: Diagnosis not present

## 2016-06-14 DIAGNOSIS — R262 Difficulty in walking, not elsewhere classified: Secondary | ICD-10-CM | POA: Diagnosis not present

## 2016-06-14 DIAGNOSIS — M6281 Muscle weakness (generalized): Secondary | ICD-10-CM | POA: Diagnosis not present

## 2016-06-18 DIAGNOSIS — F411 Generalized anxiety disorder: Secondary | ICD-10-CM | POA: Diagnosis not present

## 2016-06-18 DIAGNOSIS — R441 Visual hallucinations: Secondary | ICD-10-CM | POA: Diagnosis not present

## 2016-06-18 DIAGNOSIS — F0390 Unspecified dementia without behavioral disturbance: Secondary | ICD-10-CM | POA: Diagnosis not present

## 2016-06-18 DIAGNOSIS — F39 Unspecified mood [affective] disorder: Secondary | ICD-10-CM | POA: Diagnosis not present

## 2016-06-22 DIAGNOSIS — R1312 Dysphagia, oropharyngeal phase: Secondary | ICD-10-CM | POA: Diagnosis not present

## 2016-06-22 DIAGNOSIS — R262 Difficulty in walking, not elsewhere classified: Secondary | ICD-10-CM | POA: Diagnosis not present

## 2016-06-22 DIAGNOSIS — J449 Chronic obstructive pulmonary disease, unspecified: Secondary | ICD-10-CM | POA: Diagnosis not present

## 2016-06-22 DIAGNOSIS — M6281 Muscle weakness (generalized): Secondary | ICD-10-CM | POA: Diagnosis not present

## 2016-06-25 DIAGNOSIS — N390 Urinary tract infection, site not specified: Secondary | ICD-10-CM | POA: Diagnosis not present

## 2016-06-25 DIAGNOSIS — R319 Hematuria, unspecified: Secondary | ICD-10-CM | POA: Diagnosis not present

## 2016-06-27 DIAGNOSIS — M6281 Muscle weakness (generalized): Secondary | ICD-10-CM | POA: Diagnosis not present

## 2016-06-27 DIAGNOSIS — N390 Urinary tract infection, site not specified: Secondary | ICD-10-CM | POA: Diagnosis not present

## 2016-06-27 DIAGNOSIS — J449 Chronic obstructive pulmonary disease, unspecified: Secondary | ICD-10-CM | POA: Diagnosis not present

## 2016-06-27 DIAGNOSIS — F39 Unspecified mood [affective] disorder: Secondary | ICD-10-CM | POA: Diagnosis not present

## 2016-06-27 DIAGNOSIS — R441 Visual hallucinations: Secondary | ICD-10-CM | POA: Diagnosis not present

## 2016-06-27 DIAGNOSIS — F411 Generalized anxiety disorder: Secondary | ICD-10-CM | POA: Diagnosis not present

## 2016-06-27 DIAGNOSIS — E119 Type 2 diabetes mellitus without complications: Secondary | ICD-10-CM | POA: Diagnosis not present

## 2016-06-27 DIAGNOSIS — R319 Hematuria, unspecified: Secondary | ICD-10-CM | POA: Diagnosis not present

## 2016-06-27 DIAGNOSIS — F0390 Unspecified dementia without behavioral disturbance: Secondary | ICD-10-CM | POA: Diagnosis not present

## 2016-07-08 ENCOUNTER — Encounter: Payer: Self-pay | Admitting: Internal Medicine

## 2016-07-08 NOTE — Assessment & Plan Note (Signed)
Pt in no dementia specific meds; will cont supportive care

## 2016-07-08 NOTE — Assessment & Plan Note (Signed)
Bp is very good now; can d/c florinef- there is no hx of adrenal insuff

## 2016-07-08 NOTE — Assessment & Plan Note (Signed)
Controlled ; cont seroquel 150 mg q HS

## 2016-07-10 ENCOUNTER — Encounter: Payer: Self-pay | Admitting: Internal Medicine

## 2016-07-10 ENCOUNTER — Non-Acute Institutional Stay (SKILLED_NURSING_FACILITY): Payer: Medicare Other | Admitting: Internal Medicine

## 2016-07-10 DIAGNOSIS — N183 Chronic kidney disease, stage 3 unspecified: Secondary | ICD-10-CM

## 2016-07-10 DIAGNOSIS — I10 Essential (primary) hypertension: Secondary | ICD-10-CM | POA: Diagnosis not present

## 2016-07-10 DIAGNOSIS — E559 Vitamin D deficiency, unspecified: Secondary | ICD-10-CM | POA: Diagnosis not present

## 2016-07-10 NOTE — Progress Notes (Signed)
Location:  [facility] Room Number: 528U Place of Service:  SNF [phone])  Taylor Delaine. [name], MD  Patient Care Team: [name], MD as PCP - General (Internal Medicine) [name], MD (Gastroenterology) [name], NP (Obstetrics and Gynecology)  Extended Emergency Contact Information Primary Emergency Contact: [name],[name] Address: [place], [place] Phone: [phone] Mobile Phone: [phone] Relation: Daughter Secondary Emergency Contact: [name] Phone: [phone] Mobile Phone: [phone] Relation: Daughter    Allergies: Macrobid [nitrofurantoin macrocrystal]; Azithromycin; Doxycycline; Escitalopram oxalate; Fioricet [butalbital-apap-caffeine]; Latex; Levofloxacin; Nsaids; Oxycodone; Restasis [cyclosporine]; Sulfa antibiotics; and Biaxin [clarithromycin]  Chief Complaint  Patient presents with  . Medical Management of Chronic Issues    Routine Visit    HPI: Patient is 82 y.o. female who Is being seen for routine issues of vitamin D deficiency, chronic kidney disease stage III and hypertension.  Past Medical History:  Diagnosis Date  . Anxiety   . Arterial tortuosity (aorta) 12/11/2012  . Asthma   . Chronic kidney disease, stage IV (severe) ([place]) 12/11/2012  . Chronic mental illness   . Colitis   . COPD (chronic obstructive pulmonary disease) ([place])   . Depression   . Dysphagia 01/11/2015  . GERD (gastroesophageal reflux disease)   . Glaucoma of both eyes   . History of fall   . Insomnia, unspecified   . Migraines   . Nasal bone fracture 03/30/2014  . Osteoporosis   . Presence of right artificial hip joint   . Schatzki's ring 01/14/2015  . Scoliosis   . Substance abuse    Benbzos and Narcotics, she does not get any of those medicines from [facility], we have empathetically told her that.  . Vertigo   . Vitamin B  deficiency   . Weight loss     Past Surgical History:  Procedure Laterality Date  . ABDOMINAL HYSTERECTOMY    . CATARACT EXTRACTION    . CHOLECYSTECTOMY    . ESOPHAGOGASTRODUODENOSCOPY (EGD) WITH PROPOFOL N/A 01/13/2015   Procedure: ESOPHAGOGASTRODUODENOSCOPY (EGD) WITH PROPOFOL;  Surgeon: [name], MD;  Location: [facility];  Service: Endoscopy;  Laterality: N/A;  . HIP ARTHROPLASTY Right 12/21/2015   Procedure: ARTHROPLASTY BIPOLAR HIP (HEMIARTHROPLASTY);  Surgeon: [name], MD;  Location: [facility];  Service: Orthopedics;  Laterality: Right;  . PARTIAL HYSTERECTOMY      Allergies as of 07/10/2016      Reactions   Macrobid [nitrofurantoin Macrocrystal] Rash   Azithromycin Other (See Comments)   unknown   Doxycycline Other (See Comments)   unknown   Escitalopram Oxalate Other (See Comments)   unknown   Fioricet [butalbital-apap-caffeine] Other (See Comments)   Drunk.   Latex Other (See Comments)   unknown   Levofloxacin Other (See Comments)   unknown   Nsaids Other (See Comments)   This is not an allergy. The patient was told by Dr. Rockne Menghini to avoid NSAIDs and stop Vicoprofen in to 2014 to avoid nephrotoxicity.  Allergic reaction not listed on MAR.   Oxycodone Other (See Comments)   reaction to synthetic codeine   Restasis [cyclosporine] Other (See Comments)   unknown   Sulfa Antibiotics Other (See Comments)   Reaction unknown   Biaxin [clarithromycin] Rash   With burning sensation      Medication List       Accurate as of 07/10/16 11:59 PM. Always use  your most recent med list.          acetaminophen 325 MG tablet Commonly known as:  TYLENOL Take 650 mg by mouth every 6 (six) hours as needed for headache.   albuterol 108 (90 Base) MCG/ACT inhaler Commonly known as:  PROVENTIL HFA;VENTOLIN HFA Inhale 1 puff into the lungs every 6 (six) hours as needed for shortness of breath.   amLODipine 10 MG tablet Commonly known as:  NORVASC Take 10 mg by  mouth daily.   ASPERCREME 10 % Lotn Generic drug:  Trolamine Salicylate Apply 1 application topically 2 (two) times daily as needed (for painful areas).   Biotin 5000 MCG Caps Take 1 capsule by mouth daily.   clonazePAM 0.5 MG tablet Commonly known as:  KLONOPIN Take 0.5 mg by mouth 2 (two) times daily.   Cranberry 425 MG Caps Take 425 mg by mouth 2 (two) times daily.   docusate sodium 100 MG capsule Commonly known as:  COLACE Take 100 mg by mouth 2 (two) times daily.   dorzolamide-timolol 22.3-6.8 MG/ML ophthalmic solution Commonly known as:  COSOPT Place 1 drop into both eyes 2 (two) times daily.   feeding supplement Liqd Take 1 Container by mouth 2 (two) times daily between meals.   NUTRITIONAL SUPPLEMENT Liqd Initiate 8 oz of Med Pass by mouth 2 times daily to support weight status   fludrocortisone 0.1 MG tablet Commonly known as:  FLORINEF Take 0.1 mg by mouth every morning.   HYDROcodone-acetaminophen 5-325 MG tablet Commonly known as:  NORCO/VICODIN Take 1 tablet by mouth 3 (three) times daily. At 9 am , 1 pm , and 9 pm for pain   hydrOXYzine 25 MG capsule Commonly known as:  VISTARIL Take 25 mg by mouth every 6 (six) hours as needed for itching.   lactulose 10 GM/15ML solution Commonly known as:  CHRONULAC Take 15 mLs (10 g total) by mouth daily as needed for severe constipation.   lidocaine 5 % Commonly known as:  LIDODERM Place 1 patch onto the skin every 12 (twelve) hours as needed (for pain). Remove & Discard patch within 12 hours or as directed by MD   metoprolol tartrate 25 MG tablet Commonly known as:  LOPRESSOR Take 12.5 mg by mouth 2 (two) times daily.   nitroGLYCERIN 0.4 MG SL tablet Commonly known as:  NITROSTAT Place 0.4 mg under the tongue every 5 (five) minutes as needed for chest pain. May use 3 times   pantoprazole 40 MG tablet Commonly known as:  PROTONIX Take 1 tablet (40 mg total) by mouth daily at 6 (six) AM.   polyethylene  glycol packet Commonly known as:  MIRALAX / GLYCOLAX Take 17 g by mouth daily.   QUEtiapine Fumarate 150 MG 24 hr tablet Commonly known as:  SEROQUEL XR Take 150 mg by mouth daily.   ranitidine 150 MG tablet Commonly known as:  ZANTAC Take 150 mg by mouth 2 (two) times daily as needed for heartburn.   senna 8.6 MG tablet Commonly known as:  SENOKOT Take 2 tablets by mouth 2 (two) times daily as needed for constipation.   travoprost (benzalkonium) 0.004 % ophthalmic solution Commonly known as:  TRAVATAN Place 1 drop into both eyes at bedtime.   Vitamin D 1000 units capsule Take 1,000 Units by mouth daily with breakfast.       No orders of the defined types were placed in this encounter.   Immunization History  Administered Date(s) Administered  . Influenza Split 11/13/2011  .  Influenza-Unspecified 02/13/2008, 11/30/2013  . PPD Test 12/15/2015  . Pneumococcal-Unspecified 02/13/2008  . Td 02/12/2009    Social History  Substance Use Topics  . Smoking status: Former Smoker    Packs/day: 0.50    Types: Cigarettes  . Smokeless tobacco: Never Used  . Alcohol use No    Review of Systems  DATA OBTAINED: from patient-participates in limited capacity, nurse GENERAL:  no fevers, fatigue, appetite changes SKIN: No itching, rash HEENT: No complaint RESPIRATORY: No cough, wheezing, SOB CARDIAC: No chest pain, palpitations, lower extremity edema  GI: No abdominal pain, No N/V/D or constipation, No heartburn or reflux  GU: No dysuria, frequency or urgency, or incontinence  MUSCULOSKELETAL: No unrelieved bone/joint pain NEUROLOGIC: No headache, dizziness  PSYCHIATRIC: No overt anxiety or sadness  Vitals:   07/10/16 1439  BP: (!) 144/85  Pulse: 73  Resp: 18  Temp: 97.5 F (36.4 C)   Body mass index is 19.07 kg/m. Physical Exam  GENERAL APPEARANCE: Alert, conversant, No acute distress  SKIN: No diaphoresis rash HEENT: Unremarkable RESPIRATORY: Breathing is even,  unlabored. Lung sounds are clear   CARDIOVASCULAR: Heart RRR no murmurs, rubs or gallops. No peripheral edema  GASTROINTESTINAL: Abdomen is soft, non-tender, not distended w/ normal bowel sounds.  GENITOURINARY: Bladder non tender, not distended  MUSCULOSKELETAL: No abnormal joints or musculature NEUROLOGIC: Cranial nerves 2-12 grossly intact. Moves all extremities PSYCHIATRIC: Mood and affectwith dementia, no behavioral issues  Patient Active Problem List   Diagnosis Date Noted  . Vitamin D deficiency 08/25/2016  . Dementia without behavioral disturbance 03/25/2016  . S/P ORIF (open reduction internal fixation) fracture 01/08/2016  . Oral thrush 01/08/2016  . HTN (hypertension) 01/08/2016  . Closed hip fracture requiring operative repair, right, with routine healing, subsequent encounter 12/19/2015  . Bipolar disease, chronic ([place]) 12/17/2015  . Decubitus ulcer of hip, stage 3 ([place]) 12/11/2015  . Ankle pain   . Encephalopathy   . Malnutrition of moderate degree 01/29/2015  . Altered mental status 01/26/2015  . Nausea and vomiting 01/17/2015  . Esophageal dysmotilities   . Urinary tract infectious disease   . Weakness 01/14/2015  . Schatzki's ring 01/14/2015  . CKD (chronic kidney disease), stage III 01/14/2015  . Esophagus disorder   . Dysphagia 01/11/2015  . UTI (lower urinary tract infection) 03/30/2014  . Hearing loss, sensorineural 03/04/2014  . Nasal lesion 03/04/2014  . Unspecified constipation 12/12/2012  . Hypokalemia 12/11/2012  . Orthostatic hypotension 12/11/2012  . Arterial tortuosity (aorta) 12/11/2012  . Protein-calorie malnutrition, severe ([place]) 12/10/2012  . Decreased rectal sphincter tone 11/21/2012  . Idiopathic scoliosis 07/08/2012  . Macular degeneration 06/26/2011  . Vision disturbance 06/26/2011  . Glaucoma 06/26/2011  . Anemia of chronic disease 04/17/2011  . Drug-seeking behavior 02/26/2011  . Osteoporosis 02/26/2011  . Depression   . Anxiety     . COPD (chronic obstructive pulmonary disease) ([place])   . GERD (gastroesophageal reflux disease)   . Migraines   . Insomnia, unspecified     CMP     Component Value Date/Time   NA 145 06/11/2016   K 4.6 06/11/2016   CL 107 12/29/2015 0515   CO2 21 (L) 12/29/2015 0515   GLUCOSE 114 (H) 12/29/2015 0515   BUN 20 06/11/2016   CREATININE 0.6 06/11/2016   CREATININE 0.55 12/29/2015 0515   CREATININE 0.85 11/09/2013 1135   CALCIUM 8.5 (L) 12/29/2015 0515   PROT 6.4 (L) 12/19/2015 1717   ALBUMIN 2.9 (L) 12/19/2015 1717   AST 16 06/11/2016  ALT 9 06/11/2016   ALKPHOS 89 06/11/2016   BILITOT 0.6 12/19/2015 1717   GFRNONAA >60 12/29/2015 0515   GFRNONAA 64 11/09/2013 1135   GFRAA >60 12/29/2015 0515   GFRAA 74 11/09/2013 1135    Recent Labs  11/23/15 1631  12/15/15 0344  12/27/15 0346  12/28/15 0431 12/29/15 12/29/15 0515 06/11/16  NA 144  < > 142  < > 142  < > 139 139 139 145  K 4.6  < > 3.9  < > 3.5  < > 3.2*  --  3.8 4.6  CL 110  < > 111  < > 110  --  106  --  107  --   CO2 27  < > 23  < > 23  --  23  --  21*  --   GLUCOSE 126*  < > 95  < > 106*  --  110*  --  114*  --   BUN 28*  < > 15  < > 6  < > 12 10 10 20   CREATININE 0.93  < > 0.74  < > 0.50  < > 0.60 0.6 0.55 0.6  CALCIUM 9.0  < > 8.9  < > 8.4*  --  8.3*  --  8.5*  --   MG 2.1  --  1.5*  --   --   --   --   --   --   --   < > = values in this interval not displayed.  Recent Labs  12/12/15 0442 12/13/15 0439 12/19/15 1717 06/11/16  AST 20 21 61* 16  ALT 9* 10* 46 9  ALKPHOS 58 61 52 89  BILITOT 0.6 0.9 0.6  --   PROT 5.3* 5.5* 6.4*  --   ALBUMIN 2.5* 2.7* 2.9*  --     Recent Labs  12/13/15 12/13/15 0439  12/19/15 1717  12/25/15 0358  12/26/15 0416 12/27/15 12/27/15 0346 05/22/16  WBC 9.0 9.0  < > 7.8  < > 8.2  < > 5.7 5.7 5.7 9.6  NEUTROABS 7 6.9  --  5.4  --   --   --   --   --   --   --   HGB 10.8* 10.8*  < > 10.9*  < > 8.4*  < > 8.3*  --  8.4* 12.0  HCT 34* 33.6*  < > 35.2*  < > 27.3*  < >  25.9*  --  27.1* 40  MCV  --  93.3  < > 93.9  < > 91.0  --  89.0  --  88.6  --   PLT 215 215  < > 285  < > 331  < > 357  --  406* 235  < > = values in this interval not displayed.  Recent Labs  06/11/16  CHOL 190  LDLCALC 116  TRIG 101   No results found for: [facility] Lab Results  Component Value Date   TSH 2.61 06/11/2016   Lab Results  Component Value Date   HGBA1C 5.4 06/11/2016   Lab Results  Component Value Date   CHOL 190 06/11/2016   HDL 54 06/11/2016   LDLCALC 116 06/11/2016   TRIG 101 06/11/2016    Significant Diagnostic Results in last 30 days:  No results found.  Assessment and Plan  Vitamin D deficiency Vitamin D level 2 months ago was 4, which is good; continue vitamin D 3 1000 units by mouth daily  CKD (chronic kidney  disease), stage III Neuro essentially GFR; creatinine is 0.6 most recent which is stable from prior; we will monitor intervals  HTN (hypertension) Controlled for age; continue Norvasc 10 mg by mouth daily and Lopressor 12.5 mg by mouth twice a day      Taylor Delaine. [name], MD

## 2016-07-30 DIAGNOSIS — F29 Unspecified psychosis not due to a substance or known physiological condition: Secondary | ICD-10-CM | POA: Diagnosis not present

## 2016-07-30 DIAGNOSIS — F39 Unspecified mood [affective] disorder: Secondary | ICD-10-CM | POA: Diagnosis not present

## 2016-07-30 DIAGNOSIS — F411 Generalized anxiety disorder: Secondary | ICD-10-CM | POA: Diagnosis not present

## 2016-07-30 DIAGNOSIS — F0390 Unspecified dementia without behavioral disturbance: Secondary | ICD-10-CM | POA: Diagnosis not present

## 2016-08-01 DIAGNOSIS — R488 Other symbolic dysfunctions: Secondary | ICD-10-CM | POA: Diagnosis not present

## 2016-08-01 DIAGNOSIS — R1312 Dysphagia, oropharyngeal phase: Secondary | ICD-10-CM | POA: Diagnosis not present

## 2016-08-01 DIAGNOSIS — J449 Chronic obstructive pulmonary disease, unspecified: Secondary | ICD-10-CM | POA: Diagnosis not present

## 2016-08-01 DIAGNOSIS — M6281 Muscle weakness (generalized): Secondary | ICD-10-CM | POA: Diagnosis not present

## 2016-08-03 DIAGNOSIS — M6281 Muscle weakness (generalized): Secondary | ICD-10-CM | POA: Diagnosis not present

## 2016-08-03 DIAGNOSIS — R1312 Dysphagia, oropharyngeal phase: Secondary | ICD-10-CM | POA: Diagnosis not present

## 2016-08-03 DIAGNOSIS — J449 Chronic obstructive pulmonary disease, unspecified: Secondary | ICD-10-CM | POA: Diagnosis not present

## 2016-08-03 DIAGNOSIS — R488 Other symbolic dysfunctions: Secondary | ICD-10-CM | POA: Diagnosis not present

## 2016-08-07 DIAGNOSIS — M6281 Muscle weakness (generalized): Secondary | ICD-10-CM | POA: Diagnosis not present

## 2016-08-07 DIAGNOSIS — R1312 Dysphagia, oropharyngeal phase: Secondary | ICD-10-CM | POA: Diagnosis not present

## 2016-08-07 DIAGNOSIS — R488 Other symbolic dysfunctions: Secondary | ICD-10-CM | POA: Diagnosis not present

## 2016-08-07 DIAGNOSIS — J449 Chronic obstructive pulmonary disease, unspecified: Secondary | ICD-10-CM | POA: Diagnosis not present

## 2016-08-09 ENCOUNTER — Encounter: Payer: Self-pay | Admitting: Internal Medicine

## 2016-08-09 ENCOUNTER — Non-Acute Institutional Stay (SKILLED_NURSING_FACILITY): Payer: Medicare Other | Admitting: Internal Medicine

## 2016-08-09 DIAGNOSIS — F0280 Dementia in other diseases classified elsewhere without behavioral disturbance: Secondary | ICD-10-CM | POA: Diagnosis not present

## 2016-08-09 DIAGNOSIS — K219 Gastro-esophageal reflux disease without esophagitis: Secondary | ICD-10-CM | POA: Diagnosis not present

## 2016-08-09 DIAGNOSIS — J449 Chronic obstructive pulmonary disease, unspecified: Secondary | ICD-10-CM | POA: Diagnosis not present

## 2016-08-09 DIAGNOSIS — G301 Alzheimer's disease with late onset: Secondary | ICD-10-CM | POA: Diagnosis not present

## 2016-08-09 NOTE — Progress Notes (Signed)
Location:  [place] Room Number: 497W Place of Service:  SNF [phone])  [name], MD  Patient Care Team: [name], MD as PCP - General (Internal Medicine) [name], MD (Gastroenterology) [name], NP (Obstetrics and Gynecology)  Extended Emergency Contact Information Primary Emergency Contact: [name],[name] Address: [place], [place] Phone: [phone] Mobile Phone: [phone] Relation: Daughter Secondary Emergency Contact: [name] Phone: [phone] Mobile Phone: [phone] Relation: Daughter    Allergies: Macrobid [nitrofurantoin macrocrystal]; Azithromycin; Doxycycline; Escitalopram oxalate; Fioricet [butalbital-apap-caffeine]; Latex; Levofloxacin; Nsaids; Oxycodone; Restasis [cyclosporine]; Sulfa antibiotics; and Biaxin [clarithromycin]  Chief Complaint  Patient presents with  . Medical Management of Chronic Issues    routine visit    HPI: Patient is 83 y.o. female who Is being seen for routine issues of COPD, GERD, and dementia.  Past Medical History:  Diagnosis Date  . Anxiety   . Arterial tortuosity (aorta) 12/11/2012  . Asthma   . Chronic kidney disease, stage IV (severe) ([place]) 12/11/2012  . Chronic mental illness   . CKD (chronic kidney disease), stage III 01/14/2015  . Closed hip fracture requiring operative repair, right, with routine healing, subsequent encounter 12/19/2015  . Colitis   . COPD (chronic obstructive pulmonary disease) ([place])   . Decreased rectal sphincter tone 11/21/2012  . Depression   . Drug-seeking behavior 02/26/2011   Use of multiple md to obtain benzos/narcotics pt must sign contract to receive controlled meds.   Marland Kitchen Dysphagia 01/11/2015  . GERD (gastroesophageal reflux disease)   . Glaucoma 06/26/2011  . Glaucoma of both eyes   . Hearing loss, sensorineural 03/04/2014   [facility]NT; 02/26/14. There is not a medical or surgical treatment that would benefit her type of hearing loss. We discussed amplification in an overview fashion.    Marland Kitchen History of fall   . Insomnia, unspecified   . Macular degeneration 06/26/2011  . Migraines   . Nasal bone fracture 03/30/2014  . Osteoporosis   . Osteoporosis 02/26/2011  . Presence of right artificial hip joint   . Protein-calorie malnutrition, severe ([place]) 12/10/2012  . S/P ORIF (open reduction internal fixation) fracture 01/08/2016  . Schatzki's ring 01/14/2015  . Scoliosis   . Substance abuse    Benbzos and Narcotics, she does not get any of those medicines from [name], we have empathetically told her that.  . Vertigo   . Vitamin B deficiency   . Vitamin D deficiency 08/25/2016  . Weight loss     Past Surgical History:  Procedure Laterality Date  . ABDOMINAL HYSTERECTOMY    . CATARACT EXTRACTION    . CHOLECYSTECTOMY    . ESOPHAGOGASTRODUODENOSCOPY (EGD) WITH PROPOFOL N/A 01/13/2015   Procedure: ESOPHAGOGASTRODUODENOSCOPY (EGD) WITH PROPOFOL;  Surgeon: [name], MD;  [facility];  Service: Endoscopy;  Laterality: N/A;  . HIP ARTHROPLASTY Right 12/21/2015   Procedure: ARTHROPLASTY BIPOLAR HIP (HEMIARTHROPLASTY);  Surgeon: [name], MD;  Location: [place];  Service: Orthopedics;  Laterality: Right;  . PARTIAL HYSTERECTOMY      Allergies as of 08/09/2016      Reactions   Macrobid [nitrofurantoin Macrocrystal] Rash   Azithromycin Other (See Comments)   unknown   Doxycycline Other (See Comments)   unknown   Escitalopram Oxalate Other (See Comments)   unknown   Fioricet [butalbital-apap-caffeine] Other (See Comments)   Drunk.  Latex Other (See Comments)   unknown   Levofloxacin Other (See Comments)   unknown   Nsaids Other (See Comments)   This is not an allergy. The patient was told by Dr. Rockne Menghini to avoid NSAIDs and stop Vicoprofen in to 2014 to avoid nephrotoxicity.  Allergic reaction  not listed on MAR.   Oxycodone Other (See Comments)   reaction to synthetic codeine   Restasis [cyclosporine] Other (See Comments)   unknown   Sulfa Antibiotics Other (See Comments)   Reaction unknown   Biaxin [clarithromycin] Rash   With burning sensation      Medication List       Accurate as of 08/09/16 11:59 PM. Always use your most recent med list.          acetaminophen 325 MG tablet Commonly known as:  TYLENOL Take 650 mg by mouth every 6 (six) hours as needed for headache.   albuterol 108 (90 Base) MCG/ACT inhaler Commonly known as:  PROVENTIL HFA;VENTOLIN HFA Inhale 1 puff into the lungs every 6 (six) hours as needed for shortness of breath.   amLODipine 10 MG tablet Commonly known as:  NORVASC Take 10 mg by mouth daily.   ASPERCREME 10 % Lotn Generic drug:  Trolamine Salicylate Apply 1 application topically 2 (two) times daily as needed (for painful areas).   Biotin 5000 MCG Caps Take 1 capsule by mouth daily.   clonazePAM 0.5 MG tablet Commonly known as:  KLONOPIN Take 0.5 mg by mouth 2 (two) times daily.   Cranberry 425 MG Caps Take 425 mg by mouth 2 (two) times daily.   docusate sodium 100 MG capsule Commonly known as:  COLACE Take 100 mg by mouth 2 (two) times daily.   dorzolamide-timolol 22.3-6.8 MG/ML ophthalmic solution Commonly known as:  COSOPT Place 1 drop into both eyes 2 (two) times daily.   feeding supplement Liqd Take 1 Container by mouth 2 (two) times daily between meals.   NUTRITIONAL SUPPLEMENT Liqd Initiate 8 oz of Med Pass by mouth 2 times daily to support weight status   fludrocortisone 0.1 MG tablet Commonly known as:  FLORINEF Take 0.1 mg by mouth every morning.   HYDROcodone-acetaminophen 5-325 MG tablet Commonly known as:  NORCO/VICODIN Take 1 tablet by mouth 3 (three) times daily. At 9 am , 1 pm , and 9 pm for pain   lactulose 10 GM/15ML solution Commonly known as:  CHRONULAC Take 15 mLs (10 g total) by mouth daily  as needed for severe constipation.   lidocaine 5 % Commonly known as:  LIDODERM Place 1 patch onto the skin every 12 (twelve) hours as needed (for pain). Remove & Discard patch within 12 hours or as directed by MD   metoprolol tartrate 25 MG tablet Commonly known as:  LOPRESSOR Take 12.5 mg by mouth 2 (two) times daily.   nitroGLYCERIN 0.4 MG SL tablet Commonly known as:  NITROSTAT Place 0.4 mg under the tongue every 5 (five) minutes as needed for chest pain. May use 3 times   pantoprazole 40 MG tablet Commonly known as:  PROTONIX Take 1 tablet (40 mg total) by mouth daily at 6 (six) AM.   polyethylene glycol packet Commonly known as:  MIRALAX / GLYCOLAX Take 17 g by mouth daily.   QUEtiapine 200 MG tablet Commonly known as:  SEROQUEL Take 200 mg by mouth at bedtime.   ranitidine 150 MG tablet Commonly known as:  ZANTAC Take 150 mg by mouth 2 (two) times daily as needed  for heartburn.   senna 8.6 MG tablet Commonly known as:  SENOKOT Take 2 tablets by mouth 2 (two) times daily as needed for constipation.   travoprost (benzalkonium) 0.004 % ophthalmic solution Commonly known as:  TRAVATAN Place 1 drop into both eyes at bedtime.   Vitamin D 1000 units capsule Take 1,000 Units by mouth daily with breakfast.       No orders of the defined types were placed in this encounter.   Immunization History  Administered Date(s) Administered  . Influenza Split 11/13/2011  . Influenza-Unspecified 02/13/2008, 11/30/2013  . PPD Test 12/15/2015  . Pneumococcal-Unspecified 02/13/2008  . Td 02/12/2009    Social History  Substance Use Topics  . Smoking status: Former Smoker    Packs/day: 0.50    Types: Cigarettes  . Smokeless tobacco: Never Used  . Alcohol use No    Review of Systems  DATA OBTAINED: from patient-participates in very limited capacity; nurse-no new concerns GENERAL:  no fevers, fatigue, appetite changes SKIN: No itching, rash HEENT: No  complaint RESPIRATORY: No cough, wheezing, SOB CARDIAC: No chest pain, palpitations, lower extremity edema  GI: No abdominal pain, No N/V/D or constipation, No heartburn or reflux  GU: No dysuria, frequency or urgency, or incontinence  MUSCULOSKELETAL: No unrelieved bone/joint pain NEUROLOGIC: No headache, dizziness  PSYCHIATRIC: No overt anxiety or sadness  Vitals:   08/09/16 1320  BP: 101/64  Pulse: 72  Resp: 18  Temp: 97 F (36.1 C)   Body mass index is 20.16 kg/m. Physical Exam  GENERAL APPEARANCE: Alert,  No acute distress  SKIN: No diaphoresis rash HEENT: Unremarkable RESPIRATORY: Breathing is even, unlabored. Lung sounds are clear   CARDIOVASCULAR: Heart RRR no murmurs, rubs or gallops. No peripheral edema  GASTROINTESTINAL: Abdomen is soft, non-tender, not distended w/ normal bowel sounds.  GENITOURINARY: Bladder non tender, not distended  MUSCULOSKELETAL: No abnormal joints or musculature NEUROLOGIC: Cranial nerves 2-12 grossly intact. Moves all extremities PSYCHIATRIC: Mood and affectWith dementia, no behavioral issues  Patient Active Problem List   Diagnosis Date Noted  . Vitamin D deficiency 08/25/2016  . Dementia without behavioral disturbance 03/25/2016  . S/P ORIF (open reduction internal fixation) fracture 01/08/2016  . Oral thrush 01/08/2016  . HTN (hypertension) 01/08/2016  . Closed hip fracture requiring operative repair, right, with routine healing, subsequent encounter 12/19/2015  . Bipolar disease, chronic ([place]) 12/17/2015  . Decubitus ulcer of hip, stage 3 ([place]) 12/11/2015  . Ankle pain   . Encephalopathy   . Malnutrition of moderate degree 01/29/2015  . Altered mental status 01/26/2015  . Nausea and vomiting 01/17/2015  . Esophageal dysmotilities   . Urinary tract infectious disease   . Weakness 01/14/2015  . Schatzki's ring 01/14/2015  . CKD (chronic kidney disease), stage III 01/14/2015  . Esophagus disorder   . Dysphagia 01/11/2015  .  UTI (lower urinary tract infection) 03/30/2014  . Hearing loss, sensorineural 03/04/2014  . Nasal lesion 03/04/2014  . Unspecified constipation 12/12/2012  . Hypokalemia 12/11/2012  . Orthostatic hypotension 12/11/2012  . Arterial tortuosity (aorta) 12/11/2012  . Protein-calorie malnutrition, severe ([place]) 12/10/2012  . Decreased rectal sphincter tone 11/21/2012  . Idiopathic scoliosis 07/08/2012  . Macular degeneration 06/26/2011  . Vision disturbance 06/26/2011  . Glaucoma 06/26/2011  . Anemia of chronic disease 04/17/2011  . Drug-seeking behavior 02/26/2011  . Osteoporosis 02/26/2011  . Depression   . Anxiety   . COPD (chronic obstructive pulmonary disease) ([place])   . GERD (gastroesophageal reflux disease)   . Migraines   .  Insomnia, unspecified     CMP     Component Value Date/Time   NA 145 06/11/2016   K 4.6 06/11/2016   CL 107 12/29/2015 0515   CO2 21 (L) 12/29/2015 0515   GLUCOSE 114 (H) 12/29/2015 0515   BUN 20 06/11/2016   CREATININE 0.6 06/11/2016   CREATININE 0.55 12/29/2015 0515   CREATININE 0.85 11/09/2013 1135   CALCIUM 8.5 (L) 12/29/2015 0515   PROT 6.4 (L) 12/19/2015 1717   ALBUMIN 2.9 (L) 12/19/2015 1717   AST 16 06/11/2016   ALT 9 06/11/2016   ALKPHOS 89 06/11/2016   BILITOT 0.6 12/19/2015 1717   GFRNONAA >60 12/29/2015 0515   GFRNONAA 64 11/09/2013 1135   GFRAA >60 12/29/2015 0515   GFRAA 74 11/09/2013 1135    Recent Labs  11/23/15 1631  12/15/15 0344  12/27/15 0346  12/28/15 0431 12/29/15 12/29/15 0515 06/11/16  NA 144  < > 142  < > 142  < > 139 139 139 145  K 4.6  < > 3.9  < > 3.5  < > 3.2*  --  3.8 4.6  CL 110  < > 111  < > 110  --  106  --  107  --   CO2 27  < > 23  < > 23  --  23  --  21*  --   GLUCOSE 126*  < > 95  < > 106*  --  110*  --  114*  --   BUN 28*  < > 15  < > 6  < > 12 10 10 20   CREATININE 0.93  < > 0.74  < > 0.50  < > 0.60 0.6 0.55 0.6  CALCIUM 9.0  < > 8.9  < > 8.4*  --  8.3*  --  8.5*  --   MG 2.1  --  1.5*  --   --    --   --   --   --   --   < > = values in this interval not displayed.  Recent Labs  12/12/15 0442 12/13/15 0439 12/19/15 1717 06/11/16  AST 20 21 61* 16  ALT 9* 10* 46 9  ALKPHOS 58 61 52 89  BILITOT 0.6 0.9 0.6  --   PROT 5.3* 5.5* 6.4*  --   ALBUMIN 2.5* 2.7* 2.9*  --     Recent Labs  12/13/15 12/13/15 0439  12/19/15 1717  12/25/15 0358  12/26/15 0416 12/27/15 12/27/15 0346 05/22/16  WBC 9.0 9.0  < > 7.8  < > 8.2  < > 5.7 5.7 5.7 9.6  NEUTROABS 7 6.9  --  5.4  --   --   --   --   --   --   --   HGB 10.8* 10.8*  < > 10.9*  < > 8.4*  < > 8.3*  --  8.4* 12.0  HCT 34* 33.6*  < > 35.2*  < > 27.3*  < > 25.9*  --  27.1* 40  MCV  --  93.3  < > 93.9  < > 91.0  --  89.0  --  88.6  --   PLT 215 215  < > 285  < > 331  < > 357  --  406* 235  < > = values in this interval not displayed.  Recent Labs  06/11/16  CHOL 190  LDLCALC 116  TRIG 101   No results found for: [facility] Lab Results  Component  Value Date   TSH 2.61 06/11/2016   Lab Results  Component Value Date   HGBA1C 5.4 06/11/2016   Lab Results  Component Value Date   CHOL 190 06/11/2016   HDL 54 06/11/2016   LDLCALC 116 06/11/2016   TRIG 101 06/11/2016    Significant Diagnostic Results in last 30 days:  No results found.  Assessment and Plan  COPD (chronic obstructive pulmonary disease) ([place]) Chronic and stable without exacerbation; plan to continue when necessary albuterol  GERD (gastroesophageal reflux disease) No reported episodes of aspiration or reflux; plan to continue Protonix 40 mg by mouth daily and when necessary Zantac  Dementia without behavioral disturbance Chronic and relatively stable; patient has good days and bad days; patient is on no specific medications; plan supportive care    Webb Silversmith D. [name], MD

## 2016-08-22 ENCOUNTER — Non-Acute Institutional Stay (SKILLED_NURSING_FACILITY): Payer: Medicare Other

## 2016-08-22 DIAGNOSIS — Z0000 Encounter for general adult medical examination without abnormal findings: Secondary | ICD-10-CM | POA: Diagnosis not present

## 2016-08-22 NOTE — Progress Notes (Signed)
Subjective:   Taylor Roth is a 83 y.o. female who presents for an Initial Medicare Annual Wellness Visit at [facility]    Objective:    Today's Vitals   08/22/16 0940  BP: 120/60  Pulse: 74  Temp: (!) 97.4 F (36.3 C)  TempSrc: Oral  SpO2: 95%  Weight: 100 lb (45.4 kg)  Height: 4\' 11"  (1.499 m)   Body mass index is 20.2 kg/m.   Current Medications (verified) Outpatient Encounter Prescriptions as of 08/22/2016  Medication Sig  . acetaminophen (TYLENOL) 325 MG tablet Take 650 mg by mouth every 6 (six) hours as needed for headache.  . albuterol (PROVENTIL HFA;VENTOLIN HFA) 108 (90 BASE) MCG/ACT inhaler Inhale 1 puff into the lungs every 6 (six) hours as needed for shortness of breath.   Marland Kitchen amLODipine (NORVASC) 10 MG tablet Take 10 mg by mouth daily.  . Biotin 5000 MCG CAPS Take 1 capsule by mouth daily.  . Cholecalciferol (VITAMIN D) 1000 UNITS capsule Take 1,000 Units by mouth daily with breakfast.   . clonazePAM (KLONOPIN) 0.5 MG tablet Take 0.5 mg by mouth 2 (two) times daily.   . Cranberry 425 MG CAPS Take 425 mg by mouth 2 (two) times daily.   Marland Kitchen docusate sodium (COLACE) 100 MG capsule Take 100 mg by mouth 2 (two) times daily.  . dorzolamide-timolol (COSOPT) 22.3-6.8 MG/ML ophthalmic solution Place 1 drop into both eyes 2 (two) times daily.  . feeding supplement (BOOST / RESOURCE BREEZE) LIQD Take 1 Container by mouth 2 (two) times daily between meals.   . fludrocortisone (FLORINEF) 0.1 MG tablet Take 0.1 mg by mouth every morning.  Marland Kitchen HYDROcodone-acetaminophen (NORCO/VICODIN) 5-325 MG tablet Take 1 tablet by mouth 3 (three) times daily. At 9 am , 1 pm , and 9 pm for pain  . lactulose (CHRONULAC) 10 GM/15ML solution Take 15 mLs (10 g total) by mouth daily as needed for severe constipation.  . lidocaine (LIDODERM) 5 % Place 1 patch onto the skin every 12 (twelve) hours as needed (for pain). Remove & Discard patch within 12 hours or as directed by MD  .  metoprolol tartrate (LOPRESSOR) 25 MG tablet Take 12.5 mg by mouth 2 (two) times daily.   . nitroGLYCERIN (NITROSTAT) 0.4 MG SL tablet Place 0.4 mg under the tongue every 5 (five) minutes as needed for chest pain. May use 3 times  . NUTRITIONAL SUPPLEMENT LIQD Initiate 8 oz of Med Pass by mouth 2 times daily to support weight status  . pantoprazole (PROTONIX) 40 MG tablet Take 1 tablet (40 mg total) by mouth daily at 6 (six) AM.  . polyethylene glycol (MIRALAX / GLYCOLAX) packet Take 17 g by mouth daily.   . QUEtiapine (SEROQUEL) 200 MG tablet Take 200 mg by mouth at bedtime.  . ranitidine (ZANTAC) 150 MG tablet Take 150 mg by mouth 2 (two) times daily as needed for heartburn.  . senna (SENOKOT) 8.6 MG tablet Take 2 tablets by mouth 2 (two) times daily as needed for constipation.  . travoprost, benzalkonium, (TRAVATAN) 0.004 % ophthalmic solution Place 1 drop into both eyes at bedtime.  Taylor Roth Salicylate (ASPERCREME) 10 % LOTN Apply 1 application topically 2 (two) times daily as needed (for painful areas).   No facility-administered encounter medications on file as of 08/22/2016.     Allergies (verified) Macrobid [nitrofurantoin macrocrystal]; Azithromycin; Doxycycline; Escitalopram oxalate; Fioricet [butalbital-apap-caffeine]; Latex; Levofloxacin; Nsaids; Oxycodone; Restasis [cyclosporine]; Sulfa antibiotics; and Biaxin [clarithromycin]   History:  Past Medical History:  Diagnosis Date  . Anxiety   . Arterial tortuosity (aorta) 12/11/2012  . Asthma   . Chronic kidney disease, stage IV (severe) ([place]) 12/11/2012  . Chronic mental illness   . Colitis   . COPD (chronic obstructive pulmonary disease) ([place])   . Depression   . Dysphagia 01/11/2015  . GERD (gastroesophageal reflux disease)   . Glaucoma of both eyes   . History of fall   . Insomnia, unspecified   . Migraines   . Nasal bone fracture 03/30/2014  . Osteoporosis   . Presence of right artificial hip joint   . Schatzki's  ring 01/14/2015  . Scoliosis   . Substance abuse    Benbzos and Narcotics, she does not get any of those medicines from [facility], we have empathetically told her that.  . Vertigo   . Vitamin B deficiency   . Weight loss    Past Surgical History:  Procedure Laterality Date  . ABDOMINAL HYSTERECTOMY    . CATARACT EXTRACTION    . CHOLECYSTECTOMY    . ESOPHAGOGASTRODUODENOSCOPY (EGD) WITH PROPOFOL N/A 01/13/2015   Procedure: ESOPHAGOGASTRODUODENOSCOPY (EGD) WITH PROPOFOL;  Surgeon: [name], MD;  [facility];  Service: Endoscopy;  Laterality: N/A;  . HIP ARTHROPLASTY Right 12/21/2015   Procedure: ARTHROPLASTY BIPOLAR HIP (HEMIARTHROPLASTY);  Surgeon: [name], MD;  Location: [place];  Service: Orthopedics;  Laterality: Right;  . PARTIAL HYSTERECTOMY     Family History  Problem Relation Age of Onset  . Stroke Mother   . Diabetes Mother   . Heart disease Mother   . Hearing loss Mother   . Cancer Father 55       esophageal   Social History   Occupational History  . retired [employer]    Social History Main Topics  . Smoking status: Former Smoker    Packs/day: 0.50    Types: Cigarettes  . Smokeless tobacco: Never Used  . Alcohol use No  . Drug use: No  . Sexual activity: No    Tobacco Counseling Counseling given: Not Answered   Activities of Daily Living In your present state of health, do you have any difficulty performing the following activities: 08/22/2016 12/19/2015  Hearing? [place]? Y -  Difficulty concentrating or making decisions? Y -  Walking or climbing stairs? Y -  Dressing or bathing? Y -  Doing errands, shopping? Taylor Roth  Preparing Food and eating ? Y -  Using the Toilet? Y -  In the past six months, have you accidently leaked urine? Y -  Do you have problems with loss of bowel control? Y -  Managing your Medications? Y -  Managing your Finances? Y -  Housekeeping or managing your Housekeeping? Y -  Some recent data might be  hidden    Immunizations and Health Maintenance Immunization History  Administered Date(s) Administered  . Influenza Split 11/13/2011  . Influenza-Unspecified 02/13/2008, 11/30/2013  . PPD Test 12/15/2015  . Pneumococcal-Unspecified 02/13/2008  . Td 02/12/2009   There are no preventive care reminders to display for this patient.  Patient Care Team: [name], MD as PCP - General (Internal Medicine) [name], MD (Gastroenterology) [name], NP (Obstetrics and Gynecology)  Indicate any recent Medical Services you may have received from other than [facility] providers in the past year (date may be approximate).     Assessment:   This is a routine wellness examination for [facility].   Hearing/Vision screen No  exam data present  Dietary issues and exercise activities discussed: Current Exercise Habits: The patient does not participate in regular exercise at present, Exercise limited by: orthopedic condition(s)  Goals    None     Depression Screen PHQ 2/9 Scores 08/22/2016  PHQ - 2 Score 4  PHQ- 9 Score 8    Fall Risk Fall Risk  08/22/2016  Falls in the past year? Yes  Number falls in past yr: 2 or more  Injury with Fall? Yes    Cognitive Function:     6CIT Screen 08/22/2016  What Year? 4 points  What month? 0 points  What time? 0 points  Count back from 20 0 points  Months in reverse 4 points  Repeat phrase 10 points  Total Score 18    Screening Tests Health Maintenance  Topic Date Due  . INFLUENZA VACCINE  02/12/2018 (Originally 09/12/2016)  . DEXA SCAN  02/12/2018 (Originally 11/22/1995)  . PNA vac Low Risk Adult (2 of 2 - PCV13) 02/12/2018 (Originally 02/12/2009)  . TETANUS/TDAP  02/13/2019      Plan:  I have personally reviewed and addressed the Medicare Annual Wellness questionnaire and have noted the following in the patient's chart:  A. Medical and social history B. Use of alcohol, tobacco or illicit drugs  C. Current medications and  supplements D. Functional ability and status E.  Nutritional status F.  Physical activity G. Advance directives H. List of other physicians I.  Hospitalizations, surgeries, and ER visits in previous 12 months J.  [place] to include hearing, vision, cognitive, depression L. Referrals and appointments - none  In addition, I have reviewed and discussed with patient certain preventive protocols, quality metrics, and best practice recommendations. A written personalized care plan for preventive services as well as general preventive health recommendations were provided to patient.  See attached scanned questionnaire for additional information.   Signed,   Rich Reining, RN Nurse Health Advisor   Quick Notes   Health Maintenance: flu, dexa, pna 13 due     Abnormal Screen: PHQ-8, 6 CIT-18     Patient Concerns: none     Nurse Concerns: none

## 2016-08-22 NOTE — Patient Instructions (Signed)
Taylor Roth , Thank you for taking time to come for your Medicare Wellness Visit. I appreciate your ongoing commitment to your health goals. Please review the following plan we discussed and let me know if I can assist you in the future.   Screening recommendations/referrals: Colonoscopy up to date. Pt over age 81 Mammogram up to date. Pt over ae 75 Bone Density due Recommended yearly ophthalmology/optometry visit for glaucoma screening and checkup Recommended yearly dental visit for hygiene and checkup  Vaccinations: Influenza vaccine due when available Pneumococcal vaccine 13 due Tdap vaccine due 02/13/19 Shingles vaccine not in records  Advanced directives: DNR in chart, need rest for records  Conditions/risks identified: None  Next appointment: Dr. [name] makes rounds   Preventive Care 33 Years and Older, Female Preventive care refers to lifestyle choices and visits with your health care provider that can promote health and wellness. What does preventive care include?  A yearly physical exam. This is also called an annual well check.  Dental exams once or twice a year.  Routine eye exams. Ask your health care provider how often you should have your eyes checked.  Personal lifestyle choices, including:  Daily care of your teeth and gums.  Regular physical activity.  Eating a healthy diet.  Avoiding tobacco and drug use.  Limiting alcohol use.  Practicing safe sex.  Taking low-dose aspirin every day.  Taking vitamin and mineral supplements as recommended by your health care provider. What happens during an annual well check? The services and screenings done by your health care provider during your annual well check will depend on your age, overall health, lifestyle risk factors, and family history of disease. Counseling  Your health care provider may ask you questions about your:  Alcohol use.  Tobacco use.  Drug use.  Emotional well-being.  Home and  relationship well-being.  Sexual activity.  Eating habits.  History of falls.  Memory and ability to understand (cognition).  Work and work Statistician.  Reproductive health. Screening  You may have the following tests or measurements:  Height, weight, and BMI.  Blood pressure.  Lipid and cholesterol levels. These may be checked every 5 years, or more frequently if you are over 20 years old.  Skin check.  Lung cancer screening. You may have this screening every year starting at age 79 if you have a 30-pack-year history of smoking and currently smoke or have quit within the past 15 years.  Fecal occult blood test (FOBT) of the stool. You may have this test every year starting at age 47.  Flexible sigmoidoscopy or colonoscopy. You may have a sigmoidoscopy every 5 years or a colonoscopy every 10 years starting at age 11.  Hepatitis C blood test.  Hepatitis B blood test.  Sexually transmitted disease (STD) testing.  Diabetes screening. This is done by checking your blood sugar (glucose) after you have not eaten for a while (fasting). You may have this done every 1-3 years.  Bone density scan. This is done to screen for osteoporosis. You may have this done starting at age 80.  Mammogram. This may be done every 1-2 years. Talk to your health care provider about how often you should have regular mammograms. Talk with your health care provider about your test results, treatment options, and if necessary, the need for more tests. Vaccines  Your health care provider may recommend certain vaccines, such as:  Influenza vaccine. This is recommended every year.  Tetanus, diphtheria, and acellular pertussis (Tdap, Td) vaccine. You  may need a Td booster every 10 years.  Zoster vaccine. You may need this after age 50.  Pneumococcal 13-valent conjugate (PCV13) vaccine. One dose is recommended after age 56.  Pneumococcal polysaccharide (PPSV23) vaccine. One dose is recommended after  age 33. Talk to your health care provider about which screenings and vaccines you need and how often you need them. This information is not intended to replace advice given to you by your health care provider. Make sure you discuss any questions you have with your health care provider. Document Released: 02/25/2015 Document Revised: 10/19/2015 Document Reviewed: 11/30/2014 Elsevier Interactive Patient Education  2017 [place] Prevention in the Home Falls can cause injuries. They can happen to people of all ages. There are many things you can do to make your home safe and to help prevent falls. What can I do on the outside of my home?  Regularly fix the edges of walkways and driveways and fix any cracks.  Remove anything that might make you trip as you walk through a door, such as a raised step or threshold.  Trim any bushes or trees on the path to your home.  Use bright outdoor lighting.  Clear any walking paths of anything that might make someone trip, such as rocks or tools.  Regularly check to see if handrails are loose or broken. Make sure that both sides of any steps have handrails.  Any raised decks and porches should have guardrails on the edges.  Have any leaves, snow, or ice cleared regularly.  Use sand or salt on walking paths during winter.  Clean up any spills in your garage right away. This includes oil or grease spills. What can I do in the bathroom?  Use night lights.  Install grab bars by the toilet and in the tub and shower. Do not use towel bars as grab bars.  Use non-skid mats or decals in the tub or shower.  If you need to sit down in the shower, use a plastic, non-slip stool.  Keep the floor dry. Clean up any water that spills on the floor as soon as it happens.  Remove soap buildup in the tub or shower regularly.  Attach bath mats securely with double-sided non-slip rug tape.  Do not have throw rugs and other things on the floor that can  make you trip. What can I do in the bedroom?  Use night lights.  Make sure that you have a light by your bed that is easy to reach.  Do not use any sheets or blankets that are too big for your bed. They should not hang down onto the floor.  Have a firm chair that has side arms. You can use this for support while you get dressed.  Do not have throw rugs and other things on the floor that can make you trip. What can I do in the kitchen?  Clean up any spills right away.  Avoid walking on wet floors.  Keep items that you use a lot in easy-to-reach places.  If you need to reach something above you, use a strong step stool that has a grab bar.  Keep electrical cords out of the way.  Do not use floor polish or wax that makes floors slippery. If you must use wax, use non-skid floor wax.  Do not have throw rugs and other things on the floor that can make you trip. What can I do with my stairs?  Do not leave any items  on the stairs.  Make sure that there are handrails on both sides of the stairs and use them. Fix handrails that are broken or loose. Make sure that handrails are as long as the stairways.  Check any carpeting to make sure that it is firmly attached to the stairs. Fix any carpet that is loose or worn.  Avoid having throw rugs at the top or bottom of the stairs. If you do have throw rugs, attach them to the floor with carpet tape.  Make sure that you have a light switch at the top of the stairs and the bottom of the stairs. If you do not have them, ask someone to add them for you. What else can I do to help prevent falls?  Wear shoes that:  Do not have high heels.  Have rubber bottoms.  Are comfortable and fit you well.  Are closed at the toe. Do not wear sandals.  If you use a stepladder:  Make sure that it is fully opened. Do not climb a closed stepladder.  Make sure that both sides of the stepladder are locked into place.  Ask someone to hold it for you,  if possible.  Clearly mark and make sure that you can see:  Any grab bars or handrails.  First and last steps.  Where the edge of each step is.  Use tools that help you move around (mobility aids) if they are needed. These include:  Canes.  Walkers.  Scooters.  Crutches.  Turn on the lights when you go into a dark area. Replace any light bulbs as soon as they burn out.  Set up your furniture so you have a clear path. Avoid moving your furniture around.  If any of your floors are uneven, fix them.  If there are any pets around you, be aware of where they are.  Review your medicines with your doctor. Some medicines can make you feel dizzy. This can increase your chance of falling. Ask your doctor what other things that you can do to help prevent falls. This information is not intended to replace advice given to you by your health care provider. Make sure you discuss any questions you have with your health care provider. Document Released: 11/25/2008 Document Revised: 07/07/2015 Document Reviewed: 03/05/2014 Elsevier Interactive Patient Education  2017 [employer].

## 2016-08-23 DIAGNOSIS — R440 Auditory hallucinations: Secondary | ICD-10-CM | POA: Diagnosis not present

## 2016-08-23 DIAGNOSIS — F39 Unspecified mood [affective] disorder: Secondary | ICD-10-CM | POA: Diagnosis not present

## 2016-08-23 DIAGNOSIS — F0390 Unspecified dementia without behavioral disturbance: Secondary | ICD-10-CM | POA: Diagnosis not present

## 2016-08-23 DIAGNOSIS — F411 Generalized anxiety disorder: Secondary | ICD-10-CM | POA: Diagnosis not present

## 2016-08-24 DIAGNOSIS — R319 Hematuria, unspecified: Secondary | ICD-10-CM | POA: Diagnosis not present

## 2016-08-24 DIAGNOSIS — N390 Urinary tract infection, site not specified: Secondary | ICD-10-CM | POA: Diagnosis not present

## 2016-08-24 DIAGNOSIS — Z79899 Other long term (current) drug therapy: Secondary | ICD-10-CM | POA: Diagnosis not present

## 2016-08-25 ENCOUNTER — Encounter: Payer: Self-pay | Admitting: Internal Medicine

## 2016-08-25 DIAGNOSIS — E559 Vitamin D deficiency, unspecified: Secondary | ICD-10-CM

## 2016-08-25 HISTORY — DX: Vitamin D deficiency, unspecified: E55.9

## 2016-08-25 NOTE — Assessment & Plan Note (Signed)
Controlled for age; continue Norvasc 10 mg by mouth daily and Lopressor 12.5 mg by mouth twice a day

## 2016-08-25 NOTE — Assessment & Plan Note (Signed)
Neuro essentially GFR; creatinine is 0.6 most recent which is stable from prior; we will monitor intervals

## 2016-08-25 NOTE — Assessment & Plan Note (Signed)
Vitamin D level 2 months ago was 52, which is good; continue vitamin D 3 1000 units by mouth daily

## 2016-08-27 ENCOUNTER — Non-Acute Institutional Stay (SKILLED_NURSING_FACILITY): Payer: Medicare Other | Admitting: Internal Medicine

## 2016-08-27 ENCOUNTER — Encounter: Payer: Self-pay | Admitting: Internal Medicine

## 2016-08-27 DIAGNOSIS — B9620 Unspecified Escherichia coli [E. coli] as the cause of diseases classified elsewhere: Secondary | ICD-10-CM

## 2016-08-27 DIAGNOSIS — N390 Urinary tract infection, site not specified: Secondary | ICD-10-CM

## 2016-08-27 NOTE — Progress Notes (Signed)
Location:  [place] Room Number: [place]:  SNF [phone])  [name], MD  Patient Care Team: [name], MD as PCP - General (Internal Medicine) [name], MD (Gastroenterology) [name], NP (Obstetrics and Gynecology)  Extended Emergency Contact Information Primary Emergency Contact: [name],[name] Address: [address], [place] Phone: [phone] Mobile Phone: [phone] Relation: Daughter Secondary Emergency Contact: [name] Phone: [phone] Mobile Phone: [phone] Relation: Daughter    Allergies: Macrobid [nitrofurantoin macrocrystal]; Azithromycin; Doxycycline; Escitalopram oxalate; Fioricet [butalbital-apap-caffeine]; Latex; Levofloxacin; Nsaids; Oxycodone; Restasis [cyclosporine]; Sulfa antibiotics; and Biaxin [clarithromycin]  Chief Complaint  Patient presents with  . Acute Visit    HPI: Patient is 83 y.o. female who Is being seen today for Escherichia coli UTI. She had been ordered for mental status changes and dysuria. MIC returned this past weekend showing greater than 100,000 of Escherichia coli pansensitive. Patient is allergic to multiple antibiotics but was not allergic to ampicillin so she was started on ampicillin 500 mg 3 times a day. When family was told and from the nurse that the patient doesn't allergic to penicillin even though it was not listed on the record. Patient had multiple other allergies listed on the record. I am seeing patient to review her record and to change her antibiotic if needed  Past Medical History:  Diagnosis Date  . Anxiety   . Arterial tortuosity (aorta) 12/11/2012  . Asthma   . Chronic kidney disease, stage IV (severe) ([place]) 12/11/2012  . Chronic mental illness   . CKD (chronic kidney disease), stage III 01/14/2015  . Closed hip fracture requiring operative  repair, right, with routine healing, subsequent encounter 12/19/2015  . Colitis   . COPD (chronic obstructive pulmonary disease) ([place])   . Decreased rectal sphincter tone 11/21/2012  . Depression   . Drug-seeking behavior 02/26/2011   Use of multiple md to obtain benzos/narcotics pt must sign contract to receive controlled meds.   Marland Kitchen Dysphagia 01/11/2015  . GERD (gastroesophageal reflux disease)   . Glaucoma 06/26/2011  . Glaucoma of both eyes   . Hearing loss, sensorineural 03/04/2014   [facility]; 02/26/14. There is not a medical or surgical treatment that would benefit her type of hearing loss. We discussed amplification in an overview fashion.    Marland Kitchen History of fall   . Insomnia, unspecified   . Macular degeneration 06/26/2011  . Migraines   . Nasal bone fracture 03/30/2014  . Osteoporosis   . Osteoporosis 02/26/2011  . Presence of right artificial hip joint   . Protein-calorie malnutrition, severe ([place]) 12/10/2012  . S/P ORIF (open reduction internal fixation) fracture 01/08/2016  . Schatzki's ring 01/14/2015  . Scoliosis   . Substance abuse    Benbzos and Narcotics, she does not get any of those medicines from [facility], we have empathetically told her that.  . Vertigo   . Vitamin B deficiency   . Vitamin D deficiency 08/25/2016  . Weight loss     Past Surgical History:  Procedure Laterality Date  . ABDOMINAL HYSTERECTOMY    . CATARACT EXTRACTION    . CHOLECYSTECTOMY    . ESOPHAGOGASTRODUODENOSCOPY (EGD) WITH PROPOFOL N/A 01/13/2015   Procedure: ESOPHAGOGASTRODUODENOSCOPY (EGD) WITH PROPOFOL;  Surgeon: [name], MD;  Location: [facility] END[facility];  Service: Endoscopy;  Laterality: N/A;  . HIP ARTHROPLASTY Right 12/21/2015   Procedure:  ARTHROPLASTY BIPOLAR HIP (HEMIARTHROPLASTY);  Surgeon: [name], MD;  Location: [facility];  Service: Orthopedics;  Laterality: Right;  . PARTIAL HYSTERECTOMY      Allergies as of 08/27/2016      Reactions   Macrobid [nitrofurantoin  Macrocrystal] Rash   Azithromycin Other (See Comments)   unknown   Doxycycline Other (See Comments)   unknown   Escitalopram Oxalate Other (See Comments)   unknown   Fioricet [butalbital-apap-caffeine] Other (See Comments)   Drunk.   Latex Other (See Comments)   unknown   Levofloxacin Other (See Comments)   unknown   Nsaids Other (See Comments)   This is not an allergy. The patient was told by Dr. Rockne Menghini to avoid NSAIDs and stop Vicoprofen in to 2014 to avoid nephrotoxicity.  Allergic reaction not listed on MAR.   Oxycodone Other (See Comments)   reaction to synthetic codeine   Restasis [cyclosporine] Other (See Comments)   unknown   Sulfa Antibiotics Other (See Comments)   Reaction unknown   Biaxin [clarithromycin] Rash   With burning sensation      Medication List       Accurate as of 08/27/16  3:38 PM. Always use your most recent med list.          acetaminophen 325 MG tablet Commonly known as:  TYLENOL Take 650 mg by mouth every 6 (six) hours as needed for headache.   albuterol 108 (90 Base) MCG/ACT inhaler Commonly known as:  PROVENTIL HFA;VENTOLIN HFA Inhale 1 puff into the lungs every 6 (six) hours as needed for shortness of breath.   amLODipine 10 MG tablet Commonly known as:  NORVASC Take 10 mg by mouth daily.   ASPERCREME 10 % Lotn Generic drug:  Trolamine Salicylate Apply 1 application topically 2 (two) times daily as needed (for painful areas).   Biotin 5000 MCG Caps Take 1 capsule by mouth daily.   clonazePAM 0.5 MG tablet Commonly known as:  KLONOPIN Take 0.5 mg by mouth 2 (two) times daily.   Cranberry 425 MG Caps Take 425 mg by mouth 2 (two) times daily.   docusate sodium 100 MG capsule Commonly known as:  COLACE Take 100 mg by mouth 2 (two) times daily.   dorzolamide-timolol 22.3-6.8 MG/ML ophthalmic solution Commonly known as:  COSOPT Place 1 drop into both eyes 2 (two) times daily.   feeding supplement Liqd Take 1 Container by mouth  2 (two) times daily between meals.   NUTRITIONAL SUPPLEMENT Liqd Initiate 8 oz of Med Pass by mouth 2 times daily to support weight status   fludrocortisone 0.1 MG tablet Commonly known as:  FLORINEF Take 0.1 mg by mouth every morning.   HYDROcodone-acetaminophen 5-325 MG tablet Commonly known as:  NORCO/VICODIN Take 1 tablet by mouth 3 (three) times daily. At 9 am , 1 pm , and 9 pm for pain   lactulose 10 GM/15ML solution Commonly known as:  CHRONULAC Take 15 mLs (10 g total) by mouth daily as needed for severe constipation.   lidocaine 5 % Commonly known as:  LIDODERM Place 1 patch onto the skin every 12 (twelve) hours as needed (for pain). Remove & Discard patch within 12 hours or as directed by MD   metoprolol tartrate 25 MG tablet Commonly known as:  LOPRESSOR Take 12.5 mg by mouth 2 (two) times daily.   nitroGLYCERIN 0.4 MG SL tablet Commonly known as:  NITROSTAT Place 0.4 mg under the tongue every 5 (five) minutes as needed for chest pain.  May use 3 times   pantoprazole 40 MG tablet Commonly known as:  PROTONIX Take 1 tablet (40 mg total) by mouth daily at 6 (six) AM.   polyethylene glycol packet Commonly known as:  MIRALAX / GLYCOLAX Take 17 g by mouth daily.   QUEtiapine 200 MG 24 hr tablet Commonly known as:  SEROQUEL XR Take 200 mg by mouth. Take one tablet with 50 mg to equal 250 mg once daily   SEROQUEL XR 50 MG Tb24 24 hr tablet Generic drug:  QUEtiapine Take 50 mg by mouth. Take one tablet with 200 mg to equal 250 mg daily   ranitidine 150 MG tablet Commonly known as:  ZANTAC Take 150 mg by mouth 2 (two) times daily as needed for heartburn.   senna 8.6 MG tablet Commonly known as:  SENOKOT Take 2 tablets by mouth 2 (two) times daily as needed for constipation.   travoprost (benzalkonium) 0.004 % ophthalmic solution Commonly known as:  TRAVATAN Place 1 drop into both eyes at bedtime.   Vitamin D 1000 units capsule Take 1,000 Units by mouth daily  with breakfast.       Meds ordered this encounter  Medications  . QUEtiapine (SEROQUEL XR) 200 MG 24 hr tablet    Sig: Take 200 mg by mouth. Take one tablet with 50 mg to equal 250 mg once daily  . QUEtiapine (SEROQUEL XR) 50 MG TB24 24 hr tablet    Sig: Take 50 mg by mouth. Take one tablet with 200 mg to equal 250 mg daily    Immunization History  Administered Date(s) Administered  . Influenza Split 11/13/2011  . Influenza-Unspecified 02/13/2008, 11/30/2013  . PPD Test 12/15/2015  . Pneumococcal-Unspecified 02/13/2008  . Td 02/12/2009    Social History  Substance Use Topics  . Smoking status: Former Smoker    Packs/day: 0.50    Types: Cigarettes  . Smokeless tobacco: Never Used  . Alcohol use No    Review of Systems-Unable to obtain secondary to dementia; nursing as per history of present illness    Vitals:   08/27/16 1526  BP: 118/66  Pulse: 71  Resp: 18  Temp: (!) 97.1 F (36.2 C)   Body mass index is 20.8 kg/m. Physical Exam  GENERAL APPEARANCE: Alert,Minimally conversant, No acute distress  SKIN: No diaphoresis rash HEENT: Unremarkable RESPIRATORY: Breathing is even, unlabored. Lung sounds are clear   CARDIOVASCULAR: Heart RRR no murmurs, rubs or gallops. No peripheral edema  GASTROINTESTINAL: Abdomen is soft, non-tender, not distended w/ normal bowel sounds.  GENITOURINARY: Bladder mild tender, not distended  MUSCULOSKELETAL: No abnormal joints or musculature NEUROLOGIC: Cranial nerves 2-12 grossly intact. Moves all extremities PSYCHIATRIC: Mood and affect dementia, no behavioral issues  Patient Active Problem List   Diagnosis Date Noted  . Vitamin D deficiency 08/25/2016  . Dementia without behavioral disturbance 03/25/2016  . S/P ORIF (open reduction internal fixation) fracture 01/08/2016  . Oral thrush 01/08/2016  . HTN (hypertension) 01/08/2016  . Closed hip fracture requiring operative repair, right, with routine healing, subsequent  encounter 12/19/2015  . Bipolar disease, chronic ([place]) 12/17/2015  . Decubitus ulcer of hip, stage 3 ([place]) 12/11/2015  . Ankle pain   . Encephalopathy   . Malnutrition of moderate degree 01/29/2015  . Altered mental status 01/26/2015  . Nausea and vomiting 01/17/2015  . Esophageal dysmotilities   . Urinary tract infectious disease   . Weakness 01/14/2015  . Schatzki's ring 01/14/2015  . CKD (chronic kidney disease), stage III 01/14/2015  .  Esophagus disorder   . Dysphagia 01/11/2015  . UTI (lower urinary tract infection) 03/30/2014  . Hearing loss, sensorineural 03/04/2014  . Nasal lesion 03/04/2014  . Unspecified constipation 12/12/2012  . Hypokalemia 12/11/2012  . Orthostatic hypotension 12/11/2012  . Arterial tortuosity (aorta) 12/11/2012  . Protein-calorie malnutrition, severe ([place]) 12/10/2012  . Decreased rectal sphincter tone 11/21/2012  . Idiopathic scoliosis 07/08/2012  . Macular degeneration 06/26/2011  . Vision disturbance 06/26/2011  . Glaucoma 06/26/2011  . Anemia of chronic disease 04/17/2011  . Drug-seeking behavior 02/26/2011  . Osteoporosis 02/26/2011  . Depression   . Anxiety   . COPD (chronic obstructive pulmonary disease) ([place])   . GERD (gastroesophageal reflux disease)   . Migraines   . Insomnia, unspecified     CMP     Component Value Date/Time   NA 145 06/11/2016   K 4.6 06/11/2016   CL 107 12/29/2015 0515   CO2 21 (L) 12/29/2015 0515   GLUCOSE 114 (H) 12/29/2015 0515   BUN 20 06/11/2016   CREATININE 0.6 06/11/2016   CREATININE 0.55 12/29/2015 0515   CREATININE 0.85 11/09/2013 1135   CALCIUM 8.5 (L) 12/29/2015 0515   PROT 6.4 (L) 12/19/2015 1717   ALBUMIN 2.9 (L) 12/19/2015 1717   AST 16 06/11/2016   ALT 9 06/11/2016   ALKPHOS 89 06/11/2016   BILITOT 0.6 12/19/2015 1717   GFRNONAA >60 12/29/2015 0515   GFRNONAA 64 11/09/2013 1135   GFRAA >60 12/29/2015 0515   GFRAA 74 11/09/2013 1135    Recent Labs  11/23/15 1631  12/15/15 0344   12/27/15 0346  12/28/15 0431 12/29/15 12/29/15 0515 06/11/16  NA 144  < > 142  < > 142  < > 139 139 139 145  K 4.6  < > 3.9  < > 3.5  < > 3.2*  --  3.8 4.6  CL 110  < > 111  < > 110  --  106  --  107  --   CO2 27  < > 23  < > 23  --  23  --  21*  --   GLUCOSE 126*  < > 95  < > 106*  --  110*  --  114*  --   BUN 28*  < > 15  < > 6  < > 12 10 10 20   CREATININE 0.93  < > 0.74  < > 0.50  < > 0.60 0.6 0.55 0.6  CALCIUM 9.0  < > 8.9  < > 8.4*  --  8.3*  --  8.5*  --   MG 2.1  --  1.5*  --   --   --   --   --   --   --   < > = values in this interval not displayed.  Recent Labs  12/12/15 0442 12/13/15 0439 12/19/15 1717 06/11/16  AST 20 21 61* 16  ALT 9* 10* 46 9  ALKPHOS 58 61 52 89  BILITOT 0.6 0.9 0.6  --   PROT 5.3* 5.5* 6.4*  --   ALBUMIN 2.5* 2.7* 2.9*  --     Recent Labs  12/13/15 12/13/15 0439  12/19/15 1717  12/25/15 0358  12/26/15 0416 12/27/15 12/27/15 0346 05/22/16  WBC 9.0 9.0  < > 7.8  < > 8.2  < > 5.7 5.7 5.7 9.6  NEUTROABS 7 6.9  --  5.4  --   --   --   --   --   --   --  HGB 10.8* 10.8*  < > 10.9*  < > 8.4*  < > 8.3*  --  8.4* 12.0  HCT 34* 33.6*  < > 35.2*  < > 27.3*  < > 25.9*  --  27.1* 40  MCV  --  93.3  < > 93.9  < > 91.0  --  89.0  --  88.6  --   PLT 215 215  < > 285  < > 331  < > 357  --  406* 235  < > = values in this interval not displayed.  Recent Labs  06/11/16  CHOL 190  LDLCALC 116  TRIG 101   No results found for: [facility] Lab Results  Component Value Date   TSH 2.61 06/11/2016   Lab Results  Component Value Date   HGBA1C 5.4 06/11/2016   Lab Results  Component Value Date   CHOL 190 06/11/2016   HDL 54 06/11/2016   LDLCALC 116 06/11/2016   TRIG 101 06/11/2016    Significant Diagnostic Results in last 30 days:  No results found.  Assessment and Plan  E COLI UTI-per the record patient is allergic to Macrobid bid levofloxacin and Zithromax and sulfa; there is no allergy to ampicillin however family has said the patient is  allergic to ampicillin. Patient has been on the drug for several days and has had no side effects, however is prudent to change her to reveal anything we can change her to still be giving a by mouth medicine and that is Omnicef 300 mg by mouth every 12 for 7 days. Patient's creatinine clearance calculated was 69; will continue to monitor.   [name], MD

## 2016-08-31 ENCOUNTER — Encounter: Payer: Self-pay | Admitting: Internal Medicine

## 2016-08-31 NOTE — Assessment & Plan Note (Signed)
Chronic and stable without exacerbation; plan to continue when necessary albuterol

## 2016-08-31 NOTE — Assessment & Plan Note (Signed)
No reported episodes of aspiration or reflux; plan to continue Protonix 40 mg by mouth daily and when necessary Zantac

## 2016-08-31 NOTE — Assessment & Plan Note (Signed)
Chronic and relatively stable; patient has good days and bad days; patient is on no specific medications; plan supportive care

## 2016-09-03 ENCOUNTER — Emergency Department (HOSPITAL_COMMUNITY): Payer: Medicare Other

## 2016-09-03 ENCOUNTER — Emergency Department (HOSPITAL_COMMUNITY)
Admission: EM | Admit: 2016-09-03 | Discharge: 2016-09-04 | Disposition: A | Discharge status: Home / Self Care | Payer: Medicare Other | Attending: Emergency Medicine | Admitting: Emergency Medicine

## 2016-09-03 ENCOUNTER — Encounter (HOSPITAL_COMMUNITY): Payer: Self-pay | Admitting: Emergency Medicine

## 2016-09-03 DIAGNOSIS — I129 Hypertensive chronic kidney disease with stage 1 through stage 4 chronic kidney disease, or unspecified chronic kidney disease: Secondary | ICD-10-CM | POA: Diagnosis not present

## 2016-09-03 DIAGNOSIS — R4182 Altered mental status, unspecified: Secondary | ICD-10-CM | POA: Diagnosis not present

## 2016-09-03 DIAGNOSIS — F22 Delusional disorders: Secondary | ICD-10-CM | POA: Diagnosis not present

## 2016-09-03 DIAGNOSIS — Z87891 Personal history of nicotine dependence: Secondary | ICD-10-CM | POA: Diagnosis not present

## 2016-09-03 DIAGNOSIS — F0391 Unspecified dementia with behavioral disturbance: Secondary | ICD-10-CM | POA: Diagnosis not present

## 2016-09-03 DIAGNOSIS — Z79899 Other long term (current) drug therapy: Secondary | ICD-10-CM | POA: Insufficient documentation

## 2016-09-03 DIAGNOSIS — F03C0 Unspecified dementia, severe, without behavioral disturbance, psychotic disturbance, mood disturbance, and anxiety: Secondary | ICD-10-CM | POA: Diagnosis present

## 2016-09-03 DIAGNOSIS — Z818 Family history of other mental and behavioral disorders: Secondary | ICD-10-CM | POA: Diagnosis not present

## 2016-09-03 DIAGNOSIS — F29 Unspecified psychosis not due to a substance or known physiological condition: Secondary | ICD-10-CM | POA: Diagnosis not present

## 2016-09-03 DIAGNOSIS — N184 Chronic kidney disease, stage 4 (severe): Secondary | ICD-10-CM | POA: Insufficient documentation

## 2016-09-03 DIAGNOSIS — Z96641 Presence of right artificial hip joint: Secondary | ICD-10-CM | POA: Insufficient documentation

## 2016-09-03 DIAGNOSIS — R441 Visual hallucinations: Secondary | ICD-10-CM | POA: Diagnosis not present

## 2016-09-03 DIAGNOSIS — Z91040 Latex allergy status: Secondary | ICD-10-CM | POA: Diagnosis not present

## 2016-09-03 DIAGNOSIS — R440 Auditory hallucinations: Secondary | ICD-10-CM | POA: Diagnosis not present

## 2016-09-03 DIAGNOSIS — J45909 Unspecified asthma, uncomplicated: Secondary | ICD-10-CM | POA: Insufficient documentation

## 2016-09-03 DIAGNOSIS — J449 Chronic obstructive pulmonary disease, unspecified: Secondary | ICD-10-CM | POA: Insufficient documentation

## 2016-09-03 DIAGNOSIS — F0390 Unspecified dementia without behavioral disturbance: Secondary | ICD-10-CM | POA: Insufficient documentation

## 2016-09-03 DIAGNOSIS — R443 Hallucinations, unspecified: Secondary | ICD-10-CM | POA: Diagnosis not present

## 2016-09-03 LAB — CBC WITH DIFFERENTIAL/PLATELET
Basophils Absolute: 0 10*3/uL (ref 0.0–0.1)
Basophils Relative: 0 %
Eosinophils Absolute: 0.3 10*3/uL (ref 0.0–0.7)
Eosinophils Relative: 4 %
HEMATOCRIT: 35.6 % — AB (ref 36.0–46.0)
HEMOGLOBIN: 11.3 g/dL — AB (ref 12.0–15.0)
LYMPHS ABS: 2.5 10*3/uL (ref 0.7–4.0)
Lymphocytes Relative: 31 %
MCH: 24.6 pg — AB (ref 26.0–34.0)
MCHC: 31.7 g/dL (ref 30.0–36.0)
MCV: 77.6 fL — ABNORMAL LOW (ref 78.0–100.0)
MONOS PCT: 8 %
Monocytes Absolute: 0.6 10*3/uL (ref 0.1–1.0)
NEUTROS ABS: 4.5 10*3/uL (ref 1.7–7.7)
NEUTROS PCT: 57 %
Platelets: 320 10*3/uL (ref 150–400)
RBC: 4.59 MIL/uL (ref 3.87–5.11)
RDW: 15.4 % (ref 11.5–15.5)
WBC: 8 10*3/uL (ref 4.0–10.5)

## 2016-09-03 LAB — URINALYSIS, COMPLETE (UACMP) WITH MICROSCOPIC
BACTERIA UA: NONE SEEN
BILIRUBIN URINE: NEGATIVE
Glucose, UA: NEGATIVE mg/dL
Hgb urine dipstick: NEGATIVE
Ketones, ur: NEGATIVE mg/dL
Leukocytes, UA: NEGATIVE
Nitrite: NEGATIVE
Protein, ur: NEGATIVE mg/dL
SPECIFIC GRAVITY, URINE: 1.005 (ref 1.005–1.030)
SQUAMOUS EPITHELIAL / LPF: NONE SEEN
pH: 7 (ref 5.0–8.0)

## 2016-09-03 LAB — COMPREHENSIVE METABOLIC PANEL
ALBUMIN: 3.7 g/dL (ref 3.5–5.0)
ALT: 12 U/L — AB (ref 14–54)
ANION GAP: 9 (ref 5–15)
AST: 22 U/L (ref 15–41)
Alkaline Phosphatase: 106 U/L (ref 38–126)
BUN: 22 mg/dL — ABNORMAL HIGH (ref 6–20)
CHLORIDE: 105 mmol/L (ref 101–111)
CO2: 25 mmol/L (ref 22–32)
CREATININE: 0.7 mg/dL (ref 0.44–1.00)
Calcium: 9.3 mg/dL (ref 8.9–10.3)
GFR calc non Af Amer: >60 mL/min (ref >60)
GLUCOSE: 117 mg/dL — AB (ref 65–99)
Potassium: 3.9 mmol/L (ref 3.5–5.1)
SODIUM: 139 mmol/L (ref 135–145)
Total Bilirubin: 0.3 mg/dL (ref 0.3–1.2)
Total Protein: 7 g/dL (ref 6.5–8.1)

## 2016-09-03 LAB — AMMONIA: AMMONIA: 23 umol/L (ref 9–35)

## 2016-09-03 LAB — CBG MONITORING, ED: GLUCOSE-CAPILLARY: 94 mg/dL (ref 65–99)

## 2016-09-03 MED ORDER — SODIUM CHLORIDE 0.9 % IV SOLN
INTRAVENOUS | Status: DC
  Administered 2016-09-03: 21:00:00 via INTRAVENOUS
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
## 2016-09-03 NOTE — ED Triage Notes (Signed)
Patient here via EMS from [facility] with complaints of hallucinations. Staff reports hx of psychiatric disorders with hallucinations but worse. Diagnosed with UTI on 7/13 treated with ampicillin.

## 2016-09-04 ENCOUNTER — Non-Acute Institutional Stay (SKILLED_NURSING_FACILITY): Payer: Medicare Other | Admitting: Internal Medicine

## 2016-09-04 ENCOUNTER — Encounter: Payer: Self-pay | Admitting: Internal Medicine

## 2016-09-04 DIAGNOSIS — F22 Delusional disorders: Secondary | ICD-10-CM

## 2016-09-04 DIAGNOSIS — D638 Anemia in other chronic diseases classified elsewhere: Secondary | ICD-10-CM | POA: Diagnosis not present

## 2016-09-04 DIAGNOSIS — Z87891 Personal history of nicotine dependence: Secondary | ICD-10-CM | POA: Diagnosis not present

## 2016-09-04 DIAGNOSIS — R4182 Altered mental status, unspecified: Secondary | ICD-10-CM | POA: Diagnosis not present

## 2016-09-04 DIAGNOSIS — F419 Anxiety disorder, unspecified: Secondary | ICD-10-CM

## 2016-09-04 DIAGNOSIS — Z818 Family history of other mental and behavioral disorders: Secondary | ICD-10-CM

## 2016-09-04 LAB — RAPID URINE DRUG SCREEN, HOSP PERFORMED
AMPHETAMINES: NOT DETECTED
BENZODIAZEPINES: NOT DETECTED
Barbiturates: NOT DETECTED
COCAINE: NOT DETECTED
OPIATES: POSITIVE — AB
TETRAHYDROCANNABINOL: NOT DETECTED

## 2016-09-04 LAB — CBG MONITORING, ED: Glucose-Capillary: 115 mg/dL — ABNORMAL HIGH (ref 65–99)

## 2016-09-04 LAB — SALICYLATE LEVEL: Salicylate Lvl: <7 mg/dL (ref 2.8–30.0)

## 2016-09-04 LAB — ACETAMINOPHEN LEVEL: Acetaminophen (Tylenol), Serum: <10 ug/mL — ABNORMAL LOW (ref 10–30)

## 2016-09-04 LAB — ETHANOL: Alcohol, Ethyl (B): <5 mg/dL (ref <5)

## 2016-09-04 MED ORDER — SENNA 8.6 MG PO TABS: 2.0000 | ORAL_TABLET | Freq: Two times a day (BID) | ORAL | Status: DC | PRN

## 2016-09-04 MED ORDER — ALBUTEROL SULFATE HFA 108 (90 BASE) MCG/ACT IN AERS: 1.0000 | INHALATION_SPRAY | Freq: Four times a day (QID) | RESPIRATORY_TRACT | Status: DC | PRN

## 2016-09-04 MED ORDER — POLYETHYLENE GLYCOL 3350 17 G PO PACK
17.0000 g | PACK | Freq: Every day | ORAL | Status: DC
  Administered 2016-09-04: 17 g via ORAL
  Filled 2016-09-04: qty 1

## 2016-09-04 MED ORDER — CLONAZEPAM 0.5 MG PO TABS
0.5000 mg | ORAL_TABLET | Freq: Two times a day (BID) | ORAL | Status: DC
  Administered 2016-09-04 (×2): 0.5 mg via ORAL
  Filled 2016-09-04 (×2): qty 1

## 2016-09-04 MED ORDER — VITAMIN D 1000 UNITS PO TABS
1000.0000 [IU] | ORAL_TABLET | Freq: Every day | ORAL | Status: DC
  Administered 2016-09-04: 1000 [IU] via ORAL
  Filled 2016-09-04: qty 1

## 2016-09-04 MED ORDER — PANTOPRAZOLE SODIUM 40 MG PO TBEC
40.0000 mg | DELAYED_RELEASE_TABLET | Freq: Every day | ORAL | Status: DC
  Administered 2016-09-04: 40 mg via ORAL
  Filled 2016-09-04: qty 1

## 2016-09-04 MED ORDER — QUETIAPINE FUMARATE ER 200 MG PO TB24
200.0000 mg | ORAL_TABLET | Freq: Every day | ORAL | Status: DC
  Administered 2016-09-04: 200 mg via ORAL
  Filled 2016-09-04: qty 1

## 2016-09-04 MED ORDER — HYDROCODONE-ACETAMINOPHEN 5-325 MG PO TABS
1.0000 | ORAL_TABLET | Freq: Three times a day (TID) | ORAL | Status: DC
  Administered 2016-09-04: 1 via ORAL
  Filled 2016-09-04: qty 1

## 2016-09-04 MED ORDER — LACTULOSE 10 GM/15ML PO SOLN
10.0000 g | Freq: Every day | ORAL | Status: DC | PRN
  Filled 2016-09-04: qty 15

## 2016-09-04 MED ORDER — TRAVOPROST 0.004 % OP SOLN: 1.0000 [drp] | Freq: Every day | OPHTHALMIC | Status: DC

## 2016-09-04 MED ORDER — TROLAMINE SALICYLATE 10 % EX LOTN: 1.0000 "application " | TOPICAL_LOTION | Freq: Two times a day (BID) | CUTANEOUS | Status: DC | PRN

## 2016-09-04 MED ORDER — BIOTIN 5000 MCG PO CAPS
1.0000 | ORAL_CAPSULE | Freq: Every day | ORAL | Status: DC
Start: 2016-09-04 — End: 2016-09-04

## 2016-09-04 MED ORDER — METOPROLOL TARTRATE 25 MG PO TABS
12.5000 mg | ORAL_TABLET | Freq: Two times a day (BID) | ORAL | Status: DC
  Administered 2016-09-04 (×2): 12.5 mg via ORAL
  Filled 2016-09-04 (×2): qty 1

## 2016-09-04 MED ORDER — DOCUSATE SODIUM 100 MG PO CAPS
100.0000 mg | ORAL_CAPSULE | Freq: Two times a day (BID) | ORAL | Status: DC
  Administered 2016-09-04 (×2): 100 mg via ORAL
  Filled 2016-09-04 (×2): qty 1

## 2016-09-04 MED ORDER — ACETAMINOPHEN 325 MG PO TABS
650.0000 mg | ORAL_TABLET | Freq: Four times a day (QID) | ORAL | Status: DC | PRN
Start: 2016-09-04 — End: 2016-09-04

## 2016-09-04 MED ORDER — BOOST / RESOURCE BREEZE PO LIQD
1.0000 | Freq: Two times a day (BID) | ORAL | Status: DC
  Administered 2016-09-04: 1 via ORAL
  Filled 2016-09-04 (×2): qty 1

## 2016-09-04 MED ORDER — DORZOLAMIDE HCL-TIMOLOL MAL 2-0.5 % OP SOLN
1.0000 [drp] | Freq: Two times a day (BID) | OPHTHALMIC | Status: DC
  Administered 2016-09-04: 1 [drp] via OPHTHALMIC

## 2016-09-04 MED ORDER — FLUDROCORTISONE ACETATE 0.1 MG PO TABS
0.1000 mg | ORAL_TABLET | Freq: Every morning | ORAL | Status: DC
  Administered 2016-09-04: 0.1 mg via ORAL
  Filled 2016-09-04: qty 1

## 2016-09-04 MED ORDER — QUETIAPINE FUMARATE ER 50 MG PO TB24
50.0000 mg | ORAL_TABLET | Freq: Every day | ORAL | Status: DC
  Administered 2016-09-04: 50 mg via ORAL
  Filled 2016-09-04: qty 1

## 2016-09-04 MED ORDER — CRANBERRY 425 MG PO CAPS: 425.0000 mg | ORAL_CAPSULE | Freq: Two times a day (BID) | ORAL | Status: DC

## 2016-09-04 MED ORDER — AMLODIPINE BESYLATE 5 MG PO TABS
10.0000 mg | ORAL_TABLET | Freq: Every day | ORAL | Status: DC
  Administered 2016-09-04: 10 mg via ORAL
  Filled 2016-09-04: qty 2
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
## 2016-09-04 NOTE — ED Notes (Addendum)
Pt stated "I don't do eye drops unless I want 'em and I don't want 'em."

## 2016-09-04 NOTE — Discharge Instructions (Signed)
Paranoia °Paranoia is a general mistrust of others. People with paranoia may feel as though people are "out to get them" and the world is against them without reason. They may be afraid of others or behave in an angry or hostile way toward others. °Follow these instructions at home: °Monitor your thoughts and mood for any changes. Take these steps to help with your condition: °· Take over-the-counter and prescription medicines only as told by your health care provider. °· Check with your health care provider before taking any herbs or supplements. °· Keep all follow-up visits as told by your health care provider. This is important. °· Maintain a healthy lifestyle: °? Eat a healthy diet. °? Exercise regularly. °? Get plenty of sleep. °· Avoid alcohol and drugs. °· Learn ways to reduce stress and cope with stress, such as with yoga and meditation. °· Talk about your feelings with family members or health care providers. °· Make time for yourself to do things that you enjoy. ° °Contact a health care provider if: °· Medicines do not seem to be helping. °· You feel extremely fearful and suspicious that something will harm you. °· You feel hopeless and overwhelmed. °· You feel like you cannot leave your house. °· You have trouble taking care of yourself. °Get help right away if: °· You have serious thoughts about hurting yourself or others. °This information is not intended to replace advice given to you by your health care provider. Make sure you discuss any questions you have with your health care provider. °Document Released: 02/01/2003 Document Revised: 07/07/2015 Document Reviewed: 12/06/2013 °Elsevier Interactive Patient Education © 2018 [employer] ° °

## 2016-09-04 NOTE — ED Notes (Addendum)
Pt is alert, oriented to month & stated "I live at [facility].  Is somebody going to be here all night?"

## 2016-09-04 NOTE — BHH Suicide Risk Assessment (Signed)
Suicide Risk Assessment  Discharge Assessment   [facility] Discharge Suicide Risk Assessment   Principal Problem: Dementia without behavioral disturbance Discharge Diagnoses:  Patient Active Problem List   Diagnosis Date Noted  . Dementia without behavioral disturbance [F03.90] 03/25/2016    Priority: High  . Vitamin D deficiency [E55.9] 08/25/2016  . S/P ORIF (open reduction internal fixation) fracture [Z96.7, Z87.81] 01/08/2016  . Oral thrush [B37.0] 01/08/2016  . HTN (hypertension) [I10] 01/08/2016  . Closed hip fracture requiring operative repair, right, with routine healing, subsequent encounter [S72.001D] 12/19/2015  . Bipolar disease, chronic ([place]) [F31.9] 12/17/2015  . Decubitus ulcer of hip, stage 3 ([place]) [L89.203] 12/11/2015  . Ankle pain [M25.579]   . Encephalopathy [G93.40]   . Malnutrition of moderate degree [E44.0] 01/29/2015  . Altered mental status [R41.82] 01/26/2015  . Nausea and vomiting [R11.2] 01/17/2015  . Esophageal dysmotilities [K22.4]   . Urinary tract infectious disease [N39.0]   . Weakness [R53.1] 01/14/2015  . Schatzki's ring [K22.2] 01/14/2015  . CKD (chronic kidney disease), stage III [N18.3] 01/14/2015  . Esophagus disorder [K22.9]   . Dysphagia [R13.10] 01/11/2015  . UTI (lower urinary tract infection) [N39.0] 03/30/2014  . Hearing loss, sensorineural [H90.5] 03/04/2014  . Nasal lesion [J34.89] 03/04/2014  . Unspecified constipation [K59.00] 12/12/2012  . Hypokalemia [E87.6] 12/11/2012  . Orthostatic hypotension [I95.1] 12/11/2012  . Arterial tortuosity (aorta) [I77.1] 12/11/2012  . Protein-calorie malnutrition, severe ([place]) [E43] 12/10/2012  . Decreased rectal sphincter tone [K62.89] 11/21/2012  . Idiopathic scoliosis [M41.20] 07/08/2012  . Macular degeneration [H35.30] 06/26/2011  . Vision disturbance [H53.9] 06/26/2011  . Glaucoma [H40.9] 06/26/2011  . Anemia of chronic disease [D63.8] 04/17/2011  . Drug-seeking behavior [Z76.5] 02/26/2011  .  Osteoporosis [M81.0] 02/26/2011  . Depression [F32.9]   . Anxiety [F41.9]   . COPD (chronic obstructive pulmonary disease) ([place]) [J44.9]   . GERD (gastroesophageal reflux disease) [K21.9]   . Migraines [G43.909]   . Insomnia, unspecified [G47.00]     Total Time spent with patient: 30 minutes  Musculoskeletal: Strength & Muscle Tone: within normal limits Gait & Station: normal Patient leans: N/A  Psychiatric Specialty Exam:   Blood pressure 106/75, pulse 78, temperature 98.7 F (37.1 C), resp. rate 18, height 5' (1.524 m), weight 45.8 kg (101 lb), SpO2 98 %.Body mass index is 19.73 kg/m. General Appearance: Casual Eye Contact::  Fair Speech:  Slow and UTA dementia Volume:  Normal Mood:  Depressed and Dysphoric Affect:  Congruent and Depressed Thought Process:  NA Orientation:  Other:  UTA Pt with dementia Thought Content:  UTA dementia Suicidal Thoughts:  UTA dementia Homicidal Thoughts:  UTA dementia Memory:  UTA dementia Judgement:  Other:  UTA dementia Insight:  UTA dementia Psychomotor Activity:  Decreased Concentration:  UTA dementia Recall:  UTA dementia Fund of Knowledge:UTA dementia Language: Good Akathisia:  No Handed:  Right AIMS (if indicated):    Assets:  Agricultural consultant Housing Social Support Sleep:    Cognition: Impaired,  Moderate ADL's:  Intact   Mental Status Per Nursing Assessment::   On Admission:     Demographic Factors:  Age 83 or older  Loss Factors: Decline in physical health  Historical Factors: NA  Risk Reduction Factors:   Living with another person, especially a relative  Continued Clinical Symptoms:  Depression:   Anhedonia Chronic Pain  Cognitive Features That Contribute To Risk:  None    Suicide Risk:  Minimal: No identifiable suicidal ideation.  Patients presenting with no risk factors but with  morbid ruminations; may be classified as minimal risk based on the severity of the  depressive symptoms    Plan Of Care/Follow-up recommendations:  Activity:  as tolerated Diet:  heart healthy  Ethelene Hal, NP 09/04/2016, 8:26 AM

## 2016-09-04 NOTE — ED Notes (Signed)
Pt has dentures in a pink dental cup ini the room and the facility DNR in the chart folder.

## 2016-09-04 NOTE — Progress Notes (Signed)
PHARMACIST - PHYSICIAN ORDER COMMUNICATION  CONCERNING: P&T Medication Policy on Herbal Medications  DESCRIPTION:  This patient's order for:  Biotin and cranberry caps  has been noted.  This product(s) is classified as an "herbal" or natural product. Due to a lack of definitive safety studies or FDA approval, nonstandard manufacturing practices, plus the potential risk of unknown drug-drug interactions while on inpatient medications, the Pharmacy and Therapeutics Committee does not permit the use of "herbal" or natural products of this type within [facility].   ACTION TAKEN: The pharmacy department is unable to verify this order at this time and your patient has been informed of this safety policy. Please reevaluate patient's clinical condition at discharge and address if the herbal or natural product(s) should be resumed at that time. Eudelia Bunch, Pharm.D. [phone] 09/04/2016 1:58 AM

## 2016-09-04 NOTE — ED Provider Notes (Signed)
Care assumed from [facility], PA-C, at shift change, please see their notes for full documentation of patient's complaint/HPI. Briefly, pt here with delusions/hallucinations/paranoid behaviors per nursing home staff, so they sent her here for further psychiatric evaluation. Results so far show U/A reassuring (recent UTI, treated), CBG WNL, CMP unremarkable, CBC unremarkable except baseline anemia, ammonia neg, CXR neg, CT head neg. Awaiting TTS consult, and added on salicylate level, acetaminophen level, UDS, and EtOH levels. Plan is to await TTS consult recommendations.   Physical Exam  BP (!) 152/79 (BP Location: Right Arm)   Pulse 74   Temp 98.4 F (36.9 C) (Oral)   Resp 18   Ht 5' (1.524 m)   Wt 45.8 kg (101 lb)   SpO2 95%   BMI 19.73 kg/m   Physical Exam Gen: afebrile, VSS, NAD HEENT: EOMI, MMM Resp: no resp distress, CTAB CV: rate WNL, RRR, nl s1/s2, no m/r/g, distal pulses intact  Abd: appearance normal, Soft, NTND, +BS throughout, no r/g/r MsK: moving all extremities with ease    ED Course  Procedures Results for orders placed or performed during the hospital encounter of 09/03/16  Comprehensive metabolic panel  Result Value Ref Range   Sodium 139 135 - 145 mmol/L   Potassium 3.9 3.5 - 5.1 mmol/L   Chloride 105 101 - 111 mmol/L   CO2 25 22 - 32 mmol/L   Glucose, Bld 117 (H) 65 - 99 mg/dL   BUN 22 (H) 6 - 20 mg/dL   Creatinine, Ser 0.70 0.44 - 1.00 mg/dL   Calcium 9.3 8.9 - 10.3 mg/dL   Total Protein 7.0 6.5 - 8.1 g/dL   Albumin 3.7 3.5 - 5.0 g/dL   AST 22 15 - 41 U/L   ALT 12 (L) 14 - 54 U/L   Alkaline Phosphatase 106 38 - 126 U/L   Total Bilirubin 0.3 0.3 - 1.2 mg/dL   GFR calc non Af Amer >60 >60 mL/min   GFR calc Af Amer >60 >60 mL/min   Anion gap 9 5 - 15  CBC WITH DIFFERENTIAL  Result Value Ref Range   WBC 8.0 4.0 - 10.5 K/uL   RBC 4.59 3.87 - 5.11 MIL/uL   Hemoglobin 11.3 (L) 12.0 - 15.0 g/dL   HCT 35.6 (L) 36.0 - 46.0 %   MCV 77.6 (L) 78.0 - 100.0 fL    MCH 24.6 (L) 26.0 - 34.0 pg   MCHC 31.7 30.0 - 36.0 g/dL   RDW 15.4 11.5 - 15.5 %   Platelets 320 150 - 400 K/uL   Neutrophils Relative % 57 %   Neutro Abs 4.5 1.7 - 7.7 K/uL   Lymphocytes Relative 31 %   Lymphs Abs 2.5 0.7 - 4.0 K/uL   Monocytes Relative 8 %   Monocytes Absolute 0.6 0.1 - 1.0 K/uL   Eosinophils Relative 4 %   Eosinophils Absolute 0.3 0.0 - 0.7 K/uL   Basophils Relative 0 %   Basophils Absolute 0.0 0.0 - 0.1 K/uL  Urinalysis, Complete w Microscopic  Result Value Ref Range   Color, Urine STRAW (A) YELLOW   APPearance CLEAR CLEAR   Specific Gravity, Urine 1.005 1.005 - 1.030   pH 7.0 5.0 - 8.0   Glucose, UA NEGATIVE NEGATIVE mg/dL   Hgb urine dipstick NEGATIVE NEGATIVE   Bilirubin Urine NEGATIVE NEGATIVE   Ketones, ur NEGATIVE NEGATIVE mg/dL   Protein, ur NEGATIVE NEGATIVE mg/dL   Nitrite NEGATIVE NEGATIVE   Leukocytes, UA NEGATIVE NEGATIVE   RBC /  HPF 0-5 0 - 5 RBC/hpf   WBC, UA 0-5 0 - 5 WBC/hpf   Bacteria, UA NONE SEEN NONE SEEN   Squamous Epithelial / LPF NONE SEEN NONE SEEN  Ammonia  Result Value Ref Range   Ammonia 23 9 - 35 umol/L  Ethanol  Result Value Ref Range   Alcohol, Ethyl (B) <5 <5 mg/dL  Salicylate level  Result Value Ref Range   Salicylate Lvl <0.1 2.8 - 30.0 mg/dL  Acetaminophen level  Result Value Ref Range   Acetaminophen (Tylenol), Serum <10 (L) 10 - 30 ug/mL  Urine rapid drug screen (hosp performed)  Result Value Ref Range   Opiates POSITIVE (A) NONE DETECTED   Cocaine NONE DETECTED NONE DETECTED   Benzodiazepines NONE DETECTED NONE DETECTED   Amphetamines NONE DETECTED NONE DETECTED   Tetrahydrocannabinol NONE DETECTED NONE DETECTED   Barbiturates NONE DETECTED NONE DETECTED  CBG monitoring, ED  Result Value Ref Range   Glucose-Capillary 94 65 - 99 mg/dL   Dg Chest 2 View  Result Date: 09/03/2016 CLINICAL DATA:  Altered mental status EXAM: CHEST  2 VIEW COMPARISON:  12/22/2015 FINDINGS: Cardiac shadow is within normal  limits. The lungs are well aerated bilaterally. No focal infiltrate or sizable effusion is seen. Mild scoliosis is again seen and stable. IMPRESSION: No acute abnormality noted. Electronically Signed   By: Inez Catalina M.D.   On: 09/03/2016 20:46   Ct Head Wo Contrast  Result Date: 09/03/2016 CLINICAL DATA:  83 year old presenting with hallucinations. EXAM: CT HEAD WITHOUT CONTRAST TECHNIQUE: Contiguous axial images were obtained from the base of the skull through the vertex without intravenous contrast. COMPARISON:  Multiple prior CT head examinations dating back to 08/04/2003, most recently 12/11/2015. FINDINGS: Brain: Moderate age related cortical, deep and cerebellar atrophy, unchanged. Severe changes of small vessel disease of the white matter diffusely, unchanged. No mass lesion. No midline shift. No acute hemorrhage or hematoma. No extra-axial fluid collections. No evidence of acute infarction. Vascular: Moderate bilateral carotid siphon atherosclerosis. Mild bilateral vertebral artery atherosclerosis. Skull: No skull fracture or other focal osseous abnormality involving the skull. Sinuses/Orbits: Visualized paranasal sinuses, bilateral mastoid air cells and bilateral middle ear cavities well-aerated. Visualized orbits and globes are normal. Other: None. IMPRESSION: 1. No acute intracranial abnormality. 2. Stable moderate age related generalized atrophy and severe chronic microvascular ischemic changes of the white matter. Electronically Signed   By: Evangeline Dakin M.D.   On: 09/03/2016 20:31     Meds ordered this encounter  Medications  . 0.9 %  sodium chloride infusion  . DISCONTD: cefdinir (OMNICEF) 300 MG capsule    Sig: Take 300 mg by mouth 2 (two) times daily.  Marland Kitchen acetaminophen (TYLENOL) tablet 650 mg  . albuterol (PROVENTIL HFA;VENTOLIN HFA) 108 (90 Base) MCG/ACT inhaler 1 puff  . amLODipine (NORVASC) tablet 10 mg  . Biotin CAPS 5,000 mcg  . Vitamin D 1,000 Units  . clonazePAM  (KLONOPIN) tablet 0.5 mg  . Cranberry CAPS 425 mg  . docusate sodium (COLACE) capsule 100 mg  . dorzolamide-timolol (COSOPT) 22.3-6.8 MG/ML ophthalmic solution 1 drop  . feeding supplement (BOOST / RESOURCE BREEZE) liquid 1 Container  . fludrocortisone (FLORINEF) tablet 0.1 mg  . HYDROcodone-acetaminophen (NORCO/VICODIN) 5-325 MG per tablet 1 tablet  . lactulose (CHRONULAC) 10 GM/15ML solution 10 g  . metoprolol tartrate (LOPRESSOR) tablet 12.5 mg  . pantoprazole (PROTONIX) EC tablet 40 mg  . polyethylene glycol (MIRALAX / GLYCOLAX) packet 17 g  . QUEtiapine (SEROQUEL XR) 24  hr tablet 200 mg  . senna (SENOKOT) tablet 17.2 mg  . QUEtiapine (SEROQUEL XR) 24 hr tablet 50 mg  . travoprost (benzalkonium) (TRAVATAN) 0.004 % ophthalmic solution 1 drop  . Trolamine Salicylate 10 % LOTN 1 application     MDM:   [id number]   1. Delusions ([place]) F22   2. Paranoia ([place]) F22     1:54 AM EtOH level undetectable, Salicylate and acetaminophen levels WNL, UDS with +opiates but otherwise WNL. TTS consultation done, and recommending AM psych eval. Pt medically cleared at this time. Psych hold orders and home med orders placed. Please see TTS notes for further documentation of care/dispo. Pt stable at time of med clearance.     [address], [place], [place] 09/04/16 0155    Tegeler, Gwenyth Allegra, MD 09/04/16 [phone]

## 2016-09-04 NOTE — Progress Notes (Addendum)
Report given to nurse at [facility]. Transportation has been called. Patient is stable at discharge to facility. Patient's upper dentures is in patient belongings bag.

## 2016-09-04 NOTE — Progress Notes (Signed)
CSW attempted to contact [employer] regarding patient return. Voicemail left for call back.   Kingsley Spittle, [facility] Emergency Room Clinical Social Worker [phone]

## 2016-09-04 NOTE — Consult Note (Signed)
[facility] Face-to-Face Psychiatry Consult   Reason for Consult:  Delusional episodes Referring Physician:  EDP Patient Identification: Taylor Roth MRN:  [id number] Principal Diagnosis: Dementia without behavioral disturbance Diagnosis:   Patient Active Problem List   Diagnosis Date Noted  . Dementia without behavioral disturbance [F03.90] 03/25/2016    Priority: High  . Vitamin D deficiency [E55.9] 08/25/2016  . S/P ORIF (open reduction internal fixation) fracture [Z96.7, Z87.81] 01/08/2016  . Oral thrush [B37.0] 01/08/2016  . HTN (hypertension) [I10] 01/08/2016  . Closed hip fracture requiring operative repair, right, with routine healing, subsequent encounter [S72.001D] 12/19/2015  . Bipolar disease, chronic (HCC) [F31.9] 12/17/2015  . Decubitus ulcer of hip, stage 3 (HCC) [L89.203] 12/11/2015  . Ankle pain [M25.579]   . Encephalopathy [G93.40]   . Malnutrition of moderate degree [E44.0] 01/29/2015  . Altered mental status [R41.82] 01/26/2015  . Nausea and vomiting [R11.2] 01/17/2015  . Esophageal dysmotilities [K22.4]   . Urinary tract infectious disease [N39.0]   . Weakness [R53.1] 01/14/2015  . Schatzki's ring [K22.2] 01/14/2015  . CKD (chronic kidney disease), stage III [N18.3] 01/14/2015  . Esophagus disorder [K22.9]   . Dysphagia [R13.10] 01/11/2015  . UTI (lower urinary tract infection) [N39.0] 03/30/2014  . Hearing loss, sensorineural [H90.5] 03/04/2014  . Nasal lesion [J34.89] 03/04/2014  . Unspecified constipation [K59.00] 12/12/2012  . Hypokalemia [E87.6] 12/11/2012  . Orthostatic hypotension [I95.1] 12/11/2012  . Arterial tortuosity (aorta) [I77.1] 12/11/2012  . Protein-calorie malnutrition, severe (HCC) [E43] 12/10/2012  . Decreased rectal sphincter tone [K62.89] 11/21/2012  . Idiopathic scoliosis [M41.20] 07/08/2012  . Macular degeneration [H35.30] 06/26/2011  . Vision disturbance [H53.9] 06/26/2011  . Glaucoma [H40.9] 06/26/2011  . Anemia of chronic disease  [D63.8] 04/17/2011  . Drug-seeking behavior [Z76.5] 02/26/2011  . Osteoporosis [M81.0] 02/26/2011  . Depression [F32.9]   . Anxiety [F41.9]   . COPD (chronic obstructive pulmonary disease) (HCC) [J44.9]   . GERD (gastroesophageal reflux disease) [K21.9]   . Migraines [G43.909]   . Insomnia, unspecified [G47.00]     Total Time spent with patient: 30 minutes  Subjective:   Taylor Roth is a 83 y.o. female patient admitted with delusional episodes.  HPI:  Taylor Roth is an 83 year old female who [facility] to the [facility] from [facility] nursing home with reports of delusional episodes.  Pt is legally blind, and hard of hearing and had difficulty participating in the assessment. Pt answered one question and stated she doesn't know why she is at the hospital. Today, Pt is calm and pleasant with no evidence of delusions. Pt is psychiatrically clear for discharge back to [facility] nursing facility.   Past Psychiatric History: Depression, Bipolar  Risk to Self: None Risk to Others: None Prior Inpatient Therapy: Prior Inpatient Therapy: Yes Prior Therapy Dates: UTA Prior Therapy Facilty/Provider(s): UTA Reason for Treatment: UTA Prior Outpatient Therapy: Prior Outpatient Therapy: Yes Prior Therapy Dates: Current Prior Therapy Facilty/Provider(s): Psychiatry at [facility] Reason for Treatment: medication  Does patient have an ACCT team?: No Does patient have Intensive In-House Services?  : No Does patient have [facility] services? : No Does patient have P4CC services?: No  Past Medical History:  Past Medical History:  Diagnosis Date  . Anxiety   . Arterial tortuosity (aorta) 12/11/2012  . Asthma   . Chronic kidney disease, stage IV (severe) (HCC) 12/11/2012  . Chronic mental illness   . CKD (chronic kidney disease), stage III 01/14/2015  . Closed hip fracture requiring operative repair, right, with routine healing,  subsequent encounter 12/19/2015  . Colitis   . COPD (chronic  obstructive pulmonary disease) ([place])   . Decreased rectal sphincter tone 11/21/2012  . Depression   . Drug-seeking behavior 02/26/2011   Use of multiple md to obtain benzos/narcotics pt must sign contract to receive controlled meds.   Marland Kitchen Dysphagia 01/11/2015  . GERD (gastroesophageal reflux disease)   . Glaucoma 06/26/2011  . Glaucoma of both eyes   . Hearing loss, sensorineural 03/04/2014   [facility]; 02/26/14. There is not a medical or surgical treatment that would benefit her type of hearing loss. We discussed amplification in an overview fashion.    Marland Kitchen History of fall   . Insomnia, unspecified   . Macular degeneration 06/26/2011  . Migraines   . Nasal bone fracture 03/30/2014  . Osteoporosis   . Osteoporosis 02/26/2011  . Presence of right artificial hip joint   . Protein-calorie malnutrition, severe ([place]) 12/10/2012  . S/P ORIF (open reduction internal fixation) fracture 01/08/2016  . Schatzki's ring 01/14/2015  . Scoliosis   . Substance abuse    Benbzos and Narcotics, she does not get any of those medicines from [facility], we have empathetically told her that.  . Vertigo   . Vitamin B deficiency   . Vitamin D deficiency 08/25/2016  . Weight loss     Past Surgical History:  Procedure Laterality Date  . ABDOMINAL HYSTERECTOMY    . CATARACT EXTRACTION    . CHOLECYSTECTOMY    . ESOPHAGOGASTRODUODENOSCOPY (EGD) WITH PROPOFOL N/A 01/13/2015   Procedure: ESOPHAGOGASTRODUODENOSCOPY (EGD) WITH PROPOFOL;  Surgeon: [name], MD;  Location: [facility];  Service: Endoscopy;  Laterality: N/A;  . HIP ARTHROPLASTY Right 12/21/2015   Procedure: ARTHROPLASTY BIPOLAR HIP (HEMIARTHROPLASTY);  Surgeon: [name], MD;  Location: [place];  Service: Orthopedics;  Laterality: Right;  . PARTIAL HYSTERECTOMY     Family History:  Family History  Problem Relation Age of Onset  . Stroke Mother   . Diabetes Mother   . Heart disease Mother   . Hearing loss Mother   . Cancer Father 36        esophageal   Family Psychiatric  History: Unknown Social History:  History  Alcohol Use No     History  Drug Use No    Social History   Social History  . Marital status: Divorced    Spouse name: N/A  . Number of children: N/A  . Years of education: N/A   Occupational History  . retired [employer]    Social History Main Topics  . Smoking status: Former Smoker    Packs/day: 0.50    Types: Cigarettes  . Smokeless tobacco: Never Used  . Alcohol use No  . Drug use: No  . Sexual activity: No   Other Topics Concern  . None   Social History Narrative   Admitted to [facility] 12/15/2015   Divorced   Former Smoker   Alcohol none   DNR   Additional Social History:    Allergies:   Allergies  Allergen Reactions  . Macrobid [Nitrofurantoin Macrocrystal] Rash  . Azithromycin Other (See Comments)    unknown  . Doxycycline Other (See Comments)    unknown  . Escitalopram Oxalate Other (See Comments)    unknown  . Fioricet [Butalbital-Apap-Caffeine] Other (See Comments)    Drunk.  . Latex Other (See Comments)    unknown  . Levofloxacin Other (See Comments)    unknown  . Nsaids Other (See Comments)  This is not an allergy. The patient was told by Dr. Rockne Menghini to avoid NSAIDs and stop Vicoprofen in to 2014 to avoid nephrotoxicity.   Allergic reaction not listed on MAR.  Marland Kitchen Oxycodone Other (See Comments)    reaction to synthetic codeine  . Restasis [Cyclosporine] Other (See Comments)    unknown  . Sulfa Antibiotics Other (See Comments)    Reaction unknown  . Biaxin [Clarithromycin] Rash    With burning sensation    Labs:  Results for orders placed or performed during the hospital encounter of 09/03/16 (from the past 48 hour(s))  Comprehensive metabolic panel     Status: Abnormal   Collection Time: 09/03/16  8:05 PM  Result Value Ref Range   Sodium 139 135 - 145 mmol/L   Potassium 3.9 3.5 - 5.1 mmol/L   Chloride 105 101 - 111 mmol/L   CO2 25 22 - 32 mmol/L    Glucose, Bld 117 (H) 65 - 99 mg/dL   BUN 22 (H) 6 - 20 mg/dL   Creatinine, Ser 0.70 0.44 - 1.00 mg/dL   Calcium 9.3 8.9 - 10.3 mg/dL   Total Protein 7.0 6.5 - 8.1 g/dL   Albumin 3.7 3.5 - 5.0 g/dL   AST 22 15 - 41 U/L   ALT 12 (L) 14 - 54 U/L   Alkaline Phosphatase 106 38 - 126 U/L   Total Bilirubin 0.3 0.3 - 1.2 mg/dL   GFR calc non Af Amer >60 >60 mL/min   GFR calc Af Amer >60 >60 mL/min    Comment: (NOTE) The eGFR has been calculated using the CKD EPI equation. This calculation has not been validated in all clinical situations. eGFR's persistently <60 mL/min signify possible Chronic Kidney Disease.    Anion gap 9 5 - 15  CBC WITH DIFFERENTIAL     Status: Abnormal   Collection Time: 09/03/16  8:05 PM  Result Value Ref Range   WBC 8.0 4.0 - 10.5 K/uL   RBC 4.59 3.87 - 5.11 MIL/uL   Hemoglobin 11.3 (L) 12.0 - 15.0 g/dL   HCT 35.6 (L) 36.0 - 46.0 %   MCV 77.6 (L) 78.0 - 100.0 fL   MCH 24.6 (L) 26.0 - 34.0 pg   MCHC 31.7 30.0 - 36.0 g/dL   RDW 15.4 11.5 - 15.5 %   Platelets 320 150 - 400 K/uL   Neutrophils Relative % 57 %   Neutro Abs 4.5 1.7 - 7.7 K/uL   Lymphocytes Relative 31 %   Lymphs Abs 2.5 0.7 - 4.0 K/uL   Monocytes Relative 8 %   Monocytes Absolute 0.6 0.1 - 1.0 K/uL   Eosinophils Relative 4 %   Eosinophils Absolute 0.3 0.0 - 0.7 K/uL   Basophils Relative 0 %   Basophils Absolute 0.0 0.0 - 0.1 K/uL  Ammonia     Status: None   Collection Time: 09/03/16  8:05 PM  Result Value Ref Range   Ammonia 23 9 - 35 umol/L  CBG monitoring, ED     Status: None   Collection Time: 09/03/16  8:42 PM  Result Value Ref Range   Glucose-Capillary 94 65 - 99 mg/dL  Urinalysis, Complete w Microscopic     Status: Abnormal   Collection Time: 09/03/16  9:05 PM  Result Value Ref Range   Color, Urine STRAW (A) YELLOW   APPearance CLEAR CLEAR   Specific Gravity, Urine 1.005 1.005 - 1.030   pH 7.0 5.0 - 8.0   Glucose, UA NEGATIVE NEGATIVE  mg/dL   Hgb urine dipstick NEGATIVE NEGATIVE    Bilirubin Urine NEGATIVE NEGATIVE   Ketones, ur NEGATIVE NEGATIVE mg/dL   Protein, ur NEGATIVE NEGATIVE mg/dL   Nitrite NEGATIVE NEGATIVE   Leukocytes, UA NEGATIVE NEGATIVE   RBC / HPF 0-5 0 - 5 RBC/hpf   WBC, UA 0-5 0 - 5 WBC/hpf   Bacteria, UA NONE SEEN NONE SEEN   Squamous Epithelial / LPF NONE SEEN NONE SEEN  Urine rapid drug screen (hosp performed)     Status: Abnormal   Collection Time: 09/04/16 12:17 AM  Result Value Ref Range   Opiates POSITIVE (A) NONE DETECTED   Cocaine NONE DETECTED NONE DETECTED   Benzodiazepines NONE DETECTED NONE DETECTED   Amphetamines NONE DETECTED NONE DETECTED   Tetrahydrocannabinol NONE DETECTED NONE DETECTED   Barbiturates NONE DETECTED NONE DETECTED    Comment:        DRUG SCREEN FOR MEDICAL PURPOSES ONLY.  IF CONFIRMATION IS NEEDED FOR ANY PURPOSE, NOTIFY LAB WITHIN 5 DAYS.        LOWEST DETECTABLE LIMITS FOR URINE DRUG SCREEN Drug Class       Cutoff (ng/mL) Amphetamine      1000 Barbiturate      200 Benzodiazepine   200 Tricyclics       300 Opiates          300 Cocaine          300 THC              50   Ethanol     Status: None   Collection Time: 09/04/16 12:25 AM  Result Value Ref Range   Alcohol, Ethyl (B) <5 <5 mg/dL    Comment:        LOWEST DETECTABLE LIMIT FOR SERUM ALCOHOL IS 5 mg/dL FOR MEDICAL PURPOSES ONLY   Salicylate level     Status: None   Collection Time: 09/04/16 12:25 AM  Result Value Ref Range   Salicylate Lvl <7.0 2.8 - 30.0 mg/dL  Acetaminophen level     Status: Abnormal   Collection Time: 09/04/16 12:25 AM  Result Value Ref Range   Acetaminophen (Tylenol), Serum <10 (L) 10 - 30 ug/mL    Comment:        THERAPEUTIC CONCENTRATIONS VARY SIGNIFICANTLY. A RANGE OF 10-30 ug/mL MAY BE AN EFFECTIVE CONCENTRATION FOR MANY PATIENTS. HOWEVER, SOME ARE BEST TREATED AT CONCENTRATIONS OUTSIDE THIS RANGE. ACETAMINOPHEN CONCENTRATIONS >150 ug/mL AT 4 HOURS AFTER INGESTION AND >50 ug/mL AT 12 HOURS AFTER  INGESTION ARE OFTEN ASSOCIATED WITH TOXIC REACTIONS.     Current Facility-Administered Medications  Medication Dose Route Frequency Provider Last Rate Last Dose  . 0.9 %  sodium chloride infusion   Intravenous Continuous Jacinto Halim, PA-C   Stopped at 09/03/16 2140  . acetaminophen (TYLENOL) tablet 650 mg  650 mg Oral Q6H PRN Street, [place], [employer]      . albuterol (PROVENTIL HFA;VENTOLIN HFA) 108 (90 Base) MCG/ACT inhaler 1 puff  1 puff Inhalation Q6H PRN Street, [place], [place]      . amLODipine (NORVASC) tablet 10 mg  10 mg Oral Daily [address] [place], [place]      . cholecalciferol (VITAMIN D) tablet 1,000 Units  1,000 Units Oral Q breakfast Street, [place], [place]      . clonazePAM (KLONOPIN) tablet 0.5 mg  0.5 mg Oral BID Street, [place], [place]   0.5 mg at 09/04/16 0247  . docusate sodium (COLACE) capsule 100 mg  100 mg Oral BID Street,  [place], PA-C   100 mg at 09/04/16 0247  . dorzolamide-timolol (COSOPT) 22.3-6.8 MG/ML ophthalmic solution 1 drop  1 drop Both Eyes BID Street, [place], [address]      . feeding supplement (BOOST / RESOURCE BREEZE) liquid 1 Container  1 Container Oral BID BM Street, [place], [place]      . fludrocortisone (FLORINEF) tablet 0.1 mg  0.1 mg Oral q morning - [address], [place], [place]      . HYDROcodone-acetaminophen (NORCO/VICODIN) 5-325 MG per tablet 1 tablet  1 tablet Oral TID [address], [place], PA-C      . lactulose (CHRONULAC) 10 GM/15ML solution 10 g  10 g Oral Daily PRN Street, [place], [place]      . metoprolol tartrate (LOPRESSOR) tablet 12.5 mg  12.5 mg Oral BID [address], [place], PA-C   12.5 mg at 09/04/16 0247  . pantoprazole (PROTONIX) EC tablet 40 mg  40 mg Oral [address] [place], [place]   40 mg at 09/04/16 0636  . polyethylene glycol (MIRALAX / GLYCOLAX) packet 17 g  17 g Oral Daily [address], [place], [place]      . QUEtiapine (SEROQUEL XR) 24 hr tablet 200 mg  200 mg Oral Daily [address], [place], [place]      . QUEtiapine (SEROQUEL XR) 24 hr tablet 50 mg   50 mg Oral Daily [address] [place], PA-C      . senna (SENOKOT) tablet 17.2 mg  2 tablet Oral BID PRN Street, [place], PA-C      . travoprost (benzalkonium) (TRAVATAN) 0.004 % ophthalmic solution 1 drop  1 drop Both Eyes QHS Street, [place], [place]       Current Outpatient Prescriptions  Medication Sig Dispense Refill  . acetaminophen (TYLENOL) 325 MG tablet Take 650 mg by mouth every 6 (six) hours as needed for headache.    . albuterol (PROVENTIL HFA;VENTOLIN HFA) 108 (90 BASE) MCG/ACT inhaler Inhale 1 puff into the lungs every 6 (six) hours as needed for shortness of breath.     Marland Kitchen amLODipine (NORVASC) 10 MG tablet Take 10 mg by mouth daily.    . Biotin 5000 MCG CAPS Take 1 capsule by mouth daily.    . Cholecalciferol (VITAMIN D) 1000 UNITS capsule Take 1,000 Units by mouth daily with breakfast.     . clonazePAM (KLONOPIN) 0.5 MG tablet Take 0.5 mg by mouth 2 (two) times daily.     . Cranberry 425 MG CAPS Take 425 mg by mouth 2 (two) times daily.     Marland Kitchen docusate sodium (COLACE) 100 MG capsule Take 100 mg by mouth 2 (two) times daily.    . dorzolamide-timolol (COSOPT) 22.3-6.8 MG/ML ophthalmic solution Place 1 drop into both eyes 2 (two) times daily.    . feeding supplement (BOOST / RESOURCE BREEZE) LIQD Take 1 Container by mouth 2 (two) times daily between meals.     . fludrocortisone (FLORINEF) 0.1 MG tablet Take 0.1 mg by mouth every morning.    Marland Kitchen HYDROcodone-acetaminophen (NORCO/VICODIN) 5-325 MG tablet Take 1 tablet by mouth 3 (three) times daily. At 9 am , 1 pm , and 9 pm for pain    . lactulose (CHRONULAC) 10 GM/15ML solution Take 15 mLs (10 g total) by mouth daily as needed for severe constipation. 240 mL 0  . lidocaine (LIDODERM) 5 % Place 1 patch onto the skin every 12 (twelve) hours as needed (for pain). Remove & Discard patch within 12 hours or as directed by MD    . metoprolol tartrate (LOPRESSOR) 25 MG tablet  Take 12.5 mg by mouth 2 (two) times daily.     . nitroGLYCERIN (NITROSTAT)  0.4 MG SL tablet Place 0.4 mg under the tongue every 5 (five) minutes as needed for chest pain. May use 3 times    . pantoprazole (PROTONIX) 40 MG tablet Take 1 tablet (40 mg total) by mouth daily at 6 (six) AM. 30 tablet 2  . polyethylene glycol (MIRALAX / GLYCOLAX) packet Take 17 g by mouth daily.     . QUEtiapine (SEROQUEL XR) 200 MG 24 hr tablet Take 200 mg by mouth. Take one tablet with 50 mg to equal 250 mg once daily    . QUEtiapine (SEROQUEL XR) 50 MG TB24 24 hr tablet Take 50 mg by mouth. Take one tablet with 200 mg to equal 250 mg daily    . senna (SENOKOT) 8.6 MG tablet Take 2 tablets by mouth 2 (two) times daily as needed for constipation.    . travoprost, benzalkonium, (TRAVATAN) 0.004 % ophthalmic solution Place 1 drop into both eyes at bedtime.    [name] Salicylate (ASPERCREME) 10 % LOTN Apply 1 application topically 2 (two) times daily as needed (for painful areas).      Musculoskeletal: Strength & Muscle Tone: within normal limits Gait & Station: normal Patient leans: N/A  Psychiatric Specialty Exam: Physical Exam  Constitutional: She appears well-developed.  HENT:  Head: Normocephalic.  Neck: Normal range of motion.  Respiratory: Effort normal.  Musculoskeletal: Normal range of motion.  Skin: Skin is warm and dry.    Review of Systems  Psychiatric/Behavioral: Positive for depression. Negative for hallucinations, memory loss, substance abuse and suicidal ideas. The patient is not nervous/anxious and does not have insomnia.   All other systems reviewed and are negative.   Blood pressure 106/75, pulse 78, temperature 98.7 F (37.1 C), resp. rate 18, height 5' (1.524 m), weight 45.8 kg (101 lb), SpO2 98 %.Body mass index is 19.73 kg/m.  Psychiatric Specialty Exam:   Blood pressure 106/75, pulse 78, temperature 98.7 F (37.1 C), resp. rate 18, height 5' (1.524 m), weight 45.8 kg (101 lb), SpO2 98 %.Body mass index is 19.73 kg/m.  General Appearance: Casual  Eye  Contact::  Fair  Speech:  Slow and UTA dementia  Volume:  Normal  Mood:  Depressed and Dysphoric  Affect:  Congruent and Depressed  Thought Process:  NA  Orientation:  Other:  UTA Pt with dementia  Thought Content:  UTA dementia  Suicidal Thoughts:  UTA dementia  Homicidal Thoughts:  UTA dementia  Memory:  UTA dementia  Judgement:  Other:  UTA dementia  Insight:  UTA dementia  Psychomotor Activity:  Decreased  Concentration:  UTA dementia  Recall:  UTA dementia  Fund of Knowledge:UTA dementia  Language: Good  Akathisia:  No  Handed:  Right  AIMS (if indicated):     Assets:  Agricultural consultant Housing Social Support  Sleep:     Cognition: Impaired,  Moderate  ADL's:  Intact      Treatment Plan Summary: Plan Bipolar Disorder  Follow up with PCP for any new or existing medical concerns Follow up with therapy/psychiatry at [place] facility Take all medications as prescribed Activity as tolerated Heart Healthy Diet  Disposition: No evidence of imminent risk to self or others at present.   Patient does not meet criteria for psychiatric inpatient admission.  Ethelene Hal, NP 09/04/2016 8:37 AM  Patient seen face-to-face for psychiatric evaluation, chart reviewed and case discussed  with the physician extender and developed treatment plan. Reviewed the information documented and agree with the treatment plan. Corena Pilgrim, MD

## 2016-09-04 NOTE — Progress Notes (Signed)
Location:  [facility] Room Number: 782U Place of Service:  SNF [phone])  [name], MD  Patient Care Team: [name], MD as PCP - General (Internal Medicine) [name], MD (Gastroenterology) [name], NP (Obstetrics and Gynecology)  Extended Emergency Contact Information Primary Emergency Contact: [name],[name] Address: [place], [place] Phone: [phone] Mobile Phone: [phone] Relation: Daughter Secondary Emergency Contact: [name] Phone: [phone] Mobile Phone: [phone] Relation: Daughter    Allergies: Macrobid [nitrofurantoin macrocrystal]; Azithromycin; Doxycycline; Escitalopram oxalate; Fioricet [butalbital-apap-caffeine]; Latex; Levofloxacin; Nsaids; Oxycodone; Restasis [cyclosporine]; Sulfa antibiotics; and Biaxin [clarithromycin]  Chief Complaint  Patient presents with  . Medical Management of Chronic Issues    HPI: Patient is 83 y.o. female who Is being seen for routine issues of delusions, anemia, and anxiety.  Past Medical History:  Diagnosis Date  . Anxiety   . Arterial tortuosity (aorta) 12/11/2012  . Asthma   . Chronic kidney disease, stage IV (severe) ([place]) 12/11/2012  . Chronic mental illness   . CKD (chronic kidney disease), stage III 01/14/2015  . Closed hip fracture requiring operative repair, right, with routine healing, subsequent encounter 12/19/2015  . Colitis   . COPD (chronic obstructive pulmonary disease) ([place])   . Decreased rectal sphincter tone 11/21/2012  . Depression   . Drug-seeking behavior 02/26/2011   Use of multiple md to obtain benzos/narcotics pt must sign contract to receive controlled meds.   Marland Kitchen Dysphagia 01/11/2015  . GERD (gastroesophageal reflux disease)   . Glaucoma 06/26/2011  . Glaucoma of both eyes   . Hearing loss, sensorineural 03/04/2014   [facility];  02/26/14. There is not a medical or surgical treatment that would benefit her type of hearing loss. We discussed amplification in an overview fashion.    Marland Kitchen History of fall   . Insomnia, unspecified   . Macular degeneration 06/26/2011  . Migraines   . Nasal bone fracture 03/30/2014  . Osteoporosis   . Osteoporosis 02/26/2011  . Presence of right artificial hip joint   . Protein-calorie malnutrition, severe ([place]) 12/10/2012  . S/P ORIF (open reduction internal fixation) fracture 01/08/2016  . Schatzki's ring 01/14/2015  . Scoliosis   . Substance abuse    Benbzos and Narcotics, she does not get any of those medicines from [facility], we have empathetically told her that.  . Vertigo   . Vitamin B deficiency   . Vitamin D deficiency 08/25/2016  . Weight loss     Past Surgical History:  Procedure Laterality Date  . ABDOMINAL HYSTERECTOMY    . CATARACT EXTRACTION    . CHOLECYSTECTOMY    . ESOPHAGOGASTRODUODENOSCOPY (EGD) WITH PROPOFOL N/A 01/13/2015   Procedure: ESOPHAGOGASTRODUODENOSCOPY (EGD) WITH PROPOFOL;  Surgeon: [name], MD;  Location: [facility];  Service: Endoscopy;  Laterality: N/A;  . HIP ARTHROPLASTY Right 12/21/2015   Procedure: ARTHROPLASTY BIPOLAR HIP (HEMIARTHROPLASTY);  Surgeon: [name], MD;  Location: [facility];  Service: Orthopedics;  Laterality: Right;  . PARTIAL HYSTERECTOMY      Allergies as of 09/04/2016      Reactions   Macrobid [nitrofurantoin Macrocrystal] Rash   Azithromycin Other (See Comments)   unknown   Doxycycline Other (See Comments)   unknown   Escitalopram Oxalate Other (See Comments)   unknown   Fioricet [butalbital-apap-caffeine] Other (See Comments)   Drunk.   Latex Other (See Comments)  unknown   Levofloxacin Other (See Comments)   unknown   Nsaids Other (See Comments)   This is not an allergy. The patient was told by Dr. Rockne Menghini to avoid NSAIDs and stop Vicoprofen in to 2014 to avoid nephrotoxicity.  Allergic reaction not listed on  MAR.   Oxycodone Other (See Comments)   reaction to synthetic codeine   Restasis [cyclosporine] Other (See Comments)   unknown   Sulfa Antibiotics Other (See Comments)   Reaction unknown   Biaxin [clarithromycin] Rash   With burning sensation      Medication List       Accurate as of 09/04/16 11:59 PM. Always use your most recent med list.          acetaminophen 325 MG tablet Commonly known as:  TYLENOL Take 650 mg by mouth every 6 (six) hours as needed for headache.   albuterol 108 (90 Base) MCG/ACT inhaler Commonly known as:  PROVENTIL HFA;VENTOLIN HFA Inhale 1 puff into the lungs every 6 (six) hours as needed for shortness of breath.   amLODipine 10 MG tablet Commonly known as:  NORVASC Take 10 mg by mouth daily.   ASPERCREME 10 % Lotn Generic drug:  Trolamine Salicylate Apply 1 application topically 2 (two) times daily as needed (for painful areas).   Biotin 5000 MCG Caps Take 1 capsule by mouth daily.   clonazePAM 0.5 MG tablet Commonly known as:  KLONOPIN Take 0.5 mg by mouth 2 (two) times daily.   Cranberry 425 MG Caps Take 425 mg by mouth 2 (two) times daily.   docusate sodium 100 MG capsule Commonly known as:  COLACE Take 100 mg by mouth 2 (two) times daily.   dorzolamide-timolol 22.3-6.8 MG/ML ophthalmic solution Commonly known as:  COSOPT Place 1 drop into both eyes 2 (two) times daily.   feeding supplement Liqd Take 1 Container by mouth 2 (two) times daily between meals.   fludrocortisone 0.1 MG tablet Commonly known as:  FLORINEF Take 0.1 mg by mouth every morning.   HYDROcodone-acetaminophen 5-325 MG tablet Commonly known as:  NORCO/VICODIN Take 1 tablet by mouth 3 (three) times daily. At 9 am , 1 pm , and 9 pm for pain   lactulose 10 GM/15ML solution Commonly known as:  CHRONULAC Take 15 mLs (10 g total) by mouth daily as needed for severe constipation.   lidocaine 5 % Commonly known as:  LIDODERM Place 1 patch onto the skin every 12  (twelve) hours as needed (for pain). Remove & Discard patch within 12 hours or as directed by MD   metoprolol tartrate 25 MG tablet Commonly known as:  LOPRESSOR Take 12.5 mg by mouth 2 (two) times daily.   nitroGLYCERIN 0.4 MG SL tablet Commonly known as:  NITROSTAT Place 0.4 mg under the tongue every 5 (five) minutes as needed for chest pain. May use 3 times   pantoprazole 40 MG tablet Commonly known as:  PROTONIX Take 1 tablet (40 mg total) by mouth daily at 6 (six) AM.   polyethylene glycol packet Commonly known as:  MIRALAX / GLYCOLAX Take 17 g by mouth daily.   QUEtiapine 200 MG 24 hr tablet Commonly known as:  SEROQUEL XR Take 200 mg by mouth. Take one tablet with 50 mg to equal 250 mg once daily   SEROQUEL XR 50 MG Tb24 24 hr tablet Generic drug:  QUEtiapine Take 50 mg by mouth. Take one tablet with 200 mg to equal 250 mg daily   senna 8.6 MG  tablet Commonly known as:  SENOKOT Take 2 tablets by mouth 2 (two) times daily as needed for constipation.   travoprost (benzalkonium) 0.004 % ophthalmic solution Commonly known as:  TRAVATAN Place 1 drop into both eyes at bedtime.   Vitamin D 1000 units capsule Take 1,000 Units by mouth daily with breakfast.       No orders of the defined types were placed in this encounter.   Immunization History  Administered Date(s) Administered  . Influenza Split 11/13/2011  . Influenza-Unspecified 02/13/2008, 11/30/2013  . PPD Test 12/15/2015  . Pneumococcal-Unspecified 02/13/2008  . Td 02/12/2009    Social History  Substance Use Topics  . Smoking status: Former Smoker    Packs/day: 0.50    Types: Cigarettes  . Smokeless tobacco: Never Used  . Alcohol use No    Review of Systems  DATA OBTAINED: from patient-continue contribute minimally, nurse-only new concern is a patient has seemed more paranoid recently GENERAL:  no fevers, fatigue, appetite changes SKIN: No itching, rash HEENT: No complaint RESPIRATORY: No  cough, wheezing, SOB CARDIAC: No chest pain, palpitations, lower extremity edema  GI: No abdominal pain, No N/V/D or constipation, No heartburn or reflux  GU: No dysuria, frequency or urgency, or incontinence  MUSCULOSKELETAL: No unrelieved bone/joint pain NEUROLOGIC: No headache, dizziness  PSYCHIATRIC: No overt anxiety or sadness  Vitals:   09/04/16 1442  BP: 118/66  Pulse: 89  Resp: 20  Temp: 98.7 F (37.1 C)   Body mass index is 20.8 kg/m. Physical Exam  GENERAL APPEARANCE: Alert, Minimally conversant, No acute distress  SKIN: No diaphoresis rash HEENT: Unremarkable except patient is blind RESPIRATORY: Breathing is even, unlabored. Lung sounds are clear   CARDIOVASCULAR: Heart RRR no murmurs, rubs or gallops. No peripheral edema  GASTROINTESTINAL: Abdomen is soft, non-tender, not distended w/ normal bowel sounds.  GENITOURINARY: Bladder non tender, not distended  MUSCULOSKELETAL: No abnormal joints or musculature; very thin NEUROLOGIC: Cranial nerves 2-12 grossly intact. Moves all extremities PSYCHIATRIC: Mood and affect strange with dementia, no behavioral issues  Patient Active Problem List   Diagnosis Date Noted  . Delusions ([place])   . Paranoia ([place])   . Vitamin D deficiency 08/25/2016  . Dementia without behavioral disturbance 03/25/2016  . S/P ORIF (open reduction internal fixation) fracture 01/08/2016  . Oral thrush 01/08/2016  . HTN (hypertension) 01/08/2016  . Closed hip fracture requiring operative repair, right, with routine healing, subsequent encounter 12/19/2015  . Bipolar disease, chronic ([place]) 12/17/2015  . Decubitus ulcer of hip, stage 3 ([place]) 12/11/2015  . Ankle pain   . Encephalopathy   . Malnutrition of moderate degree 01/29/2015  . Altered mental status 01/26/2015  . Nausea and vomiting 01/17/2015  . Esophageal dysmotilities   . Urinary tract infectious disease   . Weakness 01/14/2015  . Schatzki's ring 01/14/2015  . CKD (chronic kidney  disease), stage III 01/14/2015  . Esophagus disorder   . Dysphagia 01/11/2015  . UTI (lower urinary tract infection) 03/30/2014  . Hearing loss, sensorineural 03/04/2014  . Nasal lesion 03/04/2014  . Unspecified constipation 12/12/2012  . Hypokalemia 12/11/2012  . Orthostatic hypotension 12/11/2012  . Arterial tortuosity (aorta) 12/11/2012  . Protein-calorie malnutrition, severe ([place]) 12/10/2012  . Decreased rectal sphincter tone 11/21/2012  . Idiopathic scoliosis 07/08/2012  . Macular degeneration 06/26/2011  . Vision disturbance 06/26/2011  . Glaucoma 06/26/2011  . Anemia of chronic disease 04/17/2011  . Drug-seeking behavior 02/26/2011  . Osteoporosis 02/26/2011  . Depression   . Anxiety   .  COPD (chronic obstructive pulmonary disease) ([place])   . GERD (gastroesophageal reflux disease)   . Migraines   . Insomnia, unspecified     CMP     Component Value Date/Time   NA 139 09/03/2016 2005   NA 145 06/11/2016   K 3.9 09/03/2016 2005   CL 105 09/03/2016 2005   CO2 25 09/03/2016 2005   GLUCOSE 117 (H) 09/03/2016 2005   BUN 22 (H) 09/03/2016 2005   BUN 20 06/11/2016   CREATININE 0.70 09/03/2016 2005   CREATININE 0.85 11/09/2013 1135   CALCIUM 9.3 09/03/2016 2005   PROT 7.0 09/03/2016 2005   ALBUMIN 3.7 09/03/2016 2005   AST 22 09/03/2016 2005   ALT 12 (L) 09/03/2016 2005   ALKPHOS 106 09/03/2016 2005   BILITOT 0.3 09/03/2016 2005   GFRNONAA >60 09/03/2016 2005   GFRNONAA 64 11/09/2013 1135   GFRAA >60 09/03/2016 2005   GFRAA 74 11/09/2013 1135    Recent Labs  11/23/15 1631  12/15/15 0344  12/28/15 0431  12/29/15 0515 06/11/16 09/03/16 2005  NA 144  < > 142  < > 139  < > 139 145 139  K 4.6  < > 3.9  < > 3.2*  --  3.8 4.6 3.9  CL 110  < > 111  < > 106  --  107  --  105  CO2 27  < > 23  < > 23  --  21*  --  25  GLUCOSE 126*  < > 95  < > 110*  --  114*  --  117*  BUN 28*  < > 15  < > 12  < > 10 20 22*  CREATININE 0.93  < > 0.74  < > 0.60  < > 0.55 0.6 0.70    CALCIUM 9.0  < > 8.9  < > 8.3*  --  8.5*  --  9.3  MG 2.1  --  1.5*  --   --   --   --   --   --   < > = values in this interval not displayed.  Recent Labs  12/13/15 0439 12/19/15 1717 06/11/16 09/03/16 2005  AST 21 61* 16 22  ALT 10* 46 9 12*  ALKPHOS 61 52 89 106  BILITOT 0.9 0.6  --  0.3  PROT 5.5* 6.4*  --  7.0  ALBUMIN 2.7* 2.9*  --  3.7    Recent Labs  12/13/15 0439  12/19/15 1717  12/26/15 0416  12/27/15 0346 05/22/16 09/03/16 2005  WBC 9.0  < > 7.8  < > 5.7  < > 5.7 9.6 8.0  NEUTROABS 6.9  --  5.4  --   --   --   --   --  4.5  HGB 10.8*  < > 10.9*  < > 8.3*  --  8.4* 12.0 11.3*  HCT 33.6*  < > 35.2*  < > 25.9*  --  27.1* 40 35.6*  MCV 93.3  < > 93.9  < > 89.0  --  88.6  --  77.6*  PLT 215  < > 285  < > 357  --  406* 235 320  < > = values in this interval not displayed.  Recent Labs  06/11/16  CHOL 190  LDLCALC 116  TRIG 101   No results found for: [facility] Lab Results  Component Value Date   TSH 2.61 06/11/2016   Lab Results  Component Value Date   HGBA1C 5.4 06/11/2016  Lab Results  Component Value Date   CHOL 190 06/11/2016   HDL 54 06/11/2016   LDLCALC 116 06/11/2016   TRIG 101 06/11/2016    Significant Diagnostic Results in last 30 days:  Dg Chest 2 View  Result Date: 09/03/2016 CLINICAL DATA:  Altered mental status EXAM: CHEST  2 VIEW COMPARISON:  12/22/2015 FINDINGS: Cardiac shadow is within normal limits. The lungs are well aerated bilaterally. No focal infiltrate or sizable effusion is seen. Mild scoliosis is again seen and stable. IMPRESSION: No acute abnormality noted. Electronically Signed   By: Inez Catalina M.D.   On: 09/03/2016 20:46   Ct Head Wo Contrast  Result Date: 09/03/2016 CLINICAL DATA:  83 year old presenting with hallucinations. EXAM: CT HEAD WITHOUT CONTRAST TECHNIQUE: Contiguous axial images were obtained from the base of the skull through the vertex without intravenous contrast. COMPARISON:  Multiple prior CT head  examinations dating back to 08/04/2003, most recently 12/11/2015. FINDINGS: Brain: Moderate age related cortical, deep and cerebellar atrophy, unchanged. Severe changes of small vessel disease of the white matter diffusely, unchanged. No mass lesion. No midline shift. No acute hemorrhage or hematoma. No extra-axial fluid collections. No evidence of acute infarction. Vascular: Moderate bilateral carotid siphon atherosclerosis. Mild bilateral vertebral artery atherosclerosis. Skull: No skull fracture or other focal osseous abnormality involving the skull. Sinuses/Orbits: Visualized paranasal sinuses, bilateral mastoid air cells and bilateral middle ear cavities well-aerated. Visualized orbits and globes are normal. Other: None. IMPRESSION: 1. No acute intracranial abnormality. 2. Stable moderate age related generalized atrophy and severe chronic microvascular ischemic changes of the white matter. Electronically Signed   By: Evangeline Dakin M.D.   On: 09/03/2016 20:31    Assessment and Plan  Delusions [facility]) Patient was seen yesterday and today in the emergency department where she was given a full medical workup along with CT head and then seen by behavioral health nurse and then psychiatry. All tests were negative and when the psychiatrist saw the patient she denied that anyone was trying to hurt her that she was seeing or hearing anything and he deemed her competent to come back to the skilled nursing facility. Patient with a history of psychosis, delusions and paranoia probably related to her bipolar disease; she does not seem to be any distress today; will continue to monitor; will continue Seroquel ER 250 mg daily  Anemia of chronic disease Most recent hemoglobin is 11.2; I don't see the anemia panel ordered; however her MCV is 77 think is safe to say this is microcytic anemia therefore we'll start patient on iron 325 mg by mouth daily  Anxiety More stable now on clonazepam 0.25 mg by mouth twice a  day; plan to continue's current regimen    Webb Silversmith D. [name], MD

## 2016-09-04 NOTE — ED Notes (Signed)
Pt stated "I can't take my meds whole.  They crush 'em for me."  Pt meds crushed and given in applesauce.

## 2016-09-05 LAB — URINE CULTURE: Culture: NO GROWTH

## 2016-09-07 ENCOUNTER — Telehealth: Payer: Self-pay

## 2016-09-07 NOTE — Telephone Encounter (Signed)
Possible re-admission to facility. This is a patient you were seeing at [facility] F/U is needed if patient was re-admitted to facility upon discharge. Hospital discharge from [facility] on 09/04/16

## 2016-09-13 DIAGNOSIS — J449 Chronic obstructive pulmonary disease, unspecified: Secondary | ICD-10-CM | POA: Diagnosis not present

## 2016-09-13 DIAGNOSIS — R488 Other symbolic dysfunctions: Secondary | ICD-10-CM | POA: Diagnosis not present

## 2016-09-20 DIAGNOSIS — Z780 Asymptomatic menopausal state: Secondary | ICD-10-CM | POA: Diagnosis not present

## 2016-09-20 DIAGNOSIS — Z13820 Encounter for screening for osteoporosis: Secondary | ICD-10-CM | POA: Diagnosis not present

## 2016-09-20 DIAGNOSIS — M810 Age-related osteoporosis without current pathological fracture: Secondary | ICD-10-CM | POA: Diagnosis not present

## 2016-09-20 LAB — HM DEXA SCAN

## 2016-10-01 ENCOUNTER — Encounter: Payer: Self-pay | Admitting: Internal Medicine

## 2016-10-02 ENCOUNTER — Encounter: Payer: Self-pay | Admitting: Internal Medicine

## 2016-10-02 NOTE — Assessment & Plan Note (Signed)
More stable now on clonazepam 0.25 mg by mouth twice a day; plan to continue's current regimen

## 2016-10-02 NOTE — Assessment & Plan Note (Signed)
Most recent hemoglobin is 11.2; I don't see the anemia panel ordered; however her MCV is 77 think is safe to say this is microcytic anemia therefore we'll start patient on iron 325 mg by mouth daily

## 2016-10-02 NOTE — Assessment & Plan Note (Addendum)
Patient was seen yesterday and today in the emergency department where she was given a full medical workup along with CT head and then seen by behavioral health nurse and then psychiatry. All tests were negative and when the psychiatrist saw the patient she denied that anyone was trying to hurt her that she was seeing or hearing anything and he deemed her competent to come back to the skilled nursing facility. Patient with a history of psychosis, delusions and paranoia probably related to her bipolar disease; she does not seem to be any distress today; will continue to monitor; will continue Seroquel ER 250 mg daily

## 2016-10-05 ENCOUNTER — Encounter: Payer: Self-pay | Admitting: Internal Medicine

## 2016-10-05 ENCOUNTER — Non-Acute Institutional Stay (SKILLED_NURSING_FACILITY): Payer: Medicare Other | Admitting: Internal Medicine

## 2016-10-05 DIAGNOSIS — F22 Delusional disorders: Secondary | ICD-10-CM

## 2016-10-05 DIAGNOSIS — H409 Unspecified glaucoma: Secondary | ICD-10-CM

## 2016-10-05 DIAGNOSIS — F319 Bipolar disorder, unspecified: Secondary | ICD-10-CM

## 2016-10-05 NOTE — Progress Notes (Signed)
Location:  [facility] Room Number: 683M Place of Service:  SNF [phone])  [name], MD  Patient Care Team: [name], MD as PCP - General (Internal Medicine) [name], MD (Gastroenterology) [name], NP (Obstetrics and Gynecology)  Extended Emergency Contact Information Primary Emergency Contact: [name],[name] Address: [place], [place] Phone: [phone] Mobile Phone: [phone] Relation: Daughter Secondary Emergency Contact: [name] Phone: [phone] Mobile Phone: [phone] Relation: Daughter    Allergies: Macrobid [nitrofurantoin macrocrystal]; Azithromycin; Doxycycline; Escitalopram oxalate; Fioricet [butalbital-apap-caffeine]; Latex; Levofloxacin; Nsaids; Oxycodone; Restasis [cyclosporine]; Sulfa antibiotics; and Biaxin [clarithromycin]  Chief Complaint  Patient presents with  . Medical Management of Chronic Issues    routine visit    HPI: Patient is 83 y.o. female who Is being seen for routine issues of glaucoma, paranoia, and bipolar disease.  Past Medical History:  Diagnosis Date  . Anxiety   . Arterial tortuosity (aorta) 12/11/2012  . Asthma   . Chronic kidney disease, stage IV (severe) ([place]) 12/11/2012  . Chronic mental illness   . CKD (chronic kidney disease), stage III 01/14/2015  . Closed hip fracture requiring operative repair, right, with routine healing, subsequent encounter 12/19/2015  . Colitis   . COPD (chronic obstructive pulmonary disease) ([place])   . Decreased rectal sphincter tone 11/21/2012  . Depression   . Drug-seeking behavior 02/26/2011   Use of multiple md to obtain benzos/narcotics pt must sign contract to receive controlled meds.   Marland Kitchen Dysphagia 01/11/2015  . GERD (gastroesophageal reflux disease)   . Glaucoma 06/26/2011  . Glaucoma of both eyes   . Hearing loss, sensorineural  03/04/2014   [facility]; 02/26/14. There is not a medical or surgical treatment that would benefit her type of hearing loss. We discussed amplification in an overview fashion.    Marland Kitchen History of fall   . Insomnia, unspecified   . Macular degeneration 06/26/2011  . Migraines   . Nasal bone fracture 03/30/2014  . Osteoporosis   . Osteoporosis 02/26/2011  . Presence of right artificial hip joint   . Protein-calorie malnutrition, severe ([place]) 12/10/2012  . S/P ORIF (open reduction internal fixation) fracture 01/08/2016  . Schatzki's ring 01/14/2015  . Scoliosis   . Substance abuse    Benbzos and Narcotics, she does not get any of those medicines from [facility], we have empathetically told her that.  . Vertigo   . Vitamin B deficiency   . Vitamin D deficiency 08/25/2016  . Weight loss     Past Surgical History:  Procedure Laterality Date  . ABDOMINAL HYSTERECTOMY    . CATARACT EXTRACTION    . CHOLECYSTECTOMY    . ESOPHAGOGASTRODUODENOSCOPY (EGD) WITH PROPOFOL N/A 01/13/2015   Procedure: ESOPHAGOGASTRODUODENOSCOPY (EGD) WITH PROPOFOL;  Surgeon: [name], MD;  Location: [facility];  Service: Endoscopy;  Laterality: N/A;  . HIP ARTHROPLASTY Right 12/21/2015   Procedure: ARTHROPLASTY BIPOLAR HIP (HEMIARTHROPLASTY);  Surgeon: [name], MD;  Location: [facility];  Service: Orthopedics;  Laterality: Right;  . PARTIAL HYSTERECTOMY      Allergies as of 10/05/2016      Reactions   Macrobid [nitrofurantoin Macrocrystal] Rash   Azithromycin Other (See Comments)   unknown   Doxycycline Other (See Comments)   unknown   Escitalopram Oxalate Other (See Comments)   unknown   Fioricet [butalbital-apap-caffeine] Other (See Comments)   Drunk.  Latex Other (See Comments)   unknown   Levofloxacin Other (See Comments)   unknown   Nsaids Other (See Comments)   This is not an allergy. The patient was told by Dr. Rockne Menghini to avoid NSAIDs and stop Vicoprofen in to 2014 to avoid nephrotoxicity.   Allergic reaction not listed on MAR.   Oxycodone Other (See Comments)   reaction to synthetic codeine   Restasis [cyclosporine] Other (See Comments)   unknown   Sulfa Antibiotics Other (See Comments)   Reaction unknown   Biaxin [clarithromycin] Rash   With burning sensation      Medication List       Accurate as of 10/05/16 11:59 PM. Always use your most recent med list.          acetaminophen 325 MG tablet Commonly known as:  TYLENOL Take 650 mg by mouth every 6 (six) hours as needed for headache.   albuterol 108 (90 Base) MCG/ACT inhaler Commonly known as:  PROVENTIL HFA;VENTOLIN HFA Inhale 1 puff into the lungs every 6 (six) hours as needed for shortness of breath.   amLODipine 10 MG tablet Commonly known as:  NORVASC Take 10 mg by mouth daily.   ASPERCREME 10 % Lotn Generic drug:  Trolamine Salicylate Apply 1 application topically 2 (two) times daily as needed (for painful areas).   Biotin 5000 MCG Caps Take 1 capsule by mouth daily.   clonazePAM 0.5 MG tablet Commonly known as:  KLONOPIN Take 0.5 mg by mouth 2 (two) times daily.   Cranberry 425 MG Caps Take 425 mg by mouth 2 (two) times daily.   docusate sodium 100 MG capsule Commonly known as:  COLACE Take 100 mg by mouth 2 (two) times daily.   dorzolamide-timolol 22.3-6.8 MG/ML ophthalmic solution Commonly known as:  COSOPT Place 1 drop into both eyes 2 (two) times daily.   feeding supplement Liqd Take 1 Container by mouth 2 (two) times daily between meals.   fludrocortisone 0.1 MG tablet Commonly known as:  FLORINEF Take 0.1 mg by mouth every morning.   HYDROcodone-acetaminophen 5-325 MG tablet Commonly known as:  NORCO/VICODIN Take 1 tablet by mouth 3 (three) times daily. At 9 am , 1 pm , and 9 pm for pain   lactulose 10 GM/15ML solution Commonly known as:  CHRONULAC Take 15 mLs (10 g total) by mouth daily as needed for severe constipation.   lidocaine 5 % Commonly known as:   LIDODERM Place 1 patch onto the skin every 12 (twelve) hours as needed (for pain). Remove & Discard patch within 12 hours or as directed by MD   metoprolol tartrate 25 MG tablet Commonly known as:  LOPRESSOR Take 12.5 mg by mouth 2 (two) times daily.   nitroGLYCERIN 0.4 MG SL tablet Commonly known as:  NITROSTAT Place 0.4 mg under the tongue every 5 (five) minutes as needed for chest pain. May use 3 times   pantoprazole 40 MG tablet Commonly known as:  PROTONIX Take 1 tablet (40 mg total) by mouth daily at 6 (six) AM.   polyethylene glycol packet Commonly known as:  MIRALAX / GLYCOLAX Take 17 g by mouth daily.   QUEtiapine 200 MG 24 hr tablet Commonly known as:  SEROQUEL XR Take 200 mg by mouth. Take one tablet with 50 mg to equal 250 mg once daily   SEROQUEL XR 50 MG Tb24 24 hr tablet Generic drug:  QUEtiapine Take 50 mg by mouth. Take one tablet with 200 mg to equal 250 mg  daily   ranitidine 150 MG tablet Commonly known as:  ZANTAC Take 150 mg by mouth. Take one tablet two times daily as needed for heartburn   senna 8.6 MG tablet Commonly known as:  SENOKOT Take 2 tablets by mouth 2 (two) times daily as needed for constipation.   travoprost (benzalkonium) 0.004 % ophthalmic solution Commonly known as:  TRAVATAN Place 1 drop into both eyes at bedtime.   Vitamin D 1000 units capsule Take 1,000 Units by mouth daily with breakfast.            Discharge Care Instructions        Start     Ordered   10/05/16 0000  [facility] DEXA SCAN    Comments:  This external order was created through the Results Console.   10/05/16 1109      Meds ordered this encounter  Medications  . ranitidine (ZANTAC) 150 MG tablet    Sig: Take 150 mg by mouth. Take one tablet two times daily as needed for heartburn    Immunization History  Administered Date(s) Administered  . Influenza Split 11/13/2011  . Influenza-Unspecified 02/13/2008, 11/30/2013  . PPD Test 12/15/2015  .  Pneumococcal-Unspecified 02/13/2008  . Td 02/12/2009    Social History  Substance Use Topics  . Smoking status: Former Smoker    Packs/day: 0.50    Types: Cigarettes  . Smokeless tobacco: Never Used  . Alcohol use No    Review of Systems  DATA OBTAINED: from patient-Limited contribution; nursing-no new concerns GENERAL:  no fevers, fatigue, appetite changes SKIN: No itching, rash HEENT: No complaint RESPIRATORY: No cough, wheezing, SOB CARDIAC: No chest pain, palpitations, lower extremity edema  GI: No abdominal pain, No N/V/D or constipation, No heartburn or reflux  GU: No dysuria, frequency or urgency, or incontinence  MUSCULOSKELETAL: No unrelieved bone/joint pain NEUROLOGIC: No headache, dizziness  PSYCHIATRIC: No overt anxiety or sadness  Vitals:   10/05/16 1054  BP: 118/66  Pulse: 89  Resp: 20  Temp: 98.7 F (37.1 C)   Body mass index is 21.57 kg/m. Physical Exam  GENERAL APPEARANCE: Alert,Minimally conversant, No acute distress  SKIN: No diaphoresis rash HEENT: Unremarkable RESPIRATORY: Breathing is even, unlabored. Lung sounds are clear   CARDIOVASCULAR: Heart RRR no murmurs, rubs or gallops. No peripheral edema  GASTROINTESTINAL: Abdomen is soft, non-tender, not distended w/ normal bowel sounds.  GENITOURINARY: Bladder non tender, not distended  MUSCULOSKELETAL: No abnormal joints or musculature NEUROLOGIC: Cranial nerves 2-12 grossly intact. Moves all extremities PSYCHIATRIC: Mood and affect strange, with dementia.  Patient Active Problem List   Diagnosis Date Noted  . Delusions ([place])   . Paranoia ([place])   . Vitamin D deficiency 08/25/2016  . Dementia without behavioral disturbance 03/25/2016  . S/P ORIF (open reduction internal fixation) fracture 01/08/2016  . Oral thrush 01/08/2016  . HTN (hypertension) 01/08/2016  . Closed hip fracture requiring operative repair, right, with routine healing, subsequent encounter 12/19/2015  . Bipolar disease,  chronic ([place]) 12/17/2015  . Decubitus ulcer of hip, stage 3 ([place]) 12/11/2015  . Ankle pain   . Encephalopathy   . Malnutrition of moderate degree 01/29/2015  . Altered mental status 01/26/2015  . Nausea and vomiting 01/17/2015  . Esophageal dysmotilities   . Urinary tract infectious disease   . Weakness 01/14/2015  . Schatzki's ring 01/14/2015  . CKD (chronic kidney disease), stage III 01/14/2015  . Esophagus disorder   . Dysphagia 01/11/2015  . UTI (lower urinary tract infection) 03/30/2014  .  Hearing loss, sensorineural 03/04/2014  . Nasal lesion 03/04/2014  . Unspecified constipation 12/12/2012  . Hypokalemia 12/11/2012  . Orthostatic hypotension 12/11/2012  . Arterial tortuosity (aorta) 12/11/2012  . Protein-calorie malnutrition, severe ([place]) 12/10/2012  . Decreased rectal sphincter tone 11/21/2012  . Idiopathic scoliosis 07/08/2012  . Macular degeneration 06/26/2011  . Vision disturbance 06/26/2011  . Glaucoma 06/26/2011  . Anemia of chronic disease 04/17/2011  . Drug-seeking behavior 02/26/2011  . Osteoporosis 02/26/2011  . Depression   . Anxiety   . COPD (chronic obstructive pulmonary disease) ([place])   . GERD (gastroesophageal reflux disease)   . Migraines   . Insomnia, unspecified     CMP     Component Value Date/Time   NA 139 09/03/2016 2005   NA 145 06/11/2016   K 3.9 09/03/2016 2005   CL 105 09/03/2016 2005   CO2 25 09/03/2016 2005   GLUCOSE 117 (H) 09/03/2016 2005   BUN 22 (H) 09/03/2016 2005   BUN 20 06/11/2016   CREATININE 0.70 09/03/2016 2005   CREATININE 0.85 11/09/2013 1135   CALCIUM 9.3 09/03/2016 2005   PROT 7.0 09/03/2016 2005   ALBUMIN 3.7 09/03/2016 2005   AST 22 09/03/2016 2005   ALT 12 (L) 09/03/2016 2005   ALKPHOS 106 09/03/2016 2005   BILITOT 0.3 09/03/2016 2005   GFRNONAA >60 09/03/2016 2005   GFRNONAA 64 11/09/2013 1135   GFRAA >60 09/03/2016 2005   GFRAA 74 11/09/2013 1135    Recent Labs  11/23/15 1631  12/15/15 0344   12/28/15 0431  12/29/15 0515 06/11/16 09/03/16 2005  NA 144  < > 142  < > 139  < > 139 145 139  K 4.6  < > 3.9  < > 3.2*  --  3.8 4.6 3.9  CL 110  < > 111  < > 106  --  107  --  105  CO2 27  < > 23  < > 23  --  21*  --  25  GLUCOSE 126*  < > 95  < > 110*  --  114*  --  117*  BUN 28*  < > 15  < > 12  < > 10 20 22*  CREATININE 0.93  < > 0.74  < > 0.60  < > 0.55 0.6 0.70  CALCIUM 9.0  < > 8.9  < > 8.3*  --  8.5*  --  9.3  MG 2.1  --  1.5*  --   --   --   --   --   --   < > = values in this interval not displayed.  Recent Labs  12/13/15 0439 12/19/15 1717 06/11/16 09/03/16 2005  AST 21 61* 16 22  ALT 10* 46 9 12*  ALKPHOS 61 52 89 106  BILITOT 0.9 0.6  --  0.3  PROT 5.5* 6.4*  --  7.0  ALBUMIN 2.7* 2.9*  --  3.7    Recent Labs  12/13/15 0439  12/19/15 1717  12/26/15 0416  12/27/15 0346 05/22/16 09/03/16 2005  WBC 9.0  < > 7.8  < > 5.7  < > 5.7 9.6 8.0  NEUTROABS 6.9  --  5.4  --   --   --   --   --  4.5  HGB 10.8*  < > 10.9*  < > 8.3*  --  8.4* 12.0 11.3*  HCT 33.6*  < > 35.2*  < > 25.9*  --  27.1* 40 35.6*  MCV 93.3  < >  93.9  < > 89.0  --  88.6  --  77.6*  PLT 215  < > 285  < > 357  --  406* 235 320  < > = values in this interval not displayed.  Recent Labs  06/11/16  CHOL 190  LDLCALC 116  TRIG 101   No results found for: [facility] Lab Results  Component Value Date   TSH 2.61 06/11/2016   Lab Results  Component Value Date   HGBA1C 5.4 06/11/2016   Lab Results  Component Value Date   CHOL 190 06/11/2016   HDL 54 06/11/2016   LDLCALC 116 06/11/2016   TRIG 101 06/11/2016    Significant Diagnostic Results in last 30 days:  No results found.  Assessment and Plan  Glaucoma Stable and chronic; plan to continue Cosopt 1 drop each eye twice a day and Travatan 1 drop each eye at bedtime  Paranoia ([place]) Continues; plan to continue Seroquel X are 250 mg daily  Bipolar disease, chronic ([facility]) Recently well controlled; Seroquel XR has been increased now  to 250 mg by mouth daily     Jezreel Justiniano D. [name], MD

## 2016-10-11 DIAGNOSIS — F29 Unspecified psychosis not due to a substance or known physiological condition: Secondary | ICD-10-CM | POA: Diagnosis not present

## 2016-10-11 DIAGNOSIS — F0390 Unspecified dementia without behavioral disturbance: Secondary | ICD-10-CM | POA: Diagnosis not present

## 2016-10-11 DIAGNOSIS — F411 Generalized anxiety disorder: Secondary | ICD-10-CM | POA: Diagnosis not present

## 2016-10-11 DIAGNOSIS — R440 Auditory hallucinations: Secondary | ICD-10-CM | POA: Diagnosis not present

## 2016-10-22 DIAGNOSIS — J449 Chronic obstructive pulmonary disease, unspecified: Secondary | ICD-10-CM | POA: Diagnosis not present

## 2016-10-22 DIAGNOSIS — R488 Other symbolic dysfunctions: Secondary | ICD-10-CM | POA: Diagnosis not present

## 2016-10-23 DIAGNOSIS — Z471 Aftercare following joint replacement surgery: Secondary | ICD-10-CM | POA: Diagnosis not present

## 2016-10-23 DIAGNOSIS — Z96641 Presence of right artificial hip joint: Secondary | ICD-10-CM | POA: Diagnosis not present

## 2016-10-24 ENCOUNTER — Non-Acute Institutional Stay (SKILLED_NURSING_FACILITY): Payer: Medicare Other | Admitting: Internal Medicine

## 2016-10-24 ENCOUNTER — Encounter: Payer: Self-pay | Admitting: Internal Medicine

## 2016-10-24 DIAGNOSIS — H04123 Dry eye syndrome of bilateral lacrimal glands: Secondary | ICD-10-CM | POA: Diagnosis not present

## 2016-10-24 DIAGNOSIS — F22 Delusional disorders: Secondary | ICD-10-CM

## 2016-10-24 DIAGNOSIS — H548 Legal blindness, as defined in USA: Secondary | ICD-10-CM | POA: Diagnosis not present

## 2016-10-24 DIAGNOSIS — H401133 Primary open-angle glaucoma, bilateral, severe stage: Secondary | ICD-10-CM | POA: Diagnosis not present

## 2016-10-24 DIAGNOSIS — H31013 Macula scars of posterior pole (postinflammatory) (post-traumatic), bilateral: Secondary | ICD-10-CM | POA: Diagnosis not present

## 2016-10-24 DIAGNOSIS — Z961 Presence of intraocular lens: Secondary | ICD-10-CM | POA: Diagnosis not present

## 2016-10-24 DIAGNOSIS — H3530 Unspecified macular degeneration: Secondary | ICD-10-CM | POA: Diagnosis not present

## 2016-10-24 NOTE — Progress Notes (Signed)
Location:  [facility] Room Number: [place]:  SNF ([phone])  [name], MD  Patient Care Team: [name], MD as PCP - General (Internal Medicine) [name], MD (Gastroenterology) [name], NP (Obstetrics and Gynecology)  Extended Emergency Contact Information Primary Emergency Contact: [name],[name] Address: [place], [place] Phone: [phone] Mobile Phone: [phone] Relation: Daughter Secondary Emergency Contact: [name] Phone: [phone] Mobile Phone: [phone] Relation: Daughter    Allergies: Macrobid [nitrofurantoin macrocrystal]; Azithromycin; Doxycycline; Escitalopram oxalate; Fioricet [butalbital-apap-caffeine]; Latex; Levofloxacin; Nsaids; Oxycodone; Restasis [cyclosporine]; Sulfa antibiotics; and Biaxin [clarithromycin]  Chief Complaint  Patient presents with  . Acute Visit    hallunicnating "worms in eyes"    HPI: Patient is 83 y.o. female who Nursing asked me to see because she has pulled a WORM out of her eye this a.m. She is very upset about this. Patient does have psychosis. Patient will not come down until the doctor has seen her.  Past Medical History:  Diagnosis Date  . Anxiety   . Arterial tortuosity (aorta) 12/11/2012  . Asthma   . Chronic kidney disease, stage IV (severe) ([place]) 12/11/2012  . Chronic mental illness   . CKD (chronic kidney disease), stage III 01/14/2015  . Closed hip fracture requiring operative repair, right, with routine healing, subsequent encounter 12/19/2015  . Colitis   . COPD (chronic obstructive pulmonary disease) ([place])   . Decreased rectal sphincter tone 11/21/2012  . Depression   . Drug-seeking behavior 02/26/2011   Use of multiple md to obtain benzos/narcotics pt must sign contract to receive controlled meds.   Marland Kitchen Dysphagia 01/11/2015  . GERD  (gastroesophageal reflux disease)   . Glaucoma 06/26/2011  . Glaucoma of both eyes   . Hearing loss, sensorineural 03/04/2014   [facility]; 02/26/14. There is not a medical or surgical treatment that would benefit her type of hearing loss. We discussed amplification in an overview fashion.    Marland Kitchen History of fall   . Insomnia, unspecified   . Macular degeneration 06/26/2011  . Migraines   . Nasal bone fracture 03/30/2014  . Osteoporosis   . Osteoporosis 02/26/2011  . Presence of right artificial hip joint   . Protein-calorie malnutrition, severe ([place]) 12/10/2012  . S/P ORIF (open reduction internal fixation) fracture 01/08/2016  . Schatzki's ring 01/14/2015  . Scoliosis   . Substance abuse    Benbzos and Narcotics, she does not get any of those medicines from [facility], we have empathetically told her that.  . Vertigo   . Vitamin B deficiency   . Vitamin D deficiency 08/25/2016  . Weight loss     Past Surgical History:  Procedure Laterality Date  . ABDOMINAL HYSTERECTOMY    . CATARACT EXTRACTION    . CHOLECYSTECTOMY    . ESOPHAGOGASTRODUODENOSCOPY (EGD) WITH PROPOFOL N/A 01/13/2015   Procedure: ESOPHAGOGASTRODUODENOSCOPY (EGD) WITH PROPOFOL;  Surgeon: [name], MD;  Location: [facility];  Service: Endoscopy;  Laterality: N/A;  . HIP ARTHROPLASTY Right 12/21/2015   Procedure: ARTHROPLASTY BIPOLAR HIP (HEMIARTHROPLASTY);  Surgeon: [name], MD;  Location: [facility];  Service: Orthopedics;  Laterality: Right;  . PARTIAL HYSTERECTOMY      Allergies as of 10/24/2016      Reactions   Macrobid [nitrofurantoin Macrocrystal] Rash   Azithromycin Other (See Comments)   unknown   Doxycycline Other (  See Comments)   unknown   Escitalopram Oxalate Other (See Comments)   unknown   Fioricet [butalbital-apap-caffeine] Other (See Comments)   Drunk.   Latex Other (See Comments)   unknown   Levofloxacin Other (See Comments)   unknown   Nsaids Other (See Comments)   This is not an  allergy. The patient was told by Dr. Rockne Menghini to avoid NSAIDs and stop Vicoprofen in to 2014 to avoid nephrotoxicity.  Allergic reaction not listed on MAR.   Oxycodone Other (See Comments)   reaction to synthetic codeine   Restasis [cyclosporine] Other (See Comments)   unknown   Sulfa Antibiotics Other (See Comments)   Reaction unknown   Biaxin [clarithromycin] Rash   With burning sensation      Medication List       Accurate as of 10/24/16  4:29 PM. Always use your most recent med list.          acetaminophen 325 MG tablet Commonly known as:  TYLENOL Take 650 mg by mouth every 6 (six) hours as needed for headache.   albuterol 108 (90 Base) MCG/ACT inhaler Commonly known as:  PROVENTIL HFA;VENTOLIN HFA Inhale 1 puff into the lungs every 6 (six) hours as needed for shortness of breath.   amLODipine 10 MG tablet Commonly known as:  NORVASC Take 10 mg by mouth daily.   ASPERCREME 10 % Lotn Generic drug:  Trolamine Salicylate Apply 1 application topically 2 (two) times daily as needed (for painful areas).   Biotin 5000 MCG Caps Take 1 capsule by mouth daily.   clonazePAM 0.5 MG tablet Commonly known as:  KLONOPIN Take 0.5 mg by mouth 2 (two) times daily.   Cranberry 425 MG Caps Take 425 mg by mouth 2 (two) times daily.   docusate sodium 100 MG capsule Commonly known as:  COLACE Take 100 mg by mouth 2 (two) times daily.   dorzolamide-timolol 22.3-6.8 MG/ML ophthalmic solution Commonly known as:  COSOPT Place 1 drop into both eyes 2 (two) times daily.   feeding supplement Liqd Take 1 Container by mouth 2 (two) times daily between meals.   ferrous sulfate 325 (65 FE) MG tablet Take 325 mg by mouth. Take one tablet daily   fludrocortisone 0.1 MG tablet Commonly known as:  FLORINEF Take 0.1 mg by mouth every morning.   HYDROcodone-acetaminophen 5-325 MG tablet Commonly known as:  NORCO/VICODIN Take 1 tablet by mouth 3 (three) times daily. At 9 am , 1 pm , and 9 pm  for pain   lactulose 10 GM/15ML solution Commonly known as:  CHRONULAC Take 15 mLs (10 g total) by mouth daily as needed for severe constipation.   lidocaine 5 % Commonly known as:  LIDODERM Place 1 patch onto the skin every 12 (twelve) hours as needed (for pain). Remove & Discard patch within 12 hours or as directed by MD   metoprolol tartrate 25 MG tablet Commonly known as:  LOPRESSOR Take 12.5 mg by mouth 2 (two) times daily.   nitroGLYCERIN 0.4 MG SL tablet Commonly known as:  NITROSTAT Place 0.4 mg under the tongue every 5 (five) minutes as needed for chest pain. May use 3 times   pantoprazole 40 MG tablet Commonly known as:  PROTONIX Take 1 tablet (40 mg total) by mouth daily at 6 (six) AM.   polyethylene glycol packet Commonly known as:  MIRALAX / GLYCOLAX Take 17 g by mouth daily.   QUEtiapine 200 MG 24 hr tablet Commonly known as:  SEROQUEL XR  Take 200 mg by mouth. Take one tablet with 50 mg to equal 250 mg once daily   SEROQUEL XR 50 MG Tb24 24 hr tablet Generic drug:  QUEtiapine Take 50 mg by mouth. Take one tablet with 200 mg to equal 250 mg daily   ranitidine 150 MG tablet Commonly known as:  ZANTAC Take 150 mg by mouth. Take one tablet two times daily as needed for heartburn   senna 8.6 MG tablet Commonly known as:  SENOKOT Take 2 tablets by mouth 2 (two) times daily as needed for constipation.   travoprost (benzalkonium) 0.004 % ophthalmic solution Commonly known as:  TRAVATAN Place 1 drop into both eyes at bedtime.   Vitamin D 1000 units capsule Take 1,000 Units by mouth daily with breakfast.       Meds ordered this encounter  Medications  . ferrous sulfate 325 (65 FE) MG tablet    Sig: Take 325 mg by mouth. Take one tablet daily    Immunization History  Administered Date(s) Administered  . Influenza Split 11/13/2011  . Influenza-Unspecified 02/13/2008, 11/30/2013  . PPD Test 12/15/2015  . Pneumococcal-Unspecified 02/13/2008  . Td  02/12/2009    Social History  Substance Use Topics  . Smoking status: Former Smoker    Packs/day: 0.50    Types: Cigarettes  . Smokeless tobacco: Never Used  . Alcohol use No    Review of Systems  DATA OBTAINED: from patient-Very limited, nurse,  family member GENERAL:  no fevers, fatigue, appetite changes SKIN: No itching, rash HEENT: Patient says she pulled her arm out of her eye this morning RESPIRATORY: No cough, wheezing, SOB CARDIAC: No chest pain, palpitations, lower extremity edema  GI: No abdominal pain, No N/V/D or constipation, No heartburn or reflux  GU: No dysuria, frequency or urgency, or incontinence  MUSCULOSKELETAL: No unrelieved bone/joint pain NEUROLOGIC: No headache, dizziness  PSYCHIATRIC: No overt anxiety or sadness  Vitals:   10/24/16 1622  BP: 118/66  Pulse: 89  Resp: 20  Temp: 98.7 F (37.1 C)   Body mass index is 21.57 kg/m. Physical Exam  GENERAL APPEARANCE: Alert,  No acute distress  SKIN: No diaphoresis rash HEENT: There is no swelling redness or irritation to either eye; upper and lower liver looked at carefully, there is no swelling or redness to those also; there is no indication there's been any disturbance in patients on at all RESPIRATORY: Breathing is even, unlabored. Lung sounds are clear   CARDIOVASCULAR: Heart RRR no murmurs, rubs or gallops. No peripheral edema  GASTROINTESTINAL: Abdomen is soft, non-tender, not distended w/ normal bowel sounds.  GENITOURINARY: Bladder non tender, not distended  MUSCULOSKELETAL: No abnormal joints or musculature NEUROLOGIC: Cranial nerves 2-12 grossly intact. Moves all extremities PSYCHIATRIC: Mood and affect appropriate to situation, no behavioral issues  Patient Active Problem List   Diagnosis Date Noted  . Delusions ([place])   . Paranoia ([place])   . Vitamin D deficiency 08/25/2016  . Dementia without behavioral disturbance 03/25/2016  . S/P ORIF (open reduction internal fixation) fracture  01/08/2016  . Oral thrush 01/08/2016  . HTN (hypertension) 01/08/2016  . Closed hip fracture requiring operative repair, right, with routine healing, subsequent encounter 12/19/2015  . Bipolar disease, chronic ([place]) 12/17/2015  . Decubitus ulcer of hip, stage 3 ([place]) 12/11/2015  . Ankle pain   . Encephalopathy   . Malnutrition of moderate degree 01/29/2015  . Altered mental status 01/26/2015  . Nausea and vomiting 01/17/2015  . Esophageal dysmotilities   .  Urinary tract infectious disease   . Weakness 01/14/2015  . Schatzki's ring 01/14/2015  . CKD (chronic kidney disease), stage III 01/14/2015  . Esophagus disorder   . Dysphagia 01/11/2015  . UTI (lower urinary tract infection) 03/30/2014  . Hearing loss, sensorineural 03/04/2014  . Nasal lesion 03/04/2014  . Unspecified constipation 12/12/2012  . Hypokalemia 12/11/2012  . Orthostatic hypotension 12/11/2012  . Arterial tortuosity (aorta) 12/11/2012  . Protein-calorie malnutrition, severe ([place]) 12/10/2012  . Decreased rectal sphincter tone 11/21/2012  . Idiopathic scoliosis 07/08/2012  . Macular degeneration 06/26/2011  . Vision disturbance 06/26/2011  . Glaucoma 06/26/2011  . Anemia of chronic disease 04/17/2011  . Drug-seeking behavior 02/26/2011  . Osteoporosis 02/26/2011  . Depression   . Anxiety   . COPD (chronic obstructive pulmonary disease) ([place])   . GERD (gastroesophageal reflux disease)   . Migraines   . Insomnia, unspecified     CMP     Component Value Date/Time   NA 139 09/03/2016 2005   NA 145 06/11/2016   K 3.9 09/03/2016 2005   CL 105 09/03/2016 2005   CO2 25 09/03/2016 2005   GLUCOSE 117 (H) 09/03/2016 2005   BUN 22 (H) 09/03/2016 2005   BUN 20 06/11/2016   CREATININE 0.70 09/03/2016 2005   CREATININE 0.85 11/09/2013 1135   CALCIUM 9.3 09/03/2016 2005   PROT 7.0 09/03/2016 2005   ALBUMIN 3.7 09/03/2016 2005   AST 22 09/03/2016 2005   ALT 12 (L) 09/03/2016 2005   ALKPHOS 106 09/03/2016 2005     BILITOT 0.3 09/03/2016 2005   GFRNONAA >60 09/03/2016 2005   GFRNONAA 64 11/09/2013 1135   GFRAA >60 09/03/2016 2005   GFRAA 74 11/09/2013 1135    Recent Labs  11/23/15 1631  12/15/15 0344  12/28/15 0431  12/29/15 0515 06/11/16 09/03/16 2005  NA 144  < > 142  < > 139  < > 139 145 139  K 4.6  < > 3.9  < > 3.2*  --  3.8 4.6 3.9  CL 110  < > 111  < > 106  --  107  --  105  CO2 27  < > 23  < > 23  --  21*  --  25  GLUCOSE 126*  < > 95  < > 110*  --  114*  --  117*  BUN 28*  < > 15  < > 12  < > 10 20 22*  CREATININE 0.93  < > 0.74  < > 0.60  < > 0.55 0.6 0.70  CALCIUM 9.0  < > 8.9  < > 8.3*  --  8.5*  --  9.3  MG 2.1  --  1.5*  --   --   --   --   --   --   < > = values in this interval not displayed.  Recent Labs  12/13/15 0439 12/19/15 1717 06/11/16 09/03/16 2005  AST 21 61* 16 22  ALT 10* 46 9 12*  ALKPHOS 61 52 89 106  BILITOT 0.9 0.6  --  0.3  PROT 5.5* 6.4*  --  7.0  ALBUMIN 2.7* 2.9*  --  3.7    Recent Labs  12/13/15 0439  12/19/15 1717  12/26/15 0416  12/27/15 0346 05/22/16 09/03/16 2005  WBC 9.0  < > 7.8  < > 5.7  < > 5.7 9.6 8.0  NEUTROABS 6.9  --  5.4  --   --   --   --   --  4.5  HGB 10.8*  < > 10.9*  < > 8.3*  --  8.4* 12.0 11.3*  HCT 33.6*  < > 35.2*  < > 25.9*  --  27.1* 40 35.6*  MCV 93.3  < > 93.9  < > 89.0  --  88.6  --  77.6*  PLT 215  < > 285  < > 357  --  406* 235 320  < > = values in this interval not displayed.  Recent Labs  06/11/16  CHOL 190  LDLCALC 116  TRIG 101   No results found for: [facility] Lab Results  Component Value Date   TSH 2.61 06/11/2016   Lab Results  Component Value Date   HGBA1C 5.4 06/11/2016   Lab Results  Component Value Date   CHOL 190 06/11/2016   HDL 54 06/11/2016   LDLCALC 116 06/11/2016   TRIG 101 06/11/2016    Significant Diagnostic Results in last 30 days:  No results found.  Assessment and Plan  PSYCHOSIS-today patient thought that she pulled lobe arm out of her iron she is very  disturbed and would not be comforted until I came to see her; patient was she has a doctor's appointment for her glaucoma tomorrow and we have reassured her that he has other instruments with which she can look at her eye just to make sure and this seemed to help to a small degree; patient's family as they are and they understand the situation     [name], MD

## 2016-10-27 ENCOUNTER — Encounter: Payer: Self-pay | Admitting: Internal Medicine

## 2016-10-27 NOTE — Assessment & Plan Note (Signed)
Stable and chronic; plan to continue Cosopt 1 drop each eye twice a day and Travatan 1 drop each eye at bedtime

## 2016-10-27 NOTE — Assessment & Plan Note (Signed)
Recently well controlled; Seroquel XR has been increased now to 250 mg by mouth daily

## 2016-10-27 NOTE — Assessment & Plan Note (Signed)
Continues; plan to continue Seroquel X are 250 mg daily

## 2016-10-30 ENCOUNTER — Non-Acute Institutional Stay (SKILLED_NURSING_FACILITY): Payer: Medicare Other | Admitting: Internal Medicine

## 2016-10-30 ENCOUNTER — Encounter: Payer: Self-pay | Admitting: Internal Medicine

## 2016-10-30 DIAGNOSIS — E559 Vitamin D deficiency, unspecified: Secondary | ICD-10-CM | POA: Diagnosis not present

## 2016-10-30 DIAGNOSIS — F0280 Dementia in other diseases classified elsewhere without behavioral disturbance: Secondary | ICD-10-CM

## 2016-10-30 DIAGNOSIS — N183 Chronic kidney disease, stage 3 unspecified: Secondary | ICD-10-CM

## 2016-10-30 DIAGNOSIS — G301 Alzheimer's disease with late onset: Secondary | ICD-10-CM

## 2016-10-30 NOTE — Progress Notes (Signed)
Location:  [place] Room Number: [place]:  SNF [phone])  [name], MD  Patient Care Team: [name], MD as PCP - General (Internal Medicine) [name], MD (Gastroenterology) [name], NP (Obstetrics and Gynecology)  Extended Emergency Contact Information Primary Emergency Contact: [name],[name] Address: [place], [place] Phone: [phone] Mobile Phone: [phone] Relation: Daughter Secondary Emergency Contact: [name] Phone: [phone] Mobile Phone: [phone] Relation: Daughter    Allergies: Amoxicillin; Ampicillin; Depakote [valproic acid]; Macrobid [nitrofurantoin macrocrystal]; Penicillins; Azithromycin; Doxycycline; Escitalopram oxalate; Fioricet [butalbital-apap-caffeine]; Latex; Levofloxacin; Nsaids; Oxycodone; Restasis [cyclosporine]; Sulfa antibiotics; and Biaxin [clarithromycin]  Chief Complaint  Patient presents with  . Medical Management of Chronic Issues    HPI: Patient is 83 y.o. female who is being seen for routine issues of dementia, chronic kidney disease stage III, now stage II and vitamin D deficiency.  Past Medical History:  Diagnosis Date  . Anxiety   . Arterial tortuosity (aorta) 12/11/2012  . Asthma   . Chronic kidney disease, stage IV (severe) ([place]) 12/11/2012  . Chronic mental illness   . CKD (chronic kidney disease), stage III ([place]) 01/14/2015  . Closed hip fracture requiring operative repair, right, with routine healing, subsequent encounter 12/19/2015  . Colitis   . COPD (chronic obstructive pulmonary disease) ([place])   . Decreased rectal sphincter tone 11/21/2012  . Depression   . Drug-seeking behavior 02/26/2011   Use of multiple md to obtain benzos/narcotics pt must sign contract to receive controlled meds.   Marland Kitchen Dysphagia 01/11/2015  . GERD (gastroesophageal reflux  disease)   . Glaucoma 06/26/2011  . Glaucoma of both eyes   . Hearing loss, sensorineural 03/04/2014   [facility]; 02/26/14. There is not a medical or surgical treatment that would benefit her type of hearing loss. We discussed amplification in an overview fashion.    Marland Kitchen History of fall   . Insomnia, unspecified   . Macular degeneration 06/26/2011  . Migraines   . Nasal bone fracture 03/30/2014  . Osteoporosis   . Osteoporosis 02/26/2011  . Presence of right artificial hip joint   . Protein-calorie malnutrition, severe ([place]) 12/10/2012  . S/P ORIF (open reduction internal fixation) fracture 01/08/2016  . Schatzki's ring 01/14/2015  . Scoliosis   . Substance abuse ([place])    Benbzos and Narcotics, she does not get any of those medicines from [facility], we have empathetically told her that.  . Vertigo   . Vitamin B deficiency   . Vitamin D deficiency 08/25/2016  . Weight loss     Past Surgical History:  Procedure Laterality Date  . ABDOMINAL HYSTERECTOMY    . ARTHROPLASTY BIPOLAR HIP (HEMIARTHROPLASTY) Right 12/21/2015   Performed by [name], MD at [place]  . CATARACT EXTRACTION    . CHOLECYSTECTOMY    . ESOPHAGOGASTRODUODENOSCOPY (EGD) WITH PROPOFOL N/A 01/13/2015   Performed by [name], MD at [place]  . PARTIAL HYSTERECTOMY      Allergies as of 10/30/2016      Reactions   Macrobid [nitrofurantoin Macrocrystal] Rash   Azithromycin Other (See Comments)   unknown   Doxycycline Other (See Comments)   unknown   Escitalopram Oxalate Other (See Comments)   unknown   Fioricet [butalbital-apap-caffeine] Other (See Comments)   Drunk.   Latex Other (See Comments)   unknown  Levofloxacin Other (See Comments)   unknown   Nsaids Other (See Comments)   This is not an allergy. The patient was told by Dr. Rockne Menghini to avoid NSAIDs and stop Vicoprofen in to 2014 to avoid nephrotoxicity.  Allergic reaction not listed on MAR.   Oxycodone Other (See Comments)   reaction to  synthetic codeine   Restasis [cyclosporine] Other (See Comments)   unknown   Sulfa Antibiotics Other (See Comments)   Reaction unknown   Biaxin [clarithromycin] Rash   With burning sensation      Medication List        Accurate as of 10/30/16 11:59 PM. Always use your most recent med list.          acetaminophen 325 MG tablet Commonly known as:  TYLENOL Take 650 mg by mouth every 6 (six) hours as needed for headache.   albuterol 108 (90 Base) MCG/ACT inhaler Commonly known as:  PROVENTIL HFA;VENTOLIN HFA Inhale 1 puff into the lungs every 6 (six) hours as needed for shortness of breath.   amLODipine 10 MG tablet Commonly known as:  NORVASC Take 10 mg by mouth daily.   ASPERCREME 10 % Lotn Generic drug:  Trolamine Salicylate Apply 1 application topically 2 (two) times daily as needed (for painful areas).   Biotin 5000 MCG Caps Take 1 capsule by mouth daily.   clonazePAM 0.5 MG tablet Commonly known as:  KLONOPIN Take 0.5 mg by mouth 2 (two) times daily.   Cranberry 425 MG Caps Take 425 mg by mouth 2 (two) times daily.   docusate sodium 100 MG capsule Commonly known as:  COLACE Take 100 mg by mouth 2 (two) times daily.   dorzolamide-timolol 22.3-6.8 MG/ML ophthalmic solution Commonly known as:  COSOPT Place 1 drop into both eyes 2 (two) times daily.   feeding supplement Liqd Take 1 Container by mouth 2 (two) times daily between meals.   ferrous sulfate 325 (65 FE) MG tablet Take 325 mg by mouth. Take one tablet daily   fludrocortisone 0.1 MG tablet Commonly known as:  FLORINEF Take 0.1 mg by mouth every morning.   HYDROcodone-acetaminophen 5-325 MG tablet Commonly known as:  NORCO/VICODIN Take 1 tablet by mouth 3 (three) times daily. At 9 am , 1 pm , and 9 pm for pain   lactulose 10 GM/15ML solution Commonly known as:  CHRONULAC Take 15 mLs (10 g total) by mouth daily as needed for severe constipation.   lidocaine 5 % Commonly known as:   LIDODERM Place 1 patch onto the skin every 12 (twelve) hours as needed (for pain). Remove & Discard patch within 12 hours or as directed by MD   metoprolol tartrate 25 MG tablet Commonly known as:  LOPRESSOR Take 12.5 mg by mouth 2 (two) times daily.   nitroGLYCERIN 0.4 MG SL tablet Commonly known as:  NITROSTAT Place 0.4 mg under the tongue every 5 (five) minutes as needed for chest pain. May use 3 times   pantoprazole 40 MG tablet Commonly known as:  PROTONIX Take 1 tablet (40 mg total) by mouth daily at 6 (six) AM.   polyethylene glycol packet Commonly known as:  MIRALAX / GLYCOLAX Take 17 g by mouth daily.   QUEtiapine 200 MG 24 hr tablet Commonly known as:  SEROQUEL XR Take 200 mg by mouth. Take one tablet with 50 mg to equal 250 mg once daily   SEROQUEL XR 50 MG Tb24 24 hr tablet Generic drug:  QUEtiapine Take 50 mg by mouth  at bedtime. Take one tablet with 200 mg to equal 250 mg daily   ranitidine 150 MG tablet Commonly known as:  ZANTAC Take 150 mg by mouth 2 (two) times daily as needed.   senna 8.6 MG tablet Commonly known as:  SENOKOT Take 2 tablets by mouth 2 (two) times daily as needed for constipation.   travoprost (benzalkonium) 0.004 % ophthalmic solution Commonly known as:  TRAVATAN Place 1 drop into both eyes at bedtime.   Vitamin D 1000 units capsule Take 1,000 Units by mouth daily with breakfast.       No orders of the defined types were placed in this encounter.   Immunization History  Administered Date(s) Administered  . Influenza Split 11/13/2011  . Influenza-Unspecified 02/13/2008, 11/30/2013  . PPD Test 12/15/2015  . Pneumococcal-Unspecified 02/13/2008  . Td 02/12/2009    Social History   Tobacco Use  . Smoking status: Former Smoker    Packs/day: 0.50    Types: Cigarettes  . Smokeless tobacco: Never Used  Substance Use Topics  . Alcohol use: No    Review of Systems  DATA OBTAINED: from patient-. Limited participation;  nursing-no new concerns GENERAL:  no fevers, fatigue, appetite changes SKIN: No itching, rash HEENT: No complaint RESPIRATORY: No cough, wheezing, SOB CARDIAC: No chest pain, palpitations, lower extremity edema  GI: No abdominal pain, No N/V/D or constipation, No heartburn or reflux  GU: No dysuria, frequency or urgency, or incontinence  MUSCULOSKELETAL: No unrelieved bone/joint pain NEUROLOGIC: No headache, dizziness  PSYCHIATRIC: No overt anxiety or sadness  Vitals:   10/30/16 1456  BP: 118/66  Pulse: 89  Resp: 20  Temp: 98.7 F (37.1 C)   Body mass index is 21.61 kg/m. Physical Exam  GENERAL APPEARANCE: Alert, conversant, No acute distress  SKIN: No diaphoresis rash HEENT: Unremarkable RESPIRATORY: Breathing is even, unlabored. Lung sounds are clear   CARDIOVASCULAR: Heart RRR no murmurs, rubs or gallops. No peripheral edema  GASTROINTESTINAL: Abdomen is soft, non-tender, not distended w/ normal bowel sounds.  GENITOURINARY: Bladder non tender, not distended  MUSCULOSKELETAL: No abnormal joints or musculature NEUROLOGIC: Cranial nerves 2-12 grossly intact. Moves all extremities PSYCHIATRIC: Mood and affect with dementia, no behavioral issues  Patient Active Problem List   Diagnosis Date Noted  . Delusions ([place])   . Paranoia ([place])   . Vitamin D deficiency 08/25/2016  . Dementia without behavioral disturbance 03/25/2016  . S/P ORIF (open reduction internal fixation) fracture 01/08/2016  . Oral thrush 01/08/2016  . HTN (hypertension) 01/08/2016  . Closed hip fracture requiring operative repair, right, with routine healing, subsequent encounter 12/19/2015  . Bipolar disease, chronic ([place]) 12/17/2015  . Decubitus ulcer of hip, stage 3 ([place]) 12/11/2015  . Ankle pain   . Encephalopathy   . Malnutrition of moderate degree 01/29/2015  . Altered mental status 01/26/2015  . Nausea and vomiting 01/17/2015  . Esophageal dysmotilities   . Urinary tract infectious disease    . Weakness 01/14/2015  . Schatzki's ring 01/14/2015  . CKD (chronic kidney disease), stage III ([place]) 01/14/2015  . Esophagus disorder   . Dysphagia 01/11/2015  . UTI (lower urinary tract infection) 03/30/2014  . Hearing loss, sensorineural 03/04/2014  . Nasal lesion 03/04/2014  . Unspecified constipation 12/12/2012  . Hypokalemia 12/11/2012  . Orthostatic hypotension 12/11/2012  . Arterial tortuosity (aorta) 12/11/2012  . Protein-calorie malnutrition, severe ([place]) 12/10/2012  . Decreased rectal sphincter tone 11/21/2012  . Idiopathic scoliosis 07/08/2012  . Macular degeneration 06/26/2011  . Vision disturbance 06/26/2011  .  Glaucoma 06/26/2011  . Anemia of chronic disease 04/17/2011  . Drug-seeking behavior 02/26/2011  . Osteoporosis 02/26/2011  . Depression   . Anxiety   . COPD (chronic obstructive pulmonary disease) ([place])   . GERD (gastroesophageal reflux disease)   . Migraines   . Insomnia, unspecified     CMP     Component Value Date/Time   NA 142 12/12/2016 1428   NA 145 06/11/2016   K 3.6 12/12/2016 1428   CL 109 12/12/2016 1428   CO2 22 12/12/2016 1428   GLUCOSE 90 12/12/2016 1428   BUN 14 12/12/2016 1428   BUN 20 06/11/2016   CREATININE 0.67 12/12/2016 1428   CREATININE 0.85 11/09/2013 1135   CALCIUM 9.3 12/12/2016 1428   PROT 6.5 12/12/2016 1428   ALBUMIN 3.4 (L) 12/12/2016 1428   AST 20 12/12/2016 1428   ALT 16 12/12/2016 1428   ALKPHOS 85 12/12/2016 1428   BILITOT 0.7 12/12/2016 1428   GFRNONAA >60 12/12/2016 1428   GFRNONAA 64 11/09/2013 1135   GFRAA >60 12/12/2016 1428   GFRAA 74 11/09/2013 1135   Recent Labs    06/11/16 09/03/16 2005 12/12/16 1428  NA 145 139 142  K 4.6 3.9 3.6  CL  --  105 109  CO2  --  25 22  GLUCOSE  --  117* 90  BUN 20 22* 14  CREATININE 0.6 0.70 0.67  CALCIUM  --  9.3 9.3   Recent Labs    06/11/16 09/03/16 2005 12/12/16 1428  AST 16 22 20   ALT 9 12* 16  ALKPHOS 89 106 85  BILITOT  --  0.3 0.7  PROT  --   7.0 6.5  ALBUMIN  --  3.7 3.4*   Recent Labs    05/22/16 09/03/16 2005 12/12/16 1428  WBC 9.6 8.0 7.8  NEUTROABS  --  4.5 4.6  HGB 12.0 11.3* 12.8  HCT 40 35.6* 39.9  MCV  --  77.6* 80.4  PLT 235 320 239   Recent Labs    06/11/16  CHOL 190  LDLCALC 116  TRIG 101   No results found for: [facility] Lab Results  Component Value Date   TSH 2.61 06/11/2016   Lab Results  Component Value Date   HGBA1C 5.4 06/11/2016   Lab Results  Component Value Date   CHOL 190 06/11/2016   HDL 54 06/11/2016   LDLCALC 116 06/11/2016   TRIG 101 06/11/2016    Significant Diagnostic Results in last 30 days:  Dg Chest 2 View  Result Date: 12/12/2016 CLINICAL DATA:  Weakness EXAM: CHEST  2 VIEW COMPARISON:  09/03/2016 FINDINGS: Cardiac shadow is within normal limits. The lungs are well aerated bilaterally. No focal infiltrate or sizable effusion is seen. Degenerative changes of the thoracic spine are noted. IMPRESSION: No active cardiopulmonary disease. Electronically Signed   By: Inez Catalina M.D.   On: 12/12/2016 13:41    Assessment and Plan  Dementia without behavioral disturbance Patient's condition still waxes and wanes; continue supportive care  CKD (chronic kidney disease), stage III GFR in October was greater than 60; creatinine 0.67 stable; patient no longer has chronic kidney disease stage III and is now stage II  Vitamin D deficiency Vitamin D level stable; continue replacement 1000 units by mouth daily     Braeson Rupe D. [name], MD

## 2016-11-07 DIAGNOSIS — F0390 Unspecified dementia without behavioral disturbance: Secondary | ICD-10-CM | POA: Diagnosis not present

## 2016-11-07 DIAGNOSIS — R440 Auditory hallucinations: Secondary | ICD-10-CM | POA: Diagnosis not present

## 2016-11-07 DIAGNOSIS — F29 Unspecified psychosis not due to a substance or known physiological condition: Secondary | ICD-10-CM | POA: Diagnosis not present

## 2016-11-07 DIAGNOSIS — F39 Unspecified mood [affective] disorder: Secondary | ICD-10-CM | POA: Diagnosis not present

## 2016-11-14 DIAGNOSIS — N390 Urinary tract infection, site not specified: Secondary | ICD-10-CM | POA: Diagnosis not present

## 2016-11-14 DIAGNOSIS — Z79899 Other long term (current) drug therapy: Secondary | ICD-10-CM | POA: Diagnosis not present

## 2016-11-14 DIAGNOSIS — R319 Hematuria, unspecified: Secondary | ICD-10-CM | POA: Diagnosis not present

## 2016-11-15 DIAGNOSIS — R262 Difficulty in walking, not elsewhere classified: Secondary | ICD-10-CM | POA: Diagnosis not present

## 2016-11-15 DIAGNOSIS — J449 Chronic obstructive pulmonary disease, unspecified: Secondary | ICD-10-CM | POA: Diagnosis not present

## 2016-11-16 ENCOUNTER — Non-Acute Institutional Stay (SKILLED_NURSING_FACILITY): Payer: Medicare Other | Admitting: Internal Medicine

## 2016-11-16 DIAGNOSIS — B9620 Unspecified Escherichia coli [E. coli] as the cause of diseases classified elsewhere: Secondary | ICD-10-CM | POA: Diagnosis not present

## 2016-11-16 DIAGNOSIS — N390 Urinary tract infection, site not specified: Secondary | ICD-10-CM | POA: Diagnosis not present

## 2016-11-18 ENCOUNTER — Encounter: Payer: Self-pay | Admitting: Internal Medicine

## 2016-11-18 NOTE — Progress Notes (Signed)
Location:   [facility] Service:   skilled nursing facility  Taylor Duos, MD  Patient Care Team: Taylor Duos, MD as PCP - General (Internal Medicine) [name], MD (Gastroenterology) [name], NP (Obstetrics and Gynecology)  Extended Emergency Contact Information Primary Emergency Contact: [name],[name] Address: [place] Phone: [phone] Mobile Phone: [phone] Relation: Daughter Secondary Emergency Contact: [name] Phone: [phone] Mobile Phone: [phone] Relation: Daughter    Allergies: Macrobid [nitrofurantoin macrocrystal]; Azithromycin; Doxycycline; Escitalopram oxalate; Fioricet [butalbital-apap-caffeine]; Latex; Levofloxacin; Nsaids; Oxycodone; Restasis [cyclosporine]; Sulfa antibiotics; and Biaxin [clarithromycin]  Chief Complaint  Patient presents with  . Acute Visit    For UTI    HPI: Patient is 83 y.o. female who Is being seen acutely for a urinary tract infection. Several days ago she complained of dysuria and the nurses noted increased confusion as well as some pus in her diaper so a urinalysis was obtained. Results returned today with greater than 100,000 colonies of Escherichia coli that is pansensitive. Patient has had no fever nausea vomiting or any other systemic symptom nothing makes it better, nothing makes it worse.  Past Medical History:  Diagnosis Date  . Anxiety   . Arterial tortuosity (aorta) 12/11/2012  . Asthma   . Chronic kidney disease, stage IV (severe) ([name]) 12/11/2012  . Chronic mental illness   . CKD (chronic kidney disease), stage III ([place]) 01/14/2015  . Closed hip fracture requiring operative repair, right, with routine healing, subsequent encounter 12/19/2015  . Colitis   . COPD (chronic obstructive pulmonary disease) ([place] Roth)   . Decreased rectal sphincter tone 11/21/2012  . Depression    . Drug-seeking behavior 02/26/2011   Use of multiple md to obtain benzos/narcotics pt must sign contract to receive controlled meds.   [place] Dysphagia 01/11/2015  . GERD (gastroesophageal reflux disease)   . Glaucoma 06/26/2011  . Glaucoma of both eyes   . Hearing loss, sensorineural 03/04/2014   [name]; 02/26/14. There is not a medical or surgical treatment that would benefit her type of hearing loss. We discussed amplification in an overview fashion.    Taylor Roth History of fall   . Insomnia, unspecified   . Macular degeneration 06/26/2011  . Migraines   . Nasal bone fracture 03/30/2014  . Osteoporosis   . Osteoporosis 02/26/2011  . Presence of right artificial hip joint   . Protein-calorie malnutrition, severe (Taylor Roth) 12/10/2012  . S/P ORIF (open reduction internal fixation) fracture 01/08/2016  . Schatzki's ring 01/14/2015  . Scoliosis   . Substance abuse ([place] Roth)    Benbzos and Narcotics, she does not get any of those medicines from [facility], we have empathetically told her that.  . Vertigo   . Vitamin B deficiency   . Vitamin D deficiency 08/25/2016  . Weight loss     Past Surgical History:  Procedure Laterality Date  . ABDOMINAL HYSTERECTOMY    . CATARACT EXTRACTION    . CHOLECYSTECTOMY    . ESOPHAGOGASTRODUODENOSCOPY (EGD) WITH PROPOFOL N/A 01/13/2015   Procedure: ESOPHAGOGASTRODUODENOSCOPY (EGD) WITH PROPOFOL;  Surgeon: [name], MD;  Location: [facility];  Service: Endoscopy;  Laterality: N/A;  . HIP ARTHROPLASTY Right 12/21/2015   Procedure: ARTHROPLASTY BIPOLAR HIP (HEMIARTHROPLASTY);  Surgeon: [name], MD;  Location: [facility];  Service: Orthopedics;  Laterality: Right;  . PARTIAL HYSTERECTOMY      Allergies  as of 11/16/2016      Reactions   Macrobid [nitrofurantoin Macrocrystal] Rash   Azithromycin Other (See Comments)   unknown   Doxycycline Other (See Comments)   unknown   Escitalopram Oxalate Other (See Comments)   unknown   Fioricet  [butalbital-apap-caffeine] Other (See Comments)   Drunk.   Latex Other (See Comments)   unknown   Levofloxacin Other (See Comments)   unknown   Nsaids Other (See Comments)   This is not an allergy. The patient was told by Dr. Rockne Menghini to avoid NSAIDs and stop Vicoprofen in to 2014 to avoid nephrotoxicity.  Allergic reaction not listed on MAR.   Oxycodone Other (See Comments)   reaction to synthetic codeine   Restasis [cyclosporine] Other (See Comments)   unknown   Sulfa Antibiotics Other (See Comments)   Reaction unknown   Biaxin [clarithromycin] Rash   With burning sensation      Medication List       Accurate as of 11/16/16 11:59 PM. Always use your most recent med list.          acetaminophen 325 MG tablet Commonly known as:  TYLENOL Take 650 mg by mouth every 6 (six) hours as needed for headache.   albuterol 108 (90 Base) MCG/ACT inhaler Commonly known as:  PROVENTIL HFA;VENTOLIN HFA Inhale 1 puff into the lungs every 6 (six) hours as needed for shortness of breath.   amLODipine 10 MG tablet Commonly known as:  NORVASC Take 10 mg by mouth daily.   ASPERCREME 10 % Lotn Generic drug:  Trolamine Salicylate Apply 1 application topically 2 (two) times daily as needed (for painful areas).   Biotin 5000 MCG Caps Take 1 capsule by mouth daily.   clonazePAM 0.5 MG tablet Commonly known as:  KLONOPIN Take 0.5 mg by mouth 2 (two) times daily.   Cranberry 425 MG Caps Take 425 mg by mouth 2 (two) times daily.   docusate sodium 100 MG capsule Commonly known as:  COLACE Take 100 mg by mouth 2 (two) times daily.   dorzolamide-timolol 22.3-6.8 MG/ML ophthalmic solution Commonly known as:  COSOPT Place 1 drop into both eyes 2 (two) times daily.   feeding supplement Liqd Take 1 Container by mouth 2 (two) times daily between meals.   ferrous sulfate 325 (65 FE) MG tablet Take 325 mg by mouth. Take one tablet daily   fludrocortisone 0.1 MG tablet Commonly known as:   FLORINEF Take 0.1 mg by mouth every morning.   HYDROcodone-acetaminophen 5-325 MG tablet Commonly known as:  NORCO/VICODIN Take 1 tablet by mouth 3 (three) times daily. At 9 am , 1 pm , and 9 pm for pain   lactulose 10 GM/15ML solution Commonly known as:  CHRONULAC Take 15 mLs (10 g total) by mouth daily as needed for severe constipation.   lidocaine 5 % Commonly known as:  LIDODERM Place 1 patch onto the skin every 12 (twelve) hours as needed (for pain). Remove & Discard patch within 12 hours or as directed by MD   metoprolol tartrate 25 MG tablet Commonly known as:  LOPRESSOR Take 12.5 mg by mouth 2 (two) times daily.   nitroGLYCERIN 0.4 MG SL tablet Commonly known as:  NITROSTAT Place 0.4 mg under the tongue every 5 (five) minutes as needed for chest pain. May use 3 times   pantoprazole 40 MG tablet Commonly known as:  PROTONIX Take 1 tablet (40 mg total) by mouth daily at 6 (six) AM.   polyethylene glycol packet  Commonly known as:  MIRALAX / GLYCOLAX Take 17 g by mouth daily.   QUEtiapine 200 MG 24 hr tablet Commonly known as:  SEROQUEL XR Take 200 mg by mouth. Take one tablet with 50 mg to equal 250 mg once daily   SEROQUEL XR 50 MG Tb24 24 hr tablet Generic drug:  QUEtiapine Take 50 mg by mouth. Take one tablet with 200 mg to equal 250 mg daily   ranitidine 150 MG tablet Commonly known as:  ZANTAC Take 150 mg by mouth. Take one tablet two times daily as needed for heartburn   senna 8.6 MG tablet Commonly known as:  SENOKOT Take 2 tablets by mouth 2 (two) times daily as needed for constipation.   travoprost (benzalkonium) 0.004 % ophthalmic solution Commonly known as:  TRAVATAN Place 1 drop into both eyes at bedtime.   Vitamin D 1000 units capsule Take 1,000 Units by mouth daily with breakfast.       No orders of the defined types were placed in this encounter.   Immunization History  Administered Date(s) Administered  . Influenza Split 11/13/2011    . Influenza-Unspecified 02/13/2008, 11/30/2013  . PPD Test 12/15/2015  . Pneumococcal-Unspecified 02/13/2008  . Td 02/12/2009    Social History  Substance Use Topics  . Smoking status: Former Smoker    Packs/day: 0.50    Types: Cigarettes  . Smokeless tobacco: Never Used  . Alcohol use No    Review of Systems  DATA OBTAINED: from patient-Limited capacity; nurse-per history of present illness GENERAL:  no fevers, fatigue, appetite changes SKIN: No itching, rash HEENT: No complaint RESPIRATORY: No cough, wheezing, SOB CARDIAC: No chest pain, palpitations, lower extremity edema  GI: No abdominal pain, No N/V/D or constipation, No heartburn or reflux  GU: + dysuria, no frequency or urgency, or incontinence  MUSCULOSKELETAL: No unrelieved bone/joint pain NEUROLOGIC: No headache, dizziness ; increased confusion noted PSYCHIATRIC: No overt anxiety or sadness  Vitals:   11/16/16 1630  BP: 105/64  Pulse: 71  Resp: 20  Temp: 98.6 F (37 C)   Body mass index is 21.61 kg/m. Physical Exam  GENERAL APPEARANCE: Alert, minimally conversant, No acute distress  SKIN: No diaphoresis rash HEENT: Unremarkable RESPIRATORY: Breathing is even, unlabored. Lung sounds are clear   CARDIOVASCULAR: Heart RRR no murmurs, rubs or gallops. No peripheral edema  GASTROINTESTINAL: Abdomen is soft, non-tender, not distended w/ normal bowel sounds.  GENITOURINARY: Bladder mild tender, not distended  MUSCULOSKELETAL: No abnormal joints or musculature NEUROLOGIC: Cranial nerves 2-12 grossly intact. Moves all extremities PSYCHIATRIC: Mood and affect with dementia, no behavioral issues  Patient Active Problem List   Diagnosis Date Noted  . Delusions ([place])   . Paranoia ([place])   . Vitamin D deficiency 08/25/2016  . Dementia without behavioral disturbance 03/25/2016  . S/P ORIF (open reduction internal fixation) fracture 01/08/2016  . Oral thrush 01/08/2016  . HTN (hypertension) 01/08/2016  .  Closed hip fracture requiring operative repair, right, with routine healing, subsequent encounter 12/19/2015  . Bipolar disease, chronic ([place]) 12/17/2015  . Decubitus ulcer of hip, stage 3 ([place]) 12/11/2015  . Ankle pain   . Encephalopathy   . Malnutrition of moderate degree 01/29/2015  . Altered mental status 01/26/2015  . Nausea and vomiting 01/17/2015  . Esophageal dysmotilities   . Urinary tract infectious disease   . Weakness 01/14/2015  . Schatzki's ring 01/14/2015  . CKD (chronic kidney disease), stage III ([place]) 01/14/2015  . Esophagus disorder   . Dysphagia 01/11/2015  .  UTI (lower urinary tract infection) 03/30/2014  . Hearing loss, sensorineural 03/04/2014  . Nasal lesion 03/04/2014  . Unspecified constipation 12/12/2012  . Hypokalemia 12/11/2012  . Orthostatic hypotension 12/11/2012  . Arterial tortuosity (aorta) 12/11/2012  . Protein-calorie malnutrition, severe ([place]) 12/10/2012  . Decreased rectal sphincter tone 11/21/2012  . Idiopathic scoliosis 07/08/2012  . Macular degeneration 06/26/2011  . Vision disturbance 06/26/2011  . Glaucoma 06/26/2011  . Anemia of chronic disease 04/17/2011  . Drug-seeking behavior 02/26/2011  . Osteoporosis 02/26/2011  . Depression   . Anxiety   . COPD (chronic obstructive pulmonary disease) ([place])   . GERD (gastroesophageal reflux disease)   . Migraines   . Insomnia, unspecified     CMP     Component Value Date/Time   NA 139 09/03/2016 2005   NA 145 06/11/2016   K 3.9 09/03/2016 2005   CL 105 09/03/2016 2005   CO2 25 09/03/2016 2005   GLUCOSE 117 (H) 09/03/2016 2005   BUN 22 (H) 09/03/2016 2005   BUN 20 06/11/2016   CREATININE 0.70 09/03/2016 2005   CREATININE 0.85 11/09/2013 1135   CALCIUM 9.3 09/03/2016 2005   PROT 7.0 09/03/2016 2005   ALBUMIN 3.7 09/03/2016 2005   AST 22 09/03/2016 2005   ALT 12 (L) 09/03/2016 2005   ALKPHOS 106 09/03/2016 2005   BILITOT 0.3 09/03/2016 2005   GFRNONAA >60 09/03/2016 2005    GFRNONAA 64 11/09/2013 1135   GFRAA >60 09/03/2016 2005   GFRAA 74 11/09/2013 1135    Recent Labs  11/23/15 1631  12/15/15 0344  12/28/15 0431  12/29/15 0515 06/11/16 09/03/16 2005  NA 144  < > 142  < > 139  < > 139 145 139  K 4.6  < > 3.9  < > 3.2*  --  3.8 4.6 3.9  CL 110  < > 111  < > 106  --  107  --  105  CO2 27  < > 23  < > 23  --  21*  --  25  GLUCOSE 126*  < > 95  < > 110*  --  114*  --  117*  BUN 28*  < > 15  < > 12  < > 10 20 22*  CREATININE 0.93  < > 0.74  < > 0.60  < > 0.55 0.6 0.70  CALCIUM 9.0  < > 8.9  < > 8.3*  --  8.5*  --  9.3  MG 2.1  --  1.5*  --   --   --   --   --   --   < > = values in this interval not displayed.  Recent Labs  12/13/15 0439 12/19/15 1717 06/11/16 09/03/16 2005  AST 21 61* 16 22  ALT 10* 46 9 12*  ALKPHOS 61 52 89 106  BILITOT 0.9 0.6  --  0.3  PROT 5.5* 6.4*  --  7.0  ALBUMIN 2.7* 2.9*  --  3.7    Recent Labs  12/13/15 0439  12/19/15 1717  12/26/15 0416  12/27/15 0346 05/22/16 09/03/16 2005  WBC 9.0  < > 7.8  < > 5.7  < > 5.7 9.6 8.0  NEUTROABS 6.9  --  5.4  --   --   --   --   --  4.5  HGB 10.8*  < > 10.9*  < > 8.3*  --  8.4* 12.0 11.3*  HCT 33.6*  < > 35.2*  < > 25.9*  --  27.1* 40 35.6*  MCV 93.3  < > 93.9  < > 89.0  --  88.6  --  77.6*  PLT 215  < > 285  < > 357  --  406* 235 320  < > = values in this interval not displayed.  Recent Labs  06/11/16  CHOL 190  LDLCALC 116  TRIG 101   No results found for: [facility] Lab Results  Component Value Date   TSH 2.61 06/11/2016   Lab Results  Component Value Date   HGBA1C 5.4 06/11/2016   Lab Results  Component Value Date   CHOL 190 06/11/2016   HDL 54 06/11/2016   LDLCALC 116 06/11/2016   TRIG 101 06/11/2016    Significant Diagnostic Results in last 30 days:  No results found.  Assessment and Plan  ESCHERICHIA COLI UTI-is pansensitive the patient is allergic to multiple medications; calculated creatinine clearance is 50; plan to treat with Omnicef 300  mg every 127 days; continue to monitor    Inocencio Homes, MD

## 2016-11-19 ENCOUNTER — Encounter: Payer: Self-pay | Admitting: Internal Medicine

## 2016-11-25 ENCOUNTER — Encounter: Payer: Self-pay | Admitting: Internal Medicine

## 2016-11-27 ENCOUNTER — Encounter: Payer: Self-pay | Admitting: Internal Medicine

## 2016-11-27 ENCOUNTER — Non-Acute Institutional Stay (SKILLED_NURSING_FACILITY): Payer: Medicare Other | Admitting: Internal Medicine

## 2016-11-27 DIAGNOSIS — I10 Essential (primary) hypertension: Secondary | ICD-10-CM | POA: Diagnosis not present

## 2016-11-27 DIAGNOSIS — J449 Chronic obstructive pulmonary disease, unspecified: Secondary | ICD-10-CM | POA: Diagnosis not present

## 2016-11-27 DIAGNOSIS — K219 Gastro-esophageal reflux disease without esophagitis: Secondary | ICD-10-CM

## 2016-11-27 NOTE — Progress Notes (Signed)
Location:  [facility] Room Number: 263Z Place of Service:  SNF [phone])  [name], MD  Patient Care Team: [name], MD as PCP - General (Internal Medicine) [name], MD (Gastroenterology) [name], NP (Obstetrics and Gynecology)  Extended Emergency Contact Information Primary Emergency Contact: [name],[name] Address: [place] [place], [place] Phone: [phone] Mobile Phone: [phone] Relation: Daughter Secondary Emergency Contact: [name] Phone: [phone] Mobile Phone: [phone] Relation: Daughter    Allergies: Macrobid [nitrofurantoin macrocrystal]; Azithromycin; Doxycycline; Escitalopram oxalate; Fioricet [butalbital-apap-caffeine]; Latex; Levofloxacin; Nsaids; Oxycodone; Restasis [cyclosporine]; Sulfa antibiotics; and Biaxin [clarithromycin]  Chief Complaint  Patient presents with  . Medical Management of Chronic Issues    routine visit    HPI: Patient is 83 y.o. female who Is being seen for routine issues of hypertension, COPD, and GERD.  Past Medical History:  Diagnosis Date  . Anxiety   . Arterial tortuosity (aorta) 12/11/2012  . Asthma   . Chronic kidney disease, stage IV (severe) ([place]) 12/11/2012  . Chronic mental illness   . CKD (chronic kidney disease), stage III ([place]) 01/14/2015  . Closed hip fracture requiring operative repair, right, with routine healing, subsequent encounter 12/19/2015  . Colitis   . COPD (chronic obstructive pulmonary disease) ([place])   . Decreased rectal sphincter tone 11/21/2012  . Depression   . Drug-seeking behavior 02/26/2011   Use of multiple md to obtain benzos/narcotics pt must sign contract to receive controlled meds.   Marland Kitchen Dysphagia 01/11/2015  . GERD (gastroesophageal reflux disease)   . Glaucoma 06/26/2011  . Glaucoma of both eyes   . Hearing loss, sensorineural  03/04/2014   [facility]; 02/26/14. There is not a medical or surgical treatment that would benefit her type of hearing loss. We discussed amplification in an overview fashion.    Marland Kitchen History of fall   . Insomnia, unspecified   . Macular degeneration 06/26/2011  . Migraines   . Nasal bone fracture 03/30/2014  . Osteoporosis   . Osteoporosis 02/26/2011  . Presence of right artificial hip joint   . Protein-calorie malnutrition, severe ([place]) 12/10/2012  . S/P ORIF (open reduction internal fixation) fracture 01/08/2016  . Schatzki's ring 01/14/2015  . Scoliosis   . Substance abuse ([place])    Benbzos and Narcotics, she does not get any of those medicines from [facility], we have empathetically told her that.  . Vertigo   . Vitamin B deficiency   . Vitamin D deficiency 08/25/2016  . Weight loss     Past Surgical History:  Procedure Laterality Date  . ABDOMINAL HYSTERECTOMY    . CATARACT EXTRACTION    . CHOLECYSTECTOMY    . ESOPHAGOGASTRODUODENOSCOPY (EGD) WITH PROPOFOL N/A 01/13/2015   Procedure: ESOPHAGOGASTRODUODENOSCOPY (EGD) WITH PROPOFOL;  Surgeon: [name], MD;  Location: [facility];  Service: Endoscopy;  Laterality: N/A;  . HIP ARTHROPLASTY Right 12/21/2015   Procedure: ARTHROPLASTY BIPOLAR HIP (HEMIARTHROPLASTY);  Surgeon: [name], MD;  Location: [facility];  Service: Orthopedics;  Laterality: Right;  . PARTIAL HYSTERECTOMY      Allergies as of 11/27/2016      Reactions   Macrobid [nitrofurantoin Macrocrystal] Rash   Azithromycin Other (See Comments)   unknown   Doxycycline Other (See Comments)   unknown   Escitalopram Oxalate Other (See Comments)   unknown   Fioricet [butalbital-apap-caffeine] Other (See Comments)  Drunk.   Latex Other (See Comments)   unknown   Levofloxacin Other (See Comments)   unknown   Nsaids Other (See Comments)   This is not an allergy. The patient was told by Dr. Rockne Menghini to avoid NSAIDs and stop Vicoprofen in to 2014 to avoid  nephrotoxicity.  Allergic reaction not listed on MAR.   Oxycodone Other (See Comments)   reaction to synthetic codeine   Restasis [cyclosporine] Other (See Comments)   unknown   Sulfa Antibiotics Other (See Comments)   Reaction unknown   Biaxin [clarithromycin] Rash   With burning sensation      Medication List       Accurate as of 11/27/16 11:59 PM. Always use your most recent med list.          acetaminophen 325 MG tablet Commonly known as:  TYLENOL Take 650 mg by mouth every 6 (six) hours as needed for headache.   albuterol 108 (90 Base) MCG/ACT inhaler Commonly known as:  PROVENTIL HFA;VENTOLIN HFA Inhale 1 puff into the lungs every 6 (six) hours as needed for shortness of breath.   ASPERCREME 10 % Lotn Generic drug:  Trolamine Salicylate Apply 1 application topically 2 (two) times daily as needed (for painful areas).   Biotin 5000 MCG Caps Take 1 capsule by mouth daily.   clonazePAM 0.5 MG tablet Commonly known as:  KLONOPIN Take 0.5 mg by mouth 2 (two) times daily.   Cranberry 425 MG Caps Take 425 mg by mouth 2 (two) times daily.   docusate sodium 100 MG capsule Commonly known as:  COLACE Take 100 mg by mouth 2 (two) times daily.   dorzolamide-timolol 22.3-6.8 MG/ML ophthalmic solution Commonly known as:  COSOPT Place 1 drop into both eyes 2 (two) times daily.   feeding supplement Liqd Take 1 Container by mouth 2 (two) times daily between meals.   NUTRITIONAL SUPPLEMENT Liqd Take by mouth. Med Pass 8 oz twice daily to support weight status   ferrous sulfate 325 (65 FE) MG tablet Take 325 mg by mouth. Take one tablet daily   fludrocortisone 0.1 MG tablet Commonly known as:  FLORINEF Take 0.1 mg by mouth every morning.   HYDROcodone-acetaminophen 5-325 MG tablet Commonly known as:  NORCO/VICODIN Take 1 tablet by mouth 3 (three) times daily. At 9 am , 1 pm , and 9 pm for pain   lactulose 10 GM/15ML solution Commonly known as:  CHRONULAC Take 15  mLs (10 g total) by mouth daily as needed for severe constipation.   lidocaine 5 % Commonly known as:  LIDODERM Place 1 patch onto the skin every 12 (twelve) hours as needed (for pain). Remove & Discard patch within 12 hours or as directed by MD   metoprolol tartrate 25 MG tablet Commonly known as:  LOPRESSOR Take 12.5 mg by mouth 2 (two) times daily.   nitroGLYCERIN 0.4 MG SL tablet Commonly known as:  NITROSTAT Place 0.4 mg under the tongue every 5 (five) minutes as needed for chest pain. May use 3 times   pantoprazole 40 MG tablet Commonly known as:  PROTONIX Take 1 tablet (40 mg total) by mouth daily at 6 (six) AM.   polyethylene glycol packet Commonly known as:  MIRALAX / GLYCOLAX Take 17 g by mouth daily.   QUEtiapine 200 MG 24 hr tablet Commonly known as:  SEROQUEL XR Take 200 mg by mouth. Take one tablet with 50 mg to equal 250 mg once daily   SEROQUEL XR 50 MG Tb24  24 hr tablet Generic drug:  QUEtiapine Take 50 mg by mouth. Take one tablet with 200 mg to equal 250 mg daily   ranitidine 150 MG tablet Commonly known as:  ZANTAC Take 150 mg by mouth. Take one tablet two times daily as needed for heartburn   senna 8.6 MG tablet Commonly known as:  SENOKOT Take 2 tablets by mouth 2 (two) times daily as needed for constipation.   travoprost (benzalkonium) 0.004 % ophthalmic solution Commonly known as:  TRAVATAN Place 1 drop into both eyes at bedtime.   Vitamin D 1000 units capsule Take 1,000 Units by mouth daily with breakfast.       Meds ordered this encounter  Medications  . NUTRITIONAL SUPPLEMENT LIQD    Sig: Take by mouth. Med Pass 8 oz twice daily to support weight status    Immunization History  Administered Date(s) Administered  . Influenza Split 11/13/2011  . Influenza-Unspecified 02/13/2008, 11/30/2013  . PPD Test 12/15/2015  . Pneumococcal-Unspecified 02/13/2008  . Td 02/12/2009    Social History  Substance Use Topics  . Smoking status:  Former Smoker    Packs/day: 0.50    Types: Cigarettes  . Smokeless tobacco: Never Used  . Alcohol use No    Review of Systems  DATA OBTAINED: from patient-Limited; nurse-no concerns GENERAL:  no fevers, fatigue, appetite changes SKIN: No itching, rash HEENT: No complaint RESPIRATORY: No cough, wheezing, SOB CARDIAC: No chest pain, palpitations, lower extremity edema  GI: No abdominal pain, No N/V/D or constipation, No heartburn or reflux  GU: No dysuria, frequency or urgency, or incontinence  MUSCULOSKELETAL: No unrelieved bone/joint pain NEUROLOGIC: No headache, dizziness  PSYCHIATRIC: No overt anxiety or sadness  Vitals:   11/27/16 1416  BP: (!) 134/94  Pulse: 64  Resp: 17  Temp: (!) 97.2 F (36.2 C)   Body mass index is 20.8 kg/m. Physical Exam  GENERAL APPEARANCE: Alert, Minimally conversant, No acute distress  SKIN: No diaphoresis rash HEENT: Unremarkable RESPIRATORY: Breathing is even, unlabored. Lung sounds are clear   CARDIOVASCULAR: Heart RRR no murmurs, rubs or gallops. No peripheral edema  GASTROINTESTINAL: Abdomen is soft, non-tender, not distended w/ normal bowel sounds.  GENITOURINARY: Bladder non tender, not distended  MUSCULOSKELETAL: No abnormal joints or musculature NEUROLOGIC: Cranial nerves 2-12 grossly intact. Moves all extremities PSYCHIATRIC: Mood and affect with dementia, no behavioral issues  Patient Active Problem List   Diagnosis Date Noted  . Delusions ([place])   . Paranoia ([place])   . Vitamin D deficiency 08/25/2016  . Dementia without behavioral disturbance 03/25/2016  . S/P ORIF (open reduction internal fixation) fracture 01/08/2016  . Oral thrush 01/08/2016  . HTN (hypertension) 01/08/2016  . Closed hip fracture requiring operative repair, right, with routine healing, subsequent encounter 12/19/2015  . Bipolar disease, chronic ([place]) 12/17/2015  . Decubitus ulcer of hip, stage 3 ([place]) 12/11/2015  . Ankle pain   . Encephalopathy   .  Malnutrition of moderate degree 01/29/2015  . Altered mental status 01/26/2015  . Nausea and vomiting 01/17/2015  . Esophageal dysmotilities   . Urinary tract infectious disease   . Weakness 01/14/2015  . Schatzki's ring 01/14/2015  . CKD (chronic kidney disease), stage III ([place]) 01/14/2015  . Esophagus disorder   . Dysphagia 01/11/2015  . UTI (lower urinary tract infection) 03/30/2014  . Hearing loss, sensorineural 03/04/2014  . Nasal lesion 03/04/2014  . Unspecified constipation 12/12/2012  . Hypokalemia 12/11/2012  . Orthostatic hypotension 12/11/2012  . Arterial tortuosity (aorta) 12/11/2012  .  Protein-calorie malnutrition, severe ([place]) 12/10/2012  . Decreased rectal sphincter tone 11/21/2012  . Idiopathic scoliosis 07/08/2012  . Macular degeneration 06/26/2011  . Vision disturbance 06/26/2011  . Glaucoma 06/26/2011  . Anemia of chronic disease 04/17/2011  . Drug-seeking behavior 02/26/2011  . Osteoporosis 02/26/2011  . Depression   . Anxiety   . COPD (chronic obstructive pulmonary disease) ([place])   . GERD (gastroesophageal reflux disease)   . Migraines   . Insomnia, unspecified     CMP     Component Value Date/Time   NA 139 09/03/2016 2005   NA 145 06/11/2016   K 3.9 09/03/2016 2005   CL 105 09/03/2016 2005   CO2 25 09/03/2016 2005   GLUCOSE 117 (H) 09/03/2016 2005   BUN 22 (H) 09/03/2016 2005   BUN 20 06/11/2016   CREATININE 0.70 09/03/2016 2005   CREATININE 0.85 11/09/2013 1135   CALCIUM 9.3 09/03/2016 2005   PROT 7.0 09/03/2016 2005   ALBUMIN 3.7 09/03/2016 2005   AST 22 09/03/2016 2005   ALT 12 (L) 09/03/2016 2005   ALKPHOS 106 09/03/2016 2005   BILITOT 0.3 09/03/2016 2005   GFRNONAA >60 09/03/2016 2005   GFRNONAA 64 11/09/2013 1135   GFRAA >60 09/03/2016 2005   GFRAA 74 11/09/2013 1135    Recent Labs  12/15/15 0344  12/28/15 0431  12/29/15 0515 06/11/16 09/03/16 2005  NA 142  < > 139  < > 139 145 139  K 3.9  < > 3.2*  --  3.8 4.6 3.9  CL  111  < > 106  --  107  --  105  CO2 23  < > 23  --  21*  --  25  GLUCOSE 95  < > 110*  --  114*  --  117*  BUN 15  < > 12  < > 10 20 22*  CREATININE 0.74  < > 0.60  < > 0.55 0.6 0.70  CALCIUM 8.9  < > 8.3*  --  8.5*  --  9.3  MG 1.5*  --   --   --   --   --   --   < > = values in this interval not displayed.  Recent Labs  12/13/15 0439 12/19/15 1717 06/11/16 09/03/16 2005  AST 21 61* 16 22  ALT 10* 46 9 12*  ALKPHOS 61 52 89 106  BILITOT 0.9 0.6  --  0.3  PROT 5.5* 6.4*  --  7.0  ALBUMIN 2.7* 2.9*  --  3.7    Recent Labs  12/13/15 0439  12/19/15 1717  12/26/15 0416  12/27/15 0346 05/22/16 09/03/16 2005  WBC 9.0  < > 7.8  < > 5.7  < > 5.7 9.6 8.0  NEUTROABS 6.9  --  5.4  --   --   --   --   --  4.5  HGB 10.8*  < > 10.9*  < > 8.3*  --  8.4* 12.0 11.3*  HCT 33.6*  < > 35.2*  < > 25.9*  --  27.1* 40 35.6*  MCV 93.3  < > 93.9  < > 89.0  --  88.6  --  77.6*  PLT 215  < > 285  < > 357  --  406* 235 320  < > = values in this interval not displayed.  Recent Labs  06/11/16  CHOL 190  LDLCALC 116  TRIG 101   No results found for: [facility] Lab Results  Component Value Date  TSH 2.61 06/11/2016   Lab Results  Component Value Date   HGBA1C 5.4 06/11/2016   Lab Results  Component Value Date   CHOL 190 06/11/2016   HDL 54 06/11/2016   LDLCALC 116 06/11/2016   TRIG 101 06/11/2016    Significant Diagnostic Results in last 30 days:  No results found.  Assessment and Plan  HTN (hypertension) Controlled; patient now only on Lopressor 25 mg by mouth twice a day; plan to continue  COPD (chronic obstructive pulmonary disease) ([place]) No reported exacerbation; continue when necessary albuterol  GERD (gastroesophageal reflux disease) No reported reflux or aspiration; continue Protonix 40 mg by mouth daily    Anne D. [name], MD

## 2016-11-28 ENCOUNTER — Non-Acute Institutional Stay (SKILLED_NURSING_FACILITY): Payer: Medicare Other | Admitting: Internal Medicine

## 2016-11-28 ENCOUNTER — Encounter: Payer: Self-pay | Admitting: Internal Medicine

## 2016-11-28 DIAGNOSIS — B370 Candidal stomatitis: Secondary | ICD-10-CM | POA: Diagnosis not present

## 2016-11-28 NOTE — Progress Notes (Signed)
Location:  [facility] Room Number: 388E Place of Service:  SNF [id number])  [name], MD  Patient Care Team: [name], MD as PCP - General (Internal Medicine) [name], MD (Gastroenterology) [name], NP (Obstetrics and Gynecology)  Extended Emergency Contact Information Primary Emergency Contact: [name],[name] Address: [place], [place] Phone: [phone] Mobile Phone: [phone] Relation: Daughter Secondary Emergency Contact: [name] Phone: [phone] Mobile Phone: [phone] Relation: Daughter    Allergies: Macrobid [nitrofurantoin macrocrystal]; Azithromycin; Doxycycline; Escitalopram oxalate; Fioricet [butalbital-apap-caffeine]; Latex; Levofloxacin; Nsaids; Oxycodone; Restasis [cyclosporine]; Sulfa antibiotics; and Biaxin [clarithromycin]  Chief Complaint  Patient presents with  . Acute Visit    thrush in mouth and throat    HPI: Patient is 83 y.o. female who Nursing asked me to see for complained of pain on tongue  and with swallowing admitted today for the first time. Patient was treated last week with antibiotics for a UTI. No reported fever or weight loss.  Past Medical History:  Diagnosis Date  . Anxiety   . Arterial tortuosity (aorta) 12/11/2012  . Asthma   . Chronic kidney disease, stage IV (severe) ([place]) 12/11/2012  . Chronic mental illness   . CKD (chronic kidney disease), stage III ([place]) 01/14/2015  . Closed hip fracture requiring operative repair, right, with routine healing, subsequent encounter 12/19/2015  . Colitis   . COPD (chronic obstructive pulmonary disease) ([place])   . Decreased rectal sphincter tone 11/21/2012  . Depression   . Drug-seeking behavior 02/26/2011   Use of multiple md to obtain benzos/narcotics pt must sign contract to receive controlled meds.   Marland Kitchen Dysphagia 01/11/2015  .  GERD (gastroesophageal reflux disease)   . Glaucoma 06/26/2011  . Glaucoma of both eyes   . Hearing loss, sensorineural 03/04/2014   [facility] ENT; 02/26/14. There is not a medical or surgical treatment that would benefit her type of hearing loss. We discussed amplification in an overview fashion.    Marland Kitchen History of fall   . Insomnia, unspecified   . Macular degeneration 06/26/2011  . Migraines   . Nasal bone fracture 03/30/2014  . Osteoporosis   . Osteoporosis 02/26/2011  . Presence of right artificial hip joint   . Protein-calorie malnutrition, severe ([place]) 12/10/2012  . S/P ORIF (open reduction internal fixation) fracture 01/08/2016  . Schatzki's ring 01/14/2015  . Scoliosis   . Substance abuse ([place])    Benbzos and Narcotics, she does not get any of those medicines from [facility], we have empathetically told her that.  . Vertigo   . Vitamin B deficiency   . Vitamin D deficiency 08/25/2016  . Weight loss     Past Surgical History:  Procedure Laterality Date  . ABDOMINAL HYSTERECTOMY    . CATARACT EXTRACTION    . CHOLECYSTECTOMY    . ESOPHAGOGASTRODUODENOSCOPY (EGD) WITH PROPOFOL N/A 01/13/2015   Procedure: ESOPHAGOGASTRODUODENOSCOPY (EGD) WITH PROPOFOL;  Surgeon: [name], MD;  Location: [facility];  Service: Endoscopy;  Laterality: N/A;  . HIP ARTHROPLASTY Right 12/21/2015   Procedure: ARTHROPLASTY BIPOLAR HIP (HEMIARTHROPLASTY);  Surgeon: [name], MD;  Location: [facility];  Service: Orthopedics;  Laterality: Right;  . PARTIAL HYSTERECTOMY      Allergies as of 11/28/2016      Reactions   Macrobid [nitrofurantoin Macrocrystal] Rash   Azithromycin Other (See Comments)   unknown  Doxycycline Other (See Comments)   unknown   Escitalopram Oxalate Other (See Comments)   unknown   Fioricet [butalbital-apap-caffeine] Other (See Comments)   Drunk.   Latex Other (See Comments)   unknown   Levofloxacin Other (See Comments)   unknown   Nsaids Other (See Comments)   This  is not an allergy. The patient was told by Dr. Rockne Menghini to avoid NSAIDs and stop Vicoprofen in to 2014 to avoid nephrotoxicity.  Allergic reaction not listed on MAR.   Oxycodone Other (See Comments)   reaction to synthetic codeine   Restasis [cyclosporine] Other (See Comments)   unknown   Sulfa Antibiotics Other (See Comments)   Reaction unknown   Biaxin [clarithromycin] Rash   With burning sensation      Medication List       Accurate as of 11/28/16  5:05 PM. Always use your most recent med list.          acetaminophen 325 MG tablet Commonly known as:  TYLENOL Take 650 mg by mouth every 6 (six) hours as needed for headache.   albuterol 108 (90 Base) MCG/ACT inhaler Commonly known as:  PROVENTIL HFA;VENTOLIN HFA Inhale 1 puff into the lungs every 6 (six) hours as needed for shortness of breath.   ASPERCREME 10 % Lotn Generic drug:  Trolamine Salicylate Apply 1 application topically 2 (two) times daily as needed (for painful areas).   Biotin 5000 MCG Caps Take 1 capsule by mouth daily.   clonazePAM 0.5 MG tablet Commonly known as:  KLONOPIN Take 0.5 mg by mouth 2 (two) times daily.   Cranberry 425 MG Caps Take 425 mg by mouth 2 (two) times daily.   docusate sodium 100 MG capsule Commonly known as:  COLACE Take 100 mg by mouth 2 (two) times daily.   dorzolamide-timolol 22.3-6.8 MG/ML ophthalmic solution Commonly known as:  COSOPT Place 1 drop into both eyes 2 (two) times daily.   feeding supplement Liqd Take 1 Container by mouth 2 (two) times daily between meals.   NUTRITIONAL SUPPLEMENT Liqd Take by mouth. Med Pass 8 oz twice daily to support weight status   ferrous sulfate 325 (65 FE) MG tablet Take 325 mg by mouth. Take one tablet daily   fludrocortisone 0.1 MG tablet Commonly known as:  FLORINEF Take 0.1 mg by mouth every morning.   HYDROcodone-acetaminophen 5-325 MG tablet Commonly known as:  NORCO/VICODIN Take 1 tablet by mouth 3 (three) times daily.  At 9 am , 1 pm , and 9 pm for pain   lactulose 10 GM/15ML solution Commonly known as:  CHRONULAC Take 15 mLs (10 g total) by mouth daily as needed for severe constipation.   lidocaine 5 % Commonly known as:  LIDODERM Place 1 patch onto the skin every 12 (twelve) hours as needed (for pain). Remove & Discard patch within 12 hours or as directed by MD   metoprolol tartrate 25 MG tablet Commonly known as:  LOPRESSOR Take 12.5 mg by mouth 2 (two) times daily.   nitroGLYCERIN 0.4 MG SL tablet Commonly known as:  NITROSTAT Place 0.4 mg under the tongue every 5 (five) minutes as needed for chest pain. May use 3 times   pantoprazole 40 MG tablet Commonly known as:  PROTONIX Take 1 tablet (40 mg total) by mouth daily at 6 (six) AM.   polyethylene glycol packet Commonly known as:  MIRALAX / GLYCOLAX Take 17 g by mouth daily.   QUEtiapine 200 MG 24 hr tablet Commonly known as:  SEROQUEL XR Take 200 mg by mouth. Take one tablet with 50 mg to equal 250 mg once daily   SEROQUEL XR 50 MG Tb24 24 hr tablet Generic drug:  QUEtiapine Take 50 mg by mouth. Take one tablet with 200 mg to equal 250 mg daily   ranitidine 150 MG tablet Commonly known as:  ZANTAC Take 150 mg by mouth. Take one tablet two times daily as needed for heartburn   senna 8.6 MG tablet Commonly known as:  SENOKOT Take 2 tablets by mouth 2 (two) times daily as needed for constipation.   travoprost (benzalkonium) 0.004 % ophthalmic solution Commonly known as:  TRAVATAN Place 1 drop into both eyes at bedtime.   Vitamin D 1000 units capsule Take 1,000 Units by mouth daily with breakfast.       No orders of the defined types were placed in this encounter.   Immunization History  Administered Date(s) Administered  . Influenza Split 11/13/2011  . Influenza-Unspecified 02/13/2008, 11/30/2013  . PPD Test 12/15/2015  . Pneumococcal-Unspecified 02/13/2008  . Td 02/12/2009    Social History  Substance Use Topics    . Smoking status: Former Smoker    Packs/day: 0.50    Types: Cigarettes  . Smokeless tobacco: Never Used  . Alcohol use No    Review of Systems  DATA OBTAINED: from patient, nurse GENERAL:  no fevers, fatigue, appetite changes SKIN: No itching, rash HEENT: Pain with swallowing as per history of present illness; pain of tongue RESPIRATORY: No cough, wheezing, SOB CARDIAC: No chest pain, palpitations, lower extremity edema  GI: No abdominal pain, No N/V/D or constipation, No heartburn or reflux  GU: No dysuria, frequency or urgency, or incontinence  MUSCULOSKELETAL: No unrelieved bone/joint pain NEUROLOGIC: No headache, dizziness  PSYCHIATRIC: No overt anxiety or sadness  Vitals:   11/28/16 1659  BP: (!) 134/94  Pulse: 64  Resp: 17  Temp: (!) 97.2 F (36.2 C)   Body mass index is 20.88 kg/m. Physical Exam  GENERAL APPEARANCE: Alert,  No acute distress  SKIN: No diaphoresis rash HEENT:Tongue with erythema and white coating RESPIRATORY: Breathing is even, unlabored. Lung sounds are clear   CARDIOVASCULAR: Heart RRR no murmurs, rubs or gallops. No peripheral edema  GASTROINTESTINAL: Abdomen is soft, non-tender, not distended w/ normal bowel sounds.  GENITOURINARY: Bladder non tender, not distended  MUSCULOSKELETAL: No abnormal joints or musculature NEUROLOGIC: Cranial nerves 2-12 grossly intact. Moves all extremities PSYCHIATRIC: Mood and affect with dementia, no behavioral issues  Patient Active Problem List   Diagnosis Date Noted  . Delusions ([place])   . Paranoia ([place])   . Vitamin D deficiency 08/25/2016  . Dementia without behavioral disturbance 03/25/2016  . S/P ORIF (open reduction internal fixation) fracture 01/08/2016  . Oral thrush 01/08/2016  . HTN (hypertension) 01/08/2016  . Closed hip fracture requiring operative repair, right, with routine healing, subsequent encounter 12/19/2015  . Bipolar disease, chronic ([place]) 12/17/2015  . Decubitus ulcer of hip,  stage 3 ([place]) 12/11/2015  . Ankle pain   . Encephalopathy   . Malnutrition of moderate degree 01/29/2015  . Altered mental status 01/26/2015  . Nausea and vomiting 01/17/2015  . Esophageal dysmotilities   . Urinary tract infectious disease   . Weakness 01/14/2015  . Schatzki's ring 01/14/2015  . CKD (chronic kidney disease), stage III ([place]) 01/14/2015  . Esophagus disorder   . Dysphagia 01/11/2015  . UTI (lower urinary tract infection) 03/30/2014  . Hearing loss, sensorineural 03/04/2014  . Nasal lesion 03/04/2014  .  Unspecified constipation 12/12/2012  . Hypokalemia 12/11/2012  . Orthostatic hypotension 12/11/2012  . Arterial tortuosity (aorta) 12/11/2012  . Protein-calorie malnutrition, severe ([name]) 12/10/2012  . Decreased rectal sphincter tone 11/21/2012  . Idiopathic scoliosis 07/08/2012  . Macular degeneration 06/26/2011  . Vision disturbance 06/26/2011  . Glaucoma 06/26/2011  . Anemia of chronic disease 04/17/2011  . Drug-seeking behavior 02/26/2011  . Osteoporosis 02/26/2011  . Depression   . Anxiety   . COPD (chronic obstructive pulmonary disease) ([place])   . GERD (gastroesophageal reflux disease)   . Migraines   . Insomnia, unspecified     CMP     Component Value Date/Time   NA 139 09/03/2016 2005   NA 145 06/11/2016   K 3.9 09/03/2016 2005   CL 105 09/03/2016 2005   CO2 25 09/03/2016 2005   GLUCOSE 117 (H) 09/03/2016 2005   BUN 22 (H) 09/03/2016 2005   BUN 20 06/11/2016   CREATININE 0.70 09/03/2016 2005   CREATININE 0.85 11/09/2013 1135   CALCIUM 9.3 09/03/2016 2005   PROT 7.0 09/03/2016 2005   ALBUMIN 3.7 09/03/2016 2005   AST 22 09/03/2016 2005   ALT 12 (L) 09/03/2016 2005   ALKPHOS 106 09/03/2016 2005   BILITOT 0.3 09/03/2016 2005   GFRNONAA >60 09/03/2016 2005   GFRNONAA 64 11/09/2013 1135   GFRAA >60 09/03/2016 2005   GFRAA 74 11/09/2013 1135    Recent Labs  12/15/15 0344  12/28/15 0431  12/29/15 0515 06/11/16 09/03/16 2005  NA 142  <  > 139  < > 139 145 139  K 3.9  < > 3.2*  --  3.8 4.6 3.9  CL 111  < > 106  --  107  --  105  CO2 23  < > 23  --  21*  --  25  GLUCOSE 95  < > 110*  --  114*  --  117*  BUN 15  < > 12  < > 10 20 22*  CREATININE 0.74  < > 0.60  < > 0.55 0.6 0.70  CALCIUM 8.9  < > 8.3*  --  8.5*  --  9.3  MG 1.5*  --   --   --   --   --   --   < > = values in this interval not displayed.  Recent Labs  12/13/15 0439 12/19/15 1717 06/11/16 09/03/16 2005  AST 21 61* 16 22  ALT 10* 46 9 12*  ALKPHOS 61 52 89 106  BILITOT 0.9 0.6  --  0.3  PROT 5.5* 6.4*  --  7.0  ALBUMIN 2.7* 2.9*  --  3.7    Recent Labs  12/13/15 0439  12/19/15 1717  12/26/15 0416  12/27/15 0346 05/22/16 09/03/16 2005  WBC 9.0  < > 7.8  < > 5.7  < > 5.7 9.6 8.0  NEUTROABS 6.9  --  5.4  --   --   --   --   --  4.5  HGB 10.8*  < > 10.9*  < > 8.3*  --  8.4* 12.0 11.3*  HCT 33.6*  < > 35.2*  < > 25.9*  --  27.1* 40 35.6*  MCV 93.3  < > 93.9  < > 89.0  --  88.6  --  77.6*  PLT 215  < > 285  < > 357  --  406* 235 320  < > = values in this interval not displayed.  Recent Labs  06/11/16  CHOL 190  LDLCALC 116  TRIG 101   No results found for: [facility] Lab Results  Component Value Date   TSH 2.61 06/11/2016   Lab Results  Component Value Date   HGBA1C 5.4 06/11/2016   Lab Results  Component Value Date   CHOL 190 06/11/2016   HDL 54 06/11/2016   LDLCALC 116 06/11/2016   TRIG 101 06/11/2016    Significant Diagnostic Results in last 30 days:  No results found.  Assessment and Plan  Oral pharyngeal thrush-plan Diflucan 100 mg 2 tabs by mouth day 1 and 1 tablet by mouth daily for the next 14 days; we'll monitor response     Webb Silversmith D. [name], MD

## 2016-11-29 ENCOUNTER — Encounter: Payer: Self-pay | Admitting: Internal Medicine

## 2016-11-29 NOTE — Assessment & Plan Note (Signed)
No reported exacerbation; continue when necessary albuterol

## 2016-11-29 NOTE — Assessment & Plan Note (Signed)
Controlled; patient now only on Lopressor 25 mg by mouth twice a day; plan to continue

## 2016-11-29 NOTE — Assessment & Plan Note (Signed)
No reported reflux or aspiration; continue Protonix 40 mg by mouth daily

## 2016-12-03 ENCOUNTER — Encounter: Payer: Self-pay | Admitting: Internal Medicine

## 2016-12-03 DIAGNOSIS — R05 Cough: Secondary | ICD-10-CM | POA: Diagnosis not present

## 2016-12-03 DIAGNOSIS — F0390 Unspecified dementia without behavioral disturbance: Secondary | ICD-10-CM | POA: Diagnosis not present

## 2016-12-03 DIAGNOSIS — F39 Unspecified mood [affective] disorder: Secondary | ICD-10-CM | POA: Diagnosis not present

## 2016-12-03 DIAGNOSIS — R440 Auditory hallucinations: Secondary | ICD-10-CM | POA: Diagnosis not present

## 2016-12-03 DIAGNOSIS — R441 Visual hallucinations: Secondary | ICD-10-CM | POA: Diagnosis not present

## 2016-12-03 NOTE — Progress Notes (Signed)
Location:  [place] Room Number: [place]:  SNF [phone])  [name], MD  Patient Care Team: [name], MD as PCP - General (Internal Medicine) [name], MD (Gastroenterology) [name], NP (Obstetrics and Gynecology)  Extended Emergency Contact Information Primary Emergency Contact: [name],[name] Address: [place], [place] Phone: [phone] Mobile Phone: [phone] Relation: Daughter Secondary Emergency Contact: [name] Phone: [phone] Mobile Phone: [phone] Relation: Daughter    Allergies: Macrobid [nitrofurantoin macrocrystal]; Azithromycin; Doxycycline; Escitalopram oxalate; Fioricet [butalbital-apap-caffeine]; Latex; Levofloxacin; Nsaids; Oxycodone; Restasis [cyclosporine]; Sulfa antibiotics; and Biaxin [clarithromycin]  Chief Complaint  Patient presents with  . Acute Visit    low blood pressure 86/60    HPI: Patient is 83 y.o. female who   Past Medical History:  Diagnosis Date  . Anxiety   . Arterial tortuosity (aorta) 12/11/2012  . Asthma   . Chronic kidney disease, stage IV (severe) ([place]) 12/11/2012  . Chronic mental illness   . CKD (chronic kidney disease), stage III ([place]) 01/14/2015  . Closed hip fracture requiring operative repair, right, with routine healing, subsequent encounter 12/19/2015  . Colitis   . COPD (chronic obstructive pulmonary disease) ([place])   . Decreased rectal sphincter tone 11/21/2012  . Depression   . Drug-seeking behavior 02/26/2011   Use of multiple md to obtain benzos/narcotics pt must sign contract to receive controlled meds.   Marland Kitchen Dysphagia 01/11/2015  . GERD (gastroesophageal reflux disease)   . Glaucoma 06/26/2011  . Glaucoma of both eyes   . Hearing loss, sensorineural 03/04/2014   [facility]; 02/26/14. There is not a medical or surgical treatment  that would benefit her type of hearing loss. We discussed amplification in an overview fashion.    Marland Kitchen History of fall   . Insomnia, unspecified   . Macular degeneration 06/26/2011  . Migraines   . Nasal bone fracture 03/30/2014  . Osteoporosis   . Osteoporosis 02/26/2011  . Presence of right artificial hip joint   . Protein-calorie malnutrition, severe ([place]) 12/10/2012  . S/P ORIF (open reduction internal fixation) fracture 01/08/2016  . Schatzki's ring 01/14/2015  . Scoliosis   . Substance abuse ([place])    Benbzos and Narcotics, she does not get any of those medicines from [facility], we have empathetically told her that.  . Vertigo   . Vitamin B deficiency   . Vitamin D deficiency 08/25/2016  . Weight loss     Past Surgical History:  Procedure Laterality Date  . ABDOMINAL HYSTERECTOMY    . CATARACT EXTRACTION    . CHOLECYSTECTOMY    . ESOPHAGOGASTRODUODENOSCOPY (EGD) WITH PROPOFOL N/A 01/13/2015   Procedure: ESOPHAGOGASTRODUODENOSCOPY (EGD) WITH PROPOFOL;  Surgeon: [name], MD;  Location: [facility];  Service: Endoscopy;  Laterality: N/A;  . HIP ARTHROPLASTY Right 12/21/2015   Procedure: ARTHROPLASTY BIPOLAR HIP (HEMIARTHROPLASTY);  Surgeon: [name], MD;  Location: [facility] [place];  Service: Orthopedics;  Laterality: Right;  . PARTIAL HYSTERECTOMY      Allergies as of 12/03/2016      Reactions   Macrobid [nitrofurantoin Macrocrystal] Rash   Azithromycin Other (See Comments)   unknown   Doxycycline Other (See Comments)   unknown   Escitalopram Oxalate Other (See Comments)   unknown   Fioricet [butalbital-apap-caffeine] Other (See Comments)   Drunk.   Latex Other (See Comments)   unknown  Levofloxacin Other (See Comments)   unknown   Nsaids Other (See Comments)   This is not an allergy. The patient was told by Dr. Rockne Menghini to avoid NSAIDs and stop Vicoprofen in to 2014 to avoid nephrotoxicity.  Allergic reaction not listed on MAR.   Oxycodone Other (See Comments)    reaction to synthetic codeine   Restasis [cyclosporine] Other (See Comments)   unknown   Sulfa Antibiotics Other (See Comments)   Reaction unknown   Biaxin [clarithromycin] Rash   With burning sensation      Medication List       Accurate as of 12/03/16  4:58 PM. Always use your most recent med list.          acetaminophen 325 MG tablet Commonly known as:  TYLENOL Take 650 mg by mouth every 6 (six) hours as needed for headache.   albuterol 108 (90 Base) MCG/ACT inhaler Commonly known as:  PROVENTIL HFA;VENTOLIN HFA Inhale 1 puff into the lungs every 6 (six) hours as needed for shortness of breath.   ASPERCREME 10 % Lotn Generic drug:  Trolamine Salicylate Apply 1 application topically 2 (two) times daily as needed (for painful areas).   Biotin 5000 MCG Caps Take 1 capsule by mouth daily.   clonazePAM 0.5 MG tablet Commonly known as:  KLONOPIN Take 0.5 mg by mouth 2 (two) times daily.   Cranberry 425 MG Caps Take 425 mg by mouth 2 (two) times daily.   docusate sodium 100 MG capsule Commonly known as:  COLACE Take 100 mg by mouth 2 (two) times daily.   dorzolamide-timolol 22.3-6.8 MG/ML ophthalmic solution Commonly known as:  COSOPT Place 1 drop into both eyes 2 (two) times daily.   feeding supplement Liqd Take 1 Container by mouth 2 (two) times daily between meals.   NUTRITIONAL SUPPLEMENT Liqd Take by mouth. Med Pass 8 oz twice daily to support weight status   ferrous sulfate 325 (65 FE) MG tablet Take 325 mg by mouth. Take one tablet daily   fludrocortisone 0.1 MG tablet Commonly known as:  FLORINEF Take 0.1 mg by mouth every morning.   HYDROcodone-acetaminophen 5-325 MG tablet Commonly known as:  NORCO/VICODIN Take 1 tablet by mouth 3 (three) times daily. At 9 am , 1 pm , and 9 pm for pain   lactulose 10 GM/15ML solution Commonly known as:  CHRONULAC Take 15 mLs (10 g total) by mouth daily as needed for severe constipation.   lidocaine 5  % Commonly known as:  LIDODERM Place 1 patch onto the skin every 12 (twelve) hours as needed (for pain). Remove & Discard patch within 12 hours or as directed by MD   metoprolol tartrate 25 MG tablet Commonly known as:  LOPRESSOR Take 12.5 mg by mouth 2 (two) times daily.   nitroGLYCERIN 0.4 MG SL tablet Commonly known as:  NITROSTAT Place 0.4 mg under the tongue every 5 (five) minutes as needed for chest pain. May use 3 times   pantoprazole 40 MG tablet Commonly known as:  PROTONIX Take 1 tablet (40 mg total) by mouth daily at 6 (six) AM.   polyethylene glycol packet Commonly known as:  MIRALAX / GLYCOLAX Take 17 g by mouth daily.   QUEtiapine 200 MG 24 hr tablet Commonly known as:  SEROQUEL XR Take 200 mg by mouth. Take one tablet with 50 mg to equal 250 mg once daily   SEROQUEL XR 50 MG Tb24 24 hr tablet Generic drug:  QUEtiapine Take 50 mg by  mouth. Take one tablet with 200 mg to equal 250 mg daily   ranitidine 150 MG tablet Commonly known as:  ZANTAC Take 150 mg by mouth. Take one tablet two times daily as needed for heartburn   senna 8.6 MG tablet Commonly known as:  SENOKOT Take 2 tablets by mouth 2 (two) times daily as needed for constipation.   travoprost (benzalkonium) 0.004 % ophthalmic solution Commonly known as:  TRAVATAN Place 1 drop into both eyes at bedtime.   Vitamin D 1000 units capsule Take 1,000 Units by mouth daily with breakfast.       No orders of the defined types were placed in this encounter.   Immunization History  Administered Date(s) Administered  . Influenza Split 11/13/2011  . Influenza-Unspecified 02/13/2008, 11/30/2013  . PPD Test 12/15/2015  . Pneumococcal-Unspecified 02/13/2008  . Td 02/12/2009    Social History  Substance Use Topics  . Smoking status: Former Smoker    Packs/day: 0.50    Types: Cigarettes  . Smokeless tobacco: Never Used  . Alcohol use No    Review of Systems  DATA OBTAINED: from patient, nurse,  medical record, family member GENERAL:  no fevers, fatigue, appetite changes SKIN: No itching, rash HEENT: No complaint RESPIRATORY: No cough, wheezing, SOB CARDIAC: No chest pain, palpitations, lower extremity edema  GI: No abdominal pain, No N/V/D or constipation, No heartburn or reflux  GU: No dysuria, frequency or urgency, or incontinence  MUSCULOSKELETAL: No unrelieved bone/joint pain NEUROLOGIC: No headache, dizziness  PSYCHIATRIC: No overt anxiety or sadness  Vitals:   12/03/16 1650  BP: (!) 86/60  Pulse: 81  Resp: 14  Temp: 97.9 F (36.6 C)  SpO2: 93%   Body mass index is 20.8 kg/m. Physical Exam  GENERAL APPEARANCE: Alert, conversant, No acute distress  SKIN: No diaphoresis rash HEENT: Unremarkable RESPIRATORY: Breathing is even, unlabored. Lung sounds are clear   CARDIOVASCULAR: Heart RRR no murmurs, rubs or gallops. No peripheral edema  GASTROINTESTINAL: Abdomen is soft, non-tender, not distended w/ normal bowel sounds.  GENITOURINARY: Bladder non tender, not distended  MUSCULOSKELETAL: No abnormal joints or musculature NEUROLOGIC: Cranial nerves 2-12 grossly intact. Moves all extremities PSYCHIATRIC: Mood and affect appropriate to situation, no behavioral issues  Patient Active Problem List   Diagnosis Date Noted  . Delusions ([place])   . Paranoia ([place])   . Vitamin D deficiency 08/25/2016  . Dementia without behavioral disturbance 03/25/2016  . S/P ORIF (open reduction internal fixation) fracture 01/08/2016  . Oral thrush 01/08/2016  . HTN (hypertension) 01/08/2016  . Closed hip fracture requiring operative repair, right, with routine healing, subsequent encounter 12/19/2015  . Bipolar disease, chronic ([place]) 12/17/2015  . Decubitus ulcer of hip, stage 3 ([place]) 12/11/2015  . Ankle pain   . Encephalopathy   . Malnutrition of moderate degree 01/29/2015  . Altered mental status 01/26/2015  . Nausea and vomiting 01/17/2015  . Esophageal dysmotilities   .  Urinary tract infectious disease   . Weakness 01/14/2015  . Schatzki's ring 01/14/2015  . CKD (chronic kidney disease), stage III ([place]) 01/14/2015  . Esophagus disorder   . Dysphagia 01/11/2015  . UTI (lower urinary tract infection) 03/30/2014  . Hearing loss, sensorineural 03/04/2014  . Nasal lesion 03/04/2014  . Unspecified constipation 12/12/2012  . Hypokalemia 12/11/2012  . Orthostatic hypotension 12/11/2012  . Arterial tortuosity (aorta) 12/11/2012  . Protein-calorie malnutrition, severe ([place]) 12/10/2012  . Decreased rectal sphincter tone 11/21/2012  . Idiopathic scoliosis 07/08/2012  . Macular degeneration 06/26/2011  .  Vision disturbance 06/26/2011  . Glaucoma 06/26/2011  . Anemia of chronic disease 04/17/2011  . Drug-seeking behavior 02/26/2011  . Osteoporosis 02/26/2011  . Depression   . Anxiety   . COPD (chronic obstructive pulmonary disease) ([place])   . GERD (gastroesophageal reflux disease)   . Migraines   . Insomnia, unspecified     CMP     Component Value Date/Time   NA 139 09/03/2016 2005   NA 145 06/11/2016   K 3.9 09/03/2016 2005   CL 105 09/03/2016 2005   CO2 25 09/03/2016 2005   GLUCOSE 117 (H) 09/03/2016 2005   BUN 22 (H) 09/03/2016 2005   BUN 20 06/11/2016   CREATININE 0.70 09/03/2016 2005   CREATININE 0.85 11/09/2013 1135   CALCIUM 9.3 09/03/2016 2005   PROT 7.0 09/03/2016 2005   ALBUMIN 3.7 09/03/2016 2005   AST 22 09/03/2016 2005   ALT 12 (L) 09/03/2016 2005   ALKPHOS 106 09/03/2016 2005   BILITOT 0.3 09/03/2016 2005   GFRNONAA >60 09/03/2016 2005   GFRNONAA 64 11/09/2013 1135   GFRAA >60 09/03/2016 2005   GFRAA 74 11/09/2013 1135    Recent Labs  12/15/15 0344  12/28/15 0431  12/29/15 0515 06/11/16 09/03/16 2005  NA 142  < > 139  < > 139 145 139  K 3.9  < > 3.2*  --  3.8 4.6 3.9  CL 111  < > 106  --  107  --  105  CO2 23  < > 23  --  21*  --  25  GLUCOSE 95  < > 110*  --  114*  --  117*  BUN 15  < > 12  < > 10 20 22*    CREATININE 0.74  < > 0.60  < > 0.55 0.6 0.70  CALCIUM 8.9  < > 8.3*  --  8.5*  --  9.3  MG 1.5*  --   --   --   --   --   --   < > = values in this interval not displayed.  Recent Labs  12/13/15 0439 12/19/15 1717 06/11/16 09/03/16 2005  AST 21 61* 16 22  ALT 10* 46 9 12*  ALKPHOS 61 52 89 106  BILITOT 0.9 0.6  --  0.3  PROT 5.5* 6.4*  --  7.0  ALBUMIN 2.7* 2.9*  --  3.7    Recent Labs  12/13/15 0439  12/19/15 1717  12/26/15 0416  12/27/15 0346 05/22/16 09/03/16 2005  WBC 9.0  < > 7.8  < > 5.7  < > 5.7 9.6 8.0  NEUTROABS 6.9  --  5.4  --   --   --   --   --  4.5  HGB 10.8*  < > 10.9*  < > 8.3*  --  8.4* 12.0 11.3*  HCT 33.6*  < > 35.2*  < > 25.9*  --  27.1* 40 35.6*  MCV 93.3  < > 93.9  < > 89.0  --  88.6  --  77.6*  PLT 215  < > 285  < > 357  --  406* 235 320  < > = values in this interval not displayed.  Recent Labs  06/11/16  CHOL 190  LDLCALC 116  TRIG 101   No results found for: [facility] Lab Results  Component Value Date   TSH 2.61 06/11/2016   Lab Results  Component Value Date   HGBA1C 5.4 06/11/2016   Lab Results  Component  Value Date   CHOL 190 06/11/2016   HDL 54 06/11/2016   LDLCALC 116 06/11/2016   TRIG 101 06/11/2016    Significant Diagnostic Results in last 30 days:  No results found.  Assessment and Plan  No problem-specific Assessment & Plan notes found for this encounter.   Labs/tests ordered:    [name], MD

## 2016-12-04 DIAGNOSIS — D649 Anemia, unspecified: Secondary | ICD-10-CM | POA: Diagnosis not present

## 2016-12-04 DIAGNOSIS — J449 Chronic obstructive pulmonary disease, unspecified: Secondary | ICD-10-CM | POA: Diagnosis not present

## 2016-12-07 DIAGNOSIS — N390 Urinary tract infection, site not specified: Secondary | ICD-10-CM | POA: Diagnosis not present

## 2016-12-07 DIAGNOSIS — Z79899 Other long term (current) drug therapy: Secondary | ICD-10-CM | POA: Diagnosis not present

## 2016-12-07 DIAGNOSIS — R319 Hematuria, unspecified: Secondary | ICD-10-CM | POA: Diagnosis not present

## 2016-12-10 ENCOUNTER — Non-Acute Institutional Stay (SKILLED_NURSING_FACILITY): Payer: Medicare Other | Admitting: Internal Medicine

## 2016-12-10 ENCOUNTER — Encounter: Payer: Self-pay | Admitting: Internal Medicine

## 2016-12-10 DIAGNOSIS — B952 Enterococcus as the cause of diseases classified elsewhere: Secondary | ICD-10-CM | POA: Diagnosis not present

## 2016-12-10 DIAGNOSIS — N390 Urinary tract infection, site not specified: Secondary | ICD-10-CM

## 2016-12-10 DIAGNOSIS — B9620 Unspecified Escherichia coli [E. coli] as the cause of diseases classified elsewhere: Secondary | ICD-10-CM

## 2016-12-10 NOTE — Progress Notes (Signed)
Location:  [facility] Room Number: 195K Place of Service:  SNF [id number])  [name], MD  Patient Care Team: [name], MD as PCP - General (Internal Medicine) [name], MD (Gastroenterology) [name], NP (Obstetrics and Gynecology)  Extended Emergency Contact Information Primary Emergency Contact: [name],[name] Address: [place], [place] Phone: [phone] Mobile Phone: [phone] Relation: Daughter Secondary Emergency Contact: [name] Phone: [phone] Mobile Phone: [phone] Relation: Daughter    Allergies: Macrobid [nitrofurantoin macrocrystal]; Azithromycin; Doxycycline; Escitalopram oxalate; Fioricet [butalbital-apap-caffeine]; Latex; Levofloxacin; Nsaids; Oxycodone; Restasis [cyclosporine]; Sulfa antibiotics; and Biaxin [clarithromycin]  Chief Complaint  Patient presents with  . Acute Visit    UTI    HPI: Patient is 83 y.o. female who who is being seen today because her urinalysis came back with greater than 100,000 Escherichia coli an greater than 50,000 Enterococcus faecalis.several days ago she did have some urinary complaints and a change in her mental status. It is not possible to give much history from her today,her bladder may be mildly tender to palpation. Nurses report no fever, patient is drinking okay, eating a little less.  Past Medical History:  Diagnosis Date  . Anxiety   . Arterial tortuosity (aorta) 12/11/2012  . Asthma   . Chronic kidney disease, stage IV (severe) ([place]) 12/11/2012  . Chronic mental illness   . CKD (chronic kidney disease), stage III ([place]) 01/14/2015  . Closed hip fracture requiring operative repair, right, with routine healing, subsequent encounter 12/19/2015  . Colitis   . COPD (chronic obstructive pulmonary disease) ([place])   . Decreased rectal sphincter tone 11/21/2012  .  Depression   . Drug-seeking behavior 02/26/2011   Use of multiple md to obtain benzos/narcotics pt must sign contract to receive controlled meds.   Marland Kitchen Dysphagia 01/11/2015  . GERD (gastroesophageal reflux disease)   . Glaucoma 06/26/2011  . Glaucoma of both eyes   . Hearing loss, sensorineural 03/04/2014   [facility]; 02/26/14. There is not a medical or surgical treatment that would benefit her type of hearing loss. We discussed amplification in an overview fashion.    Marland Kitchen History of fall   . Insomnia, unspecified   . Macular degeneration 06/26/2011  . Migraines   . Nasal bone fracture 03/30/2014  . Osteoporosis   . Osteoporosis 02/26/2011  . Presence of right artificial hip joint   . Protein-calorie malnutrition, severe ([place]) 12/10/2012  . S/P ORIF (open reduction internal fixation) fracture 01/08/2016  . Schatzki's ring 01/14/2015  . Scoliosis   . Substance abuse ([place])    Benbzos and Narcotics, she does not get any of those medicines from [facility], we have empathetically told her that.  . Vertigo   . Vitamin B deficiency   . Vitamin D deficiency 08/25/2016  . Weight loss     Past Surgical History:  Procedure Laterality Date  . ABDOMINAL HYSTERECTOMY    . CATARACT EXTRACTION    . CHOLECYSTECTOMY    . ESOPHAGOGASTRODUODENOSCOPY (EGD) WITH PROPOFOL N/A 01/13/2015   Procedure: ESOPHAGOGASTRODUODENOSCOPY (EGD) WITH PROPOFOL;  Surgeon: [name], MD;  Location: [facility];  Service: Endoscopy;  Laterality: N/A;  . HIP ARTHROPLASTY Right 12/21/2015   Procedure: ARTHROPLASTY BIPOLAR HIP (HEMIARTHROPLASTY);  Surgeon: [name], MD;  Location: [facility];  Service: Orthopedics;  Laterality: Right;  . PARTIAL HYSTERECTOMY      Allergies  as of 12/10/2016      Reactions   Macrobid [nitrofurantoin Macrocrystal] Rash   Azithromycin Other (See Comments)   unknown   Doxycycline Other (See Comments)   unknown   Escitalopram Oxalate Other (See Comments)   unknown   Fioricet  [butalbital-apap-caffeine] Other (See Comments)   Drunk.   Latex Other (See Comments)   unknown   Levofloxacin Other (See Comments)   unknown   Nsaids Other (See Comments)   This is not an allergy. The patient was told by Dr. Rockne Menghini to avoid NSAIDs and stop Vicoprofen in to 2014 to avoid nephrotoxicity.  Allergic reaction not listed on MAR.   Oxycodone Other (See Comments)   reaction to synthetic codeine   Restasis [cyclosporine] Other (See Comments)   unknown   Sulfa Antibiotics Other (See Comments)   Reaction unknown   Biaxin [clarithromycin] Rash   With burning sensation      Medication List       Accurate as of 12/10/16  5:10 PM. Always use your most recent med list.          acetaminophen 325 MG tablet Commonly known as:  TYLENOL Take 650 mg by mouth every 6 (six) hours as needed for headache.   albuterol 108 (90 Base) MCG/ACT inhaler Commonly known as:  PROVENTIL HFA;VENTOLIN HFA Inhale 1 puff into the lungs every 6 (six) hours as needed for shortness of breath.   ASPERCREME 10 % Lotn Generic drug:  Trolamine Salicylate Apply 1 application topically 2 (two) times daily as needed (for painful areas).   Biotin 5000 MCG Caps Take 1 capsule by mouth daily.   cefTRIAXone 1 g injection Commonly known as:  ROCEPHIN Inject 1 g into the muscle. Inject one gram IM for seven days for e coli UTI. Stop 12/17/16   clonazePAM 0.5 MG tablet Commonly known as:  KLONOPIN Take 0.5 mg by mouth 2 (two) times daily.   Cranberry 425 MG Caps Take 425 mg by mouth 2 (two) times daily.   docusate sodium 100 MG capsule Commonly known as:  COLACE Take 100 mg by mouth 2 (two) times daily.   dorzolamide-timolol 22.3-6.8 MG/ML ophthalmic solution Commonly known as:  COSOPT Place 1 drop into both eyes 2 (two) times daily.   feeding supplement Liqd Take 1 Container by mouth 2 (two) times daily between meals.   NUTRITIONAL SUPPLEMENT Liqd Take by mouth. Med Pass 8 oz twice daily to  support weight status   ferrous sulfate 325 (65 FE) MG tablet Take 325 mg by mouth. Take one tablet daily   fluconazole 100 MG tablet Commonly known as:  DIFLUCAN Take 100 mg by mouth. Take one daily for 14 days stop 12/13/16   fludrocortisone 0.1 MG tablet Commonly known as:  FLORINEF Take 0.1 mg by mouth every morning.   HYDROcodone-acetaminophen 5-325 MG tablet Commonly known as:  NORCO/VICODIN Take 1 tablet by mouth 3 (three) times daily. At 9 am , 1 pm , and 9 pm for pain   lactulose 10 GM/15ML solution Commonly known as:  CHRONULAC Take 15 mLs (10 g total) by mouth daily as needed for severe constipation.   lidocaine 5 % Commonly known as:  LIDODERM Place 1 patch onto the skin every 12 (twelve) hours as needed (for pain). Remove & Discard patch within 12 hours or as directed by MD   metoprolol tartrate 25 MG tablet Commonly known as:  LOPRESSOR Take 12.5 mg by mouth 2 (two) times daily.   nitroGLYCERIN 0.4  MG SL tablet Commonly known as:  NITROSTAT Place 0.4 mg under the tongue every 5 (five) minutes as needed for chest pain. May use 3 times   pantoprazole 40 MG tablet Commonly known as:  PROTONIX Take 1 tablet (40 mg total) by mouth daily at 6 (six) AM.   polyethylene glycol packet Commonly known as:  MIRALAX / GLYCOLAX Take 17 g by mouth daily.   QUEtiapine 200 MG 24 hr tablet Commonly known as:  SEROQUEL XR Take 200 mg by mouth. Take one tablet with 50 mg to equal 250 mg once daily   SEROQUEL XR 50 MG Tb24 24 hr tablet Generic drug:  QUEtiapine Take 50 mg by mouth. Take one tablet with 200 mg to equal 250 mg daily   ranitidine 150 MG tablet Commonly known as:  ZANTAC Take 150 mg by mouth. Take one tablet two times daily as needed for heartburn   senna 8.6 MG tablet Commonly known as:  SENOKOT Take 2 tablets by mouth 2 (two) times daily as needed for constipation.   travoprost (benzalkonium) 0.004 % ophthalmic solution Commonly known as:   TRAVATAN Place 1 drop into both eyes at bedtime.   Vitamin D 1000 units capsule Take 1,000 Units by mouth daily with breakfast.       Meds ordered this encounter  Medications  . cefTRIAXone (ROCEPHIN) 1 g injection    Sig: Inject 1 g into the muscle. Inject one gram IM for seven days for e coli UTI. Stop 12/17/16  . fluconazole (DIFLUCAN) 100 MG tablet    Sig: Take 100 mg by mouth. Take one daily for 14 days stop 12/13/16    Immunization History  Administered Date(s) Administered  . Influenza Split 11/13/2011  . Influenza-Unspecified 02/13/2008, 11/30/2013  . PPD Test 12/15/2015  . Pneumococcal-Unspecified 02/13/2008  . Td 02/12/2009    Social History  Substance Use Topics  . Smoking status: Former Smoker    Packs/day: 0.50    Types: Cigarettes  . Smokeless tobacco: Never Used  . Alcohol use No    Review of Systems  DATA OBTAINED: from nurse-per history of present illness GENERAL:  no fevers, fatigue, drinking fine, slight decrease in food intake SKIN: No itching, rash HEENT: No complaint RESPIRATORY: No cough, wheezing, SOB CARDIAC: No chest pain, palpitations, lower extremity edema  GI: No abdominal pain, No N/V/D or constipation, No heartburn or reflux  GU: No dysuria, frequency or urgency, or incontinence  MUSCULOSKELETAL: No unrelieved bone/joint pain NEUROLOGIC: No headache, dizziness  PSYCHIATRIC: No overt anxiety or sadness  Vitals:   12/10/16 1703  BP: 114/83  Pulse: 96  Resp: 18  Temp: 98.1 F (36.7 C)   Body mass index is 20.8 kg/m. Physical Exam  GENERAL APPEARANCE: Alert, No acute distress  SKIN: No diaphoresis rash HEENT: Unremarkable RESPIRATORY: Breathing is even, unlabored. Lung sounds are clear   CARDIOVASCULAR: Heart RRR no murmurs, rubs or gallops. No peripheral edema  GASTROINTESTINAL: Abdomen is soft, non-tender, not distended w/ normal bowel sounds.  GENITOURINARY: Bladder mild tender, not distended  MUSCULOSKELETAL: No abnormal  joints or musculature NEUROLOGIC: Cranial nerves 2-12 grossly intact. Moves all extremities PSYCHIATRIC: Mood and affect with dementia, no behavioral issues  Patient Active Problem List   Diagnosis Date Noted  . Delusions ([place])   . Paranoia ([place])   . Vitamin D deficiency 08/25/2016  . Dementia without behavioral disturbance 03/25/2016  . S/P ORIF (open reduction internal fixation) fracture 01/08/2016  . Oral thrush 01/08/2016  . HTN (  hypertension) 01/08/2016  . Closed hip fracture requiring operative repair, right, with routine healing, subsequent encounter 12/19/2015  . Bipolar disease, chronic ([place]) 12/17/2015  . Decubitus ulcer of hip, stage 3 ([place]) 12/11/2015  . Ankle pain   . Encephalopathy   . Malnutrition of moderate degree 01/29/2015  . Altered mental status 01/26/2015  . Nausea and vomiting 01/17/2015  . Esophageal dysmotilities   . Urinary tract infectious disease   . Weakness 01/14/2015  . Schatzki's ring 01/14/2015  . CKD (chronic kidney disease), stage III ([place]) 01/14/2015  . Esophagus disorder   . Dysphagia 01/11/2015  . UTI (lower urinary tract infection) 03/30/2014  . Hearing loss, sensorineural 03/04/2014  . Nasal lesion 03/04/2014  . Unspecified constipation 12/12/2012  . Hypokalemia 12/11/2012  . Orthostatic hypotension 12/11/2012  . Arterial tortuosity (aorta) 12/11/2012  . Protein-calorie malnutrition, severe ([place]) 12/10/2012  . Decreased rectal sphincter tone 11/21/2012  . Idiopathic scoliosis 07/08/2012  . Macular degeneration 06/26/2011  . Vision disturbance 06/26/2011  . Glaucoma 06/26/2011  . Anemia of chronic disease 04/17/2011  . Drug-seeking behavior 02/26/2011  . Osteoporosis 02/26/2011  . Depression   . Anxiety   . COPD (chronic obstructive pulmonary disease) ([place])   . GERD (gastroesophageal reflux disease)   . Migraines   . Insomnia, unspecified     CMP     Component Value Date/Time   NA 139 09/03/2016 2005   NA 145 06/11/2016    K 3.9 09/03/2016 2005   CL 105 09/03/2016 2005   CO2 25 09/03/2016 2005   GLUCOSE 117 (H) 09/03/2016 2005   BUN 22 (H) 09/03/2016 2005   BUN 20 06/11/2016   CREATININE 0.70 09/03/2016 2005   CREATININE 0.85 11/09/2013 1135   CALCIUM 9.3 09/03/2016 2005   PROT 7.0 09/03/2016 2005   ALBUMIN 3.7 09/03/2016 2005   AST 22 09/03/2016 2005   ALT 12 (L) 09/03/2016 2005   ALKPHOS 106 09/03/2016 2005   BILITOT 0.3 09/03/2016 2005   GFRNONAA >60 09/03/2016 2005   GFRNONAA 64 11/09/2013 1135   GFRAA >60 09/03/2016 2005   GFRAA 74 11/09/2013 1135    Recent Labs  12/15/15 0344  12/28/15 0431  12/29/15 0515 06/11/16 09/03/16 2005  NA 142  < > 139  < > 139 145 139  K 3.9  < > 3.2*  --  3.8 4.6 3.9  CL 111  < > 106  --  107  --  105  CO2 23  < > 23  --  21*  --  25  GLUCOSE 95  < > 110*  --  114*  --  117*  BUN 15  < > 12  < > 10 20 22*  CREATININE 0.74  < > 0.60  < > 0.55 0.6 0.70  CALCIUM 8.9  < > 8.3*  --  8.5*  --  9.3  MG 1.5*  --   --   --   --   --   --   < > = values in this interval not displayed.  Recent Labs  12/13/15 0439 12/19/15 1717 06/11/16 09/03/16 2005  AST 21 61* 16 22  ALT 10* 46 9 12*  ALKPHOS 61 52 89 106  BILITOT 0.9 0.6  --  0.3  PROT 5.5* 6.4*  --  7.0  ALBUMIN 2.7* 2.9*  --  3.7    Recent Labs  12/13/15 0439  12/19/15 1717  12/26/15 0416  12/27/15 0346 05/22/16 09/03/16 2005  WBC 9.0  < >  7.8  < > 5.7  < > 5.7 9.6 8.0  NEUTROABS 6.9  --  5.4  --   --   --   --   --  4.5  HGB 10.8*  < > 10.9*  < > 8.3*  --  8.4* 12.0 11.3*  HCT 33.6*  < > 35.2*  < > 25.9*  --  27.1* 40 35.6*  MCV 93.3  < > 93.9  < > 89.0  --  88.6  --  77.6*  PLT 215  < > 285  < > 357  --  406* 235 320  < > = values in this interval not displayed.  Recent Labs  06/11/16  CHOL 190  LDLCALC 116  TRIG 101   No results found for: [facility] Lab Results  Component Value Date   TSH 2.61 06/11/2016   Lab Results  Component Value Date   HGBA1C 5.4 06/11/2016   Lab  Results  Component Value Date   CHOL 190 06/11/2016   HDL 54 06/11/2016   LDLCALC 116 06/11/2016   TRIG 101 06/11/2016    Significant Diagnostic Results in last 30 days:  No results found.  Assessment and Plan-  ESCHERICHIA COLI UTI/ENTEROCOCCUS UTI- there are 2 difficulties here-#1  patient is allergic to multiple antibiotics including Macrobid Z-Pak Biaxin doxycycline and Levaquin and sulfa  #2 there is no overlap in the sensitivities of these 2 organisms; therefore we'll need to used to antibiotics;for Escherichia coli use Rocephin 1 g IM 7 days, patient's creatinine clearance is 49.64 calculated  for enterococcus will be using ampicillin 500 mg every 8 hours for 7 days;patient is still drinking massive fluid intake is not a problem at this moment; will continue to monitor   Jama Mcmiller D. [name], MD

## 2016-12-11 ENCOUNTER — Non-Acute Institutional Stay (SKILLED_NURSING_FACILITY): Payer: Medicare Other | Admitting: Internal Medicine

## 2016-12-11 DIAGNOSIS — Z880 Allergy status to penicillin: Secondary | ICD-10-CM | POA: Diagnosis not present

## 2016-12-11 DIAGNOSIS — B952 Enterococcus as the cause of diseases classified elsewhere: Secondary | ICD-10-CM

## 2016-12-12 ENCOUNTER — Encounter: Payer: Self-pay | Admitting: Internal Medicine

## 2016-12-12 ENCOUNTER — Encounter (HOSPITAL_COMMUNITY): Payer: Self-pay | Admitting: Emergency Medicine

## 2016-12-12 ENCOUNTER — Emergency Department (HOSPITAL_COMMUNITY)
Admission: EM | Admit: 2016-12-12 | Discharge: 2016-12-13 | Disposition: A | Discharge status: Home / Self Care | Payer: Medicare Other | Attending: Emergency Medicine | Admitting: Emergency Medicine

## 2016-12-12 ENCOUNTER — Emergency Department (HOSPITAL_COMMUNITY): Payer: Medicare Other

## 2016-12-12 DIAGNOSIS — Z96641 Presence of right artificial hip joint: Secondary | ICD-10-CM | POA: Insufficient documentation

## 2016-12-12 DIAGNOSIS — J449 Chronic obstructive pulmonary disease, unspecified: Secondary | ICD-10-CM | POA: Diagnosis not present

## 2016-12-12 DIAGNOSIS — E860 Dehydration: Secondary | ICD-10-CM

## 2016-12-12 DIAGNOSIS — Z87891 Personal history of nicotine dependence: Secondary | ICD-10-CM | POA: Diagnosis not present

## 2016-12-12 DIAGNOSIS — I129 Hypertensive chronic kidney disease with stage 1 through stage 4 chronic kidney disease, or unspecified chronic kidney disease: Secondary | ICD-10-CM | POA: Insufficient documentation

## 2016-12-12 DIAGNOSIS — R4182 Altered mental status, unspecified: Secondary | ICD-10-CM | POA: Diagnosis present

## 2016-12-12 DIAGNOSIS — R9431 Abnormal electrocardiogram [ECG] [EKG]: Secondary | ICD-10-CM | POA: Diagnosis not present

## 2016-12-12 DIAGNOSIS — J45909 Unspecified asthma, uncomplicated: Secondary | ICD-10-CM | POA: Insufficient documentation

## 2016-12-12 DIAGNOSIS — N184 Chronic kidney disease, stage 4 (severe): Secondary | ICD-10-CM | POA: Insufficient documentation

## 2016-12-12 DIAGNOSIS — Z91040 Latex allergy status: Secondary | ICD-10-CM | POA: Diagnosis not present

## 2016-12-12 DIAGNOSIS — Z79899 Other long term (current) drug therapy: Secondary | ICD-10-CM | POA: Insufficient documentation

## 2016-12-12 DIAGNOSIS — R402411 Glasgow coma scale score 13-15, in the field [EMT or ambulance]: Secondary | ICD-10-CM | POA: Diagnosis not present

## 2016-12-12 DIAGNOSIS — R531 Weakness: Secondary | ICD-10-CM | POA: Diagnosis not present

## 2016-12-12 LAB — COMPREHENSIVE METABOLIC PANEL
ALK PHOS: 85 U/L (ref 38–126)
ALT: 16 U/L (ref 14–54)
ANION GAP: 11 (ref 5–15)
AST: 20 U/L (ref 15–41)
Albumin: 3.4 g/dL — ABNORMAL LOW (ref 3.5–5.0)
BILIRUBIN TOTAL: 0.7 mg/dL (ref 0.3–1.2)
BUN: 14 mg/dL (ref 6–20)
CALCIUM: 9.3 mg/dL (ref 8.9–10.3)
CO2: 22 mmol/L (ref 22–32)
Chloride: 109 mmol/L (ref 101–111)
Creatinine, Ser: 0.67 mg/dL (ref 0.44–1.00)
Glucose, Bld: 90 mg/dL (ref 65–99)
Potassium: 3.6 mmol/L (ref 3.5–5.1)
Sodium: 142 mmol/L (ref 135–145)
TOTAL PROTEIN: 6.5 g/dL (ref 6.5–8.1)

## 2016-12-12 LAB — CBC WITH DIFFERENTIAL/PLATELET
Basophils Absolute: 0 10*3/uL (ref 0.0–0.1)
Basophils Relative: 0 %
Eosinophils Absolute: 0.2 10*3/uL (ref 0.0–0.7)
Eosinophils Relative: 2 %
HEMATOCRIT: 39.9 % (ref 36.0–46.0)
HEMOGLOBIN: 12.8 g/dL (ref 12.0–15.0)
Lymphocytes Relative: 28 %
Lymphs Abs: 2.2 10*3/uL (ref 0.7–4.0)
MCH: 25.8 pg — AB (ref 26.0–34.0)
MCHC: 32.1 g/dL (ref 30.0–36.0)
MCV: 80.4 fL (ref 78.0–100.0)
MONO ABS: 0.8 10*3/uL (ref 0.1–1.0)
Monocytes Relative: 11 %
NEUTROS ABS: 4.6 10*3/uL (ref 1.7–7.7)
NEUTROS PCT: 59 %
Platelets: 239 10*3/uL (ref 150–400)
RBC: 4.96 MIL/uL (ref 3.87–5.11)
RDW: 17.7 % — AB (ref 11.5–15.5)
WBC: 7.8 10*3/uL (ref 4.0–10.5)

## 2016-12-12 LAB — I-STAT TROPONIN, ED: TROPONIN I, POC: 0 ng/mL (ref 0.00–0.08)

## 2016-12-12 MED ORDER — SODIUM CHLORIDE 0.9 % IV BOLUS (SEPSIS)
500.0000 mL | Freq: Once | INTRAVENOUS | Status: AC
  Administered 2016-12-12: 500 mL via INTRAVENOUS

## 2016-12-12 NOTE — Progress Notes (Signed)
Location:  [facility] Room Number: [place]:  SNF ([phone])  Taylor Duos, MD  Patient Care Team: Taylor Duos, MD as PCP - General (Internal Medicine) [name], MD (Gastroenterology) [name], NP (Obstetrics and Gynecology)  Extended Emergency Contact Information Primary Emergency Contact: [name],Taylor Address: [place] Phone: [phone] Mobile Phone: [phone] Relation: Daughter Secondary Emergency Contact: [name] Phone: [phone] Mobile Phone: [phone] Relation: Daughter    Allergies: Amoxicillin; Ampicillin; Depakote [valproic acid]; Macrobid [nitrofurantoin macrocrystal]; Penicillins; Azithromycin; Doxycycline; Escitalopram oxalate; Fioricet [butalbital-apap-caffeine]; Latex; Levofloxacin; Nsaids; Oxycodone; Restasis [cyclosporine]; Sulfa antibiotics; and Biaxin [clarithromycin]  Chief Complaint  Patient presents with  . Acute Visit    change in medications    HPI: Patient is 83 y.o. female who is being seen today because of pharmacy says the  Patient has an allergy to ampicillin. This allergy is not reflected in Epic and patient's daughter cannot say she is allergic to ampicillin. Patient has been on IM Rocephin since yesterday for a UTI and is doing fine. Patient is still drinking.  Past Medical History:  Diagnosis Date  . Anxiety   . Arterial tortuosity (aorta) 12/11/2012  . Asthma   . Chronic kidney disease, stage IV (severe) ([place]) 12/11/2012  . Chronic mental illness   . CKD (chronic kidney disease), stage III ([place]) 01/14/2015  . Closed hip fracture requiring operative repair, right, with routine healing, subsequent encounter 12/19/2015  . Colitis   . COPD (chronic obstructive pulmonary disease) ([place])   . Decreased rectal sphincter tone 11/21/2012  . Depression   . Drug-seeking  behavior 02/26/2011   Use of multiple md to obtain benzos/narcotics pt must sign contract to receive controlled meds.   Marland Kitchen Dysphagia 01/11/2015  . GERD (gastroesophageal reflux disease)   . Glaucoma 06/26/2011  . Glaucoma of both eyes   . Hearing loss, sensorineural 03/04/2014   [facility]; 02/26/14. There is not a medical or surgical treatment that would benefit her type of hearing loss. We discussed amplification in an overview fashion.    Marland Kitchen History of fall   . Insomnia, unspecified   . Macular degeneration 06/26/2011  . Migraines   . Nasal bone fracture 03/30/2014  . Osteoporosis   . Osteoporosis 02/26/2011  . Presence of right artificial hip joint   . Protein-calorie malnutrition, severe ([place]) 12/10/2012  . S/P ORIF (open reduction internal fixation) fracture 01/08/2016  . Schatzki's ring 01/14/2015  . Scoliosis   . Substance abuse ([place])    Benbzos and Narcotics, she does not get any of those medicines from [facility], we have empathetically told her that.  . Vertigo   . Vitamin B deficiency   . Vitamin D deficiency 08/25/2016  . Weight loss     Past Surgical History:  Procedure Laterality Date  . ABDOMINAL HYSTERECTOMY    . CATARACT EXTRACTION    . CHOLECYSTECTOMY    . ESOPHAGOGASTRODUODENOSCOPY (EGD) WITH PROPOFOL N/A 01/13/2015   Procedure: ESOPHAGOGASTRODUODENOSCOPY (EGD) WITH PROPOFOL;  Surgeon: [name], MD;  Location: [facility] END[facility];  Service: Endoscopy;  Laterality: N/A;  . HIP ARTHROPLASTY Right 12/21/2015   Procedure: ARTHROPLASTY BIPOLAR HIP (HEMIARTHROPLASTY);  Surgeon: [name], MD;  Location: [facility];  Service: Orthopedics;  Laterality: Right;  . PARTIAL HYSTERECTOMY      Allergies as of 12/11/2016  Reactions   Macrobid [nitrofurantoin Macrocrystal] Rash   Azithromycin Other (See Comments)   unknown   Doxycycline Other (See Comments)   unknown   Escitalopram Oxalate Other (See Comments)   unknown   Fioricet [butalbital-apap-caffeine] Other (See  Comments)   Drunk.   Latex Other (See Comments)   unknown   Levofloxacin Other (See Comments)   unknown   Nsaids Other (See Comments)   This is not an allergy. The patient was told by Dr. Rockne Menghini to avoid NSAIDs and stop Vicoprofen in to 2014 to avoid nephrotoxicity.  Allergic reaction not listed on MAR.   Oxycodone Other (See Comments)   reaction to synthetic codeine   Penicillins    Restasis [cyclosporine] Other (See Comments)   unknown   Sulfa Antibiotics Other (See Comments)   Reaction unknown   Biaxin [clarithromycin] Rash   With burning sensation      Medication List       Accurate as of 12/11/16 11:59 PM. Always use your most recent med list.          acetaminophen 325 MG tablet Commonly known as:  TYLENOL Take 650 mg by mouth every 6 (six) hours as needed for headache.   albuterol 108 (90 Base) MCG/ACT inhaler Commonly known as:  PROVENTIL HFA;VENTOLIN HFA Inhale 1 puff into the lungs every 6 (six) hours as needed for shortness of breath.   ASPERCREME 10 % Lotn Generic drug:  Trolamine Salicylate Apply 1 application topically 2 (two) times daily as needed (for painful areas).   Biotin 5000 MCG Caps Take 1 capsule by mouth daily.   cefTRIAXone 1 g injection Commonly known as:  ROCEPHIN 1 g daily. Inject one gram IM for seven days for e coli UTI. Stop 12/17/16   clonazePAM 0.5 MG tablet Commonly known as:  KLONOPIN Take 0.5 mg by mouth 2 (two) times daily.   Cranberry 425 MG Caps Take 425 mg by mouth 2 (two) times daily.   docusate sodium 100 MG capsule Commonly known as:  COLACE Take 100 mg by mouth 2 (two) times daily.   dorzolamide-timolol 22.3-6.8 MG/ML ophthalmic solution Commonly known as:  COSOPT Place 1 drop into both eyes 2 (two) times daily.   feeding supplement Liqd Take 1 Container by mouth 2 (two) times daily between meals.   NUTRITIONAL SUPPLEMENT Liqd Take by mouth. Med Pass 8 oz twice daily to support weight status   ferrous  sulfate 325 (65 FE) MG tablet Take 325 mg by mouth. Take one tablet daily   fluconazole 100 MG tablet Commonly known as:  DIFLUCAN Take 100 mg by mouth daily. Take one daily for 14 days stop 12/13/16   fludrocortisone 0.1 MG tablet Commonly known as:  FLORINEF Take 0.1 mg by mouth every morning.   HYDROcodone-acetaminophen 5-325 MG tablet Commonly known as:  NORCO/VICODIN Take 1 tablet by mouth 3 (three) times daily. At 9 am , 1 pm , and 9 pm for pain   lactulose 10 GM/15ML solution Commonly known as:  CHRONULAC Take 15 mLs (10 g total) by mouth daily as needed for severe constipation.   lidocaine 5 % Commonly known as:  LIDODERM Place 1 patch onto the skin every 12 (twelve) hours as needed (for pain). Remove & Discard patch within 12 hours or as directed by MD   metoprolol tartrate 25 MG tablet Commonly known as:  LOPRESSOR Take 12.5 mg by mouth 2 (two) times daily.   nitroGLYCERIN 0.4 MG SL tablet Commonly known as:  NITROSTAT Place 0.4 mg under the tongue every 5 (five) minutes as needed for chest pain. May use 3 times   pantoprazole 40 MG tablet Commonly known as:  PROTONIX Take 1 tablet (40 mg total) by mouth daily at 6 (six) AM.   polyethylene glycol packet Commonly known as:  MIRALAX / GLYCOLAX Take 17 g by mouth daily.   QUEtiapine 200 MG 24 hr tablet Commonly known as:  SEROQUEL XR Take 200 mg by mouth. Take one tablet with 50 mg to equal 250 mg once daily   SEROQUEL XR 50 MG Tb24 24 hr tablet Generic drug:  QUEtiapine Take 50 mg by mouth at bedtime. Take one tablet with 200 mg to equal 250 mg daily   ranitidine 150 MG tablet Commonly known as:  ZANTAC Take 150 mg by mouth 2 (two) times daily as needed.   senna 8.6 MG tablet Commonly known as:  SENOKOT Take 2 tablets by mouth 2 (two) times daily as needed for constipation.   travoprost (benzalkonium) 0.004 % ophthalmic solution Commonly known as:  TRAVATAN Place 1 drop into both eyes at bedtime.     Vitamin D 1000 units capsule Take 1,000 Units by mouth daily with breakfast.       No orders of the defined types were placed in this encounter.   Immunization History  Administered Date(s) Administered  . Influenza Split 11/13/2011  . Influenza-Unspecified 02/13/2008, 11/30/2013  . PPD Test 12/15/2015  . Pneumococcal-Unspecified 02/13/2008  . Td 02/12/2009    Social History  Substance Use Topics  . Smoking status: Former Smoker    Packs/day: 0.50    Types: Cigarettes  . Smokeless tobacco: Never Used  . Alcohol use No    Review of Systems  DATA OBTAINED: from nurse-specifically no rash no shortness of breath GENERAL:  no fevers, fatigue, appetite changes SKIN: No itching, rash HEENT: No complaint RESPIRATORY: No cough, wheezing, SOB CARDIAC: No chest pain, palpitations, lower extremity edema  GI: No abdominal pain, No N/V/D or constipation, No heartburn or reflux  GU: No dysuria, frequency or urgency, or incontinence  MUSCULOSKELETAL: No unrelieved bone/joint pain NEUROLOGIC: No headache, dizziness  PSYCHIATRIC: No overt anxiety or sadness  Vitals:   12/11/16 1529  BP: 114/83  Pulse: 96  Resp: 18  Temp: (!) 97.2 F (36.2 C)  SpO2: 98%   Body mass index is 20.88 kg/m. Physical Exam  GENERAL APPEARANCE: Alert,  No acute distress  SKIN: No diaphoresis rash HEENT: Unremarkable RESPIRATORY: Breathing is even, unlabored. Lung sounds are clear   CARDIOVASCULAR: Heart RRR no murmurs, rubs or gallops. No peripheral edema  GASTROINTESTINAL: Abdomen is soft, non-tender, not distended w/ normal bowel sounds.  GENITOURINARY: Bladder non tender, not distended  MUSCULOSKELETAL: No abnormal joints or musculature NEUROLOGIC: Cranial nerves 2-12 grossly intact. Moves all extremities PSYCHIATRIC: Mood and affect with dementia, no behavioral issues  Patient Active Problem List   Diagnosis Date Noted  . Delusions ([place])   . Paranoia ([place])   . Vitamin D deficiency  08/25/2016  . Dementia without behavioral disturbance 03/25/2016  . S/P ORIF (open reduction internal fixation) fracture 01/08/2016  . Oral thrush 01/08/2016  . HTN (hypertension) 01/08/2016  . Closed hip fracture requiring operative repair, right, with routine healing, subsequent encounter 12/19/2015  . Bipolar disease, chronic ([place]) 12/17/2015  . Decubitus ulcer of hip, stage 3 ([place]) 12/11/2015  . Ankle pain   . Encephalopathy   . Malnutrition of moderate degree 01/29/2015  . Altered mental status 01/26/2015  .  Nausea and vomiting 01/17/2015  . Esophageal dysmotilities   . Urinary tract infectious disease   . Weakness 01/14/2015  . Schatzki's ring 01/14/2015  . CKD (chronic kidney disease), stage III ([place]) 01/14/2015  . Esophagus disorder   . Dysphagia 01/11/2015  . UTI (lower urinary tract infection) 03/30/2014  . Hearing loss, sensorineural 03/04/2014  . Nasal lesion 03/04/2014  . Unspecified constipation 12/12/2012  . Hypokalemia 12/11/2012  . Orthostatic hypotension 12/11/2012  . Arterial tortuosity (aorta) 12/11/2012  . Protein-calorie malnutrition, severe ([place]) 12/10/2012  . Decreased rectal sphincter tone 11/21/2012  . Idiopathic scoliosis 07/08/2012  . Macular degeneration 06/26/2011  . Vision disturbance 06/26/2011  . Glaucoma 06/26/2011  . Anemia of chronic disease 04/17/2011  . Drug-seeking behavior 02/26/2011  . Osteoporosis 02/26/2011  . Depression   . Anxiety   . COPD (chronic obstructive pulmonary disease) ([place])   . GERD (gastroesophageal reflux disease)   . Migraines   . Insomnia, unspecified     CMP     Component Value Date/Time   NA 139 09/03/2016 2005   NA 145 06/11/2016   K 3.9 09/03/2016 2005   CL 105 09/03/2016 2005   CO2 25 09/03/2016 2005   GLUCOSE 117 (H) 09/03/2016 2005   BUN 22 (H) 09/03/2016 2005   BUN 20 06/11/2016   CREATININE 0.70 09/03/2016 2005   CREATININE 0.85 11/09/2013 1135   CALCIUM 9.3 09/03/2016 2005   PROT 7.0  09/03/2016 2005   ALBUMIN 3.7 09/03/2016 2005   AST 22 09/03/2016 2005   ALT 12 (L) 09/03/2016 2005   ALKPHOS 106 09/03/2016 2005   BILITOT 0.3 09/03/2016 2005   GFRNONAA >60 09/03/2016 2005   GFRNONAA 64 11/09/2013 1135   GFRAA >60 09/03/2016 2005   GFRAA 74 11/09/2013 1135    Recent Labs  12/15/15 0344  12/28/15 0431  12/29/15 0515 06/11/16 09/03/16 2005  NA 142  < > 139  < > 139 145 139  K 3.9  < > 3.2*  --  3.8 4.6 3.9  CL 111  < > 106  --  107  --  105  CO2 23  < > 23  --  21*  --  25  GLUCOSE 95  < > 110*  --  114*  --  117*  BUN 15  < > 12  < > 10 20 22*  CREATININE 0.74  < > 0.60  < > 0.55 0.6 0.70  CALCIUM 8.9  < > 8.3*  --  8.5*  --  9.3  MG 1.5*  --   --   --   --   --   --   < > = values in this interval not displayed.  Recent Labs  12/19/15 1717 06/11/16 09/03/16 2005  AST 61* 16 22  ALT 46 9 12*  ALKPHOS 52 89 106  BILITOT 0.6  --  0.3  PROT 6.4*  --  7.0  ALBUMIN 2.9*  --  3.7    Recent Labs  12/19/15 1717  12/27/15 0346 05/22/16 09/03/16 2005 12/12/16 1428  WBC 7.8  < > 5.7 9.6 8.0 7.8  NEUTROABS 5.4  --   --   --  4.5 4.6  HGB 10.9*  < > 8.4* 12.0 11.3* 12.8  HCT 35.2*  < > 27.1* 40 35.6* 39.9  MCV 93.9  < > 88.6  --  77.6* 80.4  PLT 285  < > 406* 235 320 239  < > = values in this interval not  displayed.  Recent Labs  06/11/16  CHOL 190  LDLCALC 116  TRIG 101   No results found for: [facility] Lab Results  Component Value Date   TSH 2.61 06/11/2016   Lab Results  Component Value Date   HGBA1C 5.4 06/11/2016   Lab Results  Component Value Date   CHOL 190 06/11/2016   HDL 54 06/11/2016   LDLCALC 116 06/11/2016   TRIG 101 06/11/2016    Significant Diagnostic Results in last 30 days:  Dg Chest 2 View  Result Date: 12/12/2016 CLINICAL DATA:  Weakness EXAM: CHEST  2 VIEW COMPARISON:  09/03/2016 FINDINGS: Cardiac shadow is within normal limits. The lungs are well aerated bilaterally. No focal infiltrate or sizable effusion is  seen. Degenerative changes of the thoracic spine are noted. IMPRESSION: No active cardiopulmonary disease. Electronically Signed   By: Inez Catalina M.D.   On: 12/12/2016 13:41    Assessment and Plan  ALLERGY TO PCN-I combed through Epic looking for an allergy to ampicillin; and abstracted documented in 2006 related the patient was allergic to pen G but did not say anything about what type of response she had to it;we will continue anywhere patient had charts at [facility], [facility] health, and  [place] had listed an allergy to pen G; I have now made patient's allergy to penicillin G part of her Epic records  ENTEROCOCCUS FAECALIS UTI-the patient has a known allergy to ampicillin we can no longer use ampicillin to treat her enterococcus; we will now be using ertapenem 1 g IM daily for 7 days which is the only other choice available    Webb Silversmith D. [name], MD

## 2016-12-12 NOTE — ED Provider Notes (Signed)
[facility] EMERGENCY DEPARTMENT Provider Note   CSN: [id number] Arrival date & time: 12/12/16  1230     History   Chief Complaint Chief Complaint  Patient presents with  . Altered Mental Status    HPI Taylor Roth is a 83 y.o. female.  The history is provided by the patient. No language interpreter was used.  Altered Mental Status   This is a new problem. The current episode started more than 2 days ago. The problem has been gradually worsening. Associated symptoms include confusion and unresponsiveness. Risk factors include a recent infection and a recent illness.  Pt recently diagnosed with a uti.  Pt is reported to have had diarrhea for 24 hours.  Daughter is concerned that pt is dehydrated because she has become dehydrated in the past.  Daughter makes pt has multiple drug allergies.   Past Medical History:  Diagnosis Date  . Anxiety   . Arterial tortuosity (aorta) 12/11/2012  . Asthma   . Chronic kidney disease, stage IV (severe) ([place]) 12/11/2012  . Chronic mental illness   . CKD (chronic kidney disease), stage III ([place]) 01/14/2015  . Closed hip fracture requiring operative repair, right, with routine healing, subsequent encounter 12/19/2015  . Colitis   . COPD (chronic obstructive pulmonary disease) ([place])   . Decreased rectal sphincter tone 11/21/2012  . Depression   . Drug-seeking behavior 02/26/2011   Use of multiple md to obtain benzos/narcotics pt must sign contract to receive controlled meds.   Marland Kitchen Dysphagia 01/11/2015  . GERD (gastroesophageal reflux disease)   . Glaucoma 06/26/2011  . Glaucoma of both eyes   . Hearing loss, sensorineural 03/04/2014   [facility]; 02/26/14. There is not a medical or surgical treatment that would benefit her type of hearing loss. We discussed amplification in an overview fashion.    Marland Kitchen History of fall   . Insomnia, unspecified   . Macular degeneration 06/26/2011  . Migraines   . Nasal bone fracture  03/30/2014  . Osteoporosis   . Osteoporosis 02/26/2011  . Presence of right artificial hip joint   . Protein-calorie malnutrition, severe ([place]) 12/10/2012  . S/P ORIF (open reduction internal fixation) fracture 01/08/2016  . Schatzki's ring 01/14/2015  . Scoliosis   . Substance abuse ([place])    Benbzos and Narcotics, she does not get any of those medicines from [facility], we have empathetically told her that.  . Vertigo   . Vitamin B deficiency   . Vitamin D deficiency 08/25/2016  . Weight loss     Patient Active Problem List   Diagnosis Date Noted  . Delusions ([place])   . Paranoia ([place])   . Vitamin D deficiency 08/25/2016  . Dementia without behavioral disturbance 03/25/2016  . S/P ORIF (open reduction internal fixation) fracture 01/08/2016  . Oral thrush 01/08/2016  . HTN (hypertension) 01/08/2016  . Closed hip fracture requiring operative repair, right, with routine healing, subsequent encounter 12/19/2015  . Bipolar disease, chronic ([place]) 12/17/2015  . Decubitus ulcer of hip, stage 3 ([place]) 12/11/2015  . Ankle pain   . Encephalopathy   . Malnutrition of moderate degree 01/29/2015  . Altered mental status 01/26/2015  . Nausea and vomiting 01/17/2015  . Esophageal dysmotilities   . Urinary tract infectious disease   . Weakness 01/14/2015  . Schatzki's ring 01/14/2015  . CKD (chronic kidney disease), stage III ([place]) 01/14/2015  . Esophagus disorder   . Dysphagia 01/11/2015  . UTI (lower urinary tract infection) 03/30/2014  . Hearing  loss, sensorineural 03/04/2014  . Nasal lesion 03/04/2014  . Unspecified constipation 12/12/2012  . Hypokalemia 12/11/2012  . Orthostatic hypotension 12/11/2012  . Arterial tortuosity (aorta) 12/11/2012  . Protein-calorie malnutrition, severe ([place]) 12/10/2012  . Decreased rectal sphincter tone 11/21/2012  . Idiopathic scoliosis 07/08/2012  . Macular degeneration 06/26/2011  . Vision disturbance 06/26/2011  . Glaucoma 06/26/2011  . Anemia of chronic  disease 04/17/2011  . Drug-seeking behavior 02/26/2011  . Osteoporosis 02/26/2011  . Depression   . Anxiety   . COPD (chronic obstructive pulmonary disease) ([place])   . GERD (gastroesophageal reflux disease)   . Migraines   . Insomnia, unspecified     Past Surgical History:  Procedure Laterality Date  . ABDOMINAL HYSTERECTOMY    . CATARACT EXTRACTION    . CHOLECYSTECTOMY    . ESOPHAGOGASTRODUODENOSCOPY (EGD) WITH PROPOFOL N/A 01/13/2015   Procedure: ESOPHAGOGASTRODUODENOSCOPY (EGD) WITH PROPOFOL;  Surgeon: [name], MD;  Location: [facility] ENDOS[facility];  Service: Endoscopy;  Laterality: N/A;  . HIP ARTHROPLASTY Right 12/21/2015   Procedure: ARTHROPLASTY BIPOLAR HIP (HEMIARTHROPLASTY);  Surgeon: [name], MD;  Location: [facility];  Service: Orthopedics;  Laterality: Right;  . PARTIAL HYSTERECTOMY      OB History    No data available       Home Medications    Prior to Admission medications   Medication Sig Start Date End Date Taking? Authorizing Provider  acetaminophen (TYLENOL) 325 MG tablet Take 650 mg by mouth every 6 (six) hours as needed for headache.    [provider]  albuterol (PROVENTIL HFA;VENTOLIN HFA) 108 (90 BASE) MCG/ACT inhaler Inhale 1 puff into the lungs every 6 (six) hours as needed for shortness of breath.     [provider]  Biotin 5000 MCG CAPS Take 1 capsule by mouth daily.    [provider]  cefTRIAXone (ROCEPHIN) 1 g injection Inject 1 g into the muscle. Inject one gram IM for seven days for e coli UTI. Stop 12/17/16    [provider]  Cholecalciferol (VITAMIN D) 1000 UNITS capsule Take 1,000 Units by mouth daily with breakfast.     [provider]  clonazePAM (KLONOPIN) 0.5 MG tablet Take 0.5 mg by mouth 2 (two) times daily.     [provider]  Cranberry 425 MG CAPS Take 425 mg by mouth 2 (two) times daily.     [provider]  docusate sodium (COLACE) 100 MG capsule Take 100 mg by mouth  2 (two) times daily.    [provider]  dorzolamide-timolol (COSOPT) 22.3-6.8 MG/ML ophthalmic solution Place 1 drop into both eyes 2 (two) times daily.    [provider]  feeding supplement (BOOST / RESOURCE BREEZE) LIQD Take 1 Container by mouth 2 (two) times daily between meals.     [provider]  ferrous sulfate 325 (65 FE) MG tablet Take 325 mg by mouth. Take one tablet daily    [provider]  fluconazole (DIFLUCAN) 100 MG tablet Take 100 mg by mouth. Take one daily for 14 days stop 12/13/16    [provider]  fludrocortisone (FLORINEF) 0.1 MG tablet Take 0.1 mg by mouth every morning.    [provider]  HYDROcodone-acetaminophen (NORCO/VICODIN) 5-325 MG tablet Take 1 tablet by mouth 3 (three) times daily. At 9 am , 1 pm , and 9 pm for pain    [provider]  lactulose (CHRONULAC) 10 GM/15ML solution Take 15 mLs (10 g total) by mouth daily as  needed for severe constipation. 08/12/15   [name], MD  lidocaine (LIDODERM) 5 % Place 1 patch onto the skin every 12 (twelve) hours as needed (for pain). Remove & Discard patch within 12 hours or as directed by MD    [provider]  metoprolol tartrate (LOPRESSOR) 25 MG tablet Take 12.5 mg by mouth 2 (two) times daily.     [provider]  nitroGLYCERIN (NITROSTAT) 0.4 MG SL tablet Place 0.4 mg under the tongue every 5 (five) minutes as needed for chest pain. May use 3 times    [provider]  NUTRITIONAL SUPPLEMENT LIQD Take by mouth. Med Pass 8 oz twice daily to support weight status    [provider]  pantoprazole (PROTONIX) 40 MG tablet Take 1 tablet (40 mg total) by mouth daily at 6 (six) AM. 01/14/15   Rama, Venetia Maxon, MD  polyethylene glycol (MIRALAX / GLYCOLAX) packet Take 17 g by mouth daily.     [provider]  QUEtiapine (SEROQUEL XR) 200 MG 24 hr tablet Take 200 mg by mouth. Take one tablet with 50 mg to equal 250 mg  once daily    [provider]  QUEtiapine (SEROQUEL XR) 50 MG TB24 24 hr tablet Take 50 mg by mouth. Take one tablet with 200 mg to equal 250 mg daily    [provider]  ranitidine (ZANTAC) 150 MG tablet Take 150 mg by mouth. Take one tablet two times daily as needed for heartburn    [provider]  senna (SENOKOT) 8.6 MG tablet Take 2 tablets by mouth 2 (two) times daily as needed for constipation.    [provider]  travoprost, benzalkonium, (TRAVATAN) 0.004 % ophthalmic solution Place 1 drop into both eyes at bedtime.    [provider]  Trolamine Salicylate (ASPERCREME) 10 % LOTN Apply 1 application topically 2 (two) times daily as needed (for painful areas).    [provider]    Family History Family History  Problem Relation Age of Onset  . Stroke Mother   . Diabetes Mother   . Heart disease Mother   . Hearing loss Mother   . Cancer Father 61       esophageal    Social History Social History  Substance Use Topics  . Smoking status: Former Smoker    Packs/day: 0.50    Types: Cigarettes  . Smokeless tobacco: Never Used  . Alcohol use No     Allergies   Macrobid [nitrofurantoin macrocrystal]; Azithromycin; Doxycycline; Escitalopram oxalate; Fioricet [butalbital-apap-caffeine]; Latex; Levofloxacin; Nsaids; Oxycodone; Penicillins; Restasis [cyclosporine]; Sulfa antibiotics; and Biaxin [clarithromycin]   Review of Systems Review of Systems  Psychiatric/Behavioral: Positive for confusion.  All other systems reviewed and are negative.    Physical Exam Updated Vital Signs BP 125/82 (BP Location: Left Arm)   Pulse 73   Temp 98.6 F (37 C) (Oral)   Resp 16   Wt 46.7 kg (103 lb)   LMP  (LMP Unknown)   SpO2 96%   BMI 20.80 kg/m   Physical Exam  Constitutional: She appears well-developed and well-nourished.  HENT:  Head: Normocephalic and atraumatic.  Mouth/Throat: Oropharynx is clear and moist.  Eyes: EOM  are normal.  Neck: Normal range of motion.  Cardiovascular: Normal rate and regular rhythm.   Pulmonary/Chest: Effort normal.  Abdominal: She exhibits no distension.  Musculoskeletal: Normal range of motion.  Neurological: She is alert. Coordination normal.  Skin: Skin is warm.  Psychiatric: She has a  normal mood and affect.  Nursing note and vitals reviewed.    ED Treatments / Results  Labs (all labs ordered are listed, but only abnormal results are displayed) Labs Reviewed  GASTROINTESTINAL PANEL BY PCR, STOOL (REPLACES STOOL CULTURE)  CBC WITH DIFFERENTIAL/PLATELET  COMPREHENSIVE METABOLIC PANEL  URINALYSIS, ROUTINE W REFLEX MICROSCOPIC    EKG  EKG Interpretation None       Radiology Dg Chest 2 View  Result Date: 12/12/2016 CLINICAL DATA:  Weakness EXAM: CHEST  2 VIEW COMPARISON:  09/03/2016 FINDINGS: Cardiac shadow is within normal limits. The lungs are well aerated bilaterally. No focal infiltrate or sizable effusion is seen. Degenerative changes of the thoracic spine are noted. IMPRESSION: No active cardiopulmonary disease. Electronically Signed   By: Inez Catalina M.D.   On: 12/12/2016 13:41    Procedures Procedures (including critical care time)  Medications Ordered in ED Medications  sodium chloride 0.9 % bolus 500 mL (not administered)     Initial Impression / Assessment and Plan / ED Course  I have reviewed the triage vital signs and the nursing notes.  Pertinent labs & imaging results that were available during my care of the patient were reviewed by me and considered in my medical decision making (see chart for details).  Clinical Course as of Dec 12 1504  Wed Dec 12, 2016  1454 Took patient care handoff report from Alyse Low, [place].  She is being treated for recent UTI.  Daughter notes patient is "sleepier than normal."  Started to have diarrhea yesterday.  Placed on a new antibiotic. Plan: Labs pending. May need second fluid bolus.   [SJ]      Clinical Course User Index [SJ] Joy, Shawn C, PA-C      Final Clinical Impressions(s) / ED Diagnoses   Final diagnoses:  None    New Prescriptions New Prescriptions   No medications on file   Pt's care turned over to [facility].  Labs and xray pending.    Fransico Meadow, PA-C 12/12/16 [place], PA-C 12/12/16 1511    Tegeler, Gwenyth Allegra, MD 12/13/16 [phone]

## 2016-12-12 NOTE — ED Triage Notes (Signed)
Pt in from [facility] via [facility] with AMS, LSN yesterday at 1600. EMS states dx with UTI 3 days ago, has been on abx x 2 days, diarrhea since last night. Temp of 99.5 oral PTA. Pt has had poor appetite x few days. MAE's equally, a&ox3

## 2016-12-12 NOTE — Progress Notes (Signed)
Location:  [facility] Room Number: [place]:  SNF ([phone])  [name], MD  Patient Care Team: [name], MD as PCP - General (Internal Medicine) [name], MD (Gastroenterology) [name], NP (Obstetrics and Gynecology)  Extended Emergency Contact Information Primary Emergency Contact: [name],[name] Address: [place], [place] Phone: [phone] Mobile Phone: [phone] Relation: Daughter Secondary Emergency Contact: [name] [address] Phone: [phone] Mobile Phone: [phone] Relation: Daughter    Allergies: Amoxicillin; Ampicillin; Depakote [valproic acid]; Macrobid [nitrofurantoin macrocrystal]; Penicillins; Azithromycin; Doxycycline; Escitalopram oxalate; Fioricet [butalbital-apap-caffeine]; Latex; Levofloxacin; Nsaids; Oxycodone; Restasis [cyclosporine]; Sulfa antibiotics; and Biaxin [clarithromycin]  Chief Complaint  Patient presents with  . Acute Visit    UTI    HPI: Patient is 83 y.o. female who   Past Medical History:  Diagnosis Date  . Anxiety   . Arterial tortuosity (aorta) 12/11/2012  . Asthma   . Chronic kidney disease, stage IV (severe) ([place]) 12/11/2012  . Chronic mental illness   . CKD (chronic kidney disease), stage III ([place]) 01/14/2015  . Closed hip fracture requiring operative repair, right, with routine healing, subsequent encounter 12/19/2015  . Colitis   . COPD (chronic obstructive pulmonary disease) ([place])   . Decreased rectal sphincter tone 11/21/2012  . Depression   . Drug-seeking behavior 02/26/2011   Use of multiple md to obtain benzos/narcotics pt must sign contract to receive controlled meds.   Marland Kitchen Dysphagia 01/11/2015  . GERD (gastroesophageal reflux disease)   . Glaucoma 06/26/2011  . Glaucoma of both eyes   . Hearing loss, sensorineural 03/04/2014   [facility] ENT; 02/26/14. There  is not a medical or surgical treatment that would benefit her type of hearing loss. We discussed amplification in an overview fashion.    Marland Kitchen History of fall   . Insomnia, unspecified   . Macular degeneration 06/26/2011  . Migraines   . Nasal bone fracture 03/30/2014  . Osteoporosis   . Osteoporosis 02/26/2011  . Presence of right artificial hip joint   . Protein-calorie malnutrition, severe ([place]) 12/10/2012  . S/P ORIF (open reduction internal fixation) fracture 01/08/2016  . Schatzki's ring 01/14/2015  . Scoliosis   . Substance abuse ([place])    Benbzos and Narcotics, she does not get any of those medicines from [facility], we have empathetically told her that.  . Vertigo   . Vitamin B deficiency   . Vitamin D deficiency 08/25/2016  . Weight loss     Past Surgical History:  Procedure Laterality Date  . ABDOMINAL HYSTERECTOMY    . CATARACT EXTRACTION    . CHOLECYSTECTOMY    . ESOPHAGOGASTRODUODENOSCOPY (EGD) WITH PROPOFOL N/A 01/13/2015   Procedure: ESOPHAGOGASTRODUODENOSCOPY (EGD) WITH PROPOFOL;  Surgeon: [name], MD;  Location: [facility];  Service: Endoscopy;  Laterality: N/A;  . HIP ARTHROPLASTY Right 12/21/2015   Procedure: ARTHROPLASTY BIPOLAR HIP (HEMIARTHROPLASTY);  Surgeon: [name], MD;  Location: [facility];  Service: Orthopedics;  Laterality: Right;  . PARTIAL HYSTERECTOMY      Allergies as of 12/12/2016      Reactions   Amoxicillin    Per daughter   Ampicillin    Per daughter   Depakote [valproic Acid]    DOES NOT WANT HER TO TAKE IT==Makes her hair fall out= per daughter who is poa   Macrobid [nitrofurantoin Macrocrystal] Rash   Per [facility]  Penicillins    Per daughter   Azithromycin Other (See Comments)   Per [facility]   Doxycycline Other (See Comments)   Per MAR   Escitalopram Oxalate Other (See Comments)   Per MAR   Fioricet [butalbital-apap-caffeine] Other (See Comments)   Drunk. Per MAR   Latex Other (See Comments)   Per MAR   Levofloxacin Other (See  Comments)   Per MAR   Nsaids Other (See Comments)   This is not an allergy. The patient was told by Dr. Rockne Menghini to avoid NSAIDs and stop Vicoprofen in to 2014 to avoid nephrotoxicity. Per MAR.   Oxycodone Other (See Comments)   reaction to synthetic codeine Per MAR   Restasis [cyclosporine] Other (See Comments)   Per MAR   Sulfa Antibiotics Other (See Comments)   Per MAR   Biaxin [clarithromycin] Rash   With burning sensation Per [facility]      Medication List       Accurate as of 12/12/16  3:44 PM. Always use your most recent med list.          acetaminophen 325 MG tablet Commonly known as:  TYLENOL Take 650 mg by mouth every 6 (six) hours as needed for headache.   albuterol 108 (90 Base) MCG/ACT inhaler Commonly known as:  PROVENTIL HFA;VENTOLIN HFA Inhale 1 puff into the lungs every 6 (six) hours as needed for shortness of breath.   ASPERCREME 10 % Lotn Generic drug:  Trolamine Salicylate Apply 1 application topically 2 (two) times daily as needed (for painful areas).   Biotin 5000 MCG Caps Take 1 capsule by mouth daily.   bisacodyl 10 MG suppository Commonly known as:  DULCOLAX Place 10 mg rectally as needed for moderate constipation.   cefTRIAXone 1 g injection Commonly known as:  ROCEPHIN 1 g daily. Inject one gram IM for seven days for e coli UTI. Stop 12/17/16   clonazePAM 0.5 MG tablet Commonly known as:  KLONOPIN Take 0.5 mg by mouth 2 (two) times daily.   Cranberry 425 MG Caps Take 425 mg by mouth 2 (two) times daily.   docusate sodium 100 MG capsule Commonly known as:  COLACE Take 100 mg by mouth 2 (two) times daily.   dorzolamide-timolol 22.3-6.8 MG/ML ophthalmic solution Commonly known as:  COSOPT Place 1 drop into both eyes 2 (two) times daily.   ertapenem IVPB Commonly known as:  INVANZ 1 g daily. IM shot.  reconstitute with 3.31ml lidocaine X 7 days   feeding supplement Liqd Take 1 Container by mouth 2 (two) times daily between meals.     NUTRITIONAL SUPPLEMENT Liqd Take by mouth. Med Pass 8 oz twice daily to support weight status   ferrous sulfate 325 (65 FE) MG tablet Take 325 mg by mouth. Take one tablet daily   fluconazole 100 MG tablet Commonly known as:  DIFLUCAN Take 100 mg by mouth daily. Take one daily for 14 days stop 12/13/16   fludrocortisone 0.1 MG tablet Commonly known as:  FLORINEF Take 0.1 mg by mouth every morning.   HYDROcodone-acetaminophen 5-325 MG tablet Commonly known as:  NORCO/VICODIN Take 1 tablet by mouth 3 (three) times daily. At 9 am , 1 pm , and 9 pm for pain   lactulose 10 GM/15ML solution Commonly known as:  CHRONULAC Take 15 mLs (10 g total) by mouth daily as needed for severe constipation.   lidocaine 5 % Commonly known as:  LIDODERM Place 1 patch onto the skin every 12 (twelve) hours as needed (  for pain). Remove & Discard patch within 12 hours or as directed by MD   magnesium hydroxide 400 MG/5ML suspension Commonly known as:  MILK OF MAGNESIA Take 30 mLs by mouth daily as needed for mild constipation.   metoprolol tartrate 25 MG tablet Commonly known as:  LOPRESSOR Take 12.5 mg by mouth 2 (two) times daily.   nitroGLYCERIN 0.4 MG SL tablet Commonly known as:  NITROSTAT Place 0.4 mg under the tongue every 5 (five) minutes as needed for chest pain. May use 3 times   pantoprazole 40 MG tablet Commonly known as:  PROTONIX Take 1 tablet (40 mg total) by mouth daily at 6 (six) AM.   polyethylene glycol packet Commonly known as:  MIRALAX / GLYCOLAX Take 17 g by mouth daily.   QUEtiapine 200 MG 24 hr tablet Commonly known as:  SEROQUEL XR Take 200 mg by mouth. Take one tablet with 50 mg to equal 250 mg once daily   SEROQUEL XR 50 MG Tb24 24 hr tablet Generic drug:  QUEtiapine Take 50 mg by mouth at bedtime. Take one tablet with 200 mg to equal 250 mg daily   QUEtiapine 200 MG 24 hr tablet Commonly known as:  SEROQUEL XR Take 200 mg by mouth at bedtime. Take with 50mg   ER   RA SALINE ENEMA 19-7 GM/118ML Enem Place 118 mLs rectally daily as needed (for constipation).   ranitidine 150 MG tablet Commonly known as:  ZANTAC Take 150 mg by mouth 2 (two) times daily as needed.   senna 8.6 MG tablet Commonly known as:  SENOKOT Take 2 tablets by mouth 2 (two) times daily as needed for constipation.   travoprost (benzalkonium) 0.004 % ophthalmic solution Commonly known as:  TRAVATAN Place 1 drop into both eyes at bedtime.   Vitamin D 1000 units capsule Take 1,000 Units by mouth daily with breakfast.   WATER ORAL PO Take 120 mLs by mouth every 2 (two) hours while awake.       No orders of the defined types were placed in this encounter.   Immunization History  Administered Date(s) Administered  . Influenza Split 11/13/2011  . Influenza-Unspecified 02/13/2008, 11/30/2013  . PPD Test 12/15/2015  . Pneumococcal-Unspecified 02/13/2008  . Td 02/12/2009    Social History  Substance Use Topics  . Smoking status: Former Smoker    Packs/day: 0.50    Types: Cigarettes  . Smokeless tobacco: Never Used  . Alcohol use No    Review of Systems  DATA OBTAINED: from patient, nurse, medical record, family member GENERAL:  no fevers, fatigue, appetite changes SKIN: No itching, rash HEENT: No complaint RESPIRATORY: No cough, wheezing, SOB CARDIAC: No chest pain, palpitations, lower extremity edema  GI: No abdominal pain, No N/V/D or constipation, No heartburn or reflux  GU: No dysuria, frequency or urgency, or incontinence  MUSCULOSKELETAL: No unrelieved bone/joint pain NEUROLOGIC: No headache, dizziness  PSYCHIATRIC: No overt anxiety or sadness  Vitals:   12/12/16 1542  BP: 117/75  Pulse: 75  Resp: 12  Temp: 99.6 F (37.6 C)  SpO2: 98%   Body mass index is 20.88 kg/m. Physical Exam  GENERAL APPEARANCE: Alert, conversant, No acute distress  SKIN: No diaphoresis rash HEENT: Unremarkable RESPIRATORY: Breathing is even, unlabored. Lung  sounds are clear   CARDIOVASCULAR: Heart RRR no murmurs, rubs or gallops. No peripheral edema  GASTROINTESTINAL: Abdomen is soft, non-tender, not distended w/ normal bowel sounds.  GENITOURINARY: Bladder non tender, not distended  MUSCULOSKELETAL: No abnormal joints or  musculature NEUROLOGIC: Cranial nerves 2-12 grossly intact. Moves all extremities PSYCHIATRIC: Mood and affect appropriate to situation, no behavioral issues  Patient Active Problem List   Diagnosis Date Noted  . Delusions ([place])   . Paranoia ([place])   . Vitamin D deficiency 08/25/2016  . Dementia without behavioral disturbance 03/25/2016  . S/P ORIF (open reduction internal fixation) fracture 01/08/2016  . Oral thrush 01/08/2016  . HTN (hypertension) 01/08/2016  . Closed hip fracture requiring operative repair, right, with routine healing, subsequent encounter 12/19/2015  . Bipolar disease, chronic ([place]) 12/17/2015  . Decubitus ulcer of hip, stage 3 ([place]) 12/11/2015  . Ankle pain   . Encephalopathy   . Malnutrition of moderate degree 01/29/2015  . Altered mental status 01/26/2015  . Nausea and vomiting 01/17/2015  . Esophageal dysmotilities   . Urinary tract infectious disease   . Weakness 01/14/2015  . Schatzki's ring 01/14/2015  . CKD (chronic kidney disease), stage III ([place]) 01/14/2015  . Esophagus disorder   . Dysphagia 01/11/2015  . UTI (lower urinary tract infection) 03/30/2014  . Hearing loss, sensorineural 03/04/2014  . Nasal lesion 03/04/2014  . Unspecified constipation 12/12/2012  . Hypokalemia 12/11/2012  . Orthostatic hypotension 12/11/2012  . Arterial tortuosity (aorta) 12/11/2012  . Protein-calorie malnutrition, severe ([place]) 12/10/2012  . Decreased rectal sphincter tone 11/21/2012  . Idiopathic scoliosis 07/08/2012  . Macular degeneration 06/26/2011  . Vision disturbance 06/26/2011  . Glaucoma 06/26/2011  . Anemia of chronic disease 04/17/2011  . Drug-seeking behavior 02/26/2011  .  Osteoporosis 02/26/2011  . Depression   . Anxiety   . COPD (chronic obstructive pulmonary disease) ([place])   . GERD (gastroesophageal reflux disease)   . Migraines   . Insomnia, unspecified     CMP     Component Value Date/Time   NA 139 09/03/2016 2005   NA 145 06/11/2016   K 3.9 09/03/2016 2005   CL 105 09/03/2016 2005   CO2 25 09/03/2016 2005   GLUCOSE 117 (H) 09/03/2016 2005   BUN 22 (H) 09/03/2016 2005   BUN 20 06/11/2016   CREATININE 0.70 09/03/2016 2005   CREATININE 0.85 11/09/2013 1135   CALCIUM 9.3 09/03/2016 2005   PROT 7.0 09/03/2016 2005   ALBUMIN 3.7 09/03/2016 2005   AST 22 09/03/2016 2005   ALT 12 (L) 09/03/2016 2005   ALKPHOS 106 09/03/2016 2005   BILITOT 0.3 09/03/2016 2005   GFRNONAA >60 09/03/2016 2005   GFRNONAA 64 11/09/2013 1135   GFRAA >60 09/03/2016 2005   GFRAA 74 11/09/2013 1135    Recent Labs  12/15/15 0344  12/28/15 0431  12/29/15 0515 06/11/16 09/03/16 2005  NA 142  < > 139  < > 139 145 139  K 3.9  < > 3.2*  --  3.8 4.6 3.9  CL 111  < > 106  --  107  --  105  CO2 23  < > 23  --  21*  --  25  GLUCOSE 95  < > 110*  --  114*  --  117*  BUN 15  < > 12  < > 10 20 22*  CREATININE 0.74  < > 0.60  < > 0.55 0.6 0.70  CALCIUM 8.9  < > 8.3*  --  8.5*  --  9.3  MG 1.5*  --   --   --   --   --   --   < > = values in this interval not displayed.  Recent Labs  12/19/15 1717 06/11/16  09/03/16 2005  AST 61* 16 22  ALT 46 9 12*  ALKPHOS 52 89 106  BILITOT 0.6  --  0.3  PROT 6.4*  --  7.0  ALBUMIN 2.9*  --  3.7    Recent Labs  12/19/15 1717  12/27/15 0346 05/22/16 09/03/16 2005 12/12/16 1428  WBC 7.8  < > 5.7 9.6 8.0 7.8  NEUTROABS 5.4  --   --   --  4.5 4.6  HGB 10.9*  < > 8.4* 12.0 11.3* 12.8  HCT 35.2*  < > 27.1* 40 35.6* 39.9  MCV 93.9  < > 88.6  --  77.6* 80.4  PLT 285  < > 406* 235 320 239  < > = values in this interval not displayed.  Recent Labs  06/11/16  CHOL 190  LDLCALC 116  TRIG 101   No results found for:  [facility] Lab Results  Component Value Date   TSH 2.61 06/11/2016   Lab Results  Component Value Date   HGBA1C 5.4 06/11/2016   Lab Results  Component Value Date   CHOL 190 06/11/2016   HDL 54 06/11/2016   LDLCALC 116 06/11/2016   TRIG 101 06/11/2016    Significant Diagnostic Results in last 30 days:  Dg Chest 2 View  Result Date: 12/12/2016 CLINICAL DATA:  Weakness EXAM: CHEST  2 VIEW COMPARISON:  09/03/2016 FINDINGS: Cardiac shadow is within normal limits. The lungs are well aerated bilaterally. No focal infiltrate or sizable effusion is seen. Degenerative changes of the thoracic spine are noted. IMPRESSION: No active cardiopulmonary disease. Electronically Signed   By: Inez Catalina M.D.   On: 12/12/2016 13:41    Assessment and Plan  No problem-specific Assessment & Plan notes found for this encounter.   Labs/tests ordered:    [name], MD

## 2016-12-12 NOTE — ED Provider Notes (Signed)
Taylor Roth') is a 83 y.o. female, presenting to the ED with somnolence today.  Daughter states patient has been confused in that she does not know where she is. Visual hallucinations seeing people that are not actually there. More somnolent than normal. This has been going on for at least two weeks. Daughter, Taylor Roth, is at the beside. States patient has improved here in the ED with the IV fluids. Was diagnosed with UTI about a week ago and has been on ABX for this.  UA performed on 10/26, culture showed E. Coli. Treated with rocephin 1g every day and Invanz 1g every day.  Daughter states over the last year patient has become more immobile, more withdrawn, more depressed. Refuses to eat and has stopped her physical therapy.  Patient states she has had some dysuria the last week. Diarrhea beginning yesterday. Daughter states patient has been incontinent of stool and urine for past year.  Denies CP, SOB, cough, abdominal pain, fever/chills, or any other complaints.    HPI from [place], PA-C: "Altered Mental Status   This is a new problem. The current episode started more than 2 days ago. The problem has been gradually worsening. Associated symptoms include confusion and unresponsiveness. Risk factors include a recent infection and a recent illness.  Pt recently diagnosed with a uti.  Pt is reported to have had diarrhea for 24 hours.  Daughter is concerned that pt is dehydrated because she has become dehydrated in the past.  Daughter makes pt has multiple drug allergies."   Past Medical History:  Diagnosis Date  . Anxiety   . Arterial tortuosity (aorta) 12/11/2012  . Asthma   . Chronic kidney disease, stage IV (severe) ([place]) 12/11/2012  . Chronic mental illness   . CKD (chronic kidney disease), stage III ([place]) 01/14/2015  . Closed hip fracture requiring operative repair, right, with routine healing, subsequent encounter 12/19/2015  . Colitis   . COPD (chronic obstructive  pulmonary disease) ([place])   . Decreased rectal sphincter tone 11/21/2012  . Depression   . Drug-seeking behavior 02/26/2011   Use of multiple md to obtain benzos/narcotics pt must sign contract to receive controlled meds.   Marland Kitchen Dysphagia 01/11/2015  . GERD (gastroesophageal reflux disease)   . Glaucoma 06/26/2011  . Glaucoma of both eyes   . Hearing loss, sensorineural 03/04/2014   [facility] ENT; 02/26/14. There is not a medical or surgical treatment that would benefit her type of hearing loss. We discussed amplification in an overview fashion.    Marland Kitchen History of fall   . Insomnia, unspecified   . Macular degeneration 06/26/2011  . Migraines   . Nasal bone fracture 03/30/2014  . Osteoporosis   . Osteoporosis 02/26/2011  . Presence of right artificial hip joint   . Protein-calorie malnutrition, severe ([place]) 12/10/2012  . S/P ORIF (open reduction internal fixation) fracture 01/08/2016  . Schatzki's ring 01/14/2015  . Scoliosis   . Substance abuse ([place])    Benbzos and Narcotics, she does not get any of those medicines from [facility], we have empathetically told her that.  . Vertigo   . Vitamin B deficiency   . Vitamin D deficiency 08/25/2016  . Weight loss    Physical Exam  BP 125/82 (BP Location: Left Arm)   Pulse 73   Temp 98.6 F (37 C) (Oral)   Resp 16   Wt 46.7 kg (103 lb)   LMP  (LMP Unknown)   SpO2 96%   BMI 20.80 kg/m  Physical Exam  Constitutional: She is oriented to person, place, and time. She appears well-developed and well-nourished. No distress.  HENT:  Head: Normocephalic and atraumatic.  Mouth/Throat: Mucous membranes are dry.  Eyes: Pupils are equal, round, and reactive to light. Conjunctivae and EOM are normal.  Decreased vision in right eye that patient states has been assessed for last three months.   Neck: Neck supple.  Cardiovascular: Normal rate, regular rhythm, normal heart sounds and intact distal pulses.   Pulmonary/Chest: Effort normal and breath sounds  normal. No respiratory distress.  Abdominal: Soft. There is no tenderness. There is no guarding.  Musculoskeletal: She exhibits no edema.  Lymphadenopathy:    She has no cervical adenopathy.  Neurological: She is alert and oriented to person, place, and time.  No sensory deficits.  No noted speech deficits. No aphasia. Patient handles oral secretions without difficulty. No noted swallowing defects.  Equal grip strength bilaterally. Strength 5/5 in the upper extremities. Strength 5/5 with flexion and extension of the hips, knees, and ankles bilaterally.  No upright ataxia. Coordination intact including heel to shin and finger to nose.  Cranial nerves III-XII grossly intact.  No facial droop.   Skin: Skin is warm and dry. She is not diaphoretic.  Psychiatric: She has a normal mood and affect. Her behavior is normal.  Nursing note and vitals reviewed.   ED Course  Procedures  MDM  Clinical Course as of Dec 13 1454  Wed Dec 12, 2016  1454 Took patient care handoff report from Alyse Low, [place].  She is being treated for recent UTI.  Daughter notes patient is "sleepier than normal."  Started to have diarrhea yesterday.  Placed on a new antibiotic. Plan: Labs pending. May need second fluid bolus.   [SJ]    Clinical Course User Index [SJ] Joy, Shawn C, PA-C    Patient presents due to a report of somnolence by the patient's daughter.  By the end of the first fluid bolus, patient was acting and maintaining normally, per daughter.  Patient did urinate twice in her brief, but UA collection was delayed because of this. Patient was observed for an adequate amount of time and there was no decline in mental status noted during that time.  Q waves were noted on EKGs today, new since her EKG in November 2017.  Negative troponin today.  Follow-up options and return precautions discussed. Patient and patient's daughter voice understanding of these instructions, accepts the plan, and is comfortable  with discharge.  Findings and plan of care discussed with Pattricia Boss, MD.  Vitals:   12/12/16 1236 12/12/16 1237 12/12/16 1239  BP:   125/82  Pulse:   73  Resp:   16  Temp:   98.6 F (37 C)  TempSrc:   Oral  SpO2: 99%  96%  Weight:  46.7 kg (103 lb)    Vitals:   12/12/16 2100 12/12/16 2200 12/12/16 2300 12/13/16 0106  BP: (!) 171/86 (!) 157/83 126/76 130/81  Pulse: 70 73 74 71  Resp: 11 (!) 7 16 18   Temp:      TempSrc:      SpO2: 96% 97% 94% 95%  Weight:          Layla Maw 12/13/16 0219    Pattricia Boss, MD 12/13/16 2352

## 2016-12-13 DIAGNOSIS — E860 Dehydration: Secondary | ICD-10-CM | POA: Diagnosis not present

## 2016-12-13 DIAGNOSIS — R4182 Altered mental status, unspecified: Secondary | ICD-10-CM | POA: Diagnosis not present

## 2016-12-13 DIAGNOSIS — K591 Functional diarrhea: Secondary | ICD-10-CM | POA: Diagnosis not present

## 2016-12-13 LAB — URINALYSIS, ROUTINE W REFLEX MICROSCOPIC
BACTERIA UA: NONE SEEN
BILIRUBIN URINE: NEGATIVE
GLUCOSE, UA: NEGATIVE mg/dL
HGB URINE DIPSTICK: NEGATIVE
KETONES UR: 5 mg/dL — AB
NITRITE: NEGATIVE
PH: 6 (ref 5.0–8.0)
Protein, ur: NEGATIVE mg/dL
Specific Gravity, Urine: 1.013 (ref 1.005–1.030)

## 2016-12-13 NOTE — Discharge Instructions (Signed)
There were no acute significant abnormalities on labs.  There was some evidence on physical exam of dehydration.  Fluids were replenished.  Please follow-up with your primary care provider as soon as possible.  Return to the ED as needed.  There were some abnormalities noted on the EKG that did not need to be addressed in the emergency room.  However, a follow-up with cardiology may be indicated.  Call the number provided to set up appointment.

## 2016-12-13 NOTE — ED Notes (Signed)
Contacted [facility] for tx back to [facility]

## 2016-12-16 ENCOUNTER — Encounter: Payer: Self-pay | Admitting: Internal Medicine

## 2016-12-24 DIAGNOSIS — R488 Other symbolic dysfunctions: Secondary | ICD-10-CM | POA: Diagnosis not present

## 2016-12-24 DIAGNOSIS — J449 Chronic obstructive pulmonary disease, unspecified: Secondary | ICD-10-CM | POA: Diagnosis not present

## 2016-12-24 DIAGNOSIS — R2681 Unsteadiness on feet: Secondary | ICD-10-CM | POA: Diagnosis not present

## 2016-12-25 NOTE — Progress Notes (Signed)
This encounter was created in error - please disregard.

## 2016-12-30 ENCOUNTER — Encounter: Payer: Self-pay | Admitting: Internal Medicine

## 2016-12-30 NOTE — Assessment & Plan Note (Signed)
GFR in October was greater than 60; creatinine 0.67 stable; patient no longer has chronic kidney disease stage III and is now stage II

## 2016-12-30 NOTE — Assessment & Plan Note (Signed)
Patient's condition still waxes and wanes; continue supportive care

## 2016-12-30 NOTE — Assessment & Plan Note (Signed)
Vitamin D level stable; continue replacement 1000 units by mouth daily

## 2017-01-01 ENCOUNTER — Encounter: Payer: Self-pay | Admitting: Internal Medicine

## 2017-01-01 ENCOUNTER — Non-Acute Institutional Stay (SKILLED_NURSING_FACILITY): Payer: Medicare Other | Admitting: Internal Medicine

## 2017-01-01 DIAGNOSIS — R10819 Abdominal tenderness, unspecified site: Secondary | ICD-10-CM | POA: Diagnosis not present

## 2017-01-01 NOTE — Progress Notes (Signed)
Location:  [place] Room Number: [place]:  SNF ([phone])  [name], MD  Patient Care Team: [name], MD as PCP - General (Internal Medicine) [name], MD (Gastroenterology) [name], NP (Obstetrics and Gynecology)  Extended Emergency Contact Information Primary Emergency Contact: [name],[name] Address: [place], [place] Phone: [phone] Mobile Phone: [phone] Relation: Daughter Secondary Emergency Contact: [name] Phone: [phone] Mobile Phone: [phone] Relation: Daughter    Allergies: Amoxicillin; Ampicillin; Depakote [valproic acid]; Macrobid [nitrofurantoin macrocrystal]; Penicillins; Azithromycin; Doxycycline; Escitalopram oxalate; Fioricet [butalbital-apap-caffeine]; Latex; Levofloxacin; Nsaids; Oxycodone; Restasis [cyclosporine]; Sulfa antibiotics; and Biaxin [clarithromycin]  Chief Complaint  Patient presents with  . Acute Visit    low abdominal pain     HPI: Patient is 83 y.o. female who nursing asked me see because patient  complained of lower abdominal pain. Patient is unable to describe the pain or even say whether the pain is, but when I palpated her abdomen the pain is suprapubic. Per nursing patient has not had a fever and she also had a bowel movement today.  Past Medical History:  Diagnosis Date  . Anxiety   . Arterial tortuosity (aorta) 12/11/2012  . Asthma   . Chronic kidney disease, stage IV (severe) ([place]) 12/11/2012  . Chronic mental illness   . CKD (chronic kidney disease), stage III ([place]) 01/14/2015  . Closed hip fracture requiring operative repair, right, with routine healing, subsequent encounter 12/19/2015  . Colitis   . COPD (chronic obstructive pulmonary disease) ([place])   . Decreased rectal sphincter tone 11/21/2012  . Depression   . Drug-seeking behavior  02/26/2011   Use of multiple md to obtain benzos/narcotics pt must sign contract to receive controlled meds.   Marland Kitchen Dysphagia 01/11/2015  . GERD (gastroesophageal reflux disease)   . Glaucoma 06/26/2011  . Glaucoma of both eyes   . Hearing loss, sensorineural 03/04/2014   [facility]; 02/26/14. There is not a medical or surgical treatment that would benefit her type of hearing loss. We discussed amplification in an overview fashion.    Marland Kitchen History of fall   . Insomnia, unspecified   . Macular degeneration 06/26/2011  . Migraines   . Nasal bone fracture 03/30/2014  . Osteoporosis   . Osteoporosis 02/26/2011  . Presence of right artificial hip joint   . Protein-calorie malnutrition, severe ([place]) 12/10/2012  . S/P ORIF (open reduction internal fixation) fracture 01/08/2016  . Schatzki's ring 01/14/2015  . Scoliosis   . Substance abuse ([place])    Benbzos and Narcotics, she does not get any of those medicines from [facility], we have empathetically told her that.  . Vertigo   . Vitamin B deficiency   . Vitamin D deficiency 08/25/2016  . Weight loss     Past Surgical History:  Procedure Laterality Date  . ABDOMINAL HYSTERECTOMY    . CATARACT EXTRACTION    . CHOLECYSTECTOMY    . ESOPHAGOGASTRODUODENOSCOPY (EGD) WITH PROPOFOL N/A 01/13/2015   Procedure: ESOPHAGOGASTRODUODENOSCOPY (EGD) WITH PROPOFOL;  Surgeon: [name], MD;  Location: [facility];  Service: Endoscopy;  Laterality: N/A;  . HIP ARTHROPLASTY Right 12/21/2015   Procedure: ARTHROPLASTY BIPOLAR HIP (HEMIARTHROPLASTY);  Surgeon: [name], MD;  Location: [facility];  Service: Orthopedics;  Laterality: Right;  . PARTIAL HYSTERECTOMY      Allergies as of 01/01/2017  Reactions   Amoxicillin    Per daughter   Ampicillin    Per daughter   Depakote [valproic Acid]    DOES NOT WANT HER TO TAKE IT==Makes her hair fall out= per daughter who is poa   Macrobid [nitrofurantoin Macrocrystal] Rash   Per MAR   Penicillins    Per  daughter   Azithromycin Other (See Comments)   Per MAR   Doxycycline Other (See Comments)   Per MAR   Escitalopram Oxalate Other (See Comments)   Per MAR   Fioricet [butalbital-apap-caffeine] Other (See Comments)   Drunk. Per MAR   Latex Other (See Comments)   Per MAR   Levofloxacin Other (See Comments)   Per MAR   Nsaids Other (See Comments)   This is not an allergy. The patient was told by Dr. Rockne Menghini to avoid NSAIDs and stop Vicoprofen in to 2014 to avoid nephrotoxicity. Per MAR.   Oxycodone Other (See Comments)   reaction to synthetic codeine Per MAR   Restasis [cyclosporine] Other (See Comments)   Per MAR   Sulfa Antibiotics Other (See Comments)   Per MAR   Biaxin [clarithromycin] Rash   With burning sensation Per [facility]      Medication List        Accurate as of 01/01/17  4:32 PM. Always use your most recent med list.          acetaminophen 325 MG tablet Commonly known as:  TYLENOL Take 650 mg by mouth every 6 (six) hours as needed for headache.   albuterol 108 (90 Base) MCG/ACT inhaler Commonly known as:  PROVENTIL HFA;VENTOLIN HFA Inhale 1 puff into the lungs every 6 (six) hours as needed for shortness of breath.   Biotin 5000 MCG Caps Take 1 capsule by mouth daily.   bisacodyl 10 MG suppository Commonly known as:  DULCOLAX Place 10 mg rectally as needed for moderate constipation.   clonazePAM 0.5 MG tablet Commonly known as:  KLONOPIN Take 0.5 mg by mouth 2 (two) times daily.   Cranberry 425 MG Caps Take 425 mg by mouth 2 (two) times daily.   docusate sodium 100 MG capsule Commonly known as:  COLACE Take 100 mg by mouth 2 (two) times daily.   dorzolamide-timolol 22.3-6.8 MG/ML ophthalmic solution Commonly known as:  COSOPT Place 1 drop into both eyes 2 (two) times daily.   feeding supplement Liqd Take 1 Container by mouth 3 (three) times daily between meals.   ferrous sulfate 325 (65 FE) MG tablet Take 325 mg by mouth. Take one tablet daily     fludrocortisone 0.1 MG tablet Commonly known as:  FLORINEF Take 0.1 mg by mouth every morning.   HYDROcodone-acetaminophen 5-325 MG tablet Commonly known as:  NORCO/VICODIN Take 1 tablet by mouth 3 (three) times daily. At 9 am , 1 pm , and 9 pm for pain   lactulose 10 GM/15ML solution Commonly known as:  CHRONULAC Take 15 mLs (10 g total) by mouth daily as needed for severe constipation.   lidocaine 5 % Commonly known as:  LIDODERM Place 1 patch onto the skin every 12 (twelve) hours as needed (for pain). Remove & Discard patch within 12 hours or as directed by MD   magnesium hydroxide 400 MG/5ML suspension Commonly known as:  MILK OF MAGNESIA Take 30 mLs by mouth daily as needed for mild constipation.   nitroGLYCERIN 0.4 MG SL tablet Commonly known as:  NITROSTAT Place 0.4 mg under the tongue every 5 (five) minutes as  needed for chest pain. May use 3 times   polyethylene glycol packet Commonly known as:  MIRALAX / GLYCOLAX Take 17 g by mouth daily.   QUEtiapine 200 MG 24 hr tablet Commonly known as:  SEROQUEL XR Take 200 mg by mouth. Take one tablet with 50 mg to equal 250 mg once daily   SEROQUEL XR 50 MG Tb24 24 hr tablet Generic drug:  QUEtiapine Take 50 mg by mouth at bedtime. Take one tablet with 200 mg to equal 250 mg daily   RA SALINE ENEMA 19-7 GM/118ML Enem Place 118 mLs rectally daily as needed (for constipation).   senna 8.6 MG tablet Commonly known as:  SENOKOT Take 2 tablets by mouth 2 (two) times daily as needed for constipation.   travoprost (benzalkonium) 0.004 % ophthalmic solution Commonly known as:  TRAVATAN Place 1 drop into both eyes at bedtime.   Vitamin D 1000 units capsule Take 1,000 Units by mouth daily with breakfast.   WATER ORAL PO Take 120 mLs by mouth every 2 (two) hours while awake.       No orders of the defined types were placed in this encounter.   Immunization History  Administered Date(s) Administered  . Influenza  Split 11/13/2011  . Influenza-Unspecified 02/13/2008, 11/30/2013  . PPD Test 12/15/2015  . Pneumococcal-Unspecified 02/13/2008  . Td 02/12/2009    Social History   Tobacco Use  . Smoking status: Former Smoker    Packs/day: 0.50    Types: Cigarettes  . Smokeless tobacco: Never Used  Substance Use Topics  . Alcohol use: No    Review of Systems  DATA OBTAINED: from patient-limited;Nursing  GENERAL:  no fevers, fatigue, appetite changes SKIN: No itching, rash HEENT: No complaint RESPIRATORY: No cough, wheezing, SOB CARDIAC: No chest pain, palpitations, lower extremity edema  GI: + abdominal pain, No N/V/D or constipation, No heartburn or reflux  GU: No dysuria, frequency or urgency, or incontinence  MUSCULOSKELETAL: No unrelieved bone/joint pain NEUROLOGIC: No headache, dizziness  PSYCHIATRIC: No overt anxiety or sadness  Vitals:   01/01/17 1615  BP: (!) 102/56  Pulse: 85  Resp: 18  Temp: 97.8 F (36.6 C)   Body mass index is 19.55 kg/m. Physical Exam  GENERAL APPEARANCE: Alert, minimally conversant, No acute distress  SKIN: No diaphoresis rash HEENT: Unremarkable RESPIRATORY: Breathing is even, unlabored. Lung sounds are clear  ` CARDIOVASCULAR: Heart RRR no murmurs, rubs or gallops. No peripheral edema  GASTROINTESTINAL: Abdomen is soft, non-tender, not distended w/ normal bowel sounds.  GENITOURINARY: Bladder + tender, not distended  MUSCULOSKELETAL: No abnormal joints or musculature NEUROLOGIC: Cranial nerves 2-12 grossly intact. Moves all extremities PSYCHIATRIC: Mood and affect with dementia, no behavioral issues  Patient Active Problem List   Diagnosis Date Noted  . Delusions ([place])   . Paranoia ([place])   . Vitamin D deficiency 08/25/2016  . Dementia without behavioral disturbance 03/25/2016  . S/P ORIF (open reduction internal fixation) fracture 01/08/2016  . Oral thrush 01/08/2016  . HTN (hypertension) 01/08/2016  . Closed hip fracture requiring  operative repair, right, with routine healing, subsequent encounter 12/19/2015  . Bipolar disease, chronic ([place]) 12/17/2015  . Decubitus ulcer of hip, stage 3 ([place]) 12/11/2015  . Ankle pain   . Encephalopathy   . Malnutrition of moderate degree 01/29/2015  . Altered mental status 01/26/2015  . Nausea and vomiting 01/17/2015  . Esophageal dysmotilities   . Urinary tract infectious disease   . Weakness 01/14/2015  . Schatzki's ring 01/14/2015  .  CKD (chronic kidney disease), stage III ([place]) 01/14/2015  . Esophagus disorder   . Dysphagia 01/11/2015  . UTI (lower urinary tract infection) 03/30/2014  . Hearing loss, sensorineural 03/04/2014  . Nasal lesion 03/04/2014  . Unspecified constipation 12/12/2012  . Hypokalemia 12/11/2012  . Orthostatic hypotension 12/11/2012  . Arterial tortuosity (aorta) 12/11/2012  . Protein-calorie malnutrition, severe ([place]) 12/10/2012  . Decreased rectal sphincter tone 11/21/2012  . Idiopathic scoliosis 07/08/2012  . Macular degeneration 06/26/2011  . Vision disturbance 06/26/2011  . Glaucoma 06/26/2011  . Anemia of chronic disease 04/17/2011  . Drug-seeking behavior 02/26/2011  . Osteoporosis 02/26/2011  . Depression   . Anxiety   . COPD (chronic obstructive pulmonary disease) ([place])   . GERD (gastroesophageal reflux disease)   . Migraines   . Insomnia, unspecified     CMP     Component Value Date/Time   NA 142 12/12/2016 1428   NA 145 06/11/2016   K 3.6 12/12/2016 1428   CL 109 12/12/2016 1428   CO2 22 12/12/2016 1428   GLUCOSE 90 12/12/2016 1428   BUN 14 12/12/2016 1428   BUN 20 06/11/2016   CREATININE 0.67 12/12/2016 1428   CREATININE 0.85 11/09/2013 1135   CALCIUM 9.3 12/12/2016 1428   PROT 6.5 12/12/2016 1428   ALBUMIN 3.4 (L) 12/12/2016 1428   AST 20 12/12/2016 1428   ALT 16 12/12/2016 1428   ALKPHOS 85 12/12/2016 1428   BILITOT 0.7 12/12/2016 1428   GFRNONAA >60 12/12/2016 1428   GFRNONAA 64 11/09/2013 1135   GFRAA >60  12/12/2016 1428   GFRAA 74 11/09/2013 1135   Recent Labs    06/11/16 09/03/16 2005 12/12/16 1428  NA 145 139 142  K 4.6 3.9 3.6  CL  --  105 109  CO2  --  25 22  GLUCOSE  --  117* 90  BUN 20 22* 14  CREATININE 0.6 0.70 0.67  CALCIUM  --  9.3 9.3   Recent Labs    06/11/16 09/03/16 2005 12/12/16 1428  AST 16 22 20   ALT 9 12* 16  ALKPHOS 89 106 85  BILITOT  --  0.3 0.7  PROT  --  7.0 6.5  ALBUMIN  --  3.7 3.4*   Recent Labs    05/22/16 09/03/16 2005 12/12/16 1428  WBC 9.6 8.0 7.8  NEUTROABS  --  4.5 4.6  HGB 12.0 11.3* 12.8  HCT 40 35.6* 39.9  MCV  --  77.6* 80.4  PLT 235 320 239   Recent Labs    06/11/16  CHOL 190  LDLCALC 116  TRIG 101   No results found for: [facility] Lab Results  Component Value Date   TSH 2.61 06/11/2016   Lab Results  Component Value Date   HGBA1C 5.4 06/11/2016   Lab Results  Component Value Date   CHOL 190 06/11/2016   HDL 54 06/11/2016   LDLCALC 116 06/11/2016   TRIG 101 06/11/2016    Significant Diagnostic Results in last 30 days:  Dg Chest 2 View  Result Date: 12/12/2016 CLINICAL DATA:  Weakness EXAM: CHEST  2 VIEW COMPARISON:  09/03/2016 FINDINGS: Cardiac shadow is within normal limits. The lungs are well aerated bilaterally. No focal infiltrate or sizable effusion is seen. Degenerative changes of the thoracic spine are noted. IMPRESSION: No active cardiopulmonary disease. Electronically Signed   By: Inez Catalina M.D.   On: 12/12/2016 13:41    Assessment and Plan  SUPRAPUBIC TENDERNESS-no tenderness elsewhere; will order UA with C&S  [name], MD

## 2017-01-04 ENCOUNTER — Encounter: Payer: Self-pay | Admitting: Internal Medicine

## 2017-01-09 DIAGNOSIS — N390 Urinary tract infection, site not specified: Secondary | ICD-10-CM | POA: Diagnosis not present

## 2017-01-09 DIAGNOSIS — R319 Hematuria, unspecified: Secondary | ICD-10-CM | POA: Diagnosis not present

## 2017-01-10 ENCOUNTER — Encounter: Payer: Self-pay | Admitting: Internal Medicine

## 2017-01-10 ENCOUNTER — Non-Acute Institutional Stay (SKILLED_NURSING_FACILITY): Payer: Medicare Other | Admitting: Internal Medicine

## 2017-01-10 DIAGNOSIS — H409 Unspecified glaucoma: Secondary | ICD-10-CM | POA: Diagnosis not present

## 2017-01-10 DIAGNOSIS — F39 Unspecified mood [affective] disorder: Secondary | ICD-10-CM | POA: Diagnosis not present

## 2017-01-10 DIAGNOSIS — R441 Visual hallucinations: Secondary | ICD-10-CM | POA: Diagnosis not present

## 2017-01-10 DIAGNOSIS — R440 Auditory hallucinations: Secondary | ICD-10-CM | POA: Diagnosis not present

## 2017-01-10 DIAGNOSIS — F22 Delusional disorders: Secondary | ICD-10-CM | POA: Diagnosis not present

## 2017-01-10 DIAGNOSIS — F411 Generalized anxiety disorder: Secondary | ICD-10-CM | POA: Diagnosis not present

## 2017-01-10 NOTE — Progress Notes (Signed)
Location:  [place] Room Number: 009F Place of Service:  SNF [phone])  [name], MD  Patient Care Team: [name], MD as PCP - General (Internal Medicine) [name], MD (Gastroenterology) [name], NP (Obstetrics and Gynecology)  Extended Emergency Contact Information Primary Emergency Contact: [name],[name] Address: [place], [place] Phone: [phone] Mobile Phone: [phone] Relation: Daughter Secondary Emergency Contact: [name] Phone: [phone] Mobile Phone: [phone] Relation: Daughter    Allergies: Amoxicillin; Ampicillin; Depakote [valproic acid]; Macrobid [nitrofurantoin macrocrystal]; Penicillins; Azithromycin; Doxycycline; Escitalopram oxalate; Fioricet [butalbital-apap-caffeine]; Latex; Levofloxacin; Nsaids; Oxycodone; Restasis [cyclosporine]; Sulfa antibiotics; and Biaxin [clarithromycin]  Chief Complaint  Patient presents with  . Medical Management of Chronic Issues    routine visit    HPI: Patient is 83 y.o. female who is being seen for routine issues of paranoia, glaucoma, and delusions.  Past Medical History:  Diagnosis Date  . Anxiety   . Arterial tortuosity (aorta) 12/11/2012  . Asthma   . Chronic kidney disease, stage IV (severe) ([place]) 12/11/2012  . Chronic mental illness   . CKD (chronic kidney disease), stage III ([place]) 01/14/2015  . Closed hip fracture requiring operative repair, right, with routine healing, subsequent encounter 12/19/2015  . Colitis   . COPD (chronic obstructive pulmonary disease) ([place])   . Decreased rectal sphincter tone 11/21/2012  . Depression   . Drug-seeking behavior 02/26/2011   Use of multiple md to obtain benzos/narcotics pt must sign contract to receive controlled meds.   Marland Kitchen Dysphagia 01/11/2015  . GERD (gastroesophageal reflux disease)   . Glaucoma  06/26/2011  . Glaucoma of both eyes   . Hearing loss, sensorineural 03/04/2014   [facility]; 02/26/14. There is not a medical or surgical treatment that would benefit her type of hearing loss. We discussed amplification in an overview fashion.    Marland Kitchen History of fall   . Insomnia, unspecified   . Macular degeneration 06/26/2011  . Migraines   . Nasal bone fracture 03/30/2014  . Osteoporosis   . Osteoporosis 02/26/2011  . Presence of right artificial hip joint   . Protein-calorie malnutrition, severe ([place]) 12/10/2012  . S/P ORIF (open reduction internal fixation) fracture 01/08/2016  . Schatzki's ring 01/14/2015  . Scoliosis   . Substance abuse ([place])    Benbzos and Narcotics, she does not get any of those medicines from [facility], we have empathetically told her that.  . Vertigo   . Vitamin B deficiency   . Vitamin D deficiency 08/25/2016  . Weight loss     Past Surgical History:  Procedure Laterality Date  . ABDOMINAL HYSTERECTOMY    . CATARACT EXTRACTION    . CHOLECYSTECTOMY    . ESOPHAGOGASTRODUODENOSCOPY (EGD) WITH PROPOFOL N/A 01/13/2015   Procedure: ESOPHAGOGASTRODUODENOSCOPY (EGD) WITH PROPOFOL;  Surgeon: [name], MD;  [facility];  Service: Endoscopy;  Laterality: N/A;  . HIP ARTHROPLASTY Right 12/21/2015   Procedure: ARTHROPLASTY BIPOLAR HIP (HEMIARTHROPLASTY);  Surgeon: [name], MD;  Location: [place];  Service: Orthopedics;  Laterality: Right;  . PARTIAL HYSTERECTOMY      Allergies as of 01/10/2017      Reactions   Amoxicillin    Per daughter   Ampicillin    Per daughter   Depakote [valproic Acid]    DOES NOT WANT HER TO TAKE IT==Makes her hair fall out= per daughter  who is poa   Macrobid [nitrofurantoin Macrocrystal] Rash   Per MAR   Penicillins    Per daughter   Azithromycin Other (See Comments)   Per MAR   Doxycycline Other (See Comments)   Per MAR   Escitalopram Oxalate Other (See Comments)   Per MAR   Fioricet  [butalbital-apap-caffeine] Other (See Comments)   Drunk. Per MAR   Latex Other (See Comments)   Per MAR   Levofloxacin Other (See Comments)   Per MAR   Nsaids Other (See Comments)   This is not an allergy. The patient was told by Dr. Rockne Menghini to avoid NSAIDs and stop Vicoprofen in to 2014 to avoid nephrotoxicity. Per MAR.   Oxycodone Other (See Comments)   reaction to synthetic codeine Per MAR   Restasis [cyclosporine] Other (See Comments)   Per MAR   Sulfa Antibiotics Other (See Comments)   Per MAR   Biaxin [clarithromycin] Rash   With burning sensation Per [facility]      Medication List        Accurate as of 01/10/17 11:59 PM. Always use your most recent med list.          acetaminophen 325 MG tablet Commonly known as:  TYLENOL Take 650 mg by mouth every 6 (six) hours as needed for headache.   albuterol 108 (90 Base) MCG/ACT inhaler Commonly known as:  PROVENTIL HFA;VENTOLIN HFA Inhale 1 puff into the lungs every 6 (six) hours as needed for shortness of breath.   Biotin 5000 MCG Caps Take 1 capsule by mouth daily.   bisacodyl 10 MG suppository Commonly known as:  DULCOLAX Place 10 mg rectally as needed for moderate constipation.   clonazePAM 0.5 MG tablet Commonly known as:  KLONOPIN Take 0.5 mg by mouth 2 (two) times daily.   Cranberry 425 MG Caps Take 425 mg by mouth 2 (two) times daily.   docusate sodium 100 MG capsule Commonly known as:  COLACE Take 100 mg by mouth 2 (two) times daily.   dorzolamide-timolol 22.3-6.8 MG/ML ophthalmic solution Commonly known as:  COSOPT Place 1 drop into both eyes 2 (two) times daily.   ENSURE Take 237 mLs by mouth. Drink one Ensure three times daily for weight loss   ferrous sulfate 325 (65 FE) MG tablet Take 325 mg by mouth. Take one tablet daily   fludrocortisone 0.1 MG tablet Commonly known as:  FLORINEF Take 0.1 mg by mouth. Take one tablet every morning for orthostatic hypotension   HYDROcodone-acetaminophen 5-325  MG tablet Commonly known as:  NORCO/VICODIN Take 1 tablet by mouth 3 (three) times daily. At 9 am , 1 pm , and 9 pm for pain   lactulose 10 GM/15ML solution Commonly known as:  CHRONULAC Take 15 mLs (10 g total) by mouth daily as needed for severe constipation.   lidocaine 5 % Commonly known as:  LIDODERM Place 1 patch onto the skin every 12 (twelve) hours as needed (for pain). Remove & Discard patch within 12 hours or as directed by MD   magnesium hydroxide 400 MG/5ML suspension Commonly known as:  MILK OF MAGNESIA Take 30 mLs by mouth daily as needed for mild constipation.   nitroGLYCERIN 0.4 MG SL tablet Commonly known as:  NITROSTAT Place 0.4 mg under the tongue every 5 (five) minutes as needed for chest pain. May use 3 times   polyethylene glycol packet Commonly known as:  MIRALAX / GLYCOLAX Take 17 g by mouth daily.   QUEtiapine 200 MG 24 hr  tablet Commonly known as:  SEROQUEL XR Take 200 mg by mouth. Take one tablet with 50 mg to equal 250 mg once daily   SEROQUEL XR 50 MG Tb24 24 hr tablet Generic drug:  QUEtiapine Take 50 mg by mouth at bedtime. Take one tablet with 200 mg to equal 250 mg daily   RA SALINE ENEMA 19-7 GM/118ML Enem Place 118 mLs rectally daily as needed (for constipation).   senna 8.6 MG tablet Commonly known as:  SENOKOT Take 2 tablets by mouth 2 (two) times daily as needed for constipation.   travoprost (benzalkonium) 0.004 % ophthalmic solution Commonly known as:  TRAVATAN Place 1 drop into both eyes at bedtime.   Vitamin D 1000 units capsule Take 1,000 Units by mouth daily with breakfast.       No orders of the defined types were placed in this encounter.   Immunization History  Administered Date(s) Administered  . Influenza Split 11/13/2011  . Influenza-Unspecified 02/13/2008, 11/30/2013, 12/20/2016  . PPD Test 12/15/2015  . Pneumococcal-Unspecified 02/13/2008  . Td 02/12/2009    Social History   Tobacco Use  . Smoking  status: Former Smoker    Packs/day: 0.50    Types: Cigarettes  . Smokeless tobacco: Never Used  Substance Use Topics  . Alcohol use: No    Review of Systems  DATA OBTAINED: from patient-limited; nursing-no acute concerns GENERAL:  no fevers, fatigue, appetite changes SKIN: No itching, rash HEENT: No complaint RESPIRATORY: No cough, wheezing, SOB CARDIAC: No chest pain, palpitations, lower extremity edema  GI: No abdominal pain, No N/V/D or constipation, No heartburn or reflux  GU: No dysuria, frequency or urgency, or incontinence  MUSCULOSKELETAL: No unrelieved bone/joint pain NEUROLOGIC: No headache, dizziness  PSYCHIATRIC: No overt anxiety or sadness  Vitals:   01/10/17 1412  BP: (!) 102/56  Pulse: 85  Resp: 18  Temp: 97.8 F (36.6 C)  SpO2: 98%   Body mass index is 19.71 kg/m. Physical Exam  GENERAL APPEARANCE: Alert, moderately conversant, No acute distress  SKIN: No diaphoresis rash HEENT: Unremarkable RESPIRATORY: Breathing is even, unlabored. Lung sounds are clear   CARDIOVASCULAR: Heart RRR no murmurs, rubs or gallops. No peripheral edema  GASTROINTESTINAL: Abdomen is soft, non-tender, not distended w/ normal bowel sounds.  GENITOURINARY: Bladder non tender, not distended  MUSCULOSKELETAL: No abnormal joints or musculature except for an NEUROLOGIC: Cranial nerves 2-12 grossly intact. Moves all extremities PSYCHIATRIC: Mood and affect strange and with dementia, no behavioral issues  Patient Active Problem List   Diagnosis Date Noted  . Abnormal EKG 01/24/2017  . Delusions ([place])   . Paranoia ([place])   . Vitamin D deficiency 08/25/2016  . Dementia without behavioral disturbance 03/25/2016  . S/P ORIF (open reduction internal fixation) fracture 01/08/2016  . Oral thrush 01/08/2016  . HTN (hypertension) 01/08/2016  . Closed hip fracture requiring operative repair, right, with routine healing, subsequent encounter 12/19/2015  . Bipolar disease, chronic ([place])  12/17/2015  . Decubitus ulcer of hip, stage 3 ([place]) 12/11/2015  . Legally blind 08/16/2015  . Ankle pain   . Encephalopathy   . Malnutrition of moderate degree 01/29/2015  . Altered mental status 01/26/2015  . Nausea and vomiting 01/17/2015  . Esophageal dysmotilities   . Urinary tract infectious disease   . Weakness 01/14/2015  . Schatzki's ring 01/14/2015  . CKD (chronic kidney disease), stage III ([place]) 01/14/2015  . Esophagus disorder   . Dysphagia 01/11/2015  . UTI (lower urinary tract infection) 03/30/2014  . Hearing  loss, sensorineural 03/04/2014  . Nasal lesion 03/04/2014  . Unspecified constipation 12/12/2012  . Hypokalemia 12/11/2012  . Orthostatic hypotension 12/11/2012  . Arterial tortuosity (aorta) 12/11/2012  . Protein-calorie malnutrition, severe ([place]) 12/10/2012  . Decreased rectal sphincter tone 11/21/2012  . Primary open angle glaucoma of both eyes, severe stage 11/11/2012  . Idiopathic scoliosis 07/08/2012  . Macular atrophy, retinal 06/26/2011  . Vision disturbance 06/26/2011  . Glaucoma 06/26/2011  . Nonexudative senile macular degeneration of retina 06/21/2011  . Macula scar of posterior pole 06/21/2011  . Status post intraocular lens implant 06/21/2011  . Anemia of chronic disease 04/17/2011  . Drug-seeking behavior 02/26/2011  . Osteoporosis 02/26/2011  . Depression   . Anxiety   . COPD (chronic obstructive pulmonary disease) ([place])   . GERD (gastroesophageal reflux disease)   . Migraines   . Insomnia, unspecified     CMP     Component Value Date/Time   NA 142 12/12/2016 1428   NA 145 06/11/2016   K 3.6 12/12/2016 1428   CL 109 12/12/2016 1428   CO2 22 12/12/2016 1428   GLUCOSE 90 12/12/2016 1428   BUN 14 12/12/2016 1428   BUN 20 06/11/2016   CREATININE 0.67 12/12/2016 1428   CREATININE 0.85 11/09/2013 1135   CALCIUM 9.3 12/12/2016 1428   PROT 6.5 12/12/2016 1428   ALBUMIN 3.4 (L) 12/12/2016 1428   AST 20 12/12/2016 1428   ALT 16  12/12/2016 1428   ALKPHOS 85 12/12/2016 1428   BILITOT 0.7 12/12/2016 1428   GFRNONAA >60 12/12/2016 1428   GFRNONAA 64 11/09/2013 1135   GFRAA >60 12/12/2016 1428   GFRAA 74 11/09/2013 1135   Recent Labs    06/11/16 09/03/16 2005 12/12/16 1428  NA 145 139 142  K 4.6 3.9 3.6  CL  --  105 109  CO2  --  25 22  GLUCOSE  --  117* 90  BUN 20 22* 14  CREATININE 0.6 0.70 0.67  CALCIUM  --  9.3 9.3   Recent Labs    06/11/16 09/03/16 2005 12/12/16 1428  AST 16 22 20   ALT 9 12* 16  ALKPHOS 89 106 85  BILITOT  --  0.3 0.7  PROT  --  7.0 6.5  ALBUMIN  --  3.7 3.4*   Recent Labs    05/22/16 09/03/16 2005 12/12/16 1428  WBC 9.6 8.0 7.8  NEUTROABS  --  4.5 4.6  HGB 12.0 11.3* 12.8  HCT 40 35.6* 39.9  MCV  --  77.6* 80.4  PLT 235 320 239   Recent Labs    06/11/16  CHOL 190  LDLCALC 116  TRIG 101   No results found for: [facility] Lab Results  Component Value Date   TSH 2.61 06/11/2016   Lab Results  Component Value Date   HGBA1C 5.4 06/11/2016   Lab Results  Component Value Date   CHOL 190 06/11/2016   HDL 54 06/11/2016   LDLCALC 116 06/11/2016   TRIG 101 06/11/2016    Significant Diagnostic Results in last 30 days:  No results found.  Assessment and Plan  Paranoia ([place]) Stable; continue Seroquel 250 mg by mouth daily  Glaucoma Chronic and stable; continue Cosopt 1 drop each eye twice a day and Travatan 1 drop each eye daily at bedtime  Delusions ([name]) Controlled; continue Seroquel 250 mg by mouth daily    Shataria Crist D. [name], MD

## 2017-01-22 ENCOUNTER — Ambulatory Visit: Payer: Medicare Other | Admitting: Interventional Cardiology

## 2017-01-24 ENCOUNTER — Ambulatory Visit (INDEPENDENT_AMBULATORY_CARE_PROVIDER_SITE_OTHER): Payer: Medicare Other | Admitting: Cardiology

## 2017-01-24 ENCOUNTER — Encounter: Payer: Self-pay | Admitting: Cardiology

## 2017-01-24 ENCOUNTER — Other Ambulatory Visit: Payer: Self-pay

## 2017-01-24 ENCOUNTER — Ambulatory Visit (HOSPITAL_COMMUNITY): Payer: Medicare Other | Attending: Cardiovascular Disease

## 2017-01-24 VITALS — BP 90/68 | HR 75 | Ht 59.0 in | Wt 97.0 lb

## 2017-01-24 DIAGNOSIS — I129 Hypertensive chronic kidney disease with stage 1 through stage 4 chronic kidney disease, or unspecified chronic kidney disease: Secondary | ICD-10-CM | POA: Insufficient documentation

## 2017-01-24 DIAGNOSIS — Z87891 Personal history of nicotine dependence: Secondary | ICD-10-CM | POA: Diagnosis not present

## 2017-01-24 DIAGNOSIS — R9431 Abnormal electrocardiogram [ECG] [EKG]: Secondary | ICD-10-CM

## 2017-01-24 DIAGNOSIS — I10 Essential (primary) hypertension: Secondary | ICD-10-CM

## 2017-01-24 DIAGNOSIS — Z8249 Family history of ischemic heart disease and other diseases of the circulatory system: Secondary | ICD-10-CM | POA: Diagnosis not present

## 2017-01-24 DIAGNOSIS — F0280 Dementia in other diseases classified elsewhere without behavioral disturbance: Secondary | ICD-10-CM | POA: Insufficient documentation

## 2017-01-24 DIAGNOSIS — G301 Alzheimer's disease with late onset: Secondary | ICD-10-CM | POA: Diagnosis not present

## 2017-01-24 DIAGNOSIS — N189 Chronic kidney disease, unspecified: Secondary | ICD-10-CM | POA: Insufficient documentation

## 2017-01-24 DIAGNOSIS — J449 Chronic obstructive pulmonary disease, unspecified: Secondary | ICD-10-CM | POA: Diagnosis not present

## 2017-01-24 LAB — ECHOCARDIOGRAM COMPLETE
Height: 59 in
Weight: 1552 oz

## 2017-01-24 NOTE — Progress Notes (Signed)
Cardiology Consultation:    Date:  01/24/2017   ID:  Taylor Roth, DOB [birth year], MRN [id number]  PCP:  [place] Duos, MD  Cardiologist:  [place] Campus, MD   Referring MD: [place] Duos, MD   Chief Complaint  Patient presents with  . Follow-up    Refferred by ED physician  Abnormal EKG  History of Present Illness:    Taylor Roth is a 83 y.o. female who is being seen today for the evaluation of abnormal EKG at the request of [place] Duos, MD.  She is a nursing home resident.  She never had any heart trouble.  Apparently does have history of hypertension but denies having any chest pain tightness squeezing pressure burning chest.  She comes today from nursing home with 2 of her daughters.  She became confused couple weeks ago and ended up going to the emergency room.  Apparently she was found to have urinary tract infection and dehydration and that was treated appropriately.  However, electrocautery gram was done which shows a quite dramatic changes.  Look at the EKG and she got Q waves in all precordial leads.  Interestingly there was no Q waves in limb leads.  It simply did not make sense.  The way she was told she had a heart attack somewhere in the past and she was asked to see [place].  She denies having any history of chest pain tightness squeezing.  Past Medical History:  Diagnosis Date  . Anxiety   . Arterial tortuosity (aorta) 12/11/2012  . Asthma   . Chronic kidney disease, stage IV (severe) ([place]) 12/11/2012  . Chronic mental illness   . CKD (chronic kidney disease), stage III ([place]) 01/14/2015  . Closed hip fracture requiring operative repair, right, with routine healing, subsequent encounter 12/19/2015  . Colitis   . COPD (chronic obstructive pulmonary disease) ([place])   . Decreased rectal sphincter tone 11/21/2012  . Depression   . Drug-seeking behavior 02/26/2011   Use of multiple md to obtain benzos/narcotics pt must sign contract to receive  controlled meds.   Taylor Roth Dysphagia 01/11/2015  . GERD (gastroesophageal reflux disease)   . Glaucoma 06/26/2011  . Glaucoma of both eyes   . Hearing loss, sensorineural 03/04/2014   [facility]NT; 02/26/14. There is not a medical or surgical treatment that would benefit her type of hearing loss. We discussed amplification in an overview fashion.    Taylor Roth History of fall   . Insomnia, unspecified   . Macular degeneration 06/26/2011  . Migraines   . Nasal bone fracture 03/30/2014  . Osteoporosis   . Osteoporosis 02/26/2011  . Presence of right artificial hip joint   . Protein-calorie malnutrition, severe ([place]) 12/10/2012  . S/P ORIF (open reduction internal fixation) fracture 01/08/2016  . Schatzki's ring 01/14/2015  . Scoliosis   . Substance abuse ([place])    Benbzos and Narcotics, she does not get any of those medicines from [facility], we have empathetically told her that.  . Vertigo   . Vitamin B deficiency   . Vitamin D deficiency 08/25/2016  . Weight loss     Past Surgical History:  Procedure Laterality Date  . ABDOMINAL HYSTERECTOMY    . CATARACT EXTRACTION    . CHOLECYSTECTOMY    . ESOPHAGOGASTRODUODENOSCOPY (EGD) WITH PROPOFOL N/A 01/13/2015   Procedure: ESOPHAGOGASTRODUODENOSCOPY (EGD) WITH PROPOFOL;  Surgeon: [place] Horner, MD;  [facility];  Service: Endoscopy;  Laterality: N/A;  . HIP ARTHROPLASTY Right 12/21/2015  Procedure: ARTHROPLASTY BIPOLAR HIP (HEMIARTHROPLASTY);  Surgeon: [place] Stairs, MD;  Location: [place];  Service: Orthopedics;  Laterality: Right;  . PARTIAL HYSTERECTOMY      Current Medications: Current Meds  Medication Sig  . acetaminophen (TYLENOL) 325 MG tablet Take 650 mg by mouth every 6 (six) hours as needed for headache.  . albuterol (PROVENTIL HFA;VENTOLIN HFA) 108 (90 BASE) MCG/ACT inhaler Inhale 1 puff into the lungs every 6 (six) hours as needed for shortness of breath.   . Biotin 5000 MCG CAPS Take 1 capsule by mouth daily.  . bisacodyl  (DULCOLAX) 10 MG suppository Place 10 mg rectally as needed for moderate constipation.  . Cholecalciferol (VITAMIN D) 1000 UNITS capsule Take 1,000 Units by mouth daily with breakfast.   . clonazePAM (KLONOPIN) 0.5 MG tablet Take 0.5 mg by mouth 2 (two) times daily.   . Cranberry 425 MG CAPS Take 425 mg by mouth 2 (two) times daily.   Taylor Roth docusate sodium (COLACE) 100 MG capsule Take 100 mg by mouth 2 (two) times daily.  . dorzolamide-timolol (COSOPT) 22.3-6.8 MG/ML ophthalmic solution Place 1 drop into both eyes 2 (two) times daily.  Taylor Roth ENSURE (ENSURE) Take 237 mLs by mouth. Drink one Ensure three times daily for weight loss  . ferrous sulfate 325 (65 FE) MG tablet Take 325 mg by mouth. Take one tablet daily  . fludrocortisone (FLORINEF) 0.1 MG tablet Take 0.1 mg by mouth every morning.  Taylor Roth HYDROcodone-acetaminophen (NORCO/VICODIN) 5-325 MG tablet Take 1 tablet by mouth 3 (three) times daily. At 9 am , 1 pm , and 9 pm for pain  . lactulose (CHRONULAC) 10 GM/15ML solution Take 15 mLs (10 g total) by mouth daily as needed for severe constipation.  . lidocaine (LIDODERM) 5 % Place 1 patch onto the skin every 12 (twelve) hours as needed (for pain). Remove & Discard patch within 12 hours or as directed by MD  . magnesium hydroxide (MILK OF MAGNESIA) 400 MG/5ML suspension Take 30 mLs by mouth daily as needed for mild constipation.  . mirtazapine (REMERON) 45 MG tablet Take 45 mg by mouth.  . nitroGLYCERIN (NITROSTAT) 0.4 MG SL tablet Place 0.4 mg under the tongue every 5 (five) minutes as needed for chest pain. May use 3 times  . polyethylene glycol (MIRALAX / GLYCOLAX) packet Take 17 g by mouth daily.   Taylor Roth Propylene Glycol-Glycerin 0.6-0.6 % SOLN Place 1 drop into both eyes 4 times daily.  . QUEtiapine (SEROQUEL XR) 200 MG 24 hr tablet Take 200 mg by mouth. Take one tablet with 50 mg to equal 250 mg once daily  . QUEtiapine (SEROQUEL XR) 50 MG TB24 24 hr tablet Take 50 mg by mouth at bedtime. Take one tablet  with 200 mg to equal 250 mg daily   . senna (SENOKOT) 8.6 MG tablet Take 2 tablets by mouth 2 (two) times daily as needed for constipation.  . Sodium Phosphates (RA SALINE ENEMA) 19-7 GM/118ML ENEM Place 118 mLs rectally daily as needed (for constipation).  . travoprost, benzalkonium, (TRAVATAN) 0.004 % ophthalmic solution Place 1 drop into both eyes at bedtime.     Allergies:   Amoxicillin; Ampicillin; Depakote [valproic acid]; Macrobid [nitrofurantoin macrocrystal]; Penicillins; Azithromycin; Doxycycline; Escitalopram oxalate; Fioricet [butalbital-apap-caffeine]; Latex; Levofloxacin; Nsaids; Oxycodone; Restasis [cyclosporine]; Sulfa antibiotics; and Biaxin [clarithromycin]   Social History   Socioeconomic History  . Marital status: Divorced    Spouse name: None  . Number of children: None  . Years of education: None  .  Highest education level: None  Social Needs  . Financial resource strain: None  . Food insecurity - worry: None  . Food insecurity - inability: None  . Transportation needs - medical: None  . Transportation needs - non-medical: None  Occupational History  . Occupation: retired Scientist, clinical (histocompatibility and immunogenetics)  . Smoking status: Former Smoker    Packs/day: 0.50    Types: Cigarettes  . Smokeless tobacco: Never Used  Substance and Sexual Activity  . Alcohol use: No  . Drug use: No  . Sexual activity: No  Other Topics Concern  . None  Social History Narrative   Admitted to [facility] 12/15/2015   Divorced   Former Smoker   Alcohol none   DNR     Family History: The patient's family history includes Cancer (age of onset: 52) in her father; Diabetes in her mother; Hearing loss in her mother; Heart disease in her mother; Stroke in her mother. ROS:   Please see the history of present illness.    All 14 point review of systems negative except as described per history of present illness.  EKGs/Labs/Other Studies Reviewed:    The following studies were reviewed  today: EKG from emergency room showing normal sinus rhythm, Q waves in V1 to V6.  EKG:  EKG is  ordered today.  The ekg ordered today demonstrates normal sinus rhythm rate 72, left axis deviation, low voltage, somewhat poor R wave progression anterior precordium.  No acute ST segment changes  Recent Labs: 06/11/2016: TSH 2.61 12/12/2016: ALT 16; BUN 14; Creatinine, Ser 0.67; Hemoglobin 12.8; Platelets 239; Potassium 3.6; Sodium 142  Recent Lipid Panel    Component Value Date/Time   CHOL 190 06/11/2016   TRIG 101 06/11/2016   HDL 54 06/11/2016   LDLCALC 116 06/11/2016    Physical Exam:    VS:  BP 90/68 (BP Location: Right Arm, Cuff Size: Small)   Pulse 75   Ht 4\' 11"  (1.499 m)   Wt 97 lb (44 kg)   LMP  (LMP Unknown)   SpO2 96%   BMI 19.59 kg/m     Wt Readings from Last 3 Encounters:  01/24/17 97 lb (44 kg)  01/10/17 97 lb 9.6 oz (44.3 kg)  01/01/17 96 lb 12.8 oz (43.9 kg)     GEN:  Well nourished, well developed in no acute distress HEENT: Normal NECK: No JVD; No carotid bruits LYMPHATICS: No lymphadenopathy CARDIAC: RRR, no murmurs, no rubs, no gallops RESPIRATORY:  Clear to auscultation without rales, wheezing or rhonchi  ABDOMEN: Soft, non-tender, non-distended MUSCULOSKELETAL:  No edema; No deformity  SKIN: Warm and dry NEUROLOGIC:  Alert and oriented x 3 PSYCHIATRIC:  Normal affect   ASSESSMENT:    1. Essential hypertension   2. Late onset Alzheimer's disease without behavioral disturbance   3. Abnormal EKG    PLAN:    In order of problems listed above:  1. Abnormal EKG done in the emergency room rising suspicion for large microinfarction.  Patient is asymptomatic and EKG looked different similar to one that she had done in 2017.  I will not change any of her medical therapy echocardiogram will be done to verify diagnosis. 2. Essential hypertension: Blood pressure actually is low today and she is not on medications for her high blood pressure I would simply  monitor that. 3. Wynetta Emery status is unknown we will try to investigate that.   Medication Adjustments/Labs and Tests Ordered: Current medicines are reviewed at length with the  patient today.  Concerns regarding medicines are outlined above.  No orders of the defined types were placed in this encounter.  No orders of the defined types were placed in this encounter.   Signed, Park Liter, MD, [facility]. 01/24/2017 11:52 AM    [place]

## 2017-01-24 NOTE — Addendum Note (Signed)
Addended by: Aleatha Borer on: 01/24/2017 11:58 AM   Modules accepted: Orders

## 2017-01-24 NOTE — Patient Instructions (Addendum)
Medication Instructions:  Your physician recommends that you continue on your current medications as directed. Please refer to the Current Medication list given to you today.  Labwork: None ordered  Testing/Procedures: Your physician has requested that you have an echocardiogram. Echocardiography is a painless test that uses sound waves to create images of your heart. It provides your doctor with information about the size and shape of your heart and how well your heart's chambers and valves are working. This procedure takes approximately one hour. There are no restrictions for this procedure.  Follow-Up: Your physician recommends that you schedule a follow-up appointment in: 1 month with Dr. Agustin Cree   Any Other Special Instructions Will Be Listed Below (If Applicable).     If you need a refill on your cardiac medications before your next appointment, please call your pharmacy.

## 2017-01-30 ENCOUNTER — Non-Acute Institutional Stay (SKILLED_NURSING_FACILITY): Payer: Medicare Other | Admitting: Internal Medicine

## 2017-01-30 DIAGNOSIS — B001 Herpesviral vesicular dermatitis: Secondary | ICD-10-CM

## 2017-02-08 ENCOUNTER — Encounter: Payer: Self-pay | Admitting: Internal Medicine

## 2017-02-08 ENCOUNTER — Non-Acute Institutional Stay (SKILLED_NURSING_FACILITY): Payer: Medicare Other | Admitting: Internal Medicine

## 2017-02-08 DIAGNOSIS — K219 Gastro-esophageal reflux disease without esophagitis: Secondary | ICD-10-CM | POA: Diagnosis not present

## 2017-02-08 DIAGNOSIS — J449 Chronic obstructive pulmonary disease, unspecified: Secondary | ICD-10-CM

## 2017-02-08 DIAGNOSIS — I951 Orthostatic hypotension: Secondary | ICD-10-CM

## 2017-02-08 NOTE — Progress Notes (Signed)
Location:  [facility] Room Number: 081K Place of Service:  SNF [id number])  [name], MD  Patient Care Team: [name], MD as PCP - General (Internal Medicine) [name], MD (Gastroenterology) [name], NP (Obstetrics and Gynecology)  Extended Emergency Contact Information Primary Emergency Contact: [name],[name] Address: [place], [place] Phone: [phone] Mobile Phone: [phone] Relation: Daughter Secondary Emergency Contact: [name] Phone: [phone] Mobile Phone: [phone] Relation: Daughter    Allergies: Amoxicillin; Ampicillin; Depakote [valproic acid]; Macrobid [nitrofurantoin macrocrystal]; Penicillins; Azithromycin; Doxycycline; Escitalopram oxalate; Fioricet [butalbital-apap-caffeine]; Latex; Levofloxacin; Nsaids; Oxycodone; Restasis [cyclosporine]; Sulfa antibiotics; and Biaxin [clarithromycin]  Chief Complaint  Patient presents with  . Medical Management of Chronic Issues    routine visit    HPI: Patient is 83 y.o. female who is being seen for routine issues of hypotension, COPD, and GERD.  Past Medical History:  Diagnosis Date  . Anxiety   . Arterial tortuosity (aorta) 12/11/2012  . Asthma   . Chronic kidney disease, stage IV (severe) ([place]) 12/11/2012  . Chronic mental illness   . CKD (chronic kidney disease), stage III ([place]) 01/14/2015  . Closed hip fracture requiring operative repair, right, with routine healing, subsequent encounter 12/19/2015  . Colitis   . COPD (chronic obstructive pulmonary disease) ([place])   . Decreased rectal sphincter tone 11/21/2012  . Depression   . Drug-seeking behavior 02/26/2011   Use of multiple md to obtain benzos/narcotics pt must sign contract to receive controlled meds.   Marland Kitchen Dysphagia 01/11/2015  . GERD (gastroesophageal reflux disease)   . Glaucoma 06/26/2011  .  Glaucoma of both eyes   . Hearing loss, sensorineural 03/04/2014   [facility] ENT; 02/26/14. There is not a medical or surgical treatment that would benefit her type of hearing loss. We discussed amplification in an overview fashion.    Marland Kitchen History of fall   . Insomnia, unspecified   . Macular degeneration 06/26/2011  . Migraines   . Nasal bone fracture 03/30/2014  . Osteoporosis   . Osteoporosis 02/26/2011  . Presence of right artificial hip joint   . Protein-calorie malnutrition, severe ([place]) 12/10/2012  . S/P ORIF (open reduction internal fixation) fracture 01/08/2016  . Schatzki's ring 01/14/2015  . Scoliosis   . Substance abuse ([place])    Benbzos and Narcotics, she does not get any of those medicines from [facility], we have empathetically told her that.  . Vertigo   . Vitamin B deficiency   . Vitamin D deficiency 08/25/2016  . Weight loss     Past Surgical History:  Procedure Laterality Date  . ABDOMINAL HYSTERECTOMY    . CATARACT EXTRACTION    . CHOLECYSTECTOMY    . ESOPHAGOGASTRODUODENOSCOPY (EGD) WITH PROPOFOL N/A 01/13/2015   Procedure: ESOPHAGOGASTRODUODENOSCOPY (EGD) WITH PROPOFOL;  Surgeon: [name], MD;  Location: [facility];  Service: Endoscopy;  Laterality: N/A;  . HIP ARTHROPLASTY Right 12/21/2015   Procedure: ARTHROPLASTY BIPOLAR HIP (HEMIARTHROPLASTY);  Surgeon: [name], MD;  Location: [facility];  Service: Orthopedics;  Laterality: Right;  . PARTIAL HYSTERECTOMY      Allergies as of 02/08/2017      Reactions   Amoxicillin    Per daughter   Ampicillin    Per daughter   Depakote [valproic Acid]    DOES NOT WANT HER TO TAKE IT==Makes her hair fall out= per daughter  who is poa   Macrobid [nitrofurantoin Macrocrystal] Rash   Per MAR   Penicillins    Per daughter   Azithromycin Other (See Comments)   Per MAR   Doxycycline Other (See Comments)   Per MAR   Escitalopram Oxalate Other (See Comments)   Per MAR   Fioricet [butalbital-apap-caffeine] Other (See  Comments)   Drunk. Per MAR   Latex Other (See Comments)   Per MAR   Levofloxacin Other (See Comments)   Per MAR   Nsaids Other (See Comments)   This is not an allergy. The patient was told by Dr. Rockne Menghini to avoid NSAIDs and stop Vicoprofen in to 2014 to avoid nephrotoxicity. Per MAR.   Oxycodone Other (See Comments)   reaction to synthetic codeine Per MAR   Restasis [cyclosporine] Other (See Comments)   Per MAR   Sulfa Antibiotics Other (See Comments)   Per MAR   Biaxin [clarithromycin] Rash   With burning sensation Per [facility]      Medication List        Accurate as of 02/08/17 11:59 PM. Always use your most recent med list.          acetaminophen 325 MG tablet Commonly known as:  TYLENOL Take 650 mg by mouth every 6 (six) hours as needed for headache.   albuterol 108 (90 Base) MCG/ACT inhaler Commonly known as:  PROVENTIL HFA;VENTOLIN HFA Inhale 1 puff into the lungs every 6 (six) hours as needed for shortness of breath.   Biotin 5000 MCG Caps Take 1 capsule by mouth daily.   bisacodyl 10 MG suppository Commonly known as:  DULCOLAX Place 10 mg rectally as needed for moderate constipation.   clonazePAM 0.5 MG tablet Commonly known as:  KLONOPIN Take 0.5 mg by mouth 2 (two) times daily.   Cranberry 425 MG Caps Take 425 mg by mouth 2 (two) times daily.   docusate sodium 100 MG capsule Commonly known as:  COLACE Take 100 mg by mouth 2 (two) times daily.   dorzolamide-timolol 22.3-6.8 MG/ML ophthalmic solution Commonly known as:  COSOPT Place 1 drop into both eyes 2 (two) times daily.   ENSURE Take 237 mLs by mouth. Drink one Ensure three times daily for weight loss   ferrous sulfate 325 (65 FE) MG tablet Take 325 mg by mouth. Take one tablet daily   fludrocortisone 0.1 MG tablet Commonly known as:  FLORINEF Take 0.1 mg by mouth. Take one tablet every morning for orthostatic hypotension   HYDROcodone-acetaminophen 5-325 MG tablet Commonly known as:   NORCO/VICODIN Take 1 tablet by mouth 3 (three) times daily. At 9 am , 1 pm , and 9 pm for pain   lactulose 10 GM/15ML solution Commonly known as:  CHRONULAC Take 15 mLs (10 g total) by mouth daily as needed for severe constipation.   lidocaine 5 % Commonly known as:  LIDODERM Place 1 patch onto the skin every 12 (twelve) hours as needed (for pain). Remove & Discard patch within 12 hours or as directed by MD   magnesium hydroxide 400 MG/5ML suspension Commonly known as:  MILK OF MAGNESIA Take 30 mLs by mouth daily as needed for mild constipation.   nitroGLYCERIN 0.4 MG SL tablet Commonly known as:  NITROSTAT Place 0.4 mg under the tongue every 5 (five) minutes as needed for chest pain. May use 3 times   polyethylene glycol packet Commonly known as:  MIRALAX / GLYCOLAX Take 17 g by mouth daily.   Propylene Glycol-Glycerin 0.6-0.6 % Soln  Place 1 drop into both eyes 4 times daily.   QUEtiapine 200 MG 24 hr tablet Commonly known as:  SEROQUEL XR Take 200 mg by mouth. Take one tablet with 50 mg to equal 250 mg once daily   SEROQUEL XR 50 MG Tb24 24 hr tablet Generic drug:  QUEtiapine Take 50 mg by mouth at bedtime. Take one tablet with 200 mg to equal 250 mg daily   RA SALINE ENEMA 19-7 GM/118ML Enem Place 118 mLs rectally daily as needed (for constipation).   senna 8.6 MG tablet Commonly known as:  SENOKOT Take 2 tablets by mouth 2 (two) times daily as needed for constipation.   travoprost (benzalkonium) 0.004 % ophthalmic solution Commonly known as:  TRAVATAN Place 1 drop into both eyes at bedtime.   Vitamin D 1000 units capsule Take 1,000 Units by mouth daily with breakfast.       No orders of the defined types were placed in this encounter.   Immunization History  Administered Date(s) Administered  . Influenza Split 11/13/2011  . Influenza-Unspecified 02/13/2008, 11/30/2013, 12/20/2016  . PPD Test 12/15/2015  . Pneumococcal-Unspecified 02/13/2008  . Td  02/12/2009    Social History   Tobacco Use  . Smoking status: Former Smoker    Packs/day: 0.50    Types: Cigarettes  . Smokeless tobacco: Never Used  Substance Use Topics  . Alcohol use: No    Review of Systems  DATA OBTAINED: from patient-limited; nursing-no concerns GENERAL:  no fevers, fatigue, appetite changes SKIN: No itching, rash HEENT: No complaint RESPIRATORY: No cough, wheezing, SOB CARDIAC: No chest pain, palpitations, lower extremity edema  GI: No abdominal pain, No N/V/D or constipation, No heartburn or reflux  GU: No dysuria, frequency or urgency, or incontinence  MUSCULOSKELETAL: No unrelieved bone/joint pain NEUROLOGIC: No headache, dizziness  PSYCHIATRIC: No overt anxiety or sadness  Vitals:   02/08/17 1132  BP: 90/64  Pulse: 88  Resp: 18  Temp: 98.2 F (36.8 C)  SpO2: 97%   Body mass index is 19.59 kg/m. Physical Exam  GENERAL APPEARANCE: Alert, moderately conversant, No acute distress  SKIN: No diaphoresis rash HEENT: Unremarkable RESPIRATORY: Breathing is even, unlabored. Lung sounds are clear   CARDIOVASCULAR: Heart RRR no murmurs, rubs or gallops. No peripheral edema  GASTROINTESTINAL: Abdomen is soft, non-tender, not distended w/ normal bowel sounds.  GENITOURINARY: Bladder non tender, not distended  MUSCULOSKELETAL: No abnormal joints or musculature; very thin NEUROLOGIC: Cranial nerves 2-12 grossly intact. Moves all extremities PSYCHIATRIC: Mood and affect strange with dementia, no behavioral issues  Patient Active Problem List   Diagnosis Date Noted  . Abnormal EKG 01/24/2017  . Delusions ([place])   . Paranoia ([place])   . Vitamin D deficiency 08/25/2016  . Dementia without behavioral disturbance 03/25/2016  . S/P ORIF (open reduction internal fixation) fracture 01/08/2016  . Oral thrush 01/08/2016  . HTN (hypertension) 01/08/2016  . Closed hip fracture requiring operative repair, right, with routine healing, subsequent encounter  12/19/2015  . Bipolar disease, chronic ([place]) 12/17/2015  . Decubitus ulcer of hip, stage 3 ([place]) 12/11/2015  . Legally blind 08/16/2015  . Ankle pain   . Encephalopathy   . Malnutrition of moderate degree 01/29/2015  . Altered mental status 01/26/2015  . Nausea and vomiting 01/17/2015  . Esophageal dysmotilities   . Urinary tract infectious disease   . Weakness 01/14/2015  . Schatzki's ring 01/14/2015  . CKD (chronic kidney disease), stage III ([place]) 01/14/2015  . Esophagus disorder   . Dysphagia  01/11/2015  . UTI (lower urinary tract infection) 03/30/2014  . Hearing loss, sensorineural 03/04/2014  . Nasal lesion 03/04/2014  . Unspecified constipation 12/12/2012  . Hypokalemia 12/11/2012  . Orthostatic hypotension 12/11/2012  . Arterial tortuosity (aorta) 12/11/2012  . Protein-calorie malnutrition, severe ([place]) 12/10/2012  . Decreased rectal sphincter tone 11/21/2012  . Primary open angle glaucoma of both eyes, severe stage 11/11/2012  . Idiopathic scoliosis 07/08/2012  . Macular atrophy, retinal 06/26/2011  . Vision disturbance 06/26/2011  . Glaucoma 06/26/2011  . Nonexudative senile macular degeneration of retina 06/21/2011  . Macula scar of posterior pole 06/21/2011  . Status post intraocular lens implant 06/21/2011  . Anemia of chronic disease 04/17/2011  . Drug-seeking behavior 02/26/2011  . Osteoporosis 02/26/2011  . Depression   . Anxiety   . COPD (chronic obstructive pulmonary disease) ([place])   . GERD (gastroesophageal reflux disease)   . Migraines   . Insomnia, unspecified     CMP     Component Value Date/Time   NA 142 12/12/2016 1428   NA 145 06/11/2016   K 3.6 12/12/2016 1428   CL 109 12/12/2016 1428   CO2 22 12/12/2016 1428   GLUCOSE 90 12/12/2016 1428   BUN 14 12/12/2016 1428   BUN 20 06/11/2016   CREATININE 0.67 12/12/2016 1428   CREATININE 0.85 11/09/2013 1135   CALCIUM 9.3 12/12/2016 1428   PROT 6.5 12/12/2016 1428   ALBUMIN 3.4 (L) 12/12/2016  1428   AST 20 12/12/2016 1428   ALT 16 12/12/2016 1428   ALKPHOS 85 12/12/2016 1428   BILITOT 0.7 12/12/2016 1428   GFRNONAA >60 12/12/2016 1428   GFRNONAA 64 11/09/2013 1135   GFRAA >60 12/12/2016 1428   GFRAA 74 11/09/2013 1135   Recent Labs    06/11/16 09/03/16 2005 12/12/16 1428  NA 145 139 142  K 4.6 3.9 3.6  CL  --  105 109  CO2  --  25 22  GLUCOSE  --  117* 90  BUN 20 22* 14  CREATININE 0.6 0.70 0.67  CALCIUM  --  9.3 9.3   Recent Labs    06/11/16 09/03/16 2005 12/12/16 1428  AST 16 22 20   ALT 9 12* 16  ALKPHOS 89 106 85  BILITOT  --  0.3 0.7  PROT  --  7.0 6.5  ALBUMIN  --  3.7 3.4*   Recent Labs    05/22/16 09/03/16 2005 12/12/16 1428  WBC 9.6 8.0 7.8  NEUTROABS  --  4.5 4.6  HGB 12.0 11.3* 12.8  HCT 40 35.6* 39.9  MCV  --  77.6* 80.4  PLT 235 320 239   Recent Labs    06/11/16  CHOL 190  LDLCALC 116  TRIG 101   No results found for: [facility] Lab Results  Component Value Date   TSH 2.61 06/11/2016   Lab Results  Component Value Date   HGBA1C 5.4 06/11/2016   Lab Results  Component Value Date   CHOL 190 06/11/2016   HDL 54 06/11/2016   LDLCALC 116 06/11/2016   TRIG 101 06/11/2016    Significant Diagnostic Results in last 30 days:  No results found.  Assessment and Plan  Orthostatic hypotension Requiring Florinef; continue Florinef 0.1 mg by mouth daily at bedtime  COPD (chronic obstructive pulmonary disease) ([place]) Continues stable; continue when necessary albuterol  GERD (gastroesophageal reflux disease) No Reported reflux; continue Protonix 40 mg by mouth daily    [place] Yeagley D. [name], MD

## 2017-02-10 ENCOUNTER — Encounter: Payer: Self-pay | Admitting: Internal Medicine

## 2017-02-10 NOTE — Assessment & Plan Note (Signed)
No Reported reflux; continue Protonix 40 mg by mouth daily

## 2017-02-10 NOTE — Assessment & Plan Note (Signed)
Requiring Florinef; continue Florinef 0.1 mg by mouth daily at bedtime

## 2017-02-10 NOTE — Assessment & Plan Note (Signed)
Continues stable; continue when necessary albuterol

## 2017-02-10 NOTE — Assessment & Plan Note (Signed)
Chronic and stable; continue Cosopt 1 drop each eye twice a day and Travatan 1 drop each eye daily at bedtime

## 2017-02-10 NOTE — Assessment & Plan Note (Signed)
Controlled; continue Seroquel 250 mg by mouth daily

## 2017-02-10 NOTE — Assessment & Plan Note (Signed)
Stable; continue Seroquel 250 mg by mouth daily

## 2017-02-15 ENCOUNTER — Non-Acute Institutional Stay (SKILLED_NURSING_FACILITY): Payer: Medicare Other | Admitting: Internal Medicine

## 2017-02-15 DIAGNOSIS — F29 Unspecified psychosis not due to a substance or known physiological condition: Secondary | ICD-10-CM

## 2017-02-16 ENCOUNTER — Encounter: Payer: Self-pay | Admitting: Internal Medicine

## 2017-02-16 NOTE — Progress Notes (Signed)
Location:   [facility] Service:   skilled nursing facility  Taylor Duos, MD  Patient Care Team: Taylor Duos, MD as PCP - General (Internal Medicine) [name], MD (Gastroenterology) [name], NP (Obstetrics and Gynecology)  Extended Emergency Contact Information Primary Emergency Contact: Taylor Roth,Taylor Roth Address: [place], [place] Phone: [phone] Mobile Phone: [phone] Relation: Daughter Secondary Emergency Contact: Taylor Roth [place] Phone: [phone] Mobile Phone: [phone] Relation: Daughter    Allergies: Amoxicillin; Ampicillin; Depakote [valproic acid]; Macrobid [nitrofurantoin macrocrystal]; Penicillins; Azithromycin; Doxycycline; Escitalopram oxalate; Fioricet [butalbital-apap-caffeine]; Latex; Levofloxacin; Nsaids; Oxycodone; Restasis [cyclosporine]; Sulfa antibiotics; and Biaxin [clarithromycin]  Chief Complaint  Patient presents with  . Acute Visit    HPI: [name] who nursing asked me to see because one night this past weekend patient was up on my screaming and talking to people who were not there. After speaking with our nurses discovered the patient has had any further episodes. Patient cannot give me any history.  Past Medical History:  Diagnosis Date  . Anxiety   . Arterial tortuosity (aorta) 12/11/2012  . Asthma   . Chronic kidney disease, stage IV (severe) ([place]) 12/11/2012  . Chronic mental illness   . CKD (chronic kidney disease), stage III ([place]) 01/14/2015  . Closed hip fracture requiring operative repair, right, with routine healing, subsequent encounter 12/19/2015  . Colitis   . COPD (chronic obstructive pulmonary disease) ([place])   . Decreased rectal sphincter tone 11/21/2012  . Depression   . Drug-seeking behavior 02/26/2011   Use of multiple md to obtain benzos/narcotics pt must sign contract to  receive controlled meds.   Marland Kitchen Dysphagia 01/11/2015  . GERD (gastroesophageal reflux disease)   . Glaucoma 06/26/2011  . Glaucoma of both eyes   . Hearing loss, sensorineural 03/04/2014   [facility]; 02/26/14. There is not a medical or surgical treatment that would benefit her type of hearing loss. We discussed amplification in an overview fashion.    Marland Kitchen History of fall   . Insomnia, unspecified   . Macular degeneration 06/26/2011  . Migraines   . Nasal bone fracture 03/30/2014  . Osteoporosis   . Osteoporosis 02/26/2011  . Presence of right artificial hip joint   . Protein-calorie malnutrition, severe ([place]) 12/10/2012  . S/P ORIF (open reduction internal fixation) fracture 01/08/2016  . Schatzki's ring 01/14/2015  . Scoliosis   . Substance abuse ([place])    Benbzos and Narcotics, she does not get any of those medicines from [facility], we have empathetically told her that.  . Vertigo   . Vitamin B deficiency   . Vitamin D deficiency 08/25/2016  . Weight loss     Past Surgical History:  Procedure Laterality Date  . ABDOMINAL HYSTERECTOMY    . CATARACT EXTRACTION    . CHOLECYSTECTOMY    . ESOPHAGOGASTRODUODENOSCOPY (EGD) WITH PROPOFOL N/A 01/13/2015   Procedure: ESOPHAGOGASTRODUODENOSCOPY (EGD) WITH PROPOFOL;  Surgeon: [name], MD;  Location: [facility];  Service: Endoscopy;  Laterality: N/A;  . HIP ARTHROPLASTY Right 12/21/2015   Procedure: ARTHROPLASTY BIPOLAR HIP (HEMIARTHROPLASTY);  Surgeon: [name], MD;  Location: [facility];  Service: Orthopedics;  Laterality: Right;  . PARTIAL HYSTERECTOMY      Allergies as of 02/15/2017      Reactions   Amoxicillin    Per daughter   Ampicillin    Per  daughter   Depakote [valproic Acid]    DOES NOT WANT HER TO TAKE IT==Makes her hair fall out= per daughter who is poa   Macrobid [nitrofurantoin Macrocrystal] Rash   Per MAR   Penicillins    Per daughter   Azithromycin Other (See Comments)   Per MAR   Doxycycline Other (See  Comments)   Per MAR   Escitalopram Oxalate Other (See Comments)   Per MAR   Fioricet [butalbital-apap-caffeine] Other (See Comments)   Drunk. Per MAR   Latex Other (See Comments)   Per MAR   Levofloxacin Other (See Comments)   Per MAR   Nsaids Other (See Comments)   This is not an allergy. The patient was told by Dr. Rockne Menghini to avoid NSAIDs and stop Vicoprofen in to 2014 to avoid nephrotoxicity. Per MAR.   Oxycodone Other (See Comments)   reaction to synthetic codeine Per MAR   Restasis [cyclosporine] Other (See Comments)   Per MAR   Sulfa Antibiotics Other (See Comments)   Per MAR   Biaxin [clarithromycin] Rash   With burning sensation Per [facility]      Medication List        Accurate as of 02/15/17 11:59 PM. Always use your most recent med list.          acetaminophen 325 MG tablet Commonly known as:  TYLENOL Take 650 mg by mouth every 6 (six) hours as needed for headache.   albuterol 108 (90 Base) MCG/ACT inhaler Commonly known as:  PROVENTIL HFA;VENTOLIN HFA Inhale 1 puff into the lungs every 6 (six) hours as needed for shortness of breath.   Biotin 5000 MCG Caps Take 1 capsule by mouth daily.   bisacodyl 10 MG suppository Commonly known as:  DULCOLAX Place 10 mg rectally as needed for moderate constipation.   clonazePAM 0.5 MG tablet Commonly known as:  KLONOPIN Take 0.5 mg by mouth 2 (two) times daily.   Cranberry 425 MG Caps Take 425 mg by mouth 2 (two) times daily.   docusate sodium 100 MG capsule Commonly known as:  COLACE Take 100 mg by mouth 2 (two) times daily.   dorzolamide-timolol 22.3-6.8 MG/ML ophthalmic solution Commonly known as:  COSOPT Place 1 drop into both eyes 2 (two) times daily.   ENSURE Take 237 mLs by mouth. Drink one Ensure three times daily for weight loss   ferrous sulfate 325 (65 FE) MG tablet Take 325 mg by mouth. Take one tablet daily   fludrocortisone 0.1 MG tablet Commonly known as:  FLORINEF Take 0.1 mg by mouth. Take  one tablet every morning for orthostatic hypotension   HYDROcodone-acetaminophen 5-325 MG tablet Commonly known as:  NORCO/VICODIN Take 1 tablet by mouth 3 (three) times daily. At 9 am , 1 pm , and 9 pm for pain   lactulose 10 GM/15ML solution Commonly known as:  CHRONULAC Take 15 mLs (10 g total) by mouth daily as needed for severe constipation.   lidocaine 5 % Commonly known as:  LIDODERM Place 1 patch onto the skin every 12 (twelve) hours as needed (for pain). Remove & Discard patch within 12 hours or as directed by MD   magnesium hydroxide 400 MG/5ML suspension Commonly known as:  MILK OF MAGNESIA Take 30 mLs by mouth daily as needed for mild constipation.   nitroGLYCERIN 0.4 MG SL tablet Commonly known as:  NITROSTAT Place 0.4 mg under the tongue every 5 (five) minutes as needed for chest pain. May use 3 times   polyethylene  glycol packet Commonly known as:  MIRALAX / GLYCOLAX Take 17 g by mouth daily.   Propylene Glycol-Glycerin 0.6-0.6 % Soln Place 1 drop into both eyes 4 times daily.   QUEtiapine 200 MG 24 hr tablet Commonly known as:  SEROQUEL XR Take 200 mg by mouth. Take one tablet with 50 mg to equal 250 mg once daily   SEROQUEL XR 50 MG Tb24 24 hr tablet Generic drug:  QUEtiapine Take 50 mg by mouth at bedtime. Take one tablet with 200 mg to equal 250 mg daily   RA SALINE ENEMA 19-7 GM/118ML Enem Place 118 mLs rectally daily as needed (for constipation).   senna 8.6 MG tablet Commonly known as:  SENOKOT Take 2 tablets by mouth 2 (two) times daily as needed for constipation.   travoprost (benzalkonium) 0.004 % ophthalmic solution Commonly known as:  TRAVATAN Place 1 drop into both eyes at bedtime.   Vitamin D 1000 units capsule Take 1,000 Units by mouth daily with breakfast.       No orders of the defined types were placed in this encounter.   Immunization History  Administered Date(s) Administered  . Influenza Split 11/13/2011  .  Influenza-Unspecified 02/13/2008, 11/30/2013, 12/20/2016  . PPD Test 12/15/2015  . Pneumococcal-Unspecified 02/13/2008  . Td 02/12/2009    Social History   Tobacco Use  . Smoking status: Former Smoker    Packs/day: 0.50    Types: Cigarettes  . Smokeless tobacco: Never Used  Substance Use Topics  . Alcohol use: No    Review of Systems  DATA OBTAINED: from nursing-as per history present illness GENERAL:  no fevers, fatigue, appetite changes SKIN: No itching, rash HEENT: No complaint RESPIRATORY: No cough, wheezing, SOB CARDIAC: No chest pain, palpitations, lower extremity edema  GI: No abdominal pain, No N/V/D or constipation, No heartburn or reflux  GU: No dysuria, frequency or urgency, or incontinence  MUSCULOSKELETAL: No unrelieved bone/joint pain NEUROLOGIC: No headache, dizziness  PSYCHIATRIC: No overt anxiety or sadness  Vitals:   02/16/17 1327  BP: 107/74  Pulse: 85  Resp: 18  Temp: 98.6 F (37 C)   There is no height or weight on file to calculate BMI. Physical Exam  GENERAL APPEARANCE: Alert,non conversant, No acute distress  SKIN: No diaphoresis rash HEENT: Unremarkable RESPIRATORY: Breathing is even, unlabored. Lung sounds are clear   CARDIOVASCULAR: Heart RRR no murmurs, rubs or gallops. No peripheral edema  GASTROINTESTINAL: Abdomen is soft, non-tender, not distended w/ normal bowel sounds.  GENITOURINARY: Bladder non tender, not distended  MUSCULOSKELETAL: No abnormal joints or musculature NEUROLOGIC: Cranial nerves 2-12 grossly intact. Moves all extremities PSYCHIATRIC: Mood and affect with dementia; no behavioral issues  Patient Active Problem List   Diagnosis Date Noted  . Abnormal EKG 01/24/2017  . Delusions ([place])   . Paranoia ([place])   . Vitamin D deficiency 08/25/2016  . Dementia without behavioral disturbance 03/25/2016  . S/P ORIF (open reduction internal fixation) fracture 01/08/2016  . Oral thrush 01/08/2016  . HTN (hypertension)  01/08/2016  . Closed hip fracture requiring operative repair, right, with routine healing, subsequent encounter 12/19/2015  . Bipolar disease, chronic ([place]) 12/17/2015  . Decubitus ulcer of hip, stage 3 ([place]) 12/11/2015  . Legally blind 08/16/2015  . Ankle pain   . Encephalopathy   . Malnutrition of moderate degree 01/29/2015  . Altered mental status 01/26/2015  . Nausea and vomiting 01/17/2015  . Esophageal dysmotilities   . Urinary tract infectious disease   . Weakness 01/14/2015  .  Schatzki's ring 01/14/2015  . CKD (chronic kidney disease), stage III ([place]) 01/14/2015  . Esophagus disorder   . Dysphagia 01/11/2015  . UTI (lower urinary tract infection) 03/30/2014  . Hearing loss, sensorineural 03/04/2014  . Nasal lesion 03/04/2014  . Unspecified constipation 12/12/2012  . Hypokalemia 12/11/2012  . Orthostatic hypotension 12/11/2012  . Arterial tortuosity (aorta) 12/11/2012  . Protein-calorie malnutrition, severe ([place]) 12/10/2012  . Decreased rectal sphincter tone 11/21/2012  . Primary open angle glaucoma of both eyes, severe stage 11/11/2012  . Idiopathic scoliosis 07/08/2012  . Macular atrophy, retinal 06/26/2011  . Vision disturbance 06/26/2011  . Glaucoma 06/26/2011  . Nonexudative senile macular degeneration of retina 06/21/2011  . Macula scar of posterior pole 06/21/2011  . Status post intraocular lens implant 06/21/2011  . Anemia of chronic disease 04/17/2011  . Drug-seeking behavior 02/26/2011  . Osteoporosis 02/26/2011  . Depression   . Anxiety   . COPD (chronic obstructive pulmonary disease) ([place])   . GERD (gastroesophageal reflux disease)   . Migraines   . Insomnia, unspecified     CMP     Component Value Date/Time   NA 142 12/12/2016 1428   NA 145 06/11/2016   K 3.6 12/12/2016 1428   CL 109 12/12/2016 1428   CO2 22 12/12/2016 1428   GLUCOSE 90 12/12/2016 1428   BUN 14 12/12/2016 1428   BUN 20 06/11/2016   CREATININE 0.67 12/12/2016 1428    CREATININE 0.85 11/09/2013 1135   CALCIUM 9.3 12/12/2016 1428   PROT 6.5 12/12/2016 1428   ALBUMIN 3.4 (L) 12/12/2016 1428   AST 20 12/12/2016 1428   ALT 16 12/12/2016 1428   ALKPHOS 85 12/12/2016 1428   BILITOT 0.7 12/12/2016 1428   GFRNONAA >60 12/12/2016 1428   GFRNONAA 64 11/09/2013 1135   GFRAA >60 12/12/2016 1428   GFRAA 74 11/09/2013 1135   Recent Labs    06/11/16 09/03/16 2005 12/12/16 1428  NA 145 139 142  K 4.6 3.9 3.6  CL  --  105 109  CO2  --  25 22  GLUCOSE  --  117* 90  BUN 20 22* 14  CREATININE 0.6 0.70 0.67  CALCIUM  --  9.3 9.3   Recent Labs    06/11/16 09/03/16 2005 12/12/16 1428  AST 16 22 20   ALT 9 12* 16  ALKPHOS 89 106 85  BILITOT  --  0.3 0.7  PROT  --  7.0 6.5  ALBUMIN  --  3.7 3.4*   Recent Labs    05/22/16 09/03/16 2005 12/12/16 1428  WBC 9.6 8.0 7.8  NEUTROABS  --  4.5 4.6  HGB 12.0 11.3* 12.8  HCT 40 35.6* 39.9  MCV  --  77.6* 80.4  PLT 235 320 239   Recent Labs    06/11/16  CHOL 190  LDLCALC 116  TRIG 101   No results found for: [facility] Lab Results  Component Value Date   TSH 2.61 06/11/2016   Lab Results  Component Value Date   HGBA1C 5.4 06/11/2016   Lab Results  Component Value Date   CHOL 190 06/11/2016   HDL 54 06/11/2016   LDLCALC 116 06/11/2016   TRIG 101 06/11/2016    Significant Diagnostic Results in last 30 days:  No results found.  Assessment and Plan  Psychoses-patient is on Seroquel 250 mg daily at bedtime; I don't see any benefit in increasing that at the moment and she has not had any further behaviors; will consult psychiatry  Inocencio Homes, MD

## 2017-02-19 DIAGNOSIS — H3530 Unspecified macular degeneration: Secondary | ICD-10-CM | POA: Diagnosis not present

## 2017-02-19 DIAGNOSIS — K219 Gastro-esophageal reflux disease without esophagitis: Secondary | ICD-10-CM | POA: Diagnosis not present

## 2017-02-19 DIAGNOSIS — R399 Unspecified symptoms and signs involving the genitourinary system: Secondary | ICD-10-CM | POA: Diagnosis not present

## 2017-02-19 DIAGNOSIS — M810 Age-related osteoporosis without current pathological fracture: Secondary | ICD-10-CM | POA: Diagnosis not present

## 2017-02-22 DIAGNOSIS — F411 Generalized anxiety disorder: Secondary | ICD-10-CM | POA: Diagnosis not present

## 2017-02-22 DIAGNOSIS — F39 Unspecified mood [affective] disorder: Secondary | ICD-10-CM | POA: Diagnosis not present

## 2017-02-22 DIAGNOSIS — I10 Essential (primary) hypertension: Secondary | ICD-10-CM | POA: Diagnosis not present

## 2017-02-22 DIAGNOSIS — R441 Visual hallucinations: Secondary | ICD-10-CM | POA: Diagnosis not present

## 2017-02-22 DIAGNOSIS — F0390 Unspecified dementia without behavioral disturbance: Secondary | ICD-10-CM | POA: Diagnosis not present

## 2017-02-22 LAB — CBC AND DIFFERENTIAL
HCT: 40 (ref 36–46)
HEMOGLOBIN: 13.2 (ref 12.0–16.0)
Platelets: 218 (ref 150–399)
WBC: 4.8

## 2017-02-22 LAB — BASIC METABOLIC PANEL
BUN: 17 (ref 4–21)
Creatinine: 0.6 (ref 0.5–1.1)
Glucose: 97
POTASSIUM: 4.4 (ref 3.4–5.3)
Sodium: 143 (ref 137–147)

## 2017-02-22 LAB — HEPATIC FUNCTION PANEL
ALK PHOS: 117 (ref 25–125)
ALT: 9 (ref 7–35)
AST: 16 (ref 13–35)
Bilirubin, Total: 0.2

## 2017-02-27 ENCOUNTER — Ambulatory Visit: Payer: Self-pay | Admitting: Cardiology

## 2017-03-06 ENCOUNTER — Encounter: Payer: Self-pay | Admitting: Internal Medicine

## 2017-03-06 NOTE — Progress Notes (Signed)
Location:  [place] of Service:  SNF (31)  [name], MD  Patient Care Team: [name], MD as PCP - General (Internal Medicine) [name], MD (Gastroenterology) [name], NP (Obstetrics and Gynecology)  Extended Emergency Contact Information Primary Emergency Contact: [name],[name] Address: [place], [place] Phone: [phone] Mobile Phone: [phone] Relation: Daughter Secondary Emergency Contact: [name] Phone: [phone] Mobile Phone: [phone] Relation: Daughter    Allergies: Amoxicillin; Ampicillin; Depakote [valproic acid]; Macrobid [nitrofurantoin macrocrystal]; Penicillins; Azithromycin; Doxycycline; Escitalopram oxalate; Fioricet [butalbital-apap-caffeine]; Latex; Levofloxacin; Nsaids; Oxycodone; Restasis [cyclosporine]; Sulfa antibiotics; and Biaxin [clarithromycin]  Chief Complaint  Patient presents with  . Acute Visit    HPI: [name] who who is being seen today at nursing request because she has developed some ulcers on her lips in the last couple of days and she is complaining of pain. Patient has had no fever chills nausea vomiting, no complaint of mouth pain; eating makes it feel worse, nothing makes it feel better.  Past Medical History:  Diagnosis Date  . Anxiety   . Arterial tortuosity (aorta) 12/11/2012  . Asthma   . Chronic kidney disease, stage IV (severe) ([place]) 12/11/2012  . Chronic mental illness   . CKD (chronic kidney disease), stage III ([place]) 01/14/2015  . Closed hip fracture requiring operative repair, right, with routine healing, subsequent encounter 12/19/2015  . Colitis   . COPD (chronic obstructive pulmonary disease) ([place])   . Decreased rectal sphincter tone 11/21/2012  . Depression   . Drug-seeking behavior 02/26/2011   Use of multiple md to obtain  benzos/narcotics pt must sign contract to receive controlled meds.   Marland Kitchen Dysphagia 01/11/2015  . GERD (gastroesophageal reflux disease)   . Glaucoma 06/26/2011  . Glaucoma of both eyes   . Hearing loss, sensorineural 03/04/2014   [facility]; 02/26/14. There is not a medical or surgical treatment that would benefit her type of hearing loss. We discussed amplification in an overview fashion.    Marland Kitchen History of fall   . Insomnia, unspecified   . Macular degeneration 06/26/2011  . Migraines   . Nasal bone fracture 03/30/2014  . Osteoporosis   . Osteoporosis 02/26/2011  . Presence of right artificial hip joint   . Protein-calorie malnutrition, severe ([place]) 12/10/2012  . S/P ORIF (open reduction internal fixation) fracture 01/08/2016  . Schatzki's ring 01/14/2015  . Scoliosis   . Substance abuse ([place])    Benbzos and Narcotics, she does not get any of those medicines from [facility], we have empathetically told her that.  . Vertigo   . Vitamin B deficiency   . Vitamin D deficiency 08/25/2016  . Weight loss     Past Surgical History:  Procedure Laterality Date  . ABDOMINAL HYSTERECTOMY    . CATARACT EXTRACTION    . CHOLECYSTECTOMY    . ESOPHAGOGASTRODUODENOSCOPY (EGD) WITH PROPOFOL N/A 01/13/2015   Procedure: ESOPHAGOGASTRODUODENOSCOPY (EGD) WITH PROPOFOL;  Surgeon: [name], MD;  Location: [facility];  Service: Endoscopy;  Laterality: N/A;  . HIP ARTHROPLASTY Right 12/21/2015   Procedure: ARTHROPLASTY BIPOLAR HIP (HEMIARTHROPLASTY);  Surgeon: [name], MD;  Location: [place];  Service: Orthopedics;  Laterality: Right;  . PARTIAL HYSTERECTOMY      Allergies as of 01/30/2017      Reactions   Amoxicillin    Per  daughter   Ampicillin    Per daughter   Depakote [valproic Acid]    DOES NOT WANT HER TO TAKE IT==Makes her hair fall out= per daughter who is poa   Macrobid [nitrofurantoin Macrocrystal] Rash   Per MAR   Penicillins    Per daughter   Azithromycin Other (See Comments)    Per MAR   Doxycycline Other (See Comments)   Per MAR   Escitalopram Oxalate Other (See Comments)   Per MAR   Fioricet [butalbital-apap-caffeine] Other (See Comments)   Drunk. Per MAR   Latex Other (See Comments)   Per MAR   Levofloxacin Other (See Comments)   Per MAR   Nsaids Other (See Comments)   This is not an allergy. The patient was told by Dr. Rockne Menghini to avoid NSAIDs and stop Vicoprofen in to 2014 to avoid nephrotoxicity. Per MAR.   Oxycodone Other (See Comments)   reaction to synthetic codeine Per MAR   Restasis [cyclosporine] Other (See Comments)   Per MAR   Sulfa Antibiotics Other (See Comments)   Per MAR   Biaxin [clarithromycin] Rash   With burning sensation Per [facility]      Medication List        Accurate as of 01/30/17 11:59 PM. Always use your most recent med list.          acetaminophen 325 MG tablet Commonly known as:  TYLENOL Take 650 mg by mouth every 6 (six) hours as needed for headache.   albuterol 108 (90 Base) MCG/ACT inhaler Commonly known as:  PROVENTIL HFA;VENTOLIN HFA Inhale 1 puff into the lungs every 6 (six) hours as needed for shortness of breath.   Biotin 5000 MCG Caps Take 1 capsule by mouth daily.   bisacodyl 10 MG suppository Commonly known as:  DULCOLAX Place 10 mg rectally as needed for moderate constipation.   clonazePAM 0.5 MG tablet Commonly known as:  KLONOPIN Take 0.5 mg by mouth 2 (two) times daily.   Cranberry 425 MG Caps Take 425 mg by mouth 2 (two) times daily.   docusate sodium 100 MG capsule Commonly known as:  COLACE Take 100 mg by mouth 2 (two) times daily.   dorzolamide-timolol 22.3-6.8 MG/ML ophthalmic solution Commonly known as:  COSOPT Place 1 drop into both eyes 2 (two) times daily.   ENSURE Take 237 mLs by mouth. Drink one Ensure three times daily for weight loss   ferrous sulfate 325 (65 FE) MG tablet Take 325 mg by mouth. Take one tablet daily   fludrocortisone 0.1 MG tablet Commonly known as:   FLORINEF Take 0.1 mg by mouth. Take one tablet every morning for orthostatic hypotension   HYDROcodone-acetaminophen 5-325 MG tablet Commonly known as:  NORCO/VICODIN Take 1 tablet by mouth 3 (three) times daily. At 9 am , 1 pm , and 9 pm for pain   lactulose 10 GM/15ML solution Commonly known as:  CHRONULAC Take 15 mLs (10 g total) by mouth daily as needed for severe constipation.   lidocaine 5 % Commonly known as:  LIDODERM Place 1 patch onto the skin every 12 (twelve) hours as needed (for pain). Remove & Discard patch within 12 hours or as directed by MD   magnesium hydroxide 400 MG/5ML suspension Commonly known as:  MILK OF MAGNESIA Take 30 mLs by mouth daily as needed for mild constipation.   nitroGLYCERIN 0.4 MG SL tablet Commonly known as:  NITROSTAT Place 0.4 mg under the tongue every 5 (five) minutes as needed for chest  pain. May use 3 times   polyethylene glycol packet Commonly known as:  MIRALAX / GLYCOLAX Take 17 g by mouth daily.   Propylene Glycol-Glycerin 0.6-0.6 % Soln Place 1 drop into both eyes 4 times daily.   QUEtiapine 200 MG 24 hr tablet Commonly known as:  SEROQUEL XR Take 200 mg by mouth. Take one tablet with 50 mg to equal 250 mg once daily   SEROQUEL XR 50 MG Tb24 24 hr tablet Generic drug:  QUEtiapine Take 50 mg by mouth at bedtime. Take one tablet with 200 mg to equal 250 mg daily   RA SALINE ENEMA 19-7 GM/118ML Enem Place 118 mLs rectally daily as needed (for constipation).   senna 8.6 MG tablet Commonly known as:  SENOKOT Take 2 tablets by mouth 2 (two) times daily as needed for constipation.   travoprost (benzalkonium) 0.004 % ophthalmic solution Commonly known as:  TRAVATAN Place 1 drop into both eyes at bedtime.   Vitamin D 1000 units capsule Take 1,000 Units by mouth daily with breakfast.       No orders of the defined types were placed in this encounter.   Immunization History  Administered Date(s) Administered  .  Influenza Split 11/13/2011  . Influenza-Unspecified 02/13/2008, 11/30/2013, 12/20/2016  . PPD Test 12/15/2015  . Pneumococcal-Unspecified 02/13/2008  . Td 02/12/2009    Social History   Tobacco Use  . Smoking status: Former Smoker    Packs/day: 0.50    Types: Cigarettes  . Smokeless tobacco: Never Used  Substance Use Topics  . Alcohol use: No    Review of Systems  DATA OBTAINED: from patient-very limited; nursing-as per history of present illness GENERAL:  no fevers, fatigue, appetite changes SKIN: Blister/ulcers on upper and lower lip HEENT: No complaint RESPIRATORY: No cough, wheezing, SOB CARDIAC: No chest pain, palpitations, lower extremity edema  GI: No abdominal pain, No N/V/D or constipation, No heartburn or reflux  GU: No dysuria, frequency or urgency, or incontinence  MUSCULOSKELETAL: No unrelieved bone/joint pain NEUROLOGIC: No headache, dizziness  PSYCHIATRIC: No overt anxiety or sadness  Vitals:   03/06/17 1338  BP: 112/86  Pulse: 78  Resp: 16  Temp: 98.2 F (36.8 C)   There is no height or weight on file to calculate BMI. Physical Exam  GENERAL APPEARANCE: Alert, conversant, No acute distress  SKIN: Several ulcers on upper and lower lip HEENT: Unremarkable, no lesions inside mouth RESPIRATORY: Breathing is even, unlabored. Lung sounds are clear   CARDIOVASCULAR: Heart RRR no murmurs, rubs or gallops. No peripheral edema  GASTROINTESTINAL: Abdomen is soft, non-tender, not distended w/ normal bowel sounds.  GENITOURINARY: Bladder non tender, not distended  MUSCULOSKELETAL: No abnormal joints or musculature NEUROLOGIC: Cranial nerves 2-12 grossly intact. Moves all extremities PSYCHIATRIC: Mood and affect dementia, no behavioral issues  Patient Active Problem List   Diagnosis Date Noted  . Abnormal EKG 01/24/2017  . Delusions ([place])   . Paranoia ([place])   . Vitamin D deficiency 08/25/2016  . Dementia without behavioral disturbance 03/25/2016  . S/P  ORIF (open reduction internal fixation) fracture 01/08/2016  . Oral thrush 01/08/2016  . HTN (hypertension) 01/08/2016  . Closed hip fracture requiring operative repair, right, with routine healing, subsequent encounter 12/19/2015  . Bipolar disease, chronic ([place]) 12/17/2015  . Decubitus ulcer of hip, stage 3 ([place]) 12/11/2015  . Legally blind 08/16/2015  . Ankle pain   . Encephalopathy   . Malnutrition of moderate degree 01/29/2015  . Altered mental status 01/26/2015  .  Nausea and vomiting 01/17/2015  . Esophageal dysmotilities   . Urinary tract infectious disease   . Weakness 01/14/2015  . Schatzki's ring 01/14/2015  . CKD (chronic kidney disease), stage III ([place]) 01/14/2015  . Esophagus disorder   . Dysphagia 01/11/2015  . UTI (lower urinary tract infection) 03/30/2014  . Hearing loss, sensorineural 03/04/2014  . Nasal lesion 03/04/2014  . Unspecified constipation 12/12/2012  . Hypokalemia 12/11/2012  . Orthostatic hypotension 12/11/2012  . Arterial tortuosity (aorta) 12/11/2012  . Protein-calorie malnutrition, severe ([place]) 12/10/2012  . Decreased rectal sphincter tone 11/21/2012  . Primary open angle glaucoma of both eyes, severe stage 11/11/2012  . Idiopathic scoliosis 07/08/2012  . Macular atrophy, retinal 06/26/2011  . Vision disturbance 06/26/2011  . Glaucoma 06/26/2011  . Nonexudative senile macular degeneration of retina 06/21/2011  . Macula scar of posterior pole 06/21/2011  . Status post intraocular lens implant 06/21/2011  . Anemia of chronic disease 04/17/2011  . Drug-seeking behavior 02/26/2011  . Osteoporosis 02/26/2011  . Depression   . Anxiety   . COPD (chronic obstructive pulmonary disease) ([place])   . GERD (gastroesophageal reflux disease)   . Migraines   . Insomnia, unspecified     CMP     Component Value Date/Time   NA 142 12/12/2016 1428   NA 145 06/11/2016   K 3.6 12/12/2016 1428   CL 109 12/12/2016 1428   CO2 22 12/12/2016 1428   GLUCOSE 90  12/12/2016 1428   BUN 14 12/12/2016 1428   BUN 20 06/11/2016   CREATININE 0.67 12/12/2016 1428   CREATININE 0.85 11/09/2013 1135   CALCIUM 9.3 12/12/2016 1428   PROT 6.5 12/12/2016 1428   ALBUMIN 3.4 (L) 12/12/2016 1428   AST 20 12/12/2016 1428   ALT 16 12/12/2016 1428   ALKPHOS 85 12/12/2016 1428   BILITOT 0.7 12/12/2016 1428   GFRNONAA >60 12/12/2016 1428   GFRNONAA 64 11/09/2013 1135   GFRAA >60 12/12/2016 1428   GFRAA 74 11/09/2013 1135   Recent Labs    06/11/16 09/03/16 2005 12/12/16 1428  NA 145 139 142  K 4.6 3.9 3.6  CL  --  105 109  CO2  --  25 22  GLUCOSE  --  117* 90  BUN 20 22* 14  CREATININE 0.6 0.70 0.67  CALCIUM  --  9.3 9.3   Recent Labs    06/11/16 09/03/16 2005 12/12/16 1428  AST 16 22 20   ALT 9 12* 16  ALKPHOS 89 106 85  BILITOT  --  0.3 0.7  PROT  --  7.0 6.5  ALBUMIN  --  3.7 3.4*   Recent Labs    05/22/16 09/03/16 2005 12/12/16 1428  WBC 9.6 8.0 7.8  NEUTROABS  --  4.5 4.6  HGB 12.0 11.3* 12.8  HCT 40 35.6* 39.9  MCV  --  77.6* 80.4  PLT 235 320 239   Recent Labs    06/11/16  CHOL 190  LDLCALC 116  TRIG 101   No results found for: [facility] Lab Results  Component Value Date   TSH 2.61 06/11/2016   Lab Results  Component Value Date   HGBA1C 5.4 06/11/2016   Lab Results  Component Value Date   CHOL 190 06/11/2016   HDL 54 06/11/2016   LDLCALC 116 06/11/2016   TRIG 101 06/11/2016    Significant Diagnostic Results in last 30 days:  No results found.  Assessment and Plan  Herpes labialis-will give Valtrex 2000 mg by mouth now and 2000  mg in 12 hours to complete the full course of antivirals   Inocencio Homes, MD

## 2017-03-07 DIAGNOSIS — R1314 Dysphagia, pharyngoesophageal phase: Secondary | ICD-10-CM | POA: Diagnosis not present

## 2017-03-07 DIAGNOSIS — F0390 Unspecified dementia without behavioral disturbance: Secondary | ICD-10-CM | POA: Diagnosis not present

## 2017-03-08 DIAGNOSIS — R1314 Dysphagia, pharyngoesophageal phase: Secondary | ICD-10-CM | POA: Diagnosis not present

## 2017-03-08 DIAGNOSIS — F0390 Unspecified dementia without behavioral disturbance: Secondary | ICD-10-CM | POA: Diagnosis not present

## 2017-03-09 DIAGNOSIS — F0390 Unspecified dementia without behavioral disturbance: Secondary | ICD-10-CM | POA: Diagnosis not present

## 2017-03-09 DIAGNOSIS — R1314 Dysphagia, pharyngoesophageal phase: Secondary | ICD-10-CM | POA: Diagnosis not present

## 2017-03-11 ENCOUNTER — Non-Acute Institutional Stay (SKILLED_NURSING_FACILITY): Payer: Medicare Other | Admitting: Internal Medicine

## 2017-03-11 ENCOUNTER — Encounter: Payer: Self-pay | Admitting: Internal Medicine

## 2017-03-11 DIAGNOSIS — F319 Bipolar disorder, unspecified: Secondary | ICD-10-CM | POA: Diagnosis not present

## 2017-03-11 DIAGNOSIS — G301 Alzheimer's disease with late onset: Secondary | ICD-10-CM | POA: Diagnosis not present

## 2017-03-11 DIAGNOSIS — F0390 Unspecified dementia without behavioral disturbance: Secondary | ICD-10-CM | POA: Diagnosis not present

## 2017-03-11 DIAGNOSIS — R1314 Dysphagia, pharyngoesophageal phase: Secondary | ICD-10-CM | POA: Diagnosis not present

## 2017-03-11 DIAGNOSIS — F0280 Dementia in other diseases classified elsewhere without behavioral disturbance: Secondary | ICD-10-CM

## 2017-03-11 DIAGNOSIS — F419 Anxiety disorder, unspecified: Secondary | ICD-10-CM

## 2017-03-11 NOTE — Progress Notes (Signed)
Location:  [place] Room Number: 242A Place of Service:  SNF (31)  [name], MD  Patient Care Team: [name], MD as PCP - General (Internal Medicine) [name], MD (Gastroenterology) [name], NP (Obstetrics and Gynecology)  Extended Emergency Contact Information Primary Emergency Contact: [name],[name] Address: [address], [place] Phone: [phone] Mobile Phone: [phone] Relation: Daughter Secondary Emergency Contact: [name] Phone: [phone] Mobile Phone: [phone] Relation: Daughter    Allergies: Amoxicillin; Ampicillin; Depakote [valproic acid]; Macrobid [nitrofurantoin macrocrystal]; Penicillins; Azithromycin; Doxycycline; Escitalopram oxalate; Fioricet [butalbital-apap-caffeine]; Latex; Levofloxacin; Nsaids; Oxycodone; Restasis [cyclosporine]; Sulfa antibiotics; and Biaxin [clarithromycin]  Chief Complaint  Patient presents with  . Medical Management of Chronic Issues    Routine Visit    HPI: [name] who is being seen for routine issues of dementia, anxiety, and bipolar disease  Past Medical History:  Diagnosis Date  . Anxiety   . Arterial tortuosity (aorta) 12/11/2012  . Asthma   . Chronic kidney disease, stage IV (severe) ([place]) 12/11/2012  . Chronic mental illness   . CKD (chronic kidney disease), stage III ([place]) 01/14/2015  . Closed hip fracture requiring operative repair, right, with routine healing, subsequent encounter 12/19/2015  . Colitis   . COPD (chronic obstructive pulmonary disease) ([place])   . Decreased rectal sphincter tone 11/21/2012  . Depression   . Drug-seeking behavior 02/26/2011   Use of multiple md to obtain benzos/narcotics pt must sign contract to receive controlled meds.   Marland Kitchen Dysphagia 01/11/2015  . GERD (gastroesophageal reflux disease)   . Glaucoma  06/26/2011  . Glaucoma of both eyes   . Hearing loss, sensorineural 03/04/2014   [facility]; 02/26/14. There is not a medical or surgical treatment that would benefit her type of hearing loss. We discussed amplification in an overview fashion.    Marland Kitchen History of fall   . Insomnia, unspecified   . Macular degeneration 06/26/2011  . Migraines   . Nasal bone fracture 03/30/2014  . Osteoporosis   . Osteoporosis 02/26/2011  . Presence of right artificial hip joint   . Protein-calorie malnutrition, severe ([place]) 12/10/2012  . S/P ORIF (open reduction internal fixation) fracture 01/08/2016  . Schatzki's ring 01/14/2015  . Scoliosis   . Substance abuse ([place])    Benbzos and Narcotics, she does not get any of those medicines from [facility], we have empathetically told her that.  . Vertigo   . Vitamin B deficiency   . Vitamin D deficiency 08/25/2016  . Weight loss     Past Surgical History:  Procedure Laterality Date  . ABDOMINAL HYSTERECTOMY    . CATARACT EXTRACTION    . CHOLECYSTECTOMY    . ESOPHAGOGASTRODUODENOSCOPY (EGD) WITH PROPOFOL N/A 01/13/2015   Procedure: ESOPHAGOGASTRODUODENOSCOPY (EGD) WITH PROPOFOL;  Surgeon: [name], MD;  Location: [facility];  Service: Endoscopy;  Laterality: N/A;  . HIP ARTHROPLASTY Right 12/21/2015   Procedure: ARTHROPLASTY BIPOLAR HIP (HEMIARTHROPLASTY);  Surgeon: [name], MD;  Location: [facility];  Service: Orthopedics;  Laterality: Right;  . PARTIAL HYSTERECTOMY      Allergies as of 03/11/2017      Reactions   Amoxicillin    Per daughter   Ampicillin    Per daughter   Depakote [valproic Acid]    DOES NOT WANT HER TO TAKE IT==Makes her hair fall out= per  daughter who is poa   Macrobid [nitrofurantoin Macrocrystal] Rash   Per MAR   Penicillins    Per daughter   Azithromycin Other (See Comments)   Per MAR   Doxycycline Other (See Comments)   Per MAR   Escitalopram Oxalate Other (See Comments)   Per MAR   Fioricet  [butalbital-apap-caffeine] Other (See Comments)   Drunk. Per MAR   Latex Other (See Comments)   Per MAR   Levofloxacin Other (See Comments)   Per MAR   Nsaids Other (See Comments)   This is not an allergy. The patient was told by Dr. Rockne Menghini to avoid NSAIDs and stop Vicoprofen in to 2014 to avoid nephrotoxicity. Per MAR.   Oxycodone Other (See Comments)   reaction to synthetic codeine Per MAR   Restasis [cyclosporine] Other (See Comments)   Per MAR   Sulfa Antibiotics Other (See Comments)   Per MAR   Biaxin [clarithromycin] Rash   With burning sensation Per [facility]      Medication List        Accurate as of 03/11/17 11:59 PM. Always use your most recent med list.          acetaminophen 325 MG tablet Commonly known as:  TYLENOL Take 650 mg by mouth every 6 (six) hours as needed for headache.   albuterol 108 (90 Base) MCG/ACT inhaler Commonly known as:  PROVENTIL HFA;VENTOLIN HFA Inhale 1 puff into the lungs every 6 (six) hours as needed for shortness of breath.   Biotin 5000 MCG Caps Take 1 capsule by mouth daily.   bisacodyl 10 MG suppository Commonly known as:  DULCOLAX Place 10 mg rectally as needed for moderate constipation.   clonazePAM 0.5 MG tablet Commonly known as:  KLONOPIN Take 0.5 mg by mouth 2 (two) times daily.   Cranberry 425 MG Caps Take 425 mg by mouth 2 (two) times daily.   docusate sodium 100 MG capsule Commonly known as:  COLACE Take 100 mg by mouth 2 (two) times daily.   dorzolamide-timolol 22.3-6.8 MG/ML ophthalmic solution Commonly known as:  COSOPT Place 1 drop into both eyes 2 (two) times daily.   ENSURE Take 237 mLs by mouth 4 (four) times daily. May have additional ensure if requested d/t  weight loss   ferrous sulfate 325 (65 FE) MG tablet Take 325 mg by mouth daily.   fludrocortisone 0.1 MG tablet Commonly known as:  FLORINEF Take 0.1 mg by mouth. Take one tablet every morning for orthostatic hypotension     HYDROcodone-acetaminophen 5-325 MG tablet Commonly known as:  NORCO/VICODIN Take 1 tablet by mouth 3 (three) times daily. At 9 am , 1 pm , and 9 pm for pain   lactulose 10 GM/15ML solution Commonly known as:  CHRONULAC Take 15 mLs (10 g total) by mouth daily as needed for severe constipation.   lidocaine 5 % Commonly known as:  LIDODERM Place 1 patch onto the skin every 12 (twelve) hours as needed (for pain). Remove & Discard patch within 12 hours or as directed by MD   magnesium hydroxide 400 MG/5ML suspension Commonly known as:  MILK OF MAGNESIA Take 30 mLs by mouth daily as needed for mild constipation.   nitroGLYCERIN 0.4 MG SL tablet Commonly known as:  NITROSTAT Place 0.4 mg under the tongue every 5 (five) minutes as needed for chest pain. May use 3 times   polyethylene glycol packet Commonly known as:  MIRALAX / GLYCOLAX Take 17 g by mouth daily.   QUEtiapine  300 MG tablet Commonly known as:  SEROQUEL Take 300 mg by mouth at bedtime.   RA SALINE ENEMA 19-7 GM/118ML Enem Place 118 mLs rectally daily as needed (for constipation).   senna 8.6 MG tablet Commonly known as:  SENOKOT Take 2 tablets by mouth 2 (two) times daily as needed for constipation.   travoprost (benzalkonium) 0.004 % ophthalmic solution Commonly known as:  TRAVATAN Place 1 drop into both eyes at bedtime.   Vitamin D 1000 units capsule Take 1,000 Units by mouth daily with breakfast.       No orders of the defined types were placed in this encounter.   Immunization History  Administered Date(s) Administered  . Influenza Split 11/13/2011  . Influenza-Unspecified 02/13/2008, 11/30/2013, 12/20/2016  . PPD Test 12/15/2015  . Pneumococcal-Unspecified 02/13/2008  . Td 02/12/2009    Social History   Tobacco Use  . Smoking status: Former Smoker    Packs/day: 0.50    Types: Cigarettes  . Smokeless tobacco: Never Used  Substance Use Topics  . Alcohol use: No    Review of Systems  DATA  OBTAINED: from patient-very limited; nursing-no concerns GENERAL:  no fevers, fatigue, appetite changes SKIN: No itching, rash HEENT: No complaint RESPIRATORY: No cough, wheezing, SOB CARDIAC: No chest pain, palpitations, lower extremity edema  GI: No abdominal pain, No N/V/D or constipation, No heartburn or reflux  GU: No dysuria, frequency or urgency, or incontinence  MUSCULOSKELETAL: No unrelieved bone/joint pain NEUROLOGIC: No headache, dizziness  PSYCHIATRIC: No overt anxiety or sadness  Vitals:   03/11/17 1343  BP: 112/75  Pulse: 86  Resp: 18  Temp: 97.7 F (36.5 C)  SpO2: 97%   Body mass index is 19.43 kg/m. Physical Exam  GENERAL APPEARANCE: Alert, minimally conversant, No acute distress  SKIN: No diaphoresis rash HEENT: Unremarkable RESPIRATORY: Breathing is even, unlabored. Lung sounds are clear   CARDIOVASCULAR: Heart RRR no murmurs, rubs or gallops. No peripheral edema  GASTROINTESTINAL: Abdomen is soft, non-tender, not distended w/ normal bowel sounds.  GENITOURINARY: Bladder non tender, not distended  MUSCULOSKELETAL: No abnormal joints or musculature NEUROLOGIC: Cranial nerves 2-12 grossly intact. Moves all extremities PSYCHIATRIC: Mood and affect with dementia, no behavioral issues  Patient Active Problem List   Diagnosis Date Noted  . Abnormal EKG 01/24/2017  . Delusions ([place])   . Paranoia ([place])   . Vitamin D deficiency 08/25/2016  . Dementia without behavioral disturbance 03/25/2016  . S/P ORIF (open reduction internal fixation) fracture 01/08/2016  . Oral thrush 01/08/2016  . HTN (hypertension) 01/08/2016  . Closed hip fracture requiring operative repair, right, with routine healing, subsequent encounter 12/19/2015  . Bipolar disease, chronic ([place]) 12/17/2015  . Decubitus ulcer of hip, stage 3 ([place]) 12/11/2015  . Legally blind 08/16/2015  . Ankle pain   . Encephalopathy   . Malnutrition of moderate degree 01/29/2015  . Altered mental status  01/26/2015  . Nausea and vomiting 01/17/2015  . Esophageal dysmotilities   . Urinary tract infectious disease   . Weakness 01/14/2015  . Schatzki's ring 01/14/2015  . CKD (chronic kidney disease), stage III ([place]) 01/14/2015  . Esophagus disorder   . Dysphagia 01/11/2015  . UTI (lower urinary tract infection) 03/30/2014  . Hearing loss, sensorineural 03/04/2014  . Nasal lesion 03/04/2014  . Unspecified constipation 12/12/2012  . Hypokalemia 12/11/2012  . Orthostatic hypotension 12/11/2012  . Arterial tortuosity (aorta) 12/11/2012  . Protein-calorie malnutrition, severe ([place]) 12/10/2012  . Decreased rectal sphincter tone 11/21/2012  . Primary open angle  glaucoma of both eyes, severe stage 11/11/2012  . Idiopathic scoliosis 07/08/2012  . Macular atrophy, retinal 06/26/2011  . Vision disturbance 06/26/2011  . Glaucoma 06/26/2011  . Nonexudative senile macular degeneration of retina 06/21/2011  . Macula scar of posterior pole 06/21/2011  . Status post intraocular lens implant 06/21/2011  . Anemia of chronic disease 04/17/2011  . Drug-seeking behavior 02/26/2011  . Osteoporosis 02/26/2011  . Depression   . Anxiety   . COPD (chronic obstructive pulmonary disease) ([place])   . GERD (gastroesophageal reflux disease)   . Migraines   . Insomnia, unspecified     CMP     Component Value Date/Time   NA 142 12/12/2016 1428   NA 145 06/11/2016   K 3.6 12/12/2016 1428   CL 109 12/12/2016 1428   CO2 22 12/12/2016 1428   GLUCOSE 90 12/12/2016 1428   BUN 14 12/12/2016 1428   BUN 20 06/11/2016   CREATININE 0.67 12/12/2016 1428   CREATININE 0.85 11/09/2013 1135   CALCIUM 9.3 12/12/2016 1428   PROT 6.5 12/12/2016 1428   ALBUMIN 3.4 (L) 12/12/2016 1428   AST 20 12/12/2016 1428   ALT 16 12/12/2016 1428   ALKPHOS 85 12/12/2016 1428   BILITOT 0.7 12/12/2016 1428   GFRNONAA >60 12/12/2016 1428   GFRNONAA 64 11/09/2013 1135   GFRAA >60 12/12/2016 1428   GFRAA 74 11/09/2013 1135   Recent  Labs    06/11/16 09/03/16 2005 12/12/16 1428  NA 145 139 142  K 4.6 3.9 3.6  CL  --  105 109  CO2  --  25 22  GLUCOSE  --  117* 90  BUN 20 22* 14  CREATININE 0.6 0.70 0.67  CALCIUM  --  9.3 9.3   Recent Labs    06/11/16 09/03/16 2005 12/12/16 1428  AST 16 22 20   ALT 9 12* 16  ALKPHOS 89 106 85  BILITOT  --  0.3 0.7  PROT  --  7.0 6.5  ALBUMIN  --  3.7 3.4*   Recent Labs    05/22/16 09/03/16 2005 12/12/16 1428  WBC 9.6 8.0 7.8  NEUTROABS  --  4.5 4.6  HGB 12.0 11.3* 12.8  HCT 40 35.6* 39.9  MCV  --  77.6* 80.4  PLT 235 320 239   Recent Labs    06/11/16  CHOL 190  LDLCALC 116  TRIG 101   No results found for: [facility] Lab Results  Component Value Date   TSH 2.61 06/11/2016   Lab Results  Component Value Date   HGBA1C 5.4 06/11/2016   Lab Results  Component Value Date   CHOL 190 06/11/2016   HDL 54 06/11/2016   LDLCALC 116 06/11/2016   TRIG 101 06/11/2016    Significant Diagnostic Results in last 30 days:  No results found.  Assessment and Plan  Dementia without behavioral disturbance Chronic and slowly progressive; continue supportive care  Anxiety Patient did well controlled on clonazepam 0.5 mg twice a day; continue current medication  Bipolar disease, chronic (HCC) Reasonable control; Seroquel has been increased now to 300 mg by mouth daily at bedtime; we'll monitor response     Webb Silversmith D. [name], MD

## 2017-03-12 DIAGNOSIS — F0390 Unspecified dementia without behavioral disturbance: Secondary | ICD-10-CM | POA: Diagnosis not present

## 2017-03-12 DIAGNOSIS — R1314 Dysphagia, pharyngoesophageal phase: Secondary | ICD-10-CM | POA: Diagnosis not present

## 2017-03-14 DIAGNOSIS — F0390 Unspecified dementia without behavioral disturbance: Secondary | ICD-10-CM | POA: Diagnosis not present

## 2017-03-14 DIAGNOSIS — R1314 Dysphagia, pharyngoesophageal phase: Secondary | ICD-10-CM | POA: Diagnosis not present

## 2017-03-15 ENCOUNTER — Non-Acute Institutional Stay (SKILLED_NURSING_FACILITY): Payer: Medicare Other | Admitting: Internal Medicine

## 2017-03-15 ENCOUNTER — Other Ambulatory Visit (HOSPITAL_COMMUNITY): Payer: Self-pay | Admitting: Internal Medicine

## 2017-03-15 ENCOUNTER — Encounter: Payer: Self-pay | Admitting: Internal Medicine

## 2017-03-15 DIAGNOSIS — N390 Urinary tract infection, site not specified: Secondary | ICD-10-CM | POA: Diagnosis not present

## 2017-03-15 DIAGNOSIS — R1314 Dysphagia, pharyngoesophageal phase: Secondary | ICD-10-CM | POA: Diagnosis not present

## 2017-03-15 DIAGNOSIS — F23 Brief psychotic disorder: Secondary | ICD-10-CM

## 2017-03-15 DIAGNOSIS — Z79899 Other long term (current) drug therapy: Secondary | ICD-10-CM | POA: Diagnosis not present

## 2017-03-15 DIAGNOSIS — F0390 Unspecified dementia without behavioral disturbance: Secondary | ICD-10-CM | POA: Diagnosis not present

## 2017-03-15 DIAGNOSIS — E708 Other disorders of aromatic amino-acid metabolism: Secondary | ICD-10-CM | POA: Diagnosis not present

## 2017-03-15 DIAGNOSIS — R8290 Unspecified abnormal findings in urine: Secondary | ICD-10-CM | POA: Diagnosis not present

## 2017-03-15 DIAGNOSIS — R1310 Dysphagia, unspecified: Secondary | ICD-10-CM

## 2017-03-15 NOTE — Progress Notes (Signed)
Location:  [place] Room Number: [place]:  SNF [phone])  [name], MD  Patient Care Team: [name], MD as PCP - General (Internal Medicine) [name], MD (Gastroenterology) [name], NP (Obstetrics and Gynecology)  Extended Emergency Contact Information Primary Emergency Contact: [name],[name] Address: [place], [place] Phone: [phone] Mobile Phone: [phone] Relation: Daughter Secondary Emergency Contact: [name] Phone: [phone] Mobile Phone: [phone] Relation: Daughter    Allergies: Amoxicillin; Ampicillin; Depakote [valproic acid]; Macrobid [nitrofurantoin macrocrystal]; Penicillins; Azithromycin; Doxycycline; Escitalopram oxalate; Fioricet [butalbital-apap-caffeine]; Latex; Levofloxacin; Nsaids; Oxycodone; Restasis [cyclosporine]; Sulfa antibiotics; and Biaxin [clarithromycin]  Chief Complaint  Patient presents with  . Acute Visit    pyschosis    HPI: [name] who nursing asked me to see for possible UTI. Urine has sediment. Urine is very cloudy. She is having more hallucinations than is her baseline. Patient is not having low abdominal pain, which is her usual presentation for UTI and is not having any dysuria. Patient without fever, nausea, vomiting.  Past Medical History:  Diagnosis Date  . Anxiety   . Arterial tortuosity (aorta) 12/11/2012  . Asthma   . Chronic kidney disease, stage IV (severe) ([place]) 12/11/2012  . Chronic mental illness   . CKD (chronic kidney disease), stage III ([place]) 01/14/2015  . Closed hip fracture requiring operative repair, right, with routine healing, subsequent encounter 12/19/2015  . Colitis   . COPD (chronic obstructive pulmonary disease) ([place])   . Decreased rectal sphincter tone 11/21/2012  . Depression   . Drug-seeking behavior  02/26/2011   Use of multiple md to obtain benzos/narcotics pt must sign contract to receive controlled meds.   Marland Kitchen Dysphagia 01/11/2015  . GERD (gastroesophageal reflux disease)   . Glaucoma 06/26/2011  . Glaucoma of both eyes   . Hearing loss, sensorineural 03/04/2014   [facility]; 02/26/14. There is not a medical or surgical treatment that would benefit her type of hearing loss. We discussed amplification in an overview fashion.    Marland Kitchen History of fall   . Insomnia, unspecified   . Macular degeneration 06/26/2011  . Migraines   . Nasal bone fracture 03/30/2014  . Osteoporosis   . Osteoporosis 02/26/2011  . Presence of right artificial hip joint   . Protein-calorie malnutrition, severe ([place]) 12/10/2012  . S/P ORIF (open reduction internal fixation) fracture 01/08/2016  . Schatzki's ring 01/14/2015  . Scoliosis   . Substance abuse ([place])    Benbzos and Narcotics, she does not get any of those medicines from [facility], we have empathetically told her that.  . Vertigo   . Vitamin B deficiency   . Vitamin D deficiency 08/25/2016  . Weight loss     Past Surgical History:  Procedure Laterality Date  . ABDOMINAL HYSTERECTOMY    . CATARACT EXTRACTION    . CHOLECYSTECTOMY    . ESOPHAGOGASTRODUODENOSCOPY (EGD) WITH PROPOFOL N/A 01/13/2015   Procedure: ESOPHAGOGASTRODUODENOSCOPY (EGD) WITH PROPOFOL;  Surgeon: [name], MD;  Location: [facility];  Service: Endoscopy;  Laterality: N/A;  . HIP ARTHROPLASTY Right 12/21/2015   Procedure: ARTHROPLASTY BIPOLAR HIP (HEMIARTHROPLASTY);  Surgeon: [name], MD;  Location: [place];  Service: Orthopedics;  Laterality: Right;  . PARTIAL HYSTERECTOMY      Allergies as of 03/15/2017      Reactions   Amoxicillin  Per daughter   Ampicillin    Per daughter   Depakote [valproic Acid]    DOES NOT WANT HER TO TAKE IT==Makes her hair fall out= per daughter who is poa   Macrobid [nitrofurantoin Macrocrystal] Rash   Per MAR   Penicillins    Per  daughter   Azithromycin Other (See Comments)   Per MAR   Doxycycline Other (See Comments)   Per MAR   Escitalopram Oxalate Other (See Comments)   Per MAR   Fioricet [butalbital-apap-caffeine] Other (See Comments)   Drunk. Per MAR   Latex Other (See Comments)   Per MAR   Levofloxacin Other (See Comments)   Per MAR   Nsaids Other (See Comments)   This is not an allergy. The patient was told by Dr. Rockne Menghini to avoid NSAIDs and stop Vicoprofen in to 2014 to avoid nephrotoxicity. Per MAR.   Oxycodone Other (See Comments)   reaction to synthetic codeine Per MAR   Restasis [cyclosporine] Other (See Comments)   Per MAR   Sulfa Antibiotics Other (See Comments)   Per MAR   Biaxin [clarithromycin] Rash   With burning sensation Per [facility]      Medication List        Accurate as of 03/15/17  1:13 PM. Always use your most recent med list.          acetaminophen 325 MG tablet Commonly known as:  TYLENOL Take 650 mg by mouth every 6 (six) hours as needed for headache.   albuterol 108 (90 Base) MCG/ACT inhaler Commonly known as:  PROVENTIL HFA;VENTOLIN HFA Inhale 1 puff into the lungs every 6 (six) hours as needed for shortness of breath.   Biotin 5000 MCG Caps Take 1 capsule by mouth daily.   bisacodyl 10 MG suppository Commonly known as:  DULCOLAX Place 10 mg rectally as needed for moderate constipation.   clonazePAM 0.5 MG tablet Commonly known as:  KLONOPIN Take 0.5 mg by mouth 2 (two) times daily.   Cranberry 425 MG Caps Take 425 mg by mouth 2 (two) times daily.   docusate sodium 100 MG capsule Commonly known as:  COLACE Take 100 mg by mouth 2 (two) times daily.   dorzolamide-timolol 22.3-6.8 MG/ML ophthalmic solution Commonly known as:  COSOPT Place 1 drop into both eyes 2 (two) times daily.   ferrous sulfate 325 (65 FE) MG tablet Take 325 mg by mouth daily.   fludrocortisone 0.1 MG tablet Commonly known as:  FLORINEF Take 0.1 mg by mouth. Take one tablet every  morning for orthostatic hypotension   HYDROcodone-acetaminophen 5-325 MG tablet Commonly known as:  NORCO/VICODIN Take 1 tablet by mouth 3 (three) times daily. At 9 am , 1 pm , and 9 pm for pain   lactulose 10 GM/15ML solution Commonly known as:  CHRONULAC Take 15 mLs (10 g total) by mouth daily as needed for severe constipation.   lidocaine 5 % Commonly known as:  LIDODERM Place 1 patch onto the skin every 12 (twelve) hours as needed (for pain). Remove & Discard patch within 12 hours or as directed by MD   magnesium hydroxide 400 MG/5ML suspension Commonly known as:  MILK OF MAGNESIA Take 30 mLs by mouth daily as needed for mild constipation.   nitroGLYCERIN 0.4 MG SL tablet Commonly known as:  NITROSTAT Place 0.4 mg under the tongue every 5 (five) minutes as needed for chest pain. May use 3 times   polyethylene glycol packet Commonly known as:  MIRALAX / GLYCOLAX Take  17 g by mouth daily.   QUEtiapine 300 MG tablet Commonly known as:  SEROQUEL Take 300 mg by mouth at bedtime.   senna 8.6 MG tablet Commonly known as:  SENOKOT Take 2 tablets by mouth 2 (two) times daily as needed for constipation.   travoprost (benzalkonium) 0.004 % ophthalmic solution Commonly known as:  TRAVATAN Place 1 drop into both eyes at bedtime.   Vitamin D 1000 units capsule Take 1,000 Units by mouth daily with breakfast.       No orders of the defined types were placed in this encounter.   Immunization History  Administered Date(s) Administered  . Influenza Split 11/13/2011  . Influenza-Unspecified 02/13/2008, 11/30/2013, 12/20/2016  . PPD Test 12/15/2015  . Pneumococcal-Unspecified 02/13/2008  . Td 02/12/2009    Social History   Tobacco Use  . Smoking status: Former Smoker    Packs/day: 0.50    Types: Cigarettes  . Smokeless tobacco: Never Used  Substance Use Topics  . Alcohol use: No    Review of Systems  DATA OBTAINED: from patient-limited; nursing-as per history of  present illness  GENERAL:  no fevers, fatigue, appetite changes SKIN: No itching, rash HEENT: No complaint RESPIRATORY: No cough, wheezing, SOB CARDIAC: No chest pain, palpitations, lower extremity edema  GI: No abdominal pain, No N/V/D or constipation, No heartburn or reflux  GU: No dysuria, frequency or urgency, or incontinence ; cloudy urine with sediment  MUSCULOSKELETAL: No unrelieved bone/joint pain NEUROLOGIC: No headache, dizziness  PSYCHIATRIC: Hallucinations  Vitals:   03/15/17 1248  BP: 112/75  Pulse: 86  Resp: 18  Temp: 97.7 F (36.5 C)   Body mass index is 19.43 kg/m. Physical Exam  GENERAL APPEARANCE: Alert,  minimally conversant, No acute distress  SKIN: No diaphoresis rash HEENT: Unremarkable RESPIRATORY: Breathing is even, unlabored. Lung sounds are clear   CARDIOVASCULAR: Heart RRR no murmurs, rubs or gallops. No peripheral edema  GASTROINTESTINAL: Abdomen is soft, non-tender, not distended w/ normal bowel sounds.  GENITOURINARY: Bladder non tender, not distended  MUSCULOSKELETAL: No abnormal joints or musculature NEUROLOGIC: Cranial nerves 2-12 grossly intact. Moves all extremities PSYCHIATRIC: Mood and affect with dementia, no behavioral issues  Patient Active Problem List   Diagnosis Date Noted  . Abnormal EKG 01/24/2017  . Delusions ([place])   . Paranoia ([place])   . Vitamin D deficiency 08/25/2016  . Dementia without behavioral disturbance 03/25/2016  . S/P ORIF (open reduction internal fixation) fracture 01/08/2016  . Oral thrush 01/08/2016  . HTN (hypertension) 01/08/2016  . Closed hip fracture requiring operative repair, right, with routine healing, subsequent encounter 12/19/2015  . Bipolar disease, chronic ([place]) 12/17/2015  . Decubitus ulcer of hip, stage 3 ([place]) 12/11/2015  . Legally blind 08/16/2015  . Ankle pain   . Encephalopathy   . Malnutrition of moderate degree 01/29/2015  . Altered mental status 01/26/2015  . Nausea and vomiting  01/17/2015  . Esophageal dysmotilities   . Urinary tract infectious disease   . Weakness 01/14/2015  . Schatzki's ring 01/14/2015  . CKD (chronic kidney disease), stage III ([place]) 01/14/2015  . Esophagus disorder   . Dysphagia 01/11/2015  . UTI (lower urinary tract infection) 03/30/2014  . Hearing loss, sensorineural 03/04/2014  . Nasal lesion 03/04/2014  . Unspecified constipation 12/12/2012  . Hypokalemia 12/11/2012  . Orthostatic hypotension 12/11/2012  . Arterial tortuosity (aorta) 12/11/2012  . Protein-calorie malnutrition, severe ([place]) 12/10/2012  . Decreased rectal sphincter tone 11/21/2012  . Primary open angle glaucoma of both eyes, severe stage  11/11/2012  . Idiopathic scoliosis 07/08/2012  . Macular atrophy, retinal 06/26/2011  . Vision disturbance 06/26/2011  . Glaucoma 06/26/2011  . Nonexudative senile macular degeneration of retina 06/21/2011  . Macula scar of posterior pole 06/21/2011  . Status post intraocular lens implant 06/21/2011  . Anemia of chronic disease 04/17/2011  . Drug-seeking behavior 02/26/2011  . Osteoporosis 02/26/2011  . Depression   . Anxiety   . COPD (chronic obstructive pulmonary disease) ([place])   . GERD (gastroesophageal reflux disease)   . Migraines   . Insomnia, unspecified     CMP     Component Value Date/Time   NA 142 12/12/2016 1428   NA 145 06/11/2016   K 3.6 12/12/2016 1428   CL 109 12/12/2016 1428   CO2 22 12/12/2016 1428   GLUCOSE 90 12/12/2016 1428   BUN 14 12/12/2016 1428   BUN 20 06/11/2016   CREATININE 0.67 12/12/2016 1428   CREATININE 0.85 11/09/2013 1135   CALCIUM 9.3 12/12/2016 1428   PROT 6.5 12/12/2016 1428   ALBUMIN 3.4 (L) 12/12/2016 1428   AST 20 12/12/2016 1428   ALT 16 12/12/2016 1428   ALKPHOS 85 12/12/2016 1428   BILITOT 0.7 12/12/2016 1428   GFRNONAA >60 12/12/2016 1428   GFRNONAA 64 11/09/2013 1135   GFRAA >60 12/12/2016 1428   GFRAA 74 11/09/2013 1135   Recent Labs    06/11/16 09/03/16 2005  12/12/16 1428  NA 145 139 142  K 4.6 3.9 3.6  CL  --  105 109  CO2  --  25 22  GLUCOSE  --  117* 90  BUN 20 22* 14  CREATININE 0.6 0.70 0.67  CALCIUM  --  9.3 9.3   Recent Labs    06/11/16 09/03/16 2005 12/12/16 1428  AST 16 22 20   ALT 9 12* 16  ALKPHOS 89 106 85  BILITOT  --  0.3 0.7  PROT  --  7.0 6.5  ALBUMIN  --  3.7 3.4*   Recent Labs    05/22/16 09/03/16 2005 12/12/16 1428  WBC 9.6 8.0 7.8  NEUTROABS  --  4.5 4.6  HGB 12.0 11.3* 12.8  HCT 40 35.6* 39.9  MCV  --  77.6* 80.4  PLT 235 320 239   Recent Labs    06/11/16  CHOL 190  LDLCALC 116  TRIG 101   No results found for: [facility] Lab Results  Component Value Date   TSH 2.61 06/11/2016   Lab Results  Component Value Date   HGBA1C 5.4 06/11/2016   Lab Results  Component Value Date   CHOL 190 06/11/2016   HDL 54 06/11/2016   LDLCALC 116 06/11/2016   TRIG 101 06/11/2016    Significant Diagnostic Results in last 30 days:  No results found.  Assessment and Plan  Abnormal urine/psychoses-clean-catch urine has been sent to [facility] lab; we'll monitor patient for fever abdominal tenderness or other symptom that may require an antibiotic before the antibiotic sensitivities come back    Dakwan Pridgen D. [name] MD

## 2017-03-16 ENCOUNTER — Encounter: Payer: Self-pay | Admitting: Internal Medicine

## 2017-03-16 NOTE — Assessment & Plan Note (Signed)
Reasonable control; Seroquel has been increased now to 300 mg by mouth daily at bedtime; we'll monitor response

## 2017-03-16 NOTE — Assessment & Plan Note (Signed)
Patient did well controlled on clonazepam 0.5 mg twice a day; continue current medication

## 2017-03-16 NOTE — Assessment & Plan Note (Signed)
Chronic and slowly progressive; continue supportive care 

## 2017-03-17 ENCOUNTER — Encounter: Payer: Self-pay | Admitting: Internal Medicine

## 2017-03-17 DIAGNOSIS — R1314 Dysphagia, pharyngoesophageal phase: Secondary | ICD-10-CM | POA: Diagnosis not present

## 2017-03-17 DIAGNOSIS — F0390 Unspecified dementia without behavioral disturbance: Secondary | ICD-10-CM | POA: Diagnosis not present

## 2017-03-18 ENCOUNTER — Encounter: Payer: Self-pay | Admitting: Internal Medicine

## 2017-03-18 ENCOUNTER — Non-Acute Institutional Stay (SKILLED_NURSING_FACILITY): Payer: Medicare Other | Admitting: Internal Medicine

## 2017-03-18 DIAGNOSIS — R8271 Bacteriuria: Secondary | ICD-10-CM | POA: Diagnosis not present

## 2017-03-18 DIAGNOSIS — F0390 Unspecified dementia without behavioral disturbance: Secondary | ICD-10-CM | POA: Diagnosis not present

## 2017-03-18 DIAGNOSIS — R1314 Dysphagia, pharyngoesophageal phase: Secondary | ICD-10-CM | POA: Diagnosis not present

## 2017-03-18 NOTE — Progress Notes (Signed)
Location:  [facility] Room Number: 315V Place of Service:  SNF [phone])  [name], MD  Patient Care Team: [name], MD as PCP - General (Internal Medicine) [name], MD (Gastroenterology) [name], NP (Obstetrics and Gynecology)  Extended Emergency Contact Information Primary Emergency Contact: [name],[name] Address: [address], [place] Phone: [phone] Mobile Phone: [phone] Relation: Daughter Secondary Emergency Contact: [name] Phone: [phone] Mobile Phone: [phone] Relation: Daughter    Allergies: Amoxicillin; Ampicillin; Depakote [valproic acid]; Macrobid [nitrofurantoin macrocrystal]; Penicillins; Azithromycin; Doxycycline; Escitalopram oxalate; Fioricet [butalbital-apap-caffeine]; Latex; Levofloxacin; Nsaids; Oxycodone; Restasis [cyclosporine]; Sulfa antibiotics; and Biaxin [clarithromycin]  Chief Complaint  Patient presents with  . Acute Visit    Possible UTI    HPI: [name] who is being seen because a urine culture returned with greater than 50,000 colonies of Klebsiella and greater than 75 colonies of enterococcus. At this point patient has no symptom other than increased hallucinations with a history of bipolar disease. No fever no bladder tenderness or pain which is her usual presentation for UTI. No nausea, vomiting. Nurses reported that patient's mental status has not worsened from 3 days prior.  Past Medical History:  Diagnosis Date  . Anxiety   . Arterial tortuosity (aorta) 12/11/2012  . Asthma   . Chronic kidney disease, stage IV (severe) ([place]) 12/11/2012  . Chronic mental illness   . CKD (chronic kidney disease), stage III ([place]) 01/14/2015  . Closed hip fracture requiring operative repair, right, with routine healing, subsequent encounter 12/19/2015  . Colitis   . COPD  (chronic obstructive pulmonary disease) ([place])   . Decreased rectal sphincter tone 11/21/2012  . Depression   . Drug-seeking behavior 02/26/2011   Use of multiple md to obtain benzos/narcotics pt must sign contract to receive controlled meds.   Marland Kitchen Dysphagia 01/11/2015  . GERD (gastroesophageal reflux disease)   . Glaucoma 06/26/2011  . Glaucoma of both eyes   . Hearing loss, sensorineural 03/04/2014   [facility]; 02/26/14. There is not a medical or surgical treatment that would benefit her type of hearing loss. We discussed amplification in an overview fashion.    Marland Kitchen History of fall   . Insomnia, unspecified   . Macular degeneration 06/26/2011  . Migraines   . Nasal bone fracture 03/30/2014  . Osteoporosis   . Osteoporosis 02/26/2011  . Presence of right artificial hip joint   . Protein-calorie malnutrition, severe ([place]) 12/10/2012  . S/P ORIF (open reduction internal fixation) fracture 01/08/2016  . Schatzki's ring 01/14/2015  . Scoliosis   . Substance abuse ([place])    Benbzos and Narcotics, she does not get any of those medicines from [facility], we have empathetically told her that.  . Vertigo   . Vitamin B deficiency   . Vitamin D deficiency 08/25/2016  . Weight loss     Past Surgical History:  Procedure Laterality Date  . ABDOMINAL HYSTERECTOMY    . CATARACT EXTRACTION    . CHOLECYSTECTOMY    . ESOPHAGOGASTRODUODENOSCOPY (EGD) WITH PROPOFOL N/A 01/13/2015   Procedure: ESOPHAGOGASTRODUODENOSCOPY (EGD) WITH PROPOFOL;  Surgeon: [name], MD;  Location: [facility];  Service: Endoscopy;  Laterality: N/A;  . HIP ARTHROPLASTY Right 12/21/2015   Procedure: ARTHROPLASTY BIPOLAR HIP (HEMIARTHROPLASTY);  Surgeon: [name], MD;  Location: [facility];  Service: Orthopedics;  Laterality: Right;  .  PARTIAL HYSTERECTOMY      Allergies as of 03/18/2017      Reactions   Amoxicillin    Per daughter   Ampicillin    Per daughter   Depakote [valproic Acid]    DOES NOT WANT HER TO TAKE  IT==Makes her hair fall out= per daughter who is poa   Macrobid [nitrofurantoin Macrocrystal] Rash   Per MAR   Penicillins    Per daughter   Azithromycin Other (See Comments)   Per MAR   Doxycycline Other (See Comments)   Per MAR   Escitalopram Oxalate Other (See Comments)   Per MAR   Fioricet [butalbital-apap-caffeine] Other (See Comments)   Drunk. Per MAR   Latex Other (See Comments)   Per MAR   Levofloxacin Other (See Comments)   Per MAR   Nsaids Other (See Comments)   This is not an allergy. The patient was told by Dr. Rockne Menghini to avoid NSAIDs and stop Vicoprofen in to 2014 to avoid nephrotoxicity. Per MAR.   Oxycodone Other (See Comments)   reaction to synthetic codeine Per MAR   Restasis [cyclosporine] Other (See Comments)   Per MAR   Sulfa Antibiotics Other (See Comments)   Per MAR   Biaxin [clarithromycin] Rash   With burning sensation Per [facility]      Medication List        Accurate as of 03/18/17 11:59 PM. Always use your most recent med list.          acetaminophen 325 MG tablet Commonly known as:  TYLENOL Take 650 mg by mouth every 6 (six) hours as needed for headache.   albuterol 108 (90 Base) MCG/ACT inhaler Commonly known as:  PROVENTIL HFA;VENTOLIN HFA Inhale 1 puff into the lungs every 6 (six) hours as needed for shortness of breath.   Biotin 5000 MCG Caps Take 1 capsule by mouth daily.   clonazePAM 0.5 MG tablet Commonly known as:  KLONOPIN Take 0.5 mg by mouth 2 (two) times daily.   Cranberry 425 MG Caps Take 425 mg by mouth 2 (two) times daily.   docusate sodium 100 MG capsule Commonly known as:  COLACE Take 100 mg by mouth 2 (two) times daily.   dorzolamide-timolol 22.3-6.8 MG/ML ophthalmic solution Commonly known as:  COSOPT Place 1 drop into both eyes 2 (two) times daily.   ENSURE Take 237 mLs by mouth 4 (four) times daily. D/t weight loss   ferrous sulfate 325 (65 FE) MG tablet Take 325 mg by mouth daily.   fludrocortisone 0.1 MG  tablet Commonly known as:  FLORINEF Take 0.1 mg by mouth every morning. for orthostatic hypotension   HYDROcodone-acetaminophen 5-325 MG tablet Commonly known as:  NORCO/VICODIN Take 1 tablet by mouth 3 (three) times daily. At 9 am , 1 pm , and 9 pm for pain   lactulose 10 GM/15ML solution Commonly known as:  CHRONULAC Take 15 mLs (10 g total) by mouth daily as needed for severe constipation.   lidocaine 5 % Commonly known as:  LIDODERM Place 1 patch onto the skin every 12 (twelve) hours as needed (for pain). Remove & Discard patch within 12 hours or as directed by MD   magnesium hydroxide 400 MG/5ML suspension Commonly known as:  MILK OF MAGNESIA Take 30 mLs by mouth daily as needed for mild constipation.   nitroGLYCERIN 0.4 MG SL tablet Commonly known as:  NITROSTAT Place 0.4 mg under the tongue every 5 (five) minutes as needed for chest pain. May use 3  times   polyethylene glycol packet Commonly known as:  MIRALAX / GLYCOLAX Take 17 g by mouth daily.   QUEtiapine 300 MG tablet Commonly known as:  SEROQUEL Take 300 mg by mouth at bedtime.   senna 8.6 MG tablet Commonly known as:  SENOKOT Take 2 tablets by mouth 2 (two) times daily as needed for constipation.   travoprost (benzalkonium) 0.004 % ophthalmic solution Commonly known as:  TRAVATAN Place 1 drop into both eyes at bedtime.   Vitamin D 1000 units capsule Take 1,000 Units by mouth daily with breakfast.       No orders of the defined types were placed in this encounter.   Immunization History  Administered Date(s) Administered  . Influenza Split 11/13/2011  . Influenza-Unspecified 02/13/2008, 11/30/2013, 12/20/2016  . PPD Test 12/15/2015  . Pneumococcal-Unspecified 02/13/2008  . Td 02/12/2009    Social History   Tobacco Use  . Smoking status: Former Smoker    Packs/day: 0.50    Types: Cigarettes  . Smokeless tobacco: Never Used  Substance Use Topics  . Alcohol use: No    Review of  Systems  DATA OBTAINED: from patient-limited; nursing-as per history of present illness GENERAL:  no fevers, fatigue, appetite changes SKIN: No itching, rash HEENT: No complaint RESPIRATORY: No cough, wheezing, SOB CARDIAC: No chest pain, palpitations, lower extremity edema  GI: No abdominal pain, No N/V/D or constipation, No heartburn or reflux  GU: No dysuria, frequency or urgency, or incontinence  MUSCULOSKELETAL: No unrelieved bone/joint pain NEUROLOGIC: No headache, dizziness  PSYCHIATRIC: No overt anxiety or sadness  Vitals:   03/18/17 1111  BP: 112/75  Pulse: 86  Resp: 18  Temp: 97.7 F (36.5 C)  SpO2: 97%   Body mass index is 19.43 kg/m. Physical Exam  GENERAL APPEARANCE: Alert, minimally conversant, No acute distress  SKIN: No diaphoresis rash HEENT: Unremarkable RESPIRATORY: Breathing is even, unlabored. Lung sounds are clear   CARDIOVASCULAR: Heart RRR no murmurs, rubs or gallops. No peripheral edema  GASTROINTESTINAL: Abdomen is soft, non-tender, not distended w/ normal bowel sounds.  GENITOURINARY: Bladder non tender, not distended  MUSCULOSKELETAL: No abnormal joints or musculature NEUROLOGIC: Cranial nerves 2-12 grossly intact. Moves all extremities PSYCHIATRIC: Mood and affect strange and with dementia, no behavioral issues  Patient Active Problem List   Diagnosis Date Noted  . Abnormal EKG 01/24/2017  . Delusions ([facility])   . Paranoia ([place])   . Vitamin D deficiency 08/25/2016  . Dementia without behavioral disturbance 03/25/2016  . S/P ORIF (open reduction internal fixation) fracture 01/08/2016  . Oral thrush 01/08/2016  . HTN (hypertension) 01/08/2016  . Closed hip fracture requiring operative repair, right, with routine healing, subsequent encounter 12/19/2015  . Bipolar disease, chronic ([place]) 12/17/2015  . Decubitus ulcer of hip, stage 3 ([place]) 12/11/2015  . Legally blind 08/16/2015  . Ankle pain   . Encephalopathy   . Malnutrition of moderate  degree 01/29/2015  . Altered mental status 01/26/2015  . Nausea and vomiting 01/17/2015  . Esophageal dysmotilities   . Urinary tract infectious disease   . Weakness 01/14/2015  . Schatzki's ring 01/14/2015  . CKD (chronic kidney disease), stage III ([place]) 01/14/2015  . Esophagus disorder   . Dysphagia 01/11/2015  . UTI (lower urinary tract infection) 03/30/2014  . Hearing loss, sensorineural 03/04/2014  . Nasal lesion 03/04/2014  . Unspecified constipation 12/12/2012  . Hypokalemia 12/11/2012  . Orthostatic hypotension 12/11/2012  . Arterial tortuosity (aorta) 12/11/2012  . Protein-calorie malnutrition, severe ([place]) 12/10/2012  .  Decreased rectal sphincter tone 11/21/2012  . Primary open angle glaucoma of both eyes, severe stage 11/11/2012  . Idiopathic scoliosis 07/08/2012  . Macular atrophy, retinal 06/26/2011  . Vision disturbance 06/26/2011  . Glaucoma 06/26/2011  . Nonexudative senile macular degeneration of retina 06/21/2011  . Macula scar of posterior pole 06/21/2011  . Status post intraocular lens implant 06/21/2011  . Anemia of chronic disease 04/17/2011  . Drug-seeking behavior 02/26/2011  . Osteoporosis 02/26/2011  . Depression   . Anxiety   . COPD (chronic obstructive pulmonary disease) ([place])   . GERD (gastroesophageal reflux disease)   . Migraines   . Insomnia, unspecified     CMP     Component Value Date/Time   NA 142 12/12/2016 1428   NA 145 06/11/2016   K 3.6 12/12/2016 1428   CL 109 12/12/2016 1428   CO2 22 12/12/2016 1428   GLUCOSE 90 12/12/2016 1428   BUN 14 12/12/2016 1428   BUN 20 06/11/2016   CREATININE 0.67 12/12/2016 1428   CREATININE 0.85 11/09/2013 1135   CALCIUM 9.3 12/12/2016 1428   PROT 6.5 12/12/2016 1428   ALBUMIN 3.4 (L) 12/12/2016 1428   AST 20 12/12/2016 1428   ALT 16 12/12/2016 1428   ALKPHOS 85 12/12/2016 1428   BILITOT 0.7 12/12/2016 1428   GFRNONAA >60 12/12/2016 1428   GFRNONAA 64 11/09/2013 1135   GFRAA >60 12/12/2016  1428   GFRAA 74 11/09/2013 1135   Recent Labs    06/11/16 09/03/16 2005 12/12/16 1428  NA 145 139 142  K 4.6 3.9 3.6  CL  --  105 109  CO2  --  25 22  GLUCOSE  --  117* 90  BUN 20 22* 14  CREATININE 0.6 0.70 0.67  CALCIUM  --  9.3 9.3   Recent Labs    06/11/16 09/03/16 2005 12/12/16 1428  AST 16 22 20   ALT 9 12* 16  ALKPHOS 89 106 85  BILITOT  --  0.3 0.7  PROT  --  7.0 6.5  ALBUMIN  --  3.7 3.4*   Recent Labs    05/22/16 09/03/16 2005 12/12/16 1428  WBC 9.6 8.0 7.8  NEUTROABS  --  4.5 4.6  HGB 12.0 11.3* 12.8  HCT 40 35.6* 39.9  MCV  --  77.6* 80.4  PLT 235 320 239   Recent Labs    06/11/16  CHOL 190  LDLCALC 116  TRIG 101   No results found for: [facility] Lab Results  Component Value Date   TSH 2.61 06/11/2016   Lab Results  Component Value Date   HGBA1C 5.4 06/11/2016   Lab Results  Component Value Date   CHOL 190 06/11/2016   HDL 54 06/11/2016   LDLCALC 116 06/11/2016   TRIG 101 06/11/2016    Significant Diagnostic Results in last 30 days:  No results found.  Assessment and Plan  Abnormal urine/bacteriuria -  greater than 50,000 Klebsiella, greater than 75,000 enterococcus, not technically a urinary tract infection and the patient is with no symptoms other than increased hallucinations. In addition patient is allergic to every single drug  organisms are sensitive to except for Zosyn; patient has allergy to penicillin, Macrobid, doxycycline, sulfa, levofloxacin, and azithromycin. Have asked the nurses to get another clean-catch UA and a CBC with differential; will continue to monitor patient for any signs of illness.    [name], MD

## 2017-03-19 DIAGNOSIS — R319 Hematuria, unspecified: Secondary | ICD-10-CM | POA: Diagnosis not present

## 2017-03-19 DIAGNOSIS — F0390 Unspecified dementia without behavioral disturbance: Secondary | ICD-10-CM | POA: Diagnosis not present

## 2017-03-19 DIAGNOSIS — N390 Urinary tract infection, site not specified: Secondary | ICD-10-CM | POA: Diagnosis not present

## 2017-03-19 DIAGNOSIS — R1314 Dysphagia, pharyngoesophageal phase: Secondary | ICD-10-CM | POA: Diagnosis not present

## 2017-03-19 DIAGNOSIS — I10 Essential (primary) hypertension: Secondary | ICD-10-CM | POA: Diagnosis not present

## 2017-03-19 DIAGNOSIS — D649 Anemia, unspecified: Secondary | ICD-10-CM | POA: Diagnosis not present

## 2017-03-19 LAB — CBC
HCT: 48.6
HEMOGLOBIN: 15.6
WBC: 7.8
platelet count: 284

## 2017-03-20 ENCOUNTER — Encounter: Payer: Self-pay | Admitting: *Deleted

## 2017-03-21 DIAGNOSIS — F0390 Unspecified dementia without behavioral disturbance: Secondary | ICD-10-CM | POA: Diagnosis not present

## 2017-03-21 DIAGNOSIS — R1314 Dysphagia, pharyngoesophageal phase: Secondary | ICD-10-CM | POA: Diagnosis not present

## 2017-03-22 DIAGNOSIS — F29 Unspecified psychosis not due to a substance or known physiological condition: Secondary | ICD-10-CM | POA: Diagnosis not present

## 2017-03-22 DIAGNOSIS — F0390 Unspecified dementia without behavioral disturbance: Secondary | ICD-10-CM | POA: Diagnosis not present

## 2017-03-22 DIAGNOSIS — F411 Generalized anxiety disorder: Secondary | ICD-10-CM | POA: Diagnosis not present

## 2017-03-22 DIAGNOSIS — R441 Visual hallucinations: Secondary | ICD-10-CM | POA: Diagnosis not present

## 2017-03-22 DIAGNOSIS — R1314 Dysphagia, pharyngoesophageal phase: Secondary | ICD-10-CM | POA: Diagnosis not present

## 2017-03-25 DIAGNOSIS — R1314 Dysphagia, pharyngoesophageal phase: Secondary | ICD-10-CM | POA: Diagnosis not present

## 2017-03-25 DIAGNOSIS — F0390 Unspecified dementia without behavioral disturbance: Secondary | ICD-10-CM | POA: Diagnosis not present

## 2017-03-26 ENCOUNTER — Ambulatory Visit (HOSPITAL_COMMUNITY)
Admission: RE | Admit: 2017-03-26 | Discharge: 2017-03-26 | Disposition: A | Discharge status: Home / Self Care | Payer: Medicare Other | Source: Ambulatory Visit | Attending: Internal Medicine | Admitting: Internal Medicine

## 2017-03-26 DIAGNOSIS — R05 Cough: Secondary | ICD-10-CM | POA: Diagnosis not present

## 2017-03-26 DIAGNOSIS — R1310 Dysphagia, unspecified: Secondary | ICD-10-CM | POA: Insufficient documentation

## 2017-03-26 DIAGNOSIS — F0390 Unspecified dementia without behavioral disturbance: Secondary | ICD-10-CM | POA: Diagnosis not present

## 2017-03-26 DIAGNOSIS — R1314 Dysphagia, pharyngoesophageal phase: Secondary | ICD-10-CM | POA: Diagnosis not present

## 2017-03-26 DIAGNOSIS — R1312 Dysphagia, oropharyngeal phase: Secondary | ICD-10-CM | POA: Diagnosis not present

## 2017-03-26 NOTE — Progress Notes (Signed)
Modified Barium Swallow Progress Note  Patient Details  Name: Taylor Roth MRN: [id number] Date of Birth: 07/23/1928  Today's Date: 03/26/2017  Modified Barium Swallow completed.  Full report located under Chart Review in the Imaging Section.  Brief recommendations include the following:  Clinical Impression  Pt demonstrates severe oropharyngeal dysphagia characterized by delayed swallow initiation, and hyolaryngeal weakness with reduced laryngeal closure. Deficts result in silent aspiration of thin and nectar thick liquids before and during the swallow with vallecular residuals residuals that penetrate post swallow. A chin tuck significantly reduced quantity of penetration and severity of vallecular residuals with nectar thick liquids and solids, though persistent silent aspiration occurred with thin liquids despite utilization of a chin tuck. Oral and lingual manipulation of soft solids was adequate despite missing dentition. Esophageal sweep revealed poor transit of a whole tablet through the cervical esophagus, but otherwise there was no significant stasis. Defer diet recommendations to pt/family and primary SLP.   Swallow Evaluation Recommendations       SLP Diet Recommendations: Dysphagia 2 (Fine chop) solids;Thin liquid;Nectar thick liquid(defer decisision to primary caregivers)   Liquid Administration via: Straw   Medication Administration: Crushed with puree   Supervision: Patient able to self feed   Compensations: Chin tuck                Curtisha Bendix, Katherene Ponto 03/26/2017,3:19 PM

## 2017-03-27 DIAGNOSIS — F0390 Unspecified dementia without behavioral disturbance: Secondary | ICD-10-CM | POA: Diagnosis not present

## 2017-03-27 DIAGNOSIS — R1314 Dysphagia, pharyngoesophageal phase: Secondary | ICD-10-CM | POA: Diagnosis not present

## 2017-03-28 DIAGNOSIS — F0390 Unspecified dementia without behavioral disturbance: Secondary | ICD-10-CM | POA: Diagnosis not present

## 2017-03-28 DIAGNOSIS — R1314 Dysphagia, pharyngoesophageal phase: Secondary | ICD-10-CM | POA: Diagnosis not present

## 2017-03-29 DIAGNOSIS — R1314 Dysphagia, pharyngoesophageal phase: Secondary | ICD-10-CM | POA: Diagnosis not present

## 2017-03-29 DIAGNOSIS — F0390 Unspecified dementia without behavioral disturbance: Secondary | ICD-10-CM | POA: Diagnosis not present

## 2017-04-01 DIAGNOSIS — F0390 Unspecified dementia without behavioral disturbance: Secondary | ICD-10-CM | POA: Diagnosis not present

## 2017-04-01 DIAGNOSIS — R1314 Dysphagia, pharyngoesophageal phase: Secondary | ICD-10-CM | POA: Diagnosis not present

## 2017-04-02 DIAGNOSIS — R440 Auditory hallucinations: Secondary | ICD-10-CM | POA: Diagnosis not present

## 2017-04-02 DIAGNOSIS — R441 Visual hallucinations: Secondary | ICD-10-CM | POA: Diagnosis not present

## 2017-04-02 DIAGNOSIS — R1314 Dysphagia, pharyngoesophageal phase: Secondary | ICD-10-CM | POA: Diagnosis not present

## 2017-04-02 DIAGNOSIS — F0390 Unspecified dementia without behavioral disturbance: Secondary | ICD-10-CM | POA: Diagnosis not present

## 2017-04-02 DIAGNOSIS — F39 Unspecified mood [affective] disorder: Secondary | ICD-10-CM | POA: Diagnosis not present

## 2017-04-03 DIAGNOSIS — F0390 Unspecified dementia without behavioral disturbance: Secondary | ICD-10-CM | POA: Diagnosis not present

## 2017-04-03 DIAGNOSIS — R1314 Dysphagia, pharyngoesophageal phase: Secondary | ICD-10-CM | POA: Diagnosis not present

## 2017-04-04 DIAGNOSIS — R1314 Dysphagia, pharyngoesophageal phase: Secondary | ICD-10-CM | POA: Diagnosis not present

## 2017-04-04 DIAGNOSIS — F0390 Unspecified dementia without behavioral disturbance: Secondary | ICD-10-CM | POA: Diagnosis not present

## 2017-04-06 DIAGNOSIS — R1314 Dysphagia, pharyngoesophageal phase: Secondary | ICD-10-CM | POA: Diagnosis not present

## 2017-04-06 DIAGNOSIS — F0390 Unspecified dementia without behavioral disturbance: Secondary | ICD-10-CM | POA: Diagnosis not present

## 2017-04-07 DIAGNOSIS — R1314 Dysphagia, pharyngoesophageal phase: Secondary | ICD-10-CM | POA: Diagnosis not present

## 2017-04-07 DIAGNOSIS — F0390 Unspecified dementia without behavioral disturbance: Secondary | ICD-10-CM | POA: Diagnosis not present

## 2017-04-08 DIAGNOSIS — R1314 Dysphagia, pharyngoesophageal phase: Secondary | ICD-10-CM | POA: Diagnosis not present

## 2017-04-08 DIAGNOSIS — F0390 Unspecified dementia without behavioral disturbance: Secondary | ICD-10-CM | POA: Diagnosis not present

## 2017-04-09 ENCOUNTER — Non-Acute Institutional Stay (SKILLED_NURSING_FACILITY): Payer: Medicare Other | Admitting: Internal Medicine

## 2017-04-09 ENCOUNTER — Encounter: Payer: Self-pay | Admitting: Internal Medicine

## 2017-04-09 DIAGNOSIS — R1314 Dysphagia, pharyngoesophageal phase: Secondary | ICD-10-CM | POA: Diagnosis not present

## 2017-04-09 DIAGNOSIS — J449 Chronic obstructive pulmonary disease, unspecified: Secondary | ICD-10-CM | POA: Diagnosis not present

## 2017-04-09 DIAGNOSIS — E559 Vitamin D deficiency, unspecified: Secondary | ICD-10-CM | POA: Diagnosis not present

## 2017-04-09 DIAGNOSIS — I951 Orthostatic hypotension: Secondary | ICD-10-CM | POA: Diagnosis not present

## 2017-04-09 DIAGNOSIS — F0390 Unspecified dementia without behavioral disturbance: Secondary | ICD-10-CM | POA: Diagnosis not present

## 2017-04-09 NOTE — Progress Notes (Signed)
Location:  [place] Room Number: 304-D Place of Service:  SNF (31)  [name], MD  Patient Care Team: [name], MD as PCP - General (Internal Medicine) [name], MD (Gastroenterology) [name], NP (Obstetrics and Gynecology)  Extended Emergency Contact Information Primary Emergency Contact: [name],[name] Address: [place], [place] Phone: [phone] Mobile Phone: [phone] Relation: Daughter Secondary Emergency Contact: [name] Phone: [phone] Mobile Phone: [phone] Relation: Daughter    Allergies: Amoxicillin; Ampicillin; Depakote [valproic acid]; Macrobid [nitrofurantoin macrocrystal]; Penicillins; Azithromycin; Doxycycline; Escitalopram oxalate; Fioricet [butalbital-apap-caffeine]; Latex; Levofloxacin; Nsaids; Oxycodone; Restasis [cyclosporine]; Sulfa antibiotics; and Biaxin [clarithromycin]  Chief Complaint  Patient presents with  . Medical Management of Chronic Issues    Routine visit    HPI: [name] who   Past Medical History:  Diagnosis Date  . Anxiety   . Arterial tortuosity (aorta) 12/11/2012  . Asthma   . Chronic kidney disease, stage IV (severe) ([place]) 12/11/2012  . Chronic mental illness   . CKD (chronic kidney disease), stage III ([place]) 01/14/2015  . Closed hip fracture requiring operative repair, right, with routine healing, subsequent encounter 12/19/2015  . Colitis   . COPD (chronic obstructive pulmonary disease) ([place])   . Decreased rectal sphincter tone 11/21/2012  . Depression   . Drug-seeking behavior 02/26/2011   Use of multiple md to obtain benzos/narcotics pt must sign contract to receive controlled meds.   Marland Kitchen Dysphagia 01/11/2015  . GERD (gastroesophageal reflux disease)   . Glaucoma 06/26/2011  . Glaucoma of both eyes   . Hearing loss, sensorineural  03/04/2014   [facility] ENT; 02/26/14. There is not a medical or surgical treatment that would benefit her type of hearing loss. We discussed amplification in an overview fashion.    Marland Kitchen History of fall   . Insomnia, unspecified   . Macular degeneration 06/26/2011  . Migraines   . Nasal bone fracture 03/30/2014  . Osteoporosis   . Osteoporosis 02/26/2011  . Presence of right artificial hip joint   . Protein-calorie malnutrition, severe ([place]) 12/10/2012  . S/P ORIF (open reduction internal fixation) fracture 01/08/2016  . Schatzki's ring 01/14/2015  . Scoliosis   . Substance abuse ([place])    Benbzos and Narcotics, she does not get any of those medicines from [facility], we have empathetically told her that.  . Vertigo   . Vitamin B deficiency   . Vitamin D deficiency 08/25/2016  . Weight loss     Past Surgical History:  Procedure Laterality Date  . ABDOMINAL HYSTERECTOMY    . CATARACT EXTRACTION    . CHOLECYSTECTOMY    . ESOPHAGOGASTRODUODENOSCOPY (EGD) WITH PROPOFOL N/A 01/13/2015   Procedure: ESOPHAGOGASTRODUODENOSCOPY (EGD) WITH PROPOFOL;  Surgeon: [name], MD;  [facility];  Service: Endoscopy;  Laterality: N/A;  . HIP ARTHROPLASTY Right 12/21/2015   Procedure: ARTHROPLASTY BIPOLAR HIP (HEMIARTHROPLASTY);  Surgeon: [name], MD;  Location: [facility];  Service: Orthopedics;  Laterality: Right;  . PARTIAL HYSTERECTOMY      Allergies as of 04/09/2017      Reactions   Amoxicillin    Per daughter   Ampicillin    Per daughter   Depakote [valproic Acid]    DOES NOT WANT HER TO TAKE IT==Makes her hair fall out= per daughter who is poa   Macrobid [nitrofurantoin Macrocrystal] Rash  Per MAR   Penicillins    Per daughter   Azithromycin Other (See Comments)   Per MAR   Doxycycline Other (See Comments)   Per MAR   Escitalopram Oxalate Other (See Comments)   Per MAR   Fioricet [butalbital-apap-caffeine] Other (See Comments)   Drunk. Per MAR   Latex Other (See  Comments)   Per MAR   Levofloxacin Other (See Comments)   Per MAR   Nsaids Other (See Comments)   This is not an allergy. The patient was told by Dr. Rockne Menghini to avoid NSAIDs and stop Vicoprofen in to 2014 to avoid nephrotoxicity. Per MAR.   Oxycodone Other (See Comments)   reaction to synthetic codeine Per MAR   Restasis [cyclosporine] Other (See Comments)   Per MAR   Sulfa Antibiotics Other (See Comments)   Per MAR   Biaxin [clarithromycin] Rash   With burning sensation Per [facility]      Medication List        Accurate as of 04/09/17 11:37 AM. Always use your most recent med list.          acetaminophen 325 MG tablet Commonly known as:  TYLENOL Take 650 mg by mouth every 6 (six) hours as needed for headache.   albuterol 108 (90 Base) MCG/ACT inhaler Commonly known as:  PROVENTIL HFA;VENTOLIN HFA Inhale 1 puff into the lungs every 6 (six) hours as needed for shortness of breath.   Biotin 5000 MCG Caps Take 1 capsule by mouth daily.   clonazePAM 0.5 MG tablet Commonly known as:  KLONOPIN Take 0.5 mg by mouth 2 (two) times daily.   Cranberry 425 MG Caps Take 425 mg by mouth 2 (two) times daily.   docusate sodium 100 MG capsule Commonly known as:  COLACE Take 100 mg by mouth 2 (two) times daily.   dorzolamide-timolol 22.3-6.8 MG/ML ophthalmic solution Commonly known as:  COSOPT Place 1 drop into both eyes 2 (two) times daily.   ENSURE Take 237 mLs by mouth 4 (four) times daily. D/t weight loss   ferrous sulfate 325 (65 FE) MG tablet Take 325 mg by mouth daily.   fludrocortisone 0.1 MG tablet Commonly known as:  FLORINEF Take 0.1 mg by mouth every morning. for orthostatic hypotension   HYDROcodone-acetaminophen 5-325 MG tablet Commonly known as:  NORCO/VICODIN Take 1 tablet by mouth 3 (three) times daily. At 9 am , 1 pm , and 9 pm for pain   lactulose 10 GM/15ML solution Commonly known as:  CHRONULAC Take 15 mLs (10 g total) by mouth daily as needed for severe  constipation.   lidocaine 5 % Commonly known as:  LIDODERM Place 1 patch onto the skin every 12 (twelve) hours as needed (for pain). Remove & Discard patch within 12 hours or as directed by MD   magnesium hydroxide 400 MG/5ML suspension Commonly known as:  MILK OF MAGNESIA Take 30 mLs by mouth daily as needed for mild constipation.   mirtazapine 15 MG tablet Commonly known as:  REMERON Take 7.5 mg by mouth at bedtime.   nitroGLYCERIN 0.4 MG SL tablet Commonly known as:  NITROSTAT Place 0.4 mg under the tongue every 5 (five) minutes as needed for chest pain. May use 3 times   polyethylene glycol packet Commonly known as:  MIRALAX / GLYCOLAX Take 17 g by mouth daily.   QUEtiapine 300 MG tablet Commonly known as:  SEROQUEL Take 300 mg by mouth at bedtime. Give with a 50 mg tablet to = 350 mg  SEROQUEL XR 50 MG Tb24 24 hr tablet Generic drug:  QUEtiapine Take 50 mg by mouth at bedtime. Give with a 300 mg tablet to = 350 mg   senna 8.6 MG tablet Commonly known as:  SENOKOT Take 2 tablets by mouth 2 (two) times daily as needed for constipation.   travoprost (benzalkonium) 0.004 % ophthalmic solution Commonly known as:  TRAVATAN Place 1 drop into both eyes at bedtime.   vitamin B-12 1000 MCG tablet Commonly known as:  CYANOCOBALAMIN Take 1,000 mcg by mouth daily.   Vitamin D 1000 units capsule Take 1,000 Units by mouth daily with breakfast.       No orders of the defined types were placed in this encounter.   Immunization History  Administered Date(s) Administered  . Influenza Split 11/13/2011  . Influenza-Unspecified 02/13/2008, 11/30/2013, 12/20/2016  . PPD Test 12/15/2015  . Pneumococcal-Unspecified 02/13/2008, 10/16/2016  . Td 02/12/2009    Social History   Tobacco Use  . Smoking status: Former Smoker    Packs/day: 0.50    Types: Cigarettes  . Smokeless tobacco: Never Used  Substance Use Topics  . Alcohol use: No    Review of Systems  DATA  OBTAINED: from patient, nurse, medical record, family member GENERAL:  no fevers, fatigue, appetite changes SKIN: No itching, rash HEENT: No complaint RESPIRATORY: No cough, wheezing, SOB CARDIAC: No chest pain, palpitations, lower extremity edema  GI: No abdominal pain, No N/V/D or constipation, No heartburn or reflux  GU: No dysuria, frequency or urgency, or incontinence  MUSCULOSKELETAL: No unrelieved bone/joint pain NEUROLOGIC: No headache, dizziness  PSYCHIATRIC: No overt anxiety or sadness  Vitals:   04/09/17 1112  BP: 120/74  Pulse: 77  Resp: 17  Temp: (!) 97.4 F (36.3 C)  SpO2: 97%   Body mass index is 18.99 kg/m. Physical Exam  GENERAL APPEARANCE: Alert, conversant, No acute distress  SKIN: No diaphoresis rash HEENT: Unremarkable RESPIRATORY: Breathing is even, unlabored. Lung sounds are clear   CARDIOVASCULAR: Heart RRR no murmurs, rubs or gallops. No peripheral edema  GASTROINTESTINAL: Abdomen is soft, non-tender, not distended w/ normal bowel sounds.  GENITOURINARY: Bladder non tender, not distended  MUSCULOSKELETAL: No abnormal joints or musculature NEUROLOGIC: Cranial nerves 2-12 grossly intact. Moves all extremities PSYCHIATRIC: Mood and affect appropriate to situation, no behavioral issues  Patient Active Problem List   Diagnosis Date Noted  . Abnormal EKG 01/24/2017  . Delusions ([place])   . Paranoia ([place])   . Vitamin D deficiency 08/25/2016  . Dementia without behavioral disturbance 03/25/2016  . S/P ORIF (open reduction internal fixation) fracture 01/08/2016  . Oral thrush 01/08/2016  . HTN (hypertension) 01/08/2016  . Closed hip fracture requiring operative repair, right, with routine healing, subsequent encounter 12/19/2015  . Bipolar disease, chronic ([place]) 12/17/2015  . Decubitus ulcer of hip, stage 3 ([place]) 12/11/2015  . Legally blind 08/16/2015  . Ankle pain   . Encephalopathy   . Malnutrition of moderate degree 01/29/2015  . Altered mental  status 01/26/2015  . Nausea and vomiting 01/17/2015  . Esophageal dysmotilities   . Urinary tract infectious disease   . Weakness 01/14/2015  . Schatzki's ring 01/14/2015  . CKD (chronic kidney disease), stage III ([place]) 01/14/2015  . Esophagus disorder   . Dysphagia 01/11/2015  . UTI (lower urinary tract infection) 03/30/2014  . Hearing loss, sensorineural 03/04/2014  . Nasal lesion 03/04/2014  . Unspecified constipation 12/12/2012  . Hypokalemia 12/11/2012  . Orthostatic hypotension 12/11/2012  . Arterial tortuosity (aorta) 12/11/2012  .  Protein-calorie malnutrition, severe ([place]) 12/10/2012  . Decreased rectal sphincter tone 11/21/2012  . Primary open angle glaucoma of both eyes, severe stage 11/11/2012  . Idiopathic scoliosis 07/08/2012  . Macular atrophy, retinal 06/26/2011  . Vision disturbance 06/26/2011  . Glaucoma 06/26/2011  . Nonexudative senile macular degeneration of retina 06/21/2011  . Macula scar of posterior pole 06/21/2011  . Status post intraocular lens implant 06/21/2011  . Anemia of chronic disease 04/17/2011  . Drug-seeking behavior 02/26/2011  . Osteoporosis 02/26/2011  . Depression   . Anxiety   . COPD (chronic obstructive pulmonary disease) ([place])   . GERD (gastroesophageal reflux disease)   . Migraines   . Insomnia, unspecified     CMP     Component Value Date/Time   NA 142 12/12/2016 1428   NA 145 06/11/2016   K 3.6 12/12/2016 1428   CL 109 12/12/2016 1428   CO2 22 12/12/2016 1428   GLUCOSE 90 12/12/2016 1428   BUN 14 12/12/2016 1428   BUN 20 06/11/2016   CREATININE 0.67 12/12/2016 1428   CREATININE 0.85 11/09/2013 1135   CALCIUM 9.3 12/12/2016 1428   PROT 6.5 12/12/2016 1428   ALBUMIN 3.4 (L) 12/12/2016 1428   AST 20 12/12/2016 1428   ALT 16 12/12/2016 1428   ALKPHOS 85 12/12/2016 1428   BILITOT 0.7 12/12/2016 1428   GFRNONAA >60 12/12/2016 1428   GFRNONAA 64 11/09/2013 1135   GFRAA >60 12/12/2016 1428   GFRAA 74 11/09/2013 1135    Recent Labs    06/11/16 09/03/16 2005 12/12/16 1428  NA 145 139 142  K 4.6 3.9 3.6  CL  --  105 109  CO2  --  25 22  GLUCOSE  --  117* 90  BUN 20 22* 14  CREATININE 0.6 0.70 0.67  CALCIUM  --  9.3 9.3   Recent Labs    06/11/16 09/03/16 2005 12/12/16 1428  AST 16 22 20   ALT 9 12* 16  ALKPHOS 89 106 85  BILITOT  --  0.3 0.7  PROT  --  7.0 6.5  ALBUMIN  --  3.7 3.4*   Recent Labs    05/22/16 09/03/16 2005 12/12/16 1428 03/19/17  WBC 9.6 8.0 7.8 7.8  NEUTROABS  --  4.5 4.6  --   HGB 12.0 11.3* 12.8 15.6  HCT 40 35.6* 39.9 48.6  MCV  --  77.6* 80.4  --   PLT 235 320 239  --    Recent Labs    06/11/16  CHOL 190  LDLCALC 116  TRIG 101   No results found for: [facility] Lab Results  Component Value Date   TSH 2.61 06/11/2016   Lab Results  Component Value Date   HGBA1C 5.4 06/11/2016   Lab Results  Component Value Date   CHOL 190 06/11/2016   HDL 54 06/11/2016   LDLCALC 116 06/11/2016   TRIG 101 06/11/2016    Significant Diagnostic Results in last 30 days:  Dg Op Swallowing Func-medicare/speech Path  Result Date: 03/26/2017 Objective Swallowing Evaluation: Type of Study: MBS-Modified Barium Swallow Study  Patient Details Name: BRITTAIN SMITHEY MRN: [id number] Date of Birth: 1928-12-03 Today's Date: 03/26/2017 Time: SLP Start Time (ACUTE ONLY): 1150 -SLP Stop Time (ACUTE ONLY): 1220 SLP Time Calculation (min) (ACUTE ONLY): 30 min Past Medical History: Past Medical History: Diagnosis Date . Anxiety  . Arterial tortuosity (aorta) 12/11/2012 . Asthma  . Chronic kidney disease, stage IV (severe) ([place]) 12/11/2012 . Chronic mental illness  . CKD (  chronic kidney disease), stage III ([place]) 01/14/2015 . Closed hip fracture requiring operative repair, right, with routine healing, subsequent encounter 12/19/2015 . Colitis  . COPD (chronic obstructive pulmonary disease) ([place])  . Decreased rectal sphincter tone 11/21/2012 . Depression  . Drug-seeking behavior 02/26/2011  Use of  multiple md to obtain benzos/narcotics pt must sign contract to receive controlled meds.  Marland Kitchen Dysphagia 01/11/2015 . GERD (gastroesophageal reflux disease)  . Glaucoma 06/26/2011 . Glaucoma of both eyes  . Hearing loss, sensorineural 03/04/2014  [facility]; 02/26/14. There is not a medical or surgical treatment that would benefit her type of hearing loss. We discussed amplification in an overview fashion.   Marland Kitchen History of fall  . Insomnia, unspecified  . Macular degeneration 06/26/2011 . Migraines  . Nasal bone fracture 03/30/2014 . Osteoporosis  . Osteoporosis 02/26/2011 . Presence of right artificial hip joint  . Protein-calorie malnutrition, severe ([place]) 12/10/2012 . S/P ORIF (open reduction internal fixation) fracture 01/08/2016 . Schatzki's ring 01/14/2015 . Scoliosis  . Substance abuse ([place])   Benbzos and Narcotics, she does not get any of those medicines from [facility], we have empathetically told her that. . Vertigo  . Vitamin B deficiency  . Vitamin D deficiency 08/25/2016 . Weight loss  Past Surgical History: Past Surgical History: Procedure Laterality Date . ABDOMINAL HYSTERECTOMY   . CATARACT EXTRACTION   . CHOLECYSTECTOMY   . ESOPHAGOGASTRODUODENOSCOPY (EGD) WITH PROPOFOL N/A 01/13/2015  Procedure: ESOPHAGOGASTRODUODENOSCOPY (EGD) WITH PROPOFOL;  Surgeon: [name], MD;  [facility];  Service: Endoscopy;  Laterality: N/A; . HIP ARTHROPLASTY Right 12/21/2015  Procedure: ARTHROPLASTY BIPOLAR HIP (HEMIARTHROPLASTY);  Surgeon: [name], MD;  Location: [facility];  Service: Orthopedics;  Laterality: Right; . PARTIAL HYSTERECTOMY   HPI: Pt is an 84 year old female arrving for an outpatient MBS due to report of coughing during and after meals, reduced oral intake, refusing food, globus, wet vocal quality. Pts daughter reports pt has been requesting finely chopped solids instead of pureed food. Pt had MBS in 2017 showing severe oropharyngeal dysphagia with silent aspiration of thin and nectar thick  liquids.  No Data Recorded Assessment / Plan / Recommendation CHL IP CLINICAL IMPRESSIONS 03/26/2017 Clinical Impression Pt demonstrates severe oropharyngeal dysphagia characterized by delayed swallow initiation, and hyolaryngeal weakness with reduced laryngeal closure. Deficts result in silent aspiration of thin and nectar thick liquids before and during the swallow with vallecular residuals residuals that penetrate post swallow. A chin tuck significantly reduced quantity of penetration and severity of vallecular residuals with nectar thick liquids and solids, though persistent silent aspiration occurred with thin liquids despite utilization of a chin tuck. Oral and lingual manipulation of soft solids was adequate despite missing dentition. Esophageal sweep revealed poor transit of a whole tablet through the cervical esophagus, but otherwise there was no significant stasis. Defer diet recommendations to pt/family and primary SLP. SLP Visit Diagnosis Dysphagia, oropharyngeal phase (R13.12) Attention and concentration deficit following -- Frontal lobe and executive function deficit following -- Impact on safety and function Severe aspiration risk   CHL IP TREATMENT RECOMMENDATION 03/26/2017 Treatment Recommendations Defer treatment plan to f/u with SLP   Prognosis 12/27/2015 Prognosis for Safe Diet Advancement Guarded Barriers to Reach Goals -- Barriers/Prognosis Comment -- CHL IP DIET RECOMMENDATION 03/26/2017 SLP Diet Recommendations Dysphagia 2 (Fine chop) solids;Thin liquid;Nectar thick liquid Liquid Administration via Straw Medication Administration Crushed with puree Compensations Chin tuck Postural Changes --   CHL IP OTHER RECOMMENDATIONS 12/27/2015 Recommended Consults -- Oral Care Recommendations Oral  care BID Other Recommendations --   CHL IP FOLLOW UP RECOMMENDATIONS 03/26/2017 Follow up Recommendations Skilled Nursing facility   [facility] IP FREQUENCY AND DURATION 12/20/2015 Speech Therapy Frequency (ACUTE ONLY)  min 1 x/week Treatment Duration 1 week      CHL IP ORAL PHASE 03/26/2017 Oral Phase Impaired Oral - Pudding Teaspoon -- Oral - Pudding Cup -- Oral - Honey Teaspoon -- Oral - Honey Cup -- Oral - Nectar Teaspoon -- Oral - Nectar Cup Pocketing in anterior sulcus Oral - Nectar Straw Pocketing in anterior sulcus Oral - Thin Teaspoon -- Oral - Thin Cup Pocketing in anterior sulcus Oral - Thin Straw Pocketing in anterior sulcus Oral - Puree Delayed oral transit Oral - Mech Soft Delayed oral transit Oral - Regular -- Oral - Multi-Consistency -- Oral - Pill Delayed oral transit Oral Phase - Comment --  CHL IP PHARYNGEAL PHASE 03/26/2017 Pharyngeal Phase Impaired Pharyngeal- Pudding Teaspoon -- Pharyngeal -- Pharyngeal- Pudding Cup -- Pharyngeal -- Pharyngeal- Honey Teaspoon -- Pharyngeal -- Pharyngeal- Honey Cup -- Pharyngeal -- Pharyngeal- Nectar Teaspoon -- Pharyngeal -- Pharyngeal- Nectar Cup Delayed swallow initiation-pyriform sinuses;Reduced epiglottic inversion;Reduced anterior laryngeal mobility;Reduced laryngeal elevation;Reduced airway/laryngeal closure;Reduced tongue base retraction;Penetration/Aspiration before swallow;Penetration/Aspiration during swallow;Penetration/Apiration after swallow;Trace aspiration;Pharyngeal residue - valleculae;Compensatory strategies attempted (with notebox) Pharyngeal Material enters airway, passes BELOW cords without attempt by patient to eject out (silent aspiration);Material enters airway, CONTACTS cords and not ejected out;Material does not enter airway Pharyngeal- Nectar Straw Delayed swallow initiation-pyriform sinuses;Reduced epiglottic inversion;Reduced anterior laryngeal mobility;Reduced laryngeal elevation;Reduced airway/laryngeal closure;Reduced tongue base retraction;Penetration/Aspiration before swallow;Penetration/Aspiration during swallow;Penetration/Apiration after swallow;Trace aspiration;Pharyngeal residue - valleculae;Compensatory strategies attempted (with notebox)  Pharyngeal Material does not enter airway;Material enters airway, remains ABOVE vocal cords and not ejected out Pharyngeal- Thin Teaspoon -- Pharyngeal -- Pharyngeal- Thin Cup Delayed swallow initiation-pyriform sinuses;Reduced epiglottic inversion;Reduced anterior laryngeal mobility;Reduced laryngeal elevation;Reduced airway/laryngeal closure;Reduced tongue base retraction;Penetration/Aspiration before swallow;Penetration/Aspiration during swallow;Penetration/Apiration after swallow;Trace aspiration;Pharyngeal residue - valleculae;Compensatory strategies attempted (with notebox) Pharyngeal Material enters airway, passes BELOW cords without attempt by patient to eject out (silent aspiration) Pharyngeal- Thin Straw Delayed swallow initiation-pyriform sinuses;Reduced epiglottic inversion;Reduced anterior laryngeal mobility;Reduced laryngeal elevation;Reduced airway/laryngeal closure;Reduced tongue base retraction;Penetration/Aspiration before swallow;Penetration/Aspiration during swallow;Penetration/Apiration after swallow;Trace aspiration;Pharyngeal residue - valleculae;Compensatory strategies attempted (with notebox) Pharyngeal Material enters airway, passes BELOW cords without attempt by patient to eject out (silent aspiration) Pharyngeal- Puree Delayed swallow initiation-vallecula;Reduced anterior laryngeal mobility;Reduced laryngeal elevation;Reduced tongue base retraction;Reduced epiglottic inversion;Pharyngeal residue - valleculae Pharyngeal -- Pharyngeal- Mechanical Soft Delayed swallow initiation-vallecula;Reduced anterior laryngeal mobility;Reduced laryngeal elevation;Reduced tongue base retraction;Reduced epiglottic inversion;Pharyngeal residue - valleculae Pharyngeal -- Pharyngeal- Regular -- Pharyngeal -- Pharyngeal- Multi-consistency -- Pharyngeal -- Pharyngeal- Pill Delayed swallow initiation-vallecula;Other (Comment);Compensatory strategies attempted (with notebox) Pharyngeal -- Pharyngeal Comment --   CHL IP CERVICAL ESOPHAGEAL PHASE 12/27/2015 Cervical Esophageal Phase Impaired Pudding Teaspoon -- Pudding Cup -- Honey Teaspoon -- Honey Cup -- Nectar Teaspoon -- Nectar Cup -- Nectar Straw -- Thin Teaspoon -- Thin Cup -- Thin Straw -- Puree Esophageal backflow into cervical esophagus Mechanical Soft -- Regular -- Multi-consistency -- Pill (No Data) Cervical Esophageal Comment -- CHL IP GO 12/10/2012 Functional Assessment Tool Used clinical judgement Functional Limitations Swallowing Swallow Current Status (Y6378) CI Swallow Goal Status (H8850) [place] Swallow Discharge Status (Y7741) (None) Motor Speech Current Status (O8786) (None) Motor Speech Goal Status (V6720) (None) Motor Speech Goal Status (N4709) (None) Spoken Language Comprehension Current Status (G2836) (None) Spoken Language Comprehension Goal Status (O2947) (None) Spoken Language Comprehension Discharge Status (M5465) (None) Spoken Language Expression Current  Status [phone]) (None) Spoken Language Expression Goal Status [phone]) (None) Spoken Language Expression Discharge Status [phone]) (None) Attention Current Status [phone]) (None) Attention Goal Status (V7944) (None) Attention Discharge Status [phone]) (None) Memory Current Status (V2224) (None) Memory Goal Status (V1464) (None) Memory Discharge Status (V1427) (None) Voice Current Status (A7011) (None) Voice Goal Status (Y0349) (None) Voice Discharge Status [phone]) (None) Other Speech-Language Pathology Functional Limitation Current Status [phone]) (None) Other Speech-Language Pathology Functional Limitation Goal Status (S2583) (None) Other Speech-Language Pathology Functional Limitation Discharge Status [phone]) (None) DeBlois, Katherene Ponto 03/26/2017, 3:20 PM            EXAM: MODIFIED BARIUM SWALLOW TECHNIQUE: Different consistencies of barium were administered orally to the patient by the Speech Pathologist. Imaging of the pharynx was performed in the lateral projection. FLUOROSCOPY TIME:  Fluoroscopy  Time:  2.22 minutes Radiation Exposure Index (if provided by the fluoroscopic device): 18 mGy Number of Acquired Spot Images: 0 COMPARISON:  None. FINDINGS: Thin liquid-slightly delayed oral phase. Small vallecular residuals. Laryngeal penetration with tracheal aspiration. With chin-tuck maneuver, the laryngeal penetration tracheal aspiration resolved. Nectar thick liquid-delayed oral phase. Small vallecular residuals. No laryngeal penetration or tracheal aspiration. Pure-delayed oral phase, otherwise within normal limits Barium tablet-delayed oral phase, otherwise within normal limits IMPRESSION: 1. Modified barium swallow as described above. Please refer to the Speech Pathologists report for complete details and recommendations. Electronically Signed   By: Kathreen Devoid   On: 03/26/2017 12:57    Assessment   Labs/tests ordered:   This is not my note which brings over my assessment and plan; because of this I had to open up my note underneath this note; I have had to replace the small variables which notably my note over allow me to close my note Webb Silversmith D. [name], MD

## 2017-04-11 DIAGNOSIS — F0390 Unspecified dementia without behavioral disturbance: Secondary | ICD-10-CM | POA: Diagnosis not present

## 2017-04-11 DIAGNOSIS — R1314 Dysphagia, pharyngoesophageal phase: Secondary | ICD-10-CM | POA: Diagnosis not present

## 2017-04-13 ENCOUNTER — Encounter: Payer: Self-pay | Admitting: Internal Medicine

## 2017-04-13 DIAGNOSIS — R1314 Dysphagia, pharyngoesophageal phase: Secondary | ICD-10-CM | POA: Diagnosis not present

## 2017-04-13 DIAGNOSIS — F0390 Unspecified dementia without behavioral disturbance: Secondary | ICD-10-CM | POA: Diagnosis not present

## 2017-04-13 NOTE — Progress Notes (Addendum)
Location:  [place] Room Number: 304-D Place of Service:  SNF (31)  [name], MD  Patient Care Team: [name], MD as PCP - General (Internal Medicine) [name], MD (Gastroenterology) [name], NP (Obstetrics and Gynecology)  Extended Emergency Contact Information Primary Emergency Contact: [name],[name] Address: [place], [place] Phone: [phone] Mobile Phone: [phone] Relation: Daughter Secondary Emergency Contact: [name] Phone: [phone] Mobile Phone: [phone] Relation: Daughter    Allergies: Amoxicillin; Ampicillin; Depakote [valproic acid]; Macrobid [nitrofurantoin macrocrystal]; Penicillins; Azithromycin; Doxycycline; Escitalopram oxalate; Fioricet [butalbital-apap-caffeine]; Latex; Levofloxacin; Nsaids; Oxycodone; Restasis [cyclosporine]; Sulfa antibiotics; and Biaxin [clarithromycin]  Chief Complaint  Patient presents with  . Medical Management of Chronic Issues    Routine visit    HPI: [name] who is being seen for routine issues of orthostatic hypotension, COPD, and vitamin D deficiency.  Past Medical History:  Diagnosis Date  . Anxiety   . Arterial tortuosity (aorta) 12/11/2012  . Asthma   . Chronic kidney disease, stage IV (severe) ([place]) 12/11/2012  . Chronic mental illness   . CKD (chronic kidney disease), stage III ([place]) 01/14/2015  . Closed hip fracture requiring operative repair, right, with routine healing, subsequent encounter 12/19/2015  . Colitis   . COPD (chronic obstructive pulmonary disease) ([place])   . Decreased rectal sphincter tone 11/21/2012  . Depression   . Drug-seeking behavior 02/26/2011   Use of multiple md to obtain benzos/narcotics pt must sign contract to receive controlled meds.   Marland Kitchen Dysphagia 01/11/2015  . GERD (gastroesophageal reflux  disease)   . Glaucoma 06/26/2011  . Glaucoma of both eyes   . Hearing loss, sensorineural 03/04/2014   [facility] ENT; 02/26/14. There is not a medical or surgical treatment that would benefit her type of hearing loss. We discussed amplification in an overview fashion.    Marland Kitchen History of fall   . Insomnia, unspecified   . Macular degeneration 06/26/2011  . Migraines   . Nasal bone fracture 03/30/2014  . Osteoporosis   . Osteoporosis 02/26/2011  . Presence of right artificial hip joint   . Protein-calorie malnutrition, severe ([place]) 12/10/2012  . S/P ORIF (open reduction internal fixation) fracture 01/08/2016  . Schatzki's ring 01/14/2015  . Scoliosis   . Substance abuse ([place])    Benbzos and Narcotics, she does not get any of those medicines from [facility], we have empathetically told her that.  . Vertigo   . Vitamin B deficiency   . Vitamin D deficiency 08/25/2016  . Weight loss     Past Surgical History:  Procedure Laterality Date  . ABDOMINAL HYSTERECTOMY    . CATARACT EXTRACTION    . CHOLECYSTECTOMY    . ESOPHAGOGASTRODUODENOSCOPY (EGD) WITH PROPOFOL N/A 01/13/2015   Procedure: ESOPHAGOGASTRODUODENOSCOPY (EGD) WITH PROPOFOL;  Surgeon: [name], MD;  [facility]: [facility];  Service: Endoscopy;  Laterality: N/A;  . HIP ARTHROPLASTY Right 12/21/2015   Procedure: ARTHROPLASTY BIPOLAR HIP (HEMIARTHROPLASTY);  Surgeon: [name], MD;  Location: [place];  Service: Orthopedics;  Laterality: Right;  . PARTIAL HYSTERECTOMY      Allergies as of 04/09/2017      Reactions   Amoxicillin    Per daughter   Ampicillin    Per daughter   Depakote [valproic Acid]    DOES NOT WANT HER TO TAKE IT==Makes her hair fall  out= per daughter who is poa   Macrobid [nitrofurantoin Macrocrystal] Rash   Per MAR   Penicillins    Per daughter   Azithromycin Other (See Comments)   Per MAR   Doxycycline Other (See Comments)   Per MAR   Escitalopram Oxalate Other (See Comments)   Per MAR   Fioricet  [butalbital-apap-caffeine] Other (See Comments)   Drunk. Per MAR   Latex Other (See Comments)   Per MAR   Levofloxacin Other (See Comments)   Per MAR   Nsaids Other (See Comments)   This is not an allergy. The patient was told by Dr. Rockne Menghini to avoid NSAIDs and stop Vicoprofen in to 2014 to avoid nephrotoxicity. Per MAR.   Oxycodone Other (See Comments)   reaction to synthetic codeine Per MAR   Restasis [cyclosporine] Other (See Comments)   Per MAR   Sulfa Antibiotics Other (See Comments)   Per MAR   Biaxin [clarithromycin] Rash   With burning sensation Per [facility]      Medication List        Accurate as of 04/09/17 11:59 PM. Always use your most recent med list.          acetaminophen 325 MG tablet Commonly known as:  TYLENOL Take 650 mg by mouth every 6 (six) hours as needed for headache.   albuterol 108 (90 Base) MCG/ACT inhaler Commonly known as:  PROVENTIL HFA;VENTOLIN HFA Inhale 1 puff into the lungs every 6 (six) hours as needed for shortness of breath.   Biotin 5000 MCG Caps Take 1 capsule by mouth daily.   clonazePAM 0.5 MG tablet Commonly known as:  KLONOPIN Take 0.5 mg by mouth 2 (two) times daily.   Cranberry 425 MG Caps Take 425 mg by mouth 2 (two) times daily.   docusate sodium 100 MG capsule Commonly known as:  COLACE Take 100 mg by mouth 2 (two) times daily.   dorzolamide-timolol 22.3-6.8 MG/ML ophthalmic solution Commonly known as:  COSOPT Place 1 drop into both eyes 2 (two) times daily.   ENSURE Take 237 mLs by mouth 4 (four) times daily. D/t weight loss   ferrous sulfate 325 (65 FE) MG tablet Take 325 mg by mouth daily.   fludrocortisone 0.1 MG tablet Commonly known as:  FLORINEF Take 0.1 mg by mouth every morning. for orthostatic hypotension   HYDROcodone-acetaminophen 5-325 MG tablet Commonly known as:  NORCO/VICODIN Take 1 tablet by mouth 3 (three) times daily. At 9 am , 1 pm , and 9 pm for pain   lactulose 10 GM/15ML  solution Commonly known as:  CHRONULAC Take 15 mLs (10 g total) by mouth daily as needed for severe constipation.   lidocaine 5 % Commonly known as:  LIDODERM Place 1 patch onto the skin every 12 (twelve) hours as needed (for pain). Remove & Discard patch within 12 hours or as directed by MD   magnesium hydroxide 400 MG/5ML suspension Commonly known as:  MILK OF MAGNESIA Take 30 mLs by mouth daily as needed for mild constipation.   mirtazapine 15 MG tablet Commonly known as:  REMERON Take 7.5 mg by mouth at bedtime.   nitroGLYCERIN 0.4 MG SL tablet Commonly known as:  NITROSTAT Place 0.4 mg under the tongue every 5 (five) minutes as needed for chest pain. May use 3 times   polyethylene glycol packet Commonly known as:  MIRALAX / GLYCOLAX Take 17 g by mouth daily.   QUEtiapine 300 MG tablet Commonly known as:  SEROQUEL Take 300 mg  by mouth at bedtime. Give with a 50 mg tablet to = 350 mg   SEROQUEL XR 50 MG Tb24 24 hr tablet Generic drug:  QUEtiapine Take 50 mg by mouth at bedtime. Give with a 300 mg tablet to = 350 mg   senna 8.6 MG tablet Commonly known as:  SENOKOT Take 2 tablets by mouth 2 (two) times daily as needed for constipation.   travoprost (benzalkonium) 0.004 % ophthalmic solution Commonly known as:  TRAVATAN Place 1 drop into both eyes at bedtime.   vitamin B-12 1000 MCG tablet Commonly known as:  CYANOCOBALAMIN Take 1,000 mcg by mouth daily.   Vitamin D 1000 units capsule Take 1,000 Units by mouth daily with breakfast.       No orders of the defined types were placed in this encounter.   Immunization History  Administered Date(s) Administered  . Influenza Split 11/13/2011  . Influenza-Unspecified 02/13/2008, 11/30/2013, 12/20/2016  . PPD Test 12/15/2015  . Pneumococcal-Unspecified 02/13/2008, 10/16/2016  . Td 02/12/2009    Social History   Tobacco Use  . Smoking status: Former Smoker    Packs/day: 0.50    Types: Cigarettes  . Smokeless  tobacco: Never Used  Substance Use Topics  . Alcohol use: No    Review of Systems  DATA OBTAINED: from patient-limited; nursing-no acute concerns GENERAL:  no fevers, fatigue, appetite changes SKIN: No itching, rash HEENT: No complaint RESPIRATORY: No cough, wheezing, SOB CARDIAC: No chest pain, palpitations, lower extremity edema  GI: No abdominal pain, No N/V/D or constipation, No heartburn or reflux  GU: No dysuria, frequency or urgency, or incontinence  MUSCULOSKELETAL: No unrelieved bone/joint pain NEUROLOGIC: No headache, dizziness  PSYCHIATRIC: No overt anxiety or sadness  Vitals:   04/09/17 1112  BP: 120/74  Pulse: 77  Resp: 17  Temp: (!) 97.4 F (36.3 C)  SpO2: 97%   Body mass index is 18.99 kg/m. Physical Exam  GENERAL APPEARANCE: Alert, minimally conversant, No acute distress  SKIN: No diaphoresis rash HEENT: Unremarkable RESPIRATORY: Breathing is even, unlabored. Lung sounds are clear   CARDIOVASCULAR: Heart RRR no murmurs, rubs or gallops. No peripheral edema  GASTROINTESTINAL: Abdomen is soft, non-tender, not distended w/ normal bowel sounds.  GENITOURINARY: Bladder non tender, not distended  MUSCULOSKELETAL: No abnormal joints or musculature NEUROLOGIC: Cranial nerves 2-12 grossly intact. Moves all extremities PSYCHIATRIC: Mood and affect dementia, no behavioral issues  Patient Active Problem List   Diagnosis Date Noted  . Abnormal EKG 01/24/2017  . Delusions ([place])   . Paranoia ([place])   . Vitamin D deficiency 08/25/2016  . Dementia without behavioral disturbance 03/25/2016  . S/P ORIF (open reduction internal fixation) fracture 01/08/2016  . Oral thrush 01/08/2016  . HTN (hypertension) 01/08/2016  . Closed hip fracture requiring operative repair, right, with routine healing, subsequent encounter 12/19/2015  . Bipolar disease, chronic ([place]) 12/17/2015  . Decubitus ulcer of hip, stage 3 ([place]) 12/11/2015  . Legally blind 08/16/2015  . Ankle pain    . Encephalopathy   . Malnutrition of moderate degree 01/29/2015  . Altered mental status 01/26/2015  . Nausea and vomiting 01/17/2015  . Esophageal dysmotilities   . Urinary tract infectious disease   . Weakness 01/14/2015  . Schatzki's ring 01/14/2015  . CKD (chronic kidney disease), stage III ([place]) 01/14/2015  . Esophagus disorder   . Dysphagia 01/11/2015  . UTI (lower urinary tract infection) 03/30/2014  . Hearing loss, sensorineural 03/04/2014  . Nasal lesion 03/04/2014  . Unspecified constipation 12/12/2012  .  Hypokalemia 12/11/2012  . Orthostatic hypotension 12/11/2012  . Arterial tortuosity (aorta) 12/11/2012  . Protein-calorie malnutrition, severe ([place]) 12/10/2012  . Decreased rectal sphincter tone 11/21/2012  . Primary open angle glaucoma of both eyes, severe stage 11/11/2012  . Idiopathic scoliosis 07/08/2012  . Macular atrophy, retinal 06/26/2011  . Vision disturbance 06/26/2011  . Glaucoma 06/26/2011  . Nonexudative senile macular degeneration of retina 06/21/2011  . Macula scar of posterior pole 06/21/2011  . Status post intraocular lens implant 06/21/2011  . Anemia of chronic disease 04/17/2011  . Drug-seeking behavior 02/26/2011  . Osteoporosis 02/26/2011  . Depression   . Anxiety   . COPD (chronic obstructive pulmonary disease) ([place])   . GERD (gastroesophageal reflux disease)   . Migraines   . Insomnia, unspecified     CMP     Component Value Date/Time   NA 142 12/12/2016 1428   NA 145 06/11/2016   K 3.6 12/12/2016 1428   CL 109 12/12/2016 1428   CO2 22 12/12/2016 1428   GLUCOSE 90 12/12/2016 1428   BUN 14 12/12/2016 1428   BUN 20 06/11/2016   CREATININE 0.67 12/12/2016 1428   CREATININE 0.85 11/09/2013 1135   CALCIUM 9.3 12/12/2016 1428   PROT 6.5 12/12/2016 1428   ALBUMIN 3.4 (L) 12/12/2016 1428   AST 20 12/12/2016 1428   ALT 16 12/12/2016 1428   ALKPHOS 85 12/12/2016 1428   BILITOT 0.7 12/12/2016 1428   GFRNONAA >60 12/12/2016 1428    GFRNONAA 64 11/09/2013 1135   GFRAA >60 12/12/2016 1428   GFRAA 74 11/09/2013 1135   Recent Labs    06/11/16 09/03/16 2005 12/12/16 1428  NA 145 139 142  K 4.6 3.9 3.6  CL  --  105 109  CO2  --  25 22  GLUCOSE  --  117* 90  BUN 20 22* 14  CREATININE 0.6 0.70 0.67  CALCIUM  --  9.3 9.3   Recent Labs    06/11/16 09/03/16 2005 12/12/16 1428  AST 16 22 20   ALT 9 12* 16  ALKPHOS 89 106 85  BILITOT  --  0.3 0.7  PROT  --  7.0 6.5  ALBUMIN  --  3.7 3.4*   Recent Labs    05/22/16 09/03/16 2005 12/12/16 1428 03/19/17  WBC 9.6 8.0 7.8 7.8  NEUTROABS  --  4.5 4.6  --   HGB 12.0 11.3* 12.8 15.6  HCT 40 35.6* 39.9 48.6  MCV  --  77.6* 80.4  --   PLT 235 320 239  --    Recent Labs    06/11/16  CHOL 190  LDLCALC 116  TRIG 101   No results found for: [facility] Lab Results  Component Value Date   TSH 2.61 06/11/2016   Lab Results  Component Value Date   HGBA1C 5.4 06/11/2016   Lab Results  Component Value Date   CHOL 190 06/11/2016   HDL 54 06/11/2016   LDLCALC 116 06/11/2016   TRIG 101 06/11/2016    Significant Diagnostic Results in last 30 days:  Dg Op Swallowing Func-medicare/speech Path  Result Date: 03/26/2017 Objective Swallowing Evaluation: Type of Study: MBS-Modified Barium Swallow Study  Patient Details Name: DEBAR PLATE MRN: [id number] Date of Birth: 04-02-1928 Today's Date: 03/26/2017 Time: SLP Start Time (ACUTE ONLY): 1150 -SLP Stop Time (ACUTE ONLY): 1220 SLP Time Calculation (min) (ACUTE ONLY): 30 min Past Medical History: Past Medical History: Diagnosis Date . Anxiety  . Arterial tortuosity (aorta) 12/11/2012 . Asthma  .  Chronic kidney disease, stage IV (severe) ([place]) 12/11/2012 . Chronic mental illness  . CKD (chronic kidney disease), stage III ([place]) 01/14/2015 . Closed hip fracture requiring operative repair, right, with routine healing, subsequent encounter 12/19/2015 . Colitis  . COPD (chronic obstructive pulmonary disease) ([place])  . Decreased  rectal sphincter tone 11/21/2012 . Depression  . Drug-seeking behavior 02/26/2011  Use of multiple md to obtain benzos/narcotics pt must sign contract to receive controlled meds.  Marland Kitchen Dysphagia 01/11/2015 . GERD (gastroesophageal reflux disease)  . Glaucoma 06/26/2011 . Glaucoma of both eyes  . Hearing loss, sensorineural 03/04/2014  [facility] ENT; 02/26/14. There is not a medical or surgical treatment that would benefit her type of hearing loss. We discussed amplification in an overview fashion.   Marland Kitchen History of fall  . Insomnia, unspecified  . Macular degeneration 06/26/2011 . Migraines  . Nasal bone fracture 03/30/2014 . Osteoporosis  . Osteoporosis 02/26/2011 . Presence of right artificial hip joint  . Protein-calorie malnutrition, severe ([place]) 12/10/2012 . S/P ORIF (open reduction internal fixation) fracture 01/08/2016 . Schatzki's ring 01/14/2015 . Scoliosis  . Substance abuse ([place])   Benbzos and Narcotics, she does not get any of those medicines from [facility], we have empathetically told her that. . Vertigo  . Vitamin B deficiency  . Vitamin D deficiency 08/25/2016 . Weight loss  Past Surgical History: Past Surgical History: Procedure Laterality Date . ABDOMINAL HYSTERECTOMY   . CATARACT EXTRACTION   . CHOLECYSTECTOMY   . ESOPHAGOGASTRODUODENOSCOPY (EGD) WITH PROPOFOL N/A 01/13/2015  Procedure: ESOPHAGOGASTRODUODENOSCOPY (EGD) WITH PROPOFOL;  Surgeon: [name], MD;  [facility];  Service: Endoscopy;  Laterality: N/A; . HIP ARTHROPLASTY Right 12/21/2015  Procedure: ARTHROPLASTY BIPOLAR HIP (HEMIARTHROPLASTY);  Surgeon: [name], MD;  Location: [place];  Service: Orthopedics;  Laterality: Right; . PARTIAL HYSTERECTOMY   HPI: Pt is an 84 year old female arrving for an outpatient MBS due to report of coughing during and after meals, reduced oral intake, refusing food, globus, wet vocal quality. Pts daughter reports pt has been requesting finely chopped solids instead of pureed food. Pt had MBS in 2017  showing severe oropharyngeal dysphagia with silent aspiration of thin and nectar thick liquids.  No Data Recorded Assessment / Plan / Recommendation CHL IP CLINICAL IMPRESSIONS 03/26/2017 Clinical Impression Pt demonstrates severe oropharyngeal dysphagia characterized by delayed swallow initiation, and hyolaryngeal weakness with reduced laryngeal closure. Deficts result in silent aspiration of thin and nectar thick liquids before and during the swallow with vallecular residuals residuals that penetrate post swallow. A chin tuck significantly reduced quantity of penetration and severity of vallecular residuals with nectar thick liquids and solids, though persistent silent aspiration occurred with thin liquids despite utilization of a chin tuck. Oral and lingual manipulation of soft solids was adequate despite missing dentition. Esophageal sweep revealed poor transit of a whole tablet through the cervical esophagus, but otherwise there was no significant stasis. Defer diet recommendations to pt/family and primary SLP. SLP Visit Diagnosis Dysphagia, oropharyngeal phase (R13.12) Attention and concentration deficit following -- Frontal lobe and executive function deficit following -- Impact on safety and function Severe aspiration risk   CHL IP TREATMENT RECOMMENDATION 03/26/2017 Treatment Recommendations Defer treatment plan to f/u with SLP   Prognosis 12/27/2015 Prognosis for Safe Diet Advancement Guarded Barriers to Reach Goals -- Barriers/Prognosis Comment -- CHL IP DIET RECOMMENDATION 03/26/2017 SLP Diet Recommendations Dysphagia 2 (Fine chop) solids;Thin liquid;Nectar thick liquid Liquid Administration via Straw Medication Administration Crushed with puree Compensations Chin tuck Postural Changes --  CHL IP OTHER RECOMMENDATIONS 12/27/2015 Recommended Consults -- Oral Care Recommendations Oral care BID Other Recommendations --   CHL IP FOLLOW UP RECOMMENDATIONS 03/26/2017 Follow up Recommendations Skilled Nursing  facility   [facility] IP FREQUENCY AND DURATION 12/20/2015 Speech Therapy Frequency (ACUTE ONLY) min 1 x/week Treatment Duration 1 week      CHL IP ORAL PHASE 03/26/2017 Oral Phase Impaired Oral - Pudding Teaspoon -- Oral - Pudding Cup -- Oral - Honey Teaspoon -- Oral - Honey Cup -- Oral - Nectar Teaspoon -- Oral - Nectar Cup Pocketing in anterior sulcus Oral - Nectar Straw Pocketing in anterior sulcus Oral - Thin Teaspoon -- Oral - Thin Cup Pocketing in anterior sulcus Oral - Thin Straw Pocketing in anterior sulcus Oral - Puree Delayed oral transit Oral - Mech Soft Delayed oral transit Oral - Regular -- Oral - Multi-Consistency -- Oral - Pill Delayed oral transit Oral Phase - Comment --  CHL IP PHARYNGEAL PHASE 03/26/2017 Pharyngeal Phase Impaired Pharyngeal- Pudding Teaspoon -- Pharyngeal -- Pharyngeal- Pudding Cup -- Pharyngeal -- Pharyngeal- Honey Teaspoon -- Pharyngeal -- Pharyngeal- Honey Cup -- Pharyngeal -- Pharyngeal- Nectar Teaspoon -- Pharyngeal -- Pharyngeal- Nectar Cup Delayed swallow initiation-pyriform sinuses;Reduced epiglottic inversion;Reduced anterior laryngeal mobility;Reduced laryngeal elevation;Reduced airway/laryngeal closure;Reduced tongue base retraction;Penetration/Aspiration before swallow;Penetration/Aspiration during swallow;Penetration/Apiration after swallow;Trace aspiration;Pharyngeal residue - valleculae;Compensatory strategies attempted (with notebox) Pharyngeal Material enters airway, passes BELOW cords without attempt by patient to eject out (silent aspiration);Material enters airway, CONTACTS cords and not ejected out;Material does not enter airway Pharyngeal- Nectar Straw Delayed swallow initiation-pyriform sinuses;Reduced epiglottic inversion;Reduced anterior laryngeal mobility;Reduced laryngeal elevation;Reduced airway/laryngeal closure;Reduced tongue base retraction;Penetration/Aspiration before swallow;Penetration/Aspiration during swallow;Penetration/Apiration after swallow;Trace  aspiration;Pharyngeal residue - valleculae;Compensatory strategies attempted (with notebox) Pharyngeal Material does not enter airway;Material enters airway, remains ABOVE vocal cords and not ejected out Pharyngeal- Thin Teaspoon -- Pharyngeal -- Pharyngeal- Thin Cup Delayed swallow initiation-pyriform sinuses;Reduced epiglottic inversion;Reduced anterior laryngeal mobility;Reduced laryngeal elevation;Reduced airway/laryngeal closure;Reduced tongue base retraction;Penetration/Aspiration before swallow;Penetration/Aspiration during swallow;Penetration/Apiration after swallow;Trace aspiration;Pharyngeal residue - valleculae;Compensatory strategies attempted (with notebox) Pharyngeal Material enters airway, passes BELOW cords without attempt by patient to eject out (silent aspiration) Pharyngeal- Thin Straw Delayed swallow initiation-pyriform sinuses;Reduced epiglottic inversion;Reduced anterior laryngeal mobility;Reduced laryngeal elevation;Reduced airway/laryngeal closure;Reduced tongue base retraction;Penetration/Aspiration before swallow;Penetration/Aspiration during swallow;Penetration/Apiration after swallow;Trace aspiration;Pharyngeal residue - valleculae;Compensatory strategies attempted (with notebox) Pharyngeal Material enters airway, passes BELOW cords without attempt by patient to eject out (silent aspiration) Pharyngeal- Puree Delayed swallow initiation-vallecula;Reduced anterior laryngeal mobility;Reduced laryngeal elevation;Reduced tongue base retraction;Reduced epiglottic inversion;Pharyngeal residue - valleculae Pharyngeal -- Pharyngeal- Mechanical Soft Delayed swallow initiation-vallecula;Reduced anterior laryngeal mobility;Reduced laryngeal elevation;Reduced tongue base retraction;Reduced epiglottic inversion;Pharyngeal residue - valleculae Pharyngeal -- Pharyngeal- Regular -- Pharyngeal -- Pharyngeal- Multi-consistency -- Pharyngeal -- Pharyngeal- Pill Delayed swallow initiation-vallecula;Other  (Comment);Compensatory strategies attempted (with notebox) Pharyngeal -- Pharyngeal Comment --  CHL IP CERVICAL ESOPHAGEAL PHASE 12/27/2015 Cervical Esophageal Phase Impaired Pudding Teaspoon -- Pudding Cup -- Honey Teaspoon -- Honey Cup -- Nectar Teaspoon -- Nectar Cup -- Nectar Straw -- Thin Teaspoon -- Thin Cup -- Thin Straw -- Puree Esophageal backflow into cervical esophagus Mechanical Soft -- Regular -- Multi-consistency -- Pill (No Data) Cervical Esophageal Comment -- CHL IP GO 12/10/2012 Functional Assessment Tool Used clinical judgement Functional Limitations Swallowing Swallow Current Status (Q2595) CI Swallow Goal Status (G3875) [place] Swallow Discharge Status (I4332) (None) Motor Speech Current Status (R5188) (None) Motor Speech Goal Status (C1660) (None) Motor Speech Goal Status (Y3016) (None) Spoken Language Comprehension Current Status (W1093) (None) Spoken Language Comprehension Goal Status (A3557) (  None) Spoken Language Comprehension Discharge Status [phone]) (None) Spoken Language Expression Current Status [phone]) (None) Spoken Language Expression Goal Status [phone]) (None) Spoken Language Expression Discharge Status [phone]) (None) Attention Current Status [phone]) (None) Attention Goal Status (Q3009) (None) Attention Discharge Status [phone]) (None) Memory Current Status (T6226) (None) Memory Goal Status (J3354) (None) Memory Discharge Status (T6256) (None) Voice Current Status (L8937) (None) Voice Goal Status (D4287) (None) Voice Discharge Status [phone]) (None) Other Speech-Language Pathology Functional Limitation Current Status [phone]) (None) Other Speech-Language Pathology Functional Limitation Goal Status (B5597) (None) Other Speech-Language Pathology Functional Limitation Discharge Status [phone]) (None) De[place], Katherene Ponto 03/26/2017, 3:20 PM            EXAM: MODIFIED BARIUM SWALLOW TECHNIQUE: Different consistencies of barium were administered orally to the patient by the Speech Pathologist.  Imaging of the pharynx was performed in the lateral projection. FLUOROSCOPY TIME:  Fluoroscopy Time:  2.22 minutes Radiation Exposure Index (if provided by the fluoroscopic device): 18 mGy Number of Acquired Spot Images: 0 COMPARISON:  None. FINDINGS: Thin liquid-slightly delayed oral phase. Small vallecular residuals. Laryngeal penetration with tracheal aspiration. With chin-tuck maneuver, the laryngeal penetration tracheal aspiration resolved. Nectar thick liquid-delayed oral phase. Small vallecular residuals. No laryngeal penetration or tracheal aspiration. Pure-delayed oral phase, otherwise within normal limits Barium tablet-delayed oral phase, otherwise within normal limits IMPRESSION: 1. Modified barium swallow as described above. Please refer to the Speech Pathologists report for complete details and recommendations. Electronically Signed   By: Kathreen Devoid   On: 03/26/2017 12:57    Assessment and Plan  Orthostatic hypotension Maintain; continue Florinef 0.1 mg by mouth daily at bedtime  COPD (chronic obstructive pulmonary disease) (HCC) Stable, no recent exacerbations; continue when necessary albuterol  Vitamin D deficiency Stable; continue thousand units by mouth daily    Inocencio Homes, MD

## 2017-04-13 NOTE — Assessment & Plan Note (Signed)
Maintain; continue Florinef 0.1 mg by mouth daily at bedtime

## 2017-04-13 NOTE — Assessment & Plan Note (Signed)
Stable; continue thousand units by mouth daily

## 2017-04-13 NOTE — Assessment & Plan Note (Signed)
Stable, no recent exacerbations; continue when necessary albuterol

## 2017-04-15 DIAGNOSIS — F0390 Unspecified dementia without behavioral disturbance: Secondary | ICD-10-CM | POA: Diagnosis not present

## 2017-04-15 DIAGNOSIS — F411 Generalized anxiety disorder: Secondary | ICD-10-CM | POA: Diagnosis not present

## 2017-04-15 DIAGNOSIS — R441 Visual hallucinations: Secondary | ICD-10-CM | POA: Diagnosis not present

## 2017-04-15 DIAGNOSIS — F39 Unspecified mood [affective] disorder: Secondary | ICD-10-CM | POA: Diagnosis not present

## 2017-04-17 DIAGNOSIS — F0390 Unspecified dementia without behavioral disturbance: Secondary | ICD-10-CM | POA: Diagnosis not present

## 2017-04-17 DIAGNOSIS — E708 Other disorders of aromatic amino-acid metabolism: Secondary | ICD-10-CM | POA: Diagnosis not present

## 2017-04-17 DIAGNOSIS — I10 Essential (primary) hypertension: Secondary | ICD-10-CM | POA: Diagnosis not present

## 2017-04-17 DIAGNOSIS — R1314 Dysphagia, pharyngoesophageal phase: Secondary | ICD-10-CM | POA: Diagnosis not present

## 2017-04-17 DIAGNOSIS — E0829 Diabetes mellitus due to underlying condition with other diabetic kidney complication: Secondary | ICD-10-CM | POA: Diagnosis not present

## 2017-04-17 DIAGNOSIS — E559 Vitamin D deficiency, unspecified: Secondary | ICD-10-CM | POA: Diagnosis not present

## 2017-04-17 DIAGNOSIS — E785 Hyperlipidemia, unspecified: Secondary | ICD-10-CM | POA: Diagnosis not present

## 2017-04-17 DIAGNOSIS — E119 Type 2 diabetes mellitus without complications: Secondary | ICD-10-CM | POA: Diagnosis not present

## 2017-04-17 DIAGNOSIS — E039 Hypothyroidism, unspecified: Secondary | ICD-10-CM | POA: Diagnosis not present

## 2017-04-29 DIAGNOSIS — F411 Generalized anxiety disorder: Secondary | ICD-10-CM | POA: Diagnosis not present

## 2017-04-29 DIAGNOSIS — R441 Visual hallucinations: Secondary | ICD-10-CM | POA: Diagnosis not present

## 2017-04-29 DIAGNOSIS — F39 Unspecified mood [affective] disorder: Secondary | ICD-10-CM | POA: Diagnosis not present

## 2017-04-29 DIAGNOSIS — R440 Auditory hallucinations: Secondary | ICD-10-CM | POA: Diagnosis not present

## 2017-05-02 ENCOUNTER — Encounter: Payer: Self-pay | Admitting: Internal Medicine

## 2017-05-02 ENCOUNTER — Non-Acute Institutional Stay (SKILLED_NURSING_FACILITY): Payer: Medicare Other | Admitting: Internal Medicine

## 2017-05-02 DIAGNOSIS — R0989 Other specified symptoms and signs involving the circulatory and respiratory systems: Secondary | ICD-10-CM | POA: Diagnosis not present

## 2017-05-02 DIAGNOSIS — K5901 Slow transit constipation: Secondary | ICD-10-CM

## 2017-05-02 DIAGNOSIS — D638 Anemia in other chronic diseases classified elsewhere: Secondary | ICD-10-CM

## 2017-05-02 DIAGNOSIS — N183 Chronic kidney disease, stage 3 unspecified: Secondary | ICD-10-CM

## 2017-05-02 DIAGNOSIS — R05 Cough: Secondary | ICD-10-CM | POA: Diagnosis not present

## 2017-05-02 NOTE — Progress Notes (Signed)
Location:  [facility] Room Number: 161W Place of Service:  SNF [phone])  [name], MD  Patient Care Team: [name], MD as PCP - General (Internal Medicine) [name], MD (Gastroenterology) [name], NP (Obstetrics and Gynecology)  Extended Emergency Contact Information Primary Emergency Contact: [name],[name] Address: [place], [place] Phone: [phone] Mobile Phone: [phone] Relation: Daughter Secondary Emergency Contact: [name] Phone: [phone] Mobile Phone: [phone] Relation: Daughter    Allergies: Amoxicillin; Ampicillin; Depakote [valproic acid]; Macrobid [nitrofurantoin macrocrystal]; Penicillins; Azithromycin; Doxycycline; Escitalopram oxalate; Fioricet [butalbital-apap-caffeine]; Latex; Levofloxacin; Nsaids; Oxycodone; Restasis [cyclosporine]; Sulfa antibiotics; and Biaxin [clarithromycin]  Chief Complaint  Patient presents with  . Medical Management of Chronic Issues    HPI: [name] who is being seen for routine issues of anemia, chronic kidney disease stage II, and constipation.  Past Medical History:  Diagnosis Date  . Anxiety   . Arterial tortuosity (aorta) 12/11/2012  . Asthma   . Chronic kidney disease, stage IV (severe) ([place]) 12/11/2012  . Chronic mental illness   . CKD (chronic kidney disease), stage III ([place]) 01/14/2015  . Closed hip fracture requiring operative repair, right, with routine healing, subsequent encounter 12/19/2015  . Colitis   . COPD (chronic obstructive pulmonary disease) ([place])   . Decreased rectal sphincter tone 11/21/2012  . Depression   . Drug-seeking behavior 02/26/2011   Use of multiple md to obtain benzos/narcotics pt must sign contract to receive controlled meds.   Marland Kitchen Dysphagia 01/11/2015  . GERD (gastroesophageal reflux disease)   . Glaucoma  06/26/2011  . Glaucoma of both eyes   . Hearing loss, sensorineural 03/04/2014   [facility] ENT; 02/26/14. There is not a medical or surgical treatment that would benefit her type of hearing loss. We discussed amplification in an overview fashion.    Marland Kitchen History of fall   . Insomnia, unspecified   . Macular degeneration 06/26/2011  . Migraines   . Nasal bone fracture 03/30/2014  . Osteoporosis   . Osteoporosis 02/26/2011  . Presence of right artificial hip joint   . Protein-calorie malnutrition, severe ([place]) 12/10/2012  . S/P ORIF (open reduction internal fixation) fracture 01/08/2016  . Schatzki's ring 01/14/2015  . Scoliosis   . Substance abuse ([place])    Benbzos and Narcotics, she does not get any of those medicines from [facility], we have empathetically told her that.  . Vertigo   . Vitamin B deficiency   . Vitamin D deficiency 08/25/2016  . Weight loss     Past Surgical History:  Procedure Laterality Date  . ABDOMINAL HYSTERECTOMY    . CATARACT EXTRACTION    . CHOLECYSTECTOMY    . ESOPHAGOGASTRODUODENOSCOPY (EGD) WITH PROPOFOL N/A 01/13/2015   Procedure: ESOPHAGOGASTRODUODENOSCOPY (EGD) WITH PROPOFOL;  Surgeon: [name], MD;  Location: [facility];  Service: Endoscopy;  Laterality: N/A;  . HIP ARTHROPLASTY Right 12/21/2015   Procedure: ARTHROPLASTY BIPOLAR HIP (HEMIARTHROPLASTY);  Surgeon: [name], MD;  Location: [place];  Service: Orthopedics;  Laterality: Right;  . PARTIAL HYSTERECTOMY      Allergies as of 05/02/2017      Reactions   Amoxicillin    Per daughter   Ampicillin    Per daughter   Depakote [valproic Acid]    DOES NOT WANT HER TO TAKE IT==Makes her hair fall out= per daughter who  is poa   Macrobid [nitrofurantoin Macrocrystal] Rash   Per MAR   Penicillins    Per daughter   Azithromycin Other (See Comments)   Per MAR   Doxycycline Other (See Comments)   Per MAR   Escitalopram Oxalate Other (See Comments)   Per MAR   Fioricet  [butalbital-apap-caffeine] Other (See Comments)   Drunk. Per MAR   Latex Other (See Comments)   Per MAR   Levofloxacin Other (See Comments)   Per MAR   Nsaids Other (See Comments)   This is not an allergy. The patient was told by Dr. Rockne Menghini to avoid NSAIDs and stop Vicoprofen in to 2014 to avoid nephrotoxicity. Per MAR.   Oxycodone Other (See Comments)   reaction to synthetic codeine Per MAR   Restasis [cyclosporine] Other (See Comments)   Per MAR   Sulfa Antibiotics Other (See Comments)   Per MAR   Biaxin [clarithromycin] Rash   With burning sensation Per [facility]      Medication List        Accurate as of 05/02/17 11:59 PM. Always use your most recent med list.          acetaminophen 325 MG tablet Commonly known as:  TYLENOL Take 650 mg by mouth every 6 (six) hours as needed for headache.   albuterol 108 (90 Base) MCG/ACT inhaler Commonly known as:  PROVENTIL HFA;VENTOLIN HFA Inhale 1 puff into the lungs every 6 (six) hours as needed for shortness of breath.   Biotin 5000 MCG Caps Take 1 capsule by mouth daily.   clonazePAM 0.5 MG tablet Commonly known as:  KLONOPIN Take 0.25 mg by mouth 2 (two) times daily.   Cranberry 425 MG Caps Take 425 mg by mouth 2 (two) times daily.   docusate sodium 100 MG capsule Commonly known as:  COLACE Take 100 mg by mouth 2 (two) times daily.   dorzolamide-timolol 22.3-6.8 MG/ML ophthalmic solution Commonly known as:  COSOPT Place 1 drop into both eyes 2 (two) times daily.   ENSURE Take 237 mLs by mouth 4 (four) times daily. D/t weight loss   ferrous sulfate 325 (65 FE) MG tablet Take 325 mg by mouth daily.   fludrocortisone 0.1 MG tablet Commonly known as:  FLORINEF Take 0.1 mg by mouth every morning. for orthostatic hypotension   HYDROcodone-acetaminophen 5-325 MG tablet Commonly known as:  NORCO/VICODIN Take 1 tablet by mouth 3 (three) times daily. At 9 am , 1 pm , and 9 pm for pain   lactulose 10 GM/15ML  solution Commonly known as:  CHRONULAC Take 15 mLs (10 g total) by mouth daily as needed for severe constipation.   lidocaine 5 % Commonly known as:  LIDODERM Place 1 patch onto the skin every 12 (twelve) hours as needed (for pain). Remove & Discard patch within 12 hours or as directed by MD   magnesium hydroxide 400 MG/5ML suspension Commonly known as:  MILK OF MAGNESIA Take 30 mLs by mouth daily as needed for mild constipation.   mirtazapine 15 MG tablet Commonly known as:  REMERON Take 7.5 mg by mouth at bedtime.   nitroGLYCERIN 0.4 MG SL tablet Commonly known as:  NITROSTAT Place 0.4 mg under the tongue every 5 (five) minutes as needed for chest pain. May use 3 times   polyethylene glycol packet Commonly known as:  MIRALAX / GLYCOLAX Take 17 g by mouth daily.   QUEtiapine 300 MG tablet Commonly known as:  SEROQUEL Take 300 mg by mouth at bedtime.  Give with a 50 mg tablet to = 350 mg   SEROQUEL XR 50 MG Tb24 24 hr tablet Generic drug:  QUEtiapine Take 50 mg by mouth at bedtime. Give with a 300 mg tablet to = 350 mg   senna 8.6 MG tablet Commonly known as:  SENOKOT Take 2 tablets by mouth 2 (two) times daily as needed for constipation.   travoprost (benzalkonium) 0.004 % ophthalmic solution Commonly known as:  TRAVATAN Place 1 drop into both eyes at bedtime.   vitamin B-12 1000 MCG tablet Commonly known as:  CYANOCOBALAMIN Take 1,000 mcg by mouth daily.   Vitamin D 1000 units capsule Take 1,000 Units by mouth daily with breakfast.       No orders of the defined types were placed in this encounter.   Immunization History  Administered Date(s) Administered  . Influenza Split 11/13/2011  . Influenza-Unspecified 02/13/2008, 11/30/2013, 12/20/2016  . PPD Test 12/15/2015  . Pneumococcal-Unspecified 02/13/2008, 10/16/2016  . Td 02/12/2009    Social History   Tobacco Use  . Smoking status: Former Smoker    Packs/day: 0.50    Types: Cigarettes  . Smokeless  tobacco: Never Used  Substance Use Topics  . Alcohol use: No    Review of Systems  DATA OBTAINED: from patient-minimal; nursing-no acute concerns except a very thin GENERAL:  no fevers, fatigue, appetite changes SKIN: No itching, rash HEENT: No complaint RESPIRATORY: No cough, wheezing, SOB CARDIAC: No chest pain, palpitations, lower extremity edema  GI: No abdominal pain, No N/V/D or constipation, No heartburn or reflux  GU: No dysuria, frequency or urgency, or incontinence  MUSCULOSKELETAL: No unrelieved bone/joint pain NEUROLOGIC: No headache, dizziness  PSYCHIATRIC: No overt anxiety or sadness  Vitals:   05/02/17 1018  BP: 101/68  Pulse: 92  Resp: 20  Temp: 97.9 F (36.6 C)  SpO2: 95%   Body mass index is 17.81 kg/m. Physical Exam  GENERAL APPEARANCE: Alert, conversant, No acute distress  SKIN: No diaphoresis rash HEENT: Unremarkable RESPIRATORY: Breathing is even, unlabored. Lung sounds are clear   CARDIOVASCULAR: Heart RRR no murmurs, rubs or gallops. No peripheral edema  GASTROINTESTINAL: Abdomen is soft, non-tender, not distended w/ normal bowel sounds.  GENITOURINARY: Bladder non tender, not distended  MUSCULOSKELETAL: No abnormal joints or musculature except a very thin as before NEUROLOGIC: Cranial nerves 2-12 grossly intact. Moves all extremities   PSYCHIATRIC: Mood and affect with dementia, no behavioral issues  Patient Active Problem List   Diagnosis Date Noted  . Abnormal EKG 01/24/2017  . Delusions ([place])   . Paranoia ([place])   . Vitamin D deficiency 08/25/2016  . Dementia without behavioral disturbance 03/25/2016  . S/P ORIF (open reduction internal fixation) fracture 01/08/2016  . Oral thrush 01/08/2016  . HTN (hypertension) 01/08/2016  . Closed hip fracture requiring operative repair, right, with routine healing, subsequent encounter 12/19/2015  . Bipolar disease, chronic ([place]) 12/17/2015  . Decubitus ulcer of hip, stage 3 ([place]) 12/11/2015  .  Legally blind 08/16/2015  . Ankle pain   . Encephalopathy   . Malnutrition of moderate degree 01/29/2015  . Altered mental status 01/26/2015  . Nausea and vomiting 01/17/2015  . Esophageal dysmotilities   . Urinary tract infectious disease   . Weakness 01/14/2015  . Schatzki's ring 01/14/2015  . CKD (chronic kidney disease), stage III ([place]) 01/14/2015  . Esophagus disorder   . Dysphagia 01/11/2015  . UTI (lower urinary tract infection) 03/30/2014  . Hearing loss, sensorineural 03/04/2014  . Nasal lesion 03/04/2014  .  Constipation 12/12/2012  . Hypokalemia 12/11/2012  . Orthostatic hypotension 12/11/2012  . Arterial tortuosity (aorta) 12/11/2012  . Protein-calorie malnutrition, severe ([place]) 12/10/2012  . Decreased rectal sphincter tone 11/21/2012  . Primary open angle glaucoma of both eyes, severe stage 11/11/2012  . Idiopathic scoliosis 07/08/2012  . Macular atrophy, retinal 06/26/2011  . Vision disturbance 06/26/2011  . Glaucoma 06/26/2011  . Nonexudative senile macular degeneration of retina 06/21/2011  . Macula scar of posterior pole 06/21/2011  . Status post intraocular lens implant 06/21/2011  . Anemia of chronic disease 04/17/2011  . Drug-seeking behavior 02/26/2011  . Osteoporosis 02/26/2011  . Depression   . Anxiety   . COPD (chronic obstructive pulmonary disease) ([place])   . GERD (gastroesophageal reflux disease)   . Migraines   . Insomnia, unspecified     CMP     Component Value Date/Time   NA 142 12/12/2016 1428   NA 145 06/11/2016   K 3.6 12/12/2016 1428   CL 109 12/12/2016 1428   CO2 22 12/12/2016 1428   GLUCOSE 90 12/12/2016 1428   BUN 14 12/12/2016 1428   BUN 20 06/11/2016   CREATININE 0.67 12/12/2016 1428   CREATININE 0.85 11/09/2013 1135   CALCIUM 9.3 12/12/2016 1428   PROT 6.5 12/12/2016 1428   ALBUMIN 3.4 (L) 12/12/2016 1428   AST 20 12/12/2016 1428   ALT 16 12/12/2016 1428   ALKPHOS 85 12/12/2016 1428   BILITOT 0.7 12/12/2016 1428    GFRNONAA >60 12/12/2016 1428   GFRNONAA 64 11/09/2013 1135   GFRAA >60 12/12/2016 1428   GFRAA 74 11/09/2013 1135   Recent Labs    06/11/16 09/03/16 2005 12/12/16 1428  NA 145 139 142  K 4.6 3.9 3.6  CL  --  105 109  CO2  --  25 22  GLUCOSE  --  117* 90  BUN 20 22* 14  CREATININE 0.6 0.70 0.67  CALCIUM  --  9.3 9.3   Recent Labs    06/11/16 09/03/16 2005 12/12/16 1428  AST 16 22 20   ALT 9 12* 16  ALKPHOS 89 106 85  BILITOT  --  0.3 0.7  PROT  --  7.0 6.5  ALBUMIN  --  3.7 3.4*   Recent Labs    05/22/16 09/03/16 2005 12/12/16 1428 03/19/17  WBC 9.6 8.0 7.8 7.8  NEUTROABS  --  4.5 4.6  --   HGB 12.0 11.3* 12.8 15.6  HCT 40 35.6* 39.9 48.6  MCV  --  77.6* 80.4  --   PLT 235 320 239  --    Recent Labs    06/11/16  CHOL 190  LDLCALC 116  TRIG 101   No results found for: [facility] Lab Results  Component Value Date   TSH 2.61 06/11/2016   Lab Results  Component Value Date   HGBA1C 5.4 06/11/2016   Lab Results  Component Value Date   CHOL 190 06/11/2016   HDL 54 06/11/2016   LDLCALC 116 06/11/2016   TRIG 101 06/11/2016    Significant Diagnostic Results in last 30 days:  No results found.  Assessment and Plan  Anemia of chronic disease Last use of 6 is most recent hemoglobin is 15.6 which is much improved from prior; continue vitamin B12 1000 mcg daily and iron 325 mg daily 160  CKD (chronic kidney disease), stage III  most recent GFR greater than 60, which is excellent; continue to monitor intervals  Constipation ; No complaints of problem; continue Chronulac 10 g  daily Colace 100 mg twice daily and Senokot 2 p.o. twice daily with as needed Bishop Limbo D. [name], MD

## 2017-05-05 ENCOUNTER — Encounter: Payer: Self-pay | Admitting: Internal Medicine

## 2017-05-05 NOTE — Assessment & Plan Note (Signed)
;   No complaints of problem; continue Chronulac 10 g daily Colace 100 mg twice daily and Senokot 2 p.o. twice daily with as needed MiraLAX

## 2017-05-05 NOTE — Assessment & Plan Note (Signed)
most recent GFR greater than 60, which is excellent; continue to monitor intervals

## 2017-05-05 NOTE — Assessment & Plan Note (Signed)
Last use of 6 is most recent hemoglobin is 15.6 which is much improved from prior; continue vitamin B12 1000 mcg daily and iron 325 mg daily 160

## 2017-05-08 ENCOUNTER — Other Ambulatory Visit: Payer: Self-pay | Admitting: Internal Medicine

## 2017-05-08 DIAGNOSIS — J449 Chronic obstructive pulmonary disease, unspecified: Secondary | ICD-10-CM

## 2017-05-14 ENCOUNTER — Ambulatory Visit (HOSPITAL_COMMUNITY)
Admission: RE | Admit: 2017-05-14 | Discharge: 2017-05-14 | Disposition: A | Discharge status: Home / Self Care | Payer: Medicare Other | Source: Ambulatory Visit | Attending: Internal Medicine | Admitting: Internal Medicine

## 2017-05-14 ENCOUNTER — Encounter (HOSPITAL_COMMUNITY): Payer: Self-pay

## 2017-05-14 DIAGNOSIS — R918 Other nonspecific abnormal finding of lung field: Secondary | ICD-10-CM | POA: Insufficient documentation

## 2017-05-14 DIAGNOSIS — J439 Emphysema, unspecified: Secondary | ICD-10-CM | POA: Insufficient documentation

## 2017-05-14 DIAGNOSIS — I700 Atherosclerosis of aorta: Secondary | ICD-10-CM | POA: Diagnosis not present

## 2017-05-14 DIAGNOSIS — J449 Chronic obstructive pulmonary disease, unspecified: Secondary | ICD-10-CM

## 2017-05-30 DIAGNOSIS — R441 Visual hallucinations: Secondary | ICD-10-CM | POA: Diagnosis not present

## 2017-05-30 DIAGNOSIS — F39 Unspecified mood [affective] disorder: Secondary | ICD-10-CM | POA: Diagnosis not present

## 2017-05-30 DIAGNOSIS — F0390 Unspecified dementia without behavioral disturbance: Secondary | ICD-10-CM | POA: Diagnosis not present

## 2017-05-30 DIAGNOSIS — R440 Auditory hallucinations: Secondary | ICD-10-CM | POA: Diagnosis not present

## 2017-06-07 ENCOUNTER — Non-Acute Institutional Stay (SKILLED_NURSING_FACILITY): Payer: Medicare Other | Admitting: Internal Medicine

## 2017-06-07 ENCOUNTER — Encounter: Payer: Self-pay | Admitting: Internal Medicine

## 2017-06-07 DIAGNOSIS — H353190 Nonexudative age-related macular degeneration, unspecified eye, stage unspecified: Secondary | ICD-10-CM | POA: Diagnosis not present

## 2017-06-07 DIAGNOSIS — H401133 Primary open-angle glaucoma, bilateral, severe stage: Secondary | ICD-10-CM

## 2017-06-07 DIAGNOSIS — R627 Adult failure to thrive: Secondary | ICD-10-CM

## 2017-06-07 NOTE — Progress Notes (Signed)
Location:  [facility] Room Number: 304-D Place of Service:  SNF (31)  Taylor Duos, MD  Patient Care Team: Taylor Duos, MD as PCP - General (Internal Medicine) [name], MD (Gastroenterology) [name], NP (Obstetrics and Gynecology)  Extended Emergency Contact Information Primary Emergency Contact: Taylor Roth,Taylor Roth Address: [address], [place] Phone: [phone] Mobile Phone: [phone] Relation: Daughter Secondary Emergency Contact: Taylor Roth [place] of [place] Phone: [phone] Mobile Phone: [phone] Relation: Daughter    Allergies: Amoxicillin; Ampicillin; Depakote [valproic acid]; Macrobid [nitrofurantoin macrocrystal]; Penicillins; Azithromycin; Doxycycline; Escitalopram oxalate; Fioricet [butalbital-apap-caffeine]; Latex; Levofloxacin; Nsaids; Oxycodone; Restasis [cyclosporine]; Sulfa antibiotics; and Biaxin [clarithromycin]  Chief Complaint  Patient presents with  . Medical Management of Chronic Issues    HPI: [name] who is being seen for routine issues of glaucoma, macular degeneration, and failure to thrive.  Past Medical History:  Diagnosis Date  . Anxiety   . Arterial tortuosity (aorta) 12/11/2012  . Asthma   . Chronic kidney disease, stage IV (severe) ([place]) 12/11/2012  . Chronic mental illness   . CKD (chronic kidney disease), stage III ([place]) 01/14/2015  . Closed hip fracture requiring operative repair, right, with routine healing, subsequent encounter 12/19/2015  . Colitis   . COPD (chronic obstructive pulmonary disease) ([place])   . Decreased rectal sphincter tone 11/21/2012  . Depression   . Drug-seeking behavior 02/26/2011   Use of multiple md to obtain benzos/narcotics pt must sign contract to receive controlled meds.   Marland Kitchen Dysphagia 01/11/2015  . GERD (gastroesophageal reflux disease)   . Glaucoma  06/26/2011  . Glaucoma of both eyes   . Hearing loss, sensorineural 03/04/2014   [facility] ENT; 02/26/14. There is not a medical or surgical treatment that would benefit her type of hearing loss. We discussed amplification in an overview fashion.    Marland Kitchen History of fall   . Insomnia, unspecified   . Macular degeneration 06/26/2011  . Migraines   . Nasal bone fracture 03/30/2014  . Osteoporosis   . Osteoporosis 02/26/2011  . Presence of right artificial hip joint   . Protein-calorie malnutrition, severe ([place]) 12/10/2012  . S/P ORIF (open reduction internal fixation) fracture 01/08/2016  . Schatzki's ring 01/14/2015  . Scoliosis   . Substance abuse ([place])    Benbzos and Narcotics, she does not get any of those medicines from [facility], we have empathetically told her that.  . Vertigo   . Vitamin B deficiency   . Vitamin D deficiency 08/25/2016  . Weight loss     Past Surgical History:  Procedure Laterality Date  . ABDOMINAL HYSTERECTOMY    . CATARACT EXTRACTION    . CHOLECYSTECTOMY    . ESOPHAGOGASTRODUODENOSCOPY (EGD) WITH PROPOFOL N/A 01/13/2015   Procedure: ESOPHAGOGASTRODUODENOSCOPY (EGD) WITH PROPOFOL;  Surgeon: [name], MD;  Location: [facility];  Service: Endoscopy;  Laterality: N/A;  . HIP ARTHROPLASTY Right 12/21/2015   Procedure: ARTHROPLASTY BIPOLAR HIP (HEMIARTHROPLASTY);  Surgeon: [name], MD;  Location: [facility];  Service: Orthopedics;  Laterality: Right;  . PARTIAL HYSTERECTOMY      Allergies as of 06/07/2017      Reactions   Amoxicillin    Per daughter   Ampicillin    Per daughter   Depakote [valproic Acid]    DOES NOT WANT HER TO TAKE IT==Makes her hair fall out= per daughter who is  poa   Macrobid [nitrofurantoin Macrocrystal] Rash   Per MAR   Penicillins    Per daughter   Azithromycin Other (See Comments)   Per MAR   Doxycycline Other (See Comments)   Per MAR   Escitalopram Oxalate Other (See Comments)   Per MAR   Fioricet  [butalbital-apap-caffeine] Other (See Comments)   Drunk. Per MAR   Latex Other (See Comments)   Per MAR   Levofloxacin Other (See Comments)   Per MAR   Nsaids Other (See Comments)   This is not an allergy. The patient was told by Dr. Rockne Menghini to avoid NSAIDs and stop Vicoprofen in to 2014 to avoid nephrotoxicity. Per MAR.   Oxycodone Other (See Comments)   reaction to synthetic codeine Per MAR   Restasis [cyclosporine] Other (See Comments)   Per MAR   Sulfa Antibiotics Other (See Comments)   Per MAR   Biaxin [clarithromycin] Rash   With burning sensation Per [facility]      Medication List        Accurate as of 06/07/17 11:59 PM. Always use your most recent med list.          acetaminophen 325 MG tablet Commonly known as:  TYLENOL Take 650 mg by mouth every 6 (six) hours as needed for headache.   albuterol 108 (90 Base) MCG/ACT inhaler Commonly known as:  PROVENTIL HFA;VENTOLIN HFA Inhale 1 puff into the lungs every 6 (six) hours as needed for shortness of breath.   Biotin 5000 MCG Caps Take 1 capsule by mouth daily.   clonazePAM 0.5 MG tablet Commonly known as:  KLONOPIN Take 0.25 mg by mouth 2 (two) times daily.   Cranberry 425 MG Caps Take 425 mg by mouth 2 (two) times daily.   docusate sodium 100 MG capsule Commonly known as:  COLACE Take 100 mg by mouth 2 (two) times daily.   dorzolamide-timolol 22.3-6.8 MG/ML ophthalmic solution Commonly known as:  COSOPT Place 1 drop into both eyes 2 (two) times daily.   ENSURE Take 237 mLs by mouth 4 (four) times daily. D/t weight loss   ferrous sulfate 325 (65 FE) MG tablet Take 325 mg by mouth daily.   fludrocortisone 0.1 MG tablet Commonly known as:  FLORINEF Take 0.1 mg by mouth every morning. for orthostatic hypotension   HYDROcodone-acetaminophen 5-325 MG tablet Commonly known as:  NORCO/VICODIN Take 1 tablet by mouth 3 (three) times daily. At 9 am , 1 pm , and 9 pm for pain   lactulose 10 GM/15ML  solution Commonly known as:  CHRONULAC Take 15 mLs (10 g total) by mouth daily as needed for severe constipation.   magnesium hydroxide 400 MG/5ML suspension Commonly known as:  MILK OF MAGNESIA Take 30 mLs by mouth daily as needed for mild constipation.   mirtazapine 15 MG tablet Commonly known as:  REMERON Take 7.5 mg by mouth at bedtime.   nitroGLYCERIN 0.4 MG SL tablet Commonly known as:  NITROSTAT Place 0.4 mg under the tongue every 5 (five) minutes as needed for chest pain. May use 3 times   polyethylene glycol packet Commonly known as:  MIRALAX / GLYCOLAX Take 17 g by mouth daily.   QUEtiapine 300 MG tablet Commonly known as:  SEROQUEL Take 300 mg by mouth at bedtime. Give with a 50 mg tablet to = 350 mg   SEROQUEL XR 50 MG Tb24 24 hr tablet Generic drug:  QUEtiapine Take 50 mg by mouth at bedtime. Give with a 300 mg tablet  to = 350 mg   senna 8.6 MG tablet Commonly known as:  SENOKOT Take 2 tablets by mouth 2 (two) times daily as needed for constipation.   travoprost (benzalkonium) 0.004 % ophthalmic solution Commonly known as:  TRAVATAN Place 1 drop into both eyes at bedtime.   vitamin B-12 1000 MCG tablet Commonly known as:  CYANOCOBALAMIN Take 1,000 mcg by mouth daily.   Vitamin D 1000 units capsule Take 1,000 Units by mouth daily with breakfast.       No orders of the defined types were placed in this encounter.   Immunization History  Administered Date(s) Administered  . Influenza Split 11/13/2011  . Influenza-Unspecified 02/13/2008, 11/30/2013, 12/20/2016  . PPD Test 12/15/2015  . Pneumococcal-Unspecified 02/13/2008, 10/16/2016  . Td 02/12/2009    Social History   Tobacco Use  . Smoking status: Former Smoker    Packs/day: 0.50    Types: Cigarettes  . Smokeless tobacco: Never Used  Substance Use Topics  . Alcohol use: No    Review of Systems  DATA OBTAINED: from patient-extremely limited; nursing-no new concerns GENERAL:  no fevers,  fatigue,+ appetite changes SKIN: No itching, rash HEENT: No complaint RESPIRATORY: No cough, wheezing, SOB CARDIAC: No chest pain, palpitations, lower extremity edema  GI: No abdominal pain, No N/V/D or constipation, No heartburn or reflux  GU: No dysuria, frequency or urgency, or incontinence  MUSCULOSKELETAL: No unrelieved bone/joint pain NEUROLOGIC: No headache, dizziness  PSYCHIATRIC: No overt anxiety or sadness  Vitals:   06/07/17 1505  BP: 100/65  Pulse: 62  Resp: 16  Temp: (!) 97 F (36.1 C)  SpO2: 96%   Body mass index is 17.13 kg/m. Physical Exam  GENERAL APPEARANCE: Alert, normally conversant, No acute distress  SKIN: No diaphoresis rash HEENT: Unremarkable RESPIRATORY: Breathing is even, unlabored. Lung sounds are clear   CARDIOVASCULAR: Heart RRR no murmurs, rubs or gallops. No peripheral edema  GASTROINTESTINAL: Abdomen is soft, non-tender, not distended w/ normal bowel sounds.  GENITOURINARY: Bladder non tender, not distended  MUSCULOSKELETAL: No abnormal joints or musculature NEUROLOGIC: Cranial nerves 2-12 grossly intact. Moves all extremities PSYCHIATRIC: Mood and affect dementia, no behavioral issues  Patient Active Problem List   Diagnosis Date Noted  . Abnormal EKG 01/24/2017  . Delusions ([place])   . Paranoia ([place])   . Vitamin D deficiency 08/25/2016  . Dementia without behavioral disturbance 03/25/2016  . S/P ORIF (open reduction internal fixation) fracture 01/08/2016  . Oral thrush 01/08/2016  . HTN (hypertension) 01/08/2016  . Closed hip fracture requiring operative repair, right, with routine healing, subsequent encounter 12/19/2015  . Bipolar disease, chronic ([place]) 12/17/2015  . Decubitus ulcer of hip, stage 3 ([place]) 12/11/2015  . Legally blind 08/16/2015  . Ankle pain   . Encephalopathy   . Malnutrition of moderate degree 01/29/2015  . Altered mental status 01/26/2015  . Nausea and vomiting 01/17/2015  . Esophageal dysmotilities   .  Urinary tract infectious disease   . Weakness 01/14/2015  . Schatzki's ring 01/14/2015  . CKD (chronic kidney disease), stage III ([place]) 01/14/2015  . Esophagus disorder   . Dysphagia 01/11/2015  . UTI (lower urinary tract infection) 03/30/2014  . Hearing loss, sensorineural 03/04/2014  . Nasal lesion 03/04/2014  . Constipation 12/12/2012  . Hypokalemia 12/11/2012  . Orthostatic hypotension 12/11/2012  . Arterial tortuosity (aorta) 12/11/2012  . Protein-calorie malnutrition, severe ([place]) 12/10/2012  . Adult failure to thrive 12/09/2012  . Decreased rectal sphincter tone 11/21/2012  . Primary open angle glaucoma of  both eyes, severe stage 11/11/2012  . Idiopathic scoliosis 07/08/2012  . Macular atrophy, retinal 06/26/2011  . Vision disturbance 06/26/2011  . Glaucoma 06/26/2011  . Nonexudative senile macular degeneration of retina 06/21/2011  . Macula scar of posterior pole 06/21/2011  . Status post intraocular lens implant 06/21/2011  . Anemia of chronic disease 04/17/2011  . Drug-seeking behavior 02/26/2011  . Osteoporosis 02/26/2011  . Depression   . Anxiety   . COPD (chronic obstructive pulmonary disease) ([place])   . GERD (gastroesophageal reflux disease)   . Migraines   . Insomnia, unspecified     CMP     Component Value Date/Time   NA 142 12/12/2016 1428   NA 145 06/11/2016   K 3.6 12/12/2016 1428   CL 109 12/12/2016 1428   CO2 22 12/12/2016 1428   GLUCOSE 90 12/12/2016 1428   BUN 14 12/12/2016 1428   BUN 20 06/11/2016   CREATININE 0.67 12/12/2016 1428   CREATININE 0.85 11/09/2013 1135   CALCIUM 9.3 12/12/2016 1428   PROT 6.5 12/12/2016 1428   ALBUMIN 3.4 (L) 12/12/2016 1428   AST 20 12/12/2016 1428   ALT 16 12/12/2016 1428   ALKPHOS 85 12/12/2016 1428   BILITOT 0.7 12/12/2016 1428   GFRNONAA >60 12/12/2016 1428   GFRNONAA 64 11/09/2013 1135   GFRAA >60 12/12/2016 1428   GFRAA 74 11/09/2013 1135   Recent Labs    09/03/16 2005 12/12/16 1428  NA 139 142    K 3.9 3.6  CL 105 109  CO2 25 22  GLUCOSE 117* 90  BUN 22* 14  CREATININE 0.70 0.67  CALCIUM 9.3 9.3   Recent Labs    09/03/16 2005 12/12/16 1428  AST 22 20  ALT 12* 16  ALKPHOS 106 85  BILITOT 0.3 0.7  PROT 7.0 6.5  ALBUMIN 3.7 3.4*   Recent Labs    09/03/16 2005 12/12/16 1428 03/19/17  WBC 8.0 7.8 7.8  NEUTROABS 4.5 4.6  --   HGB 11.3* 12.8 15.6  HCT 35.6* 39.9 48.6  MCV 77.6* 80.4  --   PLT 320 239  --    No results for input(s): CHOL, LDLCALC, TRIG in the last 8760 hours.  Invalid input(s): HCL No results found for: [facility] Lab Results  Component Value Date   TSH 2.61 06/11/2016   Lab Results  Component Value Date   HGBA1C 5.4 06/11/2016   Lab Results  Component Value Date   CHOL 190 06/11/2016   HDL 54 06/11/2016   LDLCALC 116 06/11/2016   TRIG 101 06/11/2016    Significant Diagnostic Results in last 30 days:  No results found.  Assessment and Plan  Failure to thrive- patient continues to slowly decline; patient is intermittently not eating and not getting out of bed and hallucinating; patient's daughter is not accepting; will continue supportive care  Glaucoma- chronic and stable; continue Cosopt Hivid and Travatan 1 drop each eye at bedtime  Nonexudative macular degeneration-chronic; patient is legally blind from same so is not on any medications    Taylor Delaine. [name], MD

## 2017-06-20 DIAGNOSIS — J449 Chronic obstructive pulmonary disease, unspecified: Secondary | ICD-10-CM | POA: Diagnosis not present

## 2017-06-20 DIAGNOSIS — Z9181 History of falling: Secondary | ICD-10-CM | POA: Diagnosis not present

## 2017-07-04 ENCOUNTER — Non-Acute Institutional Stay (SKILLED_NURSING_FACILITY): Payer: Medicare Other | Admitting: Internal Medicine

## 2017-07-04 ENCOUNTER — Encounter: Payer: Self-pay | Admitting: Internal Medicine

## 2017-07-04 DIAGNOSIS — F22 Delusional disorders: Secondary | ICD-10-CM | POA: Diagnosis not present

## 2017-07-04 DIAGNOSIS — I951 Orthostatic hypotension: Secondary | ICD-10-CM | POA: Diagnosis not present

## 2017-07-04 DIAGNOSIS — E559 Vitamin D deficiency, unspecified: Secondary | ICD-10-CM

## 2017-07-04 NOTE — Progress Notes (Signed)
Location:  [facility] Room Number: 304-D Place of Service:  SNF (31)  [name], MD  Patient Care Team: [name], MD as PCP - General (Internal Medicine) [name], MD (Gastroenterology) [name], NP (Obstetrics and Gynecology)  Extended Emergency Contact Information Primary Emergency Contact: [name],[name] Address: [place], [place] Phone: [phone] Mobile Phone: [phone] Relation: Daughter Secondary Emergency Contact: [name] Phone: [phone] Mobile Phone: [phone] Relation: Daughter    Allergies: Amoxicillin; Ampicillin; Depakote [valproic acid]; Macrobid [nitrofurantoin macrocrystal]; Penicillins; Azithromycin; Doxycycline; Escitalopram oxalate; Fioricet [butalbital-apap-caffeine]; Latex; Levofloxacin; Nsaids; Oxycodone; Restasis [cyclosporine]; Sulfa antibiotics; and Biaxin [clarithromycin]  Chief Complaint  Patient presents with  . Medical Management of Chronic Issues    Routine Visit    HPI: [name] who is being seen for routine issues of vitamin D deficiency, paranoia, and hypertension.  Past Medical History:  Diagnosis Date  . Anxiety   . Arterial tortuosity (aorta) 12/11/2012  . Asthma   . Chronic kidney disease, stage IV (severe) ([place]) 12/11/2012  . Chronic mental illness   . CKD (chronic kidney disease), stage III ([place]) 01/14/2015  . Closed hip fracture requiring operative repair, right, with routine healing, subsequent encounter 12/19/2015  . Colitis   . COPD (chronic obstructive pulmonary disease) ([place])   . Decreased rectal sphincter tone 11/21/2012  . Depression   . Drug-seeking behavior 02/26/2011   Use of multiple md to obtain benzos/narcotics pt must sign contract to receive controlled meds.   Marland Kitchen Dysphagia 01/11/2015  . GERD (gastroesophageal reflux disease)     . Glaucoma 06/26/2011  . Glaucoma of both eyes   . Hearing loss, sensorineural 03/04/2014   [facility]; 02/26/14. There is not a medical or surgical treatment that would benefit her type of hearing loss. We discussed amplification in an overview fashion.    Marland Kitchen History of fall   . Insomnia, unspecified   . Macular degeneration 06/26/2011  . Migraines   . Nasal bone fracture 03/30/2014  . Osteoporosis   . Osteoporosis 02/26/2011  . Presence of right artificial hip joint   . Protein-calorie malnutrition, severe ([place]) 12/10/2012  . S/P ORIF (open reduction internal fixation) fracture 01/08/2016  . Schatzki's ring 01/14/2015  . Scoliosis   . Substance abuse ([place])    Benbzos and Narcotics, she does not get any of those medicines from [facility], we have empathetically told her that.  . Vertigo   . Vitamin B deficiency   . Vitamin D deficiency 08/25/2016  . Weight loss     Past Surgical History:  Procedure Laterality Date  . ABDOMINAL HYSTERECTOMY    . CATARACT EXTRACTION    . CHOLECYSTECTOMY    . ESOPHAGOGASTRODUODENOSCOPY (EGD) WITH PROPOFOL N/A 01/13/2015   Procedure: ESOPHAGOGASTRODUODENOSCOPY (EGD) WITH PROPOFOL;  Surgeon: [name], MD;  Location: [facility];  Service: Endoscopy;  Laterality: N/A;  . HIP ARTHROPLASTY Right 12/21/2015   Procedure: ARTHROPLASTY BIPOLAR HIP (HEMIARTHROPLASTY);  Surgeon: [name], MD;  Location: [facility];  Service: Orthopedics;  Laterality: Right;  . PARTIAL HYSTERECTOMY      Allergies as of 07/04/2017      Reactions   Amoxicillin    Per daughter   Ampicillin    Per daughter   Depakote [valproic Acid]    DOES NOT WANT HER TO TAKE IT==Makes her  hair fall out= per daughter who is poa   Macrobid [nitrofurantoin Macrocrystal] Rash   Per MAR   Penicillins    Per daughter   Azithromycin Other (See Comments)   Per MAR   Doxycycline Other (See Comments)   Per MAR   Escitalopram Oxalate Other (See Comments)   Per MAR   Fioricet  [butalbital-apap-caffeine] Other (See Comments)   Drunk. Per MAR   Latex Other (See Comments)   Per MAR   Levofloxacin Other (See Comments)   Per MAR   Nsaids Other (See Comments)   This is not an allergy. The patient was told by Dr. Rockne Menghini to avoid NSAIDs and stop Vicoprofen in to 2014 to avoid nephrotoxicity. Per MAR.   Oxycodone Other (See Comments)   reaction to synthetic codeine Per MAR   Restasis [cyclosporine] Other (See Comments)   Per MAR   Sulfa Antibiotics Other (See Comments)   Per MAR   Biaxin [clarithromycin] Rash   With burning sensation Per [facility]      Medication List        Accurate as of 07/04/17 11:59 PM. Always use your most recent med list.          acetaminophen 325 MG tablet Commonly known as:  TYLENOL Take 650 mg by mouth every 6 (six) hours as needed for headache.   albuterol 108 (90 Base) MCG/ACT inhaler Commonly known as:  PROVENTIL HFA;VENTOLIN HFA Inhale 1 puff into the lungs every 6 (six) hours as needed for shortness of breath.   Biotin 5000 MCG Caps Take 1 capsule by mouth daily.   clonazePAM 0.5 MG tablet Commonly known as:  KLONOPIN Take 0.25 mg by mouth 2 (two) times daily.   Cranberry 425 MG Caps Take 425 mg by mouth 2 (two) times daily.   docusate sodium 100 MG capsule Commonly known as:  COLACE Take 100 mg by mouth 2 (two) times daily.   dorzolamide-timolol 22.3-6.8 MG/ML ophthalmic solution Commonly known as:  COSOPT Place 1 drop into both eyes 2 (two) times daily.   ENSURE Take 237 mLs by mouth 4 (four) times daily. D/t weight loss   ferrous sulfate 325 (65 FE) MG tablet Take 325 mg by mouth daily.   fludrocortisone 0.1 MG tablet Commonly known as:  FLORINEF Take 0.1 mg by mouth every morning. for orthostatic hypotension   HYDROcodone-acetaminophen 5-325 MG tablet Commonly known as:  NORCO/VICODIN Take 1 tablet by mouth 3 (three) times daily. At 9 am , 1 pm , and 9 pm for pain   lactulose 10 GM/15ML  solution Commonly known as:  CHRONULAC Take 15 mLs (10 g total) by mouth daily as needed for severe constipation.   magnesium hydroxide 400 MG/5ML suspension Commonly known as:  MILK OF MAGNESIA Take 30 mLs by mouth daily as needed for mild constipation.   mirtazapine 15 MG tablet Commonly known as:  REMERON Take 7.5 mg by mouth at bedtime.   nitroGLYCERIN 0.4 MG SL tablet Commonly known as:  NITROSTAT Place 0.4 mg under the tongue every 5 (five) minutes as needed for chest pain. May use 3 times   polyethylene glycol packet Commonly known as:  MIRALAX / GLYCOLAX Take 17 g by mouth daily.   QUEtiapine 300 MG tablet Commonly known as:  SEROQUEL Take 300 mg by mouth at bedtime. Give with a 50 mg tablet to = 350 mg   SEROQUEL XR 50 MG Tb24 24 hr tablet Generic drug:  QUEtiapine Take 50 mg by mouth at  bedtime. Give with a 300 mg tablet to = 350 mg   senna 8.6 MG tablet Commonly known as:  SENOKOT Take 2 tablets by mouth 2 (two) times daily as needed for constipation.   travoprost (benzalkonium) 0.004 % ophthalmic solution Commonly known as:  TRAVATAN Place 1 drop into both eyes at bedtime.   vitamin B-12 1000 MCG tablet Commonly known as:  CYANOCOBALAMIN Take 1,000 mcg by mouth daily.   Vitamin D 1000 units capsule Take 1,000 Units by mouth daily with breakfast.       No orders of the defined types were placed in this encounter.   Immunization History  Administered Date(s) Administered  . Influenza Split 11/13/2011  . Influenza-Unspecified 02/13/2008, 11/30/2013, 12/20/2016  . PPD Test 12/15/2015  . Pneumococcal-Unspecified 02/13/2008, 10/16/2016  . Td 02/12/2009    Social History   Tobacco Use  . Smoking status: Former Smoker    Packs/day: 0.50    Types: Cigarettes  . Smokeless tobacco: Never Used  Substance Use Topics  . Alcohol use: No    Review of Systems  DATA OBTAINED: from patient-limited; nursing-no acute concerns GENERAL:  no fevers,  fatigue, appetite changes SKIN: No itching, rash HEENT: No complaint RESPIRATORY: No cough, wheezing, SOB CARDIAC: No chest pain, palpitations, lower extremity edema  GI: No abdominal pain, No N/V/D or constipation, No heartburn or reflux  GU: No dysuria, frequency or urgency, or incontinence  MUSCULOSKELETAL: No unrelieved bone/joint pain NEUROLOGIC: No headache, dizziness  PSYCHIATRIC: No overt anxiety or sadness  Vitals:   07/04/17 1013  BP: 90/66  Pulse: 80  Resp: 18  Temp: 97.6 F (36.4 C)  SpO2: 97%   Body mass index is 16.76 kg/m. Physical Exam  GENERAL APPEARANCE: Alert, moderately conversant, No acute distress  SKIN: No diaphoresis rash HEENT: Unremarkable RESPIRATORY: Breathing is even, unlabored. Lung sounds are clear   CARDIOVASCULAR: Heart RRR no murmurs, rubs or gallops. No peripheral edema  GASTROINTESTINAL: Abdomen is soft, non-tender, not distended w/ normal bowel sounds.  GENITOURINARY: Bladder non tender, not distended  MUSCULOSKELETAL: No abnormal joints or musculature but tiny NEUROLOGIC: Cranial nerves 2-12 grossly intact. Moves all extremities PSYCHIATRIC: Mood and affect appropriate to situation, no behavioral issues  Patient Active Problem List   Diagnosis Date Noted  . Abnormal EKG 01/24/2017  . Delusions ([place])   . Paranoia ([place])   . Vitamin D deficiency 08/25/2016  . Dementia without behavioral disturbance 03/25/2016  . S/P ORIF (open reduction internal fixation) fracture 01/08/2016  . Oral thrush 01/08/2016  . HTN (hypertension) 01/08/2016  . Closed hip fracture requiring operative repair, right, with routine healing, subsequent encounter 12/19/2015  . Bipolar disease, chronic ([place]) 12/17/2015  . Decubitus ulcer of hip, stage 3 ([place]) 12/11/2015  . Legally blind 08/16/2015  . Ankle pain   . Encephalopathy   . Malnutrition of moderate degree 01/29/2015  . Altered mental status 01/26/2015  . Nausea and vomiting 01/17/2015  . Esophageal  dysmotilities   . Urinary tract infectious disease   . Weakness 01/14/2015  . Schatzki's ring 01/14/2015  . CKD (chronic kidney disease), stage III ([place]) 01/14/2015  . Esophagus disorder   . Dysphagia 01/11/2015  . UTI (lower urinary tract infection) 03/30/2014  . Hearing loss, sensorineural 03/04/2014  . Nasal lesion 03/04/2014  . Constipation 12/12/2012  . Hypokalemia 12/11/2012  . Orthostatic hypotension 12/11/2012  . Arterial tortuosity (aorta) 12/11/2012  . Protein-calorie malnutrition, severe ([place]) 12/10/2012  . Adult failure to thrive 12/09/2012  . Decreased rectal sphincter  tone 11/21/2012  . Primary open angle glaucoma of both eyes, severe stage 11/11/2012  . Idiopathic scoliosis 07/08/2012  . Macular atrophy, retinal 06/26/2011  . Vision disturbance 06/26/2011  . Glaucoma 06/26/2011  . Nonexudative senile macular degeneration of retina 06/21/2011  . Macula scar of posterior pole 06/21/2011  . Status post intraocular lens implant 06/21/2011  . Anemia of chronic disease 04/17/2011  . Drug-seeking behavior 02/26/2011  . Osteoporosis 02/26/2011  . Depression   . Anxiety   . COPD (chronic obstructive pulmonary disease) ([place])   . GERD (gastroesophageal reflux disease)   . Migraines   . Insomnia, unspecified     CMP     Component Value Date/Time   NA 140 07/31/2017   K 5.0 07/31/2017   CL 109 12/12/2016 1428   CO2 22 12/12/2016 1428   GLUCOSE 90 12/12/2016 1428   BUN 23 (A) 07/31/2017   CREATININE 0.62 07/31/2017   CALCIUM 8.9 07/31/2017   PROT 5.8 07/31/2017   ALBUMIN 3.5 07/31/2017   AST 14 07/31/2017   ALT 5 07/31/2017   ALKPHOS 87 07/31/2017   BILITOT 0.3 07/31/2017   GFRNONAA 81.51 07/31/2017   GFRNONAA 64 11/09/2013 1135   GFRAA >60 12/12/2016 1428   GFRAA 74 11/09/2013 1135   Recent Labs    09/03/16 2005 12/12/16 1428 02/22/17 07/31/17  NA 139 142 143 140  K 3.9 3.6 4.4 5.0  CL 105 109  --   --   CO2 25 22  --   --   GLUCOSE 117* 90  --   --    BUN 22* 14 17 23*  CREATININE 0.70 0.67 0.6 0.62  CALCIUM 9.3 9.3  --  8.9   Recent Labs    09/03/16 2005 12/12/16 1428 02/22/17 07/31/17  AST 22 20 16 14   ALT 12* 16 9 5   ALKPHOS 106 85 117 87  BILITOT 0.3 0.7  --  0.3  PROT 7.0 6.5  --  5.8  ALBUMIN 3.7 3.4*  --  3.5   Recent Labs    09/03/16 2005 12/12/16 1428 02/22/17 03/19/17 07/31/17  WBC 8.0 7.8 4.8 7.8 5.7  NEUTROABS 4.5 4.6  --   --   --   HGB 11.3* 12.8 13.2 15.6 12.4  HCT 35.6* 39.9 40 48.6 37.3  MCV 77.6* 80.4  --   --  87.2  PLT 320 239 218  --   --    Recent Labs    07/31/17  CHOL 134  LDLCALC 61  TRIG 102   No results found for: [facility] Lab Results  Component Value Date   TSH 0.59 07/31/2017   Lab Results  Component Value Date   HGBA1C 5.4 06/11/2016   Lab Results  Component Value Date   CHOL 134 07/31/2017   HDL 54 06/11/2016   LDLCALC 61 07/31/2017   TRIG 102 07/31/2017    Significant Diagnostic Results in last 30 days:  No results found.  Assessment and Plan  Vitamin D deficiency Most recent vitamin D levels 45; continue 1000 units daily to maintain his level  Paranoia (H[facility]) At times escalates; patient is now on Seroquel 350 mg nightly  Orthostatic hypotension Maintaining; continue Florinef 0.1 mg p.o. daily      Webb Silversmith D. [name], MD

## 2017-07-06 ENCOUNTER — Encounter: Payer: Self-pay | Admitting: Internal Medicine

## 2017-07-06 NOTE — Assessment & Plan Note (Signed)
Chronic; patient is legally blind from same

## 2017-07-06 NOTE — Assessment & Plan Note (Signed)
Chronic; continue Cosopt 22.3-6.8 1 drop twice daily and Travatan 04% 1 drop nightly

## 2017-07-06 NOTE — Assessment & Plan Note (Signed)
Patient continues to slowly decline; patient is in intermittently not eating, not getting out of bed and hallucinating; patient's daughter is not really excepting; will continue supportive care

## 2017-07-09 DIAGNOSIS — F411 Generalized anxiety disorder: Secondary | ICD-10-CM | POA: Diagnosis not present

## 2017-07-09 DIAGNOSIS — F39 Unspecified mood [affective] disorder: Secondary | ICD-10-CM | POA: Diagnosis not present

## 2017-07-09 DIAGNOSIS — F0390 Unspecified dementia without behavioral disturbance: Secondary | ICD-10-CM | POA: Diagnosis not present

## 2017-07-18 DIAGNOSIS — R1312 Dysphagia, oropharyngeal phase: Secondary | ICD-10-CM | POA: Diagnosis not present

## 2017-07-29 ENCOUNTER — Non-Acute Institutional Stay (SKILLED_NURSING_FACILITY): Payer: Medicare Other | Admitting: Internal Medicine

## 2017-07-29 DIAGNOSIS — K219 Gastro-esophageal reflux disease without esophagitis: Secondary | ICD-10-CM | POA: Diagnosis not present

## 2017-07-29 DIAGNOSIS — F419 Anxiety disorder, unspecified: Secondary | ICD-10-CM | POA: Diagnosis not present

## 2017-07-29 DIAGNOSIS — H409 Unspecified glaucoma: Secondary | ICD-10-CM | POA: Diagnosis not present

## 2017-07-30 ENCOUNTER — Encounter: Payer: Self-pay | Admitting: Internal Medicine

## 2017-07-30 ENCOUNTER — Non-Acute Institutional Stay (SKILLED_NURSING_FACILITY): Payer: Medicare Other | Admitting: Internal Medicine

## 2017-07-30 DIAGNOSIS — R627 Adult failure to thrive: Secondary | ICD-10-CM | POA: Diagnosis not present

## 2017-07-30 DIAGNOSIS — G301 Alzheimer's disease with late onset: Secondary | ICD-10-CM | POA: Diagnosis not present

## 2017-07-30 DIAGNOSIS — F0280 Dementia in other diseases classified elsewhere without behavioral disturbance: Secondary | ICD-10-CM

## 2017-07-30 DIAGNOSIS — Z7189 Other specified counseling: Secondary | ICD-10-CM | POA: Diagnosis not present

## 2017-07-30 NOTE — Progress Notes (Signed)
Location:  [facility] Room Number: 732K Place of Service:  [phone])  Taylor Duos, MD  Patient Care Team: Taylor Duos, MD as PCP - General (Internal Medicine) Taylor Ada, MD (Gastroenterology) Taylor Holter, NP (Obstetrics and Gynecology)  Extended Emergency Contact Information Primary Emergency Contact: Taylor,Roth Address: [place], [place] Phone: [phone] Mobile Phone: [phone] Relation: Daughter Secondary Emergency Contact: Taylor Roth [place] Phone: [phone] Mobile Phone: [phone] Relation: Daughter    Allergies: Amoxicillin; Ampicillin; Depakote [valproic acid]; Macrobid [nitrofurantoin macrocrystal]; Penicillins; Azithromycin; Doxycycline; Escitalopram oxalate; Fioricet [butalbital-apap-caffeine]; Latex; Levofloxacin; Nsaids; Oxycodone; Restasis [cyclosporine]; Sulfa antibiotics; and Biaxin [clarithromycin]  Chief Complaint  Patient presents with  . Medical Management of Chronic Issues    Routine Visit    HPI: [name] who is being seen for a family meeting with her daughter who is from out of town who is also her POA and her brother-in-law.  Patient has been failing to thrive and patient's daughter is just checking in on this.  Past Medical History:  Diagnosis Date  . Anxiety   . Arterial tortuosity (aorta) 12/11/2012  . Asthma   . Chronic kidney disease, stage IV (severe) ([place]) 12/11/2012  . Chronic mental illness   . CKD (chronic kidney disease), stage III ([place]) 01/14/2015  . Closed hip fracture requiring operative repair, right, with routine healing, subsequent encounter 12/19/2015  . Colitis   . COPD (chronic obstructive pulmonary disease) ([place])   . Decreased rectal sphincter tone 11/21/2012  . Depression   . Drug-seeking behavior 02/26/2011   Use of multiple md to obtain benzos/narcotics pt  must sign contract to receive controlled meds.   Marland Kitchen Dysphagia 01/11/2015  . GERD (gastroesophageal reflux disease)   . Glaucoma 06/26/2011  . Glaucoma of both eyes   . Hearing loss, sensorineural 03/04/2014   [facility] ENT; 02/26/14. There is not a medical or surgical treatment that would benefit her type of hearing loss. We discussed amplification in an overview fashion.    Marland Kitchen History of fall   . Insomnia, unspecified   . Macular degeneration 06/26/2011  . Migraines   . Nasal bone fracture 03/30/2014  . Osteoporosis   . Osteoporosis 02/26/2011  . Presence of right artificial hip joint   . Protein-calorie malnutrition, severe ([place]) 12/10/2012  . S/P ORIF (open reduction internal fixation) fracture 01/08/2016  . Schatzki's ring 01/14/2015  . Scoliosis   . Substance abuse ([place])    Benbzos and Narcotics, she does not get any of those medicines from [facility], we have empathetically told her that.  . Vertigo   . Vitamin B deficiency   . Vitamin D deficiency 08/25/2016  . Weight loss     Past Surgical History:  Procedure Laterality Date  . ABDOMINAL HYSTERECTOMY    . CATARACT EXTRACTION    . CHOLECYSTECTOMY    . ESOPHAGOGASTRODUODENOSCOPY (EGD) WITH PROPOFOL N/A 01/13/2015   Procedure: ESOPHAGOGASTRODUODENOSCOPY (EGD) WITH PROPOFOL;  Surgeon: [name], MD;  Location: [facility];  Service: Endoscopy;  Laterality: N/A;  . HIP ARTHROPLASTY Right 12/21/2015   Procedure: ARTHROPLASTY BIPOLAR HIP (HEMIARTHROPLASTY);  Surgeon: [name], MD;  Location: [facility];  Service: Orthopedics;  Laterality: Right;  . PARTIAL HYSTERECTOMY      Allergies as of 07/30/2017      Reactions   Amoxicillin    Per daughter  Ampicillin    Per daughter   Depakote [valproic Acid]    DOES NOT WANT HER TO TAKE IT==Makes her hair fall out= per daughter who is poa   Macrobid [nitrofurantoin Macrocrystal] Rash   Per MAR   Penicillins    Per daughter   Azithromycin Other (See Comments)   Per MAR    Doxycycline Other (See Comments)   Per MAR   Escitalopram Oxalate Other (See Comments)   Per MAR   Fioricet [butalbital-apap-caffeine] Other (See Comments)   Drunk. Per MAR   Latex Other (See Comments)   Per MAR   Levofloxacin Other (See Comments)   Per MAR   Nsaids Other (See Comments)   This is not an allergy. The patient was told by Dr. Rockne Menghini to avoid NSAIDs and stop Vicoprofen in to 2014 to avoid nephrotoxicity. Per MAR.   Oxycodone Other (See Comments)   reaction to synthetic codeine Per MAR   Restasis [cyclosporine] Other (See Comments)   Per MAR   Sulfa Antibiotics Other (See Comments)   Per MAR   Biaxin [clarithromycin] Rash   With burning sensation Per [facility]      Medication List        Accurate as of 07/30/17 11:01 AM. Always use your most recent med list.          acetaminophen 325 MG tablet Commonly known as:  TYLENOL Take 650 mg by mouth every 6 (six) hours as needed for headache.   albuterol 108 (90 Base) MCG/ACT inhaler Commonly known as:  PROVENTIL HFA;VENTOLIN HFA Inhale 1 puff into the lungs every 6 (six) hours as needed for shortness of breath.   Biotin 5000 MCG Caps Take 1 capsule by mouth daily.   clonazePAM 0.5 MG tablet Commonly known as:  KLONOPIN Take 0.25 mg by mouth 2 (two) times daily.   Cranberry 425 MG Caps Take 425 mg by mouth 2 (two) times daily.   docusate sodium 100 MG capsule Commonly known as:  COLACE Take 100 mg by mouth 2 (two) times daily.   dorzolamide-timolol 22.3-6.8 MG/ML ophthalmic solution Commonly known as:  COSOPT Place 1 drop into both eyes 2 (two) times daily.   ENSURE Take 237 mLs by mouth 4 (four) times daily. D/t weight loss   ferrous sulfate 325 (65 FE) MG tablet Take 325 mg by mouth daily.   fludrocortisone 0.1 MG tablet Commonly known as:  FLORINEF Take 0.1 mg by mouth every morning. for orthostatic hypotension   HYDROcodone-acetaminophen 5-325 MG tablet Commonly known as:  NORCO/VICODIN Take 1  tablet by mouth 3 (three) times daily. At 9 am , 1 pm , and 9 pm for pain   lactulose 10 GM/15ML solution Commonly known as:  CHRONULAC Take 15 mLs (10 g total) by mouth daily as needed for severe constipation.   magnesium hydroxide 400 MG/5ML suspension Commonly known as:  MILK OF MAGNESIA Take 30 mLs by mouth daily as needed for mild constipation.   mirtazapine 15 MG tablet Commonly known as:  REMERON Take 7.5 mg by mouth at bedtime.   nitroGLYCERIN 0.4 MG SL tablet Commonly known as:  NITROSTAT Place 0.4 mg under the tongue every 5 (five) minutes as needed for chest pain. May use 3 times   polyethylene glycol packet Commonly known as:  MIRALAX / GLYCOLAX Take 17 g by mouth daily.   QUEtiapine 300 MG tablet Commonly known as:  SEROQUEL Take 300 mg by mouth at bedtime. Give with a 50 mg tablet to =  350 mg   SEROQUEL XR 50 MG Tb24 24 hr tablet Generic drug:  QUEtiapine Take 50 mg by mouth at bedtime. Give with a 300 mg tablet to = 350 mg   senna 8.6 MG tablet Commonly known as:  SENOKOT Take 2 tablets by mouth 2 (two) times daily as needed for constipation.   travoprost (benzalkonium) 0.004 % ophthalmic solution Commonly known as:  TRAVATAN Place 1 drop into both eyes at bedtime.   vitamin B-12 1000 MCG tablet Commonly known as:  CYANOCOBALAMIN Take 1,000 mcg by mouth daily.   Vitamin D 1000 units capsule Take 1,000 Units by mouth daily with breakfast.       No orders of the defined types were placed in this encounter.   Immunization History  Administered Date(s) Administered  . Influenza Split 11/13/2011  . Influenza-Unspecified 02/13/2008, 11/30/2013, 12/20/2016  . PPD Test 12/15/2015  . Pneumococcal-Unspecified 02/13/2008, 10/16/2016  . Td 02/12/2009    Social History   Tobacco Use  . Smoking status: Former Smoker    Packs/day: 0.50    Types: Cigarettes  . Smokeless tobacco: Never Used  Substance Use Topics  . Alcohol use: No    Review of  Systems  DATA OBTAINED: from patient-very limited; nursing- no acute concerns GENERAL:  no fevers, fatigue, appetite changes SKIN: No itching, rash HEENT: No complaint RESPIRATORY: No cough, wheezing, SOB CARDIAC: No chest pain, palpitations, lower extremity edema  GI: No abdominal pain, No N/V/D or constipation, No heartburn or reflux  GU: No dysuria, frequency or urgency, or incontinence  MUSCULOSKELETAL: No unrelieved bone/joint pain NEUROLOGIC: No headache, dizziness  PSYCHIATRIC: No overt anxiety or sadness  Vitals:   07/30/17 1042  BP: (!) 96/51  Pulse: 68  Resp: 18  Temp: (!) 97 F (36.1 C)  SpO2: 96%   Body mass index is 16.76 kg/m. Physical Exam  GENERAL APPEARANCE: Alert,  minimally conversant, No acute distress  SKIN: No diaphoresis rash HEENT: Unremarkable RESPIRATORY: Breathing is even, unlabored. Lung sounds are clear   CARDIOVASCULAR: Heart RRR no murmurs, rubs or gallops. No peripheral edema  GASTROINTESTINAL: Abdomen is soft, non-tender, not distended w/ normal bowel sounds.  GENITOURINARY: Bladder non tender, not distended  MUSCULOSKELETAL: No abnormal joints or musculature NEUROLOGIC: Cranial nerves 2-12 grossly intact. Moves all extremities PSYCHIATRIC: Mood and affect dementia, no behavioral issues  Patient Active Problem List   Diagnosis Date Noted  . Abnormal EKG 01/24/2017  . Delusions ([place])   . Paranoia ([place])   . Vitamin D deficiency 08/25/2016  . Dementia without behavioral disturbance 03/25/2016  . S/P ORIF (open reduction internal fixation) fracture 01/08/2016  . Oral thrush 01/08/2016  . HTN (hypertension) 01/08/2016  . Closed hip fracture requiring operative repair, right, with routine healing, subsequent encounter 12/19/2015  . Bipolar disease, chronic ([place]) 12/17/2015  . Decubitus ulcer of hip, stage 3 ([place]) 12/11/2015  . Legally blind 08/16/2015  . Ankle pain   . Encephalopathy   . Malnutrition of moderate degree 01/29/2015  .  Altered mental status 01/26/2015  . Nausea and vomiting 01/17/2015  . Esophageal dysmotilities   . Urinary tract infectious disease   . Weakness 01/14/2015  . Schatzki's ring 01/14/2015  . CKD (chronic kidney disease), stage III ([place]) 01/14/2015  . Esophagus disorder   . Dysphagia 01/11/2015  . UTI (lower urinary tract infection) 03/30/2014  . Hearing loss, sensorineural 03/04/2014  . Nasal lesion 03/04/2014  . Constipation 12/12/2012  . Hypokalemia 12/11/2012  . Orthostatic hypotension 12/11/2012  .  Arterial tortuosity (aorta) 12/11/2012  . Protein-calorie malnutrition, severe ([place]) 12/10/2012  . Adult failure to thrive 12/09/2012  . Decreased rectal sphincter tone 11/21/2012  . Primary open angle glaucoma of both eyes, severe stage 11/11/2012  . Idiopathic scoliosis 07/08/2012  . Macular atrophy, retinal 06/26/2011  . Vision disturbance 06/26/2011  . Glaucoma 06/26/2011  . Nonexudative senile macular degeneration of retina 06/21/2011  . Macula scar of posterior pole 06/21/2011  . Status post intraocular lens implant 06/21/2011  . Anemia of chronic disease 04/17/2011  . Drug-seeking behavior 02/26/2011  . Osteoporosis 02/26/2011  . Depression   . Anxiety   . COPD (chronic obstructive pulmonary disease) ([place])   . GERD (gastroesophageal reflux disease)   . Migraines   . Insomnia, unspecified     CMP     Component Value Date/Time   NA 143 02/22/2017   K 4.4 02/22/2017   CL 109 12/12/2016 1428   CO2 22 12/12/2016 1428   GLUCOSE 90 12/12/2016 1428   BUN 17 02/22/2017   CREATININE 0.6 02/22/2017   CREATININE 0.67 12/12/2016 1428   CREATININE 0.85 11/09/2013 1135   CALCIUM 9.3 12/12/2016 1428   PROT 6.5 12/12/2016 1428   ALBUMIN 3.4 (L) 12/12/2016 1428   AST 16 02/22/2017   ALT 9 02/22/2017   ALKPHOS 117 02/22/2017   BILITOT 0.7 12/12/2016 1428   GFRNONAA >60 12/12/2016 1428   GFRNONAA 64 11/09/2013 1135   GFRAA >60 12/12/2016 1428   GFRAA 74 11/09/2013 1135    Recent Labs    09/03/16 2005 12/12/16 1428 02/22/17  NA 139 142 143  K 3.9 3.6 4.4  CL 105 109  --   CO2 25 22  --   GLUCOSE 117* 90  --   BUN 22* 14 17  CREATININE 0.70 0.67 0.6  CALCIUM 9.3 9.3  --    Recent Labs    09/03/16 2005 12/12/16 1428 02/22/17  AST 22 20 16   ALT 12* 16 9  ALKPHOS 106 85 117  BILITOT 0.3 0.7  --   PROT 7.0 6.5  --   ALBUMIN 3.7 3.4*  --    Recent Labs    09/03/16 2005 12/12/16 1428 02/22/17 03/19/17  WBC 8.0 7.8 4.8 7.8  NEUTROABS 4.5 4.6  --   --   HGB 11.3* 12.8 13.2 15.6  HCT 35.6* 39.9 40 48.6  MCV 77.6* 80.4  --   --   PLT 320 239 218  --    No results for input(s): CHOL, LDLCALC, TRIG in the last 8760 hours.  Invalid input(s): HCL No results found for: [facility] Lab Results  Component Value Date   TSH 2.61 06/11/2016   Lab Results  Component Value Date   HGBA1C 5.4 06/11/2016   Lab Results  Component Value Date   CHOL 190 06/11/2016   HDL 54 06/11/2016   LDLCALC 116 06/11/2016   TRIG 101 06/11/2016    Significant Diagnostic Results in last 30 days:  No results found.  Assessment and Plan  Dementia/failure to thrive/family meeting with patient present- patient's daughter has some questions about patient's medications, in particular her Seroquel clonazepam and Norco; I discussed patient's failure to thrive picture and she seemed to understand this; I did promise her I would take a laboratory evaluation of her one more time but do not expect to see anything new; have ordered CMP, sed rate, CBC, TSH, vitamin D, and fasting lipid panel    Time spent greater than 35 minutes Maxim Bedel D.  [name], MD

## 2017-07-31 DIAGNOSIS — M2550 Pain in unspecified joint: Secondary | ICD-10-CM | POA: Diagnosis not present

## 2017-07-31 DIAGNOSIS — E559 Vitamin D deficiency, unspecified: Secondary | ICD-10-CM | POA: Diagnosis not present

## 2017-07-31 DIAGNOSIS — E039 Hypothyroidism, unspecified: Secondary | ICD-10-CM | POA: Diagnosis not present

## 2017-07-31 DIAGNOSIS — D638 Anemia in other chronic diseases classified elsewhere: Secondary | ICD-10-CM | POA: Diagnosis not present

## 2017-07-31 DIAGNOSIS — E785 Hyperlipidemia, unspecified: Secondary | ICD-10-CM | POA: Diagnosis not present

## 2017-07-31 LAB — VITAMIN D 25 HYDROXY (VIT D DEFICIENCY, FRACTURES): Vit D, 25-Hydroxy: 45.91

## 2017-07-31 LAB — LIPID PANEL
Cholesterol: 134
HDL: 53
LDL (calc): 61
Triglycerides: 102

## 2017-07-31 LAB — COMPLETE METABOLIC PANEL WITH GFR
ALBUMIN: 3.5
ALT: 5
AST: 14
Alkaline Phosphatase: 87
BUN: 23 — AB (ref 4–21)
CALCIUM: 8.9
Creat: 0.62
GFR CALC NON AF AMER: 81.51
Glucose: 90
Potassium: 5
SODIUM: 140
Total Bilirubin: 0.3
Total Protein: 5.8 g/dL

## 2017-07-31 LAB — TSH: TSH: 0.59

## 2017-07-31 LAB — SEDIMENTATION RATE: Sedimentation Rate-Westergren: 15

## 2017-07-31 LAB — CBC
HCT: 37.3
HEMOGLOBIN: 12.4
MCV: 87.2
PLATELET COUNT: 238
WBC: 5.7

## 2017-08-01 DIAGNOSIS — F411 Generalized anxiety disorder: Secondary | ICD-10-CM | POA: Diagnosis not present

## 2017-08-01 DIAGNOSIS — R441 Visual hallucinations: Secondary | ICD-10-CM | POA: Diagnosis not present

## 2017-08-01 DIAGNOSIS — R440 Auditory hallucinations: Secondary | ICD-10-CM | POA: Diagnosis not present

## 2017-08-01 DIAGNOSIS — F0390 Unspecified dementia without behavioral disturbance: Secondary | ICD-10-CM | POA: Diagnosis not present

## 2017-08-02 ENCOUNTER — Encounter: Payer: Self-pay | Admitting: *Deleted

## 2017-08-03 ENCOUNTER — Encounter: Payer: Self-pay | Admitting: Internal Medicine

## 2017-08-03 NOTE — Assessment & Plan Note (Signed)
Most recent vitamin D levels 45; continue 1000 units daily to maintain his level

## 2017-08-03 NOTE — Assessment & Plan Note (Signed)
Maintaining; continue Florinef 0.1 mg p.o. daily

## 2017-08-03 NOTE — Assessment & Plan Note (Signed)
At times escalates; patient is now on Seroquel 350 mg nightly

## 2017-08-14 DIAGNOSIS — F39 Unspecified mood [affective] disorder: Secondary | ICD-10-CM | POA: Diagnosis not present

## 2017-08-14 DIAGNOSIS — F411 Generalized anxiety disorder: Secondary | ICD-10-CM | POA: Diagnosis not present

## 2017-08-14 DIAGNOSIS — F0390 Unspecified dementia without behavioral disturbance: Secondary | ICD-10-CM | POA: Diagnosis not present

## 2017-08-14 DIAGNOSIS — R440 Auditory hallucinations: Secondary | ICD-10-CM | POA: Diagnosis not present

## 2017-08-22 DIAGNOSIS — J449 Chronic obstructive pulmonary disease, unspecified: Secondary | ICD-10-CM | POA: Diagnosis not present

## 2017-08-22 DIAGNOSIS — M6281 Muscle weakness (generalized): Secondary | ICD-10-CM | POA: Diagnosis not present

## 2017-08-22 DIAGNOSIS — Z9181 History of falling: Secondary | ICD-10-CM | POA: Diagnosis not present

## 2017-08-23 ENCOUNTER — Encounter: Payer: Self-pay | Admitting: Internal Medicine

## 2017-08-23 NOTE — Assessment & Plan Note (Signed)
Stable and chronic;Continue Travatan 1 drop each eye daily at bedtime and Cosopt 1 drop each eye twice daily

## 2017-08-23 NOTE — Assessment & Plan Note (Signed)
Appears to be doing well on clonazepam 0.25 mg twice daily; will continue current meds

## 2017-08-23 NOTE — Progress Notes (Signed)
Location:  [place] of Service:  SNF (31)  [name], MD  Patient Care Team: [name], MD as PCP - General (Internal Medicine) [name], MD (Gastroenterology) [name], NP (Obstetrics and Gynecology)  Extended Emergency Contact Information Primary Emergency Contact: [name],[name] Address: [address], [place] Phone: [phone] Mobile Phone: [phone] Relation: Daughter Secondary Emergency Contact: [name] Phone: [phone] Mobile Phone: [phone] Relation: Daughter    Allergies: Amoxicillin; Ampicillin; Depakote [valproic acid]; Macrobid [nitrofurantoin macrocrystal]; Penicillins; Azithromycin; Doxycycline; Escitalopram oxalate; Fioricet [butalbital-apap-caffeine]; Latex; Levofloxacin; Nsaids; Oxycodone; Restasis [cyclosporine]; Sulfa antibiotics; and Biaxin [clarithromycin]  Chief Complaint  Patient presents with  . Medical Management of Chronic Issues    HPI: [name] who is being seen for routine issues of anxiety, GERD, and glaucoma.  Past Medical History:  Diagnosis Date  . Anxiety   . Arterial tortuosity (aorta) 12/11/2012  . Asthma   . Chronic kidney disease, stage IV (severe) ([place]) 12/11/2012  . Chronic mental illness   . CKD (chronic kidney disease), stage III ([place]) 01/14/2015  . Closed hip fracture requiring operative repair, right, with routine healing, subsequent encounter 12/19/2015  . Colitis   . COPD (chronic obstructive pulmonary disease) ([employer])   . Decreased rectal sphincter tone 11/21/2012  . Depression   . Drug-seeking behavior 02/26/2011   Use of multiple md to obtain benzos/narcotics pt must sign contract to receive controlled meds.   Marland Kitchen Dysphagia 01/11/2015  . GERD (gastroesophageal reflux disease)   . Glaucoma 06/26/2011  . Glaucoma of both eyes   . Hearing loss,  sensorineural 03/04/2014   [facility]; 02/26/14. There is not a medical or surgical treatment that would benefit her type of hearing loss. We discussed amplification in an overview fashion.    Marland Kitchen History of fall   . Insomnia, unspecified   . Macular degeneration 06/26/2011  . Migraines   . Nasal bone fracture 03/30/2014  . Osteoporosis   . Osteoporosis 02/26/2011  . Presence of right artificial hip joint   . Protein-calorie malnutrition, severe ([place]) 12/10/2012  . S/P ORIF (open reduction internal fixation) fracture 01/08/2016  . Schatzki's ring 01/14/2015  . Scoliosis   . Substance abuse ([place])    Benbzos and Narcotics, she does not get any of those medicines from [facility], we have empathetically told her that.  . Vertigo   . Vitamin B deficiency   . Vitamin D deficiency 08/25/2016  . Weight loss     Past Surgical History:  Procedure Laterality Date  . ABDOMINAL HYSTERECTOMY    . CATARACT EXTRACTION    . CHOLECYSTECTOMY    . ESOPHAGOGASTRODUODENOSCOPY (EGD) WITH PROPOFOL N/A 01/13/2015   Procedure: ESOPHAGOGASTRODUODENOSCOPY (EGD) WITH PROPOFOL;  Surgeon: [name], MD;  Location: [facility] END[facility];  Service: Endoscopy;  Laterality: N/A;  . HIP ARTHROPLASTY Right 12/21/2015   Procedure: ARTHROPLASTY BIPOLAR HIP (HEMIARTHROPLASTY);  Surgeon: [name], MD;  Location: [place];  Service: Orthopedics;  Laterality: Right;  . PARTIAL HYSTERECTOMY      Allergies as of 07/29/2017      Reactions   Amoxicillin    Per daughter   Ampicillin    Per daughter   Depakote [valproic Acid]    DOES NOT WANT HER TO TAKE IT==Makes her hair fall out= per daughter who is poa   Macrobid [nitrofurantoin Macrocrystal]  Rash   Per MAR   Penicillins    Per daughter   Azithromycin Other (See Comments)   Per MAR   Doxycycline Other (See Comments)   Per MAR   Escitalopram Oxalate Other (See Comments)   Per MAR   Fioricet [butalbital-apap-caffeine] Other (See Comments)   Drunk. Per MAR   Latex  Other (See Comments)   Per MAR   Levofloxacin Other (See Comments)   Per MAR   Nsaids Other (See Comments)   This is not an allergy. The patient was told by Dr. Rockne Menghini to avoid NSAIDs and stop Vicoprofen in to 2014 to avoid nephrotoxicity. Per MAR.   Oxycodone Other (See Comments)   reaction to synthetic codeine Per MAR   Restasis [cyclosporine] Other (See Comments)   Per MAR   Sulfa Antibiotics Other (See Comments)   Per MAR   Biaxin [clarithromycin] Rash   With burning sensation Per [facility]      Medication List        Accurate as of 07/29/17 11:59 PM. Always use your most recent med list.          acetaminophen 325 MG tablet Commonly known as:  TYLENOL Take 650 mg by mouth every 6 (six) hours as needed for headache.   albuterol 108 (90 Base) MCG/ACT inhaler Commonly known as:  PROVENTIL HFA;VENTOLIN HFA Inhale 1 puff into the lungs every 6 (six) hours as needed for shortness of breath.   Biotin 5000 MCG Caps Take 1 capsule by mouth daily.   clonazePAM 0.5 MG tablet Commonly known as:  KLONOPIN Take 0.25 mg by mouth 2 (two) times daily.   Cranberry 425 MG Caps Take 425 mg by mouth 2 (two) times daily.   docusate sodium 100 MG capsule Commonly known as:  COLACE Take 100 mg by mouth 2 (two) times daily.   dorzolamide-timolol 22.3-6.8 MG/ML ophthalmic solution Commonly known as:  COSOPT Place 1 drop into both eyes 2 (two) times daily.   ENSURE Take 237 mLs by mouth 4 (four) times daily. D/t weight loss   ferrous sulfate 325 (65 FE) MG tablet Take 325 mg by mouth daily.   fludrocortisone 0.1 MG tablet Commonly known as:  FLORINEF Take 0.1 mg by mouth every morning. for orthostatic hypotension   HYDROcodone-acetaminophen 5-325 MG tablet Commonly known as:  NORCO/VICODIN Take 1 tablet by mouth 3 (three) times daily. At 9 am , 1 pm , and 9 pm for pain   lactulose 10 GM/15ML solution Commonly known as:  CHRONULAC Take 15 mLs (10 g total) by mouth daily as needed  for severe constipation.   magnesium hydroxide 400 MG/5ML suspension Commonly known as:  MILK OF MAGNESIA Take 30 mLs by mouth daily as needed for mild constipation.   mirtazapine 15 MG tablet Commonly known as:  REMERON Take 7.5 mg by mouth at bedtime.   nitroGLYCERIN 0.4 MG SL tablet Commonly known as:  NITROSTAT Place 0.4 mg under the tongue every 5 (five) minutes as needed for chest pain. May use 3 times   polyethylene glycol packet Commonly known as:  MIRALAX / GLYCOLAX Take 17 g by mouth daily.   QUEtiapine 300 MG tablet Commonly known as:  SEROQUEL Take 300 mg by mouth at bedtime. Give with a 50 mg tablet to = 350 mg   SEROQUEL XR 50 MG Tb24 24 hr tablet Generic drug:  QUEtiapine Take 50 mg by mouth at bedtime. Give with a 300 mg tablet to = 350 mg  senna 8.6 MG tablet Commonly known as:  SENOKOT Take 2 tablets by mouth 2 (two) times daily as needed for constipation.   travoprost (benzalkonium) 0.004 % ophthalmic solution Commonly known as:  TRAVATAN Place 1 drop into both eyes at bedtime.   vitamin B-12 1000 MCG tablet Commonly known as:  CYANOCOBALAMIN Take 1,000 mcg by mouth daily.   Vitamin D 1000 units capsule Take 1,000 Units by mouth daily with breakfast.       No orders of the defined types were placed in this encounter.   Immunization History  Administered Date(s) Administered  . Influenza Split 11/13/2011  . Influenza-Unspecified 02/13/2008, 11/30/2013, 12/20/2016  . PPD Test 12/15/2015  . Pneumococcal-Unspecified 02/13/2008, 10/16/2016  . Td 02/12/2009    Social History   Tobacco Use  . Smoking status: Former Smoker    Packs/day: 0.50    Types: Cigarettes  . Smokeless tobacco: Never Used  Substance Use Topics  . Alcohol use: No    Review of Systems  DATA OBTAINED: from patient-limited; nursing-no acute concerns GENERAL:  no fevers, fatigue, appetite changes SKIN: No itching, rash HEENT: No complaint RESPIRATORY: No cough,  wheezing, SOB CARDIAC: No chest pain, palpitations, lower extremity edema  GI: No abdominal pain, No N/V/D or constipation, No heartburn or reflux  GU: No dysuria, frequency or urgency, or incontinence  MUSCULOSKELETAL: No unrelieved bone/joint pain NEUROLOGIC: No headache, dizziness  PSYCHIATRIC: No overt anxiety or sadness  Vitals:   08/23/17 2200  BP: (!) 96/51  Pulse: 68  Resp: 18  Temp: (!) 97 F (36.1 C)   There is no height or weight on file to calculate BMI. Physical Exam  GENERAL APPEARANCE: Alert, minimally conversant , No acute distress  SKIN: No diaphoresis rash HEENT: Unremarkable RESPIRATORY: Breathing is even, unlabored. Lung sounds are clear   CARDIOVASCULAR: Heart RRR no murmurs, rubs or gallops. No peripheral edema  GASTROINTESTINAL: Abdomen is soft, non-tender, not distended w/ normal bowel sounds.  GENITOURINARY: Bladder non tender, not distended  MUSCULOSKELETAL: No abnormal joints or musculature except extremely thin NEUROLOGIC: Cranial nerves 2-12 grossly intact. Moves all extremities PSYCHIATRIC: Mood and affect dementia, no behavioral issues  Patient Active Problem List   Diagnosis Date Noted  . Abnormal EKG 01/24/2017  . Delusions ([place])   . Paranoia ([place])   . Vitamin D deficiency 08/25/2016  . Dementia without behavioral disturbance 03/25/2016  . S/P ORIF (open reduction internal fixation) fracture 01/08/2016  . Oral thrush 01/08/2016  . HTN (hypertension) 01/08/2016  . Closed hip fracture requiring operative repair, right, with routine healing, subsequent encounter 12/19/2015  . Bipolar disease, chronic ([place]) 12/17/2015  . Decubitus ulcer of hip, stage 3 ([place]) 12/11/2015  . Legally blind 08/16/2015  . Ankle pain   . Encephalopathy   . Malnutrition of moderate degree 01/29/2015  . Altered mental status 01/26/2015  . Nausea and vomiting 01/17/2015  . Esophageal dysmotilities   . Urinary tract infectious disease   . Weakness 01/14/2015  .  Schatzki's ring 01/14/2015  . CKD (chronic kidney disease), stage III ([place]) 01/14/2015  . Esophagus disorder   . Dysphagia 01/11/2015  . UTI (lower urinary tract infection) 03/30/2014  . Hearing loss, sensorineural 03/04/2014  . Nasal lesion 03/04/2014  . Constipation 12/12/2012  . Hypokalemia 12/11/2012  . Orthostatic hypotension 12/11/2012  . Arterial tortuosity (aorta) 12/11/2012  . Protein-calorie malnutrition, severe ([place]) 12/10/2012  . Adult failure to thrive 12/09/2012  . Decreased rectal sphincter tone 11/21/2012  . Primary open angle glaucoma of  both eyes, severe stage 11/11/2012  . Idiopathic scoliosis 07/08/2012  . Macular atrophy, retinal 06/26/2011  . Vision disturbance 06/26/2011  . Glaucoma 06/26/2011  . Nonexudative senile macular degeneration of retina 06/21/2011  . Macula scar of posterior pole 06/21/2011  . Status post intraocular lens implant 06/21/2011  . Anemia of chronic disease 04/17/2011  . Drug-seeking behavior 02/26/2011  . Osteoporosis 02/26/2011  . Depression   . Anxiety   . COPD (chronic obstructive pulmonary disease) ([place])   . GERD (gastroesophageal reflux disease)   . Migraines   . Insomnia, unspecified     CMP     Component Value Date/Time   NA 140 07/31/2017   K 5.0 07/31/2017   CL 109 12/12/2016 1428   CO2 22 12/12/2016 1428   GLUCOSE 90 12/12/2016 1428   BUN 23 (A) 07/31/2017   CREATININE 0.62 07/31/2017   CALCIUM 8.9 07/31/2017   PROT 5.8 07/31/2017   ALBUMIN 3.5 07/31/2017   AST 14 07/31/2017   ALT 5 07/31/2017   ALKPHOS 87 07/31/2017   BILITOT 0.3 07/31/2017   GFRNONAA 81.51 07/31/2017   GFRNONAA 64 11/09/2013 1135   GFRAA >60 12/12/2016 1428   GFRAA 74 11/09/2013 1135   Recent Labs    09/03/16 2005 12/12/16 1428 02/22/17 07/31/17  NA 139 142 143 140  K 3.9 3.6 4.4 5.0  CL 105 109  --   --   CO2 25 22  --   --   GLUCOSE 117* 90  --   --   BUN 22* 14 17 23*  CREATININE 0.70 0.67 0.6 0.62  CALCIUM 9.3 9.3  --  8.9     Recent Labs    09/03/16 2005 12/12/16 1428 02/22/17 07/31/17  AST 22 20 16 14   ALT 12* 16 9 5   ALKPHOS 106 85 117 87  BILITOT 0.3 0.7  --  0.3  PROT 7.0 6.5  --  5.8  ALBUMIN 3.7 3.4*  --  3.5   Recent Labs    09/03/16 2005 12/12/16 1428 02/22/17 03/19/17 07/31/17  WBC 8.0 7.8 4.8 7.8 5.7  NEUTROABS 4.5 4.6  --   --   --   HGB 11.3* 12.8 13.2 15.6 12.4  HCT 35.6* 39.9 40 48.6 37.3  MCV 77.6* 80.4  --   --  87.2  PLT 320 239 218  --   --    Recent Labs    07/31/17  CHOL 134  LDLCALC 61  TRIG 102   No results found for: [facility] Lab Results  Component Value Date   TSH 0.59 07/31/2017   Lab Results  Component Value Date   HGBA1C 5.4 06/11/2016   Lab Results  Component Value Date   CHOL 134 07/31/2017   HDL 54 06/11/2016   LDLCALC 61 07/31/2017   TRIG 102 07/31/2017    Significant Diagnostic Results in last 30 days:  No results found.  Assessment and Plan  Anxiety Appears to be doing well on clonazepam 0.25 mg twice daily; will continue current meds  GERD (gastroesophageal reflux disease) Controlled on milk of magnesia 30 cc daily; continue current medication  Glaucoma Stable and chronic;Continue Travatan 1 drop each eye daily at bedtime and Cosopt 1 drop each eye twice daily     Inocencio Homes, MD

## 2017-08-23 NOTE — Assessment & Plan Note (Signed)
Controlled on milk of magnesia 30 cc daily; continue current medication

## 2017-08-26 NOTE — Progress Notes (Signed)
Opened in error

## 2017-08-27 ENCOUNTER — Non-Acute Institutional Stay (SKILLED_NURSING_FACILITY): Payer: Medicare Other

## 2017-08-27 DIAGNOSIS — Z9181 History of falling: Secondary | ICD-10-CM | POA: Diagnosis not present

## 2017-08-27 DIAGNOSIS — Z0000 Encounter for general adult medical examination without abnormal findings: Secondary | ICD-10-CM | POA: Diagnosis not present

## 2017-08-27 DIAGNOSIS — M6281 Muscle weakness (generalized): Secondary | ICD-10-CM | POA: Diagnosis not present

## 2017-08-27 DIAGNOSIS — J449 Chronic obstructive pulmonary disease, unspecified: Secondary | ICD-10-CM | POA: Diagnosis not present

## 2017-08-27 NOTE — Patient Instructions (Signed)
[name] , Thank you for taking time to come for your Medicare Wellness Visit. I appreciate your ongoing commitment to your health goals. Please review the following plan we discussed and let me know if I can assist you in the future.   Screening recommendations/referrals: Colonoscopy excluded, over age 82 Mammogram excluded, over age 38 Bone Density up to date Recommended yearly ophthalmology/optometry visit for glaucoma screening and checkup Recommended yearly dental visit for hygiene and checkup  Vaccinations: Influenza vaccine up to date, due 2019 fall season Pneumococcal vaccine up to date, completed Tdap vaccine up to date, due 02/13/2019 Shingles vaccine not in past records    Advanced directives: in chart  Conditions/risks identified: none  Next appointment: Dr. [name] makes rounds   Preventive Care 65 Years and Older, Female Preventive care refers to lifestyle choices and visits with your health care provider that can promote health and wellness. What does preventive care include?  A yearly physical exam. This is also called an annual well check.  Dental exams once or twice a year.  Routine eye exams. Ask your health care provider how often you should have your eyes checked.  Personal lifestyle choices, including:  Daily care of your teeth and gums.  Regular physical activity.  Eating a healthy diet.  Avoiding tobacco and drug use.  Limiting alcohol use.  Practicing safe sex.  Taking low-dose aspirin every day.  Taking vitamin and mineral supplements as recommended by your health care provider. What happens during an annual well check? The services and screenings done by your health care provider during your annual well check will depend on your age, overall health, lifestyle risk factors, and family history of disease. Counseling  Your health care provider may ask you questions about your:  Alcohol use.  Tobacco use.  Drug use.  Emotional  well-being.  Home and relationship well-being.  Sexual activity.  Eating habits.  History of falls.  Memory and ability to understand (cognition).  Work and work Statistician.  Reproductive health. Screening  You may have the following tests or measurements:  Height, weight, and BMI.  Blood pressure.  Lipid and cholesterol levels. These may be checked every 5 years, or more frequently if you are over 5 years old.  Skin check.  Lung cancer screening. You may have this screening every year starting at age 37 if you have a 30-pack-year history of smoking and currently smoke or have quit within the past 15 years.  Fecal occult blood test (FOBT) of the stool. You may have this test every year starting at age 3.  Flexible sigmoidoscopy or colonoscopy. You may have a sigmoidoscopy every 5 years or a colonoscopy every 10 years starting at age 55.  Hepatitis C blood test.  Hepatitis B blood test.  Sexually transmitted disease (STD) testing.  Diabetes screening. This is done by checking your blood sugar (glucose) after you have not eaten for a while (fasting). You may have this done every 1-3 years.  Bone density scan. This is done to screen for osteoporosis. You may have this done starting at age 65.  Mammogram. This may be done every 1-2 years. Talk to your health care provider about how often you should have regular mammograms. Talk with your health care provider about your test results, treatment options, and if necessary, the need for more tests. Vaccines  Your health care provider may recommend certain vaccines, such as:  Influenza vaccine. This is recommended every year.  Tetanus, diphtheria, and acellular pertussis (Tdap,  Td) vaccine. You may need a Td booster every 10 years.  Zoster vaccine. You may need this after age 20.  Pneumococcal 13-valent conjugate (PCV13) vaccine. One dose is recommended after age 70.  Pneumococcal polysaccharide (PPSV23) vaccine. One  dose is recommended after age 52. Talk to your health care provider about which screenings and vaccines you need and how often you need them. This information is not intended to replace advice given to you by your health care provider. Make sure you discuss any questions you have with your health care provider. Document Released: 02/25/2015 Document Revised: 10/19/2015 Document Reviewed: 11/30/2014 Elsevier Interactive Patient Education  2017 [place] Prevention in the Home Falls can cause injuries. They can happen to people of all ages. There are many things you can do to make your home safe and to help prevent falls. What can I do on the outside of my home?  Regularly fix the edges of walkways and driveways and fix any cracks.  Remove anything that might make you trip as you walk through a door, such as a raised step or threshold.  Trim any bushes or trees on the path to your home.  Use bright outdoor lighting.  Clear any walking paths of anything that might make someone trip, such as rocks or tools.  Regularly check to see if handrails are loose or broken. Make sure that both sides of any steps have handrails.  Any raised decks and porches should have guardrails on the edges.  Have any leaves, snow, or ice cleared regularly.  Use sand or salt on walking paths during winter.  Clean up any spills in your garage right away. This includes oil or grease spills. What can I do in the bathroom?  Use night lights.  Install grab bars by the toilet and in the tub and shower. Do not use towel bars as grab bars.  Use non-skid mats or decals in the tub or shower.  If you need to sit down in the shower, use a plastic, non-slip stool.  Keep the floor dry. Clean up any water that spills on the floor as soon as it happens.  Remove soap buildup in the tub or shower regularly.  Attach bath mats securely with double-sided non-slip rug tape.  Do not have throw rugs and other  things on the floor that can make you trip. What can I do in the bedroom?  Use night lights.  Make sure that you have a light by your bed that is easy to reach.  Do not use any sheets or blankets that are too big for your bed. They should not hang down onto the floor.  Have a firm chair that has side arms. You can use this for support while you get dressed.  Do not have throw rugs and other things on the floor that can make you trip. What can I do in the kitchen?  Clean up any spills right away.  Avoid walking on wet floors.  Keep items that you use a lot in easy-to-reach places.  If you need to reach something above you, use a strong step stool that has a grab bar.  Keep electrical cords out of the way.  Do not use floor polish or wax that makes floors slippery. If you must use wax, use non-skid floor wax.  Do not have throw rugs and other things on the floor that can make you trip. What can I do with my stairs?  Do not  leave any items on the stairs.  Make sure that there are handrails on both sides of the stairs and use them. Fix handrails that are broken or loose. Make sure that handrails are as long as the stairways.  Check any carpeting to make sure that it is firmly attached to the stairs. Fix any carpet that is loose or worn.  Avoid having throw rugs at the top or bottom of the stairs. If you do have throw rugs, attach them to the floor with carpet tape.  Make sure that you have a light switch at the top of the stairs and the bottom of the stairs. If you do not have them, ask someone to add them for you. What else can I do to help prevent falls?  Wear shoes that:  Do not have high heels.  Have rubber bottoms.  Are comfortable and fit you well.  Are closed at the toe. Do not wear sandals.  If you use a stepladder:  Make sure that it is fully opened. Do not climb a closed stepladder.  Make sure that both sides of the stepladder are locked into place.  Ask  someone to hold it for you, if possible.  Clearly mark and make sure that you can see:  Any grab bars or handrails.  First and last steps.  Where the edge of each step is.  Use tools that help you move around (mobility aids) if they are needed. These include:  Canes.  Walkers.  Scooters.  Crutches.  Turn on the lights when you go into a dark area. Replace any light bulbs as soon as they burn out.  Set up your furniture so you have a clear path. Avoid moving your furniture around.  If any of your floors are uneven, fix them.  If there are any pets around you, be aware of where they are.  Review your medicines with your doctor. Some medicines can make you feel dizzy. This can increase your chance of falling. Ask your doctor what other things that you can do to help prevent falls. This information is not intended to replace advice given to you by your health care provider. Make sure you discuss any questions you have with your health care provider. Document Released: 11/25/2008 Document Revised: 07/07/2015 Document Reviewed: 03/05/2014 Elsevier Interactive Patient Education  2017 [employer].

## 2017-08-27 NOTE — Progress Notes (Signed)
Subjective:   Taylor Roth is a 84 y.o. female who presents for Medicare Annual (Subsequent) preventive examination at [facility]  Last AWV-08/22/2016    Objective:     Vitals: BP 110/62 (BP Location: Left Arm, Patient Position: Supine)   Pulse 80   Temp 97.8 F (36.6 C) (Oral)   Ht 4\' 11"  (1.499 m)   Wt 82 lb (37.2 kg)   LMP  (LMP Unknown)   BMI 16.56 kg/m   Body mass index is 16.56 kg/m.  Advanced Directives 08/27/2017 07/30/2017 06/07/2017 05/02/2017 04/09/2017 03/18/2017 03/15/2017  Does Patient Have a Medical Advance Directive? Yes Yes Yes Yes Yes Yes Yes  Type of Advance Directive Out of facility DNR (pink MOST or yellow form) Out of facility DNR (pink MOST or yellow form) Out of facility DNR (pink MOST or yellow form) Out of facility DNR (pink MOST or yellow form) Out of facility DNR (pink MOST or yellow form) Out of facility DNR (pink MOST or yellow form) Out of facility DNR (pink MOST or yellow form)  Does patient want to make changes to medical advance directive? No - Patient declined No - Patient declined No - Patient declined - No - Patient declined No - Patient declined No - Patient declined  Copy of [place] in Chart? - - - - - - -  Would patient like information on creating a medical advance directive? - - - - - - -  Pre-existing out of facility DNR order (yellow form or pink MOST form) Yellow form placed in chart (order not valid for inpatient use) Yellow form placed in chart (order not valid for inpatient use) - Yellow form placed in chart (order not valid for inpatient use) - Yellow form placed in chart (order not valid for inpatient use) Yellow form placed in chart (order not valid for inpatient use)    Tobacco Social History   Tobacco Use  Smoking Status Former Smoker  . Packs/day: 0.50  . Types: Cigarettes  Smokeless Tobacco Never Used     Counseling given: Not Answered   Clinical Intake:  Pre-visit preparation completed:  No  Pain : No/denies pain     Nutritional Risks: None Diabetes: No  How often do you need to have someone help you when you read instructions, pamphlets, or other written materials from your doctor or pharmacy?: 4 - Often(macular degeneration)  Interpreter Needed?: No  Information entered by :: Tyson Dense, RN  Past Medical History:  Diagnosis Date  . Anxiety   . Arterial tortuosity (aorta) 12/11/2012  . Asthma   . Chronic kidney disease, stage IV (severe) ([place]) 12/11/2012  . Chronic mental illness   . CKD (chronic kidney disease), stage III ([place]) 01/14/2015  . Closed hip fracture requiring operative repair, right, with routine healing, subsequent encounter 12/19/2015  . Colitis   . COPD (chronic obstructive pulmonary disease) ([place])   . Decreased rectal sphincter tone 11/21/2012  . Depression   . Drug-seeking behavior 02/26/2011   Use of multiple md to obtain benzos/narcotics pt must sign contract to receive controlled meds.   Marland Kitchen Dysphagia 01/11/2015  . GERD (gastroesophageal reflux disease)   . Glaucoma 06/26/2011  . Glaucoma of both eyes   . Hearing loss, sensorineural 03/04/2014   [facility]; 02/26/14. There is not a medical or surgical treatment that would benefit her type of hearing loss. We discussed amplification in an overview fashion.    Marland Kitchen History of fall   .  Insomnia, unspecified   . Macular degeneration 06/26/2011  . Migraines   . Nasal bone fracture 03/30/2014  . Osteoporosis   . Osteoporosis 02/26/2011  . Presence of right artificial hip joint   . Protein-calorie malnutrition, severe ([place]) 12/10/2012  . S/P ORIF (open reduction internal fixation) fracture 01/08/2016  . Schatzki's ring 01/14/2015  . Scoliosis   . Substance abuse ([place])    Benbzos and Narcotics, she does not get any of those medicines from [facility], we have empathetically told her that.  . Vertigo   . Vitamin B deficiency   . Vitamin D deficiency 08/25/2016  . Weight loss    Past Surgical  History:  Procedure Laterality Date  . ABDOMINAL HYSTERECTOMY    . CATARACT EXTRACTION    . CHOLECYSTECTOMY    . ESOPHAGOGASTRODUODENOSCOPY (EGD) WITH PROPOFOL N/A 01/13/2015   Procedure: ESOPHAGOGASTRODUODENOSCOPY (EGD) WITH PROPOFOL;  Surgeon: [name], MD;  Location: [facility];  Service: Endoscopy;  Laterality: N/A;  . HIP ARTHROPLASTY Right 12/21/2015   Procedure: ARTHROPLASTY BIPOLAR HIP (HEMIARTHROPLASTY);  Surgeon: [name], MD;  Location: [place];  Service: Orthopedics;  Laterality: Right;  . PARTIAL HYSTERECTOMY     Family History  Problem Relation Age of Onset  . Stroke Mother   . Diabetes Mother   . Heart disease Mother   . Hearing loss Mother   . Cancer Father 65       esophageal   Social History   Socioeconomic History  . Marital status: Divorced    Spouse name: Not on file  . Number of children: Not on file  . Years of education: Not on file  . Highest education level: Not on file  Occupational History  . Occupation: retired Equities trader  . Financial resource strain: Not hard at all  . Food insecurity:    Worry: Never true    Inability: Never true  . Transportation needs:    Medical: No    Non-medical: No  Tobacco Use  . Smoking status: Former Smoker    Packs/day: 0.50    Types: Cigarettes  . Smokeless tobacco: Never Used  Substance and Sexual Activity  . Alcohol use: No  . Drug use: No  . Sexual activity: Never  Lifestyle  . Physical activity:    Days per week: 0 days    Minutes per session: 0 min  . Stress: Not at all  Relationships  . Social connections:    Talks on phone: Twice a week    Gets together: Twice a week    Attends religious service: Never    Active member of club or organization: No    Attends meetings of clubs or organizations: Never    Relationship status: Divorced  Other Topics Concern  . Not on file  Social History Narrative   Admitted to [facility] 12/15/2015   Divorced   Former Smoker    Alcohol none   DNR    Outpatient Encounter Medications as of 08/27/2017  Medication Sig  . acetaminophen (TYLENOL) 325 MG tablet Take 650 mg by mouth every 6 (six) hours as needed for headache.  . albuterol (PROVENTIL HFA;VENTOLIN HFA) 108 (90 BASE) MCG/ACT inhaler Inhale 1 puff into the lungs every 6 (six) hours as needed for shortness of breath.   . Biotin 5000 MCG CAPS Take 1 capsule by mouth daily.   . Cholecalciferol (VITAMIN D) 1000 UNITS capsule Take 1,000 Units by mouth daily with breakfast.   . clonazePAM (KLONOPIN)  0.5 MG tablet Take 0.25 mg by mouth 2 (two) times daily.   . Cranberry 425 MG CAPS Take 425 mg by mouth 2 (two) times daily.   Marland Kitchen docusate sodium (COLACE) 100 MG capsule Take 100 mg by mouth 2 (two) times daily.  . dorzolamide-timolol (COSOPT) 22.3-6.8 MG/ML ophthalmic solution Place 1 drop into both eyes 2 (two) times daily.  Marland Kitchen ENSURE (ENSURE) Take 237 mLs by mouth 4 (four) times daily. D/t weight loss  . ferrous sulfate 325 (65 FE) MG tablet Take 325 mg by mouth daily.   . fludrocortisone (FLORINEF) 0.1 MG tablet Take 0.1 mg by mouth every morning. for orthostatic hypotension  . HYDROcodone-acetaminophen (NORCO/VICODIN) 5-325 MG tablet Take 1 tablet by mouth 3 (three) times daily. At 9 am , 1 pm , and 9 pm for pain  . lactulose (CHRONULAC) 10 GM/15ML solution Take 15 mLs (10 g total) by mouth daily as needed for severe constipation.  . magnesium hydroxide (MILK OF MAGNESIA) 400 MG/5ML suspension Take 30 mLs by mouth daily as needed for mild constipation.  . mirtazapine (REMERON) 15 MG tablet Take 7.5 mg by mouth at bedtime.  . nitroGLYCERIN (NITROSTAT) 0.4 MG SL tablet Place 0.4 mg under the tongue every 5 (five) minutes as needed for chest pain. May use 3 times  . polyethylene glycol (MIRALAX / GLYCOLAX) packet Take 17 g by mouth daily.   . QUEtiapine (SEROQUEL XR) 50 MG TB24 24 hr tablet Take 50 mg by mouth at bedtime. Give with a 300 mg tablet to = 350 mg  . QUEtiapine  (SEROQUEL) 300 MG tablet Take 300 mg by mouth at bedtime. Give with a 50 mg tablet to = 350 mg  . senna (SENOKOT) 8.6 MG tablet Take 2 tablets by mouth 2 (two) times daily as needed for constipation.  . travoprost, benzalkonium, (TRAVATAN) 0.004 % ophthalmic solution Place 1 drop into both eyes at bedtime.  . vitamin B-12 (CYANOCOBALAMIN) 1000 MCG tablet Take 1,000 mcg by mouth daily.   No facility-administered encounter medications on file as of 08/27/2017.     Activities of Daily Living In your present state of health, do you have any difficulty performing the following activities: 08/27/2017 09/03/2016  Hearing? Tempie Donning  Vision? Y Y  Difficulty concentrating or making decisions? Tempie Donning  Walking or climbing stairs? Y Y  Dressing or bathing? Y Y  Doing errands, shopping? Y -  Conservation officer, nature and eating ? Y -  Using the Toilet? Y -  In the past six months, have you accidently leaked urine? Y -  Do you have problems with loss of bowel control? Y -  Managing your Medications? Y -  Managing your Finances? Y -  Housekeeping or managing your Housekeeping? Y -  Some recent data might be hidden    Patient Care Team: [name], MD as PCP - General (Internal Medicine) [name], MD (Gastroenterology) [name], NP (Obstetrics and Gynecology)    Assessment:   This is a routine wellness examination for Taylor Roth.  Exercise Activities and Dietary recommendations Current Exercise Habits: The patient does not participate in regular exercise at present, Exercise limited by: neurologic condition(s);orthopedic condition(s)  Goals    None      Fall Risk Fall Risk  08/27/2017 08/22/2016  Falls in the past year? No Yes  Number falls in past yr: - 2 or more  Injury with Fall? - Yes  Comment - back of head   Is the patient's home  free of loose throw rugs in walkways, pet beds, electrical cords, etc?   yes      Grab bars in the bathroom? yes      Handrails on the stairs?   yes       Adequate lighting?   yes  Depression Screen PHQ 2/9 Scores 08/27/2017 08/22/2016  PHQ - 2 Score 1 4  PHQ- 9 Score - 8     Cognitive Function     6CIT Screen 08/27/2017 08/22/2016  What Year? 4 points 4 points  What month? 3 points 0 points  What time? 0 points 0 points  Count back from 20 0 points 0 points  Months in reverse 4 points 4 points  Repeat phrase 10 points 10 points  Total Score 21 18    Immunization History  Administered Date(s) Administered  . Influenza Split 11/13/2011  . Influenza-Unspecified 02/13/2008, 11/30/2013, 12/20/2016  . PPD Test 12/15/2015  . Pneumococcal-Unspecified 02/13/2008, 10/16/2016  . Td 02/12/2009    Qualifies for Shingles Vaccine? Not in past records  Screening Tests Health Maintenance  Topic Date Due  . INFLUENZA VACCINE  09/12/2017  . PNA vac Low Risk Adult (2 of 2 - PCV13) 10/16/2017  . TETANUS/TDAP  02/13/2019  . DEXA SCAN  Completed    Cancer Screenings: Lung: Low Dose CT Chest recommended if Age 70-80 years, 30 pack-year currently smoking OR have quit w/in 15years. Patient does not qualify. Breast:  Up to date on Mammogram? Yes   Up to date of Bone Density/Dexa? Yes Colorectal: up to date  Additional Screenings:  Hepatitis C Screening: declined     Plan:    I have personally reviewed and addressed the Medicare Annual Wellness questionnaire and have noted the following in the patient's chart:  A. Medical and social history B. Use of alcohol, tobacco or illicit drugs  C. Current medications and supplements D. Functional ability and status E.  Nutritional status F.  Physical activity G. Advance directives H. List of other physicians I.  Hospitalizations, surgeries, and ER visits in previous 12 months J.  [place] to include hearing, vision, cognitive, depression L. Referrals and appointments - none  In addition, I have reviewed and discussed with patient certain preventive protocols, quality metrics, and  best practice recommendations. A written personalized care plan for preventive services as well as general preventive health recommendations were provided to patient.  See attached scanned questionnaire for additional information.   Signed,   Tyson Dense, RN Nurse Health Advisor  Patient Concerns: None

## 2017-09-02 ENCOUNTER — Encounter: Payer: Self-pay | Admitting: Internal Medicine

## 2017-09-02 ENCOUNTER — Non-Acute Institutional Stay (SKILLED_NURSING_FACILITY): Payer: Medicare Other | Admitting: Internal Medicine

## 2017-09-02 DIAGNOSIS — K5901 Slow transit constipation: Secondary | ICD-10-CM | POA: Diagnosis not present

## 2017-09-02 DIAGNOSIS — J449 Chronic obstructive pulmonary disease, unspecified: Secondary | ICD-10-CM

## 2017-09-02 DIAGNOSIS — D638 Anemia in other chronic diseases classified elsewhere: Secondary | ICD-10-CM

## 2017-09-02 NOTE — Progress Notes (Signed)
Location:  [facility] Room Number: 086P Place of Service:  SNF [phone])  Taylor Roth. Taylor Coil, MD  Patient Care Team: Taylor Duos, MD as PCP - General (Internal Medicine) [name], MD (Gastroenterology) [name], NP (Obstetrics and Gynecology)  Extended Emergency Contact Information Primary Emergency Contact: Taylor Roth Address: [place], [place] Phone: [phone] Mobile Phone: [phone] Relation: Daughter Secondary Emergency Contact: Taylor Roth [place] Phone: [phone] Mobile Phone: [phone] Relation: Daughter    Allergies: Amoxicillin; Ampicillin; Depakote [valproic acid]; Macrobid [nitrofurantoin macrocrystal]; Penicillins; Azithromycin; Doxycycline; Escitalopram oxalate; Fioricet [butalbital-apap-caffeine]; Latex; Levofloxacin; Nsaids; Oxycodone; Restasis [cyclosporine]; Sulfa antibiotics; and Biaxin [clarithromycin]  Chief Complaint  Patient presents with  . Medical Management of Chronic Issues    Routine Visit    HPI: [name] who is being seen for routine issues of COPD, chronic anemia, and constipation.  Past Medical History:  Diagnosis Date  . Anxiety   . Arterial tortuosity (aorta) 12/11/2012  . Asthma   . Chronic kidney disease, stage IV (severe) ([place]) 12/11/2012  . Chronic mental illness   . CKD (chronic kidney disease), stage III ([place]) 01/14/2015  . Closed hip fracture requiring operative repair, right, with routine healing, subsequent encounter 12/19/2015  . Colitis   . COPD (chronic obstructive pulmonary disease) ([place])   . Decreased rectal sphincter tone 11/21/2012  . Depression   . Drug-seeking behavior 02/26/2011   Use of multiple md to obtain benzos/narcotics pt must sign contract to receive controlled meds.   Marland Kitchen Dysphagia 01/11/2015  . GERD (gastroesophageal reflux disease)   . Glaucoma  06/26/2011  . Glaucoma of both eyes   . Hearing loss, sensorineural 03/04/2014   [facility]; 02/26/14. There is not a medical or surgical treatment that would benefit her type of hearing loss. We discussed amplification in an overview fashion.    Marland Kitchen History of fall   . Insomnia, unspecified   . Macular degeneration 06/26/2011  . Migraines   . Nasal bone fracture 03/30/2014  . Osteoporosis   . Osteoporosis 02/26/2011  . Presence of right artificial hip joint   . Protein-calorie malnutrition, severe ([place]) 12/10/2012  . S/P ORIF (open reduction internal fixation) fracture 01/08/2016  . Schatzki's ring 01/14/2015  . Scoliosis   . Substance abuse ([place])    Benbzos and Narcotics, she does not get any of those medicines from [facility], we have empathetically told her that.  . Vertigo   . Vitamin B deficiency   . Vitamin D deficiency 08/25/2016  . Weight loss     Past Surgical History:  Procedure Laterality Date  . ABDOMINAL HYSTERECTOMY    . CATARACT EXTRACTION    . CHOLECYSTECTOMY    . ESOPHAGOGASTRODUODENOSCOPY (EGD) WITH PROPOFOL N/A 01/13/2015   Procedure: ESOPHAGOGASTRODUODENOSCOPY (EGD) WITH PROPOFOL;  Surgeon: [name], MD;  Location: [facility];  Service: Endoscopy;  Laterality: N/A;  . HIP ARTHROPLASTY Right 12/21/2015   Procedure: ARTHROPLASTY BIPOLAR HIP (HEMIARTHROPLASTY);  Surgeon: [name], MD;  Location: [facility];  Service: Orthopedics;  Laterality: Right;  . PARTIAL HYSTERECTOMY      Allergies as of 09/02/2017      Reactions   Amoxicillin    Per daughter   Ampicillin    Per daughter   Depakote [valproic Acid]    DOES NOT WANT HER TO TAKE IT==Makes her hair fall out=  per daughter who is poa   Macrobid [nitrofurantoin Macrocrystal] Rash   Per MAR   Penicillins    Per daughter   Azithromycin Other (See Comments)   Per MAR   Doxycycline Other (See Comments)   Per MAR   Escitalopram Oxalate Other (See Comments)   Per MAR   Fioricet  [butalbital-apap-caffeine] Other (See Comments)   Drunk. Per MAR   Latex Other (See Comments)   Per MAR   Levofloxacin Other (See Comments)   Per MAR   Nsaids Other (See Comments)   This is not an allergy. The patient was told by Dr. Rockne Menghini to avoid NSAIDs and stop Vicoprofen in to 2014 to avoid nephrotoxicity. Per MAR.   Oxycodone Other (See Comments)   reaction to synthetic codeine Per MAR   Restasis [cyclosporine] Other (See Comments)   Per MAR   Sulfa Antibiotics Other (See Comments)   Per MAR   Biaxin [clarithromycin] Rash   With burning sensation Per [facility]      Medication List        Accurate as of 09/02/17 11:59 PM. Always use your most recent med list.          acetaminophen 325 MG tablet Commonly known as:  TYLENOL Take 650 mg by mouth every 6 (six) hours as needed for headache.   albuterol 108 (90 Base) MCG/ACT inhaler Commonly known as:  PROVENTIL HFA;VENTOLIN HFA Inhale 1 puff into the lungs every 6 (six) hours as needed for shortness of breath.   Biotin 5000 MCG Caps Take 1 capsule by mouth daily.   clonazePAM 0.5 MG tablet Commonly known as:  KLONOPIN Take 0.25 mg by mouth 2 (two) times daily.   Cranberry 425 MG Caps Take 425 mg by mouth 2 (two) times daily.   docusate sodium 100 MG capsule Commonly known as:  COLACE Take 100 mg by mouth 2 (two) times daily.   dorzolamide-timolol 22.3-6.8 MG/ML ophthalmic solution Commonly known as:  COSOPT Place 1 drop into both eyes 2 (two) times daily.   ENSURE Take 237 mLs by mouth 4 (four) times daily. D/t weight loss   ferrous sulfate 325 (65 FE) MG tablet Take 325 mg by mouth daily.   fludrocortisone 0.1 MG tablet Commonly known as:  FLORINEF Take 0.1 mg by mouth every morning. for orthostatic hypotension   HYDROcodone-acetaminophen 5-325 MG tablet Commonly known as:  NORCO/VICODIN Take 1 tablet by mouth 3 (three) times daily. At 9 am , 1 pm , and 9 pm for pain   lactulose 10 GM/15ML  solution Commonly known as:  CHRONULAC Take 15 mLs (10 g total) by mouth daily as needed for severe constipation.   magnesium hydroxide 400 MG/5ML suspension Commonly known as:  MILK OF MAGNESIA Take 30 mLs by mouth daily as needed for mild constipation.   mirtazapine 15 MG tablet Commonly known as:  REMERON Take 7.5 mg by mouth at bedtime.   nitroGLYCERIN 0.4 MG SL tablet Commonly known as:  NITROSTAT Place 0.4 mg under the tongue every 5 (five) minutes as needed for chest pain. May use 3 times   polyethylene glycol packet Commonly known as:  MIRALAX / GLYCOLAX Take 17 g by mouth daily.   QUEtiapine 300 MG tablet Commonly known as:  SEROQUEL Take 300 mg by mouth at bedtime. Give with a 50 mg tablet to = 350 mg   SEROQUEL XR 50 MG Tb24 24 hr tablet Generic drug:  QUEtiapine Take 50 mg by mouth at bedtime. Give with  a 300 mg tablet to = 350 mg   senna 8.6 MG tablet Commonly known as:  SENOKOT Take 2 tablets by mouth 2 (two) times daily as needed for constipation.   travoprost (benzalkonium) 0.004 % ophthalmic solution Commonly known as:  TRAVATAN Place 1 drop into both eyes at bedtime.   vitamin B-12 1000 MCG tablet Commonly known as:  CYANOCOBALAMIN Take 1,000 mcg by mouth daily.   Vitamin D 1000 units capsule Take 1,000 Units by mouth daily with breakfast.       Meds ordered this encounter  Medications  . HYDROcodone-acetaminophen (NORCO/VICODIN) 5-325 MG tablet    Sig: Take 1 tablet by mouth 3 (three) times daily. At 9 am , 1 pm , and 9 pm for pain    Dispense:  30 tablet    Refill:  0    Sent to facility pharmacy    Immunization History  Administered Date(s) Administered  . Influenza Split 11/13/2011  . Influenza-Unspecified 02/13/2008, 11/30/2013, 12/20/2016  . PPD Test 12/15/2015  . Pneumococcal-Unspecified 02/13/2008, 10/16/2016  . Td 02/12/2009    Social History   Tobacco Use  . Smoking status: Former Smoker    Packs/day: 0.50    Types:  Cigarettes  . Smokeless tobacco: Never Used  Substance Use Topics  . Alcohol use: No    Review of Systems  DATA OBTAINED: from patient-limited; nursing-no acute concerns GENERAL:  no fevers, fatigue, appetite changes SKIN: No itching, rash HEENT: No complaint RESPIRATORY: No cough, wheezing, SOB CARDIAC: No chest pain, palpitations, lower extremity edema  GI: No abdominal pain, No N/V/D or constipation, No heartburn or reflux  GU: No dysuria, frequency or urgency, or incontinence  MUSCULOSKELETAL: No unrelieved bone/joint pain NEUROLOGIC: No headache, dizziness  PSYCHIATRIC: No overt anxiety or sadness  Vitals:   09/02/17 1628  BP: (!) 108/54  Pulse: (!) 57  Resp: 18  Temp: (!) 97.4 F (36.3 C)   Body mass index is 16.56 kg/m. Physical Exam  GENERAL APPEARANCE: Alert, conversant, No acute distress  SKIN: No diaphoresis rash HEENT: Unremarkable RESPIRATORY: Breathing is even, unlabored. Lung sounds are clear   CARDIOVASCULAR: Heart RRR no murmurs, rubs or gallops. No peripheral edema  GASTROINTESTINAL: Abdomen is soft, non-tender, not distended w/ normal bowel sounds.  GENITOURINARY: Bladder non tender, not distended  MUSCULOSKELETAL: No abnormal joints or musculature except very thin NEUROLOGIC: Cranial nerves 2-12 grossly intact. Moves all extremities PSYCHIATRIC: Mood and affect dementia, no behavioral issues  Patient Active Problem List   Diagnosis Date Noted  . Abnormal EKG 01/24/2017  . Delusions ([place])   . Paranoia ([place])   . Vitamin D deficiency 08/25/2016  . Dementia without behavioral disturbance 03/25/2016  . S/P ORIF (open reduction internal fixation) fracture 01/08/2016  . Oral thrush 01/08/2016  . HTN (hypertension) 01/08/2016  . Closed hip fracture requiring operative repair, right, with routine healing, subsequent encounter 12/19/2015  . Bipolar disease, chronic ([place]) 12/17/2015  . Decubitus ulcer of hip, stage 3 ([place]) 12/11/2015  . Legally blind  08/16/2015  . Ankle pain   . Encephalopathy   . Malnutrition of moderate degree 01/29/2015  . Altered mental status 01/26/2015  . Nausea and vomiting 01/17/2015  . Esophageal dysmotilities   . Urinary tract infectious disease   . Weakness 01/14/2015  . Schatzki's ring 01/14/2015  . CKD (chronic kidney disease), stage III ([place]) 01/14/2015  . Esophagus disorder   . Dysphagia 01/11/2015  . UTI (lower urinary tract infection) 03/30/2014  . Hearing loss, sensorineural 03/04/2014  .  Nasal lesion 03/04/2014  . Constipation 12/12/2012  . Hypokalemia 12/11/2012  . Orthostatic hypotension 12/11/2012  . Arterial tortuosity (aorta) 12/11/2012  . Protein-calorie malnutrition, severe ([place]) 12/10/2012  . Adult failure to thrive 12/09/2012  . Decreased rectal sphincter tone 11/21/2012  . Primary open angle glaucoma of both eyes, severe stage 11/11/2012  . Idiopathic scoliosis 07/08/2012  . Macular atrophy, retinal 06/26/2011  . Vision disturbance 06/26/2011  . Glaucoma 06/26/2011  . Nonexudative senile macular degeneration of retina 06/21/2011  . Macula scar of posterior pole 06/21/2011  . Status post intraocular lens implant 06/21/2011  . Anemia of chronic disease 04/17/2011  . Drug-seeking behavior 02/26/2011  . Osteoporosis 02/26/2011  . Depression   . Anxiety   . COPD (chronic obstructive pulmonary disease) ([place])   . GERD (gastroesophageal reflux disease)   . Migraines   . Insomnia, unspecified     CMP     Component Value Date/Time   NA 140 07/31/2017   K 5.0 07/31/2017   CL 109 12/12/2016 1428   CO2 22 12/12/2016 1428   GLUCOSE 90 12/12/2016 1428   BUN 23 (A) 07/31/2017   CREATININE 0.62 07/31/2017   CALCIUM 8.9 07/31/2017   PROT 5.8 07/31/2017   ALBUMIN 3.5 07/31/2017   AST 14 07/31/2017   ALT 5 07/31/2017   ALKPHOS 87 07/31/2017   BILITOT 0.3 07/31/2017   GFRNONAA 81.51 07/31/2017   GFRNONAA 64 11/09/2013 1135   GFRAA >60 12/12/2016 1428   GFRAA 74 11/09/2013 1135    Recent Labs    12/12/16 1428 02/22/17 07/31/17  NA 142 143 140  K 3.6 4.4 5.0  CL 109  --   --   CO2 22  --   --   GLUCOSE 90  --   --   BUN 14 17 23*  CREATININE 0.67 0.6 0.62  CALCIUM 9.3  --  8.9   Recent Labs    12/12/16 1428 02/22/17 07/31/17  AST 20 16 14   ALT 16 9 5   ALKPHOS 85 117 87  BILITOT 0.7  --  0.3  PROT 6.5  --  5.8  ALBUMIN 3.4*  --  3.5   Recent Labs    12/12/16 1428 02/22/17 03/19/17 07/31/17  WBC 7.8 4.8 7.8 5.7  NEUTROABS 4.6  --   --   --   HGB 12.8 13.2 15.6 12.4  HCT 39.9 40 48.6 37.3  MCV 80.4  --   --  87.2  PLT 239 218  --   --    Recent Labs    07/31/17  CHOL 134  LDLCALC 61  TRIG 102   No results found for: [facility] Lab Results  Component Value Date   TSH 0.59 07/31/2017   Lab Results  Component Value Date   HGBA1C 5.4 06/11/2016   Lab Results  Component Value Date   CHOL 134 07/31/2017   HDL 54 06/11/2016   LDLCALC 61 07/31/2017   TRIG 102 07/31/2017    Significant Diagnostic Results in last 30 days:  No results found.  Assessment and Plan  COPD (chronic obstructive pulmonary disease) ([place]) Remains without exacerbation; continue PRN albuterol  Anemia of chronic disease Most recent hemoglobin 12.4 which is down from prior but more consistent with her baseline hemoglobin; plan to continue B12 1000 mcg daily and iron 325 mg daily  Constipation Controlled; continue Colace 100 mg twice daily; Chronulac 10 g daily as needed, lactulose 17 g daily and senna, twice daily as needed  Taylor Roth. Taylor Coil, MD

## 2017-09-10 DIAGNOSIS — F39 Unspecified mood [affective] disorder: Secondary | ICD-10-CM | POA: Diagnosis not present

## 2017-09-10 DIAGNOSIS — F411 Generalized anxiety disorder: Secondary | ICD-10-CM | POA: Diagnosis not present

## 2017-09-10 DIAGNOSIS — F0390 Unspecified dementia without behavioral disturbance: Secondary | ICD-10-CM | POA: Diagnosis not present

## 2017-09-10 DIAGNOSIS — R440 Auditory hallucinations: Secondary | ICD-10-CM | POA: Diagnosis not present

## 2017-09-12 MED ORDER — HYDROCODONE-ACETAMINOPHEN 5-325 MG PO TABS: 1.0000 | ORAL_TABLET | Freq: Three times a day (TID) | ORAL | 0 refills | Status: DC

## 2017-09-19 ENCOUNTER — Encounter: Payer: Self-pay | Admitting: Internal Medicine

## 2017-09-19 NOTE — Assessment & Plan Note (Signed)
Most recent hemoglobin 12.4 which is down from prior but more consistent with her baseline hemoglobin; plan to continue B12 1000 mcg daily and iron 325 mg daily

## 2017-09-19 NOTE — Assessment & Plan Note (Signed)
Remains without exacerbation; continue PRN albuterol

## 2017-09-19 NOTE — Assessment & Plan Note (Signed)
Controlled; continue Colace 100 mg twice daily; Chronulac 10 g daily as needed, lactulose 17 g daily and senna, twice daily as needed

## 2017-10-02 ENCOUNTER — Non-Acute Institutional Stay (SKILLED_NURSING_FACILITY): Payer: Medicare Other | Admitting: Internal Medicine

## 2017-10-02 ENCOUNTER — Encounter: Payer: Self-pay | Admitting: Internal Medicine

## 2017-10-02 DIAGNOSIS — B001 Herpesviral vesicular dermatitis: Secondary | ICD-10-CM

## 2017-10-02 NOTE — Progress Notes (Signed)
Location:  [facility] Room Number: 829F Place of Service:  SNF [phone])  Taylor Delaine. [name], MD  Patient Care Team: [name], MD as PCP - General (Internal Medicine) [name], MD (Gastroenterology) [name], NP (Obstetrics and Gynecology)  Extended Emergency Contact Information Primary Emergency Contact: [name],[name] Address: [place], [place] Phone: [phone] Mobile Phone: [phone] Relation: Daughter Secondary Emergency Contact: [name] Phone: [phone] Mobile Phone: [phone] Relation: Daughter    Allergies: Amoxicillin; Ampicillin; Depakote [valproic acid]; Macrobid [nitrofurantoin macrocrystal]; Penicillins; Azithromycin; Doxycycline; Escitalopram oxalate; Fioricet [butalbital-apap-caffeine]; Latex; Levofloxacin; Nsaids; Oxycodone; Restasis [cyclosporine]; Sulfa antibiotics; and Biaxin [clarithromycin]  Chief Complaint  Patient presents with  . Acute Visit    HPI: [name] who is being seen today for complaint of blisters around mouth.  Per nursing they do not see any blisters, they just see redness where patient is always rubbing her mouth.  Patient has not changed and what she can eat or drink.  Past Medical History:  Diagnosis Date  . Anxiety   . Arterial tortuosity (aorta) 12/11/2012  . Asthma   . Chronic kidney disease, stage IV (severe) ([place]) 12/11/2012  . Chronic mental illness   . CKD (chronic kidney disease), stage III ([place]) 01/14/2015  . Closed hip fracture requiring operative repair, right, with routine healing, subsequent encounter 12/19/2015  . Colitis   . COPD (chronic obstructive pulmonary disease) ([place])   . Decreased rectal sphincter tone 11/21/2012  . Depression   . Drug-seeking behavior 02/26/2011   Use of multiple md to obtain benzos/narcotics pt must sign contract to  receive controlled meds.   Marland Kitchen Dysphagia 01/11/2015  . GERD (gastroesophageal reflux disease)   . Glaucoma 06/26/2011  . Glaucoma of both eyes   . Hearing loss, sensorineural 03/04/2014   [facility] ENT; 02/26/14. There is not a medical or surgical treatment that would benefit her type of hearing loss. We discussed amplification in an overview fashion.    Marland Kitchen History of fall   . Insomnia, unspecified   . Macular degeneration 06/26/2011  . Migraines   . Nasal bone fracture 03/30/2014  . Osteoporosis   . Osteoporosis 02/26/2011  . Presence of right artificial hip joint   . Protein-calorie malnutrition, severe ([place]) 12/10/2012  . S/P ORIF (open reduction internal fixation) fracture 01/08/2016  . Schatzki's ring 01/14/2015  . Scoliosis   . Substance abuse ([place])    Benbzos and Narcotics, she does not get any of those medicines from [facility], we have empathetically told her that.  . Vertigo   . Vitamin B deficiency   . Vitamin D deficiency 08/25/2016  . Weight loss     Past Surgical History:  Procedure Laterality Date  . ABDOMINAL HYSTERECTOMY    . CATARACT EXTRACTION    . CHOLECYSTECTOMY    . ESOPHAGOGASTRODUODENOSCOPY (EGD) WITH PROPOFOL N/A 01/13/2015   Procedure: ESOPHAGOGASTRODUODENOSCOPY (EGD) WITH PROPOFOL;  Surgeon: [name], MD;  Location: [facility] ENDOSCOPY;  Service: Endoscopy;  Laterality: N/A;  . HIP ARTHROPLASTY Right 12/21/2015   Procedure: ARTHROPLASTY BIPOLAR HIP (HEMIARTHROPLASTY);  Surgeon: [name], MD;  Location: [facility];  Service: Orthopedics;  Laterality: Right;  . PARTIAL HYSTERECTOMY      Allergies as of 10/02/2017      Reactions   Amoxicillin    Per daughter   Ampicillin  Per daughter   Depakote [valproic Acid]    DOES NOT WANT HER TO TAKE IT==Makes her hair fall out= per daughter who is poa   Macrobid [nitrofurantoin Macrocrystal] Rash   Per MAR   Penicillins    Per daughter   Azithromycin Other (See Comments)   Per MAR   Doxycycline Other (See  Comments)   Per MAR   Escitalopram Oxalate Other (See Comments)   Per MAR   Fioricet [butalbital-apap-caffeine] Other (See Comments)   Drunk. Per MAR   Latex Other (See Comments)   Per MAR   Levofloxacin Other (See Comments)   Per MAR   Nsaids Other (See Comments)   This is not an allergy. The patient was told by Dr. Rockne Menghini to avoid NSAIDs and stop Vicoprofen in to 2014 to avoid nephrotoxicity. Per MAR.   Oxycodone Other (See Comments)   reaction to synthetic codeine Per MAR   Restasis [cyclosporine] Other (See Comments)   Per MAR   Sulfa Antibiotics Other (See Comments)   Per MAR   Biaxin [clarithromycin] Rash   With burning sensation Per [facility]      Medication List        Accurate as of 10/02/17 11:59 PM. Always use your most recent med list.          acetaminophen 325 MG tablet Commonly known as:  TYLENOL Take 650 mg by mouth every 6 (six) hours as needed for headache.   albuterol 108 (90 Base) MCG/ACT inhaler Commonly known as:  PROVENTIL HFA;VENTOLIN HFA Inhale 1 puff into the lungs every 6 (six) hours as needed for shortness of breath.   Biotin 5000 MCG Caps Take 1 capsule by mouth daily.   clonazePAM 0.5 MG tablet Commonly known as:  KLONOPIN Take 0.25 mg by mouth 2 (two) times daily.   Cranberry 425 MG Caps Take 425 mg by mouth 2 (two) times daily.   docusate sodium 100 MG capsule Commonly known as:  COLACE Take 100 mg by mouth 2 (two) times daily.   dorzolamide-timolol 22.3-6.8 MG/ML ophthalmic solution Commonly known as:  COSOPT Place 1 drop into both eyes 2 (two) times daily.   ENSURE Take 237 mLs by mouth 4 (four) times daily. D/t weight loss   ferrous sulfate 325 (65 FE) MG tablet Take 325 mg by mouth daily.   fludrocortisone 0.1 MG tablet Commonly known as:  FLORINEF Take 0.1 mg by mouth every morning. for orthostatic hypotension   HYDROcodone-acetaminophen 5-325 MG tablet Commonly known as:  NORCO/VICODIN Take 1 tablet by mouth 3  (three) times daily. At 9 am , 1 pm , and 9 pm for pain   lactulose 10 GM/15ML solution Commonly known as:  CHRONULAC Take 15 mLs (10 g total) by mouth daily as needed for severe constipation.   magnesium hydroxide 400 MG/5ML suspension Commonly known as:  MILK OF MAGNESIA Take 30 mLs by mouth daily as needed for mild constipation.   mirtazapine 15 MG tablet Commonly known as:  REMERON Take 7.5 mg by mouth at bedtime.   nitroGLYCERIN 0.4 MG SL tablet Commonly known as:  NITROSTAT Place 0.4 mg under the tongue every 5 (five) minutes as needed for chest pain. May use 3 times   polyethylene glycol packet Commonly known as:  MIRALAX / GLYCOLAX Take 17 g by mouth daily.   QUEtiapine 300 MG tablet Commonly known as:  SEROQUEL Take 300 mg by mouth at bedtime. Give with a 50 mg tablet to = 350 mg  SEROQUEL XR 50 MG Tb24 24 hr tablet Generic drug:  QUEtiapine Take 50 mg by mouth at bedtime. Give with a 300 mg tablet to = 350 mg   senna 8.6 MG tablet Commonly known as:  SENOKOT Take 2 tablets by mouth 2 (two) times daily as needed for constipation.   travoprost (benzalkonium) 0.004 % ophthalmic solution Commonly known as:  TRAVATAN Place 1 drop into both eyes at bedtime.   vitamin B-12 1000 MCG tablet Commonly known as:  CYANOCOBALAMIN Take 1,000 mcg by mouth daily.   Vitamin D 1000 units capsule Take 1,000 Units by mouth daily with breakfast.       No orders of the defined types were placed in this encounter.   Immunization History  Administered Date(s) Administered  . Influenza Split 11/13/2011  . Influenza-Unspecified 02/13/2008, 11/30/2013, 12/20/2016  . PPD Test 12/15/2015  . Pneumococcal-Unspecified 02/13/2008, 10/16/2016  . Td 02/12/2009    Social History   Tobacco Use  . Smoking status: Former Smoker    Packs/day: 0.50    Types: Cigarettes  . Smokeless tobacco: Never Used  Substance Use Topics  . Alcohol use: No    Review of Systems  DATA  OBTAINED: from patient, nurse, family member GENERAL:  no fevers, fatigue, appetite changes SKIN: Irritation to mouth HEENT: No complaint RESPIRATORY: No cough, wheezing, SOB CARDIAC: No chest pain, palpitations, lower extremity edema  GI: No abdominal pain, No N/V/D or constipation, No heartburn or reflux  GU: No dysuria, frequency or urgency, or incontinence  MUSCULOSKELETAL: No unrelieved bone/joint pain NEUROLOGIC: No headache, dizziness  PSYCHIATRIC: No overt anxiety or sadness  Vitals:   10/02/17 1223  BP: 102/74  Pulse: 74  Resp: 18  Temp: 98.3 F (36.8 C)   Body mass index is 16.84 kg/m. Physical Exam  GENERAL APPEARANCE: Alert, conversant, No acute distress  SKIN: No diaphoresis rash HEENT: Right lower lip tiny small blisters just starting RESPIRATORY: Breathing is even, unlabored. Lung sounds are clear   CARDIOVASCULAR: Heart RRR no murmurs, rubs or gallops. No peripheral edema  GASTROINTESTINAL: Abdomen is soft, non-tender, not distended w/ normal bowel sounds.  GENITOURINARY: Bladder non tender, not distended  MUSCULOSKELETAL: No abnormal joints or musculature except very thin NEUROLOGIC: Cranial nerves 2-12 grossly intact. Moves all extremities PSYCHIATRIC: Mood and affect with dementia, no behavioral issues  Patient Active Problem List   Diagnosis Date Noted  . Abnormal EKG 01/24/2017  . Delusions ([place])   . Paranoia ([place])   . Vitamin D deficiency 08/25/2016  . Dementia without behavioral disturbance 03/25/2016  . S/P ORIF (open reduction internal fixation) fracture 01/08/2016  . Oral thrush 01/08/2016  . HTN (hypertension) 01/08/2016  . Closed hip fracture requiring operative repair, right, with routine healing, subsequent encounter 12/19/2015  . Bipolar disease, chronic ([place]) 12/17/2015  . Decubitus ulcer of hip, stage 3 ([place]) 12/11/2015  . Legally blind 08/16/2015  . Ankle pain   . Encephalopathy   . Malnutrition of moderate degree 01/29/2015  .  Altered mental status 01/26/2015  . Nausea and vomiting 01/17/2015  . Esophageal dysmotilities   . Urinary tract infectious disease   . Weakness 01/14/2015  . Schatzki's ring 01/14/2015  . CKD (chronic kidney disease), stage III ([place]) 01/14/2015  . Esophagus disorder   . Dysphagia 01/11/2015  . UTI (lower urinary tract infection) 03/30/2014  . Hearing loss, sensorineural 03/04/2014  . Nasal lesion 03/04/2014  . Constipation 12/12/2012  . Hypokalemia 12/11/2012  . Orthostatic hypotension 12/11/2012  . Arterial tortuosity (  aorta) 12/11/2012  . Protein-calorie malnutrition, severe ([place]) 12/10/2012  . Adult failure to thrive 12/09/2012  . Decreased rectal sphincter tone 11/21/2012  . Primary open angle glaucoma of both eyes, severe stage 11/11/2012  . Idiopathic scoliosis 07/08/2012  . Macular atrophy, retinal 06/26/2011  . Vision disturbance 06/26/2011  . Glaucoma 06/26/2011  . Nonexudative senile macular degeneration of retina 06/21/2011  . Macula scar of posterior pole 06/21/2011  . Status post intraocular lens implant 06/21/2011  . Anemia of chronic disease 04/17/2011  . Drug-seeking behavior 02/26/2011  . Osteoporosis 02/26/2011  . Depression   . Anxiety   . COPD (chronic obstructive pulmonary disease) ([place])   . GERD (gastroesophageal reflux disease)   . Migraines   . Insomnia, unspecified     CMP     Component Value Date/Time   NA 140 07/31/2017   K 5.0 07/31/2017   CL 109 12/12/2016 1428   CO2 22 12/12/2016 1428   GLUCOSE 90 12/12/2016 1428   BUN 23 (A) 07/31/2017   CREATININE 0.62 07/31/2017   CALCIUM 8.9 07/31/2017   PROT 5.8 07/31/2017   ALBUMIN 3.5 07/31/2017   AST 14 07/31/2017   ALT 5 07/31/2017   ALKPHOS 87 07/31/2017   BILITOT 0.3 07/31/2017   GFRNONAA 81.51 07/31/2017   GFRNONAA 64 11/09/2013 1135   GFRAA >60 12/12/2016 1428   GFRAA 74 11/09/2013 1135   Recent Labs    12/12/16 1428 02/22/17 07/31/17  NA 142 143 140  K 3.6 4.4 5.0  CL 109   --   --   CO2 22  --   --   GLUCOSE 90  --   --   BUN 14 17 23*  CREATININE 0.67 0.6 0.62  CALCIUM 9.3  --  8.9   Recent Labs    12/12/16 1428 02/22/17 07/31/17  AST 20 16 14   ALT 16 9 5   ALKPHOS 85 117 87  BILITOT 0.7  --  0.3  PROT 6.5  --  5.8  ALBUMIN 3.4*  --  3.5   Recent Labs    12/12/16 1428 02/22/17 03/19/17 07/31/17  WBC 7.8 4.8 7.8 5.7  NEUTROABS 4.6  --   --   --   HGB 12.8 13.2 15.6 12.4  HCT 39.9 40 48.6 37.3  MCV 80.4  --   --  87.2  PLT 239 218  --   --    Recent Labs    07/31/17  CHOL 134  LDLCALC 61  TRIG 102   No results found for: [facility] Lab Results  Component Value Date   TSH 0.59 07/31/2017   Lab Results  Component Value Date   HGBA1C 5.4 06/11/2016   Lab Results  Component Value Date   CHOL 134 07/31/2017   HDL 54 06/11/2016   LDLCALC 61 07/31/2017   TRIG 102 07/31/2017    Significant Diagnostic Results in last 30 days:  No results found.  Assessment and Plan  Herpes labialis- Valtrex 2000 mg now and 2000 mg p.o. in 12 hours; monitor response    Webb Silversmith D. [name], MD

## 2017-10-03 ENCOUNTER — Non-Acute Institutional Stay (SKILLED_NURSING_FACILITY): Payer: Medicare Other | Admitting: Internal Medicine

## 2017-10-03 DIAGNOSIS — F0280 Dementia in other diseases classified elsewhere without behavioral disturbance: Secondary | ICD-10-CM

## 2017-10-03 DIAGNOSIS — G301 Alzheimer's disease with late onset: Secondary | ICD-10-CM

## 2017-10-03 DIAGNOSIS — F319 Bipolar disorder, unspecified: Secondary | ICD-10-CM

## 2017-10-03 DIAGNOSIS — H401133 Primary open-angle glaucoma, bilateral, severe stage: Secondary | ICD-10-CM

## 2017-10-05 ENCOUNTER — Encounter: Payer: Self-pay | Admitting: Internal Medicine

## 2017-10-05 NOTE — Progress Notes (Signed)
Location:  [place] of Service:  SNF (31)Skilled nursing facility  Taylor Duos, MD  Patient Care Team: Taylor Duos, MD as PCP - General (Internal Medicine) Taylor Ada, MD (Gastroenterology) Taylor Holter, NP (Obstetrics and Gynecology)  Extended Emergency Contact Information Primary Emergency Contact: Taylor Roth,Taylor Roth Address: [place], [place] Phone: [phone] Mobile Phone: [phone] Relation: Daughter Secondary Emergency Contact: Taylor Roth [place] Phone: [phone] Mobile Phone: [phone] Relation: Daughter    Allergies: Amoxicillin; Ampicillin; Depakote [valproic acid]; Macrobid [nitrofurantoin macrocrystal]; Penicillins; Azithromycin; Doxycycline; Escitalopram oxalate; Fioricet [butalbital-apap-caffeine]; Latex; Levofloxacin; Nsaids; Oxycodone; Restasis [cyclosporine]; Sulfa antibiotics; and Biaxin [clarithromycin]  Chief Complaint  Patient presents with  . Medical Management of Chronic Issues    HPI: [name] who is being seen for routine issues of glaucoma, dementia, and bipolar disease.  Past Medical History:  Diagnosis Date  . Anxiety   . Arterial tortuosity (aorta) 12/11/2012  . Asthma   . Chronic kidney disease, stage IV (severe) ([place]) 12/11/2012  . Chronic mental illness   . CKD (chronic kidney disease), stage III ([place]) 01/14/2015  . Closed hip fracture requiring operative repair, right, with routine healing, subsequent encounter 12/19/2015  . Colitis   . COPD (chronic obstructive pulmonary disease) ([place])   . Decreased rectal sphincter tone 11/21/2012  . Depression   . Drug-seeking behavior 02/26/2011   Use of multiple md to obtain benzos/narcotics pt must sign contract to receive controlled meds.   Marland Kitchen Dysphagia 01/11/2015  . GERD (gastroesophageal reflux disease)   . Glaucoma 06/26/2011  .  Glaucoma of both eyes   . Hearing loss, sensorineural 03/04/2014   Taylor Roth; 02/26/14. There is not a medical or surgical treatment that would benefit her type of hearing loss. We discussed amplification in an overview fashion.    Marland Kitchen History of fall   . Insomnia, unspecified   . Macular degeneration 06/26/2011  . Migraines   . Nasal bone fracture 03/30/2014  . Osteoporosis   . Osteoporosis 02/26/2011  . Presence of right artificial hip joint   . Protein-calorie malnutrition, severe ([place]) 12/10/2012  . S/P ORIF (open reduction internal fixation) fracture 01/08/2016  . Schatzki's ring 01/14/2015  . Scoliosis   . Substance abuse ([place])    Benbzos and Narcotics, she does not get any of those medicines from [facility], we have empathetically told her that.  . Vertigo   . Vitamin B deficiency   . Vitamin D deficiency 08/25/2016  . Weight loss     Past Surgical History:  Procedure Laterality Date  . ABDOMINAL HYSTERECTOMY    . CATARACT EXTRACTION    . CHOLECYSTECTOMY    . ESOPHAGOGASTRODUODENOSCOPY (EGD) WITH PROPOFOL N/A 01/13/2015   Procedure: ESOPHAGOGASTRODUODENOSCOPY (EGD) WITH PROPOFOL;  Surgeon: [name], MD;  Location: [facility];  Service: Endoscopy;  Laterality: N/A;  . HIP ARTHROPLASTY Right 12/21/2015   Procedure: ARTHROPLASTY BIPOLAR HIP (HEMIARTHROPLASTY);  Surgeon: [name], MD;  Location: [facility];  Service: Orthopedics;  Laterality: Right;  . PARTIAL HYSTERECTOMY      Allergies as of 10/03/2017      Reactions   Amoxicillin    Per daughter   Ampicillin    Per daughter   Depakote [valproic Acid]    DOES NOT WANT HER TO TAKE IT==Makes her hair fall out= per daughter who is poa  Macrobid [nitrofurantoin Macrocrystal] Rash   Per MAR   Penicillins    Per daughter   Azithromycin Other (See Comments)   Per MAR   Doxycycline Other (See Comments)   Per MAR   Escitalopram Oxalate Other (See Comments)   Per MAR   Fioricet [butalbital-apap-caffeine] Other (See  Comments)   Drunk. Per MAR   Latex Other (See Comments)   Per MAR   Levofloxacin Other (See Comments)   Per MAR   Nsaids Other (See Comments)   This is not an allergy. The patient was told by Dr. Rockne Roth to avoid NSAIDs and stop Vicoprofen in to 2014 to avoid nephrotoxicity. Per MAR.   Oxycodone Other (See Comments)   reaction to synthetic codeine Per MAR   Restasis [cyclosporine] Other (See Comments)   Per MAR   Sulfa Antibiotics Other (See Comments)   Per MAR   Biaxin [clarithromycin] Rash   With burning sensation Per [facility]      Medication List        Accurate as of 10/03/17 11:59 PM. Always use your most recent med list.          acetaminophen 325 MG tablet Commonly known as:  TYLENOL Take 650 mg by mouth every 6 (six) hours as needed for headache.   albuterol 108 (90 Base) MCG/ACT inhaler Commonly known as:  PROVENTIL HFA;VENTOLIN HFA Inhale 1 puff into the lungs every 6 (six) hours as needed for shortness of breath.   Biotin 5000 MCG Caps Take 1 capsule by mouth daily.   clonazePAM 0.5 MG tablet Commonly known as:  KLONOPIN Take 0.25 mg by mouth 2 (two) times daily.   Cranberry 425 MG Caps Take 425 mg by mouth 2 (two) times daily.   docusate sodium 100 MG capsule Commonly known as:  COLACE Take 100 mg by mouth 2 (two) times daily.   dorzolamide-timolol 22.3-6.8 MG/ML ophthalmic solution Commonly known as:  COSOPT Place 1 drop into both eyes 2 (two) times daily.   ENSURE Take 237 mLs by mouth 4 (four) times daily. D/t weight loss   ferrous sulfate 325 (65 FE) MG tablet Take 325 mg by mouth daily.   fludrocortisone 0.1 MG tablet Commonly known as:  FLORINEF Take 0.1 mg by mouth every morning. for orthostatic hypotension   HYDROcodone-acetaminophen 5-325 MG tablet Commonly known as:  NORCO/VICODIN Take 1 tablet by mouth 3 (three) times daily. At 9 am , 1 pm , and 9 pm for pain   lactulose 10 GM/15ML solution Commonly known as:  CHRONULAC Take 15 mLs  (10 g total) by mouth daily as needed for severe constipation.   magnesium hydroxide 400 MG/5ML suspension Commonly known as:  MILK OF MAGNESIA Take 30 mLs by mouth daily as needed for mild constipation.   mirtazapine 15 MG tablet Commonly known as:  REMERON Take 7.5 mg by mouth at bedtime.   nitroGLYCERIN 0.4 MG SL tablet Commonly known as:  NITROSTAT Place 0.4 mg under the tongue every 5 (five) minutes as needed for chest pain. May use 3 times   polyethylene glycol packet Commonly known as:  MIRALAX / GLYCOLAX Take 17 g by mouth daily.   QUEtiapine 300 MG tablet Commonly known as:  SEROQUEL Take 300 mg by mouth at bedtime. Give with a 50 mg tablet to = 350 mg   SEROQUEL XR 50 MG Tb24 24 hr tablet Generic drug:  QUEtiapine Take 50 mg by mouth at bedtime. Give with a 300 mg tablet to = 350  mg   senna 8.6 MG tablet Commonly known as:  SENOKOT Take 2 tablets by mouth 2 (two) times daily as needed for constipation.   travoprost (benzalkonium) 0.004 % ophthalmic solution Commonly known as:  TRAVATAN Place 1 drop into both eyes at bedtime.   vitamin B-12 1000 MCG tablet Commonly known as:  CYANOCOBALAMIN Take 1,000 mcg by mouth daily.   Vitamin D 1000 units capsule Take 1,000 Units by mouth daily with breakfast.       No orders of the defined types were placed in this encounter.   Immunization History  Administered Date(s) Administered  . Influenza Split 11/13/2011  . Influenza-Unspecified 02/13/2008, 11/30/2013, 12/20/2016  . PPD Test 12/15/2015  . Pneumococcal-Unspecified 02/13/2008, 10/16/2016  . Td 02/12/2009    Social History   Tobacco Use  . Smoking status: Former Smoker    Packs/day: 0.50    Types: Cigarettes  . Smokeless tobacco: Never Used  Substance Use Topics  . Alcohol use: No    Review of Systems  DATA OBTAINED: from patient-very limited; nursing-no acute concerns GENERAL:  no fevers, fatigue, appetite changes SKIN: No itching,  rash HEENT: No complaint RESPIRATORY: No cough, wheezing, SOB CARDIAC: No chest pain, palpitations, lower extremity edema  GI: No abdominal pain, No N/V/D or constipation, No heartburn or reflux  GU: No dysuria, frequency or urgency, or incontinence  MUSCULOSKELETAL: No unrelieved bone/joint pain NEUROLOGIC: No headache, dizziness  PSYCHIATRIC: No overt anxiety or sadness  Vitals:   10/05/17 2048  BP: 102/74  Pulse: 74  Resp: 18  Temp: 98.3 F (36.8 C)   Body mass index is 16.76 kg/m. Physical Exam  GENERAL APPEARANCE: Alert, conversant, No acute distress  SKIN: No diaphoresis rash HEENT: Vesicles have not progressed RESPIRATORY: Breathing is even, unlabored. Lung sounds are clear   CARDIOVASCULAR: Heart RRR no murmurs, rubs or gallops. No peripheral edema  GASTROINTESTINAL: Abdomen is soft, non-tender, not distended w/ normal bowel sounds.  GENITOURINARY: Bladder non tender, not distended  MUSCULOSKELETAL: No abnormal joints or musculature very thin NEUROLOGIC: Cranial nerves 2-12 grossly intact. Moves all extremities PSYCHIATRIC: Mood and affect [place], no behavioral issues  Patient Active Problem List   Diagnosis Date Noted  . Abnormal EKG 01/24/2017  . Delusions ([place])   . Paranoia ([place])   . Vitamin D deficiency 08/25/2016  . Dementia without behavioral disturbance 03/25/2016  . S/P ORIF (open reduction internal fixation) fracture 01/08/2016  . Oral thrush 01/08/2016  . HTN (hypertension) 01/08/2016  . Closed hip fracture requiring operative repair, right, with routine healing, subsequent encounter 12/19/2015  . Bipolar disease, chronic ([place]) 12/17/2015  . Decubitus ulcer of hip, stage 3 ([place]) 12/11/2015  . Legally blind 08/16/2015  . Ankle pain   . Encephalopathy   . Malnutrition of moderate degree 01/29/2015  . Altered mental status 01/26/2015  . Nausea and vomiting 01/17/2015  . Esophageal dysmotilities   . Urinary tract infectious disease   . Weakness  01/14/2015  . Schatzki's ring 01/14/2015  . CKD (chronic kidney disease), stage III ([place]) 01/14/2015  . Esophagus disorder   . Dysphagia 01/11/2015  . UTI (lower urinary tract infection) 03/30/2014  . Hearing loss, sensorineural 03/04/2014  . Nasal lesion 03/04/2014  . Constipation 12/12/2012  . Hypokalemia 12/11/2012  . Orthostatic hypotension 12/11/2012  . Arterial tortuosity (aorta) 12/11/2012  . Protein-calorie malnutrition, severe ([place]) 12/10/2012  . Adult failure to thrive 12/09/2012  . Decreased rectal sphincter tone 11/21/2012  . Primary open angle glaucoma of both eyes, severe  stage 11/11/2012  . Idiopathic scoliosis 07/08/2012  . Macular atrophy, retinal 06/26/2011  . Vision disturbance 06/26/2011  . Glaucoma 06/26/2011  . Nonexudative senile macular degeneration of retina 06/21/2011  . Macula scar of posterior pole 06/21/2011  . Status post intraocular lens implant 06/21/2011  . Anemia of chronic disease 04/17/2011  . Drug-seeking behavior 02/26/2011  . Osteoporosis 02/26/2011  . Depression   . Anxiety   . COPD (chronic obstructive pulmonary disease) ([place])   . GERD (gastroesophageal reflux disease)   . Migraines   . Insomnia, unspecified     CMP     Component Value Date/Time   NA 140 07/31/2017   K 5.0 07/31/2017   CL 109 12/12/2016 1428   CO2 22 12/12/2016 1428   GLUCOSE 90 12/12/2016 1428   BUN 23 (A) 07/31/2017   CREATININE 0.62 07/31/2017   CALCIUM 8.9 07/31/2017   PROT 5.8 07/31/2017   ALBUMIN 3.5 07/31/2017   AST 14 07/31/2017   ALT 5 07/31/2017   ALKPHOS 87 07/31/2017   BILITOT 0.3 07/31/2017   GFRNONAA 81.51 07/31/2017   GFRNONAA 64 11/09/2013 1135   GFRAA >60 12/12/2016 1428   GFRAA 74 11/09/2013 1135   Recent Labs    12/12/16 1428 02/22/17 07/31/17  NA 142 143 140  K 3.6 4.4 5.0  CL 109  --   --   CO2 22  --   --   GLUCOSE 90  --   --   BUN 14 17 23*  CREATININE 0.67 0.6 0.62  CALCIUM 9.3  --  8.9   Recent Labs     12/12/16 1428 02/22/17 07/31/17  AST 20 16 14   ALT 16 9 5   ALKPHOS 85 117 87  BILITOT 0.7  --  0.3  PROT 6.5  --  5.8  ALBUMIN 3.4*  --  3.5   Recent Labs    12/12/16 1428 02/22/17 03/19/17 07/31/17  WBC 7.8 4.8 7.8 5.7  NEUTROABS 4.6  --   --   --   HGB 12.8 13.2 15.6 12.4  HCT 39.9 40 48.6 37.3  MCV 80.4  --   --  87.2  PLT 239 218  --   --    Recent Labs    07/31/17  CHOL 134  LDLCALC 61  TRIG 102   No results found for: [facility] Lab Results  Component Value Date   TSH 0.59 07/31/2017   Lab Results  Component Value Date   HGBA1C 5.4 06/11/2016   Lab Results  Component Value Date   CHOL 134 07/31/2017   HDL 54 06/11/2016   LDLCALC 61 07/31/2017   TRIG 102 07/31/2017    Significant Diagnostic Results in last 30 days:  No results found.  Assessment and Plan  Primary open angle glaucoma of both eyes, severe stage No complaints of pain; continue Cosopt drops 22.3- 6.81 drop twice daily and Travatan 0.004% 1 drop nightly  Dementia without behavioral disturbance Chronic and slowly progressive; continue supportive care  Bipolar disease, chronic (HCC) Chronic and stable; continue Seroquel 350 mg nightly    Inocencio Homes, MD

## 2017-10-05 NOTE — Assessment & Plan Note (Signed)
No complaints of pain; continue Cosopt drops 22.3- 6.81 drop twice daily and Travatan 0.004% 1 drop nightly

## 2017-10-05 NOTE — Assessment & Plan Note (Signed)
Chronic and slowly progressive; continue supportive care

## 2017-10-05 NOTE — Assessment & Plan Note (Signed)
Chronic and stable; continue Seroquel 350 mg nightly

## 2017-10-10 DIAGNOSIS — F411 Generalized anxiety disorder: Secondary | ICD-10-CM | POA: Diagnosis not present

## 2017-10-10 DIAGNOSIS — R440 Auditory hallucinations: Secondary | ICD-10-CM | POA: Diagnosis not present

## 2017-10-10 DIAGNOSIS — F39 Unspecified mood [affective] disorder: Secondary | ICD-10-CM | POA: Diagnosis not present

## 2017-10-10 DIAGNOSIS — F0390 Unspecified dementia without behavioral disturbance: Secondary | ICD-10-CM | POA: Diagnosis not present

## 2017-10-12 DIAGNOSIS — Z79899 Other long term (current) drug therapy: Secondary | ICD-10-CM | POA: Diagnosis not present

## 2017-10-12 DIAGNOSIS — R300 Dysuria: Secondary | ICD-10-CM | POA: Diagnosis not present

## 2017-10-12 DIAGNOSIS — R319 Hematuria, unspecified: Secondary | ICD-10-CM | POA: Diagnosis not present

## 2017-10-12 DIAGNOSIS — N390 Urinary tract infection, site not specified: Secondary | ICD-10-CM | POA: Diagnosis not present

## 2017-10-15 ENCOUNTER — Non-Acute Institutional Stay (SKILLED_NURSING_FACILITY): Payer: Medicare Other | Admitting: Internal Medicine

## 2017-10-15 DIAGNOSIS — B964 Proteus (mirabilis) (morganii) as the cause of diseases classified elsewhere: Secondary | ICD-10-CM | POA: Diagnosis not present

## 2017-10-15 DIAGNOSIS — N390 Urinary tract infection, site not specified: Secondary | ICD-10-CM

## 2017-10-29 ENCOUNTER — Encounter: Payer: Self-pay | Admitting: Internal Medicine

## 2017-10-29 ENCOUNTER — Non-Acute Institutional Stay (SKILLED_NURSING_FACILITY): Payer: Medicare Other | Admitting: Internal Medicine

## 2017-10-29 DIAGNOSIS — H353190 Nonexudative age-related macular degeneration, unspecified eye, stage unspecified: Secondary | ICD-10-CM | POA: Diagnosis not present

## 2017-10-29 DIAGNOSIS — N183 Chronic kidney disease, stage 3 unspecified: Secondary | ICD-10-CM

## 2017-10-29 DIAGNOSIS — I951 Orthostatic hypotension: Secondary | ICD-10-CM

## 2017-10-29 NOTE — Progress Notes (Signed)
ocation:  [facility] Room Number: 382N Place of Service:  SNF (31)  [facility]. [name], MD  Patient Care Team: [name], MD as PCP - General (Internal Medicine) [name], MD (Gastroenterology) [name], NP (Obstetrics and Gynecology)  Extended Emergency Contact Information Primary Emergency Contact: [name],[name] Address: [place], [place] Phone: [phone] Mobile Phone: [phone] Relation: Daughter Secondary Emergency Contact: [name] Phone: [phone] Mobile Phone: [phone] Relation: Daughter    Allergies: Amoxicillin; Ampicillin; Depakote [valproic acid]; Macrobid [nitrofurantoin macrocrystal]; Penicillins; Azithromycin; Doxycycline; Escitalopram oxalate; Fioricet [butalbital-apap-caffeine]; Latex; Levofloxacin; Nsaids; Oxycodone; Restasis [cyclosporine]; Sulfa antibiotics; and Biaxin [clarithromycin]  Chief Complaint  Patient presents with  . Medical Management of Chronic Issues    Routine Visit  . Health Maintenance    PNA and influenza vacc    HPI: [name] who being seen for routine issues of macular degeneration, orthostatic hypotension, and chronic kidney disease stage III.  Past Medical History:  Diagnosis Date  . Anxiety   . Arterial tortuosity (aorta) 12/11/2012  . Asthma   . Chronic kidney disease, stage IV (severe) ([place]) 12/11/2012  . Chronic mental illness   . CKD (chronic kidney disease), stage III ([place]) 01/14/2015  . Closed hip fracture requiring operative repair, right, with routine healing, subsequent encounter 12/19/2015  . Colitis   . COPD (chronic obstructive pulmonary disease) ([place])   . Decreased rectal sphincter tone 11/21/2012  . Depression   . Drug-seeking behavior 02/26/2011   Use of multiple md to obtain benzos/narcotics pt must sign contract to receive  controlled meds.   Marland Kitchen Dysphagia 01/11/2015  . GERD (gastroesophageal reflux disease)   . Glaucoma 06/26/2011  . Glaucoma of both eyes   . Hearing loss, sensorineural 03/04/2014   [facility]; 02/26/14. There is not a medical or surgical treatment that would benefit her type of hearing loss. We discussed amplification in an overview fashion.    Marland Kitchen History of fall   . Insomnia, unspecified   . Macular degeneration 06/26/2011  . Migraines   . Nasal bone fracture 03/30/2014  . Osteoporosis   . Osteoporosis 02/26/2011  . Presence of right artificial hip joint   . Protein-calorie malnutrition, severe ([place]) 12/10/2012  . S/P ORIF (open reduction internal fixation) fracture 01/08/2016  . Schatzki's ring 01/14/2015  . Scoliosis   . Substance abuse ([place])    Benbzos and Narcotics, she does not get any of those medicines from [facility], we have empathetically told her that.  . Vertigo   . Vitamin B deficiency   . Vitamin D deficiency 08/25/2016  . Weight loss     Past Surgical History:  Procedure Laterality Date  . ABDOMINAL HYSTERECTOMY    . CATARACT EXTRACTION    . CHOLECYSTECTOMY    . ESOPHAGOGASTRODUODENOSCOPY (EGD) WITH PROPOFOL N/A 01/13/2015   Procedure: ESOPHAGOGASTRODUODENOSCOPY (EGD) WITH PROPOFOL;  Surgeon: [name], MD;  Location: [facility];  Service: Endoscopy;  Laterality: N/A;  . HIP ARTHROPLASTY Right 12/21/2015   Procedure: ARTHROPLASTY BIPOLAR HIP (HEMIARTHROPLASTY);  Surgeon: [name], MD;  Location: [place];  Service: Orthopedics;  Laterality: Right;  . PARTIAL HYSTERECTOMY      Allergies as of 10/29/2017      Reactions   Amoxicillin    Per daughter   Ampicillin    Per daughter   Depakote [valproic  Acid]    DOES NOT WANT HER TO TAKE IT==Makes her hair fall out= per daughter who is poa   Macrobid [nitrofurantoin Macrocrystal] Rash   Per MAR   Penicillins    Per daughter   Azithromycin Other (See Comments)   Per MAR   Doxycycline Other (See Comments)    Per MAR   Escitalopram Oxalate Other (See Comments)   Per MAR   Fioricet [butalbital-apap-caffeine] Other (See Comments)   Drunk. Per MAR   Latex Other (See Comments)   Per MAR   Levofloxacin Other (See Comments)   Per MAR   Nsaids Other (See Comments)   This is not an allergy. The patient was told by Dr. Rockne Menghini to avoid NSAIDs and stop Vicoprofen in to 2014 to avoid nephrotoxicity. Per MAR.   Oxycodone Other (See Comments)   reaction to synthetic codeine Per MAR   Restasis [cyclosporine] Other (See Comments)   Per MAR   Sulfa Antibiotics Other (See Comments)   Per MAR   Biaxin [clarithromycin] Rash   With burning sensation Per [facility]      Medication List        Accurate as of 10/29/17 11:59 PM. Always use your most recent med list.          acetaminophen 325 MG tablet Commonly known as:  TYLENOL Take 650 mg by mouth every 6 (six) hours as needed for headache.   albuterol 108 (90 Base) MCG/ACT inhaler Commonly known as:  PROVENTIL HFA;VENTOLIN HFA Inhale 1 puff into the lungs every 6 (six) hours as needed for shortness of breath.   Biotin 5000 MCG Caps Take 1 capsule by mouth daily.   CLARITIN 10 MG tablet Generic drug:  loratadine Take 10 mg by mouth daily.   clonazePAM 0.5 MG tablet Commonly known as:  KLONOPIN Take 0.25 mg by mouth 2 (two) times daily.   Cranberry 425 MG Caps Take 425 mg by mouth 2 (two) times daily.   docusate sodium 100 MG capsule Commonly known as:  COLACE Take 100 mg by mouth 2 (two) times daily.   dorzolamide-timolol 22.3-6.8 MG/ML ophthalmic solution Commonly known as:  COSOPT Place 1 drop into both eyes 2 (two) times daily.   ENSURE Take 237 mLs by mouth 4 (four) times daily. D/t weight loss   ferrous sulfate 325 (65 FE) MG tablet Take 325 mg by mouth daily.   fludrocortisone 0.1 MG tablet Commonly known as:  FLORINEF Take 0.1 mg by mouth every morning. for orthostatic hypotension   HYDROcodone-acetaminophen 5-325 MG  tablet Commonly known as:  NORCO/VICODIN Take 1 tablet by mouth 3 (three) times daily. At 9 am , 1 pm , and 9 pm for pain   lactulose 10 GM/15ML solution Commonly known as:  CHRONULAC Take 15 mLs (10 g total) by mouth daily as needed for severe constipation.   magnesium hydroxide 400 MG/5ML suspension Commonly known as:  MILK OF MAGNESIA Take 30 mLs by mouth daily as needed for mild constipation.   mirtazapine 15 MG tablet Commonly known as:  REMERON Take 7.5 mg by mouth at bedtime.   nitroGLYCERIN 0.4 MG SL tablet Commonly known as:  NITROSTAT Place 0.4 mg under the tongue every 5 (five) minutes as needed for chest pain. May use 3 times   polyethylene glycol packet Commonly known as:  MIRALAX / GLYCOLAX Take 17 g by mouth daily.   QUEtiapine 300 MG tablet Commonly known as:  SEROQUEL Take 300 mg by mouth at bedtime. Give with  a 50 mg tablet to = 350 mg   QUEtiapine 100 MG tablet Commonly known as:  SEROQUEL Take 100 mg by mouth at bedtime.   senna 8.6 MG tablet Commonly known as:  SENOKOT Take 2 tablets by mouth 2 (two) times daily as needed for constipation.   SYSTANE 0.4-0.3 % Soln Generic drug:  Polyethyl Glycol-Propyl Glycol Apply to eye.   travoprost (benzalkonium) 0.004 % ophthalmic solution Commonly known as:  TRAVATAN Place 1 drop into both eyes at bedtime.   vitamin B-12 1000 MCG tablet Commonly known as:  CYANOCOBALAMIN Take 1,000 mcg by mouth daily.   Vitamin D 1000 units capsule Take 1,000 Units by mouth daily with breakfast.       No orders of the defined types were placed in this encounter.   Immunization History  Administered Date(s) Administered  . Influenza Split 11/13/2011  . Influenza-Unspecified 02/13/2008, 11/30/2013, 12/20/2016  . PPD Test 12/15/2015  . Pneumococcal-Unspecified 02/13/2008, 10/16/2016  . Td 02/12/2009    Social History   Tobacco Use  . Smoking status: Former Smoker    Packs/day: 0.50    Types: Cigarettes  .  Smokeless tobacco: Never Used  Substance Use Topics  . Alcohol use: No    Review of Systems  DATA OBTAINED: from patient is limited; nursing-no acute concerns GENERAL:  no fevers, fatigue, appetite changes SKIN: No itching, rash HEENT: No complaint RESPIRATORY: No cough, wheezing, SOB CARDIAC: No chest pain, palpitations, lower extremity edema  GI: No abdominal pain, No N/V/D or constipation, No heartburn or reflux  GU: No dysuria, frequency or urgency, or incontinence  MUSCULOSKELETAL: No unrelieved bone/joint pain NEUROLOGIC: No headache, dizziness  PSYCHIATRIC: No overt anxiety or sadness  Vitals:   10/29/17 1217  BP: 118/72  Pulse: 78  Resp: 19  Temp: 98.2 F (36.8 C)  SpO2: 96%   Body mass index is 16.32 kg/m. Physical Exam  GENERAL APPEARANCE: Alert, conversant, No acute distress  SKIN: No diaphoresis rash HEENT: Unremarkable RESPIRATORY: Breathing is even, unlabored. Lung sounds are clear   CARDIOVASCULAR: Heart RRR no murmurs, rubs or gallops. No peripheral edema  GASTROINTESTINAL: Abdomen is soft, non-tender, not distended w/ normal bowel sounds.  GENITOURINARY: Bladder non tender, not distended  MUSCULOSKELETAL: No abnormal joints or musculature except very thin NEUROLOGIC: Cranial nerves 2-12 grossly intact. Moves all extremities PSYCHIATRIC: Mood and affect with dementia, no behavioral issues  Patient Active Problem List   Diagnosis Date Noted  . Abnormal EKG 01/24/2017  . Delusions ([place])   . Paranoia ([place])   . Vitamin D deficiency 08/25/2016  . Dementia without behavioral disturbance 03/25/2016  . S/P ORIF (open reduction internal fixation) fracture 01/08/2016  . Oral thrush 01/08/2016  . HTN (hypertension) 01/08/2016  . Closed hip fracture requiring operative repair, right, with routine healing, subsequent encounter 12/19/2015  . Bipolar disease, chronic ([place]) 12/17/2015  . Decubitus ulcer of hip, stage 3 ([place]) 12/11/2015  . Legally blind  08/16/2015  . Ankle pain   . Encephalopathy   . Malnutrition of moderate degree 01/29/2015  . Altered mental status 01/26/2015  . Nausea and vomiting 01/17/2015  . Esophageal dysmotilities   . Urinary tract infectious disease   . Weakness 01/14/2015  . Schatzki's ring 01/14/2015  . CKD (chronic kidney disease), stage III ([place]) 01/14/2015  . Esophagus disorder   . Dysphagia 01/11/2015  . UTI (lower urinary tract infection) 03/30/2014  . Hearing loss, sensorineural 03/04/2014  . Nasal lesion 03/04/2014  . Constipation 12/12/2012  . Hypokalemia  12/11/2012  . Orthostatic hypotension 12/11/2012  . Arterial tortuosity (aorta) 12/11/2012  . Protein-calorie malnutrition, severe ([place]) 12/10/2012  . Adult failure to thrive 12/09/2012  . Decreased rectal sphincter tone 11/21/2012  . Primary open angle glaucoma of both eyes, severe stage 11/11/2012  . Idiopathic scoliosis 07/08/2012  . Macular atrophy, retinal 06/26/2011  . Vision disturbance 06/26/2011  . Glaucoma 06/26/2011  . Nonexudative senile macular degeneration of retina 06/21/2011  . Macula scar of posterior pole 06/21/2011  . Status post intraocular lens implant 06/21/2011  . Anemia of chronic disease 04/17/2011  . Drug-seeking behavior 02/26/2011  . Osteoporosis 02/26/2011  . Depression   . Anxiety   . COPD (chronic obstructive pulmonary disease) ([place])   . GERD (gastroesophageal reflux disease)   . Migraines   . Insomnia, unspecified     CMP     Component Value Date/Time   NA 140 07/31/2017   K 5.0 07/31/2017   CL 109 12/12/2016 1428   CO2 22 12/12/2016 1428   GLUCOSE 90 12/12/2016 1428   BUN 23 (A) 07/31/2017   CREATININE 0.62 07/31/2017   CALCIUM 8.9 07/31/2017   PROT 5.8 07/31/2017   ALBUMIN 3.5 07/31/2017   AST 14 07/31/2017   ALT 5 07/31/2017   ALKPHOS 87 07/31/2017   BILITOT 0.3 07/31/2017   GFRNONAA 81.51 07/31/2017   GFRNONAA 64 11/09/2013 1135   GFRAA >60 12/12/2016 1428   GFRAA 74 11/09/2013 1135    Recent Labs    12/12/16 1428 02/22/17 07/31/17  NA 142 143 140  K 3.6 4.4 5.0  CL 109  --   --   CO2 22  --   --   GLUCOSE 90  --   --   BUN 14 17 23*  CREATININE 0.67 0.6 0.62  CALCIUM 9.3  --  8.9   Recent Labs    12/12/16 1428 02/22/17 07/31/17  AST 20 16 14   ALT 16 9 5   ALKPHOS 85 117 87  BILITOT 0.7  --  0.3  PROT 6.5  --  5.8  ALBUMIN 3.4*  --  3.5   Recent Labs    12/12/16 1428 02/22/17 03/19/17 07/31/17  WBC 7.8 4.8 7.8 5.7  NEUTROABS 4.6  --   --   --   HGB 12.8 13.2 15.6 12.4  HCT 39.9 40 48.6 37.3  MCV 80.4  --   --  87.2  PLT 239 218  --   --    Recent Labs    07/31/17  CHOL 134  LDLCALC 61  TRIG 102   No results found for: [facility] Lab Results  Component Value Date   TSH 0.59 07/31/2017   Lab Results  Component Value Date   HGBA1C 5.4 06/11/2016   Lab Results  Component Value Date   CHOL 134 07/31/2017   HDL 54 06/11/2016   LDLCALC 61 07/31/2017   TRIG 102 07/31/2017    Significant Diagnostic Results in last 30 days:  No results found.  Assessment and Plan  Nonexudative senile macular degeneration of retina Chronic; untreatable; patient is blind from same  Orthostatic hypotension Sufficient; continue Florinef 0.1 mg every morning  CKD (chronic kidney disease), stage III No recent GFR, creatinine is 0.6 which is stable from prior    Hezikiah Retzloff D. [name], MD

## 2017-10-29 NOTE — Progress Notes (Signed)
Location:   [facility] Service:   SNF  [name], [name], MD  Patient Care Team: [name], MD as PCP - General (Internal Medicine) [name], MD (Gastroenterology) [name], NP (Obstetrics and Gynecology)  Extended Emergency Contact Information Primary Emergency Contact: [name],[name] Address: [place], [place] Phone: [phone] Mobile Phone: [phone] Relation: Daughter Secondary Emergency Contact: [name] Phone: [phone] Mobile Phone: [phone] Relation: Daughter    Allergies: Amoxicillin; Ampicillin; Depakote [valproic acid]; Macrobid [nitrofurantoin macrocrystal]; Penicillins; Azithromycin; Doxycycline; Escitalopram oxalate; Fioricet [butalbital-apap-caffeine]; Latex; Levofloxacin; Nsaids; Oxycodone; Restasis [cyclosporine]; Sulfa antibiotics; and Biaxin [clarithromycin]  Chief Complaint  Patient presents with  . Acute Visit    HPI: [name] who is being seen for pelvic pain for several days which resulted in a urinalysis being sent which grew out today greater than 100,000 Proteus.  No fever, slight mental status change.  Past Medical History:  Diagnosis Date  . Anxiety   . Arterial tortuosity (aorta) 12/11/2012  . Asthma   . Chronic kidney disease, stage IV (severe) ([place]) 12/11/2012  . Chronic mental illness   . CKD (chronic kidney disease), stage III ([place]) 01/14/2015  . Closed hip fracture requiring operative repair, right, with routine healing, subsequent encounter 12/19/2015  . Colitis   . COPD (chronic obstructive pulmonary disease) ([place])   . Decreased rectal sphincter tone 11/21/2012  . Depression   . Drug-seeking behavior 02/26/2011   Use of multiple md to obtain benzos/narcotics pt must sign contract to receive controlled meds.   Marland Kitchen Dysphagia 01/11/2015  . GERD (gastroesophageal reflux disease)   .  Glaucoma 06/26/2011  . Glaucoma of both eyes   . Hearing loss, sensorineural 03/04/2014   [facility]; 02/26/14. There is not a medical or surgical treatment that would benefit her type of hearing loss. We discussed amplification in an overview fashion.    Marland Kitchen History of fall   . Insomnia, unspecified   . Macular degeneration 06/26/2011  . Migraines   . Nasal bone fracture 03/30/2014  . Osteoporosis   . Osteoporosis 02/26/2011  . Presence of right artificial hip joint   . Protein-calorie malnutrition, severe ([place]) 12/10/2012  . S/P ORIF (open reduction internal fixation) fracture 01/08/2016  . Schatzki's ring 01/14/2015  . Scoliosis   . Substance abuse ([place])    Benbzos and Narcotics, she does not get any of those medicines from [facility], we have empathetically told her that.  . Vertigo   . Vitamin B deficiency   . Vitamin D deficiency 08/25/2016  . Weight loss     Past Surgical History:  Procedure Laterality Date  . ABDOMINAL HYSTERECTOMY    . CATARACT EXTRACTION    . CHOLECYSTECTOMY    . ESOPHAGOGASTRODUODENOSCOPY (EGD) WITH PROPOFOL N/A 01/13/2015   Procedure: ESOPHAGOGASTRODUODENOSCOPY (EGD) WITH PROPOFOL;  Surgeon: [name], MD;  Location: [facility];  Service: Endoscopy;  Laterality: N/A;  . HIP ARTHROPLASTY Right 12/21/2015   Procedure: ARTHROPLASTY BIPOLAR HIP (HEMIARTHROPLASTY);  Surgeon: [name], MD;  Location: [facility];  Service: Orthopedics;  Laterality: Right;  . PARTIAL HYSTERECTOMY      Allergies as of 10/15/2017      Reactions   Amoxicillin    Per daughter   Ampicillin    Per daughter   Depakote [valproic Acid]    DOES NOT WANT HER TO TAKE  IT==Makes her hair fall out= per daughter who is poa   Macrobid [nitrofurantoin Macrocrystal] Rash   Per MAR   Penicillins    Per daughter   Azithromycin Other (See Comments)   Per MAR   Doxycycline Other (See Comments)   Per MAR   Escitalopram Oxalate Other (See Comments)   Per MAR   Fioricet  [butalbital-apap-caffeine] Other (See Comments)   Drunk. Per MAR   Latex Other (See Comments)   Per MAR   Levofloxacin Other (See Comments)   Per MAR   Nsaids Other (See Comments)   This is not an allergy. The patient was told by Dr. Rockne Menghini to avoid NSAIDs and stop Vicoprofen in to 2014 to avoid nephrotoxicity. Per MAR.   Oxycodone Other (See Comments)   reaction to synthetic codeine Per MAR   Restasis [cyclosporine] Other (See Comments)   Per MAR   Sulfa Antibiotics Other (See Comments)   Per MAR   Biaxin [clarithromycin] Rash   With burning sensation Per [facility]      Medication List        Accurate as of 10/15/17 11:59 PM. Always use your most recent med list.          acetaminophen 325 MG tablet Commonly known as:  TYLENOL Take 650 mg by mouth every 6 (six) hours as needed for headache.   albuterol 108 (90 Base) MCG/ACT inhaler Commonly known as:  PROVENTIL HFA;VENTOLIN HFA Inhale 1 puff into the lungs every 6 (six) hours as needed for shortness of breath.   Biotin 5000 MCG Caps Take 1 capsule by mouth daily.   clonazePAM 0.5 MG tablet Commonly known as:  KLONOPIN Take 0.25 mg by mouth 2 (two) times daily.   Cranberry 425 MG Caps Take 425 mg by mouth 2 (two) times daily.   docusate sodium 100 MG capsule Commonly known as:  COLACE Take 100 mg by mouth 2 (two) times daily.   dorzolamide-timolol 22.3-6.8 MG/ML ophthalmic solution Commonly known as:  COSOPT Place 1 drop into both eyes 2 (two) times daily.   ENSURE Take 237 mLs by mouth 4 (four) times daily. D/t weight loss   ferrous sulfate 325 (65 FE) MG tablet Take 325 mg by mouth daily.   fludrocortisone 0.1 MG tablet Commonly known as:  FLORINEF Take 0.1 mg by mouth every morning. for orthostatic hypotension   HYDROcodone-acetaminophen 5-325 MG tablet Commonly known as:  NORCO/VICODIN Take 1 tablet by mouth 3 (three) times daily. At 9 am , 1 pm , and 9 pm for pain   lactulose 10 GM/15ML  solution Commonly known as:  CHRONULAC Take 15 mLs (10 g total) by mouth daily as needed for severe constipation.   magnesium hydroxide 400 MG/5ML suspension Commonly known as:  MILK OF MAGNESIA Take 30 mLs by mouth daily as needed for mild constipation.   mirtazapine 15 MG tablet Commonly known as:  REMERON Take 7.5 mg by mouth at bedtime.   nitroGLYCERIN 0.4 MG SL tablet Commonly known as:  NITROSTAT Place 0.4 mg under the tongue every 5 (five) minutes as needed for chest pain. May use 3 times   polyethylene glycol packet Commonly known as:  MIRALAX / GLYCOLAX Take 17 g by mouth daily.   QUEtiapine 300 MG tablet Commonly known as:  SEROQUEL Take 300 mg by mouth at bedtime. Give with a 50 mg tablet to = 350 mg   SEROQUEL XR 50 MG Tb24 24 hr tablet Generic drug:  QUEtiapine Take 50 mg by  mouth at bedtime. Give with a 300 mg tablet to = 350 mg   senna 8.6 MG tablet Commonly known as:  SENOKOT Take 2 tablets by mouth 2 (two) times daily as needed for constipation.   travoprost (benzalkonium) 0.004 % ophthalmic solution Commonly known as:  TRAVATAN Place 1 drop into both eyes at bedtime.   vitamin B-12 1000 MCG tablet Commonly known as:  CYANOCOBALAMIN Take 1,000 mcg by mouth daily.   Vitamin D 1000 units capsule Take 1,000 Units by mouth daily with breakfast.       No orders of the defined types were placed in this encounter.   Immunization History  Administered Date(s) Administered  . Influenza Split 11/13/2011  . Influenza-Unspecified 02/13/2008, 11/30/2013, 12/20/2016  . PPD Test 12/15/2015  . Pneumococcal-Unspecified 02/13/2008, 10/16/2016  . Td 02/12/2009    Social History   Tobacco Use  . Smoking status: Former Smoker    Packs/day: 0.50    Types: Cigarettes  . Smokeless tobacco: Never Used  Substance Use Topics  . Alcohol use: No    Review of Systems  DATA OBTAINED: from patient-limited; nursing- as per history present illness GENERAL:  no  fevers, fatigue, appetite changes SKIN: No itching, rash HEENT: No complaint RESPIRATORY: No cough, wheezing, SOB CARDIAC: No chest pain, palpitations, lower extremity edema  GI: No abdominal pain, No N/V/D or constipation, No heartburn or reflux  GU: No dysuria, frequency or urgency, or incontinence, does have suprapubic pain MUSCULOSKELETAL: No unrelieved bone/joint pain NEUROLOGIC: No headache, dizziness  PSYCHIATRIC: No overt anxiety or sadness  Vitals:   10/29/17 2016  BP: 109/61  Pulse: 83  Resp: 17  Temp: 98.3 F (36.8 C)   There is no height or weight on file to calculate BMI. Physical Exam  GENERAL APPEARANCE: Alert, conversant, No acute distress  SKIN: No diaphoresis rash HEENT: Unremarkable RESPIRATORY: Breathing is even, unlabored. Lung sounds are clear   CARDIOVASCULAR: Heart RRR no murmurs, rubs or gallops. No peripheral edema  GASTROINTESTINAL: Abdomen is soft, non-tender, not distended w/ normal bowel sounds.  GENITOURINARY: Bladder is tender, not distended  MUSCULOSKELETAL: No abnormal joints or musculature NEUROLOGIC: Cranial nerves 2-12 grossly intact. Moves all extremities PSYCHIATRIC: Mood and affect with dementia, no behavioral issues  Patient Active Problem List   Diagnosis Date Noted  . Abnormal EKG 01/24/2017  . Delusions ([place])   . Paranoia ([place])   . Vitamin D deficiency 08/25/2016  . Dementia without behavioral disturbance 03/25/2016  . S/P ORIF (open reduction internal fixation) fracture 01/08/2016  . Oral thrush 01/08/2016  . HTN (hypertension) 01/08/2016  . Closed hip fracture requiring operative repair, right, with routine healing, subsequent encounter 12/19/2015  . Bipolar disease, chronic ([place]) 12/17/2015  . Decubitus ulcer of hip, stage 3 ([place]) 12/11/2015  . Legally blind 08/16/2015  . Ankle pain   . Encephalopathy   . Malnutrition of moderate degree 01/29/2015  . Altered mental status 01/26/2015  . Nausea and vomiting 01/17/2015  .  Esophageal dysmotilities   . Urinary tract infectious disease   . Weakness 01/14/2015  . Schatzki's ring 01/14/2015  . CKD (chronic kidney disease), stage III ([place]) 01/14/2015  . Esophagus disorder   . Dysphagia 01/11/2015  . UTI (lower urinary tract infection) 03/30/2014  . Hearing loss, sensorineural 03/04/2014  . Nasal lesion 03/04/2014  . Constipation 12/12/2012  . Hypokalemia 12/11/2012  . Orthostatic hypotension 12/11/2012  . Arterial tortuosity (aorta) 12/11/2012  . Protein-calorie malnutrition, severe ([place]) 12/10/2012  . Adult failure to thrive  12/09/2012  . Decreased rectal sphincter tone 11/21/2012  . Primary open angle glaucoma of both eyes, severe stage 11/11/2012  . Idiopathic scoliosis 07/08/2012  . Macular atrophy, retinal 06/26/2011  . Vision disturbance 06/26/2011  . Glaucoma 06/26/2011  . Nonexudative senile macular degeneration of retina 06/21/2011  . Macula scar of posterior pole 06/21/2011  . Status post intraocular lens implant 06/21/2011  . Anemia of chronic disease 04/17/2011  . Drug-seeking behavior 02/26/2011  . Osteoporosis 02/26/2011  . Depression   . Anxiety   . COPD (chronic obstructive pulmonary disease) ([place])   . GERD (gastroesophageal reflux disease)   . Migraines   . Insomnia, unspecified     CMP     Component Value Date/Time   NA 140 07/31/2017   K 5.0 07/31/2017   CL 109 12/12/2016 1428   CO2 22 12/12/2016 1428   GLUCOSE 90 12/12/2016 1428   BUN 23 (A) 07/31/2017   CREATININE 0.62 07/31/2017   CALCIUM 8.9 07/31/2017   PROT 5.8 07/31/2017   ALBUMIN 3.5 07/31/2017   AST 14 07/31/2017   ALT 5 07/31/2017   ALKPHOS 87 07/31/2017   BILITOT 0.3 07/31/2017   GFRNONAA 81.51 07/31/2017   GFRNONAA 64 11/09/2013 1135   GFRAA >60 12/12/2016 1428   GFRAA 74 11/09/2013 1135   Recent Labs    12/12/16 1428 02/22/17 07/31/17  NA 142 143 140  K 3.6 4.4 5.0  CL 109  --   --   CO2 22  --   --   GLUCOSE 90  --   --   BUN 14 17 23*    CREATININE 0.67 0.6 0.62  CALCIUM 9.3  --  8.9   Recent Labs    12/12/16 1428 02/22/17 07/31/17  AST 20 16 14   ALT 16 9 5   ALKPHOS 85 117 87  BILITOT 0.7  --  0.3  PROT 6.5  --  5.8  ALBUMIN 3.4*  --  3.5   Recent Labs    12/12/16 1428 02/22/17 03/19/17 07/31/17  WBC 7.8 4.8 7.8 5.7  NEUTROABS 4.6  --   --   --   HGB 12.8 13.2 15.6 12.4  HCT 39.9 40 48.6 37.3  MCV 80.4  --   --  87.2  PLT 239 218  --   --    Recent Labs    07/31/17  CHOL 134  LDLCALC 61  TRIG 102   No results found for: [facility] Lab Results  Component Value Date   TSH 0.59 07/31/2017   Lab Results  Component Value Date   HGBA1C 5.4 06/11/2016   Lab Results  Component Value Date   CHOL 134 07/31/2017   HDL 54 06/11/2016   LDLCALC 61 07/31/2017   TRIG 102 07/31/2017    Significant Diagnostic Results in last 30 days:  No results found.  Assessment and Plan  Proteus UTI-patient with creatinine clearance calculated of 37.57 based on a creatinine of 62 and a weight of 88 pounds; patient is allergic to everything, she has used cephalosporins before we will treat with cefdinir 300 mg every 12 x7 days    Spent greater than 35 minutes;> 50% of time with patient was spent reviewing records, labs, tests and studies, counseling and developing plan of care  Inocencio Homes, MD

## 2017-10-31 IMAGING — CR DG HIP (WITH OR WITHOUT PELVIS) 2-3V*L*
3 series · 3 of 3 positions shown · non-contrast
Comparison: CT scan 01/11/2015.

CLINICAL DATA: Fell tonight.  Left hip pain.

EXAM:
DG HIP (WITH OR WITHOUT PELVIS) 2-3V LEFT

[t pelvis ap]
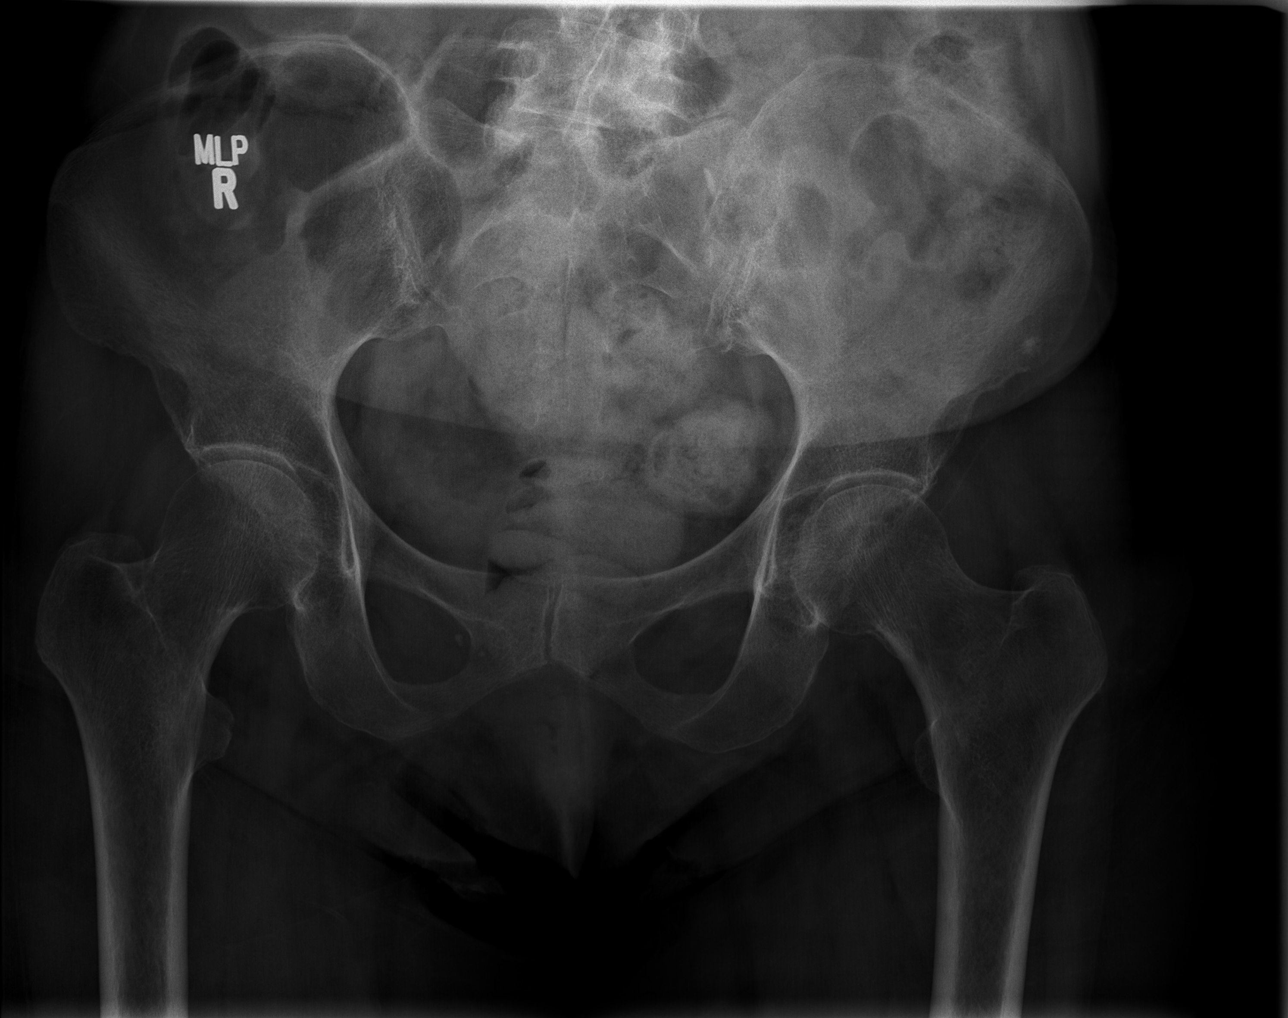

[t hip ap left]
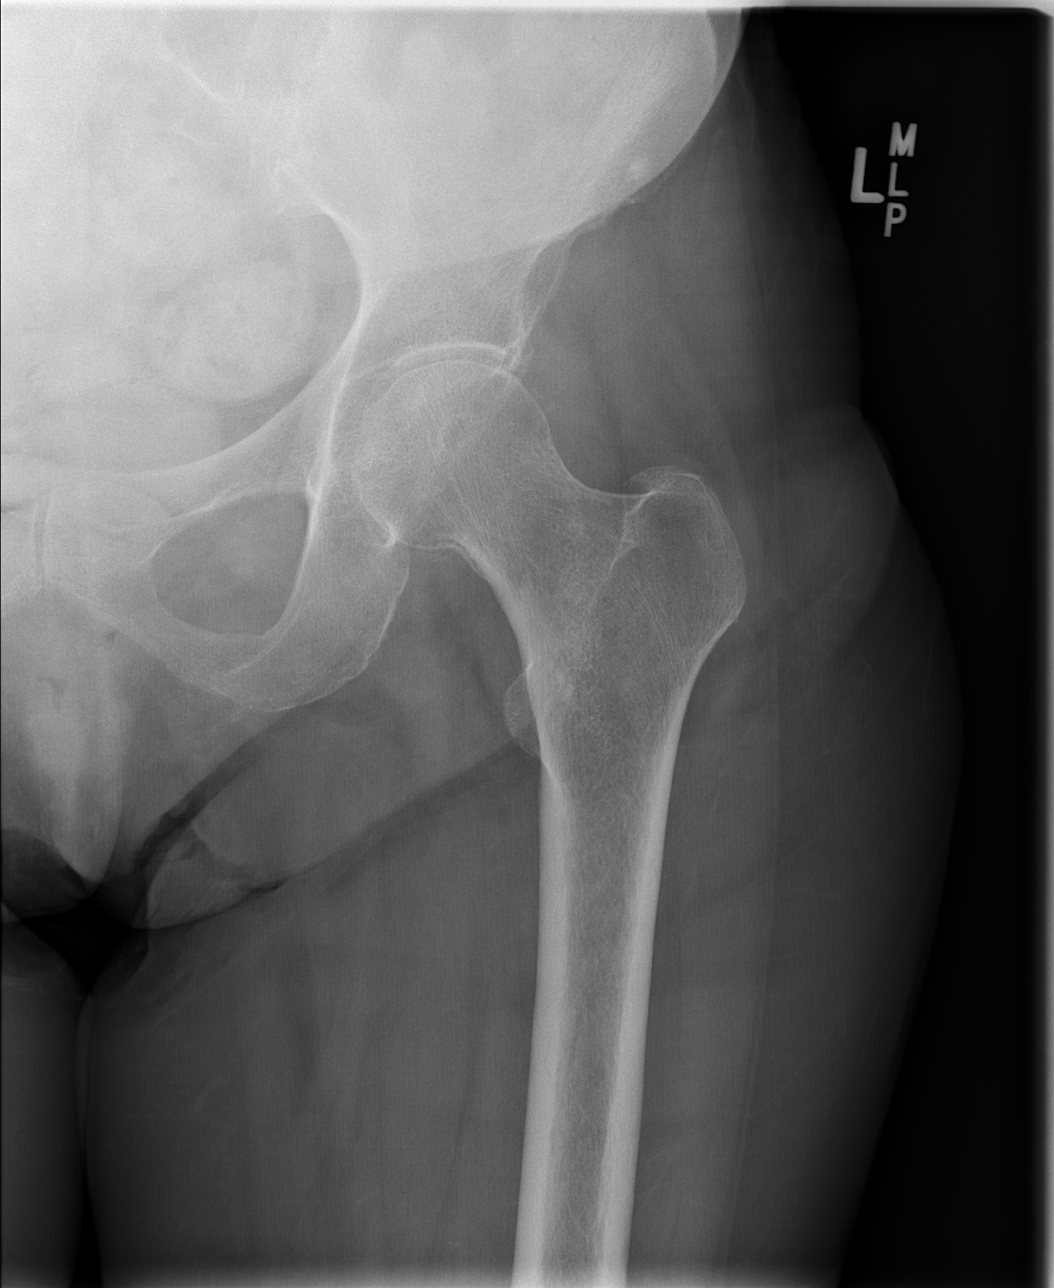

[t hip frog leg left]
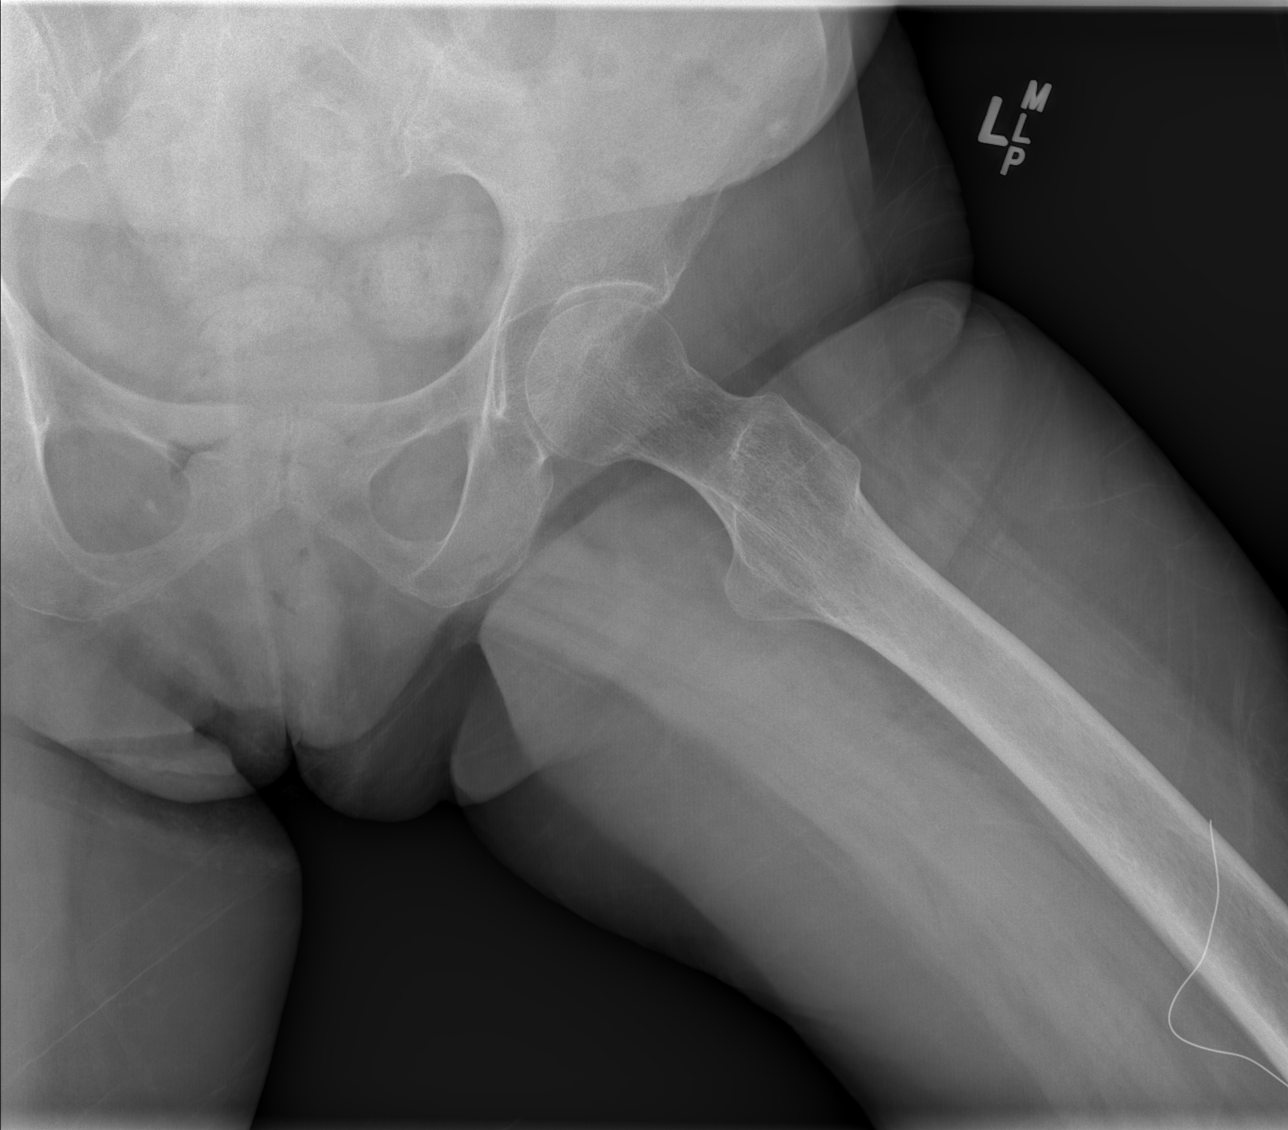

[3 of 3 positions shown; findings below may reference images not displayed]

FINDINGS: Both hips are normally located. Mild degenerative changes
bilaterally. No acute hip fracture is identified. No plain film
findings for avascular necrosis. The pubic symphysis and SI joints
are intact. No pelvic fractures are identified.
IMPRESSION: Mild bilateral hip joint degenerative changes but no acute hip or
pelvic fracture.

## 2017-11-03 IMAGING — DX DG CERVICAL SPINE 2 OR 3 VIEWS
3 series · 3 of 3 positions shown · non-contrast
Comparison: CT of the cervical spine 09/09/2014.

CLINICAL DATA: 86-year-old female with right-sided lower neck pain
for the past 2-3 days.

EXAM:
CERVICAL SPINE - 2-3 VIEW

[c-spine lat]
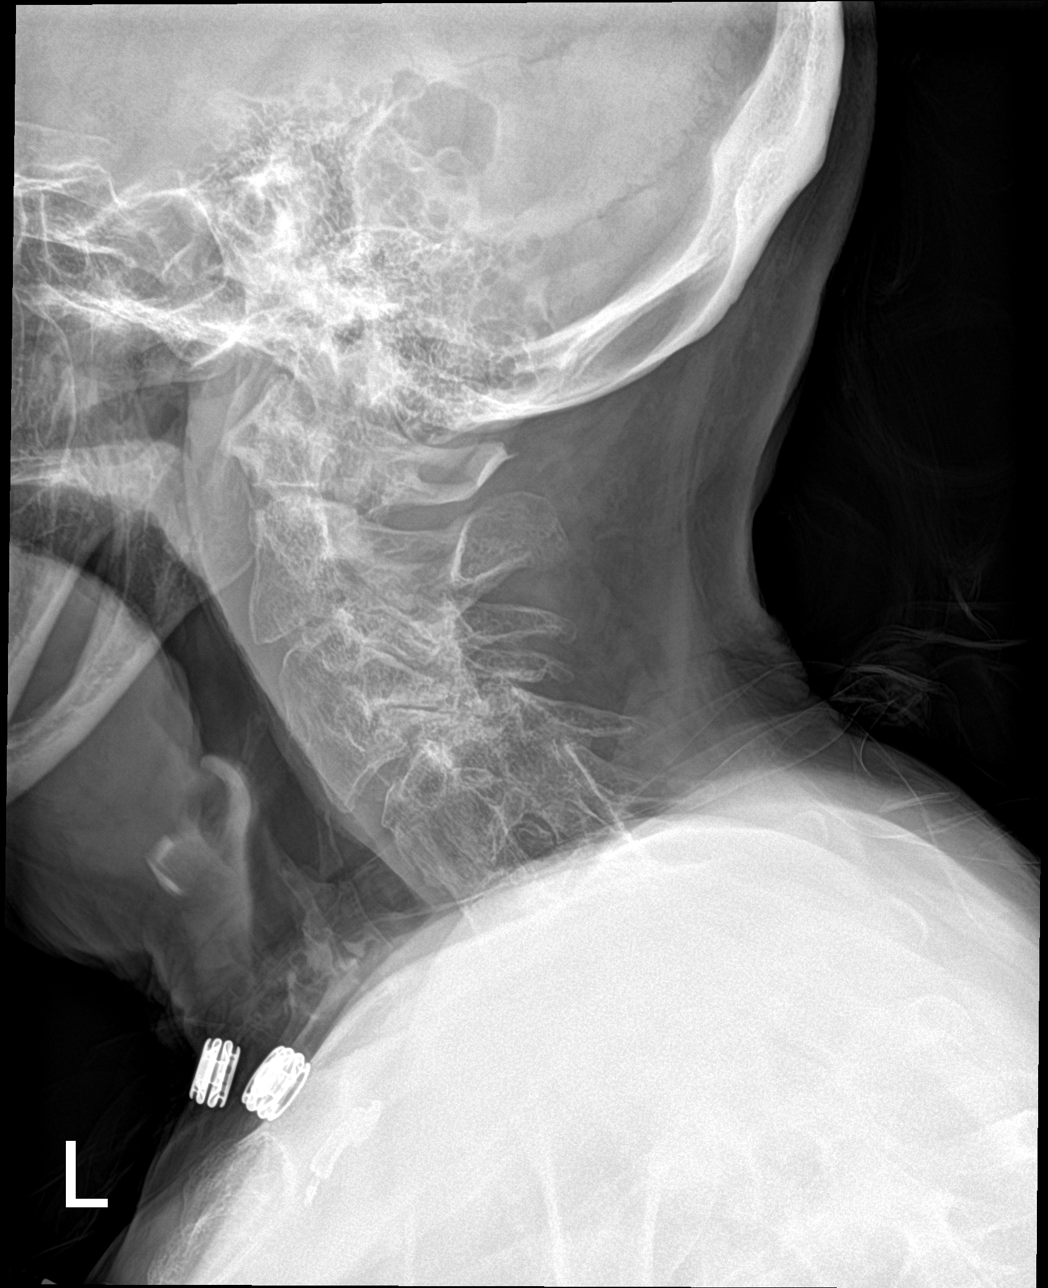

[c-spine ap]
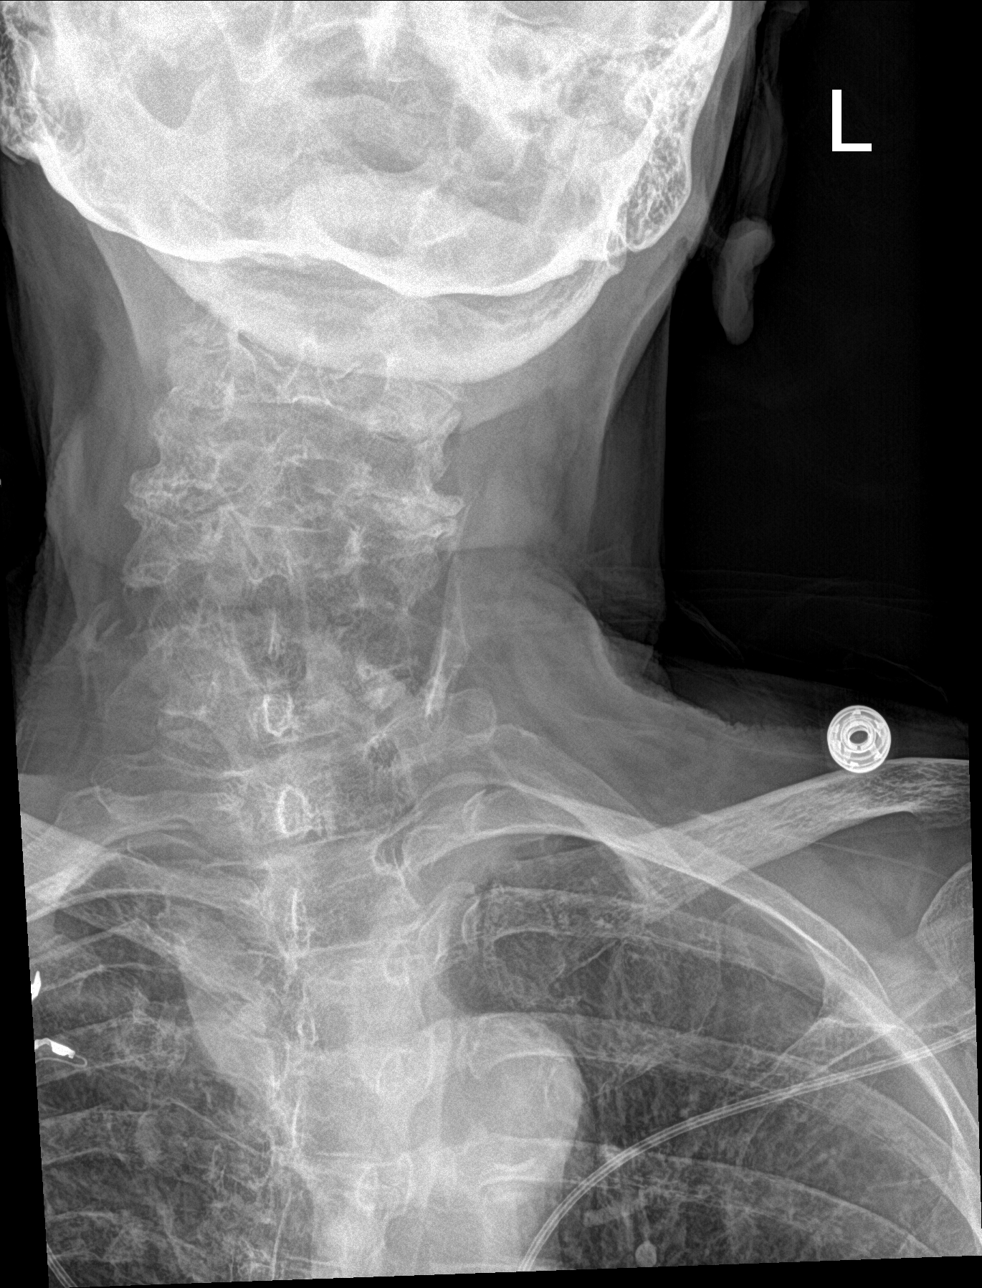

[c-spine swimmers]
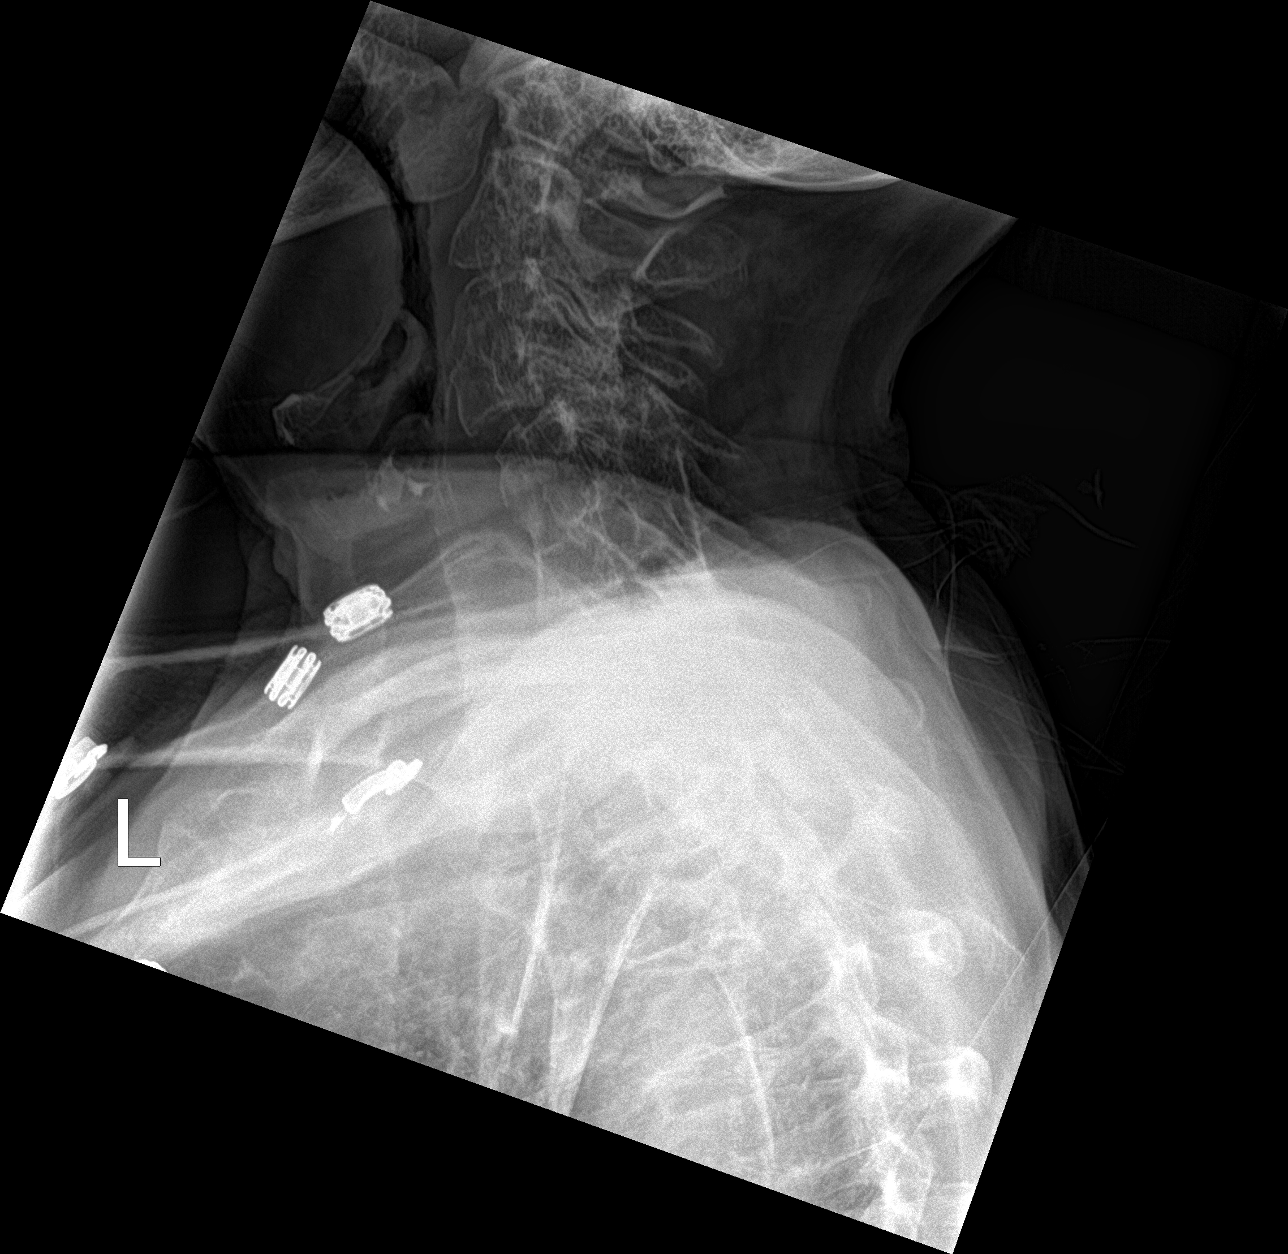

[3 of 3 positions shown; findings below may reference images not displayed]

FINDINGS: Three views of the cervical spine again demonstrate solid fusion
between C3 and C4, as well as C5-C7. There continues to be 4 mm of
anterolisthesis of C4 upon C5. Additionally, there is
dextroscoliosis of the thoracic spine convex to the right at the
level of C5 which appears to be chronic. Severe multilevel
degenerative disc disease and facet arthropathy redemonstrated. No
definite acute displaced fractures on today's plain film
examination.
IMPRESSION: 1. Advanced chronic degenerative changes and chronic postoperative
changes redemonstrated in the cervical spine, as above. No acute
findings identified.

## 2017-11-05 ENCOUNTER — Encounter: Payer: Self-pay | Admitting: Internal Medicine

## 2017-11-05 DIAGNOSIS — R440 Auditory hallucinations: Secondary | ICD-10-CM | POA: Diagnosis not present

## 2017-11-05 DIAGNOSIS — F0390 Unspecified dementia without behavioral disturbance: Secondary | ICD-10-CM | POA: Diagnosis not present

## 2017-11-05 DIAGNOSIS — F39 Unspecified mood [affective] disorder: Secondary | ICD-10-CM | POA: Diagnosis not present

## 2017-11-05 DIAGNOSIS — F411 Generalized anxiety disorder: Secondary | ICD-10-CM | POA: Diagnosis not present

## 2017-11-05 NOTE — Assessment & Plan Note (Signed)
Chronic; untreatable; patient is blind from same

## 2017-11-05 NOTE — Assessment & Plan Note (Signed)
No recent GFR, creatinine is 0.6 which is stable from prior

## 2017-11-05 NOTE — Assessment & Plan Note (Signed)
Sufficient; continue Florinef 0.1 mg every morning

## 2017-11-10 IMAGING — DX DG CHEST 1V PORT
1 series · 1 of 1 positions shown · non-contrast
Comparison: 01/16/2015 and earlier.

CLINICAL DATA: 86-year-old female with fever confusion and
weakness. Initial encounter.

EXAM:
PORTABLE CHEST 1 VIEW

[chest ap]
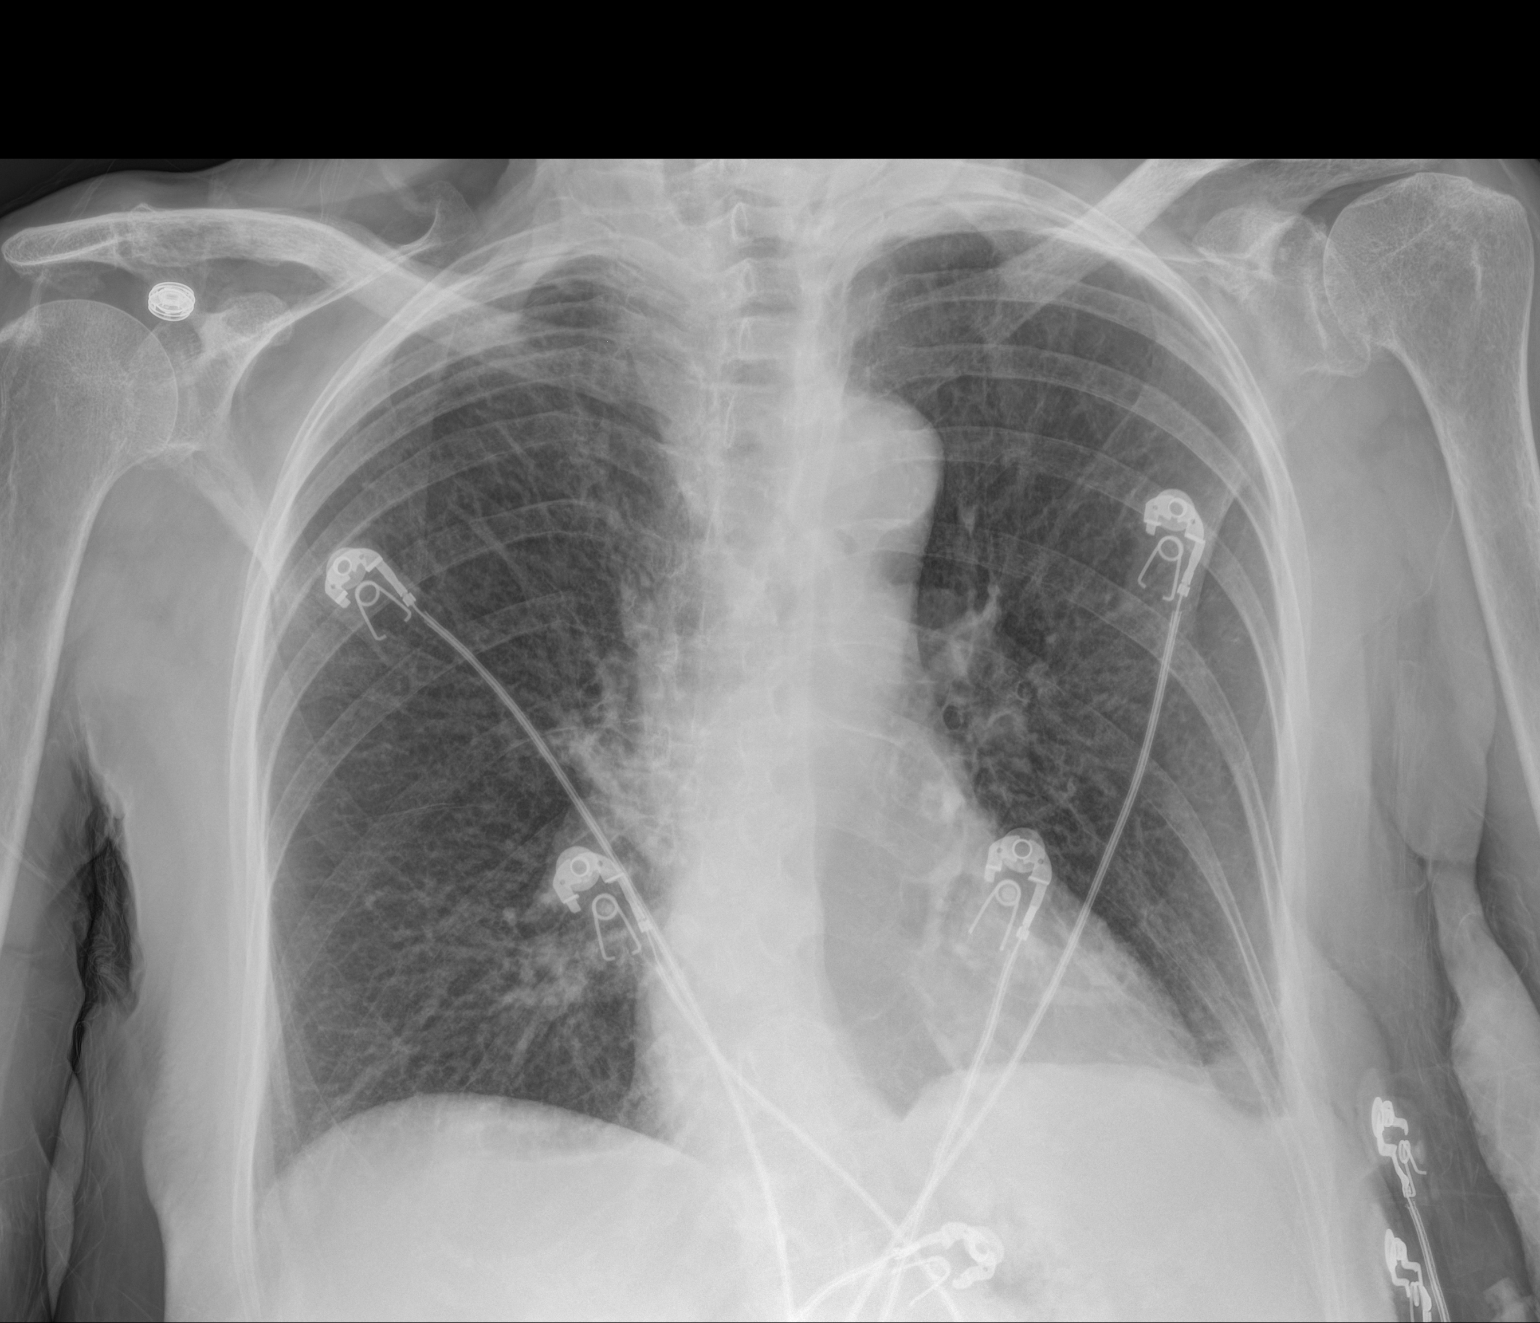

[1 of 1 positions shown; findings below may reference images not displayed]

FINDINGS: Portable AP semi upright view at [id number] hours. Stable lung volumes.
Increased interstitial opacity, but no overt edema or increased
effusions. Small left pleural effusion appears stable. No
pneumothorax or other new pulmonary opacity. Stable cardiac size and
mediastinal contours. Stable visualized osseous structures.
IMPRESSION: Increased pulmonary vascular congestion without overt edema. Stable
small left pleural effusion.

## 2017-11-20 DIAGNOSIS — R1312 Dysphagia, oropharyngeal phase: Secondary | ICD-10-CM | POA: Diagnosis not present

## 2017-11-20 DIAGNOSIS — Z9181 History of falling: Secondary | ICD-10-CM | POA: Diagnosis not present

## 2017-11-20 DIAGNOSIS — J449 Chronic obstructive pulmonary disease, unspecified: Secondary | ICD-10-CM | POA: Diagnosis not present

## 2017-11-20 DIAGNOSIS — M6281 Muscle weakness (generalized): Secondary | ICD-10-CM | POA: Diagnosis not present

## 2017-11-20 DIAGNOSIS — R2681 Unsteadiness on feet: Secondary | ICD-10-CM | POA: Diagnosis not present

## 2017-11-21 DIAGNOSIS — R2681 Unsteadiness on feet: Secondary | ICD-10-CM | POA: Diagnosis not present

## 2017-11-21 DIAGNOSIS — Z9181 History of falling: Secondary | ICD-10-CM | POA: Diagnosis not present

## 2017-11-21 DIAGNOSIS — M6281 Muscle weakness (generalized): Secondary | ICD-10-CM | POA: Diagnosis not present

## 2017-11-21 DIAGNOSIS — J449 Chronic obstructive pulmonary disease, unspecified: Secondary | ICD-10-CM | POA: Diagnosis not present

## 2017-11-21 DIAGNOSIS — R1312 Dysphagia, oropharyngeal phase: Secondary | ICD-10-CM | POA: Diagnosis not present

## 2017-11-22 ENCOUNTER — Non-Acute Institutional Stay (SKILLED_NURSING_FACILITY): Payer: Medicare Other | Admitting: Internal Medicine

## 2017-11-22 DIAGNOSIS — F319 Bipolar disorder, unspecified: Secondary | ICD-10-CM

## 2017-11-22 DIAGNOSIS — F0281 Dementia in other diseases classified elsewhere with behavioral disturbance: Secondary | ICD-10-CM

## 2017-11-22 DIAGNOSIS — Z710 Person encountering health services to consult on behalf of another person: Secondary | ICD-10-CM

## 2017-11-22 DIAGNOSIS — R1312 Dysphagia, oropharyngeal phase: Secondary | ICD-10-CM | POA: Diagnosis not present

## 2017-11-22 DIAGNOSIS — G301 Alzheimer's disease with late onset: Secondary | ICD-10-CM | POA: Diagnosis not present

## 2017-11-22 DIAGNOSIS — J449 Chronic obstructive pulmonary disease, unspecified: Secondary | ICD-10-CM | POA: Diagnosis not present

## 2017-11-22 DIAGNOSIS — F02818 Dementia in other diseases classified elsewhere, unspecified severity, with other behavioral disturbance: Secondary | ICD-10-CM

## 2017-11-22 DIAGNOSIS — R2681 Unsteadiness on feet: Secondary | ICD-10-CM | POA: Diagnosis not present

## 2017-11-22 DIAGNOSIS — Z9181 History of falling: Secondary | ICD-10-CM | POA: Diagnosis not present

## 2017-11-22 DIAGNOSIS — M6281 Muscle weakness (generalized): Secondary | ICD-10-CM | POA: Diagnosis not present

## 2017-11-23 DIAGNOSIS — M6281 Muscle weakness (generalized): Secondary | ICD-10-CM | POA: Diagnosis not present

## 2017-11-23 DIAGNOSIS — Z9181 History of falling: Secondary | ICD-10-CM | POA: Diagnosis not present

## 2017-11-23 DIAGNOSIS — R2681 Unsteadiness on feet: Secondary | ICD-10-CM | POA: Diagnosis not present

## 2017-11-23 DIAGNOSIS — R1312 Dysphagia, oropharyngeal phase: Secondary | ICD-10-CM | POA: Diagnosis not present

## 2017-11-23 DIAGNOSIS — J449 Chronic obstructive pulmonary disease, unspecified: Secondary | ICD-10-CM | POA: Diagnosis not present

## 2017-11-25 ENCOUNTER — Encounter: Payer: Self-pay | Admitting: Internal Medicine

## 2017-11-25 DIAGNOSIS — R1312 Dysphagia, oropharyngeal phase: Secondary | ICD-10-CM | POA: Diagnosis not present

## 2017-11-25 DIAGNOSIS — Z9181 History of falling: Secondary | ICD-10-CM | POA: Diagnosis not present

## 2017-11-25 DIAGNOSIS — R2681 Unsteadiness on feet: Secondary | ICD-10-CM | POA: Diagnosis not present

## 2017-11-25 DIAGNOSIS — J449 Chronic obstructive pulmonary disease, unspecified: Secondary | ICD-10-CM | POA: Diagnosis not present

## 2017-11-25 DIAGNOSIS — M6281 Muscle weakness (generalized): Secondary | ICD-10-CM | POA: Diagnosis not present

## 2017-11-25 NOTE — Progress Notes (Signed)
:    Location:  [facility] Room Number: 073X Place of Service:  SNF (31)  [name], MD  Patient Care Team: [name], MD as PCP - General (Internal Medicine) [name], MD (Gastroenterology) [name], NP (Obstetrics and Gynecology)  Extended Emergency Contact Information Primary Emergency Contact: [name],[name] Address: [place], [place] Phone: [phone] Mobile Phone: [phone] Relation: Daughter Secondary Emergency Contact: [name] Phone: [phone] Mobile Phone: [phone] Relation: Daughter     Allergies: Amoxicillin; Ampicillin; Depakote [valproic acid]; Macrobid [nitrofurantoin macrocrystal]; Penicillins; Azithromycin; Doxycycline; Escitalopram oxalate; Fioricet [butalbital-apap-caffeine]; Latex; Levofloxacin; Nsaids; Oxycodone; Restasis [cyclosporine]; Sulfa antibiotics; and Biaxin [clarithromycin]  Chief Complaint  Patient presents Taylor  . Acute Visit    HPI: [name] whose family has asked for meeting Taylor the nursing home administrator, the director of nursing, patient's floor supervisor nurse, physical therapy, dietary, social work and myself.  They have concerns they are airing, the main medical concern among many facility concerns, is that patient is not eating and that patient has had a rash around her mouth.  I have treated patient for herpes before so I will look at her mouth after the meeting.  Past Medical History:  Diagnosis Date  . Anxiety   . Arterial tortuosity (aorta) 12/11/2012  . Asthma   . Chronic kidney disease, stage IV (severe) ([place]) 12/11/2012  . Chronic mental illness   . CKD (chronic kidney disease), stage III ([place]) 01/14/2015  . Closed hip fracture requiring operative repair, right, Taylor routine healing, subsequent encounter 12/19/2015  . Colitis   . COPD  (chronic obstructive pulmonary disease) ([place])   . Decreased rectal sphincter tone 11/21/2012  . Depression   . Drug-seeking behavior 02/26/2011   Use of multiple md to obtain benzos/narcotics pt must sign contract to receive controlled meds.   Marland Kitchen Dysphagia 01/11/2015  . GERD (gastroesophageal reflux disease)   . Glaucoma 06/26/2011  . Glaucoma of both eyes   . Hearing loss, sensorineural 03/04/2014   [facility] ENT; 02/26/14. There is not a medical or surgical treatment that would benefit her type of hearing loss. We discussed amplification in an overview fashion.    Marland Kitchen History of fall   . Insomnia, unspecified   . Macular degeneration 06/26/2011  . Migraines   . Nasal bone fracture 03/30/2014  . Osteoporosis   . Osteoporosis 02/26/2011  . Presence of right artificial hip joint   . Protein-calorie malnutrition, severe ([place]) 12/10/2012  . S/P ORIF (open reduction internal fixation) fracture 01/08/2016  . Schatzki's ring 01/14/2015  . Scoliosis   . Substance abuse ([place])    Benbzos and Narcotics, Roth does not get any of those medicines from [facility], we have empathetically told her that.  . Vertigo   . Vitamin B deficiency   . Vitamin D deficiency 08/25/2016  . Weight loss     Past Surgical History:  Procedure Laterality Date  . ABDOMINAL HYSTERECTOMY    . CATARACT EXTRACTION    . CHOLECYSTECTOMY    . ESOPHAGOGASTRODUODENOSCOPY (EGD) Taylor PROPOFOL N/A 01/13/2015   Procedure: ESOPHAGOGASTRODUODENOSCOPY (EGD) Taylor PROPOFOL;  Surgeon: [name], MD;  Location: [facility];  Service: Endoscopy;  Laterality: N/A;  . HIP ARTHROPLASTY Right 12/21/2015   Procedure: ARTHROPLASTY BIPOLAR HIP (HEMIARTHROPLASTY);  Surgeon: [name], MD;  Location: [facility];  Service:  Orthopedics;  Laterality: Right;  . PARTIAL HYSTERECTOMY      Allergies as of 11/22/2017      Reactions   Amoxicillin    Per daughter   Ampicillin    Per daughter   Depakote [valproic Acid]    DOES NOT WANT HER TO TAKE  IT==Makes her hair fall out= per daughter who is poa   Macrobid [nitrofurantoin Macrocrystal] Rash   Per MAR   Penicillins    Per daughter   Azithromycin Other (See Comments)   Per MAR   Doxycycline Other (See Comments)   Per MAR   Escitalopram Oxalate Other (See Comments)   Per MAR   Fioricet [butalbital-apap-caffeine] Other (See Comments)   Drunk. Per MAR   Latex Other (See Comments)   Per MAR   Levofloxacin Other (See Comments)   Per MAR   Nsaids Other (See Comments)   This is not an allergy. The patient was told by Dr. Rockne Menghini to avoid NSAIDs and stop Vicoprofen in to 2014 to avoid nephrotoxicity. Per MAR.   Oxycodone Other (See Comments)   reaction to synthetic codeine Per MAR   Restasis [cyclosporine] Other (See Comments)   Per MAR   Sulfa Antibiotics Other (See Comments)   Per MAR   Biaxin [clarithromycin] Rash   Taylor burning sensation Per [facility]      Medication List        Accurate as of 11/22/17 11:59 PM. Always use your most recent med list.          acetaminophen 325 MG tablet Commonly known as:  TYLENOL Take 650 mg by mouth every 6 (six) hours as needed for headache.   albuterol 108 (90 Base) MCG/ACT inhaler Commonly known as:  PROVENTIL HFA;VENTOLIN HFA Inhale 1 puff into the lungs every 6 (six) hours as needed for shortness of breath.   Biotin 5000 MCG Caps Take 1 capsule by mouth daily.   CLARITIN 10 MG tablet Generic drug:  loratadine Take 10 mg by mouth daily.   clonazePAM 0.5 MG tablet Commonly known as:  KLONOPIN Take 0.25 mg by mouth 2 (two) times daily.   Cranberry 425 MG Caps Take 425 mg by mouth 2 (two) times daily.   docusate sodium 100 MG capsule Commonly known as:  COLACE Take 100 mg by mouth 2 (two) times daily.   dorzolamide-timolol 22.3-6.8 MG/ML ophthalmic solution Commonly known as:  COSOPT Place 1 drop into both eyes 2 (two) times daily.   ENSURE Take 237 mLs by mouth 4 (four) times daily. D/t weight loss   ferrous  sulfate 325 (65 FE) MG tablet Take 325 mg by mouth daily.   fludrocortisone 0.1 MG tablet Commonly known as:  FLORINEF Take 0.1 mg by mouth every morning. for orthostatic hypotension   HYDROcodone-acetaminophen 5-325 MG tablet Commonly known as:  NORCO/VICODIN Take 1 tablet by mouth 3 (three) times daily. At 9 am , 1 pm , and 9 pm for pain   lactulose 10 GM/15ML solution Commonly known as:  CHRONULAC Take 15 mLs (10 g total) by mouth daily as needed for severe constipation.   magnesium hydroxide 400 MG/5ML suspension Commonly known as:  MILK OF MAGNESIA Take 30 mLs by mouth daily as needed for mild constipation.   mirtazapine 15 MG tablet Commonly known as:  REMERON Take 7.5 mg by mouth at bedtime.   nitroGLYCERIN 0.4 MG SL tablet Commonly known as:  NITROSTAT Place 0.4 mg under the tongue every 5 (five) minutes as needed for chest  pain. May use 3 times   polyethylene glycol packet Commonly known as:  MIRALAX / GLYCOLAX Take 17 g by mouth daily.   QUEtiapine 300 MG tablet Commonly known as:  SEROQUEL Take 300 mg by mouth at bedtime. Give Taylor a 50 mg tablet to = 350 mg   QUEtiapine 100 MG tablet Commonly known as:  SEROQUEL Take 100 mg by mouth at bedtime.   senna 8.6 MG tablet Commonly known as:  SENOKOT Take 2 tablets by mouth 2 (two) times daily as needed for constipation.   SYSTANE 0.4-0.3 % Soln Generic drug:  Polyethyl Glycol-Propyl Glycol Apply to eye.   travoprost (benzalkonium) 0.004 % ophthalmic solution Commonly known as:  TRAVATAN Place 1 drop into both eyes at bedtime.   vitamin B-12 1000 MCG tablet Commonly known as:  CYANOCOBALAMIN Take 1,000 mcg by mouth daily.   Vitamin D 1000 units capsule Take 1,000 Units by mouth daily Taylor breakfast.       Taylor orders of the defined types were placed in this encounter.   Immunization History  Administered Date(s) Administered  . Influenza Split 11/13/2011  . Influenza-Unspecified 02/13/2008,  11/30/2013, 12/20/2016  . PPD Test 12/15/2015  . Pneumococcal-Unspecified 02/13/2008, 10/16/2016  . Td 02/12/2009    Social History   Tobacco Use  . Smoking status: Former Smoker    Packs/day: 0.50    Types: Cigarettes  . Smokeless tobacco: Never Used  Substance Use Topics  . Alcohol use: Taylor    Family history is   Family History  Problem Relation Age of Onset  . Stroke Mother   . Diabetes Mother   . Heart disease Mother   . Hearing loss Mother   . Cancer Father 88       esophageal      Review of Systems  DATA OBTAINED: from patient, family member GENERAL:  Taylor fevers, fatigue, appetite changes SKIN: Rash on mouth for several days, patient says is better EYES: Taylor eye pain, redness, discharge EARS: Taylor earache, tinnitus, change in hearing NOSE: Taylor congestion, drainage or bleeding  MOUTH/THROAT: Taylor mouth or tooth pain, Taylor sore throat RESPIRATORY: Taylor cough, wheezing, SOB CARDIAC: Taylor chest pain, palpitations, lower extremity edema  GI: Taylor abdominal pain, Taylor N/V/D or constipation, Taylor heartburn or reflux  GU: Taylor dysuria, frequency or urgency, or incontinence  MUSCULOSKELETAL: Taylor unrelieved bone/joint pain NEUROLOGIC: Taylor headache, dizziness or focal weakness PSYCHIATRIC: Taylor c/o anxiety or sadness   Vitals:   11/25/17 1313  BP: 137/81  Pulse: 78  Resp: 18  Temp: 98.7 F (37.1 C)  SpO2: 96%    SpO2 Readings from Last 1 Encounters:  11/25/17 96%   Body mass index is 17.77 kg/m.     Physical Exam  GENERAL APPEARANCE: Alert, conversant,  Taylor acute distress.  SKIN: Taylor diaphoresis rash HEAD: Normocephalic, atraumatic  EYES: Conjunctiva/lids clear. Pupils round, reactive. EOMs intact.  EARS: External exam WNL, canals clear. Hearing grossly normal.  NOSE: Taylor deformity or discharge.  MOUTH/THROAT: Lips w/o lesions: Taylor lesions around lips or on chin; there were several lesions that appear to be old/drying up RESPIRATORY: Breathing is even, unlabored. Lung sounds  are clear   CARDIOVASCULAR: Heart RRR Taylor murmurs, rubs or gallops. Taylor peripheral edema.   GASTROINTESTINAL: Abdomen is soft, non-tender, not distended w/ normal bowel sounds.:  Patient has a good appetite today and Roth sits in front of her tray eating GENITOURINARY: Bladder non tender, not distended  MUSCULOSKELETAL: Taylor abnormal joints or musculature NEUROLOGIC:  Cranial nerves 2-12 grossly intact. Moves all extremities  PSYCHIATRIC: Mood and affect Taylor dementia, Taylor behavioral issues  Patient Active Problem List   Diagnosis Date Noted  . Abnormal EKG 01/24/2017  . Delusions ([place])   . Paranoia ([place])   . Vitamin D deficiency 08/25/2016  . Dementia without behavioral disturbance ([place]) 03/25/2016  . S/P ORIF (open reduction internal fixation) fracture 01/08/2016  . Oral thrush 01/08/2016  . HTN (hypertension) 01/08/2016  . Closed hip fracture requiring operative repair, right, Taylor routine healing, subsequent encounter 12/19/2015  . Bipolar disease, chronic ([place]) 12/17/2015  . Decubitus ulcer of hip, stage 3 ([place]) 12/11/2015  . Legally blind 08/16/2015  . Ankle pain   . Encephalopathy   . Malnutrition of moderate degree 01/29/2015  . Altered mental status 01/26/2015  . Nausea and vomiting 01/17/2015  . Esophageal dysmotilities   . Urinary tract infectious disease   . Weakness 01/14/2015  . Schatzki's ring 01/14/2015  . CKD (chronic kidney disease), stage III ([place]) 01/14/2015  . Esophagus disorder   . Dysphagia 01/11/2015  . UTI (lower urinary tract infection) 03/30/2014  . Hearing loss, sensorineural 03/04/2014  . Nasal lesion 03/04/2014  . Constipation 12/12/2012  . Hypokalemia 12/11/2012  . Orthostatic hypotension 12/11/2012  . Arterial tortuosity (aorta) 12/11/2012  . Protein-calorie malnutrition, severe ([place]) 12/10/2012  . Adult failure to thrive 12/09/2012  . Decreased rectal sphincter tone 11/21/2012  . Primary open angle glaucoma of both eyes, severe stage 11/11/2012    . Idiopathic scoliosis 07/08/2012  . Macular atrophy, retinal 06/26/2011  . Vision disturbance 06/26/2011  . Glaucoma 06/26/2011  . Nonexudative senile macular degeneration of retina 06/21/2011  . Macula scar of posterior pole 06/21/2011  . Status post intraocular lens implant 06/21/2011  . Anemia of chronic disease 04/17/2011  . Drug-seeking behavior 02/26/2011  . Osteoporosis 02/26/2011  . Depression   . Anxiety   . COPD (chronic obstructive pulmonary disease) ([place])   . GERD (gastroesophageal reflux disease)   . Migraines   . Insomnia, unspecified       Labs reviewed: Basic Metabolic Panel:    Component Value Date/Time   NA 140 07/31/2017   K 5.0 07/31/2017   CL 109 12/12/2016 1428   CO2 22 12/12/2016 1428   GLUCOSE 90 12/12/2016 1428   BUN 23 (A) 07/31/2017   CREATININE 0.62 07/31/2017   CALCIUM 8.9 07/31/2017   PROT 5.8 07/31/2017   ALBUMIN 3.5 07/31/2017   AST 14 07/31/2017   ALT 5 07/31/2017   ALKPHOS 87 07/31/2017   BILITOT 0.3 07/31/2017   GFRNONAA 81.51 07/31/2017   GFRNONAA 64 11/09/2013 1135   GFRAA >60 12/12/2016 1428   GFRAA 74 11/09/2013 1135    Recent Labs    12/12/16 1428 02/22/17 07/31/17  NA 142 143 140  K 3.6 4.4 5.0  CL 109  --   --   CO2 22  --   --   GLUCOSE 90  --   --   BUN 14 17 23*  CREATININE 0.67 0.6 0.62  CALCIUM 9.3  --  8.9   Liver Function Tests: Recent Labs    12/12/16 1428 02/22/17 07/31/17  AST 20 16 14   ALT 16 9 5   ALKPHOS 85 117 87  BILITOT 0.7  --  0.3  PROT 6.5  --  5.8  ALBUMIN 3.4*  --  3.5   Taylor results for input(s): LIPASE, AMYLASE in the last 8760 hours. Taylor results for input(s): AMMONIA in the last 8760  hours. CBC: Recent Labs    12/12/16 1428 02/22/17 03/19/17 07/31/17  WBC 7.8 4.8 7.8 5.7  NEUTROABS 4.6  --   --   --   HGB 12.8 13.2 15.6 12.4  HCT 39.9 40 48.6 37.3  MCV 80.4  --   --  87.2  PLT 239 218  --   --    Lipid Recent Labs    07/31/17  CHOL 134  LDLCALC 61  TRIG 102     Cardiac Enzymes: Taylor results for input(s): CKTOTAL, CKMB, CKMBINDEX, TROPONINI in the last 8760 hours. BNP: No results for input(s): BNP in the last 8760 hours. Taylor results found for: [facility] Results  Component Value Date   HGBA1C 5.4 06/11/2016   Lab Results  Component Value Date   TSH 0.59 07/31/2017   Taylor results found for: VITAMINB12 Taylor results found for: FOLATE Taylor results found for: IRON, TIBC, FERRITIN  Imaging and Procedures obtained prior to SNF admission: Ct Chest Wo Contrast  Result Date: 05/15/2017 CLINICAL DATA:  Cough EXAM: CT CHEST WITHOUT CONTRAST TECHNIQUE: Multidetector CT imaging of the chest was performed following the standard protocol without IV contrast. COMPARISON:  12/23/2015 FINDINGS: Cardiovascular: Atherosclerotic calcifications in the thoracic aorta. Maximal diameter of the ascending aorta is 3.2 cm. Mild 3 vessel coronary artery calcification. Mediastinum/Nodes: Taylor abnormal mediastinal adenopathy. Taylor pericardial effusion. Thyroid is unremarkable. Lungs/Pleura: Advanced emphysema. Parenchymal scarring at the lung apices. Airspace and ill-defined nodular opacities are present in the central right lower lobe. Left lung is relatively clear. Right middle lobe is clear. Taylor endobronchial lesion. Taylor pneumothorax. Tiny right pleural effusion. Upper Abdomen: Taylor acute abnormality. Musculoskeletal: Dextroscoliosis. Stable mild T2 compression deformity. IMPRESSION: There are airspace and ill-defined nodular opacities in the right lower lobe. These findings may reflect an inflammatory process. Underlying malignancy is not excluded. Follow-up in 3 months is recommended. Initial follow-up by chest CT without contrast is recommended in 3 months to confirm persistence. This recommendation follows the consensus statement: Recommendations for the Management of Subsolid Pulmonary Nodules Detected at CT: A Statement from the [place] as published in Radiology 2013;  266:304-317. Aortic Atherosclerosis (ICD10-I70.0) and Emphysema (ICD10-J43.9). Electronically Signed   By: Marybelle Killings M.D.   On: 05/15/2017 08:12     Not all labs, radiology exams or other studies done during hospitalization come through on my EPIC note; however they are reviewed by me.    Assessment and Plan  Encounter for family conference- conference Taylor nursing administration, nursing, social work, dietary.  There are several nursing home operational complaints and 2 medical complaints.  First concern was patient's not eating.  It was explained to family that even when Roth is fed Roth refuses to eat and that Taylor one can force her to eat although dietary will work Taylor her on things that Roth likes to eat.  Family was also concerned that patient had had a rash on her mouth and that Roth was rubbing her mouth.  They do admit that Roth has a habit of rubbing her mouth as well so it may just be irritation.  Examination of her mouth today did not show any rash or lesions that were consistent Taylor herpes, patient did not need treatment today.  By the end of the meeting it appeared the family was satisfied Taylor voicing their concerns.   Time spent greater than 35 minutes Dajha Urquilla D. [name], MD

## 2017-11-26 ENCOUNTER — Encounter: Payer: Self-pay | Admitting: Internal Medicine

## 2017-11-26 ENCOUNTER — Non-Acute Institutional Stay (SKILLED_NURSING_FACILITY): Payer: Medicare Other | Admitting: Internal Medicine

## 2017-11-26 DIAGNOSIS — M6281 Muscle weakness (generalized): Secondary | ICD-10-CM | POA: Diagnosis not present

## 2017-11-26 DIAGNOSIS — J449 Chronic obstructive pulmonary disease, unspecified: Secondary | ICD-10-CM | POA: Diagnosis not present

## 2017-11-26 DIAGNOSIS — L603 Nail dystrophy: Secondary | ICD-10-CM | POA: Insufficient documentation

## 2017-11-26 DIAGNOSIS — F419 Anxiety disorder, unspecified: Secondary | ICD-10-CM

## 2017-11-26 DIAGNOSIS — R627 Adult failure to thrive: Secondary | ICD-10-CM

## 2017-11-26 DIAGNOSIS — Z9181 History of falling: Secondary | ICD-10-CM | POA: Diagnosis not present

## 2017-11-26 DIAGNOSIS — K219 Gastro-esophageal reflux disease without esophagitis: Secondary | ICD-10-CM

## 2017-11-26 DIAGNOSIS — R1312 Dysphagia, oropharyngeal phase: Secondary | ICD-10-CM | POA: Diagnosis not present

## 2017-11-26 DIAGNOSIS — R2681 Unsteadiness on feet: Secondary | ICD-10-CM | POA: Diagnosis not present

## 2017-11-26 NOTE — Progress Notes (Signed)
Location:  [facility] Room Number: 557D Place of Service:  SNF [phone])  Taylor Delaine. [name], MD  Patient Care Team: [name], MD as PCP - General (Internal Medicine) [name], MD (Gastroenterology) [name], NP (Obstetrics and Gynecology)  Extended Emergency Contact Information Primary Emergency Contact: [name],[name] Address: [place], [place] Phone: [phone] Mobile Phone: [phone] Relation: Daughter Secondary Emergency Contact: [name] Phone: [phone] Mobile Phone: [phone] Relation: Daughter    Allergies: Amoxicillin; Ampicillin; Depakote [valproic acid]; Macrobid [nitrofurantoin macrocrystal]; Penicillins; Azithromycin; Doxycycline; Escitalopram oxalate; Fioricet [butalbital-apap-caffeine]; Latex; Levofloxacin; Nsaids; Oxycodone; Restasis [cyclosporine]; Sulfa antibiotics; and Biaxin [clarithromycin]  Chief Complaint  Patient presents with  . Medical Management of Chronic Issues    Routine Visit    HPI: [name] who is being seen for routine issues of anxiety, GERD, and failure to thrive.  Past Medical History:  Diagnosis Date  . Anxiety   . Arterial tortuosity (aorta) 12/11/2012  . Asthma   . Chronic kidney disease, stage IV (severe) ([place]) 12/11/2012  . Chronic mental illness   . CKD (chronic kidney disease), stage III ([place]) 01/14/2015  . Closed hip fracture requiring operative repair, right, with routine healing, subsequent encounter 12/19/2015  . Colitis   . COPD (chronic obstructive pulmonary disease) ([place])   . Decreased rectal sphincter tone 11/21/2012  . Depression   . Drug-seeking behavior 02/26/2011   Use of multiple md to obtain benzos/narcotics pt must sign contract to receive controlled meds.   Marland Kitchen Dysphagia 01/11/2015  . GERD (gastroesophageal reflux disease)   . Glaucoma  06/26/2011  . Glaucoma of both eyes   . Hearing loss, sensorineural 03/04/2014   [facility]; 02/26/14. There is not a medical or surgical treatment that would benefit her type of hearing loss. We discussed amplification in an overview fashion.    Marland Kitchen History of fall   . Insomnia, unspecified   . Macular degeneration 06/26/2011  . Migraines   . Nasal bone fracture 03/30/2014  . Osteoporosis   . Osteoporosis 02/26/2011  . Presence of right artificial hip joint   . Protein-calorie malnutrition, severe ([place]) 12/10/2012  . S/P ORIF (open reduction internal fixation) fracture 01/08/2016  . Schatzki's ring 01/14/2015  . Scoliosis   . Substance abuse ([place])    Benbzos and Narcotics, she does not get any of those medicines from [facility], we have empathetically told her that.  . Vertigo   . Vitamin B deficiency   . Vitamin D deficiency 08/25/2016  . Weight loss     Past Surgical History:  Procedure Laterality Date  . ABDOMINAL HYSTERECTOMY    . CATARACT EXTRACTION    . CHOLECYSTECTOMY    . ESOPHAGOGASTRODUODENOSCOPY (EGD) WITH PROPOFOL N/A 01/13/2015   Procedure: ESOPHAGOGASTRODUODENOSCOPY (EGD) WITH PROPOFOL;  Surgeon: [name], MD;  Location: [facility];  Service: Endoscopy;  Laterality: N/A;  . HIP ARTHROPLASTY Right 12/21/2015   Procedure: ARTHROPLASTY BIPOLAR HIP (HEMIARTHROPLASTY);  Surgeon: [name], MD;  Location: [facility];  Service: Orthopedics;  Laterality: Right;  . PARTIAL HYSTERECTOMY      Allergies as of 11/26/2017      Reactions   Amoxicillin    Per daughter   Ampicillin    Per daughter   Depakote [valproic Acid]    DOES NOT WANT HER TO TAKE IT==Makes her hair fall  out= per daughter who is poa   Macrobid [nitrofurantoin Macrocrystal] Rash   Per MAR   Penicillins    Per daughter   Azithromycin Other (See Comments)   Per MAR   Doxycycline Other (See Comments)   Per MAR   Escitalopram Oxalate Other (See Comments)   Per MAR   Fioricet  [butalbital-apap-caffeine] Other (See Comments)   Drunk. Per MAR   Latex Other (See Comments)   Per MAR   Levofloxacin Other (See Comments)   Per MAR   Nsaids Other (See Comments)   This is not an allergy. The patient was told by Dr. Rockne Menghini to avoid NSAIDs and stop Vicoprofen in to 2014 to avoid nephrotoxicity. Per MAR.   Oxycodone Other (See Comments)   reaction to synthetic codeine Per MAR   Restasis [cyclosporine] Other (See Comments)   Per MAR   Sulfa Antibiotics Other (See Comments)   Per MAR   Biaxin [clarithromycin] Rash   With burning sensation Per [facility]      Medication List        Accurate as of 11/26/17 11:59 PM. Always use your most recent med list.          acetaminophen 325 MG tablet Commonly known as:  TYLENOL Take 650 mg by mouth every 6 (six) hours as needed for headache.   albuterol 108 (90 Base) MCG/ACT inhaler Commonly known as:  PROVENTIL HFA;VENTOLIN HFA Inhale 1 puff into the lungs every 6 (six) hours as needed for shortness of breath.   Biotin 5000 MCG Caps Take 1 capsule by mouth daily.   CLARITIN 10 MG tablet Generic drug:  loratadine Take 10 mg by mouth daily.   clonazePAM 0.5 MG tablet Commonly known as:  KLONOPIN Take 0.25 mg by mouth 2 (two) times daily.   Cranberry 425 MG Caps Take 425 mg by mouth 2 (two) times daily.   docusate sodium 100 MG capsule Commonly known as:  COLACE Take 100 mg by mouth 2 (two) times daily.   dorzolamide-timolol 22.3-6.8 MG/ML ophthalmic solution Commonly known as:  COSOPT Place 1 drop into both eyes 2 (two) times daily.   ENSURE Take 237 mLs by mouth 4 (four) times daily. D/t weight loss   ferrous sulfate 325 (65 FE) MG tablet Take 325 mg by mouth daily.   fludrocortisone 0.1 MG tablet Commonly known as:  FLORINEF Take 0.1 mg by mouth every morning. for orthostatic hypotension   HYDROcodone-acetaminophen 5-325 MG tablet Commonly known as:  NORCO/VICODIN Take 1 tablet by mouth 3 (three) times  daily. At 9 am , 1 pm , and 9 pm for pain   lactulose 10 GM/15ML solution Commonly known as:  CHRONULAC Take 15 mLs (10 g total) by mouth daily as needed for severe constipation.   magnesium hydroxide 400 MG/5ML suspension Commonly known as:  MILK OF MAGNESIA Take 30 mLs by mouth daily as needed for mild constipation.   mirtazapine 15 MG tablet Commonly known as:  REMERON Take 7.5 mg by mouth at bedtime.   nitroGLYCERIN 0.4 MG SL tablet Commonly known as:  NITROSTAT Place 0.4 mg under the tongue every 5 (five) minutes as needed for chest pain. May use 3 times   polyethylene glycol packet Commonly known as:  MIRALAX / GLYCOLAX Take 17 g by mouth daily.   QUEtiapine 300 MG tablet Commonly known as:  SEROQUEL Take 300 mg by mouth at bedtime. Give with a 50 mg tablet to = 350 mg   QUEtiapine 100 MG tablet  Commonly known as:  SEROQUEL Take 100 mg by mouth at bedtime.   senna 8.6 MG tablet Commonly known as:  SENOKOT Take 2 tablets by mouth 2 (two) times daily as needed for constipation.   SYSTANE 0.4-0.3 % Soln Generic drug:  Polyethyl Glycol-Propyl Glycol Apply to eye.   travoprost (benzalkonium) 0.004 % ophthalmic solution Commonly known as:  TRAVATAN Place 1 drop into both eyes at bedtime.   vitamin B-12 1000 MCG tablet Commonly known as:  CYANOCOBALAMIN Take 1,000 mcg by mouth daily.   Vitamin D 1000 units capsule Take 1,000 Units by mouth daily with breakfast.       No orders of the defined types were placed in this encounter.   Immunization History  Administered Date(s) Administered  . Influenza Split 11/13/2011  . Influenza-Unspecified 02/13/2008, 11/30/2013, 12/20/2016  . PPD Test 12/15/2015  . Pneumococcal-Unspecified 02/13/2008, 10/16/2016  . Td 02/12/2009    Social History   Tobacco Use  . Smoking status: Former Smoker    Packs/day: 0.50    Types: Cigarettes  . Smokeless tobacco: Never Used  Substance Use Topics  . Alcohol use: No     Review of Systems  DATA OBTAINED: from patient-limited; nursing-no acute concerns except for appetite GENERAL:  no fevers, fatigue, appetite changes SKIN: No itching, rash HEENT: No complaint RESPIRATORY: No cough, wheezing, SOB CARDIAC: No chest pain, palpitations, lower extremity edema  GI: No abdominal pain, No N/V/D or constipation, No heartburn or reflux  GU: No dysuria, frequency or urgency, or incontinence  MUSCULOSKELETAL: No unrelieved bone/joint pain NEUROLOGIC: No headache, dizziness  PSYCHIATRIC: No overt anxiety or sadness  Vitals:   11/26/17 1341  BP: 137/81  Pulse: 78  Resp: 18  Temp: 98.7 F (37.1 C)   Body mass index is 17.77 kg/m. Physical Exam  GENERAL APPEARANCE: Alert, conversant, No acute distress  SKIN: No diaphoresis rash HEENT: Unremarkable RESPIRATORY: Breathing is even, unlabored. Lung sounds are clear   CARDIOVASCULAR: Heart RRR no murmurs, rubs or gallops. No peripheral edema  GASTROINTESTINAL: Abdomen is soft, non-tender, not distended w/ normal bowel sounds.  GENITOURINARY: Bladder non tender, not distended  MUSCULOSKELETAL: No abnormal joints or musculature NEUROLOGIC: Cranial nerves 2-12 grossly intact. Moves all extremities PSYCHIATRIC: Mood and affect with dementia, no behavioral issues  Patient Active Problem List   Diagnosis Date Noted  . Abnormal EKG 01/24/2017  . Delusions ([place])   . Paranoia ([place])   . Vitamin D deficiency 08/25/2016  . Dementia with behavioral problem ([place]) 03/25/2016  . S/P ORIF (open reduction internal fixation) fracture 01/08/2016  . Oral thrush 01/08/2016  . HTN (hypertension) 01/08/2016  . Closed hip fracture requiring operative repair, right, with routine healing, subsequent encounter 12/19/2015  . Bipolar disease, chronic ([place]) 12/17/2015  . Decubitus ulcer of hip, stage 3 ([place]) 12/11/2015  . Legally blind 08/16/2015  . Ankle pain   . Encephalopathy   . Malnutrition of moderate degree 01/29/2015   . Altered mental status 01/26/2015  . Nausea and vomiting 01/17/2015  . Esophageal dysmotilities   . Urinary tract infectious disease   . Weakness 01/14/2015  . Schatzki's ring 01/14/2015  . CKD (chronic kidney disease), stage III ([place]) 01/14/2015  . Esophagus disorder   . Dysphagia 01/11/2015  . UTI (lower urinary tract infection) 03/30/2014  . Hearing loss, sensorineural 03/04/2014  . Nasal lesion 03/04/2014  . Constipation 12/12/2012  . Hypokalemia 12/11/2012  . Orthostatic hypotension 12/11/2012  . Arterial tortuosity (aorta) 12/11/2012  . Protein-calorie malnutrition, severe ([place])  12/10/2012  . Adult failure to thrive 12/09/2012  . Decreased rectal sphincter tone 11/21/2012  . Primary open angle glaucoma of both eyes, severe stage 11/11/2012  . Idiopathic scoliosis 07/08/2012  . Macular atrophy, retinal 06/26/2011  . Vision disturbance 06/26/2011  . Glaucoma 06/26/2011  . Nonexudative senile macular degeneration of retina 06/21/2011  . Macula scar of posterior pole 06/21/2011  . Status post intraocular lens implant 06/21/2011  . Anemia of chronic disease 04/17/2011  . Drug-seeking behavior 02/26/2011  . Osteoporosis 02/26/2011  . Depression   . Anxiety   . COPD (chronic obstructive pulmonary disease) ([place])   . GERD (gastroesophageal reflux disease)   . Migraines   . Insomnia, unspecified     CMP     Component Value Date/Time   NA 140 07/31/2017   K 5.0 07/31/2017   CL 109 12/12/2016 1428   CO2 22 12/12/2016 1428   GLUCOSE 90 12/12/2016 1428   BUN 23 (A) 07/31/2017   CREATININE 0.62 07/31/2017   CALCIUM 8.9 07/31/2017   PROT 5.8 07/31/2017   ALBUMIN 3.5 07/31/2017   AST 14 07/31/2017   ALT 5 07/31/2017   ALKPHOS 87 07/31/2017   BILITOT 0.3 07/31/2017   GFRNONAA 81.51 07/31/2017   GFRNONAA 64 11/09/2013 1135   GFRAA >60 12/12/2016 1428   GFRAA 74 11/09/2013 1135   Recent Labs    02/22/17 07/31/17  NA 143 140  K 4.4 5.0  BUN 17 23*  CREATININE 0.6  0.62  CALCIUM  --  8.9   Recent Labs    02/22/17 07/31/17  AST 16 14  ALT 9 5  ALKPHOS 117 87  BILITOT  --  0.3  PROT  --  5.8  ALBUMIN  --  3.5   Recent Labs    02/22/17 03/19/17 07/31/17  WBC 4.8 7.8 5.7  HGB 13.2 15.6 12.4  HCT 40 48.6 37.3  MCV  --   --  87.2  PLT 218  --   --    Recent Labs    07/31/17  CHOL 134  LDLCALC 61  TRIG 102   No results found for: [facility] Lab Results  Component Value Date   TSH 0.59 07/31/2017   Lab Results  Component Value Date   HGBA1C 5.4 06/11/2016   Lab Results  Component Value Date   CHOL 134 07/31/2017   HDL 54 06/11/2016   LDLCALC 61 07/31/2017   TRIG 102 07/31/2017    Significant Diagnostic Results in last 30 days:  No results found.  Assessment and Plan  Anxiety Chronic and stable; continue clonazepam 0.25 mg p.o. twice daily  GERD (gastroesophageal reflux disease) Controlled with milk of magnesia 30 cc daily; continue current regimen  Adult failure to thrive Patient continues to have very poor appetite, continues to fail, and family does not appear to be accepting the situation for what it is; continue supportive care    Webb Silversmith D. [name], MD

## 2017-11-28 DIAGNOSIS — R2681 Unsteadiness on feet: Secondary | ICD-10-CM | POA: Diagnosis not present

## 2017-11-28 DIAGNOSIS — Z9181 History of falling: Secondary | ICD-10-CM | POA: Diagnosis not present

## 2017-11-28 DIAGNOSIS — R1312 Dysphagia, oropharyngeal phase: Secondary | ICD-10-CM | POA: Diagnosis not present

## 2017-11-28 DIAGNOSIS — J449 Chronic obstructive pulmonary disease, unspecified: Secondary | ICD-10-CM | POA: Diagnosis not present

## 2017-11-28 DIAGNOSIS — M6281 Muscle weakness (generalized): Secondary | ICD-10-CM | POA: Diagnosis not present

## 2017-11-29 DIAGNOSIS — J449 Chronic obstructive pulmonary disease, unspecified: Secondary | ICD-10-CM | POA: Diagnosis not present

## 2017-11-29 DIAGNOSIS — R1312 Dysphagia, oropharyngeal phase: Secondary | ICD-10-CM | POA: Diagnosis not present

## 2017-11-29 DIAGNOSIS — M6281 Muscle weakness (generalized): Secondary | ICD-10-CM | POA: Diagnosis not present

## 2017-11-29 DIAGNOSIS — R2681 Unsteadiness on feet: Secondary | ICD-10-CM | POA: Diagnosis not present

## 2017-11-29 DIAGNOSIS — Z9181 History of falling: Secondary | ICD-10-CM | POA: Diagnosis not present

## 2017-11-30 DIAGNOSIS — R1312 Dysphagia, oropharyngeal phase: Secondary | ICD-10-CM | POA: Diagnosis not present

## 2017-11-30 DIAGNOSIS — Z9181 History of falling: Secondary | ICD-10-CM | POA: Diagnosis not present

## 2017-11-30 DIAGNOSIS — R2681 Unsteadiness on feet: Secondary | ICD-10-CM | POA: Diagnosis not present

## 2017-11-30 DIAGNOSIS — J449 Chronic obstructive pulmonary disease, unspecified: Secondary | ICD-10-CM | POA: Diagnosis not present

## 2017-11-30 DIAGNOSIS — M6281 Muscle weakness (generalized): Secondary | ICD-10-CM | POA: Diagnosis not present

## 2017-12-02 DIAGNOSIS — Z9181 History of falling: Secondary | ICD-10-CM | POA: Diagnosis not present

## 2017-12-02 DIAGNOSIS — J449 Chronic obstructive pulmonary disease, unspecified: Secondary | ICD-10-CM | POA: Diagnosis not present

## 2017-12-02 DIAGNOSIS — R1312 Dysphagia, oropharyngeal phase: Secondary | ICD-10-CM | POA: Diagnosis not present

## 2017-12-02 DIAGNOSIS — M6281 Muscle weakness (generalized): Secondary | ICD-10-CM | POA: Diagnosis not present

## 2017-12-02 DIAGNOSIS — R2681 Unsteadiness on feet: Secondary | ICD-10-CM | POA: Diagnosis not present

## 2017-12-03 DIAGNOSIS — Z9181 History of falling: Secondary | ICD-10-CM | POA: Diagnosis not present

## 2017-12-03 DIAGNOSIS — R2681 Unsteadiness on feet: Secondary | ICD-10-CM | POA: Diagnosis not present

## 2017-12-03 DIAGNOSIS — R1312 Dysphagia, oropharyngeal phase: Secondary | ICD-10-CM | POA: Diagnosis not present

## 2017-12-03 DIAGNOSIS — M6281 Muscle weakness (generalized): Secondary | ICD-10-CM | POA: Diagnosis not present

## 2017-12-03 DIAGNOSIS — J449 Chronic obstructive pulmonary disease, unspecified: Secondary | ICD-10-CM | POA: Diagnosis not present

## 2017-12-04 DIAGNOSIS — M6281 Muscle weakness (generalized): Secondary | ICD-10-CM | POA: Diagnosis not present

## 2017-12-04 DIAGNOSIS — Z9181 History of falling: Secondary | ICD-10-CM | POA: Diagnosis not present

## 2017-12-04 DIAGNOSIS — R1312 Dysphagia, oropharyngeal phase: Secondary | ICD-10-CM | POA: Diagnosis not present

## 2017-12-04 DIAGNOSIS — J449 Chronic obstructive pulmonary disease, unspecified: Secondary | ICD-10-CM | POA: Diagnosis not present

## 2017-12-04 DIAGNOSIS — R2681 Unsteadiness on feet: Secondary | ICD-10-CM | POA: Diagnosis not present

## 2017-12-05 DIAGNOSIS — Z9181 History of falling: Secondary | ICD-10-CM | POA: Diagnosis not present

## 2017-12-05 DIAGNOSIS — R2681 Unsteadiness on feet: Secondary | ICD-10-CM | POA: Diagnosis not present

## 2017-12-05 DIAGNOSIS — R1312 Dysphagia, oropharyngeal phase: Secondary | ICD-10-CM | POA: Diagnosis not present

## 2017-12-05 DIAGNOSIS — M6281 Muscle weakness (generalized): Secondary | ICD-10-CM | POA: Diagnosis not present

## 2017-12-05 DIAGNOSIS — J449 Chronic obstructive pulmonary disease, unspecified: Secondary | ICD-10-CM | POA: Diagnosis not present

## 2017-12-06 DIAGNOSIS — J449 Chronic obstructive pulmonary disease, unspecified: Secondary | ICD-10-CM | POA: Diagnosis not present

## 2017-12-06 DIAGNOSIS — M6281 Muscle weakness (generalized): Secondary | ICD-10-CM | POA: Diagnosis not present

## 2017-12-06 DIAGNOSIS — Z9181 History of falling: Secondary | ICD-10-CM | POA: Diagnosis not present

## 2017-12-06 DIAGNOSIS — R1312 Dysphagia, oropharyngeal phase: Secondary | ICD-10-CM | POA: Diagnosis not present

## 2017-12-06 DIAGNOSIS — R2681 Unsteadiness on feet: Secondary | ICD-10-CM | POA: Diagnosis not present

## 2017-12-09 DIAGNOSIS — J449 Chronic obstructive pulmonary disease, unspecified: Secondary | ICD-10-CM | POA: Diagnosis not present

## 2017-12-09 DIAGNOSIS — R2681 Unsteadiness on feet: Secondary | ICD-10-CM | POA: Diagnosis not present

## 2017-12-09 DIAGNOSIS — Z9181 History of falling: Secondary | ICD-10-CM | POA: Diagnosis not present

## 2017-12-09 DIAGNOSIS — M6281 Muscle weakness (generalized): Secondary | ICD-10-CM | POA: Diagnosis not present

## 2017-12-09 DIAGNOSIS — R1312 Dysphagia, oropharyngeal phase: Secondary | ICD-10-CM | POA: Diagnosis not present

## 2017-12-10 DIAGNOSIS — Z9181 History of falling: Secondary | ICD-10-CM | POA: Diagnosis not present

## 2017-12-10 DIAGNOSIS — R2681 Unsteadiness on feet: Secondary | ICD-10-CM | POA: Diagnosis not present

## 2017-12-10 DIAGNOSIS — R1312 Dysphagia, oropharyngeal phase: Secondary | ICD-10-CM | POA: Diagnosis not present

## 2017-12-10 DIAGNOSIS — J449 Chronic obstructive pulmonary disease, unspecified: Secondary | ICD-10-CM | POA: Diagnosis not present

## 2017-12-10 DIAGNOSIS — M6281 Muscle weakness (generalized): Secondary | ICD-10-CM | POA: Diagnosis not present

## 2017-12-11 DIAGNOSIS — R1312 Dysphagia, oropharyngeal phase: Secondary | ICD-10-CM | POA: Diagnosis not present

## 2017-12-11 DIAGNOSIS — R2681 Unsteadiness on feet: Secondary | ICD-10-CM | POA: Diagnosis not present

## 2017-12-11 DIAGNOSIS — J449 Chronic obstructive pulmonary disease, unspecified: Secondary | ICD-10-CM | POA: Diagnosis not present

## 2017-12-11 DIAGNOSIS — Z9181 History of falling: Secondary | ICD-10-CM | POA: Diagnosis not present

## 2017-12-11 DIAGNOSIS — M6281 Muscle weakness (generalized): Secondary | ICD-10-CM | POA: Diagnosis not present

## 2017-12-12 DIAGNOSIS — R1312 Dysphagia, oropharyngeal phase: Secondary | ICD-10-CM | POA: Diagnosis not present

## 2017-12-12 DIAGNOSIS — R441 Visual hallucinations: Secondary | ICD-10-CM | POA: Diagnosis not present

## 2017-12-12 DIAGNOSIS — F411 Generalized anxiety disorder: Secondary | ICD-10-CM | POA: Diagnosis not present

## 2017-12-12 DIAGNOSIS — J449 Chronic obstructive pulmonary disease, unspecified: Secondary | ICD-10-CM | POA: Diagnosis not present

## 2017-12-12 DIAGNOSIS — Z9181 History of falling: Secondary | ICD-10-CM | POA: Diagnosis not present

## 2017-12-12 DIAGNOSIS — F0390 Unspecified dementia without behavioral disturbance: Secondary | ICD-10-CM | POA: Diagnosis not present

## 2017-12-12 DIAGNOSIS — M6281 Muscle weakness (generalized): Secondary | ICD-10-CM | POA: Diagnosis not present

## 2017-12-12 DIAGNOSIS — R2681 Unsteadiness on feet: Secondary | ICD-10-CM | POA: Diagnosis not present

## 2017-12-12 DIAGNOSIS — F39 Unspecified mood [affective] disorder: Secondary | ICD-10-CM | POA: Diagnosis not present

## 2017-12-13 ENCOUNTER — Encounter: Payer: Self-pay | Admitting: Internal Medicine

## 2017-12-14 ENCOUNTER — Encounter: Payer: Self-pay | Admitting: Internal Medicine

## 2017-12-14 NOTE — Assessment & Plan Note (Signed)
Controlled with milk of magnesia 30 cc daily; continue current regimen

## 2017-12-14 NOTE — Assessment & Plan Note (Signed)
Chronic and stable; continue clonazepam 0.25 mg p.o. twice daily

## 2017-12-14 NOTE — Assessment & Plan Note (Signed)
Patient continues to have very poor appetite, continues to fail, and family does not appear to be accepting the situation for what it is; continue supportive care

## 2017-12-27 ENCOUNTER — Encounter: Payer: Self-pay | Admitting: Internal Medicine

## 2017-12-27 ENCOUNTER — Non-Acute Institutional Stay (SKILLED_NURSING_FACILITY): Payer: Medicare Other | Admitting: Internal Medicine

## 2017-12-27 DIAGNOSIS — D638 Anemia in other chronic diseases classified elsewhere: Secondary | ICD-10-CM | POA: Diagnosis not present

## 2017-12-27 DIAGNOSIS — H409 Unspecified glaucoma: Secondary | ICD-10-CM

## 2017-12-27 DIAGNOSIS — J449 Chronic obstructive pulmonary disease, unspecified: Secondary | ICD-10-CM | POA: Diagnosis not present

## 2017-12-27 NOTE — Progress Notes (Signed)
Location:  [place] Room Number: 818E Place of Service:  SNF [phone])  Taylor Roth. Taylor Coil, MD  Patient Care Team: Taylor Duos, MD as PCP - General (Internal Medicine) Taylor Ada, MD (Gastroenterology) Taylor Holter, NP (Obstetrics and Gynecology)  Extended Emergency Contact Information Primary Emergency Contact: Taylor Roth,Taylor Roth Address: [place], [place] Phone: [phone] Mobile Phone: [phone] Relation: Daughter Secondary Emergency Contact: Taylor Roth [place] Phone: [phone] Mobile Phone: [phone] Relation: Daughter    Allergies: Amoxicillin; Ampicillin; Depakote [valproic acid]; Macrobid [nitrofurantoin macrocrystal]; Penicillins; Azithromycin; Doxycycline; Escitalopram oxalate; Fioricet [butalbital-apap-caffeine]; Latex; Levofloxacin; Nsaids; Oxycodone; Restasis [cyclosporine]; Sulfa antibiotics; and Biaxin [clarithromycin]  Chief Complaint  Patient presents with  . Medical Management of Chronic Issues    Routine Visit  . Health Maintenance    Influenza and PNA vacc    HPI: [name] who is being seen for routine issues of COPD, anemia, and glaucoma.  Past Medical History:  Diagnosis Date  . Anxiety   . Arterial tortuosity (aorta) 12/11/2012  . Asthma   . Chronic kidney disease, stage IV (severe) ([place]) 12/11/2012  . Chronic mental illness   . CKD (chronic kidney disease), stage III ([place]) 01/14/2015  . Closed hip fracture requiring operative repair, right, with routine healing, subsequent encounter 12/19/2015  . Colitis   . COPD (chronic obstructive pulmonary disease) ([place])   . Decreased rectal sphincter tone 11/21/2012  . Depression   . Drug-seeking behavior 02/26/2011   Use of multiple md to obtain benzos/narcotics pt must sign contract to receive controlled meds.   Marland Kitchen Dysphagia 01/11/2015  . GERD  (gastroesophageal reflux disease)   . Glaucoma 06/26/2011  . Glaucoma of both eyes   . Hearing loss, sensorineural 03/04/2014   [facility]NT; 02/26/14. There is not a medical or surgical treatment that would benefit her type of hearing loss. We discussed amplification in an overview fashion.    Marland Kitchen History of fall   . Insomnia, unspecified   . Macular degeneration 06/26/2011  . Migraines   . Nasal bone fracture 03/30/2014  . Osteoporosis   . Osteoporosis 02/26/2011  . Presence of right artificial hip joint   . Protein-calorie malnutrition, severe ([place]) 12/10/2012  . S/P ORIF (open reduction internal fixation) fracture 01/08/2016  . Schatzki's ring 01/14/2015  . Scoliosis   . Substance abuse ([place])    Benbzos and Narcotics, she does not get any of those medicines from [facility], we have empathetically told her that.  . Vertigo   . Vitamin B deficiency   . Vitamin D deficiency 08/25/2016  . Weight loss     Past Surgical History:  Procedure Laterality Date  . ABDOMINAL HYSTERECTOMY    . CATARACT EXTRACTION    . CHOLECYSTECTOMY    . ESOPHAGOGASTRODUODENOSCOPY (EGD) WITH PROPOFOL N/A 01/13/2015   Procedure: ESOPHAGOGASTRODUODENOSCOPY (EGD) WITH PROPOFOL;  Surgeon: Taylor Horner, MD;  Location: [facility];  Service: Endoscopy;  Laterality: N/A;  . HIP ARTHROPLASTY Right 12/21/2015   Procedure: ARTHROPLASTY BIPOLAR HIP (HEMIARTHROPLASTY);  Surgeon: Taylor Stairs, MD;  Location: [facility];  Service: Orthopedics;  Laterality: Right;  . PARTIAL HYSTERECTOMY      Allergies as of 12/27/2017      Reactions   Amoxicillin    Per daughter   Ampicillin    Per daughter   Depakote [valproic Acid]    DOES  NOT WANT HER TO TAKE IT==Makes her hair fall out= per daughter who is poa   Macrobid [nitrofurantoin Macrocrystal] Rash   Per MAR   Penicillins    Per daughter   Azithromycin Other (See Comments)   Per MAR   Doxycycline Other (See Comments)   Per MAR   Escitalopram Oxalate Other (See  Comments)   Per MAR   Fioricet [butalbital-apap-caffeine] Other (See Comments)   Drunk. Per MAR   Latex Other (See Comments)   Per MAR   Levofloxacin Other (See Comments)   Per MAR   Nsaids Other (See Comments)   This is not an allergy. The patient was told by Dr. Rockne Roth to avoid NSAIDs and stop Vicoprofen in to 2014 to avoid nephrotoxicity. Per MAR.   Oxycodone Other (See Comments)   reaction to synthetic codeine Per MAR   Restasis [cyclosporine] Other (See Comments)   Per MAR   Sulfa Antibiotics Other (See Comments)   Per MAR   Biaxin [clarithromycin] Rash   With burning sensation Per [facility]      Medication List        Accurate as of 12/27/17 11:59 PM. Always use your most recent med list.          acetaminophen 325 MG tablet Commonly known as:  TYLENOL Take 650 mg by mouth every 6 (six) hours as needed for headache.   albuterol 108 (90 Base) MCG/ACT inhaler Commonly known as:  PROVENTIL HFA;VENTOLIN HFA Inhale 1 puff into the lungs every 6 (six) hours as needed for shortness of breath.   Biotin 5000 MCG Caps Take 1 capsule by mouth daily.   CLARITIN 10 MG tablet Generic drug:  loratadine Take 10 mg by mouth daily.   clonazePAM 0.5 MG tablet Commonly known as:  KLONOPIN Take 0.25 mg by mouth 2 (two) times daily.   Cranberry 425 MG Caps Take 425 mg by mouth 2 (two) times daily.   docusate sodium 100 MG capsule Commonly known as:  COLACE Take 100 mg by mouth 2 (two) times daily.   dorzolamide-timolol 22.3-6.8 MG/ML ophthalmic solution Commonly known as:  COSOPT Place 1 drop into both eyes 2 (two) times daily.   ENSURE Take 237 mLs by mouth 4 (four) times daily. D/t weight loss   ferrous sulfate 325 (65 FE) MG tablet Take 325 mg by mouth daily.   fludrocortisone 0.1 MG tablet Commonly known as:  FLORINEF Take 0.1 mg by mouth every morning. for orthostatic hypotension   HYDROcodone-acetaminophen 5-325 MG tablet Commonly known as:  NORCO/VICODIN Take  1 tablet by mouth 3 (three) times daily. At 9 am , 1 pm , and 9 pm for pain   lactulose 10 GM/15ML solution Commonly known as:  CHRONULAC Take 15 mLs (10 g total) by mouth daily as needed for severe constipation.   magnesium hydroxide 400 MG/5ML suspension Commonly known as:  MILK OF MAGNESIA Take 30 mLs by mouth daily as needed for mild constipation.   mirtazapine 15 MG tablet Commonly known as:  REMERON Take 7.5 mg by mouth at bedtime.   nitroGLYCERIN 0.4 MG SL tablet Commonly known as:  NITROSTAT Place 0.4 mg under the tongue every 5 (five) minutes as needed for chest pain. May use 3 times   polyethylene glycol packet Commonly known as:  MIRALAX / GLYCOLAX Take 17 g by mouth daily.   QUEtiapine 300 MG tablet Commonly known as:  SEROQUEL Take 300 mg by mouth at bedtime. Give with a 50 mg tablet to =  350 mg   QUEtiapine 100 MG tablet Commonly known as:  SEROQUEL Take 100 mg by mouth at bedtime.   senna 8.6 MG tablet Commonly known as:  SENOKOT Take 2 tablets by mouth 2 (two) times daily as needed for constipation.   SYSTANE 0.4-0.3 % Soln Generic drug:  Polyethyl Glycol-Propyl Glycol Apply to eye.   travoprost (benzalkonium) 0.004 % ophthalmic solution Commonly known as:  TRAVATAN Place 1 drop into both eyes at bedtime.   vitamin B-12 1000 MCG tablet Commonly known as:  CYANOCOBALAMIN Take 1,000 mcg by mouth daily.   Vitamin D 1000 units capsule Take 1,000 Units by mouth daily with breakfast.   XIIDRA 5 % Soln Generic drug:  Lifitegrast Apply to eye. Place 1 drop into both eyes twice a day       No orders of the defined types were placed in this encounter.   Immunization History  Administered Date(s) Administered  . Influenza Split 11/13/2011  . Influenza-Unspecified 02/13/2008, 11/30/2013, 12/20/2016  . PPD Test 12/15/2015  . Pneumococcal-Unspecified 02/13/2008, 10/16/2016  . Td 02/12/2009    Social History   Tobacco Use  . Smoking status:  Former Smoker    Packs/day: 0.50    Types: Cigarettes  . Smokeless tobacco: Never Used  Substance Use Topics  . Alcohol use: No    Review of Systems  DATA OBTAINED: from patient-limited; nursing- no new concerns GENERAL:  no fevers, fatigue, appetite changes SKIN: No itching, rash HEENT: No complaint RESPIRATORY: No cough, wheezing, SOB CARDIAC: No chest pain, palpitations, lower extremity edema  GI: No abdominal pain, No N/V/D or constipation, No heartburn or reflux  GU: No dysuria, frequency or urgency, or incontinence  MUSCULOSKELETAL: No unrelieved bone/joint pain NEUROLOGIC: No headache, dizziness  PSYCHIATRIC: No overt anxiety or sadness  Vitals:   12/27/17 1313  BP: 98/64  Pulse: 74  Resp: 18  Temp: (!) 97.3 F (36.3 C)  SpO2: 96%   Body mass index is 16.8 kg/m. Physical Exam  GENERAL APPEARANCE: Alert, conversant, No acute distress  SKIN: No diaphoresis rash HEENT: Unremarkable RESPIRATORY: Breathing is even, unlabored. Lung sounds are clear   CARDIOVASCULAR: Heart RRR no murmurs, rubs or gallops. No peripheral edema  GASTROINTESTINAL: Abdomen is soft, non-tender, not distended w/ normal bowel sounds.  GENITOURINARY: Bladder non tender, not distended  MUSCULOSKELETAL: No abnormal joints or musculature NEUROLOGIC: Cranial nerves 2-12 grossly intact. Moves all extremities PSYCHIATRIC: Mood and affect with dementia, some days patient makes sense and some days not, no behavioral issues  Patient Active Problem List   Diagnosis Date Noted  . Abnormal EKG 01/24/2017  . Delusions ([place])   . Paranoia ([place])   . Vitamin D deficiency 08/25/2016  . Dementia with behavioral problem ([place]) 03/25/2016  . S/P ORIF (open reduction internal fixation) fracture 01/08/2016  . Oral thrush 01/08/2016  . HTN (hypertension) 01/08/2016  . Closed hip fracture requiring operative repair, right, with routine healing, subsequent encounter 12/19/2015  . Bipolar disease, chronic ([place])  12/17/2015  . Decubitus ulcer of hip, stage 3 ([place]) 12/11/2015  . Legally blind 08/16/2015  . Ankle pain   . Encephalopathy   . Malnutrition of moderate degree 01/29/2015  . Altered mental status 01/26/2015  . Nausea and vomiting 01/17/2015  . Esophageal dysmotilities   . Urinary tract infectious disease   . Weakness 01/14/2015  . Schatzki's ring 01/14/2015  . CKD (chronic kidney disease), stage III ([place]) 01/14/2015  . Esophagus disorder   . Dysphagia 01/11/2015  . UTI (  lower urinary tract infection) 03/30/2014  . Hearing loss, sensorineural 03/04/2014  . Nasal lesion 03/04/2014  . Constipation 12/12/2012  . Hypokalemia 12/11/2012  . Orthostatic hypotension 12/11/2012  . Arterial tortuosity (aorta) 12/11/2012  . Protein-calorie malnutrition, severe ([place]) 12/10/2012  . Adult failure to thrive 12/09/2012  . Decreased rectal sphincter tone 11/21/2012  . Primary open angle glaucoma of both eyes, severe stage 11/11/2012  . Idiopathic scoliosis 07/08/2012  . Macular atrophy, retinal 06/26/2011  . Vision disturbance 06/26/2011  . Glaucoma 06/26/2011  . Nonexudative senile macular degeneration of retina 06/21/2011  . Macula scar of posterior pole 06/21/2011  . Status post intraocular lens implant 06/21/2011  . Anemia of chronic disease 04/17/2011  . Drug-seeking behavior 02/26/2011  . Osteoporosis 02/26/2011  . Depression   . Anxiety   . COPD (chronic obstructive pulmonary disease) ([place])   . GERD (gastroesophageal reflux disease)   . Migraines   . Insomnia, unspecified     CMP     Component Value Date/Time   NA 140 07/31/2017   K 5.0 07/31/2017   CL 109 12/12/2016 1428   CO2 22 12/12/2016 1428   GLUCOSE 90 12/12/2016 1428   BUN 23 (A) 07/31/2017   CREATININE 0.62 07/31/2017   CALCIUM 8.9 07/31/2017   PROT 5.8 07/31/2017   ALBUMIN 3.5 07/31/2017   AST 14 07/31/2017   ALT 5 07/31/2017   ALKPHOS 87 07/31/2017   BILITOT 0.3 07/31/2017   GFRNONAA 81.51 07/31/2017    GFRNONAA 64 11/09/2013 1135   GFRAA >60 12/12/2016 1428   GFRAA 74 11/09/2013 1135   Recent Labs    02/22/17 07/31/17  NA 143 140  K 4.4 5.0  BUN 17 23*  CREATININE 0.6 0.62  CALCIUM  --  8.9   Recent Labs    02/22/17 07/31/17  AST 16 14  ALT 9 5  ALKPHOS 117 87  BILITOT  --  0.3  PROT  --  5.8  ALBUMIN  --  3.5   Recent Labs    02/22/17 03/19/17 07/31/17  WBC 4.8 7.8 5.7  HGB 13.2 15.6 12.4  HCT 40 48.6 37.3  MCV  --   --  87.2  PLT 218  --   --    Recent Labs    07/31/17  CHOL 134  LDLCALC 61  TRIG 102   No results found for: [facility] Lab Results  Component Value Date   TSH 0.59 07/31/2017   Lab Results  Component Value Date   HGBA1C 5.4 06/11/2016   Lab Results  Component Value Date   CHOL 134 07/31/2017   HDL 54 06/11/2016   LDLCALC 61 07/31/2017   TRIG 102 07/31/2017    Significant Diagnostic Results in last 30 days:  No results found.  Assessment and Plan  COPD (chronic obstructive pulmonary disease) ([place]) Continues without exacerbation; PRN albuterol is available if problems  Anemia of chronic disease Stable; continue iron 325 mg daily and vitamin B12 thousand micrograms daily  Glaucoma Chronic; continue Travatan drops daily and Cosopt drops twice daily     Taylor Epps D. Taylor Coil, MD

## 2017-12-30 ENCOUNTER — Encounter: Payer: Self-pay | Admitting: Internal Medicine

## 2017-12-30 NOTE — Assessment & Plan Note (Signed)
Chronic; continue Travatan drops daily and Cosopt drops twice daily

## 2017-12-30 NOTE — Assessment & Plan Note (Signed)
Continues without exacerbation; PRN albuterol is available if problems

## 2017-12-30 NOTE — Assessment & Plan Note (Signed)
Stable; continue iron 325 mg daily and vitamin B12 thousand micrograms daily

## 2018-01-08 DIAGNOSIS — F0390 Unspecified dementia without behavioral disturbance: Secondary | ICD-10-CM | POA: Diagnosis not present

## 2018-01-08 DIAGNOSIS — F39 Unspecified mood [affective] disorder: Secondary | ICD-10-CM | POA: Diagnosis not present

## 2018-01-08 DIAGNOSIS — R441 Visual hallucinations: Secondary | ICD-10-CM | POA: Diagnosis not present

## 2018-01-14 ENCOUNTER — Non-Acute Institutional Stay (SKILLED_NURSING_FACILITY): Payer: Medicare Other | Admitting: Internal Medicine

## 2018-01-14 DIAGNOSIS — B370 Candidal stomatitis: Secondary | ICD-10-CM

## 2018-01-14 DIAGNOSIS — L853 Xerosis cutis: Secondary | ICD-10-CM

## 2018-01-24 ENCOUNTER — Non-Acute Institutional Stay (SKILLED_NURSING_FACILITY): Payer: Medicare Other | Admitting: Internal Medicine

## 2018-01-24 ENCOUNTER — Encounter: Payer: Self-pay | Admitting: Internal Medicine

## 2018-01-24 DIAGNOSIS — G301 Alzheimer's disease with late onset: Secondary | ICD-10-CM

## 2018-01-24 DIAGNOSIS — F0281 Dementia in other diseases classified elsewhere with behavioral disturbance: Secondary | ICD-10-CM

## 2018-01-24 DIAGNOSIS — I951 Orthostatic hypotension: Secondary | ICD-10-CM

## 2018-01-24 DIAGNOSIS — F02818 Dementia in other diseases classified elsewhere, unspecified severity, with other behavioral disturbance: Secondary | ICD-10-CM

## 2018-01-24 DIAGNOSIS — K5901 Slow transit constipation: Secondary | ICD-10-CM

## 2018-01-24 NOTE — [place] Notes (Signed)
Location:  [place] Room Number: 161W Place of Service:  SNF [phone])  Taylor Roth. [name], MD  Patient Care Team: [name], MD as PCP - General (Internal Medicine) [name], MD (Gastroenterology) [name], NP (Obstetrics and Gynecology)  Extended Emergency Contact Information Primary Emergency Contact: [name],[name] Address: [address] [place], [place] Phone: [phone] Mobile Phone: [phone] Relation: Daughter Secondary Emergency Contact: [name] Phone: [phone] Mobile Phone: [phone] Relation: Daughter    Allergies: Amoxicillin; Ampicillin; Depakote [valproic acid]; Macrobid [nitrofurantoin macrocrystal]; Penicillins; Azithromycin; Doxycycline; Escitalopram oxalate; Fioricet [butalbital-apap-caffeine]; Latex; Levofloxacin; Nsaids; Oxycodone; Restasis [cyclosporine]; Sulfa antibiotics; and Biaxin [clarithromycin]  Chief Complaint  Patient presents with  . Medical Management of Chronic Issues    Routine Visit    HPI: [name] who is being seen for routine issues of orthostatic hypotension, dementia with behaviors, and constipation.  Past Medical History:  Diagnosis Date  . Anxiety   . Arterial tortuosity (aorta) 12/11/2012  . Asthma   . Chronic kidney disease, stage IV (severe) ([place]) 12/11/2012  . Chronic mental illness   . CKD (chronic kidney disease), stage III ([place]) 01/14/2015  . Closed hip fracture requiring operative repair, right, with routine healing, subsequent encounter 12/19/2015  . Colitis   . COPD (chronic obstructive pulmonary disease) ([place])   . Decreased rectal sphincter tone 11/21/2012  . Depression   . Drug-seeking behavior 02/26/2011   Use of multiple md to obtain benzos/narcotics pt must sign contract to receive controlled meds.   Marland Kitchen Dysphagia 01/11/2015  . GERD (gastroesophageal  reflux disease)   . Glaucoma 06/26/2011  . Glaucoma of both eyes   . Hearing loss, sensorineural 03/04/2014   [facility]; 02/26/14. There is not a medical or surgical treatment that would benefit her type of hearing loss. We discussed amplification in an overview fashion.    Marland Kitchen History of fall   . Insomnia, unspecified   . Macular degeneration 06/26/2011  . Migraines   . Nasal bone fracture 03/30/2014  . Osteoporosis   . Osteoporosis 02/26/2011  . Presence of right artificial hip joint   . Protein-calorie malnutrition, severe ([place]) 12/10/2012  . S/P ORIF (open reduction internal fixation) fracture 01/08/2016  . Schatzki's ring 01/14/2015  . Scoliosis   . Substance abuse ([place])    Benbzos and Narcotics, she does not get any of those medicines from [facility], we have empathetically told her that.  . Vertigo   . Vitamin B deficiency   . Vitamin D deficiency 08/25/2016  . Weight loss     Past Surgical History:  Procedure Laterality Date  . ABDOMINAL HYSTERECTOMY    . CATARACT EXTRACTION    . CHOLECYSTECTOMY    . ESOPHAGOGASTRODUODENOSCOPY (EGD) WITH PROPOFOL N/A 01/13/2015   Procedure: ESOPHAGOGASTRODUODENOSCOPY (EGD) WITH PROPOFOL;  Surgeon: [name], MD;  Location: [facility];  Service: Endoscopy;  Laterality: N/A;  . HIP ARTHROPLASTY Right 12/21/2015   Procedure: ARTHROPLASTY BIPOLAR HIP (HEMIARTHROPLASTY);  Surgeon: [name], MD;  Location: [place];  Service: Orthopedics;  Laterality: Right;  . PARTIAL HYSTERECTOMY      Allergies as of 01/24/2018      Reactions   Amoxicillin    Per daughter   Ampicillin    Per daughter   Depakote [valproic Acid]    DOES NOT WANT HER TO TAKE IT==Makes her hair  fall out= per daughter who is poa   Macrobid [nitrofurantoin Macrocrystal] Rash   Per MAR   Penicillins    Per daughter   Azithromycin Other (See Comments)   Per MAR   Doxycycline Other (See Comments)   Per MAR   Escitalopram Oxalate Other (See Comments)   Per MAR    Fioricet [butalbital-apap-caffeine] Other (See Comments)   Drunk. Per MAR   Latex Other (See Comments)   Per MAR   Levofloxacin Other (See Comments)   Per MAR   Nsaids Other (See Comments)   This is not an allergy. The patient was told by Dr. Rockne Menghini to avoid NSAIDs and stop Vicoprofen in to 2014 to avoid nephrotoxicity. Per MAR.   Oxycodone Other (See Comments)   reaction to synthetic codeine Per MAR   Restasis [cyclosporine] Other (See Comments)   Per MAR   Sulfa Antibiotics Other (See Comments)   Per MAR   Biaxin [clarithromycin] Rash   With burning sensation Per [facility]      Medication List       Accurate as of January 24, 2018 11:59 PM. Always use your most recent med list.        acetaminophen 325 MG tablet Commonly known as:  TYLENOL Take 650 mg by mouth every 6 (six) hours as needed for headache.   albuterol 108 (90 Base) MCG/ACT inhaler Commonly known as:  PROVENTIL HFA;VENTOLIN HFA Inhale 1 puff into the lungs every 6 (six) hours as needed for shortness of breath.   Biotin 5000 MCG Caps Take 1 capsule by mouth daily.   CLARITIN 10 MG tablet Generic drug:  loratadine Take 10 mg by mouth daily.   clonazePAM 0.5 MG tablet Commonly known as:  KLONOPIN Take 0.25 mg by mouth 3 (three) times daily as needed.   Cranberry 425 MG Caps Take 425 mg by mouth 2 (two) times daily.   docusate sodium 100 MG capsule Commonly known as:  COLACE Take 100 mg by mouth 2 (two) times daily.   dorzolamide-timolol 22.3-6.8 MG/ML ophthalmic solution Commonly known as:  COSOPT Place 1 drop into both eyes 2 (two) times daily.   ENSURE Take 237 mLs by mouth 4 (four) times daily. D/t weight loss   ferrous sulfate 325 (65 FE) MG tablet Take 325 mg by mouth daily.   fludrocortisone 0.1 MG tablet Commonly known as:  FLORINEF Take 0.1 mg by mouth every morning. for orthostatic hypotension   HYDROcodone-acetaminophen 5-325 MG tablet Commonly known as:  NORCO/VICODIN Take 1  tablet by mouth 3 (three) times daily. At 9 am , 1 pm , and 9 pm for pain   lactulose 10 GM/15ML solution Commonly known as:  CHRONULAC Take 15 mLs (10 g total) by mouth daily as needed for severe constipation.   magnesium hydroxide 400 MG/5ML suspension Commonly known as:  MILK OF MAGNESIA Take 30 mLs by mouth daily as needed for mild constipation.   mirtazapine 15 MG tablet Commonly known as:  REMERON Take 7.5 mg by mouth at bedtime.   nitroGLYCERIN 0.4 MG SL tablet Commonly known as:  NITROSTAT Place 0.4 mg under the tongue every 5 (five) minutes as needed for chest pain. May use 3 times   polyethylene glycol packet Commonly known as:  MIRALAX / GLYCOLAX Take 17 g by mouth daily.   QUEtiapine 300 MG tablet Commonly known as:  SEROQUEL Take 300 mg by mouth at bedtime. Give with a 50 mg tablet to = 350 mg   QUEtiapine 100  MG tablet Commonly known as:  SEROQUEL Take 100 mg by mouth at bedtime.   senna 8.6 MG tablet Commonly known as:  SENOKOT Take 2 tablets by mouth 2 (two) times daily as needed for constipation.   SYSTANE 0.4-0.3 % Soln Generic drug:  Polyethyl Glycol-Propyl Glycol Apply to eye.   travoprost (benzalkonium) 0.004 % ophthalmic solution Commonly known as:  TRAVATAN Place 1 drop into both eyes at bedtime.   vitamin B-12 1000 MCG tablet Commonly known as:  CYANOCOBALAMIN Take 1,000 mcg by mouth daily.   Vitamin D 1000 units capsule Take 1,000 Units by mouth daily with breakfast.   XIIDRA 5 % Soln Generic drug:  Lifitegrast Apply to eye. Place 1 drop into both eyes twice a day       No orders of the defined types were placed in this encounter.   Immunization History  Administered Date(s) Administered  . Influenza Split 11/13/2011  . Influenza-Unspecified 02/13/2008, 11/30/2013, 12/20/2016  . PPD Test 12/15/2015  . Pneumococcal-Unspecified 02/13/2008, 10/16/2016  . Td 02/12/2009    Social History   Tobacco Use  . Smoking status: Former  Smoker    Packs/day: 0.50    Types: Cigarettes  . Smokeless tobacco: Never Used  Substance Use Topics  . Alcohol use: No    Review of Systems  DATA OBTAINED: from patient limited; nursing- no new concerns GENERAL:  no fevers, fatigue, appetite changes SKIN: No itching, rash HEENT: No complaint RESPIRATORY: No cough, wheezing, SOB CARDIAC: No chest pain, palpitations, lower extremity edema  GI: No abdominal pain, No N/V/D or constipation, No heartburn or reflux  GU: No dysuria, frequency or urgency, or incontinence  MUSCULOSKELETAL: No unrelieved bone/joint pain NEUROLOGIC: No headache, dizziness  PSYCHIATRIC: No overt anxiety or sadness  Vitals:   01/24/18 1414  BP: (!) 108/58  Pulse: 86  Resp: 20  Temp: 98 F (36.7 C)   Body mass index is 17.29 kg/m. Physical Exam  GENERAL APPEARANCE: Alert, conversant, No acute distress  SKIN: No diaphoresis rash HEENT: Unremarkable RESPIRATORY: Breathing is even, unlabored. Lung sounds are clear   CARDIOVASCULAR: Heart RRR no murmurs, rubs or gallops. No peripheral edema  GASTROINTESTINAL: Abdomen is soft, non-tender, not distended w/ normal bowel sounds.  GENITOURINARY: Bladder non tender, not distended  MUSCULOSKELETAL: No abnormal joints or musculature NEUROLOGIC: Cranial nerves 2-12 grossly intact. Moves all extremities PSYCHIATRIC: Mood and affect dementia, no behavioral issues  Patient Active Problem List   Diagnosis Date Noted  . Abnormal EKG 01/24/2017  . Delusions ([place])   . Paranoia ([place])   . Vitamin D deficiency 08/25/2016  . Dementia with behavioral problem ([place]) 03/25/2016  . S/P ORIF (open reduction internal fixation) fracture 01/08/2016  . Oral thrush 01/08/2016  . HTN (hypertension) 01/08/2016  . Closed hip fracture requiring operative repair, right, with routine healing, subsequent encounter 12/19/2015  . Bipolar disease, chronic ([place]) 12/17/2015  . Decubitus ulcer of hip, stage 3 ([place]) 12/11/2015  .  Legally blind 08/16/2015  . Ankle pain   . Encephalopathy   . Malnutrition of moderate degree 01/29/2015  . Altered mental status 01/26/2015  . Nausea and vomiting 01/17/2015  . Esophageal dysmotilities   . Urinary tract infectious disease   . Weakness 01/14/2015  . Schatzki's ring 01/14/2015  . CKD (chronic kidney disease), stage III ([place]) 01/14/2015  . Esophagus disorder   . Dysphagia 01/11/2015  . UTI (lower urinary tract infection) 03/30/2014  . Hearing loss, sensorineural 03/04/2014  . Nasal lesion 03/04/2014  .  Constipation 12/12/2012  . Hypokalemia 12/11/2012  . Orthostatic hypotension 12/11/2012  . Arterial tortuosity (aorta) 12/11/2012  . Protein-calorie malnutrition, severe ([place]) 12/10/2012  . Adult failure to thrive 12/09/2012  . Decreased rectal sphincter tone 11/21/2012  . Primary open angle glaucoma of both eyes, severe stage 11/11/2012  . Idiopathic scoliosis 07/08/2012  . Macular atrophy, retinal 06/26/2011  . Vision disturbance 06/26/2011  . Glaucoma 06/26/2011  . Nonexudative senile macular degeneration of retina 06/21/2011  . Macula scar of posterior pole 06/21/2011  . Status post intraocular lens implant 06/21/2011  . Anemia of chronic disease 04/17/2011  . Drug-seeking behavior 02/26/2011  . Osteoporosis 02/26/2011  . Depression   . Anxiety   . COPD (chronic obstructive pulmonary disease) ([place])   . GERD (gastroesophageal reflux disease)   . Migraines   . Insomnia, unspecified     CMP     Component Value Date/Time   NA 140 07/31/2017   K 5.0 07/31/2017   CL 109 12/12/2016 1428   CO2 22 12/12/2016 1428   GLUCOSE 90 12/12/2016 1428   BUN 23 (A) 07/31/2017   CREATININE 0.62 07/31/2017   CALCIUM 8.9 07/31/2017   PROT 5.8 07/31/2017   ALBUMIN 3.5 07/31/2017   AST 14 07/31/2017   ALT 5 07/31/2017   ALKPHOS 87 07/31/2017   BILITOT 0.3 07/31/2017   GFRNONAA 81.51 07/31/2017   GFRNONAA 64 11/09/2013 1135   GFRAA >60 12/12/2016 1428   GFRAA 74  11/09/2013 1135   Recent Labs    02/22/17 07/31/17  NA 143 140  K 4.4 5.0  BUN 17 23*  CREATININE 0.6 0.62  CALCIUM  --  8.9   Recent Labs    02/22/17 07/31/17  AST 16 14  ALT 9 5  ALKPHOS 117 87  BILITOT  --  0.3  PROT  --  5.8  ALBUMIN  --  3.5   Recent Labs    02/22/17 03/19/17 07/31/17  WBC 4.8 7.8 5.7  HGB 13.2 15.6 12.4  HCT 40 48.6 37.3  MCV  --   --  87.2  PLT 218  --   --    Recent Labs    07/31/17  CHOL 134  LDLCALC 61  TRIG 102   No results found for: [facility] Lab Results  Component Value Date   TSH 0.59 07/31/2017   Lab Results  Component Value Date   HGBA1C 5.4 06/11/2016   Lab Results  Component Value Date   CHOL 134 07/31/2017   HDL 54 06/11/2016   LDLCALC 61 07/31/2017   TRIG 102 07/31/2017    Significant Diagnostic Results in last 30 days:  No results found.  Assessment and Plan  Orthostatic hypotension Acceptable; continue Florinef 0.1 mg every morningg  Dementia with behavioral problem (HCC) Chronic and progressive; continue supportive care  Constipation No recent complaints; continue lactulose 17 g daily, Colace 100 mg twice daily, Chronulac 10 g daily as needed and senna twice daily as needed     Webb Silversmith D. [name], MD

## 2018-01-25 ENCOUNTER — Encounter: Payer: Self-pay | Admitting: Internal Medicine

## 2018-01-25 NOTE — Progress Notes (Signed)
Location:  [place] of Service:  SNF (31)  [name], MD  Patient Care Team: [name], MD as PCP - General (Internal Medicine) [name], MD (Gastroenterology) [name], NP (Obstetrics and Gynecology)  Extended Emergency Contact Information Primary Emergency Contact: [name],[name] Address: [place], [place] Phone: [phone] Mobile Phone: [phone] Relation: Daughter Secondary Emergency Contact: [name] Phone: [phone] Mobile Phone: [phone] Relation: Daughter    Allergies: Amoxicillin; Ampicillin; Depakote [valproic acid]; Macrobid [nitrofurantoin macrocrystal]; Penicillins; Azithromycin; Doxycycline; Escitalopram oxalate; Fioricet [butalbital-apap-caffeine]; Latex; Levofloxacin; Nsaids; Oxycodone; Restasis [cyclosporine]; Sulfa antibiotics; and Biaxin [clarithromycin]  Chief Complaint  Patient presents with  . Acute Visit    HPI: [name] who is being seen for a rash on face and around her eyes noted by patient daughter.  They do say that the patient rubs her eyes and her face and her mouth almost constantly, which I have witnessed myself.  Today patient's eyes are red, but it is actually the skin around the eyes that is red, and the actual conjunctiva is noninjected.  Patient does admit to mouth pain upon examination patient is found to have thrush.  No fever, no cold symptoms no cough.  Past Medical History:  Diagnosis Date  . Anxiety   . Arterial tortuosity (aorta) 12/11/2012  . Asthma   . Chronic kidney disease, stage IV (severe) ([place]) 12/11/2012  . Chronic mental illness   . CKD (chronic kidney disease), stage III ([place]) 01/14/2015  . Closed hip fracture requiring operative repair, right, with routine healing, subsequent encounter 12/19/2015  . Colitis   . COPD (chronic obstructive  pulmonary disease) ([place])   . Decreased rectal sphincter tone 11/21/2012  . Depression   . Drug-seeking behavior 02/26/2011   Use of multiple md to obtain benzos/narcotics pt must sign contract to receive controlled meds.   Marland Kitchen Dysphagia 01/11/2015  . GERD (gastroesophageal reflux disease)   . Glaucoma 06/26/2011  . Glaucoma of both eyes   . Hearing loss, sensorineural 03/04/2014   [facility]; 02/26/14. There is not a medical or surgical treatment that would benefit her type of hearing loss. We discussed amplification in an overview fashion.    Marland Kitchen History of fall   . Insomnia, unspecified   . Macular degeneration 06/26/2011  . Migraines   . Nasal bone fracture 03/30/2014  . Osteoporosis   . Osteoporosis 02/26/2011  . Presence of right artificial hip joint   . Protein-calorie malnutrition, severe ([place]) 12/10/2012  . S/P ORIF (open reduction internal fixation) fracture 01/08/2016  . Schatzki's ring 01/14/2015  . Scoliosis   . Substance abuse ([place])    Benbzos and Narcotics, she does not get any of those medicines from [facility], we have empathetically told her that.  . Vertigo   . Vitamin B deficiency   . Vitamin D deficiency 08/25/2016  . Weight loss     Past Surgical History:  Procedure Laterality Date  . ABDOMINAL HYSTERECTOMY    . CATARACT EXTRACTION    . CHOLECYSTECTOMY    . ESOPHAGOGASTRODUODENOSCOPY (EGD) WITH PROPOFOL N/A 01/13/2015   Procedure: ESOPHAGOGASTRODUODENOSCOPY (EGD) WITH PROPOFOL;  Surgeon: [name], MD;  Location: [facility];  Service: Endoscopy;  Laterality: N/A;  . HIP ARTHROPLASTY Right 12/21/2015   Procedure: ARTHROPLASTY BIPOLAR HIP (HEMIARTHROPLASTY);  Surgeon: [name], MD;  Location:  [place] OR;  Service: Orthopedics;  Laterality: Right;  . PARTIAL HYSTERECTOMY      Allergies as of 01/14/2018      Reactions   Amoxicillin    Per daughter   Ampicillin    Per daughter   Depakote [valproic Acid]    DOES NOT WANT HER TO TAKE IT==Makes her hair fall  out= per daughter who is poa   Macrobid [nitrofurantoin Macrocrystal] Rash   Per MAR   Penicillins    Per daughter   Azithromycin Other (See Comments)   Per MAR   Doxycycline Other (See Comments)   Per MAR   Escitalopram Oxalate Other (See Comments)   Per MAR   Fioricet [butalbital-apap-caffeine] Other (See Comments)   Drunk. Per MAR   Latex Other (See Comments)   Per MAR   Levofloxacin Other (See Comments)   Per MAR   Nsaids Other (See Comments)   This is not an allergy. The patient was told by Dr. Rockne Menghini to avoid NSAIDs and stop Vicoprofen in to 2014 to avoid nephrotoxicity. Per MAR.   Oxycodone Other (See Comments)   reaction to synthetic codeine Per MAR   Restasis [cyclosporine] Other (See Comments)   Per MAR   Sulfa Antibiotics Other (See Comments)   Per MAR   Biaxin [clarithromycin] Rash   With burning sensation Per [facility]      Medication List       Accurate as of January 14, 2018 11:59 PM. Always use your most recent med list.        acetaminophen 325 MG tablet Commonly known as:  TYLENOL Take 650 mg by mouth every 6 (six) hours as needed for headache.   albuterol 108 (90 Base) MCG/ACT inhaler Commonly known as:  PROVENTIL HFA;VENTOLIN HFA Inhale 1 puff into the lungs every 6 (six) hours as needed for shortness of breath.   Biotin 5000 MCG Caps Take 1 capsule by mouth daily.   CLARITIN 10 MG tablet Generic drug:  loratadine Take 10 mg by mouth daily.   clonazePAM 0.5 MG tablet Commonly known as:  KLONOPIN Take 0.25 mg by mouth 3 (three) times daily as needed.   Cranberry 425 MG Caps Take 425 mg by mouth 2 (two) times daily.   docusate sodium 100 MG capsule Commonly known as:  COLACE Take 100 mg by mouth 2 (two) times daily.   dorzolamide-timolol 22.3-6.8 MG/ML ophthalmic solution Commonly known as:  COSOPT Place 1 drop into both eyes 2 (two) times daily.   ENSURE Take 237 mLs by mouth 4 (four) times daily. D/t weight loss   ferrous sulfate  325 (65 FE) MG tablet Take 325 mg by mouth daily.   fludrocortisone 0.1 MG tablet Commonly known as:  FLORINEF Take 0.1 mg by mouth every morning. for orthostatic hypotension   HYDROcodone-acetaminophen 5-325 MG tablet Commonly known as:  NORCO/VICODIN Take 1 tablet by mouth 3 (three) times daily. At 9 am , 1 pm , and 9 pm for pain   lactulose 10 GM/15ML solution Commonly known as:  CHRONULAC Take 15 mLs (10 g total) by mouth daily as needed for severe constipation.   magnesium hydroxide 400 MG/5ML suspension Commonly known as:  MILK OF MAGNESIA Take 30 mLs by mouth daily as needed for mild constipation.   mirtazapine 15 MG tablet Commonly known as:  REMERON Take 7.5 mg by mouth at bedtime.   nitroGLYCERIN 0.4 MG SL tablet Commonly known as:  NITROSTAT Place 0.4 mg under the tongue every 5 (five)  minutes as needed for chest pain. May use 3 times   polyethylene glycol packet Commonly known as:  MIRALAX / GLYCOLAX Take 17 g by mouth daily.   QUEtiapine 300 MG tablet Commonly known as:  SEROQUEL Take 300 mg by mouth at bedtime. Give with a 50 mg tablet to = 350 mg   QUEtiapine 100 MG tablet Commonly known as:  SEROQUEL Take 100 mg by mouth at bedtime.   senna 8.6 MG tablet Commonly known as:  SENOKOT Take 2 tablets by mouth 2 (two) times daily as needed for constipation.   SYSTANE 0.4-0.3 % Soln Generic drug:  Polyethyl Glycol-Propyl Glycol Apply to eye.   travoprost (benzalkonium) 0.004 % ophthalmic solution Commonly known as:  TRAVATAN Place 1 drop into both eyes at bedtime.   vitamin B-12 1000 MCG tablet Commonly known as:  CYANOCOBALAMIN Take 1,000 mcg by mouth daily.   Vitamin D 1000 units capsule Take 1,000 Units by mouth daily with breakfast.   XIIDRA 5 % Soln Generic drug:  Lifitegrast Apply to eye. Place 1 drop into both eyes twice a day       No orders of the defined types were placed in this encounter.   Immunization History  Administered  Date(s) Administered  . Influenza Split 11/13/2011  . Influenza-Unspecified 02/13/2008, 11/30/2013, 12/20/2016  . PPD Test 12/15/2015  . Pneumococcal-Unspecified 02/13/2008, 10/16/2016  . Td 02/12/2009    Social History   Tobacco Use  . Smoking status: Former Smoker    Packs/day: 0.50    Types: Cigarettes  . Smokeless tobacco: Never Used  Substance Use Topics  . Alcohol use: No    Review of Systems  DATA OBTAINED: from patient, nurse, family member GENERAL:  no fevers, fatigue, appetite changes SKIN:  Rash from both eyes, patient has refused her eyedrops because she admits that when the drop hits her skin it burns HEENT: Sore mouth RESPIRATORY: No cough, wheezing, SOB CARDIAC: No chest pain, palpitations, lower extremity edema  GI: No abdominal pain, No N/V/D or constipation, No heartburn or reflux  GU: No dysuria, frequency or urgency, or incontinence  MUSCULOSKELETAL: No unrelieved bone/joint pain NEUROLOGIC: No headache, dizziness  PSYCHIATRIC: No overt anxiety or sadness  Vitals:   01/25/18 2331  BP: 104/68  Pulse: 68  Resp: 18  Temp: (!) 97 F (36.1 C)   Body mass index is 17.29 kg/m. Physical Exam  GENERAL APPEARANCE: Alert, conversant, No acute distress  SKIN: Dark pinkish scaly rash concentrically around both eyeballs on upper and lower lids HEENT: White plaque on tongue that does not scrape off RESPIRATORY: Breathing is even, unlabored. Lung sounds are clear   CARDIOVASCULAR: Heart RRR no murmurs, rubs or gallops. No peripheral edema  GASTROINTESTINAL: Abdomen is soft, non-tender, not distended w/ normal bowel sounds.  GENITOURINARY: Bladder non tender, not distended  MUSCULOSKELETAL: No abnormal joints or musculature NEUROLOGIC: Cranial nerves 2-12 grossly intact. Moves all extremities PSYCHIATRIC: Mood and affect dementia, no behavioral issues  Patient Active Problem List   Diagnosis Date Noted  . Abnormal EKG 01/24/2017  . Delusions ([place])   .  Paranoia ([place])   . Vitamin D deficiency 08/25/2016  . Dementia with behavioral problem ([place]) 03/25/2016  . S/P ORIF (open reduction internal fixation) fracture 01/08/2016  . Oral thrush 01/08/2016  . HTN (hypertension) 01/08/2016  . Closed hip fracture requiring operative repair, right, with routine healing, subsequent encounter 12/19/2015  . Bipolar disease, chronic ([place]) 12/17/2015  . Decubitus ulcer of hip, stage 3 ([facility])  12/11/2015  . Legally blind 08/16/2015  . Ankle pain   . Encephalopathy   . Malnutrition of moderate degree 01/29/2015  . Altered mental status 01/26/2015  . Nausea and vomiting 01/17/2015  . Esophageal dysmotilities   . Urinary tract infectious disease   . Weakness 01/14/2015  . Schatzki's ring 01/14/2015  . CKD (chronic kidney disease), stage III ([place]) 01/14/2015  . Esophagus disorder   . Dysphagia 01/11/2015  . UTI (lower urinary tract infection) 03/30/2014  . Hearing loss, sensorineural 03/04/2014  . Nasal lesion 03/04/2014  . Constipation 12/12/2012  . Hypokalemia 12/11/2012  . Orthostatic hypotension 12/11/2012  . Arterial tortuosity (aorta) 12/11/2012  . Protein-calorie malnutrition, severe ([place]) 12/10/2012  . Adult failure to thrive 12/09/2012  . Decreased rectal sphincter tone 11/21/2012  . Primary open angle glaucoma of both eyes, severe stage 11/11/2012  . Idiopathic scoliosis 07/08/2012  . Macular atrophy, retinal 06/26/2011  . Vision disturbance 06/26/2011  . Glaucoma 06/26/2011  . Nonexudative senile macular degeneration of retina 06/21/2011  . Macula scar of posterior pole 06/21/2011  . Status post intraocular lens implant 06/21/2011  . Anemia of chronic disease 04/17/2011  . Drug-seeking behavior 02/26/2011  . Osteoporosis 02/26/2011  . Depression   . Anxiety   . COPD (chronic obstructive pulmonary disease) ([place])   . GERD (gastroesophageal reflux disease)   . Migraines   . Insomnia, unspecified     CMP     Component Value  Date/Time   NA 140 07/31/2017   K 5.0 07/31/2017   CL 109 12/12/2016 1428   CO2 22 12/12/2016 1428   GLUCOSE 90 12/12/2016 1428   BUN 23 (A) 07/31/2017   CREATININE 0.62 07/31/2017   CALCIUM 8.9 07/31/2017   PROT 5.8 07/31/2017   ALBUMIN 3.5 07/31/2017   AST 14 07/31/2017   ALT 5 07/31/2017   ALKPHOS 87 07/31/2017   BILITOT 0.3 07/31/2017   GFRNONAA 81.51 07/31/2017   GFRNONAA 64 11/09/2013 1135   GFRAA >60 12/12/2016 1428   GFRAA 74 11/09/2013 1135   Recent Labs    02/22/17 07/31/17  NA 143 140  K 4.4 5.0  BUN 17 23*  CREATININE 0.6 0.62  CALCIUM  --  8.9   Recent Labs    02/22/17 07/31/17  AST 16 14  ALT 9 5  ALKPHOS 117 87  BILITOT  --  0.3  PROT  --  5.8  ALBUMIN  --  3.5   Recent Labs    02/22/17 03/19/17 07/31/17  WBC 4.8 7.8 5.7  HGB 13.2 15.6 12.4  HCT 40 48.6 37.3  MCV  --   --  87.2  PLT 218  --   --    Recent Labs    07/31/17  CHOL 134  LDLCALC 61  TRIG 102   No results found for: [facility] Lab Results  Component Value Date   TSH 0.59 07/31/2017   Lab Results  Component Value Date   HGBA1C 5.4 06/11/2016   Lab Results  Component Value Date   CHOL 134 07/31/2017   HDL 54 06/11/2016   LDLCALC 61 07/31/2017   TRIG 102 07/31/2017    Significant Diagnostic Results in last 30 days:  No results found.  Assessment and Plan  Xerosis around eyes- mostly from patient constantly rubbing any and all parts of her face; because it is her eyes cannot use much in the way of lotions so will be using prednisone short taper 40/40/40/10/10  Thrush- will be using Diflucan 100 mg  2 p.o. day 1 and 100 mg daily for 14 days     Inocencio Homes, MD

## 2018-02-01 ENCOUNTER — Encounter: Payer: Self-pay | Admitting: Internal Medicine

## 2018-02-01 NOTE — Assessment & Plan Note (Signed)
Acceptable; continue Florinef 0.1 mg every morningg

## 2018-02-01 NOTE — Assessment & Plan Note (Signed)
No recent complaints; continue lactulose 17 g daily, Colace 100 mg twice daily, Chronulac 10 g daily as needed and senna twice daily as needed

## 2018-02-01 NOTE — Assessment & Plan Note (Signed)
Chronic and progressive; continue supportive care

## 2018-02-03 DIAGNOSIS — F39 Unspecified mood [affective] disorder: Secondary | ICD-10-CM | POA: Diagnosis not present

## 2018-02-03 DIAGNOSIS — F411 Generalized anxiety disorder: Secondary | ICD-10-CM | POA: Diagnosis not present

## 2018-02-03 DIAGNOSIS — R440 Auditory hallucinations: Secondary | ICD-10-CM | POA: Diagnosis not present

## 2018-02-03 DIAGNOSIS — F0390 Unspecified dementia without behavioral disturbance: Secondary | ICD-10-CM | POA: Diagnosis not present

## 2018-02-06 ENCOUNTER — Other Ambulatory Visit: Payer: Self-pay | Admitting: Internal Medicine

## 2018-02-06 MED ORDER — CLONAZEPAM 0.5 MG PO TABS: 0.2500 mg | ORAL_TABLET | Freq: Three times a day (TID) | ORAL | 0 refills | Status: DC

## 2018-02-11 ENCOUNTER — Other Ambulatory Visit: Payer: Self-pay

## 2018-02-13 MED ORDER — HYDROCODONE-ACETAMINOPHEN 5-325 MG PO TABS: 1.0000 | ORAL_TABLET | Freq: Every day | ORAL | 0 refills | Status: DC

## 2018-02-25 ENCOUNTER — Encounter: Payer: Self-pay | Admitting: Internal Medicine

## 2018-02-25 NOTE — Progress Notes (Signed)
Location:  [place] Room Number: 818E Place of Service:  SNF [phone])  Taylor Roth. Taylor Coil, MD  Patient Care Team: [name], MD as PCP - General (Internal Medicine) [name], MD (Gastroenterology) [name], NP (Obstetrics and Gynecology)  Extended Emergency Contact Information Primary Emergency Contact: [name],[name] Address: [place], [place] Phone: [phone] Mobile Phone: [phone] Relation: Daughter Secondary Emergency Contact: [name] Phone: [phone] Mobile Phone: [phone] Relation: Daughter    Allergies: Amoxicillin; Ampicillin; Depakote [valproic acid]; Macrobid [nitrofurantoin macrocrystal]; Penicillins; Azithromycin; Doxycycline; Escitalopram oxalate; Fioricet [butalbital-apap-caffeine]; Latex; Levofloxacin; Nsaids; Oxycodone; Restasis [cyclosporine]; Sulfa antibiotics; and Biaxin [clarithromycin]  Chief Complaint  Patient presents with  . Medical Management of Chronic Issues    Routine Visit    HPI: Patient is 85 y.o. female who is being seen for routine issues of bipolar disease, vitamin D deficiency, and dry syndrome.  Past Medical History:  Diagnosis Date  . Anxiety   . Arterial tortuosity (aorta) 12/11/2012  . Asthma   . Chronic kidney disease, stage IV (severe) ([place]) 12/11/2012  . Chronic mental illness   . CKD (chronic kidney disease), stage III ([place]) 01/14/2015  . Closed hip fracture requiring operative repair, right, with routine healing, subsequent encounter 12/19/2015  . Colitis   . COPD (chronic obstructive pulmonary disease) ([place])   . Decreased rectal sphincter tone 11/21/2012  . Depression   . Drug-seeking behavior 02/26/2011   Use of multiple md to obtain benzos/narcotics pt must sign contract to receive controlled meds.   Marland Kitchen Dysphagia 01/11/2015  . GERD (gastroesophageal reflux  disease)   . Glaucoma 06/26/2011  . Glaucoma of both eyes   . Hearing loss, sensorineural 03/04/2014   [facility]; 02/26/14. There is not a medical or surgical treatment that would benefit her type of hearing loss. We discussed amplification in an overview fashion.    Marland Kitchen History of fall   . Insomnia, unspecified   . Macular degeneration 06/26/2011  . Migraines   . Nasal bone fracture 03/30/2014  . Osteoporosis   . Osteoporosis 02/26/2011  . Presence of right artificial hip joint   . Protein-calorie malnutrition, severe ([place]) 12/10/2012  . S/P ORIF (open reduction internal fixation) fracture 01/08/2016  . Schatzki's ring 01/14/2015  . Scoliosis   . Substance abuse ([place])    Benbzos and Narcotics, she does not get any of those medicines from [facility], we have empathetically told her that.  . Vertigo   . Vitamin B deficiency   . Vitamin D deficiency 08/25/2016  . Weight loss     Past Surgical History:  Procedure Laterality Date  . ABDOMINAL HYSTERECTOMY    . CATARACT EXTRACTION    . CHOLECYSTECTOMY    . ESOPHAGOGASTRODUODENOSCOPY (EGD) WITH PROPOFOL N/A 01/13/2015   Procedure: ESOPHAGOGASTRODUODENOSCOPY (EGD) WITH PROPOFOL;  Surgeon: [name], MD;  Location: [facility];  Service: Endoscopy;  Laterality: N/A;  . HIP ARTHROPLASTY Right 12/21/2015   Procedure: ARTHROPLASTY BIPOLAR HIP (HEMIARTHROPLASTY);  Surgeon: [name], MD;  Location: [place];  Service: Orthopedics;  Laterality: Right;  . PARTIAL HYSTERECTOMY      Allergies as of 02/26/2018      Reactions   Amoxicillin    Per daughter   Ampicillin    Per daughter   Depakote [valproic Acid]    DOES NOT WANT HER TO TAKE IT==Makes her  hair fall out= per daughter who is poa   Macrobid [nitrofurantoin Macrocrystal] Rash   Per MAR   Penicillins    Per daughter   Azithromycin Other (See Comments)   Per MAR   Doxycycline Other (See Comments)   Per MAR   Escitalopram Oxalate Other (See Comments)   Per MAR   Fioricet  [butalbital-apap-caffeine] Other (See Comments)   Drunk. Per MAR   Latex Other (See Comments)   Per MAR   Levofloxacin Other (See Comments)   Per MAR   Nsaids Other (See Comments)   This is not an allergy. The patient was told by Dr. Rockne Menghini to avoid NSAIDs and stop Vicoprofen in to 2014 to avoid nephrotoxicity. Per MAR.   Oxycodone Other (See Comments)   reaction to synthetic codeine Per MAR   Restasis [cyclosporine] Other (See Comments)   Per MAR   Sulfa Antibiotics Other (See Comments)   Per MAR   Biaxin [clarithromycin] Rash   With burning sensation Per [facility]      Medication List       Accurate as of February 26, 2018 11:59 PM. Always use your most recent med list.        acetaminophen 325 MG tablet Commonly known as:  TYLENOL Take 650 mg by mouth every 6 (six) hours as needed for headache.   albuterol 108 (90 Base) MCG/ACT inhaler Commonly known as:  PROVENTIL HFA;VENTOLIN HFA Inhale 1 puff into the lungs every 6 (six) hours as needed for shortness of breath.   Biotin 5000 MCG Caps Take 1 capsule by mouth daily.   CLARITIN 10 MG tablet Generic drug:  loratadine Take 10 mg by mouth daily.   clonazePAM 0.5 MG tablet Commonly known as:  KLONOPIN Take 0.5 tablets (0.25 mg total) by mouth 3 (three) times daily.   Cranberry 425 MG Caps Take 425 mg by mouth 2 (two) times daily.   docusate sodium 100 MG capsule Commonly known as:  COLACE Take 100 mg by mouth 2 (two) times daily.   dorzolamide-timolol 22.3-6.8 MG/ML ophthalmic solution Commonly known as:  COSOPT Place 1 drop into both eyes 2 (two) times daily.   ENSURE Take 237 mLs by mouth 4 (four) times daily. D/t weight loss   ferrous sulfate 325 (65 FE) MG tablet Take 325 mg by mouth daily.   fludrocortisone 0.1 MG tablet Commonly known as:  FLORINEF Take 0.1 mg by mouth every morning. for orthostatic hypotension   HYDROcodone-acetaminophen 5-325 MG tablet Commonly known as:  NORCO/VICODIN Take 1  tablet by mouth at bedtime. At 9 am , 1 pm , and 9 pm for pain   lactulose 10 GM/15ML solution Commonly known as:  CHRONULAC Take 15 mLs (10 g total) by mouth daily as needed for severe constipation.   magnesium hydroxide 400 MG/5ML suspension Commonly known as:  MILK OF MAGNESIA Take 30 mLs by mouth daily as needed for mild constipation.   mirtazapine 15 MG tablet Commonly known as:  REMERON Take 7.5 mg by mouth at bedtime.   nitroGLYCERIN 0.4 MG SL tablet Commonly known as:  NITROSTAT Place 0.4 mg under the tongue every 5 (five) minutes as needed for chest pain. May use 3 times   polyethylene glycol packet Commonly known as:  MIRALAX / GLYCOLAX Take 17 g by mouth daily.   QUEtiapine 300 MG tablet Commonly known as:  SEROQUEL Take 300 mg by mouth at bedtime. Give with a 50 mg tablet to = 350 mg   QUEtiapine 100  MG tablet Commonly known as:  SEROQUEL Take 100 mg by mouth at bedtime.   senna 8.6 MG tablet Commonly known as:  SENOKOT Take 2 tablets by mouth 2 (two) times daily as needed for constipation.   SYSTANE 0.4-0.3 % Soln Generic drug:  Polyethyl Glycol-Propyl Glycol Apply to eye.   travoprost (benzalkonium) 0.004 % ophthalmic solution Commonly known as:  TRAVATAN Place 1 drop into both eyes at bedtime.   vitamin B-12 1000 MCG tablet Commonly known as:  CYANOCOBALAMIN Take 1,000 mcg by mouth daily.   Vitamin D 1000 units capsule Take 1,000 Units by mouth daily with breakfast.   XIIDRA 5 % Soln Generic drug:  Lifitegrast Apply to eye. Place 1 drop into both eyes twice a day       No orders of the defined types were placed in this encounter.   Immunization History  Administered Date(s) Administered  . Influenza Split 11/13/2011  . Influenza-Unspecified 02/13/2008, 11/30/2013, 12/20/2016  . PPD Test 12/15/2015  . Pneumococcal-Unspecified 02/13/2008, 10/16/2016  . Td 02/12/2009    Social History   Tobacco Use  . Smoking status: Former Smoker     Packs/day: 0.50    Types: Cigarettes  . Smokeless tobacco: Never Used  Substance Use Topics  . Alcohol use: No    Review of Systems  DATA OBTAINED: from nurse GENERAL:  no fevers, fatigue, appetite changes SKIN: No itching, rash HEENT: No complaint RESPIRATORY: No cough, wheezing, SOB CARDIAC: No chest pain, palpitations, lower extremity edema  GI: No abdominal pain, No N/V/D or constipation, No heartburn or reflux  GU: No dysuria, frequency or urgency, or incontinence  MUSCULOSKELETAL: No unrelieved bone/joint pain NEUROLOGIC: No headache, dizziness  PSYCHIATRIC: No overt anxiety or sadness  Vitals:   02/25/18 1443  BP: (!) 84/58  Pulse: 68  Resp: 18  Temp: (!) 97.1 F (36.2 C)   Body mass index is 17.17 kg/m. Physical Exam  GENERAL APPEARANCE: Alert, conversant, No acute distress  SKIN: No diaphoresis rash HEENT: Unremarkable RESPIRATORY: Breathing is even, unlabored. Lung sounds are clear   CARDIOVASCULAR: Heart RRR no murmurs, rubs or gallops. No peripheral edema  GASTROINTESTINAL: Abdomen is soft, non-tender, not distended w/ normal bowel sounds.  GENITOURINARY: Bladder non tender, not distended  MUSCULOSKELETAL: No abnormal joints or musculature except very frail NEUROLOGIC: Cranial nerves 2-12 grossly intact. Moves all extremities PSYCHIATRIC: Mood and affect with dementia, no behavioral issues  Patient Active Problem List   Diagnosis Date Noted  . Dry eye syndrome of both eyes 02/28/2018  . Abnormal EKG 01/24/2017  . Delusions ([place])   . Paranoia ([place])   . Vitamin D deficiency 08/25/2016  . Dementia with behavioral problem ([place]) 03/25/2016  . S/P ORIF (open reduction internal fixation) fracture 01/08/2016  . Oral thrush 01/08/2016  . HTN (hypertension) 01/08/2016  . Closed hip fracture requiring operative repair, right, with routine healing, subsequent encounter 12/19/2015  . Bipolar disease, chronic ([place]) 12/17/2015  . Decubitus ulcer of hip, stage 3  ([place]) 12/11/2015  . Legally blind 08/16/2015  . Ankle pain   . Encephalopathy   . Malnutrition of moderate degree 01/29/2015  . Altered mental status 01/26/2015  . Nausea and vomiting 01/17/2015  . Esophageal dysmotilities   . Urinary tract infectious disease   . Weakness 01/14/2015  . Schatzki's ring 01/14/2015  . CKD (chronic kidney disease), stage III ([place]) 01/14/2015  . Esophagus disorder   . Dysphagia 01/11/2015  . UTI (lower urinary tract infection) 03/30/2014  . Hearing loss,  sensorineural 03/04/2014  . Nasal lesion 03/04/2014  . Constipation 12/12/2012  . Hypokalemia 12/11/2012  . Orthostatic hypotension 12/11/2012  . Arterial tortuosity (aorta) 12/11/2012  . Protein-calorie malnutrition, severe ([place]) 12/10/2012  . Adult failure to thrive 12/09/2012  . Decreased rectal sphincter tone 11/21/2012  . Primary open angle glaucoma of both eyes, severe stage 11/11/2012  . Idiopathic scoliosis 07/08/2012  . Macular atrophy, retinal 06/26/2011  . Vision disturbance 06/26/2011  . Glaucoma 06/26/2011  . Nonexudative senile macular degeneration of retina 06/21/2011  . Macula scar of posterior pole 06/21/2011  . Status post intraocular lens implant 06/21/2011  . Anemia of chronic disease 04/17/2011  . Drug-seeking behavior 02/26/2011  . Osteoporosis 02/26/2011  . Depression   . Anxiety   . COPD (chronic obstructive pulmonary disease) ([place])   . GERD (gastroesophageal reflux disease)   . Migraines   . Insomnia, unspecified     CMP     Component Value Date/Time   NA 140 07/31/2017   K 5.0 07/31/2017   CL 109 12/12/2016 1428   CO2 22 12/12/2016 1428   GLUCOSE 90 12/12/2016 1428   BUN 23 (A) 07/31/2017   CREATININE 0.62 07/31/2017   CALCIUM 8.9 07/31/2017   PROT 5.8 07/31/2017   ALBUMIN 3.5 07/31/2017   AST 14 07/31/2017   ALT 5 07/31/2017   ALKPHOS 87 07/31/2017   BILITOT 0.3 07/31/2017   GFRNONAA 81.51 07/31/2017   GFRNONAA 64 11/09/2013 1135   GFRAA >60  12/12/2016 1428   GFRAA 74 11/09/2013 1135   Recent Labs    07/31/17  NA 140  K 5.0  BUN 23*  CREATININE 0.62  CALCIUM 8.9   Recent Labs    07/31/17  AST 14  ALT 5  ALKPHOS 87  BILITOT 0.3  PROT 5.8  ALBUMIN 3.5   Recent Labs    03/19/17 07/31/17  WBC 7.8 5.7  HGB 15.6 12.4  HCT 48.6 37.3  MCV  --  87.2   Recent Labs    07/31/17  CHOL 134  LDLCALC 61  TRIG 102   No results found for: [facility] Lab Results  Component Value Date   TSH 0.59 07/31/2017   Lab Results  Component Value Date   HGBA1C 5.4 06/11/2016   Lab Results  Component Value Date   CHOL 134 07/31/2017   HDL 54 06/11/2016   LDLCALC 61 07/31/2017   TRIG 102 07/31/2017    Significant Diagnostic Results in last 30 days:  No results found.  Assessment and Plan  Bipolar disease, chronic ([place]) Mostly controlled; continue Seroquel 350 mg nightly  Vitamin D deficiency Patient with normal level of vitamin D; continue 1000 units daily to maintain this level  Dry eye syndrome of both eyes No reports of problem or discomfort; continue xiidra 5% 1 drop each eye twice daily    Esaias Cleavenger D. Taylor Coil, MD

## 2018-02-26 ENCOUNTER — Non-Acute Institutional Stay (SKILLED_NURSING_FACILITY): Payer: Medicare Other | Admitting: Internal Medicine

## 2018-02-26 DIAGNOSIS — F411 Generalized anxiety disorder: Secondary | ICD-10-CM | POA: Diagnosis not present

## 2018-02-26 DIAGNOSIS — R441 Visual hallucinations: Secondary | ICD-10-CM | POA: Diagnosis not present

## 2018-02-26 DIAGNOSIS — E559 Vitamin D deficiency, unspecified: Secondary | ICD-10-CM

## 2018-02-26 DIAGNOSIS — F39 Unspecified mood [affective] disorder: Secondary | ICD-10-CM | POA: Diagnosis not present

## 2018-02-26 DIAGNOSIS — F319 Bipolar disorder, unspecified: Secondary | ICD-10-CM | POA: Diagnosis not present

## 2018-02-26 DIAGNOSIS — F0390 Unspecified dementia without behavioral disturbance: Secondary | ICD-10-CM | POA: Diagnosis not present

## 2018-02-26 DIAGNOSIS — H04123 Dry eye syndrome of bilateral lacrimal glands: Secondary | ICD-10-CM

## 2018-02-28 ENCOUNTER — Encounter: Payer: Self-pay | Admitting: Internal Medicine

## 2018-02-28 DIAGNOSIS — H04123 Dry eye syndrome of bilateral lacrimal glands: Secondary | ICD-10-CM | POA: Insufficient documentation

## 2018-02-28 NOTE — Assessment & Plan Note (Signed)
Mostly controlled; continue Seroquel 350 mg nightly

## 2018-02-28 NOTE — Assessment & Plan Note (Signed)
No reports of problem or discomfort; continue xiidra 5% 1 drop each eye twice daily

## 2018-02-28 NOTE — Assessment & Plan Note (Signed)
Patient with normal level of vitamin D; continue 1000 units daily to maintain this level

## 2018-03-26 ENCOUNTER — Other Ambulatory Visit: Payer: Self-pay

## 2018-03-26 MED ORDER — CLONAZEPAM 0.5 MG PO TABS: 0.2500 mg | ORAL_TABLET | Freq: Three times a day (TID) | ORAL | 2 refills | Status: DC

## 2018-03-27 ENCOUNTER — Non-Acute Institutional Stay (SKILLED_NURSING_FACILITY): Payer: Medicare Other | Admitting: Internal Medicine

## 2018-03-27 ENCOUNTER — Encounter: Payer: Self-pay | Admitting: Internal Medicine

## 2018-03-27 DIAGNOSIS — K219 Gastro-esophageal reflux disease without esophagitis: Secondary | ICD-10-CM | POA: Diagnosis not present

## 2018-03-27 DIAGNOSIS — F329 Major depressive disorder, single episode, unspecified: Secondary | ICD-10-CM | POA: Diagnosis not present

## 2018-03-27 DIAGNOSIS — F419 Anxiety disorder, unspecified: Secondary | ICD-10-CM | POA: Diagnosis not present

## 2018-03-27 DIAGNOSIS — F32A Depression, unspecified: Secondary | ICD-10-CM

## 2018-03-27 NOTE — Progress Notes (Signed)
Location:  [place] Room Number: 161W Place of Service:  SNF [phone])  Taylor Roth. [name], MD  Patient Care Team: [name], MD as PCP - General (Internal Medicine) [name], MD (Gastroenterology) [name], NP (Obstetrics and Gynecology)  Extended Emergency Contact Information Primary Emergency Contact: [name],[name] Address: [place], [place] Phone: [phone] Mobile Phone: [phone] Relation: Daughter Secondary Emergency Contact: [name] Phone: [phone] Mobile Phone: [phone] Relation: Daughter    Allergies: Amoxicillin; Ampicillin; Depakote [valproic acid]; Macrobid [nitrofurantoin macrocrystal]; Penicillins; Azithromycin; Doxycycline; Escitalopram oxalate; Fioricet [butalbital-apap-caffeine]; Latex; Levofloxacin; Nsaids; Oxycodone; Restasis [cyclosporine]; Sulfa antibiotics; and Biaxin [clarithromycin]  Chief Complaint  Patient presents with  . Medical Management of Chronic Issues    Routine visit    HPI: Patient is 85 y.o. female who is being seen for routine issues of GERD, depression, anxiety.  Past Medical History:  Diagnosis Date  . Anxiety   . Arterial tortuosity (aorta) 12/11/2012  . Asthma   . Chronic kidney disease, stage IV (severe) ([place]) 12/11/2012  . Chronic mental illness   . CKD (chronic kidney disease), stage III ([place]) 01/14/2015  . Closed hip fracture requiring operative repair, right, with routine healing, subsequent encounter 12/19/2015  . Colitis   . COPD (chronic obstructive pulmonary disease) ([place])   . Decreased rectal sphincter tone 11/21/2012  . Depression   . Drug-seeking behavior 02/26/2011   Use of multiple md to obtain benzos/narcotics pt must sign contract to receive controlled meds.   Marland Kitchen Dysphagia 01/11/2015  . GERD (gastroesophageal reflux disease)   . Glaucoma 06/26/2011  .  Glaucoma of both eyes   . Hearing loss, sensorineural 03/04/2014   [facility]; 02/26/14. There is not a medical or surgical treatment that would benefit her type of hearing loss. We discussed amplification in an overview fashion.    Marland Kitchen History of fall   . Insomnia, unspecified   . Macular degeneration 06/26/2011  . Migraines   . Nasal bone fracture 03/30/2014  . Osteoporosis   . Osteoporosis 02/26/2011  . Presence of right artificial hip joint   . Protein-calorie malnutrition, severe ([place]) 12/10/2012  . S/P ORIF (open reduction internal fixation) fracture 01/08/2016  . Schatzki's ring 01/14/2015  . Scoliosis   . Substance abuse ([place])    Benbzos and Narcotics, she does not get any of those medicines from [facility], we have empathetically told her that.  . Vertigo   . Vitamin B deficiency   . Vitamin D deficiency 08/25/2016  . Weight loss     Past Surgical History:  Procedure Laterality Date  . ABDOMINAL HYSTERECTOMY    . CATARACT EXTRACTION    . CHOLECYSTECTOMY    . ESOPHAGOGASTRODUODENOSCOPY (EGD) WITH PROPOFOL N/A 01/13/2015   Procedure: ESOPHAGOGASTRODUODENOSCOPY (EGD) WITH PROPOFOL;  Surgeon: [name], MD;  Location: [facility];  Service: Endoscopy;  Laterality: N/A;  . HIP ARTHROPLASTY Right 12/21/2015   Procedure: ARTHROPLASTY BIPOLAR HIP (HEMIARTHROPLASTY);  Surgeon: [name], MD;  Location: [place];  Service: Orthopedics;  Laterality: Right;  . PARTIAL HYSTERECTOMY      Allergies as of 03/27/2018      Reactions   Amoxicillin    Per daughter   Ampicillin    Per daughter   Depakote [valproic Acid]    DOES NOT WANT HER TO TAKE IT==Makes her hair fall out= per daughter  who is poa   Macrobid [nitrofurantoin Macrocrystal] Rash   Per MAR   Penicillins    Per daughter   Azithromycin Other (See Comments)   Per MAR   Doxycycline Other (See Comments)   Per MAR   Escitalopram Oxalate Other (See Comments)   Per MAR   Fioricet [butalbital-apap-caffeine] Other (See  Comments)   Drunk. Per MAR   Latex Other (See Comments)   Per MAR   Levofloxacin Other (See Comments)   Per MAR   Nsaids Other (See Comments)   This is not an allergy. The patient was told by Dr. Rockne Menghini to avoid NSAIDs and stop Vicoprofen in to 2014 to avoid nephrotoxicity. Per MAR.   Oxycodone Other (See Comments)   reaction to synthetic codeine Per MAR   Restasis [cyclosporine] Other (See Comments)   Per MAR   Sulfa Antibiotics Other (See Comments)   Per MAR   Biaxin [clarithromycin] Rash   With burning sensation Per [facility]      Medication List       Accurate as of March 27, 2018 11:59 PM. Always use your most recent med list.        acetaminophen 325 MG tablet Commonly known as:  TYLENOL Take 650 mg by mouth every 6 (six) hours as needed for headache.   albuterol 108 (90 Base) MCG/ACT inhaler Commonly known as:  PROVENTIL HFA;VENTOLIN HFA Inhale 1 puff into the lungs every 6 (six) hours as needed for shortness of breath.   Biotin 5000 MCG Caps Take 1 capsule by mouth daily.   CLARITIN 10 MG tablet Generic drug:  loratadine Take 10 mg by mouth daily.   clonazePAM 0.5 MG tablet Commonly known as:  KLONOPIN Take 0.5 tablets (0.25 mg total) by mouth 3 (three) times daily.   Cranberry 425 MG Caps Take 425 mg by mouth 2 (two) times daily.   docusate sodium 100 MG capsule Commonly known as:  COLACE Take 100 mg by mouth 2 (two) times daily.   dorzolamide-timolol 22.3-6.8 MG/ML ophthalmic solution Commonly known as:  COSOPT Place 1 drop into both eyes 2 (two) times daily.   ENSURE Take 237 mLs by mouth 4 (four) times daily. D/t weight loss   ferrous sulfate 325 (65 FE) MG tablet Take 325 mg by mouth daily.   fludrocortisone 0.1 MG tablet Commonly known as:  FLORINEF Take 0.1 mg by mouth every morning. for orthostatic hypotension   HYDROcodone-acetaminophen 5-325 MG tablet Commonly known as:  NORCO/VICODIN Take 1 tablet by mouth at bedtime. At 9 am , 1 pm  , and 9 pm for pain   lactulose 10 GM/15ML solution Commonly known as:  CHRONULAC Take 15 mLs (10 g total) by mouth daily as needed for severe constipation.   magnesium hydroxide 400 MG/5ML suspension Commonly known as:  MILK OF MAGNESIA Take 30 mLs by mouth daily as needed for mild constipation.   mirtazapine 15 MG tablet Commonly known as:  REMERON Take 7.5 mg by mouth at bedtime.   nitroGLYCERIN 0.4 MG SL tablet Commonly known as:  NITROSTAT Place 0.4 mg under the tongue every 5 (five) minutes as needed for chest pain. May use 3 times   polyethylene glycol packet Commonly known as:  MIRALAX / GLYCOLAX Take 17 g by mouth daily.   QUEtiapine 300 MG tablet Commonly known as:  SEROQUEL Take 300 mg by mouth at bedtime. Give with a 50 mg tablet to = 350 mg   QUEtiapine 100 MG tablet Commonly known as:  SEROQUEL Take 100 mg by mouth at bedtime.   senna 8.6 MG tablet Commonly known as:  SENOKOT Take 2 tablets by mouth 2 (two) times daily as needed for constipation.   SYSTANE 0.4-0.3 % Soln Generic drug:  Polyethyl Glycol-Propyl Glycol Apply to eye.   travoprost (benzalkonium) 0.004 % ophthalmic solution Commonly known as:  TRAVATAN Place 1 drop into both eyes at bedtime.   vitamin B-12 1000 MCG tablet Commonly known as:  CYANOCOBALAMIN Take 1,000 mcg by mouth daily.   Vitamin D 1000 units capsule Take 1,000 Units by mouth daily with breakfast.   XIIDRA 5 % Soln Generic drug:  Lifitegrast Apply to eye. Place 1 drop into both eyes twice a day       No orders of the defined types were placed in this encounter.   Immunization History  Administered Date(s) Administered  . Influenza Split 11/13/2011  . Influenza-Unspecified 02/13/2008, 11/30/2013, 12/20/2016  . PPD Test 12/15/2015  . Pneumococcal-Unspecified 02/13/2008, 10/16/2016  . Td 02/12/2009    Social History   Tobacco Use  . Smoking status: Former Smoker    Packs/day: 0.50    Types: Cigarettes  .  Smokeless tobacco: Never Used  Substance Use Topics  . Alcohol use: No    Review of Systems  DATA OBTAINED: from patient-limited; nursing-no acute concerns GENERAL:  no fevers, fatigue, appetite changes SKIN: No itching, rash HEENT: No complaint RESPIRATORY: No cough, wheezing, SOB CARDIAC: No chest pain, palpitations, lower extremity edema  GI: No abdominal pain, No N/V/D or constipation, No heartburn or reflux  GU: No dysuria, frequency or urgency, or incontinence  MUSCULOSKELETAL: No unrelieved bone/joint pain NEUROLOGIC: No headache, dizziness  PSYCHIATRIC: No overt anxiety or sadness  Vitals:   03/27/18 1402  BP: 96/68  Pulse: 78  Resp: 16  Temp: (!) 97.3 F (36.3 C)   Body mass index is 17.57 kg/m. Physical Exam  GENERAL APPEARANCE: Alert, conversant, No acute distress  SKIN: No diaphoresis rash HEENT: Unremarkable RESPIRATORY: Breathing is even, unlabored. Lung sounds are clear   CARDIOVASCULAR: Heart RRR no murmurs, rubs or gallops. No peripheral edema  GASTROINTESTINAL: Abdomen is soft, non-tender, not distended w/ normal bowel sounds.  GENITOURINARY: Bladder non tender, not distended  MUSCULOSKELETAL: No abnormal joints or musculature said very thin NEUROLOGIC: Cranial nerves 2-12 grossly intact. Moves all extremities PSYCHIATRIC: Mood and affect with dementia, no behavioral issues  Patient Active Problem List   Diagnosis Date Noted  . Dry eye syndrome of both eyes 02/28/2018  . Abnormal EKG 01/24/2017  . Delusions ([place])   . Paranoia ([place])   . Vitamin D deficiency 08/25/2016  . Dementia with behavioral problem ([place]) 03/25/2016  . S/P ORIF (open reduction internal fixation) fracture 01/08/2016  . Oral thrush 01/08/2016  . HTN (hypertension) 01/08/2016  . Closed hip fracture requiring operative repair, right, with routine healing, subsequent encounter 12/19/2015  . Bipolar disease, chronic ([place]) 12/17/2015  . Decubitus ulcer of hip, stage 3 ([place])  12/11/2015  . Legally blind 08/16/2015  . Ankle pain   . Encephalopathy   . Malnutrition of moderate degree 01/29/2015  . Altered mental status 01/26/2015  . Nausea and vomiting 01/17/2015  . Esophageal dysmotilities   . Urinary tract infectious disease   . Weakness 01/14/2015  . Schatzki's ring 01/14/2015  . CKD (chronic kidney disease), stage III ([employer]) 01/14/2015  . Esophagus disorder   . Dysphagia 01/11/2015  . UTI (lower urinary tract infection) 03/30/2014  . Hearing loss, sensorineural 03/04/2014  .  Nasal lesion 03/04/2014  . Constipation 12/12/2012  . Hypokalemia 12/11/2012  . Orthostatic hypotension 12/11/2012  . Arterial tortuosity (aorta) 12/11/2012  . Protein-calorie malnutrition, severe ([place]) 12/10/2012  . Adult failure to thrive 12/09/2012  . Decreased rectal sphincter tone 11/21/2012  . Primary open angle glaucoma of both eyes, severe stage 11/11/2012  . Idiopathic scoliosis 07/08/2012  . Macular atrophy, retinal 06/26/2011  . Vision disturbance 06/26/2011  . Glaucoma 06/26/2011  . Nonexudative senile macular degeneration of retina 06/21/2011  . Macula scar of posterior pole 06/21/2011  . Status post intraocular lens implant 06/21/2011  . Anemia of chronic disease 04/17/2011  . Drug-seeking behavior 02/26/2011  . Osteoporosis 02/26/2011  . Depression   . Anxiety   . COPD (chronic obstructive pulmonary disease) ([place])   . GERD (gastroesophageal reflux disease)   . Migraines   . Insomnia, unspecified     CMP     Component Value Date/Time   NA 140 07/31/2017   K 5.0 07/31/2017   CL 109 12/12/2016 1428   CO2 22 12/12/2016 1428   GLUCOSE 90 12/12/2016 1428   BUN 23 (A) 07/31/2017   CREATININE 0.62 07/31/2017   CALCIUM 8.9 07/31/2017   PROT 5.8 07/31/2017   ALBUMIN 3.5 07/31/2017   AST 14 07/31/2017   ALT 5 07/31/2017   ALKPHOS 87 07/31/2017   BILITOT 0.3 07/31/2017   GFRNONAA 81.51 07/31/2017   GFRNONAA 64 11/09/2013 1135   GFRAA >60 12/12/2016  1428   GFRAA 74 11/09/2013 1135   Recent Labs    07/31/17  NA 140  K 5.0  BUN 23*  CREATININE 0.62  CALCIUM 8.9   Recent Labs    07/31/17  AST 14  ALT 5  ALKPHOS 87  BILITOT 0.3  PROT 5.8  ALBUMIN 3.5   Recent Labs    07/31/17  WBC 5.7  HGB 12.4  HCT 37.3  MCV 87.2   Recent Labs    07/31/17  CHOL 134  LDLCALC 61  TRIG 102   No results found for: [facility] Lab Results  Component Value Date   TSH 0.59 07/31/2017   Lab Results  Component Value Date   HGBA1C 5.4 06/11/2016   Lab Results  Component Value Date   CHOL 134 07/31/2017   HDL 54 06/11/2016   LDLCALC 61 07/31/2017   TRIG 102 07/31/2017    Significant Diagnostic Results in last 30 days:  No results found.  Assessment and Plan  GERD (gastroesophageal reflux disease) Controlled with milk of magnesia 30 cc daily; continue current regimen  Depression Relatively well controlled; continue Remeron 15 mg daily  Anxiety Stable at the moment; continue clonazepam 0.25 mg p.o. twice daily      D. [name], MD

## 2018-03-29 ENCOUNTER — Encounter: Payer: Self-pay | Admitting: Internal Medicine

## 2018-03-29 NOTE — Assessment & Plan Note (Signed)
Stable at the moment; continue clonazepam 0.25 mg p.o. twice daily

## 2018-03-29 NOTE — Assessment & Plan Note (Signed)
Controlled with milk of magnesia 30 cc daily; continue current regimen

## 2018-03-29 NOTE — Assessment & Plan Note (Signed)
Relatively well controlled; continue Remeron 15 mg daily

## 2018-04-08 DIAGNOSIS — F0390 Unspecified dementia without behavioral disturbance: Secondary | ICD-10-CM | POA: Diagnosis not present

## 2018-04-08 DIAGNOSIS — F22 Delusional disorders: Secondary | ICD-10-CM | POA: Diagnosis not present

## 2018-04-08 DIAGNOSIS — F39 Unspecified mood [affective] disorder: Secondary | ICD-10-CM | POA: Diagnosis not present

## 2018-04-08 DIAGNOSIS — R440 Auditory hallucinations: Secondary | ICD-10-CM | POA: Diagnosis not present

## 2018-04-29 ENCOUNTER — Encounter: Payer: Self-pay | Admitting: Internal Medicine

## 2018-04-29 ENCOUNTER — Non-Acute Institutional Stay (SKILLED_NURSING_FACILITY): Payer: Medicare Other | Admitting: Internal Medicine

## 2018-04-29 DIAGNOSIS — J449 Chronic obstructive pulmonary disease, unspecified: Secondary | ICD-10-CM

## 2018-04-29 DIAGNOSIS — G301 Alzheimer's disease with late onset: Secondary | ICD-10-CM

## 2018-04-29 DIAGNOSIS — I951 Orthostatic hypotension: Secondary | ICD-10-CM

## 2018-04-29 DIAGNOSIS — F02818 Dementia in other diseases classified elsewhere, unspecified severity, with other behavioral disturbance: Secondary | ICD-10-CM

## 2018-04-29 DIAGNOSIS — F0281 Dementia in other diseases classified elsewhere with behavioral disturbance: Secondary | ICD-10-CM

## 2018-04-29 NOTE — Progress Notes (Signed)
Location:  [place] Room Number: 829F Place of Service:  SNF [phone])  Taylor Roth. Taylor Coil, MD  Patient Care Team: [name], MD as PCP - General (Internal Medicine) [name], MD (Gastroenterology) [name], NP (Obstetrics and Gynecology)  Extended Emergency Contact Information Primary Emergency Contact: [name],[name] Address: [address], [place] Phone: [phone] Mobile Phone: [phone] Relation: Daughter Secondary Emergency Contact: [name] Phone: [phone] Mobile Phone: [phone] Relation: Daughter    Allergies: Amoxicillin; Ampicillin; Depakote [valproic acid]; Macrobid [nitrofurantoin macrocrystal]; Penicillins; Azithromycin; Doxycycline; Escitalopram oxalate; Fioricet [butalbital-apap-caffeine]; Latex; Levofloxacin; Nsaids; Oxycodone; Restasis [cyclosporine]; Sulfa antibiotics; and Biaxin [clarithromycin]  Chief Complaint  Patient presents with  . Medical Management of Chronic Issues    Routine visit    HPI: Patient is 85 y.o. female who being seen for routine issues of orthostatic hypotension, COPD, dementia.  Past Medical History:  Diagnosis Date  . Anxiety   . Arterial tortuosity (aorta) 12/11/2012  . Asthma   . Chronic kidney disease, stage IV (severe) ([place]) 12/11/2012  . Chronic mental illness   . CKD (chronic kidney disease), stage III ([place]) 01/14/2015  . Closed hip fracture requiring operative repair, right, with routine healing, subsequent encounter 12/19/2015  . Colitis   . COPD (chronic obstructive pulmonary disease) ([place])   . Decreased rectal sphincter tone 11/21/2012  . Depression   . Drug-seeking behavior 02/26/2011   Use of multiple md to obtain benzos/narcotics pt must sign contract to receive controlled meds.   Marland Kitchen Dysphagia 01/11/2015  . GERD (gastroesophageal reflux disease)   . Glaucoma  06/26/2011  . Glaucoma of both eyes   . Hearing loss, sensorineural 03/04/2014   [facility] ENT; 02/26/14. There is not a medical or surgical treatment that would benefit her type of hearing loss. We discussed amplification in an overview fashion.    Marland Kitchen History of fall   . Insomnia, unspecified   . Macular degeneration 06/26/2011  . Migraines   . Nasal bone fracture 03/30/2014  . Osteoporosis   . Osteoporosis 02/26/2011  . Presence of right artificial hip joint   . Protein-calorie malnutrition, severe ([place]) 12/10/2012  . S/P ORIF (open reduction internal fixation) fracture 01/08/2016  . Schatzki's ring 01/14/2015  . Scoliosis   . Substance abuse ([place])    Benbzos and Narcotics, she does not get any of those medicines from [facility], we have empathetically told her that.  . Vertigo   . Vitamin B deficiency   . Vitamin D deficiency 08/25/2016  . Weight loss     Past Surgical History:  Procedure Laterality Date  . ABDOMINAL HYSTERECTOMY    . CATARACT EXTRACTION    . CHOLECYSTECTOMY    . ESOPHAGOGASTRODUODENOSCOPY (EGD) WITH PROPOFOL N/A 01/13/2015   Procedure: ESOPHAGOGASTRODUODENOSCOPY (EGD) WITH PROPOFOL;  Surgeon: [name], MD;  [facility];  Service: Endoscopy;  Laterality: N/A;  . HIP ARTHROPLASTY Right 12/21/2015   Procedure: ARTHROPLASTY BIPOLAR HIP (HEMIARTHROPLASTY);  Surgeon: [name], MD;  Location: [place];  Service: Orthopedics;  Laterality: Right;  . PARTIAL HYSTERECTOMY      Allergies as of 04/29/2018      Reactions   Amoxicillin    Per daughter   Ampicillin    Per daughter   Depakote [valproic Acid]    DOES NOT WANT HER TO TAKE IT==Makes her hair fall out= per daughter  who is poa   Macrobid [nitrofurantoin Macrocrystal] Rash   Per MAR   Penicillins    Per daughter   Azithromycin Other (See Comments)   Per MAR   Doxycycline Other (See Comments)   Per MAR   Escitalopram Oxalate Other (See Comments)   Per MAR   Fioricet  [butalbital-apap-caffeine] Other (See Comments)   Drunk. Per MAR   Latex Other (See Comments)   Per MAR   Levofloxacin Other (See Comments)   Per MAR   Nsaids Other (See Comments)   This is not an allergy. The patient was told by Dr. Rockne Menghini to avoid NSAIDs and stop Vicoprofen in to 2014 to avoid nephrotoxicity. Per MAR.   Oxycodone Other (See Comments)   reaction to synthetic codeine Per MAR   Restasis [cyclosporine] Other (See Comments)   Per MAR   Sulfa Antibiotics Other (See Comments)   Per MAR   Biaxin [clarithromycin] Rash   With burning sensation Per [facility]      Medication List       Accurate as of April 29, 2018 11:59 PM. Always use your most recent med list.        acetaminophen 325 MG tablet Commonly known as:  TYLENOL Take 650 mg by mouth every 6 (six) hours as needed for headache.   albuterol 108 (90 Base) MCG/ACT inhaler Commonly known as:  PROVENTIL HFA;VENTOLIN HFA Inhale 1 puff into the lungs every 6 (six) hours as needed for shortness of breath.   Biotin 5000 MCG Caps Take 1 capsule by mouth daily.   Claritin 10 MG tablet Generic drug:  loratadine Take 10 mg by mouth daily.   clonazePAM 0.5 MG tablet Commonly known as:  KLONOPIN Take 0.5 tablets (0.25 mg total) by mouth 3 (three) times daily.   Cranberry 425 MG Caps Take 425 mg by mouth 2 (two) times daily.   docusate sodium 100 MG capsule Commonly known as:  COLACE Take 100 mg by mouth 2 (two) times daily.   dorzolamide-timolol 22.3-6.8 MG/ML ophthalmic solution Commonly known as:  COSOPT Place 1 drop into both eyes 2 (two) times daily.   Ensure Take 237 mLs by mouth 4 (four) times daily. D/t weight loss   ferrous sulfate 325 (65 FE) MG tablet Take 325 mg by mouth daily.   fludrocortisone 0.1 MG tablet Commonly known as:  FLORINEF Take 0.1 mg by mouth every morning. for orthostatic hypotension   HYDROcodone-acetaminophen 5-325 MG tablet Commonly known as:  NORCO/VICODIN Take 1 tablet  by mouth at bedtime. At 9 am , 1 pm , and 9 pm for pain   lactulose 10 GM/15ML solution Commonly known as:  CHRONULAC Take 15 mLs (10 g total) by mouth daily as needed for severe constipation.   magnesium hydroxide 400 MG/5ML suspension Commonly known as:  MILK OF MAGNESIA Take 30 mLs by mouth daily as needed for mild constipation.   mirtazapine 15 MG tablet Commonly known as:  REMERON Take 7.5 mg by mouth at bedtime.   nitroGLYCERIN 0.4 MG SL tablet Commonly known as:  NITROSTAT Place 0.4 mg under the tongue every 5 (five) minutes as needed for chest pain. May use 3 times   polyethylene glycol packet Commonly known as:  MIRALAX / GLYCOLAX Take 17 g by mouth daily.   QUEtiapine 300 MG tablet Commonly known as:  SEROQUEL Take 300 mg by mouth at bedtime. Give with a 50 mg tablet to = 350 mg   QUEtiapine 100 MG tablet Commonly known as:  SEROQUEL Take 100 mg by mouth at bedtime.   senna 8.6 MG tablet Commonly known as:  SENOKOT Take 2 tablets by mouth 2 (two) times daily as needed for constipation.   Systane 0.4-0.3 % Soln Generic drug:  Polyethyl Glycol-Propyl Glycol Apply to eye.   travoprost (benzalkonium) 0.004 % ophthalmic solution Commonly known as:  TRAVATAN Place 1 drop into both eyes at bedtime.   vitamin B-12 1000 MCG tablet Commonly known as:  CYANOCOBALAMIN Take 1,000 mcg by mouth daily.   Vitamin D 1000 units capsule Take 1,000 Units by mouth daily with breakfast.   Xiidra 5 % Soln Generic drug:  Lifitegrast Apply to eye. Place 1 drop into both eyes twice a day       No orders of the defined types were placed in this encounter.   Immunization History  Administered Date(s) Administered  . Influenza Split 11/13/2011  . Influenza-Unspecified 02/13/2008, 11/30/2013, 12/20/2016  . PPD Test 12/15/2015  . Pneumococcal-Unspecified 02/13/2008, 10/16/2016  . Td 02/12/2009    Social History   Tobacco Use  . Smoking status: Former Smoker     Packs/day: 0.50    Types: Cigarettes  . Smokeless tobacco: Never Used  Substance Use Topics  . Alcohol use: No    Review of Systems  DATA OBTAINED: from patient very limited; nursing-no acute concerns GENERAL:  no fevers, fatigue, appetite changes SKIN: No itching, rash HEENT: No complaint RESPIRATORY: No cough, wheezing, SOB CARDIAC: No chest pain, palpitations, lower extremity edema  GI: No abdominal pain, No N/V/D or constipation, No heartburn or reflux  GU: No dysuria, frequency or urgency, or incontinence  MUSCULOSKELETAL: No unrelieved bone/joint pain NEUROLOGIC: No headache, dizziness  PSYCHIATRIC: No overt anxiety or sadness  Vitals:   04/29/18 0916  BP: 98/68  Pulse: 70  Resp: 16  Temp: 97.8 F (36.6 C)   Body mass index is 16.36 kg/m. Physical Exam  GENERAL APPEARANCE: Alert, conversant, No acute distress  SKIN: No diaphoresis rash HEENT: Unremarkable RESPIRATORY: Breathing is even, unlabored. Lung sounds are clear   CARDIOVASCULAR: Heart RRR no murmurs, rubs or gallops. No peripheral edema  GASTROINTESTINAL: Abdomen is soft, non-tender, not distended w/ normal bowel sounds.  GENITOURINARY: Bladder non tender, not distended  MUSCULOSKELETAL: No abnormal joints or musculature NEUROLOGIC: Cranial nerves 2-12 grossly intact. Moves all extremities PSYCHIATRIC: Mood and affect dementia, no behavioral issues  Patient Active Problem List   Diagnosis Date Noted  . Dry eye syndrome of both eyes 02/28/2018  . Abnormal EKG 01/24/2017  . Delusions ([place])   . Paranoia ([place])   . Vitamin D deficiency 08/25/2016  . Dementia with behavioral problem ([place]) 03/25/2016  . S/P ORIF (open reduction internal fixation) fracture 01/08/2016  . Oral thrush 01/08/2016  . HTN (hypertension) 01/08/2016  . Closed hip fracture requiring operative repair, right, with routine healing, subsequent encounter 12/19/2015  . Bipolar disease, chronic ([place]) 12/17/2015  . Decubitus ulcer of  hip, stage 3 ([place]) 12/11/2015  . Legally blind 08/16/2015  . Ankle pain   . Encephalopathy   . Malnutrition of moderate degree 01/29/2015  . Altered mental status 01/26/2015  . Nausea and vomiting 01/17/2015  . Esophageal dysmotilities   . Urinary tract infectious disease   . Weakness 01/14/2015  . Schatzki's ring 01/14/2015  . CKD (chronic kidney disease), stage III ([place]) 01/14/2015  . Esophagus disorder   . Dysphagia 01/11/2015  . UTI (lower urinary tract infection) 03/30/2014  . Hearing loss, sensorineural 03/04/2014  . Nasal lesion 03/04/2014  .  Constipation 12/12/2012  . Hypokalemia 12/11/2012  . Orthostatic hypotension 12/11/2012  . Arterial tortuosity (aorta) 12/11/2012  . Protein-calorie malnutrition, severe ([place]) 12/10/2012  . Adult failure to thrive 12/09/2012  . Decreased rectal sphincter tone 11/21/2012  . Primary open angle glaucoma of both eyes, severe stage 11/11/2012  . Idiopathic scoliosis 07/08/2012  . Macular atrophy, retinal 06/26/2011  . Vision disturbance 06/26/2011  . Glaucoma 06/26/2011  . Nonexudative senile macular degeneration of retina 06/21/2011  . Macula scar of posterior pole 06/21/2011  . Status post intraocular lens implant 06/21/2011  . Anemia of chronic disease 04/17/2011  . Drug-seeking behavior 02/26/2011  . Osteoporosis 02/26/2011  . Depression   . Anxiety   . COPD (chronic obstructive pulmonary disease) ([place])   . GERD (gastroesophageal reflux disease)   . Migraines   . Insomnia, unspecified     CMP     Component Value Date/Time   NA 140 07/31/2017   K 5.0 07/31/2017   CL 109 12/12/2016 1428   CO2 22 12/12/2016 1428   GLUCOSE 90 12/12/2016 1428   BUN 23 (A) 07/31/2017   CREATININE 0.62 07/31/2017   CALCIUM 8.9 07/31/2017   PROT 5.8 07/31/2017   ALBUMIN 3.5 07/31/2017   AST 14 07/31/2017   ALT 5 07/31/2017   ALKPHOS 87 07/31/2017   BILITOT 0.3 07/31/2017   GFRNONAA 81.51 07/31/2017   GFRNONAA 64 11/09/2013 1135   GFRAA  >60 12/12/2016 1428   GFRAA 74 11/09/2013 1135   Recent Labs    07/31/17  NA 140  K 5.0  BUN 23*  CREATININE 0.62  CALCIUM 8.9   Recent Labs    07/31/17  AST 14  ALT 5  ALKPHOS 87  BILITOT 0.3  PROT 5.8  ALBUMIN 3.5   Recent Labs    07/31/17  WBC 5.7  HGB 12.4  HCT 37.3  MCV 87.2   Recent Labs    07/31/17  CHOL 134  LDLCALC 61  TRIG 102   No results found for: [facility] Lab Results  Component Value Date   TSH 0.59 07/31/2017   Lab Results  Component Value Date   HGBA1C 5.4 06/11/2016   Lab Results  Component Value Date   CHOL 134 07/31/2017   HDL 54 06/11/2016   LDLCALC 61 07/31/2017   TRIG 102 07/31/2017    Significant Diagnostic Results in last 30 days:  No results found.  Assessment and Plan  Orthostatic hypotension Continues acceptable; continue Florinef 0.1 mg daily  COPD (chronic obstructive pulmonary disease) ([place]) Continues without exacerbation; continue PRN albuterol  Dementia with behavioral problem ([place]) Chronic and progressive; continue supportive care     Cailie Bosshart D. Taylor Coil, MD

## 2018-05-04 ENCOUNTER — Encounter: Payer: Self-pay | Admitting: Internal Medicine

## 2018-05-04 NOTE — Assessment & Plan Note (Signed)
Chronic and progressive; continue supportive care

## 2018-05-04 NOTE — Assessment & Plan Note (Signed)
Continues acceptable; continue Florinef 0.1 mg daily

## 2018-05-04 NOTE — Assessment & Plan Note (Signed)
Continues without exacerbation; continue PRN albuterol

## 2018-05-07 ENCOUNTER — Other Ambulatory Visit: Payer: Self-pay | Admitting: Adult Health

## 2018-05-07 MED ORDER — CLONAZEPAM 0.5 MG PO TABS: 0.2500 mg | ORAL_TABLET | Freq: Three times a day (TID) | ORAL | 0 refills | Status: DC

## 2018-05-14 ENCOUNTER — Non-Acute Institutional Stay (SKILLED_NURSING_FACILITY): Payer: Medicare Other | Admitting: Adult Health

## 2018-05-14 ENCOUNTER — Other Ambulatory Visit: Payer: Self-pay | Admitting: Adult Health

## 2018-05-14 ENCOUNTER — Encounter: Payer: Self-pay | Admitting: Adult Health

## 2018-05-14 DIAGNOSIS — H409 Unspecified glaucoma: Secondary | ICD-10-CM

## 2018-05-14 DIAGNOSIS — N183 Chronic kidney disease, stage 3 unspecified: Secondary | ICD-10-CM

## 2018-05-14 DIAGNOSIS — F319 Bipolar disorder, unspecified: Secondary | ICD-10-CM

## 2018-05-14 DIAGNOSIS — R52 Pain, unspecified: Secondary | ICD-10-CM | POA: Diagnosis not present

## 2018-05-14 DIAGNOSIS — I951 Orthostatic hypotension: Secondary | ICD-10-CM | POA: Diagnosis not present

## 2018-05-14 DIAGNOSIS — K5901 Slow transit constipation: Secondary | ICD-10-CM

## 2018-05-14 DIAGNOSIS — E43 Unspecified severe protein-calorie malnutrition: Secondary | ICD-10-CM | POA: Diagnosis not present

## 2018-05-14 DIAGNOSIS — G8929 Other chronic pain: Secondary | ICD-10-CM

## 2018-05-14 DIAGNOSIS — F0281 Dementia in other diseases classified elsewhere with behavioral disturbance: Secondary | ICD-10-CM

## 2018-05-14 DIAGNOSIS — D638 Anemia in other chronic diseases classified elsewhere: Secondary | ICD-10-CM | POA: Diagnosis not present

## 2018-05-14 DIAGNOSIS — G301 Alzheimer's disease with late onset: Secondary | ICD-10-CM

## 2018-05-14 DIAGNOSIS — J449 Chronic obstructive pulmonary disease, unspecified: Secondary | ICD-10-CM

## 2018-05-14 DIAGNOSIS — F02818 Dementia in other diseases classified elsewhere, unspecified severity, with other behavioral disturbance: Secondary | ICD-10-CM

## 2018-05-14 MED ORDER — HYDROCODONE-ACETAMINOPHEN 5-325 MG PO TABS: 1.0000 | ORAL_TABLET | Freq: Every day | ORAL | 0 refills | Status: DC

## 2018-05-14 NOTE — Progress Notes (Signed)
Location:  [place] Room Number: 638G Place of Service:  SNF (31)   CODE STATUS: DNR  Allergies  Allergen Reactions  . Amoxicillin     Per daughter  . Ampicillin     Per daughter  . Depakote [Valproic Acid]     DOES NOT WANT HER TO TAKE IT==Makes her hair fall out= per daughter who is poa  . Macrobid [Nitrofurantoin Macrocrystal] Rash    Per MAR  . Penicillins     Per daughter  . Azithromycin Other (See Comments)    Per MAR  . Doxycycline Other (See Comments)    Per MAR  . Escitalopram Oxalate Other (See Comments)    Per MAR  . Fioricet [Butalbital-Apap-Caffeine] Other (See Comments)    Drunk. Per MAR  . Latex Other (See Comments)    Per MAR  . Levofloxacin Other (See Comments)    Per MAR  . Nsaids Other (See Comments)    This is not an allergy. The patient was told by Dr. Rockne Menghini to avoid NSAIDs and stop Vicoprofen in to 2014 to avoid nephrotoxicity.  Per MAR.  Marland Kitchen Oxycodone Other (See Comments)    reaction to synthetic codeine Per MAR  . Restasis [Cyclosporine] Other (See Comments)    Per MAR  . Sulfa Antibiotics Other (See Comments)    Per MAR  . Biaxin [Clarithromycin] Rash    With burning sensation Per [facility]    Chief Complaint  Patient presents with  . Acute Visit    Medication Refusal    HPI:  I have been asked to review her medications as she has been declining her eye drops. There are no reports of agitation; no reports of uncontrolled pain; no reports of changes in appetite; no reports of fevers present.   Past Medical History:  Diagnosis Date  . Anxiety   . Arterial tortuosity (aorta) 12/11/2012  . Asthma   . Chronic kidney disease, stage IV (severe) ([place]) 12/11/2012  . Chronic mental illness   . CKD (chronic kidney disease), stage III ([place]) 01/14/2015  . Closed hip fracture requiring operative repair, right, with routine healing, subsequent encounter 12/19/2015  . Colitis   . COPD (chronic obstructive pulmonary  disease) ([place])   . Decreased rectal sphincter tone 11/21/2012  . Depression   . Drug-seeking behavior 02/26/2011   Use of multiple md to obtain benzos/narcotics pt must sign contract to receive controlled meds.   Marland Kitchen Dysphagia 01/11/2015  . GERD (gastroesophageal reflux disease)   . Glaucoma 06/26/2011  . Glaucoma of both eyes   . Hearing loss, sensorineural 03/04/2014   [facility] ENT; 02/26/14. There is not a medical or surgical treatment that would benefit her type of hearing loss. We discussed amplification in an overview fashion.    Marland Kitchen History of fall   . Insomnia, unspecified   . Macular degeneration 06/26/2011  . Migraines   . Nasal bone fracture 03/30/2014  . Osteoporosis   . Osteoporosis 02/26/2011  . Presence of right artificial hip joint   . Protein-calorie malnutrition, severe ([place]) 12/10/2012  . S/P ORIF (open reduction internal fixation) fracture 01/08/2016  . Schatzki's ring 01/14/2015  . Scoliosis   . Substance abuse ([place])    Benbzos and Narcotics, she does not get any of those medicines from [facility], we have empathetically told her that.  . Vertigo   . Vitamin B deficiency   . Vitamin D deficiency 08/25/2016  . Weight loss     Past Surgical  History:  Procedure Laterality Date  . ABDOMINAL HYSTERECTOMY    . CATARACT EXTRACTION    . CHOLECYSTECTOMY    . ESOPHAGOGASTRODUODENOSCOPY (EGD) WITH PROPOFOL N/A 01/13/2015   Procedure: ESOPHAGOGASTRODUODENOSCOPY (EGD) WITH PROPOFOL;  Surgeon: [name], MD;  [facility];  Service: Endoscopy;  Laterality: N/A;  . HIP ARTHROPLASTY Right 12/21/2015   Procedure: ARTHROPLASTY BIPOLAR HIP (HEMIARTHROPLASTY);  Surgeon: [name], MD;  Location: [place];  Service: Orthopedics;  Laterality: Right;  . PARTIAL HYSTERECTOMY      Social History   Socioeconomic History  . Marital status: Divorced    Spouse name: Not on file  . Number of children: Not on file  . Years of education: Not on file  . Highest education  level: Not on file  Occupational History  . Occupation: retired Equities trader  . Financial resource strain: Not hard at all  . Food insecurity:    Worry: Never true    Inability: Never true  . Transportation needs:    Medical: No    Non-medical: No  Tobacco Use  . Smoking status: Former Smoker    Packs/day: 0.50    Types: Cigarettes  . Smokeless tobacco: Never Used  Substance and Sexual Activity  . Alcohol use: No  . Drug use: No  . Sexual activity: Never  Lifestyle  . Physical activity:    Days per week: 0 days    Minutes per session: 0 min  . Stress: Not at all  Relationships  . Social connections:    Talks on phone: Twice a week    Gets together: Twice a week    Attends religious service: Never    Active member of club or organization: No    Attends meetings of clubs or organizations: Never    Relationship status: Divorced  . Intimate partner violence:    Fear of current or ex partner: No    Emotionally abused: No    Physically abused: No    Forced sexual activity: No  Other Topics Concern  . Not on file  Social History Narrative   Admitted to [facility] 12/15/2015   Divorced   Former Smoker   Alcohol none   DNR   Family History  Problem Relation Age of Onset  . Stroke Mother   . Diabetes Mother   . Heart disease Mother   . Hearing loss Mother   . Cancer Father 34       esophageal      VITAL SIGNS BP 110/60   Pulse 72   Temp 98.5 F (36.9 C) (Oral)   Resp 16   Ht 4\' 11"  (1.499 m)   Wt 85 lb (38.6 kg)   LMP  (LMP Unknown)   BMI 17.17 kg/m   Outpatient Encounter Medications as of 05/14/2018  Medication Sig  . acetaminophen (TYLENOL) 325 MG tablet Take 650 mg by mouth every 6 (six) hours as needed for headache.  . albuterol (PROVENTIL HFA;VENTOLIN HFA) 108 (90 BASE) MCG/ACT inhaler Inhale 1 puff into the lungs every 6 (six) hours as needed for shortness of breath.   . Biotin 5000 MCG CAPS Take 1 capsule by mouth daily.   .  Cholecalciferol (VITAMIN D) 1000 UNITS capsule Take 1,000 Units by mouth daily with breakfast.   . clonazePAM (KLONOPIN) 0.5 MG tablet Take 0.5 tablets (0.25 mg total) by mouth 3 (three) times daily.  . Cranberry 450 MG TABS Take 1 tablet by mouth 2 (two) times  daily.  . docusate sodium (COLACE) 100 MG capsule Take 100 mg by mouth 2 (two) times daily.  . dorzolamide-timolol (COSOPT) 22.3-6.8 MG/ML ophthalmic solution Place 1 drop into both eyes 2 (two) times daily.  Marland Kitchen ENSURE (ENSURE) Take 237 mLs by mouth 4 (four) times daily. D/t weight loss  . ferrous sulfate 325 (65 FE) MG tablet Take 325 mg by mouth daily.   . fludrocortisone (FLORINEF) 0.1 MG tablet Take 0.1 mg by mouth every morning. for orthostatic hypotension  . HYDROcodone-acetaminophen (NORCO/VICODIN) 5-325 MG tablet Take 1 tablet by mouth at bedtime. At 9 am , 1 pm , and 9 pm for pain  . lactulose (CHRONULAC) 10 GM/15ML solution Take 15 mLs (10 g total) by mouth daily as needed for severe constipation.  Marland Kitchen Lifitegrast (XIIDRA) 5 % SOLN Apply to eye. Place 1 drop into both eyes twice a day  . loratadine (CLARITIN) 10 MG tablet Take 10 mg by mouth daily.  . magnesium hydroxide (MILK OF MAGNESIA) 400 MG/5ML suspension Take 30 mLs by mouth daily as needed for mild constipation.  . mirtazapine (REMERON) 15 MG tablet Take 7.5 mg by mouth at bedtime.  . nitroGLYCERIN (NITROSTAT) 0.4 MG SL tablet Place 0.4 mg under the tongue every 5 (five) minutes as needed for chest pain. May use 3 times  . Polyethyl Glycol-Propyl Glycol (SYSTANE) 0.4-0.3 % SOLN Place 2 drops into both eyes every 2 (two) hours as needed.   . polyethylene glycol (MIRALAX / GLYCOLAX) packet Take 17 g by mouth daily.   . QUEtiapine (SEROQUEL) 100 MG tablet Take 100 mg by mouth at bedtime.  Marland Kitchen QUEtiapine (SEROQUEL) 300 MG tablet Take 300 mg by mouth at bedtime. Give with a 50 mg tablet to = 350 mg  . senna (SENOKOT) 8.6 MG tablet Take 2 tablets by mouth 2 (two) times daily as  needed for constipation.  . travoprost, benzalkonium, (TRAVATAN) 0.004 % ophthalmic solution Place 1 drop into both eyes at bedtime.  . vitamin B-12 (CYANOCOBALAMIN) 1000 MCG tablet Take 1,000 mcg by mouth daily.  . [DISCONTINUED] Cranberry 425 MG CAPS Take 425 mg by mouth 2 (two) times daily.    No facility-administered encounter medications on file as of 05/14/2018.      SIGNIFICANT DIAGNOSTIC EXAMS  NONE RECENT   Review of Systems  Unable to perform ROS: Dementia (unable to participate )   Physical Exam Constitutional:      General: She is not in acute distress.    Appearance: She is underweight. She is not diaphoretic.  Neck:     Musculoskeletal: Neck supple.     Thyroid: No thyromegaly.  Cardiovascular:     Rate and Rhythm: Normal rate and regular rhythm.     Pulses: Normal pulses.     Heart sounds: Normal heart sounds.  Pulmonary:     Effort: Pulmonary effort is normal. No respiratory distress.     Breath sounds: Normal breath sounds.  Abdominal:     General: Bowel sounds are normal. There is no distension.     Palpations: Abdomen is soft.     Tenderness: There is no abdominal tenderness.  Musculoskeletal:     Right lower leg: No edema.     Left lower leg: No edema.     Comments: Is able to move all extremities   Lymphadenopathy:     Cervical: No cervical adenopathy.  Skin:    General: Skin is warm and dry.  Neurological:     Mental Status: She is  alert. Mental status is at baseline.  Psychiatric:        Mood and Affect: Mood normal.       ASSESSMENT/ PLAN:  TODAY:   1. Orthostatic hypotension is stable b/p 110/60 will continue florinef 0.1 mg daily   2. Chronic obstructive pulmonary disease unspecified COPD type: is stable will continue claritin 10 mg daily albuterol 1 puff every 6 hours as needed  3. Late onset alzheimer's disease with behavioral disturbance: is without change: weight is 85 pounds will monitor   4.  CKD (chronic kidney disease) stage  III: without change will monitor   5. Protein calorie malnutrition severe: without change: weight is 85 pounds; will continue ensure 4 times daily and will continue remeron 7.5 mg nightly for appetite   6. Slow transit constipation: is stable will continue miralax daily colace twice daily   7. Bipolar disease chronic: is stable will continue klonopin 0.25 mg three times daily and seroquel 350 mg nightly   8. Anemia of chronic disease: is stable will continue iron daily   9. Chronic generalized pain: is stable will continue vicodin 5/325 mg three times daily   10. Glaucoma: will stop the following: cosopt; xiidra; travatan and refresh as she is declining to take these medications will monitor      MD is aware of resident's narcotic use and is in agreement with current plan of care. We will attempt to wean resident as apropriate   Ok Edwards NP [facility]  Contact [phone] Monday through Friday 8am- 5pm  After hours call [phone]

## 2018-05-15 DIAGNOSIS — J449 Chronic obstructive pulmonary disease, unspecified: Secondary | ICD-10-CM | POA: Diagnosis not present

## 2018-05-15 DIAGNOSIS — R1312 Dysphagia, oropharyngeal phase: Secondary | ICD-10-CM | POA: Diagnosis not present

## 2018-05-15 DIAGNOSIS — R278 Other lack of coordination: Secondary | ICD-10-CM | POA: Diagnosis not present

## 2018-05-16 DIAGNOSIS — R278 Other lack of coordination: Secondary | ICD-10-CM | POA: Diagnosis not present

## 2018-05-16 DIAGNOSIS — R52 Pain, unspecified: Secondary | ICD-10-CM

## 2018-05-16 DIAGNOSIS — G8929 Other chronic pain: Secondary | ICD-10-CM | POA: Insufficient documentation

## 2018-05-16 DIAGNOSIS — J449 Chronic obstructive pulmonary disease, unspecified: Secondary | ICD-10-CM | POA: Diagnosis not present

## 2018-05-16 DIAGNOSIS — R1312 Dysphagia, oropharyngeal phase: Secondary | ICD-10-CM | POA: Diagnosis not present

## 2018-05-19 DIAGNOSIS — R1312 Dysphagia, oropharyngeal phase: Secondary | ICD-10-CM | POA: Diagnosis not present

## 2018-05-19 DIAGNOSIS — R278 Other lack of coordination: Secondary | ICD-10-CM | POA: Diagnosis not present

## 2018-05-19 DIAGNOSIS — J449 Chronic obstructive pulmonary disease, unspecified: Secondary | ICD-10-CM | POA: Diagnosis not present

## 2018-05-20 DIAGNOSIS — J449 Chronic obstructive pulmonary disease, unspecified: Secondary | ICD-10-CM | POA: Diagnosis not present

## 2018-05-20 DIAGNOSIS — R1312 Dysphagia, oropharyngeal phase: Secondary | ICD-10-CM | POA: Diagnosis not present

## 2018-05-20 DIAGNOSIS — R278 Other lack of coordination: Secondary | ICD-10-CM | POA: Diagnosis not present

## 2018-05-21 DIAGNOSIS — R1312 Dysphagia, oropharyngeal phase: Secondary | ICD-10-CM | POA: Diagnosis not present

## 2018-05-21 DIAGNOSIS — R278 Other lack of coordination: Secondary | ICD-10-CM | POA: Diagnosis not present

## 2018-05-21 DIAGNOSIS — J449 Chronic obstructive pulmonary disease, unspecified: Secondary | ICD-10-CM | POA: Diagnosis not present

## 2018-05-22 DIAGNOSIS — R278 Other lack of coordination: Secondary | ICD-10-CM | POA: Diagnosis not present

## 2018-05-22 DIAGNOSIS — J449 Chronic obstructive pulmonary disease, unspecified: Secondary | ICD-10-CM | POA: Diagnosis not present

## 2018-05-22 DIAGNOSIS — R1312 Dysphagia, oropharyngeal phase: Secondary | ICD-10-CM | POA: Diagnosis not present

## 2018-05-23 DIAGNOSIS — R1312 Dysphagia, oropharyngeal phase: Secondary | ICD-10-CM | POA: Diagnosis not present

## 2018-05-23 DIAGNOSIS — R278 Other lack of coordination: Secondary | ICD-10-CM | POA: Diagnosis not present

## 2018-05-23 DIAGNOSIS — J449 Chronic obstructive pulmonary disease, unspecified: Secondary | ICD-10-CM | POA: Diagnosis not present

## 2018-05-26 DIAGNOSIS — R1312 Dysphagia, oropharyngeal phase: Secondary | ICD-10-CM | POA: Diagnosis not present

## 2018-05-26 DIAGNOSIS — R278 Other lack of coordination: Secondary | ICD-10-CM | POA: Diagnosis not present

## 2018-05-26 DIAGNOSIS — J449 Chronic obstructive pulmonary disease, unspecified: Secondary | ICD-10-CM | POA: Diagnosis not present

## 2018-05-27 DIAGNOSIS — J449 Chronic obstructive pulmonary disease, unspecified: Secondary | ICD-10-CM | POA: Diagnosis not present

## 2018-05-27 DIAGNOSIS — R278 Other lack of coordination: Secondary | ICD-10-CM | POA: Diagnosis not present

## 2018-05-27 DIAGNOSIS — R1312 Dysphagia, oropharyngeal phase: Secondary | ICD-10-CM | POA: Diagnosis not present

## 2018-05-28 DIAGNOSIS — R1312 Dysphagia, oropharyngeal phase: Secondary | ICD-10-CM | POA: Diagnosis not present

## 2018-05-28 DIAGNOSIS — J449 Chronic obstructive pulmonary disease, unspecified: Secondary | ICD-10-CM | POA: Diagnosis not present

## 2018-05-28 DIAGNOSIS — R278 Other lack of coordination: Secondary | ICD-10-CM | POA: Diagnosis not present

## 2018-05-29 DIAGNOSIS — R278 Other lack of coordination: Secondary | ICD-10-CM | POA: Diagnosis not present

## 2018-05-29 DIAGNOSIS — R1312 Dysphagia, oropharyngeal phase: Secondary | ICD-10-CM | POA: Diagnosis not present

## 2018-05-29 DIAGNOSIS — J449 Chronic obstructive pulmonary disease, unspecified: Secondary | ICD-10-CM | POA: Diagnosis not present

## 2018-05-30 DIAGNOSIS — J449 Chronic obstructive pulmonary disease, unspecified: Secondary | ICD-10-CM | POA: Diagnosis not present

## 2018-05-30 DIAGNOSIS — R278 Other lack of coordination: Secondary | ICD-10-CM | POA: Diagnosis not present

## 2018-05-30 DIAGNOSIS — R1312 Dysphagia, oropharyngeal phase: Secondary | ICD-10-CM | POA: Diagnosis not present

## 2018-06-02 DIAGNOSIS — J449 Chronic obstructive pulmonary disease, unspecified: Secondary | ICD-10-CM | POA: Diagnosis not present

## 2018-06-02 DIAGNOSIS — R278 Other lack of coordination: Secondary | ICD-10-CM | POA: Diagnosis not present

## 2018-06-02 DIAGNOSIS — R1312 Dysphagia, oropharyngeal phase: Secondary | ICD-10-CM | POA: Diagnosis not present

## 2018-06-03 DIAGNOSIS — R1312 Dysphagia, oropharyngeal phase: Secondary | ICD-10-CM | POA: Diagnosis not present

## 2018-06-03 DIAGNOSIS — R278 Other lack of coordination: Secondary | ICD-10-CM | POA: Diagnosis not present

## 2018-06-03 DIAGNOSIS — J449 Chronic obstructive pulmonary disease, unspecified: Secondary | ICD-10-CM | POA: Diagnosis not present

## 2018-06-04 ENCOUNTER — Inpatient Hospital Stay (HOSPITAL_COMMUNITY)
Admission: EM | Admit: 2018-06-04 | Discharge: 2018-06-08 | DRG: 871 | Disposition: A | Discharge status: Skilled Nursing Facility | Payer: Medicare Other | Source: Skilled Nursing Facility | Attending: Family Medicine | Admitting: Family Medicine

## 2018-06-04 ENCOUNTER — Encounter: Payer: Self-pay | Admitting: Internal Medicine

## 2018-06-04 ENCOUNTER — Encounter (HOSPITAL_COMMUNITY): Payer: Self-pay | Admitting: Emergency Medicine

## 2018-06-04 ENCOUNTER — Other Ambulatory Visit: Payer: Self-pay

## 2018-06-04 ENCOUNTER — Emergency Department (HOSPITAL_COMMUNITY): Payer: Medicare Other

## 2018-06-04 DIAGNOSIS — E43 Unspecified severe protein-calorie malnutrition: Secondary | ICD-10-CM | POA: Diagnosis present

## 2018-06-04 DIAGNOSIS — A4150 Gram-negative sepsis, unspecified: Secondary | ICD-10-CM | POA: Diagnosis not present

## 2018-06-04 DIAGNOSIS — M419 Scoliosis, unspecified: Secondary | ICD-10-CM | POA: Diagnosis present

## 2018-06-04 DIAGNOSIS — F0281 Dementia in other diseases classified elsewhere with behavioral disturbance: Secondary | ICD-10-CM | POA: Diagnosis not present

## 2018-06-04 DIAGNOSIS — L89221 Pressure ulcer of left hip, stage 1: Secondary | ICD-10-CM | POA: Diagnosis not present

## 2018-06-04 DIAGNOSIS — E46 Unspecified protein-calorie malnutrition: Secondary | ICD-10-CM | POA: Diagnosis not present

## 2018-06-04 DIAGNOSIS — G4700 Insomnia, unspecified: Secondary | ICD-10-CM | POA: Diagnosis present

## 2018-06-04 DIAGNOSIS — L89899 Pressure ulcer of other site, unspecified stage: Secondary | ICD-10-CM | POA: Diagnosis present

## 2018-06-04 DIAGNOSIS — Z8249 Family history of ischemic heart disease and other diseases of the circulatory system: Secondary | ICD-10-CM | POA: Diagnosis not present

## 2018-06-04 DIAGNOSIS — H409 Unspecified glaucoma: Secondary | ICD-10-CM | POA: Diagnosis present

## 2018-06-04 DIAGNOSIS — R4182 Altered mental status, unspecified: Secondary | ICD-10-CM | POA: Diagnosis not present

## 2018-06-04 DIAGNOSIS — Z5181 Encounter for therapeutic drug level monitoring: Secondary | ICD-10-CM | POA: Diagnosis not present

## 2018-06-04 DIAGNOSIS — Z882 Allergy status to sulfonamides status: Secondary | ICD-10-CM

## 2018-06-04 DIAGNOSIS — F0391 Unspecified dementia with behavioral disturbance: Secondary | ICD-10-CM | POA: Diagnosis present

## 2018-06-04 DIAGNOSIS — Z66 Do not resuscitate: Secondary | ICD-10-CM | POA: Diagnosis present

## 2018-06-04 DIAGNOSIS — R7881 Bacteremia: Secondary | ICD-10-CM | POA: Diagnosis present

## 2018-06-04 DIAGNOSIS — Z885 Allergy status to narcotic agent status: Secondary | ICD-10-CM | POA: Diagnosis not present

## 2018-06-04 DIAGNOSIS — Z833 Family history of diabetes mellitus: Secondary | ICD-10-CM

## 2018-06-04 DIAGNOSIS — A419 Sepsis, unspecified organism: Secondary | ICD-10-CM

## 2018-06-04 DIAGNOSIS — N184 Chronic kidney disease, stage 4 (severe): Secondary | ICD-10-CM | POA: Diagnosis present

## 2018-06-04 DIAGNOSIS — R8271 Bacteriuria: Secondary | ICD-10-CM | POA: Diagnosis not present

## 2018-06-04 DIAGNOSIS — L89152 Pressure ulcer of sacral region, stage 2: Secondary | ICD-10-CM | POA: Diagnosis not present

## 2018-06-04 DIAGNOSIS — K219 Gastro-esophageal reflux disease without esophagitis: Secondary | ICD-10-CM | POA: Diagnosis present

## 2018-06-04 DIAGNOSIS — H547 Unspecified visual loss: Secondary | ICD-10-CM | POA: Diagnosis not present

## 2018-06-04 DIAGNOSIS — Z886 Allergy status to analgesic agent status: Secondary | ICD-10-CM

## 2018-06-04 DIAGNOSIS — Z792 Long term (current) use of antibiotics: Secondary | ICD-10-CM | POA: Diagnosis not present

## 2018-06-04 DIAGNOSIS — R5381 Other malaise: Secondary | ICD-10-CM | POA: Diagnosis not present

## 2018-06-04 DIAGNOSIS — Z800 Family history of malignant neoplasm of digestive organs: Secondary | ICD-10-CM

## 2018-06-04 DIAGNOSIS — Z881 Allergy status to other antibiotic agents status: Secondary | ICD-10-CM

## 2018-06-04 DIAGNOSIS — Z823 Family history of stroke: Secondary | ICD-10-CM

## 2018-06-04 DIAGNOSIS — I10 Essential (primary) hypertension: Secondary | ICD-10-CM | POA: Diagnosis present

## 2018-06-04 DIAGNOSIS — B9562 Methicillin resistant Staphylococcus aureus infection as the cause of diseases classified elsewhere: Secondary | ICD-10-CM | POA: Diagnosis not present

## 2018-06-04 DIAGNOSIS — E559 Vitamin D deficiency, unspecified: Secondary | ICD-10-CM | POA: Diagnosis present

## 2018-06-04 DIAGNOSIS — N179 Acute kidney failure, unspecified: Secondary | ICD-10-CM

## 2018-06-04 DIAGNOSIS — M6281 Muscle weakness (generalized): Secondary | ICD-10-CM | POA: Diagnosis not present

## 2018-06-04 DIAGNOSIS — R6521 Severe sepsis with septic shock: Secondary | ICD-10-CM | POA: Diagnosis present

## 2018-06-04 DIAGNOSIS — R509 Fever, unspecified: Secondary | ICD-10-CM | POA: Diagnosis not present

## 2018-06-04 DIAGNOSIS — Z9181 History of falling: Secondary | ICD-10-CM | POA: Diagnosis not present

## 2018-06-04 DIAGNOSIS — J449 Chronic obstructive pulmonary disease, unspecified: Secondary | ICD-10-CM | POA: Diagnosis present

## 2018-06-04 DIAGNOSIS — D638 Anemia in other chronic diseases classified elsewhere: Secondary | ICD-10-CM | POA: Diagnosis not present

## 2018-06-04 DIAGNOSIS — N183 Chronic kidney disease, stage 3 unspecified: Secondary | ICD-10-CM | POA: Diagnosis present

## 2018-06-04 DIAGNOSIS — Z87891 Personal history of nicotine dependence: Secondary | ICD-10-CM

## 2018-06-04 DIAGNOSIS — Z888 Allergy status to other drugs, medicaments and biological substances status: Secondary | ICD-10-CM | POA: Diagnosis not present

## 2018-06-04 DIAGNOSIS — R6520 Severe sepsis without septic shock: Secondary | ICD-10-CM | POA: Diagnosis not present

## 2018-06-04 DIAGNOSIS — R404 Transient alteration of awareness: Secondary | ICD-10-CM | POA: Diagnosis not present

## 2018-06-04 DIAGNOSIS — A4102 Sepsis due to Methicillin resistant Staphylococcus aureus: Secondary | ICD-10-CM | POA: Diagnosis present

## 2018-06-04 DIAGNOSIS — E860 Dehydration: Secondary | ICD-10-CM

## 2018-06-04 DIAGNOSIS — Z681 Body mass index (BMI) 19 or less, adult: Secondary | ICD-10-CM | POA: Diagnosis not present

## 2018-06-04 DIAGNOSIS — R1312 Dysphagia, oropharyngeal phase: Secondary | ICD-10-CM | POA: Diagnosis not present

## 2018-06-04 DIAGNOSIS — Z7401 Bed confinement status: Secondary | ICD-10-CM | POA: Diagnosis not present

## 2018-06-04 DIAGNOSIS — M810 Age-related osteoporosis without current pathological fracture: Secondary | ICD-10-CM | POA: Diagnosis present

## 2018-06-04 DIAGNOSIS — H905 Unspecified sensorineural hearing loss: Secondary | ICD-10-CM | POA: Diagnosis present

## 2018-06-04 DIAGNOSIS — R410 Disorientation, unspecified: Secondary | ICD-10-CM | POA: Diagnosis not present

## 2018-06-04 DIAGNOSIS — Z20828 Contact with and (suspected) exposure to other viral communicable diseases: Secondary | ICD-10-CM | POA: Diagnosis present

## 2018-06-04 DIAGNOSIS — Z1159 Encounter for screening for other viral diseases: Secondary | ICD-10-CM | POA: Diagnosis not present

## 2018-06-04 DIAGNOSIS — I959 Hypotension, unspecified: Secondary | ICD-10-CM | POA: Diagnosis not present

## 2018-06-04 DIAGNOSIS — G9341 Metabolic encephalopathy: Secondary | ICD-10-CM | POA: Diagnosis present

## 2018-06-04 DIAGNOSIS — L8990 Pressure ulcer of unspecified site, unspecified stage: Secondary | ICD-10-CM | POA: Diagnosis not present

## 2018-06-04 DIAGNOSIS — Z880 Allergy status to penicillin: Secondary | ICD-10-CM | POA: Diagnosis not present

## 2018-06-04 DIAGNOSIS — N3000 Acute cystitis without hematuria: Secondary | ICD-10-CM

## 2018-06-04 DIAGNOSIS — Z993 Dependence on wheelchair: Secondary | ICD-10-CM

## 2018-06-04 DIAGNOSIS — G301 Alzheimer's disease with late onset: Secondary | ICD-10-CM | POA: Diagnosis not present

## 2018-06-04 DIAGNOSIS — M2550 Pain in unspecified joint: Secondary | ICD-10-CM | POA: Diagnosis not present

## 2018-06-04 DIAGNOSIS — E539 Vitamin B deficiency, unspecified: Secondary | ICD-10-CM | POA: Diagnosis present

## 2018-06-04 DIAGNOSIS — Z452 Encounter for adjustment and management of vascular access device: Secondary | ICD-10-CM | POA: Diagnosis not present

## 2018-06-04 DIAGNOSIS — N390 Urinary tract infection, site not specified: Secondary | ICD-10-CM | POA: Diagnosis present

## 2018-06-04 DIAGNOSIS — F03C0 Unspecified dementia, severe, without behavioral disturbance, psychotic disturbance, mood disturbance, and anxiety: Secondary | ICD-10-CM | POA: Diagnosis present

## 2018-06-04 DIAGNOSIS — I129 Hypertensive chronic kidney disease with stage 1 through stage 4 chronic kidney disease, or unspecified chronic kidney disease: Secondary | ICD-10-CM | POA: Diagnosis present

## 2018-06-04 DIAGNOSIS — R4020 Unspecified coma: Secondary | ICD-10-CM | POA: Diagnosis not present

## 2018-06-04 DIAGNOSIS — R2681 Unsteadiness on feet: Secondary | ICD-10-CM | POA: Diagnosis not present

## 2018-06-04 LAB — CBC WITH DIFFERENTIAL/PLATELET
Abs Immature Granulocytes: 0.05 10*3/uL (ref 0.00–0.07)
Basophils Absolute: 0 10*3/uL (ref 0.0–0.1)
Basophils Relative: 0 %
Eosinophils Absolute: 0 10*3/uL (ref 0.0–0.5)
Eosinophils Relative: 0 %
HCT: 36.8 % (ref 36.0–46.0)
Hemoglobin: 11.4 g/dL — ABNORMAL LOW (ref 12.0–15.0)
Immature Granulocytes: 1 %
Lymphocytes Relative: 9 %
Lymphs Abs: 1 10*3/uL (ref 0.7–4.0)
MCH: 30.2 pg (ref 26.0–34.0)
MCHC: 31 g/dL (ref 30.0–36.0)
MCV: 97.6 fL (ref 80.0–100.0)
Monocytes Absolute: 0.9 10*3/uL (ref 0.1–1.0)
Monocytes Relative: 9 %
Neutro Abs: 8.6 10*3/uL — ABNORMAL HIGH (ref 1.7–7.7)
Neutrophils Relative %: 81 %
Platelets: 163 10*3/uL (ref 150–400)
RBC: 3.77 MIL/uL — ABNORMAL LOW (ref 3.87–5.11)
RDW: 13.4 % (ref 11.5–15.5)
WBC: 10.6 10*3/uL — ABNORMAL HIGH (ref 4.0–10.5)
nRBC: 0 % (ref 0.0–0.2)

## 2018-06-04 LAB — COMPREHENSIVE METABOLIC PANEL
ALT: 16 U/L (ref 0–44)
AST: 22 U/L (ref 15–41)
Albumin: 2.8 g/dL — ABNORMAL LOW (ref 3.5–5.0)
Alkaline Phosphatase: 66 U/L (ref 38–126)
Anion gap: 10 (ref 5–15)
BUN: 25 mg/dL — ABNORMAL HIGH (ref 8–23)
CO2: 23 mmol/L (ref 22–32)
Calcium: 8.9 mg/dL (ref 8.9–10.3)
Chloride: 111 mmol/L (ref 98–111)
Creatinine, Ser: 1.16 mg/dL — ABNORMAL HIGH (ref 0.44–1.00)
GFR calc Af Amer: 49 mL/min — ABNORMAL LOW (ref >60)
GFR calc non Af Amer: 42 mL/min — ABNORMAL LOW (ref >60)
Glucose, Bld: 119 mg/dL — ABNORMAL HIGH (ref 70–99)
Potassium: 3.8 mmol/L (ref 3.5–5.1)
Sodium: 144 mmol/L (ref 135–145)
Total Bilirubin: 0.5 mg/dL (ref 0.3–1.2)
Total Protein: 5.9 g/dL — ABNORMAL LOW (ref 6.5–8.1)

## 2018-06-04 LAB — URINALYSIS, ROUTINE W REFLEX MICROSCOPIC
Bilirubin Urine: NEGATIVE
Glucose, UA: NEGATIVE mg/dL
Ketones, ur: NEGATIVE mg/dL
Nitrite: NEGATIVE
Protein, ur: 100 mg/dL — AB
Specific Gravity, Urine: 1.019 (ref 1.005–1.030)
WBC, UA: >50 WBC/hpf — ABNORMAL HIGH (ref 0–5)
pH: 6 (ref 5.0–8.0)

## 2018-06-04 LAB — LACTIC ACID, PLASMA
Lactic Acid, Venous: 1.4 mmol/L (ref 0.5–1.9)
Lactic Acid, Venous: 1.9 mmol/L (ref 0.5–1.9)

## 2018-06-04 LAB — SARS CORONAVIRUS 2 BY RT PCR (HOSPITAL ORDER, PERFORMED IN ~~LOC~~ HOSPITAL LAB): SARS Coronavirus 2: NEGATIVE

## 2018-06-04 LAB — TROPONIN I: Troponin I: 0.04 ng/mL (ref <0.03)

## 2018-06-04 MED ORDER — ENSURE ENLIVE PO LIQD
237.0000 mL | Freq: Four times a day (QID) | ORAL | Status: DC
  Administered 2018-06-04 – 2018-06-08 (×15): 237 mL via ORAL

## 2018-06-04 MED ORDER — VITAMIN D 25 MCG (1000 UNIT) PO TABS
1000.0000 [IU] | ORAL_TABLET | Freq: Every day | ORAL | Status: DC
  Administered 2018-06-05 – 2018-06-08 (×4): 1000 [IU] via ORAL
  Filled 2018-06-04 (×4): qty 1

## 2018-06-04 MED ORDER — POLYVINYL ALCOHOL 1.4 % OP SOLN
1.0000 [drp] | OPHTHALMIC | Status: DC | PRN
  Administered 2018-06-05 – 2018-06-07 (×2): 1 [drp] via OPHTHALMIC
  Filled 2018-06-04: qty 15

## 2018-06-04 MED ORDER — VITAMIN B-12 1000 MCG PO TABS
1000.0000 ug | ORAL_TABLET | Freq: Every day | ORAL | Status: DC
  Administered 2018-06-05 – 2018-06-08 (×4): 1000 ug via ORAL
  Filled 2018-06-04 (×4): qty 1

## 2018-06-04 MED ORDER — BIOTIN 5000 MCG PO CAPS: 1.0000 | ORAL_CAPSULE | Freq: Every day | ORAL | Status: DC

## 2018-06-04 MED ORDER — LORATADINE 10 MG PO TABS
10.0000 mg | ORAL_TABLET | Freq: Every day | ORAL | Status: DC
  Administered 2018-06-05 – 2018-06-08 (×4): 10 mg via ORAL
  Filled 2018-06-04 (×4): qty 1

## 2018-06-04 MED ORDER — SODIUM CHLORIDE 0.9 % IV SOLN
1.0000 g | INTRAVENOUS | Status: DC
  Filled 2018-06-04: qty 10

## 2018-06-04 MED ORDER — SODIUM CHLORIDE 0.9 % IV BOLUS (SEPSIS)
1000.0000 mL | Freq: Once | INTRAVENOUS | Status: AC
  Administered 2018-06-04: 11:00:00 1000 mL via INTRAVENOUS

## 2018-06-04 MED ORDER — HYDROCODONE-ACETAMINOPHEN 5-325 MG PO TABS
1.0000 | ORAL_TABLET | Freq: Every day | ORAL | Status: DC
  Administered 2018-06-04: 1 via ORAL
  Filled 2018-06-04: qty 1

## 2018-06-04 MED ORDER — QUETIAPINE FUMARATE 300 MG PO TABS
300.0000 mg | ORAL_TABLET | Freq: Every day | ORAL | Status: DC
  Administered 2018-06-04: 300 mg via ORAL
  Filled 2018-06-04: qty 1

## 2018-06-04 MED ORDER — LACTULOSE 10 GM/15ML PO SOLN: 10.0000 g | Freq: Every day | ORAL | Status: DC | PRN

## 2018-06-04 MED ORDER — MIRTAZAPINE 15 MG PO TABS
7.5000 mg | ORAL_TABLET | Freq: Every day | ORAL | Status: DC
  Administered 2018-06-04 – 2018-06-07 (×4): 7.5 mg via ORAL
  Filled 2018-06-04 (×4): qty 1

## 2018-06-04 MED ORDER — ACETAMINOPHEN 325 MG PO TABS
650.0000 mg | ORAL_TABLET | Freq: Four times a day (QID) | ORAL | Status: DC | PRN
  Administered 2018-06-05: 650 mg via ORAL
  Filled 2018-06-04: qty 2

## 2018-06-04 MED ORDER — ONDANSETRON HCL 4 MG/2ML IJ SOLN: 4.0000 mg | Freq: Four times a day (QID) | INTRAMUSCULAR | Status: DC | PRN

## 2018-06-04 MED ORDER — SODIUM CHLORIDE 0.9 % IV SOLN
1.0000 g | Freq: Once | INTRAVENOUS | Status: AC
  Administered 2018-06-04: 1 g via INTRAVENOUS
  Filled 2018-06-04: qty 10

## 2018-06-04 MED ORDER — DOCUSATE SODIUM 100 MG PO CAPS
100.0000 mg | ORAL_CAPSULE | Freq: Two times a day (BID) | ORAL | Status: DC
  Administered 2018-06-04 – 2018-06-08 (×8): 100 mg via ORAL
  Filled 2018-06-04 (×8): qty 1

## 2018-06-04 MED ORDER — CRANBERRY 450 MG PO TABS: 1.0000 | ORAL_TABLET | Freq: Two times a day (BID) | ORAL | Status: DC

## 2018-06-04 MED ORDER — CLONAZEPAM 0.5 MG PO TABS
0.2500 mg | ORAL_TABLET | Freq: Three times a day (TID) | ORAL | Status: DC
  Administered 2018-06-04 (×2): 0.25 mg via ORAL
  Filled 2018-06-04 (×2): qty 1

## 2018-06-04 MED ORDER — SODIUM CHLORIDE 0.9 % IV BOLUS (SEPSIS)
250.0000 mL | Freq: Once | INTRAVENOUS | Status: AC
  Administered 2018-06-04: 250 mL via INTRAVENOUS

## 2018-06-04 MED ORDER — MAGNESIUM HYDROXIDE 400 MG/5ML PO SUSP: 30.0000 mL | Freq: Every day | ORAL | Status: DC | PRN

## 2018-06-04 MED ORDER — BISACODYL 10 MG RE SUPP: 10.0000 mg | RECTAL | Status: DC | PRN

## 2018-06-04 MED ORDER — FLUDROCORTISONE ACETATE 0.1 MG PO TABS
0.1000 mg | ORAL_TABLET | ORAL | Status: DC
  Administered 2018-06-05 – 2018-06-08 (×4): 0.1 mg via ORAL
  Filled 2018-06-04 (×5): qty 1

## 2018-06-04 MED ORDER — ONDANSETRON HCL 4 MG PO TABS: 4.0000 mg | ORAL_TABLET | Freq: Four times a day (QID) | ORAL | Status: DC | PRN

## 2018-06-04 MED ORDER — POLYETHYL GLYCOL-PROPYL GLYCOL 0.4-0.3 % OP SOLN: 2.0000 [drp] | OPHTHALMIC | Status: DC | PRN

## 2018-06-04 MED ORDER — SENNA 8.6 MG PO TABS: 2.0000 | ORAL_TABLET | Freq: Two times a day (BID) | ORAL | Status: DC | PRN

## 2018-06-04 MED ORDER — QUETIAPINE FUMARATE 25 MG PO TABS
50.0000 mg | ORAL_TABLET | Freq: Every day | ORAL | Status: DC
  Administered 2018-06-04: 50 mg via ORAL
  Filled 2018-06-04: qty 2

## 2018-06-04 MED ORDER — POLYETHYLENE GLYCOL 3350 17 G PO PACK
17.0000 g | PACK | Freq: Every day | ORAL | Status: DC
  Administered 2018-06-05 – 2018-06-08 (×3): 17 g via ORAL
  Filled 2018-06-04 (×3): qty 1

## 2018-06-04 MED ORDER — SODIUM CHLORIDE 0.9 % IV SOLN
INTRAVENOUS | Status: DC
  Administered 2018-06-04 – 2018-06-05 (×3): via INTRAVENOUS

## 2018-06-04 MED ORDER — DORZOLAMIDE HCL-TIMOLOL MAL 2-0.5 % OP SOLN
1.0000 [drp] | Freq: Two times a day (BID) | OPHTHALMIC | Status: DC
  Administered 2018-06-04 – 2018-06-08 (×7): 1 [drp] via OPHTHALMIC
  Filled 2018-06-04: qty 10

## 2018-06-04 MED ORDER — TRAVOPROST (BAK FREE) 0.004 % OP SOLN
1.0000 [drp] | Freq: Every day | OPHTHALMIC | Status: DC
  Administered 2018-06-04 – 2018-06-07 (×4): 1 [drp] via OPHTHALMIC
  Filled 2018-06-04: qty 2.5

## 2018-06-04 MED ORDER — ALBUTEROL SULFATE (2.5 MG/3ML) 0.083% IN NEBU: 3.0000 mL | INHALATION_SOLUTION | Freq: Four times a day (QID) | RESPIRATORY_TRACT | Status: DC | PRN

## 2018-06-04 MED ORDER — FERROUS SULFATE 325 (65 FE) MG PO TABS
325.0000 mg | ORAL_TABLET | Freq: Every day | ORAL | Status: DC
  Administered 2018-06-05 – 2018-06-08 (×4): 325 mg via ORAL
  Filled 2018-06-04 (×4): qty 1

## 2018-06-04 MED ORDER — HEPARIN SODIUM (PORCINE) 5000 UNIT/ML IJ SOLN
5000.0000 [IU] | Freq: Three times a day (TID) | INTRAMUSCULAR | Status: DC
  Administered 2018-06-04 – 2018-06-08 (×12): 5000 [IU] via SUBCUTANEOUS
  Filled 2018-06-04 (×12): qty 1

## 2018-06-04 MED ORDER — NITROGLYCERIN 0.4 MG SL SUBL: 0.4000 mg | SUBLINGUAL_TABLET | SUBLINGUAL | Status: DC | PRN

## 2018-06-04 NOTE — Progress Notes (Signed)
Location:  [facility] Room Number: 096E Place of Service:  SNF [phone])  Taylor Roth. [name], MD  Patient Care Team: [name], MD as PCP - General (Internal Medicine) [name], MD (Gastroenterology) [name], NP (Obstetrics and Gynecology)  Extended Emergency Contact Information Primary Emergency Contact: Roth,Taylor Address: [place], [place] Phone: [phone] Mobile Phone: [phone] Relation: Daughter Secondary Emergency Contact: Taylor Roth [place] Phone: [phone] Mobile Phone: [phone] Relation: Daughter    Allergies: Amoxicillin; Ampicillin; Depakote [valproic acid]; Macrobid [nitrofurantoin macrocrystal]; Penicillins; Azithromycin; Doxycycline; Escitalopram oxalate; Fioricet [butalbital-apap-caffeine]; Latex; Levofloxacin; Nsaids; Oxycodone; Restasis [cyclosporine]; Sulfa antibiotics; and Biaxin [clarithromycin]  Chief Complaint  Patient presents with  . Medical Management of Chronic Issues    Routine visit    HPI: Patient is 85 y.o. female who   Past Medical History:  Diagnosis Date  . Anxiety   . Arterial tortuosity (aorta) 12/11/2012  . Asthma   . Chronic kidney disease, stage IV (severe) ([place]) 12/11/2012  . Chronic mental illness   . CKD (chronic kidney disease), stage III ([place]) 01/14/2015  . Closed hip fracture requiring operative repair, right, with routine healing, subsequent encounter 12/19/2015  . Colitis   . COPD (chronic obstructive pulmonary disease) ([place])   . Decreased rectal sphincter tone 11/21/2012  . Depression   . Drug-seeking behavior 02/26/2011   Use of multiple md to obtain benzos/narcotics pt must sign contract to receive controlled meds.   Marland Kitchen Dysphagia 01/11/2015  . GERD (gastroesophageal reflux disease)   . Glaucoma 06/26/2011  . Glaucoma of both eyes   . Hearing loss, sensorineural  03/04/2014   [facility] ENT; 02/26/14. There is not a medical or surgical treatment that would benefit her type of hearing loss. We discussed amplification in an overview fashion.    Marland Kitchen History of fall   . Insomnia, unspecified   . Macular degeneration 06/26/2011  . Migraines   . Nasal bone fracture 03/30/2014  . Osteoporosis   . Osteoporosis 02/26/2011  . Presence of right artificial hip joint   . Protein-calorie malnutrition, severe ([place]) 12/10/2012  . S/P ORIF (open reduction internal fixation) fracture 01/08/2016  . Schatzki's ring 01/14/2015  . Scoliosis   . Substance abuse ([place])    Benbzos and Narcotics, she does not get any of those medicines from [facility], we have empathetically told her that.  . Vertigo   . Vitamin B deficiency   . Vitamin D deficiency 08/25/2016  . Weight loss     Past Surgical History:  Procedure Laterality Date  . ABDOMINAL HYSTERECTOMY    . CATARACT EXTRACTION    . CHOLECYSTECTOMY    . ESOPHAGOGASTRODUODENOSCOPY (EGD) WITH PROPOFOL N/A 01/13/2015   Procedure: ESOPHAGOGASTRODUODENOSCOPY (EGD) WITH PROPOFOL;  Surgeon: Taylor Horner, MD;  Location: [facility];  Service: Endoscopy;  Laterality: N/A;  . HIP ARTHROPLASTY Right 12/21/2015   Procedure: ARTHROPLASTY BIPOLAR HIP (HEMIARTHROPLASTY);  Surgeon: [name], MD;  Location: [facility];  Service: Orthopedics;  Laterality: Right;  . PARTIAL HYSTERECTOMY      Allergies as of 06/04/2018      Reactions   Amoxicillin    Per daughter   Ampicillin    Per daughter   Depakote [valproic Acid]    DOES NOT WANT HER TO TAKE IT==Makes her hair fall out= per daughter who is poa   Macrobid [nitrofurantoin Macrocrystal] Rash  Per MAR   Penicillins    Per daughter   Azithromycin Other (See Comments)   Per MAR   Doxycycline Other (See Comments)   Per MAR   Escitalopram Oxalate Other (See Comments)   Per MAR   Fioricet [butalbital-apap-caffeine] Other (See Comments)   Drunk. Per MAR   Latex Other (See  Comments)   Per MAR   Levofloxacin Other (See Comments)   Per MAR   Nsaids Other (See Comments)   This is not an allergy. The patient was told by Dr. Rockne Menghini to avoid NSAIDs and stop Vicoprofen in to 2014 to avoid nephrotoxicity. Per MAR.   Oxycodone Other (See Comments)   reaction to synthetic codeine Per MAR   Restasis [cyclosporine] Other (See Comments)   Per MAR   Sulfa Antibiotics Other (See Comments)   Per MAR   Biaxin [clarithromycin] Rash   With burning sensation Per [facility]      Medication List       Accurate as of June 04, 2018 10:55 AM. Always use your most recent med list.        acetaminophen 325 MG tablet Commonly known as:  TYLENOL Take 650 mg by mouth every 6 (six) hours as needed for headache.   albuterol 108 (90 Base) MCG/ACT inhaler Commonly known as:  VENTOLIN HFA Inhale 1 puff into the lungs every 6 (six) hours as needed for shortness of breath.   Biotin 5000 MCG Caps Take 1 capsule by mouth daily.   bisacodyl 10 MG suppository Commonly known as:  DULCOLAX Place 10 mg rectally as needed for moderate constipation.   Claritin 10 MG tablet Generic drug:  loratadine Take 10 mg by mouth daily.   clonazePAM 0.5 MG tablet Commonly known as:  KLONOPIN Take 0.5 tablets (0.25 mg total) by mouth 3 (three) times daily.   Cranberry 450 MG Tabs Take 1 tablet by mouth 2 (two) times daily.   docusate sodium 100 MG capsule Commonly known as:  COLACE Take 100 mg by mouth 2 (two) times daily.   dorzolamide-timolol 22.3-6.8 MG/ML ophthalmic solution Commonly known as:  COSOPT Place 1 drop into both eyes 2 (two) times daily.   Ensure Take 237 mLs by mouth 4 (four) times daily. D/t weight loss   ferrous sulfate 325 (65 FE) MG tablet Take 325 mg by mouth daily.   fludrocortisone 0.1 MG tablet Commonly known as:  FLORINEF Take 0.1 mg by mouth every morning. for orthostatic hypotension   HYDROcodone-acetaminophen 5-325 MG tablet Commonly known as:   NORCO/VICODIN Take 1 tablet by mouth at bedtime. At 9 am , 1 pm , and 9 pm for pain   lactulose 10 GM/15ML solution Commonly known as:  CHRONULAC Take 15 mLs (10 g total) by mouth daily as needed for severe constipation.   magnesium hydroxide 400 MG/5ML suspension Commonly known as:  MILK OF MAGNESIA Take 30 mLs by mouth daily as needed for mild constipation.   mirtazapine 15 MG tablet Commonly known as:  REMERON Take 7.5 mg by mouth at bedtime.   nitroGLYCERIN 0.4 MG SL tablet Commonly known as:  NITROSTAT Place 0.4 mg under the tongue every 5 (five) minutes as needed for chest pain. May use 3 times   polyethylene glycol 17 g packet Commonly known as:  MIRALAX / GLYCOLAX Take 17 g by mouth daily.   QUEtiapine 300 MG tablet Commonly known as:  SEROQUEL Take 300 mg by mouth at bedtime. Give with a 50 mg tablet to = 350  mg   QUEtiapine 100 MG tablet Commonly known as:  SEROQUEL Take 100 mg by mouth at bedtime.   senna 8.6 MG tablet Commonly known as:  SENOKOT Take 2 tablets by mouth 2 (two) times daily as needed for constipation.   Systane 0.4-0.3 % Soln Generic drug:  Polyethyl Glycol-Propyl Glycol Place 2 drops into both eyes every 2 (two) hours as needed.   travoprost (benzalkonium) 0.004 % ophthalmic solution Commonly known as:  TRAVATAN Place 1 drop into both eyes at bedtime.   vitamin B-12 1000 MCG tablet Commonly known as:  CYANOCOBALAMIN Take 1,000 mcg by mouth daily.   Vitamin D 1000 units capsule Take 1,000 Units by mouth daily with breakfast.       No orders of the defined types were placed in this encounter.   Immunization History  Administered Date(s) Administered  . Influenza Split 11/13/2011  . Influenza-Unspecified 02/13/2008, 11/30/2013, 12/20/2016  . PPD Test 12/15/2015  . Pneumococcal-Unspecified 02/13/2008, 10/16/2016  . Td 02/12/2009    Social History   Tobacco Use  . Smoking status: Former Smoker    Packs/day: 0.50    Types:  Cigarettes  . Smokeless tobacco: Never Used  Substance Use Topics  . Alcohol use: No    Review of Systems  DATA OBTAINED: from patient, nurse, medical record, family member GENERAL:  no fevers, fatigue, appetite changes SKIN: No itching, rash HEENT: No complaint RESPIRATORY: No cough, wheezing, SOB CARDIAC: No chest pain, palpitations, lower extremity edema  GI: No abdominal pain, No N/V/D or constipation, No heartburn or reflux  GU: No dysuria, frequency or urgency, or incontinence  MUSCULOSKELETAL: No unrelieved bone/joint pain NEUROLOGIC: No headache, dizziness  PSYCHIATRIC: No overt anxiety or sadness  Vitals:   06/04/18 1043  BP: 90/62  Pulse: (!) 102  Resp: 20  Temp: 98.8 F (37.1 C)   Body mass index is 16.8 kg/m. Physical Exam  GENERAL APPEARANCE: Alert, conversant, No acute distress  SKIN: No diaphoresis rash HEENT: Unremarkable RESPIRATORY: Breathing is even, unlabored. Lung sounds are clear   CARDIOVASCULAR: Heart RRR no murmurs, rubs or gallops. No peripheral edema  GASTROINTESTINAL: Abdomen is soft, non-tender, not distended w/ normal bowel sounds.  GENITOURINARY: Bladder non tender, not distended  MUSCULOSKELETAL: No abnormal joints or musculature NEUROLOGIC: Cranial nerves 2-12 grossly intact. Moves all extremities PSYCHIATRIC: Mood and affect appropriate to situation, no behavioral issues  Patient Active Problem List   Diagnosis Date Noted  . Chronic generalized pain 05/16/2018  . Dry eye syndrome of both eyes 02/28/2018  . Abnormal EKG 01/24/2017  . Delusions ([place])   . Paranoia ([place])   . Vitamin D deficiency 08/25/2016  . Dementia with behavioral problem ([place]) 03/25/2016  . S/P ORIF (open reduction internal fixation) fracture 01/08/2016  . Oral thrush 01/08/2016  . HTN (hypertension) 01/08/2016  . Closed hip fracture requiring operative repair, right, with routine healing, subsequent encounter 12/19/2015  . Bipolar disease, chronic ([place])  12/17/2015  . Decubitus ulcer of hip, stage 3 ([place]) 12/11/2015  . Legally blind 08/16/2015  . Ankle pain   . Encephalopathy   . Altered mental status 01/26/2015  . Nausea and vomiting 01/17/2015  . Esophageal dysmotilities   . Urinary tract infectious disease   . Weakness 01/14/2015  . Schatzki's ring 01/14/2015  . CKD (chronic kidney disease), stage III ([place]) 01/14/2015  . Esophagus disorder   . Dysphagia 01/11/2015  . UTI (lower urinary tract infection) 03/30/2014  . Hearing loss, sensorineural 03/04/2014  . Nasal lesion 03/04/2014  .  Constipation 12/12/2012  . Hypokalemia 12/11/2012  . Orthostatic hypotension 12/11/2012  . Arterial tortuosity (aorta) 12/11/2012  . Protein-calorie malnutrition, severe ([place]) 12/10/2012  . Adult failure to thrive 12/09/2012  . Decreased rectal sphincter tone 11/21/2012  . Primary open angle glaucoma of both eyes, severe stage 11/11/2012  . Idiopathic scoliosis 07/08/2012  . Macular atrophy, retinal 06/26/2011  . Vision disturbance 06/26/2011  . Glaucoma 06/26/2011  . Nonexudative senile macular degeneration of retina 06/21/2011  . Macula scar of posterior pole 06/21/2011  . Status post intraocular lens implant 06/21/2011  . Anemia of chronic disease 04/17/2011  . Drug-seeking behavior 02/26/2011  . Osteoporosis 02/26/2011  . Depression   . Anxiety   . COPD (chronic obstructive pulmonary disease) ([place])   . GERD (gastroesophageal reflux disease)   . Migraines   . Insomnia, unspecified     CMP     Component Value Date/Time   NA 140 07/31/2017   K 5.0 07/31/2017   CL 109 12/12/2016 1428   CO2 22 12/12/2016 1428   GLUCOSE 90 12/12/2016 1428   BUN 23 (A) 07/31/2017   CREATININE 0.62 07/31/2017   CALCIUM 8.9 07/31/2017   PROT 5.8 07/31/2017   ALBUMIN 3.5 07/31/2017   AST 14 07/31/2017   ALT 5 07/31/2017   ALKPHOS 87 07/31/2017   BILITOT 0.3 07/31/2017   GFRNONAA 81.51 07/31/2017   GFRNONAA 64 11/09/2013 1135   GFRAA >60  12/12/2016 1428   GFRAA 74 11/09/2013 1135   Recent Labs    07/31/17  NA 140  K 5.0  BUN 23*  CREATININE 0.62  CALCIUM 8.9   Recent Labs    07/31/17  AST 14  ALT 5  ALKPHOS 87  BILITOT 0.3  PROT 5.8  ALBUMIN 3.5   Recent Labs    07/31/17  WBC 5.7  HGB 12.4  HCT 37.3  MCV 87.2   Recent Labs    07/31/17  CHOL 134  LDLCALC 61  TRIG 102   No results found for: [facility] Lab Results  Component Value Date   TSH 0.59 07/31/2017   Lab Results  Component Value Date   HGBA1C 5.4 06/11/2016   Lab Results  Component Value Date   CHOL 134 07/31/2017   HDL 54 06/11/2016   LDLCALC 61 07/31/2017   TRIG 102 07/31/2017    Significant Diagnostic Results in last 30 days:  No results found.  Assessment and Plan  No problem-specific Assessment & Plan notes found for this encounter.   Labs/tests ordered:    Taylor Roth. [name], MD

## 2018-06-04 NOTE — ED Provider Notes (Signed)
[facility] EMERGENCY DEPARTMENT Provider Note   CSN: [id number] Arrival date & time: 06/04/18  1050    History   Chief Complaint Chief Complaint  Patient presents with  . Altered Mental Status  . Hypotension    HPI Taylor Roth is a 85 y.o. female.     Pt presents to the ED today with altered mental status.  She is a resident at [facility] and they noticed her AMS this am around 0800 when they tried to feed her.  Her BP was 73/50 and temp was 101.4.  She was given 650 mg tylenol at the facility.  EMS gave the pt 1L NS en route.  Pt is unable to give any hx.  She has advanced dementia and is DNR.      Past Medical History:  Diagnosis Date  . Anxiety   . Arterial tortuosity (aorta) 12/11/2012  . Asthma   . Chronic kidney disease, stage IV (severe) ([place]) 12/11/2012  . Chronic mental illness   . CKD (chronic kidney disease), stage III ([place]) 01/14/2015  . Closed hip fracture requiring operative repair, right, with routine healing, subsequent encounter 12/19/2015  . Colitis   . COPD (chronic obstructive pulmonary disease) ([place])   . Decreased rectal sphincter tone 11/21/2012  . Depression   . Drug-seeking behavior 02/26/2011   Use of multiple md to obtain benzos/narcotics pt must sign contract to receive controlled meds.   Marland Kitchen Dysphagia 01/11/2015  . GERD (gastroesophageal reflux disease)   . Glaucoma 06/26/2011  . Glaucoma of both eyes   . Hearing loss, sensorineural 03/04/2014   [facility] ENT; 02/26/14. There is not a medical or surgical treatment that would benefit her type of hearing loss. We discussed amplification in an overview fashion.    Marland Kitchen History of fall   . Insomnia, unspecified   . Macular degeneration 06/26/2011  . Migraines   . Nasal bone fracture 03/30/2014  . Osteoporosis   . Osteoporosis 02/26/2011  . Presence of right artificial hip joint   . Protein-calorie malnutrition, severe ([place]) 12/10/2012  . S/P ORIF (open reduction internal  fixation) fracture 01/08/2016  . Schatzki's ring 01/14/2015  . Scoliosis   . Substance abuse ([place])    Benbzos and Narcotics, she does not get any of those medicines from [facility] we have empathetically told her that.  . Vertigo   . Vitamin B deficiency   . Vitamin D deficiency 08/25/2016  . Weight loss     Patient Active Problem List   Diagnosis Date Noted  . Sepsis, Gram negative ([place]) 06/04/2018  . Chronic generalized pain 05/16/2018  . Dry eye syndrome of both eyes 02/28/2018  . Abnormal EKG 01/24/2017  . Delusions ([place])   . Paranoia ([place])   . Vitamin D deficiency 08/25/2016  . Dementia with behavioral problem ([place]) 03/25/2016  . S/P ORIF (open reduction internal fixation) fracture 01/08/2016  . Oral thrush 01/08/2016  . HTN (hypertension) 01/08/2016  . Closed hip fracture requiring operative repair, right, with routine healing, subsequent encounter 12/19/2015  . Bipolar disease, chronic ([place]) 12/17/2015  . Decubitus ulcer of hip, stage 3 ([place]) 12/11/2015  . Legally blind 08/16/2015  . Ankle pain   . Encephalopathy   . Altered mental status 01/26/2015  . Nausea and vomiting 01/17/2015  . Esophageal dysmotilities   . Urinary tract infectious disease   . Weakness 01/14/2015  . Schatzki's ring 01/14/2015  . CKD (chronic kidney disease), stage III ([place]) 01/14/2015  . Esophagus disorder   .  Dysphagia 01/11/2015  . Lower urinary tract infectious disease 03/30/2014  . Hearing loss, sensorineural 03/04/2014  . Nasal lesion 03/04/2014  . Constipation 12/12/2012  . Hypokalemia 12/11/2012  . Orthostatic hypotension 12/11/2012  . Arterial tortuosity (aorta) 12/11/2012  . Protein-calorie malnutrition, severe ([place]) 12/10/2012  . Adult failure to thrive 12/09/2012  . Decreased rectal sphincter tone 11/21/2012  . Primary open angle glaucoma of both eyes, severe stage 11/11/2012  . Idiopathic scoliosis 07/08/2012  . Macular atrophy, retinal 06/26/2011  . Vision disturbance 06/26/2011   . Glaucoma 06/26/2011  . Nonexudative senile macular degeneration of retina 06/21/2011  . Macula scar of posterior pole 06/21/2011  . Status post intraocular lens implant 06/21/2011  . Anemia of chronic disease 04/17/2011  . Drug-seeking behavior 02/26/2011  . Osteoporosis 02/26/2011  . Depression   . Anxiety   . COPD (chronic obstructive pulmonary disease) ([place])   . GERD (gastroesophageal reflux disease)   . Migraines   . Insomnia, unspecified     Past Surgical History:  Procedure Laterality Date  . ABDOMINAL HYSTERECTOMY    . CATARACT EXTRACTION    . CHOLECYSTECTOMY    . ESOPHAGOGASTRODUODENOSCOPY (EGD) WITH PROPOFOL N/A 01/13/2015   Procedure: ESOPHAGOGASTRODUODENOSCOPY (EGD) WITH PROPOFOL;  Surgeon: [name], MD;  Location: [facility];  Service: Endoscopy;  Laterality: N/A;  . HIP ARTHROPLASTY Right 12/21/2015   Procedure: ARTHROPLASTY BIPOLAR HIP (HEMIARTHROPLASTY);  Surgeon: [name], MD;  Location: [facility];  Service: Orthopedics;  Laterality: Right;  . PARTIAL HYSTERECTOMY       OB History   No obstetric history on file.      Home Medications    Prior to Admission medications   Medication Sig Start Date End Date Taking? Authorizing Provider  acetaminophen (TYLENOL) 325 MG tablet Take 650 mg by mouth every 6 (six) hours as needed for headache.    [provider]  albuterol (PROVENTIL HFA;VENTOLIN HFA) 108 (90 BASE) MCG/ACT inhaler Inhale 1 puff into the lungs every 6 (six) hours as needed for shortness of breath.     [provider]  Biotin 5000 MCG CAPS Take 1 capsule by mouth daily.     [provider]  bisacodyl (DULCOLAX) 10 MG suppository Place 10 mg rectally as needed for moderate constipation.    [provider]  Cholecalciferol (VITAMIN D) 1000 UNITS capsule Take 1,000 Units by mouth daily with breakfast.     [provider]  clonazePAM (KLONOPIN) 0.5 MG tablet Take 0.5 tablets (0.25 mg total) by  mouth 3 (three) times daily. 05/07/18   Gerlene Fee, NP  Cranberry 450 MG TABS Take 1 tablet by mouth 2 (two) times daily.    [provider]  docusate sodium (COLACE) 100 MG capsule Take 100 mg by mouth 2 (two) times daily.    [provider]  dorzolamide-timolol (COSOPT) 22.3-6.8 MG/ML ophthalmic solution Place 1 drop into both eyes 2 (two) times daily.    [provider]  ENSURE (ENSURE) Take 237 mLs by mouth 4 (four) times daily. D/t weight loss    [provider]  ferrous sulfate 325 (65 FE) MG tablet Take 325 mg by mouth daily.     [provider]  fludrocortisone (FLORINEF) 0.1 MG tablet Take 0.1 mg by mouth every morning. for orthostatic hypotension    [provider]  HYDROcodone-acetaminophen (NORCO/VICODIN) 5-325 MG tablet Take 1 tablet by mouth at bedtime. At 9 am , 1 pm , and 9 pm for pain 05/14/18  Gerlene Fee, NP  lactulose (CHRONULAC) 10 GM/15ML solution Take 15 mLs (10 g total) by mouth daily as needed for severe constipation. 08/12/15   [name], MD  loratadine (CLARITIN) 10 MG tablet Take 10 mg by mouth daily.    [provider]  magnesium hydroxide (MILK OF MAGNESIA) 400 MG/5ML suspension Take 30 mLs by mouth daily as needed for mild constipation.    [provider]  mirtazapine (REMERON) 15 MG tablet Take 7.5 mg by mouth at bedtime.    [provider]  nitroGLYCERIN (NITROSTAT) 0.4 MG SL tablet Place 0.4 mg under the tongue every 5 (five) minutes as needed for chest pain. May use 3 times    [provider]  Polyethyl Glycol-Propyl Glycol (SYSTANE) 0.4-0.3 % SOLN Place 2 drops into both eyes every 2 (two) hours as needed.     [provider]  polyethylene glycol (MIRALAX / GLYCOLAX) packet Take 17 g by mouth daily.     [provider]  QUEtiapine (SEROQUEL) 100 MG tablet Take 100 mg by mouth at bedtime.    [provider]  QUEtiapine (SEROQUEL) 300 MG  tablet Take 300 mg by mouth at bedtime. Give with a 50 mg tablet to = 350 mg    [provider]  senna (SENOKOT) 8.6 MG tablet Take 2 tablets by mouth 2 (two) times daily as needed for constipation.    [provider]  travoprost, benzalkonium, (TRAVATAN) 0.004 % ophthalmic solution Place 1 drop into both eyes at bedtime.    [provider]  vitamin B-12 (CYANOCOBALAMIN) 1000 MCG tablet Take 1,000 mcg by mouth daily.    [provider]    Family History Family History  Problem Relation Age of Onset  . Stroke Mother   . Diabetes Mother   . Heart disease Mother   . Hearing loss Mother   . Cancer Father 68       esophageal    Social History Social History   Tobacco Use  . Smoking status: Former Smoker    Packs/day: 0.50    Types: Cigarettes  . Smokeless tobacco: Never Used  Substance Use Topics  . Alcohol use: No  . Drug use: No     Allergies   Amoxicillin; Ampicillin; Depakote [valproic acid]; Macrobid [nitrofurantoin macrocrystal]; Penicillins; Azithromycin; Doxycycline; Escitalopram oxalate; Fioricet [butalbital-apap-caffeine]; Latex; Levofloxacin; Nsaids; Oxycodone; Restasis [cyclosporine]; Sulfa antibiotics; and Biaxin [clarithromycin]   Review of Systems Review of Systems  Unable to perform ROS: Mental status change     Physical Exam Updated Vital Signs BP 108/61   Pulse 80   Temp 99.7 F (37.6 C) (Rectal)   Resp 19   Ht 4\' 11"  (1.499 m)   Wt 37 kg   LMP  (LMP Unknown)   SpO2 97%   BMI 16.48 kg/m   Physical Exam Vitals signs and nursing note reviewed.  Constitutional:      Appearance: She is underweight. She is ill-appearing.  HENT:     Head: Normocephalic and atraumatic.     Right Ear: External ear normal.     Left Ear: External ear normal.     Nose: Nose normal.     Mouth/Throat:     Mouth: Mucous membranes are dry.  Eyes:     Pupils: Pupils are equal, round, and reactive to light.  Cardiovascular:     Rate  and Rhythm: Normal rate and regular rhythm.     Pulses: Normal pulses.     Heart sounds:  Normal heart sounds.  Pulmonary:     Effort: Pulmonary effort is normal.     Breath sounds: Normal breath sounds.  Abdominal:     General: Abdomen is flat.  Musculoskeletal: Normal range of motion.  Lymphadenopathy:     Cervical: No cervical adenopathy.  Skin:    General: Skin is warm.     Capillary Refill: Capillary refill takes less than 2 seconds.  Neurological:     Comments: Responsive to painful stimuli only      ED Treatments / Results  Labs (all labs ordered are listed, but only abnormal results are displayed) Labs Reviewed  COMPREHENSIVE METABOLIC PANEL - Abnormal; Notable for the following components:      Result Value   Glucose, Bld 119 (*)    BUN 25 (*)    Creatinine, Ser 1.16 (*)    Total Protein 5.9 (*)    Albumin 2.8 (*)    GFR calc non Af Amer 42 (*)    GFR calc Af Amer 49 (*)    All other components within normal limits  CBC WITH DIFFERENTIAL/PLATELET - Abnormal; Notable for the following components:   WBC 10.6 (*)    RBC 3.77 (*)    Hemoglobin 11.4 (*)    Neutro Abs 8.6 (*)    All other components within normal limits  URINALYSIS, ROUTINE W REFLEX MICROSCOPIC - Abnormal; Notable for the following components:   Color, Urine AMBER (*)    APPearance TURBID (*)    Hgb urine dipstick SMALL (*)    Protein, ur 100 (*)    Leukocytes,Ua MODERATE (*)    WBC, UA >50 (*)    Bacteria, UA RARE (*)    Non Squamous Epithelial 0-5 (*)    All other components within normal limits  TROPONIN I - Abnormal; Notable for the following components:   Troponin I 0.04 (*)    All other components within normal limits  SARS CORONAVIRUS 2 (HOSPITAL ORDER, [facility])  CULTURE, BLOOD (ROUTINE X 2)  CULTURE, BLOOD (ROUTINE X 2)  URINE CULTURE  LACTIC ACID, PLASMA  LACTIC ACID, PLASMA    EKG EKG Interpretation  Date/Time:  Wednesday June 04 2018 11:00:38  EDT Ventricular Rate:  78 PR Interval:    QRS Duration: 94 QT Interval:  387 QTC Calculation: 441 R Axis:   -76 Text Interpretation:  Sinus rhythm Left anterior fascicular block Low voltage, extremity leads RSR' in V1 or V2, right VCD or RVH No significant change since last tracing Confirmed by [name] [phone]) on 06/04/2018 12:41:58 PM Also confirmed by [name] [phone]), editor [name] [phone])  on 06/04/2018 1:20:07 PM   Radiology Dg Chest Portable 1 View  Result Date: 06/04/2018 CLINICAL DATA:  Fever with infusion EXAM: PORTABLE CHEST 1 VIEW COMPARISON:  Chest radiograph December 12, 2016 and chest CT May 14, 2017. FINDINGS: There is underlying emphysematous change, better delineated on prior CT. There is no edema or consolidation. There is mild scarring in the right mid lower lung zones. There is no consolidation or edema. Heart size and pulmonary vascularity are normal. No adenopathy. There is aortic atherosclerosis. Bones are osteoporotic. IMPRESSION: Underlying emphysematous change with areas of mild scarring on the right. No frank edema or consolidation. Heart size within normal limits. There is aortic atherosclerosis. Bones osteoporotic. Aortic Atherosclerosis (ICD10-I70.0) and Emphysema (ICD10-J43.9). Electronically Signed   By: Lowella Grip III M.D.   On: 06/04/2018 12:25    Procedures Procedures (including critical  care time)  Medications Ordered in ED Medications  sodium chloride 0.9 % bolus 1,000 mL (0 mLs Intravenous Stopped 06/04/18 1205)    And  sodium chloride 0.9 % bolus 250 mL (250 mLs Intravenous New Bag/Given 06/04/18 1205)  cefTRIAXone (ROCEPHIN) 1 g in sodium chloride 0.9 % 100 mL IVPB (0 g Intravenous Stopped 06/04/18 1157)     Initial Impression / Assessment and Plan / ED Course  I have reviewed the triage vital signs and the nursing notes.  Pertinent labs & imaging results that were available during my care of the patient were reviewed by  me and considered in my medical decision making (see chart for details).    Code sepsis called upon pt's arrival.  With IVFs and improvement in her BP, her MS has improved.  She is awake and alert now.   She has a hx of UTIs, so I started off with Rocephin for UTI.  She is allergic to PCN, but has had Rocephin in the past according to old charts.  Pt has done fine with Rocephin.    Pt had a negative Covid test and UA is +.  Urine cultures and blood cultures sent.  CRITICAL CARE Performed by: [name]   Total critical care time: 30 minutes  Critical care time was exclusive of separately billable procedures and treating other patients.  Critical care was necessary to treat or prevent imminent or life-threatening deterioration.  Critical care was time spent personally by me on the following activities: development of treatment plan with patient and/or surrogate as well as nursing, discussions with consultants, evaluation of patient's response to treatment, examination of patient, obtaining history from patient or surrogate, ordering and performing treatments and interventions, ordering and review of laboratory studies, ordering and review of radiographic studies, pulse oximetry and re-evaluation of patient's condition.    Taylor Roth was evaluated in Emergency Department on 06/04/2018 for the symptoms described in the history of present illness. She was evaluated in the context of the global COVID-19 pandemic, which necessitated consideration that the patient might be at risk for infection with the SARS-CoV-2 virus that causes COVID-19. Institutional protocols and algorithms that pertain to the evaluation of patients at risk for COVID-19 are in a state of rapid change based on information released by regulatory bodies including the CDC and federal and state organizations. These policies and algorithms were followed during the patient's care in the ED.  I did call her daughter [name]) to let her know what was going on.  All questions answered.  Pt d/w Dr. Jonelle Sidle (triad) for admission.  Final Clinical Impressions(s) / ED Diagnoses   Final diagnoses:  Acute cystitis without hematuria  Sepsis, due to unspecified organism, unspecified whether acute organ dysfunction present [facility])  Dehydration  AKI (acute kidney injury) [facility])    ED Discharge Orders    None       [name], MD 06/04/18 1332

## 2018-06-04 NOTE — ED Notes (Signed)
ED TO INPATIENT HANDOFF REPORT  ED Nurse Name and Phone #:  Elmyra Ricks Finley Name/Age/Gender Taylor Roth 85 y.o. female Room/Bed: 023C/023C  Code Status   Code Status: Prior  Home/SNF/Other Skilled nursing facility Patient oriented to: self Is this baseline? Yes   Triage Complete: Triage complete  Chief Complaint Fever; Decreased LOC; Hypotensive  Triage Note Arrived via EMS from [facility] home. AMS and lethargy yesterday today at 0800 facility attempted to feed patient did not eat increased AMS with BP 73/50 and temperature 101.4 F given Tylenol 650 MG at facility. EMS reported BP initially 80/50 gave 1 liter bolus repeat 86/50 CBG 138. Response to pain only. DNR paper at bedside.    Allergies Allergies  Allergen Reactions  . Amoxicillin     Per daughter  . Ampicillin     Per daughter  . Depakote [Valproic Acid]     DOES NOT WANT HER TO TAKE IT==Makes her hair fall out= per daughter who is poa  . Macrobid [Nitrofurantoin Macrocrystal] Rash    Per MAR  . Penicillins     Per daughter  . Azithromycin Other (See Comments)    Per MAR  . Doxycycline Other (See Comments)    Per MAR  . Escitalopram Oxalate Other (See Comments)    Per MAR  . Fioricet [Butalbital-Apap-Caffeine] Other (See Comments)    Drunk. Per MAR  . Latex Other (See Comments)    Per MAR  . Levofloxacin Other (See Comments)    Per MAR  . Nsaids Other (See Comments)    This is not an allergy. The patient was told by Dr. Rockne Menghini to avoid NSAIDs and stop Vicoprofen in to 2014 to avoid nephrotoxicity.  Per MAR.  Marland Kitchen Oxycodone Other (See Comments)    reaction to synthetic codeine Per MAR  . Restasis [Cyclosporine] Other (See Comments)    Per MAR  . Sulfa Antibiotics Other (See Comments)    Per MAR  . Biaxin [Clarithromycin] Rash    With burning sensation Per MAR    Level of Care/Admitting Diagnosis ED Disposition    ED Disposition Condition [facility]: [place] [[id number]]  Level of Care: Progressive [102]  Covid Evaluation: N/A  Diagnosis: Sepsis, Gram negative ([place]) [[id number]]  Admitting Physician: Elwyn Reach [[id number]]  Attending Physician: Elwyn Reach [[id number]]  Estimated length of stay: past midnight tomorrow  Certification:: I certify this patient will need inpatient services for at least 2 midnights  PT Class (Do Not Modify): Inpatient [101]  PT Acc Code (Do Not Modify): Private [1]       B Medical/Surgery History Past Medical History:  Diagnosis Date  . Anxiety   . Arterial tortuosity (aorta) 12/11/2012  . Asthma   . Chronic kidney disease, stage IV (severe) ([place]) 12/11/2012  . Chronic mental illness   . CKD (chronic kidney disease), stage III ([place]) 01/14/2015  . Closed hip fracture requiring operative repair, right, with routine healing, subsequent encounter 12/19/2015  . Colitis   . COPD (chronic obstructive pulmonary disease) ([place])   . Decreased rectal sphincter tone 11/21/2012  . Depression   . Drug-seeking behavior 02/26/2011   Use of multiple md to obtain benzos/narcotics pt must sign contract to receive controlled meds.   Marland Kitchen Dysphagia 01/11/2015  . GERD (gastroesophageal reflux disease)   . Glaucoma 06/26/2011  . Glaucoma of both eyes   . Hearing loss, sensorineural 03/04/2014   [facility]; 02/26/14. There  is not a medical or surgical treatment that would benefit her type of hearing loss. We discussed amplification in an overview fashion.    Marland Kitchen History of fall   . Insomnia, unspecified   . Macular degeneration 06/26/2011  . Migraines   . Nasal bone fracture 03/30/2014  . Osteoporosis   . Osteoporosis 02/26/2011  . Presence of right artificial hip joint   . Protein-calorie malnutrition, severe ([place]) 12/10/2012  . S/P ORIF (open reduction internal fixation) fracture 01/08/2016  . Schatzki's ring 01/14/2015  . Scoliosis   . Substance abuse ([place])    Benbzos and Narcotics, she does not get any of  those medicines from [facility], we have empathetically told her that.  . Vertigo   . Vitamin B deficiency   . Vitamin D deficiency 08/25/2016  . Weight loss    Past Surgical History:  Procedure Laterality Date  . ABDOMINAL HYSTERECTOMY    . CATARACT EXTRACTION    . CHOLECYSTECTOMY    . ESOPHAGOGASTRODUODENOSCOPY (EGD) WITH PROPOFOL N/A 01/13/2015   Procedure: ESOPHAGOGASTRODUODENOSCOPY (EGD) WITH PROPOFOL;  Surgeon: [name], MD;  Location: [facility];  Service: Endoscopy;  Laterality: N/A;  . HIP ARTHROPLASTY Right 12/21/2015   Procedure: ARTHROPLASTY BIPOLAR HIP (HEMIARTHROPLASTY);  Surgeon: [name], MD;  Location: [facility];  Service: Orthopedics;  Laterality: Right;  . PARTIAL HYSTERECTOMY       A IV Location/Drains/Wounds Patient Lines/Drains/Airways Status   Active Line/Drains/Airways    Name:   Placement date:   Placement time:   Site:   Days:   Peripheral IV 06/04/18 Left Forearm   06/04/18    1105    Forearm   less than 1   Peripheral IV 06/04/18 Right Antecubital   06/04/18    1120    Antecubital   less than 1          Intake/Output Last 24 hours  Intake/Output Summary (Last 24 hours) at 06/04/2018 1334 Last data filed at 06/04/2018 1205 Gross per 24 hour  Intake 1100 ml  Output -  Net 1100 ml    Labs/Imaging Results for orders placed or performed during the hospital encounter of 06/04/18 (from the past 48 hour(s))  Lactic acid, plasma     Status: None   Collection Time: 06/04/18 11:21 AM  Result Value Ref Range   Lactic Acid, Venous 1.4 0.5 - 1.9 mmol/L    Comment: Performed at [facility], [address] [place], [place] [address]  Comprehensive metabolic panel     Status: Abnormal   Collection Time: 06/04/18 11:21 AM  Result Value Ref Range   Sodium 144 135 - 145 mmol/L   Potassium 3.8 3.5 - 5.1 mmol/L   Chloride 111 98 - 111 mmol/L   CO2 23 22 - 32 mmol/L   Glucose, Bld 119 (H) 70 - 99 mg/dL   BUN 25 (H) 8 - 23 mg/dL   Creatinine,  Ser 1.16 (H) 0.44 - 1.00 mg/dL   Calcium 8.9 8.9 - 10.3 mg/dL   Total Protein 5.9 (L) 6.5 - 8.1 g/dL   Albumin 2.8 (L) 3.5 - 5.0 g/dL   AST 22 15 - 41 U/L   ALT 16 0 - 44 U/L   Alkaline Phosphatase 66 38 - 126 U/L   Total Bilirubin 0.5 0.3 - 1.2 mg/dL   GFR calc non Af Amer 42 (L) >60 mL/min   GFR calc Af Amer 49 (L) >60 mL/min   Anion gap 10 5 - 15  Comment: Performed at [facility], [place] [address] [place], [place] [address]  CBC WITH DIFFERENTIAL     Status: Abnormal   Collection Time: 06/04/18 11:21 AM  Result Value Ref Range   WBC 10.6 (H) 4.0 - 10.5 K/uL   RBC 3.77 (L) 3.87 - 5.11 MIL/uL   Hemoglobin 11.4 (L) 12.0 - 15.0 g/dL   HCT 36.8 36.0 - 46.0 %   MCV 97.6 80.0 - 100.0 fL   MCH 30.2 26.0 - 34.0 pg   MCHC 31.0 30.0 - 36.0 g/dL   RDW 13.4 11.5 - 15.5 %   Platelets 163 150 - 400 K/uL   nRBC 0.0 0.0 - 0.2 %   Neutrophils Relative % 81 %   Neutro Abs 8.6 (H) 1.7 - 7.7 K/uL   Lymphocytes Relative 9 %   Lymphs Abs 1.0 0.7 - 4.0 K/uL   Monocytes Relative 9 %   Monocytes Absolute 0.9 0.1 - 1.0 K/uL   Eosinophils Relative 0 %   Eosinophils Absolute 0.0 0.0 - 0.5 K/uL   Basophils Relative 0 %   Basophils Absolute 0.0 0.0 - 0.1 K/uL   Immature Granulocytes 1 %   Abs Immature Granulocytes 0.05 0.00 - 0.07 K/uL    Comment: Performed at [facility], [address], [place], [place] [address]  Troponin I - Once     Status: Abnormal   Collection Time: 06/04/18 11:21 AM  Result Value Ref Range   Troponin I 0.04 (HH) <0.03 ng/mL    Comment: CRITICAL RESULT CALLED TO, READ BACK BY AND VERIFIED WITH: Blanchie Serve RN [phone] BY A BENNETT Performed at [facility], [place] [address], [place], [place] [address]   SARS Coronavirus 2 [facility] order, Performed in [facility] hospital lab)     Status: None   Collection Time: 06/04/18 11:25 AM  Result Value Ref Range   SARS Coronavirus 2 NEGATIVE NEGATIVE    Comment: (NOTE) If result is NEGATIVE SARS-CoV-2  target nucleic acids are NOT DETECTED. The SARS-CoV-2 RNA is generally detectable in upper and lower  respiratory specimens during the acute phase of infection. The lowest  concentration of SARS-CoV-2 viral copies this assay can detect is 250  copies / mL. A negative result does not preclude SARS-CoV-2 infection  and should not be used as the sole basis for treatment or other  patient management decisions.  A negative result may occur with  improper specimen collection / handling, submission of specimen other  than nasopharyngeal swab, presence of viral mutation(s) within the  areas targeted by this assay, and inadequate number of viral copies  (<250 copies / mL). A negative result must be combined with clinical  observations, patient history, and epidemiological information. If result is POSITIVE SARS-CoV-2 target nucleic acids are DETECTED. The SARS-CoV-2 RNA is generally detectable in upper and lower  respiratory specimens dur ing the acute phase of infection.  Positive  results are indicative of active infection with SARS-CoV-2.  Clinical  correlation with patient history and other diagnostic information is  necessary to determine patient infection status.  Positive results do  not rule out bacterial infection or co-infection with other viruses. If result is PRESUMPTIVE POSTIVE SARS-CoV-2 nucleic acids MAY BE PRESENT.   A presumptive positive result was obtained on the submitted specimen  and confirmed on repeat testing.  While 2019 novel coronavirus  (SARS-CoV-2) nucleic acids may be present in the submitted sample  additional confirmatory testing may be necessary for  epidemiological  and / or clinical management purposes  to differentiate between  SARS-CoV-2 and other Sarbecovirus currently known to infect humans.  If clinically indicated additional testing with an alternate test  methodology [phone]) is advised. The SARS-CoV-2 RNA is generally  detectable in upper and lower  respiratory sp ecimens during the acute  phase of infection. The expected result is Negative. Fact Sheet for Patients:  StrictlyIdeas.no Fact Sheet for Healthcare Providers: BankingDealers.co.za This test is not yet approved or cleared by the [place] FDA and has been authorized for detection and/or diagnosis of SARS-CoV-2 by FDA under an Emergency Use Authorization (EUA).  This EUA will remain in effect (meaning this test can be used) for the duration of the COVID-19 declaration under Section 564(b)(1) of the Act, 21 U.S.C. section 360bbb-3(b)(1), unless the authorization is terminated or revoked sooner. Performed at [facility], [place] [address] [place], [place] [address]   Urinalysis, Routine w reflex microscopic     Status: Abnormal   Collection Time: 06/04/18 11:42 AM  Result Value Ref Range   Color, Urine AMBER (A) YELLOW    Comment: BIOCHEMICALS MAY BE AFFECTED BY COLOR   APPearance TURBID (A) CLEAR   Specific Gravity, Urine 1.019 1.005 - 1.030   pH 6.0 5.0 - 8.0   Glucose, UA NEGATIVE NEGATIVE mg/dL   Hgb urine dipstick SMALL (A) NEGATIVE   Bilirubin Urine NEGATIVE NEGATIVE   Ketones, ur NEGATIVE NEGATIVE mg/dL   Protein, ur 100 (A) NEGATIVE mg/dL   Nitrite NEGATIVE NEGATIVE   Leukocytes,Ua MODERATE (A) NEGATIVE   RBC / HPF 11-20 0 - 5 RBC/hpf   WBC, UA >50 (H) 0 - 5 WBC/hpf   Bacteria, UA RARE (A) NONE SEEN   Squamous Epithelial / LPF 0-5 0 - 5   WBC Clumps PRESENT    Mucus PRESENT    Hyaline Casts, UA PRESENT    Non Squamous Epithelial 0-5 (A) NONE SEEN    Comment: Performed at [facility], [place] [address] [place],  [address]   Dg Chest Portable 1 View  Result Date: 06/04/2018 CLINICAL DATA:  Fever with infusion EXAM: PORTABLE CHEST 1 VIEW COMPARISON:  Chest radiograph December 12, 2016 and chest CT May 14, 2017. FINDINGS: There is underlying emphysematous change, better delineated on prior  CT. There is no edema or consolidation. There is mild scarring in the right mid lower lung zones. There is no consolidation or edema. Heart size and pulmonary vascularity are normal. No adenopathy. There is aortic atherosclerosis. Bones are osteoporotic. IMPRESSION: Underlying emphysematous change with areas of mild scarring on the right. No frank edema or consolidation. Heart size within normal limits. There is aortic atherosclerosis. Bones osteoporotic. Aortic Atherosclerosis (ICD10-I70.0) and Emphysema (ICD10-J43.9). Electronically Signed   By: Lowella Grip III M.D.   On: 06/04/2018 12:25    Pending Labs Unresulted Labs (From admission, onward)    Start     Ordered   06/04/18 1103  Lactic acid, plasma  Now then every 2 hours,   STAT     06/04/18 1103   06/04/18 1103  Blood Culture (routine x 2)  BLOOD CULTURE X 2,   STAT    Question:  Patient immune status  Answer:  Normal   06/04/18 1103   06/04/18 1103  Urine culture  ONCE - STAT,   STAT    Question:  Patient immune status  Answer:  Normal   06/04/18 1103   Signed and Held  CBC  (  heparin)  Once,   R    Comments:  Baseline for heparin therapy IF NOT ALREADY DRAWN.  Notify MD if PLT < 100 K.    Signed and Held   Signed and Held  Creatinine, serum  (heparin)  Once,   R    Comments:  Baseline for heparin therapy IF NOT ALREADY DRAWN.    Signed and Held   Signed and Held  Comprehensive metabolic panel  Tomorrow morning,   R     Signed and Held   Signed and Held  CBC  Tomorrow morning,   R     Signed and Held   Signed and Held  Urine culture  Once,   R     Signed and Held          Vitals/Pain Today's Vitals   06/04/18 1200 06/04/18 1215 06/04/18 1301 06/04/18 1332  BP: (!) 80/53 (!) 93/59 108/61 (!) 94/53  Pulse: 85 81 80 78  Resp: 19 (!) 24 19 16   Temp:      TempSrc:      SpO2: 99% 99% 97% 97%  Weight:      Height:        Isolation Precautions Droplet and Contact precautions  Medications Medications  sodium  chloride 0.9 % bolus 1,000 mL (0 mLs Intravenous Stopped 06/04/18 1205)    And  sodium chloride 0.9 % bolus 250 mL (250 mLs Intravenous New Bag/Given 06/04/18 1205)  cefTRIAXone (ROCEPHIN) 1 g in sodium chloride 0.9 % 100 mL IVPB (0 g Intravenous Stopped 06/04/18 1157)    Mobility non-ambulatory High fall risk   Focused Assessments Neuro Assessment Handoff:  Swallow screen pass? N/A Cardiac Rhythm: Normal sinus rhythm       Neuro Assessment:   Neuro Checks:      Last Documented NIHSS Modified Score:   Has TPA been given? No If patient is a Neuro Trauma and patient is going to OR before floor call report to [place] nurse: [phone] or [phone]     R Recommendations: See Admitting Provider Note  Report given to:   Additional Notes:

## 2018-06-04 NOTE — ED Triage Notes (Signed)
Arrived via EMS from [facility]. AMS and lethargy yesterday today at 0800 facility attempted to feed patient did not eat increased AMS with BP 73/50 and temperature 101.4 F given Tylenol 650 MG at facility. EMS reported BP initially 80/50 gave 1 liter bolus repeat 86/50 CBG 138. Response to pain only. DNR paper at bedside.

## 2018-06-04 NOTE — Progress Notes (Signed)
Pt orientation to unit, room and routine. Information packet given to patient/family and safety video watched.  Admission INP armband ID verified with patient/family, and in place. SR up x 2, fall risk assessment complete with Patient verbalizing understanding of risks associated with falls. Pt verbalizes an understanding of how to use the call bell and to call for help before getting out of bed.  Skin, warm and dry to touch, stage 2 sacrum pressure wound noted and stage one on left hip.  Pt placed on the progressive monitor, no complaints of pain or discomfort at this time.  Will cont to monitor and assist as needed.  Hosie Spangle, RN 06/04/2018 5:45 PM

## 2018-06-04 NOTE — H&P (Signed)
History and Physical   Taylor Roth KAJ:[id number] DOB: 1928-07-14 DOA: 06/04/2018  Referring MD/NP/PA: Dr. Gilford Raid  PCP: [name], MD   Outpatient Specialists: None  Patient coming from: Skilled nursing facility  Chief Complaint: Altered mental status and fever  HPI: Taylor Roth is a 85 y.o. female with medical history significant of dementia, chronic kidney disease stage III, COPD, anxiety disorder, depression, protein calorie malnutrition, history of decubitus ulcers of the hip who was brought in from skilled nursing facility with altered mental status, fever and hypotension.  Patient's blood pressure on arrival was 73/50 and a temperature 101.4.  She received Tylenol and IV fluid boluses.  Patient was initially suspected of COVID-19 and the rapid test was done which was negative.  Further evaluation showed UTI with SIRS symptoms.  Patient is therefore being admitted with sepsis secondary to UTI.  Her blood pressure has responded to IV fluids systolic is are currently at 1 or 1.  Patient is a DNR.  She will be admitted to progressive care for treatment of sepsis.  She is currently confused altered responding only to sternal rub.  Not responding to my questions or commands..  ED Course: Initial temperature 101, blood pressure 76/46 with heart rate of 102 respirate of 27 oxygen sat 96% room air.  Her white count is 10.6 hemoglobin 11.4.  Platelets 163.  Chemistry appears to be within normal except for glucose 119 BUN 25 and creatinine of 1.16.  Troponin 0 0.04 lactic acid 1.4.  Chest x-ray showed no active disease.  Urinalysis showed turbid urine with WBC more than 50 RBC 11-20 and many bacteria.  COVID-19 testing is negative rapid test.  Patient being admitted with urosepsis  Review of Systems: As per HPI otherwise 10 point review of systems negative.    Past Medical History:  Diagnosis Date   Anxiety    Arterial tortuosity (aorta) 12/11/2012   Asthma    Chronic  kidney disease, stage IV (severe) ([place]) 12/11/2012   Chronic mental illness    CKD (chronic kidney disease), stage III ([place]) 01/14/2015   Closed hip fracture requiring operative repair, right, with routine healing, subsequent encounter 12/19/2015   Colitis    COPD (chronic obstructive pulmonary disease) (HCC)    Decreased rectal sphincter tone 11/21/2012   Depression    Drug-seeking behavior 02/26/2011   Use of multiple md to obtain benzos/narcotics pt must sign contract to receive controlled meds.    Dysphagia 01/11/2015   GERD (gastroesophageal reflux disease)    Glaucoma 06/26/2011   Glaucoma of both eyes    Hearing loss, sensorineural 03/04/2014   [facility] ENT; 02/26/14. There is not a medical or surgical treatment that would benefit her type of hearing loss. We discussed amplification in an overview fashion.     History of fall    Insomnia, unspecified    Macular degeneration 06/26/2011   Migraines    Nasal bone fracture 03/30/2014   Osteoporosis    Osteoporosis 02/26/2011   Presence of right artificial hip joint    Protein-calorie malnutrition, severe ([place]) 12/10/2012   S/P ORIF (open reduction internal fixation) fracture 01/08/2016   Schatzki's ring 01/14/2015   Scoliosis    Substance abuse ([place])    Benbzos and Narcotics, she does not get any of those medicines from [facility], we have empathetically told her that.   Vertigo    Vitamin B deficiency    Vitamin D deficiency 08/25/2016   Weight loss     Past Surgical  History:  Procedure Laterality Date   ABDOMINAL HYSTERECTOMY     CATARACT EXTRACTION     CHOLECYSTECTOMY     ESOPHAGOGASTRODUODENOSCOPY (EGD) WITH PROPOFOL N/A 01/13/2015   Procedure: ESOPHAGOGASTRODUODENOSCOPY (EGD) WITH PROPOFOL;  Surgeon: [name], MD;  Location: [facility];  Service: Endoscopy;  Laterality: N/A;   HIP ARTHROPLASTY Right 12/21/2015   Procedure: ARTHROPLASTY BIPOLAR HIP (HEMIARTHROPLASTY);  Surgeon: [name], MD;  Location: [facility];  Service: Orthopedics;  Laterality: Right;   PARTIAL HYSTERECTOMY       reports that she has quit smoking. Her smoking use included cigarettes. She smoked 0.50 packs per day. She has never used smokeless tobacco. She reports that she does not drink alcohol or use drugs.  Allergies  Allergen Reactions   Amoxicillin     Per daughter   Ampicillin     Per daughter   Depakote [Valproic Acid]     DOES NOT WANT HER TO TAKE IT==Makes her hair fall out= per daughter who is poa   Macrobid [Nitrofurantoin Macrocrystal] Rash    Per MAR   Penicillins     Per daughter   Azithromycin Other (See Comments)    Per MAR   Doxycycline Other (See Comments)    Per MAR   Escitalopram Oxalate Other (See Comments)    Per MAR   Fioricet [Butalbital-Apap-Caffeine] Other (See Comments)    Drunk. Per MAR   Latex Other (See Comments)    Per MAR   Levofloxacin Other (See Comments)    Per MAR   Nsaids Other (See Comments)    This is not an allergy. The patient was told by Dr. Rockne Menghini to avoid NSAIDs and stop Vicoprofen in to 2014 to avoid nephrotoxicity.  Per MAR.   Oxycodone Other (See Comments)    reaction to synthetic codeine Per MAR   Restasis [Cyclosporine] Other (See Comments)    Per MAR   Sulfa Antibiotics Other (See Comments)    Per MAR   Biaxin [Clarithromycin] Rash    With burning sensation Per MAR    Family History  Problem Relation Age of Onset   Stroke Mother    Diabetes Mother    Heart disease Mother    Hearing loss Mother    Cancer Father 21       esophageal     Prior to Admission medications   Medication Sig Start Date End Date Taking? Authorizing Provider  acetaminophen (TYLENOL) 325 MG tablet Take 650 mg by mouth every 6 (six) hours as needed for headache.    [provider]  albuterol (PROVENTIL HFA;VENTOLIN HFA) 108 (90 BASE) MCG/ACT inhaler Inhale 1 puff into the lungs every 6 (six) hours as needed for  shortness of breath.     [provider]  Biotin 5000 MCG CAPS Take 1 capsule by mouth daily.     [provider]  bisacodyl (DULCOLAX) 10 MG suppository Place 10 mg rectally as needed for moderate constipation.    [provider]  Cholecalciferol (VITAMIN D) 1000 UNITS capsule Take 1,000 Units by mouth daily with breakfast.     [provider]  clonazePAM (KLONOPIN) 0.5 MG tablet Take 0.5 tablets (0.25 mg total) by mouth 3 (three) times daily. 05/07/18   Gerlene Fee, NP  Cranberry 450 MG TABS Take 1 tablet by mouth 2 (two) times daily.    [provider]  docusate sodium (COLACE) 100 MG capsule Take 100 mg by mouth 2 (two) times daily.  [provider]  dorzolamide-timolol (COSOPT) 22.3-6.8 MG/ML ophthalmic solution Place 1 drop into both eyes 2 (two) times daily.    [provider]  ENSURE (ENSURE) Take 237 mLs by mouth 4 (four) times daily. D/t weight loss    [provider]  ferrous sulfate 325 (65 FE) MG tablet Take 325 mg by mouth daily.     [provider]  fludrocortisone (FLORINEF) 0.1 MG tablet Take 0.1 mg by mouth every morning. for orthostatic hypotension    [provider]  HYDROcodone-acetaminophen (NORCO/VICODIN) 5-325 MG tablet Take 1 tablet by mouth at bedtime. At 9 am , 1 pm , and 9 pm for pain 05/14/18   Gerlene Fee, NP  lactulose (CHRONULAC) 10 GM/15ML solution Take 15 mLs (10 g total) by mouth daily as needed for severe constipation. 08/12/15   [name], MD  loratadine (CLARITIN) 10 MG tablet Take 10 mg by mouth daily.    [provider]  magnesium hydroxide (MILK OF MAGNESIA) 400 MG/5ML suspension Take 30 mLs by mouth daily as needed for mild constipation.    [provider]  mirtazapine (REMERON) 15 MG tablet Take 7.5 mg by mouth at bedtime.    [provider]  nitroGLYCERIN (NITROSTAT) 0.4 MG SL tablet Place 0.4 mg under the tongue every 5 (five)  minutes as needed for chest pain. May use 3 times    [provider]  Polyethyl Glycol-Propyl Glycol (SYSTANE) 0.4-0.3 % SOLN Place 2 drops into both eyes every 2 (two) hours as needed.     [provider]  polyethylene glycol (MIRALAX / GLYCOLAX) packet Take 17 g by mouth daily.     [provider]  QUEtiapine (SEROQUEL) 100 MG tablet Take 100 mg by mouth at bedtime.    [provider]  QUEtiapine (SEROQUEL) 300 MG tablet Take 300 mg by mouth at bedtime. Give with a 50 mg tablet to = 350 mg    [provider]  senna (SENOKOT) 8.6 MG tablet Take 2 tablets by mouth 2 (two) times daily as needed for constipation.    [provider]  travoprost, benzalkonium, (TRAVATAN) 0.004 % ophthalmic solution Place 1 drop into both eyes at bedtime.    [provider]  vitamin B-12 (CYANOCOBALAMIN) 1000 MCG tablet Take 1,000 mcg by mouth daily.    [provider]    Physical Exam: Vitals:   06/04/18 1145 06/04/18 1200 06/04/18 1215 06/04/18 1301  BP: (!) 102/57 (!) 80/53 (!) 93/59 108/61  Pulse: 95 85 81 80  Resp: 20 19 (!) 24 19  Temp:      TempSrc:      SpO2: 100% 99% 99% 97%  Weight:      Height:          Constitutional: Obtunded, responding to sternal rub, chronically ill looking Vitals:   06/04/18 1145 06/04/18 1200 06/04/18 1215 06/04/18 1301  BP: (!) 102/57 (!) 80/53 (!) 93/59 108/61  Pulse: 95 85 81 80  Resp: 20 19 (!) 24 19  Temp:      TempSrc:      SpO2: 100% 99% 99% 97%  Weight:      Height:       Eyes: PERRL, lids and conjunctivae normal, sunken eyes ENMT: Mucous membranes are dry. Posterior pharynx clear of any exudate or lesions.Normal dentition.  Neck: normal, supple, no masses, no thyromegaly Respiratory: clear to auscultation bilaterally, no wheezing, no crackles. Normal respiratory effort. No accessory muscle use.  Cardiovascular:  Tachycardia, no murmurs / rubs / gallops. No extremity edema. 2+ pedal  pulses. No carotid bruits.  Abdomen: no tenderness, no masses palpated. No hepatosplenomegaly. Bowel sounds positive.  Musculoskeletal: no clubbing / cyanosis. No joint deformity upper and lower extremities. Good ROM, no contractures. Normal muscle tone.  Skin: no rashes, lesions, ulcers. No induration Neurologic: CN 2-12 grossly intact. Sensation intact, DTR normal. Strength 5/5 in all 4.  Psychiatric: Completely obtunded    Labs on Admission: I have personally reviewed following labs and imaging studies  CBC: Recent Labs  Lab 06/04/18 1121  WBC 10.6*  NEUTROABS 8.6*  HGB 11.4*  HCT 36.8  MCV 97.6  PLT 017   Basic Metabolic Panel: Recent Labs  Lab 06/04/18 1121  NA 144  K 3.8  CL 111  CO2 23  GLUCOSE 119*  BUN 25*  CREATININE 1.16*  CALCIUM 8.9   GFR: Estimated Creatinine Clearance: 20 mL/min (A) (by C-G formula based on SCr of 1.16 mg/dL (H)). Liver Function Tests: Recent Labs  Lab 06/04/18 1121  AST 22  ALT 16  ALKPHOS 66  BILITOT 0.5  PROT 5.9*  ALBUMIN 2.8*   No results for input(s): LIPASE, AMYLASE in the last 168 hours. No results for input(s): AMMONIA in the last 168 hours. Coagulation Profile: No results for input(s): INR, PROTIME in the last 168 hours. Cardiac Enzymes: Recent Labs  Lab 06/04/18 1121  TROPONINI 0.04*   BNP (last 3 results) No results for input(s): PROBNP in the last 8760 hours. HbA1C: No results for input(s): HGBA1C in the last 72 hours. CBG: No results for input(s): GLUCAP in the last 168 hours. Lipid Profile: No results for input(s): CHOL, HDL, LDLCALC, TRIG, CHOLHDL, LDLDIRECT in the last 72 hours. Thyroid Function Tests: No results for input(s): TSH, T4TOTAL, FREET4, T3FREE, THYROIDAB in the last 72 hours. Anemia Panel: No results for input(s): VITAMINB12, FOLATE, FERRITIN, TIBC, IRON, RETICCTPCT in the last 72 hours. Urine analysis:    Component Value Date/Time   COLORURINE AMBER (A) 06/04/2018 1142    APPEARANCEUR TURBID (A) 06/04/2018 1142   LABSPEC 1.019 06/04/2018 1142   PHURINE 6.0 06/04/2018 1142   GLUCOSEU NEGATIVE 06/04/2018 1142   HGBUR SMALL (A) 06/04/2018 1142   BILIRUBINUR NEGATIVE 06/04/2018 1142   BILIRUBINUR neg 06/20/2013 1736   KETONESUR NEGATIVE 06/04/2018 1142   PROTEINUR 100 (A) 06/04/2018 1142   UROBILINOGEN 0.2 12/25/2014 1620   NITRITE NEGATIVE 06/04/2018 1142   LEUKOCYTESUR MODERATE (A) 06/04/2018 1142   Sepsis Labs: @LABRCNTIP (procalcitonin:4,lacticidven:4) ) Recent Results (from the past 240 hour(s))  SARS Coronavirus 2 [facility] order, Performed in [place] hospital lab)     Status: None   Collection Time: 06/04/18 11:25 AM  Result Value Ref Range Status   SARS Coronavirus 2 NEGATIVE NEGATIVE Final    Comment: (NOTE) If result is NEGATIVE SARS-CoV-2 target nucleic acids are NOT DETECTED. The SARS-CoV-2 RNA is generally detectable in upper and lower  respiratory specimens during the acute phase of infection. The lowest  concentration of SARS-CoV-2 viral copies this assay can detect is 250  copies / mL. A negative result does not preclude SARS-CoV-2 infection  and should not be used as the sole basis for treatment or other  patient management decisions.  A negative result may occur with  improper specimen collection / handling, submission of specimen other  than nasopharyngeal swab, presence of viral mutation(s) within the  areas targeted by this assay, and inadequate number of viral copies  (<250 copies / mL).  A negative result must be combined with clinical  observations, patient history, and epidemiological information. If result is POSITIVE SARS-CoV-2 target nucleic acids are DETECTED. The SARS-CoV-2 RNA is generally detectable in upper and lower  respiratory specimens dur ing the acute phase of infection.  Positive  results are indicative of active infection with SARS-CoV-2.  Clinical  correlation with patient history and other diagnostic  information is  necessary to determine patient infection status.  Positive results do  not rule out bacterial infection or co-infection with other viruses. If result is PRESUMPTIVE POSTIVE SARS-CoV-2 nucleic acids MAY BE PRESENT.   A presumptive positive result was obtained on the submitted specimen  and confirmed on repeat testing.  While 2019 novel coronavirus  (SARS-CoV-2) nucleic acids may be present in the submitted sample  additional confirmatory testing may be necessary for epidemiological  and / or clinical management purposes  to differentiate between  SARS-CoV-2 and other Sarbecovirus currently known to infect humans.  If clinically indicated additional testing with an alternate test  methodology [phone]) is advised. The SARS-CoV-2 RNA is generally  detectable in upper and lower respiratory sp ecimens during the acute  phase of infection. The expected result is Negative. Fact Sheet for Patients:  StrictlyIdeas.no Fact Sheet for Healthcare Providers: BankingDealers.co.za This test is not yet approved or cleared by the [place] FDA and has been authorized for detection and/or diagnosis of SARS-CoV-2 by FDA under an Emergency Use Authorization (EUA).  This EUA will remain in effect (meaning this test can be used) for the duration of the COVID-19 declaration under Section 564(b)(1) of the Act, 21 U.S.C. section 360bbb-3(b)(1), unless the authorization is terminated or revoked sooner. Performed at [facility], [place] [address], [place], [place] [address]      Radiological Exams on Admission: Dg Chest Portable 1 View  Result Date: 06/04/2018 CLINICAL DATA:  Fever with infusion EXAM: PORTABLE CHEST 1 VIEW COMPARISON:  Chest radiograph December 12, 2016 and chest CT May 14, 2017. FINDINGS: There is underlying emphysematous change, better delineated on prior CT. There is no edema or consolidation. There is mild scarring in  the right mid lower lung zones. There is no consolidation or edema. Heart size and pulmonary vascularity are normal. No adenopathy. There is aortic atherosclerosis. Bones are osteoporotic. IMPRESSION: Underlying emphysematous change with areas of mild scarring on the right. No frank edema or consolidation. Heart size within normal limits. There is aortic atherosclerosis. Bones osteoporotic. Aortic Atherosclerosis (ICD10-I70.0) and Emphysema (ICD10-J43.9). Electronically Signed   By: Lowella Grip III M.D.   On: 06/04/2018 12:25    EKG: Independently reviewed.  It shows normal sinus rhythm with a rate of 78, left anterior fascicular block.  No significant ST changes  Assessment/Plan Principal Problem:   Sepsis, Gram negative (HCC) Active Problems:   COPD (chronic obstructive pulmonary disease) (HCC)   GERD (gastroesophageal reflux disease)   Lower urinary tract infectious disease   CKD (chronic kidney disease), stage III (HCC)   Altered mental status   HTN (hypertension)   Dementia with behavioral problem ([place])     #1 sepsis: Secondary to urinary tract infection.  Patient will be admitted to progressive care unit.  She is currently getting fluid boluses and blood pressure has responded.  No need for pressors.  Treat underlying UTI.  #2 UTI: Most likely due to gram-negative coliform bacteria.  Patient has tolerated IV Rocephin in the ER despite penicillin allergies.  We will continue with Rocephin.  Await urine culture  sensitivity results.  Also blood cultures  #3 GERD: Continue with PPIs  #4 COPD: No acute exacerbation.  #5 metabolic encephalopathy: Secondary to sepsis.  Treat and monitor  #6 hypertension: Blood pressure currently low.  Rebounding with fluids.  Hold antihypertensives until blood pressure stabilizes.  #7 dementia: Patient has advanced dementia but at least able to communicate at baseline.  Continue close monitoring  #8 chronic kidney disease stage III: Renal  function appears to be at baseline.  Continue monitoring.    DVT prophylaxis: Heparin Code Status: DNR Family Communication: No family at bedside Disposition Plan: Back to skilled facility Consults called: None Admission status: Inpatient to progressive care  Severity of Illness: The appropriate patient status for this patient is INPATIENT. Inpatient status is judged to be reasonable and necessary in order to provide the required intensity of service to ensure the patient's safety. The patient's presenting symptoms, physical exam findings, and initial radiographic and laboratory data in the context of their chronic comorbidities is felt to place them at high risk for further clinical deterioration. Furthermore, it is not anticipated that the patient will be medically stable for discharge from the hospital within 2 midnights of admission. The following factors support the patient status of inpatient.   " The patient's presenting symptoms include hypotension and altered mental status. " The worrisome physical exam findings include cachectic chronically ill looking female. " The initial radiographic and laboratory data are worrisome because of evidence of UTI. " The chronic co-morbidities include dementia.   * I certify that at the point of admission it is my clinical judgment that the patient will require inpatient hospital care spanning beyond 2 midnights from the point of admission due to high intensity of service, high risk for further deterioration and high frequency of surveillance required.Barbette Merino MD [name] Pager [phone]  If 7PM-7AM, please contact night-coverage [email or web address] Password [email or web address]  06/04/2018, 1:33 PM

## 2018-06-05 ENCOUNTER — Inpatient Hospital Stay (HOSPITAL_COMMUNITY): Payer: Medicare Other

## 2018-06-05 DIAGNOSIS — E46 Unspecified protein-calorie malnutrition: Secondary | ICD-10-CM

## 2018-06-05 DIAGNOSIS — Z886 Allergy status to analgesic agent status: Secondary | ICD-10-CM

## 2018-06-05 DIAGNOSIS — L89221 Pressure ulcer of left hip, stage 1: Secondary | ICD-10-CM

## 2018-06-05 DIAGNOSIS — Z881 Allergy status to other antibiotic agents status: Secondary | ICD-10-CM

## 2018-06-05 DIAGNOSIS — B9562 Methicillin resistant Staphylococcus aureus infection as the cause of diseases classified elsewhere: Secondary | ICD-10-CM

## 2018-06-05 DIAGNOSIS — R4182 Altered mental status, unspecified: Secondary | ICD-10-CM

## 2018-06-05 DIAGNOSIS — Z66 Do not resuscitate: Secondary | ICD-10-CM

## 2018-06-05 DIAGNOSIS — Z91040 Latex allergy status: Secondary | ICD-10-CM

## 2018-06-05 DIAGNOSIS — I959 Hypotension, unspecified: Secondary | ICD-10-CM

## 2018-06-05 DIAGNOSIS — Z880 Allergy status to penicillin: Secondary | ICD-10-CM

## 2018-06-05 DIAGNOSIS — Z885 Allergy status to narcotic agent status: Secondary | ICD-10-CM

## 2018-06-05 DIAGNOSIS — J449 Chronic obstructive pulmonary disease, unspecified: Secondary | ICD-10-CM

## 2018-06-05 DIAGNOSIS — F0391 Unspecified dementia with behavioral disturbance: Secondary | ICD-10-CM

## 2018-06-05 DIAGNOSIS — Z87891 Personal history of nicotine dependence: Secondary | ICD-10-CM

## 2018-06-05 DIAGNOSIS — L8990 Pressure ulcer of unspecified site, unspecified stage: Secondary | ICD-10-CM

## 2018-06-05 DIAGNOSIS — L89152 Pressure ulcer of sacral region, stage 2: Secondary | ICD-10-CM

## 2018-06-05 DIAGNOSIS — R7881 Bacteremia: Secondary | ICD-10-CM

## 2018-06-05 DIAGNOSIS — N184 Chronic kidney disease, stage 4 (severe): Secondary | ICD-10-CM

## 2018-06-05 DIAGNOSIS — Z888 Allergy status to other drugs, medicaments and biological substances status: Secondary | ICD-10-CM

## 2018-06-05 LAB — CBC
HCT: 33.8 % — ABNORMAL LOW (ref 36.0–46.0)
Hemoglobin: 10.8 g/dL — ABNORMAL LOW (ref 12.0–15.0)
MCH: 30.5 pg (ref 26.0–34.0)
MCHC: 32 g/dL (ref 30.0–36.0)
MCV: 95.5 fL (ref 80.0–100.0)
Platelets: 140 10*3/uL — ABNORMAL LOW (ref 150–400)
RBC: 3.54 MIL/uL — ABNORMAL LOW (ref 3.87–5.11)
RDW: 13.4 % (ref 11.5–15.5)
WBC: 10.3 10*3/uL (ref 4.0–10.5)
nRBC: 0 % (ref 0.0–0.2)

## 2018-06-05 LAB — BLOOD CULTURE ID PANEL (REFLEXED)

## 2018-06-05 LAB — COMPREHENSIVE METABOLIC PANEL
ALT: 16 U/L (ref 0–44)
AST: 22 U/L (ref 15–41)
Albumin: 2.5 g/dL — ABNORMAL LOW (ref 3.5–5.0)
Alkaline Phosphatase: 59 U/L (ref 38–126)
Anion gap: 8 (ref 5–15)
BUN: 15 mg/dL (ref 8–23)
CO2: 21 mmol/L — ABNORMAL LOW (ref 22–32)
Calcium: 8.2 mg/dL — ABNORMAL LOW (ref 8.9–10.3)
Chloride: 116 mmol/L — ABNORMAL HIGH (ref 98–111)
Creatinine, Ser: 0.82 mg/dL (ref 0.44–1.00)
GFR calc Af Amer: >60 mL/min (ref >60)
GFR calc non Af Amer: >60 mL/min (ref >60)
Glucose, Bld: 105 mg/dL — ABNORMAL HIGH (ref 70–99)
Potassium: 2.9 mmol/L — ABNORMAL LOW (ref 3.5–5.1)
Sodium: 145 mmol/L (ref 135–145)
Total Bilirubin: 0.5 mg/dL (ref 0.3–1.2)
Total Protein: 5.7 g/dL — ABNORMAL LOW (ref 6.5–8.1)

## 2018-06-05 MED ORDER — VANCOMYCIN HCL IN DEXTROSE 750-5 MG/150ML-% IV SOLN
750.0000 mg | INTRAVENOUS | Status: DC
  Administered 2018-06-07: 750 mg via INTRAVENOUS
  Filled 2018-06-05: qty 150

## 2018-06-05 MED ORDER — CLONAZEPAM 0.25 MG PO TBDP
0.2500 mg | ORAL_TABLET | Freq: Every day | ORAL | Status: DC
  Administered 2018-06-05: 0.25 mg via ORAL
  Filled 2018-06-05: qty 1

## 2018-06-05 MED ORDER — VANCOMYCIN HCL 1000 MG IV SOLR
1000.0000 mg | Freq: Once | INTRAVENOUS | Status: AC
  Administered 2018-06-05: 1000 mg via INTRAVENOUS
  Filled 2018-06-05: qty 1000

## 2018-06-05 MED ORDER — ACETAMINOPHEN 500 MG PO TABS
1000.0000 mg | ORAL_TABLET | Freq: Every day | ORAL | Status: DC
  Administered 2018-06-05 – 2018-06-07 (×3): 1000 mg via ORAL
  Filled 2018-06-05 (×3): qty 2

## 2018-06-05 NOTE — Progress Notes (Signed)
Patient was combative and refused exam

## 2018-06-05 NOTE — Progress Notes (Addendum)
Pharmacy Antibiotic Note  Taylor Roth is a 85 y.o. female admitted from SNF on 06/04/2018 with AMS, fever, and hypotension. Currently receiving treatment for sepsis from suspected UTI. 1/4 admit BCx now growing GPC, BCID MRSA. Pharmacy has been consulted for vancomycin dosing. Scr 0.83, estimated CrCl ~30 mL/min.  Plan: Vancomycin 1000mg  IVx1, followed by 750mg  IV q48h (estimated AUC 444, goal 400-550) F/u clinical status, C&S, renal function, LOT, vancomycin levels as appropriate  Height: 4\' 11"  (149.9 cm) Weight: 87 lb 8.4 oz (39.7 kg) IBW/kg (Calculated) : 43.2  Temp (24hrs), Avg:99.2 F (37.3 C), Min:97.7 F (36.5 C), Max:100.5 F (38.1 C)  Recent Labs  Lab 06/04/18 1121 06/04/18 1432 06/05/18 0320  WBC 10.6*  --  10.3  CREATININE 1.16*  --  0.82  LATICACIDVEN 1.4 1.9  --     Estimated Creatinine Clearance: 30.3 mL/min (by C-G formula based on SCr of 0.82 mg/dL).    Allergies  Allergen Reactions  . Amoxicillin     Per daughter  . Ampicillin     Per daughter  . Depakote [Valproic Acid]     DOES NOT WANT HER TO TAKE IT==Makes her hair fall out= per daughter who is poa  . Macrobid [Nitrofurantoin Macrocrystal] Rash    Per MAR  . Penicillins     Per daughter  . Azithromycin Other (See Comments)    Per MAR  . Doxycycline Other (See Comments)    Per MAR  . Escitalopram Oxalate Other (See Comments)    Per MAR  . Fioricet [Butalbital-Apap-Caffeine] Other (See Comments)    Drunk. Per MAR  . Latex Other (See Comments)    Per MAR  . Levofloxacin Other (See Comments)    Per MAR  . Nsaids Other (See Comments)    This is not an allergy. The patient was told by Dr. Rockne Menghini to avoid NSAIDs and stop Vicoprofen in to 2014 to avoid nephrotoxicity.  Per MAR.  Marland Kitchen Oxycodone Other (See Comments)    reaction to synthetic codeine Per MAR  . Restasis [Cyclosporine] Other (See Comments)    Per MAR  . Sulfa Antibiotics Other (See Comments)    Per MAR  . Biaxin [Clarithromycin]  Rash    With burning sensation Per MAR    Antimicrobials this admission: Ceftriaxone 4/23 Vancomycin 4/23 >>  Microbiology results: 4/22 UCx: 80k Proteus 4/22 BCx: 1/4 GPC (BCID MRSA) 4/22 COVID: neg  Thank you for allowing pharmacy to be a part of this patient's care.  Mila Merry Gerarda Fraction, PharmD, [place] PGY2 Infectious Diseases Pharmacy Resident Phone: [phone] 06/05/2018 9:37 AM

## 2018-06-05 NOTE — Progress Notes (Signed)
PHARMACY - PHYSICIAN COMMUNICATION CRITICAL VALUE ALERT - BLOOD CULTURE IDENTIFICATION (BCID)  Taylor Roth is an 85 y.o. female who presented to [facility] from SNF with a chief complaint of AMS, fever, and hypotension. Currently receiving treatment for sepsis from suspected UTI. 1/4 admit BCx now growing GPC, BCID MRSA.  Name of physician (or Provider) Contacted: [place]  Current antibiotics: Ceftriaxone  Changes to prescribed antibiotics recommended:  Discontinue ceftriaxone. Start vancomycin. Recommendations accepted by provider.  Results for orders placed or performed during the hospital encounter of 06/04/18  Blood Culture ID Panel (Reflexed) (Collected: 06/04/2018 11:22 AM)  Result Value Ref Range   Enterococcus species NOT DETECTED NOT DETECTED   Listeria monocytogenes NOT DETECTED NOT DETECTED   Staphylococcus species DETECTED (A) NOT DETECTED   Staphylococcus aureus (BCID) DETECTED (A) NOT DETECTED   Methicillin resistance DETECTED (A) NOT DETECTED   Streptococcus species NOT DETECTED NOT DETECTED   Streptococcus agalactiae NOT DETECTED NOT DETECTED   Streptococcus pneumoniae NOT DETECTED NOT DETECTED   Streptococcus pyogenes NOT DETECTED NOT DETECTED   Acinetobacter baumannii NOT DETECTED NOT DETECTED   Enterobacteriaceae species NOT DETECTED NOT DETECTED   Enterobacter cloacae complex NOT DETECTED NOT DETECTED   Escherichia coli NOT DETECTED NOT DETECTED   Klebsiella oxytoca NOT DETECTED NOT DETECTED   Klebsiella pneumoniae NOT DETECTED NOT DETECTED   Proteus species NOT DETECTED NOT DETECTED   Serratia marcescens NOT DETECTED NOT DETECTED   Haemophilus influenzae NOT DETECTED NOT DETECTED   Neisseria meningitidis NOT DETECTED NOT DETECTED   Pseudomonas aeruginosa NOT DETECTED NOT DETECTED   Candida albicans NOT DETECTED NOT DETECTED   Candida glabrata NOT DETECTED NOT DETECTED   Candida krusei NOT DETECTED NOT DETECTED   Candida parapsilosis NOT  DETECTED NOT DETECTED   Candida tropicalis NOT DETECTED NOT DETECTED   [name] N. Gerarda Fraction, PharmD, [place] PGY2 Infectious Diseases Pharmacy Resident Phone: [phone] 06/05/2018  9:19 AM

## 2018-06-05 NOTE — Consult Note (Addendum)
[facility] Infectious Disease    Date of Admission:  06/04/2018     Total days of antibiotics 2  Day 1 vancomycin               Reason for Consult: MRSA Bacteremia     Referring Provider: CHAMP autoconsult  Primary Care Provider: [name], MD    Assessment: Taylor Roth is a 85 y.o. female with dementia admitted from SNF with lethargy, fever and hypotension. Blood cultures are positive for MRSA 1/4 bottles. She has a few wounds one of which is partial thickness skin loss and the second is full thickness skin loss - ?transient bacteremia from wounds as she does not have another obvious source on exam. Would check TTE to look for endocarditis - no stigmata of of IE on exam from what she would let me examine. CXR unrevealing aside from chronic emphysematous change with mild scarring on the right. Would hold on PICC for now until we clear bacteremia. Will need to follow her creatinine closely on vancomycin.   With no purulent wound drainage would stop contact precautions at this time to conserve hospital PPE.   Multiple drug allergies w/o mention as to reaction. Suspect some of these are sensitivities.   Unclear if proteus in the urine is playing a roll in her overall picture as she cannot discuss the presence of any urinary complaints. Likely what we are seeing is effect of her bacteremia - will treat bacteremia alone and observe clinical progression. She has had one dose of ceftriaxone.     Plan: 1. Continue vancomycin started by pharmacy.  2. Repeat blood cultures in AM  3. TTE 4. Hold on PICC line for now please 5. Follow creatinine and vanc levels.         [facility] Staphylococcus aureus bacteremia   Staphylococcus aureus bacteremia (SAB) is associated with a high rate of complications and mortality.  Specific aspects of clinical management are critical to optimizing the outcome of patients with SAB.  Therefore,  the [facility] Antimicrobial Management Team [facility]) has initiated an intervention aimed at improving the management of SAB at [facility].  To do so, Infectious Diseases physicians are providing an evidence-based consult for the management of all patients with SAB.     Yes No Comments  Perform follow-up blood cultures (even if the patient is afebrile) to ensure clearance of bacteremia [x]  []  4/24 order  Remove vascular catheter and obtain follow-up blood cultures after the removal of the catheter []  []  none  Perform echocardiography to evaluate for endocarditis (transthoracic ECHO is 40-50% sensitive, TEE is > 90% sensitive) [x]  []  TTE   Consult electrophysiologist to evaluate implanted cardiac device (pacemaker, ICD) []  []  n/a  Ensure source control []  []  Difficult to assess ROS/History with dementia. No other obvious source.    Investigate for "metastatic" sites of infection []  []  No other obvious source   Change antibiotic therapy to vancomycin  []  []  Beta-lactam antibiotics are preferred for MSSA due to higher cure rates.   If on Vancomycin, goal trough should be 15 - 20 mcg/mL  Estimated duration of IV antibiotic therapy:   []  []  Consult case management for probably prolonged outpatient IV antibiotic therapy     Principal Problem:   MRSA bacteremia Active Problems:   COPD (chronic obstructive pulmonary disease) (HCC)   GERD (gastroesophageal reflux disease)   Lower urinary tract infectious disease  CKD (chronic kidney disease), stage III (HCC)   Altered mental status   HTN (hypertension)   Dementia with behavioral problem (HCC)   Pressure injury of skin   . cholecalciferol  1,000 Units Oral Q breakfast  . docusate sodium  100 mg Oral BID  . dorzolamide-timolol  1 drop Both Eyes BID  . feeding supplement (ENSURE ENLIVE)  237 mL Oral QID  . ferrous sulfate  325 mg Oral Q breakfast  . fludrocortisone  0.1 mg Oral BH-q7a  . heparin  5,000 Units Subcutaneous Q8H  .  HYDROcodone-acetaminophen  1 tablet Oral QHS  . loratadine  10 mg Oral Daily  . mirtazapine  7.5 mg Oral QHS  . polyethylene glycol  17 g Oral Daily  . Travoprost (BAK Free)  1 drop Both Eyes QHS  . vitamin B-12  1,000 mcg Oral Daily    HPI: Taylor Roth is a 85 y.o. female with pmhx including stage 4 kidney disease, asthma,   She was admitted from [facility] for AMS and lethargy - later noted to have low BP 73/50 and temperature 101.4 F She was initially treated with antipyretics and fluids by EMS. Upon entry to hospital she only responded to pain and continued hypotension SBP<90.  DNR paper at bedside. She has   Review of Systems: Review of Systems  Unable to perform ROS: Dementia    Past Medical History:  Diagnosis Date  . Anxiety   . Arterial tortuosity (aorta) 12/11/2012  . Asthma   . Chronic kidney disease, stage IV (severe) ([place]) 12/11/2012  . Chronic mental illness   . CKD (chronic kidney disease), stage III ([place]) 01/14/2015  . Closed hip fracture requiring operative repair, right, with routine healing, subsequent encounter 12/19/2015  . Colitis   . COPD (chronic obstructive pulmonary disease) ([place])   . Decreased rectal sphincter tone 11/21/2012  . Depression   . Drug-seeking behavior 02/26/2011   Use of multiple md to obtain benzos/narcotics pt must sign contract to receive controlled meds.   Marland Kitchen Dysphagia 01/11/2015  . GERD (gastroesophageal reflux disease)   . Glaucoma 06/26/2011  . Glaucoma of both eyes   . Hearing loss, sensorineural 03/04/2014   [facility]; 02/26/14. There is not a medical or surgical treatment that would benefit her type of hearing loss. We discussed amplification in an overview fashion.    Marland Kitchen History of fall   . Insomnia, unspecified   . Macular degeneration 06/26/2011  . Migraines   . Nasal bone fracture 03/30/2014  . Osteoporosis   . Osteoporosis 02/26/2011  . Presence of right artificial hip joint   . Protein-calorie malnutrition,  severe ([place]) 12/10/2012  . S/P ORIF (open reduction internal fixation) fracture 01/08/2016  . Schatzki's ring 01/14/2015  . Scoliosis   . Substance abuse ([place])    Benbzos and Narcotics, she does not get any of those medicines from [facility], we have empathetically told her that.  . Vertigo   . Vitamin B deficiency   . Vitamin D deficiency 08/25/2016  . Weight loss     Social History   Tobacco Use  . Smoking status: Former Smoker    Packs/day: 0.50    Types: Cigarettes  . Smokeless tobacco: Never Used  Substance Use Topics  . Alcohol use: No  . Drug use: No    Family History  Problem Relation Age of Onset  . Stroke Mother   . Diabetes Mother   . Heart disease Mother   . Hearing loss  Mother   . Cancer Father 44       esophageal   Allergies  Allergen Reactions  . Amoxicillin     Per daughter  . Ampicillin     Per daughter  . Depakote [Valproic Acid]     DOES NOT WANT HER TO TAKE IT==Makes her hair fall out= per daughter who is poa  . Macrobid [Nitrofurantoin Macrocrystal] Rash    Per MAR  . Penicillins     Per daughter  . Azithromycin Other (See Comments)    Per MAR  . Doxycycline Other (See Comments)    Per MAR  . Escitalopram Oxalate Other (See Comments)    Per MAR  . Fioricet [Butalbital-Apap-Caffeine] Other (See Comments)    Drunk. Per MAR  . Latex Other (See Comments)    Per MAR  . Levofloxacin Other (See Comments)    Per MAR  . Nsaids Other (See Comments)    This is not an allergy. The patient was told by Dr. Rockne Menghini to avoid NSAIDs and stop Vicoprofen in to 2014 to avoid nephrotoxicity.  Per MAR.  Marland Kitchen Oxycodone Other (See Comments)    reaction to synthetic codeine Per MAR  . Restasis [Cyclosporine] Other (See Comments)    Per MAR  . Sulfa Antibiotics Other (See Comments)    Per MAR  . Biaxin [Clarithromycin] Rash    With burning sensation Per MAR    OBJECTIVE: Blood pressure 124/69, pulse 92, temperature (!) 96.9 F (36.1 C), temperature source  Oral, resp. rate 20, height 4\' 11"  (1.499 m), weight 39.7 kg, SpO2 97 %.  Physical Exam Vitals signs reviewed.  Constitutional:      Comments: Thin, malnourished chronically ill appearing woman laying in bed. Lethargic. Swats at me when I attempt to examine her.   HENT:     Mouth/Throat:     Mouth: Mucous membranes are dry.     Pharynx: Oropharynx is clear. No oropharyngeal exudate.     Comments: Edentulous  Eyes:     General: No scleral icterus. Cardiovascular:     Rate and Rhythm: Normal rate and regular rhythm.     Pulses: Normal pulses.     Heart sounds: No murmur.     Comments: NSR on tele Pulmonary:     Effort: Pulmonary effort is normal.     Breath sounds: Normal breath sounds. No rales.  Abdominal:     General: Bowel sounds are normal.     Palpations: Abdomen is soft.  Musculoskeletal:     Comments: Trace to 1+ edema to legs  Skin:    General: Skin is warm.     Capillary Refill: Capillary refill takes less than 2 seconds.     Coloration: Skin is pale.     Comments: Stage 2 pressure ulcer to sacrum with stage 1 on left hip. Both covered with foam. No drainage.     Lab Results Lab Results  Component Value Date   WBC 10.3 06/05/2018   HGB 10.8 (L) 06/05/2018   HCT 33.8 (L) 06/05/2018   MCV 95.5 06/05/2018   PLT 140 (L) 06/05/2018    Lab Results  Component Value Date   CREATININE 0.82 06/05/2018   BUN 15 06/05/2018   NA 145 06/05/2018   K 2.9 (L) 06/05/2018   CL 116 (H) 06/05/2018   CO2 21 (L) 06/05/2018    Lab Results  Component Value Date   ALT 16 06/05/2018   AST 22 06/05/2018   ALKPHOS 59 06/05/2018  BILITOT 0.5 06/05/2018     Microbiology: Recent Results (from the past 240 hour(s))  Blood Culture (routine x 2)     Status: None (Preliminary result)   Collection Time: 06/04/18 11:22 AM  Result Value Ref Range Status   Specimen Description BLOOD SITE NOT SPECIFIED  Final   Special Requests   Final    BOTTLES DRAWN AEROBIC AND ANAEROBIC Blood  Culture adequate volume   Culture  Setup Time   Final    GRAM POSITIVE COCCI ANAEROBIC BOTTLE ONLY Organism ID to follow CRITICAL RESULT CALLED TO, READ BACK BY AND VERIFIED WITH: Cristopher Estimable PharmD 9:20 06/05/18 ([email or web address]) Performed at [facility], [address], [place], [place] [address]    Culture GRAM POSITIVE COCCI  Final   Report Status PENDING  Incomplete  Blood Culture ID Panel (Reflexed)     Status: Abnormal   Collection Time: 06/04/18 11:22 AM  Result Value Ref Range Status   Enterococcus species NOT DETECTED NOT DETECTED Final   Listeria monocytogenes NOT DETECTED NOT DETECTED Final   Staphylococcus species DETECTED (A) NOT DETECTED Final    Comment: CRITICAL RESULT CALLED TO, READ BACK BY AND VERIFIED WITH: Cristopher Estimable PharmD 9:20 06/05/18 ([email or web address])    Staphylococcus aureus (BCID) DETECTED (A) NOT DETECTED Final    Comment: Methicillin (oxacillin)-resistant Staphylococcus aureus (MRSA). MRSA is predictably resistant to beta-lactam antibiotics (except ceftaroline). Preferred therapy is vancomycin unless clinically contraindicated. Patient requires contact precautions if  hospitalized. CRITICAL RESULT CALLED TO, READ BACK BY AND VERIFIED WITH: Cristopher Estimable PharmD 9:20 06/05/18 ([email or web address])    Methicillin resistance DETECTED (A) NOT DETECTED Final    Comment: CRITICAL RESULT CALLED TO, READ BACK BY AND VERIFIED WITH: Cristopher Estimable PharmD 9:20 06/05/18 ([email or web address])    Streptococcus species NOT DETECTED NOT DETECTED Final   Streptococcus agalactiae NOT DETECTED NOT DETECTED Final   Streptococcus pneumoniae NOT DETECTED NOT DETECTED Final   Streptococcus pyogenes NOT DETECTED NOT DETECTED Final   Acinetobacter baumannii NOT DETECTED NOT DETECTED Final   Enterobacteriaceae species NOT DETECTED NOT DETECTED Final   Enterobacter cloacae complex NOT DETECTED NOT DETECTED Final   Escherichia coli NOT DETECTED NOT DETECTED Final   Klebsiella oxytoca NOT DETECTED NOT DETECTED  Final   Klebsiella pneumoniae NOT DETECTED NOT DETECTED Final   Proteus species NOT DETECTED NOT DETECTED Final   Serratia marcescens NOT DETECTED NOT DETECTED Final   Haemophilus influenzae NOT DETECTED NOT DETECTED Final   Neisseria meningitidis NOT DETECTED NOT DETECTED Final   Pseudomonas aeruginosa NOT DETECTED NOT DETECTED Final   Candida albicans NOT DETECTED NOT DETECTED Final   Candida glabrata NOT DETECTED NOT DETECTED Final   Candida krusei NOT DETECTED NOT DETECTED Final   Candida parapsilosis NOT DETECTED NOT DETECTED Final   Candida tropicalis NOT DETECTED NOT DETECTED Final    Comment: Performed at [facility], [place]. [address] [place], [place] [address]  SARS Coronavirus 2 [facility] order, Performed in [facility] hospital lab)     Status: None   Collection Time: 06/04/18 11:25 AM  Result Value Ref Range Status   SARS Coronavirus 2 NEGATIVE NEGATIVE Final    Comment: (NOTE) If result is NEGATIVE SARS-CoV-2 target nucleic acids are NOT DETECTED. The SARS-CoV-2 RNA is generally detectable in upper and lower  respiratory specimens during the acute phase of infection. The lowest  concentration of SARS-CoV-2 viral copies this assay can detect is 250  copies / mL. A negative result does not preclude SARS-CoV-2 infection  and should not be used as the sole basis for treatment or other  patient management decisions.  A negative result may occur with  improper specimen collection / handling, submission of specimen other  than nasopharyngeal swab, presence of viral mutation(s) within the  areas targeted by this assay, and inadequate number of viral copies  (<250 copies / mL). A negative result must be combined with clinical  observations, patient history, and epidemiological information. If result is POSITIVE SARS-CoV-2 target nucleic acids are DETECTED. The SARS-CoV-2 RNA is generally detectable in upper and lower  respiratory specimens dur ing the acute phase of  infection.  Positive  results are indicative of active infection with SARS-CoV-2.  Clinical  correlation with patient history and other diagnostic information is  necessary to determine patient infection status.  Positive results do  not rule out bacterial infection or co-infection with other viruses. If result is PRESUMPTIVE POSTIVE SARS-CoV-2 nucleic acids MAY BE PRESENT.   A presumptive positive result was obtained on the submitted specimen  and confirmed on repeat testing.  While 2019 novel coronavirus  (SARS-CoV-2) nucleic acids may be present in the submitted sample  additional confirmatory testing may be necessary for epidemiological  and / or clinical management purposes  to differentiate between  SARS-CoV-2 and other Sarbecovirus currently known to infect humans.  If clinically indicated additional testing with an alternate test  methodology [phone]) is advised. The SARS-CoV-2 RNA is generally  detectable in upper and lower respiratory sp ecimens during the acute  phase of infection. The expected result is Negative. Fact Sheet for Patients:  StrictlyIdeas.no Fact Sheet for Healthcare Providers: BankingDealers.co.za This test is not yet approved or cleared by the [place] FDA and has been authorized for detection and/or diagnosis of SARS-CoV-2 by FDA under an Emergency Use Authorization (EUA).  This EUA will remain in effect (meaning this test can be used) for the duration of the COVID-19 declaration under Section 564(b)(1) of the Act, 21 U.S.C. section 360bbb-3(b)(1), unless the authorization is terminated or revoked sooner. Performed at [facility], [place] [address], [place], [place] [address]   Urine culture     Status: Abnormal (Preliminary result)   Collection Time: 06/04/18 11:42 AM  Result Value Ref Range Status   Specimen Description URINE, RANDOM  Final   Special Requests STERILE CONTAINER  Final   Culture  (A)  Final    80,000 COLONIES/mL PROTEUS MIRABILIS SUSCEPTIBILITIES TO FOLLOW Performed at [name], [place] [address], [place],  [address]    Report Status PENDING  Incomplete  Blood Culture (routine x 2)     Status: None (Preliminary result)   Collection Time: 06/04/18 11:50 AM  Result Value Ref Range Status   Specimen Description BLOOD RIGHT FOREARM  Final   Special Requests   Final    BOTTLES DRAWN AEROBIC AND ANAEROBIC Blood Culture adequate volume   Culture   Final    NO GROWTH < 24 HOURS Performed at [facility], [place] [address], [place],  [address]    Report Status PENDING  Incomplete    Janene Madeira, MSN, NP-C [place] [facility] [place] Pager: [phone]  06/05/2018 12:52 PM

## 2018-06-05 NOTE — Progress Notes (Signed)
PROGRESS NOTE    Taylor Roth  TOI:[id number] DOB: [birth year] DOA: 06/04/2018 PCP: [name], MD      Brief Narrative:  Taylor Roth is a 85 y.o. F with mild dementia, SNF bound, blindness, wheelchair-bound about 2 yrs, CKD III baseline Cr 1.0, COPD, anxiety, protein calorie malnutrition, and chronic decubitus ulcers who presents with decreased mentation, fever and hypotension.  In ER, temp 101, BP 76/46, HR 102.  Give fluids and ceftriaxone and hospitalist service consulted for sepsis.  Urine turbid, CXR clear.  Suspected UTI initially.      Assessment & Plan:  Sepsis from likely MRSA bacteremia Presented with fever, tachycardia, hypotension.  Responded to fluids.  Overnight, 1/2 blood cultures positive for MRSA. -Repeat cultures -Stop cetriaxone (agree with ID, pyuria is hard to characterize in absence of symptoms) -Start vancomycin -Daily BMP -Obtain echo   Acute metabolic encephalopathy superimposed on chronic dementia -Hold Seroquel -Hold benzodiaepines -Hold Norco  CKD III Stable Cr relative to baseline  Asthma/COPD No wheezing  Severe protein calorie malnutrition -Continue ensure if able  Other medications -Continue fluorinef, unclear indication -Continue mirtazapine -Continue loratadine -Continue lactulose, unclear indication  Normocytic anemia -Continue iron  Pressure injuries, stage II sacral ulcer stage: Left flank ulcer, both POA       MDM and disposition: Taylor below labs and imaging reports were reviewed and summarized above.  Medication management as above.  Taylor Roth was admitted with septic shock, hypotension from MRSA bacteremia.  Cultures now resulted, antibiotics narrowed to vancomycin.  This is an acute illness that poses a threat to life, limb, or bodily function, as well as an abrupt neurological change.  In extensive number of diagnoses or treatment options were managed        DVT prophylaxis: Heparin Code  Status: DO NOT RESUSCITATE Family Communication: daughter by phone,     Consultants:   ID  Procedures:   Echo pending  Antimicrobials:   Ceftriaxone 4/22 >> 4/23  Vancomycin 4/23 >>    Subjective: Somnolent, weak.  No respiratory distress.  Did have fever today.  No vomiting, diarrhea.  No apparent abdominal pain.  Objective: Vitals:   06/05/18 0522 06/05/18 0801 06/05/18 0830 06/05/18 1201  BP:   124/69   Pulse:   92   Resp:   20   Temp: 100 F (37.8 C) (!) 100.5 F (38.1 C)  (!) 96.9 F (36.1 C)  TempSrc: Axillary   Oral  SpO2:   97%   Weight:      Height:        Intake/Output Summary (Last 24 hours) at 06/05/2018 1347 Last data filed at 06/05/2018 8338 Gross per 24 hour  Intake 1818.18 ml  Output -  Net 1818.18 ml   Filed Weights   06/04/18 1104 06/04/18 1430  Weight: 37 kg 39.7 kg    Examination: General appearance: Frail elderly adult female, somnolent.  Arouses to noxious stimuli, says "all right", falls back asleep.  Does not open eyes. HEENT: Pupils small, equal.  No nasal deformity, discharge, or epistaxis.  Conjunctive are pink, lids and lashes atrophic.  Dentition poor, lips dry, oropharynx tacky dry, no oral lesions.  Hearing diminished.  Skin: Warm and dry.  no jaundice.  No suspicious rashes or lesions on exposed skin. Cardiac: RRR, nl S1-S2, no murmurs appreciated.  Capillary refill is brisk.  JVP not visible.  No LE edema.  Radial pulses 2+ and symmetric. Respiratory: Normal respiratory rate and rhythm.  CTAB without rales  or wheezes. Abdomen: Abdomen soft.  no TTP. No ascites, distension, hepatosplenomegaly.   MSK: No deformities or effusions of Taylor large joints of Taylor upper lower extremities bilaterally.  Diffuse severe loss of subcutaneous muscle mass and fat, temporal wasting is noted, thenar wasting is noted. Neuro: Somnolent.  Makes one-word answers to noxious stimuli/sternal rub.  Does not open eyes.  Not follow commands.  Speech fluent.     Psych: Unable to assess    Data Reviewed: I have personally reviewed following labs and imaging studies:  CBC: Recent Labs  Lab 06/04/18 1121 06/05/18 0320  WBC 10.6* 10.3  NEUTROABS 8.6*  --   HGB 11.4* 10.8*  HCT 36.8 33.8*  MCV 97.6 95.5  PLT 163 716*   Basic Metabolic Panel: Recent Labs  Lab 06/04/18 1121 06/05/18 0320  NA 144 145  K 3.8 2.9*  CL 111 116*  CO2 23 21*  GLUCOSE 119* 105*  BUN 25* 15  CREATININE 1.16* 0.82  CALCIUM 8.9 8.2*   GFR: Estimated Creatinine Clearance: 30.3 mL/min (by C-G formula based on SCr of 0.82 mg/dL). Liver Function Tests: Recent Labs  Lab 06/04/18 1121 06/05/18 0320  AST 22 22  ALT 16 16  ALKPHOS 66 59  BILITOT 0.5 0.5  PROT 5.9* 5.7*  ALBUMIN 2.8* 2.5*   No results for input(s): LIPASE, AMYLASE in Taylor last 168 hours. No results for input(s): AMMONIA in Taylor last 168 hours. Coagulation Profile: No results for input(s): INR, PROTIME in Taylor last 168 hours. Cardiac Enzymes: Recent Labs  Lab 06/04/18 1121  TROPONINI 0.04*   BNP (last 3 results) No results for input(s): PROBNP in Taylor last 8760 hours. HbA1C: No results for input(s): HGBA1C in Taylor last 72 hours. CBG: No results for input(s): GLUCAP in Taylor last 168 hours. Lipid Profile: No results for input(s): CHOL, HDL, LDLCALC, TRIG, CHOLHDL, LDLDIRECT in Taylor last 72 hours. Thyroid Function Tests: No results for input(s): TSH, T4TOTAL, FREET4, T3FREE, THYROIDAB in Taylor last 72 hours. Anemia Panel: No results for input(s): VITAMINB12, FOLATE, FERRITIN, TIBC, IRON, RETICCTPCT in Taylor last 72 hours. Urine analysis:    Component Value Date/Time   COLORURINE AMBER (A) 06/04/2018 1142   APPEARANCEUR TURBID (A) 06/04/2018 1142   LABSPEC 1.019 06/04/2018 1142   PHURINE 6.0 06/04/2018 1142   GLUCOSEU NEGATIVE 06/04/2018 1142   HGBUR SMALL (A) 06/04/2018 1142   BILIRUBINUR NEGATIVE 06/04/2018 1142   BILIRUBINUR neg 06/20/2013 1736   KETONESUR NEGATIVE 06/04/2018 1142    PROTEINUR 100 (A) 06/04/2018 1142   UROBILINOGEN 0.2 12/25/2014 1620   NITRITE NEGATIVE 06/04/2018 1142   LEUKOCYTESUR MODERATE (A) 06/04/2018 1142   Sepsis Labs: @LABRCNTIP (procalcitonin:4,lacticacidven:4)  ) Recent Results (from Taylor past 240 hour(s))  Blood Culture (routine x 2)     Status: None (Preliminary result)   Collection Time: 06/04/18 11:22 AM  Result Value Ref Range Status   Specimen Description BLOOD SITE NOT SPECIFIED  Final   Special Requests   Final    BOTTLES DRAWN AEROBIC AND ANAEROBIC Blood Culture adequate volume   Culture  Setup Time   Final    GRAM POSITIVE COCCI ANAEROBIC BOTTLE ONLY Organism ID to follow CRITICAL RESULT CALLED TO, READ BACK BY AND VERIFIED WITH: Cristopher Estimable PharmD 9:20 06/05/18 ([email or web address]) Performed at [facility], [place] [address], [place], [place] [address]    Culture [facility] POSITIVE COCCI  Final   Report Status PENDING  Incomplete  Blood Culture ID Panel (Reflexed)     Status:  Abnormal   Collection Time: 06/04/18 11:22 AM  Result Value Ref Range Status   Enterococcus species NOT DETECTED NOT DETECTED Final   Listeria monocytogenes NOT DETECTED NOT DETECTED Final   Staphylococcus species DETECTED (A) NOT DETECTED Final    Comment: CRITICAL RESULT CALLED TO, READ BACK BY AND VERIFIED WITH: Cristopher Estimable PharmD 9:20 06/05/18 ([email or web address])    Staphylococcus aureus (BCID) DETECTED (A) NOT DETECTED Final    Comment: Methicillin (oxacillin)-resistant Staphylococcus aureus (MRSA). MRSA is predictably resistant to beta-lactam antibiotics (except ceftaroline). Preferred therapy is vancomycin unless clinically contraindicated. Roth requires contact precautions if  hospitalized. CRITICAL RESULT CALLED TO, READ BACK BY AND VERIFIED WITH: Cristopher Estimable PharmD 9:20 06/05/18 ([email or web address])    Methicillin resistance DETECTED (A) NOT DETECTED Final    Comment: CRITICAL RESULT CALLED TO, READ BACK BY AND VERIFIED WITH: Cristopher Estimable PharmD 9:20 06/05/18  ([email or web address])    Streptococcus species NOT DETECTED NOT DETECTED Final   Streptococcus agalactiae NOT DETECTED NOT DETECTED Final   Streptococcus pneumoniae NOT DETECTED NOT DETECTED Final   Streptococcus pyogenes NOT DETECTED NOT DETECTED Final   Acinetobacter baumannii NOT DETECTED NOT DETECTED Final   Enterobacteriaceae species NOT DETECTED NOT DETECTED Final   Enterobacter cloacae complex NOT DETECTED NOT DETECTED Final   Escherichia coli NOT DETECTED NOT DETECTED Final   Klebsiella oxytoca NOT DETECTED NOT DETECTED Final   Klebsiella pneumoniae NOT DETECTED NOT DETECTED Final   Proteus species NOT DETECTED NOT DETECTED Final   Serratia marcescens NOT DETECTED NOT DETECTED Final   Haemophilus influenzae NOT DETECTED NOT DETECTED Final   Neisseria meningitidis NOT DETECTED NOT DETECTED Final   Pseudomonas aeruginosa NOT DETECTED NOT DETECTED Final   Candida albicans NOT DETECTED NOT DETECTED Final   Candida glabrata NOT DETECTED NOT DETECTED Final   Candida krusei NOT DETECTED NOT DETECTED Final   Candida parapsilosis NOT DETECTED NOT DETECTED Final   Candida tropicalis NOT DETECTED NOT DETECTED Final    Comment: Performed at [facility], [place]. [address] [place], [place] [address]  SARS Coronavirus 2 [facility] order, Performed in [facility] hospital lab)     Status: None   Collection Time: 06/04/18 11:25 AM  Result Value Ref Range Status   SARS Coronavirus 2 NEGATIVE NEGATIVE Final    Comment: (NOTE) If result is NEGATIVE SARS-CoV-2 target nucleic acids are NOT DETECTED. Taylor SARS-CoV-2 RNA is generally detectable in upper and lower  respiratory specimens during Taylor acute phase of infection. Taylor lowest  concentration of SARS-CoV-2 viral copies this assay can detect is 250  copies / mL. A negative result does not preclude SARS-CoV-2 infection  and should not be used as Taylor sole basis for treatment or other  Roth management decisions.  A negative result may occur with   improper specimen collection / handling, submission of specimen other  than nasopharyngeal swab, presence of viral mutation(s) within Taylor  areas targeted by this assay, and inadequate number of viral copies  (<250 copies / mL). A negative result must be combined with clinical  observations, Roth history, and epidemiological information. If result is POSITIVE SARS-CoV-2 target nucleic acids are DETECTED. Taylor SARS-CoV-2 RNA is generally detectable in upper and lower  respiratory specimens dur ing Taylor acute phase of infection.  Positive  results are indicative of active infection with SARS-CoV-2.  Clinical  correlation with Roth history and other diagnostic information is  necessary to determine Roth infection status.  Positive results do  not rule out bacterial infection or co-infection  with other viruses. If result is PRESUMPTIVE POSTIVE SARS-CoV-2 nucleic acids MAY BE PRESENT.   A presumptive positive result was obtained on Taylor submitted specimen  and confirmed on repeat testing.  While 2019 novel coronavirus  (SARS-CoV-2) nucleic acids may be present in Taylor submitted sample  additional confirmatory testing may be necessary for epidemiological  and / or clinical management purposes  to differentiate between  SARS-CoV-2 and other Sarbecovirus currently known to infect humans.  If clinically indicated additional testing with an alternate test  methodology [phone]) is advised. Taylor SARS-CoV-2 RNA is generally  detectable in upper and lower respiratory sp ecimens during Taylor acute  phase of infection. Taylor expected result is Negative. Fact Sheet for Patients:  StrictlyIdeas.no Fact Sheet for Healthcare Providers: BankingDealers.co.za This test is not yet approved or cleared by [place] FDA and has been authorized for detection and/or diagnosis of SARS-CoV-2 by FDA under an Emergency Use Authorization (EUA).  This EUA will  remain in effect (meaning this test can be used) for Taylor duration of Taylor COVID-19 declaration under Section 564(b)(1) of Taylor Act, 21 U.S.C. section 360bbb-3(b)(1), unless Taylor authorization is terminated or revoked sooner. Performed at [facility], [place] [address], [place], [place] [address]   Urine culture     Status: Abnormal (Preliminary result)   Collection Time: 06/04/18 11:42 AM  Result Value Ref Range Status   Specimen Description URINE, RANDOM  Final   Special Requests STERILE CONTAINER  Final   Culture (A)  Final    80,000 COLONIES/mL PROTEUS MIRABILIS SUSCEPTIBILITIES TO FOLLOW Performed at [facility], [place] [address] [place], [place] [address]    Report Status PENDING  Incomplete  Blood Culture (routine x 2)     Status: None (Preliminary result)   Collection Time: 06/04/18 11:50 AM  Result Value Ref Range Status   Specimen Description BLOOD RIGHT FOREARM  Final   Special Requests   Final    BOTTLES DRAWN AEROBIC AND ANAEROBIC Blood Culture adequate volume   Culture   Final    NO GROWTH < 24 HOURS Performed at [facility], [place] [address], [place], [place] [address]    Report Status PENDING  Incomplete         Radiology Studies: Dg Chest Portable 1 View  Result Date: 06/04/2018 CLINICAL DATA:  Fever with infusion EXAM: PORTABLE CHEST 1 VIEW COMPARISON:  Chest radiograph December 12, 2016 and chest CT May 14, 2017. FINDINGS: There is underlying emphysematous change, better delineated on prior CT. There is no edema or consolidation. There is mild scarring in Taylor right mid lower lung zones. There is no consolidation or edema. Heart size and pulmonary vascularity are normal. No adenopathy. There is aortic atherosclerosis. Bones are osteoporotic. IMPRESSION: Underlying emphysematous change with areas of mild scarring on Taylor right. No frank edema or consolidation. Heart size within normal limits. There is aortic atherosclerosis. Bones osteoporotic. Aortic  Atherosclerosis (ICD10-I70.0) and Emphysema (ICD10-J43.9). Electronically Signed   By: Lowella Grip III M.D.   On: 06/04/2018 12:25        Scheduled Meds: . cholecalciferol  1,000 Units Oral Q breakfast  . docusate sodium  100 mg Oral BID  . dorzolamide-timolol  1 drop Both Eyes BID  . feeding supplement (ENSURE ENLIVE)  237 mL Oral QID  . ferrous sulfate  325 mg Oral Q breakfast  . fludrocortisone  0.1 mg Oral [id number]  . heparin  5,000 Units Subcutaneous Q8H  . HYDROcodone-acetaminophen  1 tablet Oral QHS  . loratadine  10 mg Oral Daily  . mirtazapine  7.5 mg Oral QHS  . polyethylene glycol  17 g Oral Daily  . Travoprost (BAK Free)  1 drop Both Eyes QHS  . vitamin B-12  1,000 mcg Oral Daily   Continuous Infusions: . sodium chloride 75 mL/hr at 06/05/18 1057  . [START ON 06/07/2018] vancomycin       LOS: 1 day    Time spent: 35 minutes    Edwin Dada, MD [name] 06/05/2018, 1:47 PM     Please page through [place]:  [email or web address] Password [email or web address] If 7PM-7AM, please contact night-coverage

## 2018-06-06 ENCOUNTER — Inpatient Hospital Stay (HOSPITAL_COMMUNITY): Payer: Medicare Other

## 2018-06-06 DIAGNOSIS — F0281 Dementia in other diseases classified elsewhere with behavioral disturbance: Secondary | ICD-10-CM

## 2018-06-06 DIAGNOSIS — G9341 Metabolic encephalopathy: Secondary | ICD-10-CM

## 2018-06-06 DIAGNOSIS — L8990 Pressure ulcer of unspecified site, unspecified stage: Secondary | ICD-10-CM

## 2018-06-06 DIAGNOSIS — R8271 Bacteriuria: Secondary | ICD-10-CM

## 2018-06-06 DIAGNOSIS — R7881 Bacteremia: Secondary | ICD-10-CM

## 2018-06-06 DIAGNOSIS — N183 Chronic kidney disease, stage 3 (moderate): Secondary | ICD-10-CM

## 2018-06-06 DIAGNOSIS — G301 Alzheimer's disease with late onset: Secondary | ICD-10-CM

## 2018-06-06 DIAGNOSIS — H547 Unspecified visual loss: Secondary | ICD-10-CM

## 2018-06-06 DIAGNOSIS — R410 Disorientation, unspecified: Secondary | ICD-10-CM

## 2018-06-06 LAB — BASIC METABOLIC PANEL
Anion gap: 13 (ref 5–15)
BUN: 15 mg/dL (ref 8–23)
CO2: 21 mmol/L — ABNORMAL LOW (ref 22–32)
Calcium: 8.5 mg/dL — ABNORMAL LOW (ref 8.9–10.3)
Chloride: 111 mmol/L (ref 98–111)
Creatinine, Ser: 0.67 mg/dL (ref 0.44–1.00)
GFR calc Af Amer: >60 mL/min (ref >60)
GFR calc non Af Amer: >60 mL/min (ref >60)
Glucose, Bld: 100 mg/dL — ABNORMAL HIGH (ref 70–99)
Potassium: 2.4 mmol/L — CL (ref 3.5–5.1)
Sodium: 145 mmol/L (ref 135–145)

## 2018-06-06 LAB — URINE CULTURE: Culture: 80000 — AB

## 2018-06-06 LAB — CBC
HCT: 34.9 % — ABNORMAL LOW (ref 36.0–46.0)
Hemoglobin: 11.3 g/dL — ABNORMAL LOW (ref 12.0–15.0)
MCH: 30.1 pg (ref 26.0–34.0)
MCHC: 32.4 g/dL (ref 30.0–36.0)
MCV: 93.1 fL (ref 80.0–100.0)
Platelets: 167 10*3/uL (ref 150–400)
RBC: 3.75 MIL/uL — ABNORMAL LOW (ref 3.87–5.11)
RDW: 13.2 % (ref 11.5–15.5)
WBC: 8.6 10*3/uL (ref 4.0–10.5)
nRBC: 0 % (ref 0.0–0.2)

## 2018-06-06 LAB — ECHOCARDIOGRAM LIMITED
Height: 59 in
Weight: 1400.36 oz

## 2018-06-06 MED ORDER — POTASSIUM CHLORIDE CRYS ER 20 MEQ PO TBCR
40.0000 meq | EXTENDED_RELEASE_TABLET | Freq: Three times a day (TID) | ORAL | Status: AC
  Administered 2018-06-06 (×2): 40 meq via ORAL
  Filled 2018-06-06 (×3): qty 2

## 2018-06-06 MED ORDER — POTASSIUM CHLORIDE IN NACL 40-0.9 MEQ/L-% IV SOLN
INTRAVENOUS | Status: DC
  Administered 2018-06-06: 75 mL/h via INTRAVENOUS
  Filled 2018-06-06: qty 1000

## 2018-06-06 NOTE — NC FL2 (Signed)
[place] MEDICAID FL2 LEVEL OF CARE SCREENING TOOL     IDENTIFICATION  Patient Name: Taylor Roth Birthdate: [birth year] Sex: female Admission Date (Current Location): 06/04/2018  [facility] and [place] Number:  Herbalist and Address:  [facility], [place] [address] [place], [place] [address]      Provider Number: [id number]  Attending Physician Name and Address:  Edwin Dada, *  Relative Name and Phone Number:  [name] daughter, [phone]    Current Level of Care: Hospital Recommended Level of Care: [place] Prior Approval Number:    Date Approved/Denied:   PASRR Number: [id number]  Discharge Plan: SNF    Current Diagnoses: Patient Active Problem List   Diagnosis Date Noted  . Pressure injury of skin 06/05/2018  . MRSA bacteremia 06/04/2018  . Chronic generalized pain 05/16/2018  . Dry eye syndrome of both eyes 02/28/2018  . Abnormal EKG 01/24/2017  . Delusions ([place])   . Paranoia ([place])   . Vitamin D deficiency 08/25/2016  . Dementia with behavioral problem ([place]) 03/25/2016  . S/P ORIF (open reduction internal fixation) fracture 01/08/2016  . Oral thrush 01/08/2016  . HTN (hypertension) 01/08/2016  . Closed hip fracture requiring operative repair, right, with routine healing, subsequent encounter 12/19/2015  . Bipolar disease, chronic ([place]) 12/17/2015  . Decubitus ulcer of hip, stage 3 ([place]) 12/11/2015  . Legally blind 08/16/2015  . Ankle pain   . Encephalopathy   . Altered mental status 01/26/2015  . Nausea and vomiting 01/17/2015  . Esophageal dysmotilities   . Urinary tract infectious disease   . Weakness 01/14/2015  . Schatzki's ring 01/14/2015  . CKD (chronic kidney disease), stage III ([place]) 01/14/2015  . Esophagus disorder   . Dysphagia 01/11/2015  . Lower urinary tract infectious disease 03/30/2014  . Hearing loss, sensorineural 03/04/2014  . Nasal lesion 03/04/2014  . Constipation  12/12/2012  . Hypokalemia 12/11/2012  . Orthostatic hypotension 12/11/2012  . Arterial tortuosity (aorta) 12/11/2012  . Protein-calorie malnutrition, severe ([place]) 12/10/2012  . Adult failure to thrive 12/09/2012  . Decreased rectal sphincter tone 11/21/2012  . Primary open angle glaucoma of both eyes, severe stage 11/11/2012  . Idiopathic scoliosis 07/08/2012  . Macular atrophy, retinal 06/26/2011  . Vision disturbance 06/26/2011  . Glaucoma 06/26/2011  . Nonexudative senile macular degeneration of retina 06/21/2011  . Macula scar of posterior pole 06/21/2011  . Status post intraocular lens implant 06/21/2011  . Anemia of chronic disease 04/17/2011  . Drug-seeking behavior 02/26/2011  . Osteoporosis 02/26/2011  . Depression   . Anxiety   . COPD (chronic obstructive pulmonary disease) ([place])   . GERD (gastroesophageal reflux disease)   . Migraines   . Insomnia, unspecified     Orientation RESPIRATION BLADDER Height & Weight     Self  Normal Incontinent, External catheter Weight: 87 lb 8.4 oz (39.7 kg) Height:  4\' 11"  (149.9 cm)  BEHAVIORAL SYMPTOMS/MOOD NEUROLOGICAL BOWEL NUTRITION STATUS      Continent Diet(Please see DC Summary)  AMBULATORY STATUS COMMUNICATION OF NEEDS Skin   Extensive Assist Verbally PU Stage and Appropriate Care(Stage II on sacrum; Stage I on flank)                       Personal Care Assistance Level of Assistance  Bathing, Feeding, Dressing Bathing Assistance: Maximum assistance Feeding assistance: Maximum assistance Dressing Assistance: Maximum assistance     Functional Limitations Info  Sight, Hearing, Speech Sight Info:  Adequate Hearing Info: Adequate Speech Info: Adequate    SPECIAL CARE FACTORS FREQUENCY                       Contractures Contractures Info: Not present    Additional Factors Info  Code Status, Allergies, Isolation Precautions Code Status Info: DNR Allergies Info: Amoxicillin, Ampicillin, Depakote  Valproic Acid, Macrobid Nitrofurantoin Macrocrystal, Penicillins, Azithromycin, Doxycycline, Escitalopram Oxalate, Fioricet Butalbital-apap-caffeine, Latex, Levofloxacin, Nsaids, Oxycodone, Restasis Cyclosporine, Sulfa Antibiotics, Biaxin Clarithromycin     Isolation Precautions Info: MRSA + in blood     Current Medications (06/06/2018):  This is the current hospital active medication list Current Facility-Administered Medications  Medication Dose Route Frequency Provider Last Rate Last Dose  . acetaminophen (TYLENOL) tablet 1,000 mg  1,000 mg Oral QHS Edwin Dada, MD   1,000 mg at 06/05/18 2126  . acetaminophen (TYLENOL) tablet 650 mg  650 mg Oral Q6H PRN Elwyn Reach, MD   650 mg at 06/05/18 0843  . albuterol (PROVENTIL) (2.5 MG/3ML) 0.083% nebulizer solution 3 mL  3 mL Inhalation Q6H PRN Elwyn Reach, MD      . cholecalciferol (VITAMIN D3) tablet 1,000 Units  1,000 Units Oral Q breakfast Elwyn Reach, MD   1,000 Units at 06/06/18 [phone]  . docusate sodium (COLACE) capsule 100 mg  100 mg Oral BID Gala Romney L, MD   100 mg at 06/06/18 0942  . dorzolamide-timolol (COSOPT) 22.3-6.8 MG/ML ophthalmic solution 1 drop  1 drop Both Eyes BID Elwyn Reach, MD   1 drop at 06/06/18 0935  . feeding supplement (ENSURE ENLIVE) (ENSURE ENLIVE) liquid 237 mL  237 mL Oral QID Elwyn Reach, MD   237 mL at 06/06/18 0953  . ferrous sulfate tablet 325 mg  325 mg Oral Q breakfast Elwyn Reach, MD   325 mg at 06/06/18 0942  . fludrocortisone (FLORINEF) tablet 0.1 mg  0.1 mg Oral Charlott Rakes, Mohammad L, MD   0.1 mg at 06/06/18 0942  . heparin injection 5,000 Units  5,000 Units Subcutaneous Q8H Elwyn Reach, MD   5,000 Units at 06/06/18 0555  . lactulose (CHRONULAC) 10 GM/15ML solution 10 g  10 g Oral Daily PRN Gala Romney L, MD      . loratadine (CLARITIN) tablet 10 mg  10 mg Oral Daily Elwyn Reach, MD   10 mg at 06/06/18 0942  . mirtazapine (REMERON) tablet  7.5 mg  7.5 mg Oral QHS Gala Romney L, MD   7.5 mg at 06/05/18 2125  . nitroGLYCERIN (NITROSTAT) SL tablet 0.4 mg  0.4 mg Sublingual Q5 min PRN Gala Romney L, MD      . ondansetron (ZOFRAN) tablet 4 mg  4 mg Oral Q6H PRN Elwyn Reach, MD       Or  . ondansetron (ZOFRAN) injection 4 mg  4 mg Intravenous Q6H PRN Garba, Mohammad L, MD      . polyethylene glycol (MIRALAX / GLYCOLAX) packet 17 g  17 g Oral Daily Elwyn Reach, MD   17 g at 06/06/18 0942  . polyvinyl alcohol (LIQUIFILM TEARS) 1.4 % ophthalmic solution 1 drop  1 drop Both Eyes PRN Elwyn Reach, MD   1 drop at 06/05/18 2126  . potassium chloride SA (K-DUR) CR tablet 40 mEq  40 mEq Oral TID Edwin Dada, MD   40 mEq at 06/06/18 0942  . senna (SENOKOT) tablet 17.2 mg  2 tablet Oral BID  PRN Elwyn Reach, MD      . Travoprost (BAK Free) (TRAVATAN) 0.004 % ophthalmic solution SOLN 1 drop  1 drop Both Eyes QHS Elwyn Reach, MD   1 drop at 06/05/18 2127  . [START ON 06/07/2018] vancomycin (VANCOCIN) IVPB 750 mg/150 ml premix  750 mg Intravenous Q48H [place], Christopher P, MD      . vitamin B-12 (CYANOCOBALAMIN) tablet 1,000 mcg  1,000 mcg Oral Daily Elwyn Reach, MD   1,000 mcg at 06/06/18 6387     Discharge Medications: Please see discharge summary for a list of discharge medications.  Relevant Imaging Results:  Relevant Lab Results:   Additional Information SSN # [id number]  Benard Halsted, LCSW

## 2018-06-06 NOTE — Progress Notes (Signed)
  Echocardiogram 2D Echocardiogram has been performed.  Taylor Roth 06/06/2018, 2:45 PM

## 2018-06-06 NOTE — Progress Notes (Signed)
[place] for Infectious Disease  Date of Admission:  06/04/2018       Total days of antibiotics 3  Day 2 Vancomycin    Patient ID: Taylor Roth is a 85 y.o. female with  Principal Problem:   MRSA bacteremia Active Problems:   COPD (chronic obstructive pulmonary disease) ([place])   GERD (gastroesophageal reflux disease)   Lower urinary tract infectious disease   CKD (chronic kidney disease), stage III (HCC)   Altered mental status   HTN (hypertension)   Dementia with behavioral problem ([place])   Pressure injury of skin   . acetaminophen  1,000 mg Oral QHS  . cholecalciferol  1,000 Units Oral Q breakfast  . docusate sodium  100 mg Oral BID  . dorzolamide-timolol  1 drop Both Eyes BID  . feeding supplement (ENSURE ENLIVE)  237 mL Oral QID  . ferrous sulfate  325 mg Oral Q breakfast  . fludrocortisone  0.1 mg Oral BH-q7a  . heparin  5,000 Units Subcutaneous Q8H  . loratadine  10 mg Oral Daily  . mirtazapine  7.5 mg Oral QHS  . polyethylene glycol  17 g Oral Daily  . potassium chloride  40 mEq Oral TID  . Travoprost (BAK Free)  1 drop Both Eyes QHS  . vitamin B-12  1,000 mcg Oral Daily    SUBJECTIVE: Doing better today with mentation. She tells me she has no pain anywhere, no cough, chest pain. Not certain where she is presently.   Afebrile overnight (Tmax 99.7 F) WBC down to 8.6K. On vancomycin alone now. Repeat blood cultures 4/24 pending.   Review of Systems: Review of Systems  Constitutional: Negative for chills and fever.  HENT: Negative for tinnitus.   Eyes: Negative for blurred vision and photophobia.  Respiratory: Negative for cough and sputum production.   Cardiovascular: Negative for chest pain.  Gastrointestinal: Negative for diarrhea, nausea and vomiting.  Genitourinary: Negative for dysuria.  Skin: Negative for rash.  Neurological: Negative for headaches.    Allergies  Allergen Reactions  . Amoxicillin     Per daughter  .  Ampicillin     Per daughter  . Depakote [Valproic Acid]     DOES NOT WANT HER TO TAKE IT==Makes her hair fall out= per daughter who is poa  . Macrobid [Nitrofurantoin Macrocrystal] Rash    Per MAR  . Penicillins     Per daughter  . Azithromycin Other (See Comments)    Per MAR  . Doxycycline Other (See Comments)    Per MAR  . Escitalopram Oxalate Other (See Comments)    Per MAR  . Fioricet [Butalbital-Apap-Caffeine] Other (See Comments)    Drunk. Per MAR  . Latex Other (See Comments)    Per MAR  . Levofloxacin Other (See Comments)    Per MAR  . Nsaids Other (See Comments)    This is not an allergy. The patient was told by Dr. Rockne Menghini to avoid NSAIDs and stop Vicoprofen in to 2014 to avoid nephrotoxicity.  Per MAR.  Marland Kitchen Oxycodone Other (See Comments)    reaction to synthetic codeine Per MAR  . Restasis [Cyclosporine] Other (See Comments)    Per MAR  . Sulfa Antibiotics Other (See Comments)    Per MAR  . Biaxin [Clarithromycin] Rash    With burning sensation Per MAR    OBJECTIVE: Vitals:   06/06/18 0413 06/06/18 0555 06/06/18 0900 06/06/18 1226  BP: (!) 154/84  (!) 150/86 Marland Kitchen)  152/50  Pulse: 80 82 80 79  Resp: (!) 29 18 20 18   Temp: 98 F (36.7 C)  99.7 F (37.6 C) 99.4 F (37.4 C)  TempSrc: Oral  Oral Oral  SpO2:  98% 98% 99%  Weight:      Height:       Body mass index is 17.68 kg/m.  Physical Exam Constitutional:      Comments: Thin appearing, chronically malnourished elderly lady resting comfortably in bed.   HENT:     Mouth/Throat:     Mouth: Mucous membranes are moist.     Pharynx: Oropharynx is clear. No oropharyngeal exudate.  Cardiovascular:     Rate and Rhythm: Normal rate and regular rhythm.     Heart sounds: No murmur.  Pulmonary:     Effort: Pulmonary effort is normal.     Breath sounds: Normal breath sounds.  Skin:    General: Skin is warm.     Comments: Multiple areas of bruising.   Neurological:     Mental Status: She is alert. She is  disoriented.     Lab Results Lab Results  Component Value Date   WBC 8.6 06/06/2018   HGB 11.3 (L) 06/06/2018   HCT 34.9 (L) 06/06/2018   MCV 93.1 06/06/2018   PLT 167 06/06/2018    Lab Results  Component Value Date   CREATININE 0.67 06/06/2018   BUN 15 06/06/2018   NA 145 06/06/2018   K 2.4 (LL) 06/06/2018   CL 111 06/06/2018   CO2 21 (L) 06/06/2018    Lab Results  Component Value Date   ALT 16 06/05/2018   AST 22 06/05/2018   ALKPHOS 59 06/05/2018   BILITOT 0.5 06/05/2018     Microbiology: Recent Results (from the past 240 hour(s))  Blood Culture (routine x 2)     Status: Abnormal (Preliminary result)   Collection Time: 06/04/18 11:22 AM  Result Value Ref Range Status   Specimen Description BLOOD SITE NOT SPECIFIED  Final   Special Requests   Final    BOTTLES DRAWN AEROBIC AND ANAEROBIC Blood Culture adequate volume   Culture  Setup Time   Final    GRAM POSITIVE COCCI ANAEROBIC BOTTLE ONLY CRITICAL RESULT CALLED TO, READ BACK BY AND VERIFIED WITH: Cristopher Estimable PharmD 9:20 06/05/18 ([email or web address]) Performed at [facility], [address] [place], [place] [address]    Culture STAPHYLOCOCCUS AUREUS (A)  Final   Report Status PENDING  Incomplete  Blood Culture ID Panel (Reflexed)     Status: Abnormal   Collection Time: 06/04/18 11:22 AM  Result Value Ref Range Status   Enterococcus species NOT DETECTED NOT DETECTED Final   Listeria monocytogenes NOT DETECTED NOT DETECTED Final   Staphylococcus species DETECTED (A) NOT DETECTED Final    Comment: CRITICAL RESULT CALLED TO, READ BACK BY AND VERIFIED WITH: Cristopher Estimable PharmD 9:20 06/05/18 ([email or web address])    Staphylococcus aureus (BCID) DETECTED (A) NOT DETECTED Final    Comment: Methicillin (oxacillin)-resistant Staphylococcus aureus (MRSA). MRSA is predictably resistant to beta-lactam antibiotics (except ceftaroline). Preferred therapy is vancomycin unless clinically contraindicated. Patient requires contact  precautions if  hospitalized. CRITICAL RESULT CALLED TO, READ BACK BY AND VERIFIED WITH: Cristopher Estimable PharmD 9:20 06/05/18 (wilsonm)    Methicillin resistance DETECTED (A) NOT DETECTED Final    Comment: CRITICAL RESULT CALLED TO, READ BACK BY AND VERIFIED WITH: Cristopher Estimable PharmD 9:20 06/05/18 (wilsonm)    Streptococcus species NOT DETECTED NOT DETECTED Final   Streptococcus  agalactiae NOT DETECTED NOT DETECTED Final   Streptococcus pneumoniae NOT DETECTED NOT DETECTED Final   Streptococcus pyogenes NOT DETECTED NOT DETECTED Final   Acinetobacter baumannii NOT DETECTED NOT DETECTED Final   Enterobacteriaceae species NOT DETECTED NOT DETECTED Final   Enterobacter cloacae complex NOT DETECTED NOT DETECTED Final   Escherichia coli NOT DETECTED NOT DETECTED Final   Klebsiella oxytoca NOT DETECTED NOT DETECTED Final   Klebsiella pneumoniae NOT DETECTED NOT DETECTED Final   Proteus species NOT DETECTED NOT DETECTED Final   Serratia marcescens NOT DETECTED NOT DETECTED Final   Haemophilus influenzae NOT DETECTED NOT DETECTED Final   Neisseria meningitidis NOT DETECTED NOT DETECTED Final   Pseudomonas aeruginosa NOT DETECTED NOT DETECTED Final   Candida albicans NOT DETECTED NOT DETECTED Final   Candida glabrata NOT DETECTED NOT DETECTED Final   Candida krusei NOT DETECTED NOT DETECTED Final   Candida parapsilosis NOT DETECTED NOT DETECTED Final   Candida tropicalis NOT DETECTED NOT DETECTED Final    Comment: Performed at [facility], [place] [address], [place], [place] [address]  SARS Coronavirus 2 [facility] order, Performed in [facility] hospital lab)     Status: None   Collection Time: 06/04/18 11:25 AM  Result Value Ref Range Status   SARS Coronavirus 2 NEGATIVE NEGATIVE Final    Comment: (NOTE) If result is NEGATIVE SARS-CoV-2 target nucleic acids are NOT DETECTED. The SARS-CoV-2 RNA is generally detectable in upper and lower  respiratory specimens during the acute phase of  infection. The lowest  concentration of SARS-CoV-2 viral copies this assay can detect is 250  copies / mL. A negative result does not preclude SARS-CoV-2 infection  and should not be used as the sole basis for treatment or other  patient management decisions.  A negative result may occur with  improper specimen collection / handling, submission of specimen other  than nasopharyngeal swab, presence of viral mutation(s) within the  areas targeted by this assay, and inadequate number of viral copies  (<250 copies / mL). A negative result must be combined with clinical  observations, patient history, and epidemiological information. If result is POSITIVE SARS-CoV-2 target nucleic acids are DETECTED. The SARS-CoV-2 RNA is generally detectable in upper and lower  respiratory specimens dur ing the acute phase of infection.  Positive  results are indicative of active infection with SARS-CoV-2.  Clinical  correlation with patient history and other diagnostic information is  necessary to determine patient infection status.  Positive results do  not rule out bacterial infection or co-infection with other viruses. If result is PRESUMPTIVE POSTIVE SARS-CoV-2 nucleic acids MAY BE PRESENT.   A presumptive positive result was obtained on the submitted specimen  and confirmed on repeat testing.  While 2019 novel coronavirus  (SARS-CoV-2) nucleic acids may be present in the submitted sample  additional confirmatory testing may be necessary for epidemiological  and / or clinical management purposes  to differentiate between  SARS-CoV-2 and other Sarbecovirus currently known to infect humans.  If clinically indicated additional testing with an alternate test  methodology [phone]) is advised. The SARS-CoV-2 RNA is generally  detectable in upper and lower respiratory sp ecimens during the acute  phase of infection. The expected result is Negative. Fact Sheet for Patients:   StrictlyIdeas.no Fact Sheet for Healthcare Providers: BankingDealers.co.za This test is not yet approved or cleared by the [place] FDA and has been authorized for detection and/or diagnosis of SARS-CoV-2 by FDA under an Emergency Use Authorization (EUA).  This EUA will remain in effect (meaning this test can be used) for the duration of the COVID-19 declaration under Section 564(b)(1) of the Act, 21 U.S.C. section 360bbb-3(b)(1), unless the authorization is terminated or revoked sooner. Performed at [facility], [place] [address] [place], [place] [address]   Urine culture     Status: Abnormal   Collection Time: 06/04/18 11:42 AM  Result Value Ref Range Status   Specimen Description URINE, RANDOM  Final   Special Requests   Final    STERILE CONTAINER Performed at [facility], [place] [address] [place], [place] [address]    Culture 80,000 COLONIES/mL PROTEUS MIRABILIS (A)  Final   Report Status 06/06/2018 FINAL  Final   Organism ID, Bacteria PROTEUS MIRABILIS (A)  Final      Susceptibility   Proteus mirabilis - MIC*    AMPICILLIN <=2 SENSITIVE Sensitive     CEFAZOLIN <=4 SENSITIVE Sensitive     CEFTRIAXONE <=1 SENSITIVE Sensitive     CIPROFLOXACIN 2 INTERMEDIATE Intermediate     GENTAMICIN <=1 SENSITIVE Sensitive     IMIPENEM 2 SENSITIVE Sensitive     NITROFURANTOIN 128 RESISTANT Resistant     TRIMETH/SULFA <=20 SENSITIVE Sensitive     AMPICILLIN/SULBACTAM <=2 SENSITIVE Sensitive     PIP/TAZO <=4 SENSITIVE Sensitive     * 80,000 COLONIES/mL PROTEUS MIRABILIS  Blood Culture (routine x 2)     Status: None (Preliminary result)   Collection Time: 06/04/18 11:50 AM  Result Value Ref Range Status   Specimen Description BLOOD RIGHT FOREARM  Final   Special Requests   Final    BOTTLES DRAWN AEROBIC AND ANAEROBIC Blood Culture adequate volume   Culture   Final    NO GROWTH 2 DAYS Performed at [facility], [address] [place], [place] [address]    Report Status PENDING  Incomplete  Culture, blood (routine x 2)     Status: None (Preliminary result)   Collection Time: 06/05/18  9:28 AM  Result Value Ref Range Status   Specimen Description BLOOD RIGHT HAND  Final   Special Requests   Final    BOTTLES DRAWN AEROBIC ONLY Blood Culture adequate volume   Culture   Final    NO GROWTH 1 DAY Performed at [facility], [place] [address] [place], [place] [address]    Report Status PENDING  Incomplete  Culture, blood (routine x 2)     Status: None (Preliminary result)   Collection Time: 06/05/18  9:40 AM  Result Value Ref Range Status   Specimen Description BLOOD LEFT ANTECUBITAL  Final   Special Requests   Final    BOTTLES DRAWN AEROBIC AND ANAEROBIC Blood Culture adequate volume   Culture   Final  1.   NO GROWTH 1 DAY Performed at [facility], [place] [address], [place], [place] [address]    Report Status PENDING  Incomplete     ASSESSMENT & PLAN:  1. MRSA Bacteremia = positive BCx 4/22 with no growth on 4/23 and 4/24 follow up cultures. TTE was unable to be done d/t disorientation however I think she is at a better place to ask echo team to come back for the study. This will be helpful to ensure appropriate treatment duration. No other findings suggestive of source aside from possible transient bacteremia from open wounds.   2. Medication Monitoring = creatinine stable for now on vancomycin therapy. Appreciate pharmacy team assistance.   3.  Vascular Access = hold on PICC until proven clearance of infection please   4. Dementia = pleasant today. She is also blind which I am sure makes this more difficult for her. She was cooperative today and much more alert than yesterday   5. Skin Tears/Pressure Wounds = foam dressing care per nursing skin care protocol. Wounds look clean and w/o exudate.   6. Contact Precautions = discussed with IP and prefer to keep her on contact at this time. Will  put order back in.   7. Proteus Urine Culture = unclear if her urine culture in light of MRSA bacteremia is contributing. Reassuring that she is more alert today with more stable hemodynamics. She received a dose of ceftriaxone on 4/22. Continue to hold on further treatment at this time and observe.   Janene Madeira, MSN, NP-C [facility] for Infectious [place] Cell: [phone] Pager: [phone]  06/06/2018  2:29 PM

## 2018-06-06 NOTE — Progress Notes (Signed)
Initial Nutrition Assessment   RD working remotely  [place]:   Underweight  INTERVENTION:    Ensure Enlive po QID, each supplement provides 350 kcal and 20 grams of protein  Short term nutrition support may be warranted - need to address with family whether they would want to proceed   NUTRITION DIAGNOSIS:   Inadequate oral intake related to (acute metabolic encephalopathy) as evidenced by meal completion < 25%  GOAL:   Patient will meet greater than or equal to 90% of their needs  MONITOR:   PO intake, Supplement acceptance, Labs, Skin, Weight trends, I & O's  REASON FOR ASSESSMENT:   Low Braden  ASSESSMENT:   85 y.o. Female with dementia admitted from SNF with lethargy, fever and hypotension.  Admit dx include: Dehydration [E86.0] Acute cystitis without hematuria [N30.00] AKI (acute kidney injury) ([place]) [N17.9] Sepsis, due to unspecified organism, unspecified whether acute organ dysfunction present ([place]) [A41.9]  RD unable to speak with pt to obtain nutrition hx. Pt with decreased mentation and dementia. Presented from [facility].  Per PTA medication list, pt receives Ensure QID at SNF. Pt not eating; PO intake 0% per flowsheet records. Medications include ferrous sulfate & Vit B 12.  Labs reviewed. K 2.4 (L).  Low braden score places patient at risk for further skin breakdown.  NUTRITION - FOCUSED PHYSICAL EXAM:  Unable to complete, however, highly suspect malnutrition  Diet Order:   Diet Order            Diet Heart Room service appropriate? Yes; Fluid consistency: Thin  Diet effective now             EDUCATION NEEDS:   Not appropriate for education at this time  Skin:  Skin Assessment: Skin Integrity Issues: Skin Integrity Issues:: Stage II, Stage I Stage I: L lower flank Stage II: sacrum  Last BM:  PTA  Height:   Ht Readings from Last 1 Encounters:  06/04/18 4\' 11"  (1.499 m)   Weight:   Wt Readings from Last 1  Encounters:  06/04/18 39.7 kg   BMI:  Body mass index is 17.68 kg/m.  Estimated Nutritional Needs:   Kcal:  1200-1400  Protein:  60-75 gm  Fluid:  >/= 1.5 L  Arthur Holms, RD, LDN Pager #: [phone] After-Hours Pager #: [phone]

## 2018-06-06 NOTE — Progress Notes (Signed)
CRITICAL VALUE ALERT  Critical Value: Potassium 2.4  Date & Time Notied: 06/06/18 0755  Provider Notified: Dr. Loleta Books  Orders Received/Actions taken: Doctor ordered, awaiting orders. Will continue to monitor.

## 2018-06-06 NOTE — TOC Initial Note (Addendum)
Transition of Care [facility]) - Initial/Assessment Note    Patient Details  Name: Taylor Roth MRN: [id number] Date of Birth: 10/02/1928  Transition of Care [facility]) CM/SW Contact:    Benard Halsted, LCSW Phone Number: 06/06/2018, 11:03 AM  Clinical Narrative:                 Pt is a 85 y.o. F with mild dementia, SNF bound, blindness, wheelchair-bound about 2 yrs, CKD III baseline Cr 1.0, COPD, anxiety, protein calorie malnutrition, and chronic decubitus ulcers who presents with decreased mentation, fever and hypotension.  Patient resides under long term care at [facility]. Patient's daughter confirmed that she will return there at discharge.   Expected Discharge Plan: Skilled Nursing Facility Barriers to Discharge: Continued Medical Work up   Patient Goals and CMS Choice Patient states their goals for this hospitalization and ongoing recovery are:: Return to SNF   Choice offered to / list presented to : NA  Expected Discharge Plan and Services Expected Discharge Plan: [place] In-house Referral: NA Discharge Planning Services: NA Post Acute Care Choice: [place] Living arrangements for the past 2 months: [place]                 DME Arranged: N/A DME Agency: NA       HH Arranged: NA HH Agency: NA        Prior Living Arrangements/Services Living arrangements for the past 2 months: [place] Lives with:: Facility Resident Patient language and need for interpreter reviewed:: Yes Do you feel safe going back to the place where you live?: Yes      Need for Family Participation in Patient Care: Yes (Comment) Care giver support system in place?: Yes (comment)   Criminal Activity/Legal Involvement Pertinent to Current Situation/Hospitalization: No - Comment as needed  Activities of Daily Living      Permission Sought/Granted Permission sought to share information with : Facility Sport and exercise psychologist, Family  Supports Permission granted to share information with : No  Share Information with NAME: [name]  Permission granted to share info w AGENCY: [place] granted to share info w Relationship: Daughter  Permission granted to share info w Contact Information: [phone]  Emotional Assessment Appearance:: Appears stated age Attitude/Demeanor/Rapport: Unable to Assess Affect (typically observed): Unable to Assess Orientation: : Oriented to Self Alcohol / Substance Use: Not Applicable Psych Involvement: No (comment)  Admission diagnosis:  Dehydration [E86.0] Acute cystitis without hematuria [N30.00] AKI (acute kidney injury) ([place]) [N17.9] Sepsis, due to unspecified organism, unspecified whether acute organ dysfunction present [facility]) [A41.9] Patient Active Problem List   Diagnosis Date Noted  . Pressure injury of skin 06/05/2018  . MRSA bacteremia 06/04/2018  . Chronic generalized pain 05/16/2018  . Dry eye syndrome of both eyes 02/28/2018  . Abnormal EKG 01/24/2017  . Delusions ([place])   . Paranoia ([place])   . Vitamin D deficiency 08/25/2016  . Dementia with behavioral problem ([place]) 03/25/2016  . S/P ORIF (open reduction internal fixation) fracture 01/08/2016  . Oral thrush 01/08/2016  . HTN (hypertension) 01/08/2016  . Closed hip fracture requiring operative repair, right, with routine healing, subsequent encounter 12/19/2015  . Bipolar disease, chronic ([place]) 12/17/2015  . Decubitus ulcer of hip, stage 3 ([place]) 12/11/2015  . Legally blind 08/16/2015  . Ankle pain   . Encephalopathy   . Altered mental status 01/26/2015  . Nausea and vomiting 01/17/2015  . Esophageal dysmotilities   . Urinary tract infectious  disease   . Weakness 01/14/2015  . Schatzki's ring 01/14/2015  . CKD (chronic kidney disease), stage III ([place]) 01/14/2015  . Esophagus disorder   . Dysphagia 01/11/2015  . Lower urinary tract infectious disease 03/30/2014  . Hearing loss, sensorineural 03/04/2014   . Nasal lesion 03/04/2014  . Constipation 12/12/2012  . Hypokalemia 12/11/2012  . Orthostatic hypotension 12/11/2012  . Arterial tortuosity (aorta) 12/11/2012  . Protein-calorie malnutrition, severe ([place]) 12/10/2012  . Adult failure to thrive 12/09/2012  . Decreased rectal sphincter tone 11/21/2012  . Primary open angle glaucoma of both eyes, severe stage 11/11/2012  . Idiopathic scoliosis 07/08/2012  . Macular atrophy, retinal 06/26/2011  . Vision disturbance 06/26/2011  . Glaucoma 06/26/2011  . Nonexudative senile macular degeneration of retina 06/21/2011  . Macula scar of posterior pole 06/21/2011  . Status post intraocular lens implant 06/21/2011  . Anemia of chronic disease 04/17/2011  . Drug-seeking behavior 02/26/2011  . Osteoporosis 02/26/2011  . Depression   . Anxiety   . COPD (chronic obstructive pulmonary disease) ([place])   . GERD (gastroesophageal reflux disease)   . Migraines   . Insomnia, unspecified    PCP:  [name], MD Pharmacy:   [place], [place] [address] Phone: [phone] Fax: [phone]     Social Determinants of Health (SDOH) Interventions    Readmission Risk Interventions Readmission Risk Prevention Plan 06/06/2018  Transportation Screening Complete  PCP or Specialist Appt within 5-7 Days Complete  Home Care Screening Complete  Medication Review (RN CM) Complete  Some recent data might be hidden

## 2018-06-06 NOTE — Progress Notes (Signed)
PROGRESS NOTE    Taylor Roth  JOA:[id number] DOB: 07/10/1928 DOA: 06/04/2018 PCP: [name], MD      Brief Narrative:  Taylor Roth is a 85 y.o. F with mild dementia, SNF bound, blindness, wheelchair-bound about 2 yrs, CKD III baseline Cr 1.0, COPD, anxiety, protein calorie malnutrition, and chronic decubitus ulcers who presents with decreased mentation, fever and hypotension.  In ER, temp 101, BP 76/46, HR 102.  Give fluids and ceftriaxone and hospitalist service consulted for sepsis.  Urine turbid, CXR clear.  Suspected UTI initially.      Assessment & Plan:  Sepsis from likely MRSA bacteremia Presented with fever, tachycardia, hypotension.  Responded to fluids.  1/2 blood cultures positive for MRSA.  Repeat cultures no growth.  No further fever.  Echo pending. -Continue vancomycin -Daily BMP   Acute metabolic encephalopathy superimposed on chronic dementia Improving.  At baseline, patient is oriented to place, situation, not year.  Currently she is still quite altered. -Hold Seroquel, BZD, Nroco  CKD III Stable  Asthma/COPD No wheezing  Severe protein calorie malnutrition -Continue ensure if able  Other medications -Continue fluorinef, unclear indication -Continue mirtazapine -Continue loratadine -Continue lactulose, unclear indication  Normocytic anemia -Continue iron  Pressure injuries, stage II sacral ulcer stage: Left flank ulcer, both POA       MDM and disposition: The labs and imaging reports were reviewed and summarized above.  Medication management as above.  The patient was admitted with septic shock, hypotension from MRSA bacteremia.  Cultures are now resulted, and antibiotics are narrowed to vancomycin.  This is an acute illness that poses a threat to life, limb, and bodily function as well as abrupt neurological change.  An extensive number of diagnoses or treatment options were managed.       DVT prophylaxis: Heparin  Code Status: DO NOT RESUSCITATE Family Communication: Daughter by phone    Consultants:   ID  Procedures:   Echo pending  Antimicrobials:   Ceftriaxone 4/22 >> 4/23  Vancomycin 4/23 >>    Subjective: More alert, but still quite disoriented, attention diminished.  No chest pain, dyspnea, cough, sputum.  No dysuria, abdominal pain.  No vomiting, diarrhea, fever.  Objective: Vitals:   06/06/18 0555 06/06/18 0900 06/06/18 1226 06/06/18 1450  BP:  (!) 150/86 (!) 152/50 130/79  Pulse: 82 80 79 78  Resp: 18 20 18 16   Temp:  99.7 F (37.6 C) 99.4 F (37.4 C) 98.4 F (36.9 C)  TempSrc:  Oral Oral Oral  SpO2: 98% 98% 99% 96%  Weight:      Height:        Intake/Output Summary (Last 24 hours) at 06/06/2018 1628 Last data filed at 06/06/2018 6010 Gross per 24 hour  Intake 240 ml  Output 550 ml  Net -310 ml   Filed Weights   06/04/18 1104 06/04/18 1430  Weight: 37 kg 39.7 kg    Examination: General appearance: Frail elderly female, gives 1 word answers, tries to follow by movement across the room with her eyes (although she is blind) HEENT: Pupils equal, reactive, no nasal deformity, discharge, or epistaxis.  Conjunctive are normal, lids atrophic.  Dentition normal, lips dry, oropharynx dry normal no oral lesions.  Hearing diminished. Skin: Warm and dry, no jaundice, no suspicious rashes or lesions. Cardiac: Regular rate and rhythm, no murmurs, JVP not visible, no lower extremity edema. Respiratory: Normal respiratory rate and rhythm, lungs clear without rales or wheezes abdomen: Soft, nonfocal tenderness to palpation, mild,  no rebound or guarding..   MSK: Diffuse severe loss of subcutaneous muscle mass and fat, thenar wasting Neuro: Responds to verbal stimuli, commands slowly.  Not oriented to place, situation.  Moves all extremities with global weakness, coordination and strength appears symmetric however.  Speech fluent    Psych: Attention diminished, affect blunted,  judgment and insight appear severely impaired.  She is not oriented to place or situation    Data Reviewed: I have personally reviewed following labs and imaging studies:  CBC: Recent Labs  Lab 06/04/18 1121 06/05/18 0320 06/06/18 0646  WBC 10.6* 10.3 8.6  NEUTROABS 8.6*  --   --   HGB 11.4* 10.8* 11.3*  HCT 36.8 33.8* 34.9*  MCV 97.6 95.5 93.1  PLT 163 140* 409   Basic Metabolic Panel: Recent Labs  Lab 06/04/18 1121 06/05/18 0320 06/06/18 0646  NA 144 145 145  K 3.8 2.9* 2.4*  CL 111 116* 111  CO2 23 21* 21*  GLUCOSE 119* 105* 100*  BUN 25* 15 15  CREATININE 1.16* 0.82 0.67  CALCIUM 8.9 8.2* 8.5*   GFR: Estimated Creatinine Clearance: 31.1 mL/min (by C-G formula based on SCr of 0.67 mg/dL). Liver Function Tests: Recent Labs  Lab 06/04/18 1121 06/05/18 0320  AST 22 22  ALT 16 16  ALKPHOS 66 59  BILITOT 0.5 0.5  PROT 5.9* 5.7*  ALBUMIN 2.8* 2.5*   No results for input(s): LIPASE, AMYLASE in the last 168 hours. No results for input(s): AMMONIA in the last 168 hours. Coagulation Profile: No results for input(s): INR, PROTIME in the last 168 hours. Cardiac Enzymes: Recent Labs  Lab 06/04/18 1121  TROPONINI 0.04*   BNP (last 3 results) No results for input(s): PROBNP in the last 8760 hours. HbA1C: No results for input(s): HGBA1C in the last 72 hours. CBG: No results for input(s): GLUCAP in the last 168 hours. Lipid Profile: No results for input(s): CHOL, HDL, LDLCALC, TRIG, CHOLHDL, LDLDIRECT in the last 72 hours. Thyroid Function Tests: No results for input(s): TSH, T4TOTAL, FREET4, T3FREE, THYROIDAB in the last 72 hours. Anemia Panel: No results for input(s): VITAMINB12, FOLATE, FERRITIN, TIBC, IRON, RETICCTPCT in the last 72 hours. Urine analysis:    Component Value Date/Time   COLORURINE AMBER (A) 06/04/2018 1142   APPEARANCEUR TURBID (A) 06/04/2018 1142   LABSPEC 1.019 06/04/2018 1142   PHURINE 6.0 06/04/2018 1142   GLUCOSEU NEGATIVE  06/04/2018 1142   HGBUR SMALL (A) 06/04/2018 1142   BILIRUBINUR NEGATIVE 06/04/2018 1142   BILIRUBINUR neg 06/20/2013 1736   KETONESUR NEGATIVE 06/04/2018 1142   PROTEINUR 100 (A) 06/04/2018 1142   UROBILINOGEN 0.2 12/25/2014 1620   NITRITE NEGATIVE 06/04/2018 1142   LEUKOCYTESUR MODERATE (A) 06/04/2018 1142   Sepsis Labs: @LABRCNTIP (procalcitonin:4,lacticacidven:4)  ) Recent Results (from the past 240 hour(s))  Blood Culture (routine x 2)     Status: Abnormal (Preliminary result)   Collection Time: 06/04/18 11:22 AM  Result Value Ref Range Status   Specimen Description BLOOD SITE NOT SPECIFIED  Final   Special Requests   Final    BOTTLES DRAWN AEROBIC AND ANAEROBIC Blood Culture adequate volume   Culture  Setup Time   Final    GRAM POSITIVE COCCI ANAEROBIC BOTTLE ONLY CRITICAL RESULT CALLED TO, READ BACK BY AND VERIFIED WITH: Cristopher Estimable PharmD 9:20 06/05/18 ([email or web address]) Performed at [facility], [place] [address], [place], [place] [address]    Culture STAPHYLOCOCCUS AUREUS (A)  Final   Report Status PENDING  Incomplete  Blood Culture ID Panel (Reflexed)     Status: Abnormal   Collection Time: 06/04/18 11:22 AM  Result Value Ref Range Status   Enterococcus species NOT DETECTED NOT DETECTED Final   Listeria monocytogenes NOT DETECTED NOT DETECTED Final   Staphylococcus species DETECTED (A) NOT DETECTED Final    Comment: CRITICAL RESULT CALLED TO, READ BACK BY AND VERIFIED WITH: Cristopher Estimable PharmD 9:20 06/05/18 ([email or web address])    Staphylococcus aureus (BCID) DETECTED (A) NOT DETECTED Final    Comment: Methicillin (oxacillin)-resistant Staphylococcus aureus (MRSA). MRSA is predictably resistant to beta-lactam antibiotics (except ceftaroline). Preferred therapy is vancomycin unless clinically contraindicated. Patient requires contact precautions if  hospitalized. CRITICAL RESULT CALLED TO, READ BACK BY AND VERIFIED WITH: Cristopher Estimable PharmD 9:20 06/05/18 ([email or web address])    Methicillin  resistance DETECTED (A) NOT DETECTED Final    Comment: CRITICAL RESULT CALLED TO, READ BACK BY AND VERIFIED WITH: Cristopher Estimable PharmD 9:20 06/05/18 ([email or web address])    Streptococcus species NOT DETECTED NOT DETECTED Final   Streptococcus agalactiae NOT DETECTED NOT DETECTED Final   Streptococcus pneumoniae NOT DETECTED NOT DETECTED Final   Streptococcus pyogenes NOT DETECTED NOT DETECTED Final   Acinetobacter baumannii NOT DETECTED NOT DETECTED Final   Enterobacteriaceae species NOT DETECTED NOT DETECTED Final   Enterobacter cloacae complex NOT DETECTED NOT DETECTED Final   Escherichia coli NOT DETECTED NOT DETECTED Final   Klebsiella oxytoca NOT DETECTED NOT DETECTED Final   Klebsiella pneumoniae NOT DETECTED NOT DETECTED Final   Proteus species NOT DETECTED NOT DETECTED Final   Serratia marcescens NOT DETECTED NOT DETECTED Final   Haemophilus influenzae NOT DETECTED NOT DETECTED Final   Neisseria meningitidis NOT DETECTED NOT DETECTED Final   Pseudomonas aeruginosa NOT DETECTED NOT DETECTED Final   Candida albicans NOT DETECTED NOT DETECTED Final   Candida glabrata NOT DETECTED NOT DETECTED Final   Candida krusei NOT DETECTED NOT DETECTED Final   Candida parapsilosis NOT DETECTED NOT DETECTED Final   Candida tropicalis NOT DETECTED NOT DETECTED Final    Comment: Performed at [facility], [place]. [address], [place], [place] [address]  SARS Coronavirus 2 [facility] order, Performed in [facility] hospital lab)     Status: None   Collection Time: 06/04/18 11:25 AM  Result Value Ref Range Status   SARS Coronavirus 2 NEGATIVE NEGATIVE Final    Comment: (NOTE) If result is NEGATIVE SARS-CoV-2 target nucleic acids are NOT DETECTED. The SARS-CoV-2 RNA is generally detectable in upper and lower  respiratory specimens during the acute phase of infection. The lowest  concentration of SARS-CoV-2 viral copies this assay can detect is 250  copies / mL. A negative result does not preclude  SARS-CoV-2 infection  and should not be used as the sole basis for treatment or other  patient management decisions.  A negative result may occur with  improper specimen collection / handling, submission of specimen other  than nasopharyngeal swab, presence of viral mutation(s) within the  areas targeted by this assay, and inadequate number of viral copies  (<250 copies / mL). A negative result must be combined with clinical  observations, patient history, and epidemiological information. If result is POSITIVE SARS-CoV-2 target nucleic acids are DETECTED. The SARS-CoV-2 RNA is generally detectable in upper and lower  respiratory specimens dur ing the acute phase of infection.  Positive  results are indicative of active infection with SARS-CoV-2.  Clinical  correlation with patient history and other diagnostic information is  necessary to determine patient infection status.  Positive  results do  not rule out bacterial infection or co-infection with other viruses. If result is PRESUMPTIVE POSTIVE SARS-CoV-2 nucleic acids MAY BE PRESENT.   A presumptive positive result was obtained on the submitted specimen  and confirmed on repeat testing.  While 2019 novel coronavirus  (SARS-CoV-2) nucleic acids may be present in the submitted sample  additional confirmatory testing may be necessary for epidemiological  and / or clinical management purposes  to differentiate between  SARS-CoV-2 and other Sarbecovirus currently known to infect humans.  If clinically indicated additional testing with an alternate test  methodology [phone]) is advised. The SARS-CoV-2 RNA is generally  detectable in upper and lower respiratory sp ecimens during the acute  phase of infection. The expected result is Negative. Fact Sheet for Patients:  StrictlyIdeas.no Fact Sheet for Healthcare Providers: BankingDealers.co.za This test is not yet approved or cleared by the  [place] FDA and has been authorized for detection and/or diagnosis of SARS-CoV-2 by FDA under an Emergency Use Authorization (EUA).  This EUA will remain in effect (meaning this test can be used) for the duration of the COVID-19 declaration under Section 564(b)(1) of the Act, 21 U.S.C. section 360bbb-3(b)(1), unless the authorization is terminated or revoked sooner. Performed at [facility], [place] [address], [place], [place] [address]   Urine culture     Status: Abnormal   Collection Time: 06/04/18 11:42 AM  Result Value Ref Range Status   Specimen Description URINE, RANDOM  Final   Special Requests   Final    STERILE CONTAINER Performed at [facility], [place] [address] [place], [place] [address]    Culture 80,000 COLONIES/mL PROTEUS MIRABILIS (A)  Final   Report Status 06/06/2018 FINAL  Final   Organism ID, Bacteria PROTEUS MIRABILIS (A)  Final      Susceptibility   Proteus mirabilis - MIC*    AMPICILLIN <=2 SENSITIVE Sensitive     CEFAZOLIN <=4 SENSITIVE Sensitive     CEFTRIAXONE <=1 SENSITIVE Sensitive     CIPROFLOXACIN 2 INTERMEDIATE Intermediate     GENTAMICIN <=1 SENSITIVE Sensitive     IMIPENEM 2 SENSITIVE Sensitive     NITROFURANTOIN 128 RESISTANT Resistant     TRIMETH/SULFA <=20 SENSITIVE Sensitive     AMPICILLIN/SULBACTAM <=2 SENSITIVE Sensitive     PIP/TAZO <=4 SENSITIVE Sensitive     * 80,000 COLONIES/mL PROTEUS MIRABILIS  Blood Culture (routine x 2)     Status: None (Preliminary result)   Collection Time: 06/04/18 11:50 AM  Result Value Ref Range Status   Specimen Description BLOOD RIGHT FOREARM  Final   Special Requests   Final    BOTTLES DRAWN AEROBIC AND ANAEROBIC Blood Culture adequate volume   Culture   Final    NO GROWTH 2 DAYS Performed at [facility], [address], [place], [place] [address]    Report Status PENDING  Incomplete  Culture, blood (routine x 2)     Status: None (Preliminary result)   Collection Time: 06/05/18   9:28 AM  Result Value Ref Range Status   Specimen Description BLOOD RIGHT HAND  Final   Special Requests   Final    BOTTLES DRAWN AEROBIC ONLY Blood Culture adequate volume   Culture   Final    NO GROWTH 1 DAY Performed at [facility], [place] [address], [place], [place] [address]    Report Status PENDING  Incomplete  Culture, blood (routine x 2)     Status: None (  Preliminary result)   Collection Time: 06/05/18  9:40 AM  Result Value Ref Range Status   Specimen Description BLOOD LEFT ANTECUBITAL  Final   Special Requests   Final    BOTTLES DRAWN AEROBIC AND ANAEROBIC Blood Culture adequate volume   Culture   Final    NO GROWTH 1 DAY Performed at [facility], [address], [place], [place] [address]    Report Status PENDING  Incomplete         Radiology Studies: No results found.      Scheduled Meds: . acetaminophen  1,000 mg Oral QHS  . cholecalciferol  1,000 Units Oral Q breakfast  . docusate sodium  100 mg Oral BID  . dorzolamide-timolol  1 drop Both Eyes BID  . feeding supplement (ENSURE ENLIVE)  237 mL Oral QID  . ferrous sulfate  325 mg Oral Q breakfast  . fludrocortisone  0.1 mg Oral [id number]  . heparin  5,000 Units Subcutaneous Q8H  . loratadine  10 mg Oral Daily  . mirtazapine  7.5 mg Oral QHS  . polyethylene glycol  17 g Oral Daily  . potassium chloride  40 mEq Oral TID  . Travoprost (BAK Free)  1 drop Both Eyes QHS  . vitamin B-12  1,000 mcg Oral Daily   Continuous Infusions: . [START ON 06/07/2018] vancomycin       LOS: 2 days    Time spent: 35 minutes    Edwin Dada, MD [name] 06/06/2018, 4:28 PM     Please page through [place]:  [email or web address] Password [email or web address] If 7PM-7AM, please contact night-coverage

## 2018-06-07 ENCOUNTER — Inpatient Hospital Stay: Payer: Self-pay

## 2018-06-07 LAB — CBC
HCT: 36.1 % (ref 36.0–46.0)
Hemoglobin: 11.9 g/dL — ABNORMAL LOW (ref 12.0–15.0)
MCH: 29.9 pg (ref 26.0–34.0)
MCHC: 33 g/dL (ref 30.0–36.0)
MCV: 90.7 fL (ref 80.0–100.0)
Platelets: 205 10*3/uL (ref 150–400)
RBC: 3.98 MIL/uL (ref 3.87–5.11)
RDW: 12.9 % (ref 11.5–15.5)
WBC: 7.4 10*3/uL (ref 4.0–10.5)
nRBC: 0 % (ref 0.0–0.2)

## 2018-06-07 LAB — BASIC METABOLIC PANEL
Anion gap: 11 (ref 5–15)
BUN: 12 mg/dL (ref 8–23)
CO2: 19 mmol/L — ABNORMAL LOW (ref 22–32)
Calcium: 8.7 mg/dL — ABNORMAL LOW (ref 8.9–10.3)
Chloride: 114 mmol/L — ABNORMAL HIGH (ref 98–111)
Creatinine, Ser: 0.58 mg/dL (ref 0.44–1.00)
GFR calc Af Amer: >60 mL/min (ref >60)
GFR calc non Af Amer: >60 mL/min (ref >60)
Glucose, Bld: 110 mg/dL — ABNORMAL HIGH (ref 70–99)
Potassium: 2.9 mmol/L — ABNORMAL LOW (ref 3.5–5.1)
Sodium: 144 mmol/L (ref 135–145)

## 2018-06-07 LAB — CULTURE, BLOOD (ROUTINE X 2): Special Requests: ADEQUATE

## 2018-06-07 NOTE — Progress Notes (Signed)
Spoke with Junie Panning RN re PICC order.  Notified PICC will be re-evaluated 06/08/18 per Infectious Disease notes to wait until blood cultures are negative at least 48 hours.

## 2018-06-07 NOTE — Plan of Care (Signed)
  Problem: Education: Goal: Knowledge of General Education information will improve Description Including pain rating scale, medication(s)/side effects and non-pharmacologic comfort measures Outcome: Progressing Note:  POC reviewed with pt.   

## 2018-06-07 NOTE — Progress Notes (Signed)
PROGRESS NOTE    Taylor Roth  YQM:[id number] DOB: 1928-06-09 DOA: 06/04/2018 PCP: [name], MD      Brief Narrative:  Taylor Roth is a 85 y.o. F with mild dementia, SNF bound, blindness, wheelchair-bound about 2 yrs, CKD III baseline Cr 1.0, COPD, anxiety, protein calorie malnutrition, and chronic decubitus ulcers who presents with decreased mentation, fever and hypotension.  In ER, temp 101, BP 76/46, HR 102.  Give fluids and ceftriaxone and hospitalist service consulted for sepsis.  Urine turbid, CXR clear.  Suspected UTI initially.      Assessment & Plan:  Sepsis from likely MRSA bacteremia Presented with fever, tachycardia, hypotension.  Responded to fluids.  1/2 blood cultures positive for MRSA.  First repeat cultures negative at 48 hours, second set still negative too.  No further fever.  TTE negative for vegetation.   -Continue vancomycin for 2 weeks -Daily BMP -Consult ID, apprecaite recommendations   Acute metabolic encephalopathy superimposed on chronic dementia Improving, able to hold a conversation today, express needs.  At baseline, patient is oriented to place, situation, not year. -Hold Seroquel, BZD, Nroco  CKD III No change  Asthma/COPD No active disease  Severe protein calorie malnutrition -Continue Ensure  Other medications -Continue fluorinef, unclear indication -Continue mirtazapine -Continue loratadine -Continue lactulose, unclear indication  Normocytic anemia -Continue iron  Pressure injuries, stage II sacral ulcer stage: Left flank ulcer, both POA       MDM and disposition: The labs and imaging reports above were reviewed and summarized in this note.  Medication management as above.  The patient was admitted with septic shock, hypotension from MRSA bacteremia.  Cultures are now resulted, and antibiotics are narrowed to vancomycin.  This is an acute illness that poses a threat to life, limb, and bodily function as well  as abrupt neurological change.  An extensive number of diagnoses or treatment options were managed.    Acute metabolic encephalopathy is resolving, and her repeat cultures are negative, with no evidence of vegetation on the valve.  We will place PICC line tomorrow morning, likely back to SNF tomorrow.     DVT prophylaxis: Heparin Code Status: DO NOT RESUSCITATE Family Communication: Daughter by phone    Consultants:   ID  Procedures:   Echo pending  Antimicrobials:   Ceftriaxone 4/22 >> 4/23  Vancomycin 4/23 >>    Subjective: Alert, attention normal.  Denies chest pain, dyspnea, cough, sputum.  Denies abdominal pain, dysuria, diarrhea, fever, vomiting.  Objective: Vitals:   06/06/18 2112 06/07/18 0425 06/07/18 0425 06/07/18 1509  BP: (!) 154/88 (!) 149/102 127/81 (!) 150/79  Pulse: 75 70  71  Resp: 16  18 18   Temp: 99.3 F (37.4 C) (!) 97.5 F (36.4 C)  97.6 F (36.4 C)  TempSrc: Oral Oral  Oral  SpO2: 95% 99%  99%  Weight:      Height:        Intake/Output Summary (Last 24 hours) at 06/07/2018 1652 Last data filed at 06/07/2018 0719 Gross per 24 hour  Intake --  Output 1450 ml  Net -1450 ml   Filed Weights   06/04/18 1104 06/04/18 1430  Weight: 37 kg 39.7 kg    Examination: General appearance: Thin elderly female, interactive, no acute distress HEENT: No nasal deformity, discharge, or epistaxis, although her nose is raw from rubbing.  Conjunctival normal, lids normal and but reddened.  Edentulous, lips normal, no oral lesions, oropharynx moist, hearing diminished. Skin: Warm and dry without suspicious  rashes or lesions. Cardiac: RRR, no murmurs, JVP normal, no lower extremity edema Respiratory: Normal respiratory rate and rhythm, lungs clear without rales or wheezes abdomen: Soft, no tenderness to palpation, no rebound or guarding.   MSK: Diffuse severe loss of subcutaneous muscle mass and fat, thenar wasting Neuro: Blind.  Responds to questions,  interactive, not oriented to place, but mentation improved.  Moves upper extremities with 4/5 strength, symmetric coordination.  Speech fluent. Psych: Attention normal, affect blunted, judgment and insight appear slightly impaired    Data Reviewed: I have personally reviewed following labs and imaging studies:  CBC: Recent Labs  Lab 06/04/18 1121 06/05/18 0320 06/06/18 0646 06/07/18 0317  WBC 10.6* 10.3 8.6 7.4  NEUTROABS 8.6*  --   --   --   HGB 11.4* 10.8* 11.3* 11.9*  HCT 36.8 33.8* 34.9* 36.1  MCV 97.6 95.5 93.1 90.7  PLT 163 140* 167 166   Basic Metabolic Panel: Recent Labs  Lab 06/04/18 1121 06/05/18 0320 06/06/18 0646 06/07/18 0317  NA 144 145 145 144  K 3.8 2.9* 2.4* 2.9*  CL 111 116* 111 114*  CO2 23 21* 21* 19*  GLUCOSE 119* 105* 100* 110*  BUN 25* 15 15 12   CREATININE 1.16* 0.82 0.67 0.58  CALCIUM 8.9 8.2* 8.5* 8.7*   GFR: Estimated Creatinine Clearance: 31.1 mL/min (by C-G formula based on SCr of 0.58 mg/dL). Liver Function Tests: Recent Labs  Lab 06/04/18 1121 06/05/18 0320  AST 22 22  ALT 16 16  ALKPHOS 66 59  BILITOT 0.5 0.5  PROT 5.9* 5.7*  ALBUMIN 2.8* 2.5*   No results for input(s): LIPASE, AMYLASE in the last 168 hours. No results for input(s): AMMONIA in the last 168 hours. Coagulation Profile: No results for input(s): INR, PROTIME in the last 168 hours. Cardiac Enzymes: Recent Labs  Lab 06/04/18 1121  TROPONINI 0.04*   BNP (last 3 results) No results for input(s): PROBNP in the last 8760 hours. HbA1C: No results for input(s): HGBA1C in the last 72 hours. CBG: No results for input(s): GLUCAP in the last 168 hours. Lipid Profile: No results for input(s): CHOL, HDL, LDLCALC, TRIG, CHOLHDL, LDLDIRECT in the last 72 hours. Thyroid Function Tests: No results for input(s): TSH, T4TOTAL, FREET4, T3FREE, THYROIDAB in the last 72 hours. Anemia Panel: No results for input(s): VITAMINB12, FOLATE, FERRITIN, TIBC, IRON, RETICCTPCT in the  last 72 hours. Urine analysis:    Component Value Date/Time   COLORURINE AMBER (A) 06/04/2018 1142   APPEARANCEUR TURBID (A) 06/04/2018 1142   LABSPEC 1.019 06/04/2018 1142   PHURINE 6.0 06/04/2018 1142   GLUCOSEU NEGATIVE 06/04/2018 1142   HGBUR SMALL (A) 06/04/2018 1142   BILIRUBINUR NEGATIVE 06/04/2018 1142   BILIRUBINUR neg 06/20/2013 1736   KETONESUR NEGATIVE 06/04/2018 1142   PROTEINUR 100 (A) 06/04/2018 1142   UROBILINOGEN 0.2 12/25/2014 1620   NITRITE NEGATIVE 06/04/2018 1142   LEUKOCYTESUR MODERATE (A) 06/04/2018 1142   Sepsis Labs: @LABRCNTIP (procalcitonin:4,lacticacidven:4)  ) Recent Results (from the past 240 hour(s))  Blood Culture (routine x 2)     Status: Abnormal   Collection Time: 06/04/18 11:22 AM  Result Value Ref Range Status   Specimen Description BLOOD SITE NOT SPECIFIED  Final   Special Requests   Final    BOTTLES DRAWN AEROBIC AND ANAEROBIC Blood Culture adequate volume   Culture  Setup Time   Final    GRAM POSITIVE COCCI ANAEROBIC BOTTLE ONLY CRITICAL RESULT CALLED TO, READ BACK BY AND VERIFIED WITH: Cristopher Estimable  PharmD 9:20 06/05/18 ([email or web address])    Culture (A)  Final    METHICILLIN RESISTANT STAPHYLOCOCCUS AUREUS AEROCOCCUS SPECIES Standardized susceptibility testing for this organism is not available. Performed at [facility], [place] [address], [name], [place] [address]    Report Status 06/07/2018 FINAL  Final   Organism ID, Bacteria METHICILLIN RESISTANT STAPHYLOCOCCUS AUREUS  Final      Susceptibility   Methicillin resistant staphylococcus aureus - MIC*    CIPROFLOXACIN >=8 RESISTANT Resistant     ERYTHROMYCIN >=8 RESISTANT Resistant     GENTAMICIN <=0.5 SENSITIVE Sensitive     OXACILLIN >=4 RESISTANT Resistant     TETRACYCLINE <=1 SENSITIVE Sensitive     VANCOMYCIN <=0.5 SENSITIVE Sensitive     TRIMETH/SULFA >=320 RESISTANT Resistant     CLINDAMYCIN <=0.25 SENSITIVE Sensitive     RIFAMPIN <=0.5 SENSITIVE Sensitive     Inducible  Clindamycin NEGATIVE Sensitive     * METHICILLIN RESISTANT STAPHYLOCOCCUS AUREUS  Blood Culture ID Panel (Reflexed)     Status: Abnormal   Collection Time: 06/04/18 11:22 AM  Result Value Ref Range Status   Enterococcus species NOT DETECTED NOT DETECTED Final   Listeria monocytogenes NOT DETECTED NOT DETECTED Final   Staphylococcus species DETECTED (A) NOT DETECTED Final    Comment: CRITICAL RESULT CALLED TO, READ BACK BY AND VERIFIED WITH: Cristopher Estimable PharmD 9:20 06/05/18 ([email or web address])    Staphylococcus aureus (BCID) DETECTED (A) NOT DETECTED Final    Comment: Methicillin (oxacillin)-resistant Staphylococcus aureus (MRSA). MRSA is predictably resistant to beta-lactam antibiotics (except ceftaroline). Preferred therapy is vancomycin unless clinically contraindicated. Patient requires contact precautions if  hospitalized. CRITICAL RESULT CALLED TO, READ BACK BY AND VERIFIED WITH: Cristopher Estimable PharmD 9:20 06/05/18 ([email or web address])    Methicillin resistance DETECTED (A) NOT DETECTED Final    Comment: CRITICAL RESULT CALLED TO, READ BACK BY AND VERIFIED WITH: Cristopher Estimable PharmD 9:20 06/05/18 ([email or web address])    Streptococcus species NOT DETECTED NOT DETECTED Final   Streptococcus agalactiae NOT DETECTED NOT DETECTED Final   Streptococcus pneumoniae NOT DETECTED NOT DETECTED Final   Streptococcus pyogenes NOT DETECTED NOT DETECTED Final   Acinetobacter baumannii NOT DETECTED NOT DETECTED Final   Enterobacteriaceae species NOT DETECTED NOT DETECTED Final   Enterobacter cloacae complex NOT DETECTED NOT DETECTED Final   Escherichia coli NOT DETECTED NOT DETECTED Final   Klebsiella oxytoca NOT DETECTED NOT DETECTED Final   Klebsiella pneumoniae NOT DETECTED NOT DETECTED Final   Proteus species NOT DETECTED NOT DETECTED Final   Serratia marcescens NOT DETECTED NOT DETECTED Final   Haemophilus influenzae NOT DETECTED NOT DETECTED Final   Neisseria meningitidis NOT DETECTED NOT DETECTED Final   Pseudomonas  aeruginosa NOT DETECTED NOT DETECTED Final   Candida albicans NOT DETECTED NOT DETECTED Final   Candida glabrata NOT DETECTED NOT DETECTED Final   Candida krusei NOT DETECTED NOT DETECTED Final   Candida parapsilosis NOT DETECTED NOT DETECTED Final   Candida tropicalis NOT DETECTED NOT DETECTED Final    Comment: Performed at [facility], [place]. [address], [place],  [address]  SARS Coronavirus 2 [facility] order, Performed in [facility] hospital lab)     Status: None   Collection Time: 06/04/18 11:25 AM  Result Value Ref Range Status   SARS Coronavirus 2 NEGATIVE NEGATIVE Final    Comment: (NOTE) If result is NEGATIVE SARS-CoV-2 target nucleic acids are NOT DETECTED. The SARS-CoV-2 RNA is generally detectable in upper and lower  respiratory specimens during the acute phase of infection.  The lowest  concentration of SARS-CoV-2 viral copies this assay can detect is 250  copies / mL. A negative result does not preclude SARS-CoV-2 infection  and should not be used as the sole basis for treatment or other  patient management decisions.  A negative result may occur with  improper specimen collection / handling, submission of specimen other  than nasopharyngeal swab, presence of viral mutation(s) within the  areas targeted by this assay, and inadequate number of viral copies  (<250 copies / mL). A negative result must be combined with clinical  observations, patient history, and epidemiological information. If result is POSITIVE SARS-CoV-2 target nucleic acids are DETECTED. The SARS-CoV-2 RNA is generally detectable in upper and lower  respiratory specimens dur ing the acute phase of infection.  Positive  results are indicative of active infection with SARS-CoV-2.  Clinical  correlation with patient history and other diagnostic information is  necessary to determine patient infection status.  Positive results do  not rule out bacterial infection or co-infection with other  viruses. If result is PRESUMPTIVE POSTIVE SARS-CoV-2 nucleic acids MAY BE PRESENT.   A presumptive positive result was obtained on the submitted specimen  and confirmed on repeat testing.  While 2019 novel coronavirus  (SARS-CoV-2) nucleic acids may be present in the submitted sample  additional confirmatory testing may be necessary for epidemiological  and / or clinical management purposes  to differentiate between  SARS-CoV-2 and other Sarbecovirus currently known to infect humans.  If clinically indicated additional testing with an alternate test  methodology [phone]) is advised. The SARS-CoV-2 RNA is generally  detectable in upper and lower respiratory sp ecimens during the acute  phase of infection. The expected result is Negative. Fact Sheet for Patients:  StrictlyIdeas.no Fact Sheet for Healthcare Providers: BankingDealers.co.za This test is not yet approved or cleared by the [place] FDA and has been authorized for detection and/or diagnosis of SARS-CoV-2 by FDA under an Emergency Use Authorization (EUA).  This EUA will remain in effect (meaning this test can be used) for the duration of the COVID-19 declaration under Section 564(b)(1) of the Act, 21 U.S.C. section 360bbb-3(b)(1), unless the authorization is terminated or revoked sooner. Performed at [facility], [place] [address], [place], [place] [address]   Urine culture     Status: Abnormal   Collection Time: 06/04/18 11:42 AM  Result Value Ref Range Status   Specimen Description URINE, RANDOM  Final   Special Requests   Final    STERILE CONTAINER Performed at [facility], [place] [address], [place], [place] [address]    Culture 80,000 COLONIES/mL PROTEUS MIRABILIS (A)  Final   Report Status 06/06/2018 FINAL  Final   Organism ID, Bacteria PROTEUS MIRABILIS (A)  Final      Susceptibility   Proteus mirabilis - MIC*    AMPICILLIN <=2 SENSITIVE Sensitive      CEFAZOLIN <=4 SENSITIVE Sensitive     CEFTRIAXONE <=1 SENSITIVE Sensitive     CIPROFLOXACIN 2 INTERMEDIATE Intermediate     GENTAMICIN <=1 SENSITIVE Sensitive     IMIPENEM 2 SENSITIVE Sensitive     NITROFURANTOIN 128 RESISTANT Resistant     TRIMETH/SULFA <=20 SENSITIVE Sensitive     AMPICILLIN/SULBACTAM <=2 SENSITIVE Sensitive     PIP/TAZO <=4 SENSITIVE Sensitive     * 80,000 COLONIES/mL PROTEUS MIRABILIS  Blood Culture (routine x 2)     Status: None (Preliminary result)   Collection Time: 06/04/18 11:50 AM  Result Value Ref  Range Status   Specimen Description BLOOD RIGHT FOREARM  Final   Special Requests   Final    BOTTLES DRAWN AEROBIC AND ANAEROBIC Blood Culture adequate volume   Culture   Final    NO GROWTH 3 DAYS Performed at [facility], [address], [place], [place] [address]    Report Status PENDING  Incomplete  Culture, blood (routine x 2)     Status: None (Preliminary result)   Collection Time: 06/05/18  9:28 AM  Result Value Ref Range Status   Specimen Description BLOOD RIGHT HAND  Final   Special Requests   Final    BOTTLES DRAWN AEROBIC ONLY Blood Culture adequate volume   Culture   Final    NO GROWTH 2 DAYS Performed at [facility], [place] [address], [place], [place] [address]    Report Status PENDING  Incomplete  Culture, blood (routine x 2)     Status: None (Preliminary result)   Collection Time: 06/05/18  9:40 AM  Result Value Ref Range Status   Specimen Description BLOOD LEFT ANTECUBITAL  Final   Special Requests   Final    BOTTLES DRAWN AEROBIC AND ANAEROBIC Blood Culture adequate volume   Culture   Final    NO GROWTH 2 DAYS Performed at [facility], [place] [address], [place], [place] [address]    Report Status PENDING  Incomplete  Culture, blood (routine x 2)     Status: None (Preliminary result)   Collection Time: 06/06/18  6:45 AM  Result Value Ref Range Status   Specimen Description BLOOD RIGHT ARM  Final   Special Requests    Final    BOTTLES DRAWN AEROBIC AND ANAEROBIC Blood Culture adequate volume   Culture   Final    NO GROWTH 1 DAY Performed at [facility], [place] [address], [place], [place] [address]    Report Status PENDING  Incomplete  Culture, blood (routine x 2)     Status: None (Preliminary result)   Collection Time: 06/06/18  7:00 AM  Result Value Ref Range Status   Specimen Description BLOOD LEFT ANTECUBITAL  Final   Special Requests   Final    BOTTLES DRAWN AEROBIC AND ANAEROBIC Blood Culture adequate volume   Culture   Final    NO GROWTH 1 DAY Performed at [facility], [place] [address], [place],  [address]    Report Status PENDING  Incomplete         Radiology Studies: [place] Ekg Site Rite  Result Date: 06/07/2018 If Site Rite image not attached, placement could not be confirmed due to current cardiac rhythm.       Scheduled Meds:  acetaminophen  1,000 mg Oral QHS   cholecalciferol  1,000 Units Oral Q breakfast   docusate sodium  100 mg Oral BID   dorzolamide-timolol  1 drop Both Eyes BID   feeding supplement (ENSURE ENLIVE)  237 mL Oral QID   ferrous sulfate  325 mg Oral Q breakfast   fludrocortisone  0.1 mg Oral [id number]   heparin  5,000 Units Subcutaneous Q8H   loratadine  10 mg Oral Daily   mirtazapine  7.5 mg Oral QHS   polyethylene glycol  17 g Oral Daily   Travoprost (BAK Free)  1 drop Both Eyes QHS   vitamin B-12  1,000 mcg Oral Daily   Continuous Infusions:  vancomycin 750 mg (06/07/18 1020)     LOS: 3 days  Time spent: 25 minutes    Edwin Dada, MD [name] 06/07/2018, 4:52 PM     Please page through [place]:  [email or web address] Password [email or web address] If 7PM-7AM, please contact night-coverage

## 2018-06-07 NOTE — Progress Notes (Signed)
This encounter was created in error - please disregard.

## 2018-06-08 DIAGNOSIS — G9340 Encephalopathy, unspecified: Secondary | ICD-10-CM | POA: Diagnosis not present

## 2018-06-08 DIAGNOSIS — J449 Chronic obstructive pulmonary disease, unspecified: Secondary | ICD-10-CM | POA: Diagnosis not present

## 2018-06-08 DIAGNOSIS — M6281 Muscle weakness (generalized): Secondary | ICD-10-CM | POA: Diagnosis not present

## 2018-06-08 DIAGNOSIS — F0391 Unspecified dementia with behavioral disturbance: Secondary | ICD-10-CM | POA: Diagnosis not present

## 2018-06-08 DIAGNOSIS — F419 Anxiety disorder, unspecified: Secondary | ICD-10-CM | POA: Diagnosis not present

## 2018-06-08 DIAGNOSIS — I951 Orthostatic hypotension: Secondary | ICD-10-CM | POA: Diagnosis not present

## 2018-06-08 DIAGNOSIS — A4102 Sepsis due to Methicillin resistant Staphylococcus aureus: Secondary | ICD-10-CM | POA: Diagnosis not present

## 2018-06-08 DIAGNOSIS — Z792 Long term (current) use of antibiotics: Secondary | ICD-10-CM | POA: Diagnosis not present

## 2018-06-08 DIAGNOSIS — Z7401 Bed confinement status: Secondary | ICD-10-CM | POA: Diagnosis not present

## 2018-06-08 DIAGNOSIS — R2681 Unsteadiness on feet: Secondary | ICD-10-CM | POA: Diagnosis not present

## 2018-06-08 DIAGNOSIS — G9341 Metabolic encephalopathy: Secondary | ICD-10-CM | POA: Diagnosis not present

## 2018-06-08 DIAGNOSIS — D638 Anemia in other chronic diseases classified elsewhere: Secondary | ICD-10-CM | POA: Diagnosis not present

## 2018-06-08 DIAGNOSIS — R1312 Dysphagia, oropharyngeal phase: Secondary | ICD-10-CM | POA: Diagnosis not present

## 2018-06-08 DIAGNOSIS — F0281 Dementia in other diseases classified elsewhere with behavioral disturbance: Secondary | ICD-10-CM | POA: Diagnosis not present

## 2018-06-08 DIAGNOSIS — R7881 Bacteremia: Secondary | ICD-10-CM | POA: Diagnosis not present

## 2018-06-08 DIAGNOSIS — R5381 Other malaise: Secondary | ICD-10-CM | POA: Diagnosis not present

## 2018-06-08 DIAGNOSIS — J3089 Other allergic rhinitis: Secondary | ICD-10-CM | POA: Diagnosis not present

## 2018-06-08 DIAGNOSIS — E860 Dehydration: Secondary | ICD-10-CM | POA: Diagnosis not present

## 2018-06-08 DIAGNOSIS — F39 Unspecified mood [affective] disorder: Secondary | ICD-10-CM | POA: Diagnosis not present

## 2018-06-08 DIAGNOSIS — D649 Anemia, unspecified: Secondary | ICD-10-CM | POA: Diagnosis not present

## 2018-06-08 DIAGNOSIS — I2510 Atherosclerotic heart disease of native coronary artery without angina pectoris: Secondary | ICD-10-CM | POA: Diagnosis not present

## 2018-06-08 DIAGNOSIS — N183 Chronic kidney disease, stage 3 (moderate): Secondary | ICD-10-CM | POA: Diagnosis not present

## 2018-06-08 DIAGNOSIS — F329 Major depressive disorder, single episode, unspecified: Secondary | ICD-10-CM | POA: Diagnosis not present

## 2018-06-08 DIAGNOSIS — I10 Essential (primary) hypertension: Secondary | ICD-10-CM | POA: Diagnosis not present

## 2018-06-08 DIAGNOSIS — E7800 Pure hypercholesterolemia, unspecified: Secondary | ICD-10-CM | POA: Diagnosis not present

## 2018-06-08 DIAGNOSIS — Z66 Do not resuscitate: Secondary | ICD-10-CM | POA: Diagnosis not present

## 2018-06-08 DIAGNOSIS — Z452 Encounter for adjustment and management of vascular access device: Secondary | ICD-10-CM | POA: Diagnosis not present

## 2018-06-08 DIAGNOSIS — Z5181 Encounter for therapeutic drug level monitoring: Secondary | ICD-10-CM | POA: Diagnosis not present

## 2018-06-08 DIAGNOSIS — Z20828 Contact with and (suspected) exposure to other viral communicable diseases: Secondary | ICD-10-CM | POA: Diagnosis not present

## 2018-06-08 DIAGNOSIS — E43 Unspecified severe protein-calorie malnutrition: Secondary | ICD-10-CM | POA: Diagnosis not present

## 2018-06-08 DIAGNOSIS — Z9181 History of falling: Secondary | ICD-10-CM | POA: Diagnosis not present

## 2018-06-08 DIAGNOSIS — M2550 Pain in unspecified joint: Secondary | ICD-10-CM | POA: Diagnosis not present

## 2018-06-08 DIAGNOSIS — K219 Gastro-esophageal reflux disease without esophagitis: Secondary | ICD-10-CM | POA: Diagnosis not present

## 2018-06-08 DIAGNOSIS — R6521 Severe sepsis with septic shock: Secondary | ICD-10-CM | POA: Diagnosis not present

## 2018-06-08 DIAGNOSIS — Z1159 Encounter for screening for other viral diseases: Secondary | ICD-10-CM | POA: Diagnosis not present

## 2018-06-08 DIAGNOSIS — G301 Alzheimer's disease with late onset: Secondary | ICD-10-CM | POA: Diagnosis not present

## 2018-06-08 LAB — CBC
HCT: 38.8 % (ref 36.0–46.0)
Hemoglobin: 12.8 g/dL (ref 12.0–15.0)
MCH: 29.8 pg (ref 26.0–34.0)
MCHC: 33 g/dL (ref 30.0–36.0)
MCV: 90.2 fL (ref 80.0–100.0)
Platelets: 200 10*3/uL (ref 150–400)
RBC: 4.3 MIL/uL (ref 3.87–5.11)
RDW: 13.1 % (ref 11.5–15.5)
WBC: 7.6 10*3/uL (ref 4.0–10.5)
nRBC: 0 % (ref 0.0–0.2)

## 2018-06-08 LAB — BASIC METABOLIC PANEL
Anion gap: 15 (ref 5–15)
BUN: 11 mg/dL (ref 8–23)
CO2: 21 mmol/L — ABNORMAL LOW (ref 22–32)
Calcium: 9 mg/dL (ref 8.9–10.3)
Chloride: 106 mmol/L (ref 98–111)
Creatinine, Ser: 0.7 mg/dL (ref 0.44–1.00)
GFR calc Af Amer: >60 mL/min (ref >60)
GFR calc non Af Amer: >60 mL/min (ref >60)
Glucose, Bld: 89 mg/dL (ref 70–99)
Potassium: 2.4 mmol/L — CL (ref 3.5–5.1)
Sodium: 142 mmol/L (ref 135–145)

## 2018-06-08 LAB — MAGNESIUM: Magnesium: 1.6 mg/dL — ABNORMAL LOW (ref 1.7–2.4)

## 2018-06-08 LAB — POTASSIUM: Potassium: 2.7 mmol/L — CL (ref 3.5–5.1)

## 2018-06-08 MED ORDER — SODIUM CHLORIDE 0.9% FLUSH: 10.0000 mL | INTRAVENOUS | Status: DC | PRN

## 2018-06-08 MED ORDER — POTASSIUM CHLORIDE CRYS ER 20 MEQ PO TBCR: 20.0000 meq | EXTENDED_RELEASE_TABLET | Freq: Every day | ORAL | 0 refills | Status: DC

## 2018-06-08 MED ORDER — VANCOMYCIN IV (FOR PTA / DISCHARGE USE ONLY): 750.0000 mg | INTRAVENOUS | 0 refills | Status: AC

## 2018-06-08 MED ORDER — POTASSIUM CHLORIDE CRYS ER 10 MEQ PO TBCR
10.0000 meq | EXTENDED_RELEASE_TABLET | Freq: Three times a day (TID) | ORAL | Status: DC
  Administered 2018-06-08: 10 meq via ORAL
  Filled 2018-06-08: qty 1

## 2018-06-08 MED ORDER — POTASSIUM CHLORIDE 10 MEQ/50ML IV SOLN
10.0000 meq | INTRAVENOUS | Status: AC
  Administered 2018-06-08 (×2): 10 meq via INTRAVENOUS
  Filled 2018-06-08 (×2): qty 50

## 2018-06-08 MED ORDER — POTASSIUM CHLORIDE 10 MEQ/100ML IV SOLN
10.0000 meq | INTRAVENOUS | Status: AC
  Administered 2018-06-08 (×4): 10 meq via INTRAVENOUS
  Filled 2018-06-08 (×4): qty 100

## 2018-06-08 MED ORDER — HEPARIN SOD (PORK) LOCK FLUSH 100 UNIT/ML IV SOLN
250.0000 [IU] | INTRAVENOUS | Status: AC | PRN
  Administered 2018-06-08: 250 [IU]

## 2018-06-08 MED ORDER — MAGNESIUM SULFATE 2 GM/50ML IV SOLN
2.0000 g | Freq: Once | INTRAVENOUS | Status: AC
  Administered 2018-06-08: 2 g via INTRAVENOUS
  Filled 2018-06-08: qty 50

## 2018-06-08 MED ORDER — POTASSIUM CHLORIDE CRYS ER 20 MEQ PO TBCR
40.0000 meq | EXTENDED_RELEASE_TABLET | Freq: Once | ORAL | Status: AC
  Administered 2018-06-08: 40 meq via ORAL
  Filled 2018-06-08: qty 2

## 2018-06-08 NOTE — Progress Notes (Deleted)
Physician Discharge Summary  Taylor Roth ZOX:[id number] DOB: 1928-09-17 DOA: 06/04/2018  PCP: [name], MD  Admit date: 06/04/2018 Discharge date: 06/08/2018  Admitted From: SNF  Disposition:  SNF   Recommendations for Outpatient Follow-up:  1. Continue IV Vancomycin for 14 days, ending May 7 2. Please obtain BMP and vancomycin trough every Monday and Thursday until May 7 3. Please obtain CBC every Monday until May 7 4. Please obtain CRP and ESR On May 7 5. Remove PICC after last Vancomycin dose 6. Continue oral potassium supplement and repeat BMP/K on Monday, Thursday 7. Stop Seroquel     Home Health: N/A  Equipment/Devices: N/A  Discharge Condition: Poor  CODE STATUS: DO NOT RESUSCITATE Diet recommendation: Regular  Brief/Interim Summary: Taylor Roth is a 85 y.o. F with mild dementia, SNF bound, blindness, wheelchair-bound about 2 yrs, CKD III baseline Cr 1.0, COPD, anxiety, protein calorie malnutrition, and chronic decubitus ulcers who presents with decreased mentation, fever and hypotension.  In ER, temp 101, BP 76/46, HR 102.  Give fluids and ceftriaxone and hospitalist service consulted for sepsis.  Urine turbid, CXR clear.  Suspected UTI initially.     PRINCIPAL HOSPITAL DIAGNOSIS: Sepsis from MRSA bacteremia    Discharge Diagnoses:   Sepsis from MRSA bacteremia Presented with fever, tachycardia, hypotension.  Improved with fluids.  Ovenright on admission, 1/2 blood cultures positive for MRSA.  Repeat blood cultures negative.  No further fever.  TTE negative for vegetation.  Infectious disease were consulted, no satellite foci of infection noted.  ID recommended 2 weeks IV Vancomycin for SA bacteremia.    PICC line placed after repeat cultures negative 72 hours.      Acute metabolic encephalopathy superimposed on chronic dementia Initially mentation poor, not oriented to self, not responding to stimuli.  With fluids, vancomycin, mentation  improved.  Able to hold a conversation, express needs, although blind, very hard of hearing.   CKD III No change  Asthma/COPD No active disease  Severe protein calorie malnutrition  Normocytic anemia Stable relative to baseline  Pressure injuries, stage II sacral ulcer stage: Left flank ulcer, both POA           Discharge Instructions  Discharge Instructions    Diet general   Complete by:  As directed    Discharge instructions   Complete by:  As directed    Continue vancomycin IV for 14 days total, ending May 7 Obtain BMP every Monday and Thursday Other labs as outlined in discharge summary  Obtain BMP Tomorrow and Thursday and continue oral potassium supplementation  STOP Seroquel for now Start acetaminophen 1000 mg nightly If needed, restart Seroquel at lower dose, 100 mg nightly  STOP clonazepam  Start ensure, four times daily   Home infusion instructions   Complete by:  As directed    Instructions:  Flushing of vascular access device: 0.9% NaCl pre/post medication administration and prn patency; Heparin 100 u/ml, 74m for implanted ports and Heparin 10u/ml, 514mfor all other central venous catheters.   Increase activity slowly   Complete by:  As directed      Allergies as of 06/08/2018      Reactions   Amoxicillin    Per daughter   Ampicillin    Per daughter   Depakote [valproic Acid]    DOES NOT WANT HER TO TAKE IT==Makes her hair fall out= per daughter who is poa   Macrobid [nitrofurantoin Macrocrystal] Rash   Per MAR   Penicillins  Per daughter   Azithromycin Other (See Comments)   Per MAR   Doxycycline Other (See Comments)   Per MAR   Escitalopram Oxalate Other (See Comments)   Per MAR   Fioricet [butalbital-apap-caffeine] Other (See Comments)   Drunk. Per MAR   Latex Other (See Comments)   Per MAR   Levofloxacin Other (See Comments)   Per MAR   Nsaids Other (See Comments)   This is not an allergy. The patient was told by Dr.  Rockne Menghini to avoid NSAIDs and stop Vicoprofen in to 2014 to avoid nephrotoxicity. Per MAR.   Oxycodone Other (See Comments)   reaction to synthetic codeine Per MAR   Restasis [cyclosporine] Other (See Comments)   Per MAR   Sulfa Antibiotics Other (See Comments)   Per MAR   Biaxin [clarithromycin] Rash   With burning sensation Per MAR      Medication List    STOP taking these medications   clonazePAM 0.5 MG tablet Commonly known as:  KLONOPIN   HYDROcodone-acetaminophen 5-325 MG tablet Commonly known as:  NORCO/VICODIN   QUEtiapine 100 MG tablet Commonly known as:  SEROQUEL   QUEtiapine 300 MG tablet Commonly known as:  SEROQUEL     TAKE these medications   acetaminophen 325 MG tablet Commonly known as:  TYLENOL Take 650 mg by mouth every 6 (six) hours as needed for headache.   albuterol 108 (90 Base) MCG/ACT inhaler Commonly known as:  VENTOLIN HFA Inhale 1 puff into the lungs every 6 (six) hours as needed for shortness of breath.   Biotin 5000 MCG Caps Take 1 capsule by mouth daily.   Claritin 10 MG tablet Generic drug:  loratadine Take 10 mg by mouth daily.   Cranberry 450 MG Tabs Take 1 tablet by mouth 2 (two) times daily.   docusate sodium 100 MG capsule Commonly known as:  COLACE Take 100 mg by mouth 2 (two) times daily.   dorzolamide-timolol 22.3-6.8 MG/ML ophthalmic solution Commonly known as:  COSOPT Place 1 drop into both eyes 2 (two) times daily.   Ensure Take 237 mLs by mouth 4 (four) times daily. D/t weight loss   ferrous sulfate 325 (65 FE) MG tablet Take 325 mg by mouth daily.   fludrocortisone 0.1 MG tablet Commonly known as:  FLORINEF Take 0.1 mg by mouth every morning. for orthostatic hypotension   lactulose 10 GM/15ML solution Commonly known as:  CHRONULAC Take 15 mLs (10 g total) by mouth daily as needed for severe constipation.   mirtazapine 15 MG tablet Commonly known as:  REMERON Take 7.5 mg by mouth at bedtime.    nitroGLYCERIN 0.4 MG SL tablet Commonly known as:  NITROSTAT Place 0.4 mg under the tongue every 5 (five) minutes as needed for chest pain. May use 3 times   polyethylene glycol 17 g packet Commonly known as:  MIRALAX / GLYCOLAX Take 17 g by mouth daily.   potassium chloride SA 20 MEQ tablet Commonly known as:  K-DUR Take 1 tablet (20 mEq total) by mouth daily.   senna 8.6 MG tablet Commonly known as:  SENOKOT Take 2 tablets by mouth 2 (two) times daily as needed for constipation.   Systane 0.4-0.3 % Soln Generic drug:  Polyethyl Glycol-Propyl Glycol Place 2 drops into both eyes every 2 (two) hours as needed (dry eyes).   travoprost (benzalkonium) 0.004 % ophthalmic solution Commonly known as:  TRAVATAN Place 1 drop into both eyes at bedtime.   vancomycin  IVPB Inject 750 mg  into the vein every other day for 10 days. Indication:  MRSA bacteremia Last Day of Therapy:  06/19/18 Labs - Sunday/Monday:  CBC/D, BMP, and vancomycin trough. Labs - Thursday:  BMP and vancomycin trough Labs - Every other week:  ESR and CRP Start taking on:  June 09, 2018   vitamin B-12 1000 MCG tablet Commonly known as:  CYANOCOBALAMIN Take 1,500 mcg by mouth daily. gummies   Vitamin D 1000 units capsule Take 1,000 Units by mouth daily with breakfast.            Home Infusion Instuctions  (From admission, onward)         Start     Ordered   06/08/18 0000  Home infusion instructions    Question:  Instructions  Answer:  Flushing of vascular access device: 0.9% NaCl pre/post medication administration and prn patency; Heparin 100 u/ml, 6m for implanted ports and Heparin 10u/ml, 532mfor all other central venous catheters.   06/08/18 1114         Contact information for after-discharge care    Destination    [facility] Preferred SNF .   Service:  Skilled Nursing Contact information: [phone]            Allergies  Allergen Reactions  . Amoxicillin     Per daughter  . Ampicillin     Per daughter  . Depakote [Valproic Acid]     DOES NOT WANT HER TO TAKE IT==Makes her hair fall out= per daughter who is poa  . Macrobid [Nitrofurantoin Macrocrystal] Rash    Per MAR  . Penicillins     Per daughter  . Azithromycin Other (See Comments)    Per MAR  . Doxycycline Other (See Comments)    Per MAR  . Escitalopram Oxalate Other (See Comments)    Per MAR  . Fioricet [Butalbital-Apap-Caffeine] Other (See Comments)    Drunk. Per MAR  . Latex Other (See Comments)    Per MAR  . Levofloxacin Other (See Comments)    Per MAR  . Nsaids Other (See Comments)    This is not an allergy. The patient was told by Dr. RaRockne Menghinio avoid NSAIDs and stop Vicoprofen in to 2014 to avoid nephrotoxicity.  Per MAR.  . Marland Kitchenxycodone Other (See Comments)    reaction to synthetic codeine Per MAR  . Restasis [Cyclosporine] Other (See Comments)    Per MAR  . Sulfa Antibiotics Other (See Comments)    Per MAR  . Biaxin [Clarithromycin] Rash    With burning sensation Per MAR    Consultations:  Infectious disease   Procedures/Studies: Dg Chest Portable 1 View  Result Date: 06/04/2018 CLINICAL DATA:  Fever with infusion EXAM: PORTABLE CHEST 1 VIEW COMPARISON:  Chest radiograph December 12, 2016 and chest CT May 14, 2017. FINDINGS: There is underlying emphysematous change, better delineated on prior CT. There is no edema or consolidation. There is mild scarring in the right mid lower lung zones. There is no consolidation or edema. Heart size and pulmonary vascularity are normal. No adenopathy. There is aortic atherosclerosis. Bones are osteoporotic. IMPRESSION: Underlying emphysematous change with areas of mild scarring on the right. No frank edema or consolidation. Heart size within normal limits. There is aortic atherosclerosis. Bones osteoporotic. Aortic Atherosclerosis (ICD10-I70.0) and Emphysema (ICD10-J43.9).  Electronically Signed   By: WiLowella GripII M.D.   On: 06/04/2018 12:25   [employer]  Result Date: 06/07/2018 If [facility] image not attached, placement could not be confirmed due to current cardiac rhythm. Echocardiogram IMPRESSIONS    1. The left ventricle has normal systolic function, with an ejection fraction of 55-60%. The cavity size was normal.  2. The right ventricle has normal systolc function. The cavity was normal. There is no increase in right ventricular wall thickness.  3. There is mild to moderate mitral annular calcification present.  4. The tricuspid valve was grossly normal.  5. The aortic valve is tricuspid Mild thickening of the aortic valve Mild calcification of the aortic valve.  6. The inferior vena cava was normal in size with <50% respiratory variability.    Subjective: No complaints.  No headache, no chest pain, no trouble breathing, no abdominal pain, no nausea.  Per nursing, no fever, no respiratory distress.  Discharge Exam: Vitals:   06/07/18 2135 06/08/18 0417  BP: (!) 143/93 (!) 150/85  Pulse: 74 66  Resp: 18 18  Temp: 98.1 F (36.7 C) 98 F (36.7 C)  SpO2: 100% 99%   Vitals:   06/07/18 1509 06/07/18 2028 06/07/18 2135 06/08/18 0417  BP: (!) 150/79 (!) 156/76 (!) 143/93 (!) 150/85  Pulse: 71 86 74 66  Resp: _0 Temp: 97.6 F (36.4 C) 97.9 F (36.6 C) 98.1 F (36.7 C) 98 F (36.7 C)  TempSrc: Oral Oral  Oral  SpO2: 99% 96% 100% 99%  Weight:    34.8 kg  Height:        General: Pt is awake, not in acute distress, interactive, blind, very hard of hearing Cardiovascular: RRR, nl S1-S2, no murmurs appreciated.   No LE edema.   Respiratory: Normal respiratory rate and rhythm.  CTAB without rales or wheezes. Abdominal: Abdomen soft and non-tender.  No distension or HSM.   MSK: Severe diffuse loss of subcutaneous muscle mass and fat. Neuro/Psych: Strength symmetric in upper and lower extremities.  Judgment and insight  appear severely impaired by dementia.   The results of significant diagnostics from this hospitalization (including imaging, microbiology, ancillary and laboratory) are listed below for reference.     Microbiology: Recent Results (from the past 240 hour(s))  Blood Culture (routine x 2)     Status: Abnormal   Collection Time: 06/04/18 11:22 AM  Result Value Ref Range Status   Specimen Description BLOOD SITE NOT SPECIFIED  Final   Special Requests   Final    BOTTLES DRAWN AEROBIC AND ANAEROBIC Blood Culture adequate volume   Culture  Setup Time   Final    GRAM POSITIVE COCCI ANAEROBIC BOTTLE ONLY CRITICAL RESULT CALLED TO, READ BACK BY AND VERIFIED WITH: Cristopher Estimable PharmD 9:20 06/05/18 ([email or web address])    Culture (A)  Final    METHICILLIN RESISTANT STAPHYLOCOCCUS AUREUS AEROCOCCUS SPECIES Standardized susceptibility testing for this organism is not available. Performed at [facility], [place] [address], [place], [place] [address]    Report Status 06/07/2018 FINAL  Final   Organism ID, Bacteria METHICILLIN RESISTANT STAPHYLOCOCCUS AUREUS  Final      Susceptibility   Methicillin resistant staphylococcus aureus - MIC*    CIPROFLOXACIN >=8 RESISTANT Resistant     ERYTHROMYCIN >=8 RESISTANT Resistant     GENTAMICIN <=0.5 SENSITIVE Sensitive     OXACILLIN >=4 RESISTANT Resistant     TETRACYCLINE <=1 SENSITIVE Sensitive     VANCOMYCIN <=0.5 SENSITIVE Sensitive     TRIMETH/SULFA >=320 RESISTANT Resistant     CLINDAMYCIN <=0.25 SENSITIVE Sensitive  RIFAMPIN <=0.5 SENSITIVE Sensitive     Inducible Clindamycin NEGATIVE Sensitive     * METHICILLIN RESISTANT STAPHYLOCOCCUS AUREUS  Blood Culture ID Panel (Reflexed)     Status: Abnormal   Collection Time: 06/04/18 11:22 AM  Result Value Ref Range Status   Enterococcus species NOT DETECTED NOT DETECTED Final   Listeria monocytogenes NOT DETECTED NOT DETECTED Final   Staphylococcus species DETECTED (A) NOT DETECTED Final    Comment:  CRITICAL RESULT CALLED TO, READ BACK BY AND VERIFIED WITH: Cristopher Estimable PharmD 9:20 06/05/18 ([email or web address])    Staphylococcus aureus (BCID) DETECTED (A) NOT DETECTED Final    Comment: Methicillin (oxacillin)-resistant Staphylococcus aureus (MRSA). MRSA is predictably resistant to beta-lactam antibiotics (except ceftaroline). Preferred therapy is vancomycin unless clinically contraindicated. Patient requires contact precautions if  hospitalized. CRITICAL RESULT CALLED TO, READ BACK BY AND VERIFIED WITH: Cristopher Estimable PharmD 9:20 06/05/18 ([email or web address])    Methicillin resistance DETECTED (A) NOT DETECTED Final    Comment: CRITICAL RESULT CALLED TO, READ BACK BY AND VERIFIED WITH: Cristopher Estimable PharmD 9:20 06/05/18 ([email or web address])    Streptococcus species NOT DETECTED NOT DETECTED Final   Streptococcus agalactiae NOT DETECTED NOT DETECTED Final   Streptococcus pneumoniae NOT DETECTED NOT DETECTED Final   Streptococcus pyogenes NOT DETECTED NOT DETECTED Final   Acinetobacter baumannii NOT DETECTED NOT DETECTED Final   Enterobacteriaceae species NOT DETECTED NOT DETECTED Final   Enterobacter cloacae complex NOT DETECTED NOT DETECTED Final   Escherichia coli NOT DETECTED NOT DETECTED Final   Klebsiella oxytoca NOT DETECTED NOT DETECTED Final   Klebsiella pneumoniae NOT DETECTED NOT DETECTED Final   Proteus species NOT DETECTED NOT DETECTED Final   Serratia marcescens NOT DETECTED NOT DETECTED Final   Haemophilus influenzae NOT DETECTED NOT DETECTED Final   Neisseria meningitidis NOT DETECTED NOT DETECTED Final   Pseudomonas aeruginosa NOT DETECTED NOT DETECTED Final   Candida albicans NOT DETECTED NOT DETECTED Final   Candida glabrata NOT DETECTED NOT DETECTED Final   Candida krusei NOT DETECTED NOT DETECTED Final   Candida parapsilosis NOT DETECTED NOT DETECTED Final   Candida tropicalis NOT DETECTED NOT DETECTED Final    Comment: Performed at [facility], [place]. [address] [place], [place] [address]   SARS Coronavirus 2 [facility] order, Performed in [facility] hospital lab)     Status: None   Collection Time: 06/04/18 11:25 AM  Result Value Ref Range Status   SARS Coronavirus 2 NEGATIVE NEGATIVE Final    Comment: (NOTE) If result is NEGATIVE SARS-CoV-2 target nucleic acids are NOT DETECTED. The SARS-CoV-2 RNA is generally detectable in upper and lower  respiratory specimens during the acute phase of infection. The lowest  concentration of SARS-CoV-2 viral copies this assay can detect is 250  copies / mL. A negative result does not preclude SARS-CoV-2 infection  and should not be used as the sole basis for treatment or other  patient management decisions.  A negative result may occur with  improper specimen collection / handling, submission of specimen other  than nasopharyngeal swab, presence of viral mutation(s) within the  areas targeted by this assay, and inadequate number of viral copies  (<250 copies / mL). A negative result must be combined with clinical  observations, patient history, and epidemiological information. If result is POSITIVE SARS-CoV-2 target nucleic acids are DETECTED. The SARS-CoV-2 RNA is generally detectable in upper and lower  respiratory specimens dur ing the acute phase of infection.  Positive  results are indicative of active infection with  SARS-CoV-2.  Clinical  correlation with patient history and other diagnostic information is  necessary to determine patient infection status.  Positive results do  not rule out bacterial infection or co-infection with other viruses. If result is PRESUMPTIVE POSTIVE SARS-CoV-2 nucleic acids MAY BE PRESENT.   A presumptive positive result was obtained on the submitted specimen  and confirmed on repeat testing.  While 2019 novel coronavirus  (SARS-CoV-2) nucleic acids may be present in the submitted sample  additional confirmatory testing may be necessary for epidemiological  and / or clinical management purposes   to differentiate between  SARS-CoV-2 and other Sarbecovirus currently known to infect humans.  If clinically indicated additional testing with an alternate test  methodology [phone]) is advised. The SARS-CoV-2 RNA is generally  detectable in upper and lower respiratory sp ecimens during the acute  phase of infection. The expected result is Negative. Fact Sheet for Patients:  StrictlyIdeas.no Fact Sheet for Healthcare Providers: BankingDealers.co.za This test is not yet approved or cleared by the [place] FDA and has been authorized for detection and/or diagnosis of SARS-CoV-2 by FDA under an Emergency Use Authorization (EUA).  This EUA will remain in effect (meaning this test can be used) for the duration of the COVID-19 declaration under Section 564(b)(1) of the Act, 21 U.S.C. section 360bbb-3(b)(1), unless the authorization is terminated or revoked sooner. Performed at [facility], [place] [address], [place], [place] [address]   Urine culture     Status: Abnormal   Collection Time: 06/04/18 11:42 AM  Result Value Ref Range Status   Specimen Description URINE, RANDOM  Final   Special Requests   Final    STERILE CONTAINER Performed at [facility], [place] [address], [place], [place] [address]    Culture 80,000 COLONIES/mL PROTEUS MIRABILIS (A)  Final   Report Status 06/06/2018 FINAL  Final   Organism ID, Bacteria PROTEUS MIRABILIS (A)  Final      Susceptibility   Proteus mirabilis - MIC*    AMPICILLIN <=2 SENSITIVE Sensitive     CEFAZOLIN <=4 SENSITIVE Sensitive     CEFTRIAXONE <=1 SENSITIVE Sensitive     CIPROFLOXACIN 2 INTERMEDIATE Intermediate     GENTAMICIN <=1 SENSITIVE Sensitive     IMIPENEM 2 SENSITIVE Sensitive     NITROFURANTOIN 128 RESISTANT Resistant     TRIMETH/SULFA <=20 SENSITIVE Sensitive     AMPICILLIN/SULBACTAM <=2 SENSITIVE Sensitive     PIP/TAZO <=4 SENSITIVE Sensitive     * 80,000 COLONIES/mL  PROTEUS MIRABILIS  Blood Culture (routine x 2)     Status: None (Preliminary result)   Collection Time: 06/04/18 11:50 AM  Result Value Ref Range Status   Specimen Description BLOOD RIGHT FOREARM  Final   Special Requests   Final    BOTTLES DRAWN AEROBIC AND ANAEROBIC Blood Culture adequate volume   Culture   Final    NO GROWTH 4 DAYS Performed at [facility], [address] [place], [place] [address]    Report Status PENDING  Incomplete  Culture, blood (routine x 2)     Status: None (Preliminary result)   Collection Time: 06/05/18  9:28 AM  Result Value Ref Range Status   Specimen Description BLOOD RIGHT HAND  Final   Special Requests   Final    BOTTLES DRAWN AEROBIC ONLY Blood Culture adequate volume   Culture   Final    NO GROWTH 3 DAYS Performed at [facility], [place],  [place] [address]    Report Status PENDING  Incomplete  Culture, blood (routine x 2)     Status: None (Preliminary result)   Collection Time: 06/05/18  9:40 AM  Result Value Ref Range Status   Specimen Description BLOOD LEFT ANTECUBITAL  Final   Special Requests   Final    BOTTLES DRAWN AEROBIC AND ANAEROBIC Blood Culture adequate volume   Culture   Final    NO GROWTH 3 DAYS Performed at [facility], [place] [address], [place], [place] [address]    Report Status PENDING  Incomplete  Culture, blood (routine x 2)     Status: None (Preliminary result)   Collection Time: 06/06/18  6:45 AM  Result Value Ref Range Status   Specimen Description BLOOD RIGHT ARM  Final   Special Requests   Final    BOTTLES DRAWN AEROBIC AND ANAEROBIC Blood Culture adequate volume   Culture   Final    NO GROWTH 2 DAYS Performed at [facility], [address], [place], [place] [address]    Report Status PENDING  Incomplete  Culture, blood (routine x 2)     Status: None (Preliminary result)   Collection Time: 06/06/18  7:00 AM  Result Value Ref Range Status   Specimen Description BLOOD  LEFT ANTECUBITAL  Final   Special Requests   Final    BOTTLES DRAWN AEROBIC AND ANAEROBIC Blood Culture adequate volume   Culture   Final    NO GROWTH 2 DAYS Performed at [facility], [place] [address] [place], [place] [address]    Report Status PENDING  Incomplete     Labs: BNP (last 3 results) No results for input(s): BNP in the last 8760 hours. Basic Metabolic Panel: Recent Labs  Lab 06/04/18 1121 06/05/18 0320 06/06/18 0646 06/07/18 0317 06/08/18 0317  NA 144 145 145 144 142  K 3.8 2.9* 2.4* 2.9* 2.4*  CL 111 116* 111 114* 106  CO2 23 21* 21* 19* 21*  GLUCOSE 119* 105* 100* 110* 89  BUN 25* _0 CREATININE 1.16* 0.82 0.67 0.58 0.70  CALCIUM 8.9 8.2* 8.5* 8.7* 9.0  MG  --   --   --   --  1.6*   Liver Function Tests: Recent Labs  Lab 06/04/18 1121 06/05/18 0320  AST 22 22  ALT 16 16  ALKPHOS 66 59  BILITOT 0.5 0.5  PROT 5.9* 5.7*  ALBUMIN 2.8* 2.5*   No results for input(s): LIPASE, AMYLASE in the last 168 hours. No results for input(s): AMMONIA in the last 168 hours. CBC: Recent Labs  Lab 06/04/18 1121 06/05/18 0320 06/06/18 0646 06/07/18 0317 06/08/18 0317  WBC 10.6* 10.3 8.6 7.4 7.6  NEUTROABS 8.6*  --   --   --   --   HGB 11.4* 10.8* 11.3* 11.9* 12.8  HCT 36.8 33.8* 34.9* 36.1 38.8  MCV 97.6 95.5 93.1 90.7 90.2  PLT 163 140* 167 205 200   Cardiac Enzymes: Recent Labs  Lab 06/04/18 1121  TROPONINI 0.04*   BNP: Invalid input(s): POCBNP CBG: No results for input(s): GLUCAP in the last 168 hours. D-Dimer No results for input(s): DDIMER in the last 72 hours. Hgb A1c No results for input(s): HGBA1C in the last 72 hours. Lipid Profile No results for input(s): CHOL, HDL, LDLCALC, TRIG, CHOLHDL, LDLDIRECT in the last 72 hours. Thyroid function studies No results for input(s): TSH, T4TOTAL, T3FREE, THYROIDAB in the last 72 hours.  Invalid input(s): FREET3 Anemia work up No results for input(s): VITAMINB12, FOLATE, FERRITIN,  TIBC, IRON, RETICCTPCT in the last 72 hours. Urinalysis    Component Value Date/Time   COLORURINE AMBER (A) 06/04/2018 1142   APPEARANCEUR TURBID (A) 06/04/2018 1142   LABSPEC 1.019 06/04/2018 1142   PHURINE 6.0 06/04/2018 1142   GLUCOSEU NEGATIVE 06/04/2018 1142   HGBUR SMALL (A) 06/04/2018 1142   BILIRUBINUR NEGATIVE 06/04/2018 1142   BILIRUBINUR neg 06/20/2013 1736   KETONESUR NEGATIVE 06/04/2018 1142   PROTEINUR 100 (A) 06/04/2018 1142   UROBILINOGEN 0.2 12/25/2014 1620   NITRITE NEGATIVE 06/04/2018 1142   LEUKOCYTESUR MODERATE (A) 06/04/2018 1142   Sepsis Labs Invalid input(s): PROCALCITONIN,  WBC,  LACTICIDVEN Microbiology Recent Results (from the past 240 hour(s))  Blood Culture (routine x 2)     Status: Abnormal   Collection Time: 06/04/18 11:22 AM  Result Value Ref Range Status   Specimen Description BLOOD SITE NOT SPECIFIED  Final   Special Requests   Final    BOTTLES DRAWN AEROBIC AND ANAEROBIC Blood Culture adequate volume   Culture  Setup Time   Final    GRAM POSITIVE COCCI ANAEROBIC BOTTLE ONLY CRITICAL RESULT CALLED TO, READ BACK BY AND VERIFIED WITH: Cristopher Estimable PharmD 9:20 06/05/18 ([email or web address])    Culture (A)  Final    METHICILLIN RESISTANT STAPHYLOCOCCUS AUREUS AEROCOCCUS SPECIES Standardized susceptibility testing for this organism is not available. Performed at [facility], [place] [address] [place], [place] [address]    Report Status 06/07/2018 FINAL  Final   Organism ID, Bacteria METHICILLIN RESISTANT STAPHYLOCOCCUS AUREUS  Final      Susceptibility   Methicillin resistant staphylococcus aureus - MIC*    CIPROFLOXACIN >=8 RESISTANT Resistant     ERYTHROMYCIN >=8 RESISTANT Resistant     GENTAMICIN <=0.5 SENSITIVE Sensitive     OXACILLIN >=4 RESISTANT Resistant     TETRACYCLINE <=1 SENSITIVE Sensitive     VANCOMYCIN <=0.5 SENSITIVE Sensitive     TRIMETH/SULFA >=320 RESISTANT Resistant     CLINDAMYCIN <=0.25 SENSITIVE Sensitive     RIFAMPIN  <=0.5 SENSITIVE Sensitive     Inducible Clindamycin NEGATIVE Sensitive     * METHICILLIN RESISTANT STAPHYLOCOCCUS AUREUS  Blood Culture ID Panel (Reflexed)     Status: Abnormal   Collection Time: 06/04/18 11:22 AM  Result Value Ref Range Status   Enterococcus species NOT DETECTED NOT DETECTED Final   Listeria monocytogenes NOT DETECTED NOT DETECTED Final   Staphylococcus species DETECTED (A) NOT DETECTED Final    Comment: CRITICAL RESULT CALLED TO, READ BACK BY AND VERIFIED WITH: Cristopher Estimable PharmD 9:20 06/05/18 ([email or web address])    Staphylococcus aureus (BCID) DETECTED (A) NOT DETECTED Final    Comment: Methicillin (oxacillin)-resistant Staphylococcus aureus (MRSA). MRSA is predictably resistant to beta-lactam antibiotics (except ceftaroline). Preferred therapy is vancomycin unless clinically contraindicated. Patient requires contact precautions if  hospitalized. CRITICAL RESULT CALLED TO, READ BACK BY AND VERIFIED WITH: Cristopher Estimable PharmD 9:20 06/05/18 ([email or web address])    Methicillin resistance DETECTED (A) NOT DETECTED Final    Comment: CRITICAL RESULT CALLED TO, READ BACK BY AND VERIFIED WITH: Cristopher Estimable PharmD 9:20 06/05/18 ([email or web address])    Streptococcus species NOT DETECTED NOT DETECTED Final   Streptococcus agalactiae NOT DETECTED NOT DETECTED Final   Streptococcus pneumoniae NOT DETECTED NOT DETECTED Final   Streptococcus pyogenes NOT DETECTED NOT DETECTED Final   Acinetobacter baumannii NOT DETECTED NOT DETECTED Final   Enterobacteriaceae species NOT DETECTED NOT DETECTED Final   Enterobacter cloacae  complex NOT DETECTED NOT DETECTED Final   Escherichia coli NOT DETECTED NOT DETECTED Final   Klebsiella oxytoca NOT DETECTED NOT DETECTED Final   Klebsiella pneumoniae NOT DETECTED NOT DETECTED Final   Proteus species NOT DETECTED NOT DETECTED Final   Serratia marcescens NOT DETECTED NOT DETECTED Final   Haemophilus influenzae NOT DETECTED NOT DETECTED Final   Neisseria meningitidis NOT  DETECTED NOT DETECTED Final   Pseudomonas aeruginosa NOT DETECTED NOT DETECTED Final   Candida albicans NOT DETECTED NOT DETECTED Final   Candida glabrata NOT DETECTED NOT DETECTED Final   Candida krusei NOT DETECTED NOT DETECTED Final   Candida parapsilosis NOT DETECTED NOT DETECTED Final   Candida tropicalis NOT DETECTED NOT DETECTED Final    Comment: Performed at [facility], [name] [address], [place], [place] [address]  SARS Coronavirus 2 [facility] order, Performed in [facility] hospital lab)     Status: None   Collection Time: 06/04/18 11:25 AM  Result Value Ref Range Status   SARS Coronavirus 2 NEGATIVE NEGATIVE Final    Comment: (NOTE) If result is NEGATIVE SARS-CoV-2 target nucleic acids are NOT DETECTED. The SARS-CoV-2 RNA is generally detectable in upper and lower  respiratory specimens during the acute phase of infection. The lowest  concentration of SARS-CoV-2 viral copies this assay can detect is 250  copies / mL. A negative result does not preclude SARS-CoV-2 infection  and should not be used as the sole basis for treatment or other  patient management decisions.  A negative result may occur with  improper specimen collection / handling, submission of specimen other  than nasopharyngeal swab, presence of viral mutation(s) within the  areas targeted by this assay, and inadequate number of viral copies  (<250 copies / mL). A negative result must be combined with clinical  observations, patient history, and epidemiological information. If result is POSITIVE SARS-CoV-2 target nucleic acids are DETECTED. The SARS-CoV-2 RNA is generally detectable in upper and lower  respiratory specimens dur ing the acute phase of infection.  Positive  results are indicative of active infection with SARS-CoV-2.  Clinical  correlation with patient history and other diagnostic information is  necessary to determine patient infection status.  Positive results do  not rule out bacterial  infection or co-infection with other viruses. If result is PRESUMPTIVE POSTIVE SARS-CoV-2 nucleic acids MAY BE PRESENT.   A presumptive positive result was obtained on the submitted specimen  and confirmed on repeat testing.  While 2019 novel coronavirus  (SARS-CoV-2) nucleic acids may be present in the submitted sample  additional confirmatory testing may be necessary for epidemiological  and / or clinical management purposes  to differentiate between  SARS-CoV-2 and other Sarbecovirus currently known to infect humans.  If clinically indicated additional testing with an alternate test  methodology [phone]) is advised. The SARS-CoV-2 RNA is generally  detectable in upper and lower respiratory sp ecimens during the acute  phase of infection. The expected result is Negative. Fact Sheet for Patients:  StrictlyIdeas.no Fact Sheet for Healthcare Providers: BankingDealers.co.za This test is not yet approved or cleared by the [place] FDA and has been authorized for detection and/or diagnosis of SARS-CoV-2 by FDA under an Emergency Use Authorization (EUA).  This EUA will remain in effect (meaning this test can be used) for the duration of the COVID-19 declaration under Section 564(b)(1) of the Act, 21 U.S.C. section 360bbb-3(b)(1), unless the authorization is terminated or revoked sooner. Performed at [facility], [place]  [address], [place], [place] [address]   Urine culture     Status: Abnormal   Collection Time: 06/04/18 11:42 AM  Result Value Ref Range Status   Specimen Description URINE, RANDOM  Final   Special Requests   Final    STERILE CONTAINER Performed at [facility], [place] [address] [place], [place] [address]    Culture 80,000 COLONIES/mL PROTEUS MIRABILIS (A)  Final   Report Status 06/06/2018 FINAL  Final   Organism ID, Bacteria PROTEUS MIRABILIS (A)  Final      Susceptibility   Proteus mirabilis - MIC*     AMPICILLIN <=2 SENSITIVE Sensitive     CEFAZOLIN <=4 SENSITIVE Sensitive     CEFTRIAXONE <=1 SENSITIVE Sensitive     CIPROFLOXACIN 2 INTERMEDIATE Intermediate     GENTAMICIN <=1 SENSITIVE Sensitive     IMIPENEM 2 SENSITIVE Sensitive     NITROFURANTOIN 128 RESISTANT Resistant     TRIMETH/SULFA <=20 SENSITIVE Sensitive     AMPICILLIN/SULBACTAM <=2 SENSITIVE Sensitive     PIP/TAZO <=4 SENSITIVE Sensitive     * 80,000 COLONIES/mL PROTEUS MIRABILIS  Blood Culture (routine x 2)     Status: None (Preliminary result)   Collection Time: 06/04/18 11:50 AM  Result Value Ref Range Status   Specimen Description BLOOD RIGHT FOREARM  Final   Special Requests   Final    BOTTLES DRAWN AEROBIC AND ANAEROBIC Blood Culture adequate volume   Culture   Final    NO GROWTH 4 DAYS Performed at [facility], [address] [place], [place] [address]    Report Status PENDING  Incomplete  Culture, blood (routine x 2)     Status: None (Preliminary result)   Collection Time: 06/05/18  9:28 AM  Result Value Ref Range Status   Specimen Description BLOOD RIGHT HAND  Final   Special Requests   Final    BOTTLES DRAWN AEROBIC ONLY Blood Culture adequate volume   Culture   Final    NO GROWTH 3 DAYS Performed at [facility], [place] [address], [place], [place] [address]    Report Status PENDING  Incomplete  Culture, blood (routine x 2)     Status: None (Preliminary result)   Collection Time: 06/05/18  9:40 AM  Result Value Ref Range Status   Specimen Description BLOOD LEFT ANTECUBITAL  Final   Special Requests   Final    BOTTLES DRAWN AEROBIC AND ANAEROBIC Blood Culture adequate volume   Culture   Final    NO GROWTH 3 DAYS Performed at [facility], [place] [address], [place], [place] [address]    Report Status PENDING  Incomplete  Culture, blood (routine x 2)     Status: None (Preliminary result)   Collection Time: 06/06/18  6:45 AM  Result Value Ref Range Status   Specimen Description BLOOD  RIGHT ARM  Final   Special Requests   Final    BOTTLES DRAWN AEROBIC AND ANAEROBIC Blood Culture adequate volume   Culture   Final    NO GROWTH 2 DAYS Performed at [facility], [address] [place], [place] [address]    Report Status PENDING  Incomplete  Culture, blood (routine x 2)     Status: None (Preliminary result)   Collection Time: 06/06/18  7:00 AM  Result Value Ref Range Status   Specimen Description BLOOD LEFT ANTECUBITAL  Final   Special Requests   Final    BOTTLES DRAWN AEROBIC AND  ANAEROBIC Blood Culture adequate volume   Culture   Final    NO GROWTH 2 DAYS Performed at [facility], [place] [address] [place], [place] [address]    Report Status PENDING  Incomplete     Time coordinating discharge: 40 minutes      SIGNED:   Edwin Dada, MD  [name] 06/08/2018, 11:14 AM

## 2018-06-08 NOTE — Progress Notes (Signed)
CRITICAL VALUE ALERT  Critical Value:  Potassium 2.4  Date & Time Notied:  06/08/2018 at [place]  Provider Notified: X. Blount  Orders Received/Actions taken: Give 4 runs of iv potassium.

## 2018-06-08 NOTE — Progress Notes (Signed)
PHARMACY CONSULT NOTE FOR:  OUTPATIENT  PARENTERAL ANTIBIOTIC THERAPY (OPAT)  Indication: MRSA Bacteremia Regimen: Vancomycin 750 mg IV q48h End date: 06/19/18  IV antibiotic discharge orders are pended. To discharging provider:  please sign these orders via discharge navigator,  Select New Orders & click on the button choice - Manage This Unsigned Work.     Thank you for allowing pharmacy to be a part of this patient's care.  Jackson Latino, PharmD PGY1 Pharmacy Resident Phone [phone] 06/08/2018     11:00 AM

## 2018-06-08 NOTE — Progress Notes (Signed)
Peripherally Inserted Central Catheter/Midline Placement  The IV Nurse has discussed with the patient and/or persons authorized to consent for the patient, the purpose of this procedure and the potential benefits and risks involved with this procedure.  The benefits include less needle sticks, lab draws from the catheter, and the patient may be discharged home with the catheter. Risks include, but not limited to, infection, bleeding, blood clot (thrombus formation), and puncture of an artery; nerve damage and irregular heartbeat and possibility to perform a PICC exchange if needed/ordered by physician.  Alternatives to this procedure were also discussed.  Bard Power PICC patient education guide, fact sheet on infection prevention and patient information card has been provided to patient /or left at bedside.  Telephone Consent obtained from daughter.  PICC/Midline Placement Documentation  PICC Single Lumen 95/97/47 PICC Right Basilic 33 cm 0 cm (Active)  Indication for Insertion or Continuance of Line Prolonged intravenous therapies 06/08/2018 12:46 PM  Exposed Catheter (cm) 0 cm 06/08/2018 12:46 PM  Site Assessment Clean;Dry;Intact 06/08/2018 12:46 PM  Line Status Flushed;Saline locked;Blood return noted 06/08/2018 12:46 PM  Dressing Type Transparent 06/08/2018 12:46 PM  Dressing Status Clean;Dry;Intact;Antimicrobial disc in place 06/08/2018 12:46 PM  Line Care Connections checked and tightened 06/08/2018 12:46 PM  Line Adjustment (NICU/IV Team Only) No 06/08/2018 12:46 PM  Dressing Intervention New dressing 06/08/2018 12:46 PM  Dressing Change Due 06/15/18 06/08/2018 12:46 PM       Rolena Infante 06/08/2018, 12:46 PM

## 2018-06-08 NOTE — Progress Notes (Signed)
Clinical Social Worker facilitated patient discharge including contacting patient family and facility to confirm patient discharge plans. Clinical information faxed to facility and family agreeable with plan. CSW arranged ambulance transport via [facility] to [facility]. RN to call for report prior to discharge([phone]) Rm:[id number].  Clinical Social Worker will sign off for now as social work intervention is no longer needed. Please consult [place] again if new need arises.  [place], [place]

## 2018-06-08 NOTE — Progress Notes (Signed)
Pharmacy Antibiotic Note  Taylor Roth is a 85 y.o. female admitted from SNF on 06/04/2018 with MRSA bacteremia. Pharmacy has been consulted for vancomycin dosing.  Patient remains afebrile and WBC is within normal limits. Renal function is stable. Repeat blood cultures from 4/23 have been no growth x3 days and from 4/24 are no growth x2 days. TTE was negative for vegetations. Will likely treat for 2 weeks from first negative blood cultures pending ID recommendations.  Plan: Continue Vancomycin 750mg  IV q48h F/u clinical status, C&S, renal function, vancomycin levels as appropriate  Height: 4\' 11"  (149.9 cm) Weight: 76 lb 11.5 oz (34.8 kg) IBW/kg (Calculated) : 43.2  Temp (24hrs), Avg:97.9 F (36.6 C), Min:97.6 F (36.4 C), Max:98.1 F (36.7 C)  Recent Labs  Lab 06/04/18 1121 06/04/18 1432 06/05/18 0320 06/06/18 0646 06/07/18 0317 06/08/18 0317  WBC 10.6*  --  10.3 8.6 7.4 7.6  CREATININE 1.16*  --  0.82 0.67 0.58 0.70  LATICACIDVEN 1.4 1.9  --   --   --   --     Estimated Creatinine Clearance: 27.2 mL/min (by C-G formula based on SCr of 0.7 mg/dL).    Allergies  Allergen Reactions  . Amoxicillin     Per daughter  . Ampicillin     Per daughter  . Depakote [Valproic Acid]     DOES NOT WANT HER TO TAKE IT==Makes her hair fall out= per daughter who is poa  . Macrobid [Nitrofurantoin Macrocrystal] Rash    Per MAR  . Penicillins     Per daughter  . Azithromycin Other (See Comments)    Per MAR  . Doxycycline Other (See Comments)    Per MAR  . Escitalopram Oxalate Other (See Comments)    Per MAR  . Fioricet [Butalbital-Apap-Caffeine] Other (See Comments)    Drunk. Per MAR  . Latex Other (See Comments)    Per MAR  . Levofloxacin Other (See Comments)    Per MAR  . Nsaids Other (See Comments)    This is not an allergy. The patient was told by Dr. Rockne Menghini to avoid NSAIDs and stop Vicoprofen in to 2014 to avoid nephrotoxicity.  Per MAR.  Marland Kitchen Oxycodone Other (See  Comments)    reaction to synthetic codeine Per MAR  . Restasis [Cyclosporine] Other (See Comments)    Per MAR  . Sulfa Antibiotics Other (See Comments)    Per MAR  . Biaxin [Clarithromycin] Rash    With burning sensation Per MAR    Antimicrobials this admission: Ceftriaxone 4/23 x1 Vancomycin 4/23 >>  Microbiology results: 4/22 UCx: 80k Proteus 4/22 BCx: 1/4 GPC (BCID MRSA) 4/22 COVID: neg 4/23 BCx: NGTD 4/24 Bcx: NGTD  Thank you for allowing pharmacy to be a part of this patient's care.  Jackson Latino, PharmD PGY1 Pharmacy Resident Phone [phone] 06/08/2018     8:51 AM

## 2018-06-08 NOTE — Progress Notes (Signed)
CRITICAL VALUE ALERT  Critical Value: k 2.7  Date & Time Notied: 06/08/2018 1141  Provider Notified: Dr. Loleta Books notified   Orders Received/Actions taken: new orders placed

## 2018-06-08 NOTE — Progress Notes (Signed)
[name] to be D/C'd Home per MD order.  Discussed with the patient and all questions fully answered.  VSS, Skin clean, dry and intact without evidence of skin break down, no evidence of skin tears noted. IV catheter discontinued intact. Site without signs and symptoms of complications. Dressing and pressure applied.  An After Visit Summary was printed and given to the patient. Patient received prescription.  D/c education completed with patient/family including follow up instructions, medication list, d/c activities limitations if indicated, with other d/c instructions as indicated by MD - patient able to verbalize understanding, all questions fully answered.   Patient instructed to return to ED, call 911, or call MD for any changes in condition.   Patient escorted via [place], and D/C home via private auto.  [name] 06/08/2018 5:07 PM

## 2018-06-08 NOTE — Discharge Summary (Signed)
Physician Discharge Summary  CHAMEKA MCMULLEN KUV:[id number] DOB: Dec 13, 1928 DOA: 06/04/2018  PCP: [name], MD  Admit date: 06/04/2018 Discharge date: 06/08/2018  Admitted From: SNF  Disposition:  SNF   Recommendations for Outpatient Follow-up:  1. Continue IV Vancomycin for 14 days, ending May 7 2. Please obtain BMP and vancomycin trough every Monday and Thursday until May 7 3. Please obtain CBC every Monday until May 7 4. Please obtain CRP and ESR On May 7 5. Remove PICC after last Vancomycin dose 6. Continue oral potassium supplement and repeat BMP/K on Monday, Thursday 7. Stop Seroquel     Home Health: N/A  Equipment/Devices: N/A  Discharge Condition: Poor  CODE STATUS: DO NOT RESUSCITATE Diet recommendation: Regular  Brief/Interim Summary: Mrs. Klus is a 85 y.o. F with mild dementia, SNF bound, blindness, wheelchair-bound about 2 yrs, CKD III baseline Cr 1.0, COPD, anxiety, protein calorie malnutrition, and chronic decubitus ulcers who presents with decreased mentation, fever and hypotension.  In ER, temp 101, BP 76/46, HR 102.  Give fluids and ceftriaxone and hospitalist service consulted for sepsis.  Urine turbid, CXR clear.  Suspected UTI initially.     PRINCIPAL HOSPITAL DIAGNOSIS: Sepsis from MRSA bacteremia    Discharge Diagnoses:   Sepsis from MRSA bacteremia Presented with fever, tachycardia, hypotension.  Improved with fluids.  Ovenright on admission, 1/2 blood cultures positive for MRSA.  Repeat blood cultures negative.  No further fever.  TTE negative for vegetation.  Infectious disease were consulted, no satellite foci of infection noted.  ID recommended 2 weeks IV Vancomycin for SA bacteremia.    PICC line placed after repeat cultures negative 72 hours.      Acute metabolic encephalopathy superimposed on chronic dementia Initially mentation poor, not oriented to self, not responding to stimuli.  With fluids, vancomycin, mentation  improved.  Able to hold a conversation, express needs, although blind, very hard of hearing.   CKD III No change  Asthma/COPD No active disease  Severe protein calorie malnutrition  Normocytic anemia Stable relative to baseline  Pressure injuries, stage II sacral ulcer stage: Left flank ulcer, both POA           Discharge Instructions   Discharge Instructions    Diet general   Complete by:  As directed    Discharge instructions   Complete by:  As directed    Continue vancomycin IV for 14 days total, ending May 7 Obtain BMP every Monday and Thursday Other labs as outlined in discharge summary  Obtain BMP Tomorrow and Thursday and continue oral potassium supplementation  STOP Seroquel for now Start acetaminophen 1000 mg nightly If needed, restart Seroquel at lower dose, 100 mg nightly  STOP clonazepam  Start ensure, four times daily   Home infusion instructions   Complete by:  As directed    Instructions:  Flushing of vascular access device: 0.9% NaCl pre/post medication administration and prn patency; Heparin 100 u/ml, 60m for implanted ports and Heparin 10u/ml, 534mfor all other central venous catheters.   Increase activity slowly   Complete by:  As directed      Allergies as of 06/08/2018      Reactions   Amoxicillin    Per daughter   Ampicillin    Per daughter   Depakote [valproic Acid]    DOES NOT WANT HER TO TAKE IT==Makes her hair fall out= per daughter who is poa   Macrobid [nitrofurantoin Macrocrystal] Rash   Per MAR   Penicillins  Per daughter   Azithromycin Other (See Comments)   Per MAR   Doxycycline Other (See Comments)   Per MAR   Escitalopram Oxalate Other (See Comments)   Per MAR   Fioricet [butalbital-apap-caffeine] Other (See Comments)   Drunk. Per MAR   Latex Other (See Comments)   Per MAR   Levofloxacin Other (See Comments)   Per MAR   Nsaids Other (See Comments)   This is not an allergy. The patient was told by  Dr. Rockne Menghini to avoid NSAIDs and stop Vicoprofen in to 2014 to avoid nephrotoxicity. Per MAR.   Oxycodone Other (See Comments)   reaction to synthetic codeine Per MAR   Restasis [cyclosporine] Other (See Comments)   Per MAR   Sulfa Antibiotics Other (See Comments)   Per MAR   Biaxin [clarithromycin] Rash   With burning sensation Per MAR      Medication List    STOP taking these medications   clonazePAM 0.5 MG tablet Commonly known as:  KLONOPIN   HYDROcodone-acetaminophen 5-325 MG tablet Commonly known as:  NORCO/VICODIN   QUEtiapine 100 MG tablet Commonly known as:  SEROQUEL   QUEtiapine 300 MG tablet Commonly known as:  SEROQUEL     TAKE these medications   acetaminophen 325 MG tablet Commonly known as:  TYLENOL Take 650 mg by mouth every 6 (six) hours as needed for headache.   albuterol 108 (90 Base) MCG/ACT inhaler Commonly known as:  VENTOLIN HFA Inhale 1 puff into the lungs every 6 (six) hours as needed for shortness of breath.   Biotin 5000 MCG Caps Take 1 capsule by mouth daily.   Claritin 10 MG tablet Generic drug:  loratadine Take 10 mg by mouth daily.   Cranberry 450 MG Tabs Take 1 tablet by mouth 2 (two) times daily.   docusate sodium 100 MG capsule Commonly known as:  COLACE Take 100 mg by mouth 2 (two) times daily.   dorzolamide-timolol 22.3-6.8 MG/ML ophthalmic solution Commonly known as:  COSOPT Place 1 drop into both eyes 2 (two) times daily.   Ensure Take 237 mLs by mouth 4 (four) times daily. D/t weight loss   ferrous sulfate 325 (65 FE) MG tablet Take 325 mg by mouth daily.   fludrocortisone 0.1 MG tablet Commonly known as:  FLORINEF Take 0.1 mg by mouth every morning. for orthostatic hypotension   lactulose 10 GM/15ML solution Commonly known as:  CHRONULAC Take 15 mLs (10 g total) by mouth daily as needed for severe constipation.   mirtazapine 15 MG tablet Commonly known as:  REMERON Take 7.5 mg by mouth at bedtime.    nitroGLYCERIN 0.4 MG SL tablet Commonly known as:  NITROSTAT Place 0.4 mg under the tongue every 5 (five) minutes as needed for chest pain. May use 3 times   polyethylene glycol 17 g packet Commonly known as:  MIRALAX / GLYCOLAX Take 17 g by mouth daily.   potassium chloride SA 20 MEQ tablet Commonly known as:  K-DUR Take 1 tablet (20 mEq total) by mouth daily.   senna 8.6 MG tablet Commonly known as:  SENOKOT Take 2 tablets by mouth 2 (two) times daily as needed for constipation.   Systane 0.4-0.3 % Soln Generic drug:  Polyethyl Glycol-Propyl Glycol Place 2 drops into both eyes every 2 (two) hours as needed (dry eyes).   travoprost (benzalkonium) 0.004 % ophthalmic solution Commonly known as:  TRAVATAN Place 1 drop into both eyes at bedtime.   vancomycin  IVPB Inject 750 mg  into the vein every other day for 10 days. Indication:  MRSA bacteremia Last Day of Therapy:  06/19/18 Labs - Sunday/Monday:  CBC/D, BMP, and vancomycin trough. Labs - Thursday:  BMP and vancomycin trough Labs - Every other week:  ESR and CRP Start taking on:  June 09, 2018   vitamin B-12 1000 MCG tablet Commonly known as:  CYANOCOBALAMIN Take 1,500 mcg by mouth daily. gummies   Vitamin D 1000 units capsule Take 1,000 Units by mouth daily with breakfast.            Home Infusion Instuctions  (From admission, onward)         Start     Ordered   06/08/18 0000  Home infusion instructions    Question:  Instructions  Answer:  Flushing of vascular access device: 0.9% NaCl pre/post medication administration and prn patency; Heparin 100 u/ml, 78m for implanted ports and Heparin 10u/ml, 524mfor all other central venous catheters.   06/08/18 1114         Contact information for after-discharge care    Destination    [facility] Preferred SNF .   Service:  Skilled Nursing Contact information: [phone]            Allergies  Allergen Reactions  . Amoxicillin     Per daughter  . Ampicillin     Per daughter  . Depakote [Valproic Acid]     DOES NOT WANT HER TO TAKE IT==Makes her hair fall out= per daughter who is poa  . Macrobid [Nitrofurantoin Macrocrystal] Rash    Per MAR  . Penicillins     Per daughter  . Azithromycin Other (See Comments)    Per MAR  . Doxycycline Other (See Comments)    Per MAR  . Escitalopram Oxalate Other (See Comments)    Per MAR  . Fioricet [Butalbital-Apap-Caffeine] Other (See Comments)    Drunk. Per MAR  . Latex Other (See Comments)    Per MAR  . Levofloxacin Other (See Comments)    Per MAR  . Nsaids Other (See Comments)    This is not an allergy. The patient was told by Dr. RaRockne Menghinio avoid NSAIDs and stop Vicoprofen in to 2014 to avoid nephrotoxicity.  Per MAR.  . Marland Kitchenxycodone Other (See Comments)    reaction to synthetic codeine Per MAR  . Restasis [Cyclosporine] Other (See Comments)    Per MAR  . Sulfa Antibiotics Other (See Comments)    Per MAR  . Biaxin [Clarithromycin] Rash    With burning sensation Per MAR        Consultations:  Infectious disease   Procedures/Studies: Dg Chest Portable 1 View  Result Date: 06/04/2018 CLINICAL DATA:  Fever with infusion EXAM: PORTABLE CHEST 1 VIEW COMPARISON:  Chest radiograph December 12, 2016 and chest CT May 14, 2017. FINDINGS: There is underlying emphysematous change, better delineated on prior CT. There is no edema or consolidation. There is mild scarring in the right mid lower lung zones. There is no consolidation or edema. Heart size and pulmonary vascularity are normal. No adenopathy. There is aortic atherosclerosis. Bones are osteoporotic. IMPRESSION: Underlying emphysematous change with areas of mild scarring on the right. No frank edema or consolidation. Heart size within normal limits. There is aortic atherosclerosis. Bones osteoporotic. Aortic Atherosclerosis (ICD10-I70.0) and Emphysema  (ICD10-J43.9). Electronically Signed   By: WiLowella GripII M.D.   On: 06/04/2018 12:25   [place]  Ekg Site Rite  Result Date: 06/07/2018 If Site Rite image not attached, placement could not be confirmed due to current cardiac rhythm. Echocardiogram IMPRESSIONS    1. The left ventricle has normal systolic function, with an ejection fraction of 55-60%. The cavity size was normal.  2. The right ventricle has normal systolc function. The cavity was normal. There is no increase in right ventricular wall thickness.  3. There is mild to moderate mitral annular calcification present.  4. The tricuspid valve was grossly normal.  5. The aortic valve is tricuspid Mild thickening of the aortic valve Mild calcification of the aortic valve.  6. The inferior vena cava was normal in size with <50% respiratory variability.    Subjective: No complaints.  No headache, no chest pain, no trouble breathing, no abdominal pain, no nausea.  Per nursing, no fever, no respiratory distress.  Discharge Exam: Vitals:   06/08/18 0417 06/08/18 1210  BP: (!) 150/85 (!) 152/88  Pulse: 66 68  Resp: 18 19  Temp: 98 F (36.7 C) 98.6 F (37 C)  SpO2: 99% 98%   Vitals:   06/07/18 2028 06/07/18 2135 06/08/18 0417 06/08/18 1210  BP: (!) 156/76 (!) 143/93 (!) 150/85 (!) 152/88  Pulse: 86 74 66 68  Resp: 18 18 18 19   Temp: 97.9 F (36.6 C) 98.1 F (36.7 C) 98 F (36.7 C) 98.6 F (37 C)  TempSrc: Oral  Oral Oral  SpO2: 96% 100% 99% 98%  Weight:   34.8 kg   Height:        General: Pt is awake, not in acute distress, interactive, blind, very hard of hearing Cardiovascular: RRR, nl S1-S2, no murmurs appreciated.   No LE edema.   Respiratory: Normal respiratory rate and rhythm.  CTAB without rales or wheezes. Abdominal: Abdomen soft and non-tender.  No distension or HSM.   MSK: Severe diffuse loss of subcutaneous muscle mass and fat. Neuro/Psych: Strength symmetric in upper and lower extremities.  Judgment  and insight appear severely impaired by dementia.   The results of significant diagnostics from this hospitalization (including imaging, microbiology, ancillary and laboratory) are listed below for reference.     Microbiology: Recent Results (from the past 240 hour(s))  Blood Culture (routine x 2)     Status: Abnormal   Collection Time: 06/04/18 11:22 AM  Result Value Ref Range Status   Specimen Description BLOOD SITE NOT SPECIFIED  Final   Special Requests   Final    BOTTLES DRAWN AEROBIC AND ANAEROBIC Blood Culture adequate volume   Culture  Setup Time   Final    GRAM POSITIVE COCCI ANAEROBIC BOTTLE ONLY CRITICAL RESULT CALLED TO, READ BACK BY AND VERIFIED WITH: Cristopher Estimable PharmD 9:20 06/05/18 ([email or web address])    Culture (A)  Final    METHICILLIN RESISTANT STAPHYLOCOCCUS AUREUS AEROCOCCUS SPECIES Standardized susceptibility testing for this organism is not available. Performed at [facility], [place] [address], [place], [place] [address]    Report Status 06/07/2018 FINAL  Final   Organism ID, Bacteria METHICILLIN RESISTANT STAPHYLOCOCCUS AUREUS  Final      Susceptibility   Methicillin resistant staphylococcus aureus - MIC*    CIPROFLOXACIN >=8 RESISTANT Resistant     ERYTHROMYCIN >=8 RESISTANT Resistant     GENTAMICIN <=0.5 SENSITIVE Sensitive     OXACILLIN >=4 RESISTANT Resistant     TETRACYCLINE <=1 SENSITIVE Sensitive     VANCOMYCIN <=0.5 SENSITIVE Sensitive     TRIMETH/SULFA >=320 RESISTANT Resistant  CLINDAMYCIN <=0.25 SENSITIVE Sensitive     RIFAMPIN <=0.5 SENSITIVE Sensitive     Inducible Clindamycin NEGATIVE Sensitive     * METHICILLIN RESISTANT STAPHYLOCOCCUS AUREUS  Blood Culture ID Panel (Reflexed)     Status: Abnormal   Collection Time: 06/04/18 11:22 AM  Result Value Ref Range Status   Enterococcus species NOT DETECTED NOT DETECTED Final   Listeria monocytogenes NOT DETECTED NOT DETECTED Final   Staphylococcus species DETECTED (A) NOT DETECTED Final     Comment: CRITICAL RESULT CALLED TO, READ BACK BY AND VERIFIED WITH: Cristopher Estimable PharmD 9:20 06/05/18 ([email or web address])    Staphylococcus aureus (BCID) DETECTED (A) NOT DETECTED Final    Comment: Methicillin (oxacillin)-resistant Staphylococcus aureus (MRSA). MRSA is predictably resistant to beta-lactam antibiotics (except ceftaroline). Preferred therapy is vancomycin unless clinically contraindicated. Patient requires contact precautions if  hospitalized. CRITICAL RESULT CALLED TO, READ BACK BY AND VERIFIED WITH: Cristopher Estimable PharmD 9:20 06/05/18 ([email or web address])    Methicillin resistance DETECTED (A) NOT DETECTED Final    Comment: CRITICAL RESULT CALLED TO, READ BACK BY AND VERIFIED WITH: Cristopher Estimable PharmD 9:20 06/05/18 ([email or web address])    Streptococcus species NOT DETECTED NOT DETECTED Final   Streptococcus agalactiae NOT DETECTED NOT DETECTED Final   Streptococcus pneumoniae NOT DETECTED NOT DETECTED Final   Streptococcus pyogenes NOT DETECTED NOT DETECTED Final   Acinetobacter baumannii NOT DETECTED NOT DETECTED Final   Enterobacteriaceae species NOT DETECTED NOT DETECTED Final   Enterobacter cloacae complex NOT DETECTED NOT DETECTED Final   Escherichia coli NOT DETECTED NOT DETECTED Final   Klebsiella oxytoca NOT DETECTED NOT DETECTED Final   Klebsiella pneumoniae NOT DETECTED NOT DETECTED Final   Proteus species NOT DETECTED NOT DETECTED Final   Serratia marcescens NOT DETECTED NOT DETECTED Final   Haemophilus influenzae NOT DETECTED NOT DETECTED Final   Neisseria meningitidis NOT DETECTED NOT DETECTED Final   Pseudomonas aeruginosa NOT DETECTED NOT DETECTED Final   Candida albicans NOT DETECTED NOT DETECTED Final   Candida glabrata NOT DETECTED NOT DETECTED Final   Candida krusei NOT DETECTED NOT DETECTED Final   Candida parapsilosis NOT DETECTED NOT DETECTED Final   Candida tropicalis NOT DETECTED NOT DETECTED Final    Comment: Performed at [facility], [place]. [address] [place],  [place] [address]  SARS Coronavirus 2 [facility] order, Performed in [facility] hospital lab)     Status: None   Collection Time: 06/04/18 11:25 AM  Result Value Ref Range Status   SARS Coronavirus 2 NEGATIVE NEGATIVE Final    Comment: (NOTE) If result is NEGATIVE SARS-CoV-2 target nucleic acids are NOT DETECTED. The SARS-CoV-2 RNA is generally detectable in upper and lower  respiratory specimens during the acute phase of infection. The lowest  concentration of SARS-CoV-2 viral copies this assay can detect is 250  copies / mL. A negative result does not preclude SARS-CoV-2 infection  and should not be used as the sole basis for treatment or other  patient management decisions.  A negative result may occur with  improper specimen collection / handling, submission of specimen other  than nasopharyngeal swab, presence of viral mutation(s) within the  areas targeted by this assay, and inadequate number of viral copies  (<250 copies / mL). A negative result must be combined with clinical  observations, patient history, and epidemiological information. If result is POSITIVE SARS-CoV-2 target nucleic acids are DETECTED. The SARS-CoV-2 RNA is generally detectable in upper and lower  respiratory specimens dur ing the acute phase of infection.  Positive  results are indicative of active infection with SARS-CoV-2.  Clinical  correlation with patient history and other diagnostic information is  necessary to determine patient infection status.  Positive results do  not rule out bacterial infection or co-infection with other viruses. If result is PRESUMPTIVE POSTIVE SARS-CoV-2 nucleic acids MAY BE PRESENT.   A presumptive positive result was obtained on the submitted specimen  and confirmed on repeat testing.  While 2019 novel coronavirus  (SARS-CoV-2) nucleic acids may be present in the submitted sample  additional confirmatory testing may be necessary for epidemiological  and / or clinical management  purposes  to differentiate between  SARS-CoV-2 and other Sarbecovirus currently known to infect humans.  If clinically indicated additional testing with an alternate test  methodology [phone]) is advised. The SARS-CoV-2 RNA is generally  detectable in upper and lower respiratory sp ecimens during the acute  phase of infection. The expected result is Negative. Fact Sheet for Patients:  StrictlyIdeas.no Fact Sheet for Healthcare Providers: BankingDealers.co.za This test is not yet approved or cleared by the [place] FDA and has been authorized for detection and/or diagnosis of SARS-CoV-2 by FDA under an Emergency Use Authorization (EUA).  This EUA will remain in effect (meaning this test can be used) for the duration of the COVID-19 declaration under Section 564(b)(1) of the Act, 21 U.S.C. section 360bbb-3(b)(1), unless the authorization is terminated or revoked sooner. Performed at [facility], [place] [address], [place], [place] [address]   Urine culture     Status: Abnormal   Collection Time: 06/04/18 11:42 AM  Result Value Ref Range Status   Specimen Description URINE, RANDOM  Final   Special Requests   Final    STERILE CONTAINER Performed at [facility], [place] [address] [place], [place] [address]    Culture 80,000 COLONIES/mL PROTEUS MIRABILIS (A)  Final   Report Status 06/06/2018 FINAL  Final   Organism ID, Bacteria PROTEUS MIRABILIS (A)  Final      Susceptibility   Proteus mirabilis - MIC*    AMPICILLIN <=2 SENSITIVE Sensitive     CEFAZOLIN <=4 SENSITIVE Sensitive     CEFTRIAXONE <=1 SENSITIVE Sensitive     CIPROFLOXACIN 2 INTERMEDIATE Intermediate     GENTAMICIN <=1 SENSITIVE Sensitive     IMIPENEM 2 SENSITIVE Sensitive     NITROFURANTOIN 128 RESISTANT Resistant     TRIMETH/SULFA <=20 SENSITIVE Sensitive     AMPICILLIN/SULBACTAM <=2 SENSITIVE Sensitive     PIP/TAZO <=4 SENSITIVE Sensitive     * 80,000  COLONIES/mL PROTEUS MIRABILIS  Blood Culture (routine x 2)     Status: None (Preliminary result)   Collection Time: 06/04/18 11:50 AM  Result Value Ref Range Status   Specimen Description BLOOD RIGHT FOREARM  Final   Special Requests   Final    BOTTLES DRAWN AEROBIC AND ANAEROBIC Blood Culture adequate volume   Culture   Final    NO GROWTH 4 DAYS Performed at [facility], [address], [place],  [address]    Report Status PENDING  Incomplete  Culture, blood (routine x 2)     Status: None (Preliminary result)   Collection Time: 06/05/18  9:28 AM  Result Value Ref Range Status   Specimen Description BLOOD RIGHT HAND  Final   Special Requests   Final    BOTTLES DRAWN AEROBIC ONLY Blood Culture adequate volume   Culture   Final    NO GROWTH 3 DAYS Performed at [facility], [place] [address], [place], [place] [address]    Report Status PENDING  Incomplete  Culture, blood (routine x 2)     Status: None (Preliminary result)   Collection Time: 06/05/18  9:40 AM  Result Value Ref Range Status   Specimen Description BLOOD LEFT ANTECUBITAL  Final   Special Requests   Final    BOTTLES DRAWN AEROBIC AND ANAEROBIC Blood Culture adequate volume   Culture   Final    NO GROWTH 3 DAYS Performed at [facility], [place] [address] [place], [place] [address]    Report Status PENDING  Incomplete  Culture, blood (routine x 2)     Status: None (Preliminary result)   Collection Time: 06/06/18  6:45 AM  Result Value Ref Range Status   Specimen Description BLOOD RIGHT ARM  Final   Special Requests   Final    BOTTLES DRAWN AEROBIC AND ANAEROBIC Blood Culture adequate volume   Culture   Final    NO GROWTH 2 DAYS Performed at [facility], [address], [place], [place] [address]    Report Status PENDING  Incomplete  Culture, blood (routine x 2)     Status: None (Preliminary result)   Collection Time: 06/06/18  7:00 AM  Result Value Ref Range Status   Specimen  Description BLOOD LEFT ANTECUBITAL  Final   Special Requests   Final    BOTTLES DRAWN AEROBIC AND ANAEROBIC Blood Culture adequate volume   Culture   Final    NO GROWTH 2 DAYS Performed at [facility], [place] [address], [place], [place] [address]    Report Status PENDING  Incomplete     Labs: BNP (last 3 results) No results for input(s): BNP in the last 8760 hours. Basic Metabolic Panel: Recent Labs  Lab 06/04/18 1121 06/05/18 0320 06/06/18 0646 06/07/18 0317 06/08/18 0317 06/08/18 1052  NA 144 145 145 144 142  --   K 3.8 2.9* 2.4* 2.9* 2.4* 2.7*  CL 111 116* 111 114* 106  --   CO2 23 21* 21* 19* 21*  --   GLUCOSE 119* 105* 100* 110* 89  --   BUN 25* 15 15 12 11   --   CREATININE 1.16* 0.82 0.67 0.58 0.70  --   CALCIUM 8.9 8.2* 8.5* 8.7* 9.0  --   MG  --   --   --   --  1.6*  --    Liver Function Tests: Recent Labs  Lab 06/04/18 1121 06/05/18 0320  AST 22 22  ALT 16 16  ALKPHOS 66 59  BILITOT 0.5 0.5  PROT 5.9* 5.7*  ALBUMIN 2.8* 2.5*   No results for input(s): LIPASE, AMYLASE in the last 168 hours. No results for input(s): AMMONIA in the last 168 hours. CBC: Recent Labs  Lab 06/04/18 1121 06/05/18 0320 06/06/18 0646 06/07/18 0317 06/08/18 0317  WBC 10.6* 10.3 8.6 7.4 7.6  NEUTROABS 8.6*  --   --   --   --   HGB 11.4* 10.8* 11.3* 11.9* 12.8  HCT 36.8 33.8* 34.9* 36.1 38.8  MCV 97.6 95.5 93.1 90.7 90.2  PLT 163 140* 167 205 200   Cardiac Enzymes: Recent Labs  Lab 06/04/18 1121  TROPONINI 0.04*   BNP: Invalid input(s): POCBNP CBG: No results for input(s): GLUCAP in the last 168 hours. D-Dimer No results for input(s): DDIMER in the last 72 hours. Hgb A1c No results for input(s): HGBA1C in the last 72 hours.  Lipid Profile No results for input(s): CHOL, HDL, LDLCALC, TRIG, CHOLHDL, LDLDIRECT in the last 72 hours. Thyroid function studies No results for input(s): TSH, T4TOTAL, T3FREE, THYROIDAB in the last 72 hours.  Invalid input(s):  FREET3 Anemia work up No results for input(s): VITAMINB12, FOLATE, FERRITIN, TIBC, IRON, RETICCTPCT in the last 72 hours. Urinalysis    Component Value Date/Time   COLORURINE AMBER (A) 06/04/2018 1142   APPEARANCEUR TURBID (A) 06/04/2018 1142   LABSPEC 1.019 06/04/2018 1142   PHURINE 6.0 06/04/2018 1142   GLUCOSEU NEGATIVE 06/04/2018 1142   HGBUR SMALL (A) 06/04/2018 1142   BILIRUBINUR NEGATIVE 06/04/2018 1142   BILIRUBINUR neg 06/20/2013 1736   KETONESUR NEGATIVE 06/04/2018 1142   PROTEINUR 100 (A) 06/04/2018 1142   UROBILINOGEN 0.2 12/25/2014 1620   NITRITE NEGATIVE 06/04/2018 1142   LEUKOCYTESUR MODERATE (A) 06/04/2018 1142   Sepsis Labs Invalid input(s): PROCALCITONIN,  WBC,  LACTICIDVEN Microbiology Recent Results (from the past 240 hour(s))  Blood Culture (routine x 2)     Status: Abnormal   Collection Time: 06/04/18 11:22 AM  Result Value Ref Range Status   Specimen Description BLOOD SITE NOT SPECIFIED  Final   Special Requests   Final    BOTTLES DRAWN AEROBIC AND ANAEROBIC Blood Culture adequate volume   Culture  Setup Time   Final    GRAM POSITIVE COCCI ANAEROBIC BOTTLE ONLY CRITICAL RESULT CALLED TO, READ BACK BY AND VERIFIED WITH: Cristopher Estimable PharmD 9:20 06/05/18 ([email or web address])    Culture (A)  Final    METHICILLIN RESISTANT STAPHYLOCOCCUS AUREUS AEROCOCCUS SPECIES Standardized susceptibility testing for this organism is not available. Performed at [facility], [place] [address] [place], [place] [address]    Report Status 06/07/2018 FINAL  Final   Organism ID, Bacteria METHICILLIN RESISTANT STAPHYLOCOCCUS AUREUS  Final      Susceptibility   Methicillin resistant staphylococcus aureus - MIC*    CIPROFLOXACIN >=8 RESISTANT Resistant     ERYTHROMYCIN >=8 RESISTANT Resistant     GENTAMICIN <=0.5 SENSITIVE Sensitive     OXACILLIN >=4 RESISTANT Resistant     TETRACYCLINE <=1 SENSITIVE Sensitive     VANCOMYCIN <=0.5 SENSITIVE Sensitive     TRIMETH/SULFA >=320  RESISTANT Resistant     CLINDAMYCIN <=0.25 SENSITIVE Sensitive     RIFAMPIN <=0.5 SENSITIVE Sensitive     Inducible Clindamycin NEGATIVE Sensitive     * METHICILLIN RESISTANT STAPHYLOCOCCUS AUREUS  Blood Culture ID Panel (Reflexed)     Status: Abnormal   Collection Time: 06/04/18 11:22 AM  Result Value Ref Range Status   Enterococcus species NOT DETECTED NOT DETECTED Final   Listeria monocytogenes NOT DETECTED NOT DETECTED Final   Staphylococcus species DETECTED (A) NOT DETECTED Final    Comment: CRITICAL RESULT CALLED TO, READ BACK BY AND VERIFIED WITH: Cristopher Estimable PharmD 9:20 06/05/18 ([email or web address])    Staphylococcus aureus (BCID) DETECTED (A) NOT DETECTED Final    Comment: Methicillin (oxacillin)-resistant Staphylococcus aureus (MRSA). MRSA is predictably resistant to beta-lactam antibiotics (except ceftaroline). Preferred therapy is vancomycin unless clinically contraindicated. Patient requires contact precautions if  hospitalized. CRITICAL RESULT CALLED TO, READ BACK BY AND VERIFIED WITH: Cristopher Estimable PharmD 9:20 06/05/18 ([email or web address])    Methicillin resistance DETECTED (A) NOT DETECTED Final    Comment: CRITICAL RESULT CALLED TO, READ BACK BY AND VERIFIED WITH: Cristopher Estimable PharmD 9:20 06/05/18 ([email or web address])    Streptococcus species NOT DETECTED NOT DETECTED Final   Streptococcus agalactiae NOT DETECTED NOT DETECTED Final   Streptococcus pneumoniae NOT DETECTED  NOT DETECTED Final   Streptococcus pyogenes NOT DETECTED NOT DETECTED Final   Acinetobacter baumannii NOT DETECTED NOT DETECTED Final   Enterobacteriaceae species NOT DETECTED NOT DETECTED Final   Enterobacter cloacae complex NOT DETECTED NOT DETECTED Final   Escherichia coli NOT DETECTED NOT DETECTED Final   Klebsiella oxytoca NOT DETECTED NOT DETECTED Final   Klebsiella pneumoniae NOT DETECTED NOT DETECTED Final   Proteus species NOT DETECTED NOT DETECTED Final   Serratia marcescens NOT DETECTED NOT DETECTED Final   Haemophilus  influenzae NOT DETECTED NOT DETECTED Final   Neisseria meningitidis NOT DETECTED NOT DETECTED Final   Pseudomonas aeruginosa NOT DETECTED NOT DETECTED Final   Candida albicans NOT DETECTED NOT DETECTED Final   Candida glabrata NOT DETECTED NOT DETECTED Final   Candida krusei NOT DETECTED NOT DETECTED Final   Candida parapsilosis NOT DETECTED NOT DETECTED Final   Candida tropicalis NOT DETECTED NOT DETECTED Final    Comment: Performed at [facility], [place] [address], [place], [place] [address]  SARS Coronavirus 2 [facility] order, Performed in [facility] hospital lab)     Status: None   Collection Time: 06/04/18 11:25 AM  Result Value Ref Range Status   SARS Coronavirus 2 NEGATIVE NEGATIVE Final    Comment: (NOTE) If result is NEGATIVE SARS-CoV-2 target nucleic acids are NOT DETECTED. The SARS-CoV-2 RNA is generally detectable in upper and lower  respiratory specimens during the acute phase of infection. The lowest  concentration of SARS-CoV-2 viral copies this assay can detect is 250  copies / mL. A negative result does not preclude SARS-CoV-2 infection  and should not be used as the sole basis for treatment or other  patient management decisions.  A negative result may occur with  improper specimen collection / handling, submission of specimen other  than nasopharyngeal swab, presence of viral mutation(s) within the  areas targeted by this assay, and inadequate number of viral copies  (<250 copies / mL). A negative result must be combined with clinical  observations, patient history, and epidemiological information. If result is POSITIVE SARS-CoV-2 target nucleic acids are DETECTED. The SARS-CoV-2 RNA is generally detectable in upper and lower  respiratory specimens dur ing the acute phase of infection.  Positive  results are indicative of active infection with SARS-CoV-2.  Clinical  correlation with patient history and other diagnostic information is  necessary to determine  patient infection status.  Positive results do  not rule out bacterial infection or co-infection with other viruses. If result is PRESUMPTIVE POSTIVE SARS-CoV-2 nucleic acids MAY BE PRESENT.   A presumptive positive result was obtained on the submitted specimen  and confirmed on repeat testing.  While 2019 novel coronavirus  (SARS-CoV-2) nucleic acids may be present in the submitted sample  additional confirmatory testing may be necessary for epidemiological  and / or clinical management purposes  to differentiate between  SARS-CoV-2 and other Sarbecovirus currently known to infect humans.  If clinically indicated additional testing with an alternate test  methodology [phone]) is advised. The SARS-CoV-2 RNA is generally  detectable in upper and lower respiratory sp ecimens during the acute  phase of infection. The expected result is Negative. Fact Sheet for Patients:  StrictlyIdeas.no Fact Sheet for Healthcare Providers: BankingDealers.co.za This test is not yet approved or cleared by the [place] FDA and has been authorized for detection and/or diagnosis of SARS-CoV-2 by FDA under an Emergency Use Authorization (EUA).  This EUA will remain in effect (meaning this test can be used)  for the duration of the COVID-19 declaration under Section 564(b)(1) of the Act, 21 U.S.C. section 360bbb-3(b)(1), unless the authorization is terminated or revoked sooner. Performed at [facility], [place] [address], [place], [place] [address]   Urine culture     Status: Abnormal   Collection Time: 06/04/18 11:42 AM  Result Value Ref Range Status   Specimen Description URINE, RANDOM  Final   Special Requests   Final    STERILE CONTAINER Performed at [facility], [place] [address] [place], [place] [address]    Culture 80,000 COLONIES/mL PROTEUS MIRABILIS (A)  Final   Report Status 06/06/2018 FINAL  Final   Organism ID, Bacteria PROTEUS  MIRABILIS (A)  Final      Susceptibility   Proteus mirabilis - MIC*    AMPICILLIN <=2 SENSITIVE Sensitive     CEFAZOLIN <=4 SENSITIVE Sensitive     CEFTRIAXONE <=1 SENSITIVE Sensitive     CIPROFLOXACIN 2 INTERMEDIATE Intermediate     GENTAMICIN <=1 SENSITIVE Sensitive     IMIPENEM 2 SENSITIVE Sensitive     NITROFURANTOIN 128 RESISTANT Resistant     TRIMETH/SULFA <=20 SENSITIVE Sensitive     AMPICILLIN/SULBACTAM <=2 SENSITIVE Sensitive     PIP/TAZO <=4 SENSITIVE Sensitive     * 80,000 COLONIES/mL PROTEUS MIRABILIS  Blood Culture (routine x 2)     Status: None (Preliminary result)   Collection Time: 06/04/18 11:50 AM  Result Value Ref Range Status   Specimen Description BLOOD RIGHT FOREARM  Final   Special Requests   Final    BOTTLES DRAWN AEROBIC AND ANAEROBIC Blood Culture adequate volume   Culture   Final    NO GROWTH 4 DAYS Performed at [facility], [address] [place], [place] [address]    Report Status PENDING  Incomplete  Culture, blood (routine x 2)     Status: None (Preliminary result)   Collection Time: 06/05/18  9:28 AM  Result Value Ref Range Status   Specimen Description BLOOD RIGHT HAND  Final   Special Requests   Final    BOTTLES DRAWN AEROBIC ONLY Blood Culture adequate volume   Culture   Final    NO GROWTH 3 DAYS Performed at [facility], [place] [address] [place], [place] [address]    Report Status PENDING  Incomplete  Culture, blood (routine x 2)     Status: None (Preliminary result)   Collection Time: 06/05/18  9:40 AM  Result Value Ref Range Status   Specimen Description BLOOD LEFT ANTECUBITAL  Final   Special Requests   Final    BOTTLES DRAWN AEROBIC AND ANAEROBIC Blood Culture adequate volume   Culture   Final    NO GROWTH 3 DAYS Performed at [facility], [place] [address] [place], [place] [address]    Report Status PENDING  Incomplete  Culture, blood (routine x 2)     Status: None (Preliminary result)   Collection Time:  06/06/18  6:45 AM  Result Value Ref Range Status   Specimen Description BLOOD RIGHT ARM  Final   Special Requests   Final    BOTTLES DRAWN AEROBIC AND ANAEROBIC Blood Culture adequate volume   Culture   Final    NO GROWTH 2 DAYS Performed at [facility], [address] [place], [place] [address]    Report Status PENDING  Incomplete  Culture, blood (routine x 2)     Status: None (Preliminary result)   Collection  Time: 06/06/18  7:00 AM  Result Value Ref Range Status   Specimen Description BLOOD LEFT ANTECUBITAL  Final   Special Requests   Final    BOTTLES DRAWN AEROBIC AND ANAEROBIC Blood Culture adequate volume   Culture   Final    NO GROWTH 2 DAYS Performed at [facility], [address] [place], [place] [address]    Report Status PENDING  Incomplete     Time coordinating discharge: 40 minutes      SIGNED:   Edwin Dada, MD  [name] 06/08/2018, 1:12 PM

## 2018-06-09 ENCOUNTER — Encounter: Payer: Self-pay | Admitting: Internal Medicine

## 2018-06-09 ENCOUNTER — Non-Acute Institutional Stay (SKILLED_NURSING_FACILITY): Payer: Medicare Other | Admitting: Internal Medicine

## 2018-06-09 DIAGNOSIS — I951 Orthostatic hypotension: Secondary | ICD-10-CM | POA: Diagnosis not present

## 2018-06-09 DIAGNOSIS — D638 Anemia in other chronic diseases classified elsewhere: Secondary | ICD-10-CM

## 2018-06-09 DIAGNOSIS — F02818 Dementia in other diseases classified elsewhere, unspecified severity, with other behavioral disturbance: Secondary | ICD-10-CM

## 2018-06-09 DIAGNOSIS — G301 Alzheimer's disease with late onset: Secondary | ICD-10-CM | POA: Diagnosis not present

## 2018-06-09 DIAGNOSIS — G9340 Encephalopathy, unspecified: Secondary | ICD-10-CM | POA: Diagnosis not present

## 2018-06-09 DIAGNOSIS — J3089 Other allergic rhinitis: Secondary | ICD-10-CM

## 2018-06-09 DIAGNOSIS — R7881 Bacteremia: Secondary | ICD-10-CM | POA: Diagnosis not present

## 2018-06-09 DIAGNOSIS — N183 Chronic kidney disease, stage 3 unspecified: Secondary | ICD-10-CM

## 2018-06-09 DIAGNOSIS — F0281 Dementia in other diseases classified elsewhere with behavioral disturbance: Secondary | ICD-10-CM | POA: Diagnosis not present

## 2018-06-09 DIAGNOSIS — J309 Allergic rhinitis, unspecified: Secondary | ICD-10-CM | POA: Insufficient documentation

## 2018-06-09 LAB — CULTURE, BLOOD (ROUTINE X 2)
Culture: NO GROWTH
Special Requests: ADEQUATE

## 2018-06-09 NOTE — Progress Notes (Signed)
: Provider:  [name], MD Location:  [place] Room Number: [phone] Place of Service:  SNF ([phone])  PCP: [name], MD Patient Care Team: [name], MD as PCP - General (Internal Medicine) [name], MD (Gastroenterology) [name], NP (Obstetrics and Gynecology)  Extended Emergency Contact Information Primary Emergency Contact: [name],[name] Address: [place], [place] Phone: [phone] Mobile Phone: [phone] Relation: Daughter Secondary Emergency Contact: [name] Phone: [phone] Mobile Phone: [phone] Relation: Daughter     Allergies: Amoxicillin; Ampicillin; Depakote [valproic acid]; Macrobid [nitrofurantoin macrocrystal]; Penicillins; Azithromycin; Doxycycline; Escitalopram oxalate; Fioricet [butalbital-apap-caffeine]; Latex; Levofloxacin; Nsaids; Oxycodone; Restasis [cyclosporine]; Sulfa antibiotics; and Biaxin [clarithromycin]  Chief Complaint  Patient presents with   Readmit To SNF    Readmit to Facility     HPI: Patient is 85 y.o. female with dementia, chronic kidney disease stage III, COPD, anxiety, depression, protein calorie malnutrition, who presented [facility] from nursing facility with altered mental status, fever of 101.4 and blood pressure 73/50.  Patient's blood pressure responded to IV fluids and her initial UTI looks like an infection.  She was initially suspected of COVID-19 but the rapid test was done which was negative.  Patient was admitted to [facility] from 4/20 2-26 where she was ultimately found to have MRSA bacteremia as a cause for her sepsis 1/2 blood cultures being positive for MRSA.  Patient was initially treated with Rocephin and vancomycin which was then changed to vancomycin.  Patient's urine grew out 80,000 of Proteus.  Patient is to be treated with IV  vancomycin for 14 days, end date May 7.  Patient is admitted back to skilled nursing facility for IV vancomycin and residential care.  While at skilled nursing facility patient will be followed for allergic rhinitis treated with Claritin, iron deficiency anemia treated with iron, and orthostatic hypotension treated with Florinef.  Past Medical History:  Diagnosis Date   Anxiety    Arterial tortuosity (aorta) 12/11/2012   Asthma    Chronic kidney disease, stage IV (severe) ([place]) 12/11/2012   Chronic mental illness    CKD (chronic kidney disease), stage III ([place]) 01/14/2015   Closed hip fracture requiring operative repair, right, with routine healing, subsequent encounter 12/19/2015   Colitis    COPD (chronic obstructive pulmonary disease) (HCC)    Decreased rectal sphincter tone 11/21/2012   Depression    Drug-seeking behavior 02/26/2011   Use of multiple md to obtain benzos/narcotics pt must sign contract to receive controlled meds.    Dysphagia 01/11/2015   GERD (gastroesophageal reflux disease)    Glaucoma 06/26/2011   Glaucoma of both eyes    Hearing loss, sensorineural 03/04/2014   [facility]; 02/26/14. There is not a medical or surgical treatment that would benefit her type of hearing loss. We discussed amplification in an overview fashion.     History of fall    Insomnia, unspecified    Macular degeneration 06/26/2011   Migraines    Nasal bone fracture 03/30/2014   Osteoporosis    Osteoporosis 02/26/2011   Presence of right artificial hip joint    Protein-calorie malnutrition, severe ([place]) 12/10/2012   S/P ORIF (open reduction internal fixation) fracture 01/08/2016   Schatzki's ring 01/14/2015   Scoliosis    Substance abuse ([place])    Benbzos and Narcotics, she does not get  any of those medicines from [facility], we have empathetically told her that.   Vertigo    Vitamin B deficiency    Vitamin D deficiency 08/25/2016   Weight loss     Past  Surgical History:  Procedure Laterality Date   ABDOMINAL HYSTERECTOMY     CATARACT EXTRACTION     CHOLECYSTECTOMY     ESOPHAGOGASTRODUODENOSCOPY (EGD) WITH PROPOFOL N/A 01/13/2015   Procedure: ESOPHAGOGASTRODUODENOSCOPY (EGD) WITH PROPOFOL;  Surgeon: [name], MD;  Location: [facility];  Service: Endoscopy;  Laterality: N/A;   HIP ARTHROPLASTY Right 12/21/2015   Procedure: ARTHROPLASTY BIPOLAR HIP (HEMIARTHROPLASTY);  Surgeon: [name], MD;  Location: [place];  Service: Orthopedics;  Laterality: Right;   PARTIAL HYSTERECTOMY      Allergies as of 06/09/2018      Reactions   Amoxicillin    Per daughter   Ampicillin    Per daughter   Depakote [valproic Acid]    DOES NOT WANT HER TO TAKE IT==Makes her hair fall out= per daughter who is poa   Macrobid [nitrofurantoin Macrocrystal] Rash   Per MAR   Penicillins    Per daughter   Azithromycin Other (See Comments)   Per MAR   Doxycycline Other (See Comments)   Per MAR   Escitalopram Oxalate Other (See Comments)   Per MAR   Fioricet [butalbital-apap-caffeine] Other (See Comments)   Drunk. Per MAR   Latex Other (See Comments)   Per MAR   Levofloxacin Other (See Comments)   Per MAR   Nsaids Other (See Comments)   This is not an allergy. The patient was told by Dr. Rockne Menghini to avoid NSAIDs and stop Vicoprofen in to 2014 to avoid nephrotoxicity. Per MAR.   Oxycodone Other (See Comments)   reaction to synthetic codeine Per MAR   Restasis [cyclosporine] Other (See Comments)   Per MAR   Sulfa Antibiotics Other (See Comments)   Per MAR   Biaxin [clarithromycin] Rash   With burning sensation Per [facility]      Medication List       Accurate as of June 09, 2018 10:50 AM. Always use your most recent med list.        acetaminophen 325 MG tablet Commonly known as:  TYLENOL Take 650 mg by mouth every 6 (six) hours as needed for headache.   albuterol 108 (90 Base) MCG/ACT inhaler Commonly known as:  VENTOLIN  HFA Inhale 1 puff into the lungs every 6 (six) hours as needed for shortness of breath.   Biotin 5000 MCG Caps Take 1 capsule by mouth daily.   Claritin 10 MG tablet Generic drug:  loratadine Take 10 mg by mouth daily.   Cranberry 450 MG Tabs Take 1 tablet by mouth 2 (two) times daily.   docusate sodium 100 MG capsule Commonly known as:  COLACE Take 100 mg by mouth 2 (two) times daily.   dorzolamide-timolol 22.3-6.8 MG/ML ophthalmic solution Commonly known as:  COSOPT Place 1 drop into both eyes 2 (two) times daily.   Ensure Take 237 mLs by mouth 4 (four) times daily. D/t weight loss   ferrous sulfate 325 (65 FE) MG tablet Take 325 mg by mouth daily.   fludrocortisone 0.1 MG tablet Commonly known as:  FLORINEF Take 0.1 mg by mouth every morning. for orthostatic hypotension   lactulose 10 GM/15ML solution Commonly known as:  CHRONULAC Take 15 mLs (10 g total) by mouth daily as needed for severe constipation.   mirtazapine 15 MG tablet Commonly  known as:  REMERON Take 7.5 mg by mouth at bedtime.   nitroGLYCERIN 0.4 MG SL tablet Commonly known as:  NITROSTAT Place 0.4 mg under the tongue every 5 (five) minutes as needed for chest pain. May use 3 times   polyethylene glycol 17 g packet Commonly known as:  MIRALAX / GLYCOLAX Take 17 g by mouth daily.   potassium chloride SA 20 MEQ tablet Commonly known as:  K-DUR Take 1 tablet (20 mEq total) by mouth daily.   senna 8.6 MG tablet Commonly known as:  SENOKOT Take 2 tablets by mouth 2 (two) times daily as needed for constipation.   Systane 0.4-0.3 % Soln Generic drug:  Polyethyl Glycol-Propyl Glycol Place 2 drops into both eyes every 2 (two) hours as needed (dry eyes).   travoprost (benzalkonium) 0.004 % ophthalmic solution Commonly known as:  TRAVATAN Place 1 drop into both eyes at bedtime.   vancomycin  IVPB Inject 750 mg into the vein every other day for 10 days. Indication:  MRSA bacteremia Last Day of  Therapy:  06/19/18 Labs - Sunday/Monday:  CBC/D, BMP, and vancomycin trough. Labs - Thursday:  BMP and vancomycin trough Labs - Every other week:  ESR and CRP   vitamin B-12 1000 MCG tablet Commonly known as:  CYANOCOBALAMIN Take 1,500 mcg by mouth daily. gummies   Vitamin D 1000 units capsule Take 1,000 Units by mouth daily with breakfast.       No orders of the defined types were placed in this encounter.   Immunization History  Administered Date(s) Administered   Influenza Split 11/13/2011   Influenza-Unspecified 02/13/2008, 11/30/2013, 12/20/2016   PPD Test 12/15/2015   Pneumococcal-Unspecified 02/13/2008, 10/16/2016   Td 02/12/2009    Social History   Tobacco Use   Smoking status: Former Smoker    Packs/day: 0.50    Types: Cigarettes   Smokeless tobacco: Never Used  Substance Use Topics   Alcohol use: No    Family history is   Family History  Problem Relation Age of Onset   Stroke Mother    Diabetes Mother    Heart disease Mother    Hearing loss Mother    Cancer Father 81       esophageal      Review of Systems unable to obtain secondary to dementia; nursing- no acute concerns      Vitals:   06/09/18 1043  BP: (!) 158/83  Pulse: 75  Resp: 16  Temp: 97.8 F (36.6 C)  SpO2: 98%    SpO2 Readings from Last 1 Encounters:  06/09/18 98%   Body mass index is 15.5 kg/m.     Physical Exam  GENERAL APPEARANCE: Alert, nonconversant conversant,  No acute distress.  SKIN: No diaphoresis rash; 0.2 cm maroon lesion low back and 0.2 lesion left flank HEAD: Normocephalic, atraumatic  EYES: Conjunctiva/lids clear. Pupils round, reactive. EOMs intact.  EARS: External exam WNL, canals clear. Hearing grossly normal.  NOSE: No deformity or discharge.  MOUTH/THROAT: Lips w/o lesions  RESPIRATORY: Breathing is even, unlabored. Lung sounds are clear   CARDIOVASCULAR: Heart RRR no murmurs, rubs or gallops. No peripheral edema.    GASTROINTESTINAL: Abdomen is soft, non-tender, not distended w/ normal bowel sounds. GENITOURINARY: Bladder non tender, not distended  MUSCULOSKELETAL: No abnormal joints or musculature NEUROLOGIC:  Cranial nerves 2-12 grossly intact. Moves all extremities  PSYCHIATRIC: Mood and affect with dementia, no behavioral issues  Patient Active Problem List   Diagnosis Date Noted   Pressure injury of  skin 06/05/2018   MRSA bacteremia 06/04/2018   Chronic generalized pain 05/16/2018   Dry eye syndrome of both eyes 02/28/2018   Abnormal EKG 01/24/2017   Delusions ([place])    Paranoia ([place])    Vitamin D deficiency 08/25/2016   Dementia with behavioral problem ([place]) 03/25/2016   S/P ORIF (open reduction internal fixation) fracture 01/08/2016   Oral thrush 01/08/2016   HTN (hypertension) 01/08/2016   Closed hip fracture requiring operative repair, right, with routine healing, subsequent encounter 12/19/2015   Bipolar disease, chronic ([place]) 12/17/2015   Decubitus ulcer of hip, stage 3 ([place]) 12/11/2015   Legally blind 08/16/2015   Ankle pain    Encephalopathy    Altered mental status 01/26/2015   Nausea and vomiting 01/17/2015   Esophageal dysmotilities    Urinary tract infectious disease    Weakness 01/14/2015   Schatzki's ring 01/14/2015   CKD (chronic kidney disease), stage III ([place]) 01/14/2015   Esophagus disorder    Dysphagia 01/11/2015   Lower urinary tract infectious disease 03/30/2014   Hearing loss, sensorineural 03/04/2014   Nasal lesion 03/04/2014   Constipation 12/12/2012   Hypokalemia 12/11/2012   Orthostatic hypotension 12/11/2012   Arterial tortuosity (aorta) 12/11/2012   Protein-calorie malnutrition, severe ([place]) 12/10/2012   Adult failure to thrive 12/09/2012   Decreased rectal sphincter tone 11/21/2012   Primary open angle glaucoma of both eyes, severe stage 11/11/2012   Idiopathic scoliosis 07/08/2012   Macular atrophy, retinal  06/26/2011   Vision disturbance 06/26/2011   Glaucoma 06/26/2011   Nonexudative senile macular degeneration of retina 06/21/2011   Macula scar of posterior pole 06/21/2011   Status post intraocular lens implant 06/21/2011   Anemia of chronic disease 04/17/2011   Drug-seeking behavior 02/26/2011   Osteoporosis 02/26/2011   Depression    Anxiety    COPD (chronic obstructive pulmonary disease) (HCC)    GERD (gastroesophageal reflux disease)    Migraines    Insomnia, unspecified       Labs reviewed: Basic Metabolic Panel:    Component Value Date/Time   NA 142 06/08/2018 0317   NA 140 07/31/2017   K 2.7 (LL) 06/08/2018 1052   K 5.0 07/31/2017   CL 106 06/08/2018 0317   CO2 21 (L) 06/08/2018 0317   GLUCOSE 89 06/08/2018 0317   BUN 11 06/08/2018 0317   BUN 23 (A) 07/31/2017   CREATININE 0.70 06/08/2018 0317   CREATININE 0.62 07/31/2017   CALCIUM 9.0 06/08/2018 0317   CALCIUM 8.9 07/31/2017   PROT 5.7 (L) 06/05/2018 0320   PROT 5.8 07/31/2017   ALBUMIN 2.5 (L) 06/05/2018 0320   ALBUMIN 3.5 07/31/2017   AST 22 06/05/2018 0320   AST 14 07/31/2017   ALT 16 06/05/2018 0320   ALT 5 07/31/2017   ALKPHOS 59 06/05/2018 0320   ALKPHOS 87 07/31/2017   BILITOT 0.5 06/05/2018 0320   BILITOT 0.3 07/31/2017   GFRNONAA >60 06/08/2018 0317   GFRNONAA 81.51 07/31/2017   GFRNONAA 64 11/09/2013 1135   GFRAA >60 06/08/2018 0317   GFRAA 74 11/09/2013 1135    Recent Labs    06/06/18 0646 06/07/18 0317 06/08/18 0317 06/08/18 1052  NA 145 144 142  --   K 2.4* 2.9* 2.4* 2.7*  CL 111 114* 106  --   CO2 21* 19* 21*  --   GLUCOSE 100* 110* 89  --   BUN 15 12 11   --   CREATININE 0.67 0.58 0.70  --   CALCIUM 8.5* 8.7* 9.0  --  MG  --   --  1.6*  --    Liver Function Tests: Recent Labs    07/31/17 06/04/18 1121 06/05/18 0320  AST 14 22 22   ALT 5 16 16   ALKPHOS 87 66 59  BILITOT 0.3 0.5 0.5  PROT 5.8 5.9* 5.7*  ALBUMIN 3.5 2.8* 2.5*   No results for input(s):  LIPASE, AMYLASE in the last 8760 hours. No results for input(s): AMMONIA in the last 8760 hours. CBC: Recent Labs    06/04/18 1121  06/06/18 0646 06/07/18 0317 06/08/18 0317  WBC 10.6*   < > 8.6 7.4 7.6  NEUTROABS 8.6*  --   --   --   --   HGB 11.4*   < > 11.3* 11.9* 12.8  HCT 36.8   < > 34.9* 36.1 38.8  MCV 97.6   < > 93.1 90.7 90.2  PLT 163   < > 167 205 200   < > = values in this interval not displayed.   Lipid Recent Labs    07/31/17  CHOL 134  LDLCALC 61  TRIG 102    Cardiac Enzymes: Recent Labs    06/04/18 1121  TROPONINI 0.04*   BNP: No results for input(s): BNP in the last 8760 hours. No results found for: [facility] Lab Results  Component Value Date   HGBA1C 5.4 06/11/2016   Lab Results  Component Value Date   TSH 0.59 07/31/2017   No results found for: VITAMINB12 No results found for: FOLATE No results found for: IRON, TIBC, FERRITIN  Imaging and Procedures obtained prior to SNF admission: Dg Chest Portable 1 View  Result Date: 06/04/2018 CLINICAL DATA:  Fever with infusion EXAM: PORTABLE CHEST 1 VIEW COMPARISON:  Chest radiograph December 12, 2016 and chest CT May 14, 2017. FINDINGS: There is underlying emphysematous change, better delineated on prior CT. There is no edema or consolidation. There is mild scarring in the right mid lower lung zones. There is no consolidation or edema. Heart size and pulmonary vascularity are normal. No adenopathy. There is aortic atherosclerosis. Bones are osteoporotic. IMPRESSION: Underlying emphysematous change with areas of mild scarring on the right. No frank edema or consolidation. Heart size within normal limits. There is aortic atherosclerosis. Bones osteoporotic. Aortic Atherosclerosis (ICD10-I70.0) and Emphysema (ICD10-J43.9). Electronically Signed   By: Lowella Grip III M.D.   On: 06/04/2018 12:25     Not all labs, radiology exams or other studies done during hospitalization come through on my EPIC note;  however they are reviewed by me.    Assessment and Plan  MRSA sepsis-presenting with fever tachycardia hypotension, improved with fluids; 1/2 blood cultures positive for MRSA, echo was negative for vegetations, patient to be treated with IV vancomycin for 2 weeks and date of March 7 SNF- admitted for OT/PT, IV vancomycin until May 7 and residential care; patient will be in isolation for 14 days secondary to COVID outbreak  Acute encephalopathy/dementia-patient's Seroquel and Klonopin were DC'd SNF- will restart Seroquel 100 mg p.o. nightly  Chronic kidney disease- no changes reported SNF- we will follow-up BMP  Normocytic anemia-patient said to be at baseline SNF-we will follow-up CBC; continue FeSO4 325 mg p.o. daily  Allergic rhinitis SNF-we will resume Claritin 10 mg p.o. daily  Orthostatic hypotension SNF- continue Florinef 0.1 mg p.o. every morning   Time spent greater than 35 minutes;> 50% of time with patient was spent reviewing records, labs, tests and studies, counseling and developing plan of care  Webb Silversmith D. [name], MD

## 2018-06-10 LAB — CULTURE, BLOOD (ROUTINE X 2)
Culture: NO GROWTH
Culture: NO GROWTH
Special Requests: ADEQUATE
Special Requests: ADEQUATE

## 2018-06-11 LAB — CULTURE, BLOOD (ROUTINE X 2)
Culture: NO GROWTH
Culture: NO GROWTH
Special Requests: ADEQUATE
Special Requests: ADEQUATE

## 2018-06-13 ENCOUNTER — Other Ambulatory Visit (HOSPITAL_COMMUNITY): Payer: Self-pay | Admitting: *Deleted

## 2018-06-13 DIAGNOSIS — R1310 Dysphagia, unspecified: Secondary | ICD-10-CM

## 2018-06-18 LAB — BASIC METABOLIC PANEL
BUN: 17 (ref 4–21)
Creatinine: 0.4 — AB (ref 0.5–1.1)
Glucose: 86
Potassium: 3.7 (ref 3.4–5.3)
Sodium: 141 (ref 137–147)

## 2018-06-19 ENCOUNTER — Other Ambulatory Visit (HOSPITAL_COMMUNITY): Payer: Self-pay | Admitting: *Deleted

## 2018-06-19 DIAGNOSIS — R1310 Dysphagia, unspecified: Secondary | ICD-10-CM

## 2018-06-20 DIAGNOSIS — F329 Major depressive disorder, single episode, unspecified: Secondary | ICD-10-CM | POA: Diagnosis not present

## 2018-06-20 DIAGNOSIS — F39 Unspecified mood [affective] disorder: Secondary | ICD-10-CM | POA: Diagnosis not present

## 2018-06-20 DIAGNOSIS — F0391 Unspecified dementia with behavioral disturbance: Secondary | ICD-10-CM | POA: Diagnosis not present

## 2018-06-20 DIAGNOSIS — F419 Anxiety disorder, unspecified: Secondary | ICD-10-CM | POA: Diagnosis not present

## 2018-06-20 LAB — BASIC METABOLIC PANEL: Creatinine: 0.6 (ref 0.5–1.1)

## 2018-06-23 LAB — CBC AND DIFFERENTIAL
HCT: 35 — AB (ref 36–46)
Hemoglobin: 12.1 (ref 12.0–16.0)
Neutrophils Absolute: 3
Platelets: 310 (ref 150–399)
WBC: 5.3

## 2018-06-23 LAB — BASIC METABOLIC PANEL
BUN: 11 (ref 4–21)
Creatinine: 0.5 (ref 0.5–1.1)
Glucose: 104
Potassium: 3.4 (ref 3.4–5.3)
Sodium: 143 (ref 137–147)

## 2018-06-27 ENCOUNTER — Non-Acute Institutional Stay (SKILLED_NURSING_FACILITY): Payer: Medicare Other | Admitting: Internal Medicine

## 2018-06-27 DIAGNOSIS — L309 Dermatitis, unspecified: Secondary | ICD-10-CM

## 2018-06-28 ENCOUNTER — Encounter: Payer: Self-pay | Admitting: Internal Medicine

## 2018-06-28 NOTE — Progress Notes (Signed)
Location:   [facility] Service:   SNF  [name], [name], MD  Patient Care Team: [name], MD as PCP - General (Internal Medicine) [name], MD (Gastroenterology) [name], NP (Obstetrics and Gynecology)  Extended Emergency Contact Information Primary Emergency Contact: [name],[name] Address: [place], [place] Phone: [phone] Mobile Phone: [phone] Relation: Daughter Secondary Emergency Contact: [name] Phone: [phone] Mobile Phone: [phone] Relation: Daughter    Allergies: Amoxicillin; Ampicillin; Depakote [valproic acid]; Macrobid [nitrofurantoin macrocrystal]; Penicillins; Azithromycin; Doxycycline; Escitalopram oxalate; Fioricet [butalbital-apap-caffeine]; Latex; Levofloxacin; Nsaids; Oxycodone; Restasis [cyclosporine]; Sulfa antibiotics; and Biaxin [clarithromycin]  Chief Complaint  Patient presents with  . Acute Visit    HPI: Patient is 85 y.o. female who nursing asked me to see because she has a rash around her mouth.  Patient has had this rash prior.  There is a dry rash, occasionally with papules or pustules.  Patient has a habit of rubbing her mouth and around her nose frequently.  Past Medical History:  Diagnosis Date  . Anxiety   . Arterial tortuosity (aorta) 12/11/2012  . Asthma   . Chronic kidney disease, stage IV (severe) ([place]) 12/11/2012  . Chronic mental illness   . CKD (chronic kidney disease), stage III ([place]) 01/14/2015  . Closed hip fracture requiring operative repair, right, with routine healing, subsequent encounter 12/19/2015  . Colitis   . COPD (chronic obstructive pulmonary disease) ([place])   . Decreased rectal sphincter tone 11/21/2012  . Depression   . Drug-seeking behavior 02/26/2011   Use of multiple md to obtain benzos/narcotics pt must sign contract to receive controlled meds.   Marland Kitchen Dysphagia  01/11/2015  . GERD (gastroesophageal reflux disease)   . Glaucoma 06/26/2011  . Glaucoma of both eyes   . Hearing loss, sensorineural 03/04/2014   [facility]; 02/26/14. There is not a medical or surgical treatment that would benefit her type of hearing loss. We discussed amplification in an overview fashion.    Marland Kitchen History of fall   . Insomnia, unspecified   . Macular degeneration 06/26/2011  . Migraines   . Nasal bone fracture 03/30/2014  . Osteoporosis   . Osteoporosis 02/26/2011  . Presence of right artificial hip joint   . Protein-calorie malnutrition, severe ([place]) 12/10/2012  . S/P ORIF (open reduction internal fixation) fracture 01/08/2016  . Schatzki's ring 01/14/2015  . Scoliosis   . Substance abuse ([place])    Benbzos and Narcotics, she does not get any of those medicines from [facility], we have empathetically told her that.  . Vertigo   . Vitamin B deficiency   . Vitamin D deficiency 08/25/2016  . Weight loss     Past Surgical History:  Procedure Laterality Date  . ABDOMINAL HYSTERECTOMY    . CATARACT EXTRACTION    . CHOLECYSTECTOMY    . ESOPHAGOGASTRODUODENOSCOPY (EGD) WITH PROPOFOL N/A 01/13/2015   Procedure: ESOPHAGOGASTRODUODENOSCOPY (EGD) WITH PROPOFOL;  Surgeon: [name], MD;  Location: [facility];  Service: Endoscopy;  Laterality: N/A;  . HIP ARTHROPLASTY Right 12/21/2015   Procedure: ARTHROPLASTY BIPOLAR HIP (HEMIARTHROPLASTY);  Surgeon: [name], MD;  Location: [facility];  Service: Orthopedics;  Laterality: Right;  . PARTIAL HYSTERECTOMY      Allergies as of 06/27/2018      Reactions   Amoxicillin    Per daughter   Ampicillin    Per daughter  Depakote [valproic Acid]    DOES NOT WANT HER TO TAKE IT==Makes her hair fall out= per daughter who is poa   Macrobid [nitrofurantoin Macrocrystal] Rash   Per MAR   Penicillins    Per daughter   Azithromycin Other (See Comments)   Per MAR   Doxycycline Other (See Comments)   Per MAR   Escitalopram Oxalate  Other (See Comments)   Per MAR   Fioricet [butalbital-apap-caffeine] Other (See Comments)   Drunk. Per MAR   Latex Other (See Comments)   Per MAR   Levofloxacin Other (See Comments)   Per MAR   Nsaids Other (See Comments)   This is not an allergy. The patient was told by Dr. Rockne Menghini to avoid NSAIDs and stop Vicoprofen in to 2014 to avoid nephrotoxicity. Per MAR.   Oxycodone Other (See Comments)   reaction to synthetic codeine Per MAR   Restasis [cyclosporine] Other (See Comments)   Per MAR   Sulfa Antibiotics Other (See Comments)   Per MAR   Biaxin [clarithromycin] Rash   With burning sensation Per [facility]      Medication List       Accurate as of Jun 27, 2018 11:59 PM. If you have any questions, ask your nurse or doctor.        acetaminophen 325 MG tablet Commonly known as:  TYLENOL Take 650 mg by mouth every 6 (six) hours as needed for headache.   albuterol 108 (90 Base) MCG/ACT inhaler Commonly known as:  VENTOLIN HFA Inhale 1 puff into the lungs every 6 (six) hours as needed for shortness of breath.   Biotin 5000 MCG Caps Take 1 capsule by mouth daily.   Claritin 10 MG tablet Generic drug:  loratadine Take 10 mg by mouth daily.   Cranberry 450 MG Tabs Take 1 tablet by mouth 2 (two) times daily.   docusate sodium 100 MG capsule Commonly known as:  COLACE Take 100 mg by mouth 2 (two) times daily.   dorzolamide-timolol 22.3-6.8 MG/ML ophthalmic solution Commonly known as:  COSOPT Place 1 drop into both eyes 2 (two) times daily.   Ensure Take 237 mLs by mouth 4 (four) times daily. D/t weight loss   ferrous sulfate 325 (65 FE) MG tablet Take 325 mg by mouth daily.   fludrocortisone 0.1 MG tablet Commonly known as:  FLORINEF Take 0.1 mg by mouth every morning. for orthostatic hypotension   lactulose 10 GM/15ML solution Commonly known as:  CHRONULAC Take 15 mLs (10 g total) by mouth daily as needed for severe constipation.   mirtazapine 15 MG tablet  Commonly known as:  REMERON Take 7.5 mg by mouth at bedtime.   nitroGLYCERIN 0.4 MG SL tablet Commonly known as:  NITROSTAT Place 0.4 mg under the tongue every 5 (five) minutes as needed for chest pain. May use 3 times   polyethylene glycol 17 g packet Commonly known as:  MIRALAX / GLYCOLAX Take 17 g by mouth daily.   potassium chloride SA 20 MEQ tablet Commonly known as:  K-DUR Take 1 tablet (20 mEq total) by mouth daily.   senna 8.6 MG tablet Commonly known as:  SENOKOT Take 2 tablets by mouth 2 (two) times daily as needed for constipation.   Systane 0.4-0.3 % Soln Generic drug:  Polyethyl Glycol-Propyl Glycol Place 2 drops into both eyes every 2 (two) hours as needed (dry eyes).   travoprost (benzalkonium) 0.004 % ophthalmic solution Commonly known as:  TRAVATAN Place 1 drop into both eyes at  bedtime.   vitamin B-12 1000 MCG tablet Commonly known as:  CYANOCOBALAMIN Take 1,500 mcg by mouth daily. gummies   Vitamin D 1000 units capsule Take 1,000 Units by mouth daily with breakfast.       No orders of the defined types were placed in this encounter.   Immunization History  Administered Date(s) Administered  . Influenza Split 11/13/2011  . Influenza-Unspecified 02/13/2008, 11/30/2013, 12/20/2016  . PPD Test 12/15/2015  . Pneumococcal-Unspecified 02/13/2008, 10/16/2016  . Td 02/12/2009    Social History   Tobacco Use  . Smoking status: Former Smoker    Packs/day: 0.50    Types: Cigarettes  . Smokeless tobacco: Never Used  Substance Use Topics  . Alcohol use: No    Review of Systems  DATA OBTAINED: from nurse GENERAL:  no fevers, fatigue, appetite changes SKIN: No itching, rash HEENT: No complaint RESPIRATORY: No cough, wheezing, SOB CARDIAC: No chest pain, palpitations, lower extremity edema  GI: No abdominal pain, No N/V/D or constipation, No heartburn or reflux  GU: No dysuria, frequency or urgency, or incontinence  MUSCULOSKELETAL: No  unrelieved bone/joint pain NEUROLOGIC: No headache, dizziness  PSYCHIATRIC: No overt anxiety or sadness  Vitals:   06/28/18 2238  BP: 137/86  Pulse: 69  Resp: 18  Temp: (!) 96.9 F (36.1 C)   Body mass index is 15.43 kg/m. Physical Exam  GENERAL APPEARANCE: Alert, conversant, No acute distress  SKIN:- Skin around mouth with dryness with very very very very fine scale and 1 papule with a roof; similar dryness around both nares HEENT: Unremarkable RESPIRATORY: Breathing is even, unlabored. Lung sounds are clear   CARDIOVASCULAR: Heart RRR no murmurs, rubs or gallops. No peripheral edema  GASTROINTESTINAL: Abdomen is soft, non-tender, not distended w/ normal bowel sounds.  GENITOURINARY: Bladder non tender, not distended  MUSCULOSKELETAL: No abnormal joints or musculature except very thin NEUROLOGIC: Cranial nerves 2-12 grossly intact. Moves all extremities PSYCHIATRIC: Mood and affect appropriate with dementia, no behavioral issues  Patient Active Problem List   Diagnosis Date Noted  . Allergic rhinitis 06/09/2018  . Pressure injury of skin 06/05/2018  . MRSA bacteremia 06/04/2018  . Chronic generalized pain 05/16/2018  . Dry eye syndrome of both eyes 02/28/2018  . Abnormal EKG 01/24/2017  . Delusions ([place])   . Paranoia ([place])   . Vitamin D deficiency 08/25/2016  . Dementia with behavioral problem ([place]) 03/25/2016  . S/P ORIF (open reduction internal fixation) fracture 01/08/2016  . Oral thrush 01/08/2016  . HTN (hypertension) 01/08/2016  . Closed hip fracture requiring operative repair, right, with routine healing, subsequent encounter 12/19/2015  . Bipolar disease, chronic ([place]) 12/17/2015  . Decubitus ulcer of hip, stage 3 ([place]) 12/11/2015  . Legally blind 08/16/2015  . Ankle pain   . Encephalopathy   . Altered mental status 01/26/2015  . Nausea and vomiting 01/17/2015  . Esophageal dysmotilities   . Urinary tract infectious disease   . Weakness 01/14/2015  .  Schatzki's ring 01/14/2015  . CKD (chronic kidney disease), stage III ([place]) 01/14/2015  . Esophagus disorder   . Dysphagia 01/11/2015  . Lower urinary tract infectious disease 03/30/2014  . Hearing loss, sensorineural 03/04/2014  . Nasal lesion 03/04/2014  . Constipation 12/12/2012  . Hypokalemia 12/11/2012  . Orthostatic hypotension 12/11/2012  . Arterial tortuosity (aorta) 12/11/2012  . Protein-calorie malnutrition, severe ([place]) 12/10/2012  . Adult failure to thrive 12/09/2012  . Decreased rectal sphincter tone 11/21/2012  . Primary open angle glaucoma of both eyes, severe stage  11/11/2012  . Idiopathic scoliosis 07/08/2012  . Macular atrophy, retinal 06/26/2011  . Vision disturbance 06/26/2011  . Glaucoma 06/26/2011  . Nonexudative senile macular degeneration of retina 06/21/2011  . Macula scar of posterior pole 06/21/2011  . Status post intraocular lens implant 06/21/2011  . Anemia of chronic disease 04/17/2011  . Drug-seeking behavior 02/26/2011  . Osteoporosis 02/26/2011  . Depression   . Anxiety   . COPD (chronic obstructive pulmonary disease) ([place])   . GERD (gastroesophageal reflux disease)   . Migraines   . Insomnia, unspecified     CMP     Component Value Date/Time   NA 141 06/18/2018   NA 140 07/31/2017   K 3.7 06/18/2018   K 5.0 07/31/2017   CL 106 06/08/2018 0317   CO2 21 (L) 06/08/2018 0317   GLUCOSE 89 06/08/2018 0317   BUN 17 06/18/2018   CREATININE 0.6 06/20/2018   CREATININE 0.70 06/08/2018 0317   CREATININE 0.62 07/31/2017   CALCIUM 9.0 06/08/2018 0317   CALCIUM 8.9 07/31/2017   PROT 5.7 (L) 06/05/2018 0320   PROT 5.8 07/31/2017   ALBUMIN 2.5 (L) 06/05/2018 0320   ALBUMIN 3.5 07/31/2017   AST 22 06/05/2018 0320   AST 14 07/31/2017   ALT 16 06/05/2018 0320   ALT 5 07/31/2017   ALKPHOS 59 06/05/2018 0320   ALKPHOS 87 07/31/2017   BILITOT 0.5 06/05/2018 0320   BILITOT 0.3 07/31/2017   GFRNONAA >60 06/08/2018 0317   GFRNONAA 81.51  07/31/2017   GFRNONAA 64 11/09/2013 1135   GFRAA >60 06/08/2018 0317   GFRAA 74 11/09/2013 1135   Recent Labs    06/06/18 0646 06/07/18 0317 06/08/18 0317 06/08/18 1052 06/18/18 06/20/18  NA 145 144 142  --  141  --   K 2.4* 2.9* 2.4* 2.7* 3.7  --   CL 111 114* 106  --   --   --   CO2 21* 19* 21*  --   --   --   GLUCOSE 100* 110* 89  --   --   --   BUN 15 12 11   --  17  --   CREATININE 0.67 0.58 0.70  --  0.4* 0.6  CALCIUM 8.5* 8.7* 9.0  --   --   --   MG  --   --  1.6*  --   --   --    Recent Labs    07/31/17 06/04/18 1121 06/05/18 0320  AST 14 22 22   ALT 5 16 16   ALKPHOS 87 66 59  BILITOT 0.3 0.5 0.5  PROT 5.8 5.9* 5.7*  ALBUMIN 3.5 2.8* 2.5*   Recent Labs    06/04/18 1121  06/06/18 0646 06/07/18 0317 06/08/18 0317  WBC 10.6*   < > 8.6 7.4 7.6  NEUTROABS 8.6*  --   --   --   --   HGB 11.4*   < > 11.3* 11.9* 12.8  HCT 36.8   < > 34.9* 36.1 38.8  MCV 97.6   < > 93.1 90.7 90.2  PLT 163   < > 167 205 200   < > = values in this interval not displayed.   Recent Labs    07/31/17  CHOL 134  LDLCALC 61  TRIG 102   No results found for: [facility] Lab Results  Component Value Date   TSH 0.59 07/31/2017   Lab Results  Component Value Date   HGBA1C 5.4 06/11/2016   Lab Results  Component Value Date  CHOL 134 07/31/2017   HDL 54 06/11/2016   LDLCALC 61 07/31/2017   TRIG 102 07/31/2017    Significant Diagnostic Results in last 30 days:  Dg Chest Portable 1 View  Result Date: 06/04/2018 CLINICAL DATA:  Fever with infusion EXAM: PORTABLE CHEST 1 VIEW COMPARISON:  Chest radiograph December 12, 2016 and chest CT May 14, 2017. FINDINGS: There is underlying emphysematous change, better delineated on prior CT. There is no edema or consolidation. There is mild scarring in the right mid lower lung zones. There is no consolidation or edema. Heart size and pulmonary vascularity are normal. No adenopathy. There is aortic atherosclerosis. Bones are osteoporotic.  IMPRESSION: Underlying emphysematous change with areas of mild scarring on the right. No frank edema or consolidation. Heart size within normal limits. There is aortic atherosclerosis. Bones osteoporotic. Aortic Atherosclerosis (ICD10-I70.0) and Emphysema (ICD10-J43.9). Electronically Signed   By: Lowella Grip III M.D.   On: 06/04/2018 12:25   [place] Ekg Site Rite  Result Date: 06/07/2018 If Site Rite image not attached, placement could not be confirmed due to current cardiac rhythm.   Assessment and Plan  Eczematoid rash around mouth- last time patient did well with triple antibiotic ointment so we will start that again once daily; monitor response     Inocencio Homes, MD

## 2018-07-10 ENCOUNTER — Encounter: Payer: Self-pay | Admitting: Internal Medicine

## 2018-07-10 ENCOUNTER — Non-Acute Institutional Stay (SKILLED_NURSING_FACILITY): Payer: Medicare Other | Admitting: Internal Medicine

## 2018-07-10 DIAGNOSIS — I10 Essential (primary) hypertension: Secondary | ICD-10-CM | POA: Diagnosis not present

## 2018-07-10 DIAGNOSIS — F32A Depression, unspecified: Secondary | ICD-10-CM

## 2018-07-10 DIAGNOSIS — K219 Gastro-esophageal reflux disease without esophagitis: Secondary | ICD-10-CM

## 2018-07-10 DIAGNOSIS — F329 Major depressive disorder, single episode, unspecified: Secondary | ICD-10-CM

## 2018-07-10 NOTE — Progress Notes (Signed)
Location:  [place] Room Number: 304/D Place of Service:  SNF (31)  [name], MD  Patient Care Team: [name], MD as PCP - General (Internal Medicine) [name], MD (Gastroenterology) [name], NP (Obstetrics and Gynecology)  Extended Emergency Contact Information Primary Emergency Contact: [name],[name] Address: [address], [place] Phone: [phone] Mobile Phone: [phone] Relation: Daughter Secondary Emergency Contact: [name] Phone: [phone] Mobile Phone: [phone] Relation: Daughter    Allergies: Amoxicillin; Ampicillin; Depakote [valproic acid]; Macrobid [nitrofurantoin macrocrystal]; Penicillins; Azithromycin; Doxycycline; Escitalopram oxalate; Fioricet [butalbital-apap-caffeine]; Latex; Levofloxacin; Nsaids; Oxycodone; Restasis [cyclosporine]; Sulfa antibiotics; and Biaxin [clarithromycin]  Chief Complaint  Patient presents with   Medical Management of Chronic Issues    Routine visit of medical management    HPI: Patient is 85 y.o. female who is being seen for routine issues of hypertension, GERD, and depression.  Past Medical History:  Diagnosis Date   Anxiety    Arterial tortuosity (aorta) 12/11/2012   Asthma    Chronic kidney disease, stage IV (severe) ([place]) 12/11/2012   Chronic mental illness    CKD (chronic kidney disease), stage III ([place]) 01/14/2015   Closed hip fracture requiring operative repair, right, with routine healing, subsequent encounter 12/19/2015   Colitis    COPD (chronic obstructive pulmonary disease) (HCC)    Decreased rectal sphincter tone 11/21/2012   Depression    Drug-seeking behavior 02/26/2011   Use of multiple md to obtain benzos/narcotics pt must sign contract to receive controlled meds.    Dysphagia 01/11/2015   GERD (gastroesophageal reflux  disease)    Glaucoma 06/26/2011   Glaucoma of both eyes    Hearing loss, sensorineural 03/04/2014   [facility]; 02/26/14. There is not a medical or surgical treatment that would benefit her type of hearing loss. We discussed amplification in an overview fashion.     History of fall    Insomnia, unspecified    Macular degeneration 06/26/2011   Migraines    Nasal bone fracture 03/30/2014   Osteoporosis    Osteoporosis 02/26/2011   Presence of right artificial hip joint    Protein-calorie malnutrition, severe ([place]) 12/10/2012   S/P ORIF (open reduction internal fixation) fracture 01/08/2016   Schatzki's ring 01/14/2015   Scoliosis    Substance abuse ([place])    Benbzos and Narcotics, she does not get any of those medicines from [facility], we have empathetically told her that.   Vertigo    Vitamin B deficiency    Vitamin D deficiency 08/25/2016   Weight loss     Past Surgical History:  Procedure Laterality Date   ABDOMINAL HYSTERECTOMY     CATARACT EXTRACTION     CHOLECYSTECTOMY     ESOPHAGOGASTRODUODENOSCOPY (EGD) WITH PROPOFOL N/A 01/13/2015   Procedure: ESOPHAGOGASTRODUODENOSCOPY (EGD) WITH PROPOFOL;  Surgeon: [name], MD;  Location: [facility];  Service: Endoscopy;  Laterality: N/A;   HIP ARTHROPLASTY Right 12/21/2015   Procedure: ARTHROPLASTY BIPOLAR HIP (HEMIARTHROPLASTY);  Surgeon: [name], MD;  Location: [place];  Service: Orthopedics;  Laterality: Right;   PARTIAL HYSTERECTOMY      Allergies as of 07/10/2018      Reactions   Amoxicillin    Per daughter   Ampicillin    Per daughter   Depakote [valproic Acid]    DOES NOT WANT HER TO TAKE IT==Makes her hair fall  out= per daughter who is poa   Macrobid [nitrofurantoin Macrocrystal] Rash   Per MAR   Penicillins    Per daughter   Azithromycin Other (See Comments)   Per MAR   Doxycycline Other (See Comments)   Per MAR   Escitalopram Oxalate Other (See Comments)   Per MAR   Fioricet  [butalbital-apap-caffeine] Other (See Comments)   Drunk. Per MAR   Latex Other (See Comments)   Per MAR   Levofloxacin Other (See Comments)   Per MAR   Nsaids Other (See Comments)   This is not an allergy. The patient was told by Dr. Rockne Menghini to avoid NSAIDs and stop Vicoprofen in to 2014 to avoid nephrotoxicity. Per MAR.   Oxycodone Other (See Comments)   reaction to synthetic codeine Per MAR   Restasis [cyclosporine] Other (See Comments)   Per MAR   Sulfa Antibiotics Other (See Comments)   Per MAR   Biaxin [clarithromycin] Rash   With burning sensation Per [facility]      Medication List       Accurate as of Jul 10, 2018 11:59 PM. If you have any questions, ask your nurse or doctor.        acetaminophen 325 MG tablet Commonly known as:  TYLENOL Take 650 mg by mouth every 6 (six) hours as needed for headache.   acetaminophen 500 MG tablet Commonly known as:  TYLENOL Give 2 tablets ( 1,000 mg total ) by mouth nightly for pain   albuterol 108 (90 Base) MCG/ACT inhaler Commonly known as:  VENTOLIN HFA Inhale 1 puff into the lungs every 6 (six) hours as needed for shortness of breath.   Biotin 5000 MCG Caps Take 1 capsule by mouth daily.   Claritin 10 MG tablet Generic drug:  loratadine Take 10 mg by mouth daily.   Cranberry 450 MG Tabs Take 1 tablet by mouth 2 (two) times daily.   docusate sodium 100 MG capsule Commonly known as:  COLACE Take 100 mg by mouth 2 (two) times daily.   dorzolamide-timolol 22.3-6.8 MG/ML ophthalmic solution Commonly known as:  COSOPT Place 1 drop into both eyes 2 (two) times daily.   Ensure Take 237 mLs by mouth 4 (four) times daily. D/t weight loss   ferrous sulfate 325 (65 FE) MG tablet Take 325 mg by mouth daily.   fludrocortisone 0.1 MG tablet Commonly known as:  FLORINEF Take 0.1 mg by mouth every morning. for orthostatic hypotension   lactulose 10 GM/15ML solution Commonly known as:  CHRONULAC Take 15 mLs (10 g total) by  mouth daily as needed for severe constipation.   mirtazapine 15 MG tablet Commonly known as:  REMERON Take 7.5 mg by mouth at bedtime.   neomycin-bacitracin-polymyxin [id number] ointment TRIPLE ANTIBIOTIC OINTMENT APPLY AROUND MOUTH AND NOSE ONCE DAILY   nitroGLYCERIN 0.4 MG SL tablet Commonly known as:  NITROSTAT Place 0.4 mg under the tongue every 5 (five) minutes as needed for chest pain. May use 3 times   polyethylene glycol 17 g packet Commonly known as:  MIRALAX / GLYCOLAX Take 17 g by mouth daily.   potassium chloride SA 20 MEQ tablet Commonly known as:  K-DUR Take 1 tablet (20 mEq total) by mouth daily.   QUEtiapine 100 MG tablet Commonly known as:  SEROQUEL Take 100 mg by mouth at bedtime.   senna 8.6 MG tablet Commonly known as:  SENOKOT Take 2 tablets by mouth 2 (two) times daily as needed for constipation.   Systane 0.4-0.3 %  Soln Generic drug:  Polyethyl Glycol-Propyl Glycol Place 2 drops into both eyes every 2 (two) hours as needed (dry eyes).   travoprost (benzalkonium) 0.004 % ophthalmic solution Commonly known as:  TRAVATAN Place 1 drop into both eyes at bedtime.   vitamin B-12 1000 MCG tablet Commonly known as:  CYANOCOBALAMIN Take 1,500 mcg by mouth daily. gummies   Vitamin D 1000 units capsule Take 1,000 Units by mouth daily with breakfast.       No orders of the defined types were placed in this encounter.   Immunization History  Administered Date(s) Administered   Influenza Split 11/13/2011   Influenza-Unspecified 02/13/2008, 11/30/2013, 12/20/2016   PPD Test 12/15/2015   Pneumococcal-Unspecified 02/13/2008, 10/16/2016   Td 02/12/2009    Social History   Tobacco Use   Smoking status: Former Smoker    Packs/day: 0.50    Types: Cigarettes   Smokeless tobacco: Never Used  Substance Use Topics   Alcohol use: No    Review of Systems  DATA OBTAINED: from patient, nurse GENERAL:  no fevers, fatigue, appetite  changes SKIN: No itching, rash HEENT: No complaint RESPIRATORY: No cough, wheezing, SOB CARDIAC: No chest pain, palpitations, lower extremity edema  GI: No abdominal pain, No N/V/D or constipation, No heartburn or reflux  GU: No dysuria, frequency or urgency, or incontinence  MUSCULOSKELETAL: No unrelieved bone/joint pain NEUROLOGIC: No headache, dizziness  PSYCHIATRIC: No overt anxiety or sadness  Vitals:   07/10/18 0826  BP: 124/60  Pulse: 68  Resp: 18  Temp: (!) 97 F (36.1 C)  SpO2: 96%   Body mass index is 15.96 kg/m. Physical Exam  GENERAL APPEARANCE: Alert, conversant, No acute distress  SKIN: No diaphoresis rash HEENT: Unremarkable RESPIRATORY: Breathing is even, unlabored. Lung sounds are clear   CARDIOVASCULAR: Moderately heart RRR no murmurs, rubs or gallops. No peripheral edema  GASTROINTESTINAL: Abdomen is soft, non-tender, not distended w/ normal bowel sounds.  GENITOURINARY: Bladder non tender, not distended  MUSCULOSKELETAL: No abnormal joints or musculature NEUROLOGIC: Cranial nerves 2-12 grossly intact. Moves all extremities PSYCHIATRIC: Mood and affect appropriate to situation, no behavioral issues  Patient Active Problem List   Diagnosis Date Noted   Allergic rhinitis 06/09/2018   Pressure injury of skin 06/05/2018   MRSA bacteremia 06/04/2018   Chronic generalized pain 05/16/2018   Dry eye syndrome of both eyes 02/28/2018   Abnormal EKG 01/24/2017   Delusions ([place])    Paranoia ([place])    Vitamin D deficiency 08/25/2016   Dementia with behavioral problem ([place]) 03/25/2016   S/P ORIF (open reduction internal fixation) fracture 01/08/2016   Oral thrush 01/08/2016   HTN (hypertension) 01/08/2016   Closed hip fracture requiring operative repair, right, with routine healing, subsequent encounter 12/19/2015   Bipolar disease, chronic ([place]) 12/17/2015   Decubitus ulcer of hip, stage 3 ([place]) 12/11/2015   Legally blind 08/16/2015   Ankle  pain    Encephalopathy    Altered mental status 01/26/2015   Nausea and vomiting 01/17/2015   Esophageal dysmotilities    Urinary tract infectious disease    Weakness 01/14/2015   Schatzki's ring 01/14/2015   CKD (chronic kidney disease), stage III ([place]) 01/14/2015   Esophagus disorder    Dysphagia 01/11/2015   Lower urinary tract infectious disease 03/30/2014   Hearing loss, sensorineural 03/04/2014   Nasal lesion 03/04/2014   Constipation 12/12/2012   Hypokalemia 12/11/2012   Orthostatic hypotension 12/11/2012   Arterial tortuosity (aorta) 12/11/2012   Protein-calorie malnutrition, severe ([place]) 12/10/2012  Adult failure to thrive 12/09/2012   Decreased rectal sphincter tone 11/21/2012   Primary open angle glaucoma of both eyes, severe stage 11/11/2012   Idiopathic scoliosis 07/08/2012   Macular atrophy, retinal 06/26/2011   Vision disturbance 06/26/2011   Glaucoma 06/26/2011   Nonexudative senile macular degeneration of retina 06/21/2011   Macula scar of posterior pole 06/21/2011   Status post intraocular lens implant 06/21/2011   Anemia of chronic disease 04/17/2011   Drug-seeking behavior 02/26/2011   Osteoporosis 02/26/2011   Depression    Anxiety    COPD (chronic obstructive pulmonary disease) (HCC)    GERD (gastroesophageal reflux disease)    Migraines    Insomnia, unspecified     CMP     Component Value Date/Time   NA 141 06/18/2018   NA 140 07/31/2017   K 3.7 06/18/2018   K 5.0 07/31/2017   CL 106 06/08/2018 0317   CO2 21 (L) 06/08/2018 0317   GLUCOSE 89 06/08/2018 0317   BUN 17 06/18/2018   CREATININE 0.6 06/20/2018   CREATININE 0.70 06/08/2018 0317   CREATININE 0.62 07/31/2017   CALCIUM 9.0 06/08/2018 0317   CALCIUM 8.9 07/31/2017   PROT 5.7 (L) 06/05/2018 0320   PROT 5.8 07/31/2017   ALBUMIN 2.5 (L) 06/05/2018 0320   ALBUMIN 3.5 07/31/2017   AST 22 06/05/2018 0320   AST 14 07/31/2017   ALT 16 06/05/2018  0320   ALT 5 07/31/2017   ALKPHOS 59 06/05/2018 0320   ALKPHOS 87 07/31/2017   BILITOT 0.5 06/05/2018 0320   BILITOT 0.3 07/31/2017   GFRNONAA >60 06/08/2018 0317   GFRNONAA 81.51 07/31/2017   GFRNONAA 64 11/09/2013 1135   GFRAA >60 06/08/2018 0317   GFRAA 74 11/09/2013 1135   Recent Labs    06/06/18 0646 06/07/18 0317 06/08/18 0317 06/08/18 1052 06/18/18 06/20/18  NA 145 144 142  --  141  --   K 2.4* 2.9* 2.4* 2.7* 3.7  --   CL 111 114* 106  --   --   --   CO2 21* 19* 21*  --   --   --   GLUCOSE 100* 110* 89  --   --   --   BUN 15 12 11   --  17  --   CREATININE 0.67 0.58 0.70  --  0.4* 0.6  CALCIUM 8.5* 8.7* 9.0  --   --   --   MG  --   --  1.6*  --   --   --    Recent Labs    07/31/17 06/04/18 1121 06/05/18 0320  AST 14 22 22   ALT 5 16 16   ALKPHOS 87 66 59  BILITOT 0.3 0.5 0.5  PROT 5.8 5.9* 5.7*  ALBUMIN 3.5 2.8* 2.5*   Recent Labs    06/04/18 1121  06/06/18 0646 06/07/18 0317 06/08/18 0317  WBC 10.6*   < > 8.6 7.4 7.6  NEUTROABS 8.6*  --   --   --   --   HGB 11.4*   < > 11.3* 11.9* 12.8  HCT 36.8   < > 34.9* 36.1 38.8  MCV 97.6   < > 93.1 90.7 90.2  PLT 163   < > 167 205 200   < > = values in this interval not displayed.   Recent Labs    07/31/17  CHOL 134  LDLCALC 61  TRIG 102   No results found for: [facility] Lab Results  Component Value Date   TSH  0.59 07/31/2017   Lab Results  Component Value Date   HGBA1C 5.4 06/11/2016   Lab Results  Component Value Date   CHOL 134 07/31/2017   HDL 54 06/11/2016   LDLCALC 61 07/31/2017   TRIG 102 07/31/2017    Significant Diagnostic Results in last 30 days:  No results found.  Assessment and Plan  HTN (hypertension) Controlled on no meds; continue to monitor  GERD (gastroesophageal reflux disease) Controlled with MOM 30 cc daily; continue current regimen  Depression Relatively well controlled; continue Remeron 15 mg nightly     Angeli Demilio D. [name], MD

## 2018-07-13 ENCOUNTER — Encounter: Payer: Self-pay | Admitting: Internal Medicine

## 2018-07-13 NOTE — Assessment & Plan Note (Signed)
Controlled with MOM 30 cc daily; continue current regimen

## 2018-07-13 NOTE — Assessment & Plan Note (Signed)
Relatively well controlled; continue Remeron 15 mg nightly

## 2018-07-13 NOTE — Assessment & Plan Note (Signed)
Controlled on no meds; continue to monitor

## 2018-07-21 DIAGNOSIS — R1312 Dysphagia, oropharyngeal phase: Secondary | ICD-10-CM | POA: Diagnosis not present

## 2018-07-22 DIAGNOSIS — R1312 Dysphagia, oropharyngeal phase: Secondary | ICD-10-CM | POA: Diagnosis not present

## 2018-07-23 ENCOUNTER — Ambulatory Visit (HOSPITAL_COMMUNITY)
Admission: RE | Admit: 2018-07-23 | Discharge: 2018-07-23 | Disposition: A | Discharge status: Home / Self Care | Payer: Medicare Other | Source: Ambulatory Visit | Attending: Internal Medicine | Admitting: Internal Medicine

## 2018-07-23 ENCOUNTER — Other Ambulatory Visit: Payer: Self-pay

## 2018-07-23 DIAGNOSIS — R1310 Dysphagia, unspecified: Secondary | ICD-10-CM | POA: Diagnosis not present

## 2018-07-23 DIAGNOSIS — T17320A Food in larynx causing asphyxiation, initial encounter: Secondary | ICD-10-CM | POA: Diagnosis not present

## 2018-07-23 DIAGNOSIS — R1312 Dysphagia, oropharyngeal phase: Secondary | ICD-10-CM | POA: Diagnosis not present

## 2018-07-23 DIAGNOSIS — K219 Gastro-esophageal reflux disease without esophagitis: Secondary | ICD-10-CM | POA: Diagnosis not present

## 2018-07-24 DIAGNOSIS — R1312 Dysphagia, oropharyngeal phase: Secondary | ICD-10-CM | POA: Diagnosis not present

## 2018-07-25 DIAGNOSIS — R1312 Dysphagia, oropharyngeal phase: Secondary | ICD-10-CM | POA: Diagnosis not present

## 2018-07-28 DIAGNOSIS — R1312 Dysphagia, oropharyngeal phase: Secondary | ICD-10-CM | POA: Diagnosis not present

## 2018-07-30 DIAGNOSIS — F428 Other obsessive-compulsive disorder: Secondary | ICD-10-CM | POA: Diagnosis not present

## 2018-07-30 DIAGNOSIS — F39 Unspecified mood [affective] disorder: Secondary | ICD-10-CM | POA: Diagnosis not present

## 2018-07-30 DIAGNOSIS — F0391 Unspecified dementia with behavioral disturbance: Secondary | ICD-10-CM | POA: Diagnosis not present

## 2018-07-30 DIAGNOSIS — F419 Anxiety disorder, unspecified: Secondary | ICD-10-CM | POA: Diagnosis not present

## 2018-08-07 ENCOUNTER — Encounter: Payer: Self-pay | Admitting: Internal Medicine

## 2018-08-07 ENCOUNTER — Non-Acute Institutional Stay (SKILLED_NURSING_FACILITY): Payer: Medicare Other | Admitting: Internal Medicine

## 2018-08-07 DIAGNOSIS — M4125 Other idiopathic scoliosis, thoracolumbar region: Secondary | ICD-10-CM

## 2018-08-07 DIAGNOSIS — G301 Alzheimer's disease with late onset: Secondary | ICD-10-CM

## 2018-08-07 DIAGNOSIS — J449 Chronic obstructive pulmonary disease, unspecified: Secondary | ICD-10-CM

## 2018-08-07 DIAGNOSIS — F0281 Dementia in other diseases classified elsewhere with behavioral disturbance: Secondary | ICD-10-CM

## 2018-08-07 DIAGNOSIS — F02818 Dementia in other diseases classified elsewhere, unspecified severity, with other behavioral disturbance: Secondary | ICD-10-CM

## 2018-08-07 NOTE — Progress Notes (Signed)
Location:  [facility] Room Number: 304-D Place of Service:  SNF (31)  Taylor Roth Duos, MD  Patient Care Team: Taylor Roth Duos, MD as PCP - General (Internal Medicine) [name], MD (Gastroenterology) [name], NP (Obstetrics and Gynecology)  Extended Emergency Contact Information Primary Emergency Contact: Taylor Roth Roth,Taylor Roth Address: [address], [place] Phone: [phone] Mobile Phone: [phone] Relation: Daughter Secondary Emergency Contact: Taylor Roth [place] Phone: [phone] Mobile Phone: [phone] Relation: Daughter    Allergies: Amoxicillin, Ampicillin, Depakote [valproic acid], Macrobid [nitrofurantoin macrocrystal], Penicillins, Acetaminophen, Azithromycin, Butalbital, Caffeine, Doxycycline, Escitalopram oxalate, Fioricet [butalbital-apap-caffeine], Latex, Levofloxacin, Levofloxacin, Nsaids, Oxycodone, Restasis [cyclosporine], Sulfa antibiotics, and Biaxin [clarithromycin]  Chief Complaint  Patient presents with   Medical Management of Chronic Issues    Routine [facility] visit    HPI: Patient is an 85 y.o. female who is being seen for routine issues of COPD, dementia, idiopathic scoliosis.  Past Medical History:  Diagnosis Date   Anxiety    Arterial tortuosity (aorta) 12/11/2012   Asthma    Chronic kidney disease, stage IV (severe) ([place]) 12/11/2012   Chronic mental illness    CKD (chronic kidney disease), stage III ([place]) 01/14/2015   Closed hip fracture requiring operative repair, right, with routine healing, subsequent encounter 12/19/2015   Colitis    COPD (chronic obstructive pulmonary disease) (HCC)    Decreased rectal sphincter tone 11/21/2012   Depression    Drug-seeking behavior 02/26/2011   Use of multiple md to obtain benzos/narcotics pt must sign contract to receive controlled meds.    Dysphagia  01/11/2015   GERD (gastroesophageal reflux disease)    Glaucoma 06/26/2011   Glaucoma of both eyes    Hearing loss, sensorineural 03/04/2014   [facility] ENT; 02/26/14. There is not a medical or surgical treatment that would benefit her type of hearing loss. We discussed amplification in an overview fashion.     History of fall    Insomnia, unspecified    Macular degeneration 06/26/2011   Migraines    Nasal bone fracture 03/30/2014   Osteoporosis    Osteoporosis 02/26/2011   Presence of right artificial hip joint    Protein-calorie malnutrition, severe ([place]) 12/10/2012   S/P ORIF (open reduction internal fixation) fracture 01/08/2016   Schatzki's ring 01/14/2015   Scoliosis    Substance abuse ([place])    Benbzos and Narcotics, she does not get any of those medicines from [facility], we have empathetically told her that.   Vertigo    Vitamin B deficiency    Vitamin D deficiency 08/25/2016   Weight loss     Past Surgical History:  Procedure Laterality Date   ABDOMINAL HYSTERECTOMY     CATARACT EXTRACTION     CHOLECYSTECTOMY     ESOPHAGOGASTRODUODENOSCOPY (EGD) WITH PROPOFOL N/A 01/13/2015   Procedure: ESOPHAGOGASTRODUODENOSCOPY (EGD) WITH PROPOFOL;  Surgeon: [name], MD;  [facility];  Service: Endoscopy;  Laterality: N/A;   HIP ARTHROPLASTY Right 12/21/2015   Procedure: ARTHROPLASTY BIPOLAR HIP (HEMIARTHROPLASTY);  Surgeon: [name], MD;  Location: [facility];  Service: Orthopedics;  Laterality: Right;   PARTIAL HYSTERECTOMY      Allergies as of 08/07/2018      Reactions   Amoxicillin    Per daughter   Ampicillin    Per daughter   Depakote [valproic Acid]    DOES NOT WANT HER TO  TAKE IT==Makes her hair fall out= per daughter who is poa   Macrobid [nitrofurantoin Macrocrystal] Rash   Per MAR   Penicillins    Per daughter   Acetaminophen Other (See Comments)   jaundice   Azithromycin Other (See Comments)   Per MAR   Butalbital     Caffeine    Doxycycline Other (See Comments)   Per MAR   Escitalopram Oxalate Other (See Comments)   Per MAR   Fioricet [butalbital-apap-caffeine] Other (See Comments)   Drunk. Per MAR   Latex Other (See Comments)   Per MAR   Levofloxacin Other (See Comments)   Per MAR   Levofloxacin    Nsaids Other (See Comments)   This is not an allergy. The patient was told by Dr. Rockne Menghini to avoid NSAIDs and stop Vicoprofen in to 2014 to avoid nephrotoxicity. Per MAR.   Oxycodone Other (See Comments)   reaction to synthetic codeine Per MAR   Restasis [cyclosporine] Other (See Comments)   Per MAR   Sulfa Antibiotics Other (See Comments)   Per MAR   Biaxin [clarithromycin] Rash   With burning sensation Per [facility]      Medication List       Accurate as of August 07, 2018 11:59 PM. If you have any questions, ask your nurse or doctor.        acetaminophen 325 MG tablet Commonly known as: TYLENOL Take 650 mg by mouth every 6 (six) hours as needed for headache.   acetaminophen 500 MG tablet Commonly known as: TYLENOL Give 2 tablets ( 1,000 mg total ) by mouth nightly for pain   albuterol 108 (90 Base) MCG/ACT inhaler Commonly known as: VENTOLIN HFA Inhale 1 puff into the lungs every 6 (six) hours as needed for shortness of breath.   Biotin 5000 MCG Caps Take 1 capsule by mouth daily.   Claritin 10 MG tablet Generic drug: loratadine Take 10 mg by mouth daily.   Cranberry 450 MG Tabs Take 1 tablet by mouth 2 (two) times daily.   docusate sodium 100 MG capsule Commonly known as: COLACE Take 100 mg by mouth 2 (two) times daily.   dorzolamide-timolol 22.3-6.8 MG/ML ophthalmic solution Commonly known as: COSOPT Place 1 drop into both eyes 2 (two) times daily.   Ensure Take 237 mLs by mouth 4 (four) times daily. D/t weight loss   ferrous sulfate 325 (65 FE) MG tablet Take 325 mg by mouth daily.   fludrocortisone 0.1 MG tablet Commonly known as: FLORINEF Take 0.1 mg by mouth every  morning. for orthostatic hypotension   FLUoxetine 10 MG tablet Commonly known as: PROZAC Take 10 mg by mouth daily.   lactulose 10 GM/15ML solution Commonly known as: CHRONULAC Take 15 mLs (10 g total) by mouth daily as needed for severe constipation.   mirtazapine 15 MG tablet Commonly known as: REMERON Take 7.5 mg by mouth at bedtime.   neomycin-bacitracin-polymyxin [phone] ointment TRIPLE ANTIBIOTIC OINTMENT APPLY AROUND MOUTH AND NOSE ONCE DAILY   nitroGLYCERIN 0.4 MG SL tablet Commonly known as: NITROSTAT Place 0.4 mg under the tongue every 5 (five) minutes as needed for chest pain. May use 3 times   polyethylene glycol 17 g packet Commonly known as: MIRALAX / GLYCOLAX Take 17 g by mouth daily.   potassium chloride SA 20 MEQ tablet Commonly known as: K-DUR Take 1 tablet (20 mEq total) by mouth daily.   QUEtiapine 100 MG tablet Commonly known as: SEROQUEL Take 100 mg by mouth at bedtime.  senna 8.6 MG tablet Commonly known as: SENOKOT Take 2 tablets by mouth 2 (two) times daily as needed for constipation.   Systane 0.4-0.3 % Soln Generic drug: Polyethyl Glycol-Propyl Glycol Place 2 drops into both eyes every 2 (two) hours as needed (dry eyes).   travoprost (benzalkonium) 0.004 % ophthalmic solution Commonly known as: TRAVATAN Place 1 drop into both eyes at bedtime.   vitamin B-12 1000 MCG tablet Commonly known as: CYANOCOBALAMIN Take 1,500 mcg by mouth daily. gummies   Vitamin D 1000 units capsule Take 1,000 Units by mouth daily with breakfast.       No orders of the defined types were placed in this encounter.   Immunization History  Administered Date(s) Administered   Influenza Split 11/13/2011   Influenza-Unspecified 02/13/2008, 11/30/2013, 12/20/2016   PPD Test 12/15/2015   Pneumococcal Conjugate-13 10/16/2016   Pneumococcal-Unspecified 02/13/2008   Td 02/12/2009    Social History   Tobacco Use   Smoking status: Former Smoker      Packs/day: 0.50    Types: Cigarettes   Smokeless tobacco: Never Used  Substance Use Topics   Alcohol use: No    Review of Systems  DATA OBTAINED: from nurse-no acute concerns GENERAL:  no fevers, fatigue, appetite changes SKIN: No itching, rash HEENT: No complaint RESPIRATORY: No cough, wheezing, SOB CARDIAC: No chest pain, palpitations, lower extremity edema  GI: No abdominal pain, No N/V/D or constipation, No heartburn or reflux  GU: No dysuria, frequency or urgency, or incontinence  MUSCULOSKELETAL: No unrelieved bone/joint pain NEUROLOGIC: No headache, dizziness  PSYCHIATRIC: No overt anxiety or sadness  Vitals:   08/07/18 1053  BP: 110/70  Pulse: 68  Resp: 18  Temp: (!) 97.2 F (36.2 C)  SpO2: 96%   Body mass index is 15.79 kg/m. Physical Exam  GENERAL APPEARANCE: Alert, conversant, No acute distress  SKIN: No diaphoresis rash HEENT: Unremarkable RESPIRATORY: Breathing is even, unlabored. Lung sounds are clear   CARDIOVASCULAR: Heart RRR no murmurs, rubs or gallops. No peripheral edema  GASTROINTESTINAL: Abdomen is soft, non-tender, not distended w/ normal bowel sounds.  GENITOURINARY: Bladder non tender, not distended  MUSCULOSKELETAL: No abnormal joints or musculature NEUROLOGIC: Cranial nerves 2-12 grossly intact. Moves all extremities PSYCHIATRIC: Mood and affect appropriate to situation with dementia, no behavioral issues  Patient Active Problem List   Diagnosis Date Noted   Allergic rhinitis 06/09/2018   Pressure injury of skin 06/05/2018   MRSA bacteremia 06/04/2018   Chronic generalized pain 05/16/2018   Dry eye syndrome of both eyes 02/28/2018   Abnormal EKG 01/24/2017   Delusions ([place])    Paranoia ([place])    Vitamin D deficiency 08/25/2016   Dementia with behavioral problem ([place]) 03/25/2016   S/P ORIF (open reduction internal fixation) fracture 01/08/2016   Oral thrush 01/08/2016   HTN (hypertension) 01/08/2016   Closed hip  fracture requiring operative repair, right, with routine healing, subsequent encounter 12/19/2015   Bipolar disease, chronic ([place]) 12/17/2015   Decubitus ulcer of hip, stage 3 ([place]) 12/11/2015   Legally blind 08/16/2015   Ankle pain    Encephalopathy    Altered mental status 01/26/2015   Nausea and vomiting 01/17/2015   Esophageal dysmotilities    Urinary tract infectious disease    Weakness 01/14/2015   Schatzki's ring 01/14/2015   CKD (chronic kidney disease), stage III ([place]) 01/14/2015   Esophagus disorder    Dysphagia 01/11/2015   Lower urinary tract infectious disease 03/30/2014   Hearing loss, sensorineural 03/04/2014   Nasal  lesion 03/04/2014   Constipation 12/12/2012   Hypokalemia 12/11/2012   Orthostatic hypotension 12/11/2012   Arterial tortuosity (aorta) 12/11/2012   Protein-calorie malnutrition, severe ([place]) 12/10/2012   Adult failure to thrive 12/09/2012   Decreased rectal sphincter tone 11/21/2012   Primary open angle glaucoma of both eyes, severe stage 11/11/2012   Idiopathic scoliosis 07/08/2012   Macular atrophy, retinal 06/26/2011   Vision disturbance 06/26/2011   Glaucoma 06/26/2011   Nonexudative senile macular degeneration of retina 06/21/2011   Macula scar of posterior pole 06/21/2011   Status post intraocular lens implant 06/21/2011   Anemia of chronic disease 04/17/2011   Drug-seeking behavior 02/26/2011   Osteoporosis 02/26/2011   Depression    Anxiety    COPD (chronic obstructive pulmonary disease) (HCC)    GERD (gastroesophageal reflux disease)    Migraines    Insomnia, unspecified     CMP     Component Value Date/Time   NA 141 06/18/2018   NA 140 07/31/2017   K 3.7 06/18/2018   K 5.0 07/31/2017   CL 106 06/08/2018 0317   CO2 21 (L) 06/08/2018 0317   GLUCOSE 89 06/08/2018 0317   BUN 17 06/18/2018   CREATININE 0.6 06/20/2018   CREATININE 0.70 06/08/2018 0317   CREATININE 0.62 07/31/2017    CALCIUM 9.0 06/08/2018 0317   CALCIUM 8.9 07/31/2017   PROT 5.7 (L) 06/05/2018 0320   PROT 5.8 07/31/2017   ALBUMIN 2.5 (L) 06/05/2018 0320   ALBUMIN 3.5 07/31/2017   AST 22 06/05/2018 0320   AST 14 07/31/2017   ALT 16 06/05/2018 0320   ALT 5 07/31/2017   ALKPHOS 59 06/05/2018 0320   ALKPHOS 87 07/31/2017   BILITOT 0.5 06/05/2018 0320   BILITOT 0.3 07/31/2017   GFRNONAA >60 06/08/2018 0317   GFRNONAA 81.51 07/31/2017   GFRNONAA 64 11/09/2013 1135   GFRAA >60 06/08/2018 0317   GFRAA 74 11/09/2013 1135   Recent Labs    06/06/18 0646 06/07/18 0317 06/08/18 0317 06/08/18 1052 06/18/18 06/20/18  NA 145 144 142  --  141  --   K 2.4* 2.9* 2.4* 2.7* 3.7  --   CL 111 114* 106  --   --   --   CO2 21* 19* 21*  --   --   --   GLUCOSE 100* 110* 89  --   --   --   BUN 15 12 11   --  17  --   CREATININE 0.67 0.58 0.70  --  0.4* 0.6  CALCIUM 8.5* 8.7* 9.0  --   --   --   MG  --   --  1.6*  --   --   --    Recent Labs    06/04/18 1121 06/05/18 0320  AST 22 22  ALT 16 16  ALKPHOS 66 59  BILITOT 0.5 0.5  PROT 5.9* 5.7*  ALBUMIN 2.8* 2.5*   Recent Labs    06/04/18 1121  06/06/18 0646 06/07/18 0317 06/08/18 0317  WBC 10.6*   < > 8.6 7.4 7.6  NEUTROABS 8.6*  --   --   --   --   HGB 11.4*   < > 11.3* 11.9* 12.8  HCT 36.8   < > 34.9* 36.1 38.8  MCV 97.6   < > 93.1 90.7 90.2  PLT 163   < > 167 205 200   < > = values in this interval not displayed.   No results for input(s): CHOL, LDLCALC, TRIG in  the last 8760 hours.  Invalid input(s): HCL No results found for: [facility] Lab Results  Component Value Date   TSH 0.59 07/31/2017   Lab Results  Component Value Date   HGBA1C 5.4 06/11/2016   Lab Results  Component Value Date   CHOL 134 07/31/2017   HDL 54 06/11/2016   LDLCALC 61 07/31/2017   TRIG 102 07/31/2017    Significant Diagnostic Results in last 30 days:  Dg Swallow Func Op [employer] Speech Path  Result Date: 07/23/2018 CLINICAL DATA:  Chronic aspiration.  EXAM: MODIFIED BARIUM SWALLOW TECHNIQUE: Different consistencies of barium were administered orally to the patient by the Speech Pathologist. Imaging of the pharynx was performed in the lateral projection. The radiologist was present in the fluoroscopy room for this study, providing personal supervision. FLUOROSCOPY TIME:  Fluoroscopy Time:  1 minutes 20 seconds Radiation Exposure Index (if provided by the fluoroscopic device): NA Number of Acquired Spot Images: 0 COMPARISON:  03/26/2017 FINDINGS: Aspiration was noted with thin barium and nectar thick barium. IMPRESSION: Aspiration noted. Please refer to the Speech Pathologists report for complete details and recommendations. Electronically Signed   By: Kerby Moors M.D.   On: 07/23/2018 12:25 Objective Swallowing Evaluation: Type of Study: MBS-Modified Barium Swallow Study  Patient Details Name: Taylor Roth Roth MRN: [id number] Date of Birth: 06/06/1928 Today's Date: 07/23/2018 Time: SLP Start Time (ACUTE ONLY): 1200 -SLP Stop Time (ACUTE ONLY): 1225 SLP Time Calculation (min) (ACUTE ONLY): 25 min Past Medical History: Past Medical History: Diagnosis Date  Anxiety   Arterial tortuosity (aorta) 12/11/2012  Asthma   Chronic kidney disease, stage IV (severe) ([place]) 12/11/2012  Chronic mental illness   CKD (chronic kidney disease), stage III ([place]) 01/14/2015  Closed hip fracture requiring operative repair, right, with routine healing, subsequent encounter 12/19/2015  Colitis   COPD (chronic obstructive pulmonary disease) ([place] )   Decreased rectal sphincter tone 11/21/2012  Depression   Drug-seeking behavior 02/26/2011  Use of multiple md to obtain benzos/narcotics pt must sign contract to receive controlled meds.   Dysphagia 01/11/2015  GERD (gastroesophageal reflux disease)   Glaucoma 06/26/2011  Glaucoma of both eyes   Hearing loss, sensorineural 03/04/2014  [facility]; 02/26/14. There is not a medical or surgical treatment that would benefit her type  of hearing loss. We discussed amplification in an overview fashion.    History of fall   Insomnia, unspecified   Macular degeneration 06/26/2011  Migraines   Nasal bone fracture 03/30/2014  Osteoporosis   Osteoporosis 02/26/2011  Presence of right artificial hip joint   Protein-calorie malnutrition, severe ([place]) 12/10/2012  S/P ORIF (open reduction internal fixation) fracture 01/08/2016  Schatzki's ring 01/14/2015  Scoliosis   Substance abuse ([place])   Benbzos and Narcotics, she does not get any of those medicines from [facility], we have empathetically told her that.  Vertigo   Vitamin B deficiency   Vitamin D deficiency 08/25/2016  Weight loss  Past Surgical History: Past Surgical History: Procedure Laterality Date  ABDOMINAL HYSTERECTOMY    CATARACT EXTRACTION    CHOLECYSTECTOMY    ESOPHAGOGASTRODUODENOSCOPY (EGD) WITH PROPOFOL N/A 01/13/2015  Procedure: ESOPHAGOGASTRODUODENOSCOPY (EGD) WITH PROPOFOL;  Surgeon: [name], MD;  [facility];  Service: Endoscopy;  Laterality: N/A;  HIP ARTHROPLASTY Right 12/21/2015  Procedure: ARTHROPLASTY BIPOLAR HIP (HEMIARTHROPLASTY);  Surgeon: [name], MD;  Location: [place];  Service: Orthopedics;  Laterality: Right;  PARTIAL HYSTERECTOMY   HPI: Patient is an 85 y.o. female long term SNF resident with PMH: anxiety, asthma,  COPD, mental illness, depression, dysphagia, GERD, who presented to outpatient MBS secondary to reports from facility of over s/s of aspiration and penetration, poor appetite, globus sensation, which has been ongoing for past 2-3 weeks. Patient had an MBS in 03/26/2017 which revealed severe oropharyngeal dysphagia with silent aspiration of thin and nectar thick liquids before and during the swallow. At that time, Dys 2 (fine chop) and nectar thick liquids was recommended.  Subjective: HOH, cooperative Assessment / Plan / Recommendation CHL IP CLINICAL IMPRESSIONS 07/23/2018 Clinical Impression Patient presents with a mild-mod  oral and severe pharyngeal dysphagia which appears to have worsened as compared to MBS in 03/26/17. Patient exhibited moderate amount of aspiration of thin liquids and nectar thick liquids during the swallow, which was not sensed until aspirates had traveled down trachea. Chin tuck and cued cough were both ineffective at preventing aspiration or penetration. Puree solids and honey thick liquids both resulted in swallow initiated at level of vallecular sinus and mild amount of residuals, mainly at level of vallecular sinus but no aspiration or penetration. Dysphagia is sensori-motor based with patient exhibiting decreased sensation, decreased hyolaryngeal excursion and poor airway protection, leading to silent aspiration with thin liquids and nectar thick liquids.  SLP Visit Diagnosis Dysphagia, oropharyngeal phase (R13.12) Attention and concentration deficit following -- Frontal lobe and executive function deficit following -- Impact on safety and function Severe aspiration risk   CHL IP TREATMENT RECOMMENDATION 07/23/2018 Treatment Recommendations Other (Comment)   Prognosis 07/23/2018 Prognosis for Safe Diet Advancement Guarded Barriers to Reach Goals Time post onset;Severity of deficits Barriers/Prognosis Comment -- CHL IP DIET RECOMMENDATION 07/23/2018 SLP Diet Recommendations Dysphagia 1 (Puree) solids;Honey thick liquids Liquid Administration via Cup;Spoon Medication Administration Crushed with puree Compensations Slow rate;Small sips/bites Postural Changes Seated upright at 90 degrees   CHL IP OTHER RECOMMENDATIONS 07/23/2018 Recommended Consults Other (Comment) Oral Care Recommendations Oral care BID Other Recommendations --   CHL IP FOLLOW UP RECOMMENDATIONS 07/23/2018 Follow up Recommendations Skilled Nursing facility   [facility] IP FREQUENCY AND DURATION 12/20/2015 Speech Therapy Frequency (ACUTE ONLY) min 1 x/week Treatment Duration 1 week      CHL IP ORAL PHASE 07/23/2018 Oral Phase Impaired Oral - Pudding Teaspoon  -- Oral - Pudding Cup -- Oral - Honey Teaspoon Delayed oral transit;Lingual pumping;Reduced posterior propulsion;Weak lingual manipulation Oral - Honey Cup Weak lingual manipulation;Reduced posterior propulsion;Delayed oral transit Oral - Nectar Teaspoon -- Oral - Nectar Cup Weak lingual manipulation;Delayed oral transit Oral - Nectar Straw -- Oral - Thin Teaspoon NT Oral - Thin Cup Weak lingual manipulation;Delayed oral transit;Reduced posterior propulsion Oral - Thin Straw -- Oral - Puree Weak lingual manipulation;Reduced posterior propulsion;Delayed oral transit Oral - Mech Soft -- Oral - Regular -- Oral - Multi-Consistency -- Oral - Pill -- Oral Phase - Comment --  CHL IP PHARYNGEAL PHASE 07/23/2018 Pharyngeal Phase Impaired Pharyngeal- Pudding Teaspoon -- Pharyngeal -- Pharyngeal- Pudding Cup -- Pharyngeal -- Pharyngeal- Honey Teaspoon Delayed swallow initiation-vallecula;Pharyngeal residue - valleculae Pharyngeal -- Pharyngeal- Honey Cup Delayed swallow initiation-vallecula;Pharyngeal residue - valleculae;Pharyngeal residue - pyriform Pharyngeal -- Pharyngeal- Nectar Teaspoon Delayed swallow initiation-vallecula;Reduced airway/laryngeal closure;Reduced laryngeal elevation;Reduced epiglottic inversion;Penetration/Aspiration during swallow Pharyngeal Material enters airway, passes BELOW cords without attempt by patient to eject out (silent aspiration) Pharyngeal- Nectar Cup Delayed swallow initiation-vallecula;Delayed swallow initiation-pyriform sinuses;Pharyngeal residue - valleculae;Reduced airway/laryngeal closure;Reduced tongue base retraction;Penetration/Aspiration during swallow;Reduced epiglottic inversion;Reduced anterior laryngeal mobility Pharyngeal Material enters airway, passes BELOW cords without attempt by patient to eject out (silent aspiration) Pharyngeal- Nectar Straw -- Pharyngeal --  Pharyngeal- Thin Teaspoon -- Pharyngeal -- Pharyngeal- Thin Cup Delayed swallow initiation-pyriform  sinuses;Delayed swallow initiation-vallecula;Penetration/Aspiration during swallow;Moderate aspiration Pharyngeal Material enters airway, passes BELOW cords without attempt by patient to eject out (silent aspiration) Pharyngeal- Thin Straw -- Pharyngeal -- Pharyngeal- Puree Delayed swallow initiation-vallecula;Pharyngeal residue - valleculae Pharyngeal -- Pharyngeal- Mechanical Soft -- Pharyngeal -- Pharyngeal- Regular -- Pharyngeal -- Pharyngeal- Multi-consistency -- Pharyngeal -- Pharyngeal- Pill -- Pharyngeal -- Pharyngeal Comment --  CHL IP CERVICAL ESOPHAGEAL PHASE 07/23/2018 Cervical Esophageal Phase WFL Pudding Teaspoon -- Pudding Cup -- Honey Teaspoon -- Honey Cup -- Nectar Teaspoon -- Nectar Cup -- Nectar Straw -- Thin Teaspoon -- Thin Cup -- Thin Straw -- Puree -- Mechanical Soft -- Regular -- Multi-consistency -- Pill -- Cervical Esophageal Comment -- Taylor Roth Roth 07/23/2018, 4:48 PM  Sonia Baller, MA, CCC-SLP Speech Therapy [facility] Acute Rehab Pager: [phone]              Assessment and Plan  COPD (chronic obstructive pulmonary disease) ([place]) Continues without exacerbation; continue PRN albuterol  Dementia with behavioral problem ([place]) Chronic and progressive; continue supportive care  Idiopathic scoliosis Does not appear to bother patient; she gets up in a chair but she rarely walks except to the bathroom; continue supportive care    Taylor Roth Duos, MD

## 2018-08-08 DIAGNOSIS — Z20828 Contact with and (suspected) exposure to other viral communicable diseases: Secondary | ICD-10-CM | POA: Diagnosis not present

## 2018-08-10 ENCOUNTER — Encounter: Payer: Self-pay | Admitting: Internal Medicine

## 2018-08-10 NOTE — Assessment & Plan Note (Signed)
Chronic and progressive; continue supportive care

## 2018-08-10 NOTE — Assessment & Plan Note (Signed)
Continues without exacerbation; continue PRN albuterol

## 2018-08-10 NOTE — Assessment & Plan Note (Signed)
Does not appear to bother patient; she gets up in a chair but she rarely walks except to the bathroom; continue supportive care

## 2018-08-19 DIAGNOSIS — F329 Major depressive disorder, single episode, unspecified: Secondary | ICD-10-CM | POA: Diagnosis not present

## 2018-08-19 DIAGNOSIS — J449 Chronic obstructive pulmonary disease, unspecified: Secondary | ICD-10-CM | POA: Diagnosis not present

## 2018-08-19 DIAGNOSIS — R1312 Dysphagia, oropharyngeal phase: Secondary | ICD-10-CM | POA: Diagnosis not present

## 2018-08-19 DIAGNOSIS — M6281 Muscle weakness (generalized): Secondary | ICD-10-CM | POA: Diagnosis not present

## 2018-08-19 DIAGNOSIS — R278 Other lack of coordination: Secondary | ICD-10-CM | POA: Diagnosis not present

## 2018-08-20 DIAGNOSIS — R278 Other lack of coordination: Secondary | ICD-10-CM | POA: Diagnosis not present

## 2018-08-20 DIAGNOSIS — F329 Major depressive disorder, single episode, unspecified: Secondary | ICD-10-CM | POA: Diagnosis not present

## 2018-08-20 DIAGNOSIS — Z20828 Contact with and (suspected) exposure to other viral communicable diseases: Secondary | ICD-10-CM | POA: Diagnosis not present

## 2018-08-20 DIAGNOSIS — M6281 Muscle weakness (generalized): Secondary | ICD-10-CM | POA: Diagnosis not present

## 2018-08-20 DIAGNOSIS — J449 Chronic obstructive pulmonary disease, unspecified: Secondary | ICD-10-CM | POA: Diagnosis not present

## 2018-08-20 DIAGNOSIS — R1312 Dysphagia, oropharyngeal phase: Secondary | ICD-10-CM | POA: Diagnosis not present

## 2018-08-21 DIAGNOSIS — J449 Chronic obstructive pulmonary disease, unspecified: Secondary | ICD-10-CM | POA: Diagnosis not present

## 2018-08-21 DIAGNOSIS — R1312 Dysphagia, oropharyngeal phase: Secondary | ICD-10-CM | POA: Diagnosis not present

## 2018-08-21 DIAGNOSIS — R278 Other lack of coordination: Secondary | ICD-10-CM | POA: Diagnosis not present

## 2018-08-21 DIAGNOSIS — F329 Major depressive disorder, single episode, unspecified: Secondary | ICD-10-CM | POA: Diagnosis not present

## 2018-08-21 DIAGNOSIS — M6281 Muscle weakness (generalized): Secondary | ICD-10-CM | POA: Diagnosis not present

## 2018-08-22 DIAGNOSIS — R1312 Dysphagia, oropharyngeal phase: Secondary | ICD-10-CM | POA: Diagnosis not present

## 2018-08-22 DIAGNOSIS — M6281 Muscle weakness (generalized): Secondary | ICD-10-CM | POA: Diagnosis not present

## 2018-08-22 DIAGNOSIS — R278 Other lack of coordination: Secondary | ICD-10-CM | POA: Diagnosis not present

## 2018-08-22 DIAGNOSIS — F329 Major depressive disorder, single episode, unspecified: Secondary | ICD-10-CM | POA: Diagnosis not present

## 2018-08-22 DIAGNOSIS — J449 Chronic obstructive pulmonary disease, unspecified: Secondary | ICD-10-CM | POA: Diagnosis not present

## 2018-08-25 ENCOUNTER — Telehealth: Payer: Self-pay | Admitting: *Deleted

## 2018-08-25 LAB — NOVEL CORONAVIRUS, NAA: SARS-CoV-2, NAA: NEGATIVE

## 2018-08-25 NOTE — Telephone Encounter (Signed)
[facility] Dept calling with NEGATIVE covid results.

## 2018-08-26 DIAGNOSIS — F329 Major depressive disorder, single episode, unspecified: Secondary | ICD-10-CM | POA: Diagnosis not present

## 2018-08-26 DIAGNOSIS — R1312 Dysphagia, oropharyngeal phase: Secondary | ICD-10-CM | POA: Diagnosis not present

## 2018-08-26 DIAGNOSIS — R278 Other lack of coordination: Secondary | ICD-10-CM | POA: Diagnosis not present

## 2018-08-26 DIAGNOSIS — J449 Chronic obstructive pulmonary disease, unspecified: Secondary | ICD-10-CM | POA: Diagnosis not present

## 2018-08-26 DIAGNOSIS — M6281 Muscle weakness (generalized): Secondary | ICD-10-CM | POA: Diagnosis not present

## 2018-08-27 DIAGNOSIS — F428 Other obsessive-compulsive disorder: Secondary | ICD-10-CM | POA: Diagnosis not present

## 2018-08-27 DIAGNOSIS — R441 Visual hallucinations: Secondary | ICD-10-CM | POA: Diagnosis not present

## 2018-08-27 DIAGNOSIS — R1312 Dysphagia, oropharyngeal phase: Secondary | ICD-10-CM | POA: Diagnosis not present

## 2018-08-27 DIAGNOSIS — F0391 Unspecified dementia with behavioral disturbance: Secondary | ICD-10-CM | POA: Diagnosis not present

## 2018-08-27 DIAGNOSIS — F329 Major depressive disorder, single episode, unspecified: Secondary | ICD-10-CM | POA: Diagnosis not present

## 2018-08-27 DIAGNOSIS — J449 Chronic obstructive pulmonary disease, unspecified: Secondary | ICD-10-CM | POA: Diagnosis not present

## 2018-08-27 DIAGNOSIS — R278 Other lack of coordination: Secondary | ICD-10-CM | POA: Diagnosis not present

## 2018-08-27 DIAGNOSIS — F39 Unspecified mood [affective] disorder: Secondary | ICD-10-CM | POA: Diagnosis not present

## 2018-08-27 DIAGNOSIS — M6281 Muscle weakness (generalized): Secondary | ICD-10-CM | POA: Diagnosis not present

## 2018-08-28 DIAGNOSIS — R278 Other lack of coordination: Secondary | ICD-10-CM | POA: Diagnosis not present

## 2018-08-28 DIAGNOSIS — J449 Chronic obstructive pulmonary disease, unspecified: Secondary | ICD-10-CM | POA: Diagnosis not present

## 2018-08-28 DIAGNOSIS — R1312 Dysphagia, oropharyngeal phase: Secondary | ICD-10-CM | POA: Diagnosis not present

## 2018-08-28 DIAGNOSIS — M6281 Muscle weakness (generalized): Secondary | ICD-10-CM | POA: Diagnosis not present

## 2018-08-28 DIAGNOSIS — F329 Major depressive disorder, single episode, unspecified: Secondary | ICD-10-CM | POA: Diagnosis not present

## 2018-08-29 DIAGNOSIS — R1312 Dysphagia, oropharyngeal phase: Secondary | ICD-10-CM | POA: Diagnosis not present

## 2018-08-29 DIAGNOSIS — R278 Other lack of coordination: Secondary | ICD-10-CM | POA: Diagnosis not present

## 2018-08-29 DIAGNOSIS — F329 Major depressive disorder, single episode, unspecified: Secondary | ICD-10-CM | POA: Diagnosis not present

## 2018-08-29 DIAGNOSIS — M6281 Muscle weakness (generalized): Secondary | ICD-10-CM | POA: Diagnosis not present

## 2018-08-29 DIAGNOSIS — J449 Chronic obstructive pulmonary disease, unspecified: Secondary | ICD-10-CM | POA: Diagnosis not present

## 2018-09-01 ENCOUNTER — Encounter: Payer: Self-pay | Admitting: Internal Medicine

## 2018-09-01 ENCOUNTER — Non-Acute Institutional Stay (SKILLED_NURSING_FACILITY): Payer: Medicare Other | Admitting: Internal Medicine

## 2018-09-01 DIAGNOSIS — R278 Other lack of coordination: Secondary | ICD-10-CM | POA: Diagnosis not present

## 2018-09-01 DIAGNOSIS — N183 Chronic kidney disease, stage 3 unspecified: Secondary | ICD-10-CM

## 2018-09-01 DIAGNOSIS — F0281 Dementia in other diseases classified elsewhere with behavioral disturbance: Secondary | ICD-10-CM | POA: Diagnosis not present

## 2018-09-01 DIAGNOSIS — E876 Hypokalemia: Secondary | ICD-10-CM

## 2018-09-01 DIAGNOSIS — I951 Orthostatic hypotension: Secondary | ICD-10-CM

## 2018-09-01 DIAGNOSIS — E43 Unspecified severe protein-calorie malnutrition: Secondary | ICD-10-CM | POA: Diagnosis not present

## 2018-09-01 DIAGNOSIS — J449 Chronic obstructive pulmonary disease, unspecified: Secondary | ICD-10-CM

## 2018-09-01 DIAGNOSIS — M6281 Muscle weakness (generalized): Secondary | ICD-10-CM | POA: Diagnosis not present

## 2018-09-01 DIAGNOSIS — F319 Bipolar disorder, unspecified: Secondary | ICD-10-CM | POA: Diagnosis not present

## 2018-09-01 DIAGNOSIS — J069 Acute upper respiratory infection, unspecified: Secondary | ICD-10-CM | POA: Diagnosis not present

## 2018-09-01 DIAGNOSIS — R1312 Dysphagia, oropharyngeal phase: Secondary | ICD-10-CM | POA: Diagnosis not present

## 2018-09-01 DIAGNOSIS — F329 Major depressive disorder, single episode, unspecified: Secondary | ICD-10-CM | POA: Diagnosis not present

## 2018-09-01 DIAGNOSIS — G301 Alzheimer's disease with late onset: Secondary | ICD-10-CM | POA: Diagnosis not present

## 2018-09-01 DIAGNOSIS — F02818 Dementia in other diseases classified elsewhere, unspecified severity, with other behavioral disturbance: Secondary | ICD-10-CM

## 2018-09-01 DIAGNOSIS — D638 Anemia in other chronic diseases classified elsewhere: Secondary | ICD-10-CM

## 2018-09-01 NOTE — Progress Notes (Signed)
Location:  [facility] Room Number: 304-D Place of Service:  SNF [phone]) Provider:  Granville Lewis, PA-C  [name], MD  Patient Care Team: [name], MD as PCP - General (Internal Medicine) [name], MD (Gastroenterology) [name], NP (Obstetrics and Gynecology)  Extended Emergency Contact Information Primary Emergency Contact: [name],[name] Address: [place], [place] Phone: [phone] Mobile Phone: [phone] Relation: Daughter Secondary Emergency Contact: [place] Phone: [phone] Mobile Phone: [phone] Relation: Daughter  Code Status:  DNR Goals of care: Advanced Directive information Advanced Directives 09/01/2018  Does Patient Have a Medical Advance Directive? Yes  Type of Advance Directive Out of facility DNR (pink MOST or yellow form)  Does patient want to make changes to medical advance directive? No - Guardian declined  Copy of [place] in Chart? -  Would patient like information on creating a medical advance directive? -  Pre-existing out of facility DNR order (yellow form or pink MOST form) -     Chief Complaint  Patient presents with  . Medical Management of Chronic Issues    Routine [facility] visit  Medical management of chronic medical conditions including dementia-COPD- hypertension-hypotension- chronic kidney disease- allergic rhinitis-iron deficiency anemia- protein calorie malnutrition  HPI:  Pt is an 85 y.o. female seen today for medical management of chronic diseases as noted above. Nursing does not report any recent acute issue  She appears to do relatively well with supportive care with a history of dementia- she does continue on Seroquel 100 mg daily  She is also been started on Prozac for a history of scratching behaviors  At times is noted her blood  pressure is somewhat low in the 90s but she is fairly asymptomatic-he is on Florinef with a history of orthostatic hypotension.  In regards to COPD this appears stable on as needed albuterol.   also continues on an antibiotic cream for eczema of her face.   s-she continues to have somewhat of a spotty appetite it appears she is lost about 5 to 7 pounds since April.   is on aggressive supplementation including Ensure 4 times a da currently she is sitting in her chair comfortably she is actually eating her lunch-i--she  appears she is eaten about a third of her plate so far y is also on Remeron to help with appetite stimulation  Does not have any complaints at bedside today she is a poor historian secondary to dementia   Past Medical History:  Diagnosis Date  . Anxiety   . Arterial tortuosity (aorta) 12/11/2012  . Asthma   . Chronic kidney disease, stage IV (severe) ([place]) 12/11/2012  . Chronic mental illness   . CKD (chronic kidney disease), stage III ([place]) 01/14/2015  . Closed hip fracture requiring operative repair, right, with routine healing, subsequent encounter 12/19/2015  . Colitis   . COPD (chronic obstructive pulmonary disease) ([place])   . Decreased rectal sphincter tone 11/21/2012  . Depression   . Drug-seeking behavior 02/26/2011   Use of multiple md to obtain benzos/narcotics pt must sign contract to receive controlled meds.   Marland Kitchen Dysphagia 01/11/2015  . GERD (gastroesophageal reflux disease)   . Glaucoma 06/26/2011  . Glaucoma of both eyes   . Hearing loss, sensorineural 03/04/2014   [facility]; 02/26/14. There is not a medical or surgical treatment that  would benefit her type of hearing loss. We discussed amplification in an overview fashion.    Marland Kitchen History of fall   . Insomnia, unspecified   . Macular degeneration 06/26/2011  . Migraines   . Nasal bone fracture 03/30/2014  . Osteoporosis   . Osteoporosis 02/26/2011  . Presence of right artificial hip joint   .  Protein-calorie malnutrition, severe ([place]) 12/10/2012  . S/P ORIF (open reduction internal fixation) fracture 01/08/2016  . Schatzki's ring 01/14/2015  . Scoliosis   . Substance abuse ([place])    Benbzos and Narcotics, she does not get any of those medicines from [facility], we have empathetically told her that.  . Vertigo   . Vitamin B deficiency   . Vitamin D deficiency 08/25/2016  . Weight loss    Past Surgical History:  Procedure Laterality Date  . ABDOMINAL HYSTERECTOMY    . CATARACT EXTRACTION    . CHOLECYSTECTOMY    . ESOPHAGOGASTRODUODENOSCOPY (EGD) WITH PROPOFOL N/A 01/13/2015   Procedure: ESOPHAGOGASTRODUODENOSCOPY (EGD) WITH PROPOFOL;  Surgeon: [name], MD;  Location: [facility];  Service: Endoscopy;  Laterality: N/A;  . HIP ARTHROPLASTY Right 12/21/2015   Procedure: ARTHROPLASTY BIPOLAR HIP (HEMIARTHROPLASTY);  Surgeon: [name], MD;  Location: [facility];  Service: Orthopedics;  Laterality: Right;  . PARTIAL HYSTERECTOMY      Allergies  Allergen Reactions  . Amoxicillin     Per daughter  . Ampicillin     Per daughter  . Depakote [Valproic Acid]     DOES NOT WANT HER TO TAKE IT==Makes her hair fall out= per daughter who is poa  . Macrobid [Nitrofurantoin Macrocrystal] Rash    Per MAR  . Penicillins     Per daughter  . Acetaminophen Other (See Comments)    jaundice  . Azithromycin Other (See Comments)    Per MAR  . Butalbital   . Caffeine   . Doxycycline Other (See Comments)    Per MAR  . Escitalopram Oxalate Other (See Comments)    Per MAR  . Fioricet [Butalbital-Apap-Caffeine] Other (See Comments)    Drunk. Per MAR  . Latex Other (See Comments)    Per MAR  . Levofloxacin Other (See Comments)    Per MAR  . Levofloxacin   . Nsaids Other (See Comments)    This is not an allergy. The patient was told by Dr. Rockne Menghini to avoid NSAIDs and stop Vicoprofen in to 2014 to avoid nephrotoxicity.  Per MAR.  Marland Kitchen Oxycodone Other (See Comments)    reaction to  synthetic codeine Per MAR  . Restasis [Cyclosporine] Other (See Comments)    Per MAR  . Sulfa Antibiotics Other (See Comments)    Per MAR  . Biaxin [Clarithromycin] Rash    With burning sensation Per [facility]    Outpatient Encounter Medications as of 09/01/2018  Medication Sig  . acetaminophen (TYLENOL) 325 MG tablet Take 650 mg by mouth every 6 (six) hours as needed for headache.  Marland Kitchen acetaminophen (TYLENOL) 500 MG tablet Give 2 tablets ( 1,000 mg total ) by mouth nightly for pain  . albuterol (PROVENTIL HFA;VENTOLIN HFA) 108 (90 BASE) MCG/ACT inhaler Inhale 1 puff into the lungs every 6 (six) hours as needed for shortness of breath.   . Biotin 5000 MCG CAPS Take 1 capsule by mouth daily.   . Cholecalciferol (VITAMIN D) 1000 UNITS capsule Take 1,000 Units by mouth daily with breakfast.   . Cranberry 450 MG TABS Take 1 tablet by mouth 2 (two) times  daily.   . docusate sodium (COLACE) 100 MG capsule Take 100 mg by mouth 2 (two) times daily.  . dorzolamide-timolol (COSOPT) 22.3-6.8 MG/ML ophthalmic solution Place 1 drop into both eyes 2 (two) times daily.  Marland Kitchen ENSURE (ENSURE) Take 237 mLs by mouth 4 (four) times daily. D/t weight loss  . ferrous sulfate 325 (65 FE) MG tablet Take 325 mg by mouth daily.   . fludrocortisone (FLORINEF) 0.1 MG tablet Take 0.1 mg by mouth every morning. for orthostatic hypotension  . FLUoxetine (PROZAC) 10 MG tablet Take 10 mg by mouth daily.  Marland Kitchen lactulose (CHRONULAC) 10 GM/15ML solution Take 15 mLs (10 g total) by mouth daily as needed for severe constipation.  Marland Kitchen loratadine (CLARITIN) 10 MG tablet Take 10 mg by mouth daily.   . mirtazapine (REMERON) 15 MG tablet Take 7.5 mg by mouth at bedtime.  Marland Kitchen neomycin-bacitracin-polymyxin (NEOSPORIN) [phone] ointment TRIPLE ANTIBIOTIC OINTMENT APPLY AROUND MOUTH AND NOSE ONCE DAILY  . nitroGLYCERIN (NITROSTAT) 0.4 MG SL tablet Place 0.4 mg under the tongue every 5 (five) minutes as needed for chest pain. May use 3 times  .  Polyethyl Glycol-Propyl Glycol (SYSTANE) 0.4-0.3 % SOLN Place 2 drops into both eyes every 2 (two) hours as needed (dry eyes).   . polyethylene glycol (MIRALAX / GLYCOLAX) packet Take 17 g by mouth daily.   . potassium chloride (K-DUR) 20 MEQ tablet Take 1 tablet (20 mEq total) by mouth daily.  . QUEtiapine (SEROQUEL) 100 MG tablet Take 100 mg by mouth at bedtime.  . senna (SENOKOT) 8.6 MG tablet Take 2 tablets by mouth 2 (two) times daily as needed for constipation.  . travoprost, benzalkonium, (TRAVATAN) 0.004 % ophthalmic solution Place 1 drop into both eyes at bedtime.  . vitamin B-12 (CYANOCOBALAMIN) 1000 MCG tablet Take 1,500 mcg by mouth daily. gummies   No facility-administered encounter medications on file as of 09/01/2018.     Review of Systems   This is limited secondary to dementia please see HPI  Immunization History  Administered Date(s) Administered  . Influenza Split 11/13/2011  . Influenza-Unspecified 02/13/2008, 11/30/2013, 12/20/2016  . PPD Test 12/15/2015  . Pneumococcal Conjugate-13 10/16/2016  . Pneumococcal-Unspecified 02/13/2008  . Td 02/12/2009   Pertinent  Health Maintenance Due  Topic Date Due  . INFLUENZA VACCINE  09/13/2018  . DEXA SCAN  Completed  . PNA vac Low Risk Adult  Completed   Fall Risk  08/27/2017 08/22/2016  Falls in the past year? No Yes  Number falls in past yr: - 2 or more  Injury with Fall? - Yes  Comment - back of head   Functional Status Survey:    Vitals:   09/01/18 1116  BP: 98/65  Pulse: 73  Resp: 18  Temp: 98.2 F (36.8 C)  TempSrc: Oral  Weight: 78 lb (35.4 kg)  Height: 4\' 11"  (1.499 m)  Baseline systolics per chart review appear to run from the lower 100s to the mid 90s Most recent weight is 78 pounds Body mass index is 15.75 kg/m. Physical Exam General this is a pleasant somewhat frail-appearing elderly female in no distress.  Her skin is warm and dry.  Eyes visual acuity appears to be intact sclera and  conjunctive are clear.  Oropharynx is clear mucous membranes moist.  Chest is clear to auscultation with somewhat shallow air entry-this is complicated with her history of scoliosis.  Heart is regular rate and rhythm without murmur gallop or rub she does not have significant edema.  Abdomen  is soft nontender with positive bowel sounds.  Musculoskeletal is able to move all extremities x4 with general frailty.  Neurologic as noted above her speech is clear could not really appreciate lateralizing findings.  Psych she is oriented to self she is pleasant follows simple verbal commands well.  When I left the room she made sure that I could place her trash can so she could reach it   Labs reviewed: Recent Labs    06/06/18 0646 06/07/18 0317 06/08/18 0317 06/08/18 1052 06/18/18 06/20/18  NA 145 144 142  --  141  --   K 2.4* 2.9* 2.4* 2.7* 3.7  --   CL 111 114* 106  --   --   --   CO2 21* 19* 21*  --   --   --   GLUCOSE 100* 110* 89  --   --   --   BUN 15 12 11   --  17  --   CREATININE 0.67 0.58 0.70  --  0.4* 0.6  CALCIUM 8.5* 8.7* 9.0  --   --   --   MG  --   --  1.6*  --   --   --    Recent Labs    06/04/18 1121 06/05/18 0320  AST 22 22  ALT 16 16  ALKPHOS 66 59  BILITOT 0.5 0.5  PROT 5.9* 5.7*  ALBUMIN 2.8* 2.5*   Recent Labs    06/04/18 1121  06/06/18 0646 06/07/18 0317 06/08/18 0317  WBC 10.6*   < > 8.6 7.4 7.6  NEUTROABS 8.6*  --   --   --   --   HGB 11.4*   < > 11.3* 11.9* 12.8  HCT 36.8   < > 34.9* 36.1 38.8  MCV 97.6   < > 93.1 90.7 90.2  PLT 163   < > 167 205 200   < > = values in this interval not displayed.   Lab Results  Component Value Date   TSH 0.59 07/31/2017   Lab Results  Component Value Date   HGBA1C 5.4 06/11/2016   Lab Results  Component Value Date   CHOL 134 07/31/2017   HDL 54 06/11/2016   LDLCALC 61 07/31/2017   TRIG 102 07/31/2017    Significant Diagnostic Results in last 30 days:  No results found.  Assessment/Plan   #1 history of dementia--appears to be doing relatively well with supportive care she is on aggressive supplementation with a history of protein calorie malnutrition.  She is on Remeron for appetite stimulation also continues on Seroquel for apparent history of bipolar disorder-.   noted Nursing has not noted any recent behaviors to my knowledge.  2.  History of hypotension she continues on Florinef- as above systolic s run from the 54Y to low 100s- apparently there are no overt symptoms noted at this point will monitor.  3.-  History of iron deficiency anemia she continues on iron last hemoglobin was 12.8 in late April will try to update this.  4.-  History of chronic kidney disease  this appears stable with a creatinine of 0.6 back in early May-we will try to update ithis  6 history of hypokalemia per chart review at times she has had potassiums in the high twos-she is on potassium supplementation 20 mEq a day- 7 in May was normal at 3.7-we will update.  --#7 History of COPD  does have PRN albuterol and apparently this is been stable and well-controlled  #8 history  of MRSA bacteremia which required hospitalization back in May-this responded well to vancomycin and Rocephin she was treated for UTI-she continues on cranberry for prophylaxis  #9- history of idiopathic scoliosis- she appears to do well with supportive care.   #10 history of constipation she does have orders for lactulose and MiraLAX apparently this has stabilized nursing does not report concerns today  11 depression- she does continue on Remeron she is also on  Prozac which is started in hopes of helping with her scratching episodes apparently somewhat limited success will monitor for now   #12 glaucoma continues on topical drops including timolol and Cosopt apparently these were stopped at one point secondary to patient refusal but have been restarted  CPT- [id number]- of note greater than 35 minutes spent assessing patient-  discussing her status with nursing staff-reviewing her chart and labs coordinating and formulating a plan of care for numerous diagnoses- of note greater than 50% of time spent coordinating a plan of care with input as noted above

## 2018-09-01 NOTE — Progress Notes (Deleted)
Location:  [facility] Room Number: 304-D Place of Service:  SNF [phone]) Provider:  Granville Lewis, PA-C  [name], MD  Patient Care Team: [name], MD as PCP - General (Internal Medicine) [name], MD (Gastroenterology) [name], NP (Obstetrics and Gynecology)  Extended Emergency Contact Information Primary Emergency Contact: [name],[name] Address: [place], [place] Phone: [phone] Mobile Phone: [phone] Relation: Daughter Secondary Emergency Contact: [place] Phone: [phone] Mobile Phone: [phone] Relation: Daughter  Code Status:  DNR Goals of care: Advanced Directive information Advanced Directives 06/09/2018  Does Patient Have a Medical Advance Directive? Yes  Type of Advance Directive Out of facility DNR (pink MOST or yellow form)  Does patient want to make changes to medical advance directive? No - Patient declined  Copy of [place] in Chart? -  Would patient like information on creating a medical advance directive? -  Pre-existing out of facility DNR order (yellow form or pink MOST form) Yellow form placed in chart (order not valid for inpatient use)     Chief Complaint  Patient presents with  . Medical Management of Chronic Issues    Routine [facility] visit  Medical management of chronic medical conditions including dementia-COPD- hypertension-hypotension- chronic kidney disease- allergic rhinitis-iron deficiency anemia- protein calorie malnutrition  HPI:  Pt is an 85 y.o. female seen today for medical management of chronic diseases as noted above. Nursing does not report any recent acute issue  She appears to do relatively well with supportive care with a history of dementia- she does continue on Seroquel 100 mg daily  She is also been started on Prozac for a history  of scratching her face.  At times is noted her blood pressure is somewhat low in the 90s but she is fairly asymptomatic-he is on Florinef with a history of orthostatic hypotension.  In regards to COPD this appears stable on as needed albuterol.   also continues on an antibiotic cream for eczema of her face.   s-she continues to have somewhat of a spotty appetite it appears she is lost about 5 to 7 pounds since April.   is on aggressive supplementation including Ensure 4 times a da currently she is sitting in her chair comfortably she is actually eating her lunch-i--she  appears she is eaten about a third of her plate so far y is also on Remeron to help with appetite stimulation     Past Medical History:  Diagnosis Date  . Anxiety   . Arterial tortuosity (aorta) 12/11/2012  . Asthma   . Chronic kidney disease, stage IV (severe) ([place]) 12/11/2012  . Chronic mental illness   . CKD (chronic kidney disease), stage III ([place]) 01/14/2015  . Closed hip fracture requiring operative repair, right, with routine healing, subsequent encounter 12/19/2015  . Colitis   . COPD (chronic obstructive pulmonary disease) ([place])   . Decreased rectal sphincter tone 11/21/2012  . Depression   . Drug-seeking behavior 02/26/2011   Use of multiple md to obtain benzos/narcotics pt must sign contract to receive controlled meds.   Marland Kitchen Dysphagia 01/11/2015  . GERD (gastroesophageal reflux disease)   . Glaucoma 06/26/2011  . Glaucoma of both eyes   . Hearing loss, sensorineural 03/04/2014   [facility]; 02/26/14. There is not a medical or surgical treatment that would benefit her type  of hearing loss. We discussed amplification in an overview fashion.    Marland Kitchen History of fall   . Insomnia, unspecified   . Macular degeneration 06/26/2011  . Migraines   . Nasal bone fracture 03/30/2014  . Osteoporosis   . Osteoporosis 02/26/2011  . Presence of right artificial hip joint   . Protein-calorie malnutrition, severe ([place])  12/10/2012  . S/P ORIF (open reduction internal fixation) fracture 01/08/2016  . Schatzki's ring 01/14/2015  . Scoliosis   . Substance abuse ([place])    Benbzos and Narcotics, she does not get any of those medicines from [facility], we have empathetically told her that.  . Vertigo   . Vitamin B deficiency   . Vitamin D deficiency 08/25/2016  . Weight loss    Past Surgical History:  Procedure Laterality Date  . ABDOMINAL HYSTERECTOMY    . CATARACT EXTRACTION    . CHOLECYSTECTOMY    . ESOPHAGOGASTRODUODENOSCOPY (EGD) WITH PROPOFOL N/A 01/13/2015   Procedure: ESOPHAGOGASTRODUODENOSCOPY (EGD) WITH PROPOFOL;  Surgeon: [name], MD;  Location: [facility];  Service: Endoscopy;  Laterality: N/A;  . HIP ARTHROPLASTY Right 12/21/2015   Procedure: ARTHROPLASTY BIPOLAR HIP (HEMIARTHROPLASTY);  Surgeon: [name], MD;  Location: [facility];  Service: Orthopedics;  Laterality: Right;  . PARTIAL HYSTERECTOMY      Allergies  Allergen Reactions  . Amoxicillin     Per daughter  . Ampicillin     Per daughter  . Depakote [Valproic Acid]     DOES NOT WANT HER TO TAKE IT==Makes her hair fall out= per daughter who is poa  . Macrobid [Nitrofurantoin Macrocrystal] Rash    Per MAR  . Penicillins     Per daughter  . Acetaminophen Other (See Comments)    jaundice  . Azithromycin Other (See Comments)    Per MAR  . Butalbital   . Caffeine   . Doxycycline Other (See Comments)    Per MAR  . Escitalopram Oxalate Other (See Comments)    Per MAR  . Fioricet [Butalbital-Apap-Caffeine] Other (See Comments)    Drunk. Per MAR  . Latex Other (See Comments)    Per MAR  . Levofloxacin Other (See Comments)    Per MAR  . Levofloxacin   . Nsaids Other (See Comments)    This is not an allergy. The patient was told by Dr. Rockne Menghini to avoid NSAIDs and stop Vicoprofen in to 2014 to avoid nephrotoxicity.  Per MAR.  Marland Kitchen Oxycodone Other (See Comments)    reaction to synthetic codeine Per MAR  . Restasis  [Cyclosporine] Other (See Comments)    Per MAR  . Sulfa Antibiotics Other (See Comments)    Per MAR  . Biaxin [Clarithromycin] Rash    With burning sensation Per [facility]    Outpatient Encounter Medications as of 09/01/2018  Medication Sig  . acetaminophen (TYLENOL) 325 MG tablet Take 650 mg by mouth every 6 (six) hours as needed for headache.  Marland Kitchen acetaminophen (TYLENOL) 500 MG tablet Give 2 tablets ( 1,000 mg total ) by mouth nightly for pain  . albuterol (PROVENTIL HFA;VENTOLIN HFA) 108 (90 BASE) MCG/ACT inhaler Inhale 1 puff into the lungs every 6 (six) hours as needed for shortness of breath.   . Biotin 5000 MCG CAPS Take 1 capsule by mouth daily.   . Cholecalciferol (VITAMIN D) 1000 UNITS capsule Take 1,000 Units by mouth daily with breakfast.   . Cranberry 450 MG TABS Take 1 tablet by mouth 2 (two) times daily.   Marland Kitchen  docusate sodium (COLACE) 100 MG capsule Take 100 mg by mouth 2 (two) times daily.  . dorzolamide-timolol (COSOPT) 22.3-6.8 MG/ML ophthalmic solution Place 1 drop into both eyes 2 (two) times daily.  Marland Kitchen ENSURE (ENSURE) Take 237 mLs by mouth 4 (four) times daily. D/t weight loss  . ferrous sulfate 325 (65 FE) MG tablet Take 325 mg by mouth daily.   . fludrocortisone (FLORINEF) 0.1 MG tablet Take 0.1 mg by mouth every morning. for orthostatic hypotension  . FLUoxetine (PROZAC) 10 MG tablet Take 10 mg by mouth daily.  Marland Kitchen lactulose (CHRONULAC) 10 GM/15ML solution Take 15 mLs (10 g total) by mouth daily as needed for severe constipation.  Marland Kitchen loratadine (CLARITIN) 10 MG tablet Take 10 mg by mouth daily.   . mirtazapine (REMERON) 15 MG tablet Take 7.5 mg by mouth at bedtime.  Marland Kitchen neomycin-bacitracin-polymyxin (NEOSPORIN) [phone] ointment TRIPLE ANTIBIOTIC OINTMENT APPLY AROUND MOUTH AND NOSE ONCE DAILY  . nitroGLYCERIN (NITROSTAT) 0.4 MG SL tablet Place 0.4 mg under the tongue every 5 (five) minutes as needed for chest pain. May use 3 times  . Polyethyl Glycol-Propyl Glycol (SYSTANE)  0.4-0.3 % SOLN Place 2 drops into both eyes every 2 (two) hours as needed (dry eyes).   . polyethylene glycol (MIRALAX / GLYCOLAX) packet Take 17 g by mouth daily.   . potassium chloride (K-DUR) 20 MEQ tablet Take 1 tablet (20 mEq total) by mouth daily.  . QUEtiapine (SEROQUEL) 100 MG tablet Take 100 mg by mouth at bedtime.  . senna (SENOKOT) 8.6 MG tablet Take 2 tablets by mouth 2 (two) times daily as needed for constipation.  . travoprost, benzalkonium, (TRAVATAN) 0.004 % ophthalmic solution Place 1 drop into both eyes at bedtime.  . vitamin B-12 (CYANOCOBALAMIN) 1000 MCG tablet Take 1,500 mcg by mouth daily. gummies   No facility-administered encounter medications on file as of 09/01/2018.     Review of Systems  Immunization History  Administered Date(s) Administered  . Influenza Split 11/13/2011  . Influenza-Unspecified 02/13/2008, 11/30/2013, 12/20/2016  . PPD Test 12/15/2015  . Pneumococcal Conjugate-13 10/16/2016  . Pneumococcal-Unspecified 02/13/2008  . Td 02/12/2009   Pertinent  Health Maintenance Due  Topic Date Due  . INFLUENZA VACCINE  09/13/2018  . DEXA SCAN  Completed  . PNA vac Low Risk Adult  Completed   Fall Risk  08/27/2017 08/22/2016  Falls in the past year? No Yes  Number falls in past yr: - 2 or more  Injury with Fall? - Yes  Comment - back of head   Functional Status Survey:    Vitals:   09/01/18 1116  BP: 98/65  Pulse: 73  Resp: 18  Temp: 98.2 F (36.8 C)  TempSrc: Oral  Weight: 78 lb (35.4 kg)  Height: 4\' 11"  (1.499 m)   Body mass index is 15.75 kg/m. Physical Exam  Labs reviewed: Recent Labs    06/06/18 0646 06/07/18 0317 06/08/18 0317 06/08/18 1052 06/18/18 06/20/18  NA 145 144 142  --  141  --   K 2.4* 2.9* 2.4* 2.7* 3.7  --   CL 111 114* 106  --   --   --   CO2 21* 19* 21*  --   --   --   GLUCOSE 100* 110* 89  --   --   --   BUN 15 12 11   --  17  --   CREATININE 0.67 0.58 0.70  --  0.4* 0.6  CALCIUM 8.5* 8.7* 9.0  --   --   --  MG  --   --  1.6*  --   --   --    Recent Labs    06/04/18 1121 06/05/18 0320  AST 22 22  ALT 16 16  ALKPHOS 66 59  BILITOT 0.5 0.5  PROT 5.9* 5.7*  ALBUMIN 2.8* 2.5*   Recent Labs    06/04/18 1121  06/06/18 0646 06/07/18 0317 06/08/18 0317  WBC 10.6*   < > 8.6 7.4 7.6  NEUTROABS 8.6*  --   --   --   --   HGB 11.4*   < > 11.3* 11.9* 12.8  HCT 36.8   < > 34.9* 36.1 38.8  MCV 97.6   < > 93.1 90.7 90.2  PLT 163   < > 167 205 200   < > = values in this interval not displayed.   Lab Results  Component Value Date   TSH 0.59 07/31/2017   Lab Results  Component Value Date   HGBA1C 5.4 06/11/2016   Lab Results  Component Value Date   CHOL 134 07/31/2017   HDL 54 06/11/2016   LDLCALC 61 07/31/2017   TRIG 102 07/31/2017    Significant Diagnostic Results in last 30 days:  No results found.  Assessment/Plan There are no diagnoses linked to this encounter.   Family/ staff Communication: ***  Labs/tests ordered:  ***

## 2018-09-02 DIAGNOSIS — F329 Major depressive disorder, single episode, unspecified: Secondary | ICD-10-CM | POA: Diagnosis not present

## 2018-09-02 DIAGNOSIS — R1312 Dysphagia, oropharyngeal phase: Secondary | ICD-10-CM | POA: Diagnosis not present

## 2018-09-02 DIAGNOSIS — M6281 Muscle weakness (generalized): Secondary | ICD-10-CM | POA: Diagnosis not present

## 2018-09-02 DIAGNOSIS — I129 Hypertensive chronic kidney disease with stage 1 through stage 4 chronic kidney disease, or unspecified chronic kidney disease: Secondary | ICD-10-CM | POA: Diagnosis not present

## 2018-09-02 DIAGNOSIS — J449 Chronic obstructive pulmonary disease, unspecified: Secondary | ICD-10-CM | POA: Diagnosis not present

## 2018-09-02 DIAGNOSIS — R278 Other lack of coordination: Secondary | ICD-10-CM | POA: Diagnosis not present

## 2018-09-02 DIAGNOSIS — I10 Essential (primary) hypertension: Secondary | ICD-10-CM | POA: Diagnosis not present

## 2018-09-02 DIAGNOSIS — D649 Anemia, unspecified: Secondary | ICD-10-CM | POA: Diagnosis not present

## 2018-09-02 LAB — BASIC METABOLIC PANEL
BUN: 18 (ref 4–21)
Creatinine: 0.7 (ref 0.5–1.1)
Glucose: 95
Potassium: 4.2 (ref 3.4–5.3)
Sodium: 142 (ref 137–147)

## 2018-09-02 LAB — CBC AND DIFFERENTIAL
HCT: 38 (ref 36–46)
Hemoglobin: 12.9 (ref 12.0–16.0)
Neutrophils Absolute: 3
Platelets: 224 (ref 150–399)
WBC: 5.5

## 2018-09-02 LAB — HEPATIC FUNCTION PANEL
ALT: 9 (ref 7–35)
AST: 17 (ref 13–35)
Alkaline Phosphatase: 98 (ref 25–125)
Bilirubin, Total: 0.2

## 2018-09-03 DIAGNOSIS — R278 Other lack of coordination: Secondary | ICD-10-CM | POA: Diagnosis not present

## 2018-09-03 DIAGNOSIS — F329 Major depressive disorder, single episode, unspecified: Secondary | ICD-10-CM | POA: Diagnosis not present

## 2018-09-03 DIAGNOSIS — J449 Chronic obstructive pulmonary disease, unspecified: Secondary | ICD-10-CM | POA: Diagnosis not present

## 2018-09-03 DIAGNOSIS — R1312 Dysphagia, oropharyngeal phase: Secondary | ICD-10-CM | POA: Diagnosis not present

## 2018-09-03 DIAGNOSIS — M6281 Muscle weakness (generalized): Secondary | ICD-10-CM | POA: Diagnosis not present

## 2018-09-04 DIAGNOSIS — R1312 Dysphagia, oropharyngeal phase: Secondary | ICD-10-CM | POA: Diagnosis not present

## 2018-09-04 DIAGNOSIS — J449 Chronic obstructive pulmonary disease, unspecified: Secondary | ICD-10-CM | POA: Diagnosis not present

## 2018-09-04 DIAGNOSIS — M6281 Muscle weakness (generalized): Secondary | ICD-10-CM | POA: Diagnosis not present

## 2018-09-04 DIAGNOSIS — F329 Major depressive disorder, single episode, unspecified: Secondary | ICD-10-CM | POA: Diagnosis not present

## 2018-09-04 DIAGNOSIS — R278 Other lack of coordination: Secondary | ICD-10-CM | POA: Diagnosis not present

## 2018-09-05 DIAGNOSIS — M6281 Muscle weakness (generalized): Secondary | ICD-10-CM | POA: Diagnosis not present

## 2018-09-05 DIAGNOSIS — R278 Other lack of coordination: Secondary | ICD-10-CM | POA: Diagnosis not present

## 2018-09-05 DIAGNOSIS — F329 Major depressive disorder, single episode, unspecified: Secondary | ICD-10-CM | POA: Diagnosis not present

## 2018-09-05 DIAGNOSIS — J449 Chronic obstructive pulmonary disease, unspecified: Secondary | ICD-10-CM | POA: Diagnosis not present

## 2018-09-05 DIAGNOSIS — R1312 Dysphagia, oropharyngeal phase: Secondary | ICD-10-CM | POA: Diagnosis not present

## 2018-09-08 DIAGNOSIS — F329 Major depressive disorder, single episode, unspecified: Secondary | ICD-10-CM | POA: Diagnosis not present

## 2018-09-08 DIAGNOSIS — J449 Chronic obstructive pulmonary disease, unspecified: Secondary | ICD-10-CM | POA: Diagnosis not present

## 2018-09-08 DIAGNOSIS — R278 Other lack of coordination: Secondary | ICD-10-CM | POA: Diagnosis not present

## 2018-09-08 DIAGNOSIS — M6281 Muscle weakness (generalized): Secondary | ICD-10-CM | POA: Diagnosis not present

## 2018-09-08 DIAGNOSIS — R1312 Dysphagia, oropharyngeal phase: Secondary | ICD-10-CM | POA: Diagnosis not present

## 2018-09-09 DIAGNOSIS — R278 Other lack of coordination: Secondary | ICD-10-CM | POA: Diagnosis not present

## 2018-09-09 DIAGNOSIS — Z20828 Contact with and (suspected) exposure to other viral communicable diseases: Secondary | ICD-10-CM | POA: Diagnosis not present

## 2018-09-09 DIAGNOSIS — F329 Major depressive disorder, single episode, unspecified: Secondary | ICD-10-CM | POA: Diagnosis not present

## 2018-09-09 DIAGNOSIS — R1312 Dysphagia, oropharyngeal phase: Secondary | ICD-10-CM | POA: Diagnosis not present

## 2018-09-09 DIAGNOSIS — M6281 Muscle weakness (generalized): Secondary | ICD-10-CM | POA: Diagnosis not present

## 2018-09-09 DIAGNOSIS — J449 Chronic obstructive pulmonary disease, unspecified: Secondary | ICD-10-CM | POA: Diagnosis not present

## 2018-09-10 DIAGNOSIS — F428 Other obsessive-compulsive disorder: Secondary | ICD-10-CM | POA: Diagnosis not present

## 2018-09-10 DIAGNOSIS — M6281 Muscle weakness (generalized): Secondary | ICD-10-CM | POA: Diagnosis not present

## 2018-09-10 DIAGNOSIS — J449 Chronic obstructive pulmonary disease, unspecified: Secondary | ICD-10-CM | POA: Diagnosis not present

## 2018-09-10 DIAGNOSIS — R1312 Dysphagia, oropharyngeal phase: Secondary | ICD-10-CM | POA: Diagnosis not present

## 2018-09-10 DIAGNOSIS — R441 Visual hallucinations: Secondary | ICD-10-CM | POA: Diagnosis not present

## 2018-09-10 DIAGNOSIS — F0391 Unspecified dementia with behavioral disturbance: Secondary | ICD-10-CM | POA: Diagnosis not present

## 2018-09-10 DIAGNOSIS — F329 Major depressive disorder, single episode, unspecified: Secondary | ICD-10-CM | POA: Diagnosis not present

## 2018-09-10 DIAGNOSIS — R278 Other lack of coordination: Secondary | ICD-10-CM | POA: Diagnosis not present

## 2018-09-11 DIAGNOSIS — R1312 Dysphagia, oropharyngeal phase: Secondary | ICD-10-CM | POA: Diagnosis not present

## 2018-09-11 DIAGNOSIS — M6281 Muscle weakness (generalized): Secondary | ICD-10-CM | POA: Diagnosis not present

## 2018-09-11 DIAGNOSIS — F329 Major depressive disorder, single episode, unspecified: Secondary | ICD-10-CM | POA: Diagnosis not present

## 2018-09-11 DIAGNOSIS — R278 Other lack of coordination: Secondary | ICD-10-CM | POA: Diagnosis not present

## 2018-09-11 DIAGNOSIS — J449 Chronic obstructive pulmonary disease, unspecified: Secondary | ICD-10-CM | POA: Diagnosis not present

## 2018-09-12 DIAGNOSIS — F329 Major depressive disorder, single episode, unspecified: Secondary | ICD-10-CM | POA: Diagnosis not present

## 2018-09-12 DIAGNOSIS — J449 Chronic obstructive pulmonary disease, unspecified: Secondary | ICD-10-CM | POA: Diagnosis not present

## 2018-09-12 DIAGNOSIS — R1312 Dysphagia, oropharyngeal phase: Secondary | ICD-10-CM | POA: Diagnosis not present

## 2018-09-12 DIAGNOSIS — R278 Other lack of coordination: Secondary | ICD-10-CM | POA: Diagnosis not present

## 2018-09-12 DIAGNOSIS — M6281 Muscle weakness (generalized): Secondary | ICD-10-CM | POA: Diagnosis not present

## 2018-09-14 DIAGNOSIS — J449 Chronic obstructive pulmonary disease, unspecified: Secondary | ICD-10-CM | POA: Diagnosis not present

## 2018-09-14 DIAGNOSIS — M6281 Muscle weakness (generalized): Secondary | ICD-10-CM | POA: Diagnosis not present

## 2018-09-14 DIAGNOSIS — R1312 Dysphagia, oropharyngeal phase: Secondary | ICD-10-CM | POA: Diagnosis not present

## 2018-09-14 DIAGNOSIS — F329 Major depressive disorder, single episode, unspecified: Secondary | ICD-10-CM | POA: Diagnosis not present

## 2018-09-15 DIAGNOSIS — M6281 Muscle weakness (generalized): Secondary | ICD-10-CM | POA: Diagnosis not present

## 2018-09-15 DIAGNOSIS — R1312 Dysphagia, oropharyngeal phase: Secondary | ICD-10-CM | POA: Diagnosis not present

## 2018-09-15 DIAGNOSIS — J449 Chronic obstructive pulmonary disease, unspecified: Secondary | ICD-10-CM | POA: Diagnosis not present

## 2018-09-15 DIAGNOSIS — F329 Major depressive disorder, single episode, unspecified: Secondary | ICD-10-CM | POA: Diagnosis not present

## 2018-09-16 DIAGNOSIS — Z20828 Contact with and (suspected) exposure to other viral communicable diseases: Secondary | ICD-10-CM | POA: Diagnosis not present

## 2018-09-16 DIAGNOSIS — M6281 Muscle weakness (generalized): Secondary | ICD-10-CM | POA: Diagnosis not present

## 2018-09-16 DIAGNOSIS — F329 Major depressive disorder, single episode, unspecified: Secondary | ICD-10-CM | POA: Diagnosis not present

## 2018-09-16 DIAGNOSIS — J449 Chronic obstructive pulmonary disease, unspecified: Secondary | ICD-10-CM | POA: Diagnosis not present

## 2018-09-16 DIAGNOSIS — R1312 Dysphagia, oropharyngeal phase: Secondary | ICD-10-CM | POA: Diagnosis not present

## 2018-09-17 DIAGNOSIS — J449 Chronic obstructive pulmonary disease, unspecified: Secondary | ICD-10-CM | POA: Diagnosis not present

## 2018-09-17 DIAGNOSIS — M6281 Muscle weakness (generalized): Secondary | ICD-10-CM | POA: Diagnosis not present

## 2018-09-17 DIAGNOSIS — F329 Major depressive disorder, single episode, unspecified: Secondary | ICD-10-CM | POA: Diagnosis not present

## 2018-09-17 DIAGNOSIS — R1312 Dysphagia, oropharyngeal phase: Secondary | ICD-10-CM | POA: Diagnosis not present

## 2018-09-18 DIAGNOSIS — J449 Chronic obstructive pulmonary disease, unspecified: Secondary | ICD-10-CM | POA: Diagnosis not present

## 2018-09-18 DIAGNOSIS — M6281 Muscle weakness (generalized): Secondary | ICD-10-CM | POA: Diagnosis not present

## 2018-09-18 DIAGNOSIS — R1312 Dysphagia, oropharyngeal phase: Secondary | ICD-10-CM | POA: Diagnosis not present

## 2018-09-18 DIAGNOSIS — F329 Major depressive disorder, single episode, unspecified: Secondary | ICD-10-CM | POA: Diagnosis not present

## 2018-09-19 DIAGNOSIS — R1312 Dysphagia, oropharyngeal phase: Secondary | ICD-10-CM | POA: Diagnosis not present

## 2018-09-19 DIAGNOSIS — F329 Major depressive disorder, single episode, unspecified: Secondary | ICD-10-CM | POA: Diagnosis not present

## 2018-09-19 DIAGNOSIS — J449 Chronic obstructive pulmonary disease, unspecified: Secondary | ICD-10-CM | POA: Diagnosis not present

## 2018-09-19 DIAGNOSIS — M6281 Muscle weakness (generalized): Secondary | ICD-10-CM | POA: Diagnosis not present

## 2018-09-23 DIAGNOSIS — Z20828 Contact with and (suspected) exposure to other viral communicable diseases: Secondary | ICD-10-CM | POA: Diagnosis not present

## 2018-09-24 ENCOUNTER — Non-Acute Institutional Stay (SKILLED_NURSING_FACILITY): Payer: Medicare Other | Admitting: Internal Medicine

## 2018-09-24 ENCOUNTER — Encounter: Payer: Self-pay | Admitting: Internal Medicine

## 2018-09-24 DIAGNOSIS — N390 Urinary tract infection, site not specified: Secondary | ICD-10-CM

## 2018-09-24 DIAGNOSIS — N183 Chronic kidney disease, stage 3 unspecified: Secondary | ICD-10-CM

## 2018-09-24 DIAGNOSIS — F0281 Dementia in other diseases classified elsewhere with behavioral disturbance: Secondary | ICD-10-CM

## 2018-09-24 DIAGNOSIS — G301 Alzheimer's disease with late onset: Secondary | ICD-10-CM | POA: Diagnosis not present

## 2018-09-24 DIAGNOSIS — J449 Chronic obstructive pulmonary disease, unspecified: Secondary | ICD-10-CM | POA: Diagnosis not present

## 2018-09-24 DIAGNOSIS — I951 Orthostatic hypotension: Secondary | ICD-10-CM | POA: Diagnosis not present

## 2018-09-24 DIAGNOSIS — F02818 Dementia in other diseases classified elsewhere, unspecified severity, with other behavioral disturbance: Secondary | ICD-10-CM

## 2018-09-24 NOTE — Progress Notes (Signed)
Location:  [facility] Room Number: 304-D Place of Service:  SNF [phone]) Provider:  Granville Lewis, PA-C  [name], MD  Patient Care Team: [name], MD as PCP - General (Internal Medicine) [name], MD (Gastroenterology) [name], NP (Obstetrics and Gynecology)  Extended Emergency Contact Information Primary Emergency Contact: [name],[name] Address: [address] [place], [place] Phone: [phone] Mobile Phone: [phone] Relation: Daughter Secondary Emergency Contact: [place] Phone: [phone] Mobile Phone: [phone] Relation: Daughter  Code Status:  DNR Goals of care: Advanced Directive information Advanced Directives 09/01/2018  Does Patient Have a Medical Advance Directive? Yes  Type of Advance Directive Out of facility DNR (pink MOST or yellow form)  Does patient want to make changes to medical advance directive? No - Guardian declined  Copy of [place] in Chart? -  Would patient like information on creating a medical advance directive? -  Pre-existing out of facility DNR order (yellow form or pink MOST form) -     Chief Complaint  Patient presents with  . Medical Management of Chronic Issues    Routine [facility] visit  Medical management of chronic medical conditions including dementia- hypotension-chronic kidney disease-allergic rhinitis as well as iron deficiency anemia protein calorie malnutrition depression HPI:  Pt is an 85 y.o. female seen today for medical management of chronic diseases.  As noted above.  Nursing does not report any recent acute issues  She appears to be pretty stable with supportive care- in regards to dementia nursing does not report any acute behaviors she is on Seroquel 100 mg a day.  She was started on Prozac for scratching behaviors which apparently has helped  some.  She continues on Florinef for a history of hypotension blood pressures appear to be stable most recently 108/61-126/72-106/54 the lowest one I see is 98/68.  She does have a history of chronic kidney disease however lab done last month shows stability with a creatinine of 0.68 and a BUN of 17.6.  Regards to anemia she continues on iron hemoglobin was 12.9 last month which appears to be stable.  She does have a history of protein calorie malnutrition and at one point lost about 5 to 7 pounds since April this appears to have stabilized although we are awaiting an updated weight-she continues on numerous supplements including Ensure 4 times a day- her albumin actually was 3.8 on the lab done last month.  Currently she is lying in bed comfortably she is not speaking much and does not really follow verbal commands well but I do not believe this is any change from baseline   Past Medical History:  Diagnosis Date  . Anxiety   . Arterial tortuosity (aorta) 12/11/2012  . Asthma   . Chronic kidney disease, stage IV (severe) ([place]) 12/11/2012  . Chronic mental illness   . CKD (chronic kidney disease), stage III ([place]) 01/14/2015  . Closed hip fracture requiring operative repair, right, with routine healing, subsequent encounter 12/19/2015  . Colitis   . COPD (chronic obstructive pulmonary disease) ([place])   . Decreased rectal sphincter tone 11/21/2012  . Depression   . Drug-seeking behavior 02/26/2011   Use of multiple md to obtain benzos/narcotics pt must sign contract to receive controlled meds.   Marland Kitchen Dysphagia 01/11/2015  . GERD (gastroesophageal reflux disease)   . Glaucoma 06/26/2011  .  Glaucoma of both eyes   . Hearing loss, sensorineural 03/04/2014   [facility]; 02/26/14. There is not a medical or surgical treatment that would benefit her type of hearing loss. We discussed amplification in an overview fashion.    Marland Kitchen History of fall   . Insomnia, unspecified   . Macular degeneration  06/26/2011  . Migraines   . Nasal bone fracture 03/30/2014  . Osteoporosis   . Osteoporosis 02/26/2011  . Presence of right artificial hip joint   . Protein-calorie malnutrition, severe ([place]) 12/10/2012  . S/P ORIF (open reduction internal fixation) fracture 01/08/2016  . Schatzki's ring 01/14/2015  . Scoliosis   . Substance abuse ([place])    Benbzos and Narcotics, she does not get any of those medicines from [facility], we have empathetically told her that.  . Vertigo   . Vitamin B deficiency   . Vitamin D deficiency 08/25/2016  . Weight loss    Past Surgical History:  Procedure Laterality Date  . ABDOMINAL HYSTERECTOMY    . CATARACT EXTRACTION    . CHOLECYSTECTOMY    . ESOPHAGOGASTRODUODENOSCOPY (EGD) WITH PROPOFOL N/A 01/13/2015   Procedure: ESOPHAGOGASTRODUODENOSCOPY (EGD) WITH PROPOFOL;  Surgeon: [name], MD;  Location: [facility];  Service: Endoscopy;  Laterality: N/A;  . HIP ARTHROPLASTY Right 12/21/2015   Procedure: ARTHROPLASTY BIPOLAR HIP (HEMIARTHROPLASTY);  Surgeon: [name], MD;  Location: [facility];  Service: Orthopedics;  Laterality: Right;  . PARTIAL HYSTERECTOMY      Allergies  Allergen Reactions  . Amoxicillin     Per daughter  . Ampicillin     Per daughter  . Depakote [Valproic Acid]     DOES NOT WANT HER TO TAKE IT==Makes her hair fall out= per daughter who is poa  . Macrobid [Nitrofurantoin Macrocrystal] Rash    Per MAR  . Penicillins     Per daughter  . Acetaminophen Other (See Comments)    jaundice  . Azithromycin Other (See Comments)    Per MAR  . Butalbital   . Caffeine   . Doxycycline Other (See Comments)    Per MAR  . Escitalopram Oxalate Other (See Comments)    Per MAR  . Fioricet [Butalbital-Apap-Caffeine] Other (See Comments)    Drunk. Per MAR  . Latex Other (See Comments)    Per MAR  . Levofloxacin Other (See Comments)    Per MAR  . Levofloxacin   . Nsaids Other (See Comments)    This is not an allergy. The patient was told by  Dr. Rockne Menghini to avoid NSAIDs and stop Vicoprofen in to 2014 to avoid nephrotoxicity.  Per MAR.  Marland Kitchen Oxycodone Other (See Comments)    reaction to synthetic codeine Per MAR  . Restasis [Cyclosporine] Other (See Comments)    Per MAR  . Sulfa Antibiotics Other (See Comments)    Per MAR  . Biaxin [Clarithromycin] Rash    With burning sensation Per [facility]    Outpatient Encounter Medications as of 09/24/2018  Medication Sig  . acetaminophen (TYLENOL) 325 MG tablet Take 650 mg by mouth every 6 (six) hours as needed for headache.  Marland Kitchen acetaminophen (TYLENOL) 500 MG tablet Give 2 tablets ( 1,000 mg total ) by mouth nightly for pain  . albuterol (PROVENTIL HFA;VENTOLIN HFA) 108 (90 BASE) MCG/ACT inhaler Inhale 1 puff into the lungs every 6 (six) hours as needed for shortness of breath.   Marland Kitchen aspirin 81 MG chewable tablet Chew 81 mg by mouth daily.  . Biotin 5000 MCG CAPS  Take 1 capsule by mouth daily.   . Cholecalciferol (VITAMIN D3) 1.25 MG (50000 UT) CAPS Take 1 capsule by mouth once a week.  . Cranberry 450 MG TABS Take 1 tablet by mouth 2 (two) times daily.   Marland Kitchen docusate sodium (COLACE) 100 MG capsule Take 100 mg by mouth 2 (two) times daily.  . dorzolamide-timolol (COSOPT) 22.3-6.8 MG/ML ophthalmic solution Place 1 drop into both eyes 2 (two) times daily.  Marland Kitchen ENSURE (ENSURE) Take 237 mLs by mouth 4 (four) times daily. D/t weight loss  . ferrous sulfate 325 (65 FE) MG tablet Take 325 mg by mouth daily.   . fludrocortisone (FLORINEF) 0.1 MG tablet Take 0.1 mg by mouth every morning. for orthostatic hypotension  . FLUoxetine (PROZAC) 10 MG tablet Take 10 mg by mouth daily.  Marland Kitchen lactulose (CHRONULAC) 10 GM/15ML solution Take 15 mLs (10 g total) by mouth daily as needed for severe constipation.  Marland Kitchen loratadine (CLARITIN) 10 MG tablet Take 10 mg by mouth daily.   . mirtazapine (REMERON) 15 MG tablet Take 7.5 mg by mouth at bedtime.  Marland Kitchen neomycin-bacitracin-polymyxin (NEOSPORIN) [phone] ointment TRIPLE ANTIBIOTIC  OINTMENT APPLY AROUND MOUTH AND NOSE ONCE DAILY  . nitroGLYCERIN (NITROSTAT) 0.4 MG SL tablet Place 0.4 mg under the tongue every 5 (five) minutes as needed for chest pain. May use 3 times  . Polyethyl Glycol-Propyl Glycol (SYSTANE) 0.4-0.3 % SOLN Place 2 drops into both eyes every 2 (two) hours as needed (dry eyes).   . polyethylene glycol (MIRALAX / GLYCOLAX) packet Take 17 g by mouth daily.   . potassium chloride (K-DUR) 20 MEQ tablet Take 1 tablet (20 mEq total) by mouth daily.  . QUEtiapine (SEROQUEL) 100 MG tablet Take 100 mg by mouth at bedtime. Take along with a 25 mg tablet to = 125 mg qhs  . QUEtiapine (SEROQUEL) 25 MG tablet Take 25 mg by mouth at bedtime. Take along with a 100 mg tablet to = 125 mg qhs  . senna (SENOKOT) 8.6 MG tablet Take 2 tablets by mouth 2 (two) times daily as needed for constipation.  . travoprost, benzalkonium, (TRAVATAN) 0.004 % ophthalmic solution Place 1 drop into both eyes at bedtime.  . vitamin B-12 (CYANOCOBALAMIN) 1000 MCG tablet Take 1,500 mcg by mouth daily. 1-1/2 tablets to = 1500 mcg  . [DISCONTINUED] Cholecalciferol (VITAMIN D) 1000 UNITS capsule Take 1,000 Units by mouth daily with breakfast.    No facility-administered encounter medications on file as of 09/24/2018.     Review of Systems Is unobtainable secondary to dementia please see HPI Immunization History  Administered Date(s) Administered  . Influenza Split 11/13/2011  . Influenza-Unspecified 02/13/2008, 11/30/2013, 12/20/2016  . PPD Test 12/15/2015  . Pneumococcal Conjugate-13 10/16/2016  . Pneumococcal-Unspecified 02/13/2008  . Td 02/12/2009   Pertinent  Health Maintenance Due  Topic Date Due  . INFLUENZA VACCINE  09/13/2018  . DEXA SCAN  Completed  . PNA vac Low Risk Adult  Completed   Fall Risk  08/27/2017 08/22/2016  Falls in the past year? No Yes  Number falls in past yr: - 2 or more  Injury with Fall? - Yes  Comment - back of head   Functional Status Survey:     Vitals:   09/24/18 1007  BP: 126/72  Pulse: 69  Resp: 17  Temp: 98.1 F (36.7 C)  TempSrc: Oral  Weight: 78 lb (35.4 kg)  Height: 4\' 11"  (1.499 m)   Body mass index is 15.75 kg/m. Physical Exam  In general this is a somewhat frail elderly female in no distress lying comfortably in bed she is not talking much today but will speak occasionally.  Her skin is warm and dry.  Eyes visual acuity appears grossly intact sclera and conjunctive are clear.  Oropharynx is clear mucous membranes appear moist although the exam was limited since she did not open her mouth wide for very long- she was a little irritated with the exam.  Chest is clear to auscultation with somewhat poor respiratory effort there is no labored breathing.  Heart is regular rate and rhythm without murmur gallop or rub she does not really have significant edema.  Abdomen is soft nontender with positive bowel sounds.  Musculoskeletal Limited exam since she is in bed but appears able to move all extremities x4 she does have arthritic changes of her hands most noticeably.  Neurologic is grossly intact she is alert she does not speak much.  Psych findings consistent with dementia follows simple verbal commands at times  Labs reviewed:  September 02, 2018  Sodium 142 potassium 4.2 BUN 17.6 creatinine 0.68.  Liver function tests within normal limits of note albumin was 3.8.  WBC 5.5 hemoglobin 12.9 platelets 224   Recent Labs    06/06/18 0646 06/07/18 0317 06/08/18 0317 06/08/18 1052 06/18/18 06/20/18  NA 145 144 142  --  141  --   K 2.4* 2.9* 2.4* 2.7* 3.7  --   CL 111 114* 106  --   --   --   CO2 21* 19* 21*  --   --   --   GLUCOSE 100* 110* 89  --   --   --   BUN 15 12 11   --  17  --   CREATININE 0.67 0.58 0.70  --  0.4* 0.6  CALCIUM 8.5* 8.7* 9.0  --   --   --   MG  --   --  1.6*  --   --   --    Recent Labs    06/04/18 1121 06/05/18 0320  AST 22 22  ALT 16 16  ALKPHOS 66 59  BILITOT 0.5 0.5   PROT 5.9* 5.7*  ALBUMIN 2.8* 2.5*   Recent Labs    06/04/18 1121  06/06/18 0646 06/07/18 0317 06/08/18 0317  WBC 10.6*   < > 8.6 7.4 7.6  NEUTROABS 8.6*  --   --   --   --   HGB 11.4*   < > 11.3* 11.9* 12.8  HCT 36.8   < > 34.9* 36.1 38.8  MCV 97.6   < > 93.1 90.7 90.2  PLT 163   < > 167 205 200   < > = values in this interval not displayed.   Lab Results  Component Value Date   TSH 0.59 07/31/2017   Lab Results  Component Value Date   HGBA1C 5.4 06/11/2016   Lab Results  Component Value Date   CHOL 134 07/31/2017   HDL 54 06/11/2016   LDLCALC 61 07/31/2017   TRIG 102 07/31/2017    Significant Diagnostic Results in last 30 days:  No results found.  Assessment/Plan  #1 history of dementia at this point she appears to be doing fairly well with supportive care continues on supplementation including Ensure 4 times a day last albumin actually was 3.8 on lab done last month at this point continue supportive care.  She does continue on Remeron apparently for appetite stimulation she is on Seroquel for bipolar  disorder.  2.  History of hypertension as noted above her blood pressures appear to be pretty stable occasionally has a systolic in the 39Q but recently has been more in the lower 100s apparently she has been asymptomatic- she is not really ambulatory.  3.  History of iron deficiency anemia this appears to be stable on iron hemoglobin 12.9 last month is stable with the April lab.  4.  History of chronic kidney disease but appears stable as well creatinine was 0.68 on lab done in July which is comparable to what it was back in May.  5.  History of hypokalemia this appears to be stable she is on potassium 20 mEq a day potassium was 4.2 on lab done in July will monitor periodically.  6.  History of MRSA bacteremia she was actually rehospitalized back in May-she did do well with vancomycin and Rocephin and was treated for a UTI-.  She continues on cranberry supplement  apparently for prophylaxis  7.  History of COPD she is on as needed albuterol as needed apparently this is been stable for some time.  8.  History of constipation she has orders again for lactulose as needed also has orders for Senokot  Colace MiraLAX apparently this has not really been an issue recently.  9.  History of depression she is on Remeron she is also on Prozac and Prozac was started with hopes of helping with her scratching behaviors apparently has been helping some.    10.  History of glaucoma does not really appear to have acute visual changes she is on topical treatment including timolol and Cosopt at one point apparently she was not getting these because of refusals but apparently she has been a bit more cooperative.   [id number]

## 2018-09-30 DIAGNOSIS — Z20828 Contact with and (suspected) exposure to other viral communicable diseases: Secondary | ICD-10-CM | POA: Diagnosis not present

## 2018-10-07 DIAGNOSIS — Z20828 Contact with and (suspected) exposure to other viral communicable diseases: Secondary | ICD-10-CM | POA: Diagnosis not present

## 2018-10-08 DIAGNOSIS — F329 Major depressive disorder, single episode, unspecified: Secondary | ICD-10-CM | POA: Diagnosis not present

## 2018-10-08 DIAGNOSIS — F419 Anxiety disorder, unspecified: Secondary | ICD-10-CM | POA: Diagnosis not present

## 2018-10-08 DIAGNOSIS — F0391 Unspecified dementia with behavioral disturbance: Secondary | ICD-10-CM | POA: Diagnosis not present

## 2018-10-08 DIAGNOSIS — F39 Unspecified mood [affective] disorder: Secondary | ICD-10-CM | POA: Diagnosis not present

## 2018-10-13 DIAGNOSIS — Z20828 Contact with and (suspected) exposure to other viral communicable diseases: Secondary | ICD-10-CM | POA: Diagnosis not present

## 2018-10-24 ENCOUNTER — Non-Acute Institutional Stay (SKILLED_NURSING_FACILITY): Payer: Medicare Other | Admitting: Internal Medicine

## 2018-10-24 ENCOUNTER — Encounter: Payer: Self-pay | Admitting: Internal Medicine

## 2018-10-24 DIAGNOSIS — J449 Chronic obstructive pulmonary disease, unspecified: Secondary | ICD-10-CM | POA: Diagnosis not present

## 2018-10-24 DIAGNOSIS — I10 Essential (primary) hypertension: Secondary | ICD-10-CM | POA: Diagnosis not present

## 2018-10-24 DIAGNOSIS — J3089 Other allergic rhinitis: Secondary | ICD-10-CM | POA: Diagnosis not present

## 2018-10-24 NOTE — Progress Notes (Signed)
Location:  [facility] Room Number: 304-D Place of Service:  SNF (31)  [name], MD  Patient Care Team: [name], MD as PCP - General (Internal Medicine) [name], MD (Gastroenterology) [name], NP (Obstetrics and Gynecology)  Extended Emergency Contact Information Primary Emergency Contact: [name],[name] Address: [place], [place] Phone: [phone] Mobile Phone: [phone] Relation: Daughter Secondary Emergency Contact: [name] Phone: [phone] Mobile Phone: [phone] Relation: Daughter    Allergies: Amoxicillin, Ampicillin, Depakote [valproic acid], Macrobid [nitrofurantoin macrocrystal], Penicillins, Acetaminophen, Azithromycin, Butalbital, Caffeine, Doxycycline, Escitalopram oxalate, Fioricet [butalbital-apap-caffeine], Latex, Levofloxacin, Levofloxacin, Nsaids, Oxycodone, Restasis [cyclosporine], Sulfa antibiotics, and Biaxin [clarithromycin]  Chief Complaint  Patient presents with  . Medical Management of Chronic Issues    Routine [facility] visit    HPI: Patient is an 85 y.o. female who is being seen for routine issues of hypertension, allergic rhinitis, and COPD.  Past Medical History:  Diagnosis Date  . Anxiety   . Arterial tortuosity (aorta) 12/11/2012  . Asthma   . Chronic kidney disease, stage IV (severe) ([place]) 12/11/2012  . Chronic mental illness   . CKD (chronic kidney disease), stage III ([place]) 01/14/2015  . Closed hip fracture requiring operative repair, right, with routine healing, subsequent encounter 12/19/2015  . Colitis   . COPD (chronic obstructive pulmonary disease) ([place])   . Decreased rectal sphincter tone 11/21/2012  . Depression   . Drug-seeking behavior 02/26/2011   Use of multiple md to obtain benzos/narcotics pt must sign contract to receive controlled meds.   Marland Kitchen  Dysphagia 01/11/2015  . GERD (gastroesophageal reflux disease)   . Glaucoma 06/26/2011  . Glaucoma of both eyes   . Hearing loss, sensorineural 03/04/2014   [facility]; 02/26/14. There is not a medical or surgical treatment that would benefit her type of hearing loss. We discussed amplification in an overview fashion.    Marland Kitchen History of fall   . Insomnia, unspecified   . Macular degeneration 06/26/2011  . Migraines   . Nasal bone fracture 03/30/2014  . Osteoporosis   . Osteoporosis 02/26/2011  . Presence of right artificial hip joint   . Protein-calorie malnutrition, severe ([place]) 12/10/2012  . S/P ORIF (open reduction internal fixation) fracture 01/08/2016  . Schatzki's ring 01/14/2015  . Scoliosis   . Substance abuse ([place])    Benbzos and Narcotics, she does not get any of those medicines from [facility], we have empathetically told her that.  . Vertigo   . Vitamin B deficiency   . Vitamin D deficiency 08/25/2016  . Weight loss     Past Surgical History:  Procedure Laterality Date  . ABDOMINAL HYSTERECTOMY    . CATARACT EXTRACTION    . CHOLECYSTECTOMY    . ESOPHAGOGASTRODUODENOSCOPY (EGD) WITH PROPOFOL N/A 01/13/2015   Procedure: ESOPHAGOGASTRODUODENOSCOPY (EGD) WITH PROPOFOL;  Surgeon: [name], MD;  Location: [facility];  Service: Endoscopy;  Laterality: N/A;  . HIP ARTHROPLASTY Right 12/21/2015   Procedure: ARTHROPLASTY BIPOLAR HIP (HEMIARTHROPLASTY);  Surgeon: [name], MD;  Location: [facility];  Service: Orthopedics;  Laterality: Right;  . PARTIAL HYSTERECTOMY      Allergies as of 10/24/2018      Reactions   Amoxicillin    Per daughter   Ampicillin    Per daughter   Depakote [valproic Acid]    DOES NOT WANT HER  TO TAKE IT==Makes her hair fall out= per daughter who is poa   Macrobid [nitrofurantoin Macrocrystal] Rash   Per MAR   Penicillins    Per daughter   Acetaminophen Other (See Comments)   jaundice   Azithromycin Other (See Comments)   Per MAR    Butalbital    Caffeine    Doxycycline Other (See Comments)   Per MAR   Escitalopram Oxalate Other (See Comments)   Per MAR   Fioricet [butalbital-apap-caffeine] Other (See Comments)   Drunk. Per MAR   Latex Other (See Comments)   Per MAR   Levofloxacin Other (See Comments)   Per MAR   Levofloxacin    Nsaids Other (See Comments)   This is not an allergy. The patient was told by Dr. Rockne Menghini to avoid NSAIDs and stop Vicoprofen in to 2014 to avoid nephrotoxicity. Per MAR.   Oxycodone Other (See Comments)   reaction to synthetic codeine Per MAR   Restasis [cyclosporine] Other (See Comments)   Per MAR   Sulfa Antibiotics Other (See Comments)   Per MAR   Biaxin [clarithromycin] Rash   With burning sensation Per [facility]      Medication List       Accurate as of October 24, 2018 11:59 PM. If you have any questions, ask your nurse or doctor.        STOP taking these medications   neomycin-bacitracin-polymyxin [id number] ointment Stopped by: Inocencio Homes, MD     TAKE these medications   acetaminophen 325 MG tablet Commonly known as: TYLENOL Take 650 mg by mouth every 6 (six) hours as needed for headache.   acetaminophen 500 MG tablet Commonly known as: TYLENOL Give 2 tablets ( 1,000 mg total ) by mouth nightly for pain   albuterol 108 (90 Base) MCG/ACT inhaler Commonly known as: VENTOLIN HFA Inhale 1 puff into the lungs every 6 (six) hours as needed for shortness of breath.   aspirin 81 MG chewable tablet Chew 81 mg by mouth daily.   Biotin 5000 MCG Caps Take 1 capsule by mouth daily.   Claritin 10 MG tablet Generic drug: loratadine Take 10 mg by mouth daily.   Cranberry 450 MG Tabs Take 1 tablet by mouth 2 (two) times daily.   docusate sodium 100 MG capsule Commonly known as: COLACE Take 100 mg by mouth 2 (two) times daily.   dorzolamide-timolol 22.3-6.8 MG/ML ophthalmic solution Commonly known as: COSOPT Place 1 drop into both eyes 2 (two) times daily.    Ensure Take 237 mLs by mouth 4 (four) times daily. D/t weight loss   ferrous sulfate 325 (65 FE) MG tablet Take 325 mg by mouth daily.   fludrocortisone 0.1 MG tablet Commonly known as: FLORINEF Take 0.1 mg by mouth every morning. for orthostatic hypotension   FLUoxetine 10 MG tablet Commonly known as: PROZAC Take 10 mg by mouth daily.   lactulose 10 GM/15ML solution Commonly known as: CHRONULAC Take 15 mLs (10 g total) by mouth daily as needed for severe constipation.   mirtazapine 15 MG tablet Commonly known as: REMERON Take 7.5 mg by mouth at bedtime.   nitroGLYCERIN 0.4 MG SL tablet Commonly known as: NITROSTAT Place 0.4 mg under the tongue every 5 (five) minutes as needed for chest pain. May use 3 times   polyethylene glycol 17 g packet Commonly known as: MIRALAX / GLYCOLAX Take 17 g by mouth daily.   potassium chloride SA 20 MEQ tablet Commonly known as: K-DUR Take 1 tablet (20  mEq total) by mouth daily.   QUEtiapine 100 MG tablet Commonly known as: SEROQUEL Take 100 mg by mouth at bedtime. Take along with a 25 mg tablet to = 125 mg qhs   QUEtiapine 25 MG tablet Commonly known as: SEROQUEL Take 25 mg by mouth at bedtime. Take along with a 100 mg tablet to = 125 mg qhs   senna 8.6 MG tablet Commonly known as: SENOKOT Take 2 tablets by mouth 2 (two) times daily as needed for constipation.   Systane 0.4-0.3 % Soln Generic drug: Polyethyl Glycol-Propyl Glycol Place 2 drops into both eyes every 2 (two) hours as needed (dry eyes).   travoprost (benzalkonium) 0.004 % ophthalmic solution Commonly known as: TRAVATAN Place 1 drop into both eyes at bedtime.   vitamin B-12 1000 MCG tablet Commonly known as: CYANOCOBALAMIN Take 1,500 mcg by mouth daily. 1-1/2 tablets to = 1500 mcg   Vitamin D3 1.25 MG (50000 UT) Caps Take 1 capsule by mouth once a week.       No orders of the defined types were placed in this encounter.   Immunization History   Administered Date(s) Administered  . Influenza Split 11/13/2011  . Influenza-Unspecified 02/13/2008, 11/30/2013, 12/20/2016  . PPD Test 12/15/2015  . Pneumococcal Conjugate-13 10/16/2016  . Pneumococcal-Unspecified 02/13/2008  . Td 02/12/2009    Social History   Tobacco Use  . Smoking status: Former Smoker    Packs/day: 0.50    Types: Cigarettes  . Smokeless tobacco: Never Used  Substance Use Topics  . Alcohol use: No    Review of Systems  DATA OBTAINED: from patient- very limited; nursing-no acute concerns GENERAL:  no fevers, fatigue, appetite changes SKIN: No itching, rash HEENT: No complaint RESPIRATORY: No cough, wheezing, SOB CARDIAC: No chest pain, palpitations, lower extremity edema  GI: No abdominal pain, No N/V/D or constipation, No heartburn or reflux  GU: No dysuria, frequency or urgency, or incontinence  MUSCULOSKELETAL: No unrelieved bone/joint pain NEUROLOGIC: No headache, dizziness  PSYCHIATRIC: No overt anxiety or sadness  Vitals:   10/24/18 1124  BP: 102/76  Pulse: 81  Resp: 18  Temp: 98.9 F (37.2 C)   Body mass index is 15.09 kg/m. Physical Exam  GENERAL APPEARANCE: Alert, minimally conversant, No acute distress  SKIN: No diaphoresis rash HEENT: Unremarkable RESPIRATORY: Breathing is even, unlabored. Lung sounds are clear   CARDIOVASCULAR: Heart RRR no murmurs, rubs or gallops. No peripheral edema  GASTROINTESTINAL: Abdomen is soft, non-tender, not distended w/ normal bowel sounds.  GENITOURINARY: Bladder non tender, not distended  MUSCULOSKELETAL: No abnormal joints or musculature except very thin NEUROLOGIC: Cranial nerves 2-12 grossly intact. Moves all extremities PSYCHIATRIC: Mood and affect with dementia which waxes and wanes in severity, no behavioral issues  Patient Active Problem List   Diagnosis Date Noted  . Allergic rhinitis 06/09/2018  . Pressure injury of skin 06/05/2018  . MRSA bacteremia 06/04/2018  . Chronic  generalized pain 05/16/2018  . Dry eye syndrome of both eyes 02/28/2018  . Abnormal EKG 01/24/2017  . Delusions ([place])   . Paranoia ([place])   . Vitamin D deficiency 08/25/2016  . Dementia with behavioral problem ([place]) 03/25/2016  . S/P ORIF (open reduction internal fixation) fracture 01/08/2016  . Oral thrush 01/08/2016  . HTN (hypertension) 01/08/2016  . Closed hip fracture requiring operative repair, right, with routine healing, subsequent encounter 12/19/2015  . Bipolar disease, chronic ([place]) 12/17/2015  . Decubitus ulcer of hip, stage 3 ([place]) 12/11/2015  . Legally blind 08/16/2015  .  Ankle pain   . Encephalopathy   . Altered mental status 01/26/2015  . Nausea and vomiting 01/17/2015  . Esophageal dysmotilities   . Urinary tract infectious disease   . Weakness 01/14/2015  . Schatzki's ring 01/14/2015  . CKD (chronic kidney disease), stage III ([place]) 01/14/2015  . Esophagus disorder   . Dysphagia 01/11/2015  . Lower urinary tract infectious disease 03/30/2014  . Hearing loss, sensorineural 03/04/2014  . Nasal lesion 03/04/2014  . Constipation 12/12/2012  . Hypokalemia 12/11/2012  . Orthostatic hypotension 12/11/2012  . Arterial tortuosity (aorta) 12/11/2012  . Protein-calorie malnutrition, severe ([place]) 12/10/2012  . Adult failure to thrive 12/09/2012  . Decreased rectal sphincter tone 11/21/2012  . Primary open angle glaucoma of both eyes, severe stage 11/11/2012  . Idiopathic scoliosis 07/08/2012  . Macular atrophy, retinal 06/26/2011  . Vision disturbance 06/26/2011  . Glaucoma 06/26/2011  . Nonexudative senile macular degeneration of retina 06/21/2011  . Macula scar of posterior pole 06/21/2011  . Status post intraocular lens implant 06/21/2011  . Anemia of chronic disease 04/17/2011  . Drug-seeking behavior 02/26/2011  . Osteoporosis 02/26/2011  . Depression   . Anxiety   . COPD (chronic obstructive pulmonary disease) ([place])   . GERD (gastroesophageal reflux disease)    . Migraines   . Insomnia, unspecified     CMP     Component Value Date/Time   NA 142 09/02/2018   NA 140 07/31/2017   K 4.2 09/02/2018   K 5.0 07/31/2017   CL 106 06/08/2018 0317   CO2 21 (L) 06/08/2018 0317   GLUCOSE 89 06/08/2018 0317   BUN 18 09/02/2018   CREATININE 0.7 09/02/2018   CREATININE 0.70 06/08/2018 0317   CREATININE 0.62 07/31/2017   CALCIUM 9.0 06/08/2018 0317   CALCIUM 8.9 07/31/2017   PROT 5.7 (L) 06/05/2018 0320   PROT 5.8 07/31/2017   ALBUMIN 2.5 (L) 06/05/2018 0320   ALBUMIN 3.5 07/31/2017   AST 17 09/02/2018   AST 14 07/31/2017   ALT 9 09/02/2018   ALT 5 07/31/2017   ALKPHOS 98 09/02/2018   ALKPHOS 87 07/31/2017   BILITOT 0.5 06/05/2018 0320   BILITOT 0.3 07/31/2017   GFRNONAA >60 06/08/2018 0317   GFRNONAA 81.51 07/31/2017   GFRNONAA 64 11/09/2013 1135   GFRAA >60 06/08/2018 0317   GFRAA 74 11/09/2013 1135   Recent Labs    06/06/18 0646 06/07/18 0317 06/08/18 0317  06/18/18 06/20/18 06/23/18 09/02/18  NA 145 144 142  --  141  --  143 142  K 2.4* 2.9* 2.4*   < > 3.7  --  3.4 4.2  CL 111 114* 106  --   --   --   --   --   CO2 21* 19* 21*  --   --   --   --   --   GLUCOSE 100* 110* 89  --   --   --   --   --   BUN 15 12 11   --  17  --  11 18  CREATININE 0.67 0.58 0.70   < > 0.4* 0.6 0.5 0.7  CALCIUM 8.5* 8.7* 9.0  --   --   --   --   --   MG  --   --  1.6*  --   --   --   --   --    < > = values in this interval not displayed.   Recent Labs    06/04/18 1121 06/05/18  0320 09/02/18  AST 22 22 17   ALT 16 16 9   ALKPHOS 66 59 98  BILITOT 0.5 0.5  --   PROT 5.9* 5.7*  --   ALBUMIN 2.8* 2.5*  --    Recent Labs    06/04/18 1121  06/06/18 0646 06/07/18 0317 06/08/18 0317 06/23/18 09/02/18  WBC 10.6*   < > 8.6 7.4 7.6 5.3 5.5  NEUTROABS 8.6*  --   --   --   --  3 3  HGB 11.4*   < > 11.3* 11.9* 12.8 12.1 12.9  HCT 36.8   < > 34.9* 36.1 38.8 35* 38  MCV 97.6   < > 93.1 90.7 90.2  --   --   PLT 163   < > 167 205 200 310 224   < > =  values in this interval not displayed.   No results for input(s): CHOL, LDLCALC, TRIG in the last 8760 hours.  Invalid input(s): HCL No results found for: [facility] Lab Results  Component Value Date   TSH 0.59 07/31/2017   Lab Results  Component Value Date   HGBA1C 5.4 06/11/2016   Lab Results  Component Value Date   CHOL 134 07/31/2017   HDL 54 06/11/2016   LDLCALC 61 07/31/2017   TRIG 102 07/31/2017    Significant Diagnostic Results in last 30 days:  No results found.  Assessment and Plan  HTN (hypertension) Controlled on no meds; continue to monitor  Allergic rhinitis Appears controlled; continue Claritin 10 mg daily  COPD (chronic obstructive pulmonary disease) ([place]) Continues without exacerbation; PRN albuterol available      [name], MD

## 2018-10-25 ENCOUNTER — Encounter: Payer: Self-pay | Admitting: Internal Medicine

## 2018-10-25 NOTE — Assessment & Plan Note (Signed)
Appears controlled; continue Claritin 10 mg daily

## 2018-10-25 NOTE — Assessment & Plan Note (Signed)
Continues without exacerbation; PRN albuterol available

## 2018-10-25 NOTE — Assessment & Plan Note (Signed)
Controlled on no meds; continue to monitor 

## 2018-11-11 DIAGNOSIS — J069 Acute upper respiratory infection, unspecified: Secondary | ICD-10-CM | POA: Diagnosis not present

## 2018-11-13 DIAGNOSIS — R1312 Dysphagia, oropharyngeal phase: Secondary | ICD-10-CM | POA: Diagnosis not present

## 2018-11-17 ENCOUNTER — Non-Acute Institutional Stay (SKILLED_NURSING_FACILITY): Payer: Medicare Other | Admitting: Internal Medicine

## 2018-11-17 ENCOUNTER — Encounter: Payer: Self-pay | Admitting: Internal Medicine

## 2018-11-17 DIAGNOSIS — D638 Anemia in other chronic diseases classified elsewhere: Secondary | ICD-10-CM | POA: Diagnosis not present

## 2018-11-17 DIAGNOSIS — G301 Alzheimer's disease with late onset: Secondary | ICD-10-CM | POA: Diagnosis not present

## 2018-11-17 DIAGNOSIS — F0281 Dementia in other diseases classified elsewhere with behavioral disturbance: Secondary | ICD-10-CM | POA: Diagnosis not present

## 2018-11-17 DIAGNOSIS — I951 Orthostatic hypotension: Secondary | ICD-10-CM

## 2018-11-17 DIAGNOSIS — E43 Unspecified severe protein-calorie malnutrition: Secondary | ICD-10-CM | POA: Diagnosis not present

## 2018-11-17 DIAGNOSIS — N183 Chronic kidney disease, stage 3 unspecified: Secondary | ICD-10-CM

## 2018-11-17 DIAGNOSIS — F02818 Dementia in other diseases classified elsewhere, unspecified severity, with other behavioral disturbance: Secondary | ICD-10-CM

## 2018-11-17 NOTE — Progress Notes (Signed)
Location:  [facility] Room Number: 304-D Place of Service:  SNF [phone]) Provider:  Granville Lewis, PA-C  [name], MD  Patient Care Team: [name], MD as PCP - General (Internal Medicine) [name], MD (Gastroenterology) [name], NP (Obstetrics and Gynecology)  Extended Emergency Contact Information Primary Emergency Contact: [name],[name] Address: [place], [place] Phone: [phone] Mobile Phone: [phone] Relation: Daughter Secondary Emergency Contact: [place] Phone: [phone] Mobile Phone: [phone] Relation: Daughter  Code Status:  DNR Goals of care: Advanced Directive information Advanced Directives 11/17/2018  Does Patient Have a Medical Advance Directive? -  Type of Advance Directive Out of facility DNR (pink MOST or yellow form)  Does patient want to make changes to medical advance directive? No - Patient declined  Copy of [place] in Chart? -  Would patient like information on creating a medical advance directive? -  Pre-existing out of facility DNR order (yellow form or pink MOST form) Yellow form placed in chart (order not valid for inpatient use)     Chief Complaint  Patient presents with  . Medical Management of Chronic Issues    Routine [facility] visit  Medical management of dementia-chronic kidney disease- hypotension-allergic rhinitis as well as iron deficiency anemia protein calorie malnutrition depression and COPD and constipation  HPI:  Pt is an 85 y.o. female seen today for medical management of chronic diseases.   As noted above.  Nursing does not report any recent acute issues.  She does have a history of protein calorie malnutrition it appears her weight has stabilized at around 75 pounds albumin was 3.8 on July lab.  She is on Remeron for appetite  stimulation as well as supplements.  She does have a history of hypertension she is on Florinef blood pressures appear to range from 95/56--126/75.  I do not see consistent systolics below 419.  She also has a history of chronic kidney disease which appears to be stable in July lab her creatinine was 0.68.  In regards to anemia she does continue on iron hemoglobin has shown stability 12.9 we will update this.  In regards to her allergic rhinitis she is on Claritin and this has not been an issue I do not believe for some time.  She also has a history of coexistent COPD has PRN albuterol if needed.  She also appears to have a history of significant constipation she is on numerous agents including MiraLAX senna Colace as well as as needed lactulose apparently this has stabilized.  Currently patient is resting in bed comfortably she is pleasant is speaking some she denies any issues but she is a poor historian secondary to dementia   Past Medical History:  Diagnosis Date  . Anxiety   . Arterial tortuosity (aorta) 12/11/2012  . Asthma   . Chronic kidney disease, stage IV (severe) ([place]) 12/11/2012  . Chronic mental illness   . CKD (chronic kidney disease), stage III 01/14/2015  . Closed hip fracture requiring operative repair, right, with routine healing, subsequent encounter 12/19/2015  . Colitis   . COPD (chronic obstructive pulmonary disease) ([place])   . Decreased rectal sphincter tone 11/21/2012  . Depression   . Drug-seeking behavior 02/26/2011   Use of multiple md to obtain benzos/narcotics pt must sign contract to receive controlled meds.   Marland Kitchen Dysphagia  01/11/2015  . GERD (gastroesophageal reflux disease)   . Glaucoma 06/26/2011  . Glaucoma of both eyes   . Hearing loss, sensorineural 03/04/2014   [facility]; 02/26/14. There is not a medical or surgical treatment that would benefit her type of hearing loss. We discussed amplification in an overview fashion.    Marland Kitchen History of fall    . Insomnia, unspecified   . Macular degeneration 06/26/2011  . Migraines   . Nasal bone fracture 03/30/2014  . Osteoporosis   . Osteoporosis 02/26/2011  . Presence of right artificial hip joint   . Protein-calorie malnutrition, severe ([place]) 12/10/2012  . S/P ORIF (open reduction internal fixation) fracture 01/08/2016  . Schatzki's ring 01/14/2015  . Scoliosis   . Substance abuse ([place])    Benbzos and Narcotics, she does not get any of those medicines from [facility], we have empathetically told her that.  . Vertigo   . Vitamin B deficiency   . Vitamin D deficiency 08/25/2016  . Weight loss    Past Surgical History:  Procedure Laterality Date  . ABDOMINAL HYSTERECTOMY    . CATARACT EXTRACTION    . CHOLECYSTECTOMY    . ESOPHAGOGASTRODUODENOSCOPY (EGD) WITH PROPOFOL N/A 01/13/2015   Procedure: ESOPHAGOGASTRODUODENOSCOPY (EGD) WITH PROPOFOL;  Surgeon: [name], MD;  Location: [facility];  Service: Endoscopy;  Laterality: N/A;  . HIP ARTHROPLASTY Right 12/21/2015   Procedure: ARTHROPLASTY BIPOLAR HIP (HEMIARTHROPLASTY);  Surgeon: [name], MD;  Location: [facility];  Service: Orthopedics;  Laterality: Right;  . PARTIAL HYSTERECTOMY      Allergies  Allergen Reactions  . Amoxicillin     Per daughter  . Ampicillin     Per daughter  . Depakote [Valproic Acid]     DOES NOT WANT HER TO TAKE IT==Makes her hair fall out= per daughter who is poa  . Macrobid [Nitrofurantoin Macrocrystal] Rash    Per MAR  . Penicillins     Per daughter  . Acetaminophen Other (See Comments)    jaundice  . Azithromycin Other (See Comments)    Per MAR  . Butalbital   . Caffeine   . Doxycycline Other (See Comments)    Per MAR  . Escitalopram Oxalate Other (See Comments)    Per MAR  . Fioricet [Butalbital-Apap-Caffeine] Other (See Comments)    Drunk. Per MAR  . Latex Other (See Comments)    Per MAR  . Levofloxacin Other (See Comments)    Per MAR  . Levofloxacin   . Nsaids Other (See Comments)     This is not an allergy. The patient was told by Dr. Rockne Menghini to avoid NSAIDs and stop Vicoprofen in to 2014 to avoid nephrotoxicity.  Per MAR.  Marland Kitchen Oxycodone Other (See Comments)    reaction to synthetic codeine Per MAR  . Restasis [Cyclosporine] Other (See Comments)    Per MAR  . Sulfa Antibiotics Other (See Comments)    Per MAR  . Biaxin [Clarithromycin] Rash    With burning sensation Per [facility]    Outpatient Encounter Medications as of 11/17/2018  Medication Sig  . acetaminophen (TYLENOL) 325 MG tablet Take 650 mg by mouth every 6 (six) hours as needed for headache.  Marland Kitchen acetaminophen (TYLENOL) 500 MG tablet Give 2 tablets ( 1,000 mg total ) by mouth nightly for pain  . albuterol (PROVENTIL HFA;VENTOLIN HFA) 108 (90 BASE) MCG/ACT inhaler Inhale 1 puff into the lungs every 6 (six) hours as needed for shortness of breath.   Marland Kitchen aspirin 81 MG  chewable tablet Chew 81 mg by mouth daily.  . Biotin 5000 MCG CAPS Take 1 capsule by mouth daily.   . Cholecalciferol (VITAMIN D3) 1.25 MG (50000 UT) CAPS Take 1 capsule by mouth once a week.  . Cranberry 450 MG TABS Take 1 tablet by mouth 2 (two) times daily.   Marland Kitchen docusate sodium (COLACE) 100 MG capsule Take 100 mg by mouth 2 (two) times daily.  . dorzolamide-timolol (COSOPT) 22.3-6.8 MG/ML ophthalmic solution Place 1 drop into both eyes 2 (two) times daily.  Marland Kitchen ENSURE (ENSURE) Take 237 mLs by mouth 4 (four) times daily. D/t weight loss  . ferrous sulfate 325 (65 FE) MG tablet Take 325 mg by mouth daily.   . fludrocortisone (FLORINEF) 0.1 MG tablet Take 0.1 mg by mouth every morning. for orthostatic hypotension  . FLUoxetine (PROZAC) 10 MG tablet Take 10 mg by mouth daily.  Marland Kitchen lactulose (CHRONULAC) 10 GM/15ML solution Take 15 mLs (10 g total) by mouth daily as needed for severe constipation.  Marland Kitchen loratadine (CLARITIN) 10 MG tablet Take 10 mg by mouth daily.   . mirtazapine (REMERON) 15 MG tablet Take 7.5 mg by mouth at bedtime.  . nitroGLYCERIN (NITROSTAT) 0.4  MG SL tablet Place 0.4 mg under the tongue every 5 (five) minutes as needed for chest pain. May use 3 times  . Polyethyl Glycol-Propyl Glycol (SYSTANE) 0.4-0.3 % SOLN Place 2 drops into both eyes every 2 (two) hours as needed (dry eyes).   . polyethylene glycol (MIRALAX / GLYCOLAX) packet Take 17 g by mouth daily.   . potassium chloride (K-DUR) 20 MEQ tablet Take 1 tablet (20 mEq total) by mouth daily.  . QUEtiapine (SEROQUEL) 100 MG tablet Take 100 mg by mouth at bedtime. Take along with a 25 mg tablet to = 125 mg qhs  . QUEtiapine (SEROQUEL) 25 MG tablet Take 25 mg by mouth at bedtime. Take along with a 100 mg tablet to = 125 mg qhs  . senna (SENOKOT) 8.6 MG tablet Take 2 tablets by mouth 2 (two) times daily as needed for constipation.  . travoprost, benzalkonium, (TRAVATAN) 0.004 % ophthalmic solution Place 1 drop into both eyes at bedtime.  . vitamin B-12 (CYANOCOBALAMIN) 1000 MCG tablet Take 1,500 mcg by mouth daily. 1-1/2 tablets to = 1500 mcg   No facility-administered encounter medications on file as of 11/17/2018.     Review of Systems   This is limited since patient is somewhat of a poor historian nursing does not report any acute concerns.  In general she does not complain of fever or chills.  Skin no diaphoresis or itching complaints.  She does have some history of itching and actually was started on Prozac for this and apparently has helped  Head ears eyes nose mouth and throat not complaining of a sore throat odor difficulty swallowing.  Respiratory does not complain of shortness of breath or cough.  Cardiac no complaints of chest pain or edema.  GI does not complain of abdominal discomfort nausea vomiting diarrhea has a history of constipation but apparently this is stabilized.  Musculoskeletal does not complain of joint pain.  Neurologic has weakness but does not complain of a headache or dizziness.  Psych does have some history of dementia but does not complain of  being anxious or depressed  Immunization History  Administered Date(s) Administered  . Influenza Split 11/13/2011  . Influenza-Unspecified 02/13/2008, 11/30/2013, 12/20/2016  . PPD Test 12/15/2015  . Pneumococcal Conjugate-13 10/16/2016  . Pneumococcal-Unspecified 02/13/2008  .  Td 02/12/2009   Pertinent  Health Maintenance Due  Topic Date Due  . INFLUENZA VACCINE  09/13/2018  . DEXA SCAN  Completed  . PNA vac Low Risk Adult  Completed   Fall Risk  08/27/2017 08/22/2016  Falls in the past year? No Yes  Number falls in past yr: - 2 or more  Injury with Fall? - Yes  Comment - back of head   Functional Status Survey:    Vitals:   11/17/18 0926  BP: 104/73  Pulse: 77  Resp: 18  Temp: (!) 97.1 F (36.2 C)  TempSrc: Oral  SpO2: 96%  Weight: 74 lb 11.2 oz (33.9 kg)  Height: 4\' 11"  (1.499 m)   Body mass index is 15.09 kg/m. Physical Exam In general this is a pleasant elderly female in no distress lying comfortably in bed she is talking a bit more than she did last time I saw her.  Her skin is warm and dry.  Eyes visual acuity appears to be intact sclera and conjunctive are clear.  Oropharynx is clear mucous membranes moist.  Chest is clear to auscultation there is no labored breathing.  Heart is regular rate and rhythm without murmur gallop rub he does not have significant edema.  Abdomen is soft nontender with positive bowel sounds.  Musculoskeletal Limited exam since she is in bed but is able to move all extremities x4 with arthritic changes including her hands.  Neurologic she is alert appears grossly intact could not really appreciate lateralizing findings her speech is clear.  Psych findings consistent with dementia she follows simple verbal commands and can answer fairly simple yes and no questions Labs reviewed: Recent Labs    06/06/18 0646 06/07/18 0317 06/08/18 0317  06/18/18 06/20/18 06/23/18 09/02/18  NA 145 144 142  --  141  --  143 142  K 2.4* 2.9*  2.4*   < > 3.7  --  3.4 4.2  CL 111 114* 106  --   --   --   --   --   CO2 21* 19* 21*  --   --   --   --   --   GLUCOSE 100* 110* 89  --   --   --   --   --   BUN 15 12 11   --  17  --  11 18  CREATININE 0.67 0.58 0.70   < > 0.4* 0.6 0.5 0.7  CALCIUM 8.5* 8.7* 9.0  --   --   --   --   --   MG  --   --  1.6*  --   --   --   --   --    < > = values in this interval not displayed.   Recent Labs    06/04/18 1121 06/05/18 0320 09/02/18  AST 22 22 17   ALT 16 16 9   ALKPHOS 66 59 98  BILITOT 0.5 0.5  --   PROT 5.9* 5.7*  --   ALBUMIN 2.8* 2.5*  --    Recent Labs    06/04/18 1121  06/06/18 0646 06/07/18 0317 06/08/18 0317 06/23/18 09/02/18  WBC 10.6*   < > 8.6 7.4 7.6 5.3 5.5  NEUTROABS 8.6*  --   --   --   --  3 3  HGB 11.4*   < > 11.3* 11.9* 12.8 12.1 12.9  HCT 36.8   < > 34.9* 36.1 38.8 35* 38  MCV 97.6   < >  93.1 90.7 90.2  --   --   PLT 163   < > 167 205 200 310 224   < > = values in this interval not displayed.   Lab Results  Component Value Date   TSH 0.59 07/31/2017   Lab Results  Component Value Date   HGBA1C 5.4 06/11/2016   Lab Results  Component Value Date   CHOL 134 07/31/2017   HDL 54 06/11/2016   LDLCALC 61 07/31/2017   TRIG 102 07/31/2017    Significant Diagnostic Results in last 30 days:  No results found.  Assessment/Plan  #1 history of dementia at this point continue supportive care she is on Seroquel 125 mg a day she is also been started on Prozac for itching which I believe is helping-nursing does not report any recent behaviors.  This is complicated with a history of protein calorie malnutrition she is on supplements and weight appears to be stabilized but this continues to be somewhat of a challenge.  She does continue on Remeron for appetite stimulation  2.  History of hypotension she is on Florinef this apparently has been helping I do not see consistent blood pressures with a systolic below [id number] does not report any issues  symptomatically at this point.  3.  History of chronic kidney disease this has been stable with a creatinine of 0.68 in July will update this for values.  4.  History of anemia with iron deficiency she is on iron hemoglobin has shown stability at 12.9 we will have this updated as well.  5.  History of COPD she is on as needed albuterol I do not believe this is been an issue recently.  6.  History of allergic rhinitis she continues on Claritin .  7.  History of constipation apparently this is been quite an issue in the past but she is now on MiraLAX senna and Colace and actually has lactulose as needed nursing does not report any recent issues.  8.  History of mild hypokalemia she is on supplementation again we will update a lab  [phone]

## 2018-11-18 ENCOUNTER — Encounter: Payer: Self-pay | Admitting: Internal Medicine

## 2018-11-22 DIAGNOSIS — J449 Chronic obstructive pulmonary disease, unspecified: Secondary | ICD-10-CM | POA: Diagnosis not present

## 2018-11-22 DIAGNOSIS — D649 Anemia, unspecified: Secondary | ICD-10-CM | POA: Diagnosis not present

## 2018-11-22 LAB — CBC AND DIFFERENTIAL
HCT: 37 (ref 36–46)
Hemoglobin: 12.3 (ref 12.0–16.0)
Neutrophils Absolute: 6
Platelets: 234 (ref 150–399)
WBC: 8.3

## 2018-11-22 LAB — BASIC METABOLIC PANEL
BUN: 28 — AB (ref 4–21)
CO2: 26 — AB (ref 13–22)
Chloride: 107 (ref 99–108)
Creatinine: 0.6 (ref 0.5–1.1)
Glucose: 77
Potassium: 5.1 (ref 3.4–5.3)
Sodium: 145 (ref 137–147)

## 2018-11-22 LAB — COMPREHENSIVE METABOLIC PANEL
GFR calc Af Amer: 90
GFR calc non Af Amer: 80.34

## 2018-11-22 LAB — CBC: RBC: 4.04 (ref 3.87–5.11)

## 2018-12-06 DIAGNOSIS — D649 Anemia, unspecified: Secondary | ICD-10-CM | POA: Diagnosis not present

## 2018-12-06 DIAGNOSIS — I10 Essential (primary) hypertension: Secondary | ICD-10-CM | POA: Diagnosis not present

## 2018-12-06 LAB — BASIC METABOLIC PANEL
BUN: 23 — AB (ref 4–21)
CO2: 26 — AB (ref 13–22)
Chloride: 104 (ref 99–108)
Creatinine: 0.8 (ref 0.5–1.1)
Glucose: 61
Potassium: 4.9 (ref 3.4–5.3)
Sodium: 144 (ref 137–147)

## 2018-12-06 LAB — COMPREHENSIVE METABOLIC PANEL
Calcium: 9.6 (ref 8.7–10.7)
GFR calc Af Amer: 82.73
GFR calc non Af Amer: 71.38

## 2018-12-23 DIAGNOSIS — R6883 Chills (without fever): Secondary | ICD-10-CM | POA: Diagnosis not present

## 2018-12-23 DIAGNOSIS — J069 Acute upper respiratory infection, unspecified: Secondary | ICD-10-CM | POA: Diagnosis not present

## 2018-12-26 DIAGNOSIS — E559 Vitamin D deficiency, unspecified: Secondary | ICD-10-CM | POA: Diagnosis not present

## 2018-12-26 DIAGNOSIS — E43 Unspecified severe protein-calorie malnutrition: Secondary | ICD-10-CM | POA: Diagnosis not present

## 2018-12-26 LAB — VITAMIN D 25 HYDROXY (VIT D DEFICIENCY, FRACTURES): Vit D, 25-Hydroxy: 60

## 2018-12-29 ENCOUNTER — Encounter: Payer: Self-pay | Admitting: Internal Medicine

## 2018-12-29 ENCOUNTER — Non-Acute Institutional Stay (SKILLED_NURSING_FACILITY): Payer: Medicare Other | Admitting: Internal Medicine

## 2018-12-29 DIAGNOSIS — E43 Unspecified severe protein-calorie malnutrition: Secondary | ICD-10-CM | POA: Diagnosis not present

## 2018-12-29 DIAGNOSIS — G301 Alzheimer's disease with late onset: Secondary | ICD-10-CM | POA: Diagnosis not present

## 2018-12-29 DIAGNOSIS — R1310 Dysphagia, unspecified: Secondary | ICD-10-CM | POA: Diagnosis not present

## 2018-12-29 DIAGNOSIS — F02818 Dementia in other diseases classified elsewhere, unspecified severity, with other behavioral disturbance: Secondary | ICD-10-CM

## 2018-12-29 DIAGNOSIS — F0281 Dementia in other diseases classified elsewhere with behavioral disturbance: Secondary | ICD-10-CM

## 2018-12-29 DIAGNOSIS — Z710 Person encountering health services to consult on behalf of another person: Secondary | ICD-10-CM | POA: Diagnosis not present

## 2018-12-29 NOTE — Progress Notes (Signed)
Location:  [place] Room Number: 304-D Place of Service:  SNF (31)  [name], MD  Patient Care Team: [name], MD as PCP - General (Internal Medicine) [name], MD (Gastroenterology) [name], NP (Obstetrics and Gynecology)  Extended Emergency Contact Information Primary Emergency Contact: [name],[name] Address: [place], [place] Phone: [phone] Mobile Phone: [phone] Relation: Daughter Secondary Emergency Contact: [name] Phone: [phone] Mobile Phone: [phone] Relation: Daughter    Allergies: Amoxicillin, Ampicillin, Depakote [valproic acid], Macrobid [nitrofurantoin macrocrystal], Penicillins, Acetaminophen, Azithromycin, Butalbital, Caffeine, Doxycycline, Escitalopram oxalate, Fioricet [butalbital-apap-caffeine], Latex, Levofloxacin, Levofloxacin, Nsaids, Oxycodone, Restasis [cyclosporine], Sulfa antibiotics, and Biaxin [clarithromycin]  Chief Complaint  Patient presents with  . Advanced Directive    Patient is seen for a Care Plan meeting    HPI: Patient is an 85 y.o. female who is being seen for care plan meeting with her daughter per phone secondary to Covid restrictions.  In attendance is Marjory Lies MDS, social work and dietary and myself.  Patient was seen by me earlier today.  Past Medical History:  Diagnosis Date  . Anxiety   . Arterial tortuosity (aorta) 12/11/2012  . Asthma   . Chronic kidney disease, stage IV (severe) ([place]) 12/11/2012  . Chronic mental illness   . CKD (chronic kidney disease), stage III 01/14/2015  . Closed hip fracture requiring operative repair, right, with routine healing, subsequent encounter 12/19/2015  . Colitis   . COPD (chronic obstructive pulmonary disease) ([place])   . Decreased rectal sphincter tone 11/21/2012  . Depression   . Drug-seeking behavior 02/26/2011   Use of multiple md to obtain benzos/narcotics pt must sign contract to receive controlled meds.   Marland Kitchen Dysphagia 01/11/2015  . GERD (gastroesophageal reflux disease)   . Glaucoma 06/26/2011  . Glaucoma of both eyes   . Hearing loss, sensorineural 03/04/2014   [facility]; 02/26/14. There is not a medical or surgical treatment that would benefit her type of hearing loss. We discussed amplification in an overview fashion.    Marland Kitchen History of fall   . Insomnia, unspecified   . Macular degeneration 06/26/2011  . Migraines   . Nasal bone fracture 03/30/2014  . Osteoporosis   . Osteoporosis 02/26/2011  . Presence of right artificial hip joint   . Protein-calorie malnutrition, severe ([place]) 12/10/2012  . S/P ORIF (open reduction internal fixation) fracture 01/08/2016  . Schatzki's ring 01/14/2015  . Scoliosis   . Substance abuse ([place])    Benbzos and Narcotics, she does not get any of those medicines from [facility], we have empathetically told her that.  . Vertigo   . Vitamin B deficiency   . Vitamin D deficiency 08/25/2016  . Weight loss     Past Surgical History:  Procedure Laterality Date  . ABDOMINAL HYSTERECTOMY    . CATARACT EXTRACTION    . CHOLECYSTECTOMY    . ESOPHAGOGASTRODUODENOSCOPY (EGD) WITH PROPOFOL N/A 01/13/2015   Procedure: ESOPHAGOGASTRODUODENOSCOPY (EGD) WITH PROPOFOL;  Surgeon: [name], MD;  Location: [facility] END[facility];  Service: Endoscopy;  Laterality: N/A;  . HIP ARTHROPLASTY Right 12/21/2015   Procedure: ARTHROPLASTY BIPOLAR HIP (HEMIARTHROPLASTY);  Surgeon: [name], MD;  Location: [place];  Service: Orthopedics;  Laterality: Right;  . PARTIAL HYSTERECTOMY      Allergies as of 12/29/2018      Reactions   Amoxicillin  Per daughter   Ampicillin    Per daughter   Depakote [valproic Acid]    DOES NOT WANT HER TO TAKE IT==Makes her hair fall out= per daughter who is poa   Macrobid [nitrofurantoin Macrocrystal] Rash   Per MAR   Penicillins    Per daughter    Acetaminophen Other (See Comments)   jaundice   Azithromycin Other (See Comments)   Per MAR   Butalbital    Caffeine    Doxycycline Other (See Comments)   Per MAR   Escitalopram Oxalate Other (See Comments)   Per MAR   Fioricet [butalbital-apap-caffeine] Other (See Comments)   Drunk. Per MAR   Latex Other (See Comments)   Per MAR   Levofloxacin Other (See Comments)   Per MAR   Levofloxacin    Nsaids Other (See Comments)   This is not an allergy. The patient was told by Dr. Rockne Menghini to avoid NSAIDs and stop Vicoprofen in to 2014 to avoid nephrotoxicity. Per MAR.   Oxycodone Other (See Comments)   reaction to synthetic codeine Per MAR   Restasis [cyclosporine] Other (See Comments)   Per MAR   Sulfa Antibiotics Other (See Comments)   Per MAR   Biaxin [clarithromycin] Rash   With burning sensation Per [facility]      Medication List       Accurate as of December 29, 2018 11:59 PM. If you have any questions, ask your nurse or doctor.        acetaminophen 325 MG tablet Commonly known as: TYLENOL Take 650 mg by mouth every 6 (six) hours as needed for headache.   acetaminophen 500 MG tablet Commonly known as: TYLENOL Give 2 tablets ( 1,000 mg total ) by mouth nightly for pain   albuterol 108 (90 Base) MCG/ACT inhaler Commonly known as: VENTOLIN HFA Inhale 1 puff into the lungs every 6 (six) hours as needed for shortness of breath.   aspirin 81 MG chewable tablet Chew 81 mg by mouth daily.   Biotin 5000 MCG Caps Take 1 capsule by mouth daily.   Claritin 10 MG tablet Generic drug: loratadine Take 10 mg by mouth daily.   Cranberry 450 MG Tabs Take 1 tablet by mouth 2 (two) times daily.   docusate sodium 100 MG capsule Commonly known as: COLACE Take 100 mg by mouth 2 (two) times daily.   dorzolamide-timolol 22.3-6.8 MG/ML ophthalmic solution Commonly known as: COSOPT Place 1 drop into both eyes 2 (two) times daily.   Ensure Take 237 mLs by mouth 4 (four) times  daily. D/t weight loss   ferrous sulfate 325 (65 FE) MG tablet Take 325 mg by mouth daily.   fludrocortisone 0.1 MG tablet Commonly known as: FLORINEF Take 0.1 mg by mouth every morning. for orthostatic hypotension   FLUoxetine 10 MG tablet Commonly known as: PROZAC Take 10 mg by mouth daily.   lactulose 10 GM/15ML solution Commonly known as: CHRONULAC Take 15 mLs (10 g total) by mouth daily as needed for severe constipation.   mirtazapine 15 MG tablet Commonly known as: REMERON Take 7.5 mg by mouth at bedtime.   nitroGLYCERIN 0.4 MG SL tablet Commonly known as: NITROSTAT Place 0.4 mg under the tongue every 5 (five) minutes as needed for chest pain. May use 3 times   polyethylene glycol 17 g packet Commonly known as: MIRALAX / GLYCOLAX Take 17 g by mouth daily.   potassium chloride SA 20 MEQ tablet Commonly known as: KLOR-CON Take 1 tablet (20 mEq  total) by mouth daily.   QUEtiapine 100 MG tablet Commonly known as: SEROQUEL Take 100 mg by mouth at bedtime. Take along with a 25 mg tablet to = 125 mg qhs   QUEtiapine 25 MG tablet Commonly known as: SEROQUEL Take 25 mg by mouth at bedtime. Take along with a 100 mg tablet to = 125 mg qhs   senna 8.6 MG tablet Commonly known as: SENOKOT Take 2 tablets by mouth 2 (two) times daily as needed for constipation.   Systane 0.4-0.3 % Soln Generic drug: Polyethyl Glycol-Propyl Glycol Place 2 drops into both eyes every 2 (two) hours as needed (dry eyes).   travoprost (benzalkonium) 0.004 % ophthalmic solution Commonly known as: TRAVATAN Place 1 drop into both eyes at bedtime.   vitamin B-12 1000 MCG tablet Commonly known as: CYANOCOBALAMIN Take 1,500 mcg by mouth daily. 1-1/2 tablets to = 1500 mcg   Vitamin D3 1.25 MG (50000 UT) Caps Take 1 capsule by mouth once a week.       No orders of the defined types were placed in this encounter.   Immunization History  Administered Date(s) Administered  . Influenza Split  11/13/2011  . Influenza-Unspecified 02/13/2008, 11/30/2013, 12/20/2016  . PPD Test 12/15/2015  . Pneumococcal Conjugate-13 10/16/2016  . Pneumococcal-Unspecified 02/13/2008  . Td 02/12/2009    Social History   Tobacco Use  . Smoking status: Former Smoker    Packs/day: 0.50    Types: Cigarettes  . Smokeless tobacco: Never Used  Substance Use Topics  . Alcohol use: No    Review of Systems  DATA OBTAINED: from patient-extremely limited; nursing-no concerns except the usual poor appetite GENERAL:  no fevers, fatigue, appetite changes SKIN: No itching, rash HEENT: No complaint RESPIRATORY: No cough, wheezing, SOB CARDIAC: No chest pain, palpitations, lower extremity edema  GI: No abdominal pain, No N/V/D or constipation, No heartburn or reflux  GU: No dysuria, frequency or urgency, or incontinence  MUSCULOSKELETAL: No unrelieved bone/joint pain NEUROLOGIC: No headache, dizziness  PSYCHIATRIC: No overt anxiety or sadness  Vitals:   12/29/18 1541  BP: 104/60  Pulse: 88  Resp: 18  Temp: (!) 97 F (36.1 C)   Body mass index is 15.45 kg/m. Physical Exam  GENERAL APPEARANCE: Alert, minimally conversant, No acute distress  SKIN: No diaphoresis rash HEENT: Unremarkable RESPIRATORY: Breathing is even, unlabored. Lung sounds are clear   CARDIOVASCULAR: Heart RRR no murmurs, rubs or gallops. No peripheral edema  GASTROINTESTINAL: Abdomen is soft, non-tender, not distended w/ normal bowel sounds.  GENITOURINARY: Bladder non tender, not distended  MUSCULOSKELETAL: No abnormal joints or musculature except very thin NEUROLOGIC: Cranial nerves 2-12 grossly intact. Moves all extremities PSYCHIATRIC: Mood and affect with dementia no behavioral issues  Patient Active Problem List   Diagnosis Date Noted  . Allergic rhinitis 06/09/2018  . Pressure injury of skin 06/05/2018  . MRSA bacteremia 06/04/2018  . Chronic generalized pain 05/16/2018  . Dry eye syndrome of both eyes  02/28/2018  . Abnormal EKG 01/24/2017  . Delusions ([place])   . Paranoia ([place])   . Vitamin D deficiency 08/25/2016  . Dementia with behavioral problem ([place]) 03/25/2016  . S/P ORIF (open reduction internal fixation) fracture 01/08/2016  . Oral thrush 01/08/2016  . HTN (hypertension) 01/08/2016  . Closed hip fracture requiring operative repair, right, with routine healing, subsequent encounter 12/19/2015  . Bipolar disease, chronic ([place]) 12/17/2015  . Decubitus ulcer of hip, stage 3 ([place]) 12/11/2015  . Legally blind 08/16/2015  . Ankle pain   .  Encephalopathy   . Altered mental status 01/26/2015  . Nausea and vomiting 01/17/2015  . Esophageal dysmotilities   . Urinary tract infectious disease   . Weakness 01/14/2015  . Schatzki's ring 01/14/2015  . CKD (chronic kidney disease), stage III ([place]) 01/14/2015  . Esophagus disorder   . Dysphagia 01/11/2015  . Lower urinary tract infectious disease 03/30/2014  . Hearing loss, sensorineural 03/04/2014  . Nasal lesion 03/04/2014  . Constipation 12/12/2012  . Hypokalemia 12/11/2012  . Orthostatic hypotension 12/11/2012  . Arterial tortuosity (aorta) 12/11/2012  . Protein-calorie malnutrition, severe ([place]) 12/10/2012  . Adult failure to thrive 12/09/2012  . Decreased rectal sphincter tone 11/21/2012  . Primary open angle glaucoma of both eyes, severe stage 11/11/2012  . Idiopathic scoliosis 07/08/2012  . Macular atrophy, retinal 06/26/2011  . Vision disturbance 06/26/2011  . Glaucoma 06/26/2011  . Nonexudative senile macular degeneration of retina 06/21/2011  . Macula scar of posterior pole 06/21/2011  . Status post intraocular lens implant 06/21/2011  . Anemia of chronic disease 04/17/2011  . Drug-seeking behavior 02/26/2011  . Osteoporosis 02/26/2011  . Depression   . Anxiety   . COPD (chronic obstructive pulmonary disease) ([place])   . GERD (gastroesophageal reflux disease)   . Migraines   . Insomnia, unspecified     CMP      Component Value Date/Time   NA 144 12/06/2018   NA 140 07/31/2017   K 4.9 12/06/2018   K 5.0 07/31/2017   CL 104 12/06/2018   CO2 26 (A) 12/06/2018   GLUCOSE 89 06/08/2018 0317   BUN 23 (A) 12/06/2018   CREATININE 0.8 12/06/2018   CREATININE 0.70 06/08/2018 0317   CREATININE 0.62 07/31/2017   CALCIUM 9.6 12/06/2018   CALCIUM 8.9 07/31/2017   PROT 5.7 (L) 06/05/2018 0320   PROT 5.8 07/31/2017   ALBUMIN 2.5 (L) 06/05/2018 0320   ALBUMIN 3.5 07/31/2017   AST 17 09/02/2018   AST 14 07/31/2017   ALT 9 09/02/2018   ALT 5 07/31/2017   ALKPHOS 98 09/02/2018   ALKPHOS 87 07/31/2017   BILITOT 0.5 06/05/2018 0320   BILITOT 0.3 07/31/2017   GFRNONAA 71.38 12/06/2018   GFRNONAA 81.51 07/31/2017   GFRNONAA 64 11/09/2013 1135   GFRAA 82.73 12/06/2018   GFRAA 74 11/09/2013 1135   Recent Labs    06/06/18 0646 06/07/18 0317 06/08/18 0317  09/02/18 11/22/18 12/06/18  NA 145 144 142   < > 142 145 144  K 2.4* 2.9* 2.4*   < > 4.2 5.1 4.9  CL 111 114* 106  --   --  107 104  CO2 21* 19* 21*  --   --  26* 26*  GLUCOSE 100* 110* 89  --   --   --   --   BUN 15 12 11    < > 18 28* 23*  CREATININE 0.67 0.58 0.70   < > 0.7 0.6 0.8  CALCIUM 8.5* 8.7* 9.0  --   --   --  9.6  MG  --   --  1.6*  --   --   --   --    < > = values in this interval not displayed.   Recent Labs    06/04/18 1121 06/05/18 0320 09/02/18  AST 22 22 17   ALT 16 16 9   ALKPHOS 66 59 98  BILITOT 0.5 0.5  --   PROT 5.9* 5.7*  --   ALBUMIN 2.8* 2.5*  --    Recent Labs  06/06/18 0646 06/07/18 0317 06/08/18 0317 06/23/18 09/02/18 11/22/18  WBC 8.6 7.4 7.6 5.3 5.5 8.3  NEUTROABS  --   --   --  3 3 6   HGB 11.3* 11.9* 12.8 12.1 12.9 12.3  HCT 34.9* 36.1 38.8 35* 38 37  MCV 93.1 90.7 90.2  --   --   --   PLT 167 205 200 310 224 234   No results for input(s): CHOL, LDLCALC, TRIG in the last 8760 hours.  Invalid input(s): HCL No results found for: [facility] Lab Results  Component Value Date   TSH 0.59  07/31/2017   Lab Results  Component Value Date   HGBA1C 5.4 06/11/2016   Lab Results  Component Value Date   CHOL 134 07/31/2017   HDL 54 06/11/2016   LDLCALC 61 07/31/2017   TRIG 102 07/31/2017    Significant Diagnostic Results in last 30 days:  No results found.  Assessment and Plan  Dementia/dysphagia/protein calorie malnutrition/encounter for family conference without patient present-patient's diet is pured with honey thick liquids with spoon only; she continues to drink Ensure but she coughs with it; weight is 76-1/2 pounds which is stable for her as she has vacillated between 70 to 80 pounds since May 2020.  Patient sits up daily in recliner.  Patient refuses care often.  Skin is healthy and intact; patient is incontinent.  Patient daughter is made aware and understood.  All questions answered.      Time spent greater than 35 minutes;> 50% of time with patient was spent reviewing records, labs, tests and studies, counseling and developing plan of care  [name], MD

## 2019-01-02 ENCOUNTER — Non-Acute Institutional Stay (SKILLED_NURSING_FACILITY): Payer: Medicare Other | Admitting: Internal Medicine

## 2019-01-02 ENCOUNTER — Encounter: Payer: Self-pay | Admitting: Internal Medicine

## 2019-01-02 DIAGNOSIS — F319 Bipolar disorder, unspecified: Secondary | ICD-10-CM | POA: Diagnosis not present

## 2019-01-02 DIAGNOSIS — G301 Alzheimer's disease with late onset: Secondary | ICD-10-CM

## 2019-01-02 DIAGNOSIS — K219 Gastro-esophageal reflux disease without esophagitis: Secondary | ICD-10-CM | POA: Diagnosis not present

## 2019-01-02 DIAGNOSIS — F0281 Dementia in other diseases classified elsewhere with behavioral disturbance: Secondary | ICD-10-CM | POA: Diagnosis not present

## 2019-01-02 NOTE — Progress Notes (Signed)
Location:  [facility] Room Number: 304/D Place of Service:  SNF (31)  [name], MD  Patient Care Team: [name], MD as PCP - General (Internal Medicine) [name], MD (Gastroenterology) [name], NP (Obstetrics and Gynecology)  Extended Emergency Contact Information Primary Emergency Contact: [name],[name] Address: [place], [place] Phone: [phone] Mobile Phone: [phone] Relation: Daughter Secondary Emergency Contact: [name] Phone: [phone] Mobile Phone: [phone] Relation: Daughter    Allergies: Amoxicillin, Ampicillin, Depakote [valproic acid], Macrobid [nitrofurantoin macrocrystal], Penicillins, Acetaminophen, Azithromycin, Butalbital, Caffeine, Doxycycline, Escitalopram oxalate, Fioricet [butalbital-apap-caffeine], Latex, Levofloxacin, Levofloxacin, Nsaids, Oxycodone, Restasis [cyclosporine], Sulfa antibiotics, and Biaxin [clarithromycin]  Chief Complaint  Patient presents with  . Medical Management of Chronic Issues    Routine visit of medical management    HPI: Patient is 85 y.o. female who is being seen for routine issues of GERD, dementia, bipolar disease.  Past Medical History:  Diagnosis Date  . Anxiety   . Arterial tortuosity (aorta) 12/11/2012  . Asthma   . Chronic kidney disease, stage IV (severe) ([place]) 12/11/2012  . Chronic mental illness   . CKD (chronic kidney disease), stage III 01/14/2015  . Closed hip fracture requiring operative repair, right, with routine healing, subsequent encounter 12/19/2015  . Colitis   . COPD (chronic obstructive pulmonary disease) ([place])   . Decreased rectal sphincter tone 11/21/2012  . Depression   . Drug-seeking behavior 02/26/2011   Use of multiple md to obtain benzos/narcotics pt must sign contract to receive controlled meds.   Marland Kitchen Dysphagia  01/11/2015  . GERD (gastroesophageal reflux disease)   . Glaucoma 06/26/2011  . Glaucoma of both eyes   . Hearing loss, sensorineural 03/04/2014   [facility] ENT; 02/26/14. There is not a medical or surgical treatment that would benefit her type of hearing loss. We discussed amplification in an overview fashion.    Marland Kitchen History of fall   . Insomnia, unspecified   . Macular degeneration 06/26/2011  . Migraines   . Nasal bone fracture 03/30/2014  . Osteoporosis   . Osteoporosis 02/26/2011  . Presence of right artificial hip joint   . Protein-calorie malnutrition, severe ([place]) 12/10/2012  . S/P ORIF (open reduction internal fixation) fracture 01/08/2016  . Schatzki's ring 01/14/2015  . Scoliosis   . Substance abuse ([place])    Benbzos and Narcotics, she does not get any of those medicines from [facility], we have empathetically told her that.  . Vertigo   . Vitamin B deficiency   . Vitamin D deficiency 08/25/2016  . Weight loss     Past Surgical History:  Procedure Laterality Date  . ABDOMINAL HYSTERECTOMY    . CATARACT EXTRACTION    . CHOLECYSTECTOMY    . ESOPHAGOGASTRODUODENOSCOPY (EGD) WITH PROPOFOL N/A 01/13/2015   Procedure: ESOPHAGOGASTRODUODENOSCOPY (EGD) WITH PROPOFOL;  Surgeon: [name], MD;  Location: [facility] ENDOSCOPY;  Service: Endoscopy;  Laterality: N/A;  . HIP ARTHROPLASTY Right 12/21/2015   Procedure: ARTHROPLASTY BIPOLAR HIP (HEMIARTHROPLASTY);  Surgeon: [name], MD;  Location: [facility];  Service: Orthopedics;  Laterality: Right;  . PARTIAL HYSTERECTOMY      Allergies as of 01/02/2019      Reactions   Amoxicillin    Per daughter   Ampicillin    Per daughter   Depakote [valproic Acid]    DOES NOT WANT HER TO TAKE IT==Makes  her hair fall out= per daughter who is poa   Macrobid [nitrofurantoin Macrocrystal] Rash   Per MAR   Penicillins    Per daughter   Acetaminophen Other (See Comments)   jaundice   Azithromycin Other (See Comments)   Per MAR   Butalbital     Caffeine    Doxycycline Other (See Comments)   Per MAR   Escitalopram Oxalate Other (See Comments)   Per MAR   Fioricet [butalbital-apap-caffeine] Other (See Comments)   Drunk. Per MAR   Latex Other (See Comments)   Per MAR   Levofloxacin Other (See Comments)   Per MAR   Levofloxacin    Nsaids Other (See Comments)   This is not an allergy. The patient was told by Dr. Rockne Menghini to avoid NSAIDs and stop Vicoprofen in to 2014 to avoid nephrotoxicity. Per MAR.   Oxycodone Other (See Comments)   reaction to synthetic codeine Per MAR   Restasis [cyclosporine] Other (See Comments)   Per MAR   Sulfa Antibiotics Other (See Comments)   Per MAR   Biaxin [clarithromycin] Rash   With burning sensation Per [facility]      Medication List       Accurate as of January 02, 2019 11:59 PM. If you have any questions, ask your nurse or doctor.        acetaminophen 325 MG tablet Commonly known as: TYLENOL Take 650 mg by mouth every 6 (six) hours as needed for headache.   acetaminophen 500 MG tablet Commonly known as: TYLENOL Give 2 tablets ( 1,000 mg total ) by mouth nightly for pain   albuterol 108 (90 Base) MCG/ACT inhaler Commonly known as: VENTOLIN HFA Inhale 1 puff into the lungs every 6 (six) hours as needed for shortness of breath.   aspirin 81 MG chewable tablet Chew 81 mg by mouth daily.   Biotin 5000 MCG Caps Take 1 capsule by mouth daily.   bisacodyl 10 MG suppository Commonly known as: DULCOLAX If not relieved by MOM, give 10 mg Bisacodyl suppositiory rectally X 1 dose in 24 hours as needed (Do not use constipation standing orders for residents with renal failure/CFR less than 30. Contact MD for orders) (Physician Order)   Claritin 10 MG tablet Generic drug: loratadine Take 10 mg by mouth daily.   Cranberry 450 MG Tabs Take 1 tablet by mouth 2 (two) times daily.   docusate sodium 100 MG capsule Commonly known as: COLACE Take 100 mg by mouth 2 (two) times daily.    dorzolamide-timolol 22.3-6.8 MG/ML ophthalmic solution Commonly known as: COSOPT Place 1 drop into both eyes 2 (two) times daily.   Ensure Take 237 mLs by mouth 4 (four) times daily. D/t weight loss   ferrous sulfate 325 (65 FE) MG tablet Take 325 mg by mouth daily.   fludrocortisone 0.1 MG tablet Commonly known as: FLORINEF Take 0.1 mg by mouth every morning. for orthostatic hypotension   FLUoxetine 10 MG tablet Commonly known as: PROZAC Take 10 mg by mouth daily.   lactulose 10 GM/15ML solution Commonly known as: CHRONULAC Take 15 mLs (10 g total) by mouth daily as needed for severe constipation.   magnesium hydroxide 400 MG/5ML suspension Commonly known as: MILK OF MAGNESIA If no BM in 3 days, give 30 cc Milk of Magnesium p.o. x 1 dose in 24 hours as needed (Do not use standing constipation orders for residents with renal failure CFR less than 30. Contact MD for orders) (Physician Order)   mirtazapine 15  MG tablet Commonly known as: REMERON Take 7.5 mg by mouth at bedtime.   nitroGLYCERIN 0.4 MG SL tablet Commonly known as: NITROSTAT Place 0.4 mg under the tongue every 5 (five) minutes as needed for chest pain. May use 3 times   polyethylene glycol 17 g packet Commonly known as: MIRALAX / GLYCOLAX Take 17 g by mouth daily.   potassium chloride SA 20 MEQ tablet Commonly known as: KLOR-CON Take 1 tablet (20 mEq total) by mouth daily.   QUEtiapine 100 MG tablet Commonly known as: SEROQUEL Take 100 mg by mouth at bedtime. Take along with a 25 mg tablet to = 125 mg qhs   QUEtiapine 25 MG tablet Commonly known as: SEROQUEL Take 25 mg by mouth at bedtime. Take along with a 100 mg tablet to = 125 mg qhs   RA SALINE ENEMA RE If not relieved by Biscodyl suppository, give disposable Saline Enema rectally X 1 dose/24 hrs as needed (Do not use constipation standing orders for residents with renal failure/CFR less than 30. Contact MD for orders)(Physician Or   senna 8.6 MG  tablet Commonly known as: SENOKOT Take 2 tablets by mouth 2 (two) times daily as needed for constipation.   Systane 0.4-0.3 % Soln Generic drug: Polyethyl Glycol-Propyl Glycol Place 2 drops into both eyes every 2 (two) hours as needed (dry eyes).   travoprost (benzalkonium) 0.004 % ophthalmic solution Commonly known as: TRAVATAN Place 1 drop into both eyes at bedtime.   vitamin B-12 1000 MCG tablet Commonly known as: CYANOCOBALAMIN Take 1,500 mcg by mouth daily. 1-1/2 tablets to = 1500 mcg   Vitamin D3 1.25 MG (50000 UT) Caps Take 1 capsule by mouth once a week.       No orders of the defined types were placed in this encounter.   Immunization History  Administered Date(s) Administered  . Influenza Split 11/13/2011  . Influenza-Unspecified 02/13/2008, 11/30/2013, 12/20/2016  . PPD Test 12/15/2015  . Pneumococcal Conjugate-13 10/16/2016  . Pneumococcal-Unspecified 02/13/2008  . Td 02/12/2009    Social History   Tobacco Use  . Smoking status: Former Smoker    Packs/day: 0.50    Types: Cigarettes  . Smokeless tobacco: Never Used  Substance Use Topics  . Alcohol use: No    Review of Systems  DATA OBTAINED: from nurse GENERAL:  no fevers, fatigue, poor appetite SKIN: No itching, rash HEENT: No complaint RESPIRATORY: No cough, wheezing, SOB CARDIAC: No chest pain, palpitations, lower extremity edema  GI: No abdominal pain, No N/V/D or constipation, No heartburn or reflux  GU: No dysuria, frequency or urgency, or incontinence  MUSCULOSKELETAL: No unrelieved bone/joint pain NEUROLOGIC: No headache, dizziness  PSYCHIATRIC: No overt anxiety or sadness  Vitals:   01/02/19 1030  BP: 104/63  Pulse: 78  Resp: 18  Temp: (!) 96.6 F (35.9 C)  SpO2: 96%   Body mass index is 15.45 kg/m. Physical Exam  GENERAL APPEARANCE: Alert, conversant, No acute distress  SKIN: No diaphoresis rash HEENT: Unremarkable RESPIRATORY: Breathing is even, unlabored. Lung sounds  are clear   CARDIOVASCULAR: Heart RRR no murmurs, rubs or gallops. No peripheral edema  GASTROINTESTINAL: Abdomen is soft, non-tender, not distended w/ normal bowel sounds.  GENITOURINARY: Bladder non tender, not distended  MUSCULOSKELETAL: No abnormal joints or musculature except very thin NEUROLOGIC: Cranial nerves 2-12 grossly intact. Moves all extremities PSYCHIATRIC: Dementia, no behavioral issues  Patient Active Problem List   Diagnosis Date Noted  . Allergic rhinitis 06/09/2018  . Pressure injury of skin  06/05/2018  . MRSA bacteremia 06/04/2018  . Chronic generalized pain 05/16/2018  . Dry eye syndrome of both eyes 02/28/2018  . Abnormal EKG 01/24/2017  . Delusions ([place])   . Paranoia ([place])   . Vitamin D deficiency 08/25/2016  . Dementia with behavioral problem ([place]) 03/25/2016  . S/P ORIF (open reduction internal fixation) fracture 01/08/2016  . Oral thrush 01/08/2016  . HTN (hypertension) 01/08/2016  . Closed hip fracture requiring operative repair, right, with routine healing, subsequent encounter 12/19/2015  . Bipolar disease, chronic ([place]) 12/17/2015  . Decubitus ulcer of hip, stage 3 ([place]) 12/11/2015  . Legally blind 08/16/2015  . Ankle pain   . Encephalopathy   . Altered mental status 01/26/2015  . Nausea and vomiting 01/17/2015  . Esophageal dysmotilities   . Urinary tract infectious disease   . Weakness 01/14/2015  . Schatzki's ring 01/14/2015  . CKD (chronic kidney disease), stage III ([place]) 01/14/2015  . Esophagus disorder   . Dysphagia 01/11/2015  . Lower urinary tract infectious disease 03/30/2014  . Hearing loss, sensorineural 03/04/2014  . Nasal lesion 03/04/2014  . Constipation 12/12/2012  . Hypokalemia 12/11/2012  . Orthostatic hypotension 12/11/2012  . Arterial tortuosity (aorta) 12/11/2012  . Protein-calorie malnutrition, severe ([place]) 12/10/2012  . Adult failure to thrive 12/09/2012  . Decreased rectal sphincter tone 11/21/2012  . Primary open  angle glaucoma of both eyes, severe stage 11/11/2012  . Idiopathic scoliosis 07/08/2012  . Macular atrophy, retinal 06/26/2011  . Vision disturbance 06/26/2011  . Glaucoma 06/26/2011  . Nonexudative senile macular degeneration of retina 06/21/2011  . Macula scar of posterior pole 06/21/2011  . Status post intraocular lens implant 06/21/2011  . Anemia of chronic disease 04/17/2011  . Drug-seeking behavior 02/26/2011  . Osteoporosis 02/26/2011  . Depression   . Anxiety   . COPD (chronic obstructive pulmonary disease) ([place])   . GERD (gastroesophageal reflux disease)   . Migraines   . Insomnia, unspecified     CMP     Component Value Date/Time   NA 144 12/06/2018   NA 140 07/31/2017   K 4.9 12/06/2018   K 5.0 07/31/2017   CL 104 12/06/2018   CO2 26 (A) 12/06/2018   GLUCOSE 89 06/08/2018 0317   BUN 23 (A) 12/06/2018   CREATININE 0.8 12/06/2018   CREATININE 0.70 06/08/2018 0317   CREATININE 0.62 07/31/2017   CALCIUM 9.6 12/06/2018   CALCIUM 8.9 07/31/2017   PROT 5.7 (L) 06/05/2018 0320   PROT 5.8 07/31/2017   ALBUMIN 2.5 (L) 06/05/2018 0320   ALBUMIN 3.5 07/31/2017   AST 17 09/02/2018   AST 14 07/31/2017   ALT 9 09/02/2018   ALT 5 07/31/2017   ALKPHOS 98 09/02/2018   ALKPHOS 87 07/31/2017   BILITOT 0.5 06/05/2018 0320   BILITOT 0.3 07/31/2017   GFRNONAA 71.38 12/06/2018   GFRNONAA 81.51 07/31/2017   GFRNONAA 64 11/09/2013 1135   GFRAA 82.73 12/06/2018   GFRAA 74 11/09/2013 1135   Recent Labs    06/06/18 0646 06/07/18 0317 06/08/18 0317  09/02/18 11/22/18 12/06/18  NA 145 144 142   < > 142 145 144  K 2.4* 2.9* 2.4*   < > 4.2 5.1 4.9  CL 111 114* 106  --   --  107 104  CO2 21* 19* 21*  --   --  26* 26*  GLUCOSE 100* 110* 89  --   --   --   --   BUN 15 12 11    < > 18  28* 23*  CREATININE 0.67 0.58 0.70   < > 0.7 0.6 0.8  CALCIUM 8.5* 8.7* 9.0  --   --   --  9.6  MG  --   --  1.6*  --   --   --   --    < > = values in this interval not displayed.   Recent  Labs    06/04/18 1121 06/05/18 0320 09/02/18  AST 22 22 17   ALT 16 16 9   ALKPHOS 66 59 98  BILITOT 0.5 0.5  --   PROT 5.9* 5.7*  --   ALBUMIN 2.8* 2.5*  --    Recent Labs    06/06/18 0646 06/07/18 0317 06/08/18 0317 06/23/18 09/02/18 11/22/18  WBC 8.6 7.4 7.6 5.3 5.5 8.3  NEUTROABS  --   --   --  3 3 6   HGB 11.3* 11.9* 12.8 12.1 12.9 12.3  HCT 34.9* 36.1 38.8 35* 38 37  MCV 93.1 90.7 90.2  --   --   --   PLT 167 205 200 310 224 234   No results for input(s): CHOL, LDLCALC, TRIG in the last 8760 hours.  Invalid input(s): HCL No results found for: [facility] Lab Results  Component Value Date   TSH 0.59 07/31/2017   Lab Results  Component Value Date   HGBA1C 5.4 06/11/2016   Lab Results  Component Value Date   CHOL 134 07/31/2017   HDL 54 06/11/2016   LDLCALC 61 07/31/2017   TRIG 102 07/31/2017    Significant Diagnostic Results in last 30 days:  No results found.  Assessment and Plan  GERD (gastroesophageal reflux disease) Controlled with MOM 30 cc daily; continue current regimen  Dementia with behavioral problem ([place]) Slowly progressing with FTT already occurring; continue supportive care  Bipolar disease, chronic ([place]) Controlled; continue Seroquel 125 mg nightly     [name], MD

## 2019-01-03 ENCOUNTER — Encounter: Payer: Self-pay | Admitting: Internal Medicine

## 2019-01-04 ENCOUNTER — Encounter: Payer: Self-pay | Admitting: Internal Medicine

## 2019-01-04 NOTE — Assessment & Plan Note (Signed)
Controlled with MOM 30 cc daily; continue current regimen 

## 2019-01-04 NOTE — Assessment & Plan Note (Signed)
Slowly progressing with FTT already occurring; continue supportive care

## 2019-01-04 NOTE — Assessment & Plan Note (Signed)
Controlled; continue Seroquel 125 mg nightly

## 2019-01-05 DIAGNOSIS — J069 Acute upper respiratory infection, unspecified: Secondary | ICD-10-CM | POA: Diagnosis not present

## 2019-01-19 ENCOUNTER — Non-Acute Institutional Stay (SKILLED_NURSING_FACILITY): Payer: Medicare Other | Admitting: Internal Medicine

## 2019-01-19 ENCOUNTER — Encounter: Payer: Self-pay | Admitting: Internal Medicine

## 2019-01-19 DIAGNOSIS — Z789 Other specified health status: Secondary | ICD-10-CM

## 2019-01-19 NOTE — Progress Notes (Signed)
Location:  [place] Room Number: 304/D Place of Service:  SNF (31)  [name], MD  Patient Care Team: [name], MD as PCP - General (Internal Medicine) [name], MD (Gastroenterology) [name], NP (Obstetrics and Gynecology)  Extended Emergency Contact Information Primary Emergency Contact: [name],[name] Address: [place], [place] Phone: [phone] Mobile Phone: [phone] Relation: Daughter Secondary Emergency Contact: [name] Phone: [phone] Mobile Phone: [phone] Relation: Daughter    Allergies: Amoxicillin, Ampicillin, Depakote [valproic acid], Macrobid [nitrofurantoin macrocrystal], Penicillins, Acetaminophen, Azithromycin, Butalbital, Caffeine, Doxycycline, Escitalopram oxalate, Fioricet [butalbital-apap-caffeine], Latex, Levofloxacin, Levofloxacin, Nsaids, Oxycodone, Restasis [cyclosporine], Sulfa antibiotics, and Biaxin [clarithromycin]  Chief Complaint  Patient presents with  . Acute Visit    COVID Prophylaxis     HPI: Patient is 85 y.o. female who is being seen for viral prophylaxis.  Everyone in the facility is being put on vitamin D zinc and vitamin C.  Past Medical History:  Diagnosis Date  . Anxiety   . Arterial tortuosity (aorta) 12/11/2012  . Asthma   . Chronic kidney disease, stage IV (severe) ([place]) 12/11/2012  . Chronic mental illness   . CKD (chronic kidney disease), stage III 01/14/2015  . Closed hip fracture requiring operative repair, right, with routine healing, subsequent encounter 12/19/2015  . Colitis   . COPD (chronic obstructive pulmonary disease) ([place])   . Decreased rectal sphincter tone 11/21/2012  . Depression   . Drug-seeking behavior 02/26/2011   Use of multiple md to obtain benzos/narcotics pt must sign contract to receive controlled meds.   Marland Kitchen Dysphagia 01/11/2015   . GERD (gastroesophageal reflux disease)   . Glaucoma 06/26/2011  . Glaucoma of both eyes   . Hearing loss, sensorineural 03/04/2014   [facility] ENT; 02/26/14. There is not a medical or surgical treatment that would benefit her type of hearing loss. We discussed amplification in an overview fashion.    Marland Kitchen History of fall   . Insomnia, unspecified   . Macular degeneration 06/26/2011  . Migraines   . Nasal bone fracture 03/30/2014  . Osteoporosis   . Osteoporosis 02/26/2011  . Presence of right artificial hip joint   . Protein-calorie malnutrition, severe ([place]) 12/10/2012  . S/P ORIF (open reduction internal fixation) fracture 01/08/2016  . Schatzki's ring 01/14/2015  . Scoliosis   . Substance abuse ([place])    Benbzos and Narcotics, she does not get any of those medicines from [facility], we have empathetically told her that.  . Vertigo   . Vitamin B deficiency   . Vitamin D deficiency 08/25/2016  . Weight loss     Past Surgical History:  Procedure Laterality Date  . ABDOMINAL HYSTERECTOMY    . CATARACT EXTRACTION    . CHOLECYSTECTOMY    . ESOPHAGOGASTRODUODENOSCOPY (EGD) WITH PROPOFOL N/A 01/13/2015   Procedure: ESOPHAGOGASTRODUODENOSCOPY (EGD) WITH PROPOFOL;  Surgeon: [name], MD;  Location: [facility];  Service: Endoscopy;  Laterality: N/A;  . HIP ARTHROPLASTY Right 12/21/2015   Procedure: ARTHROPLASTY BIPOLAR HIP (HEMIARTHROPLASTY);  Surgeon: [name], MD;  Location: [place];  Service: Orthopedics;  Laterality: Right;  . PARTIAL HYSTERECTOMY      Allergies as of 01/19/2019      Reactions   Amoxicillin    Per daughter   Ampicillin    Per daughter   Depakote [valproic Acid]    DOES NOT  WANT HER TO TAKE IT==Makes her hair fall out= per daughter who is poa   Macrobid [nitrofurantoin Macrocrystal] Rash   Per MAR   Penicillins    Per daughter   Acetaminophen Other (See Comments)   jaundice   Azithromycin Other (See Comments)   Per MAR   Butalbital    Caffeine     Doxycycline Other (See Comments)   Per MAR   Escitalopram Oxalate Other (See Comments)   Per MAR   Fioricet [butalbital-apap-caffeine] Other (See Comments)   Drunk. Per MAR   Latex Other (See Comments)   Per MAR   Levofloxacin Other (See Comments)   Per MAR   Levofloxacin    Nsaids Other (See Comments)   This is not an allergy. The patient was told by Dr. Rockne Menghini to avoid NSAIDs and stop Vicoprofen in to 2014 to avoid nephrotoxicity. Per MAR.   Oxycodone Other (See Comments)   reaction to synthetic codeine Per MAR   Restasis [cyclosporine] Other (See Comments)   Per MAR   Sulfa Antibiotics Other (See Comments)   Per MAR   Biaxin [clarithromycin] Rash   With burning sensation Per [facility]      Medication List       Accurate as of January 19, 2019 11:59 PM. If you have any questions, ask your nurse or doctor.        acetaminophen 325 MG tablet Commonly known as: TYLENOL Take 650 mg by mouth every 6 (six) hours as needed for headache.   acetaminophen 500 MG tablet Commonly known as: TYLENOL Give 2 tablets ( 1,000 mg total ) by mouth nightly for pain   albuterol 108 (90 Base) MCG/ACT inhaler Commonly known as: VENTOLIN HFA Inhale 1 puff into the lungs every 6 (six) hours as needed for shortness of breath.   aspirin 81 MG chewable tablet Chew 81 mg by mouth daily.   Biotin 5000 MCG Caps Take 1 capsule by mouth daily.   bisacodyl 10 MG suppository Commonly known as: DULCOLAX If not relieved by MOM, give 10 mg Bisacodyl suppositiory rectally X 1 dose in 24 hours as needed (Do not use constipation standing orders for residents with renal failure/CFR less than 30. Contact MD for orders) (Physician Order)   Claritin 10 MG tablet Generic drug: loratadine Take 10 mg by mouth daily.   Cranberry 450 MG Tabs Take 1 tablet by mouth 2 (two) times daily.   docusate sodium 100 MG capsule Commonly known as: COLACE Take 100 mg by mouth 2 (two) times daily.   dorzolamide-timolol  22.3-6.8 MG/ML ophthalmic solution Commonly known as: COSOPT Place 1 drop into both eyes 2 (two) times daily.   Ensure Take 237 mLs by mouth 4 (four) times daily. D/t weight loss   ferrous sulfate 325 (65 FE) MG tablet Take 325 mg by mouth daily.   fludrocortisone 0.1 MG tablet Commonly known as: FLORINEF Take 0.1 mg by mouth every morning. for orthostatic hypotension   FLUoxetine 10 MG tablet Commonly known as: PROZAC Take 10 mg by mouth daily.   lactulose 10 GM/15ML solution Commonly known as: CHRONULAC Take 15 mLs (10 g total) by mouth daily as needed for severe constipation.   magnesium hydroxide 400 MG/5ML suspension Commonly known as: MILK OF MAGNESIA If no BM in 3 days, give 30 cc Milk of Magnesium p.o. x 1 dose in 24 hours as needed (Do not use standing constipation orders for residents with renal failure CFR less than 30. Contact MD for orders) (Physician  Order)   mirtazapine 15 MG tablet Commonly known as: REMERON Take 7.5 mg by mouth at bedtime.   nitroGLYCERIN 0.4 MG SL tablet Commonly known as: NITROSTAT Place 0.4 mg under the tongue every 5 (five) minutes as needed for chest pain. May use 3 times   polyethylene glycol 17 g packet Commonly known as: MIRALAX / GLYCOLAX Take 17 g by mouth daily.   potassium chloride SA 20 MEQ tablet Commonly known as: KLOR-CON Take 1 tablet (20 mEq total) by mouth daily.   QUEtiapine 100 MG tablet Commonly known as: SEROQUEL Take 100 mg by mouth at bedtime. Take along with a 25 mg tablet to = 125 mg qhs   QUEtiapine 25 MG tablet Commonly known as: SEROQUEL Take 25 mg by mouth at bedtime. Take along with a 100 mg tablet to = 125 mg qhs   RA SALINE ENEMA RE If not relieved by Biscodyl suppository, give disposable Saline Enema rectally X 1 dose/24 hrs as needed (Do not use constipation standing orders for residents with renal failure/CFR less than 30. Contact MD for orders)(Physician Or   senna 8.6 MG tablet Commonly  known as: SENOKOT Take 2 tablets by mouth 2 (two) times daily as needed for constipation.   Systane 0.4-0.3 % Soln Generic drug: Polyethyl Glycol-Propyl Glycol Place 2 drops into both eyes every 2 (two) hours as needed (dry eyes).   travoprost (benzalkonium) 0.004 % ophthalmic solution Commonly known as: TRAVATAN Place 1 drop into both eyes at bedtime.   vitamin B-12 1000 MCG tablet Commonly known as: CYANOCOBALAMIN Take 1,500 mcg by mouth daily. 1-1/2 tablets to = 1500 mcg   Vitamin D3 1.25 MG (50000 UT) Caps Take 1 capsule by mouth once a week.       No orders of the defined types were placed in this encounter.   Immunization History  Administered Date(s) Administered  . Influenza Split 11/13/2011  . Influenza-Unspecified 02/13/2008, 11/30/2013, 12/20/2016  . PPD Test 12/15/2015  . Pneumococcal Conjugate-13 10/16/2016  . Pneumococcal-Unspecified 02/13/2008  . Td 02/12/2009    Social History   Tobacco Use  . Smoking status: Former Smoker    Packs/day: 0.50    Types: Cigarettes  . Smokeless tobacco: Never Used  Substance Use Topics  . Alcohol use: No    Review of Systems    minimal from patient; nursing-no new concerns     Vitals:   01/19/19 1107  BP: 117/77  Pulse: 68  Resp: 18  Temp: 98 F (36.7 C)  SpO2: 96%   Body mass index is 15.41 kg/m. Physical Exam  GENERAL APPEARANCE: Alert, minimally conversant, No acute distress  SKIN: No diaphoresis rash HEENT: Unremarkable RESPIRATORY: Breathing is even, unlabored. Lung sounds are clear   CARDIOVASCULAR: Heart RRR no murmurs, rubs or gallops. No peripheral edema  GASTROINTESTINAL: Abdomen is soft, non-tender, not distended w/ normal bowel sounds.  GENITOURINARY: Bladder non tender, not distended  MUSCULOSKELETAL: No abnormal joints or musculature except very thin NEUROLOGIC: Cranial nerves 2-12 grossly intact. Moves all extremities PSYCHIATRIC: Mood and affect with dementia, no behavioral issues   Patient Active Problem List   Diagnosis Date Noted  . Allergic rhinitis 06/09/2018  . Pressure injury of skin 06/05/2018  . MRSA bacteremia 06/04/2018  . Chronic generalized pain 05/16/2018  . Dry eye syndrome of both eyes 02/28/2018  . Abnormal EKG 01/24/2017  . Delusions ([place])   . Paranoia ([place])   . Vitamin D deficiency 08/25/2016  . Dementia with behavioral problem ([place]) 03/25/2016  .  S/P ORIF (open reduction internal fixation) fracture 01/08/2016  . Oral thrush 01/08/2016  . HTN (hypertension) 01/08/2016  . Closed hip fracture requiring operative repair, right, with routine healing, subsequent encounter 12/19/2015  . Bipolar disease, chronic ([place]) 12/17/2015  . Decubitus ulcer of hip, stage 3 ([place]) 12/11/2015  . Legally blind 08/16/2015  . Ankle pain   . Encephalopathy   . Altered mental status 01/26/2015  . Nausea and vomiting 01/17/2015  . Esophageal dysmotilities   . Urinary tract infectious disease   . Weakness 01/14/2015  . Schatzki's ring 01/14/2015  . CKD (chronic kidney disease), stage III ([place]) 01/14/2015  . Esophagus disorder   . Dysphagia 01/11/2015  . Lower urinary tract infectious disease 03/30/2014  . Hearing loss, sensorineural 03/04/2014  . Nasal lesion 03/04/2014  . Constipation 12/12/2012  . Hypokalemia 12/11/2012  . Orthostatic hypotension 12/11/2012  . Arterial tortuosity (aorta) 12/11/2012  . Protein-calorie malnutrition, severe ([place]) 12/10/2012  . Adult failure to thrive 12/09/2012  . Decreased rectal sphincter tone 11/21/2012  . Primary open angle glaucoma of both eyes, severe stage 11/11/2012  . Idiopathic scoliosis 07/08/2012  . Macular atrophy, retinal 06/26/2011  . Vision disturbance 06/26/2011  . Glaucoma 06/26/2011  . Nonexudative senile macular degeneration of retina 06/21/2011  . Macula scar of posterior pole 06/21/2011  . Status post intraocular lens implant 06/21/2011  . Anemia of chronic disease 04/17/2011  . Drug-seeking  behavior 02/26/2011  . Osteoporosis 02/26/2011  . Depression   . Anxiety   . COPD (chronic obstructive pulmonary disease) ([place])   . GERD (gastroesophageal reflux disease)   . Migraines   . Insomnia, unspecified     CMP     Component Value Date/Time   NA 144 12/06/2018 0000   NA 140 07/31/2017 0000   K 4.9 12/06/2018 0000   K 5.0 07/31/2017 0000   CL 104 12/06/2018 0000   CO2 26 (A) 12/06/2018 0000   GLUCOSE 89 06/08/2018 0317   BUN 23 (A) 12/06/2018 0000   CREATININE 0.8 12/06/2018 0000   CREATININE 0.70 06/08/2018 0317   CREATININE 0.62 07/31/2017 0000   CALCIUM 9.6 12/06/2018 0000   CALCIUM 8.9 07/31/2017 0000   PROT 5.7 (L) 06/05/2018 0320   PROT 5.8 07/31/2017 0000   ALBUMIN 2.5 (L) 06/05/2018 0320   ALBUMIN 3.5 07/31/2017 0000   AST 17 09/02/2018 0000   AST 14 07/31/2017 0000   ALT 9 09/02/2018 0000   ALT 5 07/31/2017 0000   ALKPHOS 98 09/02/2018 0000   ALKPHOS 87 07/31/2017 0000   BILITOT 0.5 06/05/2018 0320   BILITOT 0.3 07/31/2017 0000   GFRNONAA 71.38 12/06/2018 0000   GFRNONAA 81.51 07/31/2017 0000   GFRNONAA 64 11/09/2013 1135   GFRAA 82.73 12/06/2018 0000   GFRAA 74 11/09/2013 1135   Recent Labs    06/06/18 0646 06/07/18 0317 06/08/18 0317 06/08/18 1052 09/02/18 0000 11/22/18 0000 12/06/18 0000  NA 145 144 142   < > 142 145 144  K 2.4* 2.9* 2.4*  --  4.2 5.1 4.9  CL 111 114* 106  --   --  107 104  CO2 21* 19* 21*  --   --  26* 26*  GLUCOSE 100* 110* 89  --   --   --   --   BUN 15 12 11    < > 18 28* 23*  CREATININE 0.67 0.58 0.70   < > 0.7 0.6 0.8  CALCIUM 8.5* 8.7* 9.0  --   --   --  9.6  MG  --   --  1.6*  --   --   --   --    < > = values in this interval not displayed.   Recent Labs    06/04/18 1121 06/05/18 0320 09/02/18 0000  AST 22 22 17   ALT 16 16 9   ALKPHOS 66 59 98  BILITOT 0.5 0.5  --   PROT 5.9* 5.7*  --   ALBUMIN 2.8* 2.5*  --    Recent Labs    06/06/18 0646 06/07/18 0317 06/08/18 0317 06/23/18 0000 09/02/18  0000 11/22/18 0000  WBC 8.6 7.4 7.6 5.3 5.5 8.3  NEUTROABS  --   --   --  3 3 6   HGB 11.3* 11.9* 12.8 12.1 12.9 12.3  HCT 34.9* 36.1 38.8 35* 38 37  MCV 93.1 90.7 90.2  --   --   --   PLT 167 205 200 310 224 234   No results for input(s): CHOL, LDLCALC, TRIG in the last 8760 hours.  Invalid input(s): HCL No results found for: [facility] Lab Results  Component Value Date   TSH 0.59 07/31/2017   Lab Results  Component Value Date   HGBA1C 5.4 06/11/2016   Lab Results  Component Value Date   CHOL 134 07/31/2017   HDL 54 06/11/2016   LDLCALC 61 07/31/2017   TRIG 102 07/31/2017    Significant Diagnostic Results in last 30 days:  No results found.  Assessment and Plan  Does not have immunity against COVID-19-patient is already on vitamin D 50,000 weekly which will continue.  Patient will be put on vitamin C 500 mg daily x90 days and zinc 220 mg every other day x90 days.  Patient will get a vitamin D level today and again in 90 days.    [name], MD

## 2019-01-19 NOTE — Progress Notes (Deleted)
Location:  [facility] Room Number: 304/D Place of Service:  SNF (31)  [name], MD  Patient Care Team: [name], MD as PCP - General (Internal Medicine) [name], MD (Gastroenterology) [name], NP (Obstetrics and Gynecology)  Extended Emergency Contact Information Primary Emergency Contact: [name],[name] Address: [place], [place] Phone: [phone] Mobile Phone: [phone] Relation: Daughter Secondary Emergency Contact: [name] Phone: [phone] Mobile Phone: [phone] Relation: Daughter    Allergies: Amoxicillin, Ampicillin, Depakote [valproic acid], Macrobid [nitrofurantoin macrocrystal], Penicillins, Acetaminophen, Azithromycin, Butalbital, Caffeine, Doxycycline, Escitalopram oxalate, Fioricet [butalbital-apap-caffeine], Latex, Levofloxacin, Levofloxacin, Nsaids, Oxycodone, Restasis [cyclosporine], Sulfa antibiotics, and Biaxin [clarithromycin]  Chief Complaint  Patient presents with  . Medical Management of Chronic Issues    Routine visit of medical manangement     HPI: Patient is 85 y.o. female who   Past Medical History:  Diagnosis Date  . Anxiety   . Arterial tortuosity (aorta) 12/11/2012  . Asthma   . Chronic kidney disease, stage IV (severe) ([place]) 12/11/2012  . Chronic mental illness   . CKD (chronic kidney disease), stage III 01/14/2015  . Closed hip fracture requiring operative repair, right, with routine healing, subsequent encounter 12/19/2015  . Colitis   . COPD (chronic obstructive pulmonary disease) ([place])   . Decreased rectal sphincter tone 11/21/2012  . Depression   . Drug-seeking behavior 02/26/2011   Use of multiple md to obtain benzos/narcotics pt must sign contract to receive controlled meds.   Marland Kitchen Dysphagia 01/11/2015  . GERD (gastroesophageal reflux disease)   . Glaucoma  06/26/2011  . Glaucoma of both eyes   . Hearing loss, sensorineural 03/04/2014   [facility]; 02/26/14. There is not a medical or surgical treatment that would benefit her type of hearing loss. We discussed amplification in an overview fashion.    Marland Kitchen History of fall   . Insomnia, unspecified   . Macular degeneration 06/26/2011  . Migraines   . Nasal bone fracture 03/30/2014  . Osteoporosis   . Osteoporosis 02/26/2011  . Presence of right artificial hip joint   . Protein-calorie malnutrition, severe ([place]) 12/10/2012  . S/P ORIF (open reduction internal fixation) fracture 01/08/2016  . Schatzki's ring 01/14/2015  . Scoliosis   . Substance abuse ([place])    Benbzos and Narcotics, she does not get any of those medicines from [facility], we have empathetically told her that.  . Vertigo   . Vitamin B deficiency   . Vitamin D deficiency 08/25/2016  . Weight loss     Past Surgical History:  Procedure Laterality Date  . ABDOMINAL HYSTERECTOMY    . CATARACT EXTRACTION    . CHOLECYSTECTOMY    . ESOPHAGOGASTRODUODENOSCOPY (EGD) WITH PROPOFOL N/A 01/13/2015   Procedure: ESOPHAGOGASTRODUODENOSCOPY (EGD) WITH PROPOFOL;  Surgeon: [name], MD;  Location: [facility];  Service: Endoscopy;  Laterality: N/A;  . HIP ARTHROPLASTY Right 12/21/2015   Procedure: ARTHROPLASTY BIPOLAR HIP (HEMIARTHROPLASTY);  Surgeon: [name], MD;  Location: [place];  Service: Orthopedics;  Laterality: Right;  . PARTIAL HYSTERECTOMY      Allergies as of 01/19/2019      Reactions   Amoxicillin    Per daughter   Ampicillin    Per daughter   Depakote [valproic Acid]    DOES NOT WANT HER TO TAKE IT==Makes her hair fall out= per daughter who is poa  Macrobid [nitrofurantoin Macrocrystal] Rash   Per MAR   Penicillins    Per daughter   Acetaminophen Other (See Comments)   jaundice   Azithromycin Other (See Comments)   Per MAR   Butalbital    Caffeine    Doxycycline Other (See Comments)   Per MAR    Escitalopram Oxalate Other (See Comments)   Per MAR   Fioricet [butalbital-apap-caffeine] Other (See Comments)   Drunk. Per MAR   Latex Other (See Comments)   Per MAR   Levofloxacin Other (See Comments)   Per MAR   Levofloxacin    Nsaids Other (See Comments)   This is not an allergy. The patient was told by Dr. Rockne Menghini to avoid NSAIDs and stop Vicoprofen in to 2014 to avoid nephrotoxicity. Per MAR.   Oxycodone Other (See Comments)   reaction to synthetic codeine Per MAR   Restasis [cyclosporine] Other (See Comments)   Per MAR   Sulfa Antibiotics Other (See Comments)   Per MAR   Biaxin [clarithromycin] Rash   With burning sensation Per [facility]      Medication List       Accurate as of January 19, 2019 11:17 AM. If you have any questions, ask your nurse or doctor.        acetaminophen 325 MG tablet Commonly known as: TYLENOL Take 650 mg by mouth every 6 (six) hours as needed for headache.   acetaminophen 500 MG tablet Commonly known as: TYLENOL Give 2 tablets ( 1,000 mg total ) by mouth nightly for pain   albuterol 108 (90 Base) MCG/ACT inhaler Commonly known as: VENTOLIN HFA Inhale 1 puff into the lungs every 6 (six) hours as needed for shortness of breath.   aspirin 81 MG chewable tablet Chew 81 mg by mouth daily.   Biotin 5000 MCG Caps Take 1 capsule by mouth daily.   bisacodyl 10 MG suppository Commonly known as: DULCOLAX If not relieved by MOM, give 10 mg Bisacodyl suppositiory rectally X 1 dose in 24 hours as needed (Do not use constipation standing orders for residents with renal failure/CFR less than 30. Contact MD for orders) (Physician Order)   Claritin 10 MG tablet Generic drug: loratadine Take 10 mg by mouth daily.   Cranberry 450 MG Tabs Take 1 tablet by mouth 2 (two) times daily.   docusate sodium 100 MG capsule Commonly known as: COLACE Take 100 mg by mouth 2 (two) times daily.   dorzolamide-timolol 22.3-6.8 MG/ML ophthalmic solution Commonly  known as: COSOPT Place 1 drop into both eyes 2 (two) times daily.   Ensure Take 237 mLs by mouth 4 (four) times daily. D/t weight loss   ferrous sulfate 325 (65 FE) MG tablet Take 325 mg by mouth daily.   fludrocortisone 0.1 MG tablet Commonly known as: FLORINEF Take 0.1 mg by mouth every morning. for orthostatic hypotension   FLUoxetine 10 MG tablet Commonly known as: PROZAC Take 10 mg by mouth daily.   lactulose 10 GM/15ML solution Commonly known as: CHRONULAC Take 15 mLs (10 g total) by mouth daily as needed for severe constipation.   magnesium hydroxide 400 MG/5ML suspension Commonly known as: MILK OF MAGNESIA If no BM in 3 days, give 30 cc Milk of Magnesium p.o. x 1 dose in 24 hours as needed (Do not use standing constipation orders for residents with renal failure CFR less than 30. Contact MD for orders) (Physician Order)   mirtazapine 15 MG tablet Commonly known as: REMERON Take 7.5 mg by mouth  at bedtime.   nitroGLYCERIN 0.4 MG SL tablet Commonly known as: NITROSTAT Place 0.4 mg under the tongue every 5 (five) minutes as needed for chest pain. May use 3 times   polyethylene glycol 17 g packet Commonly known as: MIRALAX / GLYCOLAX Take 17 g by mouth daily.   potassium chloride SA 20 MEQ tablet Commonly known as: KLOR-CON Take 1 tablet (20 mEq total) by mouth daily.   QUEtiapine 100 MG tablet Commonly known as: SEROQUEL Take 100 mg by mouth at bedtime. Take along with a 25 mg tablet to = 125 mg qhs   QUEtiapine 25 MG tablet Commonly known as: SEROQUEL Take 25 mg by mouth at bedtime. Take along with a 100 mg tablet to = 125 mg qhs   RA SALINE ENEMA RE If not relieved by Biscodyl suppository, give disposable Saline Enema rectally X 1 dose/24 hrs as needed (Do not use constipation standing orders for residents with renal failure/CFR less than 30. Contact MD for orders)(Physician Or   senna 8.6 MG tablet Commonly known as: SENOKOT Take 2 tablets by mouth 2  (two) times daily as needed for constipation.   Systane 0.4-0.3 % Soln Generic drug: Polyethyl Glycol-Propyl Glycol Place 2 drops into both eyes every 2 (two) hours as needed (dry eyes).   travoprost (benzalkonium) 0.004 % ophthalmic solution Commonly known as: TRAVATAN Place 1 drop into both eyes at bedtime.   vitamin B-12 1000 MCG tablet Commonly known as: CYANOCOBALAMIN Take 1,500 mcg by mouth daily. 1-1/2 tablets to = 1500 mcg   Vitamin D3 1.25 MG (50000 UT) Caps Take 1 capsule by mouth once a week.       No orders of the defined types were placed in this encounter.   Immunization History  Administered Date(s) Administered  . Influenza Split 11/13/2011  . Influenza-Unspecified 02/13/2008, 11/30/2013, 12/20/2016  . PPD Test 12/15/2015  . Pneumococcal Conjugate-13 10/16/2016  . Pneumococcal-Unspecified 02/13/2008  . Td 02/12/2009    Social History   Tobacco Use  . Smoking status: Former Smoker    Packs/day: 0.50    Types: Cigarettes  . Smokeless tobacco: Never Used  Substance Use Topics  . Alcohol use: No    Review of Systems  DATA OBTAINED: from patient, nurse, medical record, family member GENERAL:  no fevers, fatigue, appetite changes SKIN: No itching, rash HEENT: No complaint RESPIRATORY: No cough, wheezing, SOB CARDIAC: No chest pain, palpitations, lower extremity edema  GI: No abdominal pain, No N/V/D or constipation, No heartburn or reflux  GU: No dysuria, frequency or urgency, or incontinence  MUSCULOSKELETAL: No unrelieved bone/joint pain NEUROLOGIC: No headache, dizziness  PSYCHIATRIC: No overt anxiety or sadness  Vitals:   01/19/19 1107  BP: 117/77  Pulse: 68  Resp: 18  Temp: 98 F (36.7 C)  SpO2: 96%   Body mass index is 15.41 kg/m. Physical Exam  GENERAL APPEARANCE: Alert, conversant, No acute distress  SKIN: No diaphoresis rash HEENT: Unremarkable RESPIRATORY: Breathing is even, unlabored. Lung sounds are clear    CARDIOVASCULAR: Heart RRR no murmurs, rubs or gallops. No peripheral edema  GASTROINTESTINAL: Abdomen is soft, non-tender, not distended w/ normal bowel sounds.  GENITOURINARY: Bladder non tender, not distended  MUSCULOSKELETAL: No abnormal joints or musculature NEUROLOGIC: Cranial nerves 2-12 grossly intact. Moves all extremities PSYCHIATRIC: Mood and affect appropriate to situation, no behavioral issues  Patient Active Problem List   Diagnosis Date Noted  . Allergic rhinitis 06/09/2018  . Pressure injury of skin 06/05/2018  . MRSA bacteremia  06/04/2018  . Chronic generalized pain 05/16/2018  . Dry eye syndrome of both eyes 02/28/2018  . Abnormal EKG 01/24/2017  . Delusions ([place])   . Paranoia ([place])   . Vitamin D deficiency 08/25/2016  . Dementia with behavioral problem ([place]) 03/25/2016  . S/P ORIF (open reduction internal fixation) fracture 01/08/2016  . Oral thrush 01/08/2016  . HTN (hypertension) 01/08/2016  . Closed hip fracture requiring operative repair, right, with routine healing, subsequent encounter 12/19/2015  . Bipolar disease, chronic ([place]) 12/17/2015  . Decubitus ulcer of hip, stage 3 ([place]) 12/11/2015  . Legally blind 08/16/2015  . Ankle pain   . Encephalopathy   . Altered mental status 01/26/2015  . Nausea and vomiting 01/17/2015  . Esophageal dysmotilities   . Urinary tract infectious disease   . Weakness 01/14/2015  . Schatzki's ring 01/14/2015  . CKD (chronic kidney disease), stage III ([place]) 01/14/2015  . Esophagus disorder   . Dysphagia 01/11/2015  . Lower urinary tract infectious disease 03/30/2014  . Hearing loss, sensorineural 03/04/2014  . Nasal lesion 03/04/2014  . Constipation 12/12/2012  . Hypokalemia 12/11/2012  . Orthostatic hypotension 12/11/2012  . Arterial tortuosity (aorta) 12/11/2012  . Protein-calorie malnutrition, severe ([place]) 12/10/2012  . Adult failure to thrive 12/09/2012  . Decreased rectal sphincter tone 11/21/2012  . Primary  open angle glaucoma of both eyes, severe stage 11/11/2012  . Idiopathic scoliosis 07/08/2012  . Macular atrophy, retinal 06/26/2011  . Vision disturbance 06/26/2011  . Glaucoma 06/26/2011  . Nonexudative senile macular degeneration of retina 06/21/2011  . Macula scar of posterior pole 06/21/2011  . Status post intraocular lens implant 06/21/2011  . Anemia of chronic disease 04/17/2011  . Drug-seeking behavior 02/26/2011  . Osteoporosis 02/26/2011  . Depression   . Anxiety   . COPD (chronic obstructive pulmonary disease) ([place])   . GERD (gastroesophageal reflux disease)   . Migraines   . Insomnia, unspecified     CMP     Component Value Date/Time   NA 144 12/06/2018   NA 140 07/31/2017   K 4.9 12/06/2018   K 5.0 07/31/2017   CL 104 12/06/2018   CO2 26 (A) 12/06/2018   GLUCOSE 89 06/08/2018 0317   BUN 23 (A) 12/06/2018   CREATININE 0.8 12/06/2018   CREATININE 0.70 06/08/2018 0317   CREATININE 0.62 07/31/2017   CALCIUM 9.6 12/06/2018   CALCIUM 8.9 07/31/2017   PROT 5.7 (L) 06/05/2018 0320   PROT 5.8 07/31/2017   ALBUMIN 2.5 (L) 06/05/2018 0320   ALBUMIN 3.5 07/31/2017   AST 17 09/02/2018   AST 14 07/31/2017   ALT 9 09/02/2018   ALT 5 07/31/2017   ALKPHOS 98 09/02/2018   ALKPHOS 87 07/31/2017   BILITOT 0.5 06/05/2018 0320   BILITOT 0.3 07/31/2017   GFRNONAA 71.38 12/06/2018   GFRNONAA 81.51 07/31/2017   GFRNONAA 64 11/09/2013 1135   GFRAA 82.73 12/06/2018   GFRAA 74 11/09/2013 1135   Recent Labs    06/06/18 0646 06/07/18 0317 06/08/18 0317  09/02/18 11/22/18 12/06/18  NA 145 144 142   < > 142 145 144  K 2.4* 2.9* 2.4*   < > 4.2 5.1 4.9  CL 111 114* 106  --   --  107 104  CO2 21* 19* 21*  --   --  26* 26*  GLUCOSE 100* 110* 89  --   --   --   --   BUN 15 12 11    < > 18 28* 23*  CREATININE 0.67  0.58 0.70   < > 0.7 0.6 0.8  CALCIUM 8.5* 8.7* 9.0  --   --   --  9.6  MG  --   --  1.6*  --   --   --   --    < > = values in this interval not displayed.    Recent Labs    06/04/18 1121 06/05/18 0320 09/02/18  AST 22 22 17   ALT 16 16 9   ALKPHOS 66 59 98  BILITOT 0.5 0.5  --   PROT 5.9* 5.7*  --   ALBUMIN 2.8* 2.5*  --    Recent Labs    06/06/18 0646 06/07/18 0317 06/08/18 0317 06/23/18 09/02/18 11/22/18  WBC 8.6 7.4 7.6 5.3 5.5 8.3  NEUTROABS  --   --   --  3 3 6   HGB 11.3* 11.9* 12.8 12.1 12.9 12.3  HCT 34.9* 36.1 38.8 35* 38 37  MCV 93.1 90.7 90.2  --   --   --   PLT 167 205 200 310 224 234   No results for input(s): CHOL, LDLCALC, TRIG in the last 8760 hours.  Invalid input(s): HCL No results found for: [facility] Lab Results  Component Value Date   TSH 0.59 07/31/2017   Lab Results  Component Value Date   HGBA1C 5.4 06/11/2016   Lab Results  Component Value Date   CHOL 134 07/31/2017   HDL 54 06/11/2016   LDLCALC 61 07/31/2017   TRIG 102 07/31/2017    Significant Diagnostic Results in last 30 days:  No results found.  Assessment and Plan  No problem-specific Assessment & Plan notes found for this encounter.   Labs/tests ordered:    [name], MD

## 2019-01-20 DIAGNOSIS — E559 Vitamin D deficiency, unspecified: Secondary | ICD-10-CM | POA: Diagnosis not present

## 2019-01-20 DIAGNOSIS — D649 Anemia, unspecified: Secondary | ICD-10-CM | POA: Diagnosis not present

## 2019-01-20 LAB — VITAMIN D 25 HYDROXY (VIT D DEFICIENCY, FRACTURES): Vit D, 25-Hydroxy: 60

## 2019-01-24 ENCOUNTER — Encounter: Payer: Self-pay | Admitting: Internal Medicine

## 2019-01-26 ENCOUNTER — Encounter: Payer: Self-pay | Admitting: Internal Medicine

## 2019-01-26 ENCOUNTER — Non-Acute Institutional Stay (SKILLED_NURSING_FACILITY): Payer: Medicare Other | Admitting: Internal Medicine

## 2019-01-26 DIAGNOSIS — I951 Orthostatic hypotension: Secondary | ICD-10-CM | POA: Diagnosis not present

## 2019-01-26 DIAGNOSIS — G301 Alzheimer's disease with late onset: Secondary | ICD-10-CM

## 2019-01-26 DIAGNOSIS — J449 Chronic obstructive pulmonary disease, unspecified: Secondary | ICD-10-CM | POA: Diagnosis not present

## 2019-01-26 DIAGNOSIS — F02818 Dementia in other diseases classified elsewhere, unspecified severity, with other behavioral disturbance: Secondary | ICD-10-CM

## 2019-01-26 DIAGNOSIS — E43 Unspecified severe protein-calorie malnutrition: Secondary | ICD-10-CM

## 2019-01-26 DIAGNOSIS — F0281 Dementia in other diseases classified elsewhere with behavioral disturbance: Secondary | ICD-10-CM

## 2019-01-26 DIAGNOSIS — N183 Chronic kidney disease, stage 3 unspecified: Secondary | ICD-10-CM

## 2019-01-26 NOTE — Progress Notes (Signed)
Location:  [facility] Room Number: 304-D Place of Service:  SNF [phone]) Provider:  Granville Lewis, PA-C  Patient Care Team: [name], MD as PCP - General (Internal Medicine) [name], MD (Gastroenterology) [name], NP (Obstetrics and Gynecology)  Extended Emergency Contact Information Primary Emergency Contact: [name],[name] Address: [address], [place] Phone: [phone] Mobile Phone: [phone] Relation: Daughter Secondary Emergency Contact: [place] Phone: [phone] Mobile Phone: [phone] Relation: Daughter  Code Status:  DNR  Goals of care: Advanced Directive information Advanced Directives 01/19/2019  Does Patient Have a Medical Advance Directive? Yes  Type of Advance Directive Out of facility DNR (pink MOST or yellow form)  Does patient want to make changes to medical advance directive? No - Patient declined  Copy of [place] in Chart? -  Would patient like information on creating a medical advance directive? -  Pre-existing out of facility DNR order (yellow form or pink MOST form) Yellow form placed in chart (order not valid for inpatient use)     Chief Complaint  Patient presents with  . Medical Management of Chronic Issues    Routine [facility] visit  Medical management of chronic medical conditions including dementia-bipolar disease-iron deficiency anemia-protein calorie malnutrition-COPD-history of hypotension and hypokalemia.    HPI:  Pt is a 85 y.o. female seen today for medical management of chronic diseases.  As noted above.  Nursing does not report any recent issues.  Patient is a poor historian secondary to dementia and speaks minimally but this afternoon she has no complaints.  It appears her weight is stable at around 76 pounds she does have a history of protein calorie  malnutrition continues on Remeron for appetite stimulation as well as supplements.  In regards to dementia with bipolar disease this appears stable with no recent behaviors noted she is on Seroquel she is also on Prozac for apparently scratching behaviors.  She continues on Florinef as well with a history of hypotension recent blood pressures have ranged from systolics in the 69S to low 100s.     Past Medical History:  Diagnosis Date  . Anxiety   . Arterial tortuosity (aorta) 12/11/2012  . Asthma   . Chronic kidney disease, stage IV (severe) ([place]) 12/11/2012  . Chronic mental illness   . CKD (chronic kidney disease), stage III 01/14/2015  . Closed hip fracture requiring operative repair, right, with routine healing, subsequent encounter 12/19/2015  . Colitis   . COPD (chronic obstructive pulmonary disease) ([place])   . Decreased rectal sphincter tone 11/21/2012  . Depression   . Drug-seeking behavior 02/26/2011   Use of multiple md to obtain benzos/narcotics pt must sign contract to receive controlled meds.   Marland Kitchen Dysphagia 01/11/2015  . GERD (gastroesophageal reflux disease)   . Glaucoma 06/26/2011  . Glaucoma of both eyes   . Hearing loss, sensorineural 03/04/2014   [facility]; 02/26/14. There is not a medical or surgical treatment that would benefit her type of hearing loss. We discussed amplification in an overview fashion.    Marland Kitchen History of fall   . Insomnia, unspecified   . Macular degeneration 06/26/2011  . Migraines   . Nasal bone fracture 03/30/2014  . Osteoporosis   . Osteoporosis 02/26/2011  . Presence of right artificial hip joint   . Protein-calorie malnutrition, severe ([place]) 12/10/2012  .  S/P ORIF (open reduction internal fixation) fracture 01/08/2016  . Schatzki's ring 01/14/2015  . Scoliosis   . Substance abuse ([place])    Benbzos and Narcotics, she does not get any of those medicines from [facility], we have empathetically told her that.  . Vertigo   . Vitamin B deficiency    . Vitamin D deficiency 08/25/2016  . Weight loss    Past Surgical History:  Procedure Laterality Date  . ABDOMINAL HYSTERECTOMY    . CATARACT EXTRACTION    . CHOLECYSTECTOMY    . ESOPHAGOGASTRODUODENOSCOPY (EGD) WITH PROPOFOL N/A 01/13/2015   Procedure: ESOPHAGOGASTRODUODENOSCOPY (EGD) WITH PROPOFOL;  Surgeon: [name], MD;  Location: [facility];  Service: Endoscopy;  Laterality: N/A;  . HIP ARTHROPLASTY Right 12/21/2015   Procedure: ARTHROPLASTY BIPOLAR HIP (HEMIARTHROPLASTY);  Surgeon: [name], MD;  Location: [facility];  Service: Orthopedics;  Laterality: Right;  . PARTIAL HYSTERECTOMY      Allergies  Allergen Reactions  . Amoxicillin     Per daughter  . Ampicillin     Per daughter  . Depakote [Valproic Acid]     DOES NOT WANT HER TO TAKE IT==Makes her hair fall out= per daughter who is poa  . Macrobid [Nitrofurantoin Macrocrystal] Rash    Per MAR  . Penicillins     Per daughter  . Acetaminophen Other (See Comments)    jaundice  . Azithromycin Other (See Comments)    Per MAR  . Butalbital   . Caffeine   . Doxycycline Other (See Comments)    Per MAR  . Escitalopram Oxalate Other (See Comments)    Per MAR  . Fioricet [Butalbital-Apap-Caffeine] Other (See Comments)    Drunk. Per MAR  . Latex Other (See Comments)    Per MAR  . Levofloxacin Other (See Comments)    Per MAR  . Levofloxacin   . Nsaids Other (See Comments)    This is not an allergy. The patient was told by Dr. Rockne Menghini to avoid NSAIDs and stop Vicoprofen in to 2014 to avoid nephrotoxicity.  Per MAR.  Marland Kitchen Oxycodone Other (See Comments)    reaction to synthetic codeine Per MAR  . Restasis [Cyclosporine] Other (See Comments)    Per MAR  . Sulfa Antibiotics Other (See Comments)    Per MAR  . Biaxin [Clarithromycin] Rash    With burning sensation Per [facility]    Outpatient Encounter Medications as of 01/26/2019  Medication Sig  . acetaminophen (TYLENOL) 325 MG tablet Take 650 mg by mouth every 6  (six) hours as needed for headache.  Marland Kitchen acetaminophen (TYLENOL) 500 MG tablet Give 2 tablets ( 1,000 mg total ) by mouth nightly for pain  . albuterol (PROVENTIL HFA;VENTOLIN HFA) 108 (90 BASE) MCG/ACT inhaler Inhale 1 puff into the lungs every 6 (six) hours as needed for shortness of breath.   Marland Kitchen aspirin 81 MG chewable tablet Chew 81 mg by mouth daily.  . Biotin 5000 MCG CAPS Take 1 capsule by mouth daily.   . bisacodyl (DULCOLAX) 10 MG suppository If not relieved by MOM, give 10 mg Bisacodyl suppositiory rectally X 1 dose in 24 hours as needed (Do not use constipation standing orders for residents with renal failure/CFR less than 30. Contact MD for orders) (Physician Order)  . Cholecalciferol (VITAMIN D3) 1.25 MG (50000 UT) CAPS Take 1 capsule by mouth once a week.  . Cranberry 450 MG TABS Take 1 tablet by mouth 2 (two) times daily.   Marland Kitchen docusate sodium (COLACE) 100  MG capsule Take 100 mg by mouth 2 (two) times daily.  . dorzolamide-timolol (COSOPT) 22.3-6.8 MG/ML ophthalmic solution Place 1 drop into both eyes 2 (two) times daily.  Marland Kitchen ENSURE (ENSURE) Take 237 mLs by mouth 4 (four) times daily. D/t weight loss  . ferrous sulfate 325 (65 FE) MG tablet Take 325 mg by mouth daily.   . fludrocortisone (FLORINEF) 0.1 MG tablet Take 0.1 mg by mouth every morning. for orthostatic hypotension  . FLUoxetine (PROZAC) 10 MG tablet Take 10 mg by mouth daily.  Marland Kitchen lactulose (CHRONULAC) 10 GM/15ML solution Take 15 mLs (10 g total) by mouth daily as needed for severe constipation.  Marland Kitchen loratadine (CLARITIN) 10 MG tablet Take 10 mg by mouth daily.   . magnesium hydroxide (MILK OF MAGNESIA) 400 MG/5ML suspension If no BM in 3 days, give 30 cc Milk of Magnesium p.o. x 1 dose in 24 hours as needed (Do not use standing constipation orders for residents with renal failure CFR less than 30. Contact MD for orders) (Physician Order)  . mirtazapine (REMERON) 15 MG tablet Take 7.5 mg by mouth at bedtime.  . nitroGLYCERIN  (NITROSTAT) 0.4 MG SL tablet Place 0.4 mg under the tongue every 5 (five) minutes as needed for chest pain. May use 3 times  . Polyethyl Glycol-Propyl Glycol (SYSTANE) 0.4-0.3 % SOLN Place 2 drops into both eyes every 2 (two) hours as needed (dry eyes).   . polyethylene glycol (MIRALAX / GLYCOLAX) packet Take 17 g by mouth daily.   . potassium chloride (K-DUR) 20 MEQ tablet Take 1 tablet (20 mEq total) by mouth daily.  . QUEtiapine (SEROQUEL) 100 MG tablet Take 100 mg by mouth at bedtime. Take along with a 25 mg tablet to = 125 mg qhs  . QUEtiapine (SEROQUEL) 25 MG tablet Take 25 mg by mouth at bedtime. Take along with a 100 mg tablet to = 125 mg qhs  . senna (SENOKOT) 8.6 MG tablet Take 2 tablets by mouth 2 (two) times daily as needed for constipation.  . Sodium Phosphates (RA SALINE ENEMA RE) If not relieved by Biscodyl suppository, give disposable Saline Enema rectally X 1 dose/24 hrs as needed (Do not use constipation standing orders for residents with renal failure/CFR less than 30. Contact MD for orders)(Physician Or  . travoprost, benzalkonium, (TRAVATAN) 0.004 % ophthalmic solution Place 1 drop into both eyes at bedtime.  . vitamin B-12 (CYANOCOBALAMIN) 1000 MCG tablet Take 1,500 mcg by mouth daily. 1-1/2 tablets to = 1500 mcg   No facility-administered encounter medications on file as of 01/26/2019.    Review of Systems   Is essentially unobtainable secondary to dementia please see HPI nursing does not report any recent acute issues  Immunization History  Administered Date(s) Administered  . Influenza Split 11/13/2011  . Influenza-Unspecified 02/13/2008, 11/30/2013, 12/20/2016  . PPD Test 12/15/2015  . Pneumococcal Conjugate-13 10/16/2016  . Pneumococcal-Unspecified 02/13/2008  . Td 02/12/2009   Pertinent  Health Maintenance Due  Topic Date Due  . INFLUENZA VACCINE  05/13/2019 (Originally 09/13/2018)  . DEXA SCAN  Completed  . PNA vac Low Risk Adult  Completed   Fall Risk   08/27/2017 08/22/2016  Falls in the past year? No Yes  Number falls in past yr: - 2 or more  Injury with Fall? - Yes  Comment - back of head   Functional Status Survey:    Vitals:   01/26/19 1214  BP: 91/65  Pulse: 63  Resp: 18  Temp: 98 F (36.7  C)  TempSrc: Oral  Weight: 76 lb 4.8 oz (34.6 kg)  Height: 4\' 11"  (6.553 m)   Body mass index is 15.41 kg/m. Physical Exam In general this is a somewhat frail-appearing elderly female in no distress lying comfortably in bed.  Her skin is warm and dry.  Eyes visual acuity appears to be intact sclera and conjunctive are clear.  Oropharynx appeared to be clear limited exam since she did not open her mouth very wide.  Chest is clear to auscultation with somewhat poor respiratory effort there is no labored breathing.  Heart is regular rate and rhythm without murmur gallop or rub she did not have significant lower extremity edema.  Abdomen is soft nontender with positive bowel sounds.  Musculoskeletal does hold her lower extremities in somewhat of a semicontracted position-she does have arthritic changes in her upper extremities most notably her hands-.  She is able to move her extremities.  Neurologic as noted above could not appreciate lateralizing findings her speech is clear but quite limited.  Psych she is oriented to self does follow simple verbal commands-she is pleasant and cooperative with exam but not speaking much.     Labs reviewed:  January 20, 2019.  Vitamin D level was greater than 60.   Recent Labs    06/06/18 0646 06/07/18 0317 06/08/18 0317 06/08/18 1052 09/02/18 0000 11/22/18 0000 12/06/18 0000  NA 145 144 142   < > 142 145 144  K 2.4* 2.9* 2.4*  --  4.2 5.1 4.9  CL 111 114* 106  --   --  107 104  CO2 21* 19* 21*  --   --  26* 26*  GLUCOSE 100* 110* 89  --   --   --   --   BUN 15 12 11    < > 18 28* 23*  CREATININE 0.67 0.58 0.70   < > 0.7 0.6 0.8  CALCIUM 8.5* 8.7* 9.0  --   --   --  9.6  MG   --   --  1.6*  --   --   --   --    < > = values in this interval not displayed.   Recent Labs    06/04/18 1121 06/05/18 0320 09/02/18 0000  AST 22 22 17   ALT 16 16 9   ALKPHOS 66 59 98  BILITOT 0.5 0.5  --   PROT 5.9* 5.7*  --   ALBUMIN 2.8* 2.5*  --    Recent Labs    06/06/18 0646 06/07/18 0317 06/08/18 0317 06/23/18 0000 09/02/18 0000 11/22/18 0000  WBC 8.6 7.4 7.6 5.3 5.5 8.3  NEUTROABS  --   --   --  3 3 6   HGB 11.3* 11.9* 12.8 12.1 12.9 12.3  HCT 34.9* 36.1 38.8 35* 38 37  MCV 93.1 90.7 90.2  --   --   --   PLT 167 205 200 310 224 234   Lab Results  Component Value Date   TSH 0.59 07/31/2017   Lab Results  Component Value Date   HGBA1C 5.4 06/11/2016   Lab Results  Component Value Date   CHOL 134 07/31/2017   HDL 54 06/11/2016   LDLCALC 61 07/31/2017   TRIG 102 07/31/2017    Significant Diagnostic Results in last 30 days:  No results found.  Assessment/Plan  #1 history of dementia with bipolar disease-this appears to be stable she does have orders for Seroquel 125 mg nightly she is also on Prozac 10  mg a day for apparently some behaviors with scratching and apparently this is helping.  2.  History of protein calorie malnutrition her weight appears to show stability she is on Remeron 7.5 mg nightly to help with appetite stimulation she also is on supplements including Ensure 4 times a day.  3.  History of iron deficiency anemia she is on iron 325 mg a day last hemoglobin was 12.3 back in October will have this updated.  4.  History of hypotension-she continues on Florinef 0.1 mg a day-systolics range from 96U to low 100s-she is not ambulatory and staff does not really report any issues with symptomatic hypotension.  5.  History of COPD she continues on albuterol as needed this is been stable apparently for some time.  6.  History of hypokalemia she is on potassium supplementation will have this updated last potassium was 4.9 in late October.  7.   Chronic kidney disease this appears stable as well last creatinine was 0.75 BUN of 22.7 in October will have this updated.  8.  History of constipation she continues on numerous agents including Colace twice daily she also has orders for MiraLAX as well as senna and lactulose as needed  [id number]

## 2019-01-27 ENCOUNTER — Encounter: Payer: Self-pay | Admitting: Internal Medicine

## 2019-01-28 LAB — CBC AND DIFFERENTIAL
HCT: 37 (ref 36–46)
Hemoglobin: 12 (ref 12.0–16.0)
Neutrophils Absolute: 4
Platelets: 227 (ref 150–399)
WBC: 6.1

## 2019-01-28 LAB — BASIC METABOLIC PANEL
BUN: 22 — AB (ref 4–21)
CO2: 28 — AB (ref 13–22)
Chloride: 103 (ref 99–108)
Creatinine: 0.7 (ref 0.5–1.1)
Glucose: 111
Potassium: 3.9 (ref 3.4–5.3)
Sodium: 141 (ref 137–147)

## 2019-01-28 LAB — CBC: RBC: 4.06 (ref 3.87–5.11)

## 2019-01-28 LAB — COMPREHENSIVE METABOLIC PANEL
Calcium: 9.5 (ref 8.7–10.7)
GFR calc Af Amer: 89.83
GFR calc non Af Amer: 77.51

## 2019-02-12 DIAGNOSIS — Z23 Encounter for immunization: Secondary | ICD-10-CM | POA: Diagnosis not present

## 2019-02-20 ENCOUNTER — Encounter: Payer: Self-pay | Admitting: Internal Medicine

## 2019-02-20 ENCOUNTER — Non-Acute Institutional Stay (SKILLED_NURSING_FACILITY): Payer: Medicare Other | Admitting: Internal Medicine

## 2019-02-20 DIAGNOSIS — I10 Essential (primary) hypertension: Secondary | ICD-10-CM | POA: Diagnosis not present

## 2019-02-20 DIAGNOSIS — J449 Chronic obstructive pulmonary disease, unspecified: Secondary | ICD-10-CM | POA: Diagnosis not present

## 2019-02-20 DIAGNOSIS — J3089 Other allergic rhinitis: Secondary | ICD-10-CM | POA: Diagnosis not present

## 2019-02-20 NOTE — Progress Notes (Signed)
Location:  [facility] Room Number: 304-D Place of Service:  SNF (31)  [name], MD  Patient Care Team: [name], MD as PCP - General (Internal Medicine) [name], MD (Gastroenterology) [name], NP (Obstetrics and Gynecology)  Extended Emergency Contact Information Primary Emergency Contact: [name],[name] Address: [place], [place] Phone: [phone] Mobile Phone: [phone] Relation: Daughter Secondary Emergency Contact: [name] Phone: [phone] Mobile Phone: [phone] Relation: Daughter    Allergies: Amoxicillin, Ampicillin, Depakote [valproic acid], Macrobid [nitrofurantoin macrocrystal], Penicillins, Acetaminophen, Azithromycin, Butalbital, Caffeine, Doxycycline, Escitalopram oxalate, Fioricet [butalbital-apap-caffeine], Latex, Levofloxacin, Levofloxacin, Nsaids, Oxycodone, Restasis [cyclosporine], Sulfa antibiotics, and Biaxin [clarithromycin]  Chief Complaint  Patient presents with  . Medical Management of Chronic Issues    Routine [facility] visit    HPI: Patient is an 87 y.o. female who is being seen for routine issues of COPD, allergic rhinitis, and hypertension.  Past Medical History:  Diagnosis Date  . Anxiety   . Arterial tortuosity (aorta) 12/11/2012  . Asthma   . Chronic kidney disease, stage IV (severe) ([place]) 12/11/2012  . Chronic mental illness   . CKD (chronic kidney disease), stage III 01/14/2015  . Closed hip fracture requiring operative repair, right, with routine healing, subsequent encounter 12/19/2015  . Colitis   . COPD (chronic obstructive pulmonary disease) ([place])   . Decreased rectal sphincter tone 11/21/2012  . Depression   . Drug-seeking behavior 02/26/2011   Use of multiple md to obtain benzos/narcotics pt must sign contract to receive controlled meds.   Marland Kitchen Dysphagia  01/11/2015  . GERD (gastroesophageal reflux disease)   . Glaucoma 06/26/2011  . Glaucoma of both eyes   . Hearing loss, sensorineural 03/04/2014   [facility]; 02/26/14. There is not a medical or surgical treatment that would benefit her type of hearing loss. We discussed amplification in an overview fashion.    Marland Kitchen History of fall   . Insomnia, unspecified   . Macular degeneration 06/26/2011  . Migraines   . Nasal bone fracture 03/30/2014  . Osteoporosis   . Osteoporosis 02/26/2011  . Presence of right artificial hip joint   . Protein-calorie malnutrition, severe ([place]) 12/10/2012  . S/P ORIF (open reduction internal fixation) fracture 01/08/2016  . Schatzki's ring 01/14/2015  . Scoliosis   . Substance abuse ([place])    Benbzos and Narcotics, she does not get any of those medicines from [facility], we have empathetically told her that.  . Vertigo   . Vitamin B deficiency   . Vitamin D deficiency 08/25/2016  . Weight loss     Past Surgical History:  Procedure Laterality Date  . ABDOMINAL HYSTERECTOMY    . CATARACT EXTRACTION    . CHOLECYSTECTOMY    . ESOPHAGOGASTRODUODENOSCOPY (EGD) WITH PROPOFOL N/A 01/13/2015   Procedure: ESOPHAGOGASTRODUODENOSCOPY (EGD) WITH PROPOFOL;  Surgeon: [name], MD;  Location: [facility];  Service: Endoscopy;  Laterality: N/A;  . HIP ARTHROPLASTY Right 12/21/2015   Procedure: ARTHROPLASTY BIPOLAR HIP (HEMIARTHROPLASTY);  Surgeon: [name], MD;  Location: [facility];  Service: Orthopedics;  Laterality: Right;  . PARTIAL HYSTERECTOMY      Allergies as of 02/20/2019      Reactions   Amoxicillin    Per daughter   Ampicillin    Per daughter   Depakote [valproic Acid]    DOES NOT WANT HER TO  TAKE IT==Makes her hair fall out= per daughter who is poa   Macrobid [nitrofurantoin Macrocrystal] Rash   Per MAR   Penicillins    Per daughter   Acetaminophen Other (See Comments)   jaundice   Azithromycin Other (See Comments)   Per MAR   Butalbital     Caffeine    Doxycycline Other (See Comments)   Per MAR   Escitalopram Oxalate Other (See Comments)   Per MAR   Fioricet [butalbital-apap-caffeine] Other (See Comments)   Drunk. Per MAR   Latex Other (See Comments)   Per MAR   Levofloxacin Other (See Comments)   Per MAR   Levofloxacin    Nsaids Other (See Comments)   This is not an allergy. The patient was told by Dr. Rockne Menghini to avoid NSAIDs and stop Vicoprofen in to 2014 to avoid nephrotoxicity. Per MAR.   Oxycodone Other (See Comments)   reaction to synthetic codeine Per MAR   Restasis [cyclosporine] Other (See Comments)   Per MAR   Sulfa Antibiotics Other (See Comments)   Per MAR   Biaxin [clarithromycin] Rash   With burning sensation Per [facility]      Medication List       Accurate as of February 20, 2019 11:59 PM. If you have any questions, ask your nurse or doctor.        acetaminophen 325 MG tablet Commonly known as: TYLENOL Take 650 mg by mouth every 6 (six) hours as needed for headache.   acetaminophen 500 MG tablet Commonly known as: TYLENOL Give 2 tablets ( 1,000 mg total ) by mouth nightly for pain   albuterol 108 (90 Base) MCG/ACT inhaler Commonly known as: VENTOLIN HFA Inhale 1 puff into the lungs every 6 (six) hours as needed for shortness of breath.   aspirin 81 MG chewable tablet Chew 81 mg by mouth daily.   Biotin 5000 MCG Caps Take 1 capsule by mouth daily.   bisacodyl 10 MG suppository Commonly known as: DULCOLAX If not relieved by MOM, give 10 mg Bisacodyl suppositiory rectally X 1 dose in 24 hours as needed (Do not use constipation standing orders for residents with renal failure/CFR less than 30. Contact MD for orders) (Physician Order)   carboxymethylcellulose 1 % ophthalmic solution Apply 1 drop to eye at bedtime.   Claritin 10 MG tablet Generic drug: loratadine Take 10 mg by mouth daily.   Cranberry 450 MG Tabs Take 1 tablet by mouth 2 (two) times daily.   docusate sodium 100 MG  capsule Commonly known as: COLACE Take 100 mg by mouth 2 (two) times daily.   dorzolamide-timolol 22.3-6.8 MG/ML ophthalmic solution Commonly known as: COSOPT Place 1 drop into both eyes 2 (two) times daily.   Ensure Take 237 mLs by mouth 4 (four) times daily. D/t weight loss   ferrous sulfate 325 (65 FE) MG tablet Take 325 mg by mouth daily.   fludrocortisone 0.1 MG tablet Commonly known as: FLORINEF Take 0.1 mg by mouth every morning. for orthostatic hypotension   FLUoxetine 10 MG tablet Commonly known as: PROZAC Take 10 mg by mouth daily.   lactulose 10 GM/15ML solution Commonly known as: CHRONULAC Take 15 mLs (10 g total) by mouth daily as needed for severe constipation.   magnesium hydroxide 400 MG/5ML suspension Commonly known as: MILK OF MAGNESIA If no BM in 3 days, give 30 cc Milk of Magnesium p.o. x 1 dose in 24 hours as needed (Do not use standing constipation orders for residents with  renal failure CFR less than 30. Contact MD for orders) (Physician Order)   mirtazapine 15 MG tablet Commonly known as: REMERON Take 7.5 mg by mouth at bedtime.   nitroGLYCERIN 0.4 MG SL tablet Commonly known as: NITROSTAT Place 0.4 mg under the tongue every 5 (five) minutes as needed for chest pain. May use 3 times   polyethylene glycol 17 g packet Commonly known as: MIRALAX / GLYCOLAX Take 17 g by mouth daily.   potassium chloride SA 20 MEQ tablet Commonly known as: KLOR-CON Take 1 tablet (20 mEq total) by mouth daily.   QUEtiapine 100 MG tablet Commonly known as: SEROQUEL Take 100 mg by mouth at bedtime. Take along with a 25 mg tablet to = 125 mg qhs   QUEtiapine 25 MG tablet Commonly known as: SEROQUEL Take 25 mg by mouth at bedtime. Take along with a 100 mg tablet to = 125 mg qhs   RA SALINE ENEMA RE If not relieved by Biscodyl suppository, give disposable Saline Enema rectally X 1 dose/24 hrs as needed (Do not use constipation standing orders for residents with  renal failure/CFR less than 30. Contact MD for orders)(Physician Or   senna 8.6 MG tablet Commonly known as: SENOKOT Take 2 tablets by mouth 2 (two) times daily as needed for constipation.   Systane 0.4-0.3 % Soln Generic drug: Polyethyl Glycol-Propyl Glycol Place 2 drops into both eyes every 2 (two) hours as needed (dry eyes).   travoprost (benzalkonium) 0.004 % ophthalmic solution Commonly known as: TRAVATAN Place 1 drop into both eyes at bedtime.   vitamin B-12 1000 MCG tablet Commonly known as: CYANOCOBALAMIN Take 1,500 mcg by mouth daily. 1-1/2 tablets to = 1500 mcg   Vitamin D3 1.25 MG (50000 UT) Caps Take 1 capsule by mouth once a week.       No orders of the defined types were placed in this encounter.   Immunization History  Administered Date(s) Administered  . Influenza Split 11/13/2011  . Influenza-Unspecified 02/13/2008, 11/30/2013, 12/20/2016  . Moderna SARS-COVID-2 Vaccination 02/12/2019  . PPD Test 12/15/2015  . Pneumococcal Conjugate-13 10/16/2016  . Pneumococcal-Unspecified 02/13/2008  . Td 02/12/2009    Social History   Tobacco Use  . Smoking status: Former Smoker    Packs/day: 0.50    Types: Cigarettes  . Smokeless tobacco: Never Used  Substance Use Topics  . Alcohol use: No    Review of Systems  GENERAL:  no fevers, fatigue, appetite changes SKIN: No itching, rash HEENT: No complaint RESPIRATORY: No cough, wheezing, SOB CARDIAC: No chest pain, palpitations, lower extremity edema  GI: No abdominal pain, No N/V/D or constipation, No heartburn or reflux  GU: No dysuria, frequency or urgency, or incontinence  MUSCULOSKELETAL: No unrelieved bone/joint pain NEUROLOGIC: No headache, dizziness  PSYCHIATRIC: No overt anxiety or sadness  Vitals:   02/20/19 0944  BP: 125/86  Pulse: 68  Resp: 18  Temp: (!) 97.1 F (36.2 C)   Body mass index is 14.48 kg/m. Physical Exam  GENERAL APPEARANCE: Alert, minimally conversant, No acute distress   SKIN: No diaphoresis rash HEENT: Unremarkable RESPIRATORY: Breathing is even, unlabored. Lung sounds are clear   CARDIOVASCULAR: Heart RRR no murmurs, rubs or gallops. No peripheral edema  GASTROINTESTINAL: Abdomen is soft, non-tender, not distended w/ normal bowel sounds.  GENITOURINARY: Bladder non tender, not distended  MUSCULOSKELETAL: No abnormal joints or musculature NEUROLOGIC: Cranial nerves 2-12 grossly intact. Moves all extremities PSYCHIATRIC: Mood and affect with dementia, no behavioral issues  Patient Active Problem  List   Diagnosis Date Noted  . Allergic rhinitis 06/09/2018  . Pressure injury of skin 06/05/2018  . MRSA bacteremia 06/04/2018  . Chronic generalized pain 05/16/2018  . Dry eye syndrome of both eyes 02/28/2018  . Abnormal EKG 01/24/2017  . Delusions ([place])   . Paranoia ([place])   . Vitamin D deficiency 08/25/2016  . Dementia with behavioral problem ([place]) 03/25/2016  . S/P ORIF (open reduction internal fixation) fracture 01/08/2016  . Oral thrush 01/08/2016  . HTN (hypertension) 01/08/2016  . Closed hip fracture requiring operative repair, right, with routine healing, subsequent encounter 12/19/2015  . Bipolar disease, chronic ([place]) 12/17/2015  . Decubitus ulcer of hip, stage 3 ([place]) 12/11/2015  . Legally blind 08/16/2015  . Ankle pain   . Encephalopathy   . Altered mental status 01/26/2015  . Nausea and vomiting 01/17/2015  . Esophageal dysmotilities   . Urinary tract infectious disease   . Weakness 01/14/2015  . Schatzki's ring 01/14/2015  . CKD (chronic kidney disease), stage III ([place]) 01/14/2015  . Esophagus disorder   . Dysphagia 01/11/2015  . Lower urinary tract infectious disease 03/30/2014  . Hearing loss, sensorineural 03/04/2014  . Nasal lesion 03/04/2014  . Constipation 12/12/2012  . Hypokalemia 12/11/2012  . Orthostatic hypotension 12/11/2012  . Arterial tortuosity (aorta) 12/11/2012  . Protein-calorie malnutrition, severe ([place])  12/10/2012  . Adult failure to thrive 12/09/2012  . Decreased rectal sphincter tone 11/21/2012  . Primary open angle glaucoma of both eyes, severe stage 11/11/2012  . Idiopathic scoliosis 07/08/2012  . Macular atrophy, retinal 06/26/2011  . Vision disturbance 06/26/2011  . Glaucoma 06/26/2011  . Nonexudative senile macular degeneration of retina 06/21/2011  . Macula scar of posterior pole 06/21/2011  . Status post intraocular lens implant 06/21/2011  . Anemia of chronic disease 04/17/2011  . Drug-seeking behavior 02/26/2011  . Osteoporosis 02/26/2011  . Depression   . Anxiety   . COPD (chronic obstructive pulmonary disease) ([place])   . GERD (gastroesophageal reflux disease)   . Migraines   . Insomnia, unspecified     CMP     Component Value Date/Time   NA 141 01/28/2019 0000   NA 140 07/31/2017 0000   K 3.9 01/28/2019 0000   K 5.0 07/31/2017 0000   CL 103 01/28/2019 0000   CO2 28 (A) 01/28/2019 0000   GLUCOSE 89 06/08/2018 0317   BUN 22 (A) 01/28/2019 0000   CREATININE 0.7 01/28/2019 0000   CREATININE 0.70 06/08/2018 0317   CREATININE 0.62 07/31/2017 0000   CALCIUM 9.5 01/28/2019 0000   CALCIUM 8.9 07/31/2017 0000   PROT 5.7 (L) 06/05/2018 0320   PROT 5.8 07/31/2017 0000   ALBUMIN 2.5 (L) 06/05/2018 0320   ALBUMIN 3.5 07/31/2017 0000   AST 17 09/02/2018 0000   AST 14 07/31/2017 0000   ALT 9 09/02/2018 0000   ALT 5 07/31/2017 0000   ALKPHOS 98 09/02/2018 0000   ALKPHOS 87 07/31/2017 0000   BILITOT 0.5 06/05/2018 0320   BILITOT 0.3 07/31/2017 0000   GFRNONAA 77.51 01/28/2019 0000   GFRNONAA 81.51 07/31/2017 0000   GFRNONAA 64 11/09/2013 1135   GFRAA 89.83 01/28/2019 0000   GFRAA 74 11/09/2013 1135   Recent Labs    06/06/18 0646 06/07/18 0317 06/08/18 0317 06/08/18 1052 11/22/18 0000 12/06/18 0000 01/28/19 0000  NA 145 144 142   < > 145 144 141  K 2.4* 2.9* 2.4*  --  5.1 4.9 3.9  CL 111 114* 106  --  107 104  103  CO2 21* 19* 21*  --  26* 26* 28*    GLUCOSE 100* 110* 89  --   --   --   --   BUN 15 12 11    < > 28* 23* 22*  CREATININE 0.67 0.58 0.70   < > 0.6 0.8 0.7  CALCIUM 8.5* 8.7* 9.0  --   --  9.6 9.5  MG  --   --  1.6*  --   --   --   --    < > = values in this interval not displayed.   Recent Labs    06/04/18 1121 06/05/18 0320 09/02/18 0000  AST 22 22 17   ALT 16 16 9   ALKPHOS 66 59 98  BILITOT 0.5 0.5  --   PROT 5.9* 5.7*  --   ALBUMIN 2.8* 2.5*  --    Recent Labs    06/06/18 0646 06/07/18 0317 06/07/18 0317 06/08/18 0317 09/02/18 0000 11/22/18 0000 01/28/19 0000  WBC 8.6 7.4   < > 7.6 5.5 8.3 6.1  NEUTROABS  --   --    < >  --  3 6 4   HGB 11.3* 11.9*  --  12.8 12.9 12.3 12.0  HCT 34.9* 36.1  --  38.8 38 37 37  MCV 93.1 90.7  --  90.2  --   --   --   PLT 167 205  --  200 224 234 227   < > = values in this interval not displayed.   No results for input(s): CHOL, LDLCALC, TRIG in the last 8760 hours.  Invalid input(s): HCL No results found for: [facility] Lab Results  Component Value Date   TSH 0.59 07/31/2017   Lab Results  Component Value Date   HGBA1C 5.4 06/11/2016   Lab Results  Component Value Date   CHOL 134 07/31/2017   HDL 54 06/11/2016   LDLCALC 61 07/31/2017   TRIG 102 07/31/2017    Significant Diagnostic Results in last 30 days:  No results found.  Assessment and Plan  COPD (chronic obstructive pulmonary disease) (HCC) No recent exacerbations; continue albuterol as needed  Allergic rhinitis Chronic and stable; continue Claritin 10 mg daily  HTN (hypertension) Controlled on the medications; continue to monitor     [name], MD

## 2019-02-22 ENCOUNTER — Encounter: Payer: Self-pay | Admitting: Internal Medicine

## 2019-02-22 NOTE — Assessment & Plan Note (Signed)
Chronic and stable; continue Claritin 10 mg daily 

## 2019-02-22 NOTE — Assessment & Plan Note (Signed)
Controlled on the medications; continue to monitor

## 2019-02-22 NOTE — Assessment & Plan Note (Signed)
No recent exacerbations; continue albuterol as needed

## 2019-03-05 ENCOUNTER — Non-Acute Institutional Stay (SKILLED_NURSING_FACILITY): Payer: Medicare Other | Admitting: Internal Medicine

## 2019-03-05 DIAGNOSIS — F0281 Dementia in other diseases classified elsewhere with behavioral disturbance: Secondary | ICD-10-CM

## 2019-03-05 DIAGNOSIS — F02818 Dementia in other diseases classified elsewhere, unspecified severity, with other behavioral disturbance: Secondary | ICD-10-CM

## 2019-03-05 DIAGNOSIS — R634 Abnormal weight loss: Secondary | ICD-10-CM | POA: Diagnosis not present

## 2019-03-05 DIAGNOSIS — G301 Alzheimer's disease with late onset: Secondary | ICD-10-CM | POA: Diagnosis not present

## 2019-03-05 DIAGNOSIS — I951 Orthostatic hypotension: Secondary | ICD-10-CM | POA: Diagnosis not present

## 2019-03-05 NOTE — Progress Notes (Signed)
This is a acute visit.  Level of care skilled.  [facility]ility [facility].  Chief complaint-follow-up of weight loss.  History of present illness.  Patient is an 86 year old female who is a long-term resident of facility she does have a history of significant dementia with bipolar disease along with protein calorie malnutrition.  She is on Remeron for appetite stimulation and numerous supplements and apparently takes these fairly well. Per nursing.  She continues to lose some weight appears about 5 or 6 pounds over the past 6 weeks or so.  Her diet has been liberalized as well as diet texture adjustments made-family has previously expressed desires for no artificial feeding tube.  Dietary has left a note suggesting doing weekly weights to keep an eye on this and I certainly feel that is a good idea.  Patient herself does not appear to be in any distress vital signs are stable.  Past Medical History:  Diagnosis Date  . Anxiety   . Arterial tortuosity (aorta) 12/11/2012  . Asthma   . Chronic kidney disease, stage IV (severe) ([place]) 12/11/2012  . Chronic mental illness   . CKD (chronic kidney disease), stage III 01/14/2015  . Closed hip fracture requiring operative repair, right, with routine healing, subsequent encounter 12/19/2015  . Colitis   . COPD (chronic obstructive pulmonary disease) ([place])   . Decreased rectal sphincter tone 11/21/2012  . Depression   . Drug-seeking behavior 02/26/2011   Use of multiple md to obtain benzos/narcotics pt must sign contract to receive controlled meds.   Marland Kitchen Dysphagia 01/11/2015  . GERD (gastroesophageal reflux disease)   . Glaucoma 06/26/2011  . Glaucoma of both eyes   . Hearing loss, sensorineural 03/04/2014   [facility]; 02/26/14. There is not a medical or surgical treatment that would benefit her type of hearing loss. We discussed amplification in an overview fashion.    Marland Kitchen History of fall   . Insomnia, unspecified   .  Macular degeneration 06/26/2011  . Migraines   . Nasal bone fracture 03/30/2014  . Osteoporosis   . Osteoporosis 02/26/2011  . Presence of right artificial hip joint   . Protein-calorie malnutrition, severe ([place]) 12/10/2012  . S/P ORIF (open reduction internal fixation) fracture 01/08/2016  . Schatzki's ring 01/14/2015  . Scoliosis   . Substance abuse ([place])    Benbzos and Narcotics, she does not get any of those medicines from [facility], we have empathetically told her that.  . Vertigo   . Vitamin B deficiency   . Vitamin D deficiency 08/25/2016  . Weight loss          Past Surgical History:  Procedure Laterality Date  . ABDOMINAL HYSTERECTOMY    . CATARACT EXTRACTION    . CHOLECYSTECTOMY    . ESOPHAGOGASTRODUODENOSCOPY (EGD) WITH PROPOFOL N/A 01/13/2015   Procedure: ESOPHAGOGASTRODUODENOSCOPY (EGD) WITH PROPOFOL;  Surgeon: [name], MD;  Location: [facility];  Service: Endoscopy;  Laterality: N/A;  . HIP ARTHROPLASTY Right 12/21/2015   Procedure: ARTHROPLASTY BIPOLAR HIP (HEMIARTHROPLASTY);  Surgeon: [name], MD;  Location: [place];  Service: Orthopedics;  Laterality: Right;  . PARTIAL HYSTERECTOMY           Allergies as of 02/20/2019      Reactions   Amoxicillin    Per daughter   Ampicillin    Per daughter   Depakote [valproic Acid]    DOES NOT WANT HER TO TAKE IT==Makes her hair fall out= per daughter who is poa   Macrobid [  nitrofurantoin Macrocrystal] Rash   Per MAR   Penicillins    Per daughter   Acetaminophen Other (See Comments)   jaundice   Azithromycin Other (See Comments)   Per MAR   Butalbital    Caffeine    Doxycycline Other (See Comments)   Per MAR   Escitalopram Oxalate Other (See Comments)   Per MAR   Fioricet [butalbital-apap-caffeine] Other (See Comments)   Drunk. Per MAR   Latex Other (See Comments)   Per MAR   Levofloxacin Other (See Comments)   Per MAR   Levofloxacin     Nsaids Other (See Comments)   This is not an allergy. The patient was told by Dr. Rockne Menghini to avoid NSAIDs and stop Vicoprofen in to 2014 to avoid nephrotoxicity. Per MAR.   Oxycodone Other (See Comments)   reaction to synthetic codeine Per MAR   Restasis [cyclosporine] Other (See Comments)   Per MAR   Sulfa Antibiotics Other (See Comments)   Per MAR   Biaxin [clarithromycin] Rash   With burning sensation Per MAR         Medication List             acetaminophen 325 MG tablet Commonly known as: TYLENOL Take 650 mg by mouth every 6 (six) hours as needed for headache.   acetaminophen 500 MG tablet Commonly known as: TYLENOL Give 2 tablets ( 1,000 mg total ) by mouth nightly for pain   albuterol 108 (90 Base) MCG/ACT inhaler Commonly known as: VENTOLIN HFA Inhale 1 puff into the lungs every 6 (six) hours as needed for shortness of breath.   aspirin 81 MG chewable tablet Chew 81 mg by mouth daily.   Biotin 5000 MCG Caps Take 1 capsule by mouth daily.   bisacodyl 10 MG suppository Commonly known as: DULCOLAX If not relieved by MOM, give 10 mg Bisacodyl suppositiory rectally X 1 dose in 24 hours as needed (Do not use constipation standing orders for residents with renal failure/CFR less than 30. Contact MD for orders) (Physician Order)   carboxymethylcellulose 1 % ophthalmic solution Apply 1 drop to eye at bedtime.   Claritin 10 MG tablet Generic drug: loratadine Take 10 mg by mouth daily.   Cranberry 450 MG Tabs Take 1 tablet by mouth 2 (two) times daily.   docusate sodium 100 MG capsule Commonly known as: COLACE Take 100 mg by mouth 2 (two) times daily.   dorzolamide-timolol 22.3-6.8 MG/ML ophthalmic solution Commonly known as: COSOPT Place 1 drop into both eyes 2 (two) times daily.   Ensure Take 237 mLs by mouth 4 (four) times daily. D/t weight loss   ferrous sulfate 325 (65 FE) MG tablet Take 325 mg by mouth daily.    fludrocortisone 0.1 MG tablet Commonly known as: FLORINEF Take 0.1 mg by mouth every morning. for orthostatic hypotension   FLUoxetine 10 MG tablet Commonly known as: PROZAC Take 10 mg by mouth daily.   lactulose 10 GM/15ML solution Commonly known as: CHRONULAC Take 15 mLs (10 g total) by mouth daily as needed for severe constipation.   magnesium hydroxide 400 MG/5ML suspension Commonly known as: MILK OF MAGNESIA If no BM in 3 days, give 30 cc Milk of Magnesium p.o. x 1 dose in 24 hours as needed (Do not use standing constipation orders for residents with renal failure CFR less than 30. Contact MD for orders) (Physician Order)   mirtazapine 15 MG tablet Commonly known as: REMERON Take 7.5 mg by mouth at bedtime.  nitroGLYCERIN 0.4 MG SL tablet Commonly known as: NITROSTAT Place 0.4 mg under the tongue every 5 (five) minutes as needed for chest pain. May use 3 times   polyethylene glycol 17 g packet Commonly known as: MIRALAX / GLYCOLAX Take 17 g by mouth daily.   potassium chloride SA 20 MEQ tablet Commonly known as: KLOR-CON Take 1 tablet (20 mEq total) by mouth daily.   QUEtiapine 100 MG tablet Commonly known as: SEROQUEL Take 100 mg by mouth at bedtime. Take along with a 25 mg tablet to = 125 mg qhs   QUEtiapine 25 MG tablet Commonly known as: SEROQUEL Take 25 mg by mouth at bedtime. Take along with a 100 mg tablet to = 125 mg qhs   RA SALINE ENEMA RE If not relieved by Biscodyl suppository, give disposable Saline Enema rectally X 1 dose/24 hrs as needed (Do not use constipation standing orders for residents with renal failure/CFR less than 30. Contact MD for orders)(Physician Or   senna 8.6 MG tablet Commonly known as: SENOKOT Take 2 tablets by mouth 2 (two) times daily as needed for constipation.   Systane 0.4-0.3 % Soln Generic drug: Polyethyl Glycol-Propyl Glycol Place 2 drops into both eyes every 2 (two) hours as needed (dry eyes).    travoprost (benzalkonium) 0.004 % ophthalmic solution Commonly known as: TRAVATAN Place 1 drop into both eyes at bedtime.   vitamin B-12 1000 MCG tablet Commonly known as: CYANOCOBALAMIN Take 1,500 mcg by mouth daily. 1-1/2 tablets to = 1500 mcg   Vitamin D3 1.25 MG (50000 UT) Caps Take 1 capsule by mouth once a week.       No orders of the defined types were placed in this encounter.       Immunization History  Administered Date(s) Administered  . Influenza Split 11/13/2011  . Influenza-Unspecified 02/13/2008, 11/30/2013, 12/20/2016  . Moderna SARS-COVID-2 Vaccination 02/12/2019  . PPD Test 12/15/2015  . Pneumococcal Conjugate-13 10/16/2016  . Pneumococcal-Unspecified 02/13/2008  . Td 02/12/2009    Social History        Tobacco Use  . Smoking status: Former Smoker    Packs/day: 0.50    Types: Cigarettes  . Smokeless tobacco: Never Used  Substance Use Topics  . Alcohol use: No    Review of systems.  This is very limited secondary to dementia nursing does not really report any issues patient is not really speaking much today.  Physical exam.  Temperature is 97.6 pulse 74 respirations 18 blood pressure 118/75.  In general this is a somewhat frail-appearing elderly female in no distress she is minimally verbal.  Her skin is warm and dry.  Eyes visual acuity appears to be intact sclera and conjunctive are clear.  Oropharynx is clear with what appears to be some food residue on tongue  Chest is clear to auscultation with somewhat poor respiratory effort.  Heart is regular rate and rhythm without murmur gallop or rub she does not have significant lower extremity edema.  Abdomen is soft nontender with positive bowel sounds.  Musculoskeletal has general frailty appears able to move extremities at baseline although limited since she is in bed.  Neurologic she is alert could not really appreciate true lateralizing findings.  Psych findings  consistent with dementia.  Labs.  January 28, 2019.  WBC 6.1 hemoglobin 12.0 platelets 227.  Sodium 141 potassium 3.9 BUN 22.4 creatinine 0.7.  Recent Labs    06/06/18 0646 06/07/18 0317 06/08/18 0317 06/08/18 1052 11/22/18 0000 12/06/18 0000 01/28/19 0000  NA 145 144 142   < > 145 144 141  K 2.4* 2.9* 2.4*  --  5.1 4.9 3.9  CL 111 114* 106  --  107 104 103  CO2 21* 19* 21*  --  26* 26* 28*  GLUCOSE 100* 110* 89  --   --   --   --   BUN 15 12 11    < > 28* 23* 22*  CREATININE 0.67 0.58 0.70   < > 0.6 0.8 0.7  CALCIUM 8.5* 8.7* 9.0  --   --  9.6 9.5  MG  --   --  1.6*  --   --   --   --    < > = values in this interval not displayed.     Recent Labs (within last 365 days)       Recent Labs    06/04/18 1121 06/05/18 0320 09/02/18 0000  AST 22 22 17   ALT 16 16 9   ALKPHOS 66 59 98  BILITOT 0.5 0.5  --   PROT 5.9* 5.7*  --   ALBUMIN 2.8* 2.5*  --      Recent Labs (within last 365 days)           Recent Labs    06/06/18 0646 06/07/18 0317 06/07/18 0317 06/08/18 0317 09/02/18 0000 11/22/18 0000 01/28/19 0000  WBC 8.6 7.4   < > 7.6 5.5 8.3 6.1  NEUTROABS  --   --    < >  --  3 6 4   HGB 11.3* 11.9*  --  12.8 12.9 12.3 12.0  HCT 34.9* 36.1  --  38.8 38 37 37  MCV 93.1 90.7  --  90.2  --   --   --   PLT 167 205  --  200 224 234 227   < > = values in this interval not displayed.       Assessment and plan.  1.  History of dementia protein calorie malnutrition.  As noted above dietary has ordered supplementation she is also on Remeron for appetite stimulation diet has been liberalized in diet texture adjustments made-family does not wish for artificial feeding tube-at this point continue supportive care will monitor weights q. weekly per dietary recommendation-also will update a metabolic panel.  She does not appear to be in any distress-I suspect weight loss is largely secondary to progressing dementia at this point will monitor.  2.  History of  dementia at this point appears to be slowly progressive-she does continue on Seroquel 125  mg nightly she is also on Prozac  10 mg daily.  This apparently was started because of scratching behaviors which I believe have improved somewhat.  3.  History of hypotension she is on Florinef 0.1 mg a day-blood pressures appear to be stable 118/75-121/83 most recently  [id number]

## 2019-03-08 ENCOUNTER — Encounter: Payer: Self-pay | Admitting: Internal Medicine

## 2019-03-12 DIAGNOSIS — Z23 Encounter for immunization: Secondary | ICD-10-CM | POA: Diagnosis not present

## 2019-03-18 ENCOUNTER — Non-Acute Institutional Stay (SKILLED_NURSING_FACILITY): Payer: Medicare Other | Admitting: Internal Medicine

## 2019-03-18 ENCOUNTER — Encounter: Payer: Self-pay | Admitting: Internal Medicine

## 2019-03-18 DIAGNOSIS — Z87898 Personal history of other specified conditions: Secondary | ICD-10-CM | POA: Diagnosis not present

## 2019-03-18 DIAGNOSIS — F424 Excoriation (skin-picking) disorder: Secondary | ICD-10-CM | POA: Diagnosis not present

## 2019-03-18 DIAGNOSIS — G301 Alzheimer's disease with late onset: Secondary | ICD-10-CM | POA: Diagnosis not present

## 2019-03-18 DIAGNOSIS — F0281 Dementia in other diseases classified elsewhere with behavioral disturbance: Secondary | ICD-10-CM | POA: Diagnosis not present

## 2019-03-18 DIAGNOSIS — F02818 Dementia in other diseases classified elsewhere, unspecified severity, with other behavioral disturbance: Secondary | ICD-10-CM

## 2019-03-18 NOTE — Progress Notes (Signed)
Location:    [facility] Room Number: 304/D Place of Service:  SNF [phone]) Provider:  Henreitta Leber, MD  Patient Care Team: [name], MD as PCP - General (Internal Medicine) [name], MD (Gastroenterology) [name], NP (Obstetrics and Gynecology)  Extended Emergency Contact Information Primary Emergency Contact: [name],[name] Address: [address], [place] Phone: [phone] Mobile Phone: [phone] Relation: Daughter Secondary Emergency Contact: [place] Phone: [phone] Mobile Phone: [phone] Relation: Daughter  Code Status:  DNR Goals of care: Advanced Directive information Advanced Directives 03/18/2019  Does Patient Have a Medical Advance Directive? Yes  Type of Advance Directive Out of facility DNR (pink MOST or yellow form)  Does patient want to make changes to medical advance directive? No - Patient declined  Copy of [place] in Chart? -  Would patient like information on creating a medical advance directive? -  Pre-existing out of facility DNR order (yellow form or pink MOST form) Yellow form placed in chart (order not valid for inpatient use)     Chief Complaint  Patient presents with  . Follow-up    Skin Picking   With pharmacy concerns about renewing Prozac secondary to cos t ssues  HPI:  Pt is a 86 y.o. female seen today for an acute visit for follow-up of skin scratching.  Patient has a history of significant dementia and apparently has some skin picking  behaviors-she was started on Prozac 10 mg a day for skin picking behaviors.  Per staff this has been  somewhat effective.  However pharmacy says that there are now cost concerns and was wondering if possibly Celexa would be an alternative.  Patient herself has significant dementia cannot really give any review of  systems-she has had some weight loss which is thought to be related to her progressing dementia-dietary has been following this-she is on Remeron also has been on supplements family has expressed desires for no artificial feeding.  Currently she is lying in bed comfortably actually is a bit more verbal than when I last saw her.  Vital signs appear to be stable it appears at times her systolic blood pressure is a little under 100 at times but I do not believe this is unusual and she is bright and alert with significant confusion   Past Medical History:  Diagnosis Date  . Anxiety   . Arterial tortuosity (aorta) 12/11/2012  . Asthma   . Chronic kidney disease, stage IV (severe) ([place]) 12/11/2012  . Chronic mental illness   . CKD (chronic kidney disease), stage III 01/14/2015  . Closed hip fracture requiring operative repair, right, with routine healing, subsequent encounter 12/19/2015  . Colitis   . COPD (chronic obstructive pulmonary disease) ([place])   . Decreased rectal sphincter tone 11/21/2012  . Depression   . Drug-seeking behavior 02/26/2011   Use of multiple md to obtain benzos/narcotics pt must sign contract to receive controlled meds.   Marland Kitchen Dysphagia 01/11/2015  . GERD (gastroesophageal reflux disease)   . Glaucoma 06/26/2011  . Glaucoma of both eyes   . Hearing loss, sensorineural 03/04/2014   [facility]; 02/26/14. There is not a medical or surgical treatment that would benefit her type of hearing loss. We discussed amplification in an overview fashion.    Marland Kitchen History of fall   . Insomnia, unspecified   .  Macular degeneration 06/26/2011  . Migraines   . Nasal bone fracture 03/30/2014  . Osteoporosis   . Osteoporosis 02/26/2011  . Presence of right artificial hip joint   . Protein-calorie malnutrition, severe ([place]) 12/10/2012  . S/P ORIF (open reduction internal fixation) fracture 01/08/2016  . Schatzki's ring 01/14/2015  . Scoliosis   . Substance abuse ([place])    Benbzos and  Narcotics, she does not get any of those medicines from [facility], we have empathetically told her that.  . Vertigo   . Vitamin B deficiency   . Vitamin D deficiency 08/25/2016  . Weight loss    Past Surgical History:  Procedure Laterality Date  . ABDOMINAL HYSTERECTOMY    . CATARACT EXTRACTION    . CHOLECYSTECTOMY    . ESOPHAGOGASTRODUODENOSCOPY (EGD) WITH PROPOFOL N/A 01/13/2015   Procedure: ESOPHAGOGASTRODUODENOSCOPY (EGD) WITH PROPOFOL;  Surgeon: [name], MD;  Location: [facility];  Service: Endoscopy;  Laterality: N/A;  . HIP ARTHROPLASTY Right 12/21/2015   Procedure: ARTHROPLASTY BIPOLAR HIP (HEMIARTHROPLASTY);  Surgeon: [name], MD;  Location: [facility];  Service: Orthopedics;  Laterality: Right;  . PARTIAL HYSTERECTOMY      Allergies  Allergen Reactions  . Amoxicillin     Per daughter  . Ampicillin     Per daughter  . Depakote [Valproic Acid]     DOES NOT WANT HER TO TAKE IT==Makes her hair fall out= per daughter who is poa  . Macrobid [Nitrofurantoin Macrocrystal] Rash    Per MAR  . Penicillins     Per daughter  . Acetaminophen Other (See Comments)    jaundice  . Azithromycin Other (See Comments)    Per MAR  . Butalbital   . Caffeine   . Doxycycline Other (See Comments)    Per MAR  . Escitalopram Oxalate Other (See Comments)    Per MAR  . Fioricet [Butalbital-Apap-Caffeine] Other (See Comments)    Drunk. Per MAR  . Latex Other (See Comments)    Per MAR  . Levofloxacin Other (See Comments)    Per MAR  . Levofloxacin   . Nsaids Other (See Comments)    This is not an allergy. The patient was told by Dr. Rockne Menghini to avoid NSAIDs and stop Vicoprofen in to 2014 to avoid nephrotoxicity.  Per MAR.  Marland Kitchen Oxycodone Other (See Comments)    reaction to synthetic codeine Per MAR  . Restasis [Cyclosporine] Other (See Comments)    Per MAR  . Sulfa Antibiotics Other (See Comments)    Per MAR  . Biaxin [Clarithromycin] Rash    With burning sensation Per [facility]     Outpatient Encounter Medications as of 03/18/2019  Medication Sig  . acetaminophen (TYLENOL) 325 MG tablet Take 650 mg by mouth every 6 (six) hours as needed for headache.  Marland Kitchen acetaminophen (TYLENOL) 500 MG tablet Give 2 tablets ( 1,000 mg total ) by mouth nightly for pain  . albuterol (PROVENTIL HFA;VENTOLIN HFA) 108 (90 BASE) MCG/ACT inhaler Inhale 1 puff into the lungs every 6 (six) hours as needed for shortness of breath.   Marland Kitchen aspirin 81 MG chewable tablet Chew 81 mg by mouth daily.  . Biotin 5000 MCG CAPS Take 1 capsule by mouth daily.   . bisacodyl (DULCOLAX) 10 MG suppository If not relieved by MOM, give 10 mg Bisacodyl suppositiory rectally X 1 dose in 24 hours as needed (Do not use constipation standing orders for residents with renal failure/CFR less than 30. Contact MD for orders) (Physician Order)  .  carboxymethylcellulose 1 % ophthalmic solution Apply 1 drop to eye at bedtime.  . Cholecalciferol (VITAMIN D3) 1.25 MG (50000 UT) CAPS Take 1 capsule by mouth once a week.  . Cranberry 450 MG TABS Take 1 tablet by mouth 2 (two) times daily.   Marland Kitchen docusate sodium (COLACE) 100 MG capsule Take 100 mg by mouth 2 (two) times daily.  . dorzolamide-timolol (COSOPT) 22.3-6.8 MG/ML ophthalmic solution Place 1 drop into both eyes 2 (two) times daily.  Marland Kitchen ENSURE (ENSURE) Take 237 mLs by mouth 4 (four) times daily. D/t weight loss  . ferrous sulfate 325 (65 FE) MG tablet Take 325 mg by mouth daily.   . fludrocortisone (FLORINEF) 0.1 MG tablet Take 0.1 mg by mouth every morning. for orthostatic hypotension  . FLUoxetine (PROZAC) 10 MG tablet Take 10 mg by mouth daily.  Marland Kitchen lactulose (CHRONULAC) 10 GM/15ML solution Take 15 mLs (10 g total) by mouth daily as needed for severe constipation.  Marland Kitchen loratadine (CLARITIN) 10 MG tablet Take 10 mg by mouth daily.   . magnesium hydroxide (MILK OF MAGNESIA) 400 MG/5ML suspension If no BM in 3 days, give 30 cc Milk of Magnesium p.o. x 1 dose in 24 hours as needed (Do not use  standing constipation orders for residents with renal failure CFR less than 30. Contact MD for orders) (Physician Order)  . mirtazapine (REMERON) 15 MG tablet Take 7.5 mg by mouth at bedtime.  . nitroGLYCERIN (NITROSTAT) 0.4 MG SL tablet Place 0.4 mg under the tongue every 5 (five) minutes as needed for chest pain. May use 3 times  . Polyethyl Glycol-Propyl Glycol (SYSTANE) 0.4-0.3 % SOLN Place 2 drops into both eyes every 2 (two) hours as needed (dry eyes).   . polyethylene glycol (MIRALAX / GLYCOLAX) packet Take 17 g by mouth daily.   . potassium chloride (K-DUR) 20 MEQ tablet Take 1 tablet (20 mEq total) by mouth daily.  . QUEtiapine (SEROQUEL) 100 MG tablet Take 100 mg by mouth at bedtime. Take along with a 25 mg tablet to = 125 mg qhs  . QUEtiapine (SEROQUEL) 25 MG tablet Take 25 mg by mouth at bedtime. Take along with a 100 mg tablet to = 125 mg qhs  . senna (SENOKOT) 8.6 MG tablet Take 2 tablets by mouth 2 (two) times daily as needed for constipation.  . Sodium Phosphates (RA SALINE ENEMA RE) If not relieved by Biscodyl suppository, give disposable Saline Enema rectally X 1 dose/24 hrs as needed (Do not use constipation standing orders for residents with renal failure/CFR less than 30. Contact MD for orders)(Physician Or  . travoprost, benzalkonium, (TRAVATAN) 0.004 % ophthalmic solution Place 1 drop into both eyes at bedtime.  . vitamin B-12 (CYANOCOBALAMIN) 1000 MCG tablet Take 1,500 mcg by mouth daily. 1-1/2 tablets to = 1500 mcg   No facility-administered encounter medications on file as of 03/18/2019.    Review of Systems   Is unobtainable secondary to dementia please see HPI  Immunization History  Administered Date(s) Administered  . Influenza Split 11/13/2011  . Influenza-Unspecified 02/13/2008, 11/30/2013, 12/20/2016  . Moderna SARS-COVID-2 Vaccination 02/12/2019, 03/12/2019  . PPD Test 12/15/2015  . Pneumococcal Conjugate-13 10/16/2016  . Pneumococcal-Unspecified 02/13/2008   . Td 02/12/2009   Pertinent  Health Maintenance Due  Topic Date Due  . INFLUENZA VACCINE  05/13/2019 (Originally 09/13/2018)  . DEXA SCAN  Completed  . PNA vac Low Risk Adult  Completed   Fall Risk  08/27/2017 08/22/2016  Falls in the past year? No  Yes  Number falls in past yr: - 2 or more  Injury with Fall? - Yes  Comment - back of head   Functional Status Survey:    Vitals:   03/18/19 1551  BP: (!) 98/58  Pulse: 61  Resp: 18  Temp: (!) 97 F (36.1 C)  TempSrc: Oral  SpO2: 97%  Weight: 70 lb 3.2 oz (31.8 kg)  Height: 4\' 11"  (1.499 m)   Body mass index is 14.18 kg/m. Physical Exam General this is a frail elderly female she is talkative today.  Her skin is warm and dry I do not see any specific scratching her skin picking areas   Eyes visual acuity appears to be intact sclera and conjunctive are clear.  Oropharynx is clear mucous membranes appear fairly moist  Heart is regular rate and rhythm distant heart sounds she does not have significant edema.  Abdomen is soft nontender with positive bowel sounds.  Musculoskeletal she does have stiffness of her lower extremities this appears to be baseline moves her upper extremities at baseline.   Neurologic She is more talkative today could not appreciate lateralizing findings her speech is clear  Psych findings consistent with dementia her speech is clear however and she is cooperative with exam and follows most simple verbal commands  Labs reviewed: Recent Labs    06/06/18 0646 06/06/18 0646 06/07/18 0317 06/07/18 0317 06/08/18 0317 06/08/18 1052 11/22/18 0000 12/06/18 0000 01/28/19 0000  NA 145   < > 144   < > 142   < > 145 144 141  K 2.4*   < > 2.9*   < > 2.4*   < > 5.1 4.9 3.9  CL 111   < > 114*   < > 106  --  107 104 103  CO2 21*   < > 19*   < > 21*  --  26* 26* 28*  GLUCOSE 100*  --  110*  --  89  --   --   --   --   BUN 15   < > 12   < > 11   < > 28* 23* 22*  CREATININE 0.67   < > 0.58   < > 0.70   < >  0.6 0.8 0.7  CALCIUM 8.5*   < > 8.7*   < > 9.0  --   --  9.6 9.5  MG  --   --   --   --  1.6*  --   --   --   --    < > = values in this interval not displayed.   Recent Labs    06/04/18 1121 06/05/18 0320 09/02/18 0000  AST 22 22 17   ALT 16 16 9   ALKPHOS 66 59 98  BILITOT 0.5 0.5  --   PROT 5.9* 5.7*  --   ALBUMIN 2.8* 2.5*  --    Recent Labs    06/06/18 0646 06/06/18 0646 06/07/18 0317 06/07/18 0317 06/08/18 0317 06/23/18 0000 09/02/18 0000 11/22/18 0000 01/28/19 0000  WBC 8.6   < > 7.4   < > 7.6   < > 5.5 8.3 6.1  NEUTROABS  --   --   --   --   --    < > 3 6 4   HGB 11.3*   < > 11.9*   < > 12.8   < > 12.9 12.3 12.0  HCT 34.9*   < > 36.1   < > 38.8   < >  38 37 37  MCV 93.1  --  90.7  --  90.2  --   --   --   --   PLT 167   < > 205   < > 200   < > 224 234 227   < > = values in this interval not displayed.   Lab Results  Component Value Date   TSH 0.59 07/31/2017   Lab Results  Component Value Date   HGBA1C 5.4 06/11/2016   Lab Results  Component Value Date   CHOL 134 07/31/2017   HDL 54 06/11/2016   LDLCALC 61 07/31/2017   TRIG 102 07/31/2017    Significant Diagnostic Results in last 30 days:  No results found.  Assessment/Plan  #1 skin picking behaviors-with history of dementia-I did speak with nursing and there still is approximately at 10 to 14-day supply of Prozac-we will continue Prozac for now-will discuss with Dr. [name] possibility of switching this-- I do note patient  does have an allergy to.  Lexapro-  #2 dementia with weight loss-will order weekly weights-dietary continues to follow this closely also have attempted to update a metabolic panel the patient apparently has been refusing labs at this point nursing will continue to try.  Continue supportive care-she appears to be comfortable and nursing does not report any acute issues   [phone]  Addendum I have spoken with Dr. [name] we will switch her to Celexa 10 mg a day when her  Prozac supply runs out and monitor for any allergic reaction although I suspect the likelihood is low since she did tolerate the Prozac well

## 2019-03-25 ENCOUNTER — Encounter: Payer: Self-pay | Admitting: Internal Medicine

## 2019-03-25 ENCOUNTER — Non-Acute Institutional Stay (SKILLED_NURSING_FACILITY): Payer: Medicare Other | Admitting: Internal Medicine

## 2019-03-25 DIAGNOSIS — E876 Hypokalemia: Secondary | ICD-10-CM | POA: Diagnosis not present

## 2019-03-25 DIAGNOSIS — F02818 Dementia in other diseases classified elsewhere, unspecified severity, with other behavioral disturbance: Secondary | ICD-10-CM

## 2019-03-25 DIAGNOSIS — F319 Bipolar disorder, unspecified: Secondary | ICD-10-CM

## 2019-03-25 DIAGNOSIS — F0281 Dementia in other diseases classified elsewhere with behavioral disturbance: Secondary | ICD-10-CM

## 2019-03-25 DIAGNOSIS — J449 Chronic obstructive pulmonary disease, unspecified: Secondary | ICD-10-CM

## 2019-03-25 DIAGNOSIS — Z87898 Personal history of other specified conditions: Secondary | ICD-10-CM | POA: Diagnosis not present

## 2019-03-25 DIAGNOSIS — G301 Alzheimer's disease with late onset: Secondary | ICD-10-CM | POA: Diagnosis not present

## 2019-03-25 DIAGNOSIS — I951 Orthostatic hypotension: Secondary | ICD-10-CM

## 2019-03-25 NOTE — Progress Notes (Signed)
Location:     [facility] Room Number: 304/D Place of Service:  SNF (31) Provider:  Leda Min, MD  Patient Care Team: [name], MD as PCP - General (Internal Medicine) [name], MD (Gastroenterology) [name], NP (Obstetrics and Gynecology)  Extended Emergency Contact Information Primary Emergency Contact: [name],[name] Address: [address], [place] Phone: [phone] Mobile Phone: [phone] Relation: Daughter Secondary Emergency Contact: [place] Phone: [phone] Mobile Phone: [phone] Relation: Daughter  Code Status:  DNR Goals of care: Advanced Directive information Advanced Directives 03/25/2019  Does Patient Have a Medical Advance Directive? Yes  Type of Advance Directive Out of facility DNR (pink MOST or yellow form)  Does patient want to make changes to medical advance directive? No - Patient declined  Copy of [place] in Chart? -  Would patient like information on creating a medical advance directive? -  Pre-existing out of facility DNR order (yellow form or pink MOST form) Yellow form placed in chart (order not valid for inpatient use)     Chief Complaint  Patient presents with  . Medical Management of Chronic Issues    Routine visit of medical management   Medical management of chronic medical issues including dementia with history of bipolar disorder-history of hypotension-COPD-allergic rhinitis-anemia-weight loss  HPI:  Pt is a 86 y.o. female seen today for medical management of chronic diseases.  As noted above. Patient is a long-term resident of the facility with a history of significant dementia.  This is complicated with a history of bipolar disorder.  However this recently appears to be stable she is on Seroquel 125 mg nightly.  She is also been  started on Prozac for some skin picking behaviors.  Pharmacy has left a note saying that the cost of Prozac is probably going to be prohibitive and suggested Celexa instead.  I did discuss this with Dr. [name] and will switch her to Celexa when the Prozac supply runs out  She also has had some recent weight loss but if recent scales are accurate she appears to have gained some recently-- her weight most recently 76.8 which is comparable to what it was back in December.  It had been down to 68.1 at 1 point again there appears to be some scale variability.  She is on supplements as well as Remeron for appetite stimulation-and family in the past as expressed desires for no artificial feeding tube.  We have triedto obtain a metabolic panel but patient has been somewhat resistant to labs but tells me today she will allow it at some point  She does not have any complaints today.  She appears to be doing pretty well with supportive care.        Past Medical History:  Diagnosis Date  . Anxiety   . Arterial tortuosity (aorta) 12/11/2012  . Asthma   . Chronic kidney disease, stage IV (severe) ([place]) 12/11/2012  . Chronic mental illness   . CKD (chronic kidney disease), stage III 01/14/2015  . Closed hip fracture requiring operative repair, right, with routine healing, subsequent encounter 12/19/2015  . Colitis   . COPD (chronic obstructive pulmonary disease) ([place])   . Decreased rectal sphincter tone 11/21/2012  . Depression   . Drug-seeking behavior 02/26/2011   Use of multiple md to obtain benzos/narcotics pt must sign  contract to receive controlled meds.   Marland Kitchen Dysphagia 01/11/2015  . GERD (gastroesophageal reflux disease)   . Glaucoma 06/26/2011  . Glaucoma of both eyes   . Hearing loss, sensorineural 03/04/2014   [facility]; 02/26/14. There is not a medical or surgical treatment that would benefit her type of hearing loss. We discussed amplification in an overview fashion.    Marland Kitchen  History of fall   . Insomnia, unspecified   . Macular degeneration 06/26/2011  . Migraines   . Nasal bone fracture 03/30/2014  . Osteoporosis   . Osteoporosis 02/26/2011  . Presence of right artificial hip joint   . Protein-calorie malnutrition, severe ([place]) 12/10/2012  . S/P ORIF (open reduction internal fixation) fracture 01/08/2016  . Schatzki's ring 01/14/2015  . Scoliosis   . Substance abuse ([place])    Benbzos and Narcotics, she does not get any of those medicines from [facility], we have empathetically told her that.  . Vertigo   . Vitamin B deficiency   . Vitamin D deficiency 08/25/2016  . Weight loss    Past Surgical History:  Procedure Laterality Date  . ABDOMINAL HYSTERECTOMY    . CATARACT EXTRACTION    . CHOLECYSTECTOMY    . ESOPHAGOGASTRODUODENOSCOPY (EGD) WITH PROPOFOL N/A 01/13/2015   Procedure: ESOPHAGOGASTRODUODENOSCOPY (EGD) WITH PROPOFOL;  Surgeon: [name], MD;  Location: [facility];  Service: Endoscopy;  Laterality: N/A;  . HIP ARTHROPLASTY Right 12/21/2015   Procedure: ARTHROPLASTY BIPOLAR HIP (HEMIARTHROPLASTY);  Surgeon: [name], MD;  Location: [facility];  Service: Orthopedics;  Laterality: Right;  . PARTIAL HYSTERECTOMY      Allergies  Allergen Reactions  . Amoxicillin     Per daughter  . Ampicillin     Per daughter  . Depakote [Valproic Acid]     DOES NOT WANT HER TO TAKE IT==Makes her hair fall out= per daughter who is poa  . Macrobid [Nitrofurantoin Macrocrystal] Rash    Per MAR  . Penicillins     Per daughter  . Acetaminophen Other (See Comments)    jaundice  . Azithromycin Other (See Comments)    Per MAR  . Butalbital   . Caffeine   . Doxycycline Other (See Comments)    Per MAR  . Escitalopram Oxalate Other (See Comments)    Per MAR  . Fioricet [Butalbital-Apap-Caffeine] Other (See Comments)    Drunk. Per MAR  . Latex Other (See Comments)    Per MAR  . Levofloxacin Other (See Comments)    Per MAR  . Levofloxacin   . Nsaids  Other (See Comments)    This is not an allergy. The patient was told by Dr. Rockne Menghini to avoid NSAIDs and stop Vicoprofen in to 2014 to avoid nephrotoxicity.  Per MAR.  Marland Kitchen Oxycodone Other (See Comments)    reaction to synthetic codeine Per MAR  . Restasis [Cyclosporine] Other (See Comments)    Per MAR  . Sulfa Antibiotics Other (See Comments)    Per MAR  . Biaxin [Clarithromycin] Rash    With burning sensation Per MAR    Allergies as of 03/25/2019      Reactions   Amoxicillin    Per daughter   Ampicillin    Per daughter   Depakote [valproic Acid]    DOES NOT WANT HER TO TAKE IT==Makes her hair fall out= per daughter who is poa   Macrobid [nitrofurantoin Macrocrystal] Rash   Per MAR   Penicillins    Per daughter   Acetaminophen Other (See  Comments)   jaundice   Azithromycin Other (See Comments)   Per MAR   Butalbital    Caffeine    Doxycycline Other (See Comments)   Per MAR   Escitalopram Oxalate Other (See Comments)   Per MAR   Fioricet [butalbital-apap-caffeine] Other (See Comments)   Drunk. Per MAR   Latex Other (See Comments)   Per MAR   Levofloxacin Other (See Comments)   Per MAR   Levofloxacin    Nsaids Other (See Comments)   This is not an allergy. The patient was told by Dr. Rockne Menghini to avoid NSAIDs and stop Vicoprofen in to 2014 to avoid nephrotoxicity. Per MAR.   Oxycodone Other (See Comments)   reaction to synthetic codeine Per MAR   Restasis [cyclosporine] Other (See Comments)   Per MAR   Sulfa Antibiotics Other (See Comments)   Per MAR   Biaxin [clarithromycin] Rash   With burning sensation Per [facility]      Medication List       Accurate as of March 25, 2019 11:49 AM. If you have any questions, ask your nurse or doctor.        acetaminophen 325 MG tablet Commonly known as: TYLENOL Take 650 mg by mouth every 6 (six) hours as needed for headache.   acetaminophen 500 MG tablet Commonly known as: TYLENOL Give 2 tablets ( 1,000 mg total ) by mouth  nightly for pain   albuterol 108 (90 Base) MCG/ACT inhaler Commonly known as: VENTOLIN HFA Inhale 1 puff into the lungs every 6 (six) hours as needed for shortness of breath.   aspirin 81 MG chewable tablet Chew 81 mg by mouth daily.   Biotin 5000 MCG Caps Take 1 capsule by mouth daily.   bisacodyl 10 MG suppository Commonly known as: DULCOLAX If not relieved by MOM, give 10 mg Bisacodyl suppositiory rectally X 1 dose in 24 hours as needed (Do not use constipation standing orders for residents with renal failure/CFR less than 30. Contact MD for orders) (Physician Order)   carboxymethylcellulose 1 % ophthalmic solution Place 1 drop into both eyes at bedtime.   Claritin 10 MG tablet Generic drug: loratadine Take 10 mg by mouth daily.   Cranberry 450 MG Tabs Take 1 tablet by mouth 2 (two) times daily.   docusate sodium 100 MG capsule Commonly known as: COLACE Take 100 mg by mouth 2 (two) times daily.   dorzolamide-timolol 22.3-6.8 MG/ML ophthalmic solution Commonly known as: COSOPT Place 1 drop into both eyes 2 (two) times daily.   Ensure Take 237 mLs by mouth 4 (four) times daily. D/t weight loss   ferrous sulfate 325 (65 FE) MG tablet Take 325 mg by mouth daily.   fludrocortisone 0.1 MG tablet Commonly known as: FLORINEF Take 0.1 mg by mouth every morning. for orthostatic hypotension   FLUoxetine 10 MG tablet Commonly known as: PROZAC Take 10 mg by mouth daily.   lactulose 10 GM/15ML solution Commonly known as: CHRONULAC Take 15 mLs (10 g total) by mouth daily as needed for severe constipation.   magnesium hydroxide 400 MG/5ML suspension Commonly known as: MILK OF MAGNESIA If no BM in 3 days, give 30 cc Milk of Magnesium p.o. x 1 dose in 24 hours as needed (Do not use standing constipation orders for residents with renal failure CFR less than 30. Contact MD for orders) (Physician Order)   mirtazapine 15 MG tablet Commonly known as: REMERON Take 7.5 mg by mouth  at bedtime.   nitroGLYCERIN 0.4  MG SL tablet Commonly known as: NITROSTAT Place 0.4 mg under the tongue every 5 (five) minutes as needed for chest pain. May use 3 times   polyethylene glycol 17 g packet Commonly known as: MIRALAX / GLYCOLAX Take 17 g by mouth daily.   potassium chloride SA 20 MEQ tablet Commonly known as: KLOR-CON Take 1 tablet (20 mEq total) by mouth daily.   QUEtiapine 100 MG tablet Commonly known as: SEROQUEL Take 100 mg by mouth at bedtime. Take along with a 25 mg tablet to = 125 mg qhs   QUEtiapine 25 MG tablet Commonly known as: SEROQUEL Take 25 mg by mouth at bedtime. Take along with a 100 mg tablet to = 125 mg qhs   RA SALINE ENEMA RE If not relieved by Biscodyl suppository, give disposable Saline Enema rectally X 1 dose/24 hrs as needed (Do not use constipation standing orders for residents with renal failure/CFR less than 30. Contact MD for orders)(Physician Or   senna 8.6 MG tablet Commonly known as: SENOKOT Take 2 tablets by mouth 2 (two) times daily as needed for constipation.   Systane 0.4-0.3 % Soln Generic drug: Polyethyl Glycol-Propyl Glycol Place 2 drops into both eyes every 2 (two) hours as needed (dry eyes).   travoprost (benzalkonium) 0.004 % ophthalmic solution Commonly known as: TRAVATAN Place 1 drop into both eyes at bedtime.   vitamin B-12 1000 MCG tablet Commonly known as: CYANOCOBALAMIN Take 1,500 mcg by mouth daily. 1-1/2 tablets to = 1500 mcg   Vitamin D3 1.25 MG (50000 UT) Caps Take 1 capsule by mouth once a week.       Review of Systems   Is largely unobtainable secondary to dementia when asked she says she is not having any pain or discomfort.  Nursing does not report any recent acute issues  Immunization History  Administered Date(s) Administered  . Influenza Split 11/13/2011  . Influenza-Unspecified 02/13/2008, 11/30/2013, 12/20/2016  . Moderna SARS-COVID-2 Vaccination 02/12/2019, 03/12/2019  . PPD Test  12/15/2015  . Pneumococcal Conjugate-13 10/16/2016  . Pneumococcal-Unspecified 02/13/2008  . Td 02/12/2009   Pertinent  Health Maintenance Due  Topic Date Due  . INFLUENZA VACCINE  05/13/2019 (Originally 09/13/2018)  . DEXA SCAN  Completed  . PNA vac Low Risk Adult  Completed   Fall Risk  08/27/2017 08/22/2016  Falls in the past year? No Yes  Number falls in past yr: - 2 or more  Injury with Fall? - Yes  Comment - back of head   Functional Status Survey:    Vitals:   03/25/19 1138  BP: 129/88  Pulse: 70  Resp: 18  Temp: (!) 97.5 F (36.4 C)  TempSrc: Oral  SpO2: 97%  Weight: 76 lb 12.8 oz (34.8 kg)  Height: 4\' 11"  (1.499 m)   Body mass index is 15.51 kg/m. Physical Exam   In general this is a somewhat frail elderly female in no distress she is alert today and talking some.  Her skin is warm and dry.  Eyes appears to be at baseline does have a history of legal blindness--sclera and conjunctive are clear  Oropharynx is clear mucous membranes appear fairly moist.  Chest is clear to auscultation there is no labored breathing.  Heart is regular rate and rhythm with somewhat distant heart sounds she does not have significant lower extremity edema.  Abdomen is soft nontender with positive bowel sounds.  Musculoskeletal Limited exam since she is in bed but appears able to move all extremities x4 with some  general frailty.  She does have arthritic changes most prominently of her hands.  Neurologic appears grossly intact she is alert her speech is clear.  Psych she is oriented to self does follow simple verbal commands at times speaks a bit more than others and today she is speaking.    Labs reviewed: Recent Labs    06/06/18 0646 06/06/18 0646 06/07/18 0317 06/07/18 0317 06/08/18 0317 06/08/18 1052 11/22/18 0000 12/06/18 0000 01/28/19 0000  NA 145   < > 144   < > 142   < > 145 144 141  K 2.4*   < > 2.9*   < > 2.4*   < > 5.1 4.9 3.9  CL 111   < > 114*   < >  106  --  107 104 103  CO2 21*   < > 19*   < > 21*  --  26* 26* 28*  GLUCOSE 100*  --  110*  --  89  --   --   --   --   BUN 15   < > 12   < > 11   < > 28* 23* 22*  CREATININE 0.67   < > 0.58   < > 0.70   < > 0.6 0.8 0.7  CALCIUM 8.5*   < > 8.7*   < > 9.0  --   --  9.6 9.5  MG  --   --   --   --  1.6*  --   --   --   --    < > = values in this interval not displayed.   Recent Labs    06/04/18 1121 06/05/18 0320 09/02/18 0000  AST 22 22 17   ALT 16 16 9   ALKPHOS 66 59 98  BILITOT 0.5 0.5  --   PROT 5.9* 5.7*  --   ALBUMIN 2.8* 2.5*  --    Recent Labs    06/06/18 0646 06/06/18 0646 06/07/18 0317 06/07/18 0317 06/08/18 0317 06/23/18 0000 09/02/18 0000 11/22/18 0000 01/28/19 0000  WBC 8.6   < > 7.4   < > 7.6   < > 5.5 8.3 6.1  NEUTROABS  --   --   --   --   --    < > 3 6 4   HGB 11.3*   < > 11.9*   < > 12.8   < > 12.9 12.3 12.0  HCT 34.9*   < > 36.1   < > 38.8   < > 38 37 37  MCV 93.1  --  90.7  --  90.2  --   --   --   --   PLT 167   < > 205   < > 200   < > 224 234 227   < > = values in this interval not displayed.   Lab Results  Component Value Date   TSH 0.59 07/31/2017   Lab Results  Component Value Date   HGBA1C 5.4 06/11/2016   Lab Results  Component Value Date   CHOL 134 07/31/2017   HDL 54 06/11/2016   LDLCALC 61 07/31/2017   TRIG 102 07/31/2017    Significant Diagnostic Results in last 30 days:  No results found.  Assessment/Plan  History of dementia with bipolar disorder-this appears to be stable nursing does not report recent behaviors she is on Seroquel at 125 mg at night she is also on Prozac 10 mg a day for skin picking behaviors.  As noted above when supply runs out this will be switched to Celexa secondary to pharmacy cost issues.  She does have some history of recent weight loss although appears to be rebounding-although again there appears to be some scale variability-she is on supplements she is followed by dietary at this point will  monitor.\ She does continue on Remeron for appetite stimulation  We are trying to obtain labs including a metabolic panel the patient has been somewhat resistant but says she will allow it now.  .  2.  History of hypotension she continues on Florinef in the morning-blood pressures appear to be stable recently 129/88-119/75-118/74-the lowest I see is 96/66 by does not appear she has systolics in the 83F that often.  3.  History of COPD she is on albuterol as needed and do not believe this has been an issue recently.  4.  History of allergic rhinitis she continues on Claritin this appears to be well-tolerated.  5.  History of anemia hemoglobin was 12.0 back in December will monitor periodically.  She is on iron  6.  History of hypokalemia she is on supplementation again will try to obtain an updated potassium level when patient will allow.  Last potassium was normal at 3.9 back in mid December.   [id number]

## 2019-03-27 ENCOUNTER — Encounter: Payer: Self-pay | Admitting: Internal Medicine

## 2019-03-27 DIAGNOSIS — D649 Anemia, unspecified: Secondary | ICD-10-CM | POA: Diagnosis not present

## 2019-03-27 DIAGNOSIS — I10 Essential (primary) hypertension: Secondary | ICD-10-CM | POA: Diagnosis not present

## 2019-03-27 LAB — COMPREHENSIVE METABOLIC PANEL
Albumin: 3.7 (ref 3.5–5.0)
Calcium: 10.1 (ref 8.7–10.7)
GFR calc Af Amer: 83.9
GFR calc non Af Amer: 72.39
Globulin: 2.6

## 2019-03-27 LAB — CBC AND DIFFERENTIAL
HCT: 39 (ref 36–46)
Hemoglobin: 13.1 (ref 12.0–16.0)
Neutrophils Absolute: 3
Platelets: 218 (ref 150–399)
WBC: 6.4

## 2019-03-27 LAB — BASIC METABOLIC PANEL WITH GFR
BUN: 28 — AB (ref 4–21)
CO2: 26 — AB (ref 13–22)
Chloride: 105 (ref 99–108)
Creatinine: 0.7 (ref 0.5–1.1)
Glucose: 92
Potassium: 5 (ref 3.4–5.3)
Sodium: 141 (ref 137–147)

## 2019-03-27 LAB — CBC: RBC: 4.34 (ref 3.87–5.11)

## 2019-03-27 LAB — HEPATIC FUNCTION PANEL
ALT: 10 (ref 7–35)
AST: 17 (ref 13–35)
Alkaline Phosphatase: 93 (ref 25–125)
Bilirubin, Total: 0.2

## 2019-04-21 DIAGNOSIS — L853 Xerosis cutis: Secondary | ICD-10-CM | POA: Diagnosis not present

## 2019-04-21 DIAGNOSIS — L602 Onychogryphosis: Secondary | ICD-10-CM | POA: Diagnosis not present

## 2019-04-21 DIAGNOSIS — I739 Peripheral vascular disease, unspecified: Secondary | ICD-10-CM | POA: Diagnosis not present

## 2019-04-22 DIAGNOSIS — E559 Vitamin D deficiency, unspecified: Secondary | ICD-10-CM | POA: Diagnosis not present

## 2019-04-22 DIAGNOSIS — I10 Essential (primary) hypertension: Secondary | ICD-10-CM | POA: Diagnosis not present

## 2019-04-22 LAB — VITAMIN D 25 HYDROXY (VIT D DEFICIENCY, FRACTURES): Vit D, 25-Hydroxy: 60

## 2019-04-27 ENCOUNTER — Non-Acute Institutional Stay (SKILLED_NURSING_FACILITY): Payer: Medicare Other | Admitting: Internal Medicine

## 2019-04-27 ENCOUNTER — Encounter: Payer: Self-pay | Admitting: Internal Medicine

## 2019-04-27 DIAGNOSIS — I951 Orthostatic hypotension: Secondary | ICD-10-CM

## 2019-04-27 DIAGNOSIS — G301 Alzheimer's disease with late onset: Secondary | ICD-10-CM | POA: Diagnosis not present

## 2019-04-27 DIAGNOSIS — H401133 Primary open-angle glaucoma, bilateral, severe stage: Secondary | ICD-10-CM

## 2019-04-27 DIAGNOSIS — J449 Chronic obstructive pulmonary disease, unspecified: Secondary | ICD-10-CM

## 2019-04-27 DIAGNOSIS — J069 Acute upper respiratory infection, unspecified: Secondary | ICD-10-CM | POA: Diagnosis not present

## 2019-04-27 DIAGNOSIS — E876 Hypokalemia: Secondary | ICD-10-CM | POA: Diagnosis not present

## 2019-04-27 DIAGNOSIS — F0281 Dementia in other diseases classified elsewhere with behavioral disturbance: Secondary | ICD-10-CM

## 2019-04-27 DIAGNOSIS — F02818 Dementia in other diseases classified elsewhere, unspecified severity, with other behavioral disturbance: Secondary | ICD-10-CM

## 2019-04-27 NOTE — Progress Notes (Signed)
Location:    [facility] Room Number: 304/D Place of Service:  SNF (31) Provider:  Leda Min, MD  Patient Care Team: [name], MD as PCP - General (Internal Medicine) [name], MD (Gastroenterology) [name], NP (Obstetrics and Gynecology)  Extended Emergency Contact Information Primary Emergency Contact: [name],[name] Address: [place], [place] Phone: [phone] Mobile Phone: [phone] Relation: Daughter Secondary Emergency Contact: [place] Phone: [phone] Mobile Phone: [phone] Relation: Daughter  Code Status:  DNR Goals of care: Advanced Directive information Advanced Directives 04/27/2019  Does Patient Have a Medical Advance Directive? Yes  Type of Advance Directive Out of facility DNR (pink MOST or yellow form)  Does patient want to make changes to medical advance directive? No - Patient declined  Copy of [place] in Chart? -  Would patient like information on creating a medical advance directive? -  Pre-existing out of facility DNR order (yellow form or pink MOST form) Yellow form placed in chart (order not valid for inpatient use)     Chief Complaint  Patient presents with  . Medical Management of Chronic Issues    Routine visit of medical mangement  Medical management of chronic medical conditions including history of dementia/bipolar disorder-COPD-hypertension-allergic rhinitis-anemia-weight loss.    HPI:  Pt is a 86 y.o. female seen today for medical management of chronic diseases.  As noted above.  She recently had some weight loss-and she is now on Ensure she is also on Remeron for appetite stimulation-last 3 weeks this appears to have stabilized but will have to be watched.  She also has a history of skin picking behaviors and per pharmacy consult  apparently Prozac cost was going to be prohibitive and suggestion was for Celexa which she has been started on-apparently she is tolerating this pretty well.  Her dementia continues to be significant complicated with bipolar disorder-at times obtaining labs has been difficult secondary to patient refusal but today she says she will allow a lab draw.  She does continue on Seroquel 125 mg a day.    Would like to get a metabolic panel to keep an eye on her electrolytes and kidney function.  Currently she is lying in bed comfortably-she is more talkative than the last visit.  She is pleasant and cooperative   Past Medical History:  Diagnosis Date  . Anxiety   . Arterial tortuosity (aorta) 12/11/2012  . Asthma   . Chronic kidney disease, stage IV (severe) ([place]) 12/11/2012  . Chronic mental illness   . CKD (chronic kidney disease), stage III 01/14/2015  . Closed hip fracture requiring operative repair, right, with routine healing, subsequent encounter 12/19/2015  . Colitis   . COPD (chronic obstructive pulmonary disease) ([place])   . Decreased rectal sphincter tone 11/21/2012  . Depression   . Drug-seeking behavior 02/26/2011   Use of multiple md to obtain benzos/narcotics pt must sign contract to receive controlled meds.   Marland Kitchen Dysphagia 01/11/2015  . GERD (gastroesophageal reflux disease)   . Glaucoma 06/26/2011  . Glaucoma of both eyes   . Hearing loss, sensorineural 03/04/2014   [facility]; 02/26/14. There is not a medical or surgical treatment that would benefit her type of hearing loss. We discussed amplification in an overview fashion.    Marland Kitchen History of fall   . Insomnia, unspecified   .  Macular degeneration 06/26/2011  . Migraines   . Nasal bone fracture 03/30/2014  . Osteoporosis   . Osteoporosis 02/26/2011  . Presence of right artificial hip joint   . Protein-calorie malnutrition, severe ([place]) 12/10/2012  . S/P ORIF (open reduction internal fixation) fracture 01/08/2016  .  Schatzki's ring 01/14/2015  . Scoliosis   . Substance abuse ([place])    Benbzos and Narcotics, she does not get any of those medicines from [facility], we have empathetically told her that.  . Vertigo   . Vitamin B deficiency   . Vitamin D deficiency 08/25/2016  . Weight loss    Past Surgical History:  Procedure Laterality Date  . ABDOMINAL HYSTERECTOMY    . CATARACT EXTRACTION    . CHOLECYSTECTOMY    . ESOPHAGOGASTRODUODENOSCOPY (EGD) WITH PROPOFOL N/A 01/13/2015   Procedure: ESOPHAGOGASTRODUODENOSCOPY (EGD) WITH PROPOFOL;  Surgeon: [name], MD;  Location: [facility];  Service: Endoscopy;  Laterality: N/A;  . HIP ARTHROPLASTY Right 12/21/2015   Procedure: ARTHROPLASTY BIPOLAR HIP (HEMIARTHROPLASTY);  Surgeon: [name], MD;  Location: [facility];  Service: Orthopedics;  Laterality: Right;  . PARTIAL HYSTERECTOMY      Allergies  Allergen Reactions  . Amoxicillin     Per daughter  . Ampicillin     Per daughter  . Depakote [Valproic Acid]     DOES NOT WANT HER TO TAKE IT==Makes her hair fall out= per daughter who is poa  . Macrobid [Nitrofurantoin Macrocrystal] Rash    Per MAR  . Penicillins     Per daughter  . Acetaminophen Other (See Comments)    jaundice  . Azithromycin Other (See Comments)    Per MAR  . Butalbital   . Caffeine   . Doxycycline Other (See Comments)    Per MAR  . Escitalopram Oxalate Other (See Comments)    Per MAR  . Fioricet [Butalbital-Apap-Caffeine] Other (See Comments)    Drunk. Per MAR  . Latex Other (See Comments)    Per MAR  . Levofloxacin Other (See Comments)    Per MAR  . Levofloxacin   . Nsaids Other (See Comments)    This is not an allergy. The patient was told by Dr. Rockne Menghini to avoid NSAIDs and stop Vicoprofen in to 2014 to avoid nephrotoxicity.  Per MAR.  Marland Kitchen Oxycodone Other (See Comments)    reaction to synthetic codeine Per MAR  . Restasis [Cyclosporine] Other (See Comments)    Per MAR  . Sulfa Antibiotics Other (See Comments)     Per MAR  . Biaxin [Clarithromycin] Rash    With burning sensation Per MAR    Allergies as of 04/27/2019      Reactions   Amoxicillin    Per daughter   Ampicillin    Per daughter   Depakote [valproic Acid]    DOES NOT WANT HER TO TAKE IT==Makes her hair fall out= per daughter who is poa   Macrobid [nitrofurantoin Macrocrystal] Rash   Per MAR   Penicillins    Per daughter   Acetaminophen Other (See Comments)   jaundice   Azithromycin Other (See Comments)   Per MAR   Butalbital    Caffeine    Doxycycline Other (See Comments)   Per MAR   Escitalopram Oxalate Other (See Comments)   Per MAR   Fioricet [butalbital-apap-caffeine] Other (See Comments)   Drunk. Per MAR   Latex Other (See Comments)   Per MAR   Levofloxacin Other (See Comments)   Per [facility]  Levofloxacin    Nsaids Other (See Comments)   This is not an allergy. The patient was told by Dr. Rockne Menghini to avoid NSAIDs and stop Vicoprofen in to 2014 to avoid nephrotoxicity. Per MAR.   Oxycodone Other (See Comments)   reaction to synthetic codeine Per MAR   Restasis [cyclosporine] Other (See Comments)   Per MAR   Sulfa Antibiotics Other (See Comments)   Per MAR   Biaxin [clarithromycin] Rash   With burning sensation Per [facility]      Medication List       Accurate as of April 27, 2019  4:48 PM. If you have any questions, ask your nurse or doctor.        acetaminophen 325 MG tablet Commonly known as: TYLENOL Take 650 mg by mouth every 6 (six) hours as needed for headache.   acetaminophen 500 MG tablet Commonly known as: TYLENOL Give 2 tablets ( 1,000 mg total ) by mouth nightly for pain   albuterol 108 (90 Base) MCG/ACT inhaler Commonly known as: VENTOLIN HFA Inhale 1 puff into the lungs every 6 (six) hours as needed for shortness of breath.   ammonium lactate 12 % lotion Commonly known as: LAC-HYDRIN APPLY TO BILAT FEET NIGHTLY X 6 WEEKS FOR DRY SKIN   aspirin 81 MG chewable tablet Chew 81 mg by mouth  daily.   Biotin 5000 MCG Caps Take 1 capsule by mouth daily.   bisacodyl 10 MG suppository Commonly known as: DULCOLAX If not relieved by MOM, give 10 mg Bisacodyl suppositiory rectally X 1 dose in 24 hours as needed (Do not use constipation standing orders for residents with renal failure/CFR less than 30. Contact MD for orders) (Physician Order)   carboxymethylcellulose 1 % ophthalmic solution Place 1 drop into both eyes at bedtime.   Claritin 10 MG tablet Generic drug: loratadine Take 10 mg by mouth daily.   Cranberry 450 MG Tabs Take 1 tablet by mouth 2 (two) times daily.   docusate sodium 100 MG capsule Commonly known as: COLACE Take 100 mg by mouth 2 (two) times daily.   dorzolamide-timolol 22.3-6.8 MG/ML ophthalmic solution Commonly known as: COSOPT Place 1 drop into both eyes 2 (two) times daily.   Ensure Take 237 mLs by mouth 4 (four) times daily. D/t weight loss   ferrous sulfate 325 (65 FE) MG tablet Take 325 mg by mouth daily.   fludrocortisone 0.1 MG tablet Commonly known as: FLORINEF Take 0.1 mg by mouth every morning. for orthostatic hypotension   FLUoxetine 10 MG tablet Commonly known as: PROZAC Take 10 mg by mouth daily.   lactulose 10 GM/15ML solution Commonly known as: CHRONULAC Take 15 mLs (10 g total) by mouth daily as needed for severe constipation.   magnesium hydroxide 400 MG/5ML suspension Commonly known as: MILK OF MAGNESIA If no BM in 3 days, give 30 cc Milk of Magnesium p.o. x 1 dose in 24 hours as needed (Do not use standing constipation orders for residents with renal failure CFR less than 30. Contact MD for orders) (Physician Order)   mirtazapine 15 MG tablet Commonly known as: REMERON Take 7.5 mg by mouth at bedtime.   nitroGLYCERIN 0.4 MG SL tablet Commonly known as: NITROSTAT Place 0.4 mg under the tongue every 5 (five) minutes as needed for chest pain. May use 3 times   polyethylene glycol 17 g packet Commonly known as:  MIRALAX / GLYCOLAX Take 17 g by mouth daily.   potassium chloride SA 20 MEQ tablet Commonly  known as: KLOR-CON Take 1 tablet (20 mEq total) by mouth daily.   QUEtiapine 100 MG tablet Commonly known as: SEROQUEL Take 100 mg by mouth at bedtime. Take along with a 25 mg tablet to = 125 mg qhs   QUEtiapine 25 MG tablet Commonly known as: SEROQUEL Take 25 mg by mouth at bedtime. Take along with a 100 mg tablet to = 125 mg qhs   RA SALINE ENEMA RE If not relieved by Biscodyl suppository, give disposable Saline Enema rectally X 1 dose/24 hrs as needed (Do not use constipation standing orders for residents with renal failure/CFR less than 30. Contact MD for orders)(Physician Or   senna 8.6 MG tablet Commonly known as: SENOKOT Take 2 tablets by mouth 2 (two) times daily as needed for constipation.   Systane 0.4-0.3 % Soln Generic drug: Polyethyl Glycol-Propyl Glycol Place 2 drops into both eyes every 2 (two) hours as needed (dry eyes).   travoprost (benzalkonium) 0.004 % ophthalmic solution Commonly known as: TRAVATAN Place 1 drop into both eyes at bedtime.   vitamin B-12 1000 MCG tablet Commonly known as: CYANOCOBALAMIN Take 1,500 mcg by mouth daily. 1-1/2 tablets to = 1500 mcg   Vitamin D3 1.25 MG (50000 UT) Caps Take 1 capsule by mouth once a week.       Review of Systems   Is quite limited secondary to dementia.  She does not really complain of any pain or discomfort.  Her main concern is she wants to make sure her drink gets put back in the refrigerator  Immunization History  Administered Date(s) Administered  . Influenza Split 11/13/2011  . Influenza-Unspecified 02/13/2008, 11/30/2013, 12/20/2016  . Moderna SARS-COVID-2 Vaccination 02/12/2019, 03/12/2019  . PPD Test 12/15/2015  . Pneumococcal Conjugate-13 10/16/2016  . Pneumococcal-Unspecified 02/13/2008  . Td 02/12/2009   Pertinent  Health Maintenance Due  Topic Date Due  . INFLUENZA VACCINE  05/13/2019  (Originally 09/13/2018)  . DEXA SCAN  Completed  . PNA vac Low Risk Adult  Completed   Fall Risk  08/27/2017 08/22/2016  Falls in the past year? No Yes  Number falls in past yr: - 2 or more  Injury with Fall? - Yes  Comment - back of head   Functional Status Survey:    Vitals:   04/27/19 1628  BP: (!) 101/54  Pulse: (!) 55  Resp: 17  Temp: (!) 97.1 F (36.2 C)  TempSrc: Oral  SpO2: 96%  Weight: 66 lb 4.8 oz (30.1 kg)  Height: 4\' 11"  (1.499 m)   Body mass index is 13.39 kg/m. Physical Exam In general this is a frail-appearing elderly female in no distress lying comfortably in bed she is coughing quite a bit today.  Her skin is warm and dry.  Eyes visual acuity appears to be intact she is legally blind sclera conjunctive appear to be clear-she does have some slight erythema around her orbital areas apparently she does rub her eyes some.  Per nursing she does have eyedrops but often refuses these.  Oropharynx is clear mucous membranes appear fairly moist.  Chest is clear to auscultation there is no labored breathing.  Heart is regular rate and rhythm without murmur gallop or rub she does not have significant lower extremity edema.  Abdomen is soft nontender with positive bowel sounds.  Musculoskeletal appears to be at baseline moves her upper extremities at baseline with general frailty she does have some stiffness of her lower extremities which is baseline.  Neurologic appears grossly intact she is  alert her speech continues to be clear.  Psych she is oriented to self she does follow simple verbal commands she is not agitated with exam.   Labs reviewed: April 22, 2019.  Vitamin D level is greater than 60.  March 27, 2019.  Sodium 141 potassium 5 BUN 27.7 creatinine 0.74   Recent Labs    06/06/18 0646 06/06/18 0646 06/07/18 0317 06/07/18 0317 06/08/18 0317 06/08/18 1052 11/22/18 0000 12/06/18 0000 01/28/19 0000  NA 145   < > 144   < > 142   < > 145  144 141  K 2.4*   < > 2.9*   < > 2.4*   < > 5.1 4.9 3.9  CL 111   < > 114*   < > 106  --  107 104 103  CO2 21*   < > 19*   < > 21*  --  26* 26* 28*  GLUCOSE 100*  --  110*  --  89  --   --   --   --   BUN 15   < > 12   < > 11   < > 28* 23* 22*  CREATININE 0.67   < > 0.58   < > 0.70   < > 0.6 0.8 0.7  CALCIUM 8.5*   < > 8.7*   < > 9.0  --   --  9.6 9.5  MG  --   --   --   --  1.6*  --   --   --   --    < > = values in this interval not displayed.   Recent Labs    06/04/18 1121 06/05/18 0320 09/02/18 0000  AST 22 22 17   ALT 16 16 9   ALKPHOS 66 59 98  BILITOT 0.5 0.5  --   PROT 5.9* 5.7*  --   ALBUMIN 2.8* 2.5*  --    Recent Labs    06/06/18 0646 06/06/18 0646 06/07/18 0317 06/07/18 0317 06/08/18 0317 06/23/18 0000 09/02/18 0000 11/22/18 0000 01/28/19 0000  WBC 8.6   < > 7.4   < > 7.6   < > 5.5 8.3 6.1  NEUTROABS  --   --   --   --   --    < > 3 6 4   HGB 11.3*   < > 11.9*   < > 12.8   < > 12.9 12.3 12.0  HCT 34.9*   < > 36.1   < > 38.8   < > 38 37 37  MCV 93.1  --  90.7  --  90.2  --   --   --   --   PLT 167   < > 205   < > 200   < > 224 234 227   < > = values in this interval not displayed.   Lab Results  Component Value Date   TSH 0.59 07/31/2017   Lab Results  Component Value Date   HGBA1C 5.4 06/11/2016   Lab Results  Component Value Date   CHOL 134 07/31/2017   HDL 54 06/11/2016   LDLCALC 61 07/31/2017   TRIG 102 07/31/2017    Significant Diagnostic Results in last 30 days:  No results found.  Assessment/Plan   #1 history of dementia with bipolar disorder-per nursing staff this is stable she does at times refuse meds including her eyedrops.  She continues on Seroquel 125 mg nightly.  She is also now on Celexa  for skin picking behaviors.  It appears this is stabilized.  In regards to weight loss this appears stabilized the last 3 weeks or so she is on Ensure supplementation as well as Remeron for appetite stimulation.  At this point will  monitor and will try to update labs if patient will allow she says she will today-.  2.  History of hypotension she is on Florinef every morning-this appears to be stable recent blood pressures 101/54-140/85-131/80-142/75-at this point will monitor.  3.  History of COPD she continues on as needed albuterol I do not believe this has been an issue now in some time.  4.  History ofAnemia this appears stable with a hemoglobin of 13.1 on lab done last month.-She is on iron 325 mg a day  #5 history of hypokalemia she is on supplementation-we will try to obtain an updated potassium level.  6.-History of allergic rhinitis she is on Claritin appears this has been stable now for some time  #7 history of glaucoma-she continues on topical eyedrops including Cosopt-Travatan-carboxymethylcellulose as well as Systane-- but apparently refuses these at times-at this point staff is continuing to encourage her to use -she is legally blind    [id number]

## 2019-04-27 NOTE — Progress Notes (Signed)
Location:  [facility] Room Number: 304-D Place of Service:  SNF (31)  [name], MD  Patient Care Team: [name], MD as PCP - General (Internal Medicine) [name], MD (Gastroenterology) [name], NP (Obstetrics and Gynecology)  Extended Emergency Contact Information Primary Emergency Contact: [name],[name] Address: [place], [place] Phone: [phone] Mobile Phone: [phone] Relation: Daughter Secondary Emergency Contact: [name] Phone: [phone] Mobile Phone: [phone] Relation: Daughter    Allergies: Amoxicillin, Ampicillin, Depakote [valproic acid], Macrobid [nitrofurantoin macrocrystal], Penicillins, Acetaminophen, Azithromycin, Butalbital, Caffeine, Doxycycline, Escitalopram oxalate, Fioricet [butalbital-apap-caffeine], Latex, Levofloxacin, Levofloxacin, Nsaids, Oxycodone, Restasis [cyclosporine], Sulfa antibiotics, and Biaxin [clarithromycin]  Chief Complaint  Patient presents with  . Medical Management of Chronic Issues    Routine [facility] visit    HPI: Patient is an 86 y.o. female who   Past Medical History:  Diagnosis Date  . Anxiety   . Arterial tortuosity (aorta) 12/11/2012  . Asthma   . Chronic kidney disease, stage IV (severe) ([place]) 12/11/2012  . Chronic mental illness   . CKD (chronic kidney disease), stage III 01/14/2015  . Closed hip fracture requiring operative repair, right, with routine healing, subsequent encounter 12/19/2015  . Colitis   . COPD (chronic obstructive pulmonary disease) ([place])   . Decreased rectal sphincter tone 11/21/2012  . Depression   . Drug-seeking behavior 02/26/2011   Use of multiple md to obtain benzos/narcotics pt must sign contract to receive controlled meds.   Marland Kitchen Dysphagia 01/11/2015  . GERD (gastroesophageal reflux disease)   . Glaucoma 06/26/2011  .  Glaucoma of both eyes   . Hearing loss, sensorineural 03/04/2014   [facility]; 02/26/14. There is not a medical or surgical treatment that would benefit her type of hearing loss. We discussed amplification in an overview fashion.    Marland Kitchen History of fall   . Insomnia, unspecified   . Macular degeneration 06/26/2011  . Migraines   . Nasal bone fracture 03/30/2014  . Osteoporosis   . Osteoporosis 02/26/2011  . Presence of right artificial hip joint   . Protein-calorie malnutrition, severe ([place]) 12/10/2012  . S/P ORIF (open reduction internal fixation) fracture 01/08/2016  . Schatzki's ring 01/14/2015  . Scoliosis   . Substance abuse ([place])    Benbzos and Narcotics, she does not get any of those medicines from [facility], we have empathetically told her that.  . Vertigo   . Vitamin B deficiency   . Vitamin D deficiency 08/25/2016  . Weight loss     Past Surgical History:  Procedure Laterality Date  . ABDOMINAL HYSTERECTOMY    . CATARACT EXTRACTION    . CHOLECYSTECTOMY    . ESOPHAGOGASTRODUODENOSCOPY (EGD) WITH PROPOFOL N/A 01/13/2015   Procedure: ESOPHAGOGASTRODUODENOSCOPY (EGD) WITH PROPOFOL;  Surgeon: [name], MD;  Location: [facility];  Service: Endoscopy;  Laterality: N/A;  . HIP ARTHROPLASTY Right 12/21/2015   Procedure: ARTHROPLASTY BIPOLAR HIP (HEMIARTHROPLASTY);  Surgeon: [name], MD;  Location: [facility];  Service: Orthopedics;  Laterality: Right;  . PARTIAL HYSTERECTOMY      Allergies as of 04/27/2019      Reactions   Amoxicillin    Per daughter   Ampicillin    Per daughter   Depakote [valproic Acid]    DOES NOT WANT HER TO TAKE IT==Makes her hair fall out= per daughter who is poa  Macrobid [nitrofurantoin Macrocrystal] Rash   Per MAR   Penicillins    Per daughter   Acetaminophen Other (See Comments)   jaundice   Azithromycin Other (See Comments)   Per MAR   Butalbital    Caffeine    Doxycycline Other (See Comments)   Per MAR   Escitalopram Oxalate  Other (See Comments)   Per MAR   Fioricet [butalbital-apap-caffeine] Other (See Comments)   Drunk. Per MAR   Latex Other (See Comments)   Per MAR   Levofloxacin Other (See Comments)   Per MAR   Levofloxacin    Nsaids Other (See Comments)   This is not an allergy. The patient was told by Dr. Rockne Menghini to avoid NSAIDs and stop Vicoprofen in to 2014 to avoid nephrotoxicity. Per MAR.   Oxycodone Other (See Comments)   reaction to synthetic codeine Per MAR   Restasis [cyclosporine] Other (See Comments)   Per MAR   Sulfa Antibiotics Other (See Comments)   Per MAR   Biaxin [clarithromycin] Rash   With burning sensation Per [facility]      Medication List       Accurate as of April 27, 2019  9:34 PM. If you have any questions, ask your nurse or doctor.        STOP taking these medications   FLUoxetine 10 MG tablet Commonly known as: PROZAC Stopped by: Inocencio Homes, MD     TAKE these medications   acetaminophen 325 MG tablet Commonly known as: TYLENOL Take 650 mg by mouth every 6 (six) hours as needed for headache.   acetaminophen 500 MG tablet Commonly known as: TYLENOL Give 2 tablets ( 1,000 mg total ) by mouth nightly for pain   albuterol 108 (90 Base) MCG/ACT inhaler Commonly known as: VENTOLIN HFA Inhale 1 puff into the lungs every 6 (six) hours as needed for shortness of breath.   ammonium lactate 12 % lotion Commonly known as: LAC-HYDRIN Apply 1 application topically at bedtime as needed. Apply to bilateral feet for dry skin.   aspirin 81 MG chewable tablet Chew 81 mg by mouth daily.   Biotin 5000 MCG Caps Take 1 capsule by mouth daily.   bisacodyl 10 MG suppository Commonly known as: DULCOLAX If not relieved by MOM, give 10 mg Bisacodyl suppositiory rectally X 1 dose in 24 hours as needed (Do not use constipation standing orders for residents with renal failure/CFR less than 30. Contact MD for orders) (Physician Order)   carboxymethylcellulose 1 % ophthalmic  solution Place 1 drop into both eyes at bedtime.   citalopram 10 MG tablet Commonly known as: CELEXA Take 10 mg by mouth daily.   Claritin 10 MG tablet Generic drug: loratadine Take 10 mg by mouth daily.   Cranberry 450 MG Tabs Take 1 tablet by mouth 2 (two) times daily.   docusate sodium 100 MG capsule Commonly known as: COLACE Take 100 mg by mouth 2 (two) times daily.   dorzolamide-timolol 22.3-6.8 MG/ML ophthalmic solution Commonly known as: COSOPT Place 1 drop into both eyes 2 (two) times daily.   Ensure Take 237 mLs by mouth 4 (four) times daily. D/t weight loss   ferrous sulfate 325 (65 FE) MG tablet Take 325 mg by mouth daily.   fludrocortisone 0.1 MG tablet Commonly known as: FLORINEF Take 0.1 mg by mouth every morning. for orthostatic hypotension   lactulose 10 GM/15ML solution Commonly known as: CHRONULAC Take 15 mLs (10 g total) by mouth daily as needed for severe constipation.  magnesium hydroxide 400 MG/5ML suspension Commonly known as: MILK OF MAGNESIA If no BM in 3 days, give 30 cc Milk of Magnesium p.o. x 1 dose in 24 hours as needed (Do not use standing constipation orders for residents with renal failure CFR less than 30. Contact MD for orders) (Physician Order)   mirtazapine 15 MG tablet Commonly known as: REMERON Take 7.5 mg by mouth at bedtime.   nitroGLYCERIN 0.4 MG SL tablet Commonly known as: NITROSTAT Place 0.4 mg under the tongue every 5 (five) minutes as needed for chest pain. May use 3 times   polyethylene glycol 17 g packet Commonly known as: MIRALAX / GLYCOLAX Take 17 g by mouth daily.   potassium chloride SA 20 MEQ tablet Commonly known as: KLOR-CON Take 1 tablet (20 mEq total) by mouth daily.   QUEtiapine 100 MG tablet Commonly known as: SEROQUEL Take 100 mg by mouth at bedtime. Take along with a 25 mg tablet to = 125 mg qhs   QUEtiapine 25 MG tablet Commonly known as: SEROQUEL Take 25 mg by mouth at bedtime. Take along  with a 100 mg tablet to = 125 mg qhs   RA SALINE ENEMA RE If not relieved by Biscodyl suppository, give disposable Saline Enema rectally X 1 dose/24 hrs as needed (Do not use constipation standing orders for residents with renal failure/CFR less than 30. Contact MD for orders)(Physician Or   senna 8.6 MG tablet Commonly known as: SENOKOT Take 2 tablets by mouth 2 (two) times daily as needed for constipation.   Systane 0.4-0.3 % Soln Generic drug: Polyethyl Glycol-Propyl Glycol Place 2 drops into both eyes every 2 (two) hours as needed (dry eyes).   travoprost (benzalkonium) 0.004 % ophthalmic solution Commonly known as: TRAVATAN Place 1 drop into both eyes at bedtime.   vitamin B-12 1000 MCG tablet Commonly known as: CYANOCOBALAMIN Take 1,500 mcg by mouth daily. 1-1/2 tablets to = 1500 mcg   Vitamin D3 1.25 MG (50000 UT) Caps Take 1 capsule by mouth once a week.       No orders of the defined types were placed in this encounter.   Immunization History  Administered Date(s) Administered  . Influenza Split 11/13/2011  . Influenza-Unspecified 02/13/2008, 11/30/2013, 12/20/2016  . Moderna SARS-COVID-2 Vaccination 02/12/2019, 03/12/2019  . PPD Test 12/15/2015  . Pneumococcal Conjugate-13 10/16/2016  . Pneumococcal-Unspecified 02/13/2008  . Td 02/12/2009    Social History   Tobacco Use  . Smoking status: Former Smoker    Packs/day: 0.50    Types: Cigarettes  . Smokeless tobacco: Never Used  Substance Use Topics  . Alcohol use: No    Review of Systems  DATA OBTAINED: from patient, nurse, medical record, family member GENERAL:  no fevers, fatigue, appetite changes SKIN: No itching, rash HEENT: No complaint RESPIRATORY: No cough, wheezing, SOB CARDIAC: No chest pain, palpitations, lower extremity edema  GI: No abdominal pain, No N/V/D or constipation, No heartburn or reflux  GU: No dysuria, frequency or urgency, or incontinence  MUSCULOSKELETAL: No unrelieved  bone/joint pain NEUROLOGIC: No headache, dizziness  PSYCHIATRIC: No overt anxiety or sadness  Vitals:   04/27/19 1522  BP: (!) 101/54  Pulse: (!) 55  Resp: 17  Temp: (!) 97.1 F (36.2 C)  SpO2: 96%   Body mass index is 13.39 kg/m. Physical Exam  GENERAL APPEARANCE: Alert, conversant, No acute distress  SKIN: No diaphoresis rash HEENT: Unremarkable RESPIRATORY: Breathing is even, unlabored. Lung sounds are clear   CARDIOVASCULAR: Heart RRR no  murmurs, rubs or gallops. No peripheral edema  GASTROINTESTINAL: Abdomen is soft, non-tender, not distended w/ normal bowel sounds.  GENITOURINARY: Bladder non tender, not distended  MUSCULOSKELETAL: No abnormal joints or musculature NEUROLOGIC: Cranial nerves 2-12 grossly intact. Moves all extremities PSYCHIATRIC: Mood and affect appropriate to situation, no behavioral issues  Patient Active Problem List   Diagnosis Date Noted  . Allergic rhinitis 06/09/2018  . Pressure injury of skin 06/05/2018  . MRSA bacteremia 06/04/2018  . Chronic generalized pain 05/16/2018  . Dry eye syndrome of both eyes 02/28/2018  . Abnormal EKG 01/24/2017  . Delusions ([place])   . Paranoia ([place])   . Vitamin D deficiency 08/25/2016  . Dementia with behavioral problem ([place]) 03/25/2016  . S/P ORIF (open reduction internal fixation) fracture 01/08/2016  . Oral thrush 01/08/2016  . HTN (hypertension) 01/08/2016  . Closed hip fracture requiring operative repair, right, with routine healing, subsequent encounter 12/19/2015  . Bipolar disease, chronic ([place]) 12/17/2015  . Decubitus ulcer of hip, stage 3 ([place]) 12/11/2015  . Legally blind 08/16/2015  . Ankle pain   . Encephalopathy   . Altered mental status 01/26/2015  . Nausea and vomiting 01/17/2015  . Esophageal dysmotilities   . Urinary tract infectious disease   . Weakness 01/14/2015  . Schatzki's ring 01/14/2015  . CKD (chronic kidney disease), stage III ([place]) 01/14/2015  . Esophagus disorder   .  Dysphagia 01/11/2015  . Lower urinary tract infectious disease 03/30/2014  . Hearing loss, sensorineural 03/04/2014  . Nasal lesion 03/04/2014  . Constipation 12/12/2012  . Hypokalemia 12/11/2012  . Orthostatic hypotension 12/11/2012  . Arterial tortuosity (aorta) 12/11/2012  . Protein-calorie malnutrition, severe ([place]) 12/10/2012  . Adult failure to thrive 12/09/2012  . Decreased rectal sphincter tone 11/21/2012  . Primary open angle glaucoma of both eyes, severe stage 11/11/2012  . Idiopathic scoliosis 07/08/2012  . Macular atrophy, retinal 06/26/2011  . Vision disturbance 06/26/2011  . Glaucoma 06/26/2011  . Nonexudative senile macular degeneration of retina 06/21/2011  . Macula scar of posterior pole 06/21/2011  . Status post intraocular lens implant 06/21/2011  . Anemia of chronic disease 04/17/2011  . Drug-seeking behavior 02/26/2011  . Osteoporosis 02/26/2011  . Depression   . Anxiety   . COPD (chronic obstructive pulmonary disease) ([place])   . GERD (gastroesophageal reflux disease)   . Migraines   . Insomnia, unspecified     CMP     Component Value Date/Time   NA 141 03/27/2019 0000   NA 140 07/31/2017 0000   K 5.0 03/27/2019 0000   K 5.0 07/31/2017 0000   CL 105 03/27/2019 0000   CO2 26 (A) 03/27/2019 0000   GLUCOSE 89 06/08/2018 0317   BUN 28 (A) 03/27/2019 0000   CREATININE 0.7 03/27/2019 0000   CREATININE 0.70 06/08/2018 0317   CREATININE 0.62 07/31/2017 0000   CALCIUM 10.1 03/27/2019 0000   CALCIUM 8.9 07/31/2017 0000   PROT 5.7 (L) 06/05/2018 0320   PROT 5.8 07/31/2017 0000   ALBUMIN 3.7 03/27/2019 0000   ALBUMIN 3.5 07/31/2017 0000   AST 17 03/27/2019 0000   AST 14 07/31/2017 0000   ALT 10 03/27/2019 0000   ALT 5 07/31/2017 0000   ALKPHOS 93 03/27/2019 0000   ALKPHOS 87 07/31/2017 0000   BILITOT 0.5 06/05/2018 0320   BILITOT 0.3 07/31/2017 0000   GFRNONAA 72.39 03/27/2019 0000   GFRNONAA 81.51 07/31/2017 0000   GFRNONAA 64 11/09/2013 1135    GFRAA 83.90 03/27/2019 0000   GFRAA 74  11/09/2013 1135   Recent Labs    06/06/18 0646 06/06/18 0646 06/07/18 0317 06/07/18 0317 06/08/18 0317 06/08/18 1052 12/06/18 0000 01/28/19 0000 03/27/19 0000  NA 145   < > 144   < > 142   < > 144 141 141  K 2.4*   < > 2.9*   < > 2.4*   < > 4.9 3.9 5.0  CL 111   < > 114*   < > 106   < > 104 103 105  CO2 21*   < > 19*   < > 21*   < > 26* 28* 26*  GLUCOSE 100*  --  110*  --  89  --   --   --   --   BUN 15   < > 12   < > 11   < > 23* 22* 28*  CREATININE 0.67   < > 0.58   < > 0.70   < > 0.8 0.7 0.7  CALCIUM 8.5*   < > 8.7*   < > 9.0  --  9.6 9.5 10.1  MG  --   --   --   --  1.6*  --   --   --   --    < > = values in this interval not displayed.   Recent Labs    06/04/18 1121 06/04/18 1121 06/05/18 0320 09/02/18 0000 03/27/19 0000  AST 22   < > 22 17 17   ALT 16   < > 16 9 10   ALKPHOS 66   < > 59 98 93  BILITOT 0.5  --  0.5  --   --   PROT 5.9*  --  5.7*  --   --   ALBUMIN 2.8*  --  2.5*  --  3.7   < > = values in this interval not displayed.   Recent Labs    06/06/18 0646 06/06/18 0646 06/07/18 0317 06/07/18 0317 06/08/18 0317 06/23/18 0000 11/22/18 0000 01/28/19 0000 03/27/19 0000  WBC 8.6   < > 7.4   < > 7.6   < > 8.3 6.1 6.4  NEUTROABS  --   --   --   --   --    < > 6 4 3   HGB 11.3*   < > 11.9*   < > 12.8   < > 12.3 12.0 13.1  HCT 34.9*   < > 36.1   < > 38.8   < > 37 37 39  MCV 93.1  --  90.7  --  90.2  --   --   --   --   PLT 167   < > 205   < > 200   < > 234 227 218   < > = values in this interval not displayed.   No results for input(s): CHOL, LDLCALC, TRIG in the last 8760 hours.  Invalid input(s): HCL No results found for: [facility] Lab Results  Component Value Date   TSH 0.59 07/31/2017   Lab Results  Component Value Date   HGBA1C 5.4 06/11/2016   Lab Results  Component Value Date   CHOL 134 07/31/2017   HDL 54 06/11/2016   LDLCALC 61 07/31/2017   TRIG 102 07/31/2017    Significant Diagnostic  Results in last 30 days:  No results found.  Assessment and Plan  No problem-specific Assessment & Plan notes found for this encounter.   Labs/tests ordered:    [name],  MD

## 2019-04-28 ENCOUNTER — Non-Acute Institutional Stay (SKILLED_NURSING_FACILITY): Payer: Medicare Other | Admitting: Internal Medicine

## 2019-04-28 DIAGNOSIS — I10 Essential (primary) hypertension: Secondary | ICD-10-CM | POA: Diagnosis not present

## 2019-04-28 DIAGNOSIS — Z710 Person encountering health services to consult on behalf of another person: Secondary | ICD-10-CM

## 2019-04-28 DIAGNOSIS — D649 Anemia, unspecified: Secondary | ICD-10-CM | POA: Diagnosis not present

## 2019-04-28 DIAGNOSIS — R627 Adult failure to thrive: Secondary | ICD-10-CM

## 2019-04-28 DIAGNOSIS — F03C0 Unspecified dementia, severe, without behavioral disturbance, psychotic disturbance, mood disturbance, and anxiety: Secondary | ICD-10-CM

## 2019-04-28 DIAGNOSIS — F0390 Unspecified dementia without behavioral disturbance: Secondary | ICD-10-CM | POA: Diagnosis not present

## 2019-04-28 LAB — BASIC METABOLIC PANEL
BUN: 41 — AB (ref 4–21)
CO2: 25 — AB (ref 13–22)
Chloride: 105 (ref 99–108)
Creatinine: 1.4 — AB (ref 0.5–1.1)
Glucose: 125
Potassium: 3.5 (ref 3.4–5.3)
Sodium: 145 (ref 137–147)

## 2019-04-28 LAB — COMPREHENSIVE METABOLIC PANEL
Calcium: 13.5 — AB (ref 8.7–10.7)
GFR calc Af Amer: 38.13
GFR calc non Af Amer: 32.9

## 2019-04-29 ENCOUNTER — Encounter: Payer: Self-pay | Admitting: Internal Medicine

## 2019-04-29 NOTE — Progress Notes (Signed)
Location:      Place of Service:   SNF  Taylor Duos, MD  Patient Care Team: Taylor Duos, MD as PCP - General (Internal Medicine) [name], MD (Gastroenterology) [name], NP (Obstetrics and Gynecology)  Extended Emergency Contact Information Primary Emergency Contact: [name],[name] Address: [place], [place] Phone: [phone] Mobile Phone: [phone] Relation: Daughter Secondary Emergency Contact: [name] Phone: [phone] Mobile Phone: [phone] Relation: Daughter    Allergies: Amoxicillin, Ampicillin, Depakote [valproic acid], Macrobid [nitrofurantoin macrocrystal], Penicillins, Acetaminophen, Azithromycin, Butalbital, Caffeine, Doxycycline, Escitalopram oxalate, Fioricet [butalbital-apap-caffeine], Latex, Levofloxacin, Levofloxacin, Nsaids, Oxycodone, Restasis [cyclosporine], Sulfa antibiotics, and Biaxin [clarithromycin]  Chief Complaint  Patient presents with  . Acute Visit    HPI: Patient is 86 y.o. female who is family conference with her daughter.  In attendance is Caren Griffins and myself.  Past Medical History:  Diagnosis Date  . Anxiety   . Arterial tortuosity (aorta) 12/11/2012  . Asthma   . Chronic kidney disease, stage IV (severe) ([place]) 12/11/2012  . Chronic mental illness   . CKD (chronic kidney disease), stage III 01/14/2015  . Closed hip fracture requiring operative repair, right, with routine healing, subsequent encounter 12/19/2015  . Colitis   . COPD (chronic obstructive pulmonary disease) ([place])   . Decreased rectal sphincter tone 11/21/2012  . Depression   . Drug-seeking behavior 02/26/2011   Use of multiple md to obtain benzos/narcotics pt must sign contract to receive controlled meds.   Marland Kitchen Dysphagia 01/11/2015  . GERD (gastroesophageal reflux disease)   . Glaucoma 06/26/2011  . Glaucoma of both eyes   . Hearing loss,  sensorineural 03/04/2014   [facility]; 02/26/14. There is not a medical or surgical treatment that would benefit her type of hearing loss. We discussed amplification in an overview fashion.    Marland Kitchen History of fall   . Insomnia, unspecified   . Macular degeneration 06/26/2011  . Migraines   . Nasal bone fracture 03/30/2014  . Osteoporosis   . Osteoporosis 02/26/2011  . Presence of right artificial hip joint   . Protein-calorie malnutrition, severe ([place]) 12/10/2012  . S/P ORIF (open reduction internal fixation) fracture 01/08/2016  . Schatzki's ring 01/14/2015  . Scoliosis   . Substance abuse ([place])    Benbzos and Narcotics, she does not get any of those medicines from [facility], we have empathetically told her that.  . Vertigo   . Vitamin B deficiency   . Vitamin D deficiency 08/25/2016  . Weight loss     Past Surgical History:  Procedure Laterality Date  . ABDOMINAL HYSTERECTOMY    . CATARACT EXTRACTION    . CHOLECYSTECTOMY    . ESOPHAGOGASTRODUODENOSCOPY (EGD) WITH PROPOFOL N/A 01/13/2015   Procedure: ESOPHAGOGASTRODUODENOSCOPY (EGD) WITH PROPOFOL;  Surgeon: [name], MD;  Location: [facility];  Service: Endoscopy;  Laterality: N/A;  . HIP ARTHROPLASTY Right 12/21/2015   Procedure: ARTHROPLASTY BIPOLAR HIP (HEMIARTHROPLASTY);  Surgeon: [name], MD;  Location: [facility];  Service: Orthopedics;  Laterality: Right;  . PARTIAL HYSTERECTOMY      Allergies as of 04/28/2019      Reactions   Amoxicillin    Per daughter   Ampicillin    Per daughter   Depakote [valproic Acid]    DOES NOT WANT HER TO TAKE IT==Makes her hair fall out= per daughter who is poa   Macrobid [nitrofurantoin Macrocrystal] Rash  Per MAR   Penicillins    Per daughter   Acetaminophen Other (See Comments)   jaundice   Azithromycin Other (See Comments)   Per MAR   Butalbital    Caffeine    Doxycycline Other (See Comments)   Per MAR   Escitalopram Oxalate Other (See Comments)   Per MAR   Fioricet  [butalbital-apap-caffeine] Other (See Comments)   Drunk. Per MAR   Latex Other (See Comments)   Per MAR   Levofloxacin Other (See Comments)   Per MAR   Levofloxacin    Nsaids Other (See Comments)   This is not an allergy. The patient was told by Dr. Rockne Menghini to avoid NSAIDs and stop Vicoprofen in to 2014 to avoid nephrotoxicity. Per MAR.   Oxycodone Other (See Comments)   reaction to synthetic codeine Per MAR   Restasis [cyclosporine] Other (See Comments)   Per MAR   Sulfa Antibiotics Other (See Comments)   Per MAR   Biaxin [clarithromycin] Rash   With burning sensation Per [facility]      Medication List       Accurate as of April 28, 2019 11:59 PM. If you have any questions, ask your nurse or doctor.        acetaminophen 325 MG tablet Commonly known as: TYLENOL Take 650 mg by mouth every 6 (six) hours as needed for headache.   acetaminophen 500 MG tablet Commonly known as: TYLENOL Give 2 tablets ( 1,000 mg total ) by mouth nightly for pain   albuterol 108 (90 Base) MCG/ACT inhaler Commonly known as: VENTOLIN HFA Inhale 1 puff into the lungs every 6 (six) hours as needed for shortness of breath.   ammonium lactate 12 % lotion Commonly known as: LAC-HYDRIN Apply 1 application topically at bedtime as needed. Apply to bilateral feet for dry skin.   aspirin 81 MG chewable tablet Chew 81 mg by mouth daily.   Biotin 5000 MCG Caps Take 1 capsule by mouth daily.   bisacodyl 10 MG suppository Commonly known as: DULCOLAX If not relieved by MOM, give 10 mg Bisacodyl suppositiory rectally X 1 dose in 24 hours as needed (Do not use constipation standing orders for residents with renal failure/CFR less than 30. Contact MD for orders) (Physician Order)   carboxymethylcellulose 1 % ophthalmic solution Place 1 drop into both eyes at bedtime.   citalopram 10 MG tablet Commonly known as: CELEXA Take 10 mg by mouth daily.   Claritin 10 MG tablet Generic drug: loratadine Take 10 mg  by mouth daily.   Cranberry 450 MG Tabs Take 1 tablet by mouth 2 (two) times daily.   docusate sodium 100 MG capsule Commonly known as: COLACE Take 100 mg by mouth 2 (two) times daily.   dorzolamide-timolol 22.3-6.8 MG/ML ophthalmic solution Commonly known as: COSOPT Place 1 drop into both eyes 2 (two) times daily.   Ensure Take 237 mLs by mouth 4 (four) times daily. D/t weight loss   ferrous sulfate 325 (65 FE) MG tablet Take 325 mg by mouth daily.   fludrocortisone 0.1 MG tablet Commonly known as: FLORINEF Take 0.1 mg by mouth every morning. for orthostatic hypotension   lactulose 10 GM/15ML solution Commonly known as: CHRONULAC Take 15 mLs (10 g total) by mouth daily as needed for severe constipation.   magnesium hydroxide 400 MG/5ML suspension Commonly known as: MILK OF MAGNESIA If no BM in 3 days, give 30 cc Milk of Magnesium p.o. x 1 dose in 24 hours as needed (Do  not use standing constipation orders for residents with renal failure CFR less than 30. Contact MD for orders) (Physician Order)   mirtazapine 15 MG tablet Commonly known as: REMERON Take 7.5 mg by mouth at bedtime.   nitroGLYCERIN 0.4 MG SL tablet Commonly known as: NITROSTAT Place 0.4 mg under the tongue every 5 (five) minutes as needed for chest pain. May use 3 times   polyethylene glycol 17 g packet Commonly known as: MIRALAX / GLYCOLAX Take 17 g by mouth daily.   potassium chloride SA 20 MEQ tablet Commonly known as: KLOR-CON Take 1 tablet (20 mEq total) by mouth daily.   QUEtiapine 100 MG tablet Commonly known as: SEROQUEL Take 100 mg by mouth at bedtime. Take along with a 25 mg tablet to = 125 mg qhs   QUEtiapine 25 MG tablet Commonly known as: SEROQUEL Take 25 mg by mouth at bedtime. Take along with a 100 mg tablet to = 125 mg qhs   RA SALINE ENEMA RE If not relieved by Biscodyl suppository, give disposable Saline Enema rectally X 1 dose/24 hrs as needed (Do not use constipation standing  orders for residents with renal failure/CFR less than 30. Contact MD for orders)(Physician Or   senna 8.6 MG tablet Commonly known as: SENOKOT Take 2 tablets by mouth 2 (two) times daily as needed for constipation.   Systane 0.4-0.3 % Soln Generic drug: Polyethyl Glycol-Propyl Glycol Place 2 drops into both eyes every 2 (two) hours as needed (dry eyes).   travoprost (benzalkonium) 0.004 % ophthalmic solution Commonly known as: TRAVATAN Place 1 drop into both eyes at bedtime.   vitamin B-12 1000 MCG tablet Commonly known as: CYANOCOBALAMIN Take 1,500 mcg by mouth daily. 1-1/2 tablets to = 1500 mcg   Vitamin D3 1.25 MG (50000 UT) Caps Take 1 capsule by mouth once a week.       No orders of the defined types were placed in this encounter.   Immunization History  Administered Date(s) Administered  . Influenza Split 11/13/2011  . Influenza-Unspecified 02/13/2008, 11/30/2013, 12/20/2016  . Moderna SARS-COVID-2 Vaccination 02/12/2019, 03/12/2019  . PPD Test 12/15/2015  . Pneumococcal Conjugate-13 10/16/2016  . Pneumococcal-Unspecified 02/13/2008  . Td 02/12/2009    Social History   Tobacco Use  . Smoking status: Former Smoker    Packs/day: 0.50    Types: Cigarettes  . Smokeless tobacco: Never Used  Substance Use Topics  . Alcohol use: No    Review of Systems  Patient with minimal input GENERAL:  no fevers, fatigue, appetite changes SKIN: No itching, rash HEENT: No complaint RESPIRATORY: No cough, wheezing, SOB CARDIAC: No chest pain, palpitations, lower extremity edema  GI: No abdominal pain, No N/V/D or constipation, No heartburn or reflux  GU: No dysuria, frequency or urgency, or incontinence  MUSCULOSKELETAL: No unrelieved bone/joint pain NEUROLOGIC: No headache, dizziness  PSYCHIATRIC: No overt anxiety or sadness  Vitals:   04/29/19 1402  BP: (!) 101/54  Pulse: (!) 55  Resp: 17  Temp: (!) 97.1 F (36.2 C)   Body mass index is 13.33 kg/m. Physical  Exam  GENERAL APPEARANCE: Alert, minimally conversant, No acute distress  SKIN: No diaphoresis rash HEENT: Unremarkable RESPIRATORY: Breathing is even, unlabored. Lung sounds are clear   CARDIOVASCULAR: Heart RRR no murmurs, rubs or gallops. No peripheral edema  GASTROINTESTINAL: Abdomen is soft, non-tender, not distended w/ normal bowel sounds.  GENITOURINARY: Bladder non tender, not distended  MUSCULOSKELETAL: No abnormal joints or musculature except very thin NEUROLOGIC: Cranial nerves 2-12  grossly intact. Moves all extremities PSYCHIATRIC: Mood and affect with dementia, no behavioral issues  Patient Active Problem List   Diagnosis Date Noted  . Allergic rhinitis 06/09/2018  . Pressure injury of skin 06/05/2018  . MRSA bacteremia 06/04/2018  . Chronic generalized pain 05/16/2018  . Dry eye syndrome of both eyes 02/28/2018  . Abnormal EKG 01/24/2017  . Delusions ([place])   . Paranoia ([place])   . Vitamin D deficiency 08/25/2016  . Dementia with behavioral problem ([place]) 03/25/2016  . S/P ORIF (open reduction internal fixation) fracture 01/08/2016  . Oral thrush 01/08/2016  . HTN (hypertension) 01/08/2016  . Closed hip fracture requiring operative repair, right, with routine healing, subsequent encounter 12/19/2015  . Bipolar disease, chronic ([place]) 12/17/2015  . Decubitus ulcer of hip, stage 3 ([place]) 12/11/2015  . Legally blind 08/16/2015  . Ankle pain   . Encephalopathy   . Altered mental status 01/26/2015  . Nausea and vomiting 01/17/2015  . Esophageal dysmotilities   . Urinary tract infectious disease   . Weakness 01/14/2015  . Schatzki's ring 01/14/2015  . CKD (chronic kidney disease), stage III ([place]) 01/14/2015  . Esophagus disorder   . Dysphagia 01/11/2015  . Lower urinary tract infectious disease 03/30/2014  . Hearing loss, sensorineural 03/04/2014  . Nasal lesion 03/04/2014  . Constipation 12/12/2012  . Hypokalemia 12/11/2012  . Orthostatic hypotension 12/11/2012  .  Arterial tortuosity (aorta) 12/11/2012  . Protein-calorie malnutrition, severe ([place]) 12/10/2012  . Adult failure to thrive 12/09/2012  . Decreased rectal sphincter tone 11/21/2012  . Primary open angle glaucoma of both eyes, severe stage 11/11/2012  . Idiopathic scoliosis 07/08/2012  . Macular atrophy, retinal 06/26/2011  . Vision disturbance 06/26/2011  . Glaucoma 06/26/2011  . Nonexudative senile macular degeneration of retina 06/21/2011  . Macula scar of posterior pole 06/21/2011  . Status post intraocular lens implant 06/21/2011  . Anemia of chronic disease 04/17/2011  . Drug-seeking behavior 02/26/2011  . Osteoporosis 02/26/2011  . Depression   . Anxiety   . COPD (chronic obstructive pulmonary disease) ([place])   . GERD (gastroesophageal reflux disease)   . Migraines   . Insomnia, unspecified     CMP     Component Value Date/Time   NA 141 03/27/2019 0000   NA 140 07/31/2017 0000   K 5.0 03/27/2019 0000   K 5.0 07/31/2017 0000   CL 105 03/27/2019 0000   CO2 26 (A) 03/27/2019 0000   GLUCOSE 89 06/08/2018 0317   BUN 28 (A) 03/27/2019 0000   CREATININE 0.7 03/27/2019 0000   CREATININE 0.70 06/08/2018 0317   CREATININE 0.62 07/31/2017 0000   CALCIUM 10.1 03/27/2019 0000   CALCIUM 8.9 07/31/2017 0000   PROT 5.7 (L) 06/05/2018 0320   PROT 5.8 07/31/2017 0000   ALBUMIN 3.7 03/27/2019 0000   ALBUMIN 3.5 07/31/2017 0000   AST 17 03/27/2019 0000   AST 14 07/31/2017 0000   ALT 10 03/27/2019 0000   ALT 5 07/31/2017 0000   ALKPHOS 93 03/27/2019 0000   ALKPHOS 87 07/31/2017 0000   BILITOT 0.5 06/05/2018 0320   BILITOT 0.3 07/31/2017 0000   GFRNONAA 72.39 03/27/2019 0000   GFRNONAA 81.51 07/31/2017 0000   GFRNONAA 64 11/09/2013 1135   GFRAA 83.90 03/27/2019 0000   GFRAA 74 11/09/2013 1135   Recent Labs    06/06/18 0646 06/06/18 0646 06/07/18 0317 06/07/18 0317 06/08/18 0317 06/08/18 1052 12/06/18 0000 01/28/19 0000 03/27/19 0000  NA 145   < > 144   < >  142   < >  144 141 141  K 2.4*   < > 2.9*   < > 2.4*   < > 4.9 3.9 5.0  CL 111   < > 114*   < > 106   < > 104 103 105  CO2 21*   < > 19*   < > 21*   < > 26* 28* 26*  GLUCOSE 100*  --  110*  --  89  --   --   --   --   BUN 15   < > 12   < > 11   < > 23* 22* 28*  CREATININE 0.67   < > 0.58   < > 0.70   < > 0.8 0.7 0.7  CALCIUM 8.5*   < > 8.7*   < > 9.0  --  9.6 9.5 10.1  MG  --   --   --   --  1.6*  --   --   --   --    < > = values in this interval not displayed.   Recent Labs    06/04/18 1121 06/04/18 1121 06/05/18 0320 09/02/18 0000 03/27/19 0000  AST 22   < > 22 17 17   ALT 16   < > 16 9 10   ALKPHOS 66   < > 59 98 93  BILITOT 0.5  --  0.5  --   --   PROT 5.9*  --  5.7*  --   --   ALBUMIN 2.8*  --  2.5*  --  3.7   < > = values in this interval not displayed.   Recent Labs    06/06/18 0646 06/06/18 0646 06/07/18 0317 06/07/18 0317 06/08/18 0317 06/23/18 0000 11/22/18 0000 01/28/19 0000 03/27/19 0000  WBC 8.6   < > 7.4   < > 7.6   < > 8.3 6.1 6.4  NEUTROABS  --   --   --   --   --    < > 6 4 3   HGB 11.3*   < > 11.9*   < > 12.8   < > 12.3 12.0 13.1  HCT 34.9*   < > 36.1   < > 38.8   < > 37 37 39  MCV 93.1  --  90.7  --  90.2  --   --   --   --   PLT 167   < > 205   < > 200   < > 234 227 218   < > = values in this interval not displayed.   No results for input(s): CHOL, LDLCALC, TRIG in the last 8760 hours.  Invalid input(s): HCL No results found for: [facility] Lab Results  Component Value Date   TSH 0.59 07/31/2017   Lab Results  Component Value Date   HGBA1C 5.4 06/11/2016   Lab Results  Component Value Date   CHOL 134 07/31/2017   HDL 54 06/11/2016   LDLCALC 61 07/31/2017   TRIG 102 07/31/2017    Significant Diagnostic Results in last 30 days:  No results found.  Assessment and Plan  Advanced dementia/encounter for family conference with outpatient present-this meeting was sparked by 2 things 1 was that patient has been having a downhill trajectory and 2 was  that her calcium was elevated to 13.5 whereas 1 month ago it was 10.  This is highly suspicious for malignancy, and would explain weight loss seems to be more than  could be explained by patient's appetite.  We broached with the daughter Ms. Galyon may be getting ready to have a big decline.  She would not be a candidate for work-up of the cancer goal for treatment family would like to keep her comfortable.  Daughter became upset with the situation and we did not at this time the broach a MOST form.    Time spent greater than 35 minutes Taylor Duos, MD

## 2019-04-29 NOTE — Progress Notes (Signed)
This encounter was created in error - please disregard.

## 2019-05-05 ENCOUNTER — Encounter: Payer: Self-pay | Admitting: Internal Medicine

## 2019-05-05 NOTE — Progress Notes (Signed)
Location:  [facility] Room Number: 304-D Place of Service:  SNF (31)  [name], MD  Patient Care Team: [name], MD as PCP - General (Internal Medicine) [name], MD (Gastroenterology) [name], NP (Obstetrics and Gynecology)  Extended Emergency Contact Information Primary Emergency Contact: [name],[name] Address: [place], [place] Phone: [phone] Mobile Phone: [phone] Relation: Daughter Secondary Emergency Contact: [name] Phone: [phone] Mobile Phone: [phone] Relation: Daughter    Allergies: Amoxicillin, Ampicillin, Depakote [valproic acid], Macrobid [nitrofurantoin macrocrystal], Penicillins, Acetaminophen, Azithromycin, Butalbital, Caffeine, Doxycycline, Escitalopram oxalate, Fioricet [butalbital-apap-caffeine], Latex, Levofloxacin, Levofloxacin, Nsaids, Oxycodone, Restasis [cyclosporine], Sulfa antibiotics, and Biaxin [clarithromycin]  Chief Complaint  Patient presents with  . Acute Visit    Patient was seen for a family conference    HPI: Patient is an 86 y.o. female who   Past Medical History:  Diagnosis Date  . Anxiety   . Arterial tortuosity (aorta) 12/11/2012  . Asthma   . Chronic kidney disease, stage IV (severe) ([place]) 12/11/2012  . Chronic mental illness   . CKD (chronic kidney disease), stage III 01/14/2015  . Closed hip fracture requiring operative repair, right, with routine healing, subsequent encounter 12/19/2015  . Colitis   . COPD (chronic obstructive pulmonary disease) ([place])   . Decreased rectal sphincter tone 11/21/2012  . Depression   . Drug-seeking behavior 02/26/2011   Use of multiple md to obtain benzos/narcotics pt must sign contract to receive controlled meds.   Marland Kitchen Dysphagia 01/11/2015  . GERD (gastroesophageal reflux disease)   . Glaucoma 06/26/2011  . Glaucoma of  both eyes   . Hearing loss, sensorineural 03/04/2014   [facility]; 02/26/14. There is not a medical or surgical treatment that would benefit her type of hearing loss. We discussed amplification in an overview fashion.    Marland Kitchen History of fall   . Insomnia, unspecified   . Macular degeneration 06/26/2011  . Migraines   . Nasal bone fracture 03/30/2014  . Osteoporosis   . Osteoporosis 02/26/2011  . Presence of right artificial hip joint   . Protein-calorie malnutrition, severe ([place]) 12/10/2012  . S/P ORIF (open reduction internal fixation) fracture 01/08/2016  . Schatzki's ring 01/14/2015  . Scoliosis   . Substance abuse ([place])    Benbzos and Narcotics, she does not get any of those medicines from [facility], we have empathetically told her that.  . Vertigo   . Vitamin B deficiency   . Vitamin D deficiency 08/25/2016  . Weight loss     Past Surgical History:  Procedure Laterality Date  . ABDOMINAL HYSTERECTOMY    . CATARACT EXTRACTION    . CHOLECYSTECTOMY    . ESOPHAGOGASTRODUODENOSCOPY (EGD) WITH PROPOFOL N/A 01/13/2015   Procedure: ESOPHAGOGASTRODUODENOSCOPY (EGD) WITH PROPOFOL;  Surgeon: [name], MD;  Location: [facility];  Service: Endoscopy;  Laterality: N/A;  . HIP ARTHROPLASTY Right 12/21/2015   Procedure: ARTHROPLASTY BIPOLAR HIP (HEMIARTHROPLASTY);  Surgeon: [name], MD;  Location: [facility];  Service: Orthopedics;  Laterality: Right;  . PARTIAL HYSTERECTOMY      Allergies as of 05/05/2019      Reactions   Amoxicillin    Per daughter   Ampicillin    Per daughter   Depakote [valproic Acid]    DOES NOT WANT HER TO TAKE IT==Makes her hair fall out= per daughter who is poa  Macrobid [nitrofurantoin Macrocrystal] Rash   Per MAR   Penicillins    Per daughter   Acetaminophen Other (See Comments)   jaundice   Azithromycin Other (See Comments)   Per MAR   Butalbital    Caffeine    Doxycycline Other (See Comments)   Per MAR   Escitalopram Oxalate Other (See  Comments)   Per MAR   Fioricet [butalbital-apap-caffeine] Other (See Comments)   Drunk. Per MAR   Latex Other (See Comments)   Per MAR   Levofloxacin Other (See Comments)   Per MAR   Levofloxacin    Nsaids Other (See Comments)   This is not an allergy. The patient was told by Dr. Rockne Menghini to avoid NSAIDs and stop Vicoprofen in to 2014 to avoid nephrotoxicity. Per MAR.   Oxycodone Other (See Comments)   reaction to synthetic codeine Per MAR   Restasis [cyclosporine] Other (See Comments)   Per MAR   Sulfa Antibiotics Other (See Comments)   Per MAR   Biaxin [clarithromycin] Rash   With burning sensation Per [facility]      Medication List       Accurate as of May 05, 2019 11:59 PM. If you have any questions, ask your nurse or doctor.        acetaminophen 325 MG tablet Commonly known as: TYLENOL Take 650 mg by mouth every 6 (six) hours as needed for headache.   acetaminophen 500 MG tablet Commonly known as: TYLENOL Give 2 tablets ( 1,000 mg total ) by mouth nightly for pain   albuterol 108 (90 Base) MCG/ACT inhaler Commonly known as: VENTOLIN HFA Inhale 1 puff into the lungs every 6 (six) hours as needed for shortness of breath.   ammonium lactate 12 % lotion Commonly known as: LAC-HYDRIN Apply 1 application topically at bedtime as needed. Apply to bilateral feet for dry skin.   aspirin 81 MG chewable tablet Chew 81 mg by mouth daily.   Biotin 5000 MCG Caps Take 1 capsule by mouth daily.   bisacodyl 10 MG suppository Commonly known as: DULCOLAX If not relieved by MOM, give 10 mg Bisacodyl suppositiory rectally X 1 dose in 24 hours as needed (Do not use constipation standing orders for residents with renal failure/CFR less than 30. Contact MD for orders) (Physician Order)   citalopram 10 MG tablet Commonly known as: CELEXA Take 10 mg by mouth daily.   Claritin 10 MG tablet Generic drug: loratadine Take 10 mg by mouth daily.   Cranberry 450 MG Tabs Take 1 tablet by  mouth 2 (two) times daily.   docusate sodium 100 MG capsule Commonly known as: COLACE Take 100 mg by mouth 2 (two) times daily.   dorzolamide-timolol 22.3-6.8 MG/ML ophthalmic solution Commonly known as: COSOPT Place 1 drop into both eyes 2 (two) times daily.   Ensure Take 237 mLs by mouth 4 (four) times daily. D/t weight loss   ferrous sulfate 325 (65 FE) MG tablet Take 325 mg by mouth daily.   fludrocortisone 0.1 MG tablet Commonly known as: FLORINEF Take 0.1 mg by mouth every morning. for orthostatic hypotension   lactulose 10 GM/15ML solution Commonly known as: CHRONULAC Take 15 mLs (10 g total) by mouth daily as needed for severe constipation.   magnesium hydroxide 400 MG/5ML suspension Commonly known as: MILK OF MAGNESIA If no BM in 3 days, give 30 cc Milk of Magnesium p.o. x 1 dose in 24 hours as needed (Do not use standing constipation orders for residents with renal  failure CFR less than 30. Contact MD for orders) (Physician Order)   mirtazapine 15 MG tablet Commonly known as: REMERON Take 7.5 mg by mouth at bedtime.   nitroGLYCERIN 0.4 MG SL tablet Commonly known as: NITROSTAT Place 0.4 mg under the tongue every 5 (five) minutes as needed for chest pain. May use 3 times   polyethylene glycol 17 g packet Commonly known as: MIRALAX / GLYCOLAX Take 17 g by mouth daily.   potassium chloride SA 20 MEQ tablet Commonly known as: KLOR-CON Take 1 tablet (20 mEq total) by mouth daily.   QUEtiapine 100 MG tablet Commonly known as: SEROQUEL Take 100 mg by mouth at bedtime. Take along with a 25 mg tablet to = 125 mg qhs   QUEtiapine 25 MG tablet Commonly known as: SEROQUEL Take 25 mg by mouth at bedtime. Take along with a 100 mg tablet to = 125 mg qhs   RA SALINE ENEMA RE If not relieved by Biscodyl suppository, give disposable Saline Enema rectally X 1 dose/24 hrs as needed (Do not use constipation standing orders for residents with renal failure/CFR less than 30.  Contact MD for orders)(Physician Or   senna 8.6 MG tablet Commonly known as: SENOKOT Take 2 tablets by mouth 2 (two) times daily as needed for constipation.   Systane 0.4-0.3 % Soln Generic drug: Polyethyl Glycol-Propyl Glycol Place 2 drops into both eyes every 2 (two) hours as needed (dry eyes).   travoprost (benzalkonium) 0.004 % ophthalmic solution Commonly known as: TRAVATAN Place 1 drop into both eyes at bedtime.   vitamin B-12 1000 MCG tablet Commonly known as: CYANOCOBALAMIN Take 1,500 mcg by mouth daily. 1-1/2 tablets to = 1500 mcg   Vitamin D3 1.25 MG (50000 UT) Caps Take 1 capsule by mouth once a week.       No orders of the defined types were placed in this encounter.   Immunization History  Administered Date(s) Administered  . Influenza Split 11/13/2011  . Influenza-Unspecified 02/13/2008, 11/30/2013, 12/20/2016  . Moderna SARS-COVID-2 Vaccination 02/12/2019, 03/12/2019  . PPD Test 12/15/2015  . Pneumococcal Conjugate-13 10/16/2016  . Pneumococcal-Unspecified 02/13/2008  . Td 02/12/2009    Social History   Tobacco Use  . Smoking status: Former Smoker    Packs/day: 0.50    Types: Cigarettes  . Smokeless tobacco: Never Used  Substance Use Topics  . Alcohol use: No    Review of Systems  DATA OBTAINED: from patient, nurse, medical record, family member GENERAL:  no fevers, fatigue, appetite changes SKIN: No itching, rash HEENT: No complaint RESPIRATORY: No cough, wheezing, SOB CARDIAC: No chest pain, palpitations, lower extremity edema  GI: No abdominal pain, No N/V/D or constipation, No heartburn or reflux  GU: No dysuria, frequency or urgency, or incontinence  MUSCULOSKELETAL: No unrelieved bone/joint pain NEUROLOGIC: No headache, dizziness  PSYCHIATRIC: No overt anxiety or sadness  Vitals:   05/05/19 1533  BP: 119/76  Pulse: 63  Resp: 18  Temp: 97.6 F (36.4 C)  SpO2: 97%   Body mass index is 13.39 kg/m. Physical Exam  GENERAL  APPEARANCE: Alert, conversant, No acute distress  SKIN: No diaphoresis rash HEENT: Unremarkable RESPIRATORY: Breathing is even, unlabored. Lung sounds are clear   CARDIOVASCULAR: Heart RRR no murmurs, rubs or gallops. No peripheral edema  GASTROINTESTINAL: Abdomen is soft, non-tender, not distended w/ normal bowel sounds.  GENITOURINARY: Bladder non tender, not distended  MUSCULOSKELETAL: No abnormal joints or musculature NEUROLOGIC: Cranial nerves 2-12 grossly intact. Moves all extremities PSYCHIATRIC: Mood and  affect appropriate to situation, no behavioral issues  Patient Active Problem List   Diagnosis Date Noted  . Allergic rhinitis 06/09/2018  . Pressure injury of skin 06/05/2018  . MRSA bacteremia 06/04/2018  . Chronic generalized pain 05/16/2018  . Dry eye syndrome of both eyes 02/28/2018  . Abnormal EKG 01/24/2017  . Delusions ([place])   . Paranoia ([place])   . Vitamin D deficiency 08/25/2016  . Dementia with behavioral problem ([place]) 03/25/2016  . S/P ORIF (open reduction internal fixation) fracture 01/08/2016  . Oral thrush 01/08/2016  . HTN (hypertension) 01/08/2016  . Closed hip fracture requiring operative repair, right, with routine healing, subsequent encounter 12/19/2015  . Bipolar disease, chronic ([place]) 12/17/2015  . Decubitus ulcer of hip, stage 3 ([place]) 12/11/2015  . Legally blind 08/16/2015  . Ankle pain   . Encephalopathy   . Altered mental status 01/26/2015  . Nausea and vomiting 01/17/2015  . Esophageal dysmotilities   . Urinary tract infectious disease   . Weakness 01/14/2015  . Schatzki's ring 01/14/2015  . CKD (chronic kidney disease), stage III ([place]) 01/14/2015  . Esophagus disorder   . Dysphagia 01/11/2015  . Lower urinary tract infectious disease 03/30/2014  . Hearing loss, sensorineural 03/04/2014  . Nasal lesion 03/04/2014  . Constipation 12/12/2012  . Hypokalemia 12/11/2012  . Orthostatic hypotension 12/11/2012  . Arterial tortuosity (aorta)  12/11/2012  . Protein-calorie malnutrition, severe ([place]) 12/10/2012  . Adult failure to thrive 12/09/2012  . Decreased rectal sphincter tone 11/21/2012  . Primary open angle glaucoma of both eyes, severe stage 11/11/2012  . Idiopathic scoliosis 07/08/2012  . Macular atrophy, retinal 06/26/2011  . Vision disturbance 06/26/2011  . Glaucoma 06/26/2011  . Nonexudative senile macular degeneration of retina 06/21/2011  . Macula scar of posterior pole 06/21/2011  . Status post intraocular lens implant 06/21/2011  . Anemia of chronic disease 04/17/2011  . Drug-seeking behavior 02/26/2011  . Osteoporosis 02/26/2011  . Depression   . Anxiety   . COPD (chronic obstructive pulmonary disease) ([place])   . GERD (gastroesophageal reflux disease)   . Migraines   . Insomnia, unspecified     CMP     Component Value Date/Time   NA 141 03/27/2019 0000   NA 140 07/31/2017 0000   K 5.0 03/27/2019 0000   K 5.0 07/31/2017 0000   CL 105 03/27/2019 0000   CO2 26 (A) 03/27/2019 0000   GLUCOSE 89 06/08/2018 0317   BUN 28 (A) 03/27/2019 0000   CREATININE 0.7 03/27/2019 0000   CREATININE 0.70 06/08/2018 0317   CREATININE 0.62 07/31/2017 0000   CALCIUM 10.1 03/27/2019 0000   CALCIUM 8.9 07/31/2017 0000   PROT 5.7 (L) 06/05/2018 0320   PROT 5.8 07/31/2017 0000   ALBUMIN 3.7 03/27/2019 0000   ALBUMIN 3.5 07/31/2017 0000   AST 17 03/27/2019 0000   AST 14 07/31/2017 0000   ALT 10 03/27/2019 0000   ALT 5 07/31/2017 0000   ALKPHOS 93 03/27/2019 0000   ALKPHOS 87 07/31/2017 0000   BILITOT 0.5 06/05/2018 0320   BILITOT 0.3 07/31/2017 0000   GFRNONAA 72.39 03/27/2019 0000   GFRNONAA 81.51 07/31/2017 0000   GFRNONAA 64 11/09/2013 1135   GFRAA 83.90 03/27/2019 0000   GFRAA 74 11/09/2013 1135   Recent Labs    06/06/18 0646 06/06/18 0646 06/07/18 0317 06/07/18 0317 06/08/18 0317 06/08/18 1052 12/06/18 0000 01/28/19 0000 03/27/19 0000  NA 145   < > 144   < > 142   < > 144 141  141  K 2.4*   < >  2.9*   < > 2.4*   < > 4.9 3.9 5.0  CL 111   < > 114*   < > 106   < > 104 103 105  CO2 21*   < > 19*   < > 21*   < > 26* 28* 26*  GLUCOSE 100*  --  110*  --  89  --   --   --   --   BUN 15   < > 12   < > 11   < > 23* 22* 28*  CREATININE 0.67   < > 0.58   < > 0.70   < > 0.8 0.7 0.7  CALCIUM 8.5*   < > 8.7*   < > 9.0  --  9.6 9.5 10.1  MG  --   --   --   --  1.6*  --   --   --   --    < > = values in this interval not displayed.   Recent Labs    06/04/18 1121 06/04/18 1121 06/05/18 0320 09/02/18 0000 03/27/19 0000  AST 22   < > 22 17 17   ALT 16   < > 16 9 10   ALKPHOS 66   < > 59 98 93  BILITOT 0.5  --  0.5  --   --   PROT 5.9*  --  5.7*  --   --   ALBUMIN 2.8*  --  2.5*  --  3.7   < > = values in this interval not displayed.   Recent Labs    06/06/18 0646 06/06/18 0646 06/07/18 0317 06/07/18 0317 06/08/18 0317 06/23/18 0000 11/22/18 0000 01/28/19 0000 03/27/19 0000  WBC 8.6   < > 7.4   < > 7.6   < > 8.3 6.1 6.4  NEUTROABS  --   --   --   --   --    < > 6 4 3   HGB 11.3*   < > 11.9*   < > 12.8   < > 12.3 12.0 13.1  HCT 34.9*   < > 36.1   < > 38.8   < > 37 37 39  MCV 93.1  --  90.7  --  90.2  --   --   --   --   PLT 167   < > 205   < > 200   < > 234 227 218   < > = values in this interval not displayed.   No results for input(s): CHOL, LDLCALC, TRIG in the last 8760 hours.  Invalid input(s): HCL No results found for: [facility] Lab Results  Component Value Date   TSH 0.59 07/31/2017   Lab Results  Component Value Date   HGBA1C 5.4 06/11/2016   Lab Results  Component Value Date   CHOL 134 07/31/2017   HDL 54 06/11/2016   LDLCALC 61 07/31/2017   TRIG 102 07/31/2017    Significant Diagnostic Results in last 30 days:  No results found.  Assessment and Plan  No problem-specific Assessment & Plan notes found for this encounter.   Labs/tests ordered:    [name], MD

## 2019-05-10 ENCOUNTER — Encounter: Payer: Self-pay | Admitting: Internal Medicine

## 2019-05-12 NOTE — Progress Notes (Signed)
This encounter was created in error - please disregard.

## 2019-05-21 ENCOUNTER — Encounter: Payer: Self-pay | Admitting: Internal Medicine

## 2019-05-21 ENCOUNTER — Non-Acute Institutional Stay (SKILLED_NURSING_FACILITY): Payer: Medicare Other | Admitting: Internal Medicine

## 2019-05-21 DIAGNOSIS — M79605 Pain in left leg: Secondary | ICD-10-CM

## 2019-05-21 DIAGNOSIS — R600 Localized edema: Secondary | ICD-10-CM

## 2019-05-21 NOTE — Progress Notes (Signed)
Location:    [facility] Room Number: 304/D Place of Service:  SNF [phone]) Provider:  Henreitta Leber, MD  Patient Care Team: [name], MD as PCP - General (Internal Medicine) [name], MD (Gastroenterology) [name], NP (Obstetrics and Gynecology)  Extended Emergency Contact Information Primary Emergency Contact: [name],[name] Address: [place], [place] Phone: [phone] Mobile Phone: [phone] Relation: Daughter Secondary Emergency Contact: [place] Phone: [phone] Mobile Phone: [phone] Relation: Daughter  Code Status:  DNR Goals of care: Advanced Directive information Advanced Directives 05/21/2019  Does Patient Have a Medical Advance Directive? Yes  Type of Advance Directive Out of facility DNR (pink MOST or yellow form)  Does patient want to make changes to medical advance directive? No - Patient declined  Copy of [place] in Chart? -  Would patient like information on creating a medical advance directive? -  Pre-existing out of facility DNR order (yellow form or pink MOST form) Yellow form placed in chart (order not valid for inpatient use)     Chief Complaint  Patient presents with  . Follow-up    Left Leg Edema    HPI:  Pt is a 86 y.o. female seen today for an acute visit for follow-up of left leg pain and edema.  Patient is a long-term resident of the facility She has a history of dementia and bipolar disorder as well as COPD hypertension allergic rhinitis anemia.  Apparently last night she was noted to have some increased edema and pain of her left lower extremity.  X-rays of her leg and hip were ordered as well as a venous Doppler all these have come back negative for any fracture or thrombus.  She continues to have some pain of her left upper leg it appears more  so when she moves it when she is at rest she appears to be pretty comfortable.  There is also appears to be some edema this is cool to touch and not erythematous and this is more so of the upper left leg.  Currently she is resting in bed comfortably but when her left leg is moved she does complain of some pain.  Vital signs appear to be stable.  She is a poor historian secondary to dementia so it is somewhat difficult to truly localize the pain or source of discomfort.  Nursing does not report any other issues  Past Medical History:  Diagnosis Date  . Anxiety   . Arterial tortuosity (aorta) 12/11/2012  . Asthma   . Chronic kidney disease, stage IV (severe) ([place]) 12/11/2012  . Chronic mental illness   . CKD (chronic kidney disease), stage III 01/14/2015  . Closed hip fracture requiring operative repair, right, with routine healing, subsequent encounter 12/19/2015  . Colitis   . COPD (chronic obstructive pulmonary disease) ([place])   . Decreased rectal sphincter tone 11/21/2012  . Depression   . Drug-seeking behavior 02/26/2011   Use of multiple md to obtain benzos/narcotics pt must sign contract to receive controlled meds.   Marland Kitchen Dysphagia 01/11/2015  . GERD (gastroesophageal reflux disease)   . Glaucoma 06/26/2011  . Glaucoma of both eyes   . Hearing loss, sensorineural 03/04/2014   [facility]NT; 02/26/14. There is not a medical or surgical treatment that would benefit her type of hearing loss. We discussed amplification in  an overview fashion.    Marland Kitchen History of fall   . Insomnia, unspecified   . Macular degeneration 06/26/2011  . Migraines   . Nasal bone fracture 03/30/2014  . Osteoporosis   . Osteoporosis 02/26/2011  . Presence of right artificial hip joint   . Protein-calorie malnutrition, severe ([place]) 12/10/2012  . S/P ORIF (open reduction internal fixation) fracture 01/08/2016  . Schatzki's ring 01/14/2015  . Scoliosis   . Substance abuse ([place])    Benbzos and Narcotics, she does  not get any of those medicines from [facility], we have empathetically told her that.  . Vertigo   . Vitamin B deficiency   . Vitamin D deficiency 08/25/2016  . Weight loss    Past Surgical History:  Procedure Laterality Date  . ABDOMINAL HYSTERECTOMY    . CATARACT EXTRACTION    . CHOLECYSTECTOMY    . ESOPHAGOGASTRODUODENOSCOPY (EGD) WITH PROPOFOL N/A 01/13/2015   Procedure: ESOPHAGOGASTRODUODENOSCOPY (EGD) WITH PROPOFOL;  Surgeon: [name], MD;  Location: [facility];  Service: Endoscopy;  Laterality: N/A;  . HIP ARTHROPLASTY Right 12/21/2015   Procedure: ARTHROPLASTY BIPOLAR HIP (HEMIARTHROPLASTY);  Surgeon: [name], MD;  Location: [facility];  Service: Orthopedics;  Laterality: Right;  . PARTIAL HYSTERECTOMY      Allergies  Allergen Reactions  . Amoxicillin     Per daughter  . Ampicillin     Per daughter  . Depakote [Valproic Acid]     DOES NOT WANT HER TO TAKE IT==Makes her hair fall out= per daughter who is poa  . Macrobid [Nitrofurantoin Macrocrystal] Rash    Per MAR  . Penicillins     Per daughter  . Acetaminophen Other (See Comments)    jaundice  . Azithromycin Other (See Comments)    Per MAR  . Butalbital   . Caffeine   . Doxycycline Other (See Comments)    Per MAR  . Escitalopram Oxalate Other (See Comments)    Per MAR  . Fioricet [Butalbital-Apap-Caffeine] Other (See Comments)    Drunk. Per MAR  . Latex Other (See Comments)    Per MAR  . Levofloxacin Other (See Comments)    Per MAR  . Levofloxacin   . Nsaids Other (See Comments)    This is not an allergy. The patient was told by Dr. Rockne Menghini to avoid NSAIDs and stop Vicoprofen in to 2014 to avoid nephrotoxicity.  Per MAR.  Marland Kitchen Oxycodone Other (See Comments)    reaction to synthetic codeine Per MAR  . Restasis [Cyclosporine] Other (See Comments)    Per MAR  . Sulfa Antibiotics Other (See Comments)    Per MAR  . Biaxin [Clarithromycin] Rash    With burning sensation Per [facility]    Outpatient  Encounter Medications as of 05/21/2019  Medication Sig  . acetaminophen (TYLENOL) 325 MG tablet Take 650 mg by mouth every 6 (six) hours as needed for headache.  Marland Kitchen acetaminophen (TYLENOL) 500 MG tablet Give 2 tablets ( 1,000 mg total ) by mouth nightly for pain  . albuterol (PROVENTIL HFA;VENTOLIN HFA) 108 (90 BASE) MCG/ACT inhaler Inhale 1 puff into the lungs every 6 (six) hours as needed for shortness of breath.   Marland Kitchen ammonium lactate (LAC-HYDRIN) 12 % lotion Apply 1 application topically at bedtime as needed. Apply to bilateral feet for dry skin.  Marland Kitchen aspirin 81 MG chewable tablet Chew 81 mg by mouth daily.  . Biotin 5000 MCG CAPS Take 1 capsule by mouth daily.   . bisacodyl (DULCOLAX) 10 MG  suppository If not relieved by MOM, give 10 mg Bisacodyl suppositiory rectally X 1 dose in 24 hours as needed (Do not use constipation standing orders for residents with renal failure/CFR less than 30. Contact MD for orders) (Physician Order)  . Cholecalciferol (VITAMIN D3) 1.25 MG (50000 UT) CAPS Take 1 capsule by mouth once a week.  . citalopram (CELEXA) 10 MG tablet Take 10 mg by mouth daily.  . Cranberry 450 MG TABS Take 1 tablet by mouth 2 (two) times daily.   Marland Kitchen docusate sodium (COLACE) 100 MG capsule Take 100 mg by mouth 2 (two) times daily.  . dorzolamide-timolol (COSOPT) 22.3-6.8 MG/ML ophthalmic solution Place 1 drop into both eyes 2 (two) times daily.  Marland Kitchen ENSURE (ENSURE) Take 237 mLs by mouth 4 (four) times daily. D/t weight loss  . ferrous sulfate 325 (65 FE) MG tablet Take 325 mg by mouth daily.   . fludrocortisone (FLORINEF) 0.1 MG tablet Take 0.1 mg by mouth every morning. for orthostatic hypotension  . lactulose (CHRONULAC) 10 GM/15ML solution Take 15 mLs (10 g total) by mouth daily as needed for severe constipation.  Marland Kitchen loratadine (CLARITIN) 10 MG tablet Take 10 mg by mouth daily.   . magnesium hydroxide (MILK OF MAGNESIA) 400 MG/5ML suspension If no BM in 3 days, give 30 cc Milk of Magnesium p.o. x  1 dose in 24 hours as needed (Do not use standing constipation orders for residents with renal failure CFR less than 30. Contact MD for orders) (Physician Order)  . mirtazapine (REMERON) 15 MG tablet Take 7.5 mg by mouth at bedtime.  . nitroGLYCERIN (NITROSTAT) 0.4 MG SL tablet Place 0.4 mg under the tongue every 5 (five) minutes as needed for chest pain. May use 3 times  . Polyethyl Glycol-Propyl Glycol (SYSTANE) 0.4-0.3 % SOLN Place 2 drops into both eyes every 2 (two) hours as needed (dry eyes).   . polyethylene glycol (MIRALAX / GLYCOLAX) packet Take 17 g by mouth daily.   . potassium chloride (K-DUR) 20 MEQ tablet Take 1 tablet (20 mEq total) by mouth daily.  . QUEtiapine (SEROQUEL) 100 MG tablet Take 100 mg by mouth at bedtime. Take along with a 25 mg tablet to = 125 mg qhs  . QUEtiapine (SEROQUEL) 25 MG tablet Take 25 mg by mouth at bedtime. Take along with a 100 mg tablet to = 125 mg qhs  . senna (SENOKOT) 8.6 MG tablet Take 2 tablets by mouth 2 (two) times daily as needed for constipation.  . Sodium Phosphates (RA SALINE ENEMA RE) If not relieved by Biscodyl suppository, give disposable Saline Enema rectally X 1 dose/24 hrs as needed (Do not use constipation standing orders for residents with renal failure/CFR less than 30. Contact MD for orders)(Physician Or  . travoprost, benzalkonium, (TRAVATAN) 0.004 % ophthalmic solution Place 1 drop into both eyes at bedtime.  . vitamin B-12 (CYANOCOBALAMIN) 1000 MCG tablet Take 1,500 mcg by mouth daily. 1-1/2 tablets to = 1500 mcg   No facility-administered encounter medications on file as of 05/21/2019.    Review of Systems   Is unobtainable secondary to dementia please see HPI.    Immunization History  Administered Date(s) Administered  . Influenza Split 11/13/2011  . Influenza-Unspecified 02/13/2008, 11/30/2013, 12/20/2016  . Moderna SARS-COVID-2 Vaccination 02/12/2019, 03/12/2019  . PPD Test 12/15/2015  . Pneumococcal Conjugate-13  10/16/2016  . Pneumococcal-Unspecified 02/13/2008  . Td 02/12/2009   Pertinent  Health Maintenance Due  Topic Date Due  . INFLUENZA VACCINE  09/13/2019  .  DEXA SCAN  Completed  . PNA vac Low Risk Adult  Completed   Fall Risk  08/27/2017 08/22/2016  Falls in the past year? No Yes  Number falls in past yr: - 2 or more  Injury with Fall? - Yes  Comment - back of head   Functional Status Survey:    Vitals:   05/21/19 1529  BP: (!) 144/88  Pulse: 70  Resp: 18  Temp: 97.7 F (36.5 C)  TempSrc: Oral  SpO2: 94%  Weight: 66 lb 4.8 oz (30.1 kg)  Height: 4\' 11"  (1.499 m)   Body mass index is 13.39 kg/m. Physical Exam In general this is a somewhat frail elderly female who appears to be at her baseline lying comfortably in bed.  Her skin is warm and dry.  Eyes visual acuity appears to be intact she is legally blind sclera conjunctive appear to be clear.  Oropharynx is clear mucous membranes moist.  Chest is clear to auscultation there is no labored breathing.  Heart is regular rate and rhythm without murmur gallop or rub --only edema would be it appears more of the left upper leg this is cool nonerythematous nontender some of this may be more positional related the way she is lying in bed  Pedal pulses are intact bilaterally  Capillary refill is intact lower extremities bilaterally.  Abdomen is soft nontender with positive bowel sounds.  Musculoskeletal cannot really appreciate any deformity  some edema on her left upper leg that is cool nonerythematous-when her left leg is moved she does complain of pain this appears to be more in the upper leg hip area.  Neurologic she is alert could not really appreciate lateralizing findings her speech is clear.  Psych she is oriented to self only is able to follow some verbal commands.   Labs reviewed:  04/28/2019.  Calcium was noted to be 13.5  Sodium 145 potassium 3.5 BUN 41 creatinine 1.42.   Recent Labs    06/06/18 0646  06/06/18 0646 06/07/18 0317 06/07/18 0317 06/08/18 0317 06/08/18 1052 12/06/18 0000 01/28/19 0000 03/27/19 0000  NA 145   < > 144   < > 142   < > 144 141 141  K 2.4*   < > 2.9*   < > 2.4*   < > 4.9 3.9 5.0  CL 111   < > 114*   < > 106   < > 104 103 105  CO2 21*   < > 19*   < > 21*   < > 26* 28* 26*  GLUCOSE 100*  --  110*  --  89  --   --   --   --   BUN 15   < > 12   < > 11   < > 23* 22* 28*  CREATININE 0.67   < > 0.58   < > 0.70   < > 0.8 0.7 0.7  CALCIUM 8.5*   < > 8.7*   < > 9.0  --  9.6 9.5 10.1  MG  --   --   --   --  1.6*  --   --   --   --    < > = values in this interval not displayed.   Recent Labs    06/04/18 1121 06/04/18 1121 06/05/18 0320 09/02/18 0000 03/27/19 0000  AST 22   < > 22 17 17   ALT 16   < > 16 9 10   ALKPHOS 66   < >  59 98 93  BILITOT 0.5  --  0.5  --   --   PROT 5.9*  --  5.7*  --   --   ALBUMIN 2.8*  --  2.5*  --  3.7   < > = values in this interval not displayed.   Recent Labs    06/06/18 0646 06/06/18 0646 06/07/18 0317 06/07/18 0317 06/08/18 0317 06/23/18 0000 11/22/18 0000 01/28/19 0000 03/27/19 0000  WBC 8.6   < > 7.4   < > 7.6   < > 8.3 6.1 6.4  NEUTROABS  --   --   --   --   --    < > 6 4 3   HGB 11.3*   < > 11.9*   < > 12.8   < > 12.3 12.0 13.1  HCT 34.9*   < > 36.1   < > 38.8   < > 37 37 39  MCV 93.1  --  90.7  --  90.2  --   --   --   --   PLT 167   < > 205   < > 200   < > 234 227 218   < > = values in this interval not displayed.   Lab Results  Component Value Date   TSH 0.59 07/31/2017   Lab Results  Component Value Date   HGBA1C 5.4 06/11/2016   Lab Results  Component Value Date   CHOL 134 07/31/2017   HDL 54 06/11/2016   LDLCALC 61 07/31/2017   TRIG 102 07/31/2017    Significant Diagnostic Results in last 30 days:  No results found.  Assessment/Plan  #1 history of left leg hip discomfort-x-rays of the area did not show any acute process fracture it did show some degenerative changes of the left hip  area.  Venous Doppler was negative for any DVT.  Her pain appears to be more so with movement and repositioning-Per nursing she was up in the wheelchair today and did fairly well.  She does have Tylenol for pain she has numerous drug allergies which makes one somewhat hesitant to be real aggressive here.  For concerns of side effects or reactions.  At one point considered a muscle relaxer but at this point will wait and see how she does if this is more transitory pain or persists.  I have advised nursing to carefully monitor this and certainly let us know if it continues.  Cannot rule out possibly muscle related--if persist consider fixed sight  x-ray or other study-however I suspect transferring patient outside of facility for this may be somewhat problematic Per previous visit by Dr. [name] it appears emphasis is on comfort regards to patient's care.  [id number]   Of note greater than 25 minutes spent assessing patient-reviewing her chart-investigational studies as well as discussing her status with nursing staff-and coordinating a plan of care-of note greater than 50% of time spent coordinating a plan of care with input as noted above

## 2019-05-22 ENCOUNTER — Encounter: Payer: Self-pay | Admitting: Internal Medicine

## 2019-05-27 ENCOUNTER — Encounter: Payer: Self-pay | Admitting: Internal Medicine

## 2019-05-27 NOTE — Progress Notes (Signed)
Location:    [facility] Room Number: 304/D Place of Service:  SNF (31) Provider:  Leda Min, MD  Patient Care Team: [name], MD as PCP - General (Internal Medicine) [name], MD (Gastroenterology) [name], NP (Obstetrics and Gynecology)  Extended Emergency Contact Information Primary Emergency Contact: [name],[name] Address: [place], [place] Phone: [phone] Mobile Phone: [phone] Relation: Daughter Secondary Emergency Contact: [place] Phone: [phone] Mobile Phone: [phone] Relation: Daughter  Code Status:  DNR Goals of care: Advanced Directive information Advanced Directives 05/27/2019  Does Patient Have a Medical Advance Directive? Yes  Type of Advance Directive Out of facility DNR (pink MOST or yellow form)  Does patient want to make changes to medical advance directive? No - Patient declined  Copy of [place] in Chart? -  Would patient like information on creating a medical advance directive? -  Pre-existing out of facility DNR order (yellow form or pink MOST form) Yellow form placed in chart (order not valid for inpatient use)     Chief Complaint  Patient presents with  . Medical Management of Chronic Issues    Routine visit of medical management  . Best Practice Recommendations    T-Dap    HPI:  Pt is a 86 y.o. female seen today for medical management of chronic diseases.     Past Medical History:  Diagnosis Date  . Anxiety   . Arterial tortuosity (aorta) 12/11/2012  . Asthma   . Chronic kidney disease, stage IV (severe) ([place]) 12/11/2012  . Chronic mental illness   . CKD (chronic kidney disease), stage III 01/14/2015  . Closed hip fracture requiring operative repair, right, with routine healing, subsequent encounter 12/19/2015  . Colitis   .  COPD (chronic obstructive pulmonary disease) ([place])   . Decreased rectal sphincter tone 11/21/2012  . Depression   . Drug-seeking behavior 02/26/2011   Use of multiple md to obtain benzos/narcotics pt must sign contract to receive controlled meds.   Marland Kitchen Dysphagia 01/11/2015  . GERD (gastroesophageal reflux disease)   . Glaucoma 06/26/2011  . Glaucoma of both eyes   . Hearing loss, sensorineural 03/04/2014   [facility]; 02/26/14. There is not a medical or surgical treatment that would benefit her type of hearing loss. We discussed amplification in an overview fashion.    Marland Kitchen History of fall   . Insomnia, unspecified   . Macular degeneration 06/26/2011  . Migraines   . Nasal bone fracture 03/30/2014  . Osteoporosis   . Osteoporosis 02/26/2011  . Presence of right artificial hip joint   . Protein-calorie malnutrition, severe ([place]) 12/10/2012  . S/P ORIF (open reduction internal fixation) fracture 01/08/2016  . Schatzki's ring 01/14/2015  . Scoliosis   . Substance abuse ([place])    Benbzos and Narcotics, she does not get any of those medicines from [facility], we have empathetically told her that.  . Vertigo   . Vitamin B deficiency   . Vitamin D deficiency 08/25/2016  . Weight loss    Past Surgical History:  Procedure Laterality Date  . ABDOMINAL HYSTERECTOMY    . CATARACT EXTRACTION    . CHOLECYSTECTOMY    . ESOPHAGOGASTRODUODENOSCOPY (EGD) WITH PROPOFOL N/A 01/13/2015   Procedure: ESOPHAGOGASTRODUODENOSCOPY (EGD) WITH PROPOFOL;  Surgeon: [name], MD;  Location: [facility];  Service: Endoscopy;  Laterality: N/A;  . HIP ARTHROPLASTY Right 12/21/2015   Procedure: ARTHROPLASTY BIPOLAR HIP (HEMIARTHROPLASTY);  Surgeon: [name], MD;  Location: [facility];  Service: Orthopedics;  Laterality: Right;  . PARTIAL HYSTERECTOMY      Allergies  Allergen Reactions  . Amoxicillin     Per daughter  . Ampicillin     Per daughter  . Depakote [Valproic Acid]     DOES NOT WANT HER TO TAKE  IT==Makes her hair fall out= per daughter who is poa  . Macrobid [Nitrofurantoin Macrocrystal] Rash    Per MAR  . Penicillins     Per daughter  . Acetaminophen Other (See Comments)    jaundice  . Azithromycin Other (See Comments)    Per MAR  . Butalbital   . Caffeine   . Doxycycline Other (See Comments)    Per MAR  . Escitalopram Oxalate Other (See Comments)    Per MAR  . Fioricet [Butalbital-Apap-Caffeine] Other (See Comments)    Drunk. Per MAR  . Latex Other (See Comments)    Per MAR  . Levofloxacin Other (See Comments)    Per MAR  . Levofloxacin   . Nsaids Other (See Comments)    This is not an allergy. The patient was told by Dr. Rockne Menghini to avoid NSAIDs and stop Vicoprofen in to 2014 to avoid nephrotoxicity.  Per MAR.  Marland Kitchen Oxycodone Other (See Comments)    reaction to synthetic codeine Per MAR  . Restasis [Cyclosporine] Other (See Comments)    Per MAR  . Sulfa Antibiotics Other (See Comments)    Per MAR  . Biaxin [Clarithromycin] Rash    With burning sensation Per MAR    Allergies as of 05/27/2019      Reactions   Amoxicillin    Per daughter   Ampicillin    Per daughter   Depakote [valproic Acid]    DOES NOT WANT HER TO TAKE IT==Makes her hair fall out= per daughter who is poa   Macrobid [nitrofurantoin Macrocrystal] Rash   Per MAR   Penicillins    Per daughter   Acetaminophen Other (See Comments)   jaundice   Azithromycin Other (See Comments)   Per MAR   Butalbital    Caffeine    Doxycycline Other (See Comments)   Per MAR   Escitalopram Oxalate Other (See Comments)   Per MAR   Fioricet [butalbital-apap-caffeine] Other (See Comments)   Drunk. Per MAR   Latex Other (See Comments)   Per MAR   Levofloxacin Other (See Comments)   Per MAR   Levofloxacin    Nsaids Other (See Comments)   This is not an allergy. The patient was told by Dr. Rockne Menghini to avoid NSAIDs and stop Vicoprofen in to 2014 to avoid nephrotoxicity. Per MAR.   Oxycodone Other (See  Comments)   reaction to synthetic codeine Per MAR   Restasis [cyclosporine] Other (See Comments)   Per MAR   Sulfa Antibiotics Other (See Comments)   Per MAR   Biaxin [clarithromycin] Rash   With burning sensation Per [facility]      Medication List       Accurate as of May 27, 2019 10:36 AM. If you have any questions, ask your nurse or doctor.        acetaminophen 325 MG tablet Commonly known as: TYLENOL Take 650 mg by mouth every 6 (six) hours as needed for headache.   acetaminophen 500 MG tablet Commonly known as: TYLENOL Give 2 tablets ( 1,000 mg  total ) by mouth nightly for pain   albuterol 108 (90 Base) MCG/ACT inhaler Commonly known as: VENTOLIN HFA Inhale 1 puff into the lungs every 6 (six) hours as needed for shortness of breath.   ammonium lactate 12 % lotion Commonly known as: LAC-HYDRIN Apply 1 application topically at bedtime as needed. Apply to bilateral feet for dry skin.   aspirin 81 MG chewable tablet Chew 81 mg by mouth daily.   Biotin 5000 MCG Caps Take 1 capsule by mouth daily.   bisacodyl 10 MG suppository Commonly known as: DULCOLAX If not relieved by MOM, give 10 mg Bisacodyl suppositiory rectally X 1 dose in 24 hours as needed (Do not use constipation standing orders for residents with renal failure/CFR less than 30. Contact MD for orders) (Physician Order)   citalopram 10 MG tablet Commonly known as: CELEXA Take 10 mg by mouth daily.   Claritin 10 MG tablet Generic drug: loratadine Take 10 mg by mouth daily.   Cranberry 450 MG Tabs Take 1 tablet by mouth 2 (two) times daily.   docusate sodium 100 MG capsule Commonly known as: COLACE Take 100 mg by mouth 2 (two) times daily.   dorzolamide-timolol 22.3-6.8 MG/ML ophthalmic solution Commonly known as: COSOPT Place 1 drop into both eyes 2 (two) times daily.   Ensure Take 237 mLs by mouth 4 (four) times daily. D/t weight loss   ferrous sulfate 325 (65 FE) MG tablet Take 325 mg by  mouth daily.   fludrocortisone 0.1 MG tablet Commonly known as: FLORINEF Take 0.1 mg by mouth every morning. for orthostatic hypotension   lactulose 10 GM/15ML solution Commonly known as: CHRONULAC Take 15 mLs (10 g total) by mouth daily as needed for severe constipation.   magnesium hydroxide 400 MG/5ML suspension Commonly known as: MILK OF MAGNESIA If no BM in 3 days, give 30 cc Milk of Magnesium p.o. x 1 dose in 24 hours as needed (Do not use standing constipation orders for residents with renal failure CFR less than 30. Contact MD for orders) (Physician Order)   mirtazapine 15 MG tablet Commonly known as: REMERON Take 7.5 mg by mouth at bedtime.   nitroGLYCERIN 0.4 MG SL tablet Commonly known as: NITROSTAT Place 0.4 mg under the tongue every 5 (five) minutes as needed for chest pain. May use 3 times   NON FORMULARY Diet: Pureed Diet, Honey Thickened Liquids - to be given by spoon.   polyethylene glycol 17 g packet Commonly known as: MIRALAX / GLYCOLAX Take 17 g by mouth daily.   potassium chloride SA 20 MEQ tablet Commonly known as: KLOR-CON Take 1 tablet (20 mEq total) by mouth daily.   QUEtiapine 100 MG tablet Commonly known as: SEROQUEL Take 100 mg by mouth at bedtime. Take along with a 25 mg tablet to = 125 mg qhs   QUEtiapine 25 MG tablet Commonly known as: SEROQUEL Take 25 mg by mouth at bedtime. Take along with a 100 mg tablet to = 125 mg qhs   RA SALINE ENEMA RE If not relieved by Biscodyl suppository, give disposable Saline Enema rectally X 1 dose/24 hrs as needed (Do not use constipation standing orders for residents with renal failure/CFR less than 30. Contact MD for orders)(Physician Or   senna 8.6 MG tablet Commonly known as: SENOKOT Take 2 tablets by mouth 2 (two) times daily as needed for constipation.   Systane 0.4-0.3 % Soln Generic drug: Polyethyl Glycol-Propyl Glycol Place 2 drops into both eyes every 2 (two) hours  as needed (dry eyes).    travoprost (benzalkonium) 0.004 % ophthalmic solution Commonly known as: TRAVATAN Place 1 drop into both eyes at bedtime.   vitamin B-12 1000 MCG tablet Commonly known as: CYANOCOBALAMIN Take 1,500 mcg by mouth daily. 1-1/2 tablets to = 1500 mcg   Vitamin D3 1.25 MG (50000 UT) Caps Take 1 capsule by mouth once a week.       Review of Systems  Immunization History  Administered Date(s) Administered  . Influenza Split 11/13/2011  . Influenza-Unspecified 02/13/2008, 11/30/2013, 12/20/2016  . Moderna SARS-COVID-2 Vaccination 02/12/2019, 03/12/2019  . PPD Test 12/15/2015  . Pneumococcal Conjugate-13 10/16/2016  . Pneumococcal-Unspecified 02/13/2008  . Td 02/12/2009   Pertinent  Health Maintenance Due  Topic Date Due  . INFLUENZA VACCINE  09/13/2019  . DEXA SCAN  Completed  . PNA vac Low Risk Adult  Completed   Fall Risk  08/27/2017 08/22/2016  Falls in the past year? No Yes  Number falls in past yr: - 2 or more  Injury with Fall? - Yes  Comment - back of head   Functional Status Survey:    Vitals:   05/27/19 1019  BP: (!) 98/53  Pulse: 60  Resp: 18  Temp: 98.1 F (36.7 C)  TempSrc: Oral  SpO2: 94%  Weight: 66 lb 4.8 oz (30.1 kg)  Height: 4\' 11"  (1.499 m)   Body mass index is 13.39 kg/m. Physical Exam  Labs reviewed: Recent Labs    06/06/18 0646 06/06/18 0646 06/07/18 0317 06/07/18 0317 06/08/18 0317 06/08/18 1052 12/06/18 0000 01/28/19 0000 03/27/19 0000  NA 145   < > 144   < > 142   < > 144 141 141  K 2.4*   < > 2.9*   < > 2.4*   < > 4.9 3.9 5.0  CL 111   < > 114*   < > 106   < > 104 103 105  CO2 21*   < > 19*   < > 21*   < > 26* 28* 26*  GLUCOSE 100*  --  110*  --  89  --   --   --   --   BUN 15   < > 12   < > 11   < > 23* 22* 28*  CREATININE 0.67   < > 0.58   < > 0.70   < > 0.8 0.7 0.7  CALCIUM 8.5*   < > 8.7*   < > 9.0  --  9.6 9.5 10.1  MG  --   --   --   --  1.6*  --   --   --   --    < > = values in this interval not displayed.   Recent  Labs    06/04/18 1121 06/04/18 1121 06/05/18 0320 09/02/18 0000 03/27/19 0000  AST 22   < > 22 17 17   ALT 16   < > 16 9 10   ALKPHOS 66   < > 59 98 93  BILITOT 0.5  --  0.5  --   --   PROT 5.9*  --  5.7*  --   --   ALBUMIN 2.8*  --  2.5*  --  3.7   < > = values in this interval not displayed.   Recent Labs    06/06/18 0646 06/06/18 0646 06/07/18 0317 06/07/18 0317 06/08/18 0317 06/23/18 0000 11/22/18 0000 01/28/19 0000 03/27/19 0000  WBC 8.6   < > 7.4   < >  7.6   < > 8.3 6.1 6.4  NEUTROABS  --   --   --   --   --    < > 6 4 3   HGB 11.3*   < > 11.9*   < > 12.8   < > 12.3 12.0 13.1  HCT 34.9*   < > 36.1   < > 38.8   < > 37 37 39  MCV 93.1  --  90.7  --  90.2  --   --   --   --   PLT 167   < > 205   < > 200   < > 234 227 218   < > = values in this interval not displayed.   Lab Results  Component Value Date   TSH 0.59 07/31/2017   Lab Results  Component Value Date   HGBA1C 5.4 06/11/2016   Lab Results  Component Value Date   CHOL 134 07/31/2017   HDL 54 06/11/2016   LDLCALC 61 07/31/2017   TRIG 102 07/31/2017    Significant Diagnostic Results in last 30 days:  No results found.  Assessment/Plan There are no diagnoses linked to this encounter.   Family/ staff Communication:   Labs/tests ordered:      This encounter was created in error - please disregard.

## 2019-05-28 ENCOUNTER — Non-Acute Institutional Stay (SKILLED_NURSING_FACILITY): Payer: Medicare Other | Admitting: Internal Medicine

## 2019-05-28 ENCOUNTER — Encounter: Payer: Self-pay | Admitting: Internal Medicine

## 2019-05-28 DIAGNOSIS — F0390 Unspecified dementia without behavioral disturbance: Secondary | ICD-10-CM

## 2019-05-28 DIAGNOSIS — I951 Orthostatic hypotension: Secondary | ICD-10-CM

## 2019-05-28 DIAGNOSIS — D638 Anemia in other chronic diseases classified elsewhere: Secondary | ICD-10-CM

## 2019-05-28 DIAGNOSIS — J449 Chronic obstructive pulmonary disease, unspecified: Secondary | ICD-10-CM

## 2019-05-28 DIAGNOSIS — F03C0 Unspecified dementia, severe, without behavioral disturbance, psychotic disturbance, mood disturbance, and anxiety: Secondary | ICD-10-CM

## 2019-05-28 NOTE — Progress Notes (Signed)
Location:    [facility] Room Number: 304/D Place of Service:  SNF (31) Provider:  Leda Min, MD  Patient Care Team: [name], MD as PCP - General (Internal Medicine) [name], MD (Gastroenterology) [name], NP (Obstetrics and Gynecology)  Extended Emergency Contact Information Primary Emergency Contact: [name],[name] Address: [place], [place] Phone: [phone] Mobile Phone: [phone] Relation: Daughter Secondary Emergency Contact: [place] Phone: [phone] Mobile Phone: [phone] Relation: Daughter  Code Status:  DNR Goals of care: Advanced Directive information Advanced Directives 05/28/2019  Does Patient Have a Medical Advance Directive? Yes  Type of Advance Directive Out of facility DNR (pink MOST or yellow form)  Does patient want to make changes to medical advance directive? No - Patient declined  Copy of [place] in Chart? -  Would patient like information on creating a medical advance directive? -  Pre-existing out of facility DNR order (yellow form or pink MOST form) Yellow form placed in chart (order not valid for inpatient use)    Chief complaint-routine visit for medical management of chronic medical conditions including history of dementia-bipolar disorder-hypertension-COPD-allergic rhinitis-anemia  HPI:  Pt is a 86 y.o. female seen today for medical management of chronic diseases.  As noted above.  Most acute issue recently with some left hip upper leg edema and pain-venous Doppler was negative for a clot-x-rays basically showed degenerative disease.  Per speaking with the therapist today this has improved somewhat the pain was more with movemen--t she still has some discomfort but appear to be more comfortable than when I saw her last time.  There is  suspicion that it may be muscle related the patient has numerous allergies and there are concerns about using a muscle relaxer --she does have orders for Tylenol.  Regards to dementia with bipolar disorder she continues on Seroquel 125 mg nightly-at time apparently she is somewhat noncompliant but I do not believe this is been a huge issue for staff.  She does have some history of poor appetite she is on Remeron 7.5 mg nightly update weight is pending for follow-up.  She also had a recent lab which showed her calcium significantly elevated at 13.5.  This was concerning for malignancy and would explain weight loss as well.  It appears emphasis is is on comfort per daughter's discussion with Dr. [name].  Currently she appears comfortable lying in bed.  Again the pain in her left hip will have to be monitored this is complicated again with her numerous allergies.  She does have a previous history of skin picking behaviors her Prozac was switched to Celebrex left secondary to pharmacy recommendation-cost issues with insurance this appears to be stable.  -She does have a history of anemia but this appears stable her hemoglobin is 13.1 on the lab done in in February.  She also has a history of hypotension and is on Florinef in the morning blood pressures appear relatively stable recently 136/84-139/86-146/77.  I see 198/53 but this appears relatively rare.        Past Medical History:  Diagnosis Date  . Anxiety   . Arterial tortuosity (aorta) 12/11/2012  . Asthma   . Chronic kidney disease, stage IV (severe) ([place]) 12/11/2012  . Chronic mental illness   . CKD (chronic kidney disease), stage III 01/14/2015  .  Closed hip fracture requiring operative repair, right, with routine healing, subsequent encounter 12/19/2015  . Colitis   . COPD (chronic obstructive pulmonary disease) ([place])   . Decreased rectal sphincter tone 11/21/2012  . Depression   . Drug-seeking behavior 02/26/2011    Use of multiple md to obtain benzos/narcotics pt must sign contract to receive controlled meds.   Marland Kitchen Dysphagia 01/11/2015  . GERD (gastroesophageal reflux disease)   . Glaucoma 06/26/2011  . Glaucoma of both eyes   . Hearing loss, sensorineural 03/04/2014   [facility]NT; 02/26/14. There is not a medical or surgical treatment that would benefit her type of hearing loss. We discussed amplification in an overview fashion.    Marland Kitchen History of fall   . Insomnia, unspecified   . Macular degeneration 06/26/2011  . Migraines   . Nasal bone fracture 03/30/2014  . Osteoporosis   . Osteoporosis 02/26/2011  . Presence of right artificial hip joint   . Protein-calorie malnutrition, severe ([place]) 12/10/2012  . S/P ORIF (open reduction internal fixation) fracture 01/08/2016  . Schatzki's ring 01/14/2015  . Scoliosis   . Substance abuse ([place])    Benbzos and Narcotics, she does not get any of those medicines from [facility], we have empathetically told her that.  . Vertigo   . Vitamin B deficiency   . Vitamin D deficiency 08/25/2016  . Weight loss    Past Surgical History:  Procedure Laterality Date  . ABDOMINAL HYSTERECTOMY    . CATARACT EXTRACTION    . CHOLECYSTECTOMY    . ESOPHAGOGASTRODUODENOSCOPY (EGD) WITH PROPOFOL N/A 01/13/2015   Procedure: ESOPHAGOGASTRODUODENOSCOPY (EGD) WITH PROPOFOL;  Surgeon: [name], MD;  Location: [facility];  Service: Endoscopy;  Laterality: N/A;  . HIP ARTHROPLASTY Right 12/21/2015   Procedure: ARTHROPLASTY BIPOLAR HIP (HEMIARTHROPLASTY);  Surgeon: [name], MD;  Location: [facility];  Service: Orthopedics;  Laterality: Right;  . PARTIAL HYSTERECTOMY      Allergies  Allergen Reactions  . Amoxicillin     Per daughter  . Ampicillin     Per daughter  . Depakote [Valproic Acid]     DOES NOT WANT HER TO TAKE IT==Makes her hair fall out= per daughter who is poa  . Macrobid [Nitrofurantoin Macrocrystal] Rash    Per MAR  . Penicillins     Per daughter  .  Acetaminophen Other (See Comments)    jaundice  . Azithromycin Other (See Comments)    Per MAR  . Butalbital   . Caffeine   . Doxycycline Other (See Comments)    Per MAR  . Escitalopram Oxalate Other (See Comments)    Per MAR  . Fioricet [Butalbital-Apap-Caffeine] Other (See Comments)    Drunk. Per MAR  . Latex Other (See Comments)    Per MAR  . Levofloxacin Other (See Comments)    Per MAR  . Levofloxacin   . Nsaids Other (See Comments)    This is not an allergy. The patient was told by Dr. Rockne Menghini to avoid NSAIDs and stop Vicoprofen in to 2014 to avoid nephrotoxicity.  Per MAR.  Marland Kitchen Oxycodone Other (See Comments)    reaction to synthetic codeine Per MAR  . Restasis [Cyclosporine] Other (See Comments)    Per MAR  . Sulfa Antibiotics Other (See Comments)    Per MAR  . Biaxin [Clarithromycin] Rash    With burning sensation Per MAR    Allergies as of 05/28/2019      Reactions   Amoxicillin    Per daughter  Ampicillin    Per daughter   Depakote [valproic Acid]    DOES NOT WANT HER TO TAKE IT==Makes her hair fall out= per daughter who is poa   Macrobid [nitrofurantoin Macrocrystal] Rash   Per MAR   Penicillins    Per daughter   Acetaminophen Other (See Comments)   jaundice   Azithromycin Other (See Comments)   Per MAR   Butalbital    Caffeine    Doxycycline Other (See Comments)   Per MAR   Escitalopram Oxalate Other (See Comments)   Per MAR   Fioricet [butalbital-apap-caffeine] Other (See Comments)   Drunk. Per MAR   Latex Other (See Comments)   Per MAR   Levofloxacin Other (See Comments)   Per MAR   Levofloxacin    Nsaids Other (See Comments)   This is not an allergy. The patient was told by Dr. Rockne Menghini to avoid NSAIDs and stop Vicoprofen in to 2014 to avoid nephrotoxicity. Per MAR.   Oxycodone Other (See Comments)   reaction to synthetic codeine Per MAR   Restasis [cyclosporine] Other (See Comments)   Per MAR   Sulfa Antibiotics Other (See Comments)   Per  MAR   Biaxin [clarithromycin] Rash   With burning sensation Per [facility]      Medication List       Accurate as of May 28, 2019  9:47 AM. If you have any questions, ask your nurse or doctor.        acetaminophen 325 MG tablet Commonly known as: TYLENOL Take 650 mg by mouth every 6 (six) hours as needed for headache.   acetaminophen 500 MG tablet Commonly known as: TYLENOL Give 2 tablets ( 1,000 mg total ) by mouth nightly for pain   albuterol 108 (90 Base) MCG/ACT inhaler Commonly known as: VENTOLIN HFA Inhale 1 puff into the lungs every 6 (six) hours as needed for shortness of breath.   ammonium lactate 12 % lotion Commonly known as: LAC-HYDRIN Apply 1 application topically at bedtime as needed. Apply to bilateral feet for dry skin.   aspirin 81 MG chewable tablet Chew 81 mg by mouth daily.   Biotin 5000 MCG Caps Take 1 capsule by mouth daily.   bisacodyl 10 MG suppository Commonly known as: DULCOLAX If not relieved by MOM, give 10 mg Bisacodyl suppositiory rectally X 1 dose in 24 hours as needed (Do not use constipation standing orders for residents with renal failure/CFR less than 30. Contact MD for orders) (Physician Order)   citalopram 10 MG tablet Commonly known as: CELEXA Take 10 mg by mouth daily.   Claritin 10 MG tablet Generic drug: loratadine Take 10 mg by mouth daily.   Cranberry 450 MG Tabs Take 1 tablet by mouth 2 (two) times daily.   docusate sodium 100 MG capsule Commonly known as: COLACE Take 100 mg by mouth 2 (two) times daily.   dorzolamide-timolol 22.3-6.8 MG/ML ophthalmic solution Commonly known as: COSOPT Place 1 drop into both eyes 2 (two) times daily.   Ensure Take 237 mLs by mouth 4 (four) times daily. D/t weight loss   ferrous sulfate 325 (65 FE) MG tablet Take 325 mg by mouth daily.   fludrocortisone 0.1 MG tablet Commonly known as: FLORINEF Take 0.1 mg by mouth every morning. for orthostatic hypotension   lactulose 10  GM/15ML solution Commonly known as: CHRONULAC Take 15 mLs (10 g total) by mouth daily as needed for severe constipation.   magnesium hydroxide 400 MG/5ML suspension Commonly known as: MILK  OF MAGNESIA If no BM in 3 days, give 30 cc Milk of Magnesium p.o. x 1 dose in 24 hours as needed (Do not use standing constipation orders for residents with renal failure CFR less than 30. Contact MD for orders) (Physician Order)   mirtazapine 15 MG tablet Commonly known as: REMERON Take 7.5 mg by mouth at bedtime.   nitroGLYCERIN 0.4 MG SL tablet Commonly known as: NITROSTAT Place 0.4 mg under the tongue every 5 (five) minutes as needed for chest pain. May use 3 times   NON FORMULARY Diet: Pureed Diet, Honey Thickened Liquids - to be given by spoon.   polyethylene glycol 17 g packet Commonly known as: MIRALAX / GLYCOLAX Take 17 g by mouth daily.   potassium chloride SA 20 MEQ tablet Commonly known as: KLOR-CON Take 1 tablet (20 mEq total) by mouth daily.   QUEtiapine 100 MG tablet Commonly known as: SEROQUEL Take 100 mg by mouth at bedtime. Take along with a 25 mg tablet to = 125 mg qhs   QUEtiapine 25 MG tablet Commonly known as: SEROQUEL Take 25 mg by mouth at bedtime. Take along with a 100 mg tablet to = 125 mg qhs   RA SALINE ENEMA RE If not relieved by Biscodyl suppository, give disposable Saline Enema rectally X 1 dose/24 hrs as needed (Do not use constipation standing orders for residents with renal failure/CFR less than 30. Contact MD for orders)(Physician Or   senna 8.6 MG tablet Commonly known as: SENOKOT Take 2 tablets by mouth 2 (two) times daily as needed for constipation.   Systane 0.4-0.3 % Soln Generic drug: Polyethyl Glycol-Propyl Glycol Place 2 drops into both eyes every 2 (two) hours as needed (dry eyes).   travoprost (benzalkonium) 0.004 % ophthalmic solution Commonly known as: TRAVATAN Place 1 drop into both eyes at bedtime.   vitamin B-12 1000 MCG  tablet Commonly known as: CYANOCOBALAMIN Take 1,500 mcg by mouth daily. 1-1/2 tablets to = 1500 mcg   Vitamin D3 1.25 MG (50000 UT) Caps Take 1 capsule by mouth once a week.       Review of Systems This is limited secondary to dementia please see HPI Immunization History  Administered Date(s) Administered  . Influenza Split 11/13/2011  . Influenza-Unspecified 02/13/2008, 11/30/2013, 12/20/2016  . Moderna SARS-COVID-2 Vaccination 02/12/2019, 03/12/2019  . PPD Test 12/15/2015  . Pneumococcal Conjugate-13 10/16/2016  . Pneumococcal-Unspecified 02/13/2008  . Td 02/12/2009   Pertinent  Health Maintenance Due  Topic Date Due  . INFLUENZA VACCINE  09/13/2019  . DEXA SCAN  Completed  . PNA vac Low Risk Adult  Completed   Fall Risk  08/27/2017 08/22/2016  Falls in the past year? No Yes  Number falls in past yr: - 2 or more  Injury with Fall? - Yes  Comment - back of head   Functional Status Survey:    Vitals:   05/28/19 0945  BP: 136/84  Pulse: 78  Resp: 18  Temp: (!) 97.3 F (36.3 C)  TempSrc: Oral  SpO2: 94%  Weight: 66 lb 4.8 oz (30.1 kg)  Height: 4\' 11"  (1.499 m)   Body mass index is 13.39 kg/m. Physical Exam In general this is a frail-appearing elderly female in no distress she is lying comfortably in bed at this time.  Her skin is warm and dry.  Eyes she is legally blind sclera and conjunctive appear to be clear.  Oropharynx appears clear mucous membranes moist.  Chest is clear to auscultation there is no  labored breathing.  Heart is regular rate and rhythm without murmur gallop or rub she does not have significant lower extremity edema.  Abdomen is soft nontender with positive bowel sounds.  Musculoskeletal moves her extremities at baseline with some flexion at the left leg there is still some discomfort but this appears to be improved compared to previous exam.  There is some slight "edema upper leg present this does not appear to be tender or warm to  touch or erythematous  Neurologic is grossly intact her speech is clear   Psych she is oriented to self she does follow simple verbal commands-she is not agitated with exam   Labs reviewed:  April 28, 2019.  Sodium 145 potassium 3.5 BUN 41 creatinine 1.42.  Calcium was 13.5.  March 27, 2019.  Sodium 141 potassium 5 BUN 28 creatinine 0.7.  March 27, 2019.  Liver function tests within normal limits.  WBC 6.4 hemoglobin 13.1 platelets 218.   Recent Labs    06/06/18 0646 06/06/18 0646 06/07/18 0317 06/07/18 0317 06/08/18 0317 06/08/18 1052 12/06/18 0000 01/28/19 0000 03/27/19 0000  NA 145   < > 144   < > 142   < > 144 141 141  K 2.4*   < > 2.9*   < > 2.4*   < > 4.9 3.9 5.0  CL 111   < > 114*   < > 106   < > 104 103 105  CO2 21*   < > 19*   < > 21*   < > 26* 28* 26*  GLUCOSE 100*  --  110*  --  89  --   --   --   --   BUN 15   < > 12   < > 11   < > 23* 22* 28*  CREATININE 0.67   < > 0.58   < > 0.70   < > 0.8 0.7 0.7  CALCIUM 8.5*   < > 8.7*   < > 9.0  --  9.6 9.5 10.1  MG  --   --   --   --  1.6*  --   --   --   --    < > = values in this interval not displayed.   Recent Labs    06/04/18 1121 06/04/18 1121 06/05/18 0320 09/02/18 0000 03/27/19 0000  AST 22   < > 22 17 17   ALT 16   < > 16 9 10   ALKPHOS 66   < > 59 98 93  BILITOT 0.5  --  0.5  --   --   PROT 5.9*  --  5.7*  --   --   ALBUMIN 2.8*  --  2.5*  --  3.7   < > = values in this interval not displayed.   Recent Labs    06/06/18 0646 06/06/18 0646 06/07/18 0317 06/07/18 0317 06/08/18 0317 06/23/18 0000 11/22/18 0000 01/28/19 0000 03/27/19 0000  WBC 8.6   < > 7.4   < > 7.6   < > 8.3 6.1 6.4  NEUTROABS  --   --   --   --   --    < > 6 4 3   HGB 11.3*   < > 11.9*   < > 12.8   < > 12.3 12.0 13.1  HCT 34.9*   < > 36.1   < > 38.8   < > 37 37 39  MCV 93.1  --  90.7  --  90.2  --   --   --   --   PLT 167   < > 205   < > 200   < > 234 227 218   < > = values in this interval not displayed.    Lab Results  Component Value Date   TSH 0.59 07/31/2017   Lab Results  Component Value Date   HGBA1C 5.4 06/11/2016   Lab Results  Component Value Date   CHOL 134 07/31/2017   HDL 54 06/11/2016   LDLCALC 61 07/31/2017   TRIG 102 07/31/2017    Significant Diagnostic Results in last 30 days:  No results found.  Assessment/Plan  #1 history of dementia with bipolar disorder-this appears relatively stabilized I believe she still has medication refusal at times but nursing does not report big changes here.  She is on Seroquel 125 mg at night-she continues on Celexa for skin picking behaviors.  She does have some history of weight loss she is on supplements-there is suspicion that there may be a metastatic process contributing to the weight loss as well as noted above.  At this point continue comfort measures.  2.  History of hypercalcemia as noted above there are concerns for a metastatic process-Dr. [name] has discussed this with her daughter and appears comfort measures are preferred.  3.  History of anemia hemoglobin was 13.1 on lab done in February this appears stable she is on iron.  4.  History of hypotension as noted per discussion above this appears stable she does receive Florinef in the morning.  5.  History of COPD she does have as needed albuterol I do not believe this is needed much-this has been stable.  6.  History of hypokalemia she is on low-dose potassium this is been stable as well.  7.  History of allergic rhinitis she continues on Claritin and apparently this has not really been an issue recently either.  [id number]

## 2019-05-31 ENCOUNTER — Encounter: Payer: Self-pay | Admitting: Internal Medicine

## 2019-06-30 ENCOUNTER — Non-Acute Institutional Stay: Payer: Self-pay | Admitting: Internal Medicine

## 2019-06-30 ENCOUNTER — Encounter: Payer: Self-pay | Admitting: Internal Medicine

## 2019-06-30 NOTE — Progress Notes (Deleted)
Patient ID: Taylor Roth, female   DThis encounter was created in error - please disregard.OB: August 20, 1928, 86 y.o.   MRN: [id number]  Location:  [facility] Room Number: [place]:  SNF [id number]) Provider:   Gayland Curry, DO  Patient Care Team: Gayland Curry, DO as PCP - General (Geriatric Medicine) [name], MD (Gastroenterology) [name], NP (Obstetrics and Gynecology) Rehab, [place] ([name])  Extended Emergency Contact Information Primary Emergency Contact: [name],[name] Address: [address], [place] Phone: [phone] Mobile Phone: [phone] Relation: Daughter Secondary Emergency Contact: [place] Phone: [phone] Mobile Phone: [phone] Relation: Daughter  Code Status:  DNR Goals of care: Advanced Directive information Advanced Directives 06/30/2019  Does Patient Have a Medical Advance Directive? Yes  Type of Paramedic of [place];Out of facility DNR (pink MOST or yellow form)  Does patient want to make changes to medical advance directive? No - Patient declined  Copy of [place] in Chart? Yes - validated most recent copy scanned in chart (See row information)  Would patient like information on creating a medical advance directive? -  Pre-existing out of facility DNR order (yellow form or pink MOST form) -     Chief Complaint  Patient presents with  . Acute Visit    Weight loss / goals of care     HPI:  Pt is a 86 y.o. female seen today for an acute visit for weight loss, readdress goals of care and hypercalcemia.  Does have DNR, but needs MOST form in place.  Social work has talked with her daughter prior to today.   Past Medical History:  Diagnosis Date  . Anxiety   . Arterial tortuosity (aorta) 12/11/2012  . Asthma   .  Chronic kidney disease, stage IV (severe) ([place]) 12/11/2012  . Chronic mental illness   . CKD (chronic kidney disease), stage III 01/14/2015  . Closed hip fracture requiring operative repair, right, with routine healing, subsequent encounter 12/19/2015  . Colitis   . COPD (chronic obstructive pulmonary disease) ([place])   . Decreased rectal sphincter tone 11/21/2012  . Depression   . Drug-seeking behavior 02/26/2011   Use of multiple md to obtain benzos/narcotics pt must sign contract to receive controlled meds.   Marland Kitchen Dysphagia 01/11/2015  . GERD (gastroesophageal reflux disease)   . Glaucoma 06/26/2011  . Glaucoma of both eyes   . Hearing loss, sensorineural 03/04/2014   [facility]; 02/26/14. There is not a medical or surgical treatment that would benefit her type of hearing loss. We discussed amplification in an overview fashion.    Marland Kitchen History of fall   . Insomnia, unspecified   . Macular degeneration 06/26/2011  . Migraines   . Nasal bone fracture 03/30/2014  . Osteoporosis   . Osteoporosis 02/26/2011  . Presence of right artificial hip joint   . Protein-calorie malnutrition, severe ([place]) 12/10/2012  . S/P ORIF (open reduction internal fixation) fracture 01/08/2016  . Schatzki's ring 01/14/2015  . Scoliosis   . Substance abuse ([place])    Benbzos and Narcotics, she does not get any of those medicines from [facility], we have empathetically told her that.  . Vertigo   . Vitamin B deficiency   . Vitamin D deficiency 08/25/2016  . Weight loss    Past Surgical History:  Procedure Laterality Date  . ABDOMINAL HYSTERECTOMY    . CATARACT EXTRACTION    . CHOLECYSTECTOMY    . ESOPHAGOGASTRODUODENOSCOPY (EGD) WITH PROPOFOL N/A 01/13/2015   Procedure: ESOPHAGOGASTRODUODENOSCOPY (EGD) WITH PROPOFOL;  Surgeon: [name], MD;  Location: [facility];  Service: Endoscopy;  Laterality: N/A;  . HIP ARTHROPLASTY Right 12/21/2015   Procedure: ARTHROPLASTY BIPOLAR HIP (HEMIARTHROPLASTY);  Surgeon: [name], MD;  Location: [facility];  Service: Orthopedics;  Laterality: Right;  . PARTIAL HYSTERECTOMY      Allergies  Allergen Reactions  . Amoxicillin     Per daughter  . Ampicillin     Per daughter  . Depakote [Valproic Acid]     DOES NOT WANT HER TO TAKE IT==Makes her hair fall out= per daughter who is poa  . Macrobid [Nitrofurantoin Macrocrystal] Rash    Per MAR  . Penicillins     Per daughter  . Acetaminophen Other (See Comments)    jaundice  . Azithromycin Other (See Comments)    Per MAR  . Butalbital   . Caffeine   . Doxycycline Other (See Comments)    Per MAR  . Escitalopram Oxalate Other (See Comments)    Per MAR  . Fioricet [Butalbital-Apap-Caffeine] Other (See Comments)    Drunk. Per MAR  . Latex Other (See Comments)    Per MAR  . Levofloxacin Other (See Comments)    Per MAR  . Levofloxacin   . Nsaids Other (See Comments)    This is not an allergy. The patient was told by Dr. Rockne Menghini to avoid NSAIDs and stop Vicoprofen in to 2014 to avoid nephrotoxicity.  Per MAR.  Marland Kitchen Oxycodone Other (See Comments)    reaction to synthetic codeine Per MAR  . Restasis [Cyclosporine] Other (See Comments)    Per MAR  . Sulfa Antibiotics Other (See Comments)    Per MAR  . Biaxin [Clarithromycin] Rash    With burning sensation Per [facility]    Outpatient Encounter Medications as of 06/30/2019  Medication Sig  . acetaminophen (TYLENOL) 325 MG tablet Take 650 mg by mouth every 6 (six) hours as needed.  Marland Kitchen acetaminophen (TYLENOL) 500 MG tablet Give 2 tablets ( 1,000 mg total ) by mouth nightly for pain  . albuterol (PROVENTIL HFA;VENTOLIN HFA) 108 (90 BASE) MCG/ACT inhaler Inhale 1 puff into the lungs every 6 (six) hours as needed for shortness of breath.   Marland Kitchen aspirin 81 MG chewable tablet Chew 81 mg by mouth daily.  . Biotin 5000 MCG CAPS Take 1 capsule by mouth daily.   . bisacodyl (DULCOLAX) 10 MG suppository If not relieved by MOM, give 10 mg Bisacodyl suppositiory rectally X 1  dose in 24 hours as needed (Do not use constipation standing orders for residents with renal failure/CFR less than 30. Contact MD for orders) (Physician Order)  . Cholecalciferol (VITAMIN D3) 1.25 MG (50000 UT) CAPS Take 1 capsule by mouth once a week.  . citalopram (CELEXA) 10 MG tablet Take 10 mg by mouth daily.  . Cranberry 450 MG TABS Take 1 tablet by mouth 2 (two) times daily.   Marland Kitchen docusate sodium (COLACE) 100 MG capsule Take 100 mg by mouth 2 (two) times daily.  . dorzolamide-timolol (COSOPT) 22.3-6.8 MG/ML ophthalmic solution Place 1 drop into both eyes 2 (two) times daily.  Marland Kitchen ENSURE (ENSURE) Take 237 mLs by mouth 4 (four) times daily. D/t weight loss  . ferrous sulfate 325 (65 FE) MG tablet Take 325 mg by mouth daily.   Marland Kitchen  fludrocortisone (FLORINEF) 0.1 MG tablet Take 0.1 mg by mouth every morning. for orthostatic hypotension  . lactulose (CHRONULAC) 10 GM/15ML solution Take 15 mLs (10 g total) by mouth daily as needed for severe constipation.  Marland Kitchen loratadine (CLARITIN) 10 MG tablet Take 10 mg by mouth daily.   . magnesium hydroxide (MILK OF MAGNESIA) 400 MG/5ML suspension If no BM in 3 days, give 30 cc Milk of Magnesium p.o. x 1 dose in 24 hours as needed (Do not use standing constipation orders for residents with renal failure CFR less than 30. Contact MD for orders) (Physician Order)  . mirtazapine (REMERON) 15 MG tablet Take 7.5 mg by mouth at bedtime.  . nitroGLYCERIN (NITROSTAT) 0.4 MG SL tablet Place 0.4 mg under the tongue every 5 (five) minutes as needed for chest pain. May use 3 times  . NON FORMULARY Diet: Pureed Diet, Honey Thickened Liquids - to be given by spoon.  Vladimir Faster Glycol-Propyl Glycol (SYSTANE) 0.4-0.3 % SOLN Place 2 drops into both eyes every 2 (two) hours as needed (dry eyes).   . polyethylene glycol (MIRALAX / GLYCOLAX) packet Take 17 g by mouth daily.   . potassium chloride (K-DUR) 20 MEQ tablet Take 1 tablet (20 mEq total) by mouth daily.  . QUEtiapine (SEROQUEL)  100 MG tablet Take 100 mg by mouth at bedtime. Take along with a 25 mg tablet to = 125 mg qhs  . QUEtiapine (SEROQUEL) 25 MG tablet Take 25 mg by mouth at bedtime. Take along with a 100 mg tablet to = 125 mg qhs  . senna (SENOKOT) 8.6 MG tablet Take 2 tablets by mouth 2 (two) times daily as needed for constipation.  . Sodium Phosphates (RA SALINE ENEMA RE) If not relieved by Biscodyl suppository, give disposable Saline Enema rectally X 1 dose/24 hrs as needed (Do not use constipation standing orders for residents with renal failure/CFR less than 30. Contact MD for orders)(Physician Or  . travoprost, benzalkonium, (TRAVATAN) 0.004 % ophthalmic solution Place 1 drop into both eyes at bedtime.  . vitamin B-12 (CYANOCOBALAMIN) 1000 MCG tablet Take 1,500 mcg by mouth daily. 1-1/2 tablets to = 1500 mcg  . [DISCONTINUED] acetaminophen (TYLENOL) 325 MG tablet Take 650 mg by mouth every 6 (six) hours as needed for headache.   No facility-administered encounter medications on file as of 06/30/2019.    Review of Systems  Immunization History  Administered Date(s) Administered  . Influenza Split 11/13/2011  . Influenza-Unspecified 02/13/2008, 11/30/2013, 12/20/2016  . Moderna SARS-COVID-2 Vaccination 02/12/2019, 03/12/2019  . PPD Test 12/15/2015  . Pneumococcal Conjugate-13 10/16/2016  . Pneumococcal-Unspecified 02/13/2008  . Td 02/12/2009   Pertinent  Health Maintenance Due  Topic Date Due  . INFLUENZA VACCINE  09/13/2019  . DEXA SCAN  Completed  . PNA vac Low Risk Adult  Completed   Fall Risk  08/27/2017 08/22/2016  Falls in the past year? No Yes  Number falls in past yr: - 2 or more  Injury with Fall? - Yes  Comment - back of head   Functional Status Survey:    Vitals:   06/30/19 1551  BP: 123/79  Pulse: 66  Temp: 97.8 F (36.6 C)  Weight: 66 lb 12.8 oz (30.3 kg)  Height: 4\' 11"  (1.499 m)   Body mass index is 13.49 kg/m. Physical Exam  Labs reviewed: Recent Labs    12/06/18  0000 01/28/19 0000 03/27/19 0000  NA 144 141 141  K 4.9 3.9 5.0  CL 104 103 105  CO2  26* 28* 26*  BUN 23* 22* 28*  CREATININE 0.8 0.7 0.7  CALCIUM 9.6 9.5 10.1   Recent Labs    09/02/18 0000 03/27/19 0000  AST 17 17  ALT 9 10  ALKPHOS 98 93  ALBUMIN  --  3.7   Recent Labs    11/22/18 0000 01/28/19 0000 03/27/19 0000  WBC 8.3 6.1 6.4  NEUTROABS 6 4 3   HGB 12.3 12.0 13.1  HCT 37 37 39  PLT 234 227 218   Lab Results  Component Value Date   TSH 0.59 07/31/2017   Lab Results  Component Value Date   HGBA1C 5.4 06/11/2016   Lab Results  Component Value Date   CHOL 134 07/31/2017   HDL 54 06/11/2016   LDLCALC 61 07/31/2017   TRIG 102 07/31/2017    Significant Diagnostic Results in last 30 days:  No results found.  Assessment/Plan There are no diagnoses linked to this encounter.  F/u BMP When result returns, I will reach out again to her daughter to discuss appropriate comfort care for this frail, sweet lady with dementia and cachexia  Family/ staff Communication: ***  Labs/tests ordered:  ***  Tiffany L. Reed, D.O. [facility] [address], [place] [address] Cell Phone (Mon-Fri 8am-5pm):  [phone] On Call:  [phone] & follow prompts after 5pm & weekends Office Phone:  [phone] Office Fax:  [phone]

## 2019-07-01 LAB — BASIC METABOLIC PANEL
BUN: 25 — AB (ref 4–21)
CO2: 28 — AB (ref 13–22)
Chloride: 101 (ref 99–108)
Creatinine: 0.9 (ref 0.5–1.1)
Glucose: 95
Potassium: 3.3 — AB (ref 3.4–5.3)
Sodium: 142 (ref 137–147)

## 2019-07-01 LAB — COMPREHENSIVE METABOLIC PANEL
Calcium: 11.1 — AB (ref 8.7–10.7)
GFR calc Af Amer: 67.92
GFR calc non Af Amer: 58.6

## 2019-07-02 ENCOUNTER — Non-Acute Institutional Stay (SKILLED_NURSING_FACILITY): Payer: Medicare Other | Admitting: Internal Medicine

## 2019-07-02 ENCOUNTER — Encounter: Payer: Self-pay | Admitting: Internal Medicine

## 2019-07-02 DIAGNOSIS — F22 Delusional disorders: Secondary | ICD-10-CM

## 2019-07-02 DIAGNOSIS — D638 Anemia in other chronic diseases classified elsewhere: Secondary | ICD-10-CM | POA: Diagnosis not present

## 2019-07-02 DIAGNOSIS — F03C0 Unspecified dementia, severe, without behavioral disturbance, psychotic disturbance, mood disturbance, and anxiety: Secondary | ICD-10-CM

## 2019-07-02 DIAGNOSIS — F0390 Unspecified dementia without behavioral disturbance: Secondary | ICD-10-CM | POA: Diagnosis not present

## 2019-07-02 DIAGNOSIS — N183 Chronic kidney disease, stage 3 unspecified: Secondary | ICD-10-CM

## 2019-07-02 DIAGNOSIS — R627 Adult failure to thrive: Secondary | ICD-10-CM

## 2019-07-02 DIAGNOSIS — E43 Unspecified severe protein-calorie malnutrition: Secondary | ICD-10-CM

## 2019-07-02 NOTE — Progress Notes (Signed)
opened in error

## 2019-07-02 NOTE — Progress Notes (Signed)
Patient ID: Taylor Roth, female   DOB: 03/19/1928, 86 y.o.   MRN: [id number]  Location:  [facility] Room Number: [place]:  SNF [phone]) Provider:  Hezzie Karim L. Mariea Clonts, D.O., C.M.D.  Gayland Curry, DO  Patient Care Team: Gayland Curry, DO as PCP - General (Geriatric Medicine) [name], MD (Gastroenterology) [name], NP (Obstetrics and Gynecology) [facility], [facility] ([place])  Extended Emergency Contact Information Primary Emergency Contact: [name],[name] Address: [place], [place] Phone: [phone] Mobile Phone: [phone] Relation: Daughter Secondary Emergency Contact: [place] Phone: [phone] Mobile Phone: [phone] Relation: Daughter  Code Status:  DNR Goals of care: Advanced Directive information Advanced Directives 07/02/2019  Does Patient Have a Medical Advance Directive? Yes  Type of Advance Directive Out of facility DNR (pink MOST or yellow form)  Does patient want to make changes to medical advance directive? No - Patient declined  Copy of [place] in Chart? -  Would patient like information on creating a medical advance directive? -  Pre-existing out of facility DNR order (yellow form or pink MOST form) -     Chief Complaint  Patient presents with  . Acute Visit    weight loss, hypercalcemia, goals of care    HPI:  Pt is a 86 y.o. female with severe dementia with psychosis and delusions, constipation, anemia, failure to thrive, legal blindness, COPD seen 07/02/19  for an acute visit for weight loss, hypercalcemia, and goals of care/MOST form.    Resident has advanced dementia with minimal speech, ADL dependence, frailty and h/o falls.  She has behaviors with psychosis and paranoid delusions.  She lost 10 lbs from feb to march and was noted to have  hypercalcemia to 14 at that time along with acute renal failure; however, her lab has not been rechecked since as resident was refusing to be stuck by phlebotomist.  Dr. [name] had spoken with her daughter previously and did not recommend a cancer workup due to resident's advanced age and dementia.  MOST could not be done at that time.  Staff member spoke with resident's daughter again recently about goals of care and the following conclusions were made then:  DNR remains, yes to hospitalization, yes to IVFs, yes to abx, no to surgery, no to tube feeding, yes to labs.  F/u calcium was requested.    She does have a h/o schatzki's ring and esophageal dysmotility, as well.  She had endoscopy in 2016 which showed a pocket that was contributing to her dysphagia and he felt things were sticking in that.  Nothing was able to be dilated then.  She is on pureed diet with honey thickened liquids that can be given by spoon.  Requires hand on hand assistance with meals.  She is on remeron for depression and appetite stimulation, as well as qid ensure.    Weight has actually been stable at just 60 lbs for the past 2 months now.  Resident is quite cachectic.    She has a protective dressing over her sacrum to help prevent pressure injury.  Chart also lists bipolar disease and prior substance abuse of benzos and narcotics.    Past Medical History:  Diagnosis Date  . Anxiety   . Arterial tortuosity (aorta) 12/11/2012  . Asthma   . Chronic kidney  disease, stage IV (severe) ([place]) 12/11/2012  . Chronic mental illness   . CKD (chronic kidney disease), stage III 01/14/2015  . Closed hip fracture requiring operative repair, right, with routine healing, subsequent encounter 12/19/2015  . Colitis   . COPD (chronic obstructive pulmonary disease) ([place])   . Decreased rectal sphincter tone 11/21/2012  . Depression   . Drug-seeking behavior 02/26/2011   Use of multiple md to obtain benzos/narcotics pt must sign  contract to receive controlled meds.   Marland Kitchen Dysphagia 01/11/2015  . GERD (gastroesophageal reflux disease)   . Glaucoma 06/26/2011  . Glaucoma of both eyes   . Hearing loss, sensorineural 03/04/2014   [facility]; 02/26/14. There is not a medical or surgical treatment that would benefit her type of hearing loss. We discussed amplification in an overview fashion.    Marland Kitchen History of fall   . Insomnia, unspecified   . Macular degeneration 06/26/2011  . Migraines   . Nasal bone fracture 03/30/2014  . Osteoporosis   . Osteoporosis 02/26/2011  . Presence of right artificial hip joint   . Protein-calorie malnutrition, severe ([place]) 12/10/2012  . S/P ORIF (open reduction internal fixation) fracture 01/08/2016  . Schatzki's ring 01/14/2015  . Scoliosis   . Substance abuse ([place])    Benbzos and Narcotics, she does not get any of those medicines from [facility], we have empathetically told her that.  . Vertigo   . Vitamin B deficiency   . Vitamin D deficiency 08/25/2016  . Weight loss    Past Surgical History:  Procedure Laterality Date  . ABDOMINAL HYSTERECTOMY    . CATARACT EXTRACTION    . CHOLECYSTECTOMY    . ESOPHAGOGASTRODUODENOSCOPY (EGD) WITH PROPOFOL N/A 01/13/2015   Procedure: ESOPHAGOGASTRODUODENOSCOPY (EGD) WITH PROPOFOL;  Surgeon: [name], MD;  Location: [facility];  Service: Endoscopy;  Laterality: N/A;  . HIP ARTHROPLASTY Right 12/21/2015   Procedure: ARTHROPLASTY BIPOLAR HIP (HEMIARTHROPLASTY);  Surgeon: [name], MD;  Location: [facility];  Service: Orthopedics;  Laterality: Right;  . PARTIAL HYSTERECTOMY      Allergies  Allergen Reactions  . Amoxicillin     Per daughter  . Ampicillin     Per daughter  . Depakote [Valproic Acid]     DOES NOT WANT HER TO TAKE IT==Makes her hair fall out= per daughter who is poa  . Macrobid [Nitrofurantoin Macrocrystal] Rash    Per MAR  . Penicillins     Per daughter  . Acetaminophen Other (See Comments)    jaundice  . Azithromycin  Other (See Comments)    Per MAR  . Butalbital   . Caffeine   . Doxycycline Other (See Comments)    Per MAR  . Escitalopram Oxalate Other (See Comments)    Per MAR  . Fioricet [Butalbital-Apap-Caffeine] Other (See Comments)    Drunk. Per MAR  . Latex Other (See Comments)    Per MAR  . Levofloxacin Other (See Comments)    Per MAR  . Levofloxacin   . Nsaids Other (See Comments)    This is not an allergy. The patient was told by Dr. Rockne Menghini to avoid NSAIDs and stop Vicoprofen in to 2014 to avoid nephrotoxicity.  Per MAR.  Marland Kitchen Oxycodone Other (See Comments)    reaction to synthetic codeine Per MAR  . Restasis [Cyclosporine] Other (See Comments)    Per MAR  . Sulfa Antibiotics Other (See Comments)    Per MAR  . Biaxin [Clarithromycin] Rash    With burning sensation Per  MAR    Outpatient Encounter Medications as of 07/02/2019  Medication Sig  . acetaminophen (TYLENOL) 325 MG tablet Take 650 mg by mouth every 6 (six) hours as needed.  Marland Kitchen acetaminophen (TYLENOL) 500 MG tablet Give 2 tablets ( 1,000 mg total ) by mouth nightly for pain  . albuterol (PROVENTIL HFA;VENTOLIN HFA) 108 (90 BASE) MCG/ACT inhaler Inhale 1 puff into the lungs every 6 (six) hours as needed for shortness of breath.   Marland Kitchen aspirin 81 MG chewable tablet Chew 81 mg by mouth daily.  . Biotin 5000 MCG CAPS Take 1 capsule by mouth daily.   . bisacodyl (DULCOLAX) 10 MG suppository If not relieved by MOM, give 10 mg Bisacodyl suppositiory rectally X 1 dose in 24 hours as needed (Do not use constipation standing orders for residents with renal failure/CFR less than 30. Contact MD for orders) (Physician Order)  . Cholecalciferol (VITAMIN D3) 1.25 MG (50000 UT) CAPS Take 1 capsule by mouth once a week.  . citalopram (CELEXA) 10 MG tablet Take 10 mg by mouth daily.  . Cranberry 450 MG TABS Take 1 tablet by mouth 2 (two) times daily.   Marland Kitchen docusate sodium (COLACE) 100 MG capsule Take 100 mg by mouth 2 (two) times daily.  .  dorzolamide-timolol (COSOPT) 22.3-6.8 MG/ML ophthalmic solution Place 1 drop into both eyes 2 (two) times daily.  Marland Kitchen ENSURE (ENSURE) Take 237 mLs by mouth 4 (four) times daily. D/t weight loss  . ferrous sulfate 325 (65 FE) MG tablet Take 325 mg by mouth daily.   . fludrocortisone (FLORINEF) 0.1 MG tablet Take 0.1 mg by mouth every morning. for orthostatic hypotension  . lactulose (CHRONULAC) 10 GM/15ML solution Take 15 mLs (10 g total) by mouth daily as needed for severe constipation.  Marland Kitchen loratadine (CLARITIN) 10 MG tablet Take 10 mg by mouth daily.   . magnesium hydroxide (MILK OF MAGNESIA) 400 MG/5ML suspension If no BM in 3 days, give 30 cc Milk of Magnesium p.o. x 1 dose in 24 hours as needed (Do not use standing constipation orders for residents with renal failure CFR less than 30. Contact MD for orders) (Physician Order)  . mirtazapine (REMERON) 15 MG tablet Take 7.5 mg by mouth at bedtime.  . nitroGLYCERIN (NITROSTAT) 0.4 MG SL tablet Place 0.4 mg under the tongue every 5 (five) minutes as needed for chest pain. May use 3 times  . NON FORMULARY Diet: Pureed Diet, Honey Thickened Liquids - to be given by spoon.  Vladimir Faster Glycol-Propyl Glycol (SYSTANE) 0.4-0.3 % SOLN Place 2 drops into both eyes every 2 (two) hours as needed (dry eyes).   . polyethylene glycol (MIRALAX / GLYCOLAX) packet Take 17 g by mouth daily.   . potassium chloride (K-DUR) 20 MEQ tablet Take 1 tablet (20 mEq total) by mouth daily.  . QUEtiapine (SEROQUEL) 100 MG tablet Take 100 mg by mouth at bedtime. Take along with a 25 mg tablet to = 125 mg qhs  . QUEtiapine (SEROQUEL) 25 MG tablet Take 25 mg by mouth at bedtime. Take along with a 100 mg tablet to = 125 mg qhs  . senna (SENOKOT) 8.6 MG tablet Take 2 tablets by mouth 2 (two) times daily as needed for constipation.  . Sodium Phosphates (RA SALINE ENEMA RE) If not relieved by Biscodyl suppository, give disposable Saline Enema rectally X 1 dose/24 hrs as needed (Do not use  constipation standing orders for residents with renal failure/CFR less than 30. Contact MD for orders)(Physician Or  .  travoprost, benzalkonium, (TRAVATAN) 0.004 % ophthalmic solution Place 1 drop into both eyes at bedtime.  . vitamin B-12 (CYANOCOBALAMIN) 1000 MCG tablet Take 1,500 mcg by mouth daily. 1-1/2 tablets to = 1500 mcg   No facility-administered encounter medications on file as of 07/02/2019.    Review of Systems  Constitutional: Positive for malaise/fatigue and weight loss. Negative for chills and fever.  HENT: Negative for congestion and sore throat.   Eyes: Positive for blurred vision.       Legally blind actually  Respiratory: Negative for cough and shortness of breath.   Cardiovascular: Negative for chest pain and leg swelling.  Gastrointestinal: Negative for abdominal pain and constipation.  Genitourinary: Negative for dysuria.       Incontinence  Musculoskeletal: Positive for falls. Negative for joint pain.    Immunization History  Administered Date(s) Administered  . Influenza Split 11/13/2011  . Influenza-Unspecified 02/13/2008, 11/30/2013, 12/20/2016  . Moderna SARS-COVID-2 Vaccination 02/12/2019, 03/12/2019  . PPD Test 12/15/2015  . Pneumococcal Conjugate-13 10/16/2016  . Pneumococcal-Unspecified 02/13/2008  . Td 02/12/2009   Pertinent  Health Maintenance Due  Topic Date Due  . INFLUENZA VACCINE  09/13/2019  . DEXA SCAN  Completed  . PNA vac Low Risk Adult  Completed   Fall Risk  08/27/2017 08/22/2016  Falls in the past year? No Yes  Number falls in past yr: - 2 or more  Injury with Fall? - Yes  Comment - back of head   Functional Status Survey:    Vitals:   07/02/19 1457  BP: 123/79  Pulse: 66  Temp: 97.8 F (36.6 C)  Weight: 66 lb 12.8 oz (30.3 kg)  Height: 4\' 11"  (1.499 m)   Body mass index is 13.49 kg/m. Physical Exam Vitals reviewed.  Constitutional:      General: She is not in acute distress.    Appearance: She is not  toxic-appearing.     Comments: Cachectic female sitting up in wheelchair  HENT:     Head: Normocephalic and atraumatic.     Right Ear: External ear normal.     Left Ear: External ear normal.     Nose: Nose normal.     Mouth/Throat:     Pharynx: Oropharynx is clear.  Eyes:     Conjunctiva/sclera: Conjunctivae normal.     Comments: Legally blind  Cardiovascular:     Rate and Rhythm: Normal rate and regular rhythm.     Pulses: Normal pulses.     Heart sounds: Normal heart sounds.  Pulmonary:     Effort: Pulmonary effort is normal.     Breath sounds: Normal breath sounds. No wheezing, rhonchi or rales.  Abdominal:     General: Abdomen is flat. Bowel sounds are normal.     Palpations: Abdomen is soft.     Tenderness: There is no abdominal tenderness.  Musculoskeletal:        General: Normal range of motion.     Cervical back: Neck supple.     Right lower leg: No edema.     Left lower leg: No edema.  Lymphadenopathy:     Cervical: No cervical adenopathy.  Skin:    General: Skin is warm and dry.     Capillary Refill: Capillary refill takes less than 2 seconds.     Comments: Erythema of sacral area so dressing in place for protection  Neurological:     Mental Status: She is alert. Mental status is at baseline.     Comments: In manual wheelchair  Psychiatric:     Comments: Did not speak but a couple of words so difficult to assess     Labs reviewed: Recent Labs    12/06/18 0000 01/28/19 0000 03/27/19 0000  NA 144 141 141  K 4.9 3.9 5.0  CL 104 103 105  CO2 26* 28* 26*  BUN 23* 22* 28*  CREATININE 0.8 0.7 0.7  CALCIUM 9.6 9.5 10.1   Recent Labs    09/02/18 0000 03/27/19 0000  AST 17 17  ALT 9 10  ALKPHOS 98 93  ALBUMIN  --  3.7   Recent Labs    11/22/18 0000 01/28/19 0000 03/27/19 0000  WBC 8.3 6.1 6.4  NEUTROABS 6 4 3   HGB 12.3 12.0 13.1  HCT 37 37 39  PLT 234 227 218   Lab Results  Component Value Date   TSH 0.59 07/31/2017   Lab Results    Component Value Date   HGBA1C 5.4 06/11/2016   Lab Results  Component Value Date   CHOL 134 07/31/2017   HDL 54 06/11/2016   LDLCALC 61 07/31/2017   TRIG 102 07/31/2017    Assessment/Plan 1. Advanced dementia ([place]) -I need to have a meeting with her daughter and discuss where she is overall with her disease -at this time, she does have a DNR order, but resident does not want her blood taken to recheck calcium and could not tolerate further workup or treatment if we did find persistent hypercalcemia -suspect her cachexia is purely from her advanced dementia and no longer remembering how to eat and losing desire to eat  2. Adult failure to thrive -as in #1--appears that though she is offered supplements, has hand over hand feeding of modified diet, intake remains poor; she's also on remeron without weight gain (wt is stable at 60)  3. Anemia of chronic disease -from CKD, last hgb normal--may not need longstanding iron supplement that contributes to constipation  4. Paranoia ([place]) -on chronic seroquel for her psychosis and paranoid delusions -there is a note about bipolar disorder in her record--not certain if she's had this in younger years? -will ask nursing to note more about paranoid behaviors (I may not be looking in the correct location)  5. Protein-calorie malnutrition, severe ([facility]) -cont mirtazapine, supplement shakes, hand over hand feeding and I need to discuss goals of care with her daughter--ideally in person  6. Stage 3 chronic kidney disease, unspecified whether stage 3a or 3b CKD -Avoid nephrotoxic agents like nsaids, dose adjust renally excreted meds, hydrate. -intake poor so this is not likely to improve  Family/ staff Communication: discussed with PA and ADON  Labs/tests ordered:  BMP   Andra Heslin L. Johnathon Olden, D.O. [facility] [address], [place] [address] Cell Phone (Mon-Fri 8am-5pm):  [phone] On Call:   [phone] & follow prompts after 5pm & weekends Office Phone:  [phone] Office Fax:  [phone]

## 2019-07-02 NOTE — Progress Notes (Deleted)
A user error has taken place: {error:[id number]}.

## 2019-07-14 ENCOUNTER — Non-Acute Institutional Stay (SKILLED_NURSING_FACILITY): Payer: Medicare Other | Admitting: Internal Medicine

## 2019-07-14 ENCOUNTER — Encounter: Payer: Self-pay | Admitting: Internal Medicine

## 2019-07-14 DIAGNOSIS — F0390 Unspecified dementia without behavioral disturbance: Secondary | ICD-10-CM | POA: Diagnosis not present

## 2019-07-14 DIAGNOSIS — D638 Anemia in other chronic diseases classified elsewhere: Secondary | ICD-10-CM | POA: Diagnosis not present

## 2019-07-14 DIAGNOSIS — F03C0 Unspecified dementia, severe, without behavioral disturbance, psychotic disturbance, mood disturbance, and anxiety: Secondary | ICD-10-CM

## 2019-07-14 DIAGNOSIS — N183 Chronic kidney disease, stage 3 unspecified: Secondary | ICD-10-CM | POA: Diagnosis not present

## 2019-07-14 DIAGNOSIS — E876 Hypokalemia: Secondary | ICD-10-CM

## 2019-07-14 NOTE — Progress Notes (Signed)
Location:    [facility] Room Number: 304/D Place of Service:  SNF [phone]) Provider:  Rosalyn Gess, DO  Patient Care Team: Gayland Curry, DO as PCP - General (Geriatric Medicine) [name], MD (Gastroenterology) [name], NP (Obstetrics and Gynecology) Re[facility], [facility] ([place])  Extended Emergency Contact Information Primary Emergency Contact: [name],[name] Address: [place], [place] Phone: [phone] Mobile Phone: [phone] Relation: Daughter Secondary Emergency Contact: [place] Phone: [phone] Mobile Phone: [phone] Relation: Daughter  Code Status:  DNR Goals of care: Advanced Directive information Advanced Directives 07/14/2019  Does Patient Have a Medical Advance Directive? Yes  Type of Advance Directive Out of facility DNR (pink MOST or yellow form)  Does patient want to make changes to medical advance directive? No - Patient declined  Copy of [place] in Chart? -  Would patient like information on creating a medical advance directive? -  Pre-existing out of facility DNR order (yellow form or pink MOST form) Yellow form placed in chart (order not valid for inpatient use)   Chief complaint-routine visit for medical management of chronic medical conditions including history of dementia-paranoia-bipolar disorder-hypertension-anemia-COPD-as well as elevated calcium    HPI:  Pt is a 86 y.o. female seen today for medical management of chronic diseases.  As noted above At this point she appears to be at relative baseline.  She was recently seen by Dr. Mariea Clonts for weight loss hypercalcemia and goals of care-and Dr. Mariea Clonts is going to try to speak with her daughter to reassess goals of care-she has a history of behaviors with psychosis and paranoid delusions-but  this appears to be fairly stabilized on the Seroquel 125 mg nightly.  She has had weight loss but this appears to have stabilized with weights in the mid 60s recently she is on Remeron for appetite stimulation.  She does have a history of Sasaki's ring and esophageal dysmotility-.  She is on a pured diet with honey thick liquids that be given by spoon-she requires hand on hand assistance with meals.  At one point she complained of left thigh pain this appears to be under better control she does have a Lidoderm patch as well as Tylenol which she receives at night.  X-rays did not show any acute process.  She also had one point had a history of significant elevated calcium of over 13-however recent labs shows this is moderate at 11.1-per most recent discussion with daughter staff says that her daughter is okay with getting further labs so will update a calcium level when patient will allow-at times patient is adverse to phlebotomy.  She also has a history of sacral ulcer this is followed by wound care and is currently covered.  Lab did show her potassium was slightly low at 3.3 and we did supplement this short-term with 20 mEq of potassium but have discontinued this and would like to get updated labs to see if she needs continued potassium.  Currently she is sitting in her wheelchair comfortably does not really have any complaints other that she would like to get back to bed.  Vital signs appear to be stable     Past Medical History:  Diagnosis Date  . Anxiety   . Arterial tortuosity (aorta) 12/11/2012  . Asthma   . Chronic kidney  disease, stage IV (severe) ([place]) 12/11/2012  . Chronic mental illness   . CKD (chronic kidney disease), stage III 01/14/2015  . Closed hip fracture requiring operative repair, right, with routine healing, subsequent encounter 12/19/2015  . Colitis   . COPD (chronic obstructive pulmonary disease) ([place])   . Decreased rectal sphincter tone 11/21/2012  .  Depression   . Drug-seeking behavior 02/26/2011   Use of multiple md to obtain benzos/narcotics pt must sign contract to receive controlled meds.   Marland Kitchen Dysphagia 01/11/2015  . GERD (gastroesophageal reflux disease)   . Glaucoma 06/26/2011  . Glaucoma of both eyes   . Hearing loss, sensorineural 03/04/2014   [facility]; 02/26/14. There is not a medical or surgical treatment that would benefit her type of hearing loss. We discussed amplification in an overview fashion.    Marland Kitchen History of fall   . Insomnia, unspecified   . Macular degeneration 06/26/2011  . Migraines   . Nasal bone fracture 03/30/2014  . Osteoporosis   . Osteoporosis 02/26/2011  . Presence of right artificial hip joint   . Protein-calorie malnutrition, severe ([place]) 12/10/2012  . S/P ORIF (open reduction internal fixation) fracture 01/08/2016  . Schatzki's ring 01/14/2015  . Scoliosis   . Substance abuse ([place])    Benbzos and Narcotics, she does not get any of those medicines from [facility], we have empathetically told her that.  . Vertigo   . Vitamin B deficiency   . Vitamin D deficiency 08/25/2016  . Weight loss    Past Surgical History:  Procedure Laterality Date  . ABDOMINAL HYSTERECTOMY    . CATARACT EXTRACTION    . CHOLECYSTECTOMY    . ESOPHAGOGASTRODUODENOSCOPY (EGD) WITH PROPOFOL N/A 01/13/2015   Procedure: ESOPHAGOGASTRODUODENOSCOPY (EGD) WITH PROPOFOL;  Surgeon: [name], MD;  Location: [facility];  Service: Endoscopy;  Laterality: N/A;  . HIP ARTHROPLASTY Right 12/21/2015   Procedure: ARTHROPLASTY BIPOLAR HIP (HEMIARTHROPLASTY);  Surgeon: [name], MD;  Location: [facility];  Service: Orthopedics;  Laterality: Right;  . PARTIAL HYSTERECTOMY      Allergies  Allergen Reactions  . Amoxicillin     Per daughter  . Ampicillin     Per daughter  . Depakote [Valproic Acid]     DOES NOT WANT HER TO TAKE IT==Makes her hair fall out= per daughter who is poa  . Macrobid [Nitrofurantoin Macrocrystal] Rash     Per MAR  . Penicillins     Per daughter  . Acetaminophen Other (See Comments)    jaundice  . Azithromycin Other (See Comments)    Per MAR  . Butalbital   . Caffeine   . Doxycycline Other (See Comments)    Per MAR  . Escitalopram Oxalate Other (See Comments)    Per MAR  . Fioricet [Butalbital-Apap-Caffeine] Other (See Comments)    Drunk. Per MAR  . Latex Other (See Comments)    Per MAR  . Levofloxacin Other (See Comments)    Per MAR  . Levofloxacin   . Nsaids Other (See Comments)    This is not an allergy. The patient was told by Dr. Rockne Menghini to avoid NSAIDs and stop Vicoprofen in to 2014 to avoid nephrotoxicity.  Per MAR.  Marland Kitchen Oxycodone Other (See Comments)    reaction to synthetic codeine Per MAR  . Restasis [Cyclosporine] Other (See Comments)    Per MAR  . Sulfa Antibiotics Other (See Comments)    Per MAR  . Biaxin [Clarithromycin] Rash    With burning sensation Per  MAR    Allergies as of 07/14/2019      Reactions   Amoxicillin    Per daughter   Ampicillin    Per daughter   Depakote [valproic Acid]    DOES NOT WANT HER TO TAKE IT==Makes her hair fall out= per daughter who is poa   Macrobid [nitrofurantoin Macrocrystal] Rash   Per MAR   Penicillins    Per daughter   Acetaminophen Other (See Comments)   jaundice   Azithromycin Other (See Comments)   Per MAR   Butalbital    Caffeine    Doxycycline Other (See Comments)   Per MAR   Escitalopram Oxalate Other (See Comments)   Per MAR   Fioricet [butalbital-apap-caffeine] Other (See Comments)   Drunk. Per MAR   Latex Other (See Comments)   Per MAR   Levofloxacin Other (See Comments)   Per MAR   Levofloxacin    Nsaids Other (See Comments)   This is not an allergy. The patient was told by Dr. Rockne Menghini to avoid NSAIDs and stop Vicoprofen in to 2014 to avoid nephrotoxicity. Per MAR.   Oxycodone Other (See Comments)   reaction to synthetic codeine Per MAR   Restasis [cyclosporine] Other (See Comments)   Per MAR    Sulfa Antibiotics Other (See Comments)   Per MAR   Biaxin [clarithromycin] Rash   With burning sensation Per [facility]      Medication List       Accurate as of July 14, 2019  2:52 PM. If you have any questions, ask your nurse or doctor.        STOP taking these medications   potassium chloride SA 20 MEQ tablet Commonly known as: KLOR-CON Stopped by: Granville Lewis, PA-C   travoprost (benzalkonium) 0.004 % ophthalmic solution Commonly known as: TRAVATAN Stopped by: Granville Lewis, PA-C     TAKE these medications   acetaminophen 500 MG tablet Commonly known as: TYLENOL Give 2 tablets ( 1,000 mg total ) by mouth nightly for pain   acetaminophen 325 MG tablet Commonly known as: TYLENOL Take 650 mg by mouth every 6 (six) hours as needed.   albuterol 108 (90 Base) MCG/ACT inhaler Commonly known as: VENTOLIN HFA Inhale 1 puff into the lungs every 6 (six) hours as needed for shortness of breath.   aspirin 81 MG chewable tablet Chew 81 mg by mouth daily.   Biotin 5000 MCG Caps Take 1 capsule by mouth daily.   bisacodyl 10 MG suppository Commonly known as: DULCOLAX If not relieved by MOM, give 10 mg Bisacodyl suppositiory rectally X 1 dose in 24 hours as needed (Do not use constipation standing orders for residents with renal failure/CFR less than 30. Contact MD for orders) (Physician Order)   citalopram 10 MG tablet Commonly known as: CELEXA Take 10 mg by mouth daily.   Claritin 10 MG tablet Generic drug: loratadine Take 10 mg by mouth daily.   Cranberry 450 MG Tabs Take 1 tablet by mouth 2 (two) times daily.   docusate sodium 100 MG capsule Commonly known as: COLACE Take 100 mg by mouth 2 (two) times daily.   dorzolamide-timolol 22.3-6.8 MG/ML ophthalmic solution Commonly known as: COSOPT Place 1 drop into both eyes 2 (two) times daily.   Ensure Take 237 mLs by mouth 4 (four) times daily. D/t weight loss   ferrous sulfate 325 (65 FE) MG tablet Take 325 mg by mouth  daily.   fludrocortisone 0.1 MG tablet Commonly known as: FLORINEF Take 0.1  mg by mouth every morning. for orthostatic hypotension   lactulose 10 GM/15ML solution Commonly known as: CHRONULAC Take 15 mLs (10 g total) by mouth daily as needed for severe constipation.   latanoprost 0.005 % ophthalmic solution Commonly known as: XALATAN Place 1 drop into both eyes at bedtime.   lidocaine 5 % Commonly known as: LIDODERM APPLY 1 PATCH TO ANT. LEFT THIGH DAILY LEAVE ON X 12 HOURS THEN REMOVE   magnesium hydroxide 400 MG/5ML suspension Commonly known as: MILK OF MAGNESIA If no BM in 3 days, give 30 cc Milk of Magnesium p.o. x 1 dose in 24 hours as needed (Do not use standing constipation orders for residents with renal failure CFR less than 30. Contact MD for orders) (Physician Order)   mirtazapine 15 MG tablet Commonly known as: REMERON Take 7.5 mg by mouth at bedtime.   nitroGLYCERIN 0.4 MG SL tablet Commonly known as: NITROSTAT Place 0.4 mg under the tongue every 5 (five) minutes as needed for chest pain. May use 3 times   NON FORMULARY Diet: Pureed Diet, Honey Thickened Liquids - to be given by spoon.   polyethylene glycol 17 g packet Commonly known as: MIRALAX / GLYCOLAX Take 17 g by mouth daily.   QUEtiapine 100 MG tablet Commonly known as: SEROQUEL Take 100 mg by mouth at bedtime. Take along with a 25 mg tablet to = 125 mg qhs   QUEtiapine 25 MG tablet Commonly known as: SEROQUEL Take 25 mg by mouth at bedtime. Take along with a 100 mg tablet to = 125 mg qhs   RA SALINE ENEMA RE If not relieved by Biscodyl suppository, give disposable Saline Enema rectally X 1 dose/24 hrs as needed (Do not use constipation standing orders for residents with renal failure/CFR less than 30. Contact MD for orders)(Physician Or   senna 8.6 MG tablet Commonly known as: SENOKOT Take 2 tablets by mouth 2 (two) times daily as needed for constipation.   Systane 0.4-0.3 % Soln Generic drug:  Polyethyl Glycol-Propyl Glycol Place 2 drops into both eyes every 2 (two) hours as needed (dry eyes).   vitamin B-12 1000 MCG tablet Commonly known as: CYANOCOBALAMIN Take 1,500 mcg by mouth daily. 1-1/2 tablets to = 1500 mcg   Vitamin D3 1.25 MG (50000 UT) Caps Take 1 capsule by mouth once a week.       Review of Systems   This is limited secondary to dementia-nursing does not report any acute issues-she is not complaining of any pain today-or shortness of breath.   Her main concern is she would like to get back to bed Immunization History  Administered Date(s) Administered  . Influenza Split 11/13/2011  . Influenza-Unspecified 02/13/2008, 11/30/2013, 12/20/2016  . Moderna SARS-COVID-2 Vaccination 02/12/2019, 03/12/2019  . PPD Test 12/15/2015  . Pneumococcal Conjugate-13 10/16/2016  . Pneumococcal-Unspecified 02/13/2008  . Td 02/12/2009   Pertinent  Health Maintenance Due  Topic Date Due  . INFLUENZA VACCINE  09/13/2019  . DEXA SCAN  Completed  . PNA vac Low Risk Adult  Completed   Fall Risk  08/27/2017 08/22/2016  Falls in the past year? No Yes  Number falls in past yr: - 2 or more  Injury with Fall? - Yes  Comment - back of head   Functional Status Survey:    Vitals:   07/14/19 1431  BP: (!) 167/85  Pulse: 67  Resp: 17  Temp: 98.2 F (36.8 C)  TempSrc: Oral  SpO2: 94%  Weight: 66 lb 12.8 oz (  30.3 kg)  Height: 4\' 11"  (1.499 m)  Blood pressures appear to be variable ranging from the 329J to 242A systolically -I do not see consistent elevations over 834 systolically but this will need to be watched Body mass index is 13.49 kg/m. Physical Exam In general this is a frail elderly female in no distress sitting comfortably in her wheelchair.  Her skin is warm and dry sacral ulcer cannot be assessed secondary to patient positioning.  Eyes she is legally blind sclera and conjunctive appear clear.  Oropharynx appears fairly clear mucous membranes fairly  moist.  Chest is clear to auscultation with shallow air entry there is no labored breathing.  Heart is regular rate and rhythm without murmur gallop or rub she does not have significant lower extremity edema.  Abdomen is soft nontender with positive bowel sounds.  Musculoskeletal is able to move all extremities x4 with stiffness of her lower extremities there does not appear to be pain with extension and flexion of the left hip area.  Neurologic appears grossly intact cannot appreciate lateralizing findings she does not speak much.  And psych findings consistent with dementia she is oriented to self will follow some simple verbal commands Labs reviewed: Recent Labs    03/27/19 0000 04/28/19 0000 07/01/19 0000  NA 141 145 142  K 5.0 3.5 3.3*  CL 105 105 101  CO2 26* 25* 28*  BUN 28* 41* 25*  CREATININE 0.7 1.4* 0.9  CALCIUM 10.1 13.5* 11.1*   Recent Labs    09/02/18 0000 03/27/19 0000  AST 17 17  ALT 9 10  ALKPHOS 98 93  ALBUMIN  --  3.7   Recent Labs    11/22/18 0000 01/28/19 0000 03/27/19 0000  WBC 8.3 6.1 6.4  NEUTROABS 6 4 3   HGB 12.3 12.0 13.1  HCT 37 37 39  PLT 234 227 218   Lab Results  Component Value Date   TSH 0.59 07/31/2017   Lab Results  Component Value Date   HGBA1C 5.4 06/11/2016   Lab Results  Component Value Date   CHOL 134 07/31/2017   HDL 54 06/11/2016   LDLCALC 61 07/31/2017   TRIG 102 07/31/2017    Significant Diagnostic Results in last 30 days:  No results found.  Assessment/Plan  #1 history of advanced dementia-as noted above Dr. Mariea Clonts is helping have a meeting with her daughter and discuss further goals of care.  She does have a history of weight loss which appears to have stabilized recently she is on Remeron for appetite stimulation and does require assistance with feeding.  At this point continue supportive care.  2.  History of hypercalcemia this appears to have moderated-will try to update a lab with patient will  allow a lab draw.  It is suspected she could not really tolerate aggressive work-up or treatment if this is persistent.  3.  History of anemia of chronic disease-last hemoglobin was 13.1 back in February will try to have this updated as well I suspect there is an element of chronic disease here.  She currently is on iron 325 mg a day.  4.  History of paranoia she is on Seroquel 125 mg nightly.  At this point monitor this appears to be controlled from what I can ascertain.  5.  History of chronic kidney disease-actually lab on May 19 showed stability with a creatinine of 0.88 BUN of 25.4.  Again will try to update this and continue to encourage fluids and supplements.  6.  History of mild hypokalemia this has been supplemented will await an updated lab to see if this should continue last potassium was 3.3 we did give her a limited course of 20 mEq of potassium a day which has since been discontinued.  7.  History of COPD appears stable she does have as needed albuterol.  8.  History of left thigh discomfort this appears to be improved she does have orders for Lidoderm patch as well as Tylenol.  9.  History of depression she continues on Celexa 10 mg a day.  10.  History of  hypotension she is on Florinef in the morning for history of hypotension-again she does have variable systolics as noted above recent systolics ranging [id number] do not see consistent elevations over 140-at this point will monitor  Again will try to update labs including a CBC and metabolic panel.  [id number]

## 2019-07-15 ENCOUNTER — Encounter: Payer: Self-pay | Admitting: Internal Medicine

## 2019-07-15 LAB — VITAMIN B12: Vitamin B-12: >2000

## 2019-07-15 LAB — HEPATIC FUNCTION PANEL
ALT: 7 (ref 7–35)
AST: 13 (ref 13–35)
Alkaline Phosphatase: 93 (ref 25–125)
Bilirubin, Total: 0.2

## 2019-07-15 LAB — BASIC METABOLIC PANEL
BUN: 29 — AB (ref 4–21)
CO2: 27 — AB (ref 13–22)
Chloride: 103 (ref 99–108)
Creatinine: 0.9 (ref 0.5–1.1)
Glucose: 81
Potassium: 3.5 (ref 3.4–5.3)
Sodium: 144 (ref 137–147)

## 2019-07-15 LAB — COMPREHENSIVE METABOLIC PANEL
Albumin: 3.6 (ref 3.5–5.0)
Calcium: 10.7 (ref 8.7–10.7)
GFR calc Af Amer: 64.35
GFR calc non Af Amer: 55.52
Globulin: 2.4

## 2019-07-15 LAB — CBC AND DIFFERENTIAL
HCT: 32 — AB (ref 36–46)
Hemoglobin: 10.7 — AB (ref 12.0–16.0)
Platelets: 257 (ref 150–399)
WBC: 5.9

## 2019-07-15 LAB — CBC: RBC: 3.59 — AB (ref 3.87–5.11)

## 2019-07-15 LAB — VITAMIN D 25 HYDROXY (VIT D DEFICIENCY, FRACTURES): Vit D, 25-Hydroxy: 90.5

## 2019-07-21 LAB — BASIC METABOLIC PANEL
BUN: 29 — AB (ref 4–21)
CO2: 27 — AB (ref 13–22)
Chloride: 106 (ref 99–108)
Creatinine: 1 (ref 0.5–1.1)
Glucose: 93
Potassium: 3.5 (ref 3.4–5.3)
Sodium: 144 (ref 137–147)

## 2019-07-21 LAB — COMPREHENSIVE METABOLIC PANEL
Calcium: 12.1 — AB (ref 8.7–10.7)
GFR calc Af Amer: 60.35
GFR calc non Af Amer: 52.07

## 2019-08-03 ENCOUNTER — Non-Acute Institutional Stay (SKILLED_NURSING_FACILITY): Payer: Medicare Other | Admitting: Internal Medicine

## 2019-08-03 DIAGNOSIS — J449 Chronic obstructive pulmonary disease, unspecified: Secondary | ICD-10-CM

## 2019-08-03 DIAGNOSIS — R05 Cough: Secondary | ICD-10-CM | POA: Diagnosis not present

## 2019-08-03 DIAGNOSIS — E559 Vitamin D deficiency, unspecified: Secondary | ICD-10-CM

## 2019-08-03 DIAGNOSIS — R059 Cough, unspecified: Secondary | ICD-10-CM

## 2019-08-03 DIAGNOSIS — E538 Deficiency of other specified B group vitamins: Secondary | ICD-10-CM

## 2019-08-03 NOTE — Progress Notes (Signed)
This is an acute visit.  Level of care skilled.  Facility [facility].  Chief complaint acute visit secondary to cough.  History of present illness.  Patient is an 86 year old female who has been a long-term resident of the facility..  She has a history of advanced dementia as well as paranoia bipolar disorder hypertension anemia COPD as well as a history of elevated calciums.  Staff has noted she has an increased cough-vital signs appear to be stable.  Patient is somewhat of a poor historian secondary to dementia but she denies any of shortness of breath to me.  She is not febrile.  She does have a history of COPD and does have orders for an albuterol inhaler every 6 hours as needed-not sure however how often she is compliant with that  Past Medical History:  Diagnosis Date  . Anxiety   . Arterial tortuosity (aorta) 12/11/2012  . Asthma   . Chronic kidney disease, stage IV (severe) ([place]) 12/11/2012  . Chronic mental illness   . CKD (chronic kidney disease), stage III 01/14/2015  . Closed hip fracture requiring operative repair, right, with routine healing, subsequent encounter 12/19/2015  . Colitis   . COPD (chronic obstructive pulmonary disease) ([place])   . Decreased rectal sphincter tone 11/21/2012  . Depression   . Drug-seeking behavior 02/26/2011   Use of multiple md to obtain benzos/narcotics pt must sign contract to receive controlled meds.   Marland Kitchen Dysphagia 01/11/2015  . GERD (gastroesophageal reflux disease)   . Glaucoma 06/26/2011  . Glaucoma of both eyes   . Hearing loss, sensorineural 03/04/2014   [facility]; 02/26/14. There is not a medical or surgical treatment that would benefit her type of hearing loss. We discussed amplification in an overview fashion.    Marland Kitchen History of fall   . Insomnia, unspecified   . Macular degeneration 06/26/2011  . Migraines   . Nasal bone fracture 03/30/2014  . Osteoporosis   . Osteoporosis 02/26/2011  . Presence of  right artificial hip joint   . Protein-calorie malnutrition, severe ([place]) 12/10/2012  . S/P ORIF (open reduction internal fixation) fracture 01/08/2016  . Schatzki's ring 01/14/2015  . Scoliosis   . Substance abuse ([place])    Benbzos and Narcotics, she does not get any of those medicines from [facility], we have empathetically told her that.  . Vertigo   . Vitamin B deficiency   . Vitamin D deficiency 08/25/2016  . Weight loss         Past Surgical History:  Procedure Laterality Date  . ABDOMINAL HYSTERECTOMY    . CATARACT EXTRACTION    . CHOLECYSTECTOMY    . ESOPHAGOGASTRODUODENOSCOPY (EGD) WITH PROPOFOL N/A 01/13/2015   Procedure: ESOPHAGOGASTRODUODENOSCOPY (EGD) WITH PROPOFOL;  Surgeon: [name], MD;  Location: [facility];  Service: Endoscopy;  Laterality: N/A;  . HIP ARTHROPLASTY Right 12/21/2015   Procedure: ARTHROPLASTY BIPOLAR HIP (HEMIARTHROPLASTY);  Surgeon: [name], MD;  Location: [place];  Service: Orthopedics;  Laterality: Right;  . PARTIAL HYSTERECTOMY           Allergies  Allergen Reactions  . Amoxicillin     Per daughter  . Ampicillin     Per daughter  . Depakote [Valproic Acid]     DOES NOT WANT HER TO TAKE IT==Makes her hair fall out= per daughter who is poa  . Macrobid [Nitrofurantoin Macrocrystal] Rash    Per MAR  . Penicillins     Per daughter  . Acetaminophen Other (See  Comments)    jaundice  . Azithromycin Other (See Comments)    Per MAR  . Butalbital   . Caffeine   . Doxycycline Other (See Comments)    Per MAR  . Escitalopram Oxalate Other (See Comments)    Per MAR  . Fioricet [Butalbital-Apap-Caffeine] Other (See Comments)    Drunk. Per MAR  . Latex Other (See Comments)    Per MAR  . Levofloxacin Other (See Comments)    Per MAR  . Levofloxacin   . Nsaids Other (See Comments)    This is not an allergy. The patient was told by Dr. Rockne Menghini to avoid NSAIDs and stop Vicoprofen in to  2014 to avoid nephrotoxicity.  Per MAR.  Marland Kitchen Oxycodone Other (See Comments)    reaction to synthetic codeine Per MAR  . Restasis [Cyclosporine] Other (See Comments)    Per MAR  . Sulfa Antibiotics Other (See Comments)    Per MAR  . Biaxin [Clarithromycin] Rash    With burning sensation Per MAR         Allergies as of 07/14/2019      Reactions   Amoxicillin    Per daughter   Ampicillin    Per daughter   Depakote [valproic Acid]    DOES NOT WANT HER TO TAKE IT==Makes her hair fall out= per daughter who is poa   Macrobid [nitrofurantoin Macrocrystal] Rash   Per MAR   Penicillins    Per daughter   Acetaminophen Other (See Comments)   jaundice   Azithromycin Other (See Comments)   Per MAR   Butalbital    Caffeine    Doxycycline Other (See Comments)   Per MAR   Escitalopram Oxalate Other (See Comments)   Per MAR   Fioricet [butalbital-apap-caffeine] Other (See Comments)   Drunk. Per MAR   Latex Other (See Comments)   Per MAR   Levofloxacin Other (See Comments)   Per MAR   Levofloxacin    Nsaids Other (See Comments)   This is not an allergy. The patient was told by Dr. Rockne Menghini to avoid NSAIDs and stop Vicoprofen in to 2014 to avoid nephrotoxicity. Per MAR.   Oxycodone Other (See Comments)   reaction to synthetic codeine Per MAR   Restasis [cyclosporine] Other (See Comments)   Per MAR   Sulfa Antibiotics Other (See Comments)   Per MAR   Biaxin [clarithromycin] Rash   With burning sensation Per MAR         Medication List                      TAKE these medications   acetaminophen 500 MG tablet Commonly known as: TYLENOL Give 2 tablets ( 1,000 mg total ) by mouth nightly for pain   acetaminophen 325 MG tablet Commonly known as: TYLENOL Take 650 mg by mouth every 6 (six) hours as needed.   albuterol 108 (90 Base) MCG/ACT inhaler Commonly known as: VENTOLIN HFA Inhale 1 puff into the  lungs every 6 (six) hours as needed for shortness of breath.   aspirin 81 MG chewable tablet Chew 81 mg by mouth daily.   Biotin 5000 MCG Caps Take 1 capsule by mouth daily.   bisacodyl 10 MG suppository Commonly known as: DULCOLAX If not relieved by MOM, give 10 mg Bisacodyl suppositiory rectally X 1 dose in 24 hours as needed (Do not use constipation standing orders for residents with renal failure/CFR less than 30. Contact MD for orders) (Physician Order)  citalopram 10 MG tablet Commonly known as: CELEXA Take 10 mg by mouth daily.   Claritin 10 MG tablet Generic drug: loratadine Take 10 mg by mouth daily.   Cranberry 450 MG Tabs Take 1 tablet by mouth 2 (two) times daily.   docusate sodium 100 MG capsule Commonly known as: COLACE Take 100 mg by mouth 2 (two) times daily.   dorzolamide-timolol 22.3-6.8 MG/ML ophthalmic solution Commonly known as: COSOPT Place 1 drop into both eyes 2 (two) times daily.   Ensure Take 237 mLs by mouth 4 (four) times daily. D/t weight loss   ferrous sulfate 325 (65 FE) MG tablet Take 325 mg by mouth daily.   fludrocortisone 0.1 MG tablet Commonly known as: FLORINEF Take 0.1 mg by mouth every morning. for orthostatic hypotension   lactulose 10 GM/15ML solution Commonly known as: CHRONULAC Take 15 mLs (10 g total) by mouth daily as needed for severe constipation.   latanoprost 0.005 % ophthalmic solution Commonly known as: XALATAN Place 1 drop into both eyes at bedtime.   lidocaine 5 % Commonly known as: LIDODERM APPLY 1 PATCH TO ANT. LEFT THIGH DAILY LEAVE ON X 12 HOURS THEN REMOVE   magnesium hydroxide 400 MG/5ML suspension Commonly known as: MILK OF MAGNESIA If no BM in 3 days, give 30 cc Milk of Magnesium p.o. x 1 dose in 24 hours as needed (Do not use standing constipation orders for residents with renal failure CFR less than 30. Contact MD for orders) (Physician Order)   mirtazapine 15 MG tablet Commonly  known as: REMERON Take 7.5 mg by mouth at bedtime.   nitroGLYCERIN 0.4 MG SL tablet Commonly known as: NITROSTAT Place 0.4 mg under the tongue every 5 (five) minutes as needed for chest pain. May use 3 times   NON FORMULARY Diet: Pureed Diet, Honey Thickened Liquids - to be given by spoon.   polyethylene glycol 17 g packet Commonly known as: MIRALAX / GLYCOLAX Take 17 g by mouth daily.   QUEtiapine 100 MG tablet Commonly known as: SEROQUEL Take 100 mg by mouth at bedtime. Take along with a 25 mg tablet to = 125 mg qhs   QUEtiapine 25 MG tablet Commonly known as: SEROQUEL Take 25 mg by mouth at bedtime. Take along with a 100 mg tablet to = 125 mg qhs   RA SALINE ENEMA RE If not relieved by Biscodyl suppository, give disposable Saline Enema rectally X 1 dose/24 hrs as needed (Do not use constipation standing orders for residents with renal failure/CFR less than 30. Contact MD for orders)(Physician Or   senna 8.6 MG tablet Commonly known as: SENOKOT Take 2 tablets by mouth 2 (two) times daily as needed for constipation.   Systane 0.4-0.3 % Soln Generic drug: Polyethyl Glycol-Propyl Glycol Place 2 drops into both eyes every 2 (two) hours as needed (dry eyes).   vitamin B-12 1000 MCG tablet Commonly known as: CYANOCOBALAMIN Take 1,500 mcg by mouth daily. 1-1/2 tablets to = 1500 mcg   Vitamin D3 1.25 MG (50000 UT) Caps Take 1 capsule by mouth once a\every other week        She also continues on potassium 10 mEq a day  You have systems-this is limited secondary to dementia provided by nursing as well.  In general no complaints of fever or chills.  Skin no complaints of diaphoresis rashes or itching.  Head ears eyes nose mouth and throat-does not complain of a sore throat she is blind.  Respiratory has a cough apparently  nonproductive-does not really complain of shortness of breath.  Cardiac does not complain of chest pain or edema.  GI no complaints of  nausea vomiting diarrhea constipation been noted.  Musculoskeletal at times has complained of some left thigh pain but this appears to have moderated.  Neurologic is not complaining of dizziness headache syncope nursing is not noted acute issues to my knowledge.  And psych does have a history of significant dementia with bipolar disorder nursing does not report any recent acute behaviors.  Physical exam.  Temperature 98.7 pulse 75 respiration 17 blood pressure 143/83.  In general this is a frail elderly female in no distress sitting in her wheelchair.  Her skin is warm and dry she does have a history of sacral ulcer which is followed by wound care was not assessed secondary to patient positioning.  Eyes she is legally blind sclera and conjunctive appear clear.  Oropharynx appears to be clear.  Chest could not really appreciate any significant congestion there is somewhat shallow air entry but this is baseline there is no labored breathing.  Heart is regular rate and rhythm without murmur gallop or rub she does not have significant lower extremity edema.  Abdomen is soft nontender with positive bowel sounds.  Musculoskeletal has general frailty but appears able to move all extremities x4 with lower extremity weakness.  Neurologic appears grossly intact speech is relatively clear but she does not speak a whole lot.  Psych findings consistent with significant dementia she does not appear overtly anxious.  Labs.  July 21, 2019.  Sodium 144 potassium 3.5 BUN 29 creatinine 0.97.  Calcium was 12.1.  July 15, 2019.  Sodium 144 potassium 3.5 BUN 28.6 creatinine 0.92.  Liver function tests within normal limits.  Calcium was 10.7.  WBC 5.9 hemoglobin 10.7 platelets 257.  B12 level was greater than 2000.  Vitamin D level was 90.5  Down from 1500 mcg daily Assessment and plan.  1.-Cough-she does have a history of COPD-will order a chest x-ray PA and lateral and await  findings.  Also will start Mucinex 600 mg twice daily for 5 days to see if this may help.  Lung exam was fairly benign at this point continue to monitor with vital signs pulse ox for 72 hours for any changes.  Does have albuterol inhalers as needed not sure how compliant she will be with this however.  2.  History of vitamin D level of 90.5 we have reduced her vitamin D supplementation to every other week.  3.  B12 level greater than 2000 we will reduce her B12 dose down to 1000 mcg a day down from 1500 mcg a day.  Of note also will attempt to obtain a updated metabolic panel when patient will allow-she does at times refuse blood draws.  4.  Hypertension-reviewed her blood pressures systolics appear to run from the 130s to 140s generally-at this point will monitor considering her advanced age and comorbidities and concerns for falls will be somewhat conservative here--she currently is not on any antihypertensives   [phone]

## 2019-08-04 ENCOUNTER — Encounter: Payer: Self-pay | Admitting: Internal Medicine

## 2019-08-04 LAB — BASIC METABOLIC PANEL
BUN: 39 — AB (ref 4–21)
CO2: 26 — AB (ref 13–22)
Chloride: 107 (ref 99–108)
Creatinine: 1.5 — AB (ref 0.5–1.1)
Glucose: 104
Potassium: 3.4 (ref 3.4–5.3)
Sodium: 149 — AB (ref 137–147)

## 2019-08-04 LAB — COMPREHENSIVE METABOLIC PANEL
Calcium: 12.8 — AB (ref 8.7–10.7)
GFR calc Af Amer: 36.5
GFR calc non Af Amer: 31.49

## 2019-08-05 ENCOUNTER — Encounter: Payer: Self-pay | Admitting: Internal Medicine

## 2019-08-05 ENCOUNTER — Non-Acute Institutional Stay (SKILLED_NURSING_FACILITY): Payer: Medicare Other | Admitting: Internal Medicine

## 2019-08-05 DIAGNOSIS — E875 Hyperkalemia: Secondary | ICD-10-CM

## 2019-08-05 DIAGNOSIS — R5383 Other fatigue: Secondary | ICD-10-CM | POA: Diagnosis not present

## 2019-08-05 DIAGNOSIS — R059 Cough, unspecified: Secondary | ICD-10-CM

## 2019-08-05 DIAGNOSIS — R05 Cough: Secondary | ICD-10-CM | POA: Diagnosis not present

## 2019-08-05 NOTE — Progress Notes (Signed)
Location:    [facility] Room Number: 304/D Place of Service:  SNF [phone]) Provider:  Leda Roys, DO  Patient Care Team: Gayland Curry, DO as PCP - General (Geriatric Medicine) [name], MD (Gastroenterology) [name], NP (Obstetrics and Gynecology) Re[facility], [facility] ([place])  Extended Emergency Contact Information Primary Emergency Contact: [name],[name] Address: [address], [place] Phone: [phone] Mobile Phone: [phone] Relation: Daughter Secondary Emergency Contact: [place] Phone: [phone] Mobile Phone: [phone] Relation: Daughter  Code Status:  DNR Goals of care: Advanced Directive information Advanced Directives 08/05/2019  Does Patient Have a Medical Advance Directive? Yes  Type of Advance Directive Out of facility DNR (pink MOST or yellow form)  Does patient want to make changes to medical advance directive? No - Patient declined  Copy of [place] in Chart? -  Would patient like information on creating a medical advance directive? -  Pre-existing out of facility DNR order (yellow form or pink MOST form) Yellow form placed in chart (order not valid for inpatient use)     Chief Complaint  Patient presents with  . Follow-up    Cough  As well as hypercalcemia and hyponatremia as well as rising creatinine  HPI:  Pt is a 86 y.o. female seen today for an acute visit for follow-up of cough as well as some increased lethargy.  Patient was seen earlier this week for a cough the chest x-ray showed was pretty nonspecific-it did show possibility?  Of developing pneumonia but was quite equivocal -she was not having any fever chills vital signs were stable-with her numerous antibiotic allergies we waited and had her monitored clinically-she was prescribed  Mucinex Today however she has become  more lethargic does not appear as energetic.  We have also ordered labs which showed her calcium continues to rise at 12.8 and creatinine is up somewhat from her baseline at 1.47.  And sodium is 149  I did discuss her status with her daughter via phone-Per previous discussions with Dr. [name] there were considerations of comfort care-but per discussion with Piper's daughter today-she is open to doing antibiotics and ordering further labs if needed as well as doing IVs--she would prefer no hospitalization.  Her vital signs currently are stable as well as oxygen saturations.  But she is more lethargic.     Past Medical History:  Diagnosis Date  . Anxiety   . Arterial tortuosity (aorta) 12/11/2012  . Asthma   . Chronic kidney disease, stage IV (severe) ([place]) 12/11/2012  . Chronic mental illness   . CKD (chronic kidney disease), stage III 01/14/2015  . Closed hip fracture requiring operative repair, right, with routine healing, subsequent encounter 12/19/2015  . Colitis   . COPD (chronic obstructive pulmonary disease) ([place])   . Decreased rectal sphincter tone 11/21/2012  . Depression   . Drug-seeking behavior 02/26/2011   Use of multiple md to obtain benzos/narcotics pt must sign contract to receive controlled meds.   Marland Kitchen Dysphagia 01/11/2015  . GERD (gastroesophageal reflux disease)   . Glaucoma 06/26/2011  . Glaucoma of both eyes   . Hearing loss, sensorineural 03/04/2014   [facility]; 02/26/14. There is not a medical or surgical treatment that would benefit her type of hearing loss. We discussed amplification in an overview fashion.    Marland Kitchen  History of fall   . Insomnia, unspecified   . Macular degeneration 06/26/2011  . Migraines   . Nasal bone fracture 03/30/2014  . Osteoporosis   . Osteoporosis 02/26/2011  . Presence of right artificial hip joint   . Protein-calorie malnutrition, severe ([place]) 12/10/2012  . S/P ORIF (open reduction internal  fixation) fracture 01/08/2016  . Schatzki's ring 01/14/2015  . Scoliosis   . Substance abuse ([place])    Benbzos and Narcotics, she does not get any of those medicines from [facility], we have empathetically told her that.  . Vertigo   . Vitamin B deficiency   . Vitamin D deficiency 08/25/2016  . Weight loss    Past Surgical History:  Procedure Laterality Date  . ABDOMINAL HYSTERECTOMY    . CATARACT EXTRACTION    . CHOLECYSTECTOMY    . ESOPHAGOGASTRODUODENOSCOPY (EGD) WITH PROPOFOL N/A 01/13/2015   Procedure: ESOPHAGOGASTRODUODENOSCOPY (EGD) WITH PROPOFOL;  Surgeon: [name], MD;  Location: [facility];  Service: Endoscopy;  Laterality: N/A;  . HIP ARTHROPLASTY Right 12/21/2015   Procedure: ARTHROPLASTY BIPOLAR HIP (HEMIARTHROPLASTY);  Surgeon: [name], MD;  Location: [facility];  Service: Orthopedics;  Laterality: Right;  . PARTIAL HYSTERECTOMY      Allergies  Allergen Reactions  . Amoxicillin     Per daughter  . Ampicillin     Per daughter  . Depakote [Valproic Acid]     DOES NOT WANT HER TO TAKE IT==Makes her hair fall out= per daughter who is poa  . Macrobid [Nitrofurantoin Macrocrystal] Rash    Per MAR  . Penicillins     Per daughter  . Acetaminophen Other (See Comments)    jaundice  . Azithromycin Other (See Comments)    Per MAR  . Butalbital   . Caffeine   . Doxycycline Other (See Comments)    Per MAR  . Escitalopram Oxalate Other (See Comments)    Per MAR  . Fioricet [Butalbital-Apap-Caffeine] Other (See Comments)    Drunk. Per MAR  . Latex Other (See Comments)    Per MAR  . Levofloxacin Other (See Comments)    Per MAR  . Levofloxacin   . Nsaids Other (See Comments)    This is not an allergy. The patient was told by Dr. Rockne Menghini to avoid NSAIDs and stop Vicoprofen in to 2014 to avoid nephrotoxicity.  Per MAR.  Marland Kitchen Oxycodone Other (See Comments)    reaction to synthetic codeine Per MAR  . Restasis [Cyclosporine] Other (See Comments)    Per MAR  . Sulfa  Antibiotics Other (See Comments)    Per MAR  . Biaxin [Clarithromycin] Rash    With burning sensation Per [facility]    Outpatient Encounter Medications as of 08/05/2019  Medication Sig  . acetaminophen (TYLENOL) 325 MG tablet Take 650 mg by mouth every 6 (six) hours as needed.  Marland Kitchen acetaminophen (TYLENOL) 500 MG tablet Give 2 tablets ( 1,000 mg total ) by mouth nightly for pain  . albuterol (PROVENTIL HFA;VENTOLIN HFA) 108 (90 BASE) MCG/ACT inhaler Inhale 1 puff into the lungs every 6 (six) hours as needed for shortness of breath.   Marland Kitchen aspirin 81 MG chewable tablet Chew 81 mg by mouth daily.  . Biotin 5000 MCG CAPS Take 1 capsule by mouth daily.   . bisacodyl (DULCOLAX) 10 MG suppository If not relieved by MOM, give 10 mg Bisacodyl suppositiory rectally X 1 dose in 24 hours as needed (Do not use constipation standing orders for residents with renal failure/CFR less  than 30. Contact MD for orders) (Physician Order)  . cefTRIAXone (ROCEPHIN) IVPB Inject 1 g into the vein daily.  . Cholecalciferol (VITAMIN D3) 1.25 MG (50000 UT) CAPS Take 1 capsule by mouth once a week.  . citalopram (CELEXA) 10 MG tablet Take 10 mg by mouth daily.  . Cranberry 450 MG TABS Take 1 tablet by mouth 2 (two) times daily.   Marland Kitchen docusate sodium (COLACE) 100 MG capsule Take 100 mg by mouth 2 (two) times daily.  . dorzolamide-timolol (COSOPT) 22.3-6.8 MG/ML ophthalmic solution Place 1 drop into both eyes 2 (two) times daily.  Marland Kitchen ENSURE (ENSURE) Take 237 mLs by mouth 4 (four) times daily. D/t weight loss  . ferrous sulfate 325 (65 FE) MG tablet Take 325 mg by mouth daily.   . fludrocortisone (FLORINEF) 0.1 MG tablet Take 0.1 mg by mouth every morning. for orthostatic hypotension  . guaiFENesin (MUCINEX) 600 MG 12 hr tablet Take 600 mg by mouth 2 (two) times daily.  Marland Kitchen lactulose (CHRONULAC) 10 GM/15ML solution Take 15 mLs (10 g total) by mouth daily as needed for severe constipation.  Marland Kitchen latanoprost (XALATAN) 0.005 % ophthalmic  solution Place 1 drop into both eyes at bedtime.  . lidocaine (LIDODERM) 5 % APPLY 1 PATCH TO ANT. LEFT THIGH DAILY LEAVE ON X 12 HOURS THEN REMOVE  . loratadine (CLARITIN) 10 MG tablet Take 10 mg by mouth daily.   . magnesium hydroxide (MILK OF MAGNESIA) 400 MG/5ML suspension If no BM in 3 days, give 30 cc Milk of Magnesium p.o. x 1 dose in 24 hours as needed (Do not use standing constipation orders for residents with renal failure CFR less than 30. Contact MD for orders) (Physician Order)  . mirtazapine (REMERON) 15 MG tablet Take 7.5 mg by mouth at bedtime.  . nitroGLYCERIN (NITROSTAT) 0.4 MG SL tablet Place 0.4 mg under the tongue every 5 (five) minutes as needed for chest pain. May use 3 times  . NON FORMULARY Diet: Pureed Diet, Honey Thickened Liquids - to be given by spoon.  Vladimir Faster Glycol-Propyl Glycol (SYSTANE) 0.4-0.3 % SOLN Place 2 drops into both eyes every 2 (two) hours as needed (dry eyes).   . polyethylene glycol (MIRALAX / GLYCOLAX) packet Take 17 g by mouth daily.   . potassium chloride (KLOR-CON) 10 MEQ tablet Take 10 mEq by mouth daily.  . QUEtiapine (SEROQUEL) 100 MG tablet Take 100 mg by mouth at bedtime. Take along with a 25 mg tablet to = 125 mg qhs  . QUEtiapine (SEROQUEL) 25 MG tablet Take 25 mg by mouth at bedtime. Take along with a 100 mg tablet to = 125 mg qhs  . senna (SENOKOT) 8.6 MG tablet Take 2 tablets by mouth 2 (two) times daily as needed for constipation.  . sodium chloride 0.45 % solution INFUSE 2 LITERS @@75ML /HR VIA MIDLINE FOR HYDRATION  . Sodium Phosphates (RA SALINE ENEMA RE) If not relieved by Biscodyl suppository, give disposable Saline Enema rectally X 1 dose/24 hrs as needed (Do not use constipation standing orders for residents with renal failure/CFR less than 30. Contact MD for orders)(Physician Or  . vitamin B-12 (CYANOCOBALAMIN) 1000 MCG tablet Take 1,000 mcg by mouth daily.    No facility-administered encounter medications on file as of  08/05/2019.    Review of Systems   Is not really possible secondary patient not speaking much at all-nursing still she is more lethargic not as active-at times she will get agitated previously but is not doing that  today--she does have a continued cough  Immunization History  Administered Date(s) Administered  . Influenza Split 11/13/2011  . Influenza-Unspecified 02/13/2008, 11/30/2013, 12/20/2016  . Moderna SARS-COVID-2 Vaccination 02/12/2019, 03/12/2019  . PPD Test 12/15/2015  . Pneumococcal Conjugate-13 10/16/2016  . Pneumococcal-Unspecified 02/13/2008  . Td 02/12/2009   Pertinent  Health Maintenance Due  Topic Date Due  . INFLUENZA VACCINE  09/13/2019  . DEXA SCAN  Completed  . PNA vac Low Risk Adult  Completed   Fall Risk  08/27/2017 08/22/2016  Falls in the past year? No Yes  Number falls in past yr: - 2 or more  Injury with Fall? - Yes  Comment - back of head   Functional Status Survey:    Temperature is 97.1 pulse 71 respiration 17 blood pressure 107/49 oxygen saturations have been in the 90s on room air Body mass index is 16.08 kg/m. Physical Exam   In general this is a frail elderly female she is responsive but appears lethargic speaking minimally.  Largely keeping her eyes closed.  Her skin is warm and dry.  She is not diaphoretic.  Eyes she is legally blind sclera and conjunctive appear clear.  Oropharynx was difficult to assess since patient did not really open her mouth very wide.  Respiratory did have shallow air entry could not really appreciate overt congestion or any signs of labored breathing.  Heart is regular rate and rhythm without murmur gallop or rub she does not have significant lower extremity edema.  Abdomen is soft nontender with positive bowel sounds.  Musculoskeletal does not really follow verbal commands continues with general frailty appears able to move her extremities at baseline but is not really following verbal commands much at  all.  Neurologic as noted above cannot really appreciate true lateralizing findings she speaks minimally.  Psych as noted above.     Labs reviewed:  August 04, 2019.  Sodium 149 potassium 3.4 BUN 39.1 creatinine 1.47.  Of note calcium is rising at 12.8 was previously 12.1.   Recent Labs    07/01/19 0000 07/15/19 0000 07/21/19 0000  NA 142 144 144  K 3.3* 3.5 3.5  CL 101 103 106  CO2 28* 27* 27*  BUN 25* 29* 29*  CREATININE 0.9 0.9 1.0  CALCIUM 11.1* 10.7 12.1*   Recent Labs    09/02/18 0000 03/27/19 0000 07/15/19 0000  AST 17 17 13   ALT 9 10 7   ALKPHOS 98 93 93  ALBUMIN  --  3.7 3.6   Recent Labs    11/22/18 0000 11/22/18 0000 01/28/19 0000 03/27/19 0000 07/15/19 0000  WBC 8.3   < > 6.1 6.4 5.9  NEUTROABS 6  --  4 3  --   HGB 12.3   < > 12.0 13.1 10.7*  HCT 37   < > 37 39 32*  PLT 234   < > 227 218 257   < > = values in this interval not displayed.   Lab Results  Component Value Date   TSH 0.59 07/31/2017   Lab Results  Component Value Date   HGBA1C 5.4 06/11/2016   Lab Results  Component Value Date   CHOL 134 07/31/2017   HDL 54 06/11/2016   LDLCALC 61 07/31/2017   TRIG 102 07/31/2017    Significant Diagnostic Results in last 30 days:  No results found.  Assessment/Plan  #1 history of cough-with history of rising creatinine as well as calcium and sodium -as noted above chest x-ray was quite nonspecific-however  she appears to be weaker-with more lethargy-continues with a cough-she has numerous drug allergies but has tolerated Rocephin in the past it appears per chart review.  I did have a pretty extensive discussion with her daughter and she would like to avoid avoid hospitalization but would like IV fluids and antibiotic can be started so will start Rocephin 1 g daily for 7 days as well as half-normal saline at 75 cc an hour for 2 L-also will increase her potassium to 30 mEq a day.  She will need updated metabolic panel after the 2 L of  IV fluids  Also will increase her potassium supplementation.  And close monitoring of vital signs every shift for now.  Will await updated labs and clinical monitoring.  Of note later today her oxygen saturations at times did dip into the 80s-oxygen was applied and saturations did increase into the 90s area this again will need close monitoring  [id number]-of note greater than 35 minutes spent assessing patient-reviewing her chart and labs-discussing patient's status with her daughter via phone-as well as nursing staff -and coordinating and formulating a plan of care-of note greater than 50% of time spent coordinating a plan of care with input as noted above

## 2019-08-06 ENCOUNTER — Non-Acute Institutional Stay (SKILLED_NURSING_FACILITY): Payer: Medicare Other | Admitting: Internal Medicine

## 2019-08-06 ENCOUNTER — Encounter: Payer: Self-pay | Admitting: Internal Medicine

## 2019-08-06 DIAGNOSIS — R5383 Other fatigue: Secondary | ICD-10-CM

## 2019-08-06 DIAGNOSIS — R7989 Other specified abnormal findings of blood chemistry: Secondary | ICD-10-CM | POA: Diagnosis not present

## 2019-08-06 DIAGNOSIS — E870 Hyperosmolality and hypernatremia: Secondary | ICD-10-CM | POA: Diagnosis not present

## 2019-08-06 NOTE — Progress Notes (Signed)
Location:    [facility] Room Number: 304/D Place of Service:  SNF [phone]) Provider:  Leda Roys, DO  Patient Care Team: Gayland Curry, DO as PCP - General (Geriatric Medicine) [name], MD (Gastroenterology) [name], NP (Obstetrics and Gynecology) [facility], [facility] ([place])  Extended Emergency Contact Information Primary Emergency Contact: [name],[name] Address: [place], [place] Phone: [phone] Mobile Phone: [phone] Relation: Daughter Secondary Emergency Contact: [place] Phone: [phone] Mobile Phone: [phone] Relation: Daughter  Code Status:  DNR Goals of care: Advanced Directive information Advanced Directives 08/06/2019  Does Patient Have a Medical Advance Directive? Yes  Type of Advance Directive Out of facility DNR (pink MOST or yellow form)  Does patient want to make changes to medical advance directive? No - Patient declined  Copy of [place] in Chart? -  Would patient like information on creating a medical advance directive? -  Pre-existing out of facility DNR order (yellow form or pink MOST form) Yellow form placed in chart (order not valid for inpatient use)     Chief Complaint  Patient presents with  . Follow-up    Lethargy, Hypernatremia    HPI:  Pt is a 86 y.o. female seen today for an acute visit for follow-up of lethargy as well as elevated sodium and a cough.  Patient was initially seen for cough-chest x-ray was rather nonspecific with showing possibility of developing pneumonia-she was monitored but became more lethargic later in the week with a continued cough and we did start her on Rocephin yesterday for 7-day course- Lab work also showed a rising calcium of 12.8 as well as a sodium of 149 and creatinine of 1.47 which was  somewhat above her baseline.  I did have a discussion with her daughter and we have started IV fluids half-normal saline at 75 cc an hour for 2 L which she is currently receiving.  Today she appears to be improving --she is more like herself-- she is more responsive somewhat agitated at times which was not the case yesterday.  Vital signs are stable.     Past Medical History:  Diagnosis Date  . Anxiety   . Arterial tortuosity (aorta) 12/11/2012  . Asthma   . Chronic kidney disease, stage IV (severe) ([place]) 12/11/2012  . Chronic mental illness   . CKD (chronic kidney disease), stage III 01/14/2015  . Closed hip fracture requiring operative repair, right, with routine healing, subsequent encounter 12/19/2015  . Colitis   . COPD (chronic obstructive pulmonary disease) ([place])   . Decreased rectal sphincter tone 11/21/2012  . Depression   . Drug-seeking behavior 02/26/2011   Use of multiple md to obtain benzos/narcotics pt must sign contract to receive controlled meds.   Marland Kitchen Dysphagia 01/11/2015  . GERD (gastroesophageal reflux disease)   . Glaucoma 06/26/2011  . Glaucoma of both eyes   . Hearing loss, sensorineural 03/04/2014   [facility]; 02/26/14. There is not a medical or surgical treatment that would benefit her type of hearing loss. We discussed amplification in an overview fashion.    Marland Kitchen History of fall   . Insomnia, unspecified   . Macular degeneration 06/26/2011  . Migraines   . Nasal bone fracture 03/30/2014  . Osteoporosis   . Osteoporosis 02/26/2011  . Presence of right artificial  hip joint   . Protein-calorie malnutrition, severe ([place]) 12/10/2012  . S/P ORIF (open reduction internal fixation) fracture 01/08/2016  . Schatzki's ring 01/14/2015  . Scoliosis   . Substance abuse ([place])    Benbzos and Narcotics, she does not get any of those medicines from [facility], we have empathetically told her that.  . Vertigo   . Vitamin B deficiency   . Vitamin D deficiency 08/25/2016  .  Weight loss    Past Surgical History:  Procedure Laterality Date  . ABDOMINAL HYSTERECTOMY    . CATARACT EXTRACTION    . CHOLECYSTECTOMY    . ESOPHAGOGASTRODUODENOSCOPY (EGD) WITH PROPOFOL N/A 01/13/2015   Procedure: ESOPHAGOGASTRODUODENOSCOPY (EGD) WITH PROPOFOL;  Surgeon: [name], MD;  Location: [facility];  Service: Endoscopy;  Laterality: N/A;  . HIP ARTHROPLASTY Right 12/21/2015   Procedure: ARTHROPLASTY BIPOLAR HIP (HEMIARTHROPLASTY);  Surgeon: [name], MD;  Location: [facility];  Service: Orthopedics;  Laterality: Right;  . PARTIAL HYSTERECTOMY      Allergies  Allergen Reactions  . Amoxicillin     Per daughter  . Ampicillin     Per daughter  . Depakote [Valproic Acid]     DOES NOT WANT HER TO TAKE IT==Makes her hair fall out= per daughter who is poa  . Macrobid [Nitrofurantoin Macrocrystal] Rash    Per MAR  . Penicillins     Per daughter  . Acetaminophen Other (See Comments)    jaundice  . Azithromycin Other (See Comments)    Per MAR  . Butalbital   . Caffeine   . Doxycycline Other (See Comments)    Per MAR  . Escitalopram Oxalate Other (See Comments)    Per MAR  . Fioricet [Butalbital-Apap-Caffeine] Other (See Comments)    Drunk. Per MAR  . Latex Other (See Comments)    Per MAR  . Levofloxacin Other (See Comments)    Per MAR  . Levofloxacin   . Nsaids Other (See Comments)    This is not an allergy. The patient was told by Dr. Rockne Menghini to avoid NSAIDs and stop Vicoprofen in to 2014 to avoid nephrotoxicity.  Per MAR.  Marland Kitchen Oxycodone Other (See Comments)    reaction to synthetic codeine Per MAR  . Restasis [Cyclosporine] Other (See Comments)    Per MAR  . Sulfa Antibiotics Other (See Comments)    Per MAR  . Biaxin [Clarithromycin] Rash    With burning sensation Per [facility]    Outpatient Encounter Medications as of 08/06/2019  Medication Sig  . acetaminophen (TYLENOL) 325 MG tablet Take 650 mg by mouth every 6 (six) hours as needed.  Marland Kitchen  acetaminophen (TYLENOL) 500 MG tablet Give 2 tablets ( 1,000 mg total ) by mouth nightly for pain  . albuterol (PROVENTIL HFA;VENTOLIN HFA) 108 (90 BASE) MCG/ACT inhaler Inhale 1 puff into the lungs every 6 (six) hours as needed for shortness of breath.   Marland Kitchen aspirin 81 MG chewable tablet Chew 81 mg by mouth daily.  . Biotin 5000 MCG CAPS Take 1 capsule by mouth daily.   . bisacodyl (DULCOLAX) 10 MG suppository If not relieved by MOM, give 10 mg Bisacodyl suppositiory rectally X 1 dose in 24 hours as needed (Do not use constipation standing orders for residents with renal failure/CFR less than 30. Contact MD for orders) (Physician Order)  . cefTRIAXone (ROCEPHIN) IVPB Inject 1 g into the vein daily.  . Cholecalciferol (VITAMIN D3) 1.25 MG (50000 UT) CAPS Take 1 capsule by mouth once a week.  Marland Kitchen  citalopram (CELEXA) 10 MG tablet Take 10 mg by mouth daily.  . Cranberry 450 MG TABS Take 1 tablet by mouth 2 (two) times daily.   Marland Kitchen docusate sodium (COLACE) 100 MG capsule Take 100 mg by mouth 2 (two) times daily.  . dorzolamide-timolol (COSOPT) 22.3-6.8 MG/ML ophthalmic solution Place 1 drop into both eyes 2 (two) times daily.  Marland Kitchen ENSURE (ENSURE) Take 237 mLs by mouth 4 (four) times daily. D/t weight loss  . ferrous sulfate 325 (65 FE) MG tablet Take 325 mg by mouth daily.   . fludrocortisone (FLORINEF) 0.1 MG tablet Take 0.1 mg by mouth every morning. for orthostatic hypotension  . guaiFENesin (MUCINEX) 600 MG 12 hr tablet Take 600 mg by mouth 2 (two) times daily.  Marland Kitchen lactulose (CHRONULAC) 10 GM/15ML solution Take 15 mLs (10 g total) by mouth daily as needed for severe constipation.  Marland Kitchen latanoprost (XALATAN) 0.005 % ophthalmic solution Place 1 drop into both eyes at bedtime.  . lidocaine (LIDODERM) 5 % APPLY 1 PATCH TO ANT. LEFT THIGH DAILY LEAVE ON X 12 HOURS THEN REMOVE  . loratadine (CLARITIN) 10 MG tablet Take 10 mg by mouth daily.   . magnesium hydroxide (MILK OF MAGNESIA) 400 MG/5ML suspension If no BM  in 3 days, give 30 cc Milk of Magnesium p.o. x 1 dose in 24 hours as needed (Do not use standing constipation orders for residents with renal failure CFR less than 30. Contact MD for orders) (Physician Order)  . mirtazapine (REMERON) 15 MG tablet Take 7.5 mg by mouth at bedtime.  . nitroGLYCERIN (NITROSTAT) 0.4 MG SL tablet Place 0.4 mg under the tongue every 5 (five) minutes as needed for chest pain. May use 3 times  . NON FORMULARY Diet: Pureed Diet, Honey Thickened Liquids - to be given by spoon.  Vladimir Faster Glycol-Propyl Glycol (SYSTANE) 0.4-0.3 % SOLN Place 2 drops into both eyes every 2 (two) hours as needed (dry eyes).   . polyethylene glycol (MIRALAX / GLYCOLAX) packet Take 17 g by mouth daily.   . potassium chloride (KLOR-CON) 10 MEQ tablet Take 30 mEq by mouth daily. X 3 DAYS THEN REDUCE TO 20 MEQ DAILY  . [START ON 08/09/2019] potassium chloride (KLOR-CON) 10 MEQ tablet Take 20 mEq by mouth daily.  . QUEtiapine (SEROQUEL) 100 MG tablet Take 100 mg by mouth at bedtime. Take along with a 25 mg tablet to = 125 mg qhs  . QUEtiapine (SEROQUEL) 25 MG tablet Take 25 mg by mouth at bedtime. Take along with a 100 mg tablet to = 125 mg qhs  . senna (SENOKOT) 8.6 MG tablet Take 2 tablets by mouth 2 (two) times daily as needed for constipation.  . sodium chloride 0.45 % solution INFUSE 2 LITERS @@75ML /HR VIA MIDLINE FOR HYDRATION  . Sodium Phosphates (RA SALINE ENEMA RE) If not relieved by Biscodyl suppository, give disposable Saline Enema rectally X 1 dose/24 hrs as needed (Do not use constipation standing orders for residents with renal failure/CFR less than 30. Contact MD for orders)(Physician Or  . [DISCONTINUED] vitamin B-12 (CYANOCOBALAMIN) 1000 MCG tablet Take 1,000 mcg by mouth daily.    No facility-administered encounter medications on file as of 08/06/2019.    Review of Systems\  Unobtainable secondary to dementia-Per nursing she appears to be more at her baseline apparently did well this  morning with her p.o. intake-she is more responsive-somewhat combative which can be baseline for her  Immunization History  Administered Date(s) Administered  . Influenza Split 11/13/2011  .  Influenza-Unspecified 02/13/2008, 11/30/2013, 12/20/2016  . Moderna SARS-COVID-2 Vaccination 02/12/2019, 03/12/2019  . PPD Test 12/15/2015  . Pneumococcal Conjugate-13 10/16/2016  . Pneumococcal-Unspecified 02/13/2008  . Td 02/12/2009   Pertinent  Health Maintenance Due  Topic Date Due  . INFLUENZA VACCINE  09/13/2019  . DEXA SCAN  Completed  . PNA vac Low Risk Adult  Completed   Fall Risk  08/27/2017 08/22/2016  Falls in the past year? No Yes  Number falls in past yr: - 2 or more  Injury with Fall? - Yes  Comment - back of head   Functional Status Survey:    Vitals:   08/06/19 1528  BP: (!) 159/81  Pulse: 73  Resp: 17  Temp: 98.9 F (37.2 C)  TempSrc: Oral  SpO2: 94%  Weight: 79 lb 9.6 oz (36.1 kg)  Height: 4\' 11"  (1.499 m)  She remains afebrile-blood pressures systolically ranged from 500X to 150s on most recent readings-oxygen saturations have been in the 90s Body mass index is 16.08 kg/m. Physical Exam In general this is a frail elderly female who is more responsive than she was yesterday she is more alert makes eye contact is somewhat agitated at times-appears to be more at her baseline.  Her skin is warm and dry.  Eyes she is legally blind sclera and conjunctive are clear.  Oropharynx was difficult to assess since she did not really open her mouth very wide   Chest chest there is no labored breathing-air entry remains somewhat shallow but could not really appreciate overt congestion.  Abdomen is soft nontender with positive bowel sounds.  Musculoskeletal somewhat difficult to assess since patient cannot really follow verbal commands well-cannot appreciate any changes from baseline she has general frailty.-Does move her upper extremities at times when she is  agitated  Neurologic appears grossly intact cannot appreciate lateralizing findings.  Psych continues with significant dementia-is somewhat agitated at times during exam-which is more her baseline   Labs reviewed:  August 04, 2019.  Sodium 149 potassium 3.4 creatinine 39.1 creatinine 1.47.  Calcium 12.8.   Recent Labs    07/01/19 0000 07/15/19 0000 07/21/19 0000  NA 142 144 144  K 3.3* 3.5 3.5  CL 101 103 106  CO2 28* 27* 27*  BUN 25* 29* 29*  CREATININE 0.9 0.9 1.0  CALCIUM 11.1* 10.7 12.1*   Recent Labs    09/02/18 0000 03/27/19 0000 07/15/19 0000  AST 17 17 13   ALT 9 10 7   ALKPHOS 98 93 93  ALBUMIN  --  3.7 3.6   Recent Labs    11/22/18 0000 11/22/18 0000 01/28/19 0000 03/27/19 0000 07/15/19 0000  WBC 8.3   < > 6.1 6.4 5.9  NEUTROABS 6  --  4 3  --   HGB 12.3   < > 12.0 13.1 10.7*  HCT 37   < > 37 39 32*  PLT 234   < > 227 218 257   < > = values in this interval not displayed.   Lab Results  Component Value Date   TSH 0.59 07/31/2017   Lab Results  Component Value Date   HGBA1C 5.4 06/11/2016   Lab Results  Component Value Date   CHOL 134 07/31/2017   HDL 54 06/11/2016   LDLCALC 61 07/31/2017   TRIG 102 07/31/2017    Significant Diagnostic Results in last 30 days:  No results found.  Assessment/Plan  .  #1 history of cough/lethargy  with elevated creatinine calcium and sodium.  She  has been started on Rocephin-she is also receiving IV half-normal saline at 75 cc an hour for 2 L.-She is on potassium supplementation  She appears to be responding well to this-- she is more alert and closer to  her baseline today continues to have some cough but clinically appears to be improving.  At this point will continue current therapy including the IV fluids as well as Rocephin and await updated labs.  Continue to monitor clinically.  She has been started on oxygen at times  [id number]

## 2019-08-07 ENCOUNTER — Non-Acute Institutional Stay (SKILLED_NURSING_FACILITY): Payer: Medicare Other | Admitting: Family

## 2019-08-07 ENCOUNTER — Encounter: Payer: Self-pay | Admitting: Family

## 2019-08-07 DIAGNOSIS — N183 Chronic kidney disease, stage 3 unspecified: Secondary | ICD-10-CM

## 2019-08-07 DIAGNOSIS — R627 Adult failure to thrive: Secondary | ICD-10-CM | POA: Diagnosis not present

## 2019-08-07 DIAGNOSIS — E876 Hypokalemia: Secondary | ICD-10-CM | POA: Diagnosis not present

## 2019-08-07 LAB — CBC: RBC: 3.37 — AB (ref 3.87–5.11)

## 2019-08-07 LAB — CBC AND DIFFERENTIAL
Hemoglobin: 9.8 — AB (ref 12.0–16.0)
Platelets: 228 (ref 150–399)
WBC: 5.7

## 2019-08-07 LAB — BASIC METABOLIC PANEL
BUN: 40 — AB (ref 4–21)
CO2: 24 — AB (ref 13–22)
Chloride: 109 — AB (ref 99–108)
Creatinine: 1.2 — AB (ref 0.5–1.1)
Glucose: 95
Potassium: 2.8 — AB (ref 3.4–5.3)
Sodium: 146 (ref 137–147)

## 2019-08-07 LAB — COMPREHENSIVE METABOLIC PANEL
Calcium: 11.8 — AB (ref 8.7–10.7)
GFR calc Af Amer: 48.1
GFR calc non Af Amer: 41.5

## 2019-08-07 NOTE — Progress Notes (Signed)
Location:  [facility]    Location:  [place] of Service:  SNF (31) Provider:   Gayland Curry, DO  Patient Care Team: Gayland Curry, DO as PCP - General (Geriatric Medicine) [name], MD (Gastroenterology) [name], NP (Obstetrics and Gynecology) Rehab, [place] ([place])  Extended Emergency Contact Information Primary Emergency Contact: [name],[name] Address: [place], [place] Phone: [phone] Mobile Phone: [phone] Relation: Daughter Secondary Emergency Contact: [place] Phone: [phone] Mobile Phone: [phone] Relation: Daughter  Code Status: DNR Goals of care: Advanced Directive information Advanced Directives 08/07/2019  Does Patient Have a Medical Advance Directive? Yes  Type of Advance Directive Out of facility DNR (pink MOST or yellow form)  Does patient want to make changes to medical advance directive? No - Patient declined  Copy of [place] in Chart? -  Would patient like information on creating a medical advance directive? -  Pre-existing out of facility DNR order (yellow form or pink MOST form) -     Chief Complaint  Patient presents with  . Acute Visit    Abnormal Labs patassium is 2.8 / Bun 40    HPI:  Pt is a 86 y.o. female seen today for an acute visit for abnormal labs.I'm seeing her for the first time usually follows up with PCP Granville Lewis, PA Her potassium today was 2.8,BUN 40 and CR at baseline.Facility Nurse reports decreased oral intake.she was yesterday 08/06/2019 by Granville Lewis, PA for increased lethargy.she status post 7 day course of rocephin and IVF x 2 Liters with much improvement in her condition.Her vital signs have been stable.she gets agitated but at baseline per staff.     Past Medical History:  Diagnosis Date  .  Anxiety   . Arterial tortuosity (aorta) 12/11/2012  . Asthma   . Chronic kidney disease, stage IV (severe) ([place]) 12/11/2012  . Chronic mental illness   . CKD (chronic kidney disease), stage III 01/14/2015  . Closed hip fracture requiring operative repair, right, with routine healing, subsequent encounter 12/19/2015  . Colitis   . COPD (chronic obstructive pulmonary disease) ([place])   . Decreased rectal sphincter tone 11/21/2012  . Depression   . Drug-seeking behavior 02/26/2011   Use of multiple md to obtain benzos/narcotics pt must sign contract to receive controlled meds.   Marland Kitchen Dysphagia 01/11/2015  . GERD (gastroesophageal reflux disease)   . Glaucoma 06/26/2011  . Glaucoma of both eyes   . Hearing loss, sensorineural 03/04/2014   [facility]; 02/26/14. There is not a medical or surgical treatment that would benefit her type of hearing loss. We discussed amplification in an overview fashion.    Marland Kitchen History of fall   . Insomnia, unspecified   . Macular degeneration 06/26/2011  . Migraines   . Nasal bone fracture 03/30/2014  . Osteoporosis   . Osteoporosis 02/26/2011  . Presence of right artificial hip joint   . Protein-calorie malnutrition, severe ([place]) 12/10/2012  . S/P ORIF (open reduction internal fixation) fracture 01/08/2016  . Schatzki's ring 01/14/2015  . Scoliosis   . Substance abuse ([place])    Benbzos and Narcotics, she does not get any of those medicines from [name], we have empathetically told her that.  . Vertigo   . Vitamin B deficiency   . Vitamin D  deficiency 08/25/2016  . Weight loss    Past Surgical History:  Procedure Laterality Date  . ABDOMINAL HYSTERECTOMY    . CATARACT EXTRACTION    . CHOLECYSTECTOMY    . ESOPHAGOGASTRODUODENOSCOPY (EGD) WITH PROPOFOL N/A 01/13/2015   Procedure: ESOPHAGOGASTRODUODENOSCOPY (EGD) WITH PROPOFOL;  Surgeon: [name], MD;  Location: [facility];  Service: Endoscopy;  Laterality: N/A;  . HIP ARTHROPLASTY Right 12/21/2015   Procedure:  ARTHROPLASTY BIPOLAR HIP (HEMIARTHROPLASTY);  Surgeon: [name], MD;  Location: [place];  Service: Orthopedics;  Laterality: Right;  . PARTIAL HYSTERECTOMY      Allergies  Allergen Reactions  . Amoxicillin     Per daughter  . Ampicillin     Per daughter  . Depakote [Valproic Acid]     DOES NOT WANT HER TO TAKE IT==Makes her hair fall out= per daughter who is poa  . Macrobid [Nitrofurantoin Macrocrystal] Rash    Per MAR  . Penicillins     Per daughter  . Acetaminophen Other (See Comments)    jaundice  . Azithromycin Other (See Comments)    Per MAR  . Butalbital   . Caffeine   . Doxycycline Other (See Comments)    Per MAR  . Escitalopram Oxalate Other (See Comments)    Per MAR  . Fioricet [Butalbital-Apap-Caffeine] Other (See Comments)    Drunk. Per MAR  . Latex Other (See Comments)    Per MAR  . Levofloxacin Other (See Comments)    Per MAR  . Levofloxacin   . Nsaids Other (See Comments)    This is not an allergy. The patient was told by Dr. Rockne Menghini to avoid NSAIDs and stop Vicoprofen in to 2014 to avoid nephrotoxicity.  Per MAR.  Marland Kitchen Oxycodone Other (See Comments)    reaction to synthetic codeine Per MAR  . Restasis [Cyclosporine] Other (See Comments)    Per MAR  . Sulfa Antibiotics Other (See Comments)    Per MAR  . Biaxin [Clarithromycin] Rash    With burning sensation Per [facility]    Outpatient Encounter Medications as of 08/07/2019  Medication Sig  . acetaminophen (TYLENOL) 500 MG tablet Give 2 tablets ( 1,000 mg total ) by mouth nightly for pain  . albuterol (PROVENTIL HFA;VENTOLIN HFA) 108 (90 BASE) MCG/ACT inhaler Inhale 1 puff into the lungs every 6 (six) hours as needed for shortness of breath.   Marland Kitchen aspirin 81 MG chewable tablet Chew 81 mg by mouth daily.  . Biotin 5000 MCG CAPS Take 1 capsule by mouth daily.   . cefTRIAXone (ROCEPHIN) IVPB Inject 1 g into the vein daily.  . Cholecalciferol (VITAMIN D3) 1.25 MG (50000 UT) CAPS Take 1 capsule by mouth  once a week.  . citalopram (CELEXA) 10 MG tablet Take 10 mg by mouth daily.  . Cranberry 450 MG TABS Take 1 tablet by mouth 2 (two) times daily.   Marland Kitchen docusate sodium (COLACE) 100 MG capsule Take 100 mg by mouth 2 (two) times daily.  . dorzolamide-timolol (COSOPT) 22.3-6.8 MG/ML ophthalmic solution Place 1 drop into both eyes 2 (two) times daily.  Marland Kitchen ENSURE (ENSURE) Take 237 mLs by mouth 4 (four) times daily. D/t weight loss  . ferrous sulfate 325 (65 FE) MG tablet Take 325 mg by mouth daily.   . fludrocortisone (FLORINEF) 0.1 MG tablet Take 0.1 mg by mouth every morning. for orthostatic hypotension  . guaiFENesin (MUCINEX) 600 MG 12 hr tablet Take 600 mg by mouth 2 (two) times daily.  Marland Kitchen lactulose (  CHRONULAC) 10 GM/15ML solution Take 15 mLs (10 g total) by mouth daily as needed for severe constipation.  Marland Kitchen latanoprost (XALATAN) 0.005 % ophthalmic solution Place 1 drop into both eyes at bedtime.  . lidocaine (LIDODERM) 5 % APPLY 1 PATCH TO ANT. LEFT THIGH DAILY LEAVE ON X 12 HOURS THEN REMOVE  . loratadine (CLARITIN) 10 MG tablet Take 10 mg by mouth daily.   . magnesium hydroxide (MILK OF MAGNESIA) 400 MG/5ML suspension If no BM in 3 days, give 30 cc Milk of Magnesium p.o. x 1 dose in 24 hours as needed (Do not use standing constipation orders for residents with renal failure CFR less than 30. Contact MD for orders) (Physician Order)  . mirtazapine (REMERON) 15 MG tablet Take 7.5 mg by mouth at bedtime.  . nitroGLYCERIN (NITROSTAT) 0.4 MG SL tablet Place 0.4 mg under the tongue every 5 (five) minutes as needed for chest pain. May use 3 times  . NON FORMULARY Diet: Pureed Diet, Honey Thickened Liquids - to be given by spoon.  Vladimir Faster Glycol-Propyl Glycol (SYSTANE) 0.4-0.3 % SOLN Place 2 drops into both eyes every 2 (two) hours as needed (dry eyes).   . polyethylene glycol (MIRALAX / GLYCOLAX) packet Take 17 g by mouth daily.   . potassium chloride (KLOR-CON) 10 MEQ tablet Take 30 mEq by mouth daily.  X 3 DAYS THEN REDUCE TO 20 MEQ DAILY  . [START ON 08/09/2019] potassium chloride (KLOR-CON) 10 MEQ tablet Take 20 mEq by mouth daily.  . QUEtiapine (SEROQUEL) 100 MG tablet Take 100 mg by mouth at bedtime. Take along with a 25 mg tablet to = 125 mg qhs  . QUEtiapine (SEROQUEL) 25 MG tablet Take 25 mg by mouth at bedtime. Take along with a 100 mg tablet to = 125 mg qhs  . senna (SENOKOT) 8.6 MG tablet Take 2 tablets by mouth 2 (two) times daily as needed for constipation.  . sodium chloride 0.45 % solution INFUSE 2 LITERS @@75ML /HR VIA MIDLINE FOR HYDRATION  . Sodium Phosphates (RA SALINE ENEMA RE) If not relieved by Biscodyl suppository, give disposable Saline Enema rectally X 1 dose/24 hrs as needed (Do not use constipation standing orders for residents with renal failure/CFR less than 30. Contact MD for orders)(Physician Or  . [DISCONTINUED] acetaminophen (TYLENOL) 325 MG tablet Take 650 mg by mouth every 6 (six) hours as needed.  . [DISCONTINUED] bisacodyl (DULCOLAX) 10 MG suppository If not relieved by MOM, give 10 mg Bisacodyl suppositiory rectally X 1 dose in 24 hours as needed (Do not use constipation standing orders for residents with renal failure/CFR less than 30. Contact MD for orders) (Physician Order)   No facility-administered encounter medications on file as of 08/07/2019.    Review of Systems  Unable to perform ROS: Dementia (Information provided by faility Nurse )    Immunization History  Administered Date(s) Administered  . Influenza Split 11/13/2011  . Influenza-Unspecified 02/13/2008, 11/30/2013, 12/20/2016  . Moderna SARS-COVID-2 Vaccination 02/12/2019, 03/12/2019  . PPD Test 12/15/2015  . Pneumococcal Conjugate-13 10/16/2016  . Pneumococcal-Unspecified 02/13/2008  . Td 02/12/2009   Pertinent  Health Maintenance Due  Topic Date Due  . INFLUENZA VACCINE  09/13/2019  . DEXA SCAN  Completed  . PNA vac Low Risk Adult  Completed   Fall Risk  08/27/2017 08/22/2016  Falls  in the past year? No Yes  Number falls in past yr: - 2 or more  Injury with Fall? - Yes  Comment - back of head  Vitals:   08/07/19 1614  BP: (!) 142/75  Pulse: 69  Temp: (!) 97 F (36.1 C)  Weight: 79 lb 9.6 oz (36.1 kg)  Height: 4\' 11"  (1.499 m)   Body mass index is 16.08 kg/m. Physical Exam Vitals reviewed.  Constitutional:      General: She is not in acute distress.    Appearance: She is underweight. She is not ill-appearing.     Comments: Frail elderly   HENT:     Mouth/Throat:     Mouth: Mucous membranes are moist.  Eyes:     General: No scleral icterus.       Right eye: No discharge.        Left eye: No discharge.     Conjunctiva/sclera: Conjunctivae normal.     Comments: Legally blind   Cardiovascular:     Rate and Rhythm: Normal rate and regular rhythm.     Pulses: Normal pulses.     Heart sounds: Normal heart sounds. No murmur heard.  No friction rub. No gallop.   Pulmonary:     Effort: Pulmonary effort is normal. No respiratory distress.     Breath sounds: Normal breath sounds. No wheezing, rhonchi or rales.  Chest:     Chest wall: No tenderness.  Abdominal:     General: Abdomen is flat. Bowel sounds are normal. There is no distension.     Palpations: Abdomen is soft. There is no mass.     Tenderness: There is no abdominal tenderness. There is no guarding or rebound.  Musculoskeletal:        General: No swelling or tenderness.     Cervical back: No tenderness.     Right lower leg: No edema.     Left lower leg: No edema.     Comments: Lying in bed   Lymphadenopathy:     Cervical: No cervical adenopathy.  Skin:    General: Skin is warm and dry.     Coloration: Skin is not pale.     Findings: No bruising or rash.     Comments: Left hip foam dressing in place for skin breakdown  Protection.skin intact.   Neurological:     Mental Status: She is alert. Mental status is at baseline.     Gait: Gait abnormal.  Psychiatric:        Mood and Affect:  Mood normal.        Behavior: Behavior is agitated. Behavior is not aggressive or combative.     Comments: Easily agitated though at baseline    Labs reviewed: Recent Labs    07/01/19 0000 07/15/19 0000 07/21/19 0000  NA 142 144 144  K 3.3* 3.5 3.5  CL 101 103 106  CO2 28* 27* 27*  BUN 25* 29* 29*  CREATININE 0.9 0.9 1.0  CALCIUM 11.1* 10.7 12.1*   Recent Labs    09/02/18 0000 03/27/19 0000 07/15/19 0000  AST 17 17 13   ALT 9 10 7   ALKPHOS 98 93 93  ALBUMIN  --  3.7 3.6   Recent Labs    11/22/18 0000 11/22/18 0000 01/28/19 0000 03/27/19 0000 07/15/19 0000  WBC 8.3   < > 6.1 6.4 5.9  NEUTROABS 6  --  4 3  --   HGB 12.3   < > 12.0 13.1 10.7*  HCT 37   < > 37 39 32*  PLT 234   < > 227 218 257   < > = values in this interval not displayed.  Lab Results  Component Value Date   TSH 0.59 07/31/2017   Lab Results  Component Value Date   HGBA1C 5.4 06/11/2016   Lab Results  Component Value Date   CHOL 134 07/31/2017   HDL 54 06/11/2016   LDLCALC 61 07/31/2017   TRIG 102 07/31/2017    Significant Diagnostic Results in last 30 days:  No results found.  Assessment/Plan 1. Hypokalemia K+ 28  - Potassium chloride 40 meq Tablet one by mouth x 1 dose.  - Recheck BMP 08/12/2019   2. Stage 3 chronic kidney disease, unspecified whether stage 3a or 3b CKD CR at baseline.BUN 40 status post completion of  IVF 1/2 NS x Liter.Will order one more liter IV 1/2 NS @ 75 cc/hr.  - continue to avoid nephrotoxins and dose other medication for renal clearance. Recheck BMP as above.  - continue to encourage fluid.   3. Adult failure to thrive Frail but weight is stable.Progressive decline expected given her medical history of dementia. DNR in place.  continue on Remeron 7.5 mg tablet at bedtime. continue to encourage oral intake.   Family/ staff Communication: Reviewed plan of care with facility Nurse.   Labs/tests ordered: Recheck BMP 08/12/2019

## 2019-08-08 ENCOUNTER — Encounter: Payer: Self-pay | Admitting: Internal Medicine

## 2019-08-12 ENCOUNTER — Inpatient Hospital Stay (HOSPITAL_COMMUNITY)
Admission: EM | Admit: 2019-08-12 | Discharge: 2019-08-18 | DRG: 682 | Disposition: A | Discharge status: Skilled Nursing Facility | Payer: Medicare Other | Source: Skilled Nursing Facility | Attending: Family Medicine | Admitting: Family Medicine

## 2019-08-12 ENCOUNTER — Other Ambulatory Visit: Payer: Self-pay

## 2019-08-12 ENCOUNTER — Encounter (HOSPITAL_COMMUNITY): Payer: Self-pay | Admitting: Emergency Medicine

## 2019-08-12 DIAGNOSIS — Z880 Allergy status to penicillin: Secondary | ICD-10-CM

## 2019-08-12 DIAGNOSIS — F319 Bipolar disorder, unspecified: Secondary | ICD-10-CM | POA: Diagnosis present

## 2019-08-12 DIAGNOSIS — N179 Acute kidney failure, unspecified: Principal | ICD-10-CM | POA: Diagnosis present

## 2019-08-12 DIAGNOSIS — Z823 Family history of stroke: Secondary | ICD-10-CM

## 2019-08-12 DIAGNOSIS — J449 Chronic obstructive pulmonary disease, unspecified: Secondary | ICD-10-CM | POA: Diagnosis present

## 2019-08-12 DIAGNOSIS — R627 Adult failure to thrive: Secondary | ICD-10-CM | POA: Diagnosis present

## 2019-08-12 DIAGNOSIS — Z515 Encounter for palliative care: Secondary | ICD-10-CM

## 2019-08-12 DIAGNOSIS — E861 Hypovolemia: Secondary | ICD-10-CM | POA: Diagnosis present

## 2019-08-12 DIAGNOSIS — E43 Unspecified severe protein-calorie malnutrition: Secondary | ICD-10-CM | POA: Diagnosis present

## 2019-08-12 DIAGNOSIS — H3530 Unspecified macular degeneration: Secondary | ICD-10-CM | POA: Diagnosis present

## 2019-08-12 DIAGNOSIS — J189 Pneumonia, unspecified organism: Secondary | ICD-10-CM

## 2019-08-12 DIAGNOSIS — Z66 Do not resuscitate: Secondary | ICD-10-CM | POA: Diagnosis present

## 2019-08-12 DIAGNOSIS — F0390 Unspecified dementia without behavioral disturbance: Secondary | ICD-10-CM | POA: Diagnosis present

## 2019-08-12 DIAGNOSIS — J690 Pneumonitis due to inhalation of food and vomit: Secondary | ICD-10-CM | POA: Diagnosis present

## 2019-08-12 DIAGNOSIS — Z7982 Long term (current) use of aspirin: Secondary | ICD-10-CM

## 2019-08-12 DIAGNOSIS — Z20822 Contact with and (suspected) exposure to covid-19: Secondary | ICD-10-CM | POA: Diagnosis present

## 2019-08-12 DIAGNOSIS — Z79899 Other long term (current) drug therapy: Secondary | ICD-10-CM

## 2019-08-12 DIAGNOSIS — I129 Hypertensive chronic kidney disease with stage 1 through stage 4 chronic kidney disease, or unspecified chronic kidney disease: Secondary | ICD-10-CM | POA: Diagnosis present

## 2019-08-12 DIAGNOSIS — E870 Hyperosmolality and hypernatremia: Secondary | ICD-10-CM | POA: Diagnosis present

## 2019-08-12 DIAGNOSIS — G8929 Other chronic pain: Secondary | ICD-10-CM | POA: Diagnosis present

## 2019-08-12 DIAGNOSIS — Z681 Body mass index (BMI) 19 or less, adult: Secondary | ICD-10-CM

## 2019-08-12 DIAGNOSIS — E876 Hypokalemia: Secondary | ICD-10-CM | POA: Diagnosis present

## 2019-08-12 DIAGNOSIS — F03C0 Unspecified dementia, severe, without behavioral disturbance, psychotic disturbance, mood disturbance, and anxiety: Secondary | ICD-10-CM | POA: Diagnosis present

## 2019-08-12 DIAGNOSIS — E860 Dehydration: Secondary | ICD-10-CM | POA: Diagnosis present

## 2019-08-12 DIAGNOSIS — H547 Unspecified visual loss: Secondary | ICD-10-CM | POA: Diagnosis present

## 2019-08-12 DIAGNOSIS — Z833 Family history of diabetes mellitus: Secondary | ICD-10-CM

## 2019-08-12 DIAGNOSIS — R52 Pain, unspecified: Secondary | ICD-10-CM | POA: Diagnosis present

## 2019-08-12 DIAGNOSIS — Z7189 Other specified counseling: Secondary | ICD-10-CM

## 2019-08-12 DIAGNOSIS — Z8249 Family history of ischemic heart disease and other diseases of the circulatory system: Secondary | ICD-10-CM

## 2019-08-12 DIAGNOSIS — Z96641 Presence of right artificial hip joint: Secondary | ICD-10-CM | POA: Diagnosis present

## 2019-08-12 DIAGNOSIS — I951 Orthostatic hypotension: Secondary | ICD-10-CM | POA: Diagnosis present

## 2019-08-12 DIAGNOSIS — I10 Essential (primary) hypertension: Secondary | ICD-10-CM | POA: Diagnosis present

## 2019-08-12 DIAGNOSIS — M810 Age-related osteoporosis without current pathological fracture: Secondary | ICD-10-CM | POA: Diagnosis present

## 2019-08-12 DIAGNOSIS — N1831 Chronic kidney disease, stage 3a: Secondary | ICD-10-CM

## 2019-08-12 LAB — MAGNESIUM: Magnesium: 2.5 mg/dL — ABNORMAL HIGH (ref 1.7–2.4)

## 2019-08-12 LAB — CBC WITH DIFFERENTIAL/PLATELET
Abs Immature Granulocytes: 0.04 10*3/uL (ref 0.00–0.07)
Basophils Absolute: 0 10*3/uL (ref 0.0–0.1)
Basophils Relative: 0 %
Eosinophils Absolute: 0.1 10*3/uL (ref 0.0–0.5)
Eosinophils Relative: 2 %
HCT: 37.2 % (ref 36.0–46.0)
Hemoglobin: 11.5 g/dL — ABNORMAL LOW (ref 12.0–15.0)
Immature Granulocytes: 0 %
Lymphocytes Relative: 22 %
Lymphs Abs: 2.1 10*3/uL (ref 0.7–4.0)
MCH: 28.8 pg (ref 26.0–34.0)
MCHC: 30.9 g/dL (ref 30.0–36.0)
MCV: 93 fL (ref 80.0–100.0)
Monocytes Absolute: 0.6 10*3/uL (ref 0.1–1.0)
Monocytes Relative: 7 %
Neutro Abs: 6.6 10*3/uL (ref 1.7–7.7)
Neutrophils Relative %: 69 %
Platelets: 386 10*3/uL (ref 150–400)
RBC: 4 MIL/uL (ref 3.87–5.11)
RDW: 14.3 % (ref 11.5–15.5)
WBC: 9.6 10*3/uL (ref 4.0–10.5)
nRBC: 0 % (ref 0.0–0.2)

## 2019-08-12 LAB — COMPREHENSIVE METABOLIC PANEL
ALT: 15 U/L (ref 0–44)
AST: 17 U/L (ref 15–41)
Albumin: 3.7 g/dL (ref 3.5–5.0)
Alkaline Phosphatase: 83 U/L (ref 38–126)
Anion gap: 16 — ABNORMAL HIGH (ref 5–15)
BUN: 45 mg/dL — ABNORMAL HIGH (ref 8–23)
CO2: 24 mmol/L (ref 22–32)
Calcium: 12.1 mg/dL — ABNORMAL HIGH (ref 8.9–10.3)
Calcium: 13.2 — AB (ref 8.7–10.7)
Chloride: 112 mmol/L — ABNORMAL HIGH (ref 98–111)
Creatinine, Ser: 1.7 mg/dL — ABNORMAL HIGH (ref 0.44–1.00)
GFR calc Af Amer: 31 mL/min — ABNORMAL LOW (ref >60)
GFR calc Af Amer: 34.5
GFR calc non Af Amer: 26 mL/min — ABNORMAL LOW (ref >60)
GFR calc non Af Amer: 29.77
Glucose, Bld: 102 mg/dL — ABNORMAL HIGH (ref 70–99)
Potassium: 3.5 mmol/L (ref 3.5–5.1)
Sodium: 152 mmol/L — ABNORMAL HIGH (ref 135–145)
Total Bilirubin: 0.3 mg/dL (ref 0.3–1.2)
Total Protein: 7.5 g/dL (ref 6.5–8.1)

## 2019-08-12 LAB — BASIC METABOLIC PANEL
BUN: 38 — AB (ref 4–21)
CO2: 27 — AB (ref 13–22)
Chloride: 112 — AB (ref 99–108)
Creatinine: 1.5 — AB (ref 0.5–1.1)
Glucose: 74
Potassium: 3.7 (ref 3.4–5.3)
Sodium: 153 — AB (ref 137–147)

## 2019-08-12 LAB — PHOSPHORUS: Phosphorus: 3.3 mg/dL (ref 2.5–4.6)

## 2019-08-12 NOTE — ED Notes (Signed)
Taylor Roth would like to be called at home with updates.

## 2019-08-12 NOTE — ED Triage Notes (Signed)
Patient presents with abnormal lab result. Lab value: Calcium of 13.2 Patient is currently cognitively at baseline.

## 2019-08-13 ENCOUNTER — Encounter (HOSPITAL_COMMUNITY): Payer: Self-pay | Admitting: Family Medicine

## 2019-08-13 DIAGNOSIS — F0390 Unspecified dementia without behavioral disturbance: Secondary | ICD-10-CM

## 2019-08-13 DIAGNOSIS — H547 Unspecified visual loss: Secondary | ICD-10-CM | POA: Diagnosis present

## 2019-08-13 DIAGNOSIS — J449 Chronic obstructive pulmonary disease, unspecified: Secondary | ICD-10-CM | POA: Diagnosis present

## 2019-08-13 DIAGNOSIS — N1831 Chronic kidney disease, stage 3a: Secondary | ICD-10-CM | POA: Diagnosis present

## 2019-08-13 DIAGNOSIS — J690 Pneumonitis due to inhalation of food and vomit: Secondary | ICD-10-CM | POA: Diagnosis present

## 2019-08-13 DIAGNOSIS — N171 Acute kidney failure with acute cortical necrosis: Secondary | ICD-10-CM

## 2019-08-13 DIAGNOSIS — Z20822 Contact with and (suspected) exposure to covid-19: Secondary | ICD-10-CM | POA: Diagnosis present

## 2019-08-13 DIAGNOSIS — R52 Pain, unspecified: Secondary | ICD-10-CM | POA: Diagnosis not present

## 2019-08-13 DIAGNOSIS — E861 Hypovolemia: Secondary | ICD-10-CM | POA: Diagnosis present

## 2019-08-13 DIAGNOSIS — E876 Hypokalemia: Secondary | ICD-10-CM | POA: Diagnosis present

## 2019-08-13 DIAGNOSIS — E43 Unspecified severe protein-calorie malnutrition: Secondary | ICD-10-CM | POA: Diagnosis present

## 2019-08-13 DIAGNOSIS — I951 Orthostatic hypotension: Secondary | ICD-10-CM | POA: Diagnosis present

## 2019-08-13 DIAGNOSIS — Z880 Allergy status to penicillin: Secondary | ICD-10-CM | POA: Diagnosis not present

## 2019-08-13 DIAGNOSIS — E860 Dehydration: Secondary | ICD-10-CM | POA: Diagnosis present

## 2019-08-13 DIAGNOSIS — E870 Hyperosmolality and hypernatremia: Secondary | ICD-10-CM | POA: Diagnosis present

## 2019-08-13 DIAGNOSIS — M810 Age-related osteoporosis without current pathological fracture: Secondary | ICD-10-CM | POA: Diagnosis present

## 2019-08-13 DIAGNOSIS — F319 Bipolar disorder, unspecified: Secondary | ICD-10-CM | POA: Diagnosis present

## 2019-08-13 DIAGNOSIS — Z66 Do not resuscitate: Secondary | ICD-10-CM | POA: Diagnosis present

## 2019-08-13 DIAGNOSIS — Z515 Encounter for palliative care: Secondary | ICD-10-CM | POA: Diagnosis not present

## 2019-08-13 DIAGNOSIS — N179 Acute kidney failure, unspecified: Secondary | ICD-10-CM | POA: Diagnosis present

## 2019-08-13 DIAGNOSIS — N170 Acute kidney failure with tubular necrosis: Secondary | ICD-10-CM | POA: Diagnosis not present

## 2019-08-13 DIAGNOSIS — Z7982 Long term (current) use of aspirin: Secondary | ICD-10-CM | POA: Diagnosis not present

## 2019-08-13 DIAGNOSIS — Z7189 Other specified counseling: Secondary | ICD-10-CM | POA: Diagnosis not present

## 2019-08-13 DIAGNOSIS — Z681 Body mass index (BMI) 19 or less, adult: Secondary | ICD-10-CM | POA: Diagnosis not present

## 2019-08-13 DIAGNOSIS — I10 Essential (primary) hypertension: Secondary | ICD-10-CM

## 2019-08-13 DIAGNOSIS — I129 Hypertensive chronic kidney disease with stage 1 through stage 4 chronic kidney disease, or unspecified chronic kidney disease: Secondary | ICD-10-CM | POA: Diagnosis present

## 2019-08-13 DIAGNOSIS — G8929 Other chronic pain: Secondary | ICD-10-CM | POA: Diagnosis present

## 2019-08-13 DIAGNOSIS — R627 Adult failure to thrive: Secondary | ICD-10-CM | POA: Diagnosis present

## 2019-08-13 DIAGNOSIS — H3530 Unspecified macular degeneration: Secondary | ICD-10-CM | POA: Diagnosis present

## 2019-08-13 LAB — COMPREHENSIVE METABOLIC PANEL
ALT: 16 U/L (ref 0–44)
AST: 20 U/L (ref 15–41)
Albumin: 3.8 g/dL (ref 3.5–5.0)
Alkaline Phosphatase: 80 U/L (ref 38–126)
Anion gap: 12 (ref 5–15)
BUN: 41 mg/dL — ABNORMAL HIGH (ref 8–23)
CO2: 25 mmol/L (ref 22–32)
Calcium: 11.3 mg/dL — ABNORMAL HIGH (ref 8.9–10.3)
Chloride: 114 mmol/L — ABNORMAL HIGH (ref 98–111)
Creatinine, Ser: 1.49 mg/dL — ABNORMAL HIGH (ref 0.44–1.00)
GFR calc Af Amer: 36 mL/min — ABNORMAL LOW (ref >60)
GFR calc non Af Amer: 31 mL/min — ABNORMAL LOW (ref >60)
Glucose, Bld: 109 mg/dL — ABNORMAL HIGH (ref 70–99)
Potassium: 3.6 mmol/L (ref 3.5–5.1)
Sodium: 151 mmol/L — ABNORMAL HIGH (ref 135–145)
Total Bilirubin: 0.4 mg/dL (ref 0.3–1.2)
Total Protein: 7.7 g/dL (ref 6.5–8.1)

## 2019-08-13 LAB — URINALYSIS, COMPLETE (UACMP) WITH MICROSCOPIC
Bacteria, UA: NONE SEEN
Bilirubin Urine: NEGATIVE
Glucose, UA: NEGATIVE mg/dL
Hgb urine dipstick: NEGATIVE
Ketones, ur: NEGATIVE mg/dL
Nitrite: NEGATIVE
Protein, ur: NEGATIVE mg/dL
Specific Gravity, Urine: 1.015 (ref 1.005–1.030)
pH: 6 (ref 5.0–8.0)

## 2019-08-13 LAB — CBC
HCT: 35.9 % — ABNORMAL LOW (ref 36.0–46.0)
Hemoglobin: 11.4 g/dL — ABNORMAL LOW (ref 12.0–15.0)
MCH: 29.2 pg (ref 26.0–34.0)
MCHC: 31.8 g/dL (ref 30.0–36.0)
MCV: 91.8 fL (ref 80.0–100.0)
Platelets: 386 10*3/uL (ref 150–400)
RBC: 3.91 MIL/uL (ref 3.87–5.11)
RDW: 14.6 % (ref 11.5–15.5)
WBC: 8.6 10*3/uL (ref 4.0–10.5)
nRBC: 0 % (ref 0.0–0.2)

## 2019-08-13 LAB — SARS CORONAVIRUS 2 BY RT PCR (HOSPITAL ORDER, PERFORMED IN ~~LOC~~ HOSPITAL LAB): SARS Coronavirus 2: NEGATIVE

## 2019-08-13 LAB — SODIUM, URINE, RANDOM: Sodium, Ur: 66 mmol/L

## 2019-08-13 LAB — CREATININE, URINE, RANDOM: Creatinine, Urine: 66.82 mg/dL

## 2019-08-13 MED ORDER — CHLORHEXIDINE GLUCONATE CLOTH 2 % EX PADS: 6.0000 | MEDICATED_PAD | Freq: Every day | CUTANEOUS | Status: DC

## 2019-08-13 MED ORDER — ALBUTEROL SULFATE HFA 108 (90 BASE) MCG/ACT IN AERS: 1.0000 | INHALATION_SPRAY | Freq: Four times a day (QID) | RESPIRATORY_TRACT | Status: DC | PRN

## 2019-08-13 MED ORDER — CITALOPRAM HYDROBROMIDE 20 MG PO TABS
10.0000 mg | ORAL_TABLET | Freq: Every day | ORAL | Status: DC
  Administered 2019-08-13 – 2019-08-18 (×6): 10 mg via ORAL
  Filled 2019-08-13 (×7): qty 1

## 2019-08-13 MED ORDER — LIDOCAINE 5 % EX PTCH
1.0000 | MEDICATED_PATCH | CUTANEOUS | Status: DC
  Administered 2019-08-13 – 2019-08-18 (×6): 1 via TRANSDERMAL
  Filled 2019-08-13 (×6): qty 1

## 2019-08-13 MED ORDER — SODIUM CHLORIDE 0.9 % IV BOLUS
500.0000 mL | Freq: Once | INTRAVENOUS | Status: AC
  Administered 2019-08-13: 500 mL via INTRAVENOUS

## 2019-08-13 MED ORDER — LABETALOL HCL 5 MG/ML IV SOLN
10.0000 mg | INTRAVENOUS | Status: DC | PRN
  Administered 2019-08-13 (×2): 10 mg via INTRAVENOUS
  Filled 2019-08-13 (×2): qty 4

## 2019-08-13 MED ORDER — ONDANSETRON HCL 4 MG/2ML IJ SOLN: 4.0000 mg | Freq: Four times a day (QID) | INTRAMUSCULAR | Status: DC | PRN

## 2019-08-13 MED ORDER — QUETIAPINE FUMARATE 25 MG PO TABS
125.0000 mg | ORAL_TABLET | Freq: Every day | ORAL | Status: DC
  Administered 2019-08-13: 125 mg via ORAL
  Filled 2019-08-13: qty 1

## 2019-08-13 MED ORDER — SENNOSIDES-DOCUSATE SODIUM 8.6-50 MG PO TABS: 1.0000 | ORAL_TABLET | Freq: Every evening | ORAL | Status: DC | PRN

## 2019-08-13 MED ORDER — SODIUM CHLORIDE 0.9% FLUSH
10.0000 mL | INTRAVENOUS | Status: DC | PRN
  Administered 2019-08-14: 10 mL

## 2019-08-13 MED ORDER — SODIUM CHLORIDE 0.45 % IV SOLN: INTRAVENOUS | Status: AC

## 2019-08-13 MED ORDER — SODIUM CHLORIDE 0.9% FLUSH
3.0000 mL | Freq: Two times a day (BID) | INTRAVENOUS | Status: DC
  Administered 2019-08-13 – 2019-08-17 (×4): 3 mL via INTRAVENOUS

## 2019-08-13 MED ORDER — RESOURCE THICKENUP CLEAR PO POWD
ORAL | Status: DC | PRN
  Filled 2019-08-13: qty 125

## 2019-08-13 MED ORDER — SODIUM CHLORIDE 0.9% FLUSH
10.0000 mL | Freq: Two times a day (BID) | INTRAVENOUS | Status: DC
  Administered 2019-08-15 – 2019-08-17 (×3): 10 mL

## 2019-08-13 MED ORDER — LATANOPROST 0.005 % OP SOLN
1.0000 [drp] | Freq: Every day | OPHTHALMIC | Status: DC
  Administered 2019-08-13 – 2019-08-16 (×4): 1 [drp] via OPHTHALMIC
  Filled 2019-08-13: qty 2.5

## 2019-08-13 MED ORDER — QUETIAPINE FUMARATE 25 MG PO TABS: 25.0000 mg | ORAL_TABLET | Freq: Every day | ORAL | Status: DC

## 2019-08-13 MED ORDER — ACETAMINOPHEN 650 MG RE SUPP: 650.0000 mg | Freq: Four times a day (QID) | RECTAL | Status: DC | PRN

## 2019-08-13 MED ORDER — ALBUTEROL SULFATE (2.5 MG/3ML) 0.083% IN NEBU: 2.5000 mg | INHALATION_SOLUTION | Freq: Four times a day (QID) | RESPIRATORY_TRACT | Status: DC | PRN

## 2019-08-13 MED ORDER — ACETAMINOPHEN 325 MG PO TABS: 650.0000 mg | ORAL_TABLET | Freq: Four times a day (QID) | ORAL | Status: DC | PRN

## 2019-08-13 MED ORDER — LACTATED RINGERS IV SOLN: INTRAVENOUS | Status: DC

## 2019-08-13 MED ORDER — MIRTAZAPINE 15 MG PO TABS
7.5000 mg | ORAL_TABLET | Freq: Every day | ORAL | Status: DC
  Administered 2019-08-13: 7.5 mg via ORAL
  Filled 2019-08-13: qty 1

## 2019-08-13 MED ORDER — HEPARIN SODIUM (PORCINE) 5000 UNIT/ML IJ SOLN
5000.0000 [IU] | Freq: Three times a day (TID) | INTRAMUSCULAR | Status: DC
  Administered 2019-08-13 – 2019-08-17 (×13): 5000 [IU] via SUBCUTANEOUS
  Filled 2019-08-13 (×11): qty 1

## 2019-08-13 MED ORDER — DORZOLAMIDE HCL-TIMOLOL MAL 2-0.5 % OP SOLN
1.0000 [drp] | Freq: Two times a day (BID) | OPHTHALMIC | Status: DC
  Administered 2019-08-13 – 2019-08-18 (×10): 1 [drp] via OPHTHALMIC
  Filled 2019-08-13: qty 10

## 2019-08-13 MED ORDER — ASPIRIN 81 MG PO CHEW
81.0000 mg | CHEWABLE_TABLET | Freq: Every day | ORAL | Status: DC
  Administered 2019-08-13 – 2019-08-14 (×2): 81 mg via ORAL
  Filled 2019-08-13 (×2): qty 1

## 2019-08-13 MED ORDER — ONDANSETRON HCL 4 MG PO TABS: 4.0000 mg | ORAL_TABLET | Freq: Four times a day (QID) | ORAL | Status: DC | PRN

## 2019-08-13 MED ORDER — QUETIAPINE FUMARATE 100 MG PO TABS: 100.0000 mg | ORAL_TABLET | Freq: Every day | ORAL | Status: DC

## 2019-08-13 NOTE — H&P (Signed)
History and Physical    Taylor Roth DGU:[id number] DOB: 03-21-1928 DOA: 08/12/2019  PCP: Gayland Curry, DO   Patient coming from: SNF   Chief Complaint: Abnormal labs   HPI: Taylor Roth is a 86 y.o. female with medical history significant for COPD, advanced dementia, anxiety, bipolar disorder, and CKD 3a, now presenting to the emergency department from her SNF for evaluation of abnormal laboratory studies.  Patient is unable to contribute to the history due to her clinical condition with advanced dementia.  Patient has had poor appetite and weight loss, had been started on Remeron, and has been following with her PCP recently for hypercalcemia and hypernatremia.  She was given 2 L of half-normal saline a few days ago at the nursing facility and seemed to be improving with that but repeat labs showed worsening for which she was sent to the ED.  ED Course: Upon arrival to the ED, patient is found to be afebrile, saturating well on room air, and with blood pressure 186/105.  EKG features sinus rhythm with PVCs and LAFB.  Chemistry panel is notable for sodium of 152, calcium 12.1, BUN 45, and creatinine 1.70, up from 1.0 on June 8.  COVID-19 screening test not yet resulted.  Patient was given a 500 cc bolus of normal saline in the ED.   Review of Systems:  Unable to complete ROS secondary the patient's clinical condition.   Past Medical History:  Diagnosis Date  . Anxiety   . Arterial tortuosity (aorta) 12/11/2012  . Asthma   . Chronic kidney disease, stage IV (severe) ([place]) 12/11/2012  . Chronic mental illness   . CKD (chronic kidney disease), stage III 01/14/2015  . Closed hip fracture requiring operative repair, right, with routine healing, subsequent encounter 12/19/2015  . Colitis   . COPD (chronic obstructive pulmonary disease) ([place])   . Decreased rectal sphincter tone 11/21/2012  . Depression   . Drug-seeking behavior 02/26/2011   Use of multiple md to obtain  benzos/narcotics pt must sign contract to receive controlled meds.   Marland Kitchen Dysphagia 01/11/2015  . GERD (gastroesophageal reflux disease)   . Glaucoma 06/26/2011  . Glaucoma of both eyes   . Hearing loss, sensorineural 03/04/2014   [facility]; 02/26/14. There is not a medical or surgical treatment that would benefit her type of hearing loss. We discussed amplification in an overview fashion.    Marland Kitchen History of fall   . Insomnia, unspecified   . Macular degeneration 06/26/2011  . Migraines   . Nasal bone fracture 03/30/2014  . Osteoporosis   . Osteoporosis 02/26/2011  . Presence of right artificial hip joint   . Protein-calorie malnutrition, severe ([place]) 12/10/2012  . S/P ORIF (open reduction internal fixation) fracture 01/08/2016  . Schatzki's ring 01/14/2015  . Scoliosis   . Substance abuse ([place])    Benbzos and Narcotics, she does not get any of those medicines from [facility], we have empathetically told her that.  . Vertigo   . Vitamin B deficiency   . Vitamin D deficiency 08/25/2016  . Weight loss     Past Surgical History:  Procedure Laterality Date  . ABDOMINAL HYSTERECTOMY    . CATARACT EXTRACTION    . CHOLECYSTECTOMY    . ESOPHAGOGASTRODUODENOSCOPY (EGD) WITH PROPOFOL N/A 01/13/2015   Procedure: ESOPHAGOGASTRODUODENOSCOPY (EGD) WITH PROPOFOL;  Surgeon: [name], MD;  Location: [facility];  Service: Endoscopy;  Laterality: N/A;  . HIP ARTHROPLASTY Right 12/21/2015   Procedure: ARTHROPLASTY BIPOLAR HIP (HEMIARTHROPLASTY);  Surgeon: [name], MD;  Location: [facility];  Service: Orthopedics;  Laterality: Right;  . PARTIAL HYSTERECTOMY       reports that she has quit smoking. Her smoking use included cigarettes. She smoked 0.50 packs per day. She has never used smokeless tobacco. She reports that she does not drink alcohol and does not use drugs.  Allergies  Allergen Reactions  . Amoxicillin     Per daughter  . Ampicillin     Per daughter  . Depakote [Valproic Acid]      DOES NOT WANT HER TO TAKE IT==Makes her hair fall out= per daughter who is poa  . Macrobid [Nitrofurantoin Macrocrystal] Rash    Per MAR  . Penicillins     Per daughter  . Acetaminophen Other (See Comments)    jaundice  . Azithromycin Other (See Comments)    Per MAR  . Butalbital   . Caffeine   . Doxycycline Other (See Comments)    Per MAR  . Escitalopram Oxalate Other (See Comments)    Per MAR  . Fioricet [Butalbital-Apap-Caffeine] Other (See Comments)    Drunk. Per MAR  . Latex Other (See Comments)    Per MAR  . Levofloxacin Other (See Comments)    Per MAR  . Levofloxacin   . Nsaids Other (See Comments)    This is not an allergy. The patient was told by Dr. Rockne Menghini to avoid NSAIDs and stop Vicoprofen in to 2014 to avoid nephrotoxicity.  Per MAR.  Marland Kitchen Oxycodone Other (See Comments)    reaction to synthetic codeine Per MAR  . Restasis [Cyclosporine] Other (See Comments)    Per MAR  . Sulfa Antibiotics Other (See Comments)    Per MAR  . Biaxin [Clarithromycin] Rash    With burning sensation Per MAR    Family History  Problem Relation Age of Onset  . Stroke Mother   . Diabetes Mother   . Heart disease Mother   . Hearing loss Mother   . Cancer Father 68       esophageal     Prior to Admission medications   Medication Sig Start Date End Date Taking? Authorizing Provider  acetaminophen (TYLENOL) 500 MG tablet Give 2 tablets ( 1,000 mg total ) by mouth nightly for pain   Yes [provider]  albuterol (PROVENTIL HFA;VENTOLIN HFA) 108 (90 BASE) MCG/ACT inhaler Inhale 1 puff into the lungs every 6 (six) hours as needed for shortness of breath.    Yes [provider]  aspirin 81 MG chewable tablet Chew 81 mg by mouth daily.   Yes [provider]  Biotin 5000 MCG CAPS Take 1 capsule by mouth daily.    Yes [provider]  citalopram (CELEXA) 10 MG tablet Take 10 mg by mouth daily.   Yes [provider]  Cranberry 450 MG TABS Take  1 tablet by mouth 2 (two) times daily.    Yes [provider]  docusate sodium (COLACE) 100 MG capsule Take 100 mg by mouth 2 (two) times daily.   Yes [provider]  ferrous sulfate 325 (65 FE) MG tablet Take 325 mg by mouth daily.    Yes [provider]  fludrocortisone (FLORINEF) 0.1 MG tablet Take 0.1 mg by mouth every morning. for orthostatic hypotension   Yes [provider]  lactulose (CHRONULAC) 10 GM/15ML solution Take 15 mLs (10 g total) by mouth daily as needed for severe constipation. 08/12/15  Yes [name], MD  lidocaine (  LIDODERM) 5 % APPLY 1 PATCH TO ANT. LEFT THIGH DAILY LEAVE ON X 12 HOURS THEN REMOVE 06/18/19  Yes [provider]  loratadine (CLARITIN) 10 MG tablet Take 10 mg by mouth daily.    Yes [provider]  magnesium hydroxide (MILK OF MAGNESIA) 400 MG/5ML suspension If no BM in 3 days, give 30 cc Milk of Magnesium p.o. x 1 dose in 24 hours as needed (Do not use standing constipation orders for residents with renal failure CFR less than 30. Contact MD for orders) (Physician Order)   Yes [provider]  mirtazapine (REMERON) 15 MG tablet Take 7.5 mg by mouth at bedtime.   Yes [provider]  nitroGLYCERIN (NITROSTAT) 0.4 MG SL tablet Place 0.4 mg under the tongue every 5 (five) minutes as needed for chest pain. May use 3 times   Yes [provider]  NON FORMULARY Diet: Pureed Diet, Honey Thickened Liquids - to be given by spoon.   Yes [provider]  Polyethyl Glycol-Propyl Glycol (SYSTANE) 0.4-0.3 % SOLN Place 2 drops into both eyes every 2 (two) hours as needed (dry eyes).    Yes [provider]  polyethylene glycol (MIRALAX / GLYCOLAX) packet Take 17 g by mouth daily.    Yes [provider]  potassium chloride (KLOR-CON) 10 MEQ tablet Take 20 mEq by mouth daily. 08/09/19  Yes [provider]  QUEtiapine (SEROQUEL) 100 MG tablet Take 100 mg by mouth at  bedtime. Take along with a 25 mg tablet to = 125 mg qhs   Yes [provider]  QUEtiapine (SEROQUEL) 25 MG tablet Take 25 mg by mouth at bedtime. Take along with a 100 mg tablet to = 125 mg qhs   Yes [provider]  senna (SENOKOT) 8.6 MG tablet Take 2 tablets by mouth 2 (two) times daily as needed for constipation.   Yes [provider]  sodium chloride 0.45 % solution INFUSE 2 LITERS @@75ML /HR VIA MIDLINE FOR HYDRATION 08/05/19  Yes [provider]  Sodium Phosphates (RA SALINE ENEMA RE) If not relieved by Biscodyl suppository, give disposable Saline Enema rectally X 1 dose/24 hrs as needed (Do not use constipation standing orders for residents with renal failure/CFR less than 30. Contact MD for orders)(Physician Or   Yes [provider]  Cholecalciferol (VITAMIN D3) 1.25 MG (50000 UT) CAPS Take 1 capsule by mouth once a week.    [provider]  dorzolamide-timolol (COSOPT) 22.3-6.8 MG/ML ophthalmic solution Place 1 drop into both eyes 2 (two) times daily.    [provider]  ENSURE (ENSURE) Take 237 mLs by mouth 4 (four) times daily. D/t weight loss    [provider]  latanoprost (XALATAN) 0.005 % ophthalmic solution Place 1 drop into both eyes at bedtime. 06/26/19   [provider]    Physical Exam: Vitals:   08/12/19 1942 08/12/19 2220 08/13/19 0000 08/13/19 0100  BP: (!) 148/98 (!) 156/101 (!) 186/105 (!) 160/112  Pulse: 72 73 75 66  Resp:  16 15 14   Temp:      TempSrc:      SpO2: 98% 98% 99% 99%  Weight:      Height:        Constitutional: NAD, cachetic   Eyes: PERTLA, lids and conjunctivae normal ENMT: Mucous membranes are moist. Posterior pharynx clear of any exudate or lesions.   Neck: normal, supple, no masses, no thyromegaly Respiratory: no wheezing, no crackles. No accessory muscle use.  Cardiovascular:  S1 & S2 heard, regular rate and rhythm. No extremity edema.   Abdomen: No distension, no  tenderness, soft. Bowel sounds active.  Musculoskeletal: no clubbing / cyanosis. No joint deformity upper and lower extremities.   Skin: Poor turgor. Warm, dry, well-perfused. Neurologic: CN 2-12 grossly intact. Sensation intact. Moving all extremities.  Psychiatric: Awake and looks towards voice but does not answer basic questions or follow simple commands. Calm.     Labs and Imaging on Admission: I have personally reviewed following labs and imaging studies  CBC: Recent Labs  Lab 08/12/19 1944  WBC 9.6  NEUTROABS 6.6  HGB 11.5*  HCT 37.2  MCV 93.0  PLT 211   Basic Metabolic Panel: Recent Labs  Lab 08/12/19 1944  NA 152*  K 3.5  CL 112*  CO2 24  GLUCOSE 102*  BUN 45*  CREATININE 1.70*  CALCIUM 12.1*  MG 2.5*  PHOS 3.3   GFR: Estimated Creatinine Clearance: 13 mL/min (A) (by C-G formula based on SCr of 1.7 mg/dL (H)). Liver Function Tests: Recent Labs  Lab 08/12/19 1944  AST 17  ALT 15  ALKPHOS 83  BILITOT 0.3  PROT 7.5  ALBUMIN 3.7   No results for input(s): LIPASE, AMYLASE in the last 168 hours. No results for input(s): AMMONIA in the last 168 hours. Coagulation Profile: No results for input(s): INR, PROTIME in the last 168 hours. Cardiac Enzymes: No results for input(s): CKTOTAL, CKMB, CKMBINDEX, TROPONINI in the last 168 hours. BNP (last 3 results) No results for input(s): PROBNP in the last 8760 hours. HbA1C: No results for input(s): HGBA1C in the last 72 hours. CBG: No results for input(s): GLUCAP in the last 168 hours. Lipid Profile: No results for input(s): CHOL, HDL, LDLCALC, TRIG, CHOLHDL, LDLDIRECT in the last 72 hours. Thyroid Function Tests: No results for input(s): TSH, T4TOTAL, FREET4, T3FREE, THYROIDAB in the last 72 hours. Anemia Panel: No results for input(s): VITAMINB12, FOLATE, FERRITIN, TIBC, IRON, RETICCTPCT in the last 72 hours. Urine analysis:    Component Value Date/Time   COLORURINE AMBER (A) 06/04/2018 1142    APPEARANCEUR TURBID (A) 06/04/2018 1142   LABSPEC 1.019 06/04/2018 1142   PHURINE 6.0 06/04/2018 1142   GLUCOSEU NEGATIVE 06/04/2018 1142   HGBUR SMALL (A) 06/04/2018 1142   BILIRUBINUR NEGATIVE 06/04/2018 1142   BILIRUBINUR neg 06/20/2013 1736   KETONESUR NEGATIVE 06/04/2018 1142   PROTEINUR 100 (A) 06/04/2018 1142   UROBILINOGEN 0.2 12/25/2014 1620   NITRITE NEGATIVE 06/04/2018 1142   LEUKOCYTESUR MODERATE (A) 06/04/2018 1142   Sepsis Labs: @LABRCNTIP (procalcitonin:4,lacticidven:4) )No results found for this or any previous visit (from the past 240 hour(s)).   Radiological Exams on Admission: No results found.  EKG: Independently reviewed. Sinus rhythm, PVC, LAFB.   Assessment/Plan   1. Acute kidney injury superimposed on CKD IIIa - Presents from SNF with worsening hypernatremia and renal function despite receiving 2 liters of 0.45% saline a few days ago at her SNF  - She has advanced dementia, has had poor appetite and wt loss that has been followed by her PCP and the idea of comfort care was discussed with patient's daughter recently per chart review - daughter wanted to try treating with IVF and antibiotics if indicated  - This is likely prerenal azotemia secondary to poor intake  - Continue IVF hydration, check UA and urine chemistries, renally-dose medications, avoid nephrotoxins, repeat chem panel in am     2. Hypercalcemia  - Serum calcium is 12.1 in ED, similar to recent  priors  - Check PTH, continue IVF hydration, repeat chemistries in am   3. Hypernatremia  - Serum sodium is 152 in setting of hypovolemia, worse despite receiving 0.45% saline infusion at SNF   - She was given 500 cc NS in ED  - Continue IVF hydration with 0.45% saline, repeat chem panel in am   4. Orthostatic hypotension; hypertension   - BP 186/100 in ED - Hold Florinef, treat hypertension as-needed   5. Bipolar disorder  - She is calm in ED  - Continue Celexa, Remeron, and Seroquel    6.  COPD  - Treated with Rocephin a week ago for respiratory illness that appears to have resolved - No cough or wheezing on admission, appears stable  - Continue albuterol    DVT prophylaxis: sq heparin   Code Status: DNR  Family Communication: Discussed with patient  Disposition Plan:  Patient is from: SNF  Anticipated d/c is to: SNF Anticipated d/c date is: 08/15/19 Patient currently: Being requiring further treatment and evaluation of AKI and electrolyte disturbances  Consults called: None  Admission status: Inpatient     Vianne Bulls, MD [name] Pager: See [email or web address]  If 7AM-7PM, please contact the daytime attending [email or web address]  08/13/2019, 2:13 AM

## 2019-08-13 NOTE — ED Notes (Signed)
Attempted to call report- no answer from floor

## 2019-08-13 NOTE — Progress Notes (Signed)
LUE midline in place, placed at outside facility. Brisk blood return noted, labs drawn.

## 2019-08-13 NOTE — Progress Notes (Addendum)
Patient immediately begins removing telemetry stickers when RN and NT try to place them on patient.  RN communicated this with the MD. MD discontinued telemetry order.

## 2019-08-13 NOTE — Progress Notes (Signed)
Patient seen examined and reviewed the note from my partner who admitted this patient this morning Database reviewed Patient is a resident of skilled nursing facility who is being treated in the outpatient setting for possible pneumonia over the past 7 to 12 days-IV fluids were started in the outpatient setting secondary to volume depletion in addition to severe hypernatremia I have reviewed the patient today and she is arousable however quite confused and she has no insight into her care Discussed with Dr. Hollace Kinnier her outpatient physician who agrees with goals of care initiation She is already DNR we will not really escalate care beyond fluids and I will ask palliative care to look in on the patient prior to discharge and may be coordinate outpatient follow-up to further delineate the same  I have called 3 separate numbers in the chart and have not been able to reach anybody who is related to this lady We will reassess again tomorrow I would hold off on aggressive lab monitoring given DNR state and advanced dementia and only repeat labs daily

## 2019-08-14 LAB — COMPREHENSIVE METABOLIC PANEL
ALT: 17 U/L (ref 0–44)
AST: 21 U/L (ref 15–41)
Albumin: 3.8 g/dL (ref 3.5–5.0)
Alkaline Phosphatase: 77 U/L (ref 38–126)
Anion gap: 10 (ref 5–15)
BUN: 39 mg/dL — ABNORMAL HIGH (ref 8–23)
CO2: 26 mmol/L (ref 22–32)
Calcium: 11.4 mg/dL — ABNORMAL HIGH (ref 8.9–10.3)
Chloride: 111 mmol/L (ref 98–111)
Creatinine, Ser: 1.54 mg/dL — ABNORMAL HIGH (ref 0.44–1.00)
GFR calc Af Amer: 35 mL/min — ABNORMAL LOW (ref >60)
GFR calc non Af Amer: 30 mL/min — ABNORMAL LOW (ref >60)
Glucose, Bld: 162 mg/dL — ABNORMAL HIGH (ref 70–99)
Potassium: 4 mmol/L (ref 3.5–5.1)
Sodium: 147 mmol/L — ABNORMAL HIGH (ref 135–145)
Total Bilirubin: 0.4 mg/dL (ref 0.3–1.2)
Total Protein: 7.4 g/dL (ref 6.5–8.1)

## 2019-08-14 LAB — PTH, INTACT AND CALCIUM
Calcium, Total (PTH): 11.7 mg/dL — ABNORMAL HIGH (ref 8.7–10.3)
PTH: 9 pg/mL — ABNORMAL LOW (ref 15–65)

## 2019-08-14 LAB — CBC
HCT: 34.8 % — ABNORMAL LOW (ref 36.0–46.0)
Hemoglobin: 11.1 g/dL — ABNORMAL LOW (ref 12.0–15.0)
MCH: 28.9 pg (ref 26.0–34.0)
MCHC: 31.9 g/dL (ref 30.0–36.0)
MCV: 90.6 fL (ref 80.0–100.0)
Platelets: 360 10*3/uL (ref 150–400)
RBC: 3.84 MIL/uL — ABNORMAL LOW (ref 3.87–5.11)
RDW: 14.5 % (ref 11.5–15.5)
WBC: 8.7 10*3/uL (ref 4.0–10.5)
nRBC: 0 % (ref 0.0–0.2)

## 2019-08-14 MED ORDER — DEXTROSE IN LACTATED RINGERS 5 % IV SOLN: INTRAVENOUS | Status: DC

## 2019-08-14 MED ORDER — QUETIAPINE FUMARATE 100 MG PO TABS
100.0000 mg | ORAL_TABLET | Freq: Every day | ORAL | Status: DC
  Administered 2019-08-14 – 2019-08-17 (×4): 100 mg via ORAL
  Filled 2019-08-14 (×4): qty 1

## 2019-08-14 NOTE — ED Provider Notes (Signed)
[facility] Provider Note   CSN: [id number] Arrival date & time: 08/12/19  1704     History Chief Complaint  Patient presents with  . Abnormal Lab    Taylor Roth is a 86 y.o. female.  HPI    All 5 caveat for severe dementia.  86 y.o. female with medical history significant for COPD, advanced dementia, anxiety, bipolar disorder, and CKD presenting to the emergency department from her SNF for evaluation of abnormal laboratory studies.   Call the nursing home and nursing staff reported essentially that the provider asked patient to come to the ER for hypercalcemia.  It appears that she has been having hypercalcemia for the last week or so.  Patient is denying any abdominal pain, constipation.  Past Medical History:  Diagnosis Date  . Anxiety   . Arterial tortuosity (aorta) 12/11/2012  . Asthma   . Chronic kidney disease, stage IV (severe) ([place]) 12/11/2012  . Chronic mental illness   . CKD (chronic kidney disease), stage III 01/14/2015  . Closed hip fracture requiring operative repair, right, with routine healing, subsequent encounter 12/19/2015  . Colitis   . COPD (chronic obstructive pulmonary disease) ([place])   . Decreased rectal sphincter tone 11/21/2012  . Depression   . Drug-seeking behavior 02/26/2011   Use of multiple md to obtain benzos/narcotics pt must sign contract to receive controlled meds.   Marland Kitchen Dysphagia 01/11/2015  . GERD (gastroesophageal reflux disease)   . Glaucoma 06/26/2011  . Glaucoma of both eyes   . Hearing loss, sensorineural 03/04/2014   [facility]; 02/26/14. There is not a medical or surgical treatment that would benefit her type of hearing loss. We discussed amplification in an overview fashion.    Marland Kitchen History of fall   . Insomnia, unspecified   . Macular degeneration 06/26/2011  . Migraines   . Nasal bone fracture 03/30/2014  . Osteoporosis   . Osteoporosis 02/26/2011  . Presence of right artificial  hip joint   . Protein-calorie malnutrition, severe ([place]) 12/10/2012  . S/P ORIF (open reduction internal fixation) fracture 01/08/2016  . Schatzki's ring 01/14/2015  . Scoliosis   . Substance abuse ([place])    Benbzos and Narcotics, she does not get any of those medicines from [facility], we have empathetically told her that.  . Vertigo   . Vitamin B deficiency   . Vitamin D deficiency 08/25/2016  . Weight loss     Patient Active Problem List   Diagnosis Date Noted  . Acute renal failure superimposed on stage 3a chronic kidney disease ([place]) 08/13/2019  . Hypernatremia 08/13/2019  . Hypercalcemia 08/13/2019  . Allergic rhinitis 06/09/2018  . Pressure injury of skin 06/05/2018  . MRSA bacteremia 06/04/2018  . Chronic generalized pain 05/16/2018  . Dry eye syndrome of both eyes 02/28/2018  . Abnormal EKG 01/24/2017  . Delusions ([place])   . Paranoia ([place])   . Vitamin D deficiency 08/25/2016  . Advanced dementia ([place]) 03/25/2016  . S/P ORIF (open reduction internal fixation) fracture 01/08/2016  . Oral thrush 01/08/2016  . HTN (hypertension) 01/08/2016  . Closed hip fracture requiring operative repair, right, with routine healing, subsequent encounter 12/19/2015  . Bipolar disease, chronic ([place]) 12/17/2015  . Decubitus ulcer of hip, stage 3 ([place]) 12/11/2015  . Legally blind 08/16/2015  . Ankle pain   . Encephalopathy   . Altered mental status 01/26/2015  . Nausea and vomiting 01/17/2015  . Esophageal dysmotilities   . Urinary tract infectious disease   .  Weakness 01/14/2015  . Schatzki's ring 01/14/2015  . CKD (chronic kidney disease), stage III ([place]) 01/14/2015  . Esophagus disorder   . Dysphagia 01/11/2015  . Lower urinary tract infectious disease 03/30/2014  . Hearing loss, sensorineural 03/04/2014  . Nasal lesion 03/04/2014  . Constipation 12/12/2012  . Hypokalemia 12/11/2012  . Orthostatic hypotension 12/11/2012  . Arterial tortuosity (aorta) 12/11/2012  . Protein-calorie  malnutrition, severe ([place]) 12/10/2012  . Adult failure to thrive 12/09/2012  . Decreased rectal sphincter tone 11/21/2012  . Primary open angle glaucoma of both eyes, severe stage 11/11/2012  . Idiopathic scoliosis 07/08/2012  . Macular atrophy, retinal 06/26/2011  . Vision disturbance 06/26/2011  . Glaucoma 06/26/2011  . Nonexudative senile macular degeneration of retina 06/21/2011  . Macula scar of posterior pole 06/21/2011  . Status post intraocular lens implant 06/21/2011  . Anemia of chronic disease 04/17/2011  . Drug-seeking behavior 02/26/2011  . Osteoporosis 02/26/2011  . Depression   . Anxiety   . COPD (chronic obstructive pulmonary disease) ([place])   . GERD (gastroesophageal reflux disease)   . Migraines   . Insomnia, unspecified     Past Surgical History:  Procedure Laterality Date  . ABDOMINAL HYSTERECTOMY    . CATARACT EXTRACTION    . CHOLECYSTECTOMY    . ESOPHAGOGASTRODUODENOSCOPY (EGD) WITH PROPOFOL N/A 01/13/2015   Procedure: ESOPHAGOGASTRODUODENOSCOPY (EGD) WITH PROPOFOL;  Surgeon: [name], MD;  Location: [facility];  Service: Endoscopy;  Laterality: N/A;  . HIP ARTHROPLASTY Right 12/21/2015   Procedure: ARTHROPLASTY BIPOLAR HIP (HEMIARTHROPLASTY);  Surgeon: [name], MD;  Location: [facility];  Service: Orthopedics;  Laterality: Right;  . PARTIAL HYSTERECTOMY       OB History   No obstetric history on file.     Family History  Problem Relation Age of Onset  . Stroke Mother   . Diabetes Mother   . Heart disease Mother   . Hearing loss Mother   . Cancer Father 5       esophageal    Social History   Tobacco Use  . Smoking status: Former Smoker    Packs/day: 0.50    Types: Cigarettes  . Smokeless tobacco: Never Used  Vaping Use  . Vaping Use: Never used  Substance Use Topics  . Alcohol use: No  . Drug use: No    Home Medications Prior to Admission medications   Medication Sig Start Date End Date Taking? Authorizing Provider    acetaminophen (TYLENOL) 500 MG tablet Give 2 tablets ( 1,000 mg total ) by mouth nightly for pain   Yes [provider]  albuterol (PROVENTIL HFA;VENTOLIN HFA) 108 (90 BASE) MCG/ACT inhaler Inhale 1 puff into the lungs every 6 (six) hours as needed for shortness of breath.    Yes [provider]  aspirin 81 MG chewable tablet Chew 81 mg by mouth daily.   Yes [provider]  Biotin 5000 MCG CAPS Take 1 capsule by mouth daily.    Yes [provider]  citalopram (CELEXA) 10 MG tablet Take 10 mg by mouth daily.   Yes [provider]  Cranberry 450 MG TABS Take 1 tablet by mouth 2 (two) times daily.    Yes [provider]  docusate sodium (COLACE) 100 MG capsule Take 100 mg by mouth 2 (two) times daily.   Yes [provider]  ferrous sulfate 325 (65 FE) MG tablet Take 325 mg by mouth daily.    Yes [provider]  fludrocortisone (FLORINEF) 0.1  MG tablet Take 0.1 mg by mouth every morning. for orthostatic hypotension   Yes [provider]  lactulose (CHRONULAC) 10 GM/15ML solution Take 15 mLs (10 g total) by mouth daily as needed for severe constipation. 08/12/15  Yes [name], MD  lidocaine (LIDODERM) 5 % APPLY 1 PATCH TO ANT. LEFT THIGH DAILY LEAVE ON X 12 HOURS THEN REMOVE 06/18/19  Yes [provider]  loratadine (CLARITIN) 10 MG tablet Take 10 mg by mouth daily.    Yes [provider]  magnesium hydroxide (MILK OF MAGNESIA) 400 MG/5ML suspension If no BM in 3 days, give 30 cc Milk of Magnesium p.o. x 1 dose in 24 hours as needed (Do not use standing constipation orders for residents with renal failure CFR less than 30. Contact MD for orders) (Physician Order)   Yes [provider]  mirtazapine (REMERON) 15 MG tablet Take 7.5 mg by mouth at bedtime.   Yes [provider]  nitroGLYCERIN (NITROSTAT) 0.4 MG SL tablet Place 0.4 mg under the tongue every 5 (five) minutes as needed for  chest pain. May use 3 times   Yes [provider]  NON FORMULARY Diet: Pureed Diet, Honey Thickened Liquids - to be given by spoon.   Yes [provider]  Polyethyl Glycol-Propyl Glycol (SYSTANE) 0.4-0.3 % SOLN Place 2 drops into both eyes every 2 (two) hours as needed (dry eyes).    Yes [provider]  polyethylene glycol (MIRALAX / GLYCOLAX) packet Take 17 g by mouth daily.    Yes [provider]  potassium chloride (KLOR-CON) 10 MEQ tablet Take 20 mEq by mouth daily. 08/09/19  Yes [provider]  QUEtiapine (SEROQUEL) 100 MG tablet Take 100 mg by mouth at bedtime. Take along with a 25 mg tablet to = 125 mg qhs   Yes [provider]  QUEtiapine (SEROQUEL) 25 MG tablet Take 25 mg by mouth at bedtime. Take along with a 100 mg tablet to = 125 mg qhs   Yes [provider]  senna (SENOKOT) 8.6 MG tablet Take 2 tablets by mouth 2 (two) times daily as needed for constipation.   Yes [provider]  sodium chloride 0.45 % solution INFUSE 2 LITERS @@75ML /HR VIA MIDLINE FOR HYDRATION 08/05/19  Yes [provider]  Sodium Phosphates (RA SALINE ENEMA RE) If not relieved by Biscodyl suppository, give disposable Saline Enema rectally X 1 dose/24 hrs as needed (Do not use constipation standing orders for residents with renal failure/CFR less than 30. Contact MD for orders)(Physician Or   Yes [provider]  Cholecalciferol (VITAMIN D3) 1.25 MG (50000 UT) CAPS Take 1 capsule by mouth once a week.    [provider]  dorzolamide-timolol (COSOPT) 22.3-6.8 MG/ML ophthalmic solution Place 1 drop into both eyes 2 (two) times daily.    [provider]  ENSURE (ENSURE) Take 237 mLs by mouth 4 (four) times daily. D/t weight loss    [provider]  latanoprost (XALATAN) 0.005 % ophthalmic solution Place 1 drop into both eyes at bedtime. 06/26/19   [provider]    Allergies    Amoxicillin,  Ampicillin, Depakote [valproic acid], Macrobid [nitrofurantoin macrocrystal], Penicillins, Acetaminophen, Azithromycin, Butalbital, Caffeine, Chlorhexidine, Doxycycline, Escitalopram oxalate, Fioricet [butalbital-apap-caffeine], Latex, Levofloxacin, Levofloxacin, Nsaids, Oxycodone, Restasis [cyclosporine], Sulfa antibiotics, and Biaxin [clarithromycin]  Review of Systems   Review of Systems  Unable to perform ROS: Dementia    Physical Exam Updated Vital Signs BP 129/83   Pulse (!) 50  Temp 97.6 F (36.4 C) (Oral)   Resp 18   Ht 4\' 11"  (1.499 m)   Wt 32.8 kg   LMP  (LMP Unknown)   SpO2 98%   BMI 14.60 kg/m   Physical Exam Vitals and nursing note reviewed.  Constitutional:      Appearance: She is well-developed.  HENT:     Head: Normocephalic and atraumatic.  Cardiovascular:     Rate and Rhythm: Normal rate.  Pulmonary:     Effort: Pulmonary effort is normal.  Abdominal:     General: Bowel sounds are normal.  Musculoskeletal:     Cervical back: Normal range of motion and neck supple.  Skin:    General: Skin is warm and dry.  Neurological:     Mental Status: She is alert. Mental status is at baseline.     ED Results / Procedures / Treatments   Labs (all labs ordered are listed, but only abnormal results are displayed) Labs Reviewed  COMPREHENSIVE METABOLIC PANEL - Abnormal; Notable for the following components:      Result Value   Sodium 152 (*)    Chloride 112 (*)    Glucose, Bld 102 (*)    BUN 45 (*)    Creatinine, Ser 1.70 (*)    Calcium 12.1 (*)    GFR calc non Af Amer 26 (*)    GFR calc Af Amer 31 (*)    Anion gap 16 (*)    All other components within normal limits  CBC WITH DIFFERENTIAL/PLATELET - Abnormal; Notable for the following components:   Hemoglobin 11.5 (*)    All other components within normal limits  MAGNESIUM - Abnormal; Notable for the following components:   Magnesium 2.5 (*)    All other components within normal limits  URINALYSIS,  COMPLETE (UACMP) WITH MICROSCOPIC - Abnormal; Notable for the following components:   Leukocytes,Ua LARGE (*)    All other components within normal limits  PTH, INTACT AND CALCIUM - Abnormal; Notable for the following components:   PTH 9 (*)    Calcium, Total (PTH) 11.7 (*)    All other components within normal limits  CBC - Abnormal; Notable for the following components:   Hemoglobin 11.4 (*)    HCT 35.9 (*)    All other components within normal limits  COMPREHENSIVE METABOLIC PANEL - Abnormal; Notable for the following components:   Sodium 151 (*)    Chloride 114 (*)    Glucose, Bld 109 (*)    BUN 41 (*)    Creatinine, Ser 1.49 (*)    Calcium 11.3 (*)    GFR calc non Af Amer 31 (*)    GFR calc Af Amer 36 (*)    All other components within normal limits  CBC - Abnormal; Notable for the following components:   RBC 3.84 (*)    Hemoglobin 11.1 (*)    HCT 34.8 (*)    All other components within normal limits  COMPREHENSIVE METABOLIC PANEL - Abnormal; Notable for the following components:   Sodium 147 (*)    Glucose, Bld 162 (*)    BUN 39 (*)    Creatinine, Ser 1.54 (*)    Calcium 11.4 (*)    GFR calc non Af Amer 30 (*)    GFR calc Af Amer 35 (*)    All other components within normal limits  SARS CORONAVIRUS 2 BY RT PCR (HOSPITAL ORDER, [place] [facility])  PHOSPHORUS  SODIUM, URINE, RANDOM  CREATININE, URINE, RANDOM  CBC    EKG EKG Interpretation  Date/Time:  Wednesday August 12 2019 17:41:10 EDT Ventricular Rate:  72 PR Interval:    QRS Duration: 93 QT Interval:  396 QTC Calculation: 434 R Axis:   -85 Text Interpretation: Sinus rhythm Ventricular premature complex Left anterior fascicular block Consider right ventricular hypertrophy No acute changes No significant change since last tracing Confirmed by Varney Biles [phone]) on 08/13/2019 1:30:01 AM   Radiology No results found.  Procedures Procedures (including critical care  time)  Medications Ordered in ED Medications  0.45 % sodium chloride infusion (0 mLs Intravenous Stopped 08/13/19 1505)  labetalol (NORMODYNE) injection 10 mg (10 mg Intravenous Given 08/13/19 1457)  citalopram (CELEXA) tablet 10 mg (10 mg Oral Given 08/14/19 1006)  dorzolamide-timolol (COSOPT) 22.3-6.8 MG/ML ophthalmic solution 1 drop (1 drop Both Eyes Given 08/14/19 1009)  latanoprost (XALATAN) 0.005 % ophthalmic solution 1 drop (1 drop Both Eyes Given 08/13/19 2210)  lidocaine (LIDODERM) 5 % 1 patch (1 patch Transdermal Patch Applied 08/14/19 1008)  heparin injection 5,000 Units (5,000 Units Subcutaneous Given 08/14/19 1358)  sodium chloride flush (NS) 0.9 % injection 3 mL (3 mLs Intravenous Not Given 08/14/19 1009)  ondansetron (ZOFRAN) tablet 4 mg (has no administration in time range)  albuterol (PROVENTIL) (2.5 MG/3ML) 0.083% nebulizer solution 2.5 mg (has no administration in time range)  sodium chloride flush (NS) 0.9 % injection 10-40 mL (10 mLs Intracatheter Not Given 08/14/19 1009)  sodium chloride flush (NS) 0.9 % injection 10-40 mL (10 mLs Intracatheter Given 08/14/19 0415)  Resource ThickenUp Clear (has no administration in time range)  QUEtiapine (SEROQUEL) tablet 100 mg (has no administration in time range)  dextrose 5 % in lactated ringers infusion ( Intravenous New Bag/Given 08/14/19 1755)  sodium chloride 0.9 % bolus 500 mL (500 mLs Intravenous New Bag/Given 08/13/19 0146)    ED Course  I have reviewed the triage vital signs and the nursing notes.  Pertinent labs & imaging results that were available during my care of the patient were reviewed by me and considered in my medical decision making (see chart for details).    MDM Rules/Calculators/A&P                          86 year old female with history of COPD, dementia comes in a chief complaint of elevated potassium.  Patient is not a good historian given her advanced dementia.  She is denying any symptoms of concern from her side.  EKG  is not showing any acute concerns.  Patient needs to be admitted to the hospital for optimization of the hypercalcemia and ideally figure out the reason why.  Cancer with her age certainly could be a possibility. Stable for admission.  Final Clinical Impression(s) / ED Diagnoses Final diagnoses:  Hypercalcemia    Rx / DC Orders ED Discharge Orders    None       Varney Biles, MD 08/14/19 1901

## 2019-08-14 NOTE — Progress Notes (Signed)
Patient's daughter [name] called, updated her and she asked to speak to the MD, informed her Dr. Verlon Au will call her, not sure of the time but before 7pm today.

## 2019-08-14 NOTE — Progress Notes (Signed)
PROGRESS NOTE    Taylor Roth  PHX:[id number] DOB: [birth year] DOA: 08/12/2019 PCP: Gayland Curry, DO  Brief Narrative:  41 y [name]-[been there ~3 years] COPD Bipolar Advanced dementia Rx at facility possible aspiration pneumonia-swallow eval 6/10 showed aspiration as an outpatient-looks like attempts at volume repletion treatment of hypercalcemia and hypernatremia were ongoing at facility Finally admitted 08/13/2019 with worsening labs and worsening mental state Sodium 153 BUN/creatinine 38/1.5 Calcium 13.2 GFR down to 29 with a baseline of 50  Assessment & Plan:   Principal Problem:   Acute renal failure superimposed on stage 3a chronic kidney disease ([place]) Active Problems:   COPD (chronic obstructive pulmonary disease) ([place])   Adult failure to thrive   Bipolar disease, chronic ([place])   HTN (hypertension)   Advanced dementia ([place])   Chronic generalized pain   Hypernatremia   Hypercalcemia   1. Volume depletion, severe hypernatremia a. Currently has a 0.7 L deficit of fluid b. Correcting with D5 over 24 hours we can start her rate at 30 cc an hour as her volume depletion is resolved but her hyponatremia persists c. Please see my below discussion regarding her family's decisions 2. Hypercalcemia a. Note that vitamin D levels were performed in the outpatient setting b. PTH level was 9 calcium total was 11.7-the usual range for PTH is 8.6-10.2 c. I suspect this is primarily dehydration driven d. I will discussed with family if we should search further for malignancy related causes of hyperparathyroidism but with her severe dementia I doubt that would be useful index because she is so frail as it is and would not tolerate any type of work-up beyond lab draws 3. Advanced failure to thrive a. Patient has advanced malnutrition evidenced by a weight of 32 kg and a BMI of 14 b. I had a long discussion with her daughter 7/2 explaining my concerns about PEG tubes  and artificial nutrition 4. Advanced dementia a. Daughter relates to me that she occasionally remembers things fairly well b. I think that there is some element of geriatric depression in addition to her dementia c. I will stop her Remeron and cut back her queitipaine form 125-->100 5. Aspiration with recent treatment of aspiration pneumonia a. Periodic checks 6. Bipolar a. See above discussion  DVT prophylaxis:  Code Status: DNR Family Communication: Long discussion with daughter on telephone [phone] We went over goals of care as well as advanced planning-I have suggested after much discussion that palliative care discuss the overall goals with the family and help [place] holistically support her and the patient Her prognosis for recovery from this hospitalization is reasonable however her long-term prognosis is abysmal Disposition:   Status is: Inpatient  Remains inpatient appropriate because:Persistent severe electrolyte disturbances   Dispo: The patient is from: SNF              Anticipated d/c is to: SNF              Anticipated d/c date is: 3 days              Patient currently is not medically stable to d/c.       Consultants:   Palliative care  Procedures: None  Antimicrobials: None   Subjective: Swats my hand away I have visited the room twice and she did not open her eyes Her meal tray of pured foods remains essentially untouched she ate about 10% of her meal   Objective: Vitals:   08/13/19 1452 08/13/19  2228 08/14/19 0659 08/14/19 1503  BP: (!) 188/100 (!) 162/83 (!) 170/79 129/83  Pulse: (!) 57 (!) 57 (!) 56 (!) 50  Resp: 14 16 16 18   Temp: (!) 97.5 F (36.4 C) 98.2 F (36.8 C) 97.8 F (36.6 C) 97.6 F (36.4 C)  TempSrc: Oral Axillary Oral Oral  SpO2: 97% 100% 96% 98%  Weight:   32.8 kg   Height:        Intake/Output Summary (Last 24 hours) at 08/14/2019 1705 Last data filed at 08/14/2019 0415 Gross per 24 hour  Intake 130 ml  Output --   Net 130 ml   Filed Weights   08/12/19 1736 08/13/19 0325 08/14/19 0659  Weight: 36.1 kg 33.3 kg 32.8 kg    Examination:  General exam: Noncommunicative eyes are tightly shut she does not wish to engage Respiratory system: Clear no added sound Cardiovascular system: S1-S2 no murmur sinus Gastrointestinal system: Soft no rebound. Central nervous system: Cannot assess completely-guards and closes her eyelids tightly Extremities: Some mild contractures at knees Skin: No breakdown Psychiatry: Confused  Data Reviewed: I have personally reviewed following labs and imaging studies Sodium 147 down from 153 on admission BUN/creatinine down to 39/1.5 from 45/1.7  Radiology Studies: No results found.   Scheduled Meds: . citalopram  10 mg Oral Daily  . dorzolamide-timolol  1 drop Both Eyes BID  . heparin  5,000 Units Subcutaneous Q8H  . latanoprost  1 drop Both Eyes QHS  . lidocaine  1 patch Transdermal Q24H  . QUEtiapine  100 mg Oral QHS  . sodium chloride flush  10-40 mL Intracatheter Q12H  . sodium chloride flush  3 mL Intravenous Q12H   Continuous Infusions:   LOS: 1 day    Time spent: [place], MD [name] To contact the attending provider between 7A-7P or the covering provider during after hours 7P-7A, please log into the web site [email or web address] and access using universal [place] password for that web site. If you do not have the password, please call the hospital operator.  08/14/2019, 5:05 PM

## 2019-08-15 DIAGNOSIS — R627 Adult failure to thrive: Secondary | ICD-10-CM

## 2019-08-15 DIAGNOSIS — Z515 Encounter for palliative care: Secondary | ICD-10-CM

## 2019-08-15 LAB — BASIC METABOLIC PANEL
Anion gap: 11 (ref 5–15)
BUN: 38 mg/dL — ABNORMAL HIGH (ref 8–23)
CO2: 24 mmol/L (ref 22–32)
Calcium: 10.5 mg/dL — ABNORMAL HIGH (ref 8.9–10.3)
Chloride: 111 mmol/L (ref 98–111)
Creatinine, Ser: 1.37 mg/dL — ABNORMAL HIGH (ref 0.44–1.00)
GFR calc Af Amer: 40 mL/min — ABNORMAL LOW (ref >60)
GFR calc non Af Amer: 34 mL/min — ABNORMAL LOW (ref >60)
Glucose, Bld: 73 mg/dL (ref 70–99)
Potassium: 3 mmol/L — ABNORMAL LOW (ref 3.5–5.1)
Sodium: 146 mmol/L — ABNORMAL HIGH (ref 135–145)

## 2019-08-15 LAB — CBC
HCT: 32.9 % — ABNORMAL LOW (ref 36.0–46.0)
Hemoglobin: 10.4 g/dL — ABNORMAL LOW (ref 12.0–15.0)
MCH: 28.9 pg (ref 26.0–34.0)
MCHC: 31.6 g/dL (ref 30.0–36.0)
MCV: 91.4 fL (ref 80.0–100.0)
Platelets: 299 10*3/uL (ref 150–400)
RBC: 3.6 MIL/uL — ABNORMAL LOW (ref 3.87–5.11)
RDW: 14.5 % (ref 11.5–15.5)
WBC: 6.7 10*3/uL (ref 4.0–10.5)
nRBC: 0 % (ref 0.0–0.2)

## 2019-08-15 MED ORDER — POTASSIUM CHLORIDE CRYS ER 20 MEQ PO TBCR
40.0000 meq | EXTENDED_RELEASE_TABLET | Freq: Every day | ORAL | Status: DC
  Administered 2019-08-15 – 2019-08-18 (×4): 40 meq via ORAL
  Filled 2019-08-15 (×4): qty 2

## 2019-08-15 NOTE — Progress Notes (Signed)
PROGRESS NOTE    Taylor Roth  WJX:[id number] DOB: Oct 08, 1928 DOA: 08/12/2019 PCP: Gayland Curry, DO  Brief Narrative:  19 y [name]-[been there ~3 years] COPD Bipolar Advanced dementia Rx at facility possible aspiration pneumonia-swallow eval 6/10 showed aspiration as an outpatient-looks like attempts at volume repletion treatment of hypercalcemia and hypernatremia were ongoing at facility Finally admitted 08/13/2019 with worsening labs and worsening mental state Sodium 153 BUN/creatinine 38/1.5 Calcium 13.2 GFR down to 29 with a baseline of 50  Assessment & Plan:   Principal Problem:   Acute renal failure superimposed on stage 3a chronic kidney disease ([place]) Active Problems:   COPD (chronic obstructive pulmonary disease) ([place])   Adult failure to thrive   Bipolar disease, chronic ([place])   HTN (hypertension)   Advanced dementia ([place])   Chronic generalized pain   Hypernatremia   Hypercalcemia   1. Volume depletion, severe hypernatremia a. Improved b. Correcting with D5 40 cc/h slowly improving expect deficit will be corrected by 7/4 2. Hypokalemia a. Replace with K. Dur 40x1 monitor trends in a.m. 3. Hypercalcemia a. Note that vitamin D levels were performed in the outpatient setting b. PTH level was 9 calcium total was 11.7-the usual range for PTH is 8.6-10.2 c. I suspect this is primarily dehydration driven d. Calcium continues to improve with rehydration-no work-up inpatient 4. Advanced failure to thrive a. Patient has advanced malnutrition evidenced by a weight of 32 kg and a BMI of 14 b. Palliative care to discuss with family 5. Advanced dementia a. Daughter relates to me that she occasionally remembers things fairly well b. I think that there is some element of geriatric depression in addition to her dementia c. This admission stopped Remero-cut back her queitipaine form 125-->100 6. Aspiration with recent treatment of aspiration  pneumonia a. Periodic checks 7. Bipolar a. See above discussion  DVT prophylaxis:  Code Status: DNR Family Communication: Long discussion with daughter 7/2 on telephone [phone] Palliative care to follow-up Disposition:   Status is: Inpatient  Remains inpatient appropriate because:Persistent severe electrolyte disturbances   Dispo: The patient is from: SNF              Anticipated d/c is to: SNF              Anticipated d/c date is: 3 days              Patient currently is not medically stable to d/c.       Consultants:   Palliative care  Procedures: None  Antimicrobials: None   Subjective:  Slightly more coherent Has not eaten breakfast Remains however somewhat combative Grips my hand hard   Objective: Vitals:   08/14/19 2122 08/15/19 0500 08/15/19 0615 08/15/19 1349  BP: (!) 172/82  (!) 142/73 (!) 142/72  Pulse: (!) 50  (!) 48 (!) 52  Resp: 18  16 20   Temp: (!) 97.4 F (36.3 C)  (!) 97.5 F (36.4 C) 98.1 F (36.7 C)  TempSrc: Oral  Oral Oral  SpO2: 98%  98% 100%  Weight:  32.7 kg    Height:        Intake/Output Summary (Last 24 hours) at 08/15/2019 1438 Last data filed at 08/15/2019 1321 Gross per 24 hour  Intake 1000.9 ml  Output --  Net 1000.9 ml   Filed Weights   08/13/19 0325 08/14/19 0659 08/15/19 0500  Weight: 33.3 kg 32.8 kg 32.7 kg    Examination:  General exam: No distress but  not really communicative Respiratory system: Clear no added sound no rales no rhonchi Cardiovascular system: S1-S2 no murmur sinus Gastrointestinal system: Soft no rebound. Central nervous system: Cannot assess completely-guards and closes her eyelids tightly Extremities: Some mild contractures at knees Skin: She has areas of healed breakdown over her lower spine Psychiatry: Confused  Data Reviewed: I have personally reviewed following labs and imaging studies Sodium 146 Potassium 3.0 BUN/creatinine 38/1.3  Radiology Studies: No results  found.   Scheduled Meds: . citalopram  10 mg Oral Daily  . dorzolamide-timolol  1 drop Both Eyes BID  . heparin  5,000 Units Subcutaneous Q8H  . latanoprost  1 drop Both Eyes QHS  . lidocaine  1 patch Transdermal Q24H  . potassium chloride  40 mEq Oral Daily  . QUEtiapine  100 mg Oral QHS  . sodium chloride flush  10-40 mL Intracatheter Q12H  . sodium chloride flush  3 mL Intravenous Q12H   Continuous Infusions: . dextrose 5% lactated ringers 40 mL/hr at 08/14/19 1755     LOS: 2 days    Time spent: 25  Nita Sells, MD [name] To contact the attending provider between 7A-7P or the covering provider during after hours 7P-7A, please log into the web site [email or web address] and access using universal [place] password for that web site. If you do not have the password, please call the hospital operator.  08/15/2019, 2:38 PM

## 2019-08-15 NOTE — Progress Notes (Signed)
Daily Progress Note   Patient Name: Taylor Roth       Date: 08/15/2019 DOB: [birth year]  Age: 86 y.o. MRN#: [id number] Attending Physician: Nita Sells, MD Primary Care Physician: Gayland Curry, DO Admit Date: 08/12/2019  Reason for Consultation/Follow-up: To discuss complex medical decision making related to patient's goals of care  Examined patient at bedside.  She keeps her eyes close and does not respond to me other than to remove my hand when I attempt to examine her.  Subjective: I spoke on the phone with her daughter Maricela Curet who lives in [place].  Ms. Kendall Flack tells me that she is the sole remaining child and therefore our surrogate decision maker.  Her mother has had some difficulty with recurrent dehydration recently.  The patient's sister and daughter have taken strides to attempt to keep her hydrated, but she continues to have difficulty.   We discussed the cycle of dehydration - hospitalization - dehydration that I fear Ms. Rosch has entered.   Ms. Kendall Flack understands that if her mother does start to eat/drink better it is appropriate for her to return to SNF with Hospice services.  We discussed the additional TLC Hospice could provide as well as the Hospice philosophy of receiving treatment at SNF rather than returning to the hospital.   We then discussed the worse case scenario - if her mother's PO intake does not improve she would be eligible for Hospice facility.  I explained prognosis at a Hospice facility is usually days to weeks and the expectation is that she would have a peaceful comfortable death.    Ms. Kendall Flack understood.  She looks forward to hearing from Dr. Verlon Au tomorrow about her mother's status.  PMT will follow up as well.  Assessment: Not  currently taking enough PO to sustain herself or maintain blood glucose.  Will follow closely for the next couple of days.  Patient Profile/HPI:  86 y.o. female  with past medical history of COPD, blindness, anemia, constipation, Bipolar, advanced dementia, aspiration PNA, hypernatremia, and hypercalcemia admitted on 08/12/2019 with dehydration, hypercalcemia, FTT, and advanced dementia.  She is a long term care resident at [facility] and is followed in the community by Dr. Mariea Clonts with [facility].  She has a long history of dementia with psychosis and delusions.  She is at risk for aspiration with schatzki's ring and esophageal dysmotility.  Chart review reveals that she has known history of hypercalcemia and had 10 lb weight loss from Feb to March and was found to have hypercalcemia at that time.  Recommendation per Dr. [name] was to forgo workup for cancer due to age and dementia with severely compromised functional status.  She was being seen at facility and treated for suspected PNA with rocephin and fluid support.  She was then transferred to the ED for further evaluation.  Palliative consulted for [place].  Length of Stay: 2   Vital Signs: BP (!) 142/72 (BP Location: Right Arm)   Pulse (!) 52   Temp 98.1 F (36.7 C) (Oral)   Resp 20   Ht 4\' 11"  (1.499 m)   Wt 32.7 kg   LMP  (LMP Unknown)   SpO2 100%   BMI 14.56 kg/m  SpO2: SpO2: 100 % O2 Device: O2 Device: Room Air O2 Flow Rate:         Palliative Assessment/Data: 20%     Palliative Care Plan    Recommendations/Plan:  Close monitoring of PO intake.   Full liquid diet.  This is what patient prefers  Family ok with returning to SNF with Hospice support if she begins eating/drinking   Will have further discussions if patient does not take in enough to sustain herself.  Code Status:  DNR  Prognosis:   with current level of PO intake - weeks at best  Discharge Planning:  To Be Determined  Care plan was  discussed with daughter.  Thank you for allowing the Palliative Medicine Team to assist in the care of this patient.  Total time spent:  35 min.     Greater than 50%  of this time was spent counseling and coordinating care related to the above assessment and plan.  Florentina Jenny, PA-C Palliative Medicine  Please contact Palliative MedicineTeam phone at [phone] for questions and concerns between 7 am - 7 pm.   Please see AMION for individual provider pager numbers.

## 2019-08-15 NOTE — Plan of Care (Signed)
Patient slept most of shift, will awaken when staff goes in to eat and drink.  Patient ate multiple containers of applesauce, juice, and ice cream this shift.  Refused foods of any real consistency such as potatoes or cream of wheat, as soon as these were placed in patients mouth she would immediately start spitting them back out.  Patient answered yes or no questions such as if she liked certain foods.

## 2019-08-15 NOTE — Consult Note (Addendum)
Consultation Note Date: 08/15/2019   Patient Name: Taylor Roth  DOB: [birth year]  MRN: [id number]  Age / Sex: 86 y.o., female  PCP: Gayland Curry, DO Referring Physician: Nita Sells, MD  Reason for Consultation: Establishing goals of care  HPI/Patient Profile: 86 y.o. female  with past medical history of COPD, blindness, anemia, constipation, Bipolar, advanced dementia, aspiration PNA, hypernatremia, and hypercalcemia admitted on 08/12/2019 with dehydration, hypercalcemia, FTT, and advanced dementia.  She is a long term care resident at [facility] and is followed in the community by Dr. Mariea Clonts with [facility].  She has a long history of dementia with psychosis and delusions.  She is at risk for aspiration with schatzki's ring and esophageal dysmotility.  Chart review reveals that she has known history of hypercalcemia and had 10 lb weight loss from Feb to March and was found to have hypercalcemia at that time.  Recommendation per Dr. [name] was to forgo workup for cancer due to age and dementia with severely compromised functional status.  She was being seen at facility and treated for suspected PNA with rocephin and fluid support.  She was then transferred to the ED for further evaluation.  Palliative consulted for [place].  Clinical Assessment and Goals of Care: Palliative care consult received.  Chart reviewed extensively  including personal review of pertinent labs and imaging.    I saw and examined Taylor Roth today.  She is lying in bed in no distress with eyes closed.  She does not really interact with me.  Attempted to call family to discuss, but no answer.  I left a voicemail and will attempt to reach out again tomorrow.  SUMMARY OF RECOMMENDATIONS   - DNR/DNI- Copy of this is under ACP in the chart. - While I do not see formal MOST for completed, notes from Dr. Mariea Clonts indicate  family desire for care plan as follows (per note from 07/02/19) : Staff member spoke with resident's daughter again recently about goals of care and the following conclusions were made then:  DNR remains, yes to hospitalization, yes to IVFs, yes to abx, no to surgery, no to tube feeding, yes to labs.    - Call placed and left voicemail for patient's daughter.  Will attempt to reach out again tomorrow.  I certainly agree with need to further delineate goals of care in light of her continued decline.  If she reaches point of discharge prior to our team getting in touch with daughter, I believe that she would be well served by outpatient palliative care follow-up at facility or follow-up with Dr. Mariea Clonts to specifically discuss [place] as Dr Mariea Clonts indicated plan for meeting with daughter in near future to discuss overall disease picture with her advanced dementia.      Code Status/Advance Care Planning:  DNR- copy in Epic  Psycho-social/Spiritual:   Desire for further Chaplaincy support:no  Additional Recommendations: Education on Hospice  Prognosis:   < 6 months  Discharge Planning: To Be Determined- Back to SNF with  palliative to follow most likely.  Would also be well served by hospice at facility if family in agreement.     Primary Diagnoses: Present on Admission: . Advanced dementia ([place]) . Adult failure to thrive . Bipolar disease, chronic ([place]) . Chronic generalized pain . COPD (chronic obstructive pulmonary disease) ([place]) . HTN (hypertension) . Hypernatremia . Hypercalcemia   I have reviewed the medical record, interviewed the patient and family, and examined the patient. The following aspects are pertinent.  Past Medical History:  Diagnosis Date  . Anxiety   . Arterial tortuosity (aorta) 12/11/2012  . Asthma   . Chronic kidney disease, stage IV (severe) ([place]) 12/11/2012  . Chronic mental illness   . CKD (chronic kidney disease), stage III 01/14/2015  . Closed hip fracture  requiring operative repair, right, with routine healing, subsequent encounter 12/19/2015  . Colitis   . COPD (chronic obstructive pulmonary disease) ([place])   . Decreased rectal sphincter tone 11/21/2012  . Depression   . Drug-seeking behavior 02/26/2011   Use of multiple md to obtain benzos/narcotics pt must sign contract to receive controlled meds.   Marland Kitchen Dysphagia 01/11/2015  . GERD (gastroesophageal reflux disease)   . Glaucoma 06/26/2011  . Glaucoma of both eyes   . Hearing loss, sensorineural 03/04/2014   [facility]; 02/26/14. There is not a medical or surgical treatment that would benefit her type of hearing loss. We discussed amplification in an overview fashion.    Marland Kitchen History of fall   . Insomnia, unspecified   . Macular degeneration 06/26/2011  . Migraines   . Nasal bone fracture 03/30/2014  . Osteoporosis   . Osteoporosis 02/26/2011  . Presence of right artificial hip joint   . Protein-calorie malnutrition, severe ([place]) 12/10/2012  . S/P ORIF (open reduction internal fixation) fracture 01/08/2016  . Schatzki's ring 01/14/2015  . Scoliosis   . Substance abuse ([place])    Benbzos and Narcotics, she does not get any of those medicines from [facility], we have empathetically told her that.  . Vertigo   . Vitamin B deficiency   . Vitamin D deficiency 08/25/2016  . Weight loss    Social History   Socioeconomic History  . Marital status: Divorced    Spouse name: Not on file  . Number of children: Not on file  . Years of education: Not on file  . Highest education level: Not on file  Occupational History  . Occupation: retired Scientist, clinical (histocompatibility and immunogenetics)  . Smoking status: Former Smoker    Packs/day: 0.50    Types: Cigarettes  . Smokeless tobacco: Never Used  Vaping Use  . Vaping Use: Never used  Substance and Sexual Activity  . Alcohol use: No  . Drug use: No  . Sexual activity: Never  Other Topics Concern  . Not on file  Social History Narrative   Admitted to [facility]  12/15/2015   Divorced   Former Smoker   Alcohol none   DNR   Social Determinants of Radio broadcast assistant Strain:   . Difficulty of Paying Living Expenses:   Food Insecurity:   . Worried About Charity fundraiser in the Last Year:   . Arboriculturist in the Last Year:   Transportation Needs:   . Film/video editor (Medical):   Marland Kitchen Lack of Transportation (Non-Medical):   Physical Activity:   . Days of Exercise per Week:   . Minutes of Exercise per Session:   Stress:   .  Feeling of Stress :   Social Connections:   . Frequency of Communication with Friends and Family:   . Frequency of Social Gatherings with Friends and Family:   . Attends Religious Services:   . Active Member of Clubs or Organizations:   . Attends Archivist Meetings:   Marland Kitchen Marital Status:    Family History  Problem Relation Age of Onset  . Stroke Mother   . Diabetes Mother   . Heart disease Mother   . Hearing loss Mother   . Cancer Father 77       esophageal   Scheduled Meds: . citalopram  10 mg Oral Daily  . dorzolamide-timolol  1 drop Both Eyes BID  . heparin  5,000 Units Subcutaneous Q8H  . latanoprost  1 drop Both Eyes QHS  . lidocaine  1 patch Transdermal Q24H  . QUEtiapine  100 mg Oral QHS  . sodium chloride flush  10-40 mL Intracatheter Q12H  . sodium chloride flush  3 mL Intravenous Q12H   Continuous Infusions: . dextrose 5% lactated ringers 40 mL/hr at 08/14/19 1755   PRN Meds:.albuterol, labetalol, ondansetron **OR** [DISCONTINUED] ondansetron (ZOFRAN) IV, Resource ThickenUp Clear, sodium chloride flush Medications Prior to Admission:  Prior to Admission medications   Medication Sig Start Date End Date Taking? Authorizing Provider  acetaminophen (TYLENOL) 500 MG tablet Give 2 tablets ( 1,000 mg total ) by mouth nightly for pain   Yes [provider]  albuterol (PROVENTIL HFA;VENTOLIN HFA) 108 (90 BASE) MCG/ACT inhaler Inhale 1 puff into the lungs every 6 (six)  hours as needed for shortness of breath.    Yes [provider]  aspirin 81 MG chewable tablet Chew 81 mg by mouth daily.   Yes [provider]  Biotin 5000 MCG CAPS Take 1 capsule by mouth daily.    Yes [provider]  citalopram (CELEXA) 10 MG tablet Take 10 mg by mouth daily.   Yes [provider]  [place] 450 MG TABS Take 1 tablet by mouth 2 (two) times daily.    Yes [provider]  docusate sodium (COLACE) 100 MG capsule Take 100 mg by mouth 2 (two) times daily.   Yes [provider]  ferrous sulfate 325 (65 FE) MG tablet Take 325 mg by mouth daily.    Yes [provider]  fludrocortisone (FLORINEF) 0.1 MG tablet Take 0.1 mg by mouth every morning. for orthostatic hypotension   Yes [provider]  lactulose (CHRONULAC) 10 GM/15ML solution Take 15 mLs (10 g total) by mouth daily as needed for severe constipation. 08/12/15  Yes [name], MD  lidocaine (LIDODERM) 5 % APPLY 1 PATCH TO ANT. LEFT THIGH DAILY LEAVE ON X 12 HOURS THEN REMOVE 06/18/19  Yes [provider]  loratadine (CLARITIN) 10 MG tablet Take 10 mg by mouth daily.    Yes [provider]  magnesium hydroxide (MILK OF MAGNESIA) 400 MG/5ML suspension If no BM in 3 days, give 30 cc Milk of Magnesium p.o. x 1 dose in 24 hours as needed (Do not use standing constipation orders for residents with renal failure CFR less than 30. Contact MD for orders) (Physician Order)   Yes [provider]  mirtazapine (REMERON) 15 MG tablet Take 7.5 mg by mouth at bedtime.   Yes [provider]  nitroGLYCERIN (NITROSTAT) 0.4 MG SL tablet Place 0.4 mg under the tongue every 5 (five) minutes as needed for chest pain. May use 3 times  Yes [provider]  NON FORMULARY Diet: Pureed Diet, Honey Thickened Liquids - to be given by spoon.   Yes [provider]  Polyethyl Glycol-Propyl Glycol (SYSTANE) 0.4-0.3 % SOLN Place 2 drops  into both eyes every 2 (two) hours as needed (dry eyes).    Yes [provider]  polyethylene glycol (MIRALAX / GLYCOLAX) packet Take 17 g by mouth daily.    Yes [provider]  potassium chloride (KLOR-CON) 10 MEQ tablet Take 20 mEq by mouth daily. 08/09/19  Yes [provider]  QUEtiapine (SEROQUEL) 100 MG tablet Take 100 mg by mouth at bedtime. Take along with a 25 mg tablet to = 125 mg qhs   Yes [provider]  QUEtiapine (SEROQUEL) 25 MG tablet Take 25 mg by mouth at bedtime. Take along with a 100 mg tablet to = 125 mg qhs   Yes [provider]  senna (SENOKOT) 8.6 MG tablet Take 2 tablets by mouth 2 (two) times daily as needed for constipation.   Yes [provider]  sodium chloride 0.45 % solution INFUSE 2 LITERS @@75ML /HR VIA MIDLINE FOR HYDRATION 08/05/19  Yes [provider]  Sodium Phosphates (RA SALINE ENEMA RE) If not relieved by Biscodyl suppository, give disposable Saline Enema rectally X 1 dose/24 hrs as needed (Do not use constipation standing orders for residents with renal failure/CFR less than 30. Contact MD for orders)(Physician Or   Yes [provider]  Cholecalciferol (VITAMIN D3) 1.25 MG (50000 UT) CAPS Take 1 capsule by mouth once a week.    [provider]  dorzolamide-timolol (COSOPT) 22.3-6.8 MG/ML ophthalmic solution Place 1 drop into both eyes 2 (two) times daily.    [provider]  ENSURE (ENSURE) Take 237 mLs by mouth 4 (four) times daily. D/t weight loss    [provider]  latanoprost (XALATAN) 0.005 % ophthalmic solution Place 1 drop into both eyes at bedtime. 06/26/19   [provider]   Allergies  Allergen Reactions  . Amoxicillin     Per daughter  . Ampicillin     Per daughter  . Depakote [Valproic Acid]     DOES NOT WANT HER TO TAKE IT==Makes her hair fall out= per daughter who is poa  . Macrobid [Nitrofurantoin Macrocrystal] Rash    Per MAR  .  Penicillins     Per daughter  . Acetaminophen Other (See Comments)    jaundice  . Azithromycin Other (See Comments)    Per MAR  . Butalbital   . Caffeine   . Chlorhexidine     Patient does not have central line or foley catheter  . Doxycycline Other (See Comments)    Per MAR  . Escitalopram Oxalate Other (See Comments)    Per MAR  . Fioricet [Butalbital-Apap-Caffeine] Other (See Comments)    Drunk. Per MAR  . Latex Other (See Comments)    Per MAR  . Levofloxacin Other (See Comments)    Per MAR  . Levofloxacin   . Nsaids Other (See Comments)    This is not an allergy. The patient was told by Dr. Rockne Menghini to avoid NSAIDs and stop Vicoprofen in to 2014 to avoid nephrotoxicity.  Per MAR.  Marland Kitchen Oxycodone Other (See Comments)    reaction to synthetic codeine Per MAR  . Restasis [Cyclosporine] Other (See Comments)    Per MAR  . Sulfa Antibiotics Other (See Comments)    Per MAR  . Biaxin [Clarithromycin] Rash    With burning  sensation Per MAR   Review of Systems  Unable to obtain  Physical Exam  General: Frail and chronically ill appearing.  Eyes closed.  Minimally interactive  HEENT: No bruits, no goiter, no JVD Heart: Regular rate and rhythm. Lungs: Fair air movement, clear Abdomen: Soft, nontender, nondistended, positive bowel sounds.  Ext: No significant edema. Cachexia noted Skin: Warm and dry  Vital Signs: BP (!) 142/73 (BP Location: Right Leg)   Pulse (!) 48   Temp (!) 97.5 F (36.4 C) (Oral)   Resp 16   Ht 4\' 11"  (1.499 m)   Wt 32.7 kg   LMP  (LMP Unknown)   SpO2 98%   BMI 14.56 kg/m  Pain Scale: Faces   Pain Score: 0-No pain   SpO2: SpO2: 98 % O2 Device:SpO2: 98 % O2 Flow Rate: .   IO: Intake/output summary:   Intake/Output Summary (Last 24 hours) at 08/15/2019 4696 Last data filed at 08/15/2019 0600 Gross per 24 hour  Intake 520.9 ml  Output --  Net 520.9 ml    LBM: Last BM Date: 08/13/19 Baseline Weight: Weight: 36.1 kg Most recent weight:  Weight: 32.7 kg     Palliative Assessment/Data:   Flowsheet Rows     Most Recent Value  Intake Tab  Referral Department Hospitalist  Unit at Time of Referral Med/Surg Unit  Palliative Care Primary Diagnosis Neurology  Date Notified 08/13/19  Palliative Care Type New Palliative care  Reason for referral Clarify Goals of Care  Date of Admission 08/12/19  Date first seen by Palliative Care 08/14/19  # of days Palliative referral response time 1 Day(s)  # of days IP prior to Palliative referral 1  Clinical Assessment  Palliative Performance Scale Score 30%  Psychosocial & Spiritual Assessment  Palliative Care Outcomes  Patient/Family meeting held? No      Time In: 1850 Time Out: 1935 Time Total: 45 minutes Greater than 50%  of this time was spent counseling and coordinating care related to the above assessment and plan.  Signed by: Micheline Rough, MD   Please contact Palliative Medicine Team phone at [phone] for questions and concerns.  For individual provider: See Shea Evans

## 2019-08-16 DIAGNOSIS — N170 Acute kidney failure with tubular necrosis: Secondary | ICD-10-CM

## 2019-08-16 DIAGNOSIS — Z7189 Other specified counseling: Secondary | ICD-10-CM

## 2019-08-16 LAB — BASIC METABOLIC PANEL
Anion gap: 8 (ref 5–15)
BUN: 23 mg/dL (ref 8–23)
CO2: 27 mmol/L (ref 22–32)
Calcium: 10.3 mg/dL (ref 8.9–10.3)
Chloride: 110 mmol/L (ref 98–111)
Creatinine, Ser: 1.11 mg/dL — ABNORMAL HIGH (ref 0.44–1.00)
GFR calc Af Amer: 51 mL/min — ABNORMAL LOW (ref >60)
GFR calc non Af Amer: 44 mL/min — ABNORMAL LOW (ref >60)
Glucose, Bld: 106 mg/dL — ABNORMAL HIGH (ref 70–99)
Potassium: 3.2 mmol/L — ABNORMAL LOW (ref 3.5–5.1)
Sodium: 145 mmol/L (ref 135–145)

## 2019-08-16 LAB — CBC
HCT: 34.5 % — ABNORMAL LOW (ref 36.0–46.0)
Hemoglobin: 10.8 g/dL — ABNORMAL LOW (ref 12.0–15.0)
MCH: 29 pg (ref 26.0–34.0)
MCHC: 31.3 g/dL (ref 30.0–36.0)
MCV: 92.7 fL (ref 80.0–100.0)
Platelets: 264 10*3/uL (ref 150–400)
RBC: 3.72 MIL/uL — ABNORMAL LOW (ref 3.87–5.11)
RDW: 14.4 % (ref 11.5–15.5)
WBC: 6.9 10*3/uL (ref 4.0–10.5)
nRBC: 0 % (ref 0.0–0.2)

## 2019-08-16 MED ORDER — DEXTROSE IN LACTATED RINGERS 5 % IV SOLN: INTRAVENOUS | Status: AC

## 2019-08-16 NOTE — Progress Notes (Signed)
PROGRESS NOTE    Taylor Roth  JHE:[id number] DOB: April 23, 1928 DOA: 08/12/2019 PCP: Gayland Curry, DO  Brief Narrative:  72 [name]-[been there ~3 years] COPD Bipolar Advanced dementia Rx at facility possible aspiration pneumonia-swallow eval 6/10 showed aspiration as an outpatient-looks like attempts at volume repletion treatment of hypercalcemia and hypernatremia were ongoing at facility Finally admitted 08/13/2019 with worsening labs and worsening mental state Sodium 153 BUN/creatinine 38/1.5 Calcium 13.2 GFR down to 29 with a baseline of 50  Assessment & Plan:   Principal Problem:   Acute renal failure superimposed on stage 3a chronic kidney disease ([place]) Active Problems:   COPD (chronic obstructive pulmonary disease) ([place])   Adult failure to thrive   Bipolar disease, chronic ([place])   HTN (hypertension)   Advanced dementia ([place])   Chronic generalized pain   Hypernatremia   Hypercalcemia   Palliative care encounter   Encounter for hospice care discussion   1. Volume depletion, severe hypernatremia a. Will d/c IVF tomorrow am and see how sodium/AKI respond b. She is much improved 2. Hypokalemia a. Replace again with Kdur but bid b. Labs am 3. Hypercalcemia a. Note that vitamin D levels were performed in the outpatient setting b. PTH level was 9 calcium total was 11.7-the usual range for PTH is 8.6-10.2 c. Normalizing d. Rpt Albumin am to get corrected cal 4. Advanced failure to thrive a. Patient has advanced malnutrition evidenced by a weight of 32 kg and a BMI of 14 b. See above 5. Advanced dementia a. Daughter relates to me that she occasionally remembers things fairly well b. I think that there is some element of geriatric depression in addition to her dementia c. This admission stopped Remero-cut back her queitipaine form 125-->100 6. Aspiration with recent treatment of aspiration pneumonia a. Periodic checks 7. Bipolar a. See above  discussion  DVT prophylaxis:  Code Status: DNR Family Communication: [place] VM with [name] daughter 7/4 on telephone [phone] Palliative care to follow-up Disposition:   Status is: Inpatient  Remains inpatient appropriate because:Persistent severe electrolyte disturbances   Dispo: The patient is from: SNF              Anticipated d/c is to: SNF              Anticipated d/c date is: 3 days              Patient currently is not medically stable to d/c.       Consultants:   Palliative care  Procedures: None  Antimicrobials: None   Subjective:  alert pleasant ate Looks much better  Objective: Vitals:   08/15/19 2002 08/16/19 0400 08/16/19 0500 08/16/19 1302  BP: (!) 108/52 125/67  (!) 141/84  Pulse: (!) 57 (!) 56  (!) 54  Resp:  20  16  Temp: 98.1 F (36.7 C) 98.7 F (37.1 C)  (!) 97.4 F (36.3 C)  TempSrc: Oral Oral  Oral  SpO2: 99% 100%  98%  Weight:   35.6 kg   Height:        Intake/Output Summary (Last 24 hours) at 08/16/2019 1751 Last data filed at 08/16/2019 1731 Gross per 24 hour  Intake 1740.02 ml  Output --  Net 1740.02 ml   Filed Weights   08/14/19 0659 08/15/19 0500 08/16/19 0500  Weight: 32.8 kg 32.7 kg 35.6 kg    Examination:  General exam: intermittently communbicates Respiratory system: Clear no added sound no rales no rhonchi Cardiovascular system:  S1-S2 no murmur sinus Gastrointestinal system: Soft no rebound. Central nervous system: moving limbs equally-some contractures limit this however Extremities: Some mild contractures at knees Skin: She has areas of healed breakdown over her lower spine Psychiatry: Confused  Data Reviewed: I have personally reviewed following labs and imaging studies Sodium 146-->145 Potassium 3.0-->3.2 BUN/creatinine 38/1.3--->23/1.11  Radiology Studies: No results found.   Scheduled Meds: . citalopram  10 mg Oral Daily  . dorzolamide-timolol  1 drop Both Eyes BID  . heparin  5,000 Units  Subcutaneous Q8H  . latanoprost  1 drop Both Eyes QHS  . lidocaine  1 patch Transdermal Q24H  . potassium chloride  40 mEq Oral Daily  . QUEtiapine  100 mg Oral QHS  . sodium chloride flush  10-40 mL Intracatheter Q12H  . sodium chloride flush  3 mL Intravenous Q12H   Continuous Infusions: . dextrose 5% lactated ringers 40 mL/hr at 08/15/19 1841     LOS: 3 days    Time spent: 15  Nita Sells, MD [name] To contact the attending provider between 7A-7P or the covering provider during after hours 7P-7A, please log into the web site [email or web address] and access using universal [place] password for that web site. If you do not have the password, please call the hospital operator.  08/16/2019, 5:51 PM

## 2019-08-16 NOTE — Progress Notes (Signed)
Daily Progress Note   Patient Name: Taylor Roth       Date: 08/16/2019 DOB: 1928-12-06  Age: 86 y.o. MRN#: [id number] Attending Physician: Nita Sells, MD Primary Care Physician: Gayland Curry, DO Admit Date: 08/12/2019  Reason for Consultation/Follow-up: To discuss complex medical decision making related to patient's goals of care  Subjective: Patient does not speak to me but is sitting up in the chair and appears alert.  She has eaten some applesauce, ensure and ice cream today.  She is listening to country music on the computer.  Called Taylor Roth (daughter) and explained that her mother appears better.  Labs appear improved.  Suggested she would benefit from having Hospice check in on her at SNF.  Taylor Roth is in agreement.  She asked if her mother becomes dehydrated would the SNF and Hospice attempt to give her drinks - I replied that they should and would.   I explained that she would get more attention and support with hospice involved.   Taylor Roth would like to have Hospice follow her mother at SNF.   Assessment: Blind 86 yo female with dementia and recurrent dehydration.    Patient Profile/HPI:   86 y.o.femalewith past medical history of COPD, blindness, anemia, constipation, Bipolar, advanced dementia, aspiration PNA, hypernatremia, and hypercalcemiaadmitted on 6/30/2021with dehydration, hypercalcemia, FTT, and advanced dementia.She is a long term care resident at [facility] and is followed in the community by Dr. Mariea Clonts with [facility]. She has a long history of dementia with psychosis and delusions. She is at risk for aspiration with schatzki's ring and esophageal dysmotility. Chart review reveals that she has known history of hypercalcemia and had 10 lb weight  loss from Feb to March and was found to have hypercalcemia at that time. Recommendation per Dr. [name] was to forgo workup for cancer due to age and dementia with severely compromised functional status. She was being seen at facility and treated for suspected PNA with rocephin and fluid support. She was then transferred to the ED for further evaluation. Palliative consulted for [place].    Length of Stay: 3   Vital Signs: BP (!) 141/84 (BP Location: Right Arm)   Pulse (!) 54   Temp (!) 97.4 F (36.3 C) (Oral)   Resp 16   Ht 4\' 11"  (1.499 m)  Wt 35.6 kg   LMP  (LMP Unknown)   SpO2 98%   BMI 15.85 kg/m  SpO2: SpO2: 98 % O2 Device: O2 Device: Room Air O2 Flow Rate:         Palliative Assessment/Data:  40%     Palliative Care Plan    Recommendations/Plan:  Return to SNF with Hospice Services when medically appropriate for DC.  Code Status:  DNR  Prognosis:   Weeks to months - given advanced dementia, poor po intake, and recurrent dehydration.  Discharge Planning:  [place] with Hospice  Care plan was discussed with daughter  Thank you for allowing the Palliative Medicine Team to assist in the care of this patient.  Total time spent:  25 min.     Greater than 50%  of this time was spent counseling and coordinating care related to the above assessment and plan.  Florentina Jenny, PA-C Palliative Medicine  Please contact Palliative MedicineTeam phone at [phone] for questions and concerns between 7 am - 7 pm.   Please see AMION for individual provider pager numbers.

## 2019-08-17 ENCOUNTER — Inpatient Hospital Stay (HOSPITAL_COMMUNITY): Payer: Medicare Other

## 2019-08-17 LAB — COMPREHENSIVE METABOLIC PANEL
ALT: 16 U/L (ref 0–44)
AST: 18 U/L (ref 15–41)
Albumin: 3.3 g/dL — ABNORMAL LOW (ref 3.5–5.0)
Alkaline Phosphatase: 72 U/L (ref 38–126)
Anion gap: 6 (ref 5–15)
BUN: 19 mg/dL (ref 8–23)
CO2: 29 mmol/L (ref 22–32)
Calcium: 10.8 mg/dL — ABNORMAL HIGH (ref 8.9–10.3)
Chloride: 109 mmol/L (ref 98–111)
Creatinine, Ser: 1.09 mg/dL — ABNORMAL HIGH (ref 0.44–1.00)
GFR calc Af Amer: 52 mL/min — ABNORMAL LOW (ref >60)
GFR calc non Af Amer: 45 mL/min — ABNORMAL LOW (ref >60)
Glucose, Bld: 105 mg/dL — ABNORMAL HIGH (ref 70–99)
Potassium: 3.8 mmol/L (ref 3.5–5.1)
Sodium: 144 mmol/L (ref 135–145)
Total Bilirubin: 0.5 mg/dL (ref 0.3–1.2)
Total Protein: 6.4 g/dL — ABNORMAL LOW (ref 6.5–8.1)

## 2019-08-17 LAB — CBC
HCT: 35.9 % — ABNORMAL LOW (ref 36.0–46.0)
Hemoglobin: 11 g/dL — ABNORMAL LOW (ref 12.0–15.0)
MCH: 28.6 pg (ref 26.0–34.0)
MCHC: 30.6 g/dL (ref 30.0–36.0)
MCV: 93.2 fL (ref 80.0–100.0)
Platelets: 278 10*3/uL (ref 150–400)
RBC: 3.85 MIL/uL — ABNORMAL LOW (ref 3.87–5.11)
RDW: 14.6 % (ref 11.5–15.5)
WBC: 11.9 10*3/uL — ABNORMAL HIGH (ref 4.0–10.5)
nRBC: 0 % (ref 0.0–0.2)

## 2019-08-17 MED ORDER — CLINDAMYCIN HCL 300 MG PO CAPS
300.0000 mg | ORAL_CAPSULE | Freq: Three times a day (TID) | ORAL | Status: DC
  Administered 2019-08-17 – 2019-08-18 (×3): 300 mg via ORAL
  Filled 2019-08-17 (×4): qty 1

## 2019-08-17 NOTE — Care Management Important Message (Signed)
Important Message  Patient Details IM Letter given to Gabriel Earing RN Case Manager to present to the Patient Name: THARA SEARING MRN: [id number] Date of Birth: [birth year]   Medicare Important Message Given:  Yes     Kerin Salen 08/17/2019, 10:45 AM

## 2019-08-17 NOTE — Progress Notes (Signed)
PROGRESS NOTE    Taylor Roth  HUD:[id number] DOB: [birth year] DOA: 08/12/2019 PCP: Gayland Curry, DO  Brief Narrative:  86 [name]-[been there ~3 years] COPD Bipolar Advanced dementia Rx at facility possible aspiration pneumonia-swallow eval 6/10 showed aspiration as an outpatient-looks like attempts at volume repletion treatment of hypercalcemia and hypernatremia were ongoing at facility Finally admitted 08/13/2019 with worsening labs and worsening mental state Sodium 153 BUN/creatinine 38/1.5 Calcium 13.2 GFR down to 29 with a baseline of 50  Assessment & Plan:   Principal Problem:   Acute renal failure superimposed on stage 3a chronic kidney disease ([place]) Active Problems:   COPD (chronic obstructive pulmonary disease) ([place])   Adult failure to thrive   Bipolar disease, chronic ([place])   HTN (hypertension)   Advanced dementia ([place])   Chronic generalized pain   Hypernatremia   Hypercalcemia   Palliative care encounter   Encounter for hospice care discussion   1. Bronchopneumonia right lung fields 1.5X 1.3 a. White count was elevated and patient has probably aspirated b. I will treat with Augmentin at this time c. Note that goals of care need to be sought in the outpatient setting if she does not spike a fever chills and looks as good as she did today she can go back to her facility for these discussions d. Note that earlier this month she was treated for pneumonia as well at her skilled facility 2. Volume depletion, severe hypernatremia a. Resolved DC IV fluid b. She is much improved 3. Hypokalemia a. Replaced during hospital stay with [facility] b. Potassium up to 3.8 4. Hypercalcemia a. Note that vitamin D levels were performed in the outpatient setting b. PTH level was 9 calcium total was 11.7-the usual range for PTH is 8.6-10.2 c. Corrected calcium is 11.4 and is improving with rehydration 5. Advanced failure to thrive a. Patient has advanced  malnutrition evidenced by a weight of 32 kg and a BMI of 14 b. See above 6. Advanced dementia a. Daughter relates to me that she occasionally remembers things fairly well b. I think that there is some element of geriatric depression in addition to her dementia c. This admission stopped Remero-cut back her queitipaine form 125-->100 7. Aspiration with recent treatment of aspiration pneumonia a. Periodic checks 8. Bipolar a. See above discussion  DVT prophylaxis:  Code Status: DNR Family Communication: [place] VM with [name] daughter 7/4 on telephone [phone] Likely can discharge patient to nursing home tomorrow Disposition:   Status is: Inpatient  Remains inpatient appropriate because:Persistent severe electrolyte disturbances and IV treatments appropriate due to intensity of illness or inability to take PO   Dispo: The patient is from: SNF              Anticipated d/c is to: SNF              Anticipated d/c date is: 1 day              Patient currently is not medically stable to d/c.       Consultants:   Palliative care  Procedures: None  Antimicrobials: None   Subjective:  Awake alert tolerating mainly liquids likes orange juice Conversant minimally but much improved compared to prior and on admission when she was obtunded Rest of review of systems is difficult to obtain  Objective: Vitals:   08/16/19 1302 08/16/19 2039 08/17/19 0450 08/17/19 1324  BP: (!) 141/84 (!) 160/80 (!) 150/85 (!) 158/82  Pulse: Marland Kitchen)  54 63 64 64  Resp: 16 19 18 15   Temp: (!) 97.4 F (36.3 C) 98.9 F (37.2 C) 98.2 F (36.8 C) (!) 97.5 F (36.4 C)  TempSrc: Oral Oral Oral Oral  SpO2: 98% 99% 100%   Weight:   35.1 kg   Height:        Intake/Output Summary (Last 24 hours) at 08/17/2019 1529 Last data filed at 08/17/2019 1250 Gross per 24 hour  Intake 1660.58 ml  Output --  Net 1660.58 ml   Filed Weights   08/15/19 0500 08/16/19 0500 08/17/19 0450  Weight: 32.7 kg 35.6 kg 35.1  kg    Examination:  General exam: intermittently communbicates Respiratory system: Clear no added sound no rales no rhonchi Cardiovascular system: S1-S2 no murmur sinus Gastrointestinal system: Soft no rebound. Central nervous system: moving limbs equally-some contractures limit this however Extremities: Some mild contractures at knees Skin: She has areas of healed breakdown over her lower spine Psychiatry: Confused  Data Reviewed: I have personally reviewed following labs and imaging studies Sodium currently 144 Potassium 3.8 BUN/creatinine 19/1.09 Corrected calcium calculated is 11.4 White count 11.9  Radiology Studies: DG CHEST PORT 1 VIEW  Result Date: 08/17/2019 CLINICAL DATA:  86 year old female female with history of COPD and pneumonia. EXAM: PORTABLE CHEST 1 VIEW COMPARISON:  Chest x-ray 06/04/2018. FINDINGS: New nodular density projecting over the right lung base measuring 1.5 x 1.3 cm, concerning for pulmonary nodule. Other nodular densities also suspected bilaterally. Interstitial prominence and patchy ill-defined opacities throughout the right mid to lower lung, concerning for probable bronchopneumonia. Left lung appears relatively clear. No pleural effusions. No evidence of pulmonary edema. No pneumothorax. Heart size is normal. Upper mediastinal contours are within normal limits. Aortic atherosclerosis. IMPRESSION: 1. Probable bronchopneumonia in the right mid to lower lung. 2. Multiple pulmonary nodules noted in the lungs bilaterally concerning for potential metastatic disease. Further evaluation with nonemergent chest CT is suggested in the near future to better evaluate these findings. 3. Aortic atherosclerosis. Electronically Signed   By: Vinnie Langton M.D.   On: 08/17/2019 09:10     Scheduled Meds: . citalopram  10 mg Oral Daily  . dorzolamide-timolol  1 drop Both Eyes BID  . heparin  5,000 Units Subcutaneous Q8H  . latanoprost  1 drop Both Eyes QHS  . lidocaine  1  patch Transdermal Q24H  . potassium chloride  40 mEq Oral Daily  . QUEtiapine  100 mg Oral QHS  . sodium chloride flush  10-40 mL Intracatheter Q12H  . sodium chloride flush  3 mL Intravenous Q12H   Continuous Infusions:    LOS: 4 days    Time spent: [place], MD [name] To contact the attending provider between 7A-7P or the covering provider during after hours 7P-7A, please log into the web site [email or web address] and access using universal [place] password for that web site. If you do not have the password, please call the hospital operator.  08/17/2019, 3:29 PM

## 2019-08-18 ENCOUNTER — Encounter: Payer: Self-pay | Admitting: Internal Medicine

## 2019-08-18 ENCOUNTER — Non-Acute Institutional Stay (SKILLED_NURSING_FACILITY): Payer: Medicare Other | Admitting: Internal Medicine

## 2019-08-18 DIAGNOSIS — N179 Acute kidney failure, unspecified: Secondary | ICD-10-CM | POA: Diagnosis not present

## 2019-08-18 DIAGNOSIS — F0390 Unspecified dementia without behavioral disturbance: Secondary | ICD-10-CM

## 2019-08-18 DIAGNOSIS — I771 Stricture of artery: Secondary | ICD-10-CM

## 2019-08-18 DIAGNOSIS — E870 Hyperosmolality and hypernatremia: Secondary | ICD-10-CM

## 2019-08-18 DIAGNOSIS — F22 Delusional disorders: Secondary | ICD-10-CM

## 2019-08-18 DIAGNOSIS — R52 Pain, unspecified: Secondary | ICD-10-CM

## 2019-08-18 DIAGNOSIS — N1831 Chronic kidney disease, stage 3a: Secondary | ICD-10-CM

## 2019-08-18 DIAGNOSIS — J449 Chronic obstructive pulmonary disease, unspecified: Secondary | ICD-10-CM

## 2019-08-18 DIAGNOSIS — R627 Adult failure to thrive: Secondary | ICD-10-CM

## 2019-08-18 DIAGNOSIS — E876 Hypokalemia: Secondary | ICD-10-CM | POA: Diagnosis not present

## 2019-08-18 DIAGNOSIS — J690 Pneumonitis due to inhalation of food and vomit: Secondary | ICD-10-CM

## 2019-08-18 DIAGNOSIS — G8929 Other chronic pain: Secondary | ICD-10-CM

## 2019-08-18 DIAGNOSIS — F03C0 Unspecified dementia, severe, without behavioral disturbance, psychotic disturbance, mood disturbance, and anxiety: Secondary | ICD-10-CM

## 2019-08-18 LAB — COMPREHENSIVE METABOLIC PANEL
ALT: 15 U/L (ref 0–44)
AST: 18 U/L (ref 15–41)
Albumin: 3.3 g/dL — ABNORMAL LOW (ref 3.5–5.0)
Alkaline Phosphatase: 78 U/L (ref 38–126)
Anion gap: 10 (ref 5–15)
BUN: 14 mg/dL (ref 8–23)
CO2: 26 mmol/L (ref 22–32)
Calcium: 10.4 mg/dL — ABNORMAL HIGH (ref 8.9–10.3)
Chloride: 108 mmol/L (ref 98–111)
Creatinine, Ser: 1.01 mg/dL — ABNORMAL HIGH (ref 0.44–1.00)
GFR calc Af Amer: 58 mL/min — ABNORMAL LOW (ref >60)
GFR calc non Af Amer: 50 mL/min — ABNORMAL LOW (ref >60)
Glucose, Bld: 94 mg/dL (ref 70–99)
Potassium: 4.2 mmol/L (ref 3.5–5.1)
Sodium: 144 mmol/L (ref 135–145)
Total Bilirubin: 0.3 mg/dL (ref 0.3–1.2)
Total Protein: 6.5 g/dL (ref 6.5–8.1)

## 2019-08-18 MED ORDER — CLINDAMYCIN HCL 300 MG PO CAPS: 300.0000 mg | ORAL_CAPSULE | Freq: Three times a day (TID) | ORAL | 0 refills | Status: DC

## 2019-08-18 MED ORDER — RESOURCE THICKENUP CLEAR PO POWD: 1.0000 | ORAL | Status: DC | PRN

## 2019-08-18 MED ORDER — POTASSIUM CHLORIDE CRYS ER 20 MEQ PO TBCR: 40.0000 meq | EXTENDED_RELEASE_TABLET | Freq: Every day | ORAL | Status: DC

## 2019-08-18 NOTE — Progress Notes (Signed)
Hydrologist [facility]) Hospital Liaison: RN note     Notified by Dr. Rowe Pavy of patient/family request for [facility] services at [facility] after discharge. Chart and patient information under review by [facility] physician. Hospice eligibility pending currently.     Write left voice mail messages for daughter to return call. [facility] will follow up with [facility] to coordinate initiation of hospice services.        Please call with any hospice related questions.      Thank you for this referral.     Farrel Gordon, RN, [facility] (listed on [place] under [place])   [phone]

## 2019-08-18 NOTE — Discharge Summary (Addendum)
Physician Discharge Summary  Taylor Roth SWN:[id number] DOB: 84/10/1928 DOA: 08/12/2019  PCP: Gayland Curry, DO  Admit date: 84/30/2021 Discharge date: 84/07/2019  Time spent: 50 minutes  Recommendations for Outpatient Follow-up:  1. Continue goals of care/palliative discussions in the outpatient setting if patient becomes dehydrated again or spikes a fever-has had multiple pneumonias in about 1 month signaling poor prognosis and frailty 2. Minimized medications as per [facility] for example would not give Remeron aspirin and other nutraceuticals given poor prognosis and unlikely to be able to keep up with her nutrition anyway 3. Get labs in about 1 week 4. Hopefully can transition to hospice if continues to fail to thrive  Discharge Diagnoses:  Principal Problem:   Acute renal failure superimposed on stage 3a chronic kidney disease ([place]) Active Problems:   COPD (chronic obstructive pulmonary disease) ([place])   Adult failure to thrive   Bipolar disease, chronic ([place])   HTN (hypertension)   Advanced dementia ([place])   Chronic generalized pain   Hypernatremia   Hypercalcemia   Palliative care encounter   Encounter for hospice care discussion   Discharge Condition: Guarded  Diet recommendation: Thickened, nectar thick dysphagia 1-2  Filed Weights   08/16/19 0500 08/17/19 0450 08/18/19 0640  Weight: 35.6 kg 35.1 kg 35.7 kg    History of present illness:  86 y Taylor Roth-[been there ~3 years] COPD Bipolar Advanced dementia Rx at facility possible aspiration pneumonia-swallow eval 6/10 showed aspiration as an outpatient-looks like attempts at volume repletion treatment of hypercalcemia and hypernatremia were ongoing at facility Finally admitted 08/13/2019 with worsening labs and worsening mental state Sodium 153 BUN/creatinine 38/1.5 Calcium 13.2 GFR down to 29 with a baseline of 50  Hospital Course:  1. Bronchopneumonia right lung fields 1.5X 1.3 a. White count was  elevated and patient has probably aspirated b. I has allergies to penicillin clindamycin very circumscribed course given as he was just treated earlier this month for pneumonia c. Needs outpatient continuation of goals of care 2. Volume depletion, severe hypernatremia a. Resolved DC IV fluid keep port in place on discharge b. She is much improved 3. Hypokalemia a. Replaced during hospital stay with [facility] b. Replacing potassium in the outpatient setting 4. Hypercalcemia a. Note that vitamin D levels were performed in the outpatient setting b. PTH level was 9 calcium total was 11.7-the usual range for PTH is 8.6-10.2 c. Corrected calcium is 11.4 and is improving with rehydration-would not work-up further 5. Advanced failure to thrive a. Patient has advanced malnutrition evidenced by a weight of 32 kg and a BMI of 14 b. See above 6. Advanced dementia a. Daughter relates to me that she occasionally remembers things fairly well b. This admission stopped Remero-cut back her queitipaine form 125-->100 7. Aspiration with recent treatment of aspiration pneumonia a. As above discussion very poor prognosis 8. Bipolar a. See above discussion b.  Consultations:  Palliative medicine  Discharge Exam: Vitals:   08/17/19 2133 08/18/19 0640  BP: (!) 144/92 130/75  Pulse: 65 73  Resp: 16 20  Temp: 98.5 F (36.9 C) 97.8 F (36.6 C)  SpO2: 96% 95%    General: Awakens today is able to verbalize a little bit but does not seem to want to be disturbed She can however interact with me more meaningfully is able to tell me that she is cold when I remove the covers and seems to be less listless than she was over the past several days Cardiovascular: S1-S2  no murmur rub or gallop Respiratory: Clear no added sound no rales no rhonchi No lower extremity edema Edentulous Cannot sense vision She does have areas of healed breakdown over her back and sacrum as well as her lower vertebra these are healed  over but she does need to be turned  Discharge Instructions   Discharge Instructions    Diet - low sodium heart healthy   Complete by: As directed    If the dressing is still on your incision site when you go home, remove it on the third day after your surgery date. Remove dressing if it begins to fall off, or if it is dirty or damaged before the third day.   Complete by: As directed    Increase activity slowly   Complete by: As directed      Allergies as of 08/18/2019      Reactions   Amoxicillin    Per daughter   Ampicillin    Per daughter   Depakote [valproic Acid]    DOES NOT WANT HER TO TAKE IT==Makes her hair fall out= per daughter who is poa   Macrobid [nitrofurantoin Macrocrystal] Rash   Per MAR   Penicillins    Per daughter   Acetaminophen Other (See Comments)   jaundice   Azithromycin Other (See Comments)   Per MAR   Butalbital    Caffeine    Chlorhexidine    Patient does not have central line or foley catheter   Doxycycline Other (See Comments)   Per MAR   Escitalopram Oxalate Other (See Comments)   Per MAR   Fioricet [butalbital-apap-caffeine] Other (See Comments)   Drunk. Per MAR   Latex Other (See Comments)   Per MAR   Levofloxacin Other (See Comments)   Per MAR   Levofloxacin    Nsaids Other (See Comments)   This is not an allergy. The patient was told by Dr. Rockne Menghini to avoid NSAIDs and stop Vicoprofen in to 2014 to avoid nephrotoxicity. Per MAR.   Oxycodone Other (See Comments)   reaction to synthetic codeine Per MAR   Restasis [cyclosporine] Other (See Comments)   Per MAR   Sulfa Antibiotics Other (See Comments)   Per MAR   Biaxin [clarithromycin] Rash   With burning sensation Per MAR      Medication List    STOP taking these medications   aspirin 81 MG chewable tablet   Biotin 5000 MCG Caps   Claritin 10 MG tablet Generic drug: loratadine   Cranberry 450 MG Tabs   docusate sodium 100 MG capsule Commonly known as: COLACE    ferrous sulfate 325 (65 FE) MG tablet   lactulose 10 GM/15ML solution Commonly known as: CHRONULAC   magnesium hydroxide 400 MG/5ML suspension Commonly known as: MILK OF MAGNESIA   mirtazapine 15 MG tablet Commonly known as: REMERON   nitroGLYCERIN 0.4 MG SL tablet Commonly known as: NITROSTAT   NON FORMULARY   potassium chloride 10 MEQ tablet Commonly known as: KLOR-CON   senna 8.6 MG tablet Commonly known as: SENOKOT   sodium chloride 0.45 % solution   Vitamin D3 1.25 MG (50000 UT) Caps     TAKE these medications   acetaminophen 500 MG tablet Commonly known as: TYLENOL Give 2 tablets ( 1,000 mg total ) by mouth nightly for pain   albuterol 108 (90 Base) MCG/ACT inhaler Commonly known as: VENTOLIN HFA Inhale 1 puff into the lungs every 6 (six) hours as needed for shortness of breath.  citalopram 10 MG tablet Commonly known as: CELEXA Take 10 mg by mouth daily.   clindamycin 300 MG capsule Commonly known as: CLEOCIN Take 1 capsule (300 mg total) by mouth every 8 (eight) hours.   dorzolamide-timolol 22.3-6.8 MG/ML ophthalmic solution Commonly known as: COSOPT Place 1 drop into both eyes 2 (two) times daily.   Ensure Take 237 mLs by mouth 4 (four) times daily. D/t weight loss   fludrocortisone 0.1 MG tablet Commonly known as: FLORINEF Take 0.1 mg by mouth every morning. for orthostatic hypotension   latanoprost 0.005 % ophthalmic solution Commonly known as: XALATAN Place 1 drop into both eyes at bedtime.   lidocaine 5 % Commonly known as: LIDODERM APPLY 1 PATCH TO ANT. LEFT THIGH DAILY LEAVE ON X 12 HOURS THEN REMOVE   polyethylene glycol 17 g packet Commonly known as: MIRALAX / GLYCOLAX Take 17 g by mouth daily.   potassium chloride SA 20 MEQ tablet Commonly known as: KLOR-CON Take 2 tablets (40 mEq total) by mouth daily.   QUEtiapine 100 MG tablet Commonly known as: SEROQUEL Take 100 mg by mouth at bedtime. Take along with a 25 mg tablet to  = 125 mg qhs What changed: Another medication with the same name was removed. Continue taking this medication, and follow the directions you see here.   RA SALINE ENEMA RE If not relieved by Biscodyl suppository, give disposable Saline Enema rectally X 1 dose/24 hrs as needed (Do not use constipation standing orders for residents with renal failure/CFR less than 30. Contact MD for orders)(Physician Or   Resource ThickenUp Clear Powd Take 120 g by mouth as needed.   Systane 0.4-0.3 % Soln Generic drug: Polyethyl Glycol-Propyl Glycol Place 2 drops into both eyes every 2 (two) hours as needed (dry eyes).            Discharge Care Instructions  (From admission, onward)         Start     Ordered   08/18/19 0000  If the dressing is still on your incision site when you go home, remove it on the third day after your surgery date. Remove dressing if it begins to fall off, or if it is dirty or damaged before the third day.        08/18/19 0831         Allergies  Allergen Reactions  . Amoxicillin     Per daughter  . Ampicillin     Per daughter  . Depakote [Valproic Acid]     DOES NOT WANT HER TO TAKE IT==Makes her hair fall out= per daughter who is poa  . Macrobid [Nitrofurantoin Macrocrystal] Rash    Per MAR  . Penicillins     Per daughter  . Acetaminophen Other (See Comments)    jaundice  . Azithromycin Other (See Comments)    Per MAR  . Butalbital   . Caffeine   . Chlorhexidine     Patient does not have central line or foley catheter  . Doxycycline Other (See Comments)    Per MAR  . Escitalopram Oxalate Other (See Comments)    Per MAR  . Fioricet [Butalbital-Apap-Caffeine] Other (See Comments)    Drunk. Per MAR  . Latex Other (See Comments)    Per MAR  . Levofloxacin Other (See Comments)    Per MAR  . Levofloxacin   . Nsaids Other (See Comments)    This is not an allergy. The patient was told by Dr. Rockne Menghini to avoid NSAIDs  and stop Vicoprofen in to 2014 to avoid  nephrotoxicity.  Per MAR.  Marland Kitchen Oxycodone Other (See Comments)    reaction to synthetic codeine Per MAR  . Restasis [Cyclosporine] Other (See Comments)    Per MAR  . Sulfa Antibiotics Other (See Comments)    Per MAR  . Biaxin [Clarithromycin] Rash    With burning sensation Per MAR      The results of significant diagnostics from this hospitalization (including imaging, microbiology, ancillary and laboratory) are listed below for reference.    Significant Diagnostic Studies: DG CHEST PORT 1 VIEW  Result Date: 08/17/2019 CLINICAL DATA:  86 year old female with history of COPD and pneumonia. EXAM: PORTABLE CHEST 1 VIEW COMPARISON:  Chest x-ray 06/04/2018. FINDINGS: New nodular density projecting over the right lung base measuring 1.5 x 1.3 cm, concerning for pulmonary nodule. Other nodular densities also suspected bilaterally. Interstitial prominence and patchy ill-defined opacities throughout the right mid to lower lung, concerning for probable bronchopneumonia. Left lung appears relatively clear. No pleural effusions. No evidence of pulmonary edema. No pneumothorax. Heart size is normal. Upper mediastinal contours are within normal limits. Aortic atherosclerosis. IMPRESSION: 1. Probable bronchopneumonia in the right mid to lower lung. 2. Multiple pulmonary nodules noted in the lungs bilaterally concerning for potential metastatic disease. Further evaluation with nonemergent chest CT is suggested in the near future to better evaluate these findings. 3. Aortic atherosclerosis. Electronically Signed   By: Vinnie Langton M.D.   On: 08/17/2019 09:10    Microbiology: Recent Results (from the past 240 hour(s))  SARS Coronavirus 2 by RT PCR (hospital order, performed in [facility] hospital lab) Nasopharyngeal Nasopharyngeal Swab     Status: None   Collection Time: 08/13/19  1:20 AM   Specimen: Nasopharyngeal Swab  Result Value Ref Range Status   SARS Coronavirus 2 NEGATIVE NEGATIVE Final     Comment: (NOTE) SARS-CoV-2 target nucleic acids are NOT DETECTED.  The SARS-CoV-2 RNA is generally detectable in upper and lower respiratory specimens during the acute phase of infection. The lowest concentration of SARS-CoV-2 viral copies this assay can detect is 250 copies / mL. A negative result does not preclude SARS-CoV-2 infection and should not be used as the sole basis for treatment or other patient management decisions.  A negative result may occur with improper specimen collection / handling, submission of specimen other than nasopharyngeal swab, presence of viral mutation(s) within the areas targeted by this assay, and inadequate number of viral copies (<250 copies / mL). A negative result must be combined with clinical observations, patient history, and epidemiological information.  Fact Sheet for Patients:   StrictlyIdeas.no  Fact Sheet for Healthcare Providers: BankingDealers.co.za  This test is not yet approved or  cleared by the [place] FDA and has been authorized for detection and/or diagnosis of SARS-CoV-2 by FDA under an Emergency Use Authorization (EUA).  This EUA will remain in effect (meaning this test can be used) for the duration of the COVID-19 declaration under Section 564(b)(1) of the Act, 21 U.S.C. section 360bbb-3(b)(1), unless the authorization is terminated or revoked sooner.  Performed at [facility], [place] [address], [place], [place] [address]      Labs: Basic Metabolic Panel: Recent Labs  Lab 08/12/19 1944 08/13/19 5809 08/14/19 0917 08/15/19 0918 08/16/19 1601 08/17/19 0452 08/18/19 0415  NA 152*   < > 147* 146* 145 144 144  K 3.5   < > 4.0 3.0* 3.2* 3.8 4.2  CL 112*   < >  111 111 110 109 108  CO2 24   < > 26 24 27 29 26   GLUCOSE 102*   < > 162* 73 106* 105* 94  BUN 45*   < > 39* 38* 23 19 14   CREATININE 1.70*   < > 1.54* 1.37* 1.11* 1.09* 1.01*  CALCIUM 12.1*    < > 11.4* 10.5* 10.3 10.8* 10.4*  MG 2.5*  --   --   --   --   --   --   PHOS 3.3  --   --   --   --   --   --    < > = values in this interval not displayed.   Liver Function Tests: Recent Labs  Lab 08/12/19 1944 08/13/19 0829 08/14/19 0917 08/17/19 0452 08/18/19 0415  AST 17 20 21 18 18   ALT 15 16 17 16 15   ALKPHOS 83 80 77 72 78  BILITOT 0.3 0.4 0.4 0.5 0.3  PROT 7.5 7.7 7.4 6.4* 6.5  ALBUMIN 3.7 3.8 3.8 3.3* 3.3*   No results for input(s): LIPASE, AMYLASE in the last 168 hours. No results for input(s): AMMONIA in the last 168 hours. CBC: Recent Labs  Lab 08/12/19 1944 08/12/19 1944 08/13/19 0828 08/14/19 0414 08/15/19 0348 08/16/19 0646 08/17/19 0452  WBC 9.6   < > 8.6 8.7 6.7 6.9 11.9*  NEUTROABS 6.6  --   --   --   --   --   --   HGB 11.5*   < > 11.4* 11.1* 10.4* 10.8* 11.0*  HCT 37.2   < > 35.9* 34.8* 32.9* 34.5* 35.9*  MCV 93.0   < > 91.8 90.6 91.4 92.7 93.2  PLT 386   < > 386 360 299 264 278   < > = values in this interval not displayed.   Cardiac Enzymes: No results for input(s): CKTOTAL, CKMB, CKMBINDEX, TROPONINI in the last 168 hours. BNP: BNP (last 3 results) No results for input(s): BNP in the last 8760 hours.  ProBNP (last 3 results) No results for input(s): PROBNP in the last 8760 hours.  CBG: No results for input(s): GLUCAP in the last 168 hours.     Signed:  Nita Sells MD   [name] 08/18/2019, 8:31 AM

## 2019-08-18 NOTE — Progress Notes (Signed)
Provider:  Rexene Edison. Mariea Clonts, D.O., C.M.D. Location:  [facility] Room Number: 423/W Place of Service:  SNF (31)  PCP: Gayland Curry, DO Patient Care Team: Gayland Curry, DO as PCP - General (Geriatric Medicine) [name], MD (Gastroenterology) [name], NP (Obstetrics and Gynecology) [facility] ([place])  Extended Emergency Contact Information Primary Emergency Contact: [name] [place] Phone: [phone] Mobile Phone: [phone] Relation: Daughter Secondary Emergency Contact: Gwyndolyn Kaufman [place] of [place] Phone: [phone] Mobile Phone: [phone] Relation: Sister  Code Status: DNR Goals of Care: Advanced Directive information Advanced Directives 08/18/2019  Does Patient Have a Medical Advance Directive? Yes  Type of Advance Directive Out of facility DNR (pink MOST or yellow form)  Does patient want to make changes to medical advance directive? No - Patient declined  Copy of [place] in Chart? -  Would patient like information on creating a medical advance directive? -  Pre-existing out of facility DNR order (yellow form or pink MOST form) Yellow form placed in chart (order not valid for inpatient use)      Chief Complaint  Patient presents with  . Readmit To SNF    Readmission to [facility]    HPI: Patient is a 86 y.o. female seen today for readmission to [facility] s/p hospitalization 6/30-08/18/19 with acute on chronic renal failure stage 3a, COPD, failure to thrive, bipolar, dementia, htn, hypernatremia, hypercalcemia.  She has been having recurrent episodes of dehydration due to poor po intake and failing to thrive.  She's also been treated for pneumonia a few times in the past month due to aspiration.  During her hospitalization, palliative care was consulted and further goals established.   Recommendations were provided not to give supplements, remeron, asa that are life-prolonging measures.  If intake remained poor, transition to hospice planned.    During the stay, she also had right bronchopneumonia with leukocytosis and was treated with clindamycin.    She had hypernatremia which was severe due to volume depletion which was treated with IVFs with improvement, but that cannot be sustained if po intake does not improve.  Hypokalemia also was repleted.  Hypercalcemia was worked up again since labs from facility could not be abstracted into the chart.  She still does not have hyperparathyroidism.  BMI was down to 14.  She had not been getting any benefit from the remeron so it was stopped.  Her seroquel dose was reduced to 100mg  from 125mg  (likely she was lethargic when she was dehydrated so not combative with staff).  She just returned today, is a bit more alert than previously, but remains cachectic and frail.  She's in the isolation unit after her stay.  She's had both covid vaccines.  Past Medical History:  Diagnosis Date  . Anxiety   . Arterial tortuosity (aorta) 12/11/2012  . Asthma   . Chronic kidney disease, stage IV (severe) ([place]) 12/11/2012  . Chronic mental illness   . CKD (chronic kidney disease), stage III 01/14/2015  . Closed hip fracture requiring operative repair, right, with routine healing, subsequent encounter 12/19/2015  . Colitis   . COPD (chronic obstructive pulmonary disease) ([place])   . Decreased rectal sphincter tone 11/21/2012  . Depression   . Drug-seeking behavior 02/26/2011   Use of multiple md to obtain benzos/narcotics pt must sign contract to receive controlled meds.   Marland Kitchen Dysphagia 01/11/2015  .  GERD (gastroesophageal reflux disease)   . Glaucoma 06/26/2011  . Glaucoma of both eyes   . Hearing loss, sensorineural 03/04/2014   [facility]; 02/26/14. There is not a medical or surgical treatment that would benefit her type of hearing loss. We  discussed amplification in an overview fashion.    Marland Kitchen History of fall   . Insomnia, unspecified   . Macular degeneration 06/26/2011  . Migraines   . Nasal bone fracture 03/30/2014  . Osteoporosis   . Osteoporosis 02/26/2011  . Presence of right artificial hip joint   . Protein-calorie malnutrition, severe ([place]) 12/10/2012  . S/P ORIF (open reduction internal fixation) fracture 01/08/2016  . Schatzki's ring 01/14/2015  . Scoliosis   . Substance abuse ([place])    Benbzos and Narcotics, she does not get any of those medicines from [facility], we have empathetically told her that.  . Vertigo   . Vitamin B deficiency   . Vitamin D deficiency 08/25/2016  . Weight loss    Past Surgical History:  Procedure Laterality Date  . ABDOMINAL HYSTERECTOMY    . CATARACT EXTRACTION    . CHOLECYSTECTOMY    . ESOPHAGOGASTRODUODENOSCOPY (EGD) WITH PROPOFOL N/A 01/13/2015   Procedure: ESOPHAGOGASTRODUODENOSCOPY (EGD) WITH PROPOFOL;  Surgeon: [name], MD;  Location: [facility];  Service: Endoscopy;  Laterality: N/A;  . HIP ARTHROPLASTY Right 12/21/2015   Procedure: ARTHROPLASTY BIPOLAR HIP (HEMIARTHROPLASTY);  Surgeon: [name], MD;  Location: [facility];  Service: Orthopedics;  Laterality: Right;  . PARTIAL HYSTERECTOMY      Social History   Socioeconomic History  . Marital status: Divorced    Spouse name: Not on file  . Number of children: Not on file  . Years of education: Not on file  . Highest education level: Not on file  Occupational History  . Occupation: retired Scientist, clinical (histocompatibility and immunogenetics)  . Smoking status: Former Smoker    Packs/day: 0.50    Types: Cigarettes  . Smokeless tobacco: Never Used  Vaping Use  . Vaping Use: Never used  Substance and Sexual Activity  . Alcohol use: No  . Drug use: No  . Sexual activity: Never  Other Topics Concern  . Not on file  Social History Narrative   Admitted to [facility] 12/15/2015   Divorced   Former Smoker   Alcohol none   DNR   Social  Determinants of Radio broadcast assistant Strain:   . Difficulty of Paying Living Expenses:   Food Insecurity:   . Worried About Charity fundraiser in the Last Year:   . Arboriculturist in the Last Year:   Transportation Needs:   . Film/video editor (Medical):   Marland Kitchen Lack of Transportation (Non-Medical):   Physical Activity:   . Days of Exercise per Week:   . Minutes of Exercise per Session:   Stress:   . Feeling of Stress :   Social Connections:   . Frequency of Communication with Friends and Family:   . Frequency of Social Gatherings with Friends and Family:   . Attends Religious Services:   . Active Member of Clubs or Organizations:   . Attends Archivist Meetings:   Marland Kitchen Marital Status:     reports that she has quit smoking. Her smoking use included cigarettes. She smoked 0.50 packs per day. She has never used smokeless tobacco. She reports that she does not drink alcohol and does not use drugs.  Functional Status Survey:  Family History  Problem Relation Age of Onset  . Stroke Mother   . Diabetes Mother   . Heart disease Mother   . Hearing loss Mother   . Cancer Father 21       esophageal    Health Maintenance  Topic Date Due  . TETANUS/TDAP  02/13/2019  . INFLUENZA VACCINE  09/13/2019  . DEXA SCAN  Completed  . COVID-19 Vaccine  Completed  . PNA vac Low Risk Adult  Completed    Allergies  Allergen Reactions  . Amoxicillin     Per daughter  . Ampicillin     Per daughter  . Depakote [Valproic Acid]     DOES NOT WANT HER TO TAKE IT==Makes her hair fall out= per daughter who is poa  . Macrobid [Nitrofurantoin Macrocrystal] Rash    Per MAR  . Penicillins     Per daughter  . Acetaminophen Other (See Comments)    jaundice  . Azithromycin Other (See Comments)    Per MAR  . Butalbital   . Caffeine   . Chlorhexidine     Patient does not have central line or foley catheter  . Doxycycline Other (See Comments)    Per MAR  . Escitalopram  Oxalate Other (See Comments)    Per MAR  . Fioricet [Butalbital-Apap-Caffeine] Other (See Comments)    Drunk. Per MAR  . Latex Other (See Comments)    Per MAR  . Levofloxacin Other (See Comments)    Per MAR  . Levofloxacin   . Nsaids Other (See Comments)    This is not an allergy. The patient was told by Dr. Rockne Menghini to avoid NSAIDs and stop Vicoprofen in to 2014 to avoid nephrotoxicity.  Per MAR.  Marland Kitchen Oxycodone Other (See Comments)    reaction to synthetic codeine Per MAR  . Restasis [Cyclosporine] Other (See Comments)    Per MAR  . Sulfa Antibiotics Other (See Comments)    Per MAR  . Biaxin [Clarithromycin] Rash    With burning sensation Per [facility]    Outpatient Encounter Medications as of 08/18/2019  Medication Sig  . acetaminophen (TYLENOL) 500 MG tablet Give 2 tablets ( 1,000 mg total ) by mouth nightly for pain  . albuterol (PROVENTIL HFA;VENTOLIN HFA) 108 (90 BASE) MCG/ACT inhaler Inhale 1 puff into the lungs every 6 (six) hours as needed for shortness of breath.   . citalopram (CELEXA) 10 MG tablet Take 10 mg by mouth daily.  . clindamycin (CLEOCIN) 300 MG capsule Take 1 capsule (300 mg total) by mouth every 8 (eight) hours.  . dorzolamide-timolol (COSOPT) 22.3-6.8 MG/ML ophthalmic solution Place 1 drop into both eyes 2 (two) times daily.  . fludrocortisone (FLORINEF) 0.1 MG tablet Take 0.1 mg by mouth every morning. for orthostatic hypotension  . latanoprost (XALATAN) 0.005 % ophthalmic solution Place 1 drop into both eyes at bedtime.  . lidocaine (LIDODERM) 5 % APPLY 1 PATCH TO ANT. LEFT THIGH DAILY LEAVE ON X 12 HOURS THEN REMOVE  . NON FORMULARY Diet: Puree with Nectar thickened liquid  . NON FORMULARY MEDPASS :Give 4 oz by mouth qid d/t weight loss  . Polyethyl Glycol-Propyl Glycol (SYSTANE) 0.4-0.3 % SOLN Place 2 drops into both eyes every 2 (two) hours as needed (dry eyes).   . potassium chloride SA (KLOR-CON) 20 MEQ tablet Take 2 tablets (40 mEq total) by mouth daily.  .  QUEtiapine (SEROQUEL) 100 MG tablet Take 100 mg by mouth at bedtime. Take along with a 25 mg  tablet to = 125 mg qhs  . senna (SENOKOT) 8.6 MG TABS tablet Take 2 tablets by mouth daily.  . Sodium Phosphates (RA SALINE ENEMA RE) If not relieved by Biscodyl suppository, give disposable Saline Enema rectally X 1 dose/24 hrs as needed (Do not use constipation standing orders for residents with renal failure/CFR less than 30. Contact MD for orders)(Physician Or  . [DISCONTINUED] magnesium hydroxide (MILK OF MAGNESIA) 400 MG/5ML suspension If no BM in 3 days, give 30 cc Milk of Magnesium p.o. x 1 dose in 24 hours as needed (Do not use standing constipation orders for residents with renal failure CFR less than 30. Contact MD for orders) (Physician Order)  . [DISCONTINUED] QUEtiapine (SEROQUEL) 25 MG tablet Take 25 mg by mouth at bedtime. Take along with a 100 mg tablet to = 125 mg qhs  . [DISCONTINUED] aspirin 81 MG chewable tablet Chew 81 mg by mouth daily.  . [DISCONTINUED] Biotin 5000 MCG CAPS Take 1 capsule by mouth daily.   . [DISCONTINUED] Cholecalciferol (VITAMIN D3) 1.25 MG (50000 UT) CAPS Take 1 capsule by mouth once a week.  . [DISCONTINUED] Cranberry 450 MG TABS Take 1 tablet by mouth 2 (two) times daily.   . [DISCONTINUED] docusate sodium (COLACE) 100 MG capsule Take 100 mg by mouth 2 (two) times daily.  . [DISCONTINUED] ENSURE (ENSURE) Take 237 mLs by mouth 4 (four) times daily. D/t weight loss  . [DISCONTINUED] ferrous sulfate 325 (65 FE) MG tablet Take 325 mg by mouth daily.   . [DISCONTINUED] lactulose (CHRONULAC) 10 GM/15ML solution Take 15 mLs (10 g total) by mouth daily as needed for severe constipation.  . [DISCONTINUED] loratadine (CLARITIN) 10 MG tablet Take 10 mg by mouth daily.   . [DISCONTINUED] Maltodextrin-Xanthan Gum (RESOURCE THICKENUP CLEAR) POWD Take 120 g by mouth as needed.  . [DISCONTINUED] mirtazapine (REMERON) 15 MG tablet Take 7.5 mg by mouth at bedtime.  . [DISCONTINUED]  nitroGLYCERIN (NITROSTAT) 0.4 MG SL tablet Place 0.4 mg under the tongue every 5 (five) minutes as needed for chest pain. May use 3 times  . [DISCONTINUED] NON FORMULARY Diet: Pureed Diet, Honey Thickened Liquids - to be given by spoon.  . [DISCONTINUED] polyethylene glycol (MIRALAX / GLYCOLAX) packet Take 17 g by mouth daily.   . [DISCONTINUED] potassium chloride (KLOR-CON) 10 MEQ tablet Take 20 mEq by mouth daily.  . [DISCONTINUED] senna (SENOKOT) 8.6 MG tablet Take 2 tablets by mouth 2 (two) times daily as needed for constipation.  . [DISCONTINUED] sodium chloride 0.45 % solution INFUSE 2 LITERS @@75ML /HR VIA MIDLINE FOR HYDRATION   Facility-Administered Encounter Medications as of 08/18/2019  Medication  . albuterol (PROVENTIL) (2.5 MG/3ML) 0.083% nebulizer solution 2.5 mg  . citalopram (CELEXA) tablet 10 mg  . clindamycin (CLEOCIN) capsule 300 mg  . dorzolamide-timolol (COSOPT) 22.3-6.8 MG/ML ophthalmic solution 1 drop  . heparin injection 5,000 Units  . labetalol (NORMODYNE) injection 10 mg  . latanoprost (XALATAN) 0.005 % ophthalmic solution 1 drop  . lidocaine (LIDODERM) 5 % 1 patch  . ondansetron (ZOFRAN) tablet 4 mg  . potassium chloride SA (KLOR-CON) CR tablet 40 mEq  . QUEtiapine (SEROQUEL) tablet 100 mg  . Resource ThickenUp Clear  . sodium chloride flush (NS) 0.9 % injection 10-40 mL  . sodium chloride flush (NS) 0.9 % injection 10-40 mL  . sodium chloride flush (NS) 0.9 % injection 3 mL    Review of Systems  Constitutional: Positive for malaise/fatigue. Negative for chills and fever.  HENT: Negative for  congestion and sore throat.   Respiratory: Positive for cough. Negative for sputum production and shortness of breath.   Cardiovascular: Negative for chest pain, palpitations and leg swelling.  Gastrointestinal: Negative for abdominal pain and constipation.  Genitourinary: Negative for dysuria.  Musculoskeletal: Negative for falls and joint pain.  Neurological: Negative  for dizziness and loss of consciousness.  Endo/Heme/Allergies: Bruises/bleeds easily.  Psychiatric/Behavioral: Positive for memory loss. Negative for depression. The patient is not nervous/anxious and does not have insomnia.     Vitals:   08/18/19 1650  BP: 130/75  Pulse: 73  Resp: 20  Temp: 97.8 F (36.6 C)  TempSrc: Oral  SpO2: 95%  Weight: 78 lb 11.3 oz (35.7 kg)  Height: 4\' 11"  (1.499 m)   Body mass index is 15.9 kg/m. Physical Exam Constitutional:      General: She is not in acute distress.    Appearance: She is ill-appearing. She is not toxic-appearing.     Comments: Frail, cachectic female in bed  HENT:     Head: Normocephalic and atraumatic.     Right Ear: External ear normal.     Left Ear: External ear normal.     Nose: Nose normal.     Mouth/Throat:     Pharynx: Oropharynx is clear.  Eyes:     Pupils: Pupils are equal, round, and reactive to light.  Cardiovascular:     Rate and Rhythm: Normal rate and regular rhythm.     Pulses: Normal pulses.     Heart sounds: Normal heart sounds.  Pulmonary:     Effort: Pulmonary effort is normal.     Breath sounds: Normal breath sounds. No wheezing, rhonchi or rales.  Abdominal:     General: Abdomen is flat. Bowel sounds are normal.     Tenderness: There is no abdominal tenderness.  Musculoskeletal:        General: Normal range of motion.     Cervical back: Neck supple.     Right lower leg: No edema.     Left lower leg: No edema.  Lymphadenopathy:     Cervical: No cervical adenopathy.  Skin:    General: Skin is warm and dry.  Neurological:     General: No focal deficit present.     Mental Status: She is alert. Mental status is at baseline. She is disoriented.     Cranial Nerves: No cranial nerve deficit.     Motor: Weakness present.     Gait: Gait abnormal.     Comments: Stays in bed most of the day; minimal speech  Psychiatric:        Mood and Affect: Mood normal.     Labs reviewed: Basic Metabolic Panel:  Recent Labs    08/12/19 1944 08/13/19 0828 08/16/19 1601 08/17/19 0452 08/18/19 0415  NA 152*   < > 145 144 144  K 3.5   < > 3.2* 3.8 4.2  CL 112*   < > 110 109 108  CO2 24   < > 27 29 26   GLUCOSE 102*   < > 106* 105* 94  BUN 45*   < > 23 19 14   CREATININE 1.70*   < > 1.11* 1.09* 1.01*  CALCIUM 12.1*   < > 10.3 10.8* 10.4*  MG 2.5*  --   --   --   --   PHOS 3.3  --   --   --   --    < > = values in this interval not displayed.  Liver Function Tests: Recent Labs    08/14/19 0917 08/17/19 0452 08/18/19 0415  AST 21 18 18   ALT 17 16 15   ALKPHOS 77 72 78  BILITOT 0.4 0.5 0.3  PROT 7.4 6.4* 6.5  ALBUMIN 3.8 3.3* 3.3*   No results for input(s): LIPASE, AMYLASE in the last 8760 hours. No results for input(s): AMMONIA in the last 8760 hours. CBC: Recent Labs    01/28/19 0000 01/28/19 0000 03/27/19 0000 07/15/19 0000 08/12/19 1944 08/13/19 0828 08/15/19 0348 08/16/19 0646 08/17/19 0452  WBC 6.1   < > 6.4   < > 9.6   < > 6.7 6.9 11.9*  NEUTROABS 4  --  3  --  6.6  --   --   --   --   HGB 12.0   < > 13.1   < > 11.5*   < > 10.4* 10.8* 11.0*  HCT 37   < > 39   < > 37.2   < > 32.9* 34.5* 35.9*  MCV  --   --   --   --  93.0   < > 91.4 92.7 93.2  PLT 227   < > 218   < > 386   < > 299 264 278   < > = values in this interval not displayed.   Cardiac Enzymes: No results for input(s): CKTOTAL, CKMB, CKMBINDEX, TROPONINI in the last 8760 hours. BNP: Invalid input(s): POCBNP Lab Results  Component Value Date   HGBA1C 5.4 06/11/2016   Lab Results  Component Value Date   TSH 0.59 07/31/2017   Lab Results  Component Value Date   VITAMINB12 >2,000 07/15/2019   No results found for: FOLATE No results found for: IRON, TIBC, FERRITIN  Imaging and Procedures obtained prior to SNF admission: No results found.  Assessment/Plan 1. Hypernatremia -due to poor fluid intake due to her advanced dementia -was rehydrated and has repeat bmp in a week -if intake remains poor,  plan is for hospice care--needs MOST updated accordingly  2. Hypokalemia -potassium repleted  3. Acute renal failure superimposed on stage 3a chronic kidney disease, unspecified acute renal failure type ([place]) -resolved with rehydration, unfortunately, not expected to be for long with her minimal intake with advanced dementia -family educated at hospital by palliative care team and now understands progression and agreeable to plan for hospice if intake returns to being poor as expected  5. Adult failure to thrive -due to advanced dementia and not remembering or having the need to eat at end of life  6. Aspiration pneumonia of right upper lobe due to gastric secretions ([facility]) -was treated with clindamycin, clinically improved, but dysphagia persists  -precautions in place  7. Chronic obstructive pulmonary disease, unspecified COPD type ([place]) -has albuterol inhaler though I'm certain she does not have the strength to inhale it adequately to be effective so will d/c  8. Hypercalcemia -has remained mild--corrected was in 11 range, ?some mass actually contributing to recurrent pneumonia also; but would not tolerate treatment so further workup not indicated -PTH was normal here and at hospital  9. Advanced dementia ([place]) -end stages with decreased intake, most time in bed, minimally verbal, full adls dependence -great hospice candidate  10. Paranoia ([place]) -seroquel dose was reduced at hospital -hopefully she will tolerate this without becoming upset/agitated/anxous about delusions  11.  Arterial tortuosity ([place]) Still has this on imaging and increases risk of blockages and events No need for preventive approaches at end of life  Family/  staff Communication: discussed with snf nurse  Labs/tests ordered:  Has f/u bmp at one week per hospital to assess hydration status  Paislei Dorval L. Addi Pak, D.O. [facility] [address], [place]  [address] Cell Phone (Mon-Fri 8am-5pm):  [phone] On Call:  [phone] & follow prompts after 5pm & weekends Office Phone:  [phone] Office Fax:  [phone]

## 2019-08-18 NOTE — NC FL2 (Signed)
[place] LEVEL OF CARE SCREENING TOOL     IDENTIFICATION  Patient Name: Taylor Roth Birthdate: 06/26/1928 Sex: female Admission Date (Current Location): 08/12/2019  [facility] and [place] Number:  Herbalist and Address:         Provider Number: [id number]  Attending Physician Name and Address:  Nita Sells, MD  Relative Name and Phone Number:  Levin Bacon [phone]    Current Level of Care: Hospital Recommended Level of Care: [place] Prior Approval Number:    Date Approved/Denied:   PASRR Number:    Discharge Plan: SNF    Current Diagnoses: Patient Active Problem List   Diagnosis Date Noted  . Encounter for hospice care discussion   . Palliative care encounter   . Acute renal failure superimposed on stage 3a chronic kidney disease ([place]) 08/13/2019  . Hypernatremia 08/13/2019  . Hypercalcemia 08/13/2019  . Allergic rhinitis 06/09/2018  . Pressure injury of skin 06/05/2018  . MRSA bacteremia 06/04/2018  . Chronic generalized pain 05/16/2018  . Dry eye syndrome of both eyes 02/28/2018  . Abnormal EKG 01/24/2017  . Delusions ([place])   . Paranoia ([place])   . Vitamin D deficiency 08/25/2016  . Advanced dementia ([place]) 03/25/2016  . S/P ORIF (open reduction internal fixation) fracture 01/08/2016  . Oral thrush 01/08/2016  . HTN (hypertension) 01/08/2016  . Closed hip fracture requiring operative repair, right, with routine healing, subsequent encounter 12/19/2015  . Bipolar disease, chronic ([place]) 12/17/2015  . Decubitus ulcer of hip, stage 3 ([place]) 12/11/2015  . Legally blind 08/16/2015  . Ankle pain   . Encephalopathy   . Altered mental status 01/26/2015  . Nausea and vomiting 01/17/2015  . Esophageal dysmotilities   . Urinary tract infectious disease   . Weakness 01/14/2015  . Schatzki's ring 01/14/2015  . CKD (chronic kidney disease), stage III ([place]) 01/14/2015  . Esophagus disorder   . Dysphagia  01/11/2015  . Lower urinary tract infectious disease 03/30/2014  . Hearing loss, sensorineural 03/04/2014  . Nasal lesion 03/04/2014  . Constipation 12/12/2012  . Hypokalemia 12/11/2012  . Orthostatic hypotension 12/11/2012  . Arterial tortuosity (aorta) 12/11/2012  . Protein-calorie malnutrition, severe ([place]) 12/10/2012  . Adult failure to thrive 12/09/2012  . Decreased rectal sphincter tone 11/21/2012  . Primary open angle glaucoma of both eyes, severe stage 11/11/2012  . Idiopathic scoliosis 07/08/2012  . Macular atrophy, retinal 06/26/2011  . Vision disturbance 06/26/2011  . Glaucoma 06/26/2011  . Nonexudative senile macular degeneration of retina 06/21/2011  . Macula scar of posterior pole 06/21/2011  . Status post intraocular lens implant 06/21/2011  . Anemia of chronic disease 04/17/2011  . Drug-seeking behavior 02/26/2011  . Osteoporosis 02/26/2011  . Depression   . Anxiety   . COPD (chronic obstructive pulmonary disease) ([place])   . GERD (gastroesophageal reflux disease)   . Migraines   . Insomnia, unspecified     Orientation RESPIRATION BLADDER Height & Weight     Self  Normal Incontinent Weight: 35.7 kg Height:  4\' 11"  (149.9 cm)  BEHAVIORAL SYMPTOMS/MOOD NEUROLOGICAL BOWEL NUTRITION STATUS      Incontinent Diet (FUll liquids)  AMBULATORY STATUS COMMUNICATION OF NEEDS Skin   Total Care   Normal                       Personal Care Assistance Level of Assistance  Total care       Total Care Assistance: Maximum assistance   Functional Limitations  Info  Sight, Hearing, Speech Sight Info: Impaired Hearing Info: Adequate Speech Info: Impaired    SPECIAL CARE FACTORS FREQUENCY                       Contractures Contractures Info: Not present    Additional Factors Info  Code Status, Allergies Code Status Info: DNR Allergies Info: Amoxicillin, Ampicillin, Depakote Valproic Acid, Macrobid Nitrofurantoin Macrocrystal, Penicillins,  Acetaminophen, Azithromycin, Butalbital, Caffeine, Chlorhexidine, Doxycycline, Escitalopram Oxalate, Fioricet Butalbital-apap-caffeine, Latex, Levofloxacin, Levofloxacin, Nsaids, Oxycodone, Restasis Cyclosporine, Sulfa Antibiotics, Biaxin Clarithromycin           Current Medications (08/18/2019):  This is the current hospital active medication list Current Facility-Administered Medications  Medication Dose Route Frequency Provider Last Rate Last Admin  . albuterol (PROVENTIL) (2.5 MG/3ML) 0.083% nebulizer solution 2.5 mg  2.5 mg Nebulization Q6H PRN Opyd, Ilene Qua, MD      . citalopram (CELEXA) tablet 10 mg  10 mg Oral Daily Opyd, Ilene Qua, MD   10 mg at 08/17/19 1225  . clindamycin (CLEOCIN) capsule 300 mg  300 mg Oral Q8H Nita Sells, MD   300 mg at 08/17/19 2052  . dorzolamide-timolol (COSOPT) 22.3-6.8 MG/ML ophthalmic solution 1 drop  1 drop Both Eyes BID Opyd, Ilene Qua, MD   1 drop at 08/17/19 1227  . heparin injection 5,000 Units  5,000 Units Subcutaneous Q8H Vianne Bulls, MD   5,000 Units at 08/17/19 0636  . labetalol (NORMODYNE) injection 10 mg  10 mg Intravenous Q2H PRN Opyd, Ilene Qua, MD   10 mg at 08/13/19 1457  . latanoprost (XALATAN) 0.005 % ophthalmic solution 1 drop  1 drop Both Eyes QHS Opyd, Ilene Qua, MD   1 drop at 08/16/19 2117  . lidocaine (LIDODERM) 5 % 1 patch  1 patch Transdermal Q24H Opyd, Ilene Qua, MD   1 patch at 08/17/19 1226  . ondansetron (ZOFRAN) tablet 4 mg  4 mg Oral Q6H PRN Opyd, Ilene Qua, MD      . potassium chloride SA (KLOR-CON) CR tablet 40 mEq  40 mEq Oral Daily Nita Sells, MD   40 mEq at 08/17/19 1226  . QUEtiapine (SEROQUEL) tablet 100 mg  100 mg Oral QHS Nita Sells, MD   100 mg at 08/17/19 2052  . Resource ThickenUp Clear   Oral PRN Nita Sells, MD      . sodium chloride flush (NS) 0.9 % injection 10-40 mL  10-40 mL Intracatheter Q12H Nita Sells, MD   10 mL at 08/17/19 1227  . sodium chloride flush  (NS) 0.9 % injection 10-40 mL  10-40 mL Intracatheter PRN Nita Sells, MD   10 mL at 08/14/19 0415  . sodium chloride flush (NS) 0.9 % injection 3 mL  3 mL Intravenous Q12H Opyd, Ilene Qua, MD   3 mL at 08/17/19 1226     Discharge Medications: Please see discharge summary for a list of discharge medications.  Relevant Imaging Results:  Relevant Lab Results:   Additional Information [id number]  Purcell Mouton, RN

## 2019-08-18 NOTE — Progress Notes (Signed)
Daily Progress Note   Patient Name: Taylor Roth       Date: 08/18/2019 DOB: [birth year]  Age: 86 y.o. MRN#: [id number] Attending Physician: Nita Sells, MD Primary Care Physician: Gayland Curry, DO Admit Date: 08/12/2019  Reason for Consultation/Follow-up: To discuss complex medical decision making related to patient's goals of care  Subjective: Chart review completed.  Went to visit patient at bedside - daughter/Taylor Roth and son-in-law present. Taylor Roth recently arrived from [place]. Patient was lying in bed - lethargic, does not wake to voice, not able to participate in conversation. No gestures or non-verbal signs of discomfort were noted. RN stated that patient has taken small bites of applesauce.  Discussed patient's plan of care with Taylor Roth and her husband. Education was provided on trajectory of dementia and aspiration pneumonia. Residential hospice vs SNF level of care and support were outlined and discussed in light of the patient's current medical situation. Taylor Roth was still agreeable for discharge to SNF with hospice. Discussed patients frailty and recurrent pneumonias and encouraged family to give thought to if they would want rehospitalizations in the future.   Assessment: Acute renal failure on CKD COPD Adult failure to thrive Advanced dementia Chronic generalized pain Aspiration pneumonia Severe hypernatremia secondary to volume depletion Weakness Poor PO intake   Patient Profile/HPI:   86 y.o.femalewith past medical history of COPD, blindness, anemia, constipation, Bipolar, advanced dementia, aspiration PNA, hypernatremia, and hypercalcemiaadmitted on 6/30/2021with dehydration, hypercalcemia, FTT, and advanced dementia.She is a long term care resident at [facility] and is followed in the community by Dr. Mariea Clonts with [facility]. She has a long history of dementia with psychosis and delusions. She is at risk for aspiration with schatzki's ring and esophageal dysmotility. Chart review reveals that she has known history of hypercalcemia and had 10 lb weight loss from Feb to March and was found to have hypercalcemia at that time. Recommendation per Dr. [name] was to forgo workup for cancer due to age and dementia with severely compromised functional status. She was being seen at facility and treated for suspected PNA with rocephin and fluid support. She was then transferred to the ED for further evaluation. Palliative consulted for [place].   Length of Stay: 5   Vital Signs: BP 123/86 (BP Location: Right Arm)   Pulse (!) 59  Temp (!) 97.4 F (36.3 C) (Axillary)   Resp 20   Ht 4\' 11"  (1.499 m)   Wt 35.7 kg   LMP  (LMP Unknown)   SpO2 99%   BMI 15.90 kg/m  SpO2: SpO2: 99 % O2 Device: O2 Device: Room Air O2 Flow Rate:         Palliative Assessment/Data:  20%     Palliative Care Plan    Recommendations/Plan:   Continue current medical treatment  Continue DNR/DNI as previously documented  Plan is for return to [facility] SNF with hospice services when medically appropriate for DC - TOC and hospice liaison notified  Code Status:  DNR  Prognosis:   Weeks to months - given advanced dementia, poor po intake, and recurrent dehydration and aspiration pneumonia  Discharge Planning:  [place] with Hospice  Care plan was discussed with daughter, son-in-law, primary RN, Dr. Verlon Au, [facility]  Thank you for allowing the Palliative Medicine Team to assist in the care of this patient.  Total time spent:  25 min.      Greater than 50% of this time was spent counseling and coordinating care related to the above assessment and plan.  Amber M. Tamala Julian, FNP  [name] MD.   Please contact Palliative  MedicineTeam phone at [phone] for questions and concerns between 7 am - 7 pm.   Please see AMION for individual provider pager numbers.

## 2019-08-19 ENCOUNTER — Encounter: Payer: Self-pay | Admitting: Internal Medicine

## 2019-08-25 LAB — BASIC METABOLIC PANEL
BUN: 26 — AB (ref 4–21)
CO2: 21 (ref 13–22)
Chloride: 105 (ref 99–108)
Creatinine: 1 (ref 0.5–1.1)
Glucose: 86
Potassium: 4.3 (ref 3.4–5.3)
Sodium: 141 (ref 137–147)

## 2019-08-25 LAB — CBC: RBC: 3.78 — AB (ref 3.87–5.11)

## 2019-08-25 LAB — COMPREHENSIVE METABOLIC PANEL
Calcium: 11 — AB (ref 8.7–10.7)
GFR calc Af Amer: 56.09
GFR calc non Af Amer: 48.39

## 2019-08-25 LAB — CBC AND DIFFERENTIAL
HCT: 34 — AB (ref 36–46)
Hemoglobin: 11.1 — AB (ref 12.0–16.0)
Platelets: 248 (ref 150–399)
WBC: 5.6

## 2019-09-28 ENCOUNTER — Non-Acute Institutional Stay (SKILLED_NURSING_FACILITY): Payer: Medicare Other | Admitting: Family

## 2019-09-28 ENCOUNTER — Encounter: Payer: Self-pay | Admitting: Family

## 2019-09-28 DIAGNOSIS — J449 Chronic obstructive pulmonary disease, unspecified: Secondary | ICD-10-CM | POA: Diagnosis not present

## 2019-09-28 DIAGNOSIS — F319 Bipolar disorder, unspecified: Secondary | ICD-10-CM | POA: Diagnosis not present

## 2019-09-28 DIAGNOSIS — M79605 Pain in left leg: Secondary | ICD-10-CM | POA: Diagnosis not present

## 2019-09-28 DIAGNOSIS — R627 Adult failure to thrive: Secondary | ICD-10-CM | POA: Diagnosis not present

## 2019-09-28 NOTE — Progress Notes (Signed)
Location:    [facility].   Nursing Home Room Number: 304-D Place of Service:  SNF (31) Provider:  Marlowe Sax, NP    Patient Care Team: Gayland Curry, DO as PCP - General (Geriatric Medicine) [name], MD (Gastroenterology) [name], NP (Obstetrics and Gynecology) Rehab, [facility] ([place])  Extended Emergency Contact Information Primary Emergency Contact: [name] [place] Phone: [phone] Mobile Phone: [phone] Relation: Daughter Secondary Emergency Contact: Gwyndolyn Kaufman [place] of [place] Phone: [phone] Mobile Phone: [phone] Relation: Sister  Code Status:  DNR Goals of care: Advanced Directive information Advanced Directives 09/28/2019  Does Patient Have a Medical Advance Directive? Yes  Type of Advance Directive Out of facility DNR (pink MOST or yellow form)  Does patient want to make changes to medical advance directive? No - Patient declined  Copy of [place] in Chart? -  Would patient like information on creating a medical advance directive? -  Pre-existing out of facility DNR order (yellow form or pink MOST form) Yellow form placed in chart (order not valid for inpatient use)     Chief Complaint  Patient presents with  . Medical Management of Chronic Issues    Routine Visit.   . Immunizations    Dicsuss the need for Tetanus Vaccine, and Influenza Vaccine.    HPI:  Pt is a 86 y.o. female seen today for medical management of chronic diseases.she is seen in her room sitting on wheelchair.she states having left hip pain when asked whether in pain.she on lidoderm patch and tylenol 500 mg tablet she seems to be more alert today,answering questions appropriate and follows most commands.No recent fall episode reported. She has had a one pound weight gain over one month. Her B/p log reviewed soft readings noted 90's/50's -  120's/80's.Not on any blood pressure medication.Hospice service continue to follow up. Facility Nurse reports no new concerns.   Past Medical History:  Diagnosis Date  . Anxiety   . Arterial tortuosity (aorta) 12/11/2012  . Asthma   . Chronic kidney disease, stage IV (severe) ([place]) 12/11/2012  . Chronic mental illness   . CKD (chronic kidney disease), stage III 01/14/2015  . Closed hip fracture requiring operative repair, right, with routine healing, subsequent encounter 12/19/2015  . Colitis   . COPD (chronic obstructive pulmonary disease) ([place])   . Decreased rectal sphincter tone 11/21/2012  . Depression   . Drug-seeking behavior 02/26/2011   Use of multiple md to obtain benzos/narcotics pt must sign contract to receive controlled meds.   Marland Kitchen Dysphagia 01/11/2015  . GERD (gastroesophageal reflux disease)   . Glaucoma 06/26/2011  . Glaucoma of both eyes   . Hearing loss, sensorineural 03/04/2014   [facility]; 02/26/14. There is not a medical or surgical treatment that would benefit her type of hearing loss. We discussed amplification in an overview fashion.    Marland Kitchen History of fall   . Insomnia, unspecified   . Macular degeneration 06/26/2011  . Migraines   . Nasal bone fracture 03/30/2014  . Osteoporosis   . Osteoporosis 02/26/2011  . Presence of right artificial hip joint   . Protein-calorie malnutrition, severe ([place]) 12/10/2012  . S/P ORIF (open reduction internal fixation) fracture 01/08/2016  . Schatzki's ring 01/14/2015  . Scoliosis   . Substance abuse ([place])    Benbzos and Narcotics, she does not get any of those medicines from [facility], we have empathetically told her  that.  . Vertigo   . Vitamin B deficiency   . Vitamin D deficiency 08/25/2016  . Weight loss    Past Surgical History:  Procedure Laterality Date  . ABDOMINAL HYSTERECTOMY    . CATARACT EXTRACTION    . CHOLECYSTECTOMY    . ESOPHAGOGASTRODUODENOSCOPY (EGD) WITH PROPOFOL N/A 01/13/2015   Procedure:  ESOPHAGOGASTRODUODENOSCOPY (EGD) WITH PROPOFOL;  Surgeon: [name], MD;  Location: [facility];  Service: Endoscopy;  Laterality: N/A;  . HIP ARTHROPLASTY Right 12/21/2015   Procedure: ARTHROPLASTY BIPOLAR HIP (HEMIARTHROPLASTY);  Surgeon: [name], MD;  Location: [facility];  Service: Orthopedics;  Laterality: Right;  . PARTIAL HYSTERECTOMY      Allergies  Allergen Reactions  . Amoxicillin     Per daughter  . Ampicillin     Per daughter  . Depakote [Valproic Acid]     DOES NOT WANT HER TO TAKE IT==Makes her hair fall out= per daughter who is poa  . Macrobid [Nitrofurantoin Macrocrystal] Rash    Per MAR  . Penicillins     Per daughter  . Acetaminophen Other (See Comments)    jaundice  . Azithromycin Other (See Comments)    Per MAR  . Butalbital   . Caffeine   . Chlorhexidine     Patient does not have central line or foley catheter  . Doxycycline Other (See Comments)    Per MAR  . Escitalopram Oxalate Other (See Comments)    Per MAR  . Fioricet [Butalbital-Apap-Caffeine] Other (See Comments)    Drunk. Per MAR  . Latex Other (See Comments)    Per MAR  . Levofloxacin Other (See Comments)    Per MAR  . Levofloxacin   . Nsaids Other (See Comments)    This is not an allergy. The patient was told by Dr. Rockne Menghini to avoid NSAIDs and stop Vicoprofen in to 2014 to avoid nephrotoxicity.  Per MAR.  Marland Kitchen Oxycodone Other (See Comments)    reaction to synthetic codeine Per MAR  . Restasis [Cyclosporine] Other (See Comments)    Per MAR  . Sulfa Antibiotics Other (See Comments)    Per MAR  . Biaxin [Clarithromycin] Rash    With burning sensation Per MAR    Allergies as of 09/28/2019      Reactions   Amoxicillin    Per daughter   Ampicillin    Per daughter   Depakote [valproic Acid]    DOES NOT WANT HER TO TAKE IT==Makes her hair fall out= per daughter who is poa   Macrobid [nitrofurantoin Macrocrystal] Rash   Per MAR   Penicillins    Per daughter   Acetaminophen  Other (See Comments)   jaundice   Azithromycin Other (See Comments)   Per MAR   Butalbital    Caffeine    Chlorhexidine    Patient does not have central line or foley catheter   Doxycycline Other (See Comments)   Per MAR   Escitalopram Oxalate Other (See Comments)   Per MAR   Fioricet [butalbital-apap-caffeine] Other (See Comments)   Drunk. Per MAR   Latex Other (See Comments)   Per MAR   Levofloxacin Other (See Comments)   Per MAR   Levofloxacin    Nsaids Other (See Comments)   This is not an allergy. The patient was told by Dr. Rockne Menghini to avoid NSAIDs and stop Vicoprofen in to 2014 to avoid nephrotoxicity. Per MAR.   Oxycodone Other (See Comments)   reaction to synthetic codeine Per [facility]  Restasis [cyclosporine] Other (See Comments)   Per MAR   Sulfa Antibiotics Other (See Comments)   Per MAR   Biaxin [clarithromycin] Rash   With burning sensation Per [facility]      Medication List       Accurate as of September 28, 2019 10:55 AM. If you have any questions, ask your nurse or doctor.        STOP taking these medications   albuterol 108 (90 Base) MCG/ACT inhaler Commonly known as: VENTOLIN HFA Stopped by: Sandrea Hughs, NP   citalopram 10 MG tablet Commonly known as: CELEXA Stopped by: Sandrea Hughs, NP   clindamycin 300 MG capsule Commonly known as: CLEOCIN Stopped by: Sandrea Hughs, NP   fludrocortisone 0.1 MG tablet Commonly known as: FLORINEF Stopped by: Sandrea Hughs, NP   NON FORMULARY Stopped by: Sandrea Hughs, NP   NON FORMULARY Stopped by: Sandrea Hughs, NP   potassium chloride SA 20 MEQ tablet Commonly known as: KLOR-CON Stopped by: Sandrea Hughs, NP     TAKE these medications   acetaminophen 500 MG tablet Commonly known as: TYLENOL Give 2 tablets ( 1,000 mg total ) by mouth nightly for pain   dorzolamide-timolol 22.3-6.8 MG/ML ophthalmic solution Commonly known as: COSOPT Place 1 drop into both eyes 2 (two) times daily.     latanoprost 0.005 % ophthalmic solution Commonly known as: XALATAN Place 1 drop into both eyes at bedtime.   lidocaine 5 % Commonly known as: LIDODERM APPLY 1 PATCH TO ANT. LEFT THIGH DAILY LEAVE ON X 12 HOURS THEN REMOVE   QUEtiapine 100 MG tablet Commonly known as: SEROQUEL Take 100 mg by mouth at bedtime. Take along with a 25 mg tablet to = 125 mg qhs   RA SALINE ENEMA RE If not relieved by Biscodyl suppository, give disposable Saline Enema rectally X 1 dose/24 hrs as needed (Do not use constipation standing orders for residents with renal failure/CFR less than 30. Contact MD for orders)(Physician Or   senna 8.6 MG Tabs tablet Commonly known as: SENOKOT Take 2 tablets by mouth daily.   Systane 0.4-0.3 % Soln Generic drug: Polyethyl Glycol-Propyl Glycol Place 2 drops into both eyes every 2 (two) hours as needed (dry eyes).       Review of Systems  Unable to perform ROS: Dementia    Immunization History  Administered Date(s) Administered  . Influenza Split 11/13/2011  . Influenza-Unspecified 02/13/2008, 11/30/2013, 12/20/2016  . Moderna SARS-COVID-2 Vaccination 02/12/2019, 03/12/2019  . PPD Test 12/15/2015  . Pneumococcal Conjugate-13 10/16/2016  . Pneumococcal-Unspecified 02/13/2008  . Td 02/12/2009   Pertinent  Health Maintenance Due  Topic Date Due  . INFLUENZA VACCINE  09/13/2019  . DEXA SCAN  Completed  . PNA vac Low Risk Adult  Completed   Fall Risk  08/27/2017 08/22/2016  Falls in the past year? No Yes  Number falls in past yr: - 2 or more  Injury with Fall? - Yes  Comment - back of head    Vitals:   09/28/19 1042  BP: (!) 93/51  Pulse: 71  Resp: 18  Temp: (!) 95.4 F (35.2 C)  SpO2: 94%  Weight: 76 lb (34.5 kg)  Height: 4\' 11"  (1.499 m)   Body mass index is 15.35 kg/m. Physical Exam Vitals reviewed.  Constitutional:      Appearance: She is not ill-appearing.     Comments: Frail elderly in acute distress   HENT:     Head: Normocephalic.  Nose: Nose normal. No congestion or rhinorrhea.     Mouth/Throat:     Mouth: Mucous membranes are moist.     Pharynx: No oropharyngeal exudate or posterior oropharyngeal erythema.  Eyes:     General: No scleral icterus.       Right eye: No discharge.        Left eye: No discharge.     Conjunctiva/sclera: Conjunctivae normal.     Pupils: Pupils are equal, round, and reactive to light.  Cardiovascular:     Rate and Rhythm: Normal rate and regular rhythm.     Pulses: Normal pulses.     Heart sounds: Normal heart sounds. No murmur heard.  No friction rub. No gallop.   Pulmonary:     Effort: Pulmonary effort is normal. No respiratory distress.     Breath sounds: Normal breath sounds. No wheezing, rhonchi or rales.  Chest:     Chest wall: No tenderness.  Abdominal:     General: Bowel sounds are normal. There is no distension.     Palpations: Abdomen is soft. There is no mass.     Tenderness: There is no abdominal tenderness. There is no right CVA tenderness, left CVA tenderness, guarding or rebound.  Musculoskeletal:        General: No swelling or tenderness.     Cervical back: Normal range of motion. No rigidity or tenderness.     Right lower leg: No edema.     Left lower leg: No edema.     Comments: Require assistance with transfer   Lymphadenopathy:     Cervical: No cervical adenopathy.  Skin:    General: Skin is warm.     Coloration: Skin is not pale.     Findings: No bruising, erythema or rash.  Neurological:     Mental Status: She is alert. Mental status is at baseline.     Coordination: Coordination normal.     Gait: Gait abnormal.     Comments: HOH   Psychiatric:        Mood and Affect: Mood normal.        Behavior: Behavior normal.        Thought Content: Thought content normal.        Cognition and Memory: Memory is impaired.        Judgment: Judgment normal.    Labs reviewed: Recent Labs    08/12/19 1944 08/13/19 0828 08/16/19 1601 08/16/19 1601  08/17/19 0452 08/18/19 0415 08/25/19 0000  NA 152*   < > 145   < > 144 144 141  K 3.5   < > 3.2*   < > 3.8 4.2 4.3  CL 112*   < > 110   < > 109 108 105  CO2 24   < > 27   < > 29 26 21   GLUCOSE 102*   < > 106*  --  105* 94  --   BUN 45*   < > 23   < > 19 14 26*  CREATININE 1.70*   < > 1.11*   < > 1.09* 1.01* 1.0  CALCIUM 12.1*   < > 10.3   < > 10.8* 10.4* 11.0*  MG 2.5*  --   --   --   --   --   --   PHOS 3.3  --   --   --   --   --   --    < > = values in this interval not displayed.  Recent Labs    08/14/19 0917 08/17/19 0452 08/18/19 0415  AST 21 18 18   ALT 17 16 15   ALKPHOS 77 72 78  BILITOT 0.4 0.5 0.3  PROT 7.4 6.4* 6.5  ALBUMIN 3.8 3.3* 3.3*   Recent Labs    01/28/19 0000 01/28/19 0000 03/27/19 0000 07/15/19 0000 08/12/19 1944 08/13/19 0828 08/15/19 0348 08/15/19 0348 08/16/19 0646 08/17/19 0452 08/25/19 0000  WBC 6.1   < > 6.4   < > 9.6   < > 6.7   < > 6.9 11.9* 5.6  NEUTROABS 4  --  3  --  6.6  --   --   --   --   --   --   HGB 12.0   < > 13.1   < > 11.5*   < > 10.4*   < > 10.8* 11.0* 11.1*  HCT 37   < > 39   < > 37.2   < > 32.9*   < > 34.5* 35.9* 34*  MCV  --   --   --   --  93.0   < > 91.4  --  92.7 93.2  --   PLT 227   < > 218   < > 386   < > 299   < > 264 278 248   < > = values in this interval not displayed.   Lab Results  Component Value Date   TSH 0.59 07/31/2017   Lab Results  Component Value Date   HGBA1C 5.4 06/11/2016   Lab Results  Component Value Date   CHOL 134 07/31/2017   HDL 54 06/11/2016   LDLCALC 61 07/31/2017   TRIG 102 07/31/2017    Significant Diagnostic Results in last 30 days:  No results found.  Assessment/Plan 1. Pain of left lower extremity Seems comfortale during visit no groaning or grimaces.continue on Lidoderm patch and tylenol   2. Adult failure to thrive Had lost weight but weight has stabilized for the past one month gained one pound.continue to encourage oral intake. - continue on Hospice service.    3. Chronic obstructive pulmonary disease, unspecified COPD type ([place]) Breathing stable on room air.No cough noted  Off albuterol PRN   4. Bipolar disease, chronic (HCC) Mood stable. Continue on Seroquel 100 mg daily at bedtime. Monitor mood.   Family/ staff Communication: Reviewed plan of care with patient and facility Nurse.  Labs/tests ordered: None

## 2019-10-22 ENCOUNTER — Encounter: Payer: Self-pay | Admitting: Family

## 2019-10-22 ENCOUNTER — Non-Acute Institutional Stay (SKILLED_NURSING_FACILITY): Payer: Medicare Other | Admitting: Family

## 2019-10-22 DIAGNOSIS — F424 Excoriation (skin-picking) disorder: Secondary | ICD-10-CM

## 2019-10-22 NOTE — Progress Notes (Signed)
Location:    [facility].   Nursing Home Room Number: 304-D Place of Service:  SNF (31) Provider:  Marlowe Sax, NP    Patient Care Team: Gayland Curry, DO as PCP - General (Geriatric Medicine) [name], MD (Gastroenterology) [name], NP (Obstetrics and Gynecology) Rehab, [facility] ([place])  Extended Emergency Contact Information Primary Emergency Contact: [name] [place] of [place] Phone: [phone] Mobile Phone: [phone] Relation: Daughter Secondary Emergency Contact: Gwyndolyn Kaufman [place] of [place] Phone: [phone] Mobile Phone: [phone] Relation: Sister  Code Status:  DNR Goals of care: Advanced Directive information Advanced Directives 10/22/2019  Does Patient Have a Medical Advance Directive? Yes  Type of Advance Directive Out of facility DNR (pink MOST or yellow form);Healthcare Power of Attorney  Does patient want to make changes to medical advance directive? No - Patient declined  Copy of [place] in Chart? Yes - validated most recent copy scanned in chart (See row information)  Would patient like information on creating a medical advance directive? -  Pre-existing out of facility DNR order (yellow form or pink MOST form) Yellow form placed in chart (order not valid for inpatient use)     Chief Complaint  Patient presents with  . Acute Visit    Sores around the mouth    HPI:  Pt is a 86 y.o. female seen today for an acute visit for evaluation of sore areas around her mouth.she is seen in her room sitting on wheelchair with facility Nurse present at bedside.Nurse reports patient picks on the areas on her chin.Patient unable to provide HPI information.Just states wants to get back to bed.No fever ,chills or drainage reported. Nurse states patient had blisters around the mouth in the past and was treated with antiviral with relief.current  sores does not resemble previous blister.   Past Medical History:  Diagnosis Date  . Anxiety   . Arterial tortuosity (aorta) 12/11/2012  . Asthma   . Chronic kidney disease, stage IV (severe) ([place]) 12/11/2012  . Chronic mental illness   . CKD (chronic kidney disease), stage III 01/14/2015  . Closed hip fracture requiring operative repair, right, with routine healing, subsequent encounter 12/19/2015  . Colitis   . COPD (chronic obstructive pulmonary disease) ([place])   . Decreased rectal sphincter tone 11/21/2012  . Depression   . Drug-seeking behavior 02/26/2011   Use of multiple md to obtain benzos/narcotics pt must sign contract to receive controlled meds.   Marland Kitchen Dysphagia 01/11/2015  . GERD (gastroesophageal reflux disease)   . Glaucoma 06/26/2011  . Glaucoma of both eyes   . Hearing loss, sensorineural 03/04/2014   [facility]; 02/26/14. There is not a medical or surgical treatment that would benefit her type of hearing loss. We discussed amplification in an overview fashion.    Marland Kitchen History of fall   . Insomnia, unspecified   . Macular degeneration 06/26/2011  . Migraines   . Nasal bone fracture 03/30/2014  . Osteoporosis   . Osteoporosis 02/26/2011  . Presence of right artificial hip joint   . Protein-calorie malnutrition, severe ([place]) 12/10/2012  . S/P ORIF (open reduction internal fixation) fracture 01/08/2016  . Schatzki's ring 01/14/2015  . Scoliosis   . Substance abuse ([place])    Benbzos and Narcotics, she does not get any of those medicines from [facility], we have empathetically told her that.  . Vertigo   . Vitamin B deficiency   .  Vitamin D deficiency 08/25/2016  . Weight loss    Past Surgical History:  Procedure Laterality Date  . ABDOMINAL HYSTERECTOMY    . CATARACT EXTRACTION    . CHOLECYSTECTOMY    . ESOPHAGOGASTRODUODENOSCOPY (EGD) WITH PROPOFOL N/A 01/13/2015   Procedure: ESOPHAGOGASTRODUODENOSCOPY (EGD) WITH PROPOFOL;  Surgeon: [name], MD;  Location: [facility];   Service: Endoscopy;  Laterality: N/A;  . HIP ARTHROPLASTY Right 12/21/2015   Procedure: ARTHROPLASTY BIPOLAR HIP (HEMIARTHROPLASTY);  Surgeon: [name], MD;  Location: [facility];  Service: Orthopedics;  Laterality: Right;  . PARTIAL HYSTERECTOMY      Allergies  Allergen Reactions  . Amoxicillin     Per daughter  . Ampicillin     Per daughter  . Depakote [Valproic Acid]     DOES NOT WANT HER TO TAKE IT==Makes her hair fall out= per daughter who is poa  . Macrobid [Nitrofurantoin Macrocrystal] Rash    Per MAR  . Penicillins     Per daughter  . Acetaminophen Other (See Comments)    jaundice  . Azithromycin Other (See Comments)    Per MAR  . Butalbital   . Caffeine   . Chlorhexidine     Patient does not have central line or foley catheter  . Doxycycline Other (See Comments)    Per MAR  . Escitalopram Oxalate Other (See Comments)    Per MAR  . Fioricet [Butalbital-Apap-Caffeine] Other (See Comments)    Drunk. Per MAR  . Latex Other (See Comments)    Per MAR  . Levofloxacin Other (See Comments)    Per MAR  . Levofloxacin   . Nsaids Other (See Comments)    This is not an allergy. The patient was told by Dr. Rockne Menghini to avoid NSAIDs and stop Vicoprofen in to 2014 to avoid nephrotoxicity.  Per MAR.  Marland Kitchen Oxycodone Other (See Comments)    reaction to synthetic codeine Per MAR  . Restasis [Cyclosporine] Other (See Comments)    Per MAR  . Sulfa Antibiotics Other (See Comments)    Per MAR  . Biaxin [Clarithromycin] Rash    With burning sensation Per MAR    Allergies as of 10/22/2019      Reactions   Amoxicillin    Per daughter   Ampicillin    Per daughter   Depakote [valproic Acid]    DOES NOT WANT HER TO TAKE IT==Makes her hair fall out= per daughter who is poa   Macrobid [nitrofurantoin Macrocrystal] Rash   Per MAR   Penicillins    Per daughter   Acetaminophen Other (See Comments)   jaundice   Azithromycin Other (See Comments)   Per MAR   Butalbital     Caffeine    Chlorhexidine    Patient does not have central line or foley catheter   Doxycycline Other (See Comments)   Per MAR   Escitalopram Oxalate Other (See Comments)   Per MAR   Fioricet [butalbital-apap-caffeine] Other (See Comments)   Drunk. Per MAR   Latex Other (See Comments)   Per MAR   Levofloxacin Other (See Comments)   Per MAR   Levofloxacin    Nsaids Other (See Comments)   This is not an allergy. The patient was told by Dr. Rockne Menghini to avoid NSAIDs and stop Vicoprofen in to 2014 to avoid nephrotoxicity. Per MAR.   Oxycodone Other (See Comments)   reaction to synthetic codeine Per MAR   Restasis [cyclosporine] Other (See Comments)   Per MAR   Sulfa  Antibiotics Other (See Comments)   Per MAR   Biaxin [clarithromycin] Rash   With burning sensation Per [facility]      Medication List       Accurate as of October 22, 2019  3:22 PM. If you have any questions, ask your nurse or doctor.        STOP taking these medications   RA SALINE ENEMA RE Stopped by: Sandrea Hughs, NP     TAKE these medications   acetaminophen 500 MG tablet Commonly known as: TYLENOL Give 2 tablets ( 1,000 mg total ) by mouth nightly for pain   dorzolamide-timolol 22.3-6.8 MG/ML ophthalmic solution Commonly known as: COSOPT Place 1 drop into both eyes 2 (two) times daily.   latanoprost 0.005 % ophthalmic solution Commonly known as: XALATAN Place 1 drop into both eyes at bedtime.   lidocaine 5 % Commonly known as: LIDODERM APPLY 1 PATCH TO ANT. LEFT THIGH DAILY LEAVE ON X 12 HOURS THEN REMOVE   QUEtiapine 100 MG tablet Commonly known as: SEROQUEL Take 100 mg by mouth at bedtime. Take along with a 25 mg tablet to = 125 mg qhs   senna 8.6 MG Tabs tablet Commonly known as: SENOKOT Take 2 tablets by mouth daily.   Systane 0.4-0.3 % Soln Generic drug: Polyethyl Glycol-Propyl Glycol Place 2 drops into both eyes every 2 (two) hours as needed (dry eyes).       Review of Systems    Unable to perform ROS: Dementia    Immunization History  Administered Date(s) Administered  . Influenza Split 11/13/2011  . Influenza-Unspecified 02/13/2008, 11/30/2013, 12/20/2016  . Moderna SARS-COVID-2 Vaccination 02/12/2019, 03/12/2019  . PPD Test 12/15/2015  . Pneumococcal Conjugate-13 10/16/2016  . Pneumococcal-Unspecified 02/13/2008  . Td 02/12/2009   Pertinent  Health Maintenance Due  Topic Date Due  . INFLUENZA VACCINE  09/13/2019  . DEXA SCAN  Completed  . PNA vac Low Risk Adult  Completed   Fall Risk  08/27/2017 08/22/2016  Falls in the past year? No Yes  Number falls in past yr: - 2 or more  Injury with Fall? - Yes  Comment - back of head   Functional Status Survey:    Vitals:   10/22/19 1458  BP: (!) 149/73  Pulse: 73  Resp: 17  Temp: (!) 97.1 F (36.2 C)  Weight: 76 lb (34.5 kg)  Height: 4\' 11"  (1.499 m)   Body mass index is 15.35 kg/m. Physical Exam Vitals and nursing note reviewed.  Constitutional:      General: She is not in acute distress.    Appearance: She is not ill-appearing.     Comments: Frail elderly   HENT:     Head: Normocephalic.     Nose: Nose normal. No congestion or rhinorrhea.     Mouth/Throat:     Mouth: Mucous membranes are moist.     Pharynx: Oropharynx is clear. No oropharyngeal exudate or posterior oropharyngeal erythema.  Eyes:     General: No scleral icterus.       Right eye: No discharge.        Left eye: No discharge.     Conjunctiva/sclera: Conjunctivae normal.     Pupils: Pupils are equal, round, and reactive to light.  Cardiovascular:     Rate and Rhythm: Normal rate and regular rhythm.     Pulses: Normal pulses.     Heart sounds: Normal heart sounds. No murmur heard.  No friction rub. No gallop.   Pulmonary:  Effort: Pulmonary effort is normal. No respiratory distress.     Breath sounds: Normal breath sounds. No wheezing, rhonchi or rales.  Chest:     Chest wall: No tenderness.  Abdominal:      General: Bowel sounds are normal. There is no distension.     Palpations: Abdomen is soft. There is no mass.     Tenderness: There is no abdominal tenderness. There is no guarding or rebound.  Musculoskeletal:        General: No swelling or tenderness.     Right lower leg: No edema.     Left lower leg: No edema.     Comments: Unsteady gait   Skin:    General: Skin is warm and dry.     Coloration: Skin is not pale.     Findings: No bruising, erythema or rash.     Comments: Red areas around the chin and upper lip red in color without any drainage.   Neurological:     Mental Status: She is alert. She is disoriented.     Gait: Gait abnormal.     Comments: HOH   Psychiatric:        Mood and Affect: Mood normal.        Speech: Speech normal.        Behavior: Behavior normal.        Cognition and Memory: Cognition is impaired. Memory is impaired.    Labs reviewed: Recent Labs    08/12/19 1944 08/13/19 0828 08/16/19 1601 08/16/19 1601 08/17/19 0452 08/18/19 0415 08/25/19 0000  NA 152*   < > 145   < > 144 144 141  K 3.5   < > 3.2*   < > 3.8 4.2 4.3  CL 112*   < > 110   < > 109 108 105  CO2 24   < > 27   < > 29 26 21   GLUCOSE 102*   < > 106*  --  105* 94  --   BUN 45*   < > 23   < > 19 14 26*  CREATININE 1.70*   < > 1.11*   < > 1.09* 1.01* 1.0  CALCIUM 12.1*   < > 10.3   < > 10.8* 10.4* 11.0*  MG 2.5*  --   --   --   --   --   --   PHOS 3.3  --   --   --   --   --   --    < > = values in this interval not displayed.   Recent Labs    08/14/19 0917 08/17/19 0452 08/18/19 0415  AST 21 18 18   ALT 17 16 15   ALKPHOS 77 72 78  BILITOT 0.4 0.5 0.3  PROT 7.4 6.4* 6.5  ALBUMIN 3.8 3.3* 3.3*   Recent Labs    01/28/19 0000 01/28/19 0000 03/27/19 0000 07/15/19 0000 08/12/19 1944 08/13/19 0828 08/15/19 0348 08/15/19 0348 08/16/19 0646 08/17/19 0452 08/25/19 0000  WBC 6.1   < > 6.4   < > 9.6   < > 6.7   < > 6.9 11.9* 5.6  NEUTROABS 4  --  3  --  6.6  --   --   --   --    --   --   HGB 12.0   < > 13.1   < > 11.5*   < > 10.4*   < > 10.8* 11.0* 11.1*  HCT 37   < >  39   < > 37.2   < > 32.9*   < > 34.5* 35.9* 34*  MCV  --   --   --   --  93.0   < > 91.4  --  92.7 93.2  --   PLT 227   < > 218   < > 386   < > 299   < > 264 278 248   < > = values in this interval not displayed.   Lab Results  Component Value Date   TSH 0.59 07/31/2017   Lab Results  Component Value Date   HGBA1C 5.4 06/11/2016   Lab Results  Component Value Date   CHOL 134 07/31/2017   HDL 54 06/11/2016   LDLCALC 61 07/31/2017   TRIG 102 07/31/2017    Significant Diagnostic Results in last 30 days:  No results found.  Assessment/Plan Skin picking habit Afebrile. Red open sore on chin and around the mouth without any signs of infection.patient picking on open areas during visit.No blister noted not suspecting any zoster infection due to presentation of sore not painful  - Bactroban ointment one application to affected areas three times daily  X 7 days.  - continue to monitor  Family/ staff Communication: Reviewed plan of care with patient and facility Nurse   Labs/tests ordered:  None

## 2019-11-11 ENCOUNTER — Encounter: Payer: Self-pay | Admitting: Family

## 2019-11-11 ENCOUNTER — Non-Acute Institutional Stay (SKILLED_NURSING_FACILITY): Payer: Medicare Other | Admitting: Family

## 2019-11-11 DIAGNOSIS — Z66 Do not resuscitate: Secondary | ICD-10-CM | POA: Diagnosis not present

## 2019-11-11 DIAGNOSIS — F319 Bipolar disorder, unspecified: Secondary | ICD-10-CM | POA: Diagnosis not present

## 2019-11-11 DIAGNOSIS — M79605 Pain in left leg: Secondary | ICD-10-CM | POA: Diagnosis not present

## 2019-11-11 DIAGNOSIS — F02818 Dementia in other diseases classified elsewhere, unspecified severity, with other behavioral disturbance: Secondary | ICD-10-CM

## 2019-11-11 DIAGNOSIS — J449 Chronic obstructive pulmonary disease, unspecified: Secondary | ICD-10-CM | POA: Diagnosis not present

## 2019-11-11 DIAGNOSIS — G301 Alzheimer's disease with late onset: Secondary | ICD-10-CM

## 2019-11-11 DIAGNOSIS — F0281 Dementia in other diseases classified elsewhere with behavioral disturbance: Secondary | ICD-10-CM

## 2019-11-11 NOTE — Progress Notes (Signed)
Location:    [place].   Nursing Home Room Number: 304-D Place of Service:  SNF (31) Provider:  Marlowe Sax, NP   Patient Care Team: Gayland Curry, DO as PCP - General (Geriatric Medicine) [name], MD (Gastroenterology) [name], NP (Obstetrics and Gynecology) Rehab, [place] ([place])  Extended Emergency Contact Information Primary Emergency Contact: Taylor Roth [place] of [place] Phone: [phone] Mobile Phone: [phone] Relation: Daughter Secondary Emergency Contact: Taylor Roth [place] of [place] Phone: [phone] Mobile Phone: [phone] Relation: Sister  Code Status:  DNR Goals of care: Advanced Directive information Advanced Directives 11/11/2019  Does Patient Have a Medical Advance Directive? Yes  Type of Paramedic of [place];Out of facility DNR (pink MOST or yellow form)  Does patient want to make changes to medical advance directive? No - Patient declined  Copy of [place] in Chart? Yes - validated most recent copy scanned in chart (See row information)  Would patient like information on creating a medical advance directive? -  Pre-existing out of facility DNR order (yellow form or pink MOST form) Yellow form placed in chart (order not valid for inpatient use)     Chief Complaint  Patient presents with   Medical Management of Chronic Issues    Routine Visit.    Immunizations    Discuss the need for Tetanus Vaccine, and Influenza Vaccine.    HPI:  Pt is a 86 y.o. female seen today for medical management of chronic diseases. She is seen today in her room sitting on her wheelchair.she denies any acute issues though HPI limited due to very Hard of hearing and cognitive impairment.facility Nurse states previous open areas on her chin and around the mouth where patient was picking have resolved with use of  Bactroban prescribed on 10/22/2019.She continue to require  Assistance with her ADL's observed drinking her Yorgurt.she makes her needs known tells me during the visit that she needs to get back in the bed.   COPD - current off her inhalers.Nurse reports no cough,wheezing or shortness of breath.Has not required any oxygen.   Hypertension - B/p readings are stable.off medication.reports no dizziness,palpitation,chest pain or shortness of breath.  Dementia/Bipolar  - on Seroquel 100 mg tablet at bedtime for behavioral issues.also on Paxil 10 mg tablet daily.Staff reports no new behavioral issues.     Past Medical History:  Diagnosis Date   Anxiety    Arterial tortuosity (aorta) 12/11/2012   Asthma    Chronic kidney disease, stage IV (severe) ([place]) 12/11/2012   Chronic mental illness    CKD (chronic kidney disease), stage III 01/14/2015   Closed hip fracture requiring operative repair, right, with routine healing, subsequent encounter 12/19/2015   Colitis    COPD (chronic obstructive pulmonary disease) (HCC)    Decreased rectal sphincter tone 11/21/2012   Depression    Drug-seeking behavior 02/26/2011   Use of multiple md to obtain benzos/narcotics pt must sign contract to receive controlled meds.    Dysphagia 01/11/2015   GERD (gastroesophageal reflux disease)    Glaucoma 06/26/2011   Glaucoma of both eyes    Hearing loss, sensorineural 03/04/2014   [facility]; 02/26/14. There is not a medical or surgical treatment that would benefit her type of hearing loss. We discussed amplification in an overview fashion.     History of fall    Insomnia, unspecified    Macular degeneration 06/26/2011  Migraines    Nasal bone fracture 03/30/2014   Osteoporosis    Osteoporosis 02/26/2011   Presence of right artificial hip joint    Protein-calorie malnutrition, severe ([place]) 12/10/2012   S/P ORIF (open reduction internal fixation) fracture 01/08/2016   Schatzki's ring  01/14/2015   Scoliosis    Substance abuse ([place])    Benbzos and Narcotics, she does not get any of those medicines from [facility], we have empathetically told her that.   Vertigo    Vitamin B deficiency    Vitamin D deficiency 08/25/2016   Weight loss    Past Surgical History:  Procedure Laterality Date   ABDOMINAL HYSTERECTOMY     CATARACT EXTRACTION     CHOLECYSTECTOMY     ESOPHAGOGASTRODUODENOSCOPY (EGD) WITH PROPOFOL N/A 01/13/2015   Procedure: ESOPHAGOGASTRODUODENOSCOPY (EGD) WITH PROPOFOL;  Surgeon: [name], MD;  Location: [facility];  Service: Endoscopy;  Laterality: N/A;   HIP ARTHROPLASTY Right 12/21/2015   Procedure: ARTHROPLASTY BIPOLAR HIP (HEMIARTHROPLASTY);  Surgeon: [name], MD;  Location: [place];  Service: Orthopedics;  Laterality: Right;   PARTIAL HYSTERECTOMY      Allergies  Allergen Reactions   Amoxicillin     Per daughter   Ampicillin     Per daughter   Depakote [Valproic Acid]     DOES NOT WANT HER TO TAKE IT==Makes her hair fall out= per daughter who is poa   Macrobid [Nitrofurantoin Macrocrystal] Rash    Per MAR   Penicillins     Per daughter   Acetaminophen Other (See Comments)    jaundice   Azithromycin Other (See Comments)    Per MAR   Butalbital    Caffeine    Chlorhexidine     Patient does not have central line or foley catheter   Doxycycline Other (See Comments)    Per MAR   Escitalopram Oxalate Other (See Comments)    Per MAR   Fioricet [Butalbital-Apap-Caffeine] Other (See Comments)    Drunk. Per MAR   Latex Other (See Comments)    Per MAR   Levofloxacin Other (See Comments)    Per MAR   Levofloxacin    Nsaids Other (See Comments)    This is not an allergy. The patient was told by Dr. Rockne Menghini to avoid NSAIDs and stop Vicoprofen in to 2014 to avoid nephrotoxicity.  Per MAR.   Oxycodone Other (See Comments)    reaction to synthetic codeine Per MAR   Restasis [Cyclosporine] Other (See  Comments)    Per MAR   Sulfa Antibiotics Other (See Comments)    Per MAR   Biaxin [Clarithromycin] Rash    With burning sensation Per MAR    Allergies as of 11/11/2019      Reactions   Amoxicillin    Per daughter   Ampicillin    Per daughter   Depakote [valproic Acid]    DOES NOT WANT HER TO TAKE IT==Makes her hair fall out= per daughter who is poa   Macrobid [nitrofurantoin Macrocrystal] Rash   Per MAR   Penicillins    Per daughter   Acetaminophen Other (See Comments)   jaundice   Azithromycin Other (See Comments)   Per MAR   Butalbital    Caffeine    Chlorhexidine    Patient does not have central line or foley catheter   Doxycycline Other (See Comments)   Per MAR   Escitalopram Oxalate Other (See Comments)   Per MAR   Fioricet [butalbital-apap-caffeine] Other (See Comments)  Drunk. Per MAR   Latex Other (See Comments)   Per MAR   Levofloxacin Other (See Comments)   Per MAR   Levofloxacin    Nsaids Other (See Comments)   This is not an allergy. The patient was told by Dr. Rockne Menghini to avoid NSAIDs and stop Vicoprofen in to 2014 to avoid nephrotoxicity. Per MAR.   Oxycodone Other (See Comments)   reaction to synthetic codeine Per MAR   Restasis [cyclosporine] Other (See Comments)   Per MAR   Sulfa Antibiotics Other (See Comments)   Per MAR   Biaxin [clarithromycin] Rash   With burning sensation Per [facility]      Medication List       Accurate as of November 11, 2019  3:05 PM. If you have any questions, ask your nurse or doctor.        acetaminophen 500 MG tablet Commonly known as: TYLENOL Give 2 tablets ( 1,000 mg total ) by mouth nightly for pain   ALLEVYN AG SACRUM EX Apply 1 application topically every 30 (thirty) days. Apply protective foam dressing,   dorzolamide-timolol 22.3-6.8 MG/ML ophthalmic solution Commonly known as: COSOPT Place 1 drop into both eyes 2 (two) times daily.   latanoprost 0.005 % ophthalmic solution Commonly known as:  XALATAN Place 1 drop into both eyes at bedtime.   lidocaine 5 % Commonly known as: LIDODERM APPLY 1 PATCH TO ANT. LEFT THIGH DAILY LEAVE ON X 12 HOURS THEN REMOVE   PARoxetine 10 MG tablet Commonly known as: PAXIL Take 10 mg by mouth daily.   QUEtiapine 100 MG tablet Commonly known as: SEROQUEL Take 100 mg by mouth at bedtime. Take along with a 25 mg tablet to = 125 mg qhs   senna 8.6 MG Tabs tablet Commonly known as: SENOKOT Take 2 tablets by mouth daily.   Systane 0.4-0.3 % Soln Generic drug: Polyethyl Glycol-Propyl Glycol Place 2 drops into both eyes every 2 (two) hours as needed (dry eyes).       Review of Systems  Unable to perform ROS: Dementia (additional information provided by facility Nurse )    Immunization History  Administered Date(s) Administered   Influenza Split 11/13/2011   Influenza-Unspecified 02/13/2008, 11/30/2013, 12/20/2016   Moderna SARS-COVID-2 Vaccination 02/12/2019, 03/12/2019   PPD Test 12/15/2015   Pneumococcal Conjugate-13 10/16/2016   Pneumococcal-Unspecified 02/13/2008   Td 02/12/2009   Pertinent  Health Maintenance Due  Topic Date Due   INFLUENZA VACCINE  09/13/2019   DEXA SCAN  Completed   PNA vac Low Risk Adult  Completed   Fall Risk  08/27/2017 08/22/2016  Falls in the past year? No Yes  Number falls in past yr: - 2 or more  Injury with Fall? - Yes  Comment - back of head    Vitals:   11/11/19 1441  BP: (!) 165/70  Pulse: 67  Resp: 18  Temp: 98.3 F (36.8 C)  Weight: 78 lb 3.2 oz (35.5 kg)  Height: 4\' 11"  (1.499 m)   Body mass index is 15.79 kg/m. Physical Exam Vitals and nursing note reviewed.  Constitutional:      General: She is not in acute distress.    Appearance: She is underweight. She is not ill-appearing or diaphoretic.  HENT:     Head: Normocephalic.     Nose: Nose normal. No congestion or rhinorrhea.     Mouth/Throat:     Mouth: Mucous membranes are moist.     Pharynx: Oropharynx is  clear. No oropharyngeal exudate  or posterior oropharyngeal erythema.  Eyes:     General: No scleral icterus.       Right eye: No discharge.        Left eye: No discharge.     Conjunctiva/sclera: Conjunctivae normal.     Pupils: Pupils are equal, round, and reactive to light.  Cardiovascular:     Rate and Rhythm: Normal rate and regular rhythm.     Pulses: Normal pulses.     Heart sounds: Normal heart sounds. No murmur heard.  No friction rub. No gallop.   Pulmonary:     Effort: Pulmonary effort is normal. No respiratory distress.     Breath sounds: Normal breath sounds. No wheezing, rhonchi or rales.  Chest:     Chest wall: No tenderness.  Abdominal:     General: Abdomen is flat. There is no distension.     Palpations: Abdomen is soft. There is no mass.     Tenderness: There is no abdominal tenderness. There is no right CVA tenderness, left CVA tenderness, guarding or rebound.  Musculoskeletal:        General: No swelling or tenderness.     Cervical back: Normal range of motion. No rigidity or tenderness.     Right lower leg: No edema.     Left lower leg: No edema.     Comments: Moves x 4 extremities.   Lymphadenopathy:     Cervical: No cervical adenopathy.  Skin:    General: Skin is warm and dry.     Coloration: Skin is not pale.     Findings: No bruising, erythema or rash.  Neurological:     Mental Status: She is alert. Mental status is at baseline. She is disoriented.     Cranial Nerves: No cranial nerve deficit.     Sensory: No sensory deficit.     Motor: No weakness.     Gait: Gait abnormal.     Comments: HOH  Psychiatric:        Mood and Affect: Mood normal.        Speech: Speech normal.        Behavior: Behavior is cooperative.        Thought Content: Thought content normal.        Cognition and Memory: Memory is impaired. She exhibits impaired recent memory.     Labs reviewed: Recent Labs    08/12/19 1944 08/13/19 0828 08/16/19 1601 08/16/19 1601  08/17/19 0452 08/18/19 0415 08/25/19 0000  NA 152*   < > 145   < > 144 144 141  K 3.5   < > 3.2*   < > 3.8 4.2 4.3  CL 112*   < > 110   < > 109 108 105  CO2 24   < > 27   < > 29 26 21   GLUCOSE 102*   < > 106*  --  105* 94  --   BUN 45*   < > 23   < > 19 14 26*  CREATININE 1.70*   < > 1.11*   < > 1.09* 1.01* 1.0  CALCIUM 12.1*   < > 10.3   < > 10.8* 10.4* 11.0*  MG 2.5*  --   --   --   --   --   --   PHOS 3.3  --   --   --   --   --   --    < > = values in this interval not displayed.  Recent Labs    08/14/19 0917 08/17/19 0452 08/18/19 0415  AST 21 18 18   ALT 17 16 15   ALKPHOS 77 72 78  BILITOT 0.4 0.5 0.3  PROT 7.4 6.4* 6.5  ALBUMIN 3.8 3.3* 3.3*   Recent Labs    01/28/19 0000 01/28/19 0000 03/27/19 0000 07/15/19 0000 08/12/19 1944 08/13/19 0828 08/15/19 0348 08/15/19 0348 08/16/19 0646 08/17/19 0452 08/25/19 0000  WBC 6.1   < > 6.4   < > 9.6   < > 6.7   < > 6.9 11.9* 5.6  NEUTROABS 4  --  3  --  6.6  --   --   --   --   --   --   HGB 12.0   < > 13.1   < > 11.5*   < > 10.4*   < > 10.8* 11.0* 11.1*  HCT 37   < > 39   < > 37.2   < > 32.9*   < > 34.5* 35.9* 34*  MCV  --   --   --   --  93.0   < > 91.4  --  92.7 93.2  --   PLT 227   < > 218   < > 386   < > 299   < > 264 278 248   < > = values in this interval not displayed.   Lab Results  Component Value Date   TSH 0.59 07/31/2017   Lab Results  Component Value Date   HGBA1C 5.4 06/11/2016   Lab Results  Component Value Date   CHOL 134 07/31/2017   HDL 54 06/11/2016   LDLCALC 61 07/31/2017   TRIG 102 07/31/2017    Significant Diagnostic Results in last 30 days:  No results found.  Assessment/Plan 1. Chronic obstructive pulmonary disease, unspecified COPD type ([place]) Breathing stable.off inhalers.  2. Pain of left lower extremity Seems comfortable during visit  - continue on Lidoderm patch to left hip and Tylenol  3. Bipolar disease, chronic (HCC) Mood stable. Continue on Seroquel and Paxil    4. DNR (do not resuscitate) In chart. - Do not attempt resuscitation (DNR)  5. Late onset Alzheimer's disease with behavioral disturbance ([place]) No new behavioral issues reported.Progressive decline expected.  Continue on Seroquel and Paxil   Family/ staff Communication: Reviewed plan of care with patient and facility Nurse   Labs/tests ordered: None

## 2019-12-16 ENCOUNTER — Encounter: Payer: Self-pay | Admitting: Family

## 2019-12-16 ENCOUNTER — Non-Acute Institutional Stay (SKILLED_NURSING_FACILITY): Payer: Medicare Other | Admitting: Family

## 2019-12-16 DIAGNOSIS — F02818 Dementia in other diseases classified elsewhere, unspecified severity, with other behavioral disturbance: Secondary | ICD-10-CM

## 2019-12-16 DIAGNOSIS — N183 Chronic kidney disease, stage 3 unspecified: Secondary | ICD-10-CM

## 2019-12-16 DIAGNOSIS — F0281 Dementia in other diseases classified elsewhere with behavioral disturbance: Secondary | ICD-10-CM

## 2019-12-16 DIAGNOSIS — G301 Alzheimer's disease with late onset: Secondary | ICD-10-CM

## 2019-12-16 DIAGNOSIS — K5901 Slow transit constipation: Secondary | ICD-10-CM

## 2019-12-16 DIAGNOSIS — J449 Chronic obstructive pulmonary disease, unspecified: Secondary | ICD-10-CM | POA: Diagnosis not present

## 2019-12-16 DIAGNOSIS — F319 Bipolar disorder, unspecified: Secondary | ICD-10-CM | POA: Diagnosis not present

## 2019-12-16 NOTE — Progress Notes (Signed)
Location:    [place].   Nursing Home Room Number: 304-D Place of Service:  SNF (31) Provider:  Marlowe Sax, NP     Patient Care Team: Gayland Curry, DO as PCP - General (Geriatric Medicine) [name], MD (Gastroenterology) [name], NP (Obstetrics and Gynecology) [facility], [place] ([place])  Extended Emergency Contact Information Primary Emergency Contact: [name] [place] of [place] Phone: [phone] Mobile Phone: [phone] Relation: Daughter Secondary Emergency Contact: Gwyndolyn Kaufman [place] of [place] Phone: [phone] Mobile Phone: [phone] Relation: Sister  Code Status:  DNR Goals of care: Advanced Directive information Advanced Directives 12/16/2019  Does Patient Have a Medical Advance Directive? Yes  Type of Advance Directive Out of facility DNR (pink MOST or yellow form);Healthcare Power of Attorney  Does patient want to make changes to medical advance directive? No - Patient declined  Copy of [place] in Chart? Yes - validated most recent copy scanned in chart (See row information)  Would patient like information on creating a medical advance directive? -  Pre-existing out of facility DNR order (yellow form or pink MOST form) -     Chief Complaint  Patient presents with  . Medical Management of Chronic Issues    Routine Visit.   . Immunizations    Discuss the need for Influneza Vaccine, and Tetanus Vaccine.    HPI:  Pt is a 86 y.o. female seen today for medical management of chronic diseases.she has a medical history of COPD,Advance Dementia,Failure to thrive,CKD stage 3 ,paranoia,Bipolar, GERD,Osteoporosis among others.she is seen in her room today sitting on her chair.HPI limited due to her cognitive impairment.she makes her needs known to staff.she request to be put back to bed during visit. She had previous weight loss but weight  has stabilize for the past one month.Weight 78.2 lbs ( 10/26/2019) and 80.2 lbs (11/27/2019).Hospice service continues to follow up for failure to thrive and dementia.  She continues to require total care assistance for her ADL's.she eats meals by he self but requires set up due to her poor vision.      Past Medical History:  Diagnosis Date  . Anxiety   . Arterial tortuosity (aorta) 12/11/2012  . Asthma   . Chronic kidney disease, stage IV (severe) ([place]) 12/11/2012  . Chronic mental illness   . CKD (chronic kidney disease), stage III ([place]) 01/14/2015  . Closed hip fracture requiring operative repair, right, with routine healing, subsequent encounter 12/19/2015  . Colitis   . COPD (chronic obstructive pulmonary disease) ([place])   . Decreased rectal sphincter tone 11/21/2012  . Depression   . Drug-seeking behavior 02/26/2011   Use of multiple md to obtain benzos/narcotics pt must sign contract to receive controlled meds.   Marland Kitchen Dysphagia 01/11/2015  . GERD (gastroesophageal reflux disease)   . Glaucoma 06/26/2011  . Glaucoma of both eyes   . Hearing loss, sensorineural 03/04/2014   [facility]; 02/26/14. There is not a medical or surgical treatment that would benefit her type of hearing loss. We discussed amplification in an overview fashion.    Marland Kitchen History of fall   . Insomnia, unspecified   . Macular degeneration 06/26/2011  . Migraines   . Nasal bone fracture 03/30/2014  . Osteoporosis   . Osteoporosis 02/26/2011  . Presence of right artificial hip joint   . Protein-calorie malnutrition, severe ([place]) 12/10/2012  . S/P ORIF (open reduction internal fixation) fracture 01/08/2016  .  Schatzki's ring 01/14/2015  . Scoliosis   . Substance abuse ([place])    Benbzos and Narcotics, she does not get any of those medicines from [facility], we have empathetically told her that.  . Vertigo   . Vitamin B deficiency   . Vitamin D deficiency 08/25/2016  . Weight loss    Past Surgical History:  Procedure  Laterality Date  . ABDOMINAL HYSTERECTOMY    . CATARACT EXTRACTION    . CHOLECYSTECTOMY    . ESOPHAGOGASTRODUODENOSCOPY (EGD) WITH PROPOFOL N/A 01/13/2015   Procedure: ESOPHAGOGASTRODUODENOSCOPY (EGD) WITH PROPOFOL;  Surgeon: [name], MD;  Location: [facility];  Service: Endoscopy;  Laterality: N/A;  . HIP ARTHROPLASTY Right 12/21/2015   Procedure: ARTHROPLASTY BIPOLAR HIP (HEMIARTHROPLASTY);  Surgeon: [name], MD;  Location: [place];  Service: Orthopedics;  Laterality: Right;  . PARTIAL HYSTERECTOMY      Allergies  Allergen Reactions  . Amoxicillin     Per daughter  . Ampicillin     Per daughter  . Depakote [Valproic Acid]     DOES NOT WANT HER TO TAKE IT==Makes her hair fall out= per daughter who is poa  . Macrobid [Nitrofurantoin Macrocrystal] Rash    Per MAR  . Penicillins     Per daughter  . Acetaminophen Other (See Comments)    jaundice  . Azithromycin Other (See Comments)    Per MAR  . Butalbital   . Caffeine   . Chlorhexidine     Patient does not have central line or foley catheter  . Doxycycline Other (See Comments)    Per MAR  . Escitalopram Oxalate Other (See Comments)    Per MAR  . Fioricet [Butalbital-Apap-Caffeine] Other (See Comments)    Drunk. Per MAR  . Latex Other (See Comments)    Per MAR  . Levofloxacin Other (See Comments)    Per MAR  . Levofloxacin   . Nsaids Other (See Comments)    This is not an allergy. The patient was told by Dr. Rockne Menghini to avoid NSAIDs and stop Vicoprofen in to 2014 to avoid nephrotoxicity.  Per MAR.  Marland Kitchen Oxycodone Other (See Comments)    reaction to synthetic codeine Per MAR  . Restasis [Cyclosporine] Other (See Comments)    Per MAR  . Sulfa Antibiotics Other (See Comments)    Per MAR  . Biaxin [Clarithromycin] Rash    With burning sensation Per MAR    Allergies as of 12/16/2019      Reactions   Amoxicillin    Per daughter   Ampicillin    Per daughter   Depakote [valproic Acid]    DOES NOT WANT  HER TO TAKE IT==Makes her hair fall out= per daughter who is poa   Macrobid [nitrofurantoin Macrocrystal] Rash   Per MAR   Penicillins    Per daughter   Acetaminophen Other (See Comments)   jaundice   Azithromycin Other (See Comments)   Per MAR   Butalbital    Caffeine    Chlorhexidine    Patient does not have central line or foley catheter   Doxycycline Other (See Comments)   Per MAR   Escitalopram Oxalate Other (See Comments)   Per MAR   Fioricet [butalbital-apap-caffeine] Other (See Comments)   Drunk. Per MAR   Latex Other (See Comments)   Per MAR   Levofloxacin Other (See Comments)   Per MAR   Levofloxacin    Nsaids Other (See Comments)   This is not an allergy. The patient was  told by Dr. Rockne Menghini to avoid NSAIDs and stop Vicoprofen in to 2014 to avoid nephrotoxicity. Per MAR.   Oxycodone Other (See Comments)   reaction to synthetic codeine Per MAR   Restasis [cyclosporine] Other (See Comments)   Per MAR   Sulfa Antibiotics Other (See Comments)   Per MAR   Biaxin [clarithromycin] Rash   With burning sensation Per [facility]      Medication List       Accurate as of December 16, 2019 11:09 AM. If you have any questions, ask your nurse or doctor.        STOP taking these medications   ALLEVYN AG SACRUM EX Stopped by: Sandrea Hughs, NP     TAKE these medications   acetaminophen 500 MG tablet Commonly known as: TYLENOL Give 2 tablets ( 1,000 mg total ) by mouth nightly for pain   dorzolamide-timolol 22.3-6.8 MG/ML ophthalmic solution Commonly known as: COSOPT Place 1 drop into both eyes 2 (two) times daily.   latanoprost 0.005 % ophthalmic solution Commonly known as: XALATAN Place 1 drop into both eyes at bedtime.   lidocaine 5 % Commonly known as: LIDODERM APPLY 1 PATCH TO ANT. LEFT THIGH DAILY LEAVE ON X 12 HOURS THEN REMOVE   PARoxetine 10 MG tablet Commonly known as: PAXIL Take 10 mg by mouth daily.   QUEtiapine 100 MG tablet Commonly known as:  SEROQUEL Take 100 mg by mouth at bedtime.   senna 8.6 MG Tabs tablet Commonly known as: SENOKOT Take 2 tablets by mouth daily.   Systane 0.4-0.3 % Soln Generic drug: Polyethyl Glycol-Propyl Glycol Place 2 drops into both eyes every 2 (two) hours as needed (dry eyes).       Review of Systems  Unable to perform ROS: Dementia    Immunization History  Administered Date(s) Administered  . Influenza Split 11/13/2011  . Influenza-Unspecified 02/13/2008, 11/30/2013, 12/20/2016  . Moderna SARS-COVID-2 Vaccination 02/12/2019, 03/12/2019  . PPD Test 12/15/2015  . Pneumococcal Conjugate-13 10/16/2016  . Pneumococcal-Unspecified 02/13/2008  . Td 02/12/2009   Pertinent  Health Maintenance Due  Topic Date Due  . INFLUENZA VACCINE  09/13/2019  . DEXA SCAN  Completed  . PNA vac Low Risk Adult  Completed   Fall Risk  08/27/2017 08/22/2016  Falls in the past year? No Yes  Number falls in past yr: - 2 or more  Injury with Fall? - Yes  Comment - back of head   Functional Status Survey:    Vitals:   12/16/19 1102  BP: 135/61  Pulse: 63  Resp: 18  Temp: (!) 97.3 F (36.3 C)  Weight: 80 lb 3.2 oz (36.4 kg)  Height: 4\' 11"  (1.499 m)   Body mass index is 16.2 kg/m. Physical Exam Vitals and nursing note reviewed.  Constitutional:      General: She is not in acute distress.    Appearance: She is underweight. She is not ill-appearing.     Comments: Frail Elderly   HENT:     Head: Normocephalic.     Nose: Nose normal. No congestion.     Mouth/Throat:     Mouth: Mucous membranes are moist.     Pharynx: Oropharynx is clear. No oropharyngeal exudate or posterior oropharyngeal erythema.  Eyes:     General: No scleral icterus.       Right eye: No discharge.        Left eye: No discharge.     Conjunctiva/sclera: Conjunctivae normal.     Comments: Legally  blind   Cardiovascular:     Rate and Rhythm: Normal rate and regular rhythm.     Pulses: Normal pulses.     Heart sounds:  Normal heart sounds. No murmur heard.  No friction rub. No gallop.   Pulmonary:     Effort: Pulmonary effort is normal. No respiratory distress.     Breath sounds: Normal breath sounds. No wheezing, rhonchi or rales.  Chest:     Chest wall: No tenderness.  Abdominal:     General: Bowel sounds are normal. There is no distension.     Palpations: Abdomen is soft. There is no mass.     Tenderness: There is no abdominal tenderness. There is no right CVA tenderness, left CVA tenderness, guarding or rebound.  Musculoskeletal:     Cervical back: Normal range of motion. No rigidity or tenderness.  Lymphadenopathy:     Cervical: No cervical adenopathy.  Neurological:     Mental Status: She is alert.  Psychiatric:        Mood and Affect: Mood normal.        Speech: Speech normal.        Behavior: Behavior is cooperative.        Thought Content: Thought content normal.        Cognition and Memory: Cognition is impaired. Memory is impaired.     Labs reviewed: Recent Labs    08/12/19 1944 08/13/19 0828 08/16/19 1601 08/16/19 1601 08/17/19 0452 08/18/19 0415 08/25/19 0000  NA 152*   < > 145   < > 144 144 141  K 3.5   < > 3.2*   < > 3.8 4.2 4.3  CL 112*   < > 110   < > 109 108 105  CO2 24   < > 27   < > 29 26 21   GLUCOSE 102*   < > 106*  --  105* 94  --   BUN 45*   < > 23   < > 19 14 26*  CREATININE 1.70*   < > 1.11*   < > 1.09* 1.01* 1.0  CALCIUM 12.1*   < > 10.3   < > 10.8* 10.4* 11.0*  MG 2.5*  --   --   --   --   --   --   PHOS 3.3  --   --   --   --   --   --    < > = values in this interval not displayed.   Recent Labs    08/14/19 0917 08/17/19 0452 08/18/19 0415  AST 21 18 18   ALT 17 16 15   ALKPHOS 77 72 78  BILITOT 0.4 0.5 0.3  PROT 7.4 6.4* 6.5  ALBUMIN 3.8 3.3* 3.3*   Recent Labs    01/28/19 0000 01/28/19 0000 03/27/19 0000 07/15/19 0000 08/12/19 1944 08/13/19 0828 08/15/19 0348 08/15/19 0348 08/16/19 0646 08/17/19 0452 08/25/19 0000  WBC 6.1   < >  6.4   < > 9.6   < > 6.7   < > 6.9 11.9* 5.6  NEUTROABS 4  --  3  --  6.6  --   --   --   --   --   --   HGB 12.0   < > 13.1   < > 11.5*   < > 10.4*   < > 10.8* 11.0* 11.1*  HCT 37   < > 39   < > 37.2   < > 32.9*   < >  34.5* 35.9* 34*  MCV  --   --   --   --  93.0   < > 91.4  --  92.7 93.2  --   PLT 227   < > 218   < > 386   < > 299   < > 264 278 248   < > = values in this interval not displayed.   Lab Results  Component Value Date   TSH 0.59 07/31/2017   Lab Results  Component Value Date   HGBA1C 5.4 06/11/2016   Lab Results  Component Value Date   CHOL 134 07/31/2017   HDL 54 06/11/2016   LDLCALC 61 07/31/2017   TRIG 102 07/31/2017    Significant Diagnostic Results in last 30 days:  No results found.  Assessment/Plan 1. Chronic obstructive pulmonary disease, unspecified COPD type ([place]) Breathing stable on room air. Continue to monitor.  2. Stage 3 chronic kidney disease, unspecified whether stage 3a or 3b CKD (HCC) Latest CR 1.0 at baseline. Continue to avoid Nephrotoxin and dose all other medication for renal clearance.   3. Late onset Alzheimer's disease with behavioral disturbance ([place]) No new behavioral issues reported by staff. Continue on Paxil and Seroquel  - continue with supportive care  - weight has improved  - continue on Hospice service   4. Bipolar disease, chronic (HCC) Mood stable. Continue on Paxil and Seroquel   5. Slow transit constipation Senna and Miralax effective. Continue to encourage Hydration and oral intake.   Family/ staff Communication: Reviewed plan of care with patient and facility Nurse   Labs/tests ordered: None

## 2019-12-23 ENCOUNTER — Other Ambulatory Visit: Payer: Self-pay | Admitting: Family

## 2019-12-23 DIAGNOSIS — F22 Delusional disorders: Secondary | ICD-10-CM

## 2019-12-23 DIAGNOSIS — F0281 Dementia in other diseases classified elsewhere with behavioral disturbance: Secondary | ICD-10-CM

## 2019-12-23 DIAGNOSIS — G301 Alzheimer's disease with late onset: Secondary | ICD-10-CM

## 2019-12-23 DIAGNOSIS — F319 Bipolar disorder, unspecified: Secondary | ICD-10-CM

## 2019-12-23 MED ORDER — QUETIAPINE FUMARATE 100 MG PO TABS: 100.0000 mg | ORAL_TABLET | Freq: Every day | ORAL | 3 refills | Status: DC

## 2019-12-23 NOTE — Progress Notes (Signed)
Seroquel refilled per facility Nurse request.

## 2020-01-14 ENCOUNTER — Non-Acute Institutional Stay (SKILLED_NURSING_FACILITY): Payer: Medicare Other | Admitting: Orthopedic Surgery

## 2020-01-14 ENCOUNTER — Encounter: Payer: Self-pay | Admitting: Orthopedic Surgery

## 2020-01-14 DIAGNOSIS — J449 Chronic obstructive pulmonary disease, unspecified: Secondary | ICD-10-CM

## 2020-01-14 DIAGNOSIS — K5901 Slow transit constipation: Secondary | ICD-10-CM

## 2020-01-14 DIAGNOSIS — N183 Chronic kidney disease, stage 3 unspecified: Secondary | ICD-10-CM

## 2020-01-14 DIAGNOSIS — F319 Bipolar disorder, unspecified: Secondary | ICD-10-CM

## 2020-01-14 DIAGNOSIS — F0281 Dementia in other diseases classified elsewhere with behavioral disturbance: Secondary | ICD-10-CM

## 2020-01-14 DIAGNOSIS — F02818 Dementia in other diseases classified elsewhere, unspecified severity, with other behavioral disturbance: Secondary | ICD-10-CM

## 2020-01-14 DIAGNOSIS — R627 Adult failure to thrive: Secondary | ICD-10-CM | POA: Diagnosis not present

## 2020-01-14 DIAGNOSIS — G301 Alzheimer's disease with late onset: Secondary | ICD-10-CM | POA: Diagnosis not present

## 2020-01-14 NOTE — Progress Notes (Signed)
Location:    [facility] Room Number: 304/D Place of Service:  SNF [id number]) Provider: Windell Moulding NP   Gayland Curry, DO  Patient Care Team: Gayland Curry, DO as PCP - General (Geriatric Medicine) [name], MD (Gastroenterology) [name], NP (Obstetrics and Gynecology) Re[facility], [facility] ([place])  Extended Emergency Contact Information Primary Emergency Contact: [name] [place] of [place] Phone: [phone] Mobile Phone: [phone] Relation: Daughter Secondary Emergency Contact: Gwyndolyn Kaufman [place] of [place] Phone: [phone] Mobile Phone: [phone] Relation: Sister  Code Status:  DNR Goals of care: Advanced Directive information Advanced Directives 01/14/2020  Does Patient Have a Medical Advance Directive? Yes  Type of Paramedic of [place];Out of facility DNR (pink MOST or yellow form)  Does patient want to make changes to medical advance directive? No - Patient declined  Copy of [place] in Chart? Yes - validated most recent copy scanned in chart (See row information)  Would patient like information on creating a medical advance directive? -  Pre-existing out of facility DNR order (yellow form or pink MOST form) Yellow form placed in chart (order not valid for inpatient use)     Chief Complaint  Patient presents with  . Medical Management of Chronic Issues    Routine Visit of Medical Management  . Immunizations    T-Dap, Influenza Vaccine    HPI:  Pt is a 86 y.o. female seen today for medical management of chronic diseases.    She is a resident of [facility], seen at bedside today. PMH includes: orthostatic hypotension, hypertension, COPD, GERD, dysphagia, chronic kidney disease stage III, advanced dementia and failure to thrive.   Today she is in her wheelchair next to bed.  Encounter is limited due to her cognitive impairment. Mostly non-verbal during visit. I offered her a drink, she took one sip, and said "no more."  Does not follow commands during exam.   Continues to require assistance for transfers and ADL's. Facility nurse denies falls.   She has maintained her weight within the past month.  80.2 lbs- 11/27/19 81.4 lbs- 12/22/19  Received her Moderna booster vaccine 12/31/19, no reaction reported.   Daughter contacted about upcoming podiatry and dental clinic, does not think it is necessary for her mother at this time.   Facility nurse does not report any concerns. No recent behavioral outbursts. Vital signs stable.   Past Medical History:  Diagnosis Date  . Anxiety   . Arterial tortuosity (aorta) 12/11/2012  . Asthma   . Chronic kidney disease, stage IV (severe) ([place]) 12/11/2012  . Chronic mental illness   . CKD (chronic kidney disease), stage III ([place]) 01/14/2015  . Closed hip fracture requiring operative repair, right, with routine healing, subsequent encounter 12/19/2015  . Colitis   . COPD (chronic obstructive pulmonary disease) ([place])   . Decreased rectal sphincter tone 11/21/2012  . Depression   . Drug-seeking behavior 02/26/2011   Use of multiple md to obtain benzos/narcotics pt must sign contract to receive controlled meds.   Marland Kitchen Dysphagia 01/11/2015  . GERD (gastroesophageal reflux disease)   . Glaucoma 06/26/2011  . Glaucoma of both eyes   . Hearing loss, sensorineural 03/04/2014   [facility]; 02/26/14. There is not a medical or surgical treatment that would benefit her type of hearing loss. We discussed amplification in an overview fashion.    Marland Kitchen History of  fall   . Insomnia, unspecified   . Macular degeneration 06/26/2011  . Migraines   . Nasal bone fracture 03/30/2014  . Osteoporosis   . Osteoporosis 02/26/2011  . Presence of right artificial hip joint   . Protein-calorie malnutrition, severe ([place]) 12/10/2012  . S/P ORIF (open  reduction internal fixation) fracture 01/08/2016  . Schatzki's ring 01/14/2015  . Scoliosis   . Substance abuse ([place])    Benbzos and Narcotics, she does not get any of those medicines from [facility], we have empathetically told her that.  . Vertigo   . Vitamin B deficiency   . Vitamin D deficiency 08/25/2016  . Weight loss    Past Surgical History:  Procedure Laterality Date  . ABDOMINAL HYSTERECTOMY    . CATARACT EXTRACTION    . CHOLECYSTECTOMY    . ESOPHAGOGASTRODUODENOSCOPY (EGD) WITH PROPOFOL N/A 01/13/2015   Procedure: ESOPHAGOGASTRODUODENOSCOPY (EGD) WITH PROPOFOL;  Surgeon: [name], MD;  Location: [facility];  Service: Endoscopy;  Laterality: N/A;  . HIP ARTHROPLASTY Right 12/21/2015   Procedure: ARTHROPLASTY BIPOLAR HIP (HEMIARTHROPLASTY);  Surgeon: [name], MD;  Location: [facility];  Service: Orthopedics;  Laterality: Right;  . PARTIAL HYSTERECTOMY      Allergies  Allergen Reactions  . Amoxicillin     Per daughter  . Ampicillin     Per daughter  . Depakote [Valproic Acid]     DOES NOT WANT HER TO TAKE IT==Makes her hair fall out= per daughter who is poa  . Macrobid [Nitrofurantoin Macrocrystal] Rash    Per MAR  . Penicillins     Per daughter  . Acetaminophen Other (See Comments)    jaundice  . Azithromycin Other (See Comments)    Per MAR  . Butalbital   . Caffeine   . Chlorhexidine     Patient does not have central line or foley catheter  . Doxycycline Other (See Comments)    Per MAR  . Escitalopram Oxalate Other (See Comments)    Per MAR  . Fioricet [Butalbital-Apap-Caffeine] Other (See Comments)    Drunk. Per MAR  . Latex Other (See Comments)    Per MAR  . Levofloxacin Other (See Comments)    Per MAR  . Levofloxacin   . Nsaids Other (See Comments)    This is not an allergy. The patient was told by Dr. Rockne Menghini to avoid NSAIDs and stop Vicoprofen in to 2014 to avoid nephrotoxicity.  Per MAR.  Marland Kitchen Oxycodone Other (See Comments)    reaction to  synthetic codeine Per MAR  . Restasis [Cyclosporine] Other (See Comments)    Per MAR  . Sulfa Antibiotics Other (See Comments)    Per MAR  . Biaxin [Clarithromycin] Rash    With burning sensation Per MAR    Allergies as of 01/14/2020      Reactions   Amoxicillin    Per daughter   Ampicillin    Per daughter   Depakote [valproic Acid]    DOES NOT WANT HER TO TAKE IT==Makes her hair fall out= per daughter who is poa   Macrobid [nitrofurantoin Macrocrystal] Rash   Per MAR   Penicillins    Per daughter   Acetaminophen Other (See Comments)   jaundice   Azithromycin Other (See Comments)   Per MAR   Butalbital    Caffeine    Chlorhexidine    Patient does not have central line or foley catheter   Doxycycline Other (See Comments)   Per MAR   Escitalopram Oxalate Other (  See Comments)   Per MAR   Fioricet [butalbital-apap-caffeine] Other (See Comments)   Drunk. Per MAR   Latex Other (See Comments)   Per MAR   Levofloxacin Other (See Comments)   Per MAR   Levofloxacin    Nsaids Other (See Comments)   This is not an allergy. The patient was told by Dr. Rockne Menghini to avoid NSAIDs and stop Vicoprofen in to 2014 to avoid nephrotoxicity. Per MAR.   Oxycodone Other (See Comments)   reaction to synthetic codeine Per MAR   Restasis [cyclosporine] Other (See Comments)   Per MAR   Sulfa Antibiotics Other (See Comments)   Per MAR   Biaxin [clarithromycin] Rash   With burning sensation Per [facility]      Medication List       Accurate as of January 14, 2020  3:57 PM. If you have any questions, ask your nurse or doctor.        acetaminophen 500 MG tablet Commonly known as: TYLENOL Give 2 tablets ( 1,000 mg total ) by mouth nightly for pain   dorzolamide-timolol 22.3-6.8 MG/ML ophthalmic solution Commonly known as: COSOPT Place 1 drop into both eyes 2 (two) times daily.   latanoprost 0.005 % ophthalmic solution Commonly known as: XALATAN Place 1 drop into both eyes at bedtime.     lidocaine 5 % Commonly known as: LIDODERM APPLY 1 PATCH TO ANT. LEFT THIGH DAILY LEAVE ON X 12 HOURS THEN REMOVE   NON FORMULARY Magic Cup with meals.   PARoxetine 10 MG tablet Commonly known as: PAXIL Take 10 mg by mouth daily.   QUEtiapine 100 MG tablet Commonly known as: SEROQUEL Take 100 mg by mouth daily.   senna 8.6 MG Tabs tablet Commonly known as: SENOKOT Take 2 tablets by mouth daily.   Systane 0.4-0.3 % Soln Generic drug: Polyethyl Glycol-Propyl Glycol Place 2 drops into both eyes every 2 (two) hours as needed (dry eyes).       Review of Systems  Unable to perform ROS: Dementia    Immunization History  Administered Date(s) Administered  . Influenza Split 11/13/2011  . Influenza-Unspecified 02/13/2008, 11/30/2013, 12/20/2016  . Moderna SARS-COVID-2 Vaccination 02/12/2019, 03/12/2019, 12/31/2019  . PPD Test 12/15/2015  . Pneumococcal Conjugate-13 10/16/2016  . Pneumococcal-Unspecified 02/13/2008  . Td 02/12/2009   Pertinent  Health Maintenance Due  Topic Date Due  . INFLUENZA VACCINE  09/13/2019  . DEXA SCAN  Completed  . PNA vac Low Risk Adult  Completed   Fall Risk  08/27/2017 08/22/2016  Falls in the past year? No Yes  Number falls in past yr: - 2 or more  Injury with Fall? - Yes  Comment - back of head   Functional Status Survey:    Vitals:   01/14/20 1551  BP: 92/60  Pulse: 71  Resp: 17  Temp: (!) 97.2 F (36.2 C)  Weight: 81 lb 6.4 oz (36.9 kg)  Height: 4\' 11"  (1.499 m)   Body mass index is 16.44 kg/m. Physical Exam Constitutional:      General: She is awake.     Appearance: She is underweight.  HENT:     Head: Normocephalic.     Comments: Temporal wasting    Nose: Nose normal.     Mouth/Throat:     Mouth: Mucous membranes are moist.  Eyes:     General:        Right eye: No discharge.        Left eye: No discharge.  Pupils: Pupils are equal, round, and reactive to light.     Comments: Legally blind  Cardiovascular:      Rate and Rhythm: Normal rate and regular rhythm.     Pulses: Normal pulses.     Heart sounds: Normal heart sounds. No murmur heard.   Pulmonary:     Effort: Pulmonary effort is normal. No respiratory distress.     Breath sounds: Normal breath sounds. No wheezing.  Abdominal:     General: Abdomen is flat.     Palpations: Abdomen is soft.     Comments: Hypoactive bowel sounds  Musculoskeletal:     Right lower leg: No edema.     Left lower leg: No edema.  Lymphadenopathy:     Cervical: No cervical adenopathy.  Skin:    General: Skin is warm and dry.     Capillary Refill: Capillary refill takes 2 to 3 seconds.  Neurological:     Mental Status: She is disoriented.     Motor: Weakness present.     Gait: Gait abnormal.  Psychiatric:        Speech: Speech is delayed.        Cognition and Memory: Cognition is impaired. Memory is impaired.     Labs reviewed: Recent Labs    08/12/19 1944 08/13/19 0828 08/16/19 1601 08/16/19 1601 08/17/19 0452 08/18/19 0415 08/25/19 0000  NA 152*   < > 145   < > 144 144 141  K 3.5   < > 3.2*   < > 3.8 4.2 4.3  CL 112*   < > 110   < > 109 108 105  CO2 24   < > 27   < > 29 26 21   GLUCOSE 102*   < > 106*  --  105* 94  --   BUN 45*   < > 23   < > 19 14 26*  CREATININE 1.70*   < > 1.11*   < > 1.09* 1.01* 1.0  CALCIUM 12.1*   < > 10.3   < > 10.8* 10.4* 11.0*  MG 2.5*  --   --   --   --   --   --   PHOS 3.3  --   --   --   --   --   --    < > = values in this interval not displayed.   Recent Labs    08/14/19 0917 08/17/19 0452 08/18/19 0415  AST 21 18 18   ALT 17 16 15   ALKPHOS 77 72 78  BILITOT 0.4 0.5 0.3  PROT 7.4 6.4* 6.5  ALBUMIN 3.8 3.3* 3.3*   Recent Labs    01/28/19 0000 01/28/19 0000 03/27/19 0000 07/15/19 0000 08/12/19 1944 08/13/19 0828 08/15/19 0348 08/15/19 0348 08/16/19 0646 08/17/19 0452 08/25/19 0000  WBC 6.1   < > 6.4   < > 9.6   < > 6.7   < > 6.9 11.9* 5.6  NEUTROABS 4  --  3  --  6.6  --   --   --   --    --   --   HGB 12.0   < > 13.1   < > 11.5*   < > 10.4*   < > 10.8* 11.0* 11.1*  HCT 37   < > 39   < > 37.2   < > 32.9*   < > 34.5* 35.9* 34*  MCV  --   --   --   --  93.0   < > 91.4  --  92.7 93.2  --   PLT 227   < > 218   < > 386   < > 299   < > 264 278 248   < > = values in this interval not displayed.   Lab Results  Component Value Date   TSH 0.59 07/31/2017   Lab Results  Component Value Date   HGBA1C 5.4 06/11/2016   Lab Results  Component Value Date   CHOL 134 07/31/2017   HDL 54 06/11/2016   LDLCALC 61 07/31/2017   TRIG 102 07/31/2017    Significant Diagnostic Results in last 30 days:  No results found.  Assessment/Plan 1. Late onset Alzheimer's disease with behavioral disturbance ([place]) - stable with no new behavioral issues, mobility remains the same - continue Paxil and Seroquel - continue hospice   2. Stage 3 chronic kidney disease, unspecified whether stage 3a or 3b CKD ([place]) - avoid nephrotoxic medications and dose adjust medications for renal excretion - cbc/diff - cmp  3. Chronic obstructive pulmonary disease, unspecified COPD type ([place]) - stable on room air - continue to monitor  4. Adult failure to thrive -ongoing, appears frail and high risk for falling -continue fall risk plan  5. Slow transit constipation - having 1-2 BM weekly - continue senna - encourage hydration  6. Bipolar disease, chronic ([facility]) - stable, no behavioral outbursts - continue Paxil and seroquel   Family/ staff Communication: Plan discussed with facility nurse  Labs/tests ordered:  Cbc/diff, cmp

## 2020-01-18 LAB — HEPATIC FUNCTION PANEL
ALT: 15 (ref 7–35)
AST: 25 (ref 13–35)
Alkaline Phosphatase: 130 — AB (ref 25–125)
Bilirubin, Total: 0.4

## 2020-01-18 LAB — BASIC METABOLIC PANEL
BUN: 17 (ref 4–21)
CO2: 23 — AB (ref 13–22)
Chloride: 106 (ref 99–108)
Creatinine: 0.5 (ref 0.5–1.1)
Glucose: 96
Potassium: 4 (ref 3.4–5.3)
Sodium: 143 (ref 137–147)

## 2020-01-18 LAB — COMPREHENSIVE METABOLIC PANEL
Albumin: 4.1 (ref 3.5–5.0)
Calcium: 9.7 (ref 8.7–10.7)
GFR calc Af Amer: >90
GFR calc non Af Amer: 84.89
Globulin: 2.6

## 2020-03-22 ENCOUNTER — Encounter: Payer: Self-pay | Admitting: Orthopedic Surgery

## 2020-03-22 ENCOUNTER — Non-Acute Institutional Stay (SKILLED_NURSING_FACILITY): Payer: Medicare Other | Admitting: Orthopedic Surgery

## 2020-03-22 DIAGNOSIS — R627 Adult failure to thrive: Secondary | ICD-10-CM | POA: Diagnosis not present

## 2020-03-22 DIAGNOSIS — G301 Alzheimer's disease with late onset: Secondary | ICD-10-CM | POA: Diagnosis not present

## 2020-03-22 DIAGNOSIS — N183 Chronic kidney disease, stage 3 unspecified: Secondary | ICD-10-CM

## 2020-03-22 DIAGNOSIS — F02818 Dementia in other diseases classified elsewhere, unspecified severity, with other behavioral disturbance: Secondary | ICD-10-CM

## 2020-03-22 DIAGNOSIS — F0281 Dementia in other diseases classified elsewhere with behavioral disturbance: Secondary | ICD-10-CM

## 2020-03-22 DIAGNOSIS — K5901 Slow transit constipation: Secondary | ICD-10-CM

## 2020-03-22 DIAGNOSIS — J449 Chronic obstructive pulmonary disease, unspecified: Secondary | ICD-10-CM

## 2020-03-22 DIAGNOSIS — F424 Excoriation (skin-picking) disorder: Secondary | ICD-10-CM

## 2020-03-22 NOTE — Progress Notes (Signed)
Location:    [facility] Room Number: 304/D Place of Service:  SNF [phone]) Provider:  Windell Moulding NP  Gayland Curry, DO  Patient Care Team: Gayland Curry, DO as PCP - General (Geriatric Medicine) [name], MD (Gastroenterology) [name], NP (Obstetrics and Gynecology) Re[facility], [facility] ([place])  Extended Emergency Contact Information Primary Emergency Contact: [name] [place] of [place] Phone: [phone] Mobile Phone: [phone] Relation: Daughter Secondary Emergency Contact: Gwyndolyn Kaufman [place] of [place] Phone: [phone] Mobile Phone: [phone] Relation: Sister  Code Status: DNR  Goals of care: Advanced Directive information Advanced Directives 03/22/2020  Does Patient Have a Medical Advance Directive? Yes  Type of [facility] of [place];Out of facility DNR (pink MOST or yellow form)  Does patient want to make changes to medical advance directive? No - Patient declined  Copy of Anon Raices in Chart? Yes - validated most recent copy scanned in chart (See row information)  Would patient like information on creating a medical advance directive? -  Pre-existing out of facility DNR order (yellow form or pink MOST form) Yellow form placed in chart (order not valid for inpatient use)     Chief Complaint  Patient presents with  . Medical Management of Chronic Issues    Routine Visit of Medical Management   . Immunizations    Discuss the need for Influenza Vaccine and T-Dap    HPI:  Pt is a 87 y.o. female seen today for medical management of chronic diseases.    She is a resident of [facility], seen today. PMH includes: hypotension, hypertension, COPD, GERD, dysphagia, CKD stage III, advanced dementia and FOT.   Today she is sitting in her wheelchair. Remains on Hospice service. Non-verbal  today. Does not follow commands. When I try to hold her hand she swings it away. Recent BIMS 3/15. Paxil increased to 20 mg due to picking habit that has increased in last few weeks.   Continues to require assistance with all ADL's.   No recent falls, injuries or behavioral outbursts.   Recent blood pressures are as follows:  02/07- 117/59  02/06- 122/56  02/05- 109/67  Recent weight are as follows:  Feb- will be taken in middle of month  01/13- 78.4 lbs  12/15- 80.2 lbs  Facility nurse does not report any concerns, vitals stable.    Past Medical History:  Diagnosis Date  . Anxiety   . Arterial tortuosity (aorta) 12/11/2012  . Asthma   . Chronic kidney disease, stage IV (severe) ([place]) 12/11/2012  . Chronic mental illness   . CKD (chronic kidney disease), stage III ([place]) 01/14/2015  . Closed hip fracture requiring operative repair, right, with routine healing, subsequent encounter 12/19/2015  . Colitis   . COPD (chronic obstructive pulmonary disease) ([place])   . Decreased rectal sphincter tone 11/21/2012  . Depression   . Drug-seeking behavior 02/26/2011   Use of multiple md to obtain benzos/narcotics pt must sign contract to receive controlled meds.   Marland Kitchen Dysphagia 01/11/2015  . GERD (gastroesophageal reflux disease)   . Glaucoma 06/26/2011  . Glaucoma of both eyes   . Hearing loss, sensorineural 03/04/2014   [facility] ENT; 02/26/14. There is not a medical or surgical treatment that would benefit her type of hearing loss. We discussed amplification in an overview fashion.    Marland Kitchen History of fall   . Insomnia, unspecified   .  Macular degeneration 06/26/2011  . Migraines   . Nasal bone fracture 03/30/2014  . Osteoporosis   . Osteoporosis 02/26/2011  . Presence of right artificial hip joint   . Protein-calorie malnutrition, severe ([place]) 12/10/2012  . S/P ORIF (open reduction internal fixation) fracture 01/08/2016  . Schatzki's ring 01/14/2015  . Scoliosis   . Substance abuse ([place])     Benbzos and Narcotics, she does not get any of those medicines from [facility], we have empathetically told her that.  . Vertigo   . Vitamin B deficiency   . Vitamin D deficiency 08/25/2016  . Weight loss    Past Surgical History:  Procedure Laterality Date  . ABDOMINAL HYSTERECTOMY    . CATARACT EXTRACTION    . CHOLECYSTECTOMY    . ESOPHAGOGASTRODUODENOSCOPY (EGD) WITH PROPOFOL N/A 01/13/2015   Procedure: ESOPHAGOGASTRODUODENOSCOPY (EGD) WITH PROPOFOL;  Surgeon: [name], MD;  Location: [facility];  Service: Endoscopy;  Laterality: N/A;  . HIP ARTHROPLASTY Right 12/21/2015   Procedure: ARTHROPLASTY BIPOLAR HIP (HEMIARTHROPLASTY);  Surgeon: [name], MD;  Location: [facility];  Service: Orthopedics;  Laterality: Right;  . PARTIAL HYSTERECTOMY      Allergies  Allergen Reactions  . Amoxicillin     Per daughter  . Ampicillin     Per daughter  . Depakote [Valproic Acid]     DOES NOT WANT HER TO TAKE IT==Makes her hair fall out= per daughter who is poa  . Macrobid [Nitrofurantoin Macrocrystal] Rash    Per MAR  . Penicillins     Per daughter  . Acetaminophen Other (See Comments)    jaundice  . Azithromycin Other (See Comments)    Per MAR  . Butalbital   . Caffeine   . Chlorhexidine     Patient does not have central line or foley catheter  . Doxycycline Other (See Comments)    Per MAR  . Escitalopram Oxalate Other (See Comments)    Per MAR  . Fioricet [Butalbital-Apap-Caffeine] Other (See Comments)    Drunk. Per MAR  . Latex Other (See Comments)    Per MAR  . Levofloxacin Other (See Comments)    Per MAR  . Levofloxacin   . Nsaids Other (See Comments)    This is not an allergy. The patient was told by Dr. Rockne Menghini to avoid NSAIDs and stop Vicoprofen in to 2014 to avoid nephrotoxicity.  Per MAR.  Marland Kitchen Oxycodone Other (See Comments)    reaction to synthetic codeine Per MAR  . Restasis [Cyclosporine] Other (See Comments)    Per MAR  . Sulfa Antibiotics Other (See  Comments)    Per MAR  . Biaxin [Clarithromycin] Rash    With burning sensation Per MAR    Allergies as of 03/22/2020      Reactions   Amoxicillin    Per daughter   Ampicillin    Per daughter   Depakote [valproic Acid]    DOES NOT WANT HER TO TAKE IT==Makes her hair fall out= per daughter who is poa   Macrobid [nitrofurantoin Macrocrystal] Rash   Per MAR   Penicillins    Per daughter   Acetaminophen Other (See Comments)   jaundice   Azithromycin Other (See Comments)   Per MAR   Butalbital    Caffeine    Chlorhexidine    Patient does not have central line or foley catheter   Doxycycline Other (See Comments)   Per MAR   Escitalopram Oxalate Other (See Comments)   Per MAR   Fioricet [  butalbital-apap-caffeine] Other (See Comments)   Drunk. Per MAR   Latex Other (See Comments)   Per MAR   Levofloxacin Other (See Comments)   Per MAR   Levofloxacin    Nsaids Other (See Comments)   This is not an allergy. The patient was told by Dr. Rockne Menghini to avoid NSAIDs and stop Vicoprofen in to 2014 to avoid nephrotoxicity. Per MAR.   Oxycodone Other (See Comments)   reaction to synthetic codeine Per MAR   Restasis [cyclosporine] Other (See Comments)   Per MAR   Sulfa Antibiotics Other (See Comments)   Per MAR   Biaxin [clarithromycin] Rash   With burning sensation Per [facility]      Medication List       Accurate as of March 22, 2020  3:36 PM. If you have any questions, ask your nurse or doctor.        STOP taking these medications   dorzolamide-timolol 22.3-6.8 MG/ML ophthalmic solution Commonly known as: COSOPT Stopped by: Yvonna Alanis, NP   latanoprost 0.005 % ophthalmic solution Commonly known as: XALATAN Stopped by: Yvonna Alanis, NP     TAKE these medications   acetaminophen 500 MG tablet Commonly known as: TYLENOL Give 2 tablets ( 1,000 mg total ) by mouth nightly for pain   lidocaine 5 % Commonly known as: LIDODERM APPLY 1 PATCH TO ANT. LEFT THIGH DAILY LEAVE ON X  12 HOURS THEN REMOVE   NON FORMULARY Magic Cup with meals.   PARoxetine 10 MG tablet Commonly known as: PAXIL Take 20 mg by mouth daily.   Polyethyl Glycol-Propyl Glycol 0.4-0.3 % Soln Place 2 drops into both eyes every 2 (two) hours as needed (dry eyes).   QUEtiapine 100 MG tablet Commonly known as: SEROQUEL Take 100 mg by mouth at bedtime.   senna 8.6 MG Tabs tablet Commonly known as: SENOKOT Take 2 tablets by mouth daily.       Review of Systems  Unable to perform ROS: Dementia    Immunization History  Administered Date(s) Administered  . Influenza Split 11/13/2011  . Influenza-Unspecified 02/13/2008, 11/30/2013, 12/20/2016  . Moderna Sars-Covid-2 Vaccination 02/12/2019, 03/12/2019, 12/31/2019  . PPD Test 12/15/2015  . Pneumococcal Conjugate-13 10/16/2016  . Pneumococcal-Unspecified 02/13/2008  . Td 02/12/2009   Pertinent  Health Maintenance Due  Topic Date Due  . INFLUENZA VACCINE  09/13/2019  . DEXA SCAN  Completed  . PNA vac Low Risk Adult  Completed   Fall Risk  08/27/2017 08/22/2016  Falls in the past year? No Yes  Number falls in past yr: - 2 or more  Injury with Fall? - Yes  Comment - back of head   Functional Status Survey:    Vitals:   03/22/20 1518  BP: (!) 117/59  Pulse: 68  Resp: 18  Temp: 98.6 F (37 C)  Weight: 78 lb 6.4 oz (35.6 kg)  Height: 4\' 11"  (1.499 m)   Body mass index is 15.83 kg/m. Physical Exam Vitals reviewed.  Constitutional:      General: She is not in acute distress. HENT:     Head: Normocephalic.     Ears:     Comments: Unable to exam, patient became agitated    Nose: Nose normal.     Mouth/Throat:     Mouth: Mucous membranes are moist.  Eyes:     General:        Right eye: No discharge.        Left eye: No discharge.  Cardiovascular:  Rate and Rhythm: Normal rate and regular rhythm.     Pulses: Normal pulses.     Heart sounds: Normal heart sounds. No murmur heard.   Pulmonary:     Effort:  Pulmonary effort is normal. No respiratory distress.     Breath sounds: Normal breath sounds. No wheezing.  Abdominal:     General: Abdomen is flat. Bowel sounds are normal. There is no distension.     Palpations: Abdomen is soft.     Tenderness: There is no abdominal tenderness.  Musculoskeletal:     Right lower leg: No edema.     Left lower leg: No edema.  Skin:    General: Skin is warm and dry.     Capillary Refill: Capillary refill takes less than 2 seconds.     Comments: Scattered scabbing on BUE- almost healed  Neurological:     General: No focal deficit present.     Mental Status: She is alert. Mental status is at baseline.     Motor: Weakness present.     Gait: Gait abnormal.  Psychiatric:        Mood and Affect: Mood normal.        Behavior: Behavior normal.     Labs reviewed: Recent Labs    08/12/19 1944 08/13/19 0828 08/16/19 1601 08/17/19 0452 08/18/19 0415 08/25/19 0000 01/18/20 0000  NA 152*   < > 145 144 144 141 143  K 3.5   < > 3.2* 3.8 4.2 4.3 4.0  CL 112*   < > 110 109 108 105 106  CO2 24   < > 27 29 26 21  23*  GLUCOSE 102*   < > 106* 105* 94  --   --   BUN 45*   < > 23 19 14  26* 17  CREATININE 1.70*   < > 1.11* 1.09* 1.01* 1.0 0.5  CALCIUM 12.1*   < > 10.3 10.8* 10.4* 11.0* 9.7  MG 2.5*  --   --   --   --   --   --   PHOS 3.3  --   --   --   --   --   --    < > = values in this interval not displayed.   Recent Labs    08/14/19 0917 08/17/19 0452 08/18/19 0415 01/18/20 0000  AST 21 18 18 25   ALT 17 16 15 15   ALKPHOS 77 72 78 130*  BILITOT 0.4 0.5 0.3  --   PROT 7.4 6.4* 6.5  --   ALBUMIN 3.8 3.3* 3.3* 4.1   Recent Labs    03/27/19 0000 07/15/19 0000 08/12/19 1944 08/13/19 0828 08/15/19 0348 08/16/19 0646 08/17/19 0452 08/25/19 0000  WBC 6.4   < > 9.6   < > 6.7 6.9 11.9* 5.6  NEUTROABS 3  --  6.6  --   --   --   --   --   HGB 13.1   < > 11.5*   < > 10.4* 10.8* 11.0* 11.1*  HCT 39   < > 37.2   < > 32.9* 34.5* 35.9* 34*  MCV  --    --  93.0   < > 91.4 92.7 93.2  --   PLT 218   < > 386   < > 299 264 278 248   < > = values in this interval not displayed.   Lab Results  Component Value Date   TSH 0.59 07/31/2017   Lab Results  Component  Value Date   HGBA1C 5.4 06/11/2016   Lab Results  Component Value Date   CHOL 134 07/31/2017   HDL 54 06/11/2016   LDLCALC 61 07/31/2017   TRIG 102 07/31/2017    Significant Diagnostic Results in last 30 days:  No results found.  Assessment/Plan 1. Late onset Alzheimer's disease with behavioral disturbance ([place]) - dependent with all ADL's - no recent behavioral outbursts - cont Seroquel  - continue skilled nursing   2. Stage 3 chronic kidney disease, unspecified whether stage 3a or 3b CKD ([place]) - continue to avoid nephrotoxic medications like NSAIDS, dose adjust medications to be renally excreted  3. Chronic obstructive pulmonary disease, unspecified COPD type ([place]) - stable, not requiring oxygen  4. Adult failure to thrive - continues to lose weight due to advanced dementia - continue Hospice care  5. Skin picking habit - Paxil increased to 20 mg po daily - small scabs observed on arms  6. Slow transit constipation - stable with senna daily    Family/ staff Communication: plan discussed with facility nurse  Labs/tests ordered:  none

## 2020-04-04 ENCOUNTER — Encounter: Payer: Self-pay | Admitting: Internal Medicine

## 2020-04-20 ENCOUNTER — Encounter: Payer: Self-pay | Admitting: Orthopedic Surgery

## 2020-04-20 ENCOUNTER — Non-Acute Institutional Stay (SKILLED_NURSING_FACILITY): Payer: Medicare Other | Admitting: Orthopedic Surgery

## 2020-04-20 DIAGNOSIS — G301 Alzheimer's disease with late onset: Secondary | ICD-10-CM | POA: Diagnosis not present

## 2020-04-20 DIAGNOSIS — R627 Adult failure to thrive: Secondary | ICD-10-CM

## 2020-04-20 DIAGNOSIS — F0281 Dementia in other diseases classified elsewhere with behavioral disturbance: Secondary | ICD-10-CM

## 2020-04-20 DIAGNOSIS — N183 Chronic kidney disease, stage 3 unspecified: Secondary | ICD-10-CM | POA: Diagnosis not present

## 2020-04-20 DIAGNOSIS — J449 Chronic obstructive pulmonary disease, unspecified: Secondary | ICD-10-CM | POA: Diagnosis not present

## 2020-04-20 DIAGNOSIS — F424 Excoriation (skin-picking) disorder: Secondary | ICD-10-CM

## 2020-04-20 DIAGNOSIS — K5901 Slow transit constipation: Secondary | ICD-10-CM

## 2020-04-20 DIAGNOSIS — F02818 Dementia in other diseases classified elsewhere, unspecified severity, with other behavioral disturbance: Secondary | ICD-10-CM

## 2020-04-20 NOTE — Progress Notes (Signed)
Location:  [facility] Room Number: 304/D Place of Service:  SNF [phone]) Provider: Windell Moulding, AGNP-C  Gayland Curry, DO  Patient Care Team: Gayland Curry, DO as PCP - General (Geriatric Medicine) [name], MD (Gastroenterology) [name], NP (Obstetrics and Gynecology) Rehab, [facility] Gonzalez (Mobile)  Extended Emergency Contact Information Primary Emergency Contact: [name] [place] of [place] Phone: [phone] Mobile Phone: [phone] Relation: Daughter Secondary Emergency Contact: Gwyndolyn Kaufman [place] of [place] Phone: [phone] Mobile Phone: [phone] Relation: Sister  Code Status: DNR Goals of care: Advanced Directive information Advanced Directives 03/22/2020  Does Patient Have a Medical Advance Directive? Yes  Type of Paramedic of [place];Out of facility DNR (pink MOST or yellow form)  Does patient want to make changes to medical advance directive? No - Patient declined  Copy of [place] in Chart? Yes - validated most recent copy scanned in chart (See row information)  Would patient like information on creating a medical advance directive? -  Pre-existing out of facility DNR order (yellow form or pink MOST form) Yellow form placed in chart (order not valid for inpatient use)     Chief Complaint  Patient presents with  . Medical Management of Chronic Issues    HPI:  Pt is a 87 y.o. female seen today for medical management of chronic diseases.    She is a resident of [facility], seen today. PMH includes: hypotension, hypertension, COPD, GERD, dysphagia, CKD stage III, advanced dementia and failure to thrive.   Today, she is in her wheelchair. Remains alert to self and recognizes daughter. Continues to be followed by Hospice. Today, she is very talkative. She will say one worded  responses like "yes," " yes please," " goodbye." Facility nurse reports skin picking has moved to her scalp. She has a small bald spot on her hairline. No skin picking on her extremities at this time. Continues to feed self, has lunch and dinner in dinning hall for most meals. Tolerating pureed diet and honey thick liquids.   No recent falls, injuries, aspirations or outbursts.   Recent blood pressures are as follows:  03/08- 122/62  03/07- 100/58  03/06- 115/71  Recent weights are as follows:  02/09- 76.2 lbs  01/13- 78.4 lbs  12/15- 80.2 lbs  Facility nurse does not report any other concerns, vitals stable.    Past Medical History:  Diagnosis Date  . Anxiety   . Arterial tortuosity (aorta) 12/11/2012  . Asthma   . Chronic kidney disease, stage IV (severe) ([place]) 12/11/2012  . Chronic mental illness   . CKD (chronic kidney disease), stage III ([place]) 01/14/2015  . Closed hip fracture requiring operative repair, right, with routine healing, subsequent encounter 12/19/2015  . Colitis   . COPD (chronic obstructive pulmonary disease) ([place])   . Decreased rectal sphincter tone 11/21/2012  . Depression   . Drug-seeking behavior 02/26/2011   Use of multiple md to obtain benzos/narcotics pt must sign contract to receive controlled meds.   Marland Kitchen Dysphagia 01/11/2015  . GERD (gastroesophageal reflux disease)   . Glaucoma 06/26/2011  . Glaucoma of both eyes   . Hearing loss, sensorineural 03/04/2014   [facility]; 02/26/14. There is not a medical or surgical treatment that would benefit her type of hearing loss. We discussed amplification in an overview fashion.    Marland Kitchen History of fall   . Insomnia,  unspecified   . Macular degeneration 06/26/2011  . Migraines   . Nasal bone fracture 03/30/2014  . Osteoporosis   . Osteoporosis 02/26/2011  . Presence of right artificial hip joint   . Protein-calorie malnutrition, severe ([place]) 12/10/2012  . S/P ORIF (open reduction internal fixation) fracture  01/08/2016  . Schatzki's ring 01/14/2015  . Scoliosis   . Substance abuse ([place])    Benbzos and Narcotics, she does not get any of those medicines from [facility], we have empathetically told her that.  . Vertigo   . Vitamin B deficiency   . Vitamin D deficiency 08/25/2016  . Weight loss    Past Surgical History:  Procedure Laterality Date  . ABDOMINAL HYSTERECTOMY    . CATARACT EXTRACTION    . CHOLECYSTECTOMY    . ESOPHAGOGASTRODUODENOSCOPY (EGD) WITH PROPOFOL N/A 01/13/2015   Procedure: ESOPHAGOGASTRODUODENOSCOPY (EGD) WITH PROPOFOL;  Surgeon: [name], MD;  Location: [facility];  Service: Endoscopy;  Laterality: N/A;  . HIP ARTHROPLASTY Right 12/21/2015   Procedure: ARTHROPLASTY BIPOLAR HIP (HEMIARTHROPLASTY);  Surgeon: [name], MD;  Location: [facility];  Service: Orthopedics;  Laterality: Right;  . PARTIAL HYSTERECTOMY      Allergies  Allergen Reactions  . Amoxicillin     Per daughter  . Ampicillin     Per daughter  . Depakote [Valproic Acid]     DOES NOT WANT HER TO TAKE IT==Makes her hair fall out= per daughter who is poa  . Macrobid [Nitrofurantoin Macrocrystal] Rash    Per MAR  . Penicillins     Per daughter  . Acetaminophen Other (See Comments)    jaundice  . Azithromycin Other (See Comments)    Per MAR  . Butalbital   . Caffeine   . Chlorhexidine     Patient does not have central line or foley catheter  . Doxycycline Other (See Comments)    Per MAR  . Escitalopram Oxalate Other (See Comments)    Per MAR  . Fioricet [Butalbital-Apap-Caffeine] Other (See Comments)    Drunk. Per MAR  . Latex Other (See Comments)    Per MAR  . Levofloxacin Other (See Comments)    Per MAR  . Levofloxacin   . Nsaids Other (See Comments)    This is not an allergy. The patient was told by Dr. Rockne Menghini to avoid NSAIDs and stop Vicoprofen in to 2014 to avoid nephrotoxicity.  Per MAR.  Marland Kitchen Oxycodone Other (See Comments)    reaction to synthetic codeine Per MAR  . Restasis  [Cyclosporine] Other (See Comments)    Per MAR  . Sulfa Antibiotics Other (See Comments)    Per MAR  . Biaxin [Clarithromycin] Rash    With burning sensation Per [facility]    Outpatient Encounter Medications as of 04/20/2020  Medication Sig  . acetaminophen (TYLENOL) 500 MG tablet Give 2 tablets ( 1,000 mg total ) by mouth nightly for pain  . lidocaine (LIDODERM) 5 % APPLY 1 PATCH TO ANT. LEFT THIGH DAILY LEAVE ON X 12 HOURS THEN REMOVE  . NON FORMULARY Magic Cup with meals.  Marland Kitchen PARoxetine (PAXIL) 10 MG tablet Take 20 mg by mouth daily.  Vladimir Faster Glycol-Propyl Glycol 0.4-0.3 % SOLN Place 2 drops into both eyes every 2 (two) hours as needed (dry eyes).   . QUEtiapine (SEROQUEL) 100 MG tablet Take 100 mg by mouth at bedtime.  . senna (SENOKOT) 8.6 MG TABS tablet Take 2 tablets by mouth daily.   No facility-administered encounter medications on file  as of 04/20/2020.    Review of Systems  Unable to perform ROS: Dementia    Immunization History  Administered Date(s) Administered  . Influenza Split 11/13/2011  . Influenza-Unspecified 02/13/2008, 11/30/2013, 12/20/2016  . Moderna Sars-Covid-2 Vaccination 02/12/2019, 03/12/2019, 12/31/2019  . PPD Test 12/15/2015  . Pneumococcal Conjugate-13 10/16/2016  . Pneumococcal-Unspecified 02/13/2008  . Td 02/12/2009   Pertinent  Health Maintenance Due  Topic Date Due  . INFLUENZA VACCINE  09/13/2019  . DEXA SCAN  Completed  . PNA vac Low Risk Adult  Completed   Fall Risk  08/27/2017 08/22/2016  Falls in the past year? No Yes  Number falls in past yr: - 2 or more  Injury with Fall? - Yes  Comment - back of head   Functional Status Survey:    Vitals:   04/20/20 1548  BP: 101/69  Pulse: 69  Resp: 17  Temp: (!) 97.3 F (36.3 C)  Weight: 76 lb 3.2 oz (34.6 kg)   Body mass index is 15.39 kg/m. Physical Exam Vitals reviewed.  Constitutional:      General: She is not in acute distress. HENT:     Head: Normocephalic.     Comments:  Dime-sized bald spot on hairline near forehead. No skin breakdown or drainage.     Ears:     Comments: Unable to examine    Nose: Nose normal.     Mouth/Throat:     Mouth: Mucous membranes are moist.     Comments: Teeth missing Eyes:     General:        Right eye: No discharge.        Left eye: No discharge.  Cardiovascular:     Rate and Rhythm: Normal rate and regular rhythm.     Pulses: Normal pulses.     Heart sounds: Normal heart sounds. No murmur heard.   Pulmonary:     Effort: Pulmonary effort is normal. No respiratory distress.     Breath sounds: Normal breath sounds. No wheezing.  Abdominal:     General: Abdomen is flat. Bowel sounds are normal. There is no distension.     Palpations: Abdomen is soft.     Tenderness: There is no abdominal tenderness.  Musculoskeletal:     Cervical back: Normal range of motion.     Right lower leg: No edema.     Left lower leg: No edema.  Lymphadenopathy:     Cervical: No cervical adenopathy.  Skin:    General: Skin is warm and dry.     Capillary Refill: Capillary refill takes less than 2 seconds.  Neurological:     General: No focal deficit present.     Mental Status: She is alert. Mental status is at baseline.     Motor: Weakness present.     Gait: Gait abnormal.     Comments: wheelchair  Psychiatric:        Mood and Affect: Mood normal.        Behavior: Behavior normal.        Cognition and Memory: Memory is impaired.     Labs reviewed: Recent Labs    08/12/19 1944 08/13/19 0828 08/16/19 1601 08/17/19 0452 08/18/19 0415 08/25/19 0000 01/18/20 0000  NA 152*   < > 145 144 144 141 143  K 3.5   < > 3.2* 3.8 4.2 4.3 4.0  CL 112*   < > 110 109 108 105 106  CO2 24   < > 27 29 26 21  23*  GLUCOSE 102*   < > 106* 105* 94  --   --   BUN 45*   < > 23 19 14  26* 17  CREATININE 1.70*   < > 1.11* 1.09* 1.01* 1.0 0.5  CALCIUM 12.1*   < > 10.3 10.8* 10.4* 11.0* 9.7  MG 2.5*  --   --   --   --   --   --   PHOS 3.3  --   --   --    --   --   --    < > = values in this interval not displayed.   Recent Labs    08/14/19 0917 08/17/19 0452 08/18/19 0415 01/18/20 0000  AST 21 18 18 25   ALT 17 16 15 15   ALKPHOS 77 72 78 130*  BILITOT 0.4 0.5 0.3  --   PROT 7.4 6.4* 6.5  --   ALBUMIN 3.8 3.3* 3.3* 4.1   Recent Labs    08/12/19 1944 08/13/19 0828 08/15/19 0348 08/16/19 0646 08/17/19 0452 08/25/19 0000  WBC 9.6   < > 6.7 6.9 11.9* 5.6  NEUTROABS 6.6  --   --   --   --   --   HGB 11.5*   < > 10.4* 10.8* 11.0* 11.1*  HCT 37.2   < > 32.9* 34.5* 35.9* 34*  MCV 93.0   < > 91.4 92.7 93.2  --   PLT 386   < > 299 264 278 248   < > = values in this interval not displayed.   Lab Results  Component Value Date   TSH 0.59 07/31/2017   Lab Results  Component Value Date   HGBA1C 5.4 06/11/2016   Lab Results  Component Value Date   CHOL 134 07/31/2017   HDL 54 06/11/2016   LDLCALC 61 07/31/2017   TRIG 102 07/31/2017    Significant Diagnostic Results in last 30 days:  No results found.  Assessment/Plan 1. Late onset Alzheimer's disease with behavioral disturbance ([place]) - in advanced stages, able to feed self - remains dependent with all ADLs - no behavioral outbursts - cont seroquel and skilled nursing  2. Stage 3 chronic kidney disease, unspecified whether stage 3a or 3b CKD ([place]) - continue to avoid nephrotoxic drugs like NSAIDS and dose adjust medications to be renally excreted - encourage hydration  3. Chronic obstructive pulmonary disease, unspecified COPD type ([place]) - stable without oxygen  4. Adult failure to thrive - continues to lose weight, down 2 lbs from last month - cont magic cups - cont hospice service  5. Skin picking habit - has moved from upper extremities to scalp - small bald spot over hairline - cont to monitor  6. Slow transit constipation - stable with senna - encourage hydration    Family/ staff Communication: Plan discussed with facility nurse  Labs/tests  ordered: none
